# Patient Record
Sex: Male | Born: 1950 | Race: White | Hispanic: No | Marital: Married | Smoking: Former smoker
Health system: Southern US, Community
[De-identification: ages and names within clinical notes are randomized; demographics above are authoritative.]

## PROBLEM LIST (undated history)

## (undated) DIAGNOSIS — F329 Major depressive disorder, single episode, unspecified: Secondary | ICD-10-CM

## (undated) DIAGNOSIS — F32A Depression, unspecified: Secondary | ICD-10-CM

## (undated) DIAGNOSIS — E785 Hyperlipidemia, unspecified: Secondary | ICD-10-CM

## (undated) DIAGNOSIS — E119 Type 2 diabetes mellitus without complications: Secondary | ICD-10-CM

## (undated) DIAGNOSIS — G35 Multiple sclerosis: Secondary | ICD-10-CM

## (undated) DIAGNOSIS — G8929 Other chronic pain: Secondary | ICD-10-CM

## (undated) DIAGNOSIS — K219 Gastro-esophageal reflux disease without esophagitis: Secondary | ICD-10-CM

## (undated) DIAGNOSIS — I10 Essential (primary) hypertension: Secondary | ICD-10-CM

## (undated) HISTORY — DX: Hyperlipidemia, unspecified: E78.5

## (undated) HISTORY — DX: Type 2 diabetes mellitus without complications: E11.9

## (undated) HISTORY — DX: Other chronic pain: G89.29

## (undated) HISTORY — DX: Depression, unspecified: F32.A

## (undated) HISTORY — DX: Essential (primary) hypertension: I10

## (undated) HISTORY — DX: Major depressive disorder, single episode, unspecified: F32.9

## (undated) HISTORY — DX: Multiple sclerosis: G35

## (undated) HISTORY — DX: Gastro-esophageal reflux disease without esophagitis: K21.9

## (undated) MED FILL — Iron Sucrose Inj 20 MG/ML (Fe Equiv): INTRAVENOUS | Qty: 10 | Status: AC

---

## 2005-01-22 ENCOUNTER — Ambulatory Visit: Payer: Self-pay | Admitting: Family Medicine

## 2005-02-02 ENCOUNTER — Ambulatory Visit: Payer: Self-pay | Admitting: Family Medicine

## 2005-11-17 ENCOUNTER — Ambulatory Visit: Payer: Self-pay | Admitting: Psychiatry

## 2005-11-18 ENCOUNTER — Ambulatory Visit: Payer: Self-pay | Admitting: Psychiatry

## 2006-05-17 ENCOUNTER — Ambulatory Visit: Payer: Self-pay | Admitting: Psychiatry

## 2008-06-18 ENCOUNTER — Ambulatory Visit: Payer: Self-pay | Admitting: Gastroenterology

## 2008-06-25 ENCOUNTER — Other Ambulatory Visit: Payer: Self-pay

## 2008-06-25 ENCOUNTER — Inpatient Hospital Stay: Payer: Self-pay | Admitting: Internal Medicine

## 2008-06-25 ENCOUNTER — Ambulatory Visit: Payer: Self-pay | Admitting: Internal Medicine

## 2008-06-25 HISTORY — PX: COLECTOMY: SHX59

## 2008-07-25 ENCOUNTER — Ambulatory Visit: Payer: Self-pay | Admitting: Internal Medicine

## 2010-08-10 ENCOUNTER — Ambulatory Visit: Payer: Self-pay | Admitting: Nephrology

## 2010-10-14 ENCOUNTER — Inpatient Hospital Stay: Payer: Self-pay | Admitting: Internal Medicine

## 2011-11-09 DIAGNOSIS — D510 Vitamin B12 deficiency anemia due to intrinsic factor deficiency: Secondary | ICD-10-CM | POA: Diagnosis not present

## 2011-11-23 DIAGNOSIS — D509 Iron deficiency anemia, unspecified: Secondary | ICD-10-CM | POA: Diagnosis not present

## 2011-12-03 DIAGNOSIS — R269 Unspecified abnormalities of gait and mobility: Secondary | ICD-10-CM | POA: Diagnosis not present

## 2011-12-03 DIAGNOSIS — R609 Edema, unspecified: Secondary | ICD-10-CM | POA: Diagnosis not present

## 2011-12-03 DIAGNOSIS — G35 Multiple sclerosis: Secondary | ICD-10-CM | POA: Diagnosis not present

## 2011-12-07 DIAGNOSIS — D509 Iron deficiency anemia, unspecified: Secondary | ICD-10-CM | POA: Diagnosis not present

## 2011-12-17 DIAGNOSIS — D509 Iron deficiency anemia, unspecified: Secondary | ICD-10-CM | POA: Diagnosis not present

## 2011-12-17 DIAGNOSIS — R197 Diarrhea, unspecified: Secondary | ICD-10-CM | POA: Diagnosis not present

## 2011-12-17 DIAGNOSIS — E860 Dehydration: Secondary | ICD-10-CM | POA: Diagnosis not present

## 2011-12-21 DIAGNOSIS — D509 Iron deficiency anemia, unspecified: Secondary | ICD-10-CM | POA: Diagnosis not present

## 2012-01-04 DIAGNOSIS — D509 Iron deficiency anemia, unspecified: Secondary | ICD-10-CM | POA: Diagnosis not present

## 2012-01-18 DIAGNOSIS — D509 Iron deficiency anemia, unspecified: Secondary | ICD-10-CM | POA: Diagnosis not present

## 2012-02-01 DIAGNOSIS — D509 Iron deficiency anemia, unspecified: Secondary | ICD-10-CM | POA: Diagnosis not present

## 2012-02-15 DIAGNOSIS — D509 Iron deficiency anemia, unspecified: Secondary | ICD-10-CM | POA: Diagnosis not present

## 2012-02-21 DIAGNOSIS — H4010X0 Unspecified open-angle glaucoma, stage unspecified: Secondary | ICD-10-CM | POA: Diagnosis not present

## 2012-02-29 DIAGNOSIS — D509 Iron deficiency anemia, unspecified: Secondary | ICD-10-CM | POA: Diagnosis not present

## 2012-03-14 DIAGNOSIS — E119 Type 2 diabetes mellitus without complications: Secondary | ICD-10-CM | POA: Diagnosis not present

## 2012-03-14 DIAGNOSIS — E785 Hyperlipidemia, unspecified: Secondary | ICD-10-CM | POA: Diagnosis not present

## 2012-03-14 DIAGNOSIS — F329 Major depressive disorder, single episode, unspecified: Secondary | ICD-10-CM | POA: Diagnosis not present

## 2012-03-14 DIAGNOSIS — D509 Iron deficiency anemia, unspecified: Secondary | ICD-10-CM | POA: Diagnosis not present

## 2012-03-14 DIAGNOSIS — I10 Essential (primary) hypertension: Secondary | ICD-10-CM | POA: Diagnosis not present

## 2012-03-28 DIAGNOSIS — D509 Iron deficiency anemia, unspecified: Secondary | ICD-10-CM | POA: Diagnosis not present

## 2012-04-11 DIAGNOSIS — D509 Iron deficiency anemia, unspecified: Secondary | ICD-10-CM | POA: Diagnosis not present

## 2012-04-25 DIAGNOSIS — D509 Iron deficiency anemia, unspecified: Secondary | ICD-10-CM | POA: Diagnosis not present

## 2012-05-10 DIAGNOSIS — S2239XA Fracture of one rib, unspecified side, initial encounter for closed fracture: Secondary | ICD-10-CM | POA: Diagnosis not present

## 2012-05-23 DIAGNOSIS — D510 Vitamin B12 deficiency anemia due to intrinsic factor deficiency: Secondary | ICD-10-CM | POA: Diagnosis not present

## 2012-05-26 DIAGNOSIS — R269 Unspecified abnormalities of gait and mobility: Secondary | ICD-10-CM | POA: Diagnosis not present

## 2012-05-26 DIAGNOSIS — R609 Edema, unspecified: Secondary | ICD-10-CM | POA: Diagnosis not present

## 2012-05-26 DIAGNOSIS — G35 Multiple sclerosis: Secondary | ICD-10-CM | POA: Diagnosis not present

## 2012-05-29 ENCOUNTER — Other Ambulatory Visit: Payer: Self-pay | Admitting: Neurology

## 2012-05-29 DIAGNOSIS — G35 Multiple sclerosis: Secondary | ICD-10-CM

## 2012-05-29 DIAGNOSIS — R609 Edema, unspecified: Secondary | ICD-10-CM

## 2012-05-29 DIAGNOSIS — R269 Unspecified abnormalities of gait and mobility: Secondary | ICD-10-CM

## 2012-06-02 ENCOUNTER — Ambulatory Visit
Admission: RE | Admit: 2012-06-02 | Discharge: 2012-06-02 | Disposition: A | Discharge status: Home / Self Care | Payer: Medicare Other | Source: Ambulatory Visit | Attending: Neurology | Admitting: Neurology

## 2012-06-02 DIAGNOSIS — R609 Edema, unspecified: Secondary | ICD-10-CM

## 2012-06-02 DIAGNOSIS — G35 Multiple sclerosis: Secondary | ICD-10-CM

## 2012-06-02 DIAGNOSIS — R269 Unspecified abnormalities of gait and mobility: Secondary | ICD-10-CM

## 2012-06-02 DIAGNOSIS — R262 Difficulty in walking, not elsewhere classified: Secondary | ICD-10-CM | POA: Diagnosis not present

## 2012-06-02 MED ORDER — GADOBENATE DIMEGLUMINE 529 MG/ML IV SOLN
20.0000 mL | Freq: Once | INTRAVENOUS | Status: AC | PRN
  Administered 2012-06-02: 20 mL via INTRAVENOUS

## 2012-06-06 DIAGNOSIS — E538 Deficiency of other specified B group vitamins: Secondary | ICD-10-CM | POA: Diagnosis not present

## 2012-06-20 DIAGNOSIS — E538 Deficiency of other specified B group vitamins: Secondary | ICD-10-CM | POA: Diagnosis not present

## 2012-07-04 DIAGNOSIS — E538 Deficiency of other specified B group vitamins: Secondary | ICD-10-CM | POA: Diagnosis not present

## 2012-07-11 DIAGNOSIS — R609 Edema, unspecified: Secondary | ICD-10-CM | POA: Diagnosis not present

## 2012-07-11 DIAGNOSIS — D649 Anemia, unspecified: Secondary | ICD-10-CM | POA: Diagnosis not present

## 2012-07-11 DIAGNOSIS — E875 Hyperkalemia: Secondary | ICD-10-CM | POA: Diagnosis not present

## 2012-07-18 DIAGNOSIS — E538 Deficiency of other specified B group vitamins: Secondary | ICD-10-CM | POA: Diagnosis not present

## 2012-07-27 DIAGNOSIS — R269 Unspecified abnormalities of gait and mobility: Secondary | ICD-10-CM | POA: Diagnosis not present

## 2012-07-27 DIAGNOSIS — G35 Multiple sclerosis: Secondary | ICD-10-CM | POA: Diagnosis not present

## 2012-07-31 DIAGNOSIS — D509 Iron deficiency anemia, unspecified: Secondary | ICD-10-CM | POA: Diagnosis not present

## 2012-07-31 DIAGNOSIS — J209 Acute bronchitis, unspecified: Secondary | ICD-10-CM | POA: Diagnosis not present

## 2012-08-15 DIAGNOSIS — Z23 Encounter for immunization: Secondary | ICD-10-CM | POA: Diagnosis not present

## 2012-08-15 DIAGNOSIS — D510 Vitamin B12 deficiency anemia due to intrinsic factor deficiency: Secondary | ICD-10-CM | POA: Diagnosis not present

## 2012-08-29 DIAGNOSIS — D509 Iron deficiency anemia, unspecified: Secondary | ICD-10-CM | POA: Diagnosis not present

## 2012-09-12 ENCOUNTER — Ambulatory Visit: Payer: Self-pay | Admitting: Family Medicine

## 2012-09-12 DIAGNOSIS — S2249XA Multiple fractures of ribs, unspecified side, initial encounter for closed fracture: Secondary | ICD-10-CM | POA: Diagnosis not present

## 2012-09-12 DIAGNOSIS — S2239XA Fracture of one rib, unspecified side, initial encounter for closed fracture: Secondary | ICD-10-CM | POA: Diagnosis not present

## 2012-09-12 DIAGNOSIS — Z043 Encounter for examination and observation following other accident: Secondary | ICD-10-CM | POA: Diagnosis not present

## 2012-09-26 DIAGNOSIS — D509 Iron deficiency anemia, unspecified: Secondary | ICD-10-CM | POA: Diagnosis not present

## 2012-10-09 DIAGNOSIS — H40009 Preglaucoma, unspecified, unspecified eye: Secondary | ICD-10-CM | POA: Diagnosis not present

## 2012-10-10 DIAGNOSIS — D509 Iron deficiency anemia, unspecified: Secondary | ICD-10-CM | POA: Diagnosis not present

## 2012-10-23 DIAGNOSIS — J209 Acute bronchitis, unspecified: Secondary | ICD-10-CM | POA: Diagnosis not present

## 2012-10-23 DIAGNOSIS — J329 Chronic sinusitis, unspecified: Secondary | ICD-10-CM | POA: Diagnosis not present

## 2012-10-23 DIAGNOSIS — D509 Iron deficiency anemia, unspecified: Secondary | ICD-10-CM | POA: Diagnosis not present

## 2012-11-07 DIAGNOSIS — D509 Iron deficiency anemia, unspecified: Secondary | ICD-10-CM | POA: Diagnosis not present

## 2012-11-21 DIAGNOSIS — D509 Iron deficiency anemia, unspecified: Secondary | ICD-10-CM | POA: Diagnosis not present

## 2012-12-19 DIAGNOSIS — D509 Iron deficiency anemia, unspecified: Secondary | ICD-10-CM | POA: Diagnosis not present

## 2013-01-16 DIAGNOSIS — D509 Iron deficiency anemia, unspecified: Secondary | ICD-10-CM | POA: Diagnosis not present

## 2013-01-23 DIAGNOSIS — J329 Chronic sinusitis, unspecified: Secondary | ICD-10-CM | POA: Diagnosis not present

## 2013-01-30 DIAGNOSIS — E538 Deficiency of other specified B group vitamins: Secondary | ICD-10-CM | POA: Diagnosis not present

## 2013-02-13 DIAGNOSIS — E538 Deficiency of other specified B group vitamins: Secondary | ICD-10-CM | POA: Diagnosis not present

## 2013-02-27 DIAGNOSIS — E538 Deficiency of other specified B group vitamins: Secondary | ICD-10-CM | POA: Diagnosis not present

## 2013-03-13 DIAGNOSIS — E538 Deficiency of other specified B group vitamins: Secondary | ICD-10-CM | POA: Diagnosis not present

## 2013-03-22 DIAGNOSIS — L03119 Cellulitis of unspecified part of limb: Secondary | ICD-10-CM | POA: Diagnosis not present

## 2013-03-22 DIAGNOSIS — L02419 Cutaneous abscess of limb, unspecified: Secondary | ICD-10-CM | POA: Diagnosis not present

## 2013-03-22 DIAGNOSIS — L259 Unspecified contact dermatitis, unspecified cause: Secondary | ICD-10-CM | POA: Diagnosis not present

## 2013-03-26 DIAGNOSIS — L03119 Cellulitis of unspecified part of limb: Secondary | ICD-10-CM | POA: Diagnosis not present

## 2013-04-09 DIAGNOSIS — H40009 Preglaucoma, unspecified, unspecified eye: Secondary | ICD-10-CM | POA: Diagnosis not present

## 2013-04-10 DIAGNOSIS — D509 Iron deficiency anemia, unspecified: Secondary | ICD-10-CM | POA: Diagnosis not present

## 2013-04-12 DIAGNOSIS — E785 Hyperlipidemia, unspecified: Secondary | ICD-10-CM | POA: Diagnosis not present

## 2013-04-12 DIAGNOSIS — E538 Deficiency of other specified B group vitamins: Secondary | ICD-10-CM | POA: Diagnosis not present

## 2013-04-12 DIAGNOSIS — F329 Major depressive disorder, single episode, unspecified: Secondary | ICD-10-CM | POA: Diagnosis not present

## 2013-04-12 DIAGNOSIS — D509 Iron deficiency anemia, unspecified: Secondary | ICD-10-CM | POA: Diagnosis not present

## 2013-04-12 DIAGNOSIS — E119 Type 2 diabetes mellitus without complications: Secondary | ICD-10-CM | POA: Diagnosis not present

## 2013-04-12 DIAGNOSIS — I10 Essential (primary) hypertension: Secondary | ICD-10-CM | POA: Diagnosis not present

## 2013-04-17 DIAGNOSIS — D649 Anemia, unspecified: Secondary | ICD-10-CM | POA: Diagnosis not present

## 2013-04-17 DIAGNOSIS — N182 Chronic kidney disease, stage 2 (mild): Secondary | ICD-10-CM | POA: Diagnosis not present

## 2013-04-17 DIAGNOSIS — R609 Edema, unspecified: Secondary | ICD-10-CM | POA: Diagnosis not present

## 2013-04-17 DIAGNOSIS — I270 Primary pulmonary hypertension: Secondary | ICD-10-CM | POA: Diagnosis not present

## 2013-04-24 DIAGNOSIS — D509 Iron deficiency anemia, unspecified: Secondary | ICD-10-CM | POA: Diagnosis not present

## 2013-04-24 DIAGNOSIS — E538 Deficiency of other specified B group vitamins: Secondary | ICD-10-CM | POA: Diagnosis not present

## 2013-05-02 DIAGNOSIS — IMO0002 Reserved for concepts with insufficient information to code with codable children: Secondary | ICD-10-CM | POA: Diagnosis not present

## 2013-05-02 DIAGNOSIS — D509 Iron deficiency anemia, unspecified: Secondary | ICD-10-CM | POA: Diagnosis not present

## 2013-05-03 DIAGNOSIS — G479 Sleep disorder, unspecified: Secondary | ICD-10-CM | POA: Diagnosis not present

## 2013-05-08 DIAGNOSIS — E538 Deficiency of other specified B group vitamins: Secondary | ICD-10-CM | POA: Diagnosis not present

## 2013-05-10 DIAGNOSIS — D509 Iron deficiency anemia, unspecified: Secondary | ICD-10-CM | POA: Diagnosis not present

## 2013-05-14 DIAGNOSIS — E119 Type 2 diabetes mellitus without complications: Secondary | ICD-10-CM | POA: Diagnosis not present

## 2013-05-14 DIAGNOSIS — IMO0002 Reserved for concepts with insufficient information to code with codable children: Secondary | ICD-10-CM | POA: Diagnosis not present

## 2013-05-14 DIAGNOSIS — D509 Iron deficiency anemia, unspecified: Secondary | ICD-10-CM | POA: Diagnosis not present

## 2013-05-15 ENCOUNTER — Ambulatory Visit: Payer: Self-pay | Admitting: Gastroenterology

## 2013-05-15 DIAGNOSIS — F3289 Other specified depressive episodes: Secondary | ICD-10-CM | POA: Diagnosis not present

## 2013-05-15 DIAGNOSIS — Z8249 Family history of ischemic heart disease and other diseases of the circulatory system: Secondary | ICD-10-CM | POA: Diagnosis not present

## 2013-05-15 DIAGNOSIS — D649 Anemia, unspecified: Secondary | ICD-10-CM | POA: Diagnosis not present

## 2013-05-15 DIAGNOSIS — D509 Iron deficiency anemia, unspecified: Secondary | ICD-10-CM | POA: Diagnosis not present

## 2013-05-15 DIAGNOSIS — G35 Multiple sclerosis: Secondary | ICD-10-CM | POA: Diagnosis not present

## 2013-05-15 DIAGNOSIS — Z7982 Long term (current) use of aspirin: Secondary | ICD-10-CM | POA: Diagnosis not present

## 2013-05-15 DIAGNOSIS — K2991 Gastroduodenitis, unspecified, with bleeding: Secondary | ICD-10-CM | POA: Diagnosis not present

## 2013-05-15 DIAGNOSIS — K2970 Gastritis, unspecified, without bleeding: Secondary | ICD-10-CM | POA: Diagnosis not present

## 2013-05-15 DIAGNOSIS — E785 Hyperlipidemia, unspecified: Secondary | ICD-10-CM | POA: Diagnosis not present

## 2013-05-15 DIAGNOSIS — Z87891 Personal history of nicotine dependence: Secondary | ICD-10-CM | POA: Diagnosis not present

## 2013-05-15 DIAGNOSIS — E1149 Type 2 diabetes mellitus with other diabetic neurological complication: Secondary | ICD-10-CM | POA: Diagnosis not present

## 2013-05-15 DIAGNOSIS — Z79899 Other long term (current) drug therapy: Secondary | ICD-10-CM | POA: Diagnosis not present

## 2013-05-15 DIAGNOSIS — J309 Allergic rhinitis, unspecified: Secondary | ICD-10-CM | POA: Diagnosis not present

## 2013-05-15 DIAGNOSIS — Z9109 Other allergy status, other than to drugs and biological substances: Secondary | ICD-10-CM | POA: Diagnosis not present

## 2013-05-15 DIAGNOSIS — Z833 Family history of diabetes mellitus: Secondary | ICD-10-CM | POA: Diagnosis not present

## 2013-05-15 DIAGNOSIS — K648 Other hemorrhoids: Secondary | ICD-10-CM | POA: Diagnosis not present

## 2013-05-15 DIAGNOSIS — K2971 Gastritis, unspecified, with bleeding: Secondary | ICD-10-CM | POA: Diagnosis not present

## 2013-05-15 DIAGNOSIS — K449 Diaphragmatic hernia without obstruction or gangrene: Secondary | ICD-10-CM | POA: Diagnosis not present

## 2013-05-15 DIAGNOSIS — Z801 Family history of malignant neoplasm of trachea, bronchus and lung: Secondary | ICD-10-CM | POA: Diagnosis not present

## 2013-05-16 LAB — PATHOLOGY REPORT

## 2013-05-22 DIAGNOSIS — E538 Deficiency of other specified B group vitamins: Secondary | ICD-10-CM | POA: Diagnosis not present

## 2013-06-05 DIAGNOSIS — D509 Iron deficiency anemia, unspecified: Secondary | ICD-10-CM | POA: Diagnosis not present

## 2013-06-05 DIAGNOSIS — E538 Deficiency of other specified B group vitamins: Secondary | ICD-10-CM | POA: Diagnosis not present

## 2013-06-08 DIAGNOSIS — D61818 Other pancytopenia: Secondary | ICD-10-CM | POA: Diagnosis not present

## 2013-06-19 DIAGNOSIS — E538 Deficiency of other specified B group vitamins: Secondary | ICD-10-CM | POA: Diagnosis not present

## 2013-07-02 DIAGNOSIS — R609 Edema, unspecified: Secondary | ICD-10-CM | POA: Diagnosis not present

## 2013-07-02 DIAGNOSIS — E538 Deficiency of other specified B group vitamins: Secondary | ICD-10-CM | POA: Diagnosis not present

## 2013-07-02 DIAGNOSIS — J329 Chronic sinusitis, unspecified: Secondary | ICD-10-CM | POA: Diagnosis not present

## 2013-07-02 DIAGNOSIS — J45909 Unspecified asthma, uncomplicated: Secondary | ICD-10-CM | POA: Diagnosis not present

## 2013-07-17 DIAGNOSIS — E538 Deficiency of other specified B group vitamins: Secondary | ICD-10-CM | POA: Diagnosis not present

## 2013-07-17 DIAGNOSIS — D509 Iron deficiency anemia, unspecified: Secondary | ICD-10-CM | POA: Diagnosis not present

## 2013-07-21 ENCOUNTER — Encounter: Payer: Self-pay | Admitting: Nurse Practitioner

## 2013-07-23 ENCOUNTER — Other Ambulatory Visit: Payer: Self-pay | Admitting: Neurology

## 2013-07-25 NOTE — Telephone Encounter (Signed)
Rx signed and faxed.

## 2013-07-26 ENCOUNTER — Encounter: Payer: Self-pay | Admitting: Nurse Practitioner

## 2013-07-26 ENCOUNTER — Ambulatory Visit (INDEPENDENT_AMBULATORY_CARE_PROVIDER_SITE_OTHER): Payer: Medicare Other | Admitting: Nurse Practitioner

## 2013-07-26 VITALS — BP 132/63 | HR 69 | Ht 70.0 in | Wt 284.0 lb

## 2013-07-26 DIAGNOSIS — G35 Multiple sclerosis: Secondary | ICD-10-CM | POA: Insufficient documentation

## 2013-07-26 DIAGNOSIS — R269 Unspecified abnormalities of gait and mobility: Secondary | ICD-10-CM | POA: Insufficient documentation

## 2013-07-26 DIAGNOSIS — G35D Multiple sclerosis, unspecified: Secondary | ICD-10-CM | POA: Insufficient documentation

## 2013-07-26 NOTE — Progress Notes (Signed)
[facility]  PATIENT: Jeremy [name] DOB: Oct 15, 1951   REASON FOR VISIT: Followup for multiple sclerosis   HISTORY OF PRESENT ILLNESS:. Jeremy [name] is a 62 year old, right handed, Caucasian male with history of  diabetes, hypertension and multiple sclerosis.  The diagnosis of multiple sclerosis was confirmed by MRI study and a spinal fluid testing.  He was initially followed by Dr. Nash Shearer in 2008.Last seen in this office 07/27/12. He continues to have gait difficulties and relies on a cane for ambulation.  He has had no problems with his vision or bowel/bladder control.He has had occasional falls.He has ongoing pain in his legs. Last MS flare was over 2 years ago. He is currently on Betaseron every other day, treatment started in 2007. MRI last year  of cervical spine with findings consistent with MS plaques and no enhancement Mild spinal stenosis. Marland KitchenMRI of the brain with prominent white matter changes consistent with MS. No active areas of demyelination. He continues to have problems with low hemoglobin and is due to see a hematologist. Recent labs to include CBC and CMP at primary care   HISTORY: The initial symptom was left leg more than arm weakness, gait difficulty, with acute onset in 2006.  This eventually led to the diagnosis of relapsing remitting multiple sclerosis. He has been receiving Betaseron treatment since early 2007.  He has tolerated the medication well.  He has had no problems with lipoatrophy or skin necrosis. In September 2009, he developed GI bleeding.  Workup had demonstrated colon inflammation and obstruction.He underwent colectomy and recovered well from that procedure.  He continues to have gait difficulties and relies on a cane for ambulation.  He has had no problems with his vision or bowel/bladder control. He does have chronic extremity pain, which has been managed with Lyrica and Tramadol. He uses an Art gallery manager for situations in which he would need  to walk long distances. He has had occasional falls.He has ongoing pain in his legs.Last flare up was 13 months ago, He had increased gait difficulty then, he is now back to his baseline, functional level has been fairly stable since his onset, he is using Betaseron, Medicine assistant program, very happy about the current treatment, no severe skin reaction of flulike illness  UPDATE 05/26/2012:He is accompanied by his wife, reported frequent falling recently, felt twice over the past few months, one night getting up using the bathroom, he felt chilled all over, fell to the ground, no loss of consciousness, his legs just give out underneath, he overall had slight worsening gait difficulty, he denies incontinence, 07/27/12: Returns for followup. Has not fallen since last visit. Made aware of MRI results of cervical spine with findings consistent with MS plaques and no enhancement Mild spinal stenosis. Marland KitchenMRI of the brain with prominent white matter changes consistent with MS. No active areas of demyelination. Denies injection site problems. Using cane to ambulate, no new neurologic complaints.     REVIEW OF SYSTEMS: Full 14 system review of systems performed and notable only for:  Constitutional: Fatigue  Cardiovascular: Swelling in the legs Ear/Nose/Throat: N/A  Skin: N/A  Eyes: N/A  Respiratory: N/A  Gastroitestinal: N/A  Hematology/Lymphatic: Anemia  Endocrine: N/A Musculoskeletal: Joint pain Allergy/Immunology: N/A  Neurological: Numbness weakness  Psychiatric: Decreased energy   ALLERGIES: Allergies  Allergen Reactions  . Betadine [Povidone Iodine]     HOME MEDICATIONS: Outpatient Prescriptions Prior to Visit  Medication Sig Dispense Refill  . aspirin 81 MG tablet Take 81 mg by  mouth daily.      . cyanocobalamin 1000 MCG tablet Take 100 mcg by mouth daily.      . Ferrous Sulfate (IRON SUPPLEMENT PO) Take by mouth 3 (three) times daily.      . Furosemide (LASIX PO) Take by mouth.       Marland Kitchen gemfibrozil (LOPID) 600 MG tablet Take 600 mg by mouth 2 (two) times daily before a meal.      . glipiZIDE (GLUCOTROL XL) 2.5 MG 24 hr tablet Take 2.5 mg by mouth daily.      . Interferon Beta-1b (BETASERON) 0.3 MG KIT injection Inject 0.25 mg into the skin every other day.      . metFORMIN (GLUCOPHAGE) 1000 MG tablet Take 1,000 mg by mouth 2 (two) times daily with a meal.      . metoprolol tartrate (LOPRESSOR) 25 MG tablet Take 25 mg by mouth 2 (two) times daily.      . pioglitazone (ACTOS) 30 MG tablet Take 30 mg by mouth 2 (two) times daily.      . pregabalin (LYRICA) 150 MG capsule Take 150 mg by mouth 2 (two) times daily.      . sertraline (ZOLOFT) 50 MG tablet Take 50 mg by mouth daily.      . simvastatin (ZOCOR) 40 MG tablet Take 40 mg by mouth every evening.      . traMADol (ULTRAM) 50 MG tablet TAKE 1 TABLET BY MOUTH EVERY 6 HOURS AS NEEDED FOR PAIN  120 tablet  0   No facility-administered medications prior to visit.    PAST MEDICAL HISTORY: Past Medical History  Diagnosis Date  . Depression   . Diabetes   . Hyperlipemia   . Hypertension   . Chronic pain   . MS (multiple sclerosis)     PAST SURGICAL HISTORY: Past Surgical History  Procedure Laterality Date  . Colectomy  06-2008    FAMILY HISTORY: Family History  Problem Relation Age of Onset  . Cancer Mother     SOCIAL HISTORY: History   Social History  . Marital Status: Married    Spouse Name: Jeremy [name]    Number of Children: 2  . Years of Education: GED   Occupational History  . Not on file.   Social History Main Topics  . Smoking status: Former Smoker    Quit date: 01/23/2006  . Smokeless tobacco: Never Used  . Alcohol Use: Yes     Comment: rare  . Drug Use: No  . Sexual Activity: Not on file   Other Topics Concern  . Not on file   Social History Narrative   Patient is disabled.    Patient lives with his wife Jeremy [name].    Patient has 2 children.            PHYSICAL EXAM  Filed  Vitals:   07/26/13 1441  BP: 132/63  Pulse: 69  Height: 5\' 10"  (1.778 m)  Weight: 284 lb (128.822 kg)   Body mass index is 40.75 kg/(m^2).  Generalized: Well developed, obese male in no acute distress  Head: normocephalic and atraumatic,. Oropharynx benign  Neck: Supple, no carotid bruits  Cardiac: Regular rate rhythm, no murmur  Musculoskeletal: Mild left hemiparesis Neurological examination   Mentation: Alert oriented to time, place, history taking. Follows all commands speech and language fluent  Cranial nerve II-XII: .Pupils were equal round reactive to light extraocular movements were full, visual field were full on confrontational test. Facial sensation and strength were normal.  hearing was intact to finger rubbing bilaterally. Uvula tongue midline. head turning and shoulder shrug and were normal and symmetric.Tongue protrusion into cheek strength was normal. Motor: Mild left-sided weakness  Coordination: finger-nose-finger, heel-to-shin bilaterally, no dysmetria Reflexes: 1+ and symmetric upper and lower  Gait and Station: Left hemi-spastic gait, wide based, ambulates with a cane   DIAGNOSTIC DATA (LABS, IMAGING, TESTING) -None to review ASSESSMENT AND PLAN  62 y.o. year old male  has a past medical history of Depression; Diabetes; Hyperlipemia; Hypertension; Chronic pain; and MS (multiple sclerosis). here to followup patient is currently stable on Betaseron with recent liver function test primary care within normal limits. MRI last year  of cervical spine with findings consistent with MS plaques and no enhancement Mild spinal stenosis. Marland KitchenMRI of the brain with prominent white matter changes consistent with MS. No active areas of demyelination.  Pt to continue Betaseron Discussed Ampyra and given patient information to read. He will call back if interested. F/U yearly  Nilda Riggs, [facility], [facility], APRN  [facility] [address] [place],  [place] [address] [phone]

## 2013-07-26 NOTE — Patient Instructions (Addendum)
Pt to continue Betaseron Recent labs normal except low hemaglobin F/U yearly

## 2013-07-31 DIAGNOSIS — D649 Anemia, unspecified: Secondary | ICD-10-CM | POA: Diagnosis not present

## 2013-07-31 DIAGNOSIS — D509 Iron deficiency anemia, unspecified: Secondary | ICD-10-CM | POA: Diagnosis not present

## 2013-08-06 ENCOUNTER — Telehealth: Payer: Self-pay | Admitting: Nurse Practitioner

## 2013-08-06 NOTE — Telephone Encounter (Signed)
TC to DR Yetta Barre after discussing with Dr. Terrace Arabia. She suggest either GI or hematology consult. Dr. Yetta Barre has already made referral to Hematology.

## 2013-08-08 ENCOUNTER — Ambulatory Visit: Payer: Self-pay | Admitting: Internal Medicine

## 2013-08-08 DIAGNOSIS — E119 Type 2 diabetes mellitus without complications: Secondary | ICD-10-CM | POA: Diagnosis not present

## 2013-08-08 DIAGNOSIS — Z7982 Long term (current) use of aspirin: Secondary | ICD-10-CM | POA: Diagnosis not present

## 2013-08-08 DIAGNOSIS — I10 Essential (primary) hypertension: Secondary | ICD-10-CM | POA: Diagnosis not present

## 2013-08-08 DIAGNOSIS — E7800 Pure hypercholesterolemia, unspecified: Secondary | ICD-10-CM | POA: Diagnosis not present

## 2013-08-08 DIAGNOSIS — Z79899 Other long term (current) drug therapy: Secondary | ICD-10-CM | POA: Diagnosis not present

## 2013-08-08 DIAGNOSIS — R5381 Other malaise: Secondary | ICD-10-CM | POA: Diagnosis not present

## 2013-08-08 DIAGNOSIS — M7989 Other specified soft tissue disorders: Secondary | ICD-10-CM | POA: Diagnosis not present

## 2013-08-08 DIAGNOSIS — Z801 Family history of malignant neoplasm of trachea, bronchus and lung: Secondary | ICD-10-CM | POA: Diagnosis not present

## 2013-08-08 DIAGNOSIS — K219 Gastro-esophageal reflux disease without esophagitis: Secondary | ICD-10-CM | POA: Diagnosis not present

## 2013-08-08 DIAGNOSIS — D61818 Other pancytopenia: Secondary | ICD-10-CM | POA: Diagnosis not present

## 2013-08-08 DIAGNOSIS — E538 Deficiency of other specified B group vitamins: Secondary | ICD-10-CM | POA: Diagnosis not present

## 2013-08-08 DIAGNOSIS — G35 Multiple sclerosis: Secondary | ICD-10-CM | POA: Diagnosis not present

## 2013-08-08 LAB — CBC CANCER CENTER
Basophil %: 1.4 %
Eosinophil #: 0.2 x10 3/mm (ref 0.0–0.7)
Eosinophil %: 4.4 %
HGB: 8.9 g/dL — ABNORMAL LOW (ref 13.0–18.0)
Lymphocyte #: 0.7 x10 3/mm — ABNORMAL LOW (ref 1.0–3.6)
MCH: 32.6 pg (ref 26.0–34.0)
MCHC: 32.5 g/dL (ref 32.0–36.0)
Monocyte #: 0.6 x10 3/mm (ref 0.2–1.0)
Monocyte %: 15.2 %
Neutrophil #: 2.3 x10 3/mm (ref 1.4–6.5)
Neutrophil %: 60.1 %
RDW: 16.4 % — ABNORMAL HIGH (ref 11.5–14.5)

## 2013-08-08 LAB — RETICULOCYTES: Absolute Retic Count: 0.088 10*6/uL (ref 0.019–0.186)

## 2013-08-08 LAB — LACTATE DEHYDROGENASE: LDH: 189 U/L (ref 85–241)

## 2013-08-08 LAB — FOLATE: Folic Acid: 11.5 ng/mL (ref 3.1–100.0)

## 2013-08-10 LAB — PROT IMMUNOELECTROPHORES(ARMC)

## 2013-08-14 DIAGNOSIS — E538 Deficiency of other specified B group vitamins: Secondary | ICD-10-CM | POA: Diagnosis not present

## 2013-08-20 DIAGNOSIS — M7989 Other specified soft tissue disorders: Secondary | ICD-10-CM | POA: Diagnosis not present

## 2013-08-20 DIAGNOSIS — E538 Deficiency of other specified B group vitamins: Secondary | ICD-10-CM | POA: Diagnosis not present

## 2013-08-20 DIAGNOSIS — G35 Multiple sclerosis: Secondary | ICD-10-CM | POA: Diagnosis not present

## 2013-08-20 DIAGNOSIS — D61818 Other pancytopenia: Secondary | ICD-10-CM | POA: Diagnosis not present

## 2013-08-20 DIAGNOSIS — R5381 Other malaise: Secondary | ICD-10-CM | POA: Diagnosis not present

## 2013-08-20 DIAGNOSIS — E119 Type 2 diabetes mellitus without complications: Secondary | ICD-10-CM | POA: Diagnosis not present

## 2013-08-20 LAB — CBC CANCER CENTER
Basophil #: 0 x10 3/mm (ref 0.0–0.1)
Eosinophil %: 4.1 %
HCT: 27 % — ABNORMAL LOW (ref 40.0–52.0)
Lymphocyte #: 1.1 x10 3/mm (ref 1.0–3.6)
MCH: 32.9 pg (ref 26.0–34.0)
MCHC: 32.4 g/dL (ref 32.0–36.0)
MCV: 101 fL — ABNORMAL HIGH (ref 80–100)
Monocyte #: 0.8 x10 3/mm (ref 0.2–1.0)
Monocyte %: 17.8 %
Neutrophil #: 2.2 x10 3/mm (ref 1.4–6.5)
Neutrophil %: 51.4 %
RBC: 2.66 10*6/uL — ABNORMAL LOW (ref 4.40–5.90)
RDW: 15.8 % — ABNORMAL HIGH (ref 11.5–14.5)

## 2013-08-22 ENCOUNTER — Ambulatory Visit: Payer: Self-pay | Admitting: Family Medicine

## 2013-08-22 DIAGNOSIS — IMO0002 Reserved for concepts with insufficient information to code with codable children: Secondary | ICD-10-CM | POA: Diagnosis not present

## 2013-08-22 DIAGNOSIS — S298XXA Other specified injuries of thorax, initial encounter: Secondary | ICD-10-CM | POA: Diagnosis not present

## 2013-08-22 DIAGNOSIS — R079 Chest pain, unspecified: Secondary | ICD-10-CM | POA: Diagnosis not present

## 2013-08-22 DIAGNOSIS — R918 Other nonspecific abnormal finding of lung field: Secondary | ICD-10-CM | POA: Diagnosis not present

## 2013-08-23 DIAGNOSIS — D61818 Other pancytopenia: Secondary | ICD-10-CM | POA: Diagnosis not present

## 2013-08-23 DIAGNOSIS — G35 Multiple sclerosis: Secondary | ICD-10-CM | POA: Diagnosis not present

## 2013-08-23 DIAGNOSIS — E538 Deficiency of other specified B group vitamins: Secondary | ICD-10-CM | POA: Diagnosis not present

## 2013-08-23 DIAGNOSIS — M7989 Other specified soft tissue disorders: Secondary | ICD-10-CM | POA: Diagnosis not present

## 2013-08-23 DIAGNOSIS — R5381 Other malaise: Secondary | ICD-10-CM | POA: Diagnosis not present

## 2013-08-23 DIAGNOSIS — E119 Type 2 diabetes mellitus without complications: Secondary | ICD-10-CM | POA: Diagnosis not present

## 2013-08-23 LAB — CREATININE, SERUM
Creatinine: 2.13 mg/dL — ABNORMAL HIGH (ref 0.60–1.30)
EGFR (African American): 38 — ABNORMAL LOW

## 2013-08-23 LAB — FIBRINOGEN: Fibrinogen: 295 mg/dL (ref 210–470)

## 2013-08-23 LAB — PROTIME-INR
INR: 1.1
Prothrombin Time: 14.6 secs (ref 11.5–14.7)

## 2013-08-25 ENCOUNTER — Ambulatory Visit: Payer: Self-pay | Admitting: Internal Medicine

## 2013-08-27 ENCOUNTER — Inpatient Hospital Stay: Payer: Self-pay | Admitting: Internal Medicine

## 2013-08-27 DIAGNOSIS — K589 Irritable bowel syndrome without diarrhea: Secondary | ICD-10-CM | POA: Diagnosis present

## 2013-08-27 DIAGNOSIS — E162 Hypoglycemia, unspecified: Secondary | ICD-10-CM | POA: Diagnosis not present

## 2013-08-27 DIAGNOSIS — R5381 Other malaise: Secondary | ICD-10-CM | POA: Diagnosis not present

## 2013-08-27 DIAGNOSIS — I369 Nonrheumatic tricuspid valve disorder, unspecified: Secondary | ICD-10-CM | POA: Diagnosis not present

## 2013-08-27 DIAGNOSIS — N179 Acute kidney failure, unspecified: Secondary | ICD-10-CM | POA: Diagnosis not present

## 2013-08-27 DIAGNOSIS — E1169 Type 2 diabetes mellitus with other specified complication: Secondary | ICD-10-CM | POA: Diagnosis not present

## 2013-08-27 DIAGNOSIS — I509 Heart failure, unspecified: Secondary | ICD-10-CM | POA: Diagnosis present

## 2013-08-27 DIAGNOSIS — E669 Obesity, unspecified: Secondary | ICD-10-CM | POA: Diagnosis present

## 2013-08-27 DIAGNOSIS — D649 Anemia, unspecified: Secondary | ICD-10-CM | POA: Diagnosis not present

## 2013-08-27 DIAGNOSIS — I129 Hypertensive chronic kidney disease with stage 1 through stage 4 chronic kidney disease, or unspecified chronic kidney disease: Secondary | ICD-10-CM | POA: Diagnosis present

## 2013-08-27 DIAGNOSIS — E1149 Type 2 diabetes mellitus with other diabetic neurological complication: Secondary | ICD-10-CM | POA: Diagnosis not present

## 2013-08-27 DIAGNOSIS — Z7902 Long term (current) use of antithrombotics/antiplatelets: Secondary | ICD-10-CM | POA: Diagnosis not present

## 2013-08-27 DIAGNOSIS — I270 Primary pulmonary hypertension: Secondary | ICD-10-CM | POA: Diagnosis not present

## 2013-08-27 DIAGNOSIS — M129 Arthropathy, unspecified: Secondary | ICD-10-CM | POA: Diagnosis present

## 2013-08-27 DIAGNOSIS — D61818 Other pancytopenia: Secondary | ICD-10-CM | POA: Diagnosis present

## 2013-08-27 DIAGNOSIS — R609 Edema, unspecified: Secondary | ICD-10-CM | POA: Diagnosis not present

## 2013-08-27 DIAGNOSIS — Z801 Family history of malignant neoplasm of trachea, bronchus and lung: Secondary | ICD-10-CM | POA: Diagnosis not present

## 2013-08-27 DIAGNOSIS — Z6841 Body Mass Index (BMI) 40.0 and over, adult: Secondary | ICD-10-CM | POA: Diagnosis not present

## 2013-08-27 DIAGNOSIS — R7309 Other abnormal glucose: Secondary | ICD-10-CM | POA: Diagnosis not present

## 2013-08-27 DIAGNOSIS — Z800 Family history of malignant neoplasm of digestive organs: Secondary | ICD-10-CM | POA: Diagnosis not present

## 2013-08-27 DIAGNOSIS — R748 Abnormal levels of other serum enzymes: Secondary | ICD-10-CM | POA: Diagnosis not present

## 2013-08-27 DIAGNOSIS — E1142 Type 2 diabetes mellitus with diabetic polyneuropathy: Secondary | ICD-10-CM | POA: Diagnosis present

## 2013-08-27 DIAGNOSIS — E785 Hyperlipidemia, unspecified: Secondary | ICD-10-CM | POA: Diagnosis present

## 2013-08-27 DIAGNOSIS — N183 Chronic kidney disease, stage 3 unspecified: Secondary | ICD-10-CM | POA: Diagnosis not present

## 2013-08-27 DIAGNOSIS — Z9049 Acquired absence of other specified parts of digestive tract: Secondary | ICD-10-CM | POA: Diagnosis not present

## 2013-08-27 DIAGNOSIS — Z9181 History of falling: Secondary | ICD-10-CM | POA: Diagnosis not present

## 2013-08-27 DIAGNOSIS — D696 Thrombocytopenia, unspecified: Secondary | ICD-10-CM | POA: Diagnosis present

## 2013-08-27 DIAGNOSIS — I2789 Other specified pulmonary heart diseases: Secondary | ICD-10-CM | POA: Diagnosis present

## 2013-08-27 DIAGNOSIS — G35 Multiple sclerosis: Secondary | ICD-10-CM | POA: Diagnosis present

## 2013-08-27 DIAGNOSIS — E119 Type 2 diabetes mellitus without complications: Secondary | ICD-10-CM | POA: Diagnosis not present

## 2013-08-27 DIAGNOSIS — R404 Transient alteration of awareness: Secondary | ICD-10-CM | POA: Diagnosis not present

## 2013-08-27 DIAGNOSIS — Z7982 Long term (current) use of aspirin: Secondary | ICD-10-CM | POA: Diagnosis not present

## 2013-08-27 LAB — URINALYSIS, COMPLETE
Bacteria: NONE SEEN
Blood: NEGATIVE
Hyaline Cast: 9
Ketone: NEGATIVE
Leukocyte Esterase: NEGATIVE
Nitrite: NEGATIVE
Ph: 5 (ref 4.5–8.0)
Protein: 30
RBC,UR: <1 /HPF (ref 0–5)
Squamous Epithelial: <1
WBC UR: 1 /HPF (ref 0–5)

## 2013-08-27 LAB — COMPREHENSIVE METABOLIC PANEL
Alkaline Phosphatase: 84 U/L (ref 50–136)
Chloride: 95 mmol/L — ABNORMAL LOW (ref 98–107)
Co2: 25 mmol/L (ref 21–32)
Creatinine: 4.4 mg/dL — ABNORMAL HIGH (ref 0.60–1.30)
EGFR (African American): 16 — ABNORMAL LOW
EGFR (Non-African Amer.): 13 — ABNORMAL LOW
Glucose: 65 mg/dL (ref 65–99)
Osmolality: 276 (ref 275–301)
SGOT(AST): 60 U/L — ABNORMAL HIGH (ref 15–37)
Total Protein: 7.7 g/dL (ref 6.4–8.2)

## 2013-08-27 LAB — TROPONIN I
Troponin-I: 0.12 ng/mL — ABNORMAL HIGH
Troponin-I: 0.1 ng/mL — ABNORMAL HIGH

## 2013-08-27 LAB — CBC
HCT: 26.2 % — ABNORMAL LOW (ref 40.0–52.0)
HGB: 8.9 g/dL — ABNORMAL LOW (ref 13.0–18.0)
MCH: 33.5 pg (ref 26.0–34.0)
MCHC: 33.9 g/dL (ref 32.0–36.0)
MCV: 99 fL (ref 80–100)
RDW: 16 % — ABNORMAL HIGH (ref 11.5–14.5)
WBC: 5.3 10*3/uL (ref 3.8–10.6)

## 2013-08-27 LAB — PRO B NATRIURETIC PEPTIDE: B-Type Natriuretic Peptide: 2880 pg/mL — ABNORMAL HIGH (ref 0–125)

## 2013-08-28 DIAGNOSIS — I369 Nonrheumatic tricuspid valve disorder, unspecified: Secondary | ICD-10-CM

## 2013-08-28 LAB — CK: CK, Total: 816 U/L — ABNORMAL HIGH

## 2013-08-28 LAB — BASIC METABOLIC PANEL WITH GFR
Anion Gap: 6 — ABNORMAL LOW
BUN: 60 mg/dL — ABNORMAL HIGH
Calcium, Total: 9 mg/dL
Chloride: 97 mmol/L — ABNORMAL LOW
Co2: 28 mmol/L
Creatinine: 4.04 mg/dL — ABNORMAL HIGH
EGFR (African American): 17 — ABNORMAL LOW
EGFR (Non-African Amer.): 15 — ABNORMAL LOW
Glucose: 138 mg/dL — ABNORMAL HIGH
Osmolality: 282
Potassium: 3.7 mmol/L
Sodium: 131 mmol/L — ABNORMAL LOW

## 2013-08-28 LAB — CBC WITH DIFFERENTIAL/PLATELET
Basophil #: 0 10*3/uL (ref 0.0–0.1)
HCT: 20.7 % — ABNORMAL LOW (ref 40.0–52.0)
HGB: 7.2 g/dL — ABNORMAL LOW (ref 13.0–18.0)
MCH: 33.6 pg (ref 26.0–34.0)
Neutrophil #: 2.6 10*3/uL (ref 1.4–6.5)
Neutrophil %: 65.7 %
Platelet: 64 10*3/uL — ABNORMAL LOW (ref 150–440)

## 2013-08-28 LAB — PHOSPHORUS: Phosphorus: 4.9 mg/dL

## 2013-08-29 LAB — CBC WITH DIFFERENTIAL/PLATELET
Basophil %: 0.8 %
Eosinophil %: 1.3 %
HCT: 20.1 % — ABNORMAL LOW (ref 40.0–52.0)
Lymphocyte %: 14.7 %
MCHC: 34 g/dL (ref 32.0–36.0)
Monocyte #: 0.7 x10 3/mm (ref 0.2–1.0)
Monocyte %: 15.7 %
Platelet: 65 10*3/uL — ABNORMAL LOW (ref 150–440)
RBC: 2.07 10*6/uL — ABNORMAL LOW (ref 4.40–5.90)

## 2013-08-29 LAB — PROTEIN / CREATININE RATIO, URINE
Creatinine, Urine: 120.2 mg/dL (ref 30.0–125.0)
Protein, Random Urine: 21 mg/dL — ABNORMAL HIGH (ref 0–12)

## 2013-08-29 LAB — BASIC METABOLIC PANEL
Chloride: 103 mmol/L (ref 98–107)
Co2: 26 mmol/L (ref 21–32)
Creatinine: 2.88 mg/dL — ABNORMAL HIGH (ref 0.60–1.30)
Sodium: 135 mmol/L — ABNORMAL LOW (ref 136–145)

## 2013-08-29 LAB — TSH: Thyroid Stimulating Horm: 1.5 u[IU]/mL

## 2013-08-30 LAB — BASIC METABOLIC PANEL
BUN: 49 mg/dL — ABNORMAL HIGH (ref 7–18)
Calcium, Total: 9.1 mg/dL (ref 8.5–10.1)
Chloride: 106 mmol/L (ref 98–107)
Creatinine: 2.02 mg/dL — ABNORMAL HIGH (ref 0.60–1.30)
Glucose: 106 mg/dL — ABNORMAL HIGH (ref 65–99)
Potassium: 3.6 mmol/L (ref 3.5–5.1)
Sodium: 140 mmol/L (ref 136–145)

## 2013-08-30 LAB — CBC WITH DIFFERENTIAL/PLATELET
Basophil %: 1.3 %
Eosinophil %: 2.6 %
HCT: 23.1 % — ABNORMAL LOW (ref 40.0–52.0)
Lymphocyte #: 0.7 10*3/uL — ABNORMAL LOW (ref 1.0–3.6)
Lymphocyte %: 18.8 %
MCHC: 34.3 g/dL (ref 32.0–36.0)
Monocyte #: 0.6 x10 3/mm (ref 0.2–1.0)
Monocyte %: 17.6 %
Neutrophil #: 2.1 10*3/uL (ref 1.4–6.5)
Platelet: 69 10*3/uL — ABNORMAL LOW (ref 150–440)
RBC: 2.42 10*6/uL — ABNORMAL LOW (ref 4.40–5.90)

## 2013-08-30 LAB — UR PROT ELECTROPHORESIS, URINE RANDOM

## 2013-08-30 LAB — PROTEIN ELECTROPHORESIS(ARMC)

## 2013-08-31 LAB — CBC WITH DIFFERENTIAL/PLATELET
Basophil %: 1.1 %
Eosinophil #: 0.1 10*3/uL (ref 0.0–0.7)
Eosinophil %: 2.7 %
HCT: 22.3 % — ABNORMAL LOW (ref 40.0–52.0)
HGB: 7.8 g/dL — ABNORMAL LOW (ref 13.0–18.0)
Lymphocyte #: 0.8 10*3/uL — ABNORMAL LOW (ref 1.0–3.6)
Lymphocyte %: 21.2 %
Neutrophil #: 2.2 10*3/uL (ref 1.4–6.5)
Platelet: 76 10*3/uL — ABNORMAL LOW (ref 150–440)
RBC: 2.32 10*6/uL — ABNORMAL LOW (ref 4.40–5.90)
WBC: 3.7 10*3/uL — ABNORMAL LOW (ref 3.8–10.6)

## 2013-08-31 LAB — COMPREHENSIVE METABOLIC PANEL
Albumin: 2.9 g/dL — ABNORMAL LOW (ref 3.4–5.0)
BUN: 39 mg/dL — ABNORMAL HIGH (ref 7–18)
Bilirubin,Total: 0.6 mg/dL (ref 0.2–1.0)
Calcium, Total: 8.9 mg/dL (ref 8.5–10.1)
Creatinine: 1.53 mg/dL — ABNORMAL HIGH (ref 0.60–1.30)
Osmolality: 296 (ref 275–301)
Potassium: 3.5 mmol/L (ref 3.5–5.1)
SGOT(AST): 35 U/L (ref 15–37)
SGPT (ALT): 15 U/L (ref 12–78)
Sodium: 143 mmol/L (ref 136–145)

## 2013-09-01 ENCOUNTER — Ambulatory Visit: Payer: Self-pay | Admitting: Internal Medicine

## 2013-09-01 DIAGNOSIS — E538 Deficiency of other specified B group vitamins: Secondary | ICD-10-CM | POA: Diagnosis not present

## 2013-09-01 DIAGNOSIS — I10 Essential (primary) hypertension: Secondary | ICD-10-CM | POA: Diagnosis not present

## 2013-09-01 DIAGNOSIS — E119 Type 2 diabetes mellitus without complications: Secondary | ICD-10-CM | POA: Diagnosis not present

## 2013-09-01 DIAGNOSIS — D696 Thrombocytopenia, unspecified: Secondary | ICD-10-CM | POA: Diagnosis not present

## 2013-09-01 DIAGNOSIS — G35 Multiple sclerosis: Secondary | ICD-10-CM | POA: Diagnosis not present

## 2013-09-01 DIAGNOSIS — D649 Anemia, unspecified: Secondary | ICD-10-CM | POA: Diagnosis not present

## 2013-09-01 DIAGNOSIS — E7800 Pure hypercholesterolemia, unspecified: Secondary | ICD-10-CM | POA: Diagnosis not present

## 2013-09-01 DIAGNOSIS — Z79899 Other long term (current) drug therapy: Secondary | ICD-10-CM | POA: Diagnosis not present

## 2013-09-01 DIAGNOSIS — D61818 Other pancytopenia: Secondary | ICD-10-CM | POA: Diagnosis not present

## 2013-09-01 DIAGNOSIS — K219 Gastro-esophageal reflux disease without esophagitis: Secondary | ICD-10-CM | POA: Diagnosis not present

## 2013-09-04 DIAGNOSIS — E538 Deficiency of other specified B group vitamins: Secondary | ICD-10-CM | POA: Diagnosis not present

## 2013-09-04 DIAGNOSIS — D696 Thrombocytopenia, unspecified: Secondary | ICD-10-CM | POA: Diagnosis not present

## 2013-09-04 DIAGNOSIS — D61818 Other pancytopenia: Secondary | ICD-10-CM | POA: Diagnosis not present

## 2013-09-04 DIAGNOSIS — G35 Multiple sclerosis: Secondary | ICD-10-CM | POA: Diagnosis not present

## 2013-09-04 DIAGNOSIS — D649 Anemia, unspecified: Secondary | ICD-10-CM | POA: Diagnosis not present

## 2013-09-04 DIAGNOSIS — I10 Essential (primary) hypertension: Secondary | ICD-10-CM | POA: Diagnosis not present

## 2013-09-04 DIAGNOSIS — R5381 Other malaise: Secondary | ICD-10-CM | POA: Diagnosis not present

## 2013-09-04 LAB — CBC CANCER CENTER
Eosinophil: 1 %
HCT: 27.4 % — ABNORMAL LOW (ref 40.0–52.0)
HGB: 9 g/dL — ABNORMAL LOW (ref 13.0–18.0)
Lymphocytes: 11 %
MCHC: 33 g/dL (ref 32.0–36.0)
Monocytes: 10 %
Platelet: 92 x10 3/mm — ABNORMAL LOW (ref 150–440)
Segmented Neutrophils: 78 %
WBC: 7.6 x10 3/mm (ref 3.8–10.6)

## 2013-09-06 DIAGNOSIS — E538 Deficiency of other specified B group vitamins: Secondary | ICD-10-CM | POA: Diagnosis not present

## 2013-09-06 DIAGNOSIS — J019 Acute sinusitis, unspecified: Secondary | ICD-10-CM | POA: Diagnosis not present

## 2013-09-06 DIAGNOSIS — Z09 Encounter for follow-up examination after completed treatment for conditions other than malignant neoplasm: Secondary | ICD-10-CM | POA: Diagnosis not present

## 2013-09-06 DIAGNOSIS — I10 Essential (primary) hypertension: Secondary | ICD-10-CM | POA: Diagnosis not present

## 2013-09-06 DIAGNOSIS — E119 Type 2 diabetes mellitus without complications: Secondary | ICD-10-CM | POA: Diagnosis not present

## 2013-09-07 DIAGNOSIS — R609 Edema, unspecified: Secondary | ICD-10-CM | POA: Diagnosis not present

## 2013-09-07 DIAGNOSIS — D643 Other sideroblastic anemias: Secondary | ICD-10-CM | POA: Diagnosis not present

## 2013-09-07 DIAGNOSIS — N179 Acute kidney failure, unspecified: Secondary | ICD-10-CM | POA: Diagnosis not present

## 2013-09-07 DIAGNOSIS — D500 Iron deficiency anemia secondary to blood loss (chronic): Secondary | ICD-10-CM | POA: Diagnosis not present

## 2013-09-11 ENCOUNTER — Other Ambulatory Visit: Payer: Self-pay | Admitting: Neurology

## 2013-09-12 DIAGNOSIS — E538 Deficiency of other specified B group vitamins: Secondary | ICD-10-CM | POA: Diagnosis not present

## 2013-09-12 DIAGNOSIS — D61818 Other pancytopenia: Secondary | ICD-10-CM | POA: Diagnosis not present

## 2013-09-12 DIAGNOSIS — D649 Anemia, unspecified: Secondary | ICD-10-CM | POA: Diagnosis not present

## 2013-09-12 DIAGNOSIS — G35 Multiple sclerosis: Secondary | ICD-10-CM | POA: Diagnosis not present

## 2013-09-12 DIAGNOSIS — D696 Thrombocytopenia, unspecified: Secondary | ICD-10-CM | POA: Diagnosis not present

## 2013-09-12 DIAGNOSIS — I10 Essential (primary) hypertension: Secondary | ICD-10-CM | POA: Diagnosis not present

## 2013-09-12 LAB — CBC CANCER CENTER
Basophil #: 0 x10 3/mm (ref 0.0–0.1)
Basophil %: 0.4 %
Eosinophil #: 0.3 x10 3/mm (ref 0.0–0.7)
Eosinophil %: 6.1 %
HGB: 9.5 g/dL — ABNORMAL LOW (ref 13.0–18.0)
Lymphocyte #: 0.9 x10 3/mm — ABNORMAL LOW (ref 1.0–3.6)
Lymphocyte %: 16 %
MCH: 31.3 pg (ref 26.0–34.0)
MCHC: 32.3 g/dL (ref 32.0–36.0)
MCV: 97 fL (ref 80–100)
Monocyte #: 0.6 x10 3/mm (ref 0.2–1.0)
Monocyte %: 10.5 %
Neutrophil #: 3.7 x10 3/mm (ref 1.4–6.5)
Neutrophil %: 67 %
Platelet: 192 x10 3/mm (ref 150–440)
WBC: 5.5 x10 3/mm (ref 3.8–10.6)

## 2013-09-14 NOTE — Telephone Encounter (Signed)
Patient's prescription was faxed to [facility] at [phone].

## 2013-09-18 DIAGNOSIS — E538 Deficiency of other specified B group vitamins: Secondary | ICD-10-CM | POA: Diagnosis not present

## 2013-09-19 DIAGNOSIS — I10 Essential (primary) hypertension: Secondary | ICD-10-CM | POA: Diagnosis not present

## 2013-09-19 DIAGNOSIS — N179 Acute kidney failure, unspecified: Secondary | ICD-10-CM | POA: Diagnosis not present

## 2013-09-19 DIAGNOSIS — R609 Edema, unspecified: Secondary | ICD-10-CM | POA: Diagnosis not present

## 2013-09-24 ENCOUNTER — Ambulatory Visit: Payer: Self-pay | Admitting: Internal Medicine

## 2013-09-24 DIAGNOSIS — E119 Type 2 diabetes mellitus without complications: Secondary | ICD-10-CM | POA: Diagnosis not present

## 2013-09-24 DIAGNOSIS — E538 Deficiency of other specified B group vitamins: Secondary | ICD-10-CM | POA: Diagnosis not present

## 2013-09-24 DIAGNOSIS — Z7982 Long term (current) use of aspirin: Secondary | ICD-10-CM | POA: Diagnosis not present

## 2013-09-24 DIAGNOSIS — Z801 Family history of malignant neoplasm of trachea, bronchus and lung: Secondary | ICD-10-CM | POA: Diagnosis not present

## 2013-09-24 DIAGNOSIS — G35 Multiple sclerosis: Secondary | ICD-10-CM | POA: Diagnosis not present

## 2013-09-24 DIAGNOSIS — Z79899 Other long term (current) drug therapy: Secondary | ICD-10-CM | POA: Diagnosis not present

## 2013-09-24 DIAGNOSIS — K219 Gastro-esophageal reflux disease without esophagitis: Secondary | ICD-10-CM | POA: Diagnosis not present

## 2013-09-24 DIAGNOSIS — E7800 Pure hypercholesterolemia, unspecified: Secondary | ICD-10-CM | POA: Diagnosis not present

## 2013-09-24 DIAGNOSIS — I10 Essential (primary) hypertension: Secondary | ICD-10-CM | POA: Diagnosis not present

## 2013-09-24 DIAGNOSIS — D61818 Other pancytopenia: Secondary | ICD-10-CM | POA: Diagnosis not present

## 2013-10-01 ENCOUNTER — Telehealth: Payer: Self-pay | Admitting: Neurology

## 2013-10-01 NOTE — Telephone Encounter (Signed)
Cierra from [facility] calling about prior authorization for Betaseron for the patient. Please call.

## 2013-10-02 DIAGNOSIS — E538 Deficiency of other specified B group vitamins: Secondary | ICD-10-CM | POA: Diagnosis not present

## 2013-10-02 NOTE — Telephone Encounter (Signed)
I called the number back, it is not for [facility].  It is the Betaseron line.  I spoke with [place] and she said Betaseron requires [place].  They do not have ins contact info and said they already have an Rx on file.  We do not have the ins card on file for [facility] either for contact number.  I completed the online request for coverage.  Pending response.

## 2013-10-03 ENCOUNTER — Telehealth: Payer: Self-pay

## 2013-10-03 NOTE — Telephone Encounter (Signed)
[facility] sent [place] a letter saying they have approved our request for coverage on Betaseron effective until 10/02/2014 Patient ID # [id number] PA Ref # [id number]

## 2013-10-09 DIAGNOSIS — H40009 Preglaucoma, unspecified, unspecified eye: Secondary | ICD-10-CM | POA: Diagnosis not present

## 2013-10-10 DIAGNOSIS — E538 Deficiency of other specified B group vitamins: Secondary | ICD-10-CM | POA: Diagnosis not present

## 2013-10-10 DIAGNOSIS — G35 Multiple sclerosis: Secondary | ICD-10-CM | POA: Diagnosis not present

## 2013-10-10 DIAGNOSIS — D61818 Other pancytopenia: Secondary | ICD-10-CM | POA: Diagnosis not present

## 2013-10-10 LAB — IRON AND TIBC
Iron Saturation: 15 %
Iron: 63 ug/dL — ABNORMAL LOW (ref 65–175)
Unbound Iron-Bind.Cap.: 353 ug/dL

## 2013-10-10 LAB — CBC CANCER CENTER
Basophil #: 0.1 x10 3/mm (ref 0.0–0.1)
Eosinophil #: 0.1 x10 3/mm (ref 0.0–0.7)
Eosinophil %: 3.5 %
HCT: 36 % — ABNORMAL LOW (ref 40.0–52.0)
Lymphocyte #: 1 x10 3/mm (ref 1.0–3.6)
MCHC: 33.7 g/dL (ref 32.0–36.0)
MCV: 94 fL (ref 80–100)
Neutrophil %: 56.1 %
RDW: 16.4 % — ABNORMAL HIGH (ref 11.5–14.5)
WBC: 3.6 x10 3/mm — ABNORMAL LOW (ref 3.8–10.6)

## 2013-10-10 LAB — RETICULOCYTES
Absolute Retic Count: 0.066 10*6/uL (ref 0.019–0.186)
Reticulocyte: 1.74 % (ref 0.4–3.1)

## 2013-10-10 LAB — FERRITIN: Ferritin (ARMC): 90 ng/mL (ref 8–388)

## 2013-10-16 DIAGNOSIS — E538 Deficiency of other specified B group vitamins: Secondary | ICD-10-CM | POA: Diagnosis not present

## 2013-10-23 DIAGNOSIS — D509 Iron deficiency anemia, unspecified: Secondary | ICD-10-CM | POA: Diagnosis not present

## 2013-10-23 DIAGNOSIS — Z23 Encounter for immunization: Secondary | ICD-10-CM | POA: Diagnosis not present

## 2013-10-23 DIAGNOSIS — E785 Hyperlipidemia, unspecified: Secondary | ICD-10-CM | POA: Diagnosis not present

## 2013-10-23 DIAGNOSIS — E119 Type 2 diabetes mellitus without complications: Secondary | ICD-10-CM | POA: Diagnosis not present

## 2013-10-23 DIAGNOSIS — I10 Essential (primary) hypertension: Secondary | ICD-10-CM | POA: Diagnosis not present

## 2013-10-25 ENCOUNTER — Ambulatory Visit: Payer: Self-pay | Admitting: Internal Medicine

## 2013-10-25 HISTORY — PX: COLONOSCOPY: SHX174

## 2013-10-30 DIAGNOSIS — E538 Deficiency of other specified B group vitamins: Secondary | ICD-10-CM | POA: Diagnosis not present

## 2013-11-07 ENCOUNTER — Telehealth: Payer: Self-pay | Admitting: Neurology

## 2013-11-07 NOTE — Telephone Encounter (Signed)
I called [facility].  Spoke with Freda Munro.  She said the patient Is already set up.  She then transferred me to the pharmacist to see if there was anything further needed from [place].  They said they have everything they need.  They are going to reach out to the patient.  Apparently, they needed to schedule shipment.  Nothing further is needed at this time on our end.

## 2013-11-07 NOTE — Telephone Encounter (Signed)
Calling because she states [facility] is saying that Betaseron needs to be set up please call [phone] to take care of this. His acct # is [id number]- pt only has 73more left

## 2013-11-12 DIAGNOSIS — N183 Chronic kidney disease, stage 3 unspecified: Secondary | ICD-10-CM | POA: Diagnosis not present

## 2013-11-12 DIAGNOSIS — E119 Type 2 diabetes mellitus without complications: Secondary | ICD-10-CM | POA: Diagnosis not present

## 2013-11-12 DIAGNOSIS — Z0289 Encounter for other administrative examinations: Secondary | ICD-10-CM

## 2013-11-12 DIAGNOSIS — N182 Chronic kidney disease, stage 2 (mild): Secondary | ICD-10-CM | POA: Diagnosis not present

## 2013-11-13 DIAGNOSIS — E538 Deficiency of other specified B group vitamins: Secondary | ICD-10-CM | POA: Diagnosis not present

## 2013-11-27 DIAGNOSIS — E538 Deficiency of other specified B group vitamins: Secondary | ICD-10-CM | POA: Diagnosis not present

## 2013-12-18 DIAGNOSIS — E538 Deficiency of other specified B group vitamins: Secondary | ICD-10-CM | POA: Diagnosis not present

## 2014-01-01 DIAGNOSIS — E538 Deficiency of other specified B group vitamins: Secondary | ICD-10-CM | POA: Diagnosis not present

## 2014-01-15 DIAGNOSIS — E538 Deficiency of other specified B group vitamins: Secondary | ICD-10-CM | POA: Diagnosis not present

## 2014-01-29 DIAGNOSIS — E538 Deficiency of other specified B group vitamins: Secondary | ICD-10-CM | POA: Diagnosis not present

## 2014-02-12 DIAGNOSIS — E538 Deficiency of other specified B group vitamins: Secondary | ICD-10-CM | POA: Diagnosis not present

## 2014-02-25 DIAGNOSIS — J019 Acute sinusitis, unspecified: Secondary | ICD-10-CM | POA: Diagnosis not present

## 2014-03-12 DIAGNOSIS — E538 Deficiency of other specified B group vitamins: Secondary | ICD-10-CM | POA: Diagnosis not present

## 2014-03-16 ENCOUNTER — Other Ambulatory Visit: Payer: Self-pay | Admitting: Neurology

## 2014-03-21 NOTE — Telephone Encounter (Signed)
Rx has been faxed.

## 2014-03-26 DIAGNOSIS — E538 Deficiency of other specified B group vitamins: Secondary | ICD-10-CM | POA: Diagnosis not present

## 2014-04-08 DIAGNOSIS — H40009 Preglaucoma, unspecified, unspecified eye: Secondary | ICD-10-CM | POA: Diagnosis not present

## 2014-04-09 DIAGNOSIS — E538 Deficiency of other specified B group vitamins: Secondary | ICD-10-CM | POA: Diagnosis not present

## 2014-04-23 DIAGNOSIS — E538 Deficiency of other specified B group vitamins: Secondary | ICD-10-CM | POA: Diagnosis not present

## 2014-04-29 ENCOUNTER — Other Ambulatory Visit: Payer: Self-pay | Admitting: Neurology

## 2014-05-01 DIAGNOSIS — E119 Type 2 diabetes mellitus without complications: Secondary | ICD-10-CM | POA: Diagnosis not present

## 2014-05-01 DIAGNOSIS — I270 Primary pulmonary hypertension: Secondary | ICD-10-CM | POA: Diagnosis not present

## 2014-05-01 DIAGNOSIS — R609 Edema, unspecified: Secondary | ICD-10-CM | POA: Diagnosis not present

## 2014-05-01 DIAGNOSIS — N182 Chronic kidney disease, stage 2 (mild): Secondary | ICD-10-CM | POA: Diagnosis not present

## 2014-05-07 DIAGNOSIS — E538 Deficiency of other specified B group vitamins: Secondary | ICD-10-CM | POA: Diagnosis not present

## 2014-05-10 DIAGNOSIS — F329 Major depressive disorder, single episode, unspecified: Secondary | ICD-10-CM | POA: Diagnosis not present

## 2014-05-10 DIAGNOSIS — E119 Type 2 diabetes mellitus without complications: Secondary | ICD-10-CM | POA: Diagnosis not present

## 2014-05-10 DIAGNOSIS — I10 Essential (primary) hypertension: Secondary | ICD-10-CM | POA: Diagnosis not present

## 2014-05-10 DIAGNOSIS — F3289 Other specified depressive episodes: Secondary | ICD-10-CM | POA: Diagnosis not present

## 2014-05-10 DIAGNOSIS — E538 Deficiency of other specified B group vitamins: Secondary | ICD-10-CM | POA: Diagnosis not present

## 2014-05-21 DIAGNOSIS — E538 Deficiency of other specified B group vitamins: Secondary | ICD-10-CM | POA: Diagnosis not present

## 2014-06-04 DIAGNOSIS — E119 Type 2 diabetes mellitus without complications: Secondary | ICD-10-CM | POA: Diagnosis not present

## 2014-06-04 DIAGNOSIS — F329 Major depressive disorder, single episode, unspecified: Secondary | ICD-10-CM | POA: Diagnosis not present

## 2014-06-04 DIAGNOSIS — I10 Essential (primary) hypertension: Secondary | ICD-10-CM | POA: Diagnosis not present

## 2014-06-04 DIAGNOSIS — E538 Deficiency of other specified B group vitamins: Secondary | ICD-10-CM | POA: Diagnosis not present

## 2014-06-04 DIAGNOSIS — F3289 Other specified depressive episodes: Secondary | ICD-10-CM | POA: Diagnosis not present

## 2014-06-18 DIAGNOSIS — E538 Deficiency of other specified B group vitamins: Secondary | ICD-10-CM | POA: Diagnosis not present

## 2014-07-02 DIAGNOSIS — E538 Deficiency of other specified B group vitamins: Secondary | ICD-10-CM | POA: Diagnosis not present

## 2014-07-16 DIAGNOSIS — E538 Deficiency of other specified B group vitamins: Secondary | ICD-10-CM | POA: Diagnosis not present

## 2014-07-30 ENCOUNTER — Ambulatory Visit: Payer: Medicare Other | Admitting: Nurse Practitioner

## 2014-07-30 ENCOUNTER — Encounter: Payer: Self-pay | Admitting: Neurology

## 2014-07-30 ENCOUNTER — Ambulatory Visit (INDEPENDENT_AMBULATORY_CARE_PROVIDER_SITE_OTHER): Payer: Medicare Other | Admitting: Neurology

## 2014-07-30 VITALS — Ht 70.0 in | Wt 236.0 lb

## 2014-07-30 DIAGNOSIS — R269 Unspecified abnormalities of gait and mobility: Secondary | ICD-10-CM

## 2014-07-30 DIAGNOSIS — G35 Multiple sclerosis: Secondary | ICD-10-CM

## 2014-07-30 DIAGNOSIS — E538 Deficiency of other specified B group vitamins: Secondary | ICD-10-CM | POA: Diagnosis not present

## 2014-07-30 MED ORDER — BACLOFEN 10 MG PO TABS: 10.0000 mg | ORAL_TABLET | Freq: Three times a day (TID) | ORAL | Status: DC

## 2014-07-30 MED ORDER — PREGABALIN 150 MG PO CAPS: 150.0000 mg | ORAL_CAPSULE | Freq: Two times a day (BID) | ORAL | Status: DC

## 2014-07-30 NOTE — Progress Notes (Signed)
[facility]  PATIENT: Jeremy [name] DOB: [birth year]   REASON FOR VISIT: Followup for multiple sclerosis   HISTORY OF PRESENT ILLNESS:. [name] is a 63 year old, right handed, Caucasian male  He has PMHx of  diabetes, hypertension and multiple sclerosis.  The diagnosis of multiple sclerosis was confirmed by abnormal MRI brain and cervical, and a spinal fluid testing.  He was initially followed by Dr. Estella [name] in 2008. Last seen in this office Oct 2014, He continues to have gait difficulties and relies on a cane for ambulation.  He has had no problems with his vision or bowel/bladder control.  He has had occasional falls.He has ongoing pain in his legs. Last MS flare was over 2 years ago. He is currently on Betaseron every other day, treatment started in 2007.   MRI cervical in 2013 with findings consistent with MS plaques and no enhancement Mild spinal stenosis.  MRI of the brain with prominent white matter changes consistent with MS. No active areas of demyelination. He continues to have problems with low    The initial symptom was acute onset of left leg more than arm weakness, gait difficulty  in 2006.  This eventually led to the diagnosis of relapsing remitting multiple sclerosis. He has been receiving Betaseron treatment since early 2007.  He has tolerated the medication well.  He has had no problems with lipoatrophy or skin necrosis. In September 2009, he developed GI bleeding.  Workup had demonstrated colon inflammation and obstruction.He underwent colectomy and recovered well from that procedure.  He continues to have gait difficulties and relies on a cane for ambulation.  He has had no problems with his vision or bowel/bladder control. He does have chronic extremity pain, which has been managed with Lyrica and Tramadol.   He uses an Transport planner for situations in which he would need to walk long distances. He has had occasional falls.He has ongoing pain in his  legs     UPDATE 07/2014: He was admitted to hospital in Nov 2014 for anemia, low blood count, was evaluated by hematologist in [facility], now has recovered, he still has moderate gait difficulty.also complains bilateral lower extremity spasticity, is taking Lyrica, there was no clinical flareups, last MRI was in 2013, there was significant lesions in brain, and cervical spine    REVIEW OF SYSTEMS: Full 14 system review of systems performed and notable only for:  Gait difficulty    ALLERGIES: Allergies  Allergen Reactions  . Betadine [Povidone Iodine]     HOME MEDICATIONS: Outpatient Prescriptions Prior to Visit  Medication Sig Dispense Refill  . aspirin 81 MG tablet Take 81 mg by mouth daily.      Marland Kitchen BETASERON 0.3 MG KIT injection Inject subcutaneously 1  syringe every other day  45 each  1  . Cyanocobalamin (VITAMIN B-12 IJ) Inject as directed. Every other week.      . Ferrous Sulfate (IRON SUPPLEMENT PO) Take by mouth 3 (three) times daily.      . Furosemide (LASIX PO) Take by mouth.      Marland Kitchen gemfibrozil (LOPID) 600 MG tablet Take 600 mg by mouth 2 (two) times daily before a meal.      . glipiZIDE (GLUCOTROL XL) 2.5 MG 24 hr tablet Take 2.5 mg by mouth daily.      . metoprolol tartrate (LOPRESSOR) 25 MG tablet Take 25 mg by mouth 2 (two) times daily.      Marland Kitchen omeprazole (PRILOSEC) 40 MG capsule       .  pregabalin (LYRICA) 150 MG capsule Take 150 mg by mouth 2 (two) times daily.      Marland Kitchen PROAIR HFA 108 (90 BASE) MCG/ACT inhaler as needed.      . sertraline (ZOLOFT) 50 MG tablet Take 50 mg by mouth daily.      . simvastatin (ZOCOR) 40 MG tablet Take 40 mg by mouth every evening.      . traMADol (ULTRAM) 50 MG tablet TAKE 1 TABLET BY MOUTH EVERY 6 HOURS AS NEEDED FOR PAIN  120 tablet  5  . metFORMIN (GLUCOPHAGE) 1000 MG tablet Take 1,000 mg by mouth 2 (two) times daily with a meal.      . pioglitazone (ACTOS) 30 MG tablet Take 30 mg by mouth 2 (two) times daily.      . cyanocobalamin 1000  MCG tablet Take 100 mcg by mouth daily.       No facility-administered medications prior to visit.    PAST MEDICAL HISTORY: Past Medical History  Diagnosis Date  . Depression   . Diabetes   . Hyperlipemia   . Hypertension   . Chronic pain   . MS (multiple sclerosis)     PAST SURGICAL HISTORY: Past Surgical History  Procedure Laterality Date  . Colectomy  06-2008    FAMILY HISTORY: Family History  Problem Relation Age of Onset  . Cancer Mother     SOCIAL HISTORY: History   Social History  . Marital Status: Married    Spouse Name: Jeremy [name]    Number of Children: 2  . Years of Education: GED   Occupational History  . Not on file.   Social History Main Topics  . Smoking status: Former Smoker    Quit date: 01/23/2006  . Smokeless tobacco: Never Used  . Alcohol Use: Yes     Comment: rare  . Drug Use: No  . Sexual Activity: Not on file   Other Topics Concern  . Not on file   Social History Narrative   Patient is disabled.    Patient lives with his wife Jeremy [name].    Patient has 2 children.            PHYSICAL EXAM  Filed Vitals:   07/30/14 1407  Height: 5' 10"  (1.778 m)  Weight: 236 lb (107.049 kg)   Body mass index is 33.86 kg/(m^2).  Generalized: Well developed, obese male in no acute distress  Head: normocephalic and atraumatic,. Oropharynx benign  Neck: Supple, no carotid bruits  Cardiac: Regular rate rhythm, no murmur  Musculoskeletal: Mild left hemiparesis Neurological examination   Mentation: Alert oriented to time, place, history taking. Follows all commands speech and language fluent  Cranial nerve II-XII: .Pupils were equal round reactive to light extraocular movements were full, visual field were full on confrontational test. Facial sensation and strength were normal. hearing was intact to finger rubbing bilaterally. Uvula tongue midline. head turning and shoulder shrug and were normal and symmetric.Tongue protrusion into cheek strength  was normal. Motor: Mild left-sided weakness  Coordination: finger-nose-finger, heel-to-shin bilaterally, no dysmetria Reflexes: 1+ and symmetric upper and lower  Gait and Station: Left hemi-spastic gait, wide based,  stiff, ambulates with a cane   DIAGNOSTIC DATA (LABS, IMAGING, TESTING) -None to review ASSESSMENT AND PLAN  63 y.o. year old relapsing remitting multiple sclerosis, marked abnormal MRI of cervical, and brain, last MRI scan was 2013, clinically stable, significant gait difficulty,   1. Continue Betaseron, 2. Repeat MRI of the brain and cervical spine with and without  contrast 3. Add on baclofen 10 mg 3 times a day 4. Return to clinic in one month  Marcial Pacas   [facility] [address] [place], [place] [address] [phone]

## 2014-07-31 ENCOUNTER — Telehealth: Payer: Self-pay | Admitting: Neurology

## 2014-07-31 LAB — COMPREHENSIVE METABOLIC PANEL
ALBUMIN: 4.7 g/dL (ref 3.6–4.8)
ALK PHOS: 95 IU/L (ref 39–117)
ALT: 17 IU/L (ref 0–44)
AST: 26 IU/L (ref 0–40)
Albumin/Globulin Ratio: 1.6 (ref 1.1–2.5)
BUN / CREAT RATIO: 16 (ref 10–22)
BUN: 31 mg/dL — AB (ref 8–27)
CHLORIDE: 100 mmol/L (ref 97–108)
CO2: 22 mmol/L (ref 18–29)
Calcium: 9.8 mg/dL (ref 8.6–10.2)
Creatinine, Ser: 1.88 mg/dL — ABNORMAL HIGH (ref 0.76–1.27)
GFR calc Af Amer: 43 mL/min/{1.73_m2} — ABNORMAL LOW (ref >59)
GFR calc non Af Amer: 37 mL/min/{1.73_m2} — ABNORMAL LOW (ref >59)
Globulin, Total: 2.9 g/dL (ref 1.5–4.5)
Glucose: 66 mg/dL (ref 65–99)
POTASSIUM: 4.3 mmol/L (ref 3.5–5.2)
Sodium: 142 mmol/L (ref 134–144)
Total Bilirubin: 0.3 mg/dL (ref 0.0–1.2)
Total Protein: 7.6 g/dL (ref 6.0–8.5)

## 2014-07-31 LAB — CBC WITH DIFFERENTIAL
BASOS ABS: 0.1 10*3/uL (ref 0.0–0.2)
Basos: 2 %
EOS: 4 %
Eosinophils Absolute: 0.2 10*3/uL (ref 0.0–0.4)
HCT: 38.5 % (ref 37.5–51.0)
Hemoglobin: 13.3 g/dL (ref 12.6–17.7)
IMMATURE GRANS (ABS): 0 10*3/uL (ref 0.0–0.1)
IMMATURE GRANULOCYTES: 0 %
LYMPHS ABS: 1.2 10*3/uL (ref 0.7–3.1)
Lymphs: 31 %
MCH: 31.4 pg (ref 26.6–33.0)
MCHC: 34.5 g/dL (ref 31.5–35.7)
MCV: 91 fL (ref 79–97)
MONOCYTES: 17 %
Monocytes Absolute: 0.6 10*3/uL (ref 0.1–0.9)
NEUTROS PCT: 46 %
Neutrophils Absolute: 1.7 10*3/uL (ref 1.4–7.0)
PLATELETS: 140 10*3/uL — AB (ref 150–379)
RBC: 4.23 x10E6/uL (ref 4.14–5.80)
RDW: 14.9 % (ref 12.3–15.4)
WBC: 3.8 10*3/uL (ref 3.4–10.8)

## 2014-07-31 LAB — THYROID PANEL WITH TSH
Free Thyroxine Index: 1.2 (ref 1.2–4.9)
T3 UPTAKE RATIO: 23 % — AB (ref 24–39)
T4 TOTAL: 5.3 ug/dL (ref 4.5–12.0)
TSH: 1.6 u[IU]/mL (ref 0.450–4.500)

## 2014-07-31 MED ORDER — BACLOFEN 10 MG PO TABS: 10.0000 mg | ORAL_TABLET | Freq: Three times a day (TID) | ORAL | Status: DC

## 2014-07-31 NOTE — Telephone Encounter (Signed)
It appears the Rx was sent to mail order at [place].  I have resent it to local pharmacy.  Called the patient back.  They are aware.

## 2014-07-31 NOTE — Telephone Encounter (Signed)
Patient's spouse calling regarding Rx for Baclofen 10 mg tabs 3 x's daily not sent to pharmacy.  Dr. Krista Blue wanted patient to start medication today per spouse.  Please call and advise.

## 2014-08-01 ENCOUNTER — Telehealth: Payer: Self-pay | Admitting: Neurology

## 2014-08-01 NOTE — Telephone Encounter (Signed)
I have left a message for him, laboratory tests showed mild abnormality, I will fax results to his primary care, detail were explained at his followup visit in November tenth

## 2014-08-13 DIAGNOSIS — E538 Deficiency of other specified B group vitamins: Secondary | ICD-10-CM | POA: Diagnosis not present

## 2014-08-15 ENCOUNTER — Ambulatory Visit
Admission: RE | Admit: 2014-08-15 | Discharge: 2014-08-15 | Disposition: A | Discharge status: Home / Self Care | Payer: Medicare Other | Source: Ambulatory Visit | Attending: Neurology | Admitting: Neurology

## 2014-08-15 DIAGNOSIS — R269 Unspecified abnormalities of gait and mobility: Secondary | ICD-10-CM

## 2014-08-15 DIAGNOSIS — G35 Multiple sclerosis: Secondary | ICD-10-CM | POA: Diagnosis not present

## 2014-08-20 ENCOUNTER — Telehealth: Payer: Self-pay | Admitting: Neurology

## 2014-08-20 NOTE — Telephone Encounter (Signed)
Dana: Please call patient, there was no significant change at his MRI of the brain, and cervical spine, will go over details in his follow-up visit in November 2015

## 2014-08-21 NOTE — Telephone Encounter (Signed)
Called and spoke to patient relayed MRI results patient will keep his follow up with Dr.Yan 09/03/2014.

## 2014-08-27 DIAGNOSIS — E538 Deficiency of other specified B group vitamins: Secondary | ICD-10-CM | POA: Diagnosis not present

## 2014-09-03 ENCOUNTER — Ambulatory Visit (INDEPENDENT_AMBULATORY_CARE_PROVIDER_SITE_OTHER): Payer: Medicare Other | Admitting: Neurology

## 2014-09-03 ENCOUNTER — Encounter: Payer: Self-pay | Admitting: Neurology

## 2014-09-03 VITALS — BP 108/60 | HR 67 | Ht 70.0 in | Wt 238.0 lb

## 2014-09-03 DIAGNOSIS — G35 Multiple sclerosis: Secondary | ICD-10-CM | POA: Diagnosis not present

## 2014-09-03 NOTE — Progress Notes (Signed)
[facility]  PATIENT: Jeremy [name] DOB: 03-Nov-1950   REASON FOR VISIT: Followup for multiple sclerosis   HISTORY OF PRESENT ILLNESS:. Jeremy [name] is a 63 year old, right handed, Caucasian male  He has PMHx of  diabetes, hypertension and multiple sclerosis.  The diagnosis of multiple sclerosis was confirmed by abnormal MRI brain and cervical, and a spinal fluid testing.  He was initially followed by Dr. Estella Husk in 2008. Last seen in this office Oct 2014, He continues to have gait difficulties and relies on a cane for ambulation.  He has had no problems with his vision or bowel/bladder control.  He has had occasional falls.He has ongoing pain in his legs. Last MS flare was over 2 years ago. He is currently on Betaseron every other day, treatment started in 2007.   MRI cervical in 2013 with findings consistent with MS plaques and no enhancement,mild spinal stenosis.  MRI of the brain with prominent white matter changes consistent with MS. No active areas of demyelination. He continues to have problems with low    The initial symptom was acute onset of left leg more than arm weakness, gait difficulty  in 2006.  This eventually led to the diagnosis of relapsing remitting multiple sclerosis. He has been receiving Betaseron treatment since early 2007.  He has tolerated the medication well.  He has had no problems with lipoatrophy or skin necrosis. In September 2009, he developed GI bleeding.  Workup had demonstrated colon inflammation and obstruction.He underwent colectomy and recovered well from that procedure.  He continues to have gait difficulties and relies on a cane for ambulation.  He has had no problems with his vision or bowel/bladder control. He does have chronic extremity pain, which has been managed with Lyrica and Tramadol.   He uses an Transport planner for situations in which he would need to walk long distances. He has had occasional falls.He has ongoing pain in his  legs     UPDATE 07/2014: He was admitted to hospital in Nov 2014 for anemia, low blood count, was evaluated by hematologist in [facility], now has recovered, he still has moderate gait difficulty.also complains bilateral lower extremity spasticity, is taking Lyrica, there was no clinical flareups, last MRI was in 2013, there was significant lesions in brain, and cervical spine   UPDATE 09/03/2014: He has tried Baclofen 10m qhs, complains of excessive sleepiness, could not tolerate it, he still mows the yard, the most limitation is his gait difficulty,he denies bowel and bladder incontinence.  We have reviewed MRI togather, MRI scan of brain showing periventricular and subcortical white matter hyperintensities consistent with multiple sclerosis. Presence of atrophy of corpus callosum indicates chronic disease. No significant change compared with MRI 06/02/2012   MRI cervical spine showing mild spondylitic changes descibed above most prominent at C 3-4 where there is moderate left formaminal narrowing. Ill defined spinal cord hypertintensities at C 3-4 to c 6-7 likely represent chronic multiple sclerosis inactive plaques. No significant change compared with MRI C spine 06/02/2012  REVIEW OF SYSTEMS: Full 14 system review of systems performed and notable only for:  Gait difficulty    ALLERGIES: Allergies  Allergen Reactions  . Betadine [Povidone Iodine]     HOME MEDICATIONS: Outpatient Prescriptions Prior to Visit  Medication Sig Dispense Refill  . aspirin 81 MG tablet Take 81 mg by mouth daily.    . Cyanocobalamin (VITAMIN B-12 IJ) Inject as directed. Every other week.    . Ferrous Sulfate (IRON SUPPLEMENT PO) Take by  mouth 3 (three) times daily.    . Furosemide (LASIX PO) Take by mouth.    Marland Kitchen gemfibrozil (LOPID) 600 MG tablet Take 600 mg by mouth 2 (two) times daily before a meal.    . glipiZIDE (GLUCOTROL XL) 2.5 MG 24 hr tablet Take 2.5 mg by mouth daily.    . metoprolol tartrate (LOPRESSOR)  25 MG tablet Take 25 mg by mouth 2 (two) times daily.    Marland Kitchen omeprazole (PRILOSEC) 40 MG capsule     . pregabalin (LYRICA) 150 MG capsule Take 1 capsule (150 mg total) by mouth 2 (two) times daily. 60 capsule 11  . PROAIR HFA 108 (90 BASE) MCG/ACT inhaler as needed.    . sertraline (ZOLOFT) 50 MG tablet Take 50 mg by mouth daily.    . simvastatin (ZOCOR) 40 MG tablet Take 40 mg by mouth every evening.    . traMADol (ULTRAM) 50 MG tablet TAKE 1 TABLET BY MOUTH EVERY 6 HOURS AS NEEDED FOR PAIN 120 tablet 5  . baclofen (LIORESAL) 10 MG tablet Take 1 tablet (10 mg total) by mouth 3 (three) times daily. 90 each 11  . BETASERON 0.3 MG KIT injection Inject subcutaneously 1  syringe every other day 45 each 1   No facility-administered medications prior to visit.    PAST MEDICAL HISTORY: Past Medical History  Diagnosis Date  . Depression   . Diabetes   . Hyperlipemia   . Hypertension   . Chronic pain   . MS (multiple sclerosis)     PAST SURGICAL HISTORY: Past Surgical History  Procedure Laterality Date  . Colectomy  06-2008    FAMILY HISTORY: Family History  Problem Relation Age of Onset  . Cancer Mother     SOCIAL HISTORY: History   Social History  . Marital Status: Married    Spouse Name: [name]    Number of Children: 2  . Years of Education: GED   Occupational History  .      Disabled   Social History Main Topics  . Smoking status: Former Smoker    Quit date: 01/23/2006  . Smokeless tobacco: Never Used  . Alcohol Use: 0.0 oz/week    0 Not specified per week     Comment: rare  . Drug Use: No  . Sexual Activity: Not on file   Other Topics Concern  . Not on file   Social History Narrative   Patient is disabled.    Patient lives with his wife Luman Holway.    Patient has 2 children.            PHYSICAL EXAM  Filed Vitals:   09/03/14 0811  BP: 108/60  Pulse: 67  Height: _0  (1.778 m)  Weight: 238 lb (107.956 kg)   Body mass index is 34.15  kg/(m^2).  Generalized: Well developed, obese male in no acute distress  Head: normocephalic and atraumatic,. Oropharynx benign  Neck: Supple, no carotid bruits  Cardiac: Regular rate rhythm, no murmur  Musculoskeletal: Mild left hemiparesis Neurological examination   Mentation: Alert oriented to time, place, history taking. Follows all commands speech and language fluent  Cranial nerve II-XII: .Pupils were equal round reactive to light extraocular movements were full, visual field were full on confrontational test. Facial sensation and strength were normal. hearing was intact to finger rubbing bilaterally. Uvula tongue midline. head turning and shoulder shrug and were normal and symmetric.Tongue protrusion into cheek strength was normal. Motor: Mild left-sided weakness, left upper extremity fixation on  rapid rotating movement,  Sensory: skin discoloration of distal leg, length dependent sensory changes. Coordination: finger-nose-finger, heel-to-shin bilaterally, no dysmetria Reflexes:  Hyperreflexia of both lower extremity Gait and Station:dragging left leg, wide based,  stiff, unsteady  DIAGNOSTIC DATA (LABS, IMAGING, TESTING) -None to review ASSESSMENT AND PLAN  63 y.o. year old relapsing remitting multiple sclerosis, marked abnormal MRI of cervical, and brain, repeat MRI of brain and cervical spine was stable in comparison to 2013, clinically stable, significant gait difficulty,   1. Continue Betaseron, 2. Moderate exercise 3. Return to clinic in 6 months with nurse practitioner   [facility] [address] [place], [place] [address] [phone]

## 2014-09-10 DIAGNOSIS — E538 Deficiency of other specified B group vitamins: Secondary | ICD-10-CM | POA: Diagnosis not present

## 2014-09-24 DIAGNOSIS — E538 Deficiency of other specified B group vitamins: Secondary | ICD-10-CM | POA: Diagnosis not present

## 2014-10-08 DIAGNOSIS — H40003 Preglaucoma, unspecified, bilateral: Secondary | ICD-10-CM | POA: Diagnosis not present

## 2014-10-08 DIAGNOSIS — E538 Deficiency of other specified B group vitamins: Secondary | ICD-10-CM | POA: Diagnosis not present

## 2014-10-22 DIAGNOSIS — E538 Deficiency of other specified B group vitamins: Secondary | ICD-10-CM | POA: Diagnosis not present

## 2014-10-23 ENCOUNTER — Other Ambulatory Visit: Payer: Self-pay | Admitting: Neurology

## 2014-10-24 ENCOUNTER — Other Ambulatory Visit: Payer: Self-pay

## 2014-10-24 MED ORDER — TRAMADOL HCL 50 MG PO TABS: 50.0000 mg | ORAL_TABLET | Freq: Four times a day (QID) | ORAL | Status: DC | PRN

## 2014-10-24 NOTE — Telephone Encounter (Signed)
Rx signed and faxed.

## 2014-10-24 NOTE — Telephone Encounter (Signed)
Dr Krista Blue is out of the office.  Forwarding request to [facility] for approval

## 2014-10-30 ENCOUNTER — Telehealth: Payer: Self-pay | Admitting: Neurology

## 2014-10-30 ENCOUNTER — Telehealth: Payer: Self-pay

## 2014-10-30 ENCOUNTER — Other Ambulatory Visit: Payer: Self-pay | Admitting: Neurology

## 2014-10-30 NOTE — Telephone Encounter (Signed)
I called [facility].  Spoke with Lattie Haw.  Tried to give verbal Rx for expedited delivery, but she said the patient has a refill on file, and the patient must contact them to place the order.  I called the patient back, got not answer.  Left message.

## 2014-10-30 NOTE — Telephone Encounter (Signed)
Lattie Haw with [facility] called back and stated that the patient needs a prior auth. And you can call [phone] and select option #2.

## 2014-10-30 NOTE — Telephone Encounter (Signed)
I called back.  Spoke with Saralyn Pilar.  Provided all clinical information required.  Request is under review.

## 2014-10-30 NOTE — Telephone Encounter (Signed)
[facility] notified us they have approved our request for coverage on Betaseron effective until 10/31/2015 Ref # [id number]

## 2014-10-30 NOTE — Telephone Encounter (Signed)
Patient's spouse [name], requesting Rx refill for Interferon Beta-1b (BETASERON) 0.3 MG KIT injection forwarded to [facility] for expedited delivery, due to patient has been without medication for two days.  Please call and advise.

## 2014-10-31 IMAGING — CR DG RIBS 2V*L*
1 series · 4 of 4 positions shown · non-contrast
Comparison: none

REASON FOR EXAM: tenderness on  LT side s/p fall
COMMENTS:

PROCEDURE:     MDR - MDR RIBS LEFT UNILATERAL  - September 12, 2012  [DATE]
RESULT:     Comparison: None.

[Series 1: ap · 0.17mm/px · 4 of 4 slices shown]
[im 1/4]
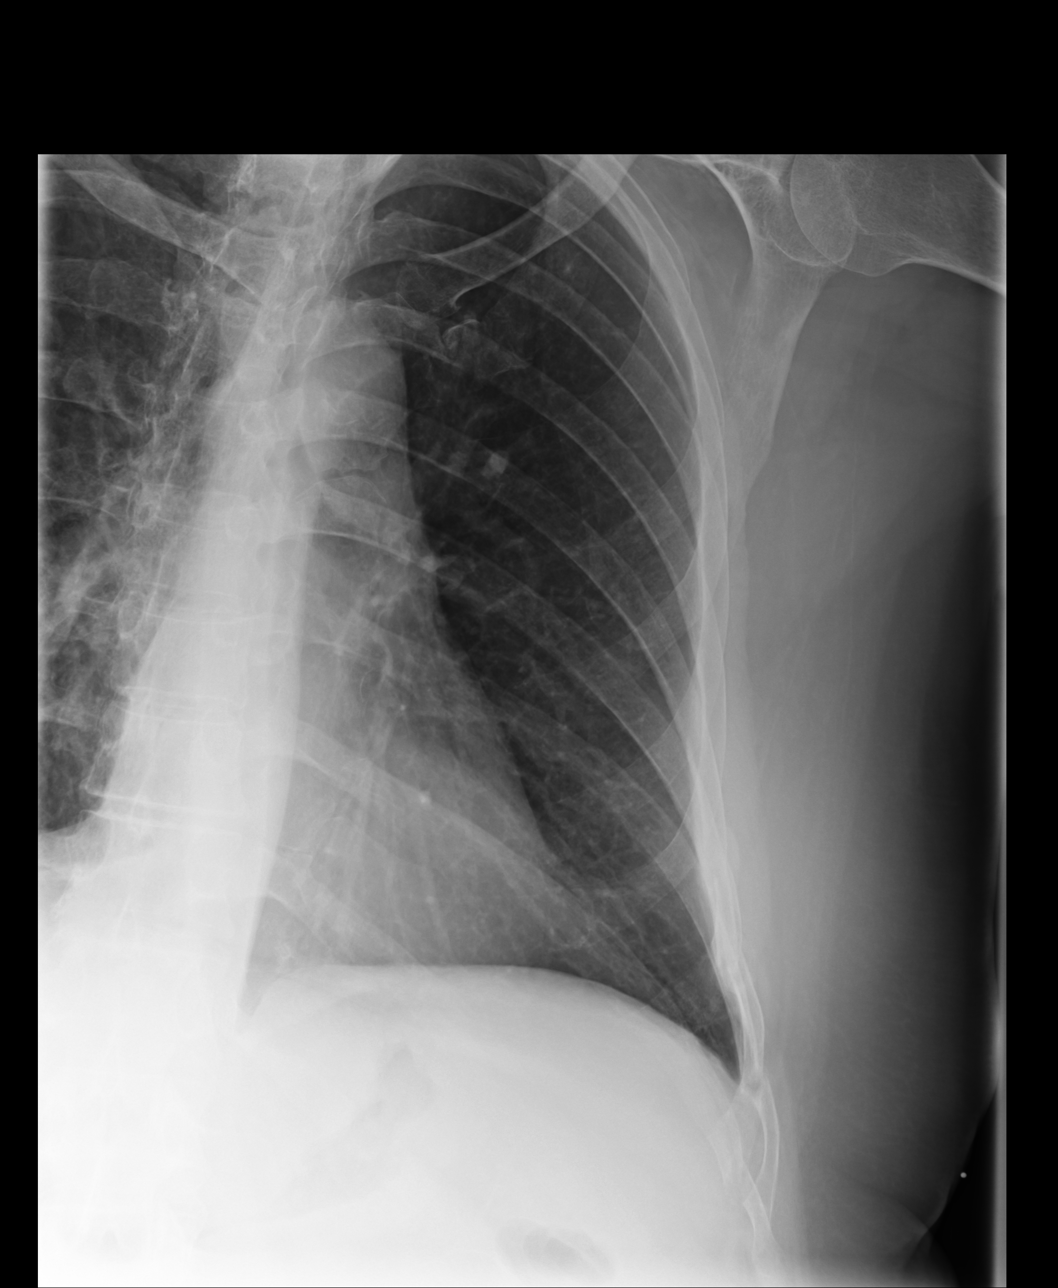
[im 2/4]
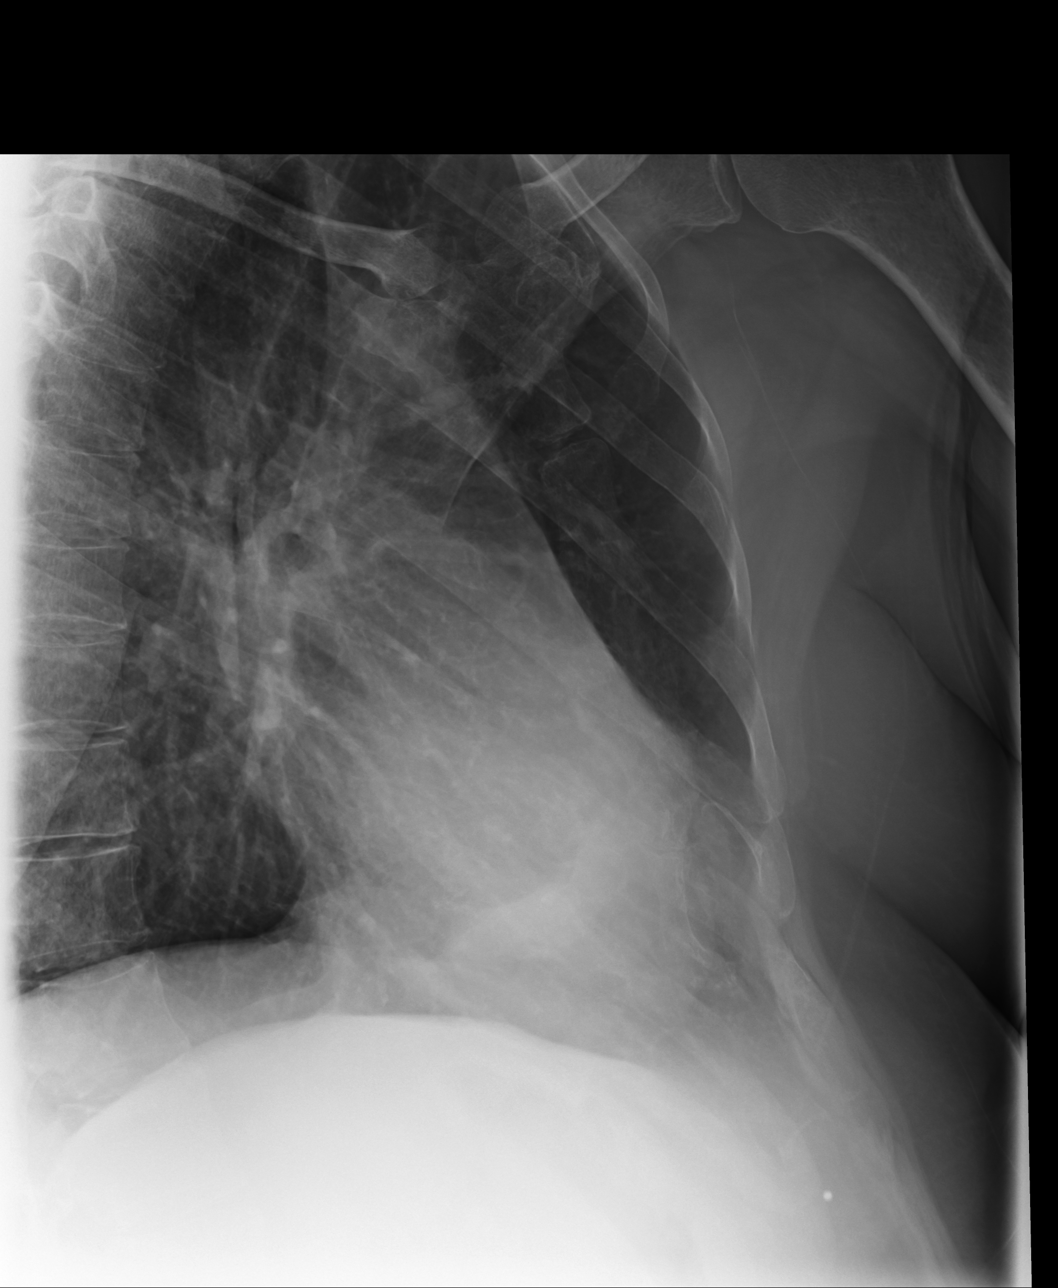
[im 3/4]
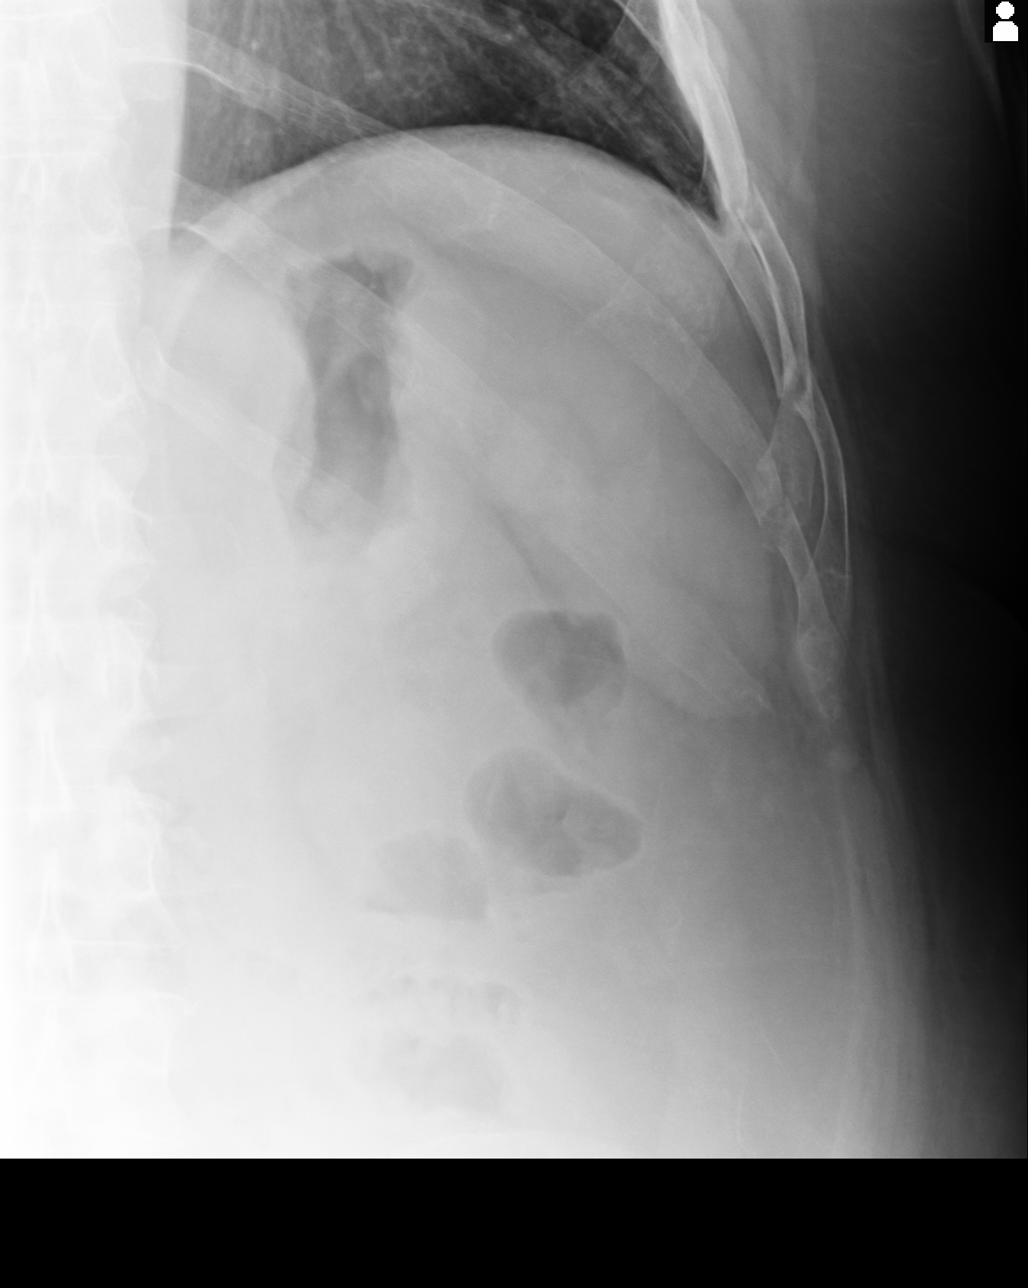
[im 4/4]
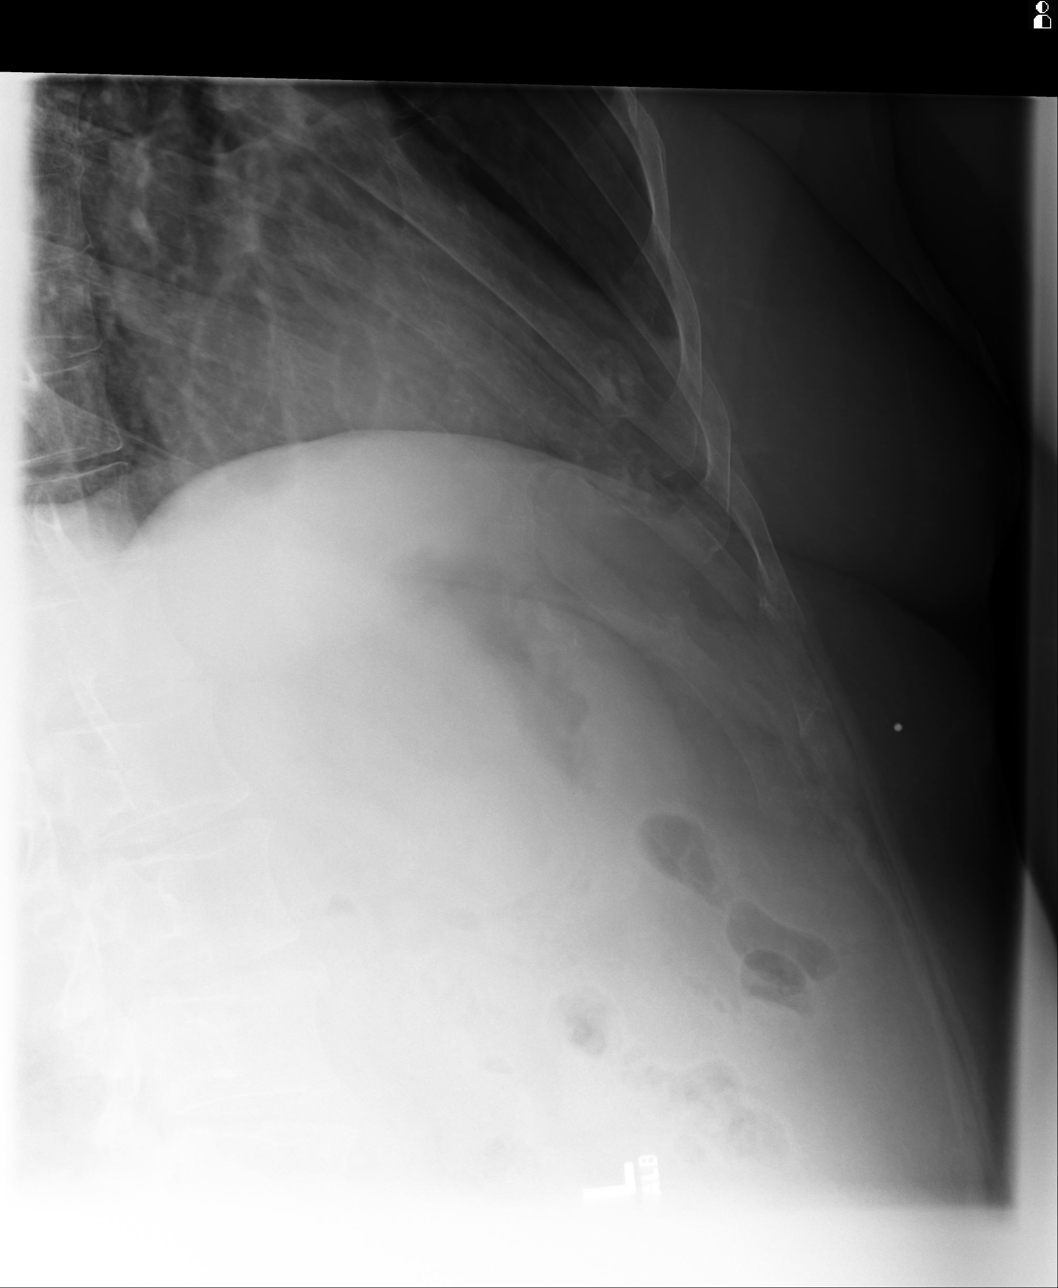

[4 of 4 positions shown; findings below may reference images not displayed]

FINDINGS: There is mild cortical regularity of the lateral left eighth rib. This is
likely related to a nondisplaced fracture. There appears to be associated
mild callus formation. There are likely nondisplaced fractures of the
lateral ninth and tenth rib fractures, as well. No pneumothorax. No pleural
effusion.
IMPRESSION: Nondisplaced healing fracture of the lateral left eighth rib. There are
likely nondisplaced fractures of the left lateral ninth and tenth ribs as
well.

[REDACTED]

## 2014-11-04 DIAGNOSIS — I129 Hypertensive chronic kidney disease with stage 1 through stage 4 chronic kidney disease, or unspecified chronic kidney disease: Secondary | ICD-10-CM | POA: Diagnosis not present

## 2014-11-04 DIAGNOSIS — N183 Chronic kidney disease, stage 3 (moderate): Secondary | ICD-10-CM | POA: Diagnosis not present

## 2014-11-04 DIAGNOSIS — R609 Edema, unspecified: Secondary | ICD-10-CM | POA: Diagnosis not present

## 2014-11-04 DIAGNOSIS — I270 Primary pulmonary hypertension: Secondary | ICD-10-CM | POA: Diagnosis not present

## 2014-11-04 DIAGNOSIS — E1129 Type 2 diabetes mellitus with other diabetic kidney complication: Secondary | ICD-10-CM | POA: Diagnosis not present

## 2014-11-05 DIAGNOSIS — E538 Deficiency of other specified B group vitamins: Secondary | ICD-10-CM | POA: Diagnosis not present

## 2014-11-19 DIAGNOSIS — E538 Deficiency of other specified B group vitamins: Secondary | ICD-10-CM | POA: Diagnosis not present

## 2014-11-19 DIAGNOSIS — Z0000 Encounter for general adult medical examination without abnormal findings: Secondary | ICD-10-CM | POA: Diagnosis not present

## 2014-11-28 DIAGNOSIS — K219 Gastro-esophageal reflux disease without esophagitis: Secondary | ICD-10-CM | POA: Diagnosis not present

## 2014-11-28 DIAGNOSIS — I10 Essential (primary) hypertension: Secondary | ICD-10-CM | POA: Diagnosis not present

## 2014-11-28 DIAGNOSIS — E1140 Type 2 diabetes mellitus with diabetic neuropathy, unspecified: Secondary | ICD-10-CM | POA: Diagnosis not present

## 2014-11-28 DIAGNOSIS — E784 Other hyperlipidemia: Secondary | ICD-10-CM | POA: Diagnosis not present

## 2014-11-28 DIAGNOSIS — F329 Major depressive disorder, single episode, unspecified: Secondary | ICD-10-CM | POA: Diagnosis not present

## 2014-11-28 DIAGNOSIS — E119 Type 2 diabetes mellitus without complications: Secondary | ICD-10-CM | POA: Diagnosis not present

## 2014-12-03 DIAGNOSIS — E538 Deficiency of other specified B group vitamins: Secondary | ICD-10-CM | POA: Diagnosis not present

## 2014-12-17 DIAGNOSIS — E538 Deficiency of other specified B group vitamins: Secondary | ICD-10-CM | POA: Diagnosis not present

## 2014-12-31 DIAGNOSIS — E538 Deficiency of other specified B group vitamins: Secondary | ICD-10-CM | POA: Diagnosis not present

## 2015-01-14 DIAGNOSIS — E538 Deficiency of other specified B group vitamins: Secondary | ICD-10-CM | POA: Diagnosis not present

## 2015-01-14 DIAGNOSIS — H40003 Preglaucoma, unspecified, bilateral: Secondary | ICD-10-CM | POA: Diagnosis not present

## 2015-01-16 DIAGNOSIS — J0100 Acute maxillary sinusitis, unspecified: Secondary | ICD-10-CM | POA: Diagnosis not present

## 2015-01-16 DIAGNOSIS — J301 Allergic rhinitis due to pollen: Secondary | ICD-10-CM | POA: Diagnosis not present

## 2015-01-28 DIAGNOSIS — E538 Deficiency of other specified B group vitamins: Secondary | ICD-10-CM | POA: Diagnosis not present

## 2015-02-14 NOTE — Consult Note (Signed)
Bone Marrow Biopsy is scheduled for Tuesday, 9:30am in the [place].  If patient is still an inpatient, biopsy can be done on the floor.  Continue to monitor daily CBC and transfuse if hbg falls below 7.0 follow.  Electronic Signatures: Delight Hoh (MD)  (Signed on 07-Nov-14 12:32)  Authored  Last Updated: 07-Nov-14 12:32 by Delight Hoh (MD)

## 2015-02-14 NOTE — Consult Note (Signed)
History of Present Illness:  Reason for Consult Pancytopenia.   HPI   Patient is a 64 year old male with multiple medical problems who was recently admitted to the hospital after a fall secondary to a hypoglycemic episode.  Patient currently feels improved, but not back to baseline.  He continues to complain of worsening weakness and fatigue over the past several months.  He denies any recent fevers.  He denies any weight loss.  He has no new neurologic complaints.  He denies any chest pain or shortness of breath.  He denies any nausea, vomiting, consultation, or diarrhea.  He has no urinary complaints.  Patient otherwise feels well and offers no further specific complaints.   PFSH:  Additional Past Medical and Surgical History Hypercholesterolemia, diabetes, hypertension, multiple sclerosis, partial colon resection, GERD, B12 deficiency.  Family history: Mother with lung cancer.  Social history: Patient denies tobacco or alcohol.   Review of Systems:  Performance Status (ECOG) 2   Review of Systems   As per HPI. Otherwise, 10 point system review was negative.   NURSING NOTES: **Vital Signs.:   04-Nov-14 07:58   Vital Signs Type: Routine   Temperature Temperature (F): 97   Celsius: 36.1   Temperature Source: oral   Pulse Pulse: 66   Respirations Respirations: 18   Systolic BP Systolic BP: 161   Diastolic BP (mmHg) Diastolic BP (mmHg): 69   Mean BP: 84   Pulse Ox % Pulse Ox %: 100   Pulse Ox Activity Level: At rest   Oxygen Delivery: 2L   Physical Exam:  Physical Exam General: Well-developed, well-nourished, no acute distress. Eyes: Pink conjunctiva, anicteric sclera.  ecchymosis near left eye secondary to fall. Lungs: Clear to auscultation bilaterally. Heart: Regular rate and rhythm. No rubs, murmurs, or gallops. Abdomen: Soft, nontender, nondistended. No organomegaly noted, normoactive bowel sounds. Musculoskeletal: No edema, cyanosis, or clubbing. Neuro:  Alert, answering all questions appropriately. Cranial nerves grossly intact. Skin: No rashes or petechiae noted. Psych: Normal affect.    Betadine: Unknown    omeprazole 40 mg oral delayed release capsule: 1 cap(s) orally once a day, Status: Active, Quantity: 30, Refills: 3   ferrous sulfate 325 mg oral enteric coated tablet: 1 tab(s) orally 3 times a day , Status: Active, Quantity: 0, Refills: None   furosemide 40 mg oral tablet: 1 tab(s) orally once a day , Status: Active, Quantity: 0, Refills: None   pioglitazone 30 mg oral tablet: 1 tab(s) orally once a day, Status: Active, Quantity: 0, Refills: None   loratadine 10 mg oral tablet: 1 tab(s) orally once a day, Status: Active, Quantity: 0, Refills: None   Metoprolol Tartrate 50 mg oral tablet: 1 tab(s) orally 2 times a day, Status: Active, Quantity: 0, Refills: None   Betaseron 0.3 mg subcutaneous injection: 1 day(s) subcutaneous every other day, Status: Active, Quantity: 0, Refills: None   gemfibrozil 600 mg oral tablet: 1 tab(s) orally 2 times a day, Status: Active, Quantity: 0, Refills: None   glipiZIDE 2.5 mg oral tablet, extended release: 1 tab(s) orally once a day, Status: Active, Quantity: 0, Refills: None   Lyrica 150 mg oral capsule: 1 cap(s) orally 2 times a day, Status: Active, Quantity: 0, Refills: None   sertraline 50 mg oral tablet: 1 tab(s) orally once a day, Status: Active, Quantity: 0, Refills: None   simvastatin 40 mg oral tablet: 1 tab(s) orally once a day, Status: Active, Quantity: 0, Refills: None   traMADol 50 mg oral tablet: 1 tab(s) orally  4 times a day, Status: Active, Quantity: 0, Refills: None   metformin 1000 mg oral tablet: 1 tab(s) orally 2 times a day, Status: Active, Quantity: 0, Refills: None   aspirin 81 mg oral tablet: 1 tab(s) orally once a day, Status: Active, Quantity: 0, Refills: None   Vitamin B12: 1 dose(s) orally every other week, Status: Active, Quantity: 0, Refills: None   Vitamin  B12: 1 dose(s) orally every other week, Status: Active, Quantity: 0, Refills: None  Laboratory Results: Routine Chem:  04-Nov-14 01:07   Phosphorus, Serum 4.9 (Result(s) reported on 28 Aug 2013 at 01:53AM.)  Result Comment TROPONIN - RESULTS VERIFIED BY REPEAT TESTING.  - PREVIOUS CALL:08/27/13 AT 0953.Marland KitchenMarland KitchenTPL  Result(s) reported on 28 Aug 2013 at 02:07AM.  Glucose, Serum  138  BUN  60  Creatinine (comp)  4.04  Sodium, Serum  131  Potassium, Serum 3.7  Chloride, Serum  97  CO2, Serum 28  Calcium (Total), Serum 9.0  Anion Gap  6  Osmolality (calc) 282  eGFR (African American)  17  eGFR (Non-African American)  15 (eGFR values <41m/min/1.73 m2 may be an indication of chronic kidney disease (CKD). Calculated eGFR is useful in patients with stable renal function. The eGFR calculation will not be reliable in acutely ill patients when serum creatinine is changing rapidly. It is not useful in  patients on dialysis. The eGFR calculation may not be applicable to patients at the low and high extremes of body sizes, pregnant women, and vegetarians.)  Cardiac:  04-Nov-14 01:07   CK, Total  816 (Result(s) reported on 28 Aug 2013 at 01:48AM.)  Troponin I  0.07 (0.00-0.05 0.05 ng/mL or less: NEGATIVE  Repeat testing in 3-6 hrs  if clinically indicated. >0.05 ng/mL: POTENTIAL  MYOCARDIAL INJURY. Repeat  testing in 3-6 hrs if  clinically indicated. NOTE: An increase or decrease  of 30% or more on serial  testing suggests a  clinically important change)  Routine Hem:  04-Nov-14 01:07   WBC (CBC) 3.9  RBC (CBC)  2.13  Hemoglobin (CBC)  7.2  Hematocrit (CBC)  20.7  Platelet Count (CBC)  64  MCV 97  MCH 33.6  MCHC 34.6  RDW  15.5  Neutrophil % 65.7  Lymphocyte % 16.7  Monocyte % 14.1  Eosinophil % 2.7  Basophil % 0.8  Neutrophil # 2.6  Lymphocyte #  0.7  Monocyte # 0.6  Eosinophil # 0.1  Basophil # 0.0 (Result(s) reported on 28 Aug 2013 at 04:02AM.)   Assessment and  Plan: Impression:   Pancytopenia. Plan:   1.  Pancytopenia:  Previously, full workup did not reveal a distinct etiology for patient's anemia and thrombocytopenia.  His white count continues to be within normal limits.  After further discussion with the patient again today, he has agreed to pursue bone marrow biopsy which will be scheduled next week as an outpatient.  [place] [facility] [place] [facility] [place] discuss the results and any treatment planning if necessary. Anemia: Transfuse if hemoglobin falls below 7.0. consult, call with questions.  Electronic Signatures: FDelight Hoh(MD)  (Signed [id number])  Authored: HISTORY OF PRESENT ILLNESS, PFSH, ROS, NURSING NOTES, PE, ALLERGIES, HOME MEDICATIONS, LABS, ASSESSMENT AND PLAN   Last Updated: 04-Nov-14 16:24 by FDelight Hoh(MD)

## 2015-02-14 NOTE — Discharge Summary (Signed)
PATIENT NAME:  Jeremy [name], Jeremy [name] MR#:  [id number] DATE OF BIRTH:  12-Feb-1951  DATE OF ADMISSION:  08/27/2013 DATE OF DISCHARGE:  08/31/2013  DISCHARGE DIAGNOSES: 1.  Syncopal episode due to hypoglycemia.  2.  Diabetes, mostly due to diet issue, now with strict diet control in hospital. Blood sugar was normal without any medication.  3.  Acute on chronic renal failure.  4.  Chronic anemia, need bone marrow biopsy by oncologist.  5.  Hypertension.  6.  Obesity.  7.  Chronic edema and fluid overload.  8.  Multiple sclerosis.   CONDITION ON DISCHARGE:  Stable.  CODE STATUS: FULL CODE.   MEDICATIONS ON DISCHARGE: 1.  Ferrous sulfate 325 mg oral enteric-coated 3 times a day.  2.  Omeprazole 40 once a day.  3.  Betaseron 0.3 mg subcutaneous every other day.  4.  Lyrica 150 mg oral capsule 2 times a day.  5.  Sertraline 50 mg oral tablet once a day.  6.  Simvastatin 40 mg once a day.  7.  Tramadol 50 mg 4 times a day.  8.  Aspirin 81 mg once a day.  9.  Vitamin B12 every other week.  10.  Furosemide 40 mg 2 times a day.   DIET ON DISCHARGE: Low fat, low cholesterol, carbohydrate-controlled diet. Regular consistency. Advised strict control of sugar and salt intake into the diet.    As tolerated activity.  Follow up after discharge within 1 to 2 weeks as per appointment with Dr. Delight Hoh and Dr. Murlean Iba.   HISTORY OF PRESENTING ILLNESS: A 64 year old male with history of hypertension, diabetes, chronic kidney disease, brought from home by EMS. Found unconscious on the floor by his wife at 7:00 in the morning, and by EMS, his sugar level was found to be 26. They gave D50 and brought him to the Emergency Room. The patient was alert in the Emergency Room, but he was unaware about the situation of what happened. He was on 3 different medications for his diabetes.   HOSPITAL COURSE AND STAY:  On arrival to ER, he was found having acute worsening of his renal function, possibly that  was the cause for his altered level of antidiabetic medication in his blood and hypoglycemia. Nephrology consult was called in and he suggested to monitor his renal function. As the patient has significant fluid overload, they suggested to give IV Lasix, and the patient he a very good response, so ultimately started on IV Lasix drip. The patient had significant improvement in  renal function and finally decided to discharge him home.   OTHER MEDICAL ISSUES: 1.  Hypoglycemia, likely it was due to glipizide. The patient was taken off all antidiabetic medications and was maintained on diabetic diet in hospital, and his blood sugars were running completely normal, so detailed counseling of the patient and his wife was done about his diabetes situation and advised him diet controlled, and he might not need any medication. He advised them to keep checking his blood sugar level at home more frequently, almost every day.  2.  Anemia and thrombocytopenia. The patient was following with Dr. Grayland Ormond, and the plan was to do bone marrow biopsy. Because of his low hemoglobin level, we called consult with Dr. Grayland Ormond, and he suggested to transfuse. One unit had been ordered as his hemoglobin was less than 7, and suggested to do bone marrow biopsy as an outpatient.  We then gave him an appointment.    3.  Hypertension.  Blood pressure was stable on metoprolol.  4.  Multiple sclerosis. He was taking Betaseron injection by neurologist.  Advised to continue the same every other day.   CONSULTS IN THE HOSPITAL:  Dr. Murlean Iba for nephrology, Dr. Delight Hoh, for hematology.  IMPORTANT LABORATORY RESULTS IN THE HOSPITAL: BNP was 2880 on admission. WBC 5.3, hemoglobin 8.9 on admission. Creatinine was 4.40 and potassium was 3.7. Troponin was 0.12. CT OF THE HEAD WITHOUT CONTRAST: Chronic small vessel ischemia. Urinalysis was negative. Troponin remained stable at 0.1. Hemoglobin was gradually dropping, 7.21 on the 4th  of November.  Echocardiogram of the heart was done which showed ejection fraction 55% to 54%, normal systolic left ventricular function, mild to moderate tricuspid regurgitation, more greatly elevated pulmonary artery systolic pressure. Creatinine came down to 2.88 on the 5th of November. Hemoglobin dropped to 6.8 on the 5th of November and transfusion was done. Creatinine improved to 2.02 on 6th of November , 7th of November it was 1.53. Hemoglobin came up to 7.8 on 7th of November.   TOTAL TIME SPENT ON THIS DISCHARGE: 40 minutes   ____________________________ Ceasar Lund. Anselm Jungling, MD vgv:dmm D: 09/03/2013 21:56:36 ET T: 09/03/2013 22:43:15 ET JOB#: [id number]  cc: Ceasar Lund. Anselm Jungling, MD, <Dictator> Kathlene November. Grayland Ormond, MD Murlean Iba, MD Rosalio Macadamia [facility] MD ELECTRONICALLY SIGNED 09/10/2013 8:18

## 2015-02-14 NOTE — H&P (Signed)
PATIENT NAME:  Jeremy [name], Jeremy [name] MR#:  [id number] DATE OF BIRTH:  October 23, 1951  DATE OF ADMISSION:  08/27/2013  PRIMARY CARE PHYSICIAN:  Dr. Otilio Miu.  EMERGENCY ROOM PHYSICIAN:  Dr. Harvest Dark.  CHIEF COMPLAINT:  Low sugars.  HISTORY OF PRESENT ILLNESS: A 64 year old male patient with history of hypertension, diabetes, chronic kidney disease, brought from the home by EMS as the wife found him unconscious on the floor.  The patient's wife works third shift. Came home around 7:00 in the morning, found the patient unresponsive on the floor. The patient unable to give like what happened last night. According to the wife, he spoke to her at around 11:30, and then he switched off the TV and went to bed, found on the floor by the wife.  The patient did not lose consciousness, was alert, but slurred speech was noted. EMS arrived, and the patient's blood sugar was found to be 26, so EMS gave D50 and brought him to the Emergency Room. The patient had a history of fall like yesterday morning and this morning; and according to the wife, he has been, recently, unstable. No change in medications.  The patient had a fall like last week, and then seen in [facility]. Had x-rays of the ribs, but the patient says that he has to use the walker because he is feeling unstable, but denies any chest pain. No headache. No other problems. The patient takes glipizide 2.5 mg daily and metformin 1 gram p.o. b.i.d. Did take metformin last night. Denies any recent history of travel or recent history of fever.    No shortness of breath. No seizure activity.   PAST MEDICAL HISTORY:  Significant for:  1.  Hypertension.  2.  Diabetes.  3.  Multiple sclerosis.  4.  History of anemia.  5.  Thrombocytopenia, recently seeing Dr. Grayland Ormond and workup is in progress for that.  6.  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   ALLERGIES: DRUG ALLERGIES TO BETADINE.   SOCIAL HISTORY:  No smoking. No drinking. Lives with the family. The patient is independent of activities of daily living. Does use a cane occasionally, but recently had to use walker due to falls and unstable gait.   PAST SURGICAL HISTORY: Significant for colon resection in 2009 for ischemic ulcer of hepatic flexure, supposed to be secondary to etodolac.   FAMILY HISTORY: Mother had lung cancer. Nephew recently diagnosed with stomach cancer.   MEDICATIONS:   1.  Aspirin 81 mg daily.  2.  Betaseron 0.3 mg subcutaneous every other day.  3.  Ferrous sulfate 325 mg p.o. t.i.d.  4.  Furosemide 40 mg p.o. daily. 5.  Gemfibrozil 600 mg p.o. b.i.d. 6.  Glipizide extended release 2.5 mg p.o. daily.  7.  Loratadine 10 mg p.o. daily.  8.  Lyrica 150 mg p.o. b.i.d.  9.  Metformin 1 gram p.o. b.i.d.  10.  Metoprolol 50 mg 1 tablet b.i.d.  11.  Omeprazole 40 mg p.o. daily.  12.  Actos 30 mg p.o. daily.  13.  Zoloft 50 mg p.o. daily.  14.  Simvastatin 40 mg p.o. daily.  15.  Tramadol 50 mg  4 times daily.  16.  Vitamin B12 every other week.   REVIEW OF SYSTEMS: CONSTITUTIONAL:  He feels fatigued and weak.  EYES: No blurred vision. The patient wears glasses.  ENT: No tinnitus. No epistaxis. No difficulty swallowing.  RESPIRATORY: No cough. No wheezing.  CARDIOVASCULAR:  No chest pain. No orthopnea. Does have pedal edema for a long time.  GASTROINTESTINAL: No nausea. No vomiting. No abdominal pain. The patient had a colon surgery. Denies any constipation. No hernias.  GENITOURINARY: No dysuria or hematuria. Noticed decreased urination recently for the last few days.   ENDOCRINE: No polyuria or nocturia.  HEMATOLOGIC:  He does have anemia and easy bruising. The patient is getting workup done with Dr. Grayland Ormond.  INTEGUMENT:  Does have a lot of bruises all over.  MUSCULOSKELETAL: Does have occasional joint pain.  NEUROLOGIC: Has MS, but denies any numbness or weakness that is more than usual.  PSYCHIATRIC: No  anxiety or insomnia.   PHYSICAL EXAMINATION: VITAL SIGNS: Temperature 97.5, heart rate 69. Blood pressure is 134/64. Sats 94% on room air. The patient's initial blood sugar reported by EMS was 26 and received D50. The patient's repeat blood sugar is 65.  GENERAL:  The patient is alert, awake, oriented, answering questions appropriately. Wife thinks he is at the baseline.  HEENT: PERRLA. EOM intact. The patient has no scleral icterus. No conjunctivitis. Hearing is intact. Tympanic membranes clear. No oropharyngeal erythema. Mucous membranes are dry. NECK: No thyroid enlargement. No masses. No lymphadenopathy. No JVD. No carotid bruit.  RESPIRATORY: Clear to auscultation. No wheeze. No rales.  CARDIOVASCULAR: S1, S2 regular. No murmurs. PMI not displaced. The patient does have pedal edema up to knees, 2+. Good femoral pulses.  ABDOMEN: Soft, nontender, nondistended. Bowel sounds present. No hepatosplenomegaly is observed. No hernias. The patient has no bruit.   MUSCULOSKELETAL: Strength 5/5 in upper and lower extremities. Sensation is intact.  SKIN:  The patient does have a lot of ecchymotic spots present all over his body.  NEUROLOGIC: Cranial nerves II through XII intact. Power 5/5 in upper and lower extremities. Sensations are intact. DTRs 2+ bilaterally.  PSYCHIATRIC: Oriented to time, place, person.   LAB DATA: WBC 5.3, hemoglobin 8.9, hematocrit 26.2, platelets 72. Electrolytes: Sodium is 130, potassium 3.7, chloride 95, bicarb 25, BUN 61, creatinine 4.40, glucose 65. LFTs within normal range. Troponin slightly up at 0.12.   BNP up at 2880.  CT head shows chronic versus ischemic changes. No evidence of acute infarct. No mass effect.   The patient's EKG shows junctional rhythm with 70 beats per minute. No ST-T changes.   ASSESSMENT AND PLAN: The patient is a 64 year old male with hypoglycemia, altered mental status secondary to:  1.  Hypoglycemia. The patient's glipizide, metformin and  Actos will be on hold. Admit to telemetry. Monitor blood sugars every 2 hours. Continue D5 normal saline at 60 mL/h. The patient will have sliding-scale coverage if blood sugar more than 100 persistently for 2 times.  2.  Acute on chronic renal failure. At baseline, creatinine is around 2.5. We are going to get Nephrology on board. Continue IV fluids and hold the Lasix at this time, and check bmp  in the morning. Check the renal ultrasound.  3.  Anemia,  thrombocytopenia. The patient is seeing Dr. Grayland Ormond. Has followed up there with them recently, on October 14. The patient is supposed to get abdominal ultrasound for hepatosplenomegaly. I ordered that. The patient has mild iron deficiency anemia, getting replacement. The patient is on aspirin. I am going to hold that at this time due to the thrombocytopenia. Follow clinically and obtain oncology followup.  4.  Hypertension. Blood pressure is stable. He is on metoprolol. Continue the metoprolol.  5.  History of falls with ambulatory dysfunction. Get physical therapy evaluation.  6.  History of multiple sclerosis.  He is on Betaseron. Follows at [facility].  The patient is on Betaseron 0.3 mg subcutaneous every other day. Continue that.  7.  History of arthritis. The patient had history of fall recently. Does take tramadol. The patient's wife says that he has taken it for a long time, so we are going to continue tramadol 50 mg 4 times daily as needed.   TIME SPENT ON HISTORY AND PHYSICAL: About 60 minutes.    ____________________________ Epifanio Lesches, MD sk:dmm D: 08/27/2013 11:23:00 ET T: 08/27/2013 11:42:07 ET JOB#: [id number]  cc: Epifanio Lesches, MD, <Dictator> Epifanio Lesches MD ELECTRONICALLY SIGNED 09/24/2013 15:12

## 2015-02-14 NOTE — Discharge Summary (Signed)
PATIENT NAME:  Jeremy [name], Jeremy [name] MR#:  [id number] DATE OF BIRTH:  [birth year]  DATE OF ADMISSION:  08/27/2013 DATE OF DISCHARGE:    PRIMARY CARE PHYSICIAN:  Dr. Otilio Miu.  EMERGENCY ROOM PHYSICIAN:  Dr. Harvest Dark.  CHIEF COMPLAINT:  Low sugars.  HISTORY OF PRESENT ILLNESS: A 64 year old male patient with history of hypertension, diabetes, chronic kidney disease, brought from the home by EMS as the wife found him unconscious on the floor.  The patient's wife works third shift. Came home around 7:00 in the morning, found the patient unresponsive on the floor. The patient unable to give like what happened last night. According to the wife, he spoke to her at around 11:30, and then he switched off the TV and went to bed, found on the floor by the wife.  The patient did not lose consciousness, was alert, but slurred speech was noted. EMS arrived, and the patient's blood sugar was found to be 26, so EMS gave D50 and brought him to the Emergency Room. The patient had a history of fall like yesterday morning and this morning; and according to the wife, he has been, recently, unstable. No change in medications.  The patient had a fall like last week, and then seen in [facility]. Had x-rays of the ribs, but the patient says that he has to use the walker because he is feeling unstable, but denies any chest pain. No headache. No other problems. The patient takes glipizide 2.5 mg daily and metformin 1 gram p.o. b.i.d. Did take metformin last night. Denies any recent history of travel or recent history of fever.    No shortness of breath. No seizure activity.   PAST MEDICAL HISTORY:  Significant for:  1.  Hypertension.  2.  Diabetes.  3.  Multiple sclerosis.  4.  History of anemia.  5.  Thrombocytopenia, recently seeing Dr. Grayland Ormond and workup is in progress for that.  6.  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   ALLERGIES: DRUG ALLERGIES TO BETADINE.    SOCIAL HISTORY: No smoking. No drinking. Lives with the family. The patient is independent of activities of daily living. Does use a cane occasionally, but recently had to use walker due to falls and unstable gait.   PAST SURGICAL HISTORY: Significant for colon resection in 2009 for ischemic ulcer of hepatic flexure, supposed to be secondary to etodolac.   FAMILY HISTORY: Mother had lung cancer. Nephew recently diagnosed with stomach cancer.   MEDICATIONS:   1.  Aspirin 81 mg daily.  2.  Betaseron 0.3 mg subcutaneous every other day.  3.  Ferrous sulfate 325 mg p.o. t.i.d.  4.  Furosemide 40 mg p.o. daily. 5.  Gemfibrozil 600 mg p.o. b.i.d. 6.  Glipizide extended release 2.5 mg p.o. daily.  7.  Loratadine 10 mg p.o. daily.  8.  Lyrica 150 mg p.o. b.i.d.  9.  Metformin 1 gram p.o. b.i.d.  10.  Metoprolol 50 mg 1 tablet b.i.d.  11.  Omeprazole 40 mg p.o. daily.  12.  Actos 30 mg p.o. daily.  13.  Zoloft 50 mg p.o. daily.  14.  Simvastatin 40 mg p.o. daily.  15.  Tramadol 50 mg  4 times daily.  16.  Vitamin B12 every other week.   REVIEW OF SYSTEMS: CONSTITUTIONAL:  He feels fatigued and weak.  EYES: No blurred vision. The patient wears glasses.  ENT: No tinnitus. No epistaxis. No difficulty swallowing.  RESPIRATORY: No  cough. No wheezing.  CARDIOVASCULAR: No chest pain. No orthopnea. Does have pedal edema for a long time.  GASTROINTESTINAL: No nausea. No vomiting. No abdominal pain. The patient had a colon surgery. Denies any constipation. No hernias.  GENITOURINARY: No dysuria or hematuria. Noticed decreased urination recently for the last few days.   ENDOCRINE: No polyuria or nocturia.  HEMATOLOGIC:  He does have anemia and easy bruising. The patient is getting workup done with Dr. Grayland Ormond.  INTEGUMENT:  Does have a lot of bruises all over.  MUSCULOSKELETAL: Does have occasional joint pain.  NEUROLOGIC: Has MS, but denies any numbness or weakness that is more than usual.   PSYCHIATRIC: No anxiety or insomnia.   PHYSICAL EXAMINATION: VITAL SIGNS: Temperature 97.5, heart rate 69. Blood pressure is 134/64. Sats 94% on room air. The patients initial blood sugar reported by EMS was 26 and received D50. The patients repeat blood sugar is 65.  GENERAL:  The patient is alert, awake, oriented, answering questions appropriately. Wife thinks he is at the baseline.  HEENT: PERRLA. EOM intact. The patient has no scleral icterus. No conjunctivitis. Hearing is intact. Tympanic membranes clear. No oropharyngeal erythema. Mucous membranes are dry. NECK: No thyroid enlargement. No masses. No lymphadenopathy. No JVD. No carotid bruit.  RESPIRATORY: Clear to auscultation. No wheeze. No rales.  CARDIOVASCULAR: S1, S2 regular. No murmurs. PMI not displaced. The patient does have pedal edema up to knees, 2+. Good femoral pulses.  ABDOMEN: Soft, nontender, nondistended. Bowel sounds present. No hepatosplenomegaly is observed. No hernias. The patient has no bruit.   MUSCULOSKELETAL: Strength 5/5 in upper and lower extremities. Sensation is intact.  SKIN:  The patient does have a lot of ecchymotic spots present all over his body.  NEUROLOGIC: Cranial nerves II through XII intact. Power 5/5 in upper and lower extremities. Sensations are intact. DTRs 2+ bilaterally.  PSYCHIATRIC: Oriented to time, place, person.   LAB DATA: WBC 5.3, hemoglobin 8.9, hematocrit 26.2, platelets 72. Electrolytes: Sodium is 130, potassium 3.7, chloride 95, bicarb 25, BUN 61, creatinine 4.40, glucose 65. LFTs within normal range. Troponin slightly up at 0.12.   BNP up at 2880.  CT head shows chronic versus ischemic changes. No evidence of acute infarct. No mass effect.   The patient's EKG shows junctional rhythm with 70 beats per minute. No ST-T changes.   ASSESSMENT AND PLAN: The patient is a 64 year old male with hypoglycemia, altered mental status secondary to:  1.  Hypoglycemia. The patients glipizide,  metformin and Actos will be on hold. Admit to telemetry. Monitor blood sugars every 2 hours. Continue D5 normal saline at 60 mL/h. The patient will have sliding-scale coverage if blood sugar more than 100 persistently for 2 times.  2.  Acute on chronic renal failure. At baseline, creatinine is around 2.5. We are going to get Nephrology on board. Continue IV fluids and hold the Lasix at this time, and (Dictation Anomaly) <<MISSING TEXT>> in the morning. Check the renal ultrasound.  3.  Anemia,  thrombocytopenia. The patient is seeing Dr. Grayland Ormond. Has followed up there with them recently, on October 14. The patient is supposed to get abdominal ultrasound for hepatosplenomegaly. I ordered that. The patient has mild iron deficiency anemia, getting replacement. The patient is on aspirin. I am going to hold that at this time due to the thrombocytopenia. Follow clinically and obtain oncology followup.  4.  Hypertension. Blood pressure is stable. He is on metoprolol. Continue the metoprolol.  5.  History of falls with  ambulatory dysfunction. Get physical therapy evaluation.  6.  History of multiple sclerosis.  He is on Betaseron. Follows at [facility].  The patient is on Betaseron 0.3 mg subcutaneous every other day. Continue that.  7.  History of arthritis. The patient had history of fall recently. Does take tramadol. The patient's wife says that he has taken it for a long time, so we are going to continue tramadol 50 mg 4 times daily as needed.   TIME SPENT ON HISTORY AND PHYSICAL: About 60 minutes.   ____________________________ Epifanio Lesches, MD sk:dmm D: 08/27/2013 11:23:00 ET T: 08/27/2013 11:42:07 ET JOB#: [id number]  cc: Epifanio Lesches, MD, <Dictator>

## 2015-02-20 ENCOUNTER — Other Ambulatory Visit: Payer: Self-pay | Admitting: Neurology

## 2015-02-20 NOTE — Telephone Encounter (Signed)
Rx has been faxed.

## 2015-02-25 DIAGNOSIS — E538 Deficiency of other specified B group vitamins: Secondary | ICD-10-CM | POA: Diagnosis not present

## 2015-03-06 ENCOUNTER — Ambulatory Visit (INDEPENDENT_AMBULATORY_CARE_PROVIDER_SITE_OTHER): Payer: Medicare Other | Admitting: Nurse Practitioner

## 2015-03-06 ENCOUNTER — Encounter: Payer: Self-pay | Admitting: Nurse Practitioner

## 2015-03-06 VITALS — BP 122/60 | HR 58 | Wt 237.8 lb

## 2015-03-06 DIAGNOSIS — G35 Multiple sclerosis: Secondary | ICD-10-CM

## 2015-03-06 DIAGNOSIS — R269 Unspecified abnormalities of gait and mobility: Secondary | ICD-10-CM | POA: Diagnosis not present

## 2015-03-06 NOTE — Progress Notes (Signed)
I have reviewed and agreed above plan. 

## 2015-03-06 NOTE — Patient Instructions (Signed)
Continue Betaseron for MS every other day Moderate exercise if possible by walking use cane at all times for safe ambulation Follow-up in 6 months

## 2015-03-06 NOTE — Progress Notes (Signed)
[facility]  PATIENT: Jeremy [name] DOB: April 14, 1951   REASON FOR VISIT: Follow-up for multiple sclerosis, gait abnormality HISTORY FROM: Patient    HISTORY OF PRESENT ILLNESS:He has PMHx of diabetes, hypertension and multiple sclerosis. The diagnosis of multiple sclerosis was confirmed by abnormal MRI brain and cervical, and a spinal fluid testing. He was initially followed by Dr. Estella [name] in 2008. Last seen in this office Oct 2014, He continues to have gait difficulties and relies on a cane for ambulation. He has had no problems with his vision or bowel/bladder control.  He has had occasional falls.He has ongoing pain in his legs. Last MS flare was over 2 years ago. He is currently on Betaseron every other day, treatment started in 2007.   MRI cervical in 2013 with findings consistent with MS plaques and no enhancement,mild spinal stenosis.  MRI of the brain with prominent white matter changes consistent with MS. No active areas of demyelination. He continues to have problems with low   The initial symptom was acute onset of left leg more than arm weakness, gait difficulty in 2006. This eventually led to the diagnosis of relapsing remitting multiple sclerosis. He has been receiving Betaseron treatment since early 2007. He has tolerated the medication well. He has had no problems with lipoatrophy or skin necrosis. In September 2009, he developed GI bleeding. Workup had demonstrated colon inflammation and obstruction.He underwent colectomy and recovered well from that procedure.  He continues to have gait difficulties and relies on a cane for ambulation. He has had no problems with his vision or bowel/bladder control. He does have chronic extremity pain, which has been managed with Lyrica and Tramadol.   He uses an Transport planner for situations in which he would need to walk long distances. He has had occasional falls.He has ongoing pain in his legs     UPDATE 07/2014: He was admitted to hospital in Nov 2014 for anemia, low blood count, was evaluated by hematologist in [facility], now has recovered, he still has moderate gait difficulty.also complains bilateral lower extremity spasticity, is taking Lyrica, there was no clinical flareups, last MRI was in 2013, there was significant lesions in brain, and cervical spine   UPDATE 09/03/2014: He has tried Baclofen 45m qhs, complains of excessive sleepiness, could not tolerate it, he still mows the yard, the most limitation is his gait difficulty,he denies bowel and bladder incontinence.  We have reviewed MRI togather, MRI scan of brain showing periventricular and subcortical white matter hyperintensities consistent with multiple sclerosis. Presence of atrophy of corpus callosum indicates chronic disease. No significant change compared with MRI 06/02/2012   MRI cervical spine showing mild spondylitic changes descibed above most prominent at C 3-4 where there is moderate left formaminal narrowing. Ill defined spinal cord hypertintensities at C 3-4 to c 6-7 likely represent chronic multiple sclerosis inactive plaques. No significant change compared with MRI C spine 06/02/2012  UPDATE 03/06/15 [name] 64year old male returns for follow-up. He was last seen in this office by Dr. YKrista Blue11/07/2014. He was diagnosed with multiple sclerosis in 2006. He is currently on Betaseron every other day without injection site issues.. He still remains fairly active mowing the yard, denies any bowel or bladder incontinence. He denies any sensory changes,  double vision speech or swallowing difficulty. He ambulates with a cane. He returns for reevaluation     REVIEW OF SYSTEMS: Full 14 system review of systems performed and notable only for those listed, all others are neg:  Constitutional: Fatigue Cardiovascular: Leg swelling Ear/Nose/Throat: neg  Skin: neg Eyes: neg Respiratory: neg Gastroitestinal: neg   Hematology/Lymphatic: Easy bruising Endocrine: neg Musculoskeletal: Joint pain, aching muscles, walking difficulty Allergy/Immunology: neg Neurological: neg Psychiatric: neg Sleep : neg   ALLERGIES: Allergies  Allergen Reactions  . Betadine [Povidone Iodine]     HOME MEDICATIONS: Outpatient Prescriptions Prior to Visit  Medication Sig Dispense Refill  . aspirin 81 MG tablet Take 81 mg by mouth daily.    Marland Kitchen BETASERON 0.3 MG KIT injection Inject subcutaneously 1  syringe every other day 42 each 4  . Cyanocobalamin (VITAMIN B-12 IJ) Inject as directed. Every other week.    . Ferrous Sulfate (IRON SUPPLEMENT PO) Take by mouth 3 (three) times daily.    . Furosemide (LASIX PO) Take by mouth.    Marland Kitchen gemfibrozil (LOPID) 600 MG tablet Take 600 mg by mouth 2 (two) times daily before a meal.    . glipiZIDE (GLUCOTROL XL) 2.5 MG 24 hr tablet Take 2.5 mg by mouth daily.    Marland Kitchen LYRICA 150 MG capsule TAKE ONE CAPSULE BY MOUTH TWICE DAILY 60 capsule 5  . metoprolol tartrate (LOPRESSOR) 25 MG tablet Take 25 mg by mouth 2 (two) times daily.    Marland Kitchen omeprazole (PRILOSEC) 40 MG capsule     . PROAIR HFA 108 (90 BASE) MCG/ACT inhaler as needed.    . sertraline (ZOLOFT) 50 MG tablet Take 50 mg by mouth daily.    . simvastatin (ZOCOR) 40 MG tablet Take 40 mg by mouth every evening.    . traMADol (ULTRAM) 50 MG tablet Take 1 tablet (50 mg total) by mouth every 6 (six) hours as needed. for pain 120 tablet 5   No facility-administered medications prior to visit.    PAST MEDICAL HISTORY: Past Medical History  Diagnosis Date  . Depression   . Diabetes   . Hyperlipemia   . Hypertension   . Chronic pain   . MS (multiple sclerosis)     PAST SURGICAL HISTORY: Past Surgical History  Procedure Laterality Date  . Colectomy  06-2008    FAMILY HISTORY: Family History  Problem Relation Age of Onset  . Cancer Mother     SOCIAL HISTORY: History   Social History  . Marital Status: Married    Spouse  Name: [name]  . Number of Children: 2  . Years of Education: GED   Occupational History  .      Disabled   Social History Main Topics  . Smoking status: Former Smoker    Quit date: 01/23/2006  . Smokeless tobacco: Never Used  . Alcohol Use: 0.0 oz/week    0 Standard drinks or equivalent per week     Comment: rare  . Drug Use: No  . Sexual Activity: Not on file   Other Topics Concern  . Not on file   Social History Narrative   Patient is disabled.    Patient lives with his wife Fernandez Kenley.    Patient has 2 children.            PHYSICAL EXAM  Filed Vitals:   03/06/15 1328  BP: 122/60  Pulse: 58  Weight: 237 lb 12.8 oz (107.865 kg)   Body mass index is 34.12 kg/(m^2). Generalized: Well developed, obese male in no acute distress  Head: normocephalic and atraumatic, Oropharynx benign  Neck: Supple, no carotid bruits  Cardiac: Regular rate rhythm, no murmur  Musculoskeletal: Mild left hemiparesis Neurological examination   Mentation: Alert oriented  to time, place, history taking. Follows all commands speech and language fluent  Cranial nerve II-XII: Visual acuity 20/50 right, 20/30 left.Pupils were equal round reactive to light extraocular movements were full, visual field were full on confrontational test. Facial sensation and strength were normal. hearing was intact to finger rubbing bilaterally. Uvula tongue midline. head turning and shoulder shrug and were normal and symmetric.Tongue protrusion into cheek strength was normal. Motor: Mild left-sided weakness, left upper extremity fixation on rapid rotating movement,  Sensory: skin discoloration of distal leg, length dependent sensory changes. Coordination: finger-nose-finger, heel-to-shin bilaterally, no dysmetria Reflexes: Hyperreflexia of both lower extremity Gait and Station: wide based, stiff, steady with cane. No difficulty with turns  DIAGNOSTIC DATA (LABS, IMAGING, TESTING) - I reviewed patient  records, labs, notes, testing and imaging myself where available.  Lab Results  Component Value Date   WBC 3.8 07/30/2014   HGB 13.3 07/30/2014   HCT 38.5 07/30/2014   MCV 91 07/30/2014   PLT 140* 07/30/2014      Component Value Date/Time   NA 142 07/30/2014 1504   NA 143 08/31/2013 0417   K 4.3 07/30/2014 1504   K 3.5 08/31/2013 0417   CL 100 07/30/2014 1504   CL 109* 08/31/2013 0417   CO2 22 07/30/2014 1504   CO2 28 08/31/2013 0417   GLUCOSE 66 07/30/2014 1504   GLUCOSE 133* 08/31/2013 0417   BUN 31* 07/30/2014 1504   BUN 39* 08/31/2013 0417   CREATININE 1.88* 07/30/2014 1504   CREATININE 1.53* 08/31/2013 0417   CALCIUM 9.8 07/30/2014 1504   CALCIUM 8.9 08/31/2013 0417   PROT 7.6 07/30/2014 1504   PROT 6.2* 08/31/2013 0417   ALBUMIN 2.9* 08/31/2013 0417   AST 26 07/30/2014 1504   AST 35 08/31/2013 0417   ALT 17 07/30/2014 1504   ALT 15 08/31/2013 0417   ALKPHOS 95 07/30/2014 1504   ALKPHOS 60 08/31/2013 0417   BILITOT 0.3 07/30/2014 1504   GFRNONAA 37* 07/30/2014 1504   GFRNONAA 48* 08/31/2013 0417   GFRAA 32* 07/30/2014 1504   GFRAA 85* 08/31/2013 0417    ASSESSMENT AND PLAN  64 y.o. year old male  has a past medical history of multiple sclerosis, abnormal MRI of the cervical spine and brain most recent in 2015 without change from 2013. He is currently on Betaseron every other day.  Continue Betaseron for MS every other day Moderate exercise if possible by walking use cane at all times for safe ambulation Follow-up in 6 months Dennie Bible, [facility], [facility], APRN  [facility] [address] [place], [place] [address] [phone]

## 2015-03-11 DIAGNOSIS — E538 Deficiency of other specified B group vitamins: Secondary | ICD-10-CM | POA: Diagnosis not present

## 2015-03-25 DIAGNOSIS — E538 Deficiency of other specified B group vitamins: Secondary | ICD-10-CM | POA: Diagnosis not present

## 2015-04-08 ENCOUNTER — Ambulatory Visit (INDEPENDENT_AMBULATORY_CARE_PROVIDER_SITE_OTHER): Payer: Medicare Other

## 2015-04-08 DIAGNOSIS — D519 Vitamin B12 deficiency anemia, unspecified: Secondary | ICD-10-CM

## 2015-04-08 MED ORDER — CYANOCOBALAMIN 1000 MCG/ML IJ SOLN: 1000.0000 ug | Freq: Once | INTRAMUSCULAR | Status: DC

## 2015-04-18 ENCOUNTER — Other Ambulatory Visit: Payer: Self-pay | Admitting: Family Medicine

## 2015-04-18 DIAGNOSIS — E785 Hyperlipidemia, unspecified: Secondary | ICD-10-CM

## 2015-04-22 ENCOUNTER — Ambulatory Visit: Payer: Medicare Other

## 2015-04-22 DIAGNOSIS — D519 Vitamin B12 deficiency anemia, unspecified: Secondary | ICD-10-CM

## 2015-04-22 MED ORDER — CYANOCOBALAMIN 1000 MCG/ML IJ SOLN: 1000.0000 ug | Freq: Once | INTRAMUSCULAR | Status: DC

## 2015-04-30 DIAGNOSIS — R609 Edema, unspecified: Secondary | ICD-10-CM | POA: Diagnosis not present

## 2015-04-30 DIAGNOSIS — N183 Chronic kidney disease, stage 3 (moderate): Secondary | ICD-10-CM | POA: Diagnosis not present

## 2015-04-30 DIAGNOSIS — I270 Primary pulmonary hypertension: Secondary | ICD-10-CM | POA: Diagnosis not present

## 2015-05-06 ENCOUNTER — Ambulatory Visit (INDEPENDENT_AMBULATORY_CARE_PROVIDER_SITE_OTHER): Payer: Medicare Other

## 2015-05-06 DIAGNOSIS — D519 Vitamin B12 deficiency anemia, unspecified: Secondary | ICD-10-CM

## 2015-05-06 MED ORDER — CYANOCOBALAMIN 1000 MCG/ML IJ SOLN: 1000.0000 ug | Freq: Once | INTRAMUSCULAR | Status: DC

## 2015-05-13 ENCOUNTER — Other Ambulatory Visit: Payer: Self-pay | Admitting: Family Medicine

## 2015-05-14 ENCOUNTER — Other Ambulatory Visit: Payer: Self-pay | Admitting: Neurology

## 2015-05-16 ENCOUNTER — Other Ambulatory Visit: Payer: Self-pay

## 2015-05-16 MED ORDER — TRAMADOL HCL 50 MG PO TABS: 50.0000 mg | ORAL_TABLET | Freq: Four times a day (QID) | ORAL | Status: DC | PRN

## 2015-05-16 NOTE — Telephone Encounter (Signed)
Dr Krista Blue and Hoyle Sauer are both out of the office.  Request forwarded to [facility] for review.

## 2015-05-20 ENCOUNTER — Ambulatory Visit (INDEPENDENT_AMBULATORY_CARE_PROVIDER_SITE_OTHER): Payer: Medicare Other

## 2015-05-20 ENCOUNTER — Telehealth: Payer: Self-pay | Admitting: *Deleted

## 2015-05-20 ENCOUNTER — Other Ambulatory Visit: Payer: Self-pay

## 2015-05-20 DIAGNOSIS — D519 Vitamin B12 deficiency anemia, unspecified: Secondary | ICD-10-CM | POA: Diagnosis not present

## 2015-05-20 MED ORDER — TRAMADOL HCL 50 MG PO TABS: 50.0000 mg | ORAL_TABLET | Freq: Four times a day (QID) | ORAL | Status: DC | PRN

## 2015-05-20 MED ORDER — CYANOCOBALAMIN 1000 MCG/ML IJ SOLN: 1000.0000 ug | Freq: Once | INTRAMUSCULAR | Status: DC

## 2015-05-20 NOTE — Telephone Encounter (Signed)
Tramadol rx faxed and confirmed to pharmacy - [phone].

## 2015-05-27 ENCOUNTER — Other Ambulatory Visit: Payer: Self-pay

## 2015-05-27 DIAGNOSIS — R600 Localized edema: Secondary | ICD-10-CM

## 2015-05-27 MED ORDER — FUROSEMIDE 40 MG PO TABS: 40.0000 mg | ORAL_TABLET | Freq: Two times a day (BID) | ORAL | Status: DC

## 2015-06-03 ENCOUNTER — Ambulatory Visit (INDEPENDENT_AMBULATORY_CARE_PROVIDER_SITE_OTHER): Payer: Medicare Other | Admitting: Family Medicine

## 2015-06-03 ENCOUNTER — Encounter: Payer: Self-pay | Admitting: Family Medicine

## 2015-06-03 VITALS — BP 104/62 | HR 80 | Ht 70.0 in | Wt 241.0 lb

## 2015-06-03 DIAGNOSIS — I10 Essential (primary) hypertension: Secondary | ICD-10-CM | POA: Diagnosis not present

## 2015-06-03 DIAGNOSIS — E785 Hyperlipidemia, unspecified: Secondary | ICD-10-CM | POA: Diagnosis not present

## 2015-06-03 DIAGNOSIS — K219 Gastro-esophageal reflux disease without esophagitis: Secondary | ICD-10-CM | POA: Diagnosis not present

## 2015-06-03 DIAGNOSIS — E119 Type 2 diabetes mellitus without complications: Secondary | ICD-10-CM

## 2015-06-03 DIAGNOSIS — F329 Major depressive disorder, single episode, unspecified: Secondary | ICD-10-CM

## 2015-06-03 DIAGNOSIS — D509 Iron deficiency anemia, unspecified: Secondary | ICD-10-CM | POA: Insufficient documentation

## 2015-06-03 DIAGNOSIS — F32A Depression, unspecified: Secondary | ICD-10-CM

## 2015-06-03 DIAGNOSIS — R600 Localized edema: Secondary | ICD-10-CM | POA: Insufficient documentation

## 2015-06-03 DIAGNOSIS — R609 Edema, unspecified: Secondary | ICD-10-CM | POA: Diagnosis not present

## 2015-06-03 DIAGNOSIS — D519 Vitamin B12 deficiency anemia, unspecified: Secondary | ICD-10-CM | POA: Diagnosis not present

## 2015-06-03 DIAGNOSIS — J0100 Acute maxillary sinusitis, unspecified: Secondary | ICD-10-CM

## 2015-06-03 MED ORDER — SERTRALINE HCL 50 MG PO TABS: 50.0000 mg | ORAL_TABLET | Freq: Every day | ORAL | Status: DC

## 2015-06-03 MED ORDER — GEMFIBROZIL 600 MG PO TABS: 600.0000 mg | ORAL_TABLET | Freq: Two times a day (BID) | ORAL | Status: DC

## 2015-06-03 MED ORDER — OMEPRAZOLE 40 MG PO CPDR: 40.0000 mg | DELAYED_RELEASE_CAPSULE | Freq: Every day | ORAL | Status: DC

## 2015-06-03 MED ORDER — METOPROLOL TARTRATE 50 MG PO TABS: 25.0000 mg | ORAL_TABLET | Freq: Every day | ORAL | Status: DC

## 2015-06-03 MED ORDER — GUAIFENESIN-CODEINE 100-10 MG/5ML PO SOLN: 5.0000 mL | Freq: Three times a day (TID) | ORAL | Status: DC | PRN

## 2015-06-03 MED ORDER — CYANOCOBALAMIN 1000 MCG/ML IJ SOLN
1000.0000 ug | Freq: Once | INTRAMUSCULAR | Status: AC
  Administered 2015-06-03: 1000 ug via INTRAMUSCULAR

## 2015-06-03 MED ORDER — FUROSEMIDE 40 MG PO TABS: 40.0000 mg | ORAL_TABLET | Freq: Two times a day (BID) | ORAL | Status: DC

## 2015-06-03 MED ORDER — AMOXICILLIN 500 MG PO CAPS: 500.0000 mg | ORAL_CAPSULE | Freq: Three times a day (TID) | ORAL | Status: DC

## 2015-06-03 MED ORDER — SIMVASTATIN 40 MG PO TABS: 40.0000 mg | ORAL_TABLET | Freq: Every day | ORAL | Status: DC

## 2015-06-03 MED ORDER — GLIPIZIDE ER 2.5 MG PO TB24: 2.5000 mg | ORAL_TABLET | Freq: Every day | ORAL | Status: DC

## 2015-06-03 NOTE — Progress Notes (Signed)
Name: Ahnaf Caponi   MRN: [id number]    DOB: [birth year]   Date:06/03/2015       Progress Note  Subjective  Chief Complaint  Chief Complaint  Patient presents with  . Hypertension  . Hyperlipidemia  . Depression  . Diabetes  . Gastrophageal Reflux  . Sinusitis    needs something for cough and cong    Hypertension This is a recurrent problem. The current episode started more than 1 year ago. The problem has been gradually improving since onset. The problem is controlled. Pertinent negatives include no anxiety, blurred vision, chest pain, headaches, malaise/fatigue, neck pain, orthopnea, palpitations, peripheral edema, PND, shortness of breath or sweats. There are no associated agents to hypertension. Risk factors for coronary artery disease include dyslipidemia and diabetes mellitus. Past treatments include diuretics and beta blockers. The current treatment provides mild improvement. There are no compliance problems.  Hypertensive end-organ damage includes renovascular disease. There is no history of angina, kidney disease, CAD/MI, CVA, heart failure, left ventricular hypertrophy, PVD or retinopathy. Identifiable causes of hypertension include chronic renal disease.  Hyperlipidemia This is a recurrent problem. The current episode started more than 1 year ago. The problem is controlled. Recent lipid tests were reviewed and are normal. Exacerbating diseases include chronic renal disease. There are no known factors aggravating his hyperlipidemia. Pertinent negatives include no chest pain, focal sensory loss, leg pain, myalgias or shortness of breath. Current antihyperlipidemic treatment includes statins and fibric acid derivatives. The current treatment provides mild improvement of lipids. There are no compliance problems.   Depression        This is a chronic problem.  The current episode started more than 1 year ago.   The onset quality is gradual.   The problem occurs intermittently.  The problem  has been gradually improving since onset.  Associated symptoms include no decreased concentration, no fatigue, no helplessness, no hopelessness, does not have insomnia, not irritable, no restlessness, no decreased interest, no appetite change, no body aches, no myalgias, no headaches, no indigestion, not sad and no suicidal ideas.     The symptoms are aggravated by nothing.  Past treatments include SSRIs - Selective serotonin reuptake inhibitors.  Compliance with treatment is good.   Pertinent negatives include no anxiety. Diabetes He presents for his follow-up diabetic visit. He has type 2 diabetes mellitus. His disease course has been improving. Pertinent negatives for hypoglycemia include no confusion, dizziness, headaches, hunger, mood changes, nervousness/anxiousness, pallor, seizures, sleepiness, speech difficulty, sweats or tremors. Pertinent negatives for diabetes include no blurred vision, no chest pain, no fatigue, no foot paresthesias, no foot ulcerations, no polydipsia, no polyphagia, no polyuria, no visual change, no weakness and no weight loss. Symptoms are stable. Pertinent negatives for diabetic complications include no CVA, PVD or retinopathy. There are no known risk factors for coronary artery disease. Current diabetic treatment includes oral agent (monotherapy). His weight is stable. He is following a generally healthy diet. He monitors blood glucose at home 1-2 x per day. His breakfast blood glucose is taken between 8-9 am. His breakfast blood glucose range is generally 110-130 mg/dl. He does not see a podiatrist.Eye exam is not current.  Gastrophageal Reflux He reports no abdominal pain, no belching, no chest pain, no choking, no coughing, no dysphagia, no early satiety, no globus sensation, no heartburn, no hoarse voice, no nausea, no sore throat, no stridor, no tooth decay, no water brash or no wheezing. Nothing aggravates the symptoms. Pertinent negatives include no anemia, fatigue,  melena, muscle weakness or weight loss. There are no known risk factors. He has tried nothing for the symptoms. Past procedures do not include an abdominal ultrasound, an EGD, esophageal manometry, esophageal pH monitoring, H. pylori antibody titer or a UGI.  Sinusitis This is a recurrent problem. The current episode started in the past 7 days. The maximum temperature recorded prior to his arrival was 100.4 - 100.9 F. The pain is mild. Associated symptoms include chills, congestion and diaphoresis. Pertinent negatives include no coughing, ear pain, headaches, hoarse voice, neck pain, shortness of breath, sinus pressure, sneezing, sore throat or swollen glands. Past treatments include acetaminophen. The treatment provided mild relief.    No problem-specific assessment & plan notes found for this encounter.   Past Medical History  Diagnosis Date  . Depression   . Diabetes   . Hyperlipemia   . Hypertension   . Chronic pain   . MS (multiple sclerosis)   . GERD (gastroesophageal reflux disease)     Past Surgical History  Procedure Laterality Date  . Colectomy  06-2008  . Colonoscopy  2015    normal    Family History  Problem Relation Age of Onset  . Cancer Mother   . Heart attack Father     History   Social History  . Marital Status: Married    Spouse Name: [name]  . Number of Children: 2  . Years of Education: GED   Occupational History  .      Disabled   Social History Main Topics  . Smoking status: Former Smoker    Quit date: 01/23/2006  . Smokeless tobacco: Never Used  . Alcohol Use: 0.0 oz/week    0 Standard drinks or equivalent per week     Comment: rare  . Drug Use: No  . Sexual Activity: Not Currently   Other Topics Concern  . Not on file   Social History Narrative   Patient is disabled.    Patient lives with his wife Hays Dunnigan.    Patient has 2 children.           Allergies  Allergen Reactions  . Betadine [Povidone Iodine]      Review of  Systems  Constitutional: Positive for chills and diaphoresis. Negative for weight loss, malaise/fatigue, appetite change and fatigue.  HENT: Positive for congestion. Negative for ear pain, hoarse voice, sinus pressure, sneezing and sore throat.   Eyes: Negative for blurred vision.  Respiratory: Negative for cough, choking, shortness of breath and wheezing.   Cardiovascular: Negative for chest pain, palpitations, orthopnea and PND.  Gastrointestinal: Negative for heartburn, dysphagia, nausea, abdominal pain and melena.  Musculoskeletal: Negative for myalgias, muscle weakness and neck pain.  Skin: Negative for pallor.  Neurological: Negative for dizziness, tremors, seizures, speech difficulty, weakness and headaches.  Endo/Heme/Allergies: Negative for polydipsia and polyphagia.  Psychiatric/Behavioral: Positive for depression. Negative for suicidal ideas, confusion and decreased concentration. The patient is not nervous/anxious and does not have insomnia.      Objective  Filed Vitals:   06/03/15 0955  BP: 104/62  Pulse: 80  Height: 5\' 10"  (1.778 m)  Weight: 241 lb (109.317 kg)    Physical Exam  Constitutional: He is oriented to person, place, and time and well-developed, well-nourished, and in no distress. He is not irritable.  HENT:  Head: Normocephalic.  Right Ear: External ear normal.  Left Ear: External ear normal.  Nose: Nose normal.  Mouth/Throat: Oropharynx is clear and moist.  Eyes: Conjunctivae and EOM are  normal. Pupils are equal, round, and reactive to light. Right eye exhibits no discharge. Left eye exhibits no discharge. No scleral icterus.  Neck: Normal range of motion. Neck supple. No JVD present. No tracheal deviation present. No thyromegaly present.  Cardiovascular: Normal rate, regular rhythm, normal heart sounds and intact distal pulses.  Exam reveals no gallop and no friction rub.   No murmur heard. Pulmonary/Chest: Breath sounds normal. No respiratory distress.  He has no wheezes. He has no rales.  Abdominal: Soft. Bowel sounds are normal. He exhibits no mass. There is no hepatosplenomegaly. There is no tenderness. There is no rebound, no guarding and no CVA tenderness.  Musculoskeletal: Normal range of motion. He exhibits no edema or tenderness.  Lymphadenopathy:    He has no cervical adenopathy.  Neurological: He is alert and oriented to person, place, and time. He has normal sensation, normal strength, normal reflexes and intact cranial nerves. No cranial nerve deficit.  Skin: Skin is warm. No rash noted.  Psychiatric: Mood and affect normal.      Assessment & Plan  Problem List Items Addressed This Visit      Cardiovascular and Mediastinum   Essential hypertension   Relevant Medications   furosemide (LASIX) 40 MG tablet   gemfibrozil (LOPID) 600 MG tablet   simvastatin (ZOCOR) 40 MG tablet   metoprolol (LOPRESSOR) 50 MG tablet   Other Relevant Orders   Renal Function Panel     Digestive   Gastroesophageal reflux disease without esophagitis   Relevant Medications   omeprazole (PRILOSEC) 40 MG capsule     Endocrine   Type 2 diabetes mellitus without complication - Primary   Relevant Medications   simvastatin (ZOCOR) 40 MG tablet   glipiZIDE (GLUCOTROL XL) 2.5 MG 24 hr tablet   Other Relevant Orders   HgB A1c     Other   B12 deficiency anemia   Relevant Medications   cyanocobalamin ((VITAMIN B-12)) injection 1,000 mcg (Completed) (Start on 06/03/2015 10:45 AM)   Hyperlipidemia   Relevant Medications   furosemide (LASIX) 40 MG tablet   gemfibrozil (LOPID) 600 MG tablet   simvastatin (ZOCOR) 40 MG tablet   metoprolol (LOPRESSOR) 50 MG tablet   Other Relevant Orders   Lipid Profile   Depression   Relevant Medications   sertraline (ZOLOFT) 50 MG tablet   Edema extremities   Relevant Medications   furosemide (LASIX) 40 MG tablet    Other Visit Diagnoses    Acute maxillary sinusitis, recurrence not specified         Relevant Medications    amoxicillin (AMOXIL) 500 MG capsule    guaiFENesin-codeine 100-10 MG/5ML syrup         Dr. Sherron Mummert [facility]  06/03/2015

## 2015-06-04 ENCOUNTER — Other Ambulatory Visit: Payer: Self-pay

## 2015-06-04 LAB — LIPID PANEL
Chol/HDL Ratio: 4.2 ratio units (ref 0.0–5.0)
Cholesterol, Total: 148 mg/dL (ref 100–199)
HDL: 35 mg/dL — AB (ref >39)
LDL CALC: 90 mg/dL (ref 0–99)
Triglycerides: 117 mg/dL (ref 0–149)
VLDL CHOLESTEROL CAL: 23 mg/dL (ref 5–40)

## 2015-06-04 LAB — RENAL FUNCTION PANEL
Albumin: 4.5 g/dL (ref 3.6–4.8)
BUN/Creatinine Ratio: 20 (ref 10–22)
BUN: 33 mg/dL — AB (ref 8–27)
CO2: 26 mmol/L (ref 18–29)
CREATININE: 1.63 mg/dL — AB (ref 0.76–1.27)
Calcium: 9.3 mg/dL (ref 8.6–10.2)
Chloride: 97 mmol/L (ref 97–108)
GFR calc Af Amer: 51 mL/min/{1.73_m2} — ABNORMAL LOW (ref >59)
GFR, EST NON AFRICAN AMERICAN: 44 mL/min/{1.73_m2} — AB (ref >59)
Glucose: 105 mg/dL — ABNORMAL HIGH (ref 65–99)
Phosphorus: 2.7 mg/dL (ref 2.5–4.5)
Potassium: 4.1 mmol/L (ref 3.5–5.2)
Sodium: 142 mmol/L (ref 134–144)

## 2015-06-04 LAB — HEMOGLOBIN A1C
Est. average glucose Bld gHb Est-mCnc: 151 mg/dL
HEMOGLOBIN A1C: 6.9 % — AB (ref 4.8–5.6)

## 2015-06-17 ENCOUNTER — Ambulatory Visit (INDEPENDENT_AMBULATORY_CARE_PROVIDER_SITE_OTHER): Payer: Medicare Other

## 2015-06-17 DIAGNOSIS — D519 Vitamin B12 deficiency anemia, unspecified: Secondary | ICD-10-CM | POA: Diagnosis not present

## 2015-06-17 MED ORDER — CYANOCOBALAMIN 1000 MCG/ML IJ SOLN
1000.0000 ug | Freq: Once | INTRAMUSCULAR | Status: AC
  Administered 2015-06-17: 1000 ug via INTRAMUSCULAR

## 2015-07-01 ENCOUNTER — Ambulatory Visit (INDEPENDENT_AMBULATORY_CARE_PROVIDER_SITE_OTHER): Payer: Medicare Other

## 2015-07-01 DIAGNOSIS — D519 Vitamin B12 deficiency anemia, unspecified: Secondary | ICD-10-CM | POA: Diagnosis not present

## 2015-07-01 MED ORDER — CYANOCOBALAMIN 1000 MCG/ML IJ SOLN
1000.0000 ug | Freq: Once | INTRAMUSCULAR | Status: AC
  Administered 2015-07-01: 1000 ug via INTRAMUSCULAR

## 2015-07-15 ENCOUNTER — Ambulatory Visit (INDEPENDENT_AMBULATORY_CARE_PROVIDER_SITE_OTHER): Payer: Medicare Other

## 2015-07-15 DIAGNOSIS — D519 Vitamin B12 deficiency anemia, unspecified: Secondary | ICD-10-CM | POA: Diagnosis not present

## 2015-07-15 MED ORDER — CYANOCOBALAMIN 1000 MCG/ML IJ SOLN
1000.0000 ug | Freq: Once | INTRAMUSCULAR | Status: AC
  Administered 2015-07-15: 1000 ug via INTRAMUSCULAR

## 2015-07-28 ENCOUNTER — Ambulatory Visit (INDEPENDENT_AMBULATORY_CARE_PROVIDER_SITE_OTHER): Payer: Medicare Other

## 2015-07-28 DIAGNOSIS — D519 Vitamin B12 deficiency anemia, unspecified: Secondary | ICD-10-CM

## 2015-07-28 DIAGNOSIS — Z23 Encounter for immunization: Secondary | ICD-10-CM

## 2015-07-28 MED ORDER — CYANOCOBALAMIN 1000 MCG/ML IJ SOLN
1000.0000 ug | Freq: Once | INTRAMUSCULAR | Status: AC
  Administered 2015-07-28: 1000 ug via INTRAMUSCULAR

## 2015-08-12 ENCOUNTER — Ambulatory Visit (INDEPENDENT_AMBULATORY_CARE_PROVIDER_SITE_OTHER): Payer: Medicare Other

## 2015-08-12 DIAGNOSIS — D519 Vitamin B12 deficiency anemia, unspecified: Secondary | ICD-10-CM

## 2015-08-12 MED ORDER — CYANOCOBALAMIN 1000 MCG/ML IJ SOLN
1000.0000 ug | Freq: Once | INTRAMUSCULAR | Status: AC
  Administered 2015-08-12: 1000 ug via INTRAMUSCULAR

## 2015-08-26 ENCOUNTER — Ambulatory Visit (INDEPENDENT_AMBULATORY_CARE_PROVIDER_SITE_OTHER): Payer: Medicare Other

## 2015-08-26 DIAGNOSIS — D519 Vitamin B12 deficiency anemia, unspecified: Secondary | ICD-10-CM | POA: Diagnosis not present

## 2015-08-26 MED ORDER — CYANOCOBALAMIN 1000 MCG/ML IJ SOLN
1000.0000 ug | Freq: Once | INTRAMUSCULAR | Status: AC
  Administered 2015-08-26: 1000 ug via INTRAMUSCULAR

## 2015-09-09 ENCOUNTER — Ambulatory Visit (INDEPENDENT_AMBULATORY_CARE_PROVIDER_SITE_OTHER): Payer: Medicare Other

## 2015-09-09 DIAGNOSIS — D519 Vitamin B12 deficiency anemia, unspecified: Secondary | ICD-10-CM

## 2015-09-09 MED ORDER — CYANOCOBALAMIN 1000 MCG/ML IJ SOLN
1000.0000 ug | Freq: Once | INTRAMUSCULAR | Status: AC
  Administered 2015-09-09: 1000 ug via INTRAMUSCULAR

## 2015-09-11 ENCOUNTER — Ambulatory Visit (INDEPENDENT_AMBULATORY_CARE_PROVIDER_SITE_OTHER): Payer: Medicare Other | Admitting: Nurse Practitioner

## 2015-09-11 ENCOUNTER — Encounter: Payer: Self-pay | Admitting: Nurse Practitioner

## 2015-09-11 ENCOUNTER — Telehealth: Payer: Self-pay | Admitting: *Deleted

## 2015-09-11 VITALS — BP 122/68 | HR 54 | Ht 70.0 in | Wt 245.0 lb

## 2015-09-11 DIAGNOSIS — G35 Multiple sclerosis: Secondary | ICD-10-CM | POA: Diagnosis not present

## 2015-09-11 DIAGNOSIS — R269 Unspecified abnormalities of gait and mobility: Secondary | ICD-10-CM

## 2015-09-11 MED ORDER — INTERFERON BETA-1B 0.3 MG ~~LOC~~ KIT: PACK | SUBCUTANEOUS | Status: DC

## 2015-09-11 NOTE — Telephone Encounter (Signed)
Patient form on Michelle desk. 

## 2015-09-11 NOTE — Progress Notes (Signed)
[facility]  PATIENT: Jeremy [name] DOB: [birth year]   REASON FOR VISIT: Follow-up for multiple sclerosis, gait abnormality HISTORY FROM: Patient    HISTORY OF PRESENT ILLNESS: HISTORY:He has PMHx of diabetes, hypertension and multiple sclerosis. The diagnosis of multiple sclerosis was confirmed by abnormal MRI brain and cervical, and a spinal fluid testing. He was initially followed by Dr. Estella [name] in 2008. Last seen in this office Oct 2014, He continues to have gait difficulties and relies on a cane for ambulation. He has had no problems with his vision or bowel/bladder control.  He has had occasional falls.He has ongoing pain in his legs. Last MS flare was over 2 years ago. He is currently on Betaseron every other day, treatment started in 2007.   MRI cervical in 2013 with findings consistent with MS plaques and no enhancement,mild spinal stenosis.  MRI of the brain with prominent white matter changes consistent with MS. No active areas of demyelination. He continues to have problems with low   The initial symptom was acute onset of left leg more than arm weakness, gait difficulty in 2006. This eventually led to the diagnosis of relapsing remitting multiple sclerosis. He has been receiving Betaseron treatment since early 2007. He has tolerated the medication well. He has had no problems with lipoatrophy or skin necrosis. In September 2009, he developed GI bleeding. Workup had demonstrated colon inflammation and obstruction.He underwent colectomy and recovered well from that procedure.  He continues to have gait difficulties and relies on a cane for ambulation. He has had no problems with his vision or bowel/bladder control. He does have chronic extremity pain, which has been managed with Lyrica and Tramadol.   He uses an Transport planner for situations in which he would need to walk long distances. He has had occasional falls.He has ongoing pain in his legs    UPDATE 07/2014: He was admitted to hospital in Nov 2014 for anemia, low blood count, was evaluated by hematologist in [facility], now has recovered, he still has moderate gait difficulty.also complains bilateral lower extremity spasticity, is taking Lyrica, there was no clinical flareups, last MRI was in 2013, there was significant lesions in brain, and cervical spine  UPDATE 09/03/2014: He has tried Baclofen 63m qhs, complains of excessive sleepiness, could not tolerate it, he still mows the yard, the most limitation is his gait difficulty,he denies bowel and bladder incontinence.  We have reviewed MRI togather, MRI scan of brain showing periventricular and subcortical white matter hyperintensities consistent with multiple sclerosis. Presence of atrophy of corpus callosum indicates chronic disease. No significant change compared with MRI 06/02/2012   MRI cervical spine showing mild spondylitic changes descibed above most prominent at C 3-4 where there is moderate left formaminal narrowing. Ill defined spinal cord hypertintensities at C 3-4 to c 6-7 likely represent chronic multiple sclerosis inactive plaques. No significant change compared with MRI C spine 06/02/2012  UPDATE 11//17/16 [name] 64year old male returns for follow-up. He was last seen in this office 03/06/15.  He was diagnosed with multiple sclerosis in 2006. He is currently on Betaseron every other day without injection site issues.. He still remains fairly active mowing the yard, denies any bowel or bladder incontinence. He denies any sensory changes, double vision speech or swallowing difficulty. He ambulates with a cane. He has had a couple of falls since last seen. He returns for reevaluation   REVIEW OF SYSTEMS: Full 14 system review of systems performed and notable only for those listed, all  others are neg:  Constitutional: neg  Cardiovascular: neg Ear/Nose/Throat: neg  Skin: neg Eyes: neg Respiratory: neg Gastroitestinal: neg   Hematology/Lymphatic: neg  Endocrine: neg Musculoskeletal:neg Allergy/Immunology: neg Neurological: neg Psychiatric: neg Sleep : neg   ALLERGIES: Allergies  Allergen Reactions  . Betadine [Povidone Iodine]     HOME MEDICATIONS: Outpatient Prescriptions Prior to Visit  Medication Sig Dispense Refill  . aspirin 81 MG tablet Take 81 mg by mouth daily.    Marland Kitchen BETASERON 0.3 MG KIT injection Inject subcutaneously 1  syringe every other day 42 each 4  . Cyanocobalamin (VITAMIN B-12 IJ) Inject as directed. Every other week.    . Ferrous Sulfate (IRON SUPPLEMENT PO) Take by mouth 3 (three) times daily.    . furosemide (LASIX) 40 MG tablet Take 1 tablet (40 mg total) by mouth 2 (two) times daily. 180 tablet 0  . gemfibrozil (LOPID) 600 MG tablet Take 1 tablet (600 mg total) by mouth 2 (two) times daily before a meal. 180 tablet 1  . glipiZIDE (GLUCOTROL XL) 2.5 MG 24 hr tablet Take 1 tablet (2.5 mg total) by mouth daily. 90 tablet 1  . guaiFENesin-codeine 100-10 MG/5ML syrup Take 5 mLs by mouth 3 (three) times daily as needed for cough. 120 mL 0  . LYRICA 150 MG capsule TAKE ONE CAPSULE BY MOUTH TWICE DAILY 60 capsule 5  . metoprolol (LOPRESSOR) 50 MG tablet Take 0.5 tablets (25 mg total) by mouth daily. 45 tablet 1  . mupirocin ointment (BACTROBAN) 2 %     . omeprazole (PRILOSEC) 40 MG capsule Take 1 capsule (40 mg total) by mouth daily. 90 capsule 1  . sertraline (ZOLOFT) 50 MG tablet Take 1 tablet (50 mg total) by mouth daily. (Patient taking differently: Take 25 mg by mouth daily. ) 90 tablet 1  . simvastatin (ZOCOR) 40 MG tablet Take 1 tablet (40 mg total) by mouth daily. 90 tablet 1  . traMADol (ULTRAM) 50 MG tablet Take 1 tablet (50 mg total) by mouth every 6 (six) hours as needed. for pain (Patient taking differently: Take 50 mg by mouth every 6 (six) hours as needed. for pain- Neurology) 120 tablet 5  . amoxicillin (AMOXIL) 500 MG capsule Take 1 capsule (500 mg total) by mouth 3  (three) times daily. (Patient not taking: Reported on 09/11/2015) 30 capsule 0   Facility-Administered Medications Prior to Visit  Medication Dose Route Frequency Provider Last Rate Last Dose  . cyanocobalamin ((VITAMIN B-12)) injection 1,000 mcg  1,000 mcg Intramuscular Once Juline Patch, MD      . cyanocobalamin ((VITAMIN B-12)) injection 1,000 mcg  1,000 mcg Intramuscular Once Juline Patch, MD      . cyanocobalamin ((VITAMIN B-12)) injection 1,000 mcg  1,000 mcg Intramuscular Once Juline Patch, MD      . cyanocobalamin ((VITAMIN B-12)) injection 1,000 mcg  1,000 mcg Intramuscular Once Juline Patch, MD        PAST MEDICAL HISTORY: Past Medical History  Diagnosis Date  . Depression   . Diabetes ([place])   . Hyperlipemia   . Hypertension   . Chronic pain   . MS (multiple sclerosis) ([place])   . GERD (gastroesophageal reflux disease)     PAST SURGICAL HISTORY: Past Surgical History  Procedure Laterality Date  . Colectomy  06-2008  . Colonoscopy  2015    normal    FAMILY HISTORY: Family History  Problem Relation Age of Onset  . Cancer Mother   . Heart attack  Father     SOCIAL HISTORY: Social History   Social History  . Marital Status: Married    Spouse Name: [name]  . Number of Children: 2  . Years of Education: GED   Occupational History  .      Disabled   Social History Main Topics  . Smoking status: Former Smoker    Quit date: 01/23/2006  . Smokeless tobacco: Never Used  . Alcohol Use: 0.0 oz/week    0 Standard drinks or equivalent per week     Comment: rare  . Drug Use: No  . Sexual Activity: Not Currently   Other Topics Concern  . Not on file   Social History Narrative   Patient is disabled.    Patient lives with his wife Knoah Nedeau.    Patient has 2 children.            PHYSICAL EXAM  Filed Vitals:   09/11/15 1358  BP: 122/68  Pulse: 54  Height: 5' 10" (1.778 m)  Weight: 245 lb (111.131 kg)   Body mass index is 35.15  kg/(m^2). Generalized: Well developed, obese male in no acute distress  Head: normocephalic and atraumatic, Oropharynx benign  Neck: Supple, no carotid bruits  Cardiac: Regular rate rhythm, no murmur  Musculoskeletal: Mild left hemiparesis Neurological examination   Mentation: Alert oriented to time, place, history taking. Follows all commands speech and language fluent  Cranial nerve II-XII: Visual acuity 20/70 right, 20/40 left.Pupils were equal round reactive to light extraocular movements were full, visual field were full on confrontational test. Facial sensation and strength were normal. hearing was intact to finger rubbing bilaterally. Uvula tongue midline. head turning and shoulder shrug and were normal and symmetric.Tongue protrusion into cheek strength was normal. Motor: Mild left-sided weakness, left upper extremity fixation on rapid rotating movement,  Sensory: skin discoloration of distal leg, length dependent sensory changes. Coordination: finger-nose-finger, heel-to-shin bilaterally, no dysmetria Reflexes: Hyperreflexia of both lower extremity Gait and Station: wide based, stiff, steady with cane. No difficulty with turns  DIAGNOSTIC DATA (LABS, IMAGING, TESTING) -    Component Value Date/Time   NA 142 06/03/2015 1043   NA 143 08/31/2013 0417   K 4.1 06/03/2015 1043   K 3.5 08/31/2013 0417   CL 97 06/03/2015 1043   CL 109* 08/31/2013 0417   CO2 26 06/03/2015 1043   CO2 28 08/31/2013 0417   GLUCOSE 105* 06/03/2015 1043   GLUCOSE 133* 08/31/2013 0417   BUN 33* 06/03/2015 1043   BUN 39* 08/31/2013 0417   CREATININE 1.63* 06/03/2015 1043   CREATININE 1.53* 08/31/2013 0417   CALCIUM 9.3 06/03/2015 1043   CALCIUM 8.9 08/31/2013 0417   PROT 7.6 07/30/2014 1504   PROT 6.2* 08/31/2013 0417   ALBUMIN 4.5 06/03/2015 1043   ALBUMIN 2.9* 08/31/2013 0417   AST 26 07/30/2014 1504   AST 35 08/31/2013 0417   ALT 17 07/30/2014 1504   ALT 15 08/31/2013 0417   ALKPHOS 95  07/30/2014 1504   ALKPHOS 60 08/31/2013 0417   BILITOT 0.3 07/30/2014 1504   BILITOT 0.6 08/31/2013 0417   GFRNONAA 44* 06/03/2015 1043   GFRNONAA 48* 08/31/2013 0417   GFRAA 51* 06/03/2015 1043   GFRAA 56* 08/31/2013 0417   Lab Results  Component Value Date   CHOL 148 06/03/2015   HDL 35* 06/03/2015   LDLCALC 90 06/03/2015   TRIG 117 06/03/2015   CHOLHDL 4.2 06/03/2015   Lab Results  Component Value Date   HGBA1C 6.9*  06/03/2015    ASSESSMENT AND PLAN  64 y.o. year old male  has a past medical history of Depression; Diabetes ([place] Hyperlipemia; Hypertension; Chronic pain; MS (multiple sclerosis) ([place]); and  here to follow-up for his multiple sclerosis.  Continue Betaseron every other day CBC and CMP today to monitor adverse side effects of Betaseron Continue to use cane at all times for gait abnormality risk for falls offered physical therapy however he refuses Follow-up in 6 months next visit with Dr. Luan Pulling, [facility], [facility], [facility] [address] [place], [place] [address] [phone]

## 2015-09-11 NOTE — Patient Instructions (Signed)
Continue Betaseron every other day CBC and CMP today to monitor adverse side effects of Betaseron Continue to use cane at all times for gait abnormality risk for falls Follow-up in 6 months next visit with Dr. Krista Blue

## 2015-09-12 DIAGNOSIS — Z0289 Encounter for other administrative examinations: Secondary | ICD-10-CM

## 2015-09-12 LAB — CBC WITH DIFFERENTIAL/PLATELET
BASOS ABS: 0 10*3/uL (ref 0.0–0.2)
BASOS: 1 %
EOS (ABSOLUTE): 0.3 10*3/uL (ref 0.0–0.4)
Eos: 6 %
HEMOGLOBIN: 12.6 g/dL (ref 12.6–17.7)
Hematocrit: 37.2 % — ABNORMAL LOW (ref 37.5–51.0)
Immature Grans (Abs): 0 10*3/uL (ref 0.0–0.1)
Immature Granulocytes: 0 %
LYMPHS ABS: 1.5 10*3/uL (ref 0.7–3.1)
Lymphs: 31 %
MCH: 30.7 pg (ref 26.6–33.0)
MCHC: 33.9 g/dL (ref 31.5–35.7)
MCV: 91 fL (ref 79–97)
MONOCYTES: 10 %
Monocytes Absolute: 0.5 10*3/uL (ref 0.1–0.9)
NEUTROS ABS: 2.4 10*3/uL (ref 1.4–7.0)
Neutrophils: 52 %
Platelets: 157 10*3/uL (ref 150–379)
RBC: 4.1 x10E6/uL — ABNORMAL LOW (ref 4.14–5.80)
RDW: 14.5 % (ref 12.3–15.4)
WBC: 4.7 10*3/uL (ref 3.4–10.8)

## 2015-09-12 LAB — COMPREHENSIVE METABOLIC PANEL
A/G RATIO: 1.3 (ref 1.1–2.5)
ALBUMIN: 4.1 g/dL (ref 3.6–4.8)
ALT: 11 IU/L (ref 0–44)
AST: 24 IU/L (ref 0–40)
Alkaline Phosphatase: 80 IU/L (ref 39–117)
BUN/Creatinine Ratio: 16 (ref 10–22)
BUN: 22 mg/dL (ref 8–27)
Bilirubin Total: 0.3 mg/dL (ref 0.0–1.2)
CALCIUM: 9.3 mg/dL (ref 8.6–10.2)
CHLORIDE: 100 mmol/L (ref 97–106)
CO2: 23 mmol/L (ref 18–29)
CREATININE: 1.4 mg/dL — AB (ref 0.76–1.27)
GFR, EST AFRICAN AMERICAN: 61 mL/min/{1.73_m2} (ref >59)
GFR, EST NON AFRICAN AMERICAN: 53 mL/min/{1.73_m2} — AB (ref >59)
GLOBULIN, TOTAL: 3.2 g/dL (ref 1.5–4.5)
Glucose: 90 mg/dL (ref 65–99)
POTASSIUM: 4.1 mmol/L (ref 3.5–5.2)
Sodium: 144 mmol/L (ref 136–144)
TOTAL PROTEIN: 7.3 g/dL (ref 6.0–8.5)

## 2015-09-15 ENCOUNTER — Telehealth: Payer: Self-pay | Admitting: *Deleted

## 2015-09-15 NOTE — Telephone Encounter (Signed)
-----   Message from Dennie Bible, NP sent at 09/12/2015 11:08 AM EST ----- Labs to monitor [place] are good. Kidney function elevated

## 2015-09-15 NOTE — Telephone Encounter (Signed)
I called and spoke to pt and let him know his lab results.   His kidney function test are elevated.   Pt viewed on mychart.  He has appt with kidney doctor in [place] soon.

## 2015-09-16 ENCOUNTER — Telehealth: Payer: Self-pay | Admitting: *Deleted

## 2015-09-16 NOTE — Telephone Encounter (Signed)
Patient form mail out on 09/16/15 to [facility].

## 2015-09-17 NOTE — Progress Notes (Signed)
I have reviewed and agreed above plan. 

## 2015-09-23 ENCOUNTER — Ambulatory Visit (INDEPENDENT_AMBULATORY_CARE_PROVIDER_SITE_OTHER): Payer: Medicare Other

## 2015-09-23 DIAGNOSIS — E538 Deficiency of other specified B group vitamins: Secondary | ICD-10-CM | POA: Diagnosis not present

## 2015-09-23 MED ORDER — CYANOCOBALAMIN 1000 MCG/ML IJ SOLN
1000.0000 ug | Freq: Once | INTRAMUSCULAR | Status: AC
  Administered 2015-09-23: 1000 ug via INTRAMUSCULAR

## 2015-10-07 ENCOUNTER — Ambulatory Visit (INDEPENDENT_AMBULATORY_CARE_PROVIDER_SITE_OTHER): Payer: Medicare Other

## 2015-10-07 DIAGNOSIS — D519 Vitamin B12 deficiency anemia, unspecified: Secondary | ICD-10-CM | POA: Diagnosis not present

## 2015-10-07 MED ORDER — CYANOCOBALAMIN 1000 MCG/ML IJ SOLN
1000.0000 ug | Freq: Once | INTRAMUSCULAR | Status: AC
  Administered 2015-10-07: 1000 ug via INTRAMUSCULAR

## 2015-10-12 ENCOUNTER — Other Ambulatory Visit: Payer: Self-pay | Admitting: Family Medicine

## 2015-10-21 ENCOUNTER — Ambulatory Visit (INDEPENDENT_AMBULATORY_CARE_PROVIDER_SITE_OTHER): Payer: Medicare Other

## 2015-10-21 DIAGNOSIS — E538 Deficiency of other specified B group vitamins: Secondary | ICD-10-CM

## 2015-10-21 MED ORDER — CYANOCOBALAMIN 1000 MCG/ML IJ SOLN
1000.0000 ug | Freq: Once | INTRAMUSCULAR | Status: AC
  Administered 2015-10-21: 1000 ug via INTRAMUSCULAR

## 2015-10-23 ENCOUNTER — Encounter: Payer: Self-pay | Admitting: Family Medicine

## 2015-10-23 ENCOUNTER — Ambulatory Visit (INDEPENDENT_AMBULATORY_CARE_PROVIDER_SITE_OTHER): Payer: Medicare Other | Admitting: Family Medicine

## 2015-10-23 VITALS — BP 120/70 | HR 78 | Ht 70.0 in | Wt 244.0 lb

## 2015-10-23 DIAGNOSIS — J0100 Acute maxillary sinusitis, unspecified: Secondary | ICD-10-CM | POA: Diagnosis not present

## 2015-10-23 DIAGNOSIS — M5137 Other intervertebral disc degeneration, lumbosacral region: Secondary | ICD-10-CM

## 2015-10-23 MED ORDER — AMOXICILLIN-POT CLAVULANATE 875-125 MG PO TABS
1.0000 | ORAL_TABLET | Freq: Two times a day (BID) | ORAL | Status: DC
Start: 2015-10-23 — End: 2015-11-19

## 2015-10-23 MED ORDER — BENZONATATE 100 MG PO CAPS: 100.0000 mg | ORAL_CAPSULE | Freq: Two times a day (BID) | ORAL | Status: DC | PRN

## 2015-10-23 NOTE — Progress Notes (Signed)
Name: Jeremy [name]   MRN: [id number]    DOB: 06-Apr-1951   Date:10/23/2015       Progress Note  Subjective  Chief Complaint  Chief Complaint  Patient presents with  . Sinusitis    sore throat, sinus pressure, yellow/ thick production    Sinusitis This is a new problem. The current episode started in the past 7 days. The problem has been gradually worsening since onset. There has been no fever. His pain is at a severity of 2/10. The pain is mild. Associated symptoms include congestion, coughing, headaches, sinus pressure and a sore throat. Pertinent negatives include no chills, diaphoresis, ear pain, hoarse voice, neck pain, shortness of breath or swollen glands. Past treatments include nothing. The treatment provided no relief.  Back Pain This is a recurrent problem. The current episode started in the past 7 days. The problem occurs 2 to 4 times per day. The problem has been gradually worsening since onset. The pain is present in the lumbar spine. The pain radiates to the right thigh. The pain is moderate. The symptoms are aggravated by coughing. Associated symptoms include headaches, paresis and weakness. Pertinent negatives include no abdominal pain, bladder incontinence, bowel incontinence, chest pain, dysuria, fever, numbness, paresthesias, tingling or weight loss. Risk factors: hx of neuromuscular disorder. He has tried analgesics for the symptoms. The treatment provided mild relief.    No problem-specific assessment & plan notes found for this encounter.   Past Medical History  Diagnosis Date  . Depression   . Diabetes ([place])   . Hyperlipemia   . Hypertension   . Chronic pain   . MS (multiple sclerosis) ([place])   . GERD (gastroesophageal reflux disease)     Past Surgical History  Procedure Laterality Date  . Colectomy  06-2008  . Colonoscopy  2015    normal    Family History  Problem Relation Age of Onset  . Cancer Mother   . Heart attack Father     Social History    Social History  . Marital Status: Married    Spouse Name: [name]  . Number of Children: 2  . Years of Education: GED   Occupational History  .      Disabled   Social History Main Topics  . Smoking status: Former Smoker    Quit date: 01/23/2006  . Smokeless tobacco: Never Used  . Alcohol Use: 0.0 oz/week    0 Standard drinks or equivalent per week     Comment: rare  . Drug Use: No  . Sexual Activity: Not Currently   Other Topics Concern  . Not on file   Social History Narrative   Patient is disabled.    Patient lives with his wife Jeremy [name].    Patient has 2 children.           Allergies  Allergen Reactions  . Betadine [Povidone Iodine]      Review of Systems  Constitutional: Negative for fever, chills, weight loss, malaise/fatigue and diaphoresis.  HENT: Positive for congestion, sinus pressure and sore throat. Negative for ear discharge, ear pain and hoarse voice.   Eyes: Negative for blurred vision.  Respiratory: Positive for cough. Negative for sputum production, shortness of breath and wheezing.   Cardiovascular: Negative for chest pain, palpitations and leg swelling.  Gastrointestinal: Negative for heartburn, nausea, abdominal pain, diarrhea, constipation, blood in stool, melena and bowel incontinence.  Genitourinary: Negative for bladder incontinence, dysuria, urgency, frequency and hematuria.  Musculoskeletal: Positive for back pain. Negative  for myalgias, joint pain and neck pain.  Skin: Negative for rash.  Neurological: Positive for weakness and headaches. Negative for dizziness, tingling, sensory change, focal weakness, numbness and paresthesias.  Endo/Heme/Allergies: Negative for environmental allergies and polydipsia. Does not bruise/bleed easily.  Psychiatric/Behavioral: Negative for depression and suicidal ideas. The patient is not nervous/anxious and does not have insomnia.      Objective  Filed Vitals:   10/23/15 0759  BP: 120/70  Pulse:  78  Height: 5\' 10"  (1.778 m)  Weight: 244 lb (110.678 kg)    Physical Exam  Constitutional: He is oriented to person, place, and time and well-developed, well-nourished, and in no distress.  HENT:  Head: Normocephalic.  Right Ear: External ear normal.  Left Ear: External ear normal.  Nose: Nose normal.  Mouth/Throat: Oropharynx is clear and moist.  Eyes: Conjunctivae and EOM are normal. Pupils are equal, round, and reactive to light. Right eye exhibits no discharge. Left eye exhibits no discharge. No scleral icterus.  Neck: Normal range of motion. Neck supple. No JVD present. No tracheal deviation present. No thyromegaly present.  Cardiovascular: Normal rate, regular rhythm, normal heart sounds and intact distal pulses.  Exam reveals no gallop and no friction rub.   No murmur heard. Pulmonary/Chest: Breath sounds normal. No respiratory distress. He has no wheezes. He has no rales.  Abdominal: Soft. Bowel sounds are normal. He exhibits no mass. There is no hepatosplenomegaly. There is no tenderness. There is no rebound, no guarding and no CVA tenderness.  Musculoskeletal: Normal range of motion. He exhibits no edema or tenderness.  Lymphadenopathy:    He has no cervical adenopathy.  Neurological: He is alert and oriented to person, place, and time. He has normal sensation, normal strength, normal reflexes and intact cranial nerves. No cranial nerve deficit.  Skin: Skin is warm. No rash noted.  Psychiatric: Mood and affect normal.      Assessment & Plan  Problem List Items Addressed This Visit    None    Visit Diagnoses    Acute maxillary sinusitis, recurrence not specified    -  Primary    Relevant Medications    amoxicillin-clavulanate (AUGMENTIN) 875-125 MG tablet    benzonatate (TESSALON) 100 MG capsule    Disc degeneration, lumbosacral        tylenol    Relevant Medications    benzonatate (TESSALON) 100 MG capsule         Dr. [facility]  10/23/2015

## 2015-10-28 DIAGNOSIS — I270 Primary pulmonary hypertension: Secondary | ICD-10-CM | POA: Diagnosis not present

## 2015-10-28 DIAGNOSIS — E1129 Type 2 diabetes mellitus with other diabetic kidney complication: Secondary | ICD-10-CM | POA: Diagnosis not present

## 2015-10-28 DIAGNOSIS — R609 Edema, unspecified: Secondary | ICD-10-CM | POA: Diagnosis not present

## 2015-10-28 DIAGNOSIS — N183 Chronic kidney disease, stage 3 (moderate): Secondary | ICD-10-CM | POA: Diagnosis not present

## 2015-10-29 DIAGNOSIS — E1129 Type 2 diabetes mellitus with other diabetic kidney complication: Secondary | ICD-10-CM | POA: Diagnosis not present

## 2015-10-29 DIAGNOSIS — N183 Chronic kidney disease, stage 3 (moderate): Secondary | ICD-10-CM | POA: Diagnosis not present

## 2015-10-29 DIAGNOSIS — I270 Primary pulmonary hypertension: Secondary | ICD-10-CM | POA: Diagnosis not present

## 2015-10-29 DIAGNOSIS — R609 Edema, unspecified: Secondary | ICD-10-CM | POA: Diagnosis not present

## 2015-10-29 NOTE — Telephone Encounter (Signed)
Pt called back and says he spoke with [employer] and they did receive paper work. He is sorry for the confusion.

## 2015-10-29 NOTE — Telephone Encounter (Addendum)
Pt called said she rec'd letter from [facility] stating forms have not been rec'd. Please call at [phone]

## 2015-11-04 ENCOUNTER — Ambulatory Visit (INDEPENDENT_AMBULATORY_CARE_PROVIDER_SITE_OTHER): Payer: Medicare Other

## 2015-11-04 DIAGNOSIS — E538 Deficiency of other specified B group vitamins: Secondary | ICD-10-CM

## 2015-11-04 MED ORDER — CYANOCOBALAMIN 1000 MCG/ML IJ SOLN
1000.0000 ug | Freq: Once | INTRAMUSCULAR | Status: AC
  Administered 2015-11-04: 1000 ug via INTRAMUSCULAR

## 2015-11-18 ENCOUNTER — Ambulatory Visit (INDEPENDENT_AMBULATORY_CARE_PROVIDER_SITE_OTHER): Payer: Medicare Other

## 2015-11-18 DIAGNOSIS — D519 Vitamin B12 deficiency anemia, unspecified: Secondary | ICD-10-CM

## 2015-11-18 MED ORDER — CYANOCOBALAMIN 1000 MCG/ML IJ SOLN
1000.0000 ug | Freq: Once | INTRAMUSCULAR | Status: AC
  Administered 2015-11-18: 1000 ug via INTRAMUSCULAR

## 2015-11-19 ENCOUNTER — Ambulatory Visit (INDEPENDENT_AMBULATORY_CARE_PROVIDER_SITE_OTHER): Payer: Medicare Other | Admitting: Family Medicine

## 2015-11-19 ENCOUNTER — Encounter: Payer: Self-pay | Admitting: Family Medicine

## 2015-11-19 VITALS — BP 140/88 | HR 80 | Ht 70.0 in | Wt 245.0 lb

## 2015-11-19 DIAGNOSIS — I10 Essential (primary) hypertension: Secondary | ICD-10-CM | POA: Diagnosis not present

## 2015-11-19 DIAGNOSIS — G63 Polyneuropathy in diseases classified elsewhere: Secondary | ICD-10-CM | POA: Diagnosis not present

## 2015-11-19 DIAGNOSIS — F329 Major depressive disorder, single episode, unspecified: Secondary | ICD-10-CM

## 2015-11-19 DIAGNOSIS — R600 Localized edema: Secondary | ICD-10-CM

## 2015-11-19 DIAGNOSIS — K219 Gastro-esophageal reflux disease without esophagitis: Secondary | ICD-10-CM

## 2015-11-19 DIAGNOSIS — R609 Edema, unspecified: Secondary | ICD-10-CM

## 2015-11-19 DIAGNOSIS — E119 Type 2 diabetes mellitus without complications: Secondary | ICD-10-CM | POA: Diagnosis not present

## 2015-11-19 DIAGNOSIS — F32A Depression, unspecified: Secondary | ICD-10-CM

## 2015-11-19 DIAGNOSIS — E785 Hyperlipidemia, unspecified: Secondary | ICD-10-CM | POA: Diagnosis not present

## 2015-11-19 DIAGNOSIS — E349 Endocrine disorder, unspecified: Secondary | ICD-10-CM

## 2015-11-19 MED ORDER — SERTRALINE HCL 50 MG PO TABS: 25.0000 mg | ORAL_TABLET | Freq: Every day | ORAL | Status: DC

## 2015-11-19 MED ORDER — FUROSEMIDE 40 MG PO TABS: 40.0000 mg | ORAL_TABLET | Freq: Every day | ORAL | Status: DC

## 2015-11-19 MED ORDER — OMEPRAZOLE 40 MG PO CPDR: 40.0000 mg | DELAYED_RELEASE_CAPSULE | Freq: Every day | ORAL | Status: DC

## 2015-11-19 MED ORDER — GLIPIZIDE 5 MG PO TABS: 5.0000 mg | ORAL_TABLET | Freq: Every day | ORAL | Status: DC

## 2015-11-19 MED ORDER — GEMFIBROZIL 600 MG PO TABS: 600.0000 mg | ORAL_TABLET | Freq: Two times a day (BID) | ORAL | Status: DC

## 2015-11-19 MED ORDER — METOPROLOL TARTRATE 50 MG PO TABS: 25.0000 mg | ORAL_TABLET | Freq: Every day | ORAL | Status: DC

## 2015-11-19 MED ORDER — PREGABALIN 150 MG PO CAPS: 150.0000 mg | ORAL_CAPSULE | Freq: Two times a day (BID) | ORAL | Status: DC

## 2015-11-19 NOTE — Progress Notes (Signed)
Name: Jeremy [name]   MRN: [id number]    DOB: 23-Jun-1951   Date:11/19/2015       Progress Note  Subjective  Chief Complaint  Chief Complaint  Patient presents with  . Diabetes  . Gastroesophageal Reflux  . Hypertension  . Hyperlipidemia  . b12 deficiency anemia    Diabetes He presents for his follow-up diabetic visit. He has type 2 diabetes mellitus. There are no hypoglycemic associated symptoms. Pertinent negatives for hypoglycemia include no dizziness, headaches, nervousness/anxiousness or sweats. Pertinent negatives for diabetes include no blurred vision, no chest pain, no fatigue, no foot paresthesias, no foot ulcerations, no polydipsia, no polyphagia, no polyuria, no visual change, no weakness and no weight loss. There are no hypoglycemic complications. There are no diabetic complications. Pertinent negatives for diabetic complications include no CVA, PVD or retinopathy. There are no known risk factors for coronary artery disease. Current diabetic treatment includes oral agent (monotherapy). He has not had a previous visit with a dietitian. He participates in exercise intermittently. His breakfast blood glucose is taken between 8-9 am. His breakfast blood glucose range is generally 140-180 mg/dl.  Gastroesophageal Reflux He reports no abdominal pain, no belching, no chest pain, no coughing, no heartburn, no nausea, no sore throat, no stridor or no wheezing. The problem occurs rarely. Nothing aggravates the symptoms. Pertinent negatives include no fatigue, melena or weight loss. He has tried a PPI for the symptoms. The treatment provided mild relief.  Hypertension This is a chronic problem. The current episode started more than 1 year ago. The problem is unchanged. The problem is controlled. Pertinent negatives include no anxiety, blurred vision, chest pain, headaches, malaise/fatigue, neck pain, orthopnea, palpitations, peripheral edema, PND, shortness of breath or sweats. Risk factors for  coronary artery disease include diabetes mellitus, dyslipidemia and obesity. Past treatments include beta blockers and diuretics. Hypertensive end-organ damage includes renovascular disease. There is no history of angina, kidney disease, CAD/MI, CVA, heart failure, left ventricular hypertrophy, PVD or retinopathy. Identifiable causes of hypertension include chronic renal disease. There is no history of a hypertension causing med.  Hyperlipidemia This is a chronic problem. The problem is controlled. Exacerbating diseases include chronic renal disease, diabetes and obesity. He has no history of hypothyroidism. Factors aggravating his hyperlipidemia include beta blockers. Pertinent negatives include no chest pain, focal sensory loss, focal weakness, myalgias or shortness of breath. The current treatment provides moderate improvement of lipids. There are no compliance problems.     No problem-specific assessment & plan notes found for this encounter.   Past Medical History  Diagnosis Date  . Depression   . Diabetes ([place])   . Hyperlipemia   . Hypertension   . Chronic pain   . MS (multiple sclerosis) ([place])   . GERD (gastroesophageal reflux disease)     Past Surgical History  Procedure Laterality Date  . Colectomy  06-2008  . Colonoscopy  2015    normal    Family History  Problem Relation Age of Onset  . Cancer Mother   . Heart attack Father     Social History   Social History  . Marital Status: Married    Spouse Name: Jeremy [name]  . Number of Children: 2  . Years of Education: GED   Occupational History  .      Disabled   Social History Main Topics  . Smoking status: Former Smoker    Quit date: 01/23/2006  . Smokeless tobacco: Never Used  . Alcohol Use: 0.0 oz/week  0 Standard drinks or equivalent per week     Comment: rare  . Drug Use: No  . Sexual Activity: Not Currently   Other Topics Concern  . Not on file   Social History Narrative   Patient is disabled.     Patient lives with his wife Jeremy [name].    Patient has 2 children.           Allergies  Allergen Reactions  . Betadine [Povidone Iodine]      Review of Systems  Constitutional: Negative for fever, chills, weight loss, malaise/fatigue and fatigue.  HENT: Negative for ear discharge, ear pain and sore throat.   Eyes: Negative for blurred vision.  Respiratory: Negative for cough, sputum production, shortness of breath and wheezing.   Cardiovascular: Negative for chest pain, palpitations, orthopnea, leg swelling and PND.  Gastrointestinal: Negative for heartburn, nausea, abdominal pain, diarrhea, constipation, blood in stool and melena.  Genitourinary: Negative for dysuria, urgency, frequency and hematuria.  Musculoskeletal: Negative for myalgias, back pain, joint pain and neck pain.  Skin: Negative for rash.  Neurological: Negative for dizziness, tingling, sensory change, focal weakness, weakness and headaches.  Endo/Heme/Allergies: Negative for environmental allergies, polydipsia and polyphagia. Does not bruise/bleed easily.  Psychiatric/Behavioral: Negative for depression and suicidal ideas. The patient is not nervous/anxious and does not have insomnia.      Objective  Filed Vitals:   11/19/15 0830  BP: 140/88  [name]: 80  Height: 5\' 10"  (1.778 m)  Weight: 245 lb (111.131 kg)    Physical Exam  Constitutional: He is oriented to person, place, and time and well-developed, well-nourished, and in no distress.  HENT:  Head: Normocephalic.  Right Ear: External ear normal.  Left Ear: External ear normal.  Nose: Nose normal.  Mouth/Throat: Oropharynx is clear and moist.  Eyes: Conjunctivae and EOM are normal. Pupils are equal, round, and reactive to light. Right eye exhibits no discharge. Left eye exhibits no discharge. No scleral icterus.  Neck: Normal range of motion. Neck supple. No JVD present. No tracheal deviation present. No thyromegaly present.  Cardiovascular: Normal  rate, regular rhythm, normal heart sounds and intact distal pulses.  Exam reveals no gallop and no friction rub.   No murmur heard. Pulmonary/Chest: Breath sounds normal. No respiratory distress. He has no wheezes. He has no rales.  Abdominal: Soft. Bowel sounds are normal. He exhibits no mass. There is no hepatosplenomegaly. There is no tenderness. There is no rebound, no guarding and no CVA tenderness.  Musculoskeletal: Normal range of motion. He exhibits no edema or tenderness.  Lymphadenopathy:    He has no cervical adenopathy.  Neurological: He is alert and oriented to person, place, and time. He has normal sensation, normal strength, normal reflexes and intact cranial nerves. No cranial nerve deficit.  Skin: Skin is warm. No rash noted.  Psychiatric: Mood and affect normal.      Assessment & Plan  Problem List Items Addressed This Visit      Cardiovascular and Mediastinum   Essential hypertension - Primary   Relevant Medications   metoprolol (LOPRESSOR) 50 MG tablet   gemfibrozil (LOPID) 600 MG tablet   furosemide (LASIX) 40 MG tablet     Digestive   Gastroesophageal reflux disease without esophagitis   Relevant Medications   docusate sodium (COLACE) 50 MG capsule   omeprazole (PRILOSEC) 40 MG capsule     Endocrine   Type 2 diabetes mellitus without complication (HCC)   Relevant Medications   glipiZIDE (GLUCOTROL) 5 MG  tablet     Nervous and Auditory   Neuropathy associated with endocrine disorder (HCC)     Other   Hyperlipidemia   Relevant Medications   metoprolol (LOPRESSOR) 50 MG tablet   gemfibrozil (LOPID) 600 MG tablet   furosemide (LASIX) 40 MG tablet   Depression   Relevant Medications   sertraline (ZOLOFT) 50 MG tablet   Edema extremities   Relevant Medications   furosemide (LASIX) 40 MG tablet        Dr. Fatumata Kashani [facility]  11/19/2015

## 2015-11-24 ENCOUNTER — Other Ambulatory Visit: Payer: Self-pay | Admitting: Family Medicine

## 2015-12-03 ENCOUNTER — Ambulatory Visit (INDEPENDENT_AMBULATORY_CARE_PROVIDER_SITE_OTHER): Payer: Medicare Other | Admitting: Family Medicine

## 2015-12-03 ENCOUNTER — Encounter: Payer: Self-pay | Admitting: Family Medicine

## 2015-12-03 VITALS — BP 114/68 | HR 80 | Ht 70.0 in | Wt 245.0 lb

## 2015-12-03 DIAGNOSIS — E1142 Type 2 diabetes mellitus with diabetic polyneuropathy: Secondary | ICD-10-CM | POA: Diagnosis not present

## 2015-12-03 MED ORDER — CYANOCOBALAMIN 1000 MCG/ML IJ SOLN
1000.0000 ug | Freq: Once | INTRAMUSCULAR | Status: AC
  Administered 2015-12-03: 1000 ug via INTRAMUSCULAR

## 2015-12-03 NOTE — Progress Notes (Signed)
Name: Jeremy [name]   MRN: [id number]    DOB: 04/17/1951   Date:12/03/2015       Progress Note  Subjective  Chief Complaint  Chief Complaint  Patient presents with  . Diabetes    increased Glipizide to 5mg      Diabetes He presents for his follow-up diabetic visit. He has type 2 diabetes mellitus. His disease course has been fluctuating. Pertinent negatives for hypoglycemia include no confusion, dizziness, headaches, hunger, mood changes, nervousness/anxiousness, pallor, seizures, sleepiness, sweats or tremors. Associated symptoms include blurred vision and foot paresthesias. Pertinent negatives for diabetes include no chest pain, no fatigue, no foot ulcerations, no polydipsia, no polyphagia, no polyuria, no visual change, no weakness and no weight loss. There are no hypoglycemic complications. Symptoms are stable. There are no diabetic complications. Current diabetic treatment includes diet and oral agent (monotherapy). He is compliant with treatment all of the time. His weight is stable. He is following a generally healthy diet. His home blood glucose trend is fluctuating minimally. His breakfast blood glucose is taken between 8-9 am. His breakfast blood glucose range is generally 140-180 mg/dl. An ACE inhibitor/angiotensin II receptor blocker is being taken.    No problem-specific assessment & plan notes found for this encounter.   Past Medical History  Diagnosis Date  . Depression   . Diabetes ([place])   . Hyperlipemia   . Hypertension   . Chronic pain   . MS (multiple sclerosis) ([place])   . GERD (gastroesophageal reflux disease)     Past Surgical History  Procedure Laterality Date  . Colectomy  06-2008  . Colonoscopy  2015    normal    Family History  Problem Relation Age of Onset  . Cancer Mother   . Heart attack Father     Social History   Social History  . Marital Status: Married    Spouse Name: [name]  . Number of Children: 2  . Years of Education: GED   Occupational  History  .      Disabled   Social History Main Topics  . Smoking status: Former Smoker    Quit date: 01/23/2006  . Smokeless tobacco: Never Used  . Alcohol Use: 0.0 oz/week    0 Standard drinks or equivalent per week     Comment: rare  . Drug Use: No  . Sexual Activity: Not Currently   Other Topics Concern  . Not on file   Social History Narrative   Patient is disabled.    Patient lives with his wife Jeremy [name].    Patient has 2 children.           Allergies  Allergen Reactions  . Betadine [Povidone Iodine]      Review of Systems  Constitutional: Negative for fever, chills, weight loss, malaise/fatigue and fatigue.  HENT: Negative for ear discharge, ear pain and sore throat.   Eyes: Positive for blurred vision.  Respiratory: Negative for cough, sputum production, shortness of breath and wheezing.   Cardiovascular: Negative for chest pain, palpitations and leg swelling.  Gastrointestinal: Negative for heartburn, nausea, abdominal pain, diarrhea, constipation, blood in stool and melena.  Genitourinary: Negative for dysuria, urgency, frequency and hematuria.  Musculoskeletal: Negative for myalgias, back pain, joint pain and neck pain.  Skin: Negative for pallor and rash.  Neurological: Negative for dizziness, tingling, tremors, sensory change, focal weakness, seizures, weakness and headaches.  Endo/Heme/Allergies: Negative for environmental allergies, polydipsia and polyphagia. Does not bruise/bleed easily.  Psychiatric/Behavioral: Negative for depression, suicidal ideas  and confusion. The patient is not nervous/anxious and does not have insomnia.      Objective  Filed Vitals:   12/03/15 0810  BP: 114/68  Pulse: 80  Height: 5\' 10"  (1.778 m)  Weight: 245 lb (111.131 kg)    Physical Exam  Constitutional: He is oriented to person, place, and time and well-developed, well-nourished, and in no distress.  HENT:  Head: Normocephalic.  Right Ear: External ear  normal.  Left Ear: External ear normal.  Nose: Nose normal.  Mouth/Throat: Oropharynx is clear and moist.  Eyes: Conjunctivae and EOM are normal. Pupils are equal, round, and reactive to light. Right eye exhibits no discharge. Left eye exhibits no discharge. No scleral icterus.  Neck: Normal range of motion. Neck supple. No JVD present. No tracheal deviation present. No thyromegaly present.  Cardiovascular: Normal rate, regular rhythm, normal heart sounds and intact distal pulses.  Exam reveals no gallop and no friction rub.   No murmur heard. Pulmonary/Chest: Breath sounds normal. No respiratory distress. He has no wheezes. He has no rales.  Abdominal: Soft. Bowel sounds are normal. He exhibits no mass. There is no hepatosplenomegaly. There is no tenderness. There is no rebound, no guarding and no CVA tenderness.  Musculoskeletal: Normal range of motion. He exhibits no edema or tenderness.  Lymphadenopathy:    He has no cervical adenopathy.  Neurological: He is alert and oriented to person, place, and time. He has normal sensation, normal strength and intact cranial nerves. No cranial nerve deficit.  Skin: Skin is warm. No rash noted.  Psychiatric: Mood and affect normal.      Assessment & Plan  Problem List Items Addressed This Visit      Endocrine   Type 2 diabetes mellitus with neurologic complication, without long-term current use of insulin ([place]) - Primary   Relevant Medications   cyanocobalamin ((VITAMIN B-12)) injection 1,000 mcg (Completed)   Other Relevant Orders   Microalbumin / creatinine urine ratio        Dr. Felise Georgia [facility]  12/03/2015

## 2015-12-04 LAB — MICROALBUMIN / CREATININE URINE RATIO
Creatinine, Urine: 114.5 mg/dL
MICROALB/CREAT RATIO: 3.2 mg/g creat (ref 0.0–30.0)
MICROALBUM., U, RANDOM: 3.7 ug/mL

## 2015-12-05 ENCOUNTER — Other Ambulatory Visit: Payer: Self-pay | Admitting: Neurology

## 2015-12-16 ENCOUNTER — Ambulatory Visit (INDEPENDENT_AMBULATORY_CARE_PROVIDER_SITE_OTHER): Payer: Medicare Other

## 2015-12-16 DIAGNOSIS — D519 Vitamin B12 deficiency anemia, unspecified: Secondary | ICD-10-CM | POA: Diagnosis not present

## 2015-12-16 MED ORDER — CYANOCOBALAMIN 1000 MCG/ML IJ SOLN
1000.0000 ug | Freq: Once | INTRAMUSCULAR | Status: AC
  Administered 2015-12-16: 1000 ug via INTRAMUSCULAR

## 2016-01-01 ENCOUNTER — Ambulatory Visit
Admission: RE | Admit: 2016-01-01 | Discharge: 2016-01-01 | Disposition: A | Discharge status: Home / Self Care | Payer: Medicare Other | Source: Ambulatory Visit | Attending: Family Medicine | Admitting: Family Medicine

## 2016-01-01 ENCOUNTER — Ambulatory Visit (INDEPENDENT_AMBULATORY_CARE_PROVIDER_SITE_OTHER): Payer: Medicare Other | Admitting: Family Medicine

## 2016-01-01 ENCOUNTER — Encounter: Payer: Self-pay | Admitting: Family Medicine

## 2016-01-01 VITALS — BP 130/80 | HR 70 | Ht 70.0 in | Wt 246.0 lb

## 2016-01-01 DIAGNOSIS — G63 Polyneuropathy in diseases classified elsewhere: Secondary | ICD-10-CM | POA: Diagnosis not present

## 2016-01-01 DIAGNOSIS — D519 Vitamin B12 deficiency anemia, unspecified: Secondary | ICD-10-CM

## 2016-01-01 DIAGNOSIS — E1142 Type 2 diabetes mellitus with diabetic polyneuropathy: Secondary | ICD-10-CM | POA: Diagnosis not present

## 2016-01-01 DIAGNOSIS — I10 Essential (primary) hypertension: Secondary | ICD-10-CM | POA: Diagnosis not present

## 2016-01-01 DIAGNOSIS — E349 Endocrine disorder, unspecified: Secondary | ICD-10-CM | POA: Diagnosis not present

## 2016-01-01 DIAGNOSIS — M5137 Other intervertebral disc degeneration, lumbosacral region: Secondary | ICD-10-CM | POA: Insufficient documentation

## 2016-01-01 DIAGNOSIS — M5136 Other intervertebral disc degeneration, lumbar region: Secondary | ICD-10-CM | POA: Diagnosis not present

## 2016-01-01 DIAGNOSIS — M519 Unspecified thoracic, thoracolumbar and lumbosacral intervertebral disc disorder: Secondary | ICD-10-CM | POA: Insufficient documentation

## 2016-01-01 DIAGNOSIS — M4647 Discitis, unspecified, lumbosacral region: Secondary | ICD-10-CM | POA: Diagnosis not present

## 2016-01-01 MED ORDER — CYANOCOBALAMIN 1000 MCG/ML IJ SOLN
1000.0000 ug | Freq: Once | INTRAMUSCULAR | Status: AC
  Administered 2016-01-01: 1000 ug via INTRAMUSCULAR

## 2016-01-01 MED ORDER — PREGABALIN 200 MG PO CAPS: 200.0000 mg | ORAL_CAPSULE | Freq: Two times a day (BID) | ORAL | Status: DC

## 2016-01-01 NOTE — Progress Notes (Signed)
Name: Jeremy [name]   MRN: [id number]    DOB: 02-26-1951   Date:01/01/2016       Progress Note  Subjective  Chief Complaint  Chief Complaint  Patient presents with  . Diabetes    sugars are up and down- range from 111- 243. Leg and feet pain have increased    Diabetes He presents for his follow-up diabetic visit. He has type 2 diabetes mellitus. His disease course has been stable. Pertinent negatives for hypoglycemia include no confusion, dizziness, headaches, hunger, mood changes, nervousness/anxiousness, pallor, seizures, sleepiness, speech difficulty, sweats or tremors. Pertinent negatives for diabetes include no blurred vision, no chest pain, no fatigue, no foot paresthesias, no foot ulcerations, no polydipsia, no polyphagia, no polyuria, no visual change, no weakness and no weight loss. There are no hypoglycemic complications. Symptoms are stable. Pertinent negatives for diabetic complications include no autonomic neuropathy, CVA, heart disease, impotence, nephropathy, peripheral neuropathy, PVD or retinopathy. Risk factors for coronary artery disease include diabetes mellitus and hypertension. Current diabetic treatment includes oral agent (monotherapy). He is compliant with treatment all of the time. His weight is stable. He is following a generally healthy diet. His breakfast blood glucose is taken between 7-8 am. His breakfast blood glucose range is generally 110-130 mg/dl.  Back Pain This is a recurrent problem. The current episode started more than 1 month ago. The problem occurs daily. The problem has been waxing and waning since onset. The pain is present in the lumbar spine. The quality of the pain is described as burning and aching. Pertinent negatives include no abdominal pain, chest pain, dysuria, fever, headaches, tingling, weakness or weight loss. The treatment provided mild relief.    No problem-specific assessment & plan notes found for this encounter.   Past Medical History   Diagnosis Date  . Depression   . Diabetes ([place])   . Hyperlipemia   . Hypertension   . Chronic pain   . MS (multiple sclerosis) ([place])   . GERD (gastroesophageal reflux disease)     Past Surgical History  Procedure Laterality Date  . Colectomy  06-2008  . Colonoscopy  2015    normal    Family History  Problem Relation Age of Onset  . Cancer Mother   . Heart attack Father     Social History   Social History  . Marital Status: Married    Spouse Name: [name]  . Number of Children: 2  . Years of Education: GED   Occupational History  .      Disabled   Social History Main Topics  . Smoking status: Former Smoker    Quit date: 01/23/2006  . Smokeless tobacco: Never Used  . Alcohol Use: 0.0 oz/week    0 Standard drinks or equivalent per week     Comment: rare  . Drug Use: No  . Sexual Activity: Not Currently   Other Topics Concern  . Not on file   Social History Narrative   Patient is disabled.    Patient lives with his wife Ames Stanfield.    Patient has 2 children.           Allergies  Allergen Reactions  . Betadine [Povidone Iodine]      Review of Systems  Constitutional: Negative for fever, chills, weight loss, malaise/fatigue and fatigue.  HENT: Negative for ear discharge, ear pain and sore throat.   Eyes: Negative for blurred vision.  Respiratory: Negative for cough, sputum production, shortness of breath and wheezing.   Cardiovascular:  Negative for chest pain, palpitations and leg swelling.  Gastrointestinal: Negative for heartburn, nausea, abdominal pain, diarrhea, constipation, blood in stool and melena.  Genitourinary: Negative for dysuria, urgency, frequency, hematuria and impotence.  Musculoskeletal: Negative for myalgias, back pain, joint pain and neck pain.  Skin: Negative for pallor and rash.  Neurological: Negative for dizziness, tingling, tremors, sensory change, focal weakness, seizures, speech difficulty, weakness and headaches.   Endo/Heme/Allergies: Negative for environmental allergies, polydipsia and polyphagia. Does not bruise/bleed easily.  Psychiatric/Behavioral: Negative for depression, suicidal ideas and confusion. The patient is not nervous/anxious and does not have insomnia.      Objective  Filed Vitals:   01/01/16 0817  BP: 130/80  Pulse: 70  Height: 5\' 10"  (1.778 m)  Weight: 246 lb (111.585 kg)    Physical Exam  Constitutional: He is oriented to person, place, and time and well-developed, well-nourished, and in no distress.  HENT:  Head: Normocephalic.  Right Ear: External ear normal.  Left Ear: External ear normal.  Nose: Nose normal.  Mouth/Throat: Oropharynx is clear and moist.  Eyes: Conjunctivae and EOM are normal. Pupils are equal, round, and reactive to light. Right eye exhibits no discharge. Left eye exhibits no discharge. No scleral icterus.  Neck: Normal range of motion. Neck supple. No JVD present. No tracheal deviation present. No thyromegaly present.  Cardiovascular: Normal rate, regular rhythm, normal heart sounds and intact distal pulses.  Exam reveals no gallop and no friction rub.   No murmur heard. Pulmonary/Chest: Breath sounds normal. No respiratory distress. He has no wheezes. He has no rales.  Abdominal: Soft. Bowel sounds are normal. He exhibits no mass. There is no hepatosplenomegaly. There is no tenderness. There is no rebound, no guarding and no CVA tenderness.  Musculoskeletal: Normal range of motion. He exhibits no edema or tenderness.  Lymphadenopathy:    He has no cervical adenopathy.  Neurological: He is alert and oriented to person, place, and time. He has normal sensation, normal strength and intact cranial nerves. No cranial nerve deficit.  Skin: Skin is warm. No rash noted.  Psychiatric: Mood and affect normal.  Nursing note and vitals reviewed.     Assessment & Plan  Problem List Items Addressed This Visit      Cardiovascular and Mediastinum    Essential hypertension - Primary     Endocrine   Type 2 diabetes mellitus with neurologic complication, without long-term current use of insulin (HCC)   Relevant Orders   Hemoglobin A1c     Nervous and Auditory   Neuropathy associated with endocrine disorder (HCC)   Relevant Medications   pregabalin (LYRICA) 200 MG capsule     Musculoskeletal and Integument   Lumbosacral disc disease   Relevant Orders   DG Lumbar Spine Complete     Other   B12 deficiency anemia   Relevant Medications   cyanocobalamin ((VITAMIN B-12)) injection 1,000 mcg (Completed)        Dr. Sanaai Doane [facility]  01/01/2016

## 2016-01-02 ENCOUNTER — Other Ambulatory Visit: Payer: Self-pay

## 2016-01-02 DIAGNOSIS — E119 Type 2 diabetes mellitus without complications: Secondary | ICD-10-CM

## 2016-01-02 LAB — HEMOGLOBIN A1C
ESTIMATED AVERAGE GLUCOSE: 180 mg/dL
HEMOGLOBIN A1C: 7.9 % — AB (ref 4.8–5.6)

## 2016-01-02 MED ORDER — GLIPIZIDE 5 MG PO TABS: 5.0000 mg | ORAL_TABLET | Freq: Two times a day (BID) | ORAL | Status: DC

## 2016-01-08 ENCOUNTER — Other Ambulatory Visit: Payer: Self-pay | Admitting: Family Medicine

## 2016-01-13 ENCOUNTER — Ambulatory Visit (INDEPENDENT_AMBULATORY_CARE_PROVIDER_SITE_OTHER): Payer: Medicare Other

## 2016-01-13 DIAGNOSIS — E538 Deficiency of other specified B group vitamins: Secondary | ICD-10-CM | POA: Diagnosis not present

## 2016-01-13 MED ORDER — CYANOCOBALAMIN 1000 MCG/ML IJ SOLN
1000.0000 ug | Freq: Once | INTRAMUSCULAR | Status: AC
  Administered 2016-01-13: 1000 ug via INTRAMUSCULAR

## 2016-01-18 ENCOUNTER — Other Ambulatory Visit: Payer: Self-pay | Admitting: Family Medicine

## 2016-01-23 ENCOUNTER — Other Ambulatory Visit: Payer: Self-pay | Admitting: Neurology

## 2016-01-27 ENCOUNTER — Ambulatory Visit (INDEPENDENT_AMBULATORY_CARE_PROVIDER_SITE_OTHER): Payer: Medicare Other

## 2016-01-27 DIAGNOSIS — E538 Deficiency of other specified B group vitamins: Secondary | ICD-10-CM

## 2016-01-27 MED ORDER — CYANOCOBALAMIN 1000 MCG/ML IJ SOLN
1000.0000 ug | Freq: Once | INTRAMUSCULAR | Status: AC
  Administered 2016-01-27: 1000 ug via INTRAMUSCULAR

## 2016-02-08 ENCOUNTER — Other Ambulatory Visit: Payer: Self-pay | Admitting: Family Medicine

## 2016-02-10 ENCOUNTER — Ambulatory Visit (INDEPENDENT_AMBULATORY_CARE_PROVIDER_SITE_OTHER): Payer: Medicare Other

## 2016-02-10 DIAGNOSIS — E119 Type 2 diabetes mellitus without complications: Secondary | ICD-10-CM | POA: Diagnosis not present

## 2016-02-10 DIAGNOSIS — M5441 Lumbago with sciatica, right side: Secondary | ICD-10-CM | POA: Diagnosis not present

## 2016-02-10 DIAGNOSIS — E538 Deficiency of other specified B group vitamins: Secondary | ICD-10-CM

## 2016-02-10 MED ORDER — CYANOCOBALAMIN 1000 MCG/ML IJ SOLN
1000.0000 ug | Freq: Once | INTRAMUSCULAR | Status: AC
  Administered 2016-02-10: 1000 ug via INTRAMUSCULAR

## 2016-02-10 MED ORDER — GLIPIZIDE 5 MG PO TABS: 5.0000 mg | ORAL_TABLET | Freq: Two times a day (BID) | ORAL | Status: DC

## 2016-02-13 DIAGNOSIS — M5441 Lumbago with sciatica, right side: Secondary | ICD-10-CM | POA: Diagnosis not present

## 2016-02-13 DIAGNOSIS — M545 Low back pain: Secondary | ICD-10-CM | POA: Diagnosis not present

## 2016-02-19 DIAGNOSIS — M5441 Lumbago with sciatica, right side: Secondary | ICD-10-CM | POA: Diagnosis not present

## 2016-02-19 DIAGNOSIS — M6281 Muscle weakness (generalized): Secondary | ICD-10-CM | POA: Diagnosis not present

## 2016-02-24 ENCOUNTER — Ambulatory Visit (INDEPENDENT_AMBULATORY_CARE_PROVIDER_SITE_OTHER): Payer: Medicare Other

## 2016-02-24 DIAGNOSIS — M6281 Muscle weakness (generalized): Secondary | ICD-10-CM | POA: Diagnosis not present

## 2016-02-24 DIAGNOSIS — E538 Deficiency of other specified B group vitamins: Secondary | ICD-10-CM

## 2016-02-24 DIAGNOSIS — M5441 Lumbago with sciatica, right side: Secondary | ICD-10-CM | POA: Diagnosis not present

## 2016-02-24 MED ORDER — CYANOCOBALAMIN 1000 MCG/ML IJ SOLN
1000.0000 ug | Freq: Once | INTRAMUSCULAR | Status: AC
Start: 2016-02-24 — End: 2016-02-24
  Administered 2016-02-24: 1000 ug via INTRAMUSCULAR

## 2016-02-25 ENCOUNTER — Telehealth: Payer: Self-pay | Admitting: *Deleted

## 2016-02-25 NOTE — Telephone Encounter (Signed)
Patient [employer] form Jeremy [name].

## 2016-02-25 NOTE — Telephone Encounter (Signed)
Patient Jeremy [name] form on [employer].

## 2016-02-26 DIAGNOSIS — M5441 Lumbago with sciatica, right side: Secondary | ICD-10-CM | POA: Diagnosis not present

## 2016-02-26 DIAGNOSIS — M6281 Muscle weakness (generalized): Secondary | ICD-10-CM | POA: Diagnosis not present

## 2016-02-26 NOTE — Telephone Encounter (Signed)
Dr. Krista Blue last saw patient on 09/03/14.  He has a pending follow up on 03/10/16 and she will address his paperwork with him at his appt.

## 2016-03-02 DIAGNOSIS — M6281 Muscle weakness (generalized): Secondary | ICD-10-CM | POA: Diagnosis not present

## 2016-03-02 DIAGNOSIS — M5441 Lumbago with sciatica, right side: Secondary | ICD-10-CM | POA: Diagnosis not present

## 2016-03-03 ENCOUNTER — Other Ambulatory Visit: Payer: Self-pay

## 2016-03-03 ENCOUNTER — Telehealth: Payer: Self-pay | Admitting: Neurology

## 2016-03-03 NOTE — Telephone Encounter (Signed)
Dr. Krista Blue will address his disability paperwork at his 03/10/16 appointment.

## 2016-03-03 NOTE — Telephone Encounter (Addendum)
[employer] is calling and needs medical records for Case [id number] and incident [id number].  Thanks!

## 2016-03-03 NOTE — Telephone Encounter (Signed)
Paperwork filed and will be addressed by Dr. Krista Blue at his appointment on 03/10/16.

## 2016-03-03 NOTE — Telephone Encounter (Signed)
[employer] disability form placed on Jeremy [name]'s desk dg

## 2016-03-04 DIAGNOSIS — M6281 Muscle weakness (generalized): Secondary | ICD-10-CM | POA: Diagnosis not present

## 2016-03-04 DIAGNOSIS — M5441 Lumbago with sciatica, right side: Secondary | ICD-10-CM | POA: Diagnosis not present

## 2016-03-05 DIAGNOSIS — M5136 Other intervertebral disc degeneration, lumbar region: Secondary | ICD-10-CM | POA: Diagnosis not present

## 2016-03-05 DIAGNOSIS — M545 Low back pain: Secondary | ICD-10-CM | POA: Diagnosis not present

## 2016-03-09 ENCOUNTER — Ambulatory Visit (INDEPENDENT_AMBULATORY_CARE_PROVIDER_SITE_OTHER): Payer: Medicare Other

## 2016-03-09 DIAGNOSIS — D519 Vitamin B12 deficiency anemia, unspecified: Secondary | ICD-10-CM | POA: Diagnosis not present

## 2016-03-09 DIAGNOSIS — H40003 Preglaucoma, unspecified, bilateral: Secondary | ICD-10-CM | POA: Diagnosis not present

## 2016-03-09 MED ORDER — CYANOCOBALAMIN 1000 MCG/ML IJ SOLN
1000.0000 ug | Freq: Once | INTRAMUSCULAR | Status: AC
  Administered 2016-03-09: 1000 ug via INTRAMUSCULAR

## 2016-03-10 ENCOUNTER — Encounter: Payer: Self-pay | Admitting: Neurology

## 2016-03-10 ENCOUNTER — Ambulatory Visit (INDEPENDENT_AMBULATORY_CARE_PROVIDER_SITE_OTHER): Payer: Medicare Other | Admitting: Neurology

## 2016-03-10 VITALS — BP 126/67 | HR 58 | Ht 70.0 in | Wt 250.0 lb

## 2016-03-10 DIAGNOSIS — M519 Unspecified thoracic, thoracolumbar and lumbosacral intervertebral disc disorder: Secondary | ICD-10-CM

## 2016-03-10 DIAGNOSIS — M5441 Lumbago with sciatica, right side: Secondary | ICD-10-CM | POA: Diagnosis not present

## 2016-03-10 DIAGNOSIS — M545 Low back pain, unspecified: Secondary | ICD-10-CM | POA: Insufficient documentation

## 2016-03-10 DIAGNOSIS — G35 Multiple sclerosis: Secondary | ICD-10-CM | POA: Diagnosis not present

## 2016-03-10 DIAGNOSIS — G35D Multiple sclerosis, unspecified: Secondary | ICD-10-CM

## 2016-03-10 DIAGNOSIS — M4647 Discitis, unspecified, lumbosacral region: Secondary | ICD-10-CM | POA: Diagnosis not present

## 2016-03-10 NOTE — Telephone Encounter (Signed)
Paperwork completed by Dr. Krista Blue during his office visit and faxed to [employer] (ph: [phone], fax: [phone]).

## 2016-03-10 NOTE — Progress Notes (Signed)
Chief Complaint  Patient presents with  . Multiple Sclerosis    He is here with his wife, [name].  No new concerns with his MS.  He is still using Betaseron for treatemtnt.  He has to use a cane to assist with ambulation.     [facility]  PATIENT: Jeremy [name] DOB: 1951/02/06   REASON FOR VISIT: Follow-up for multiple sclerosis, gait abnormality HISTORY FROM: Patient    HISTORY OF PRESENT ILLNESS: HISTORY:He has PMHx of diabetes, hypertension and multiple sclerosis. The diagnosis of multiple sclerosis was confirmed by abnormal MRI brain and cervical, and a spinal fluid testing. He was initially followed by Dr. Estella Husk in 2008. Last seen in this office Oct 2014, He continues to have gait difficulties and relies on a cane for ambulation. He has had no problems with his vision or bowel/bladder control.  He has had occasional falls.He has ongoing pain in his legs. Last MS flare was over 2 years ago. He is currently on Betaseron every other day, treatment started in 2007.   MRI cervical in 2013 with findings consistent with MS plaques and no enhancement,mild spinal stenosis.  MRI of the brain with prominent white matter changes consistent with MS. No active areas of demyelination. He continues to have problems with low   The initial symptom was acute onset of left leg more than arm weakness, gait difficulty in 2006. This eventually led to the diagnosis of relapsing remitting multiple sclerosis. He has been receiving Betaseron treatment since early 2007. He has tolerated the medication well. He has had no problems with lipoatrophy or skin necrosis. In September 2009, he developed GI bleeding. Workup had demonstrated colon inflammation and obstruction.He underwent colectomy and recovered well from that procedure.  He continues to have gait difficulties and relies on a cane for ambulation. He has had no problems with his vision or bowel/bladder control. He does have  chronic extremity pain, which has been managed with Lyrica and Tramadol.   He uses an Transport planner for situations in which he would need to walk long distances. He has had occasional falls.He has ongoing pain in his legs  UPDATE 07/2014: He was admitted to hospital in Nov 2014 for anemia, low blood count, was evaluated by hematologist in [facility], now has recovered, he still has moderate gait difficulty.also complains bilateral lower extremity spasticity, is taking Lyrica, there was no clinical flareups, last MRI was in 2013, there was significant lesions in brain, and cervical spine  UPDATE 09/03/2014: He has tried Baclofen 9m qhs, complains of excessive sleepiness, could not tolerate it, he still mows the yard, the most limitation is his gait difficulty,he denies bowel and bladder incontinence.  We have reviewed MRI togather, MRI scan of brain showing periventricular and subcortical white matter hyperintensities consistent with multiple sclerosis. Presence of atrophy of corpus callosum indicates chronic disease. No significant change compared with MRI 06/02/2012   MRI cervical spine showing mild spondylitic changes descibed above most prominent at C 3-4 where there is moderate left formaminal narrowing. Ill defined spinal cord hypertintensities at C 3-4 to c 6-7 likely represent chronic multiple sclerosis inactive plaques. No significant change compared with MRI C spine 06/02/2012  UPDATE 11//17/16 Jeremy [name] 65year old male returns for follow-up. He was last seen in this office 03/06/15.  He was diagnosed with multiple sclerosis in 2006. He is currently on Betaseron every other day without injection site issues.. He still remains fairly active mowing the yard, denies any bowel or bladder incontinence. He  denies any sensory changes, double vision speech or swallowing difficulty. He ambulates with a cane. He has had a couple of falls since last seen. He returns for reevaluation  UPDATE May 17th  2017: He is with his wife at today's clinical visit, he continued to receive Betaseron every other day without significant side effect, he has significant gait difficulty, left side is weaker, complains of chronic fatigue, chronic low back pain, bilateral lower extremity neuropathic pain, taking Lyrica, tramadol as needed,  Reviewed laboratory evaluation, A1c 7.9, CMP showed elevated creatinine 1.4, normal CBC  We again personally reviewed MRI of the scan without contrast, most recent was in 2015, MRI of the brain showed multiple supratentorium lesions, no contrast enhancement, MRI of cervical spine showed ill-defined C3-C7 cord lesion, no contrast enhancement, no significant change compared to previous scans  REVIEW OF SYSTEMS: Full 14 system review of systems performed and notable only for those listed, all others are neg:  Fatigue, heat intolerance, bruise, numbness, weakness, walking difficulty, leg swelling  ALLERGIES: Allergies  Allergen Reactions  . Betadine [Povidone Iodine]     HOME MEDICATIONS: Outpatient Prescriptions Prior to Visit  Medication Sig Dispense Refill  . aspirin 81 MG tablet Take 81 mg by mouth daily.    . Cyanocobalamin (VITAMIN B-12 IJ) Inject as directed. Every other week.    . docusate sodium (COLACE) 50 MG capsule Take 50 mg by mouth 2 (two) times daily. otc    . Ferrous Sulfate (IRON SUPPLEMENT PO) Take by mouth 3 (three) times daily.    . furosemide (LASIX) 40 MG tablet TAKE 1 TABLET(40 MG) BY MOUTH TWICE DAILY 180 tablet 0  . gemfibrozil (LOPID) 600 MG tablet Take 1 tablet (600 mg total) by mouth 2 (two) times daily before a meal. 180 tablet 1  . glipiZIDE (GLUCOTROL) 5 MG tablet Take 1 tablet (5 mg total) by mouth 2 (two) times daily before a meal. 180 tablet 0  . Interferon Beta-1b (BETASERON) 0.3 MG KIT injection Inject subcutaneously 1  syringe every other day 42 each 6  . metoprolol (LOPRESSOR) 50 MG tablet Take 0.5 tablets (25 mg total) by mouth daily.  45 tablet 1  . metoprolol (LOPRESSOR) 50 MG tablet TAKE 1/2 TABLET(25 MG) BY MOUTH DAILY 45 tablet 0  . mupirocin ointment (BACTROBAN) 2 % USE AS DIRECTED 22 g 0  . omeprazole (PRILOSEC) 40 MG capsule Take 1 capsule (40 mg total) by mouth daily. 90 capsule 1  . pregabalin (LYRICA) 200 MG capsule Take 1 capsule (200 mg total) by mouth 2 (two) times daily. 60 capsule 6  . sertraline (ZOLOFT) 50 MG tablet Take 0.5 tablets (25 mg total) by mouth daily. 45 tablet 1  . simvastatin (ZOCOR) 40 MG tablet TAKE 1 TABLET(40 MG) BY MOUTH DAILY 90 tablet 0  . traMADol (ULTRAM) 50 MG tablet TAKE 1 TABLET BY MOUTH EVERY 6 HOURS AS NEEDED FOR PAIN 120 tablet 5  . furosemide (LASIX) 40 MG tablet Take 1 tablet (40 mg total) by mouth daily. 90 tablet 0   No facility-administered medications prior to visit.    PAST MEDICAL HISTORY: Past Medical History  Diagnosis Date  . Depression   . Diabetes ([place])   . Hyperlipemia   . Hypertension   . Chronic pain   . MS (multiple sclerosis) ([place])   . GERD (gastroesophageal reflux disease)     PAST SURGICAL HISTORY: Past Surgical History  Procedure Laterality Date  . Colectomy  06-2008  . Colonoscopy  2015  normal    FAMILY HISTORY: Family History  Problem Relation Age of Onset  . Cancer Mother   . Heart attack Father     SOCIAL HISTORY: Social History   Social History  . Marital Status: Married    Spouse Name: [name]  . Number of Children: 2  . Years of Education: GED   Occupational History  .      Disabled   Social History Main Topics  . Smoking status: Former Smoker    Quit date: 01/23/2006  . Smokeless tobacco: Never Used  . Alcohol Use: 0.0 oz/week    0 Standard drinks or equivalent per week     Comment: rare  . Drug Use: No  . Sexual Activity: Not Currently   Other Topics Concern  . Not on file   Social History Narrative   Patient is disabled.    Patient lives with his wife Juan Kissoon.    Patient has 2 children.             PHYSICAL EXAM  Filed Vitals:   03/10/16 1351  BP: 126/67  Pulse: 58  Height: 5' 10"  (1.778 m)  Weight: 250 lb (113.399 kg)   Body mass index is 35.87 kg/(m^2). Generalized: Well developed, obese male in no acute distress  Head: normocephalic and atraumatic, Oropharynx benign  Neck: Supple, no carotid bruits  Cardiac: Regular rate rhythm, no murmur  Musculoskeletal: Mild left hemiparesis Neurological examination   Mentation: Alert oriented to time, place, history taking. Follows all commands speech and language fluent  Cranial nerve II-XII: Pupils were equal round reactive to light extraocular movements were full, visual field were full on confrontational test. Facial sensation and strength were normal. hearing was intact to finger rubbing bilaterally. Uvula tongue midline. head turning and shoulder shrug and were normal and symmetric.Tongue protrusion into cheek strength was normal. Motor: Mild left-sided weakness, left upper extremity fixation on rapid rotating movement,  Sensory: skin discoloration of distal leg, length dependent sensory changes. Coordination: finger-nose-finger, heel-to-shin bilaterally, no dysmetria Reflexes: Hyperreflexia of both lower extremity Gait and Station: Need to push up to get up from seated position wide based, stiff, unsteady left hemi-circumferential gait  DIAGNOSTIC DATA (LABS, IMAGING, TESTING) -    Component Value Date/Time   NA 144 09/11/2015 1440   NA 143 08/31/2013 0417   K 4.1 09/11/2015 1440   K 3.5 08/31/2013 0417   CL 100 09/11/2015 1440   CL 109* 08/31/2013 0417   CO2 23 09/11/2015 1440   CO2 28 08/31/2013 0417   GLUCOSE 90 09/11/2015 1440   GLUCOSE 133* 08/31/2013 0417   BUN 22 09/11/2015 1440   BUN 39* 08/31/2013 0417   CREATININE 1.40* 09/11/2015 1440   CREATININE 1.53* 08/31/2013 0417   CALCIUM 9.3 09/11/2015 1440   CALCIUM 8.9 08/31/2013 0417   PROT 7.3 09/11/2015 1440   PROT 6.2* 08/31/2013 0417    ALBUMIN 4.1 09/11/2015 1440   ALBUMIN 2.9* 08/31/2013 0417   AST 24 09/11/2015 1440   AST 35 08/31/2013 0417   ALT 11 09/11/2015 1440   ALT 15 08/31/2013 0417   ALKPHOS 80 09/11/2015 1440   ALKPHOS 60 08/31/2013 0417   BILITOT 0.3 09/11/2015 1440   BILITOT 0.3 07/30/2014 1504   BILITOT 0.6 08/31/2013 0417   GFRNONAA 53* 09/11/2015 1440   GFRNONAA 48* 08/31/2013 0417   GFRAA 61 09/11/2015 1440   GFRAA 56* 08/31/2013 0417   Lab Results  Component Value Date   CHOL 148 06/03/2015  HDL 35* 06/03/2015   LDLCALC 90 06/03/2015   TRIG 117 06/03/2015   CHOLHDL 4.2 06/03/2015   Lab Results  Component Value Date   HGBA1C 7.9* 01/01/2016    ASSESSMENT AND PLAN  65 y.o. year old male  Relapsing remediating multiple sclerosis Significant gait abnormality Chronic low back pain Chronic fatigue  Continue Betaseron injection every other day  Lyrica 200 mg twice a day, tramadol as needed for chronic pain,  Fill form for his long-term disability   Marcial Pacas, M.D. Ph.D.  [facility] [place], [place] [address] Phone: [phone] Fax:      [phone]

## 2016-03-11 ENCOUNTER — Other Ambulatory Visit: Payer: Self-pay

## 2016-03-22 ENCOUNTER — Other Ambulatory Visit: Payer: Self-pay | Admitting: Family Medicine

## 2016-03-23 ENCOUNTER — Ambulatory Visit (INDEPENDENT_AMBULATORY_CARE_PROVIDER_SITE_OTHER): Payer: Medicare Other

## 2016-03-23 DIAGNOSIS — E538 Deficiency of other specified B group vitamins: Secondary | ICD-10-CM | POA: Diagnosis not present

## 2016-03-23 MED ORDER — CYANOCOBALAMIN 1000 MCG/ML IJ SOLN
1000.0000 ug | Freq: Once | INTRAMUSCULAR | Status: AC
  Administered 2016-03-23: 1000 ug via INTRAMUSCULAR

## 2016-04-06 ENCOUNTER — Ambulatory Visit (INDEPENDENT_AMBULATORY_CARE_PROVIDER_SITE_OTHER): Payer: Medicare Other

## 2016-04-06 DIAGNOSIS — E538 Deficiency of other specified B group vitamins: Secondary | ICD-10-CM | POA: Diagnosis not present

## 2016-04-06 MED ORDER — CYANOCOBALAMIN 1000 MCG/ML IJ SOLN
1000.0000 ug | Freq: Once | INTRAMUSCULAR | Status: AC
  Administered 2016-04-06: 1000 ug via INTRAMUSCULAR

## 2016-04-16 ENCOUNTER — Other Ambulatory Visit: Payer: Self-pay | Admitting: Family Medicine

## 2016-04-17 ENCOUNTER — Other Ambulatory Visit: Payer: Self-pay | Admitting: Family Medicine

## 2016-04-18 ENCOUNTER — Other Ambulatory Visit: Payer: Self-pay | Admitting: Family Medicine

## 2016-04-20 ENCOUNTER — Ambulatory Visit (INDEPENDENT_AMBULATORY_CARE_PROVIDER_SITE_OTHER): Payer: Medicare Other

## 2016-04-20 DIAGNOSIS — E538 Deficiency of other specified B group vitamins: Secondary | ICD-10-CM | POA: Diagnosis not present

## 2016-04-20 MED ORDER — CYANOCOBALAMIN 1000 MCG/ML IJ SOLN
1000.0000 ug | Freq: Once | INTRAMUSCULAR | Status: AC
  Administered 2016-04-20: 1000 ug via INTRAMUSCULAR

## 2016-05-03 DIAGNOSIS — E1129 Type 2 diabetes mellitus with other diabetic kidney complication: Secondary | ICD-10-CM | POA: Diagnosis not present

## 2016-05-03 DIAGNOSIS — R609 Edema, unspecified: Secondary | ICD-10-CM | POA: Diagnosis not present

## 2016-05-03 DIAGNOSIS — N183 Chronic kidney disease, stage 3 (moderate): Secondary | ICD-10-CM | POA: Diagnosis not present

## 2016-05-04 ENCOUNTER — Ambulatory Visit (INDEPENDENT_AMBULATORY_CARE_PROVIDER_SITE_OTHER): Payer: Medicare Other

## 2016-05-04 DIAGNOSIS — D519 Vitamin B12 deficiency anemia, unspecified: Secondary | ICD-10-CM

## 2016-05-04 MED ORDER — CYANOCOBALAMIN 1000 MCG/ML IJ SOLN
1000.0000 ug | Freq: Once | INTRAMUSCULAR | Status: AC
  Administered 2016-05-04: 1000 ug via INTRAMUSCULAR

## 2016-05-13 DIAGNOSIS — I270 Primary pulmonary hypertension: Secondary | ICD-10-CM | POA: Diagnosis not present

## 2016-05-13 DIAGNOSIS — N183 Chronic kidney disease, stage 3 (moderate): Secondary | ICD-10-CM | POA: Diagnosis not present

## 2016-05-13 DIAGNOSIS — R609 Edema, unspecified: Secondary | ICD-10-CM | POA: Diagnosis not present

## 2016-05-13 DIAGNOSIS — E1129 Type 2 diabetes mellitus with other diabetic kidney complication: Secondary | ICD-10-CM | POA: Diagnosis not present

## 2016-05-18 ENCOUNTER — Ambulatory Visit (INDEPENDENT_AMBULATORY_CARE_PROVIDER_SITE_OTHER): Payer: Medicare Other

## 2016-05-18 DIAGNOSIS — D519 Vitamin B12 deficiency anemia, unspecified: Secondary | ICD-10-CM

## 2016-05-18 MED ORDER — CYANOCOBALAMIN 1000 MCG/ML IJ SOLN
1000.0000 ug | Freq: Once | INTRAMUSCULAR | Status: AC
  Administered 2016-05-18: 1000 ug via INTRAMUSCULAR

## 2016-06-01 ENCOUNTER — Ambulatory Visit (INDEPENDENT_AMBULATORY_CARE_PROVIDER_SITE_OTHER): Payer: Medicare Other

## 2016-06-01 DIAGNOSIS — D519 Vitamin B12 deficiency anemia, unspecified: Secondary | ICD-10-CM | POA: Diagnosis not present

## 2016-06-01 MED ORDER — CYANOCOBALAMIN 1000 MCG/ML IJ SOLN
1000.0000 ug | Freq: Once | INTRAMUSCULAR | Status: AC
  Administered 2016-06-01: 1000 ug via INTRAMUSCULAR

## 2016-06-01 MED ORDER — MUPIROCIN 2 % EX OINT: TOPICAL_OINTMENT | CUTANEOUS | 0 refills | Status: DC

## 2016-06-07 ENCOUNTER — Other Ambulatory Visit: Payer: Self-pay | Admitting: Family Medicine

## 2016-06-07 DIAGNOSIS — K219 Gastro-esophageal reflux disease without esophagitis: Secondary | ICD-10-CM

## 2016-06-15 ENCOUNTER — Ambulatory Visit (INDEPENDENT_AMBULATORY_CARE_PROVIDER_SITE_OTHER): Payer: Medicare Other

## 2016-06-15 DIAGNOSIS — D519 Vitamin B12 deficiency anemia, unspecified: Secondary | ICD-10-CM

## 2016-06-15 MED ORDER — CYANOCOBALAMIN 1000 MCG/ML IJ SOLN
1000.0000 ug | Freq: Once | INTRAMUSCULAR | Status: AC
  Administered 2016-06-15: 1000 ug via INTRAMUSCULAR

## 2016-06-22 ENCOUNTER — Other Ambulatory Visit: Payer: Self-pay | Admitting: Family Medicine

## 2016-06-22 ENCOUNTER — Other Ambulatory Visit: Payer: Self-pay | Admitting: Neurology

## 2016-06-22 DIAGNOSIS — G63 Polyneuropathy in diseases classified elsewhere: Principal | ICD-10-CM

## 2016-06-22 DIAGNOSIS — E349 Endocrine disorder, unspecified: Secondary | ICD-10-CM

## 2016-06-29 ENCOUNTER — Ambulatory Visit (INDEPENDENT_AMBULATORY_CARE_PROVIDER_SITE_OTHER): Payer: Medicare Other

## 2016-06-29 DIAGNOSIS — D519 Vitamin B12 deficiency anemia, unspecified: Secondary | ICD-10-CM

## 2016-06-29 MED ORDER — CYANOCOBALAMIN 1000 MCG/ML IJ SOLN
1000.0000 ug | Freq: Once | INTRAMUSCULAR | Status: AC
  Administered 2016-06-29: 1000 ug via INTRAMUSCULAR

## 2016-07-04 ENCOUNTER — Other Ambulatory Visit: Payer: Self-pay | Admitting: Family Medicine

## 2016-07-13 ENCOUNTER — Ambulatory Visit (INDEPENDENT_AMBULATORY_CARE_PROVIDER_SITE_OTHER): Payer: Medicare Other

## 2016-07-13 DIAGNOSIS — D519 Vitamin B12 deficiency anemia, unspecified: Secondary | ICD-10-CM | POA: Diagnosis not present

## 2016-07-13 MED ORDER — CYANOCOBALAMIN 1000 MCG/ML IJ SOLN
1000.0000 ug | Freq: Once | INTRAMUSCULAR | Status: AC
  Administered 2016-07-13: 1000 ug via INTRAMUSCULAR

## 2016-07-14 ENCOUNTER — Other Ambulatory Visit: Payer: Self-pay | Admitting: Family Medicine

## 2016-07-14 DIAGNOSIS — E119 Type 2 diabetes mellitus without complications: Secondary | ICD-10-CM

## 2016-07-27 ENCOUNTER — Ambulatory Visit (INDEPENDENT_AMBULATORY_CARE_PROVIDER_SITE_OTHER): Payer: Medicare Other

## 2016-07-27 DIAGNOSIS — E538 Deficiency of other specified B group vitamins: Secondary | ICD-10-CM | POA: Diagnosis not present

## 2016-07-27 MED ORDER — CYANOCOBALAMIN 1000 MCG/ML IJ SOLN
1000.0000 ug | Freq: Once | INTRAMUSCULAR | Status: AC
  Administered 2016-07-27: 1000 ug via INTRAMUSCULAR

## 2016-08-01 ENCOUNTER — Other Ambulatory Visit: Payer: Self-pay | Admitting: Family Medicine

## 2016-08-01 DIAGNOSIS — G63 Polyneuropathy in diseases classified elsewhere: Principal | ICD-10-CM

## 2016-08-01 DIAGNOSIS — E349 Endocrine disorder, unspecified: Secondary | ICD-10-CM

## 2016-08-02 ENCOUNTER — Other Ambulatory Visit: Payer: Self-pay

## 2016-08-06 ENCOUNTER — Encounter: Payer: Self-pay | Admitting: Family Medicine

## 2016-08-06 ENCOUNTER — Ambulatory Visit (INDEPENDENT_AMBULATORY_CARE_PROVIDER_SITE_OTHER): Payer: Medicare Other | Admitting: Family Medicine

## 2016-08-06 VITALS — BP 130/64 | HR 64 | Ht 70.0 in | Wt 241.0 lb

## 2016-08-06 DIAGNOSIS — K219 Gastro-esophageal reflux disease without esophagitis: Secondary | ICD-10-CM

## 2016-08-06 DIAGNOSIS — D519 Vitamin B12 deficiency anemia, unspecified: Secondary | ICD-10-CM

## 2016-08-06 DIAGNOSIS — E1142 Type 2 diabetes mellitus with diabetic polyneuropathy: Secondary | ICD-10-CM | POA: Diagnosis not present

## 2016-08-06 DIAGNOSIS — E782 Mixed hyperlipidemia: Secondary | ICD-10-CM

## 2016-08-06 DIAGNOSIS — E349 Endocrine disorder, unspecified: Secondary | ICD-10-CM

## 2016-08-06 DIAGNOSIS — G63 Polyneuropathy in diseases classified elsewhere: Secondary | ICD-10-CM | POA: Diagnosis not present

## 2016-08-06 DIAGNOSIS — I10 Essential (primary) hypertension: Secondary | ICD-10-CM | POA: Diagnosis not present

## 2016-08-06 DIAGNOSIS — F330 Major depressive disorder, recurrent, mild: Secondary | ICD-10-CM

## 2016-08-06 DIAGNOSIS — Z23 Encounter for immunization: Secondary | ICD-10-CM

## 2016-08-06 DIAGNOSIS — F331 Major depressive disorder, recurrent, moderate: Secondary | ICD-10-CM | POA: Diagnosis not present

## 2016-08-06 MED ORDER — FUROSEMIDE 40 MG PO TABS: 40.0000 mg | ORAL_TABLET | Freq: Two times a day (BID) | ORAL | 1 refills | Status: DC

## 2016-08-06 MED ORDER — METOPROLOL TARTRATE 50 MG PO TABS: 25.0000 mg | ORAL_TABLET | Freq: Every day | ORAL | 1 refills | Status: DC

## 2016-08-06 MED ORDER — SIMVASTATIN 40 MG PO TABS: ORAL_TABLET | ORAL | 1 refills | Status: DC

## 2016-08-06 MED ORDER — PREGABALIN 200 MG PO CAPS: 200.0000 mg | ORAL_CAPSULE | Freq: Two times a day (BID) | ORAL | 5 refills | Status: DC

## 2016-08-06 MED ORDER — SERTRALINE HCL 50 MG PO TABS: 25.0000 mg | ORAL_TABLET | Freq: Every day | ORAL | 1 refills | Status: DC

## 2016-08-06 MED ORDER — OMEPRAZOLE 40 MG PO CPDR: DELAYED_RELEASE_CAPSULE | ORAL | 1 refills | Status: DC

## 2016-08-06 MED ORDER — GEMFIBROZIL 600 MG PO TABS: 600.0000 mg | ORAL_TABLET | Freq: Two times a day (BID) | ORAL | 1 refills | Status: DC

## 2016-08-06 MED ORDER — GLIPIZIDE 5 MG PO TABS: ORAL_TABLET | ORAL | 0 refills | Status: DC

## 2016-08-06 NOTE — Progress Notes (Signed)
Name: Jeremy [name]   MRN: [id number]    DOB: 02/12/1951   Date:08/06/2016       Progress Note  Subjective  Chief Complaint  Chief Complaint  Patient presents with  . Gastroesophageal Reflux  . Hypertension  . Hyperlipidemia  . Diabetes  . Depression  . Anemia  . Peripheral Neuropathy    takes Lyrica    Gastroesophageal Reflux  He reports no abdominal pain, no belching, no chest pain, no choking, no coughing, no dysphagia, no heartburn, no hoarse voice, no nausea, no sore throat, no water brash or no wheezing. This is a chronic problem. The current episode started more than 1 year ago. The problem occurs occasionally. The problem has been waxing and waning. The symptoms are aggravated by certain foods. Associated symptoms include anemia and weight loss. Pertinent negatives include no fatigue, melena or muscle weakness. Risk factors include obesity. He has tried a PPI for the symptoms. The treatment provided moderate relief.  Hypertension  This is a chronic problem. The current episode started more than 1 year ago. The problem is unchanged. The problem is controlled. Associated symptoms include blurred vision. Pertinent negatives include no anxiety, chest pain, headaches, malaise/fatigue, neck pain, orthopnea, palpitations, peripheral edema, shortness of breath or sweats. There are no associated agents to hypertension. There are no known risk factors for coronary artery disease. Past treatments include beta blockers and diuretics. The current treatment provides mild improvement. There are no compliance problems.  There is no history of angina, kidney disease, CAD/MI, CVA, heart failure, left ventricular hypertrophy, PVD, renovascular disease or retinopathy. There is no history of chronic renal disease or a hypertension causing med.  Hyperlipidemia  This is a chronic problem. The current episode started more than 1 year ago. The problem is controlled. Exacerbating diseases include diabetes. He  has no history of chronic renal disease or hypothyroidism. Factors aggravating his hyperlipidemia include beta blockers. Pertinent negatives include no chest pain, focal sensory loss, focal weakness, leg pain, myalgias or shortness of breath. Current antihyperlipidemic treatment includes fibric acid derivatives and statins. The current treatment provides moderate improvement of lipids. There are no compliance problems.   Diabetes  He presents for his follow-up diabetic visit. He has type 2 diabetes mellitus. His disease course has been stable. Pertinent negatives for hypoglycemia include no confusion, dizziness, headaches, hunger, mood changes, nervousness/anxiousness, pallor, seizures, sleepiness, speech difficulty, sweats or tremors. Associated symptoms include blurred vision, foot paresthesias, polyuria and weight loss. Pertinent negatives for diabetes include no chest pain, no fatigue, no foot ulcerations, no polydipsia, no polyphagia, no visual change and no weakness. (Nocturia) There are no hypoglycemic complications. Symptoms are worsening. Pertinent negatives for diabetic complications include no CVA, PVD or retinopathy. His weight is decreasing steadily. He is following a generally healthy diet. His breakfast blood glucose is taken between 8-9 am. His breakfast blood glucose range is generally 180-200 mg/dl. He does not see a podiatrist.Eye exam is current.  Depression       The patient presents with depression.  This is a chronic problem.  The current episode started more than 1 year ago.   The problem occurs intermittently.  The problem has been gradually improving since onset.  Associated symptoms include no decreased concentration, no fatigue, no helplessness, no hopelessness, does not have insomnia, not irritable, no restlessness, no decreased interest, no appetite change, no body aches, no myalgias, no headaches, no indigestion, not sad and no suicidal ideas.  Past treatments include SSRIs -  Selective serotonin  reuptake inhibitors.  Compliance with treatment is good.  Previous treatment provided moderate relief.  Past medical history includes chronic illness, physical disability and depression.     Pertinent negatives include no chronic fatigue syndrome, no hypothyroidism and no anxiety. Anemia  Presents for follow-up visit. Symptoms include weight loss. There has been no abdominal pain, anorexia, bruising/bleeding easily, confusion, fever, leg swelling, malaise/fatigue, pallor, palpitations or paresthesias. Signs of blood loss that are not present include melena. There is no history of chronic renal disease, heart failure or hypothyroidism. There are no compliance problems.     No problem-specific Assessment & Plan notes found for this encounter.   Past Medical History:  Diagnosis Date  . Chronic pain   . Depression   . Diabetes ([place])   . GERD (gastroesophageal reflux disease)   . Hyperlipemia   . Hypertension   . MS (multiple sclerosis) ([place])     Past Surgical History:  Procedure Laterality Date  . COLECTOMY  06-2008  . COLONOSCOPY  2015   normal    Family History  Problem Relation Age of Onset  . Cancer Mother   . Heart attack Father     Social History   Social History  . Marital status: Married    Spouse name: Jeremy [name]  . Number of children: 2  . Years of education: GED   Occupational History  .      Disabled   Social History Main Topics  . Smoking status: Former Smoker    Quit date: 01/23/2006  . Smokeless tobacco: Never Used  . Alcohol use 0.0 oz/week     Comment: rare  . Drug use: No  . Sexual activity: Not Currently   Other Topics Concern  . Not on file   Social History Narrative   Patient is disabled.    Patient lives with his wife Jeremy [name].    Patient has 2 children.           Allergies  Allergen Reactions  . Betadine [Povidone Iodine]      Review of Systems  Constitutional: Positive for weight loss. Negative for appetite  change, chills, fatigue, fever and malaise/fatigue.  HENT: Negative for ear discharge, ear pain, hoarse voice and sore throat.   Eyes: Positive for blurred vision.  Respiratory: Negative for cough, sputum production, choking, shortness of breath and wheezing.   Cardiovascular: Negative for chest pain, palpitations, orthopnea and leg swelling.  Gastrointestinal: Negative for abdominal pain, anorexia, blood in stool, constipation, diarrhea, dysphagia, heartburn, melena and nausea.  Genitourinary: Negative for dysuria, frequency, hematuria and urgency.  Musculoskeletal: Negative for back pain, joint pain, myalgias, muscle weakness and neck pain.  Skin: Negative for pallor and rash.  Neurological: Negative for dizziness, tingling, tremors, sensory change, focal weakness, seizures, speech difficulty, weakness, headaches and paresthesias.  Endo/Heme/Allergies: Negative for environmental allergies, polydipsia and polyphagia. Does not bruise/bleed easily.  Psychiatric/Behavioral: Positive for depression. Negative for confusion, decreased concentration and suicidal ideas. The patient is not nervous/anxious and does not have insomnia.      Objective  Vitals:   08/06/16 0801  BP: 130/64  [name]: 65  Weight: 241 lb (109.3 kg)  Height: 5\' 10"  (1.778 m)    Physical Exam  Constitutional: He is oriented to person, place, and time and well-developed, well-nourished, and in no distress. He is not irritable.  HENT:  Head: Normocephalic.  Right Ear: External ear normal.  Left Ear: External ear normal.  Nose: Nose normal.  Mouth/Throat: Oropharynx is clear and moist.  Eyes: Conjunctivae and EOM are normal. Pupils are equal, round, and reactive to light. Right eye exhibits no discharge. Left eye exhibits no discharge. No scleral icterus.  Neck: Normal range of motion. Neck supple. No JVD present. No tracheal deviation present. No thyromegaly present.  Cardiovascular: Normal rate, regular rhythm, S1  normal, S2 normal, normal heart sounds and intact distal pulses.  PMI is not displaced.  Exam reveals no gallop, no S3, no S4 and no friction rub.   No murmur heard. Pulmonary/Chest: Breath sounds normal. No respiratory distress. He has no wheezes. He has no rales.  Abdominal: Soft. Bowel sounds are normal. He exhibits no mass. There is no hepatosplenomegaly. There is no tenderness. There is no rebound, no guarding and no CVA tenderness.  Musculoskeletal: Normal range of motion. He exhibits no edema or tenderness.  Lymphadenopathy:    He has no cervical adenopathy.  Neurological: He is alert and oriented to person, place, and time. He has normal sensation, normal strength, normal reflexes and intact cranial nerves. No cranial nerve deficit.  Skin: Skin is warm. No rash noted.  Psychiatric: Mood and affect normal.  Nursing note and vitals reviewed.     Assessment & Plan  Problem List Items Addressed This Visit      Cardiovascular and Mediastinum   Essential hypertension - Primary   Relevant Medications   gemfibrozil (LOPID) 600 MG tablet   furosemide (LASIX) 40 MG tablet   simvastatin (ZOCOR) 40 MG tablet   metoprolol (LOPRESSOR) 50 MG tablet   Other Relevant Orders   Renal Function Panel     Digestive   Gastroesophageal reflux disease without esophagitis   Relevant Medications   omeprazole (PRILOSEC) 40 MG capsule     Endocrine   Type 2 diabetes mellitus with neurologic complication, without long-term current use of insulin (HCC)   Relevant Medications   glipiZIDE (GLUCOTROL) 5 MG tablet   simvastatin (ZOCOR) 40 MG tablet   Other Relevant Orders   Hemoglobin A1c     Nervous and Auditory   Neuropathy associated with endocrine disorder (HCC)   Relevant Medications   pregabalin (LYRICA) 200 MG capsule     Other   Morbid obesity (HCC)   Relevant Medications   glipiZIDE (GLUCOTROL) 5 MG tablet   B12 deficiency anemia   Relevant Orders   CBC w/Diff/Platelet    Hyperlipidemia   Relevant Medications   gemfibrozil (LOPID) 600 MG tablet   furosemide (LASIX) 40 MG tablet   simvastatin (ZOCOR) 40 MG tablet   metoprolol (LOPRESSOR) 50 MG tablet   Other Relevant Orders   Lipid Profile   Depression   Relevant Medications   sertraline (ZOLOFT) 50 MG tablet    Other Visit Diagnoses    Flu vaccine need       Relevant Orders   Flu Vaccine QUAD 36+ mos PF IM (Fluarix & Fluzone Quad PF) (Completed)        Dr. Jettie Laz[facility]  08/06/16

## 2016-08-07 LAB — RENAL FUNCTION PANEL
ALBUMIN: 4.3 g/dL (ref 3.6–4.8)
BUN/Creatinine Ratio: 18 (ref 10–24)
BUN: 25 mg/dL (ref 8–27)
CALCIUM: 10.1 mg/dL (ref 8.6–10.2)
CO2: 23 mmol/L (ref 18–29)
Chloride: 93 mmol/L — ABNORMAL LOW (ref 96–106)
Creatinine, Ser: 1.39 mg/dL — ABNORMAL HIGH (ref 0.76–1.27)
GFR, EST AFRICAN AMERICAN: 61 mL/min/{1.73_m2} (ref >59)
GFR, EST NON AFRICAN AMERICAN: 53 mL/min/{1.73_m2} — AB (ref >59)
Glucose: 329 mg/dL — ABNORMAL HIGH (ref 65–99)
PHOSPHORUS: 3.6 mg/dL (ref 2.5–4.5)
POTASSIUM: 4.2 mmol/L (ref 3.5–5.2)
SODIUM: 138 mmol/L (ref 134–144)

## 2016-08-07 LAB — CBC WITH DIFFERENTIAL/PLATELET
BASOS: 1 %
Basophils Absolute: 0.1 10*3/uL (ref 0.0–0.2)
EOS (ABSOLUTE): 0.2 10*3/uL (ref 0.0–0.4)
EOS: 4 %
HEMOGLOBIN: 13.7 g/dL (ref 12.6–17.7)
Hematocrit: 39.9 % (ref 37.5–51.0)
IMMATURE GRANS (ABS): 0 10*3/uL (ref 0.0–0.1)
IMMATURE GRANULOCYTES: 0 %
LYMPHS: 32 %
Lymphocytes Absolute: 1.7 10*3/uL (ref 0.7–3.1)
MCH: 31.4 pg (ref 26.6–33.0)
MCHC: 34.3 g/dL (ref 31.5–35.7)
MCV: 92 fL (ref 79–97)
MONOCYTES: 10 %
Monocytes Absolute: 0.6 10*3/uL (ref 0.1–0.9)
NEUTROS ABS: 2.8 10*3/uL (ref 1.4–7.0)
NEUTROS PCT: 53 %
PLATELETS: 150 10*3/uL (ref 150–379)
RBC: 4.36 x10E6/uL (ref 4.14–5.80)
RDW: 13.6 % (ref 12.3–15.4)
WBC: 5.3 10*3/uL (ref 3.4–10.8)

## 2016-08-07 LAB — LIPID PANEL
CHOL/HDL RATIO: 6.1 ratio — AB (ref 0.0–5.0)
Cholesterol, Total: 165 mg/dL (ref 100–199)
HDL: 27 mg/dL — ABNORMAL LOW (ref >39)
LDL Calculated: 75 mg/dL (ref 0–99)
Triglycerides: 315 mg/dL — ABNORMAL HIGH (ref 0–149)
VLDL Cholesterol Cal: 63 mg/dL — ABNORMAL HIGH (ref 5–40)

## 2016-08-09 LAB — HEMOGLOBIN A1C
Est. average glucose Bld gHb Est-mCnc: 364 mg/dL
Hgb A1c MFr Bld: 14.3 % — ABNORMAL HIGH (ref 4.8–5.6)

## 2016-08-10 ENCOUNTER — Ambulatory Visit (INDEPENDENT_AMBULATORY_CARE_PROVIDER_SITE_OTHER): Payer: Medicare Other | Admitting: Family Medicine

## 2016-08-10 VITALS — BP 120/62 | HR 60

## 2016-08-10 DIAGNOSIS — E538 Deficiency of other specified B group vitamins: Secondary | ICD-10-CM

## 2016-08-10 DIAGNOSIS — E1142 Type 2 diabetes mellitus with diabetic polyneuropathy: Secondary | ICD-10-CM | POA: Diagnosis not present

## 2016-08-10 MED ORDER — CYANOCOBALAMIN 1000 MCG/ML IJ SOLN
1000.0000 ug | Freq: Once | INTRAMUSCULAR | Status: AC
  Administered 2016-08-10: 1000 ug via INTRAMUSCULAR

## 2016-08-10 NOTE — Progress Notes (Signed)
Name: Jeremy [name]   MRN: [id number]    DOB: 02/19/1951   Date:08/10/2016       Progress Note  Subjective  Chief Complaint  Chief Complaint  Patient presents with  . Diabetes    Diabetes  He presents for his follow-up diabetic visit. He has type 2 diabetes mellitus. His disease course has been worsening. There are no hypoglycemic associated symptoms. Pertinent negatives for hypoglycemia include no dizziness, headaches or nervousness/anxiousness. Associated symptoms include fatigue, weakness and weight loss. Pertinent negatives for diabetes include no blurred vision, no chest pain and no polydipsia. There are no hypoglycemic complications. There are no diabetic complications. There are no known risk factors for coronary artery disease. Current diabetic treatment includes oral agent (monotherapy). He is compliant with treatment most of the time. His weight is stable. He is following a generally healthy diet. He has not had a previous visit with a dietitian. He rarely participates in exercise. His breakfast blood glucose is taken between 8-9 am. His breakfast blood glucose range is generally >200 mg/dl. An ACE inhibitor/angiotensin II receptor blocker is not being taken.    No problem-specific Assessment & Plan notes found for this encounter.   Past Medical History:  Diagnosis Date  . Chronic pain   . Depression   . Diabetes ([place])   . GERD (gastroesophageal reflux disease)   . Hyperlipemia   . Hypertension   . MS (multiple sclerosis) ([place])     Past Surgical History:  Procedure Laterality Date  . COLECTOMY  06-2008  . COLONOSCOPY  2015   normal    Family History  Problem Relation Age of Onset  . Cancer Mother   . Heart attack Father     Social History   Social History  . Marital status: Married    Spouse name: [name]  . Number of children: 2  . Years of education: GED   Occupational History  .      Disabled   Social History Main Topics  . Smoking status: Former  Smoker    Quit date: 01/23/2006  . Smokeless tobacco: Never Used  . Alcohol use 0.0 oz/week     Comment: rare  . Drug use: No  . Sexual activity: Not Currently   Other Topics Concern  . Not on file   Social History Narrative   Patient is disabled.    Patient lives with his wife [name].    Patient has 2 children.           Allergies  Allergen Reactions  . Betadine [Povidone Iodine]      Review of Systems  Constitutional: Positive for fatigue and weight loss. Negative for chills, fever and malaise/fatigue.  HENT: Negative for ear discharge, ear pain and sore throat.   Eyes: Negative for blurred vision.  Respiratory: Negative for cough, sputum production, shortness of breath and wheezing.   Cardiovascular: Negative for chest pain, palpitations and leg swelling.  Gastrointestinal: Negative for abdominal pain, blood in stool, constipation, diarrhea, heartburn, melena and nausea.  Genitourinary: Negative for dysuria, frequency, hematuria and urgency.  Musculoskeletal: Negative for back pain, joint pain, myalgias and neck pain.  Skin: Negative for rash.  Neurological: Positive for weakness. Negative for dizziness, tingling, sensory change, focal weakness and headaches.  Endo/Heme/Allergies: Negative for environmental allergies and polydipsia. Does not bruise/bleed easily.  Psychiatric/Behavioral: Negative for depression and suicidal ideas. The patient is not nervous/anxious and does not have insomnia.      Objective  Vitals:   08/10/16  0807  BP: 120/62  Pulse: 60    Physical Exam  Constitutional: He is oriented to person, place, and time and well-developed, well-nourished, and in no distress.  HENT:  Head: Normocephalic.  Right Ear: External ear normal.  Left Ear: External ear normal.  Nose: Nose normal.  Mouth/Throat: Oropharynx is clear and moist.  Eyes: Conjunctivae and EOM are normal. Pupils are equal, round, and reactive to light. Right eye exhibits no  discharge. Left eye exhibits no discharge. No scleral icterus.  Neck: Normal range of motion. Neck supple. No JVD present.  Cardiovascular: Normal rate, regular rhythm, normal heart sounds and intact distal pulses.  Exam reveals no gallop and no friction rub.   No murmur heard. Pulmonary/Chest: Breath sounds normal. No respiratory distress. He has no wheezes. He has no rales.  Abdominal: Soft. Bowel sounds are normal. He exhibits no mass. There is no hepatosplenomegaly. There is no tenderness. There is no rebound, no guarding and no CVA tenderness.  Musculoskeletal: Normal range of motion. He exhibits no edema.  Neurological: He is alert and oriented to person, place, and time. He has normal sensation, normal strength and intact cranial nerves. No cranial nerve deficit.  Skin: Skin is warm. No rash noted.  Psychiatric: Mood and affect normal.  Nursing note and vitals reviewed.     Assessment & Plan  Problem List Items Addressed This Visit      Endocrine   Type 2 diabetes mellitus with neurologic complication, without long-term current use of insulin ([place]) - Primary   Relevant Orders   Ambulatory referral to Endocrinology    Other Visit Diagnoses    B12 deficiency       Relevant Medications   cyanocobalamin ((VITAMIN B-12)) injection 1,000 mcg (Completed)        Dr. [facility]  08/10/16

## 2016-08-17 ENCOUNTER — Encounter: Payer: Self-pay | Admitting: Family Medicine

## 2016-08-17 ENCOUNTER — Ambulatory Visit (INDEPENDENT_AMBULATORY_CARE_PROVIDER_SITE_OTHER): Payer: Medicare Other | Admitting: Family Medicine

## 2016-08-17 VITALS — BP 110/70 | HR 80 | Ht 70.0 in | Wt 243.0 lb

## 2016-08-17 DIAGNOSIS — E119 Type 2 diabetes mellitus without complications: Secondary | ICD-10-CM | POA: Diagnosis not present

## 2016-08-17 DIAGNOSIS — Z794 Long term (current) use of insulin: Secondary | ICD-10-CM

## 2016-08-17 NOTE — Progress Notes (Signed)
Name: Jeremy [name]   MRN: [id number]    DOB: 10/10/1951   Date:08/17/2016       Progress Note  Subjective  Chief Complaint  Chief Complaint  Patient presents with  . Diabetes    follow up- started on insulin- numbers have improved but still running in the 200s    Diabetes  He presents for his follow-up diabetic visit. He has type 2 diabetes mellitus. His disease course has been stable. Pertinent negatives for hypoglycemia include no confusion, dizziness, headaches, hunger, mood changes, nervousness/anxiousness, pallor, seizures, sleepiness, speech difficulty, sweats or tremors. Pertinent negatives for diabetes include no blurred vision, no chest pain, no fatigue, no foot paresthesias, no foot ulcerations, no polydipsia, no polyphagia, no polyuria, no visual change, no weakness and no weight loss. There are no hypoglycemic complications. Symptoms are stable. There are no diabetic complications. There are no known risk factors for coronary artery disease. Current diabetic treatment includes insulin injections and oral agent (monotherapy). He is following a generally healthy diet. He rarely participates in exercise. His breakfast blood glucose is taken between 8-9 am. His breakfast blood glucose range is generally 180-200 mg/dl. He does not see a podiatrist.Eye exam is not current.    No problem-specific Assessment & Plan notes found for this encounter.   Past Medical History:  Diagnosis Date  . Chronic pain   . Depression   . Diabetes ([place])   . GERD (gastroesophageal reflux disease)   . Hyperlipemia   . Hypertension   . MS (multiple sclerosis) ([place])     Past Surgical History:  Procedure Laterality Date  . COLECTOMY  06-2008  . COLONOSCOPY  2015   normal    Family History  Problem Relation Age of Onset  . Cancer Mother   . Heart attack Father     Social History   Social History  . Marital status: Married    Spouse name: [name]  . Number of children: 2  . Years of  education: GED   Occupational History  .      Disabled   Social History Main Topics  . Smoking status: Former Smoker    Quit date: 01/23/2006  . Smokeless tobacco: Never Used  . Alcohol use 0.0 oz/week     Comment: rare  . Drug use: No  . Sexual activity: Not Currently   Other Topics Concern  . Not on file   Social History Narrative   Patient is disabled.    Patient lives with his wife Gina Mcglade.    Patient has 2 children.           Allergies  Allergen Reactions  . Betadine [Povidone Iodine]      Review of Systems  Constitutional: Negative for chills, fatigue, fever, malaise/fatigue and weight loss.  HENT: Negative for ear discharge, ear pain and sore throat.   Eyes: Negative for blurred vision.  Respiratory: Negative for cough, sputum production, shortness of breath and wheezing.   Cardiovascular: Negative for chest pain, palpitations and leg swelling.  Gastrointestinal: Negative for abdominal pain, blood in stool, constipation, diarrhea, heartburn, melena and nausea.  Genitourinary: Negative for dysuria, frequency, hematuria and urgency.  Musculoskeletal: Negative for back pain, joint pain, myalgias and neck pain.  Skin: Negative for pallor and rash.  Neurological: Negative for dizziness, tingling, tremors, sensory change, focal weakness, seizures, speech difficulty, weakness and headaches.  Endo/Heme/Allergies: Negative for environmental allergies, polydipsia and polyphagia. Does not bruise/bleed easily.  Psychiatric/Behavioral: Negative for confusion, depression and suicidal ideas. The  patient is not nervous/anxious and does not have insomnia.      Objective  Vitals:   08/17/16 0758  BP: 110/70  Pulse: 80  Weight: 243 lb (110.2 kg)  Height: 5\' 10"  (1.778 m)    Physical Exam  Constitutional: He is oriented to person, place, and time and well-developed, well-nourished, and in no distress.  HENT:  Head: Normocephalic.  Right Ear: External ear normal.   Left Ear: External ear normal.  Nose: Nose normal.  Mouth/Throat: Oropharynx is clear and moist.  Eyes: Conjunctivae and EOM are normal. Pupils are equal, round, and reactive to light. Right eye exhibits no discharge. Left eye exhibits no discharge. No scleral icterus.  Neck: Normal range of motion. Neck supple. No JVD present. No tracheal deviation present. No thyromegaly present.  Cardiovascular: Normal rate, regular rhythm, normal heart sounds and intact distal pulses.  Exam reveals no gallop and no friction rub.   No murmur heard. Pulmonary/Chest: Breath sounds normal. No respiratory distress. He has no wheezes. He has no rales.  Abdominal: Soft. Bowel sounds are normal. He exhibits no mass. There is no hepatosplenomegaly. There is no tenderness. There is no rebound, no guarding and no CVA tenderness.  Musculoskeletal: Normal range of motion. He exhibits no edema or tenderness.  Lymphadenopathy:    He has no cervical adenopathy.  Neurological: He is alert and oriented to person, place, and time. He has normal sensation, normal strength and intact cranial nerves. No cranial nerve deficit.  Skin: Skin is warm. No rash noted.  Psychiatric: Mood and affect normal.  Nursing note and vitals reviewed.     Assessment & Plan  Problem List Items Addressed This Visit    None    Visit Diagnoses    Type 2 diabetes mellitus without complication, with long-term current use of insulin ([place])    -  Primary   increase 2 unots for 1 week then increase 2 units for total 14/ recheck 3 weeks        Dr. [facility]  08/17/16

## 2016-08-24 ENCOUNTER — Ambulatory Visit (INDEPENDENT_AMBULATORY_CARE_PROVIDER_SITE_OTHER): Payer: Medicare Other

## 2016-08-24 DIAGNOSIS — E538 Deficiency of other specified B group vitamins: Secondary | ICD-10-CM

## 2016-08-24 MED ORDER — CYANOCOBALAMIN 1000 MCG/ML IJ SOLN
1000.0000 ug | Freq: Once | INTRAMUSCULAR | Status: AC
  Administered 2016-08-24: 1000 ug via INTRAMUSCULAR

## 2016-08-27 ENCOUNTER — Other Ambulatory Visit: Payer: Self-pay | Admitting: Family Medicine

## 2016-08-27 DIAGNOSIS — G63 Polyneuropathy in diseases classified elsewhere: Principal | ICD-10-CM

## 2016-08-27 DIAGNOSIS — E349 Endocrine disorder, unspecified: Secondary | ICD-10-CM

## 2016-08-30 ENCOUNTER — Other Ambulatory Visit: Payer: Self-pay | Admitting: Family Medicine

## 2016-09-03 ENCOUNTER — Other Ambulatory Visit: Payer: Self-pay | Admitting: Family Medicine

## 2016-09-03 DIAGNOSIS — K219 Gastro-esophageal reflux disease without esophagitis: Secondary | ICD-10-CM

## 2016-09-06 DIAGNOSIS — Z794 Long term (current) use of insulin: Secondary | ICD-10-CM | POA: Diagnosis not present

## 2016-09-06 DIAGNOSIS — E1165 Type 2 diabetes mellitus with hyperglycemia: Secondary | ICD-10-CM | POA: Diagnosis not present

## 2016-09-06 DIAGNOSIS — Z79899 Other long term (current) drug therapy: Secondary | ICD-10-CM | POA: Diagnosis not present

## 2016-09-07 ENCOUNTER — Encounter: Payer: Self-pay | Admitting: Family Medicine

## 2016-09-07 ENCOUNTER — Ambulatory Visit (INDEPENDENT_AMBULATORY_CARE_PROVIDER_SITE_OTHER): Payer: Medicare Other | Admitting: Family Medicine

## 2016-09-07 VITALS — BP 120/68 | HR 68 | Ht 70.0 in | Wt 242.0 lb

## 2016-09-07 DIAGNOSIS — E119 Type 2 diabetes mellitus without complications: Secondary | ICD-10-CM | POA: Diagnosis not present

## 2016-09-07 DIAGNOSIS — D518 Other vitamin B12 deficiency anemias: Secondary | ICD-10-CM | POA: Diagnosis not present

## 2016-09-07 DIAGNOSIS — Z794 Long term (current) use of insulin: Secondary | ICD-10-CM

## 2016-09-07 DIAGNOSIS — H40003 Preglaucoma, unspecified, bilateral: Secondary | ICD-10-CM | POA: Diagnosis not present

## 2016-09-07 DIAGNOSIS — I10 Essential (primary) hypertension: Secondary | ICD-10-CM | POA: Diagnosis not present

## 2016-09-07 MED ORDER — CYANOCOBALAMIN 1000 MCG/ML IJ SOLN
1000.0000 ug | Freq: Once | INTRAMUSCULAR | Status: AC
  Administered 2016-09-07: 1000 ug via INTRAMUSCULAR

## 2016-09-07 NOTE — Progress Notes (Signed)
Name: Jeremy [name]   MRN: [id number]    DOB: 01/02/1951   Date:09/07/2016       Progress Note  Subjective  Chief Complaint  Chief Complaint  Patient presents with  . vitamin b 12 deficiency    Diabetes  He presents for his follow-up diabetic visit. He has type 2 diabetes mellitus. His disease course has been worsening. There are no hypoglycemic associated symptoms. Pertinent negatives for hypoglycemia include no dizziness, headaches or nervousness/anxiousness. Pertinent negatives for diabetes include no blurred vision, no chest pain, no fatigue, no foot paresthesias, no foot ulcerations, no polydipsia, no polyphagia, no polyuria, no visual change, no weakness and no weight loss. There are no hypoglycemic complications. Symptoms are stable. There are no diabetic complications. Risk factors for coronary artery disease include dyslipidemia. Current diabetic treatment includes oral agent (monotherapy). He is compliant with treatment some of the time. He is currently taking insulin at bedtime (basiglar 30 to 40). Insulin injections are given by patient. His weight is stable. He is following a generally healthy diet. His breakfast blood glucose is taken between 8-9 am. His breakfast blood glucose range is generally >200 mg/dl. An ACE inhibitor/angiotensin II receptor blocker is being taken.  Hypertension  This is a chronic problem. The current episode started more than 1 year ago. Pertinent negatives include no blurred vision, chest pain, headaches, malaise/fatigue, neck pain, palpitations or shortness of breath. There are no associated agents to hypertension. Past treatments include beta blockers. The current treatment provides moderate improvement. There are no compliance problems.     No problem-specific Assessment & Plan notes found for this encounter.   Past Medical History:  Diagnosis Date  . Chronic pain   . Depression   . Diabetes ([place])   . GERD (gastroesophageal reflux disease)   .  Hyperlipemia   . Hypertension   . MS (multiple sclerosis) ([place])     Past Surgical History:  Procedure Laterality Date  . COLECTOMY  06-2008  . COLONOSCOPY  2015   normal    Family History  Problem Relation Age of Onset  . Cancer Mother   . Heart attack Father     Social History   Social History  . Marital status: Married    Spouse name: [name]  . Number of children: 2  . Years of education: GED   Occupational History  .      Disabled   Social History Main Topics  . Smoking status: Former Smoker    Quit date: 01/23/2006  . Smokeless tobacco: Never Used  . Alcohol use 0.0 oz/week     Comment: rare  . Drug use: No  . Sexual activity: Not Currently   Other Topics Concern  . Not on file   Social History Narrative   Patient is disabled.    Patient lives with his wife Phinehas Leesman.    Patient has 2 children.           Allergies  Allergen Reactions  . Betadine [Povidone Iodine]      Review of Systems  Constitutional: Negative for chills, fatigue, fever, malaise/fatigue and weight loss.  HENT: Negative for ear discharge, ear pain and sore throat.   Eyes: Negative for blurred vision.  Respiratory: Negative for cough, sputum production, shortness of breath and wheezing.   Cardiovascular: Negative for chest pain, palpitations and leg swelling.  Gastrointestinal: Negative for abdominal pain, blood in stool, constipation, diarrhea, heartburn, melena and nausea.  Genitourinary: Negative for dysuria, frequency, hematuria and urgency.  Musculoskeletal: Negative for back pain, joint pain, myalgias and neck pain.  Skin: Negative for rash.  Neurological: Negative for dizziness, tingling, sensory change, focal weakness, weakness and headaches.  Endo/Heme/Allergies: Negative for environmental allergies, polydipsia and polyphagia. Does not bruise/bleed easily.  Psychiatric/Behavioral: Negative for depression and suicidal ideas. The patient is not nervous/anxious and does not  have insomnia.      Objective  Vitals:   09/07/16 0758  BP: 120/68  Pulse: 68  Weight: 242 lb (109.8 kg)  Height: 5\' 10"  (1.778 m)    Physical Exam  Constitutional: He is oriented to person, place, and time and well-developed, well-nourished, and in no distress.  HENT:  Head: Normocephalic.  Right Ear: Tympanic membrane, external ear and ear canal normal.  Left Ear: Tympanic membrane, external ear and ear canal normal.  Nose: Nose normal.  Mouth/Throat: Oropharynx is clear and moist.  Eyes: Conjunctivae and EOM are normal. Pupils are equal, round, and reactive to light. Right eye exhibits no discharge. Left eye exhibits no discharge. No scleral icterus.  Neck: Normal range of motion. Neck supple. No JVD present. No tracheal deviation present. No thyromegaly present.  Cardiovascular: Normal rate, regular rhythm, normal heart sounds and intact distal pulses.  Exam reveals no gallop and no friction rub.   No murmur heard. Pulmonary/Chest: Breath sounds normal. No respiratory distress. He has no wheezes. He has no rales.  Abdominal: Soft. Bowel sounds are normal. There is no hepatosplenomegaly. There is no tenderness. There is no CVA tenderness.  Musculoskeletal: Normal range of motion. He exhibits no edema or tenderness.  Lymphadenopathy:    He has no cervical adenopathy.  Neurological: He is alert and oriented to person, place, and time. He has normal sensation, normal strength and intact cranial nerves. No cranial nerve deficit.  Skin: Skin is warm. No rash noted.  Psychiatric: Mood and affect normal.  Nursing note and vitals reviewed.     Assessment & Plan  Problem List Items Addressed This Visit      Cardiovascular and Mediastinum   Essential hypertension - Primary     Other   B12 deficiency anemia   Relevant Medications   cyanocobalamin ((VITAMIN B-12)) injection 1,000 mcg (Completed)    Other Visit Diagnoses    Type 2 diabetes mellitus without complication, with  long-term current use of insulin ([facility])       Relevant Medications   metFORMIN (GLUCOPHAGE) 500 MG tablet   Insulin Glargine (LANTUS) 100 UNIT/ML Solostar Pen        Dr. [facility]  09/07/16

## 2016-09-13 ENCOUNTER — Ambulatory Visit (INDEPENDENT_AMBULATORY_CARE_PROVIDER_SITE_OTHER): Payer: Medicare Other | Admitting: Nurse Practitioner

## 2016-09-13 ENCOUNTER — Encounter: Payer: Self-pay | Admitting: Nurse Practitioner

## 2016-09-13 VITALS — BP 128/56 | HR 72 | Ht 70.0 in | Wt 242.6 lb

## 2016-09-13 DIAGNOSIS — G35 Multiple sclerosis: Secondary | ICD-10-CM

## 2016-09-13 DIAGNOSIS — E349 Endocrine disorder, unspecified: Secondary | ICD-10-CM

## 2016-09-13 DIAGNOSIS — G8929 Other chronic pain: Secondary | ICD-10-CM | POA: Diagnosis not present

## 2016-09-13 DIAGNOSIS — G63 Polyneuropathy in diseases classified elsewhere: Secondary | ICD-10-CM | POA: Diagnosis not present

## 2016-09-13 DIAGNOSIS — M5441 Lumbago with sciatica, right side: Secondary | ICD-10-CM

## 2016-09-13 DIAGNOSIS — R269 Unspecified abnormalities of gait and mobility: Secondary | ICD-10-CM | POA: Diagnosis not present

## 2016-09-13 NOTE — Progress Notes (Signed)
[facility]  PATIENT: Jeremy [name] DOB: [birth year]   REASON FOR VISIT: Follow-up for multiple sclerosis HISTORY FROM: Patient    HISTORY OF PRESENT ILLNESS:HISTORY:He has PMHx of diabetes, hypertension and multiple sclerosis. The diagnosis of multiple sclerosis was confirmed by abnormal MRI brain and cervical, and a spinal fluid testing. He was initially followed by Dr. Estella Husk in 2008. Last seen in this office Oct 2014, He continues to have gait difficulties and relies on a cane for ambulation. He has had no problems with his vision or bowel/bladder control.  He has had occasional falls.He has ongoing pain in his legs. Last MS flare was over 2 years ago. He is currently on Betaseron every other day, treatment started in 2007.   MRI cervical in 2013 with findings consistent with MS plaques and no enhancement,mild spinal stenosis.  MRI of the brain with prominent white matter changes consistent with MS. No active areas of demyelination. He continues to have problems with low   The initial symptom was acute onset of left leg more than arm weakness, gait difficulty in 2006. This eventually led to the diagnosis of relapsing remitting multiple sclerosis. He has been receiving Betaseron treatment since early 2007. He has tolerated the medication well. He has had no problems with lipoatrophy or skin necrosis. In September 2009, he developed GI bleeding. Workup had demonstrated colon inflammation and obstruction.He underwent colectomy and recovered well from that procedure.  He continues to have gait difficulties and relies on a cane for ambulation. He has had no problems with his vision or bowel/bladder control. He does have chronic extremity pain, which has been managed with Lyrica and Tramadol.   He uses an Transport planner for situations in which he would need to walk long distances. He has had occasional falls.He has ongoing pain in his legs  UPDATE  07/2014: He was admitted to hospital in Nov 2014 for anemia, low blood count, was evaluated by hematologist in [facility], now has recovered, he still has moderate gait difficulty.also complains bilateral lower extremity spasticity, is taking Lyrica, there was no clinical flareups, last MRI was in 2013, there was significant lesions in brain, and cervical spine  UPDATE 09/03/2014: He has tried Baclofen 72m qhs, complains of excessive sleepiness, could not tolerate it, he still mows the yard, the most limitation is his gait difficulty,he denies bowel and bladder incontinence.  We have reviewed MRI togather, MRI scan of brain showing periventricular and subcortical white matter hyperintensities consistent with multiple sclerosis. Presence of atrophy of corpus callosum indicates chronic disease. No significant change compared with MRI 06/02/2012   MRI cervical spine showing mild spondylitic changes descibed above most prominent at C 3-4 where there is moderate left formaminal narrowing. Ill defined spinal cord hypertintensities at C 3-4 to c 6-7 likely represent chronic multiple sclerosis inactive plaques. No significant change compared with MRI C spine 06/02/2012  UPDATE 11//17/16 Jeremy [name] 65year old male returns for follow-up. He was last seen in this office 03/06/15.  He was diagnosed with multiple sclerosis in 2006. He is currently on Betaseron every other day without injection site issues.. He still remains fairly active mowing the yard, denies any bowel or bladder incontinence. He denies any sensory changes, double vision speech or swallowing difficulty. He ambulates with a cane. He has had a couple of falls since last seen. He returns for reevaluation  UPDATE May 17th 2017:YY He is with his wife at today's clinical visit, he continued to receive Betaseron every other day without  significant side effect, he has significant gait difficulty, left side is weaker, complains of chronic fatigue, chronic low  back pain, bilateral lower extremity neuropathic pain, taking Lyrica, tramadol as needed, Reviewed laboratory evaluation, A1c 7.9, CMP showed elevated creatinine 1.4, normal CBC We again personally reviewed MRI of the scan without contrast, most recent was in 2015, MRI of the brain showed multiple supratentorium lesions, no contrast enhancement, MRI of cervical spine showed ill-defined C3-C7 cord lesion, no contrast enhancement, no significant change compared to previous scans UPDATE 11/20/2017CM Jeremy [name], 65 year old male returns for follow-up with his wife is a history of multiple sclerosis and has been off of his Betaseron for approximately a week. He is trying to get patient assistance. Most recent MRI of the brain showed multiple supratentorium lesions no contrast enhancement. MRI of cervical spine showing ill-defined C3-C7 cord lesion that contrast enhancement, no significant changes when compared to previous scans. He continues to complain with some fatigue. He ambulates with a cane he denies any recent falls. He remains on Lyrica and tramadol with benefit. He returns for reevaluation   REVIEW OF SYSTEMS: Full 14 system review of systems performed and notable only for those listed, all others are neg:  Constitutional: Fatigue  Cardiovascular: Swelling in the legs Ear/Nose/Throat: neg  Skin: neg Eyes: neg Respiratory: neg Gastroitestinal: neg  Hematology/Lymphatic: Anemia Endocrine: neg Musculoskeletal:neg Allergy/Immunology: neg Neurological: Weakness Psychiatric: Depression Sleep : neg   ALLERGIES: Allergies  Allergen Reactions  . Betadine [Povidone Iodine]     HOME MEDICATIONS: Outpatient Medications Prior to Visit  Medication Sig Dispense Refill  . aspirin 81 MG tablet Take 81 mg by mouth daily.    . Cyanocobalamin (VITAMIN B-12 IJ) Inject as directed. Every other week.    . docusate sodium (COLACE) 50 MG capsule Take 50 mg by mouth 2 (two) times daily. otc    .  ferrous sulfate 325 (65 FE) MG tablet TAKE 1 TABLET BY MOUTH THREE TIMES DAILY 270 tablet 0  . furosemide (LASIX) 40 MG tablet Take 1 tablet (40 mg total) by mouth 2 (two) times daily. 180 tablet 1  . gemfibrozil (LOPID) 600 MG tablet Take 1 tablet (600 mg total) by mouth 2 (two) times daily before a meal. 180 tablet 1  . glipiZIDE (GLUCOTROL) 5 MG tablet TAKE 1 TABLET(5 MG) BY MOUTH TWICE DAILY BEFORE A MEAL (Patient taking differently: TAKE 1 TABLET(5 MG) BY MOUTH every am- Dr Maretta Bees) 180 tablet 0  . Insulin Glargine (LANTUS) 100 UNIT/ML Solostar Pen Inject 40 Units into the skin daily. Dr Maretta Bees    . LYRICA 200 MG capsule TAKE ONE CAPSULE BY MOUTH TWICE DAILY 60 capsule 0  . metFORMIN (GLUCOPHAGE) 500 MG tablet Take 1 tablet by mouth 2 (two) times daily. Dr Maretta Bees    . mupirocin ointment (BACTROBAN) 2 % Apply 1 application topically 2 (two) times daily. PRN    . omeprazole (PRILOSEC) 40 MG capsule TAKE 1 CAPSULE(40 MG) BY MOUTH DAILY 90 capsule 0  . sertraline (ZOLOFT) 50 MG tablet Take 0.5 tablets (25 mg total) by mouth daily. 45 tablet 1  . simvastatin (ZOCOR) 40 MG tablet TAKE 1 TABLET(40 MG) BY MOUTH DAILY 90 tablet 1  . traMADol (ULTRAM) 50 MG tablet TAKE 1 TABLET BY MOUTH EVERY 6 HOURS AS NEEDED FOR PAIN 120 tablet 5  . omeprazole (PRILOSEC) 40 MG capsule TAKE 1 CAPSULE(40 MG) BY MOUTH DAILY 90 capsule 1  . Interferon Beta-1b (BETASERON) 0.3 MG KIT injection Inject subcutaneously 1  syringe every other day (Patient not taking: Reported on 09/13/2016) 42 each 6  . metoprolol (LOPRESSOR) 50 MG tablet Take 0.5 tablets (25 mg total) by mouth daily. (Patient not taking: Reported on 09/13/2016) 45 tablet 1   No facility-administered medications prior to visit.     PAST MEDICAL HISTORY: Past Medical History:  Diagnosis Date  . Chronic pain   . Depression   . Diabetes ([place])   . GERD (gastroesophageal reflux disease)   . Hyperlipemia   . Hypertension   . MS (multiple sclerosis) ([place])      PAST SURGICAL HISTORY: Past Surgical History:  Procedure Laterality Date  . COLECTOMY  06-2008  . COLONOSCOPY  2015   normal    FAMILY HISTORY: Family History  Problem Relation Age of Onset  . Cancer Mother   . Heart attack Father     SOCIAL HISTORY: Social History   Social History  . Marital status: Married    Spouse name: [name]  . Number of children: 2  . Years of education: GED   Occupational History  .      Disabled   Social History Main Topics  . Smoking status: Former Smoker    Quit date: 01/23/2006  . Smokeless tobacco: Never Used  . Alcohol use 0.0 oz/week     Comment: rare  . Drug use: No  . Sexual activity: Not Currently   Other Topics Concern  . Not on file   Social History Narrative   Patient is disabled.    Patient lives with his wife Orson Rho.    Patient has 2 children.            PHYSICAL EXAM  Vitals:   09/13/16 1324  BP: (!) 128/56  Pulse: 72  Weight: 242 lb 9.6 oz (110 kg)  Height: 5' 10"  (1.778 m)   Body mass index is 34.81 kg/m.  Generalized: Well developed, obese male in no acute distress  Head: normocephalic and atraumatic,. Oropharynx benign  Neck: Supple, no carotid bruits  Cardiac: Regular rate rhythm, no murmur  Musculoskeletal: Mild left hemiparesis  Neurological examination   Mentation: Alert oriented to time, place, history taking. Attention span and concentration appropriate. Recent and remote memory intact.  Follows all commands speech and language fluent.   Cranial nerve II-XII: Fundoscopic exam reveals sharp disc margins.Pupils were equal round reactive to light extraocular movements were full, visual field were full on confrontational test. Facial sensation and strength were normal. hearing was intact to finger rubbing bilaterally. Uvula tongue midline. head turning and shoulder shrug were normal and symmetric.Tongue protrusion into cheek strength was normal. Motor: Mild left-sided weakness Sensory:  normal and symmetric to light touch, pinprick, and  Vibration, in the upper and lower extremities Coordination: finger-nose-finger, heel-to-shin bilaterally, no dysmetria Reflexes: Hyperreflexia of both lower extremities, plantar responses were flexor bilaterally. Gait and Station:Need to push up to get up from seated position wide based, stiff, unsteady left hemi-circumferential gait . Ambulates with single-point cane DIAGNOSTIC DATA (LABS, IMAGING, TESTING) - I reviewed patient records, labs, notes, testing and imaging myself where available.  Lab Results  Component Value Date   WBC 5.3 08/06/2016   HGB 13.3 07/30/2014   HCT 39.9 08/06/2016   MCV 92 08/06/2016   PLT 150 08/06/2016      Component Value Date/Time   NA 138 08/06/2016 0847   NA 143 08/31/2013 0417   K 4.2 08/06/2016 0847   K 3.5 08/31/2013 0417   CL 93 (L) 08/06/2016  0847   CL 109 (H) 08/31/2013 0417   CO2 23 08/06/2016 0847   CO2 28 08/31/2013 0417   GLUCOSE 329 (H) 08/06/2016 0847   GLUCOSE 133 (H) 08/31/2013 0417   BUN 25 08/06/2016 0847   BUN 39 (H) 08/31/2013 0417   CREATININE 1.39 (H) 08/06/2016 0847   CREATININE 1.53 (H) 08/31/2013 0417   CALCIUM 10.1 08/06/2016 0847   CALCIUM 8.9 08/31/2013 0417   PROT 7.3 09/11/2015 1440   PROT 6.2 (L) 08/31/2013 0417   ALBUMIN 4.3 08/06/2016 0847   ALBUMIN 2.9 (L) 08/31/2013 0417   AST 24 09/11/2015 1440   AST 35 08/31/2013 0417   ALT 11 09/11/2015 1440   ALT 15 08/31/2013 0417   ALKPHOS 80 09/11/2015 1440   ALKPHOS 60 08/31/2013 0417   BILITOT 0.3 09/11/2015 1440   BILITOT 0.6 08/31/2013 0417   GFRNONAA 53 (L) 08/06/2016 0847   GFRNONAA 48 (L) 08/31/2013 0417   GFRAA 61 08/06/2016 0847   GFRAA 56 (L) 08/31/2013 0417   Lab Results  Component Value Date   CHOL 165 08/06/2016   HDL 27 (L) 08/06/2016   LDLCALC 75 08/06/2016   TRIG 315 (H) 08/06/2016   CHOLHDL 6.1 (H) 08/06/2016   Lab Results  Component Value Date   HGBA1C 14.3 (H) 08/06/2016     ASSESSMENT AND PLAN  65 y.o. year old male  has a past medical history of Chronic pain; Depression; Diabetes ([place]); GERD (gastroesophageal reflux disease); Hyperlipemia; Hypertension; and MS (multiple sclerosis) ([place]). here To follow-up for his multiple sclerosis The patient is a current patient of Dr. Krista Blue  who is out of the office today . This note is sent to the work in doctor.     Continue Betaseron injection every other day, attempting to get through patient assistance Reviewed CBC from recent labs drawn 08/06/2016 . Hemoglobin A1c 14.3  Continue Lyrica 200 mg twice a day  Tramadol as needed for chronic pain, Use cane at all times, patient has significant gait abnormality along with chronic low back pain F/U in 6 months Dennie Bible, [facility], [facility], APRN  [facility] [address] [place],  [id number] [phone]

## 2016-09-13 NOTE — Progress Notes (Signed)
I agree with the assessment and plan as directed by NP .WID   Mckinze Poirier, MD  

## 2016-09-13 NOTE — Patient Instructions (Addendum)
Continue Betaseron injection every other day Lyrica 200 mg twice a day Tramadol as needed for chronic pain, Reviewed recent labs  Use cane at all times at risk for falls  F/U in 6 months

## 2016-09-21 ENCOUNTER — Ambulatory Visit (INDEPENDENT_AMBULATORY_CARE_PROVIDER_SITE_OTHER): Payer: Medicare Other

## 2016-09-21 DIAGNOSIS — E538 Deficiency of other specified B group vitamins: Secondary | ICD-10-CM

## 2016-09-21 MED ORDER — CYANOCOBALAMIN 1000 MCG/ML IJ SOLN
1000.0000 ug | Freq: Once | INTRAMUSCULAR | Status: AC
  Administered 2016-09-21: 1000 ug via INTRAMUSCULAR

## 2016-09-27 DIAGNOSIS — B351 Tinea unguium: Secondary | ICD-10-CM | POA: Diagnosis not present

## 2016-09-27 DIAGNOSIS — M79675 Pain in left toe(s): Secondary | ICD-10-CM | POA: Diagnosis not present

## 2016-09-27 DIAGNOSIS — E1142 Type 2 diabetes mellitus with diabetic polyneuropathy: Secondary | ICD-10-CM | POA: Diagnosis not present

## 2016-09-27 DIAGNOSIS — M79674 Pain in right toe(s): Secondary | ICD-10-CM | POA: Diagnosis not present

## 2016-09-27 DIAGNOSIS — L03032 Cellulitis of left toe: Secondary | ICD-10-CM | POA: Diagnosis not present

## 2016-10-05 ENCOUNTER — Encounter: Payer: Self-pay | Admitting: Family Medicine

## 2016-10-05 ENCOUNTER — Ambulatory Visit (INDEPENDENT_AMBULATORY_CARE_PROVIDER_SITE_OTHER): Payer: Medicare Other | Admitting: Family Medicine

## 2016-10-05 VITALS — BP 120/70 | HR 78 | Ht 70.0 in | Wt 239.0 lb

## 2016-10-05 DIAGNOSIS — J40 Bronchitis, not specified as acute or chronic: Secondary | ICD-10-CM

## 2016-10-05 DIAGNOSIS — D519 Vitamin B12 deficiency anemia, unspecified: Secondary | ICD-10-CM | POA: Diagnosis not present

## 2016-10-05 DIAGNOSIS — J0100 Acute maxillary sinusitis, unspecified: Secondary | ICD-10-CM

## 2016-10-05 MED ORDER — GUAIFENESIN-CODEINE 100-10 MG/5ML PO SYRP: 5.0000 mL | ORAL_SOLUTION | Freq: Three times a day (TID) | ORAL | 0 refills | Status: DC | PRN

## 2016-10-05 MED ORDER — CYANOCOBALAMIN 1000 MCG/ML IJ SOLN
1000.0000 ug | Freq: Once | INTRAMUSCULAR | Status: AC
Start: 2016-10-05 — End: 2016-10-05
  Administered 2016-10-05: 1000 ug via INTRAMUSCULAR

## 2016-10-05 MED ORDER — AMOXICILLIN-POT CLAVULANATE 875-125 MG PO TABS: 1.0000 | ORAL_TABLET | Freq: Two times a day (BID) | ORAL | 0 refills | Status: DC

## 2016-10-05 NOTE — Progress Notes (Signed)
Name: Jeremy [name]   MRN: [id number]    DOB: [birth year]   Date:10/05/2016       Progress Note  Subjective  Chief Complaint  Chief Complaint  Patient presents with  . b12 deficiency    had injection already  . Sinusitis    cough and cong, drainage- green production    Sinusitis  This is a new problem. The current episode started in the past 7 days. The problem has been gradually worsening since onset. There has been no fever. His pain is at a severity of 3/10. The pain is mild. Associated symptoms include congestion, coughing, headaches, sinus pressure, a sore throat and swollen glands. Pertinent negatives include no chills, diaphoresis, ear pain, neck pain, shortness of breath or sneezing. Past treatments include acetaminophen. The treatment provided mild relief.  Cough  The current episode started in the past 7 days. The problem has been gradually worsening. The cough is productive of purulent sputum. Associated symptoms include headaches, myalgias, nasal congestion, postnasal drip and a sore throat. Pertinent negatives include no chest pain, chills, ear pain, fever, heartburn, rash, shortness of breath, weight loss or wheezing. The treatment provided mild relief. There is no history of environmental allergies.    No problem-specific Assessment & Plan notes found for this encounter.   Past Medical History:  Diagnosis Date  . Chronic pain   . Depression   . Diabetes ([place])   . GERD (gastroesophageal reflux disease)   . Hyperlipemia   . Hypertension   . MS (multiple sclerosis) ([place])     Past Surgical History:  Procedure Laterality Date  . COLECTOMY  06-2008  . COLONOSCOPY  2015   normal    Family History  Problem Relation Age of Onset  . Cancer Mother   . Heart attack Father     Social History   Social History  . Marital status: Married    Spouse name: [name]  . Number of children: 2  . Years of education: GED   Occupational History  .      Disabled   Social  History Main Topics  . Smoking status: Former Smoker    Quit date: 01/23/2006  . Smokeless tobacco: Never Used  . Alcohol use 0.0 oz/week     Comment: rare  . Drug use: No  . Sexual activity: Not Currently   Other Topics Concern  . Not on file   Social History Narrative   Patient is disabled.    Patient lives with his wife Jwan Millay.    Patient has 2 children.           Allergies  Allergen Reactions  . Betadine [Povidone Iodine]      Review of Systems  Constitutional: Negative for chills, diaphoresis, fever, malaise/fatigue and weight loss.  HENT: Positive for congestion, postnasal drip, sinus pressure and sore throat. Negative for ear discharge, ear pain and sneezing.   Eyes: Negative for blurred vision.  Respiratory: Positive for cough. Negative for sputum production, shortness of breath and wheezing.   Cardiovascular: Negative for chest pain, palpitations and leg swelling.  Gastrointestinal: Negative for abdominal pain, blood in stool, constipation, diarrhea, heartburn, melena and nausea.  Genitourinary: Negative for dysuria, frequency, hematuria and urgency.  Musculoskeletal: Positive for myalgias. Negative for back pain, joint pain and neck pain.  Skin: Negative for rash.  Neurological: Positive for headaches. Negative for dizziness, tingling, sensory change and focal weakness.  Endo/Heme/Allergies: Negative for environmental allergies and polydipsia. Does not bruise/bleed easily.  Psychiatric/Behavioral:  Negative for depression and suicidal ideas. The patient is not nervous/anxious and does not have insomnia.      Objective  Vitals:   10/05/16 0809  BP: 120/70  Pulse: 78  Weight: 239 lb (108.4 kg)  Height: 5\' 10"  (1.778 m)    Physical Exam  Constitutional: He is oriented to person, place, and time and well-developed, well-nourished, and in no distress.  HENT:  Head: Normocephalic.  Right Ear: External ear normal.  Left Ear: External ear normal.  Nose:  Nose normal.  Mouth/Throat: Oropharynx is clear and moist.  Eyes: Conjunctivae and EOM are normal. Pupils are equal, round, and reactive to light. Right eye exhibits no discharge. Left eye exhibits no discharge. No scleral icterus.  Neck: Normal range of motion. Neck supple. No JVD present. No tracheal deviation present. No thyromegaly present.  Cardiovascular: Normal rate, regular rhythm, normal heart sounds and intact distal pulses.  Exam reveals no gallop and no friction rub.   No murmur heard. Pulmonary/Chest: Breath sounds normal. No respiratory distress. He has no wheezes. He has no rales.  Abdominal: Soft. Bowel sounds are normal. He exhibits no mass. There is no hepatosplenomegaly. There is no tenderness. There is no rebound, no guarding and no CVA tenderness.  Musculoskeletal: Normal range of motion. He exhibits no edema or tenderness.  Lymphadenopathy:    He has no cervical adenopathy.  Neurological: He is alert and oriented to person, place, and time. He has normal sensation, normal strength and intact cranial nerves. No cranial nerve deficit.  Skin: Skin is warm. No rash noted.  Psychiatric: Mood and affect normal.  Nursing note and vitals reviewed.     Assessment & Plan  Problem List Items Addressed This Visit      Other   B12 deficiency anemia   Relevant Medications   cyanocobalamin ((VITAMIN B-12)) injection 1,000 mcg (Completed)    Other Visit Diagnoses    Acute non-recurrent maxillary sinusitis    -  Primary   Relevant Medications   amoxicillin-clavulanate (AUGMENTIN) 875-125 MG tablet   guaiFENesin-codeine (ROBITUSSIN AC) 100-10 MG/5ML syrup   Bronchitis       Relevant Medications   amoxicillin-clavulanate (AUGMENTIN) 875-125 MG tablet   guaiFENesin-codeine (ROBITUSSIN AC) 100-10 MG/5ML syrup        Dr. [facility]  10/05/16

## 2016-10-08 DIAGNOSIS — E1165 Type 2 diabetes mellitus with hyperglycemia: Secondary | ICD-10-CM | POA: Diagnosis not present

## 2016-10-08 DIAGNOSIS — Z794 Long term (current) use of insulin: Secondary | ICD-10-CM | POA: Diagnosis not present

## 2016-10-11 DIAGNOSIS — L03032 Cellulitis of left toe: Secondary | ICD-10-CM | POA: Diagnosis not present

## 2016-10-11 DIAGNOSIS — E1142 Type 2 diabetes mellitus with diabetic polyneuropathy: Secondary | ICD-10-CM | POA: Diagnosis not present

## 2016-10-20 ENCOUNTER — Ambulatory Visit (INDEPENDENT_AMBULATORY_CARE_PROVIDER_SITE_OTHER): Payer: Medicare Other

## 2016-10-20 DIAGNOSIS — E538 Deficiency of other specified B group vitamins: Secondary | ICD-10-CM | POA: Diagnosis not present

## 2016-10-20 MED ORDER — CYANOCOBALAMIN 1000 MCG/ML IJ SOLN
1000.0000 ug | Freq: Once | INTRAMUSCULAR | Status: AC
  Administered 2016-10-20: 1000 ug via INTRAMUSCULAR

## 2016-10-23 ENCOUNTER — Other Ambulatory Visit: Payer: Self-pay | Admitting: Family Medicine

## 2016-10-23 DIAGNOSIS — E785 Hyperlipidemia, unspecified: Secondary | ICD-10-CM

## 2016-10-27 DIAGNOSIS — R609 Edema, unspecified: Secondary | ICD-10-CM | POA: Diagnosis not present

## 2016-10-27 DIAGNOSIS — N183 Chronic kidney disease, stage 3 (moderate): Secondary | ICD-10-CM | POA: Diagnosis not present

## 2016-10-27 DIAGNOSIS — E1129 Type 2 diabetes mellitus with other diabetic kidney complication: Secondary | ICD-10-CM | POA: Diagnosis not present

## 2016-11-02 ENCOUNTER — Ambulatory Visit (INDEPENDENT_AMBULATORY_CARE_PROVIDER_SITE_OTHER): Payer: Medicare Other

## 2016-11-02 ENCOUNTER — Other Ambulatory Visit: Payer: Self-pay | Admitting: *Deleted

## 2016-11-02 DIAGNOSIS — E538 Deficiency of other specified B group vitamins: Secondary | ICD-10-CM

## 2016-11-02 MED ORDER — CYANOCOBALAMIN 1000 MCG/ML IJ SOLN
1000.0000 ug | Freq: Once | INTRAMUSCULAR | Status: AC
  Administered 2016-11-02: 1000 ug via INTRAMUSCULAR

## 2016-11-02 MED ORDER — INTERFERON BETA-1B 0.3 MG ~~LOC~~ KIT
PACK | SUBCUTANEOUS | 6 refills | Status: DC
Start: 2016-11-02 — End: 2017-09-20

## 2016-11-02 NOTE — Telephone Encounter (Signed)
Pt last seen 08/2016 continue on betaseron.  Prescription escribed.

## 2016-11-04 DIAGNOSIS — D631 Anemia in chronic kidney disease: Secondary | ICD-10-CM | POA: Diagnosis not present

## 2016-11-04 DIAGNOSIS — N2581 Secondary hyperparathyroidism of renal origin: Secondary | ICD-10-CM | POA: Diagnosis not present

## 2016-11-04 DIAGNOSIS — N183 Chronic kidney disease, stage 3 (moderate): Secondary | ICD-10-CM | POA: Diagnosis not present

## 2016-11-04 DIAGNOSIS — E1122 Type 2 diabetes mellitus with diabetic chronic kidney disease: Secondary | ICD-10-CM | POA: Diagnosis not present

## 2016-11-04 DIAGNOSIS — I129 Hypertensive chronic kidney disease with stage 1 through stage 4 chronic kidney disease, or unspecified chronic kidney disease: Secondary | ICD-10-CM | POA: Diagnosis not present

## 2016-11-16 ENCOUNTER — Ambulatory Visit (INDEPENDENT_AMBULATORY_CARE_PROVIDER_SITE_OTHER): Payer: Medicare Other

## 2016-11-16 DIAGNOSIS — E538 Deficiency of other specified B group vitamins: Secondary | ICD-10-CM

## 2016-11-16 MED ORDER — CYANOCOBALAMIN 1000 MCG/ML IJ SOLN
1000.0000 ug | Freq: Once | INTRAMUSCULAR | Status: AC
  Administered 2016-11-16: 1000 ug via INTRAMUSCULAR

## 2016-11-30 ENCOUNTER — Ambulatory Visit (INDEPENDENT_AMBULATORY_CARE_PROVIDER_SITE_OTHER): Payer: Medicare Other

## 2016-11-30 DIAGNOSIS — E538 Deficiency of other specified B group vitamins: Secondary | ICD-10-CM

## 2016-11-30 MED ORDER — CYANOCOBALAMIN 1000 MCG/ML IJ SOLN
1000.0000 ug | Freq: Once | INTRAMUSCULAR | Status: AC
  Administered 2016-11-30: 1000 ug via INTRAMUSCULAR

## 2016-12-14 ENCOUNTER — Encounter: Payer: Self-pay | Admitting: Family Medicine

## 2016-12-14 ENCOUNTER — Ambulatory Visit (INDEPENDENT_AMBULATORY_CARE_PROVIDER_SITE_OTHER): Payer: Medicare Other | Admitting: Family Medicine

## 2016-12-14 ENCOUNTER — Ambulatory Visit: Payer: Medicare Other

## 2016-12-14 VITALS — BP 100/62 | HR 72 | Temp 98.2°F | Ht 70.0 in | Wt 241.0 lb

## 2016-12-14 DIAGNOSIS — J029 Acute pharyngitis, unspecified: Secondary | ICD-10-CM

## 2016-12-14 DIAGNOSIS — E538 Deficiency of other specified B group vitamins: Secondary | ICD-10-CM | POA: Insufficient documentation

## 2016-12-14 DIAGNOSIS — J0140 Acute pansinusitis, unspecified: Secondary | ICD-10-CM

## 2016-12-14 DIAGNOSIS — G35 Multiple sclerosis: Secondary | ICD-10-CM | POA: Diagnosis not present

## 2016-12-14 DIAGNOSIS — J219 Acute bronchiolitis, unspecified: Secondary | ICD-10-CM | POA: Diagnosis not present

## 2016-12-14 MED ORDER — CYANOCOBALAMIN 1000 MCG/ML IJ SOLN
1000.0000 ug | Freq: Once | INTRAMUSCULAR | Status: AC
  Administered 2016-12-14: 1000 ug via INTRAMUSCULAR

## 2016-12-14 MED ORDER — GUAIFENESIN-CODEINE 100-10 MG/5ML PO SYRP: 5.0000 mL | ORAL_SOLUTION | Freq: Three times a day (TID) | ORAL | 0 refills | Status: DC | PRN

## 2016-12-14 MED ORDER — AMOXICILLIN-POT CLAVULANATE 875-125 MG PO TABS: 1.0000 | ORAL_TABLET | Freq: Two times a day (BID) | ORAL | 0 refills | Status: DC

## 2016-12-14 NOTE — Progress Notes (Signed)
Name: Jeremy [name]   MRN: [id number]    DOB: 1951-05-20   Date:12/14/2016       Progress Note  Subjective  Chief Complaint  Chief Complaint  Patient presents with  . Sinusitis    head pressure, cough and cong with green/yellow/ brown production. "Eyes are sore"- Augmentin works    Sinusitis  This is a new problem. The current episode started in the past 7 days. The problem has been gradually worsening since onset. There has been no fever. His pain is at a severity of 3/10. The pain is moderate. Associated symptoms include chills, congestion, coughing, headaches, a hoarse voice, sinus pressure, sneezing and a sore throat. Pertinent negatives include no diaphoresis, ear pain, neck pain, shortness of breath or swollen glands. Past treatments include acetaminophen. The treatment provided mild relief.  Sore Throat   This is a new problem. The current episode started in the past 7 days. The problem has been gradually worsening. The pain is mild. Associated symptoms include congestion, coughing, headaches and a hoarse voice. Pertinent negatives include no abdominal pain, diarrhea, ear discharge, ear pain, neck pain, shortness of breath, stridor or swollen glands. He has had no exposure to strep.    No problem-specific Assessment & Plan notes found for this encounter.   Past Medical History:  Diagnosis Date  . Chronic pain   . Depression   . Diabetes ([place])   . GERD (gastroesophageal reflux disease)   . Hyperlipemia   . Hypertension   . MS (multiple sclerosis) ([place])     Past Surgical History:  Procedure Laterality Date  . COLECTOMY  06-2008  . COLONOSCOPY  2015   normal    Family History  Problem Relation Age of Onset  . Cancer Mother   . Heart attack Father     Social History   Social History  . Marital status: Married    Spouse name: [name]  . Number of children: 2  . Years of education: GED   Occupational History  .      Disabled   Social History Main Topics  .  Smoking status: Former Smoker    Quit date: 01/23/2006  . Smokeless tobacco: Never Used  . Alcohol use 0.0 oz/week     Comment: rare  . Drug use: No  . Sexual activity: Not Currently   Other Topics Concern  . Not on file   Social History Narrative   Patient is disabled.    Patient lives with his wife Authur Cubit.    Patient has 2 children.           Allergies  Allergen Reactions  . Betadine [Povidone Iodine]     Outpatient Medications Prior to Visit  Medication Sig Dispense Refill  . aspirin 81 MG tablet Take 81 mg by mouth daily.    . Cyanocobalamin (VITAMIN B-12 IJ) Inject as directed. Every other week.    . docusate sodium (COLACE) 50 MG capsule Take 50 mg by mouth 2 (two) times daily. otc    . ferrous sulfate 325 (65 FE) MG tablet TAKE 1 TABLET BY MOUTH THREE TIMES DAILY 270 tablet 0  . furosemide (LASIX) 40 MG tablet Take 1 tablet (40 mg total) by mouth 2 (two) times daily. 180 tablet 1  . gemfibrozil (LOPID) 600 MG tablet Take 1 tablet (600 mg total) by mouth 2 (two) times daily before a meal. 180 tablet 1  . glipiZIDE (GLUCOTROL) 5 MG tablet TAKE 1 TABLET(5 MG) BY MOUTH TWICE DAILY  BEFORE A MEAL (Patient taking differently: TAKE 1 TABLET(5 MG) BY MOUTH every am- Dr Maretta Bees) 180 tablet 0  . Insulin Glargine (LANTUS) 100 UNIT/ML Solostar Pen Inject 40 Units into the skin daily. Dr Maretta Bees    . Interferon Beta-1b (BETASERON) 0.3 MG KIT injection Inject subcutaneously 1  syringe every other day 42 each 6  . LYRICA 200 MG capsule TAKE ONE CAPSULE BY MOUTH TWICE DAILY 60 capsule 0  . metFORMIN (GLUCOPHAGE) 500 MG tablet Take 1 tablet by mouth 2 (two) times daily. Dr Maretta Bees    . mupirocin ointment (BACTROBAN) 2 % Apply 1 application topically 2 (two) times daily. PRN    . omeprazole (PRILOSEC) 40 MG capsule TAKE 1 CAPSULE(40 MG) BY MOUTH DAILY 90 capsule 0  . sertraline (ZOLOFT) 50 MG tablet Take 0.5 tablets (25 mg total) by mouth daily. 45 tablet 1  . simvastatin (ZOCOR) 40 MG tablet  TAKE 1 TABLET(40 MG) BY MOUTH DAILY 90 tablet 1  . traMADol (ULTRAM) 50 MG tablet TAKE 1 TABLET BY MOUTH EVERY 6 HOURS AS NEEDED FOR PAIN 120 tablet 5  . gemfibrozil (LOPID) 600 MG tablet TAKE 1 TABLET(600 MG) BY MOUTH TWICE DAILY BEFORE A MEAL 180 tablet 0  . guaiFENesin-codeine (ROBITUSSIN AC) 100-10 MG/5ML syrup Take 5 mLs by mouth 3 (three) times daily as needed for cough. 150 mL 0  . amoxicillin-clavulanate (AUGMENTIN) 875-125 MG tablet Take 1 tablet by mouth 2 (two) times daily. 20 tablet 0   No facility-administered medications prior to visit.     Review of Systems  Constitutional: Positive for chills. Negative for diaphoresis, fever, malaise/fatigue and weight loss.  HENT: Positive for congestion, hoarse voice, sinus pressure, sneezing and sore throat. Negative for ear discharge and ear pain.   Eyes: Negative for blurred vision.  Respiratory: Positive for cough. Negative for sputum production, shortness of breath, wheezing and stridor.   Cardiovascular: Negative for chest pain, palpitations and leg swelling.  Gastrointestinal: Negative for abdominal pain, blood in stool, constipation, diarrhea, heartburn, melena and nausea.  Genitourinary: Negative for dysuria, frequency, hematuria and urgency.  Musculoskeletal: Negative for back pain, joint pain, myalgias and neck pain.  Skin: Negative for rash.  Neurological: Positive for headaches. Negative for dizziness, tingling, sensory change and focal weakness.  Endo/Heme/Allergies: Negative for environmental allergies and polydipsia. Does not bruise/bleed easily.  Psychiatric/Behavioral: Negative for depression and suicidal ideas. The patient is not nervous/anxious and does not have insomnia.      Objective  Vitals:   12/14/16 0909  BP: 100/62  Pulse: 72  Temp: 98.2 F (36.8 C)  TempSrc: Oral  Weight: 241 lb (109.3 kg)  Height: 5' 10"  (1.778 m)    Physical Exam  Constitutional: He is oriented to person, place, and time and  well-developed, well-nourished, and in no distress.  HENT:  Head: Normocephalic.  Right Ear: External ear normal.  Left Ear: External ear normal.  Nose: Mucosal edema present. Right sinus exhibits maxillary sinus tenderness and frontal sinus tenderness. Left sinus exhibits maxillary sinus tenderness and frontal sinus tenderness.  Mouth/Throat: Posterior oropharyngeal edema present. No posterior oropharyngeal erythema.  Eyes: Conjunctivae and EOM are normal. Pupils are equal, round, and reactive to light. Right eye exhibits no discharge. Left eye exhibits no discharge. No scleral icterus.  Neck: Normal range of motion. Neck supple. No JVD present. No tracheal deviation present. No thyromegaly present.  Cardiovascular: Normal rate, regular rhythm, normal heart sounds and intact distal pulses.  Exam reveals no gallop and no friction  rub.   No murmur heard. Pulmonary/Chest: No respiratory distress. He has wheezes. He has no rales.  Abdominal: Soft. Bowel sounds are normal. He exhibits no mass. There is no hepatosplenomegaly. There is no tenderness. There is no rebound, no guarding and no CVA tenderness.  Musculoskeletal: Normal range of motion. He exhibits no edema or tenderness.  Lymphadenopathy:    He has no cervical adenopathy.  Neurological: He is alert and oriented to person, place, and time. He has normal sensation, normal strength, normal reflexes and intact cranial nerves. No cranial nerve deficit.  Skin: Skin is warm. No rash noted.  Psychiatric: Mood and affect normal.  Nursing note and vitals reviewed.     Assessment & Plan  Problem List Items Addressed This Visit      Nervous and Auditory   Multiple sclerosis ([place])     Other   B12 deficiency   Relevant Medications   cyanocobalamin ((VITAMIN B-12)) injection 1,000 mcg (Completed)    Other Visit Diagnoses    Acute non-recurrent pansinusitis    -  Primary   Relevant Medications   amoxicillin-clavulanate (AUGMENTIN) 875-125  MG tablet   guaiFENesin-codeine (ROBITUSSIN AC) 100-10 MG/5ML syrup   Pharyngitis, unspecified etiology       Relevant Medications   amoxicillin-clavulanate (AUGMENTIN) 875-125 MG tablet   Bronchiolitis       Relevant Medications   amoxicillin-clavulanate (AUGMENTIN) 875-125 MG tablet   guaiFENesin-codeine (ROBITUSSIN AC) 100-10 MG/5ML syrup      Meds ordered this encounter  Medications  . amoxicillin-clavulanate (AUGMENTIN) 875-125 MG tablet    Sig: Take 1 tablet by mouth 2 (two) times daily.    Dispense:  20 tablet    Refill:  0  . guaiFENesin-codeine (ROBITUSSIN AC) 100-10 MG/5ML syrup    Sig: Take 5 mLs by mouth 3 (three) times daily as needed for cough.    Dispense:  150 mL    Refill:  0  . cyanocobalamin ((VITAMIN B-12)) injection 1,000 mcg      Dr. Otilio Miu [facility]  12/14/16

## 2016-12-28 ENCOUNTER — Ambulatory Visit (INDEPENDENT_AMBULATORY_CARE_PROVIDER_SITE_OTHER): Payer: Medicare Other

## 2016-12-28 DIAGNOSIS — E538 Deficiency of other specified B group vitamins: Secondary | ICD-10-CM

## 2016-12-28 MED ORDER — CYANOCOBALAMIN 1000 MCG/ML IJ SOLN
1000.0000 ug | Freq: Once | INTRAMUSCULAR | Status: AC
  Administered 2016-12-28: 1000 ug via INTRAMUSCULAR

## 2016-12-30 ENCOUNTER — Other Ambulatory Visit: Payer: Self-pay | Admitting: Family Medicine

## 2016-12-30 DIAGNOSIS — E349 Endocrine disorder, unspecified: Secondary | ICD-10-CM

## 2016-12-30 DIAGNOSIS — G63 Polyneuropathy in diseases classified elsewhere: Principal | ICD-10-CM

## 2016-12-31 DIAGNOSIS — Z794 Long term (current) use of insulin: Secondary | ICD-10-CM | POA: Diagnosis not present

## 2016-12-31 DIAGNOSIS — E1165 Type 2 diabetes mellitus with hyperglycemia: Secondary | ICD-10-CM | POA: Diagnosis not present

## 2017-01-03 ENCOUNTER — Other Ambulatory Visit: Payer: Self-pay

## 2017-01-07 DIAGNOSIS — Z794 Long term (current) use of insulin: Secondary | ICD-10-CM | POA: Diagnosis not present

## 2017-01-07 DIAGNOSIS — E11649 Type 2 diabetes mellitus with hypoglycemia without coma: Secondary | ICD-10-CM | POA: Diagnosis not present

## 2017-01-07 DIAGNOSIS — Z79899 Other long term (current) drug therapy: Secondary | ICD-10-CM | POA: Diagnosis not present

## 2017-01-11 ENCOUNTER — Ambulatory Visit (INDEPENDENT_AMBULATORY_CARE_PROVIDER_SITE_OTHER): Payer: Medicare Other

## 2017-01-11 DIAGNOSIS — E538 Deficiency of other specified B group vitamins: Secondary | ICD-10-CM | POA: Diagnosis not present

## 2017-01-11 MED ORDER — CYANOCOBALAMIN 1000 MCG/ML IJ SOLN
1000.0000 ug | Freq: Once | INTRAMUSCULAR | Status: AC
  Administered 2017-01-11: 1000 ug via INTRAMUSCULAR

## 2017-01-13 ENCOUNTER — Other Ambulatory Visit: Payer: Self-pay | Admitting: Family Medicine

## 2017-01-13 DIAGNOSIS — F329 Major depressive disorder, single episode, unspecified: Secondary | ICD-10-CM

## 2017-01-13 DIAGNOSIS — F32A Depression, unspecified: Secondary | ICD-10-CM

## 2017-01-25 ENCOUNTER — Ambulatory Visit (INDEPENDENT_AMBULATORY_CARE_PROVIDER_SITE_OTHER): Payer: Medicare Other

## 2017-01-25 DIAGNOSIS — D518 Other vitamin B12 deficiency anemias: Secondary | ICD-10-CM

## 2017-01-25 MED ORDER — CYANOCOBALAMIN 1000 MCG/ML IJ SOLN
1000.0000 ug | Freq: Once | INTRAMUSCULAR | Status: AC
  Administered 2017-01-25: 1000 ug via INTRAMUSCULAR

## 2017-01-31 ENCOUNTER — Other Ambulatory Visit: Payer: Self-pay | Admitting: Neurology

## 2017-02-08 ENCOUNTER — Ambulatory Visit (INDEPENDENT_AMBULATORY_CARE_PROVIDER_SITE_OTHER): Payer: Medicare Other

## 2017-02-08 DIAGNOSIS — E538 Deficiency of other specified B group vitamins: Secondary | ICD-10-CM | POA: Diagnosis not present

## 2017-02-08 MED ORDER — CYANOCOBALAMIN 1000 MCG/ML IJ SOLN
1000.0000 ug | Freq: Once | INTRAMUSCULAR | Status: AC
  Administered 2017-02-08: 1000 ug via INTRAMUSCULAR

## 2017-02-22 ENCOUNTER — Ambulatory Visit (INDEPENDENT_AMBULATORY_CARE_PROVIDER_SITE_OTHER): Payer: Medicare Other

## 2017-02-22 DIAGNOSIS — D519 Vitamin B12 deficiency anemia, unspecified: Secondary | ICD-10-CM

## 2017-02-22 MED ORDER — CYANOCOBALAMIN 1000 MCG/ML IJ SOLN
1000.0000 ug | Freq: Once | INTRAMUSCULAR | Status: AC
  Administered 2017-02-22: 1000 ug via INTRAMUSCULAR

## 2017-03-01 ENCOUNTER — Other Ambulatory Visit: Payer: Self-pay | Admitting: Family Medicine

## 2017-03-01 DIAGNOSIS — E349 Endocrine disorder, unspecified: Secondary | ICD-10-CM

## 2017-03-01 DIAGNOSIS — G63 Polyneuropathy in diseases classified elsewhere: Principal | ICD-10-CM

## 2017-03-04 ENCOUNTER — Other Ambulatory Visit: Payer: Self-pay | Admitting: Family Medicine

## 2017-03-04 DIAGNOSIS — E349 Endocrine disorder, unspecified: Secondary | ICD-10-CM

## 2017-03-04 DIAGNOSIS — G63 Polyneuropathy in diseases classified elsewhere: Principal | ICD-10-CM

## 2017-03-08 ENCOUNTER — Other Ambulatory Visit (INDEPENDENT_AMBULATORY_CARE_PROVIDER_SITE_OTHER): Payer: Medicare Other

## 2017-03-08 DIAGNOSIS — D519 Vitamin B12 deficiency anemia, unspecified: Secondary | ICD-10-CM | POA: Diagnosis not present

## 2017-03-08 MED ORDER — CYANOCOBALAMIN 1000 MCG/ML IJ SOLN
1000.0000 ug | Freq: Once | INTRAMUSCULAR | Status: AC
  Administered 2017-03-08: 1000 ug via INTRAMUSCULAR

## 2017-03-11 DIAGNOSIS — H40003 Preglaucoma, unspecified, bilateral: Secondary | ICD-10-CM | POA: Diagnosis not present

## 2017-03-15 ENCOUNTER — Ambulatory Visit (INDEPENDENT_AMBULATORY_CARE_PROVIDER_SITE_OTHER): Payer: Medicare Other | Admitting: Nurse Practitioner

## 2017-03-15 ENCOUNTER — Encounter: Payer: Self-pay | Admitting: Nurse Practitioner

## 2017-03-15 VITALS — BP 132/70 | HR 81 | Ht 70.0 in | Wt 241.2 lb

## 2017-03-15 DIAGNOSIS — G35 Multiple sclerosis: Secondary | ICD-10-CM | POA: Diagnosis not present

## 2017-03-15 DIAGNOSIS — M519 Unspecified thoracic, thoracolumbar and lumbosacral intervertebral disc disorder: Secondary | ICD-10-CM

## 2017-03-15 DIAGNOSIS — E782 Mixed hyperlipidemia: Secondary | ICD-10-CM

## 2017-03-15 DIAGNOSIS — Z5181 Encounter for therapeutic drug level monitoring: Secondary | ICD-10-CM

## 2017-03-15 DIAGNOSIS — R269 Unspecified abnormalities of gait and mobility: Secondary | ICD-10-CM | POA: Diagnosis not present

## 2017-03-15 DIAGNOSIS — G35D Multiple sclerosis, unspecified: Secondary | ICD-10-CM

## 2017-03-15 MED ORDER — TRAMADOL HCL 50 MG PO TABS: 50.0000 mg | ORAL_TABLET | Freq: Four times a day (QID) | ORAL | 3 refills | Status: DC | PRN

## 2017-03-15 NOTE — Patient Instructions (Signed)
Continue Betaseron injection every other day,  Continue Lyrica 200 mg twice a day refilled by Dr. Ronnald Ramp Tramadol as needed for chronic pain,RX to patietn Use cane at all times, patient has significant gait abnormality along with chronic low back pain F/U in 6 months next with Dr. Krista Blue

## 2017-03-15 NOTE — Progress Notes (Signed)
[facility]  PATIENT: Jeremy [name] DOB: 1951-03-04   REASON FOR VISIT: Follow-up for multiple sclerosis HISTORY FROM: Patient    HISTORY OF PRESENT ILLNESS:HISTORY:He has PMHx of diabetes, hypertension and multiple sclerosis. The diagnosis of multiple sclerosis was confirmed by abnormal MRI brain and cervical, and a spinal fluid testing. He was initially followed by Dr. Estella Husk in 2008. Last seen in this office Oct 2014, He continues to have gait difficulties and relies on a cane for ambulation. He has had no problems with his vision or bowel/bladder control.  He has had occasional falls.He has ongoing pain in his legs. Last MS flare was over 2 years ago. He is currently on Betaseron every other day, treatment started in 2007.   MRI cervical in 2013 with findings consistent with MS plaques and no enhancement,mild spinal stenosis.  MRI of the brain with prominent white matter changes consistent with MS. No active areas of demyelination. He continues to have problems with low   The initial symptom was acute onset of left leg more than arm weakness, gait difficulty in 2006. This eventually led to the diagnosis of relapsing remitting multiple sclerosis. He has been receiving Betaseron treatment since early 2007. He has tolerated the medication well. He has had no problems with lipoatrophy or skin necrosis. In September 2009, he developed GI bleeding. Workup had demonstrated colon inflammation and obstruction.He underwent colectomy and recovered well from that procedure.  He continues to have gait difficulties and relies on a cane for ambulation. He has had no problems with his vision or bowel/bladder control. He does have chronic extremity pain, which has been managed with Lyrica and Tramadol.   He uses an Transport planner for situations in which he would need to walk long distances. He has had occasional falls.He has ongoing pain in his legs  UPDATE  07/2014: He was admitted to hospital in Nov 2014 for anemia, low blood count, was evaluated by hematologist in [facility], now has recovered, he still has moderate gait difficulty.also complains bilateral lower extremity spasticity, is taking Lyrica, there was no clinical flareups, last MRI was in 2013, there was significant lesions in brain, and cervical spine  UPDATE 09/03/2014: He has tried Baclofen 54m qhs, complains of excessive sleepiness, could not tolerate it, he still mows the yard, the most limitation is his gait difficulty,he denies bowel and bladder incontinence.  We have reviewed MRI togather, MRI scan of brain showing periventricular and subcortical white matter hyperintensities consistent with multiple sclerosis. Presence of atrophy of corpus callosum indicates chronic disease. No significant change compared with MRI 06/02/2012   MRI cervical spine showing mild spondylitic changes descibed above most prominent at C 3-4 where there is moderate left formaminal narrowing. Ill defined spinal cord hypertintensities at C 3-4 to c 6-7 likely represent chronic multiple sclerosis inactive plaques. No significant change compared with MRI C spine 06/02/2012  UPDATE 11//17/16 [name] 66year old male returns for follow-up. He was last seen in this office 03/06/15.  He was diagnosed with multiple sclerosis in 2006. He is currently on Betaseron every other day without injection site issues.. He still remains fairly active mowing the yard, denies any bowel or bladder incontinence. He denies any sensory changes, double vision speech or swallowing difficulty. He ambulates with a cane. He has had a couple of falls since last seen. He returns for reevaluation  UPDATE May 17th 2017:YY He is with his wife at today's clinical visit, he continued to receive Betaseron every other day without  significant side effect, he has significant gait difficulty, left side is weaker, complains of chronic fatigue, chronic low  back pain, bilateral lower extremity neuropathic pain, taking Lyrica, tramadol as needed, Reviewed laboratory evaluation, A1c 7.9, CMP showed elevated creatinine 1.4, normal CBC We again personally reviewed MRI of the scan without contrast, most recent was in 2015, MRI of the brain showed multiple supratentorium lesions, no contrast enhancement, MRI of cervical spine showed ill-defined C3-C7 cord lesion, no contrast enhancement, no significant change compared to previous scans UPDATE 11/20/2017CM Mr. Ransome, 66 year old male returns for follow-up with his wife is a history of multiple sclerosis and has been off of his Betaseron for approximately a week. He is trying to get patient assistance. Most recent MRI of the brain showed multiple supratentorium lesions no contrast enhancement. MRI of cervical spine showing ill-defined C3-C7 cord lesion that contrast enhancement, no significant changes when compared to previous scans. He continues to complain with some fatigue. He ambulates with a cane he denies any recent falls. He remains on Lyrica and tramadol with benefit. He returns for reevaluation UPDATE 03/15/17 CM Mr. Deery, 66 year old male returns for follow-up with diagnosis of multiple sclerosis gait abnormality. He continues to fall mostly in his backyard. He ambulates with single-point cane. He has not had any injury He is on Betaseron every other day and denies injection site problems. His wife gives him his injection. He continues to complain with fatigue. He has vitamin B12 deficiency shots every 2 weeks he remains on Lyrica and tramadol for his pain. Most recent hemoglobin A1c 5.5 MRI of the brain and cervical spine in 2015 without significant change when compared to previous lumbar spine MRI in March 2017 with degenerative disc disease at L5-S1 and to a lesser degree at L4-L5.  REVIEW OF SYSTEMS: Full 14 system review of systems performed and notable only for those listed, all others are neg:    Constitutional: Fatigue  Cardiovascular: Swelling in the legs Ear/Nose/Throat: neg  Skin: neg Eyes: neg Respiratory: neg Gastroitestinal: neg  Hematology/Lymphatic: Anemia Endocrine: neg Musculoskeletal: Joint pain back pain walking difficulty Allergy/Immunology: neg Neurological: Weakness Psychiatric: neg Sleep : neg   ALLERGIES: Allergies  Allergen Reactions  . Betadine [Povidone Iodine]     HOME MEDICATIONS: Outpatient Medications Prior to Visit  Medication Sig Dispense Refill  . aspirin 81 MG tablet Take 81 mg by mouth daily.    Marland Kitchen docusate sodium (COLACE) 50 MG capsule Take 50 mg by mouth 2 (two) times daily. otc    . ferrous sulfate 325 (65 FE) MG tablet TAKE 1 TABLET BY MOUTH THREE TIMES DAILY 270 tablet 0  . furosemide (LASIX) 40 MG tablet Take 1 tablet (40 mg total) by mouth 2 (two) times daily. 180 tablet 1  . gemfibrozil (LOPID) 600 MG tablet Take 1 tablet (600 mg total) by mouth 2 (two) times daily before a meal. 180 tablet 1  . Insulin Glargine (LANTUS) 100 UNIT/ML Solostar Pen Inject 40 Units into the skin daily. Dr Maretta Bees    . Interferon Beta-1b (BETASERON) 0.3 MG KIT injection Inject subcutaneously 1  syringe every other day 42 each 6  . LYRICA 200 MG capsule TAKE 1 CAPSULE BY MOUTH TWICE DAILY 60 capsule 0  . metFORMIN (GLUCOPHAGE) 500 MG tablet Take 1 tablet by mouth 2 (two) times daily. Dr Maretta Bees    . mupirocin ointment (BACTROBAN) 2 % Apply 1 application topically 2 (two) times daily. PRN    . omeprazole (PRILOSEC) 40 MG capsule TAKE 1  CAPSULE(40 MG) BY MOUTH DAILY 90 capsule 0  . sertraline (ZOLOFT) 50 MG tablet Take 0.5 tablets (25 mg total) by mouth daily. 45 tablet 1  . simvastatin (ZOCOR) 40 MG tablet TAKE 1 TABLET(40 MG) BY MOUTH DAILY 90 tablet 1  . traMADol (ULTRAM) 50 MG tablet TAKE 1 TABLET BY MOUTH EVERY 6 HOURS AS NEEDED FOR PAIN 120 tablet 0  . amoxicillin-clavulanate (AUGMENTIN) 875-125 MG tablet Take 1 tablet by mouth 2 (two) times daily.  (Patient not taking: Reported on 03/15/2017) 20 tablet 0  . Cyanocobalamin (VITAMIN B-12 IJ) Inject as directed. Every other week.    Marland Kitchen glipiZIDE (GLUCOTROL) 5 MG tablet TAKE 1 TABLET(5 MG) BY MOUTH TWICE DAILY BEFORE A MEAL (Patient not taking: Reported on 03/15/2017) 180 tablet 0  . guaiFENesin-codeine (ROBITUSSIN AC) 100-10 MG/5ML syrup Take 5 mLs by mouth 3 (three) times daily as needed for cough. (Patient not taking: Reported on 03/15/2017) 150 mL 0  . sertraline (ZOLOFT) 50 MG tablet TAKE 1/2 TABLET(25 MG) BY MOUTH DAILY (Patient not taking: Reported on 03/15/2017) 45 tablet 0   No facility-administered medications prior to visit.     PAST MEDICAL HISTORY: Past Medical History:  Diagnosis Date  . Chronic pain   . Depression   . Diabetes ([place])   . GERD (gastroesophageal reflux disease)   . Hyperlipemia   . Hypertension   . MS (multiple sclerosis) ([place])     PAST SURGICAL HISTORY: Past Surgical History:  Procedure Laterality Date  . COLECTOMY  06-2008  . COLONOSCOPY  2015   normal    FAMILY HISTORY: Family History  Problem Relation Age of Onset  . Cancer Mother   . Heart attack Father     SOCIAL HISTORY: Social History   Social History  . Marital status: Married    Spouse name: [name]  . Number of children: 2  . Years of education: GED   Occupational History  .      Disabled   Social History Main Topics  . Smoking status: Former Smoker    Quit date: 01/23/2006  . Smokeless tobacco: Never Used  . Alcohol use 0.0 oz/week     Comment: rare  . Drug use: No  . Sexual activity: Not Currently   Other Topics Concern  . Not on file   Social History Narrative   Patient is disabled.    Patient lives with his wife Alvah Lagrow.    Patient has 2 children.            PHYSICAL EXAM  Vitals:   03/15/17 1308  BP: 132/70  Pulse: 81  Weight: 241 lb 3.2 oz (109.4 kg)  Height: 5' 10"  (1.778 m)   Body mass index is 34.61 kg/m.  Generalized: Well developed, obese  male in no acute distress  Head: normocephalic and atraumatic,. Oropharynx benign  Neck: Supple, no carotid bruits  Cardiac: Regular rate rhythm, no murmur  Musculoskeletal: Mild left hemiparesis  Neurological examination   Mentation: Alert oriented to time, place, history taking. Attention span and concentration appropriate. Recent and remote memory intact.  Follows all commands speech and language fluent.   Cranial nerve II-XII: Fundoscopic exam reveals sharp disc margins.Pupils were equal round reactive to light extraocular movements were full, visual field were full on confrontational test. Facial sensation and strength were normal. hearing was intact to finger rubbing bilaterally. Uvula tongue midline. head turning and shoulder shrug were normal and symmetric.Tongue protrusion into cheek strength was normal. Motor: Mild left-sided weakness  Sensory: normal and symmetric to light touch, pinprick, and  Vibration, in the upper and lower extremities Coordination: finger-nose-finger, heel-to-shin bilaterally, no dysmetria Reflexes: Hyperreflexia of both lower extremities, plantar responses were flexor bilaterally. Gait and Station:Need to push up to get up from seated position wide based, stiff, unsteady left hemi-circumferential gait . Ambulates with single-point cane DIAGNOSTIC DATA (LABS, IMAGING, TESTING) - I reviewed patient records, labs, notes, testing and imaging myself where available.  Lab Results  Component Value Date   WBC 5.3 08/06/2016   HGB 13.3 07/30/2014   HCT 39.9 08/06/2016   MCV 92 08/06/2016   PLT 150 08/06/2016      Component Value Date/Time   NA 138 08/06/2016 0847   NA 143 08/31/2013 0417   K 4.2 08/06/2016 0847   K 3.5 08/31/2013 0417   CL 93 (L) 08/06/2016 0847   CL 109 (H) 08/31/2013 0417   CO2 23 08/06/2016 0847   CO2 28 08/31/2013 0417   GLUCOSE 329 (H) 08/06/2016 0847   GLUCOSE 133 (H) 08/31/2013 0417   BUN 25 08/06/2016 0847   BUN 39 (H)  08/31/2013 0417   CREATININE 1.39 (H) 08/06/2016 0847   CREATININE 1.53 (H) 08/31/2013 0417   CALCIUM 10.1 08/06/2016 0847   CALCIUM 8.9 08/31/2013 0417   PROT 7.3 09/11/2015 1440   PROT 6.2 (L) 08/31/2013 0417   ALBUMIN 4.3 08/06/2016 0847   ALBUMIN 2.9 (L) 08/31/2013 0417   AST 24 09/11/2015 1440   AST 35 08/31/2013 0417   ALT 11 09/11/2015 1440   ALT 15 08/31/2013 0417   ALKPHOS 80 09/11/2015 1440   ALKPHOS 60 08/31/2013 0417   BILITOT 0.3 09/11/2015 1440   BILITOT 0.6 08/31/2013 0417   GFRNONAA 53 (L) 08/06/2016 0847   GFRNONAA 48 (L) 08/31/2013 0417   GFRAA 61 08/06/2016 0847   GFRAA 56 (L) 08/31/2013 0417   Lab Results  Component Value Date   CHOL 165 08/06/2016   HDL 27 (L) 08/06/2016   LDLCALC 75 08/06/2016   TRIG 315 (H) 08/06/2016   CHOLHDL 6.1 (H) 08/06/2016   Lab Results  Component Value Date   HGBA1C 14.3 (H) 08/06/2016    ASSESSMENT AND PLAN  65 y.o. year old male  has a past medical history of Chronic pain; Depression; Diabetes ([place]); GERD (gastroesophageal reflux disease); Hyperlipemia; Hypertension; and MS (multiple sclerosis) ([place]). here To follow-up for his multiple sclerosis    Continue Betaseron injection every other day,  Continue Lyrica 200 mg twice a day refilled by Dr. Ronnald Ramp Tramadol as needed for chronic pain,RX to patient Use cane at all times, patient has significant gait abnormality along with chronic low back pain F/U in 6 months next with Dr. Luan Pulling, [facility], [facility], [facility] [address] [place], [place] [address] [phone]

## 2017-03-16 ENCOUNTER — Telehealth: Payer: Self-pay

## 2017-03-16 LAB — CBC WITH DIFFERENTIAL/PLATELET
BASOS: 2 %
Basophils Absolute: 0.1 10*3/uL (ref 0.0–0.2)
EOS (ABSOLUTE): 0.2 10*3/uL (ref 0.0–0.4)
Eos: 4 %
HEMOGLOBIN: 12.4 g/dL — AB (ref 13.0–17.7)
Hematocrit: 37.1 % — ABNORMAL LOW (ref 37.5–51.0)
IMMATURE GRANS (ABS): 0 10*3/uL (ref 0.0–0.1)
Immature Granulocytes: 0 %
LYMPHS: 26 %
Lymphocytes Absolute: 1.2 10*3/uL (ref 0.7–3.1)
MCH: 31.2 pg (ref 26.6–33.0)
MCHC: 33.4 g/dL (ref 31.5–35.7)
MCV: 94 fL (ref 79–97)
MONOCYTES: 10 %
Monocytes Absolute: 0.5 10*3/uL (ref 0.1–0.9)
Neutrophils Absolute: 2.8 10*3/uL (ref 1.4–7.0)
Neutrophils: 58 %
PLATELETS: 177 10*3/uL (ref 150–379)
RBC: 3.97 x10E6/uL — ABNORMAL LOW (ref 4.14–5.80)
RDW: 14.7 % (ref 12.3–15.4)
WBC: 4.8 10*3/uL (ref 3.4–10.8)

## 2017-03-16 LAB — COMPREHENSIVE METABOLIC PANEL
A/G RATIO: 1.4 (ref 1.2–2.2)
ALT: 13 IU/L (ref 0–44)
AST: 19 IU/L (ref 0–40)
Albumin: 4.2 g/dL (ref 3.6–4.8)
Alkaline Phosphatase: 73 IU/L (ref 39–117)
BUN/Creatinine Ratio: 17 (ref 10–24)
BUN: 26 mg/dL (ref 8–27)
Bilirubin Total: 0.3 mg/dL (ref 0.0–1.2)
CALCIUM: 9.9 mg/dL (ref 8.6–10.2)
CO2: 25 mmol/L (ref 18–29)
Chloride: 99 mmol/L (ref 96–106)
Creatinine, Ser: 1.5 mg/dL — ABNORMAL HIGH (ref 0.76–1.27)
GFR calc Af Amer: 56 mL/min/{1.73_m2} — ABNORMAL LOW (ref >59)
GFR calc non Af Amer: 49 mL/min/{1.73_m2} — ABNORMAL LOW (ref >59)
GLUCOSE: 116 mg/dL — AB (ref 65–99)
Globulin, Total: 3 g/dL (ref 1.5–4.5)
POTASSIUM: 4.2 mmol/L (ref 3.5–5.2)
Sodium: 140 mmol/L (ref 134–144)
TOTAL PROTEIN: 7.2 g/dL (ref 6.0–8.5)

## 2017-03-16 NOTE — Telephone Encounter (Signed)
-----   Message from Dennie Bible, NP sent at 03/16/2017  1:03 PM EDT ----- Labs stable please call patient

## 2017-03-16 NOTE — Telephone Encounter (Signed)
Rn call patient to let him know labs are stable. PT stated he saw them on mychart. Pt verbalized understanding.

## 2017-03-17 ENCOUNTER — Other Ambulatory Visit: Payer: Self-pay | Admitting: Family Medicine

## 2017-03-17 NOTE — Progress Notes (Signed)
I have reviewed and agreed above plan. 

## 2017-03-22 ENCOUNTER — Other Ambulatory Visit (INDEPENDENT_AMBULATORY_CARE_PROVIDER_SITE_OTHER): Payer: Medicare Other

## 2017-03-22 DIAGNOSIS — E538 Deficiency of other specified B group vitamins: Secondary | ICD-10-CM

## 2017-03-22 MED ORDER — CYANOCOBALAMIN 1000 MCG/ML IJ SOLN
1000.0000 ug | Freq: Once | INTRAMUSCULAR | Status: AC
  Administered 2017-03-22: 1000 ug via INTRAMUSCULAR

## 2017-04-04 ENCOUNTER — Other Ambulatory Visit: Payer: Self-pay | Admitting: Family Medicine

## 2017-04-05 ENCOUNTER — Other Ambulatory Visit (INDEPENDENT_AMBULATORY_CARE_PROVIDER_SITE_OTHER): Payer: Medicare Other

## 2017-04-05 DIAGNOSIS — D518 Other vitamin B12 deficiency anemias: Secondary | ICD-10-CM | POA: Diagnosis not present

## 2017-04-05 MED ORDER — CYANOCOBALAMIN 1000 MCG/ML IJ SOLN
1000.0000 ug | Freq: Once | INTRAMUSCULAR | Status: AC
  Administered 2017-04-05: 1000 ug via INTRAMUSCULAR

## 2017-04-15 DIAGNOSIS — E11649 Type 2 diabetes mellitus with hypoglycemia without coma: Secondary | ICD-10-CM | POA: Diagnosis not present

## 2017-04-15 DIAGNOSIS — Z794 Long term (current) use of insulin: Secondary | ICD-10-CM | POA: Diagnosis not present

## 2017-04-19 ENCOUNTER — Ambulatory Visit (INDEPENDENT_AMBULATORY_CARE_PROVIDER_SITE_OTHER): Payer: Medicare Other | Admitting: Family Medicine

## 2017-04-19 DIAGNOSIS — N183 Chronic kidney disease, stage 3 (moderate): Secondary | ICD-10-CM | POA: Diagnosis not present

## 2017-04-19 DIAGNOSIS — E538 Deficiency of other specified B group vitamins: Secondary | ICD-10-CM | POA: Diagnosis not present

## 2017-04-19 DIAGNOSIS — I270 Primary pulmonary hypertension: Secondary | ICD-10-CM | POA: Diagnosis not present

## 2017-04-19 DIAGNOSIS — R609 Edema, unspecified: Secondary | ICD-10-CM | POA: Diagnosis not present

## 2017-04-19 DIAGNOSIS — E1129 Type 2 diabetes mellitus with other diabetic kidney complication: Secondary | ICD-10-CM | POA: Diagnosis not present

## 2017-04-19 MED ORDER — CYANOCOBALAMIN 1000 MCG/ML IJ SOLN
1000.0000 ug | Freq: Once | INTRAMUSCULAR | Status: AC
  Administered 2017-04-19: 1000 ug via INTRAMUSCULAR

## 2017-04-20 DIAGNOSIS — B351 Tinea unguium: Secondary | ICD-10-CM | POA: Diagnosis not present

## 2017-04-20 DIAGNOSIS — E1142 Type 2 diabetes mellitus with diabetic polyneuropathy: Secondary | ICD-10-CM | POA: Diagnosis not present

## 2017-04-20 NOTE — Progress Notes (Signed)
B-12 shot

## 2017-04-26 DIAGNOSIS — R609 Edema, unspecified: Secondary | ICD-10-CM | POA: Diagnosis not present

## 2017-04-26 DIAGNOSIS — I270 Primary pulmonary hypertension: Secondary | ICD-10-CM | POA: Diagnosis not present

## 2017-04-26 DIAGNOSIS — E1129 Type 2 diabetes mellitus with other diabetic kidney complication: Secondary | ICD-10-CM | POA: Diagnosis not present

## 2017-04-26 DIAGNOSIS — N183 Chronic kidney disease, stage 3 (moderate): Secondary | ICD-10-CM | POA: Diagnosis not present

## 2017-05-01 ENCOUNTER — Other Ambulatory Visit: Payer: Self-pay | Admitting: Family Medicine

## 2017-05-03 ENCOUNTER — Ambulatory Visit (INDEPENDENT_AMBULATORY_CARE_PROVIDER_SITE_OTHER): Payer: Medicare Other

## 2017-05-03 DIAGNOSIS — E538 Deficiency of other specified B group vitamins: Secondary | ICD-10-CM | POA: Diagnosis not present

## 2017-05-03 MED ORDER — CYANOCOBALAMIN 1000 MCG/ML IJ SOLN
1000.0000 ug | Freq: Once | INTRAMUSCULAR | Status: AC
  Administered 2017-05-03: 1000 ug via INTRAMUSCULAR

## 2017-05-12 DIAGNOSIS — I270 Primary pulmonary hypertension: Secondary | ICD-10-CM | POA: Diagnosis not present

## 2017-05-12 DIAGNOSIS — N183 Chronic kidney disease, stage 3 (moderate): Secondary | ICD-10-CM | POA: Diagnosis not present

## 2017-05-17 ENCOUNTER — Ambulatory Visit (INDEPENDENT_AMBULATORY_CARE_PROVIDER_SITE_OTHER): Payer: Medicare Other

## 2017-05-17 DIAGNOSIS — E538 Deficiency of other specified B group vitamins: Secondary | ICD-10-CM

## 2017-05-17 MED ORDER — CYANOCOBALAMIN 1000 MCG/ML IJ SOLN
1000.0000 ug | Freq: Once | INTRAMUSCULAR | Status: AC
  Administered 2017-05-17: 1000 ug via INTRAMUSCULAR

## 2017-05-30 ENCOUNTER — Other Ambulatory Visit: Payer: Self-pay | Admitting: Family Medicine

## 2017-05-30 DIAGNOSIS — K219 Gastro-esophageal reflux disease without esophagitis: Secondary | ICD-10-CM

## 2017-05-31 ENCOUNTER — Ambulatory Visit (INDEPENDENT_AMBULATORY_CARE_PROVIDER_SITE_OTHER): Payer: Medicare Other

## 2017-05-31 DIAGNOSIS — D519 Vitamin B12 deficiency anemia, unspecified: Secondary | ICD-10-CM

## 2017-05-31 MED ORDER — CYANOCOBALAMIN 1000 MCG/ML IJ SOLN
1000.0000 ug | Freq: Once | INTRAMUSCULAR | Status: AC
  Administered 2017-05-31: 1000 ug via INTRAMUSCULAR

## 2017-06-14 ENCOUNTER — Ambulatory Visit (INDEPENDENT_AMBULATORY_CARE_PROVIDER_SITE_OTHER): Payer: Medicare Other

## 2017-06-14 DIAGNOSIS — D518 Other vitamin B12 deficiency anemias: Secondary | ICD-10-CM | POA: Diagnosis not present

## 2017-06-14 MED ORDER — CYANOCOBALAMIN 1000 MCG/ML IJ SOLN
1000.0000 ug | Freq: Once | INTRAMUSCULAR | Status: AC
Start: 2017-06-14 — End: 2017-06-14
  Administered 2017-06-14: 1000 ug via INTRAMUSCULAR

## 2017-06-16 ENCOUNTER — Other Ambulatory Visit: Payer: Self-pay | Admitting: Family Medicine

## 2017-06-28 ENCOUNTER — Ambulatory Visit (INDEPENDENT_AMBULATORY_CARE_PROVIDER_SITE_OTHER): Payer: Medicare Other

## 2017-06-28 DIAGNOSIS — E538 Deficiency of other specified B group vitamins: Secondary | ICD-10-CM

## 2017-06-28 MED ORDER — CYANOCOBALAMIN 1000 MCG/ML IJ SOLN
1000.0000 ug | Freq: Once | INTRAMUSCULAR | Status: AC
  Administered 2017-06-28: 1000 ug via INTRAMUSCULAR

## 2017-07-02 ENCOUNTER — Other Ambulatory Visit: Payer: Self-pay | Admitting: Family Medicine

## 2017-07-04 ENCOUNTER — Other Ambulatory Visit: Payer: Self-pay | Admitting: Family Medicine

## 2017-07-04 DIAGNOSIS — E349 Endocrine disorder, unspecified: Secondary | ICD-10-CM

## 2017-07-04 DIAGNOSIS — G63 Polyneuropathy in diseases classified elsewhere: Principal | ICD-10-CM

## 2017-07-12 ENCOUNTER — Ambulatory Visit (INDEPENDENT_AMBULATORY_CARE_PROVIDER_SITE_OTHER): Payer: Medicare Other

## 2017-07-12 DIAGNOSIS — E538 Deficiency of other specified B group vitamins: Secondary | ICD-10-CM | POA: Diagnosis not present

## 2017-07-12 MED ORDER — CYANOCOBALAMIN 1000 MCG/ML IJ SOLN
1000.0000 ug | Freq: Once | INTRAMUSCULAR | Status: AC
  Administered 2017-07-12: 1000 ug via INTRAMUSCULAR

## 2017-07-14 ENCOUNTER — Other Ambulatory Visit: Payer: Self-pay | Admitting: Family Medicine

## 2017-07-14 DIAGNOSIS — E785 Hyperlipidemia, unspecified: Secondary | ICD-10-CM

## 2017-07-26 ENCOUNTER — Ambulatory Visit (INDEPENDENT_AMBULATORY_CARE_PROVIDER_SITE_OTHER): Payer: Medicare Other

## 2017-07-26 DIAGNOSIS — E538 Deficiency of other specified B group vitamins: Secondary | ICD-10-CM | POA: Diagnosis not present

## 2017-07-26 MED ORDER — CYANOCOBALAMIN 1000 MCG/ML IJ SOLN
1000.0000 ug | Freq: Once | INTRAMUSCULAR | Status: AC
  Administered 2017-07-26: 1000 ug via INTRAMUSCULAR

## 2017-07-29 ENCOUNTER — Other Ambulatory Visit: Payer: Self-pay | Admitting: Family Medicine

## 2017-08-02 ENCOUNTER — Other Ambulatory Visit: Payer: Self-pay | Admitting: Family Medicine

## 2017-08-07 ENCOUNTER — Other Ambulatory Visit: Payer: Self-pay | Admitting: Family Medicine

## 2017-08-07 DIAGNOSIS — E349 Endocrine disorder, unspecified: Secondary | ICD-10-CM

## 2017-08-07 DIAGNOSIS — G63 Polyneuropathy in diseases classified elsewhere: Principal | ICD-10-CM

## 2017-08-09 ENCOUNTER — Ambulatory Visit (INDEPENDENT_AMBULATORY_CARE_PROVIDER_SITE_OTHER): Payer: Medicare Other

## 2017-08-09 DIAGNOSIS — D519 Vitamin B12 deficiency anemia, unspecified: Secondary | ICD-10-CM | POA: Diagnosis not present

## 2017-08-09 DIAGNOSIS — Z23 Encounter for immunization: Secondary | ICD-10-CM

## 2017-08-09 MED ORDER — CYANOCOBALAMIN 1000 MCG/ML IJ SOLN
1000.0000 ug | Freq: Once | INTRAMUSCULAR | Status: AC
  Administered 2017-08-09: 1000 ug via INTRAMUSCULAR

## 2017-08-12 DIAGNOSIS — Z794 Long term (current) use of insulin: Secondary | ICD-10-CM | POA: Insufficient documentation

## 2017-08-12 DIAGNOSIS — E785 Hyperlipidemia, unspecified: Secondary | ICD-10-CM

## 2017-08-12 DIAGNOSIS — E1142 Type 2 diabetes mellitus with diabetic polyneuropathy: Secondary | ICD-10-CM | POA: Insufficient documentation

## 2017-08-12 DIAGNOSIS — E1169 Type 2 diabetes mellitus with other specified complication: Secondary | ICD-10-CM | POA: Insufficient documentation

## 2017-08-12 DIAGNOSIS — E11649 Type 2 diabetes mellitus with hypoglycemia without coma: Secondary | ICD-10-CM | POA: Diagnosis not present

## 2017-08-12 LAB — LIPID PANEL
CHOLESTEROL: 154 (ref 0–200)
HDL: 28 — AB (ref 35–70)
LDL CALC: 93
TRIGLYCERIDES: 168 — AB (ref 40–160)

## 2017-08-12 LAB — MICROALBUMIN, URINE: Microalb, Ur: 11.8

## 2017-08-12 LAB — HEMOGLOBIN A1C: HEMOGLOBIN A1C: 5.9

## 2017-08-23 ENCOUNTER — Ambulatory Visit (INDEPENDENT_AMBULATORY_CARE_PROVIDER_SITE_OTHER): Payer: Medicare Other

## 2017-08-23 ENCOUNTER — Other Ambulatory Visit: Payer: Self-pay | Admitting: Nurse Practitioner

## 2017-08-23 DIAGNOSIS — D519 Vitamin B12 deficiency anemia, unspecified: Secondary | ICD-10-CM | POA: Diagnosis not present

## 2017-08-23 MED ORDER — CYANOCOBALAMIN 1000 MCG/ML IJ SOLN
1000.0000 ug | Freq: Once | INTRAMUSCULAR | Status: AC
  Administered 2017-08-23: 1000 ug via INTRAMUSCULAR

## 2017-08-23 NOTE — Telephone Encounter (Signed)
Last appt with Dr Krista Blue 09-19-17.

## 2017-08-24 NOTE — Telephone Encounter (Signed)
Fax confirmation received [facility] [phone] [email or web address] sy

## 2017-08-27 ENCOUNTER — Other Ambulatory Visit: Payer: Self-pay | Admitting: Family Medicine

## 2017-08-27 DIAGNOSIS — K219 Gastro-esophageal reflux disease without esophagitis: Secondary | ICD-10-CM

## 2017-08-29 ENCOUNTER — Other Ambulatory Visit: Payer: Self-pay | Admitting: Family Medicine

## 2017-09-05 ENCOUNTER — Telehealth: Payer: Self-pay | Admitting: Neurology

## 2017-09-05 NOTE — Telephone Encounter (Signed)
Received fax from [employer] requesting papers be completed to allow them to get financial assistance for Betaseron for patient. Also requested other information. Papers completed, signed by Dr Krista Blue Daun Peacock, NP out of office). All requested papers successfully faxed to [employer].

## 2017-09-05 NOTE — Telephone Encounter (Signed)
Her phone number is [phone].

## 2017-09-05 NOTE — Telephone Encounter (Signed)
Attempted to reach [facility] on number listed; attempted twice. Received recording stating I had reached a number that does not take call backs.

## 2017-09-05 NOTE — Telephone Encounter (Signed)
[place] with Beta plus she is waiting on the enrollment form she has not receive that yet.

## 2017-09-06 ENCOUNTER — Ambulatory Visit (INDEPENDENT_AMBULATORY_CARE_PROVIDER_SITE_OTHER): Payer: Medicare Other

## 2017-09-06 DIAGNOSIS — D519 Vitamin B12 deficiency anemia, unspecified: Secondary | ICD-10-CM | POA: Diagnosis not present

## 2017-09-06 MED ORDER — CYANOCOBALAMIN 1000 MCG/ML IJ SOLN
1000.0000 ug | Freq: Once | INTRAMUSCULAR | Status: AC
  Administered 2018-11-14: 1000 ug via INTRAMUSCULAR

## 2017-09-07 ENCOUNTER — Other Ambulatory Visit: Payer: Self-pay | Admitting: Family Medicine

## 2017-09-07 DIAGNOSIS — E349 Endocrine disorder, unspecified: Secondary | ICD-10-CM

## 2017-09-07 DIAGNOSIS — G63 Polyneuropathy in diseases classified elsewhere: Principal | ICD-10-CM

## 2017-09-09 DIAGNOSIS — H40003 Preglaucoma, unspecified, bilateral: Secondary | ICD-10-CM | POA: Diagnosis not present

## 2017-09-13 ENCOUNTER — Telehealth: Payer: Self-pay

## 2017-09-13 NOTE — Telephone Encounter (Signed)
Sent possessing

## 2017-09-13 NOTE — Telephone Encounter (Signed)
Pt's wife calling to speak with [place] reg PAP for Interferon Beta-1b (BETASERON) 0.3 MG KIT injection

## 2017-09-19 ENCOUNTER — Ambulatory Visit: Payer: Medicare Other | Admitting: Neurology

## 2017-09-20 ENCOUNTER — Ambulatory Visit (INDEPENDENT_AMBULATORY_CARE_PROVIDER_SITE_OTHER): Payer: Medicare Other

## 2017-09-20 ENCOUNTER — Telehealth: Payer: Self-pay | Admitting: Nurse Practitioner

## 2017-09-20 ENCOUNTER — Telehealth: Payer: Self-pay | Admitting: *Deleted

## 2017-09-20 DIAGNOSIS — D519 Vitamin B12 deficiency anemia, unspecified: Secondary | ICD-10-CM

## 2017-09-20 MED ORDER — CYANOCOBALAMIN 1000 MCG/ML IJ SOLN
1000.0000 ug | Freq: Once | INTRAMUSCULAR | Status: AC
  Administered 2017-09-20: 1000 ug via INTRAMUSCULAR

## 2017-09-20 MED ORDER — INTERFERON BETA-1B 0.3 MG ~~LOC~~ KIT: PACK | SUBCUTANEOUS | 6 refills | Status: DC

## 2017-09-20 NOTE — Telephone Encounter (Signed)
Prescription given to Dr. Krista Blue, and Rance Muir in pt assistance to fax.

## 2017-09-20 NOTE — Telephone Encounter (Signed)
See other note

## 2017-09-20 NOTE — Telephone Encounter (Signed)
Need hard copy prescription for pt assistance Betaseron (BAYER).  Prescription to Dr. Krista Blue for signature.

## 2017-09-20 NOTE — Telephone Encounter (Signed)
RX has been faxed and application.

## 2017-09-20 NOTE — Telephone Encounter (Signed)
Marcello Moores with [place] [employer] is calling to get a new Rx for Interferon Beta-1b (BETASERON) 0.3 MG KIT injection. Please call to [phone].

## 2017-09-22 ENCOUNTER — Encounter: Payer: Self-pay | Admitting: Neurology

## 2017-09-22 ENCOUNTER — Ambulatory Visit (INDEPENDENT_AMBULATORY_CARE_PROVIDER_SITE_OTHER): Payer: Medicare Other | Admitting: Neurology

## 2017-09-22 VITALS — BP 143/76 | HR 76 | Ht 70.0 in | Wt 236.5 lb

## 2017-09-22 DIAGNOSIS — R269 Unspecified abnormalities of gait and mobility: Secondary | ICD-10-CM | POA: Diagnosis not present

## 2017-09-22 NOTE — Progress Notes (Signed)
[facility]  PATIENT: Jeremy [name] DOB: 01/22/1951   REASON FOR VISIT: Follow-up for multiple sclerosis HISTORY FROM: Patient    HISTORY OF PRESENT ILLNESS:HISTORY:He has PMHx of diabetes, hypertension and multiple sclerosis. The diagnosis of multiple sclerosis was confirmed by abnormal MRI brain and cervical, and a spinal fluid testing. He was initially followed by Dr. Estella Husk in 2008. Last seen in this office Oct 2014, He continues to have gait difficulties and relies on a cane for ambulation. He has had no problems with his vision or bowel/bladder control.  He has had occasional falls.He has ongoing pain in his legs. Last MS flare was over 2 years ago. He is currently on Betaseron every other day, treatment started in 2007.   MRI cervical in 2013 with findings consistent with MS plaques and no enhancement,mild spinal stenosis.  MRI of the brain with prominent white matter changes consistent with MS. No active areas of demyelination. He continues to have problems with low   The initial symptom was acute onset of left leg more than arm weakness, gait difficulty in 2006. This eventually led to the diagnosis of relapsing remitting multiple sclerosis. He has been receiving Betaseron treatment since early 2007. He has tolerated the medication well. He has had no problems with lipoatrophy or skin necrosis. In September 2009, he developed GI bleeding. Workup had demonstrated colon inflammation and obstruction.He underwent colectomy and recovered well from that procedure.  He continues to have gait difficulties and relies on a cane for ambulation. He has had no problems with his vision or bowel/bladder control. He does have chronic extremity pain, which has been managed with Lyrica and Tramadol.   He uses an Transport planner for situations in which he would need to walk long distances. He has had occasional falls.He has ongoing pain in his legs  UPDATE  07/2014: He was admitted to hospital in Nov 2014 for anemia, low blood count, was evaluated by hematologist in [facility], now has recovered, he still has moderate gait difficulty.also complains bilateral lower extremity spasticity, is taking Lyrica, there was no clinical flareups, last MRI was in 2013, there was significant lesions in brain, and cervical spine  UPDATE 09/03/2014: He has tried Baclofen 9m qhs, complains of excessive sleepiness, could not tolerate it, he still mows the yard, the most limitation is his gait difficulty,he denies bowel and bladder incontinence.  We have reviewed MRI togather, MRI scan of brain showing periventricular and subcortical white matter hyperintensities consistent with multiple sclerosis. Presence of atrophy of corpus callosum indicates chronic disease. No significant change compared with MRI 06/02/2012   MRI cervical spine showing mild spondylitic changes descibed above most prominent at C 3-4 where there is moderate left formaminal narrowing. Ill defined spinal cord hypertintensities at C 3-4 to c 6-7 likely represent chronic multiple sclerosis inactive plaques. No significant change compared with MRI C spine 8/9/201  UPDATE May 17th 2017 He is with his wife at today's clinical visit, he continued to receive Betaseron every other day without significant side effect, he has significant gait difficulty, left side is weaker, complains of chronic fatigue, chronic low back pain, bilateral lower extremity neuropathic pain, taking Lyrica, tramadol as needed, Reviewed laboratory evaluation, A1c 7.9, CMP showed elevated creatinine 1.4, normal CBC We again personally reviewed MRI of the scan without contrast, most recent was in 2015, MRI of the brain showed multiple supratentorium lesions, no contrast enhancement, MRI of cervical spine showed ill-defined C3-C7 cord lesion, no contrast enhancement, no significant change  compared to previous scans  Update September 22, 2017: Creatinine 1.27 in July 2018, he is overall doing very well, mild gait abnormality, use cane, still active at home, providing normal, no bowel bladder incontinence, diabetes, diabetic peripheral neuropathy, using Betaseron, last MRI was in 2015,   REVIEW OF SYSTEMS: Full 14 system review of systems performed and notable only for those listed, all others are neg:     ALLERGIES: Allergies  Allergen Reactions  . Betadine [Povidone Iodine]     HOME MEDICATIONS: Outpatient Medications Prior to Visit  Medication Sig Dispense Refill  . aspirin 81 MG tablet Take 81 mg by mouth daily.    . cyanocobalamin (,VITAMIN B-12,) 1000 MCG/ML injection Inject into the muscle.    . docusate sodium (COLACE) 50 MG capsule Take 50 mg by mouth daily. otc     . ferrous sulfate 325 (65 FE) MG tablet TAKE 1 TABLET BY MOUTH THREE TIMES DAILY 270 tablet 0  . furosemide (LASIX) 40 MG tablet TAKE 1 TABLET BY MOUTH TWICE DAILY 180 tablet 0  . gemfibrozil (LOPID) 600 MG tablet Take 1 tablet (600 mg total) by mouth 2 (two) times daily before a meal. 180 tablet 1  . Insulin Glargine (LANTUS) 100 UNIT/ML Solostar Pen Inject 40 Units into the skin daily. Dr Maretta Bees    . Interferon Beta-1b (BETASERON) 0.3 MG KIT injection Inject subcutaneously 1  syringe every other day 42 each 6  . loratadine (CLARITIN) 10 MG tablet Take 10 mg by mouth daily.    Marland Kitchen LYRICA 200 MG capsule TAKE 1 CAPSULE BY MOUTH TWICE DAILY 60 capsule 0  . metFORMIN (GLUCOPHAGE) 500 MG tablet Take by mouth.    . mupirocin ointment (BACTROBAN) 2 % Apply 1 application topically 2 (two) times daily. PRN    . omeprazole (PRILOSEC) 40 MG capsule TAKE 1 CAPSULE(40 MG) BY MOUTH DAILY 90 capsule 0  . sertraline (ZOLOFT) 50 MG tablet Take 0.5 tablets (25 mg total) by mouth daily. 45 tablet 1  . simvastatin (ZOCOR) 40 MG tablet TAKE 1 TABLET BY MOUTH EVERY DAY 90 tablet 0  . traMADol (ULTRAM) 50 MG tablet TAKE 1 TABLET BY MOUTH EVERY 6 HOURS AS NEEDED FOR PAIN 120  tablet 0  . Loratadine (CLARITIN) 10 MG CAPS Take by mouth.    Marland Kitchen gemfibrozil (LOPID) 600 MG tablet TAKE 1 TABLET(600 MG) BY MOUTH TWICE DAILY BEFORE A MEAL 180 tablet 0  . omeprazole (PRILOSEC) 40 MG capsule TAKE 1 CAPSULE(40 MG) BY MOUTH DAILY 90 capsule 0  . omeprazole (PRILOSEC) 40 MG capsule Take by mouth.    Marland Kitchen omeprazole (PRILOSEC) 40 MG capsule TAKE 1 CAPSULE(40 MG) BY MOUTH DAILY 90 capsule 0  . pregabalin (LYRICA) 200 MG capsule Take by mouth.    . simvastatin (ZOCOR) 40 MG tablet TAKE 1 TABLET BY MOUTH EVERY DAY 30 tablet 1   Facility-Administered Medications Prior to Visit  Medication Dose Route Frequency Provider Last Rate Last Dose  . cyanocobalamin ((VITAMIN B-12)) injection 1,000 mcg  1,000 mcg Intramuscular Once Juline Patch, MD        PAST MEDICAL HISTORY: Past Medical History:  Diagnosis Date  . Chronic pain   . Depression   . Diabetes ([place])   . GERD (gastroesophageal reflux disease)   . Hyperlipemia   . Hypertension   . MS (multiple sclerosis) ([place])     PAST SURGICAL HISTORY: Past Surgical History:  Procedure Laterality Date  . COLECTOMY  06-2008  . COLONOSCOPY  2015   normal    FAMILY HISTORY: Family History  Problem Relation Age of Onset  . Cancer Mother   . Heart attack Father     SOCIAL HISTORY: Social History   Socioeconomic History  . Marital status: Married    Spouse name: [name]  . Number of children: 2  . Years of education: GED  . Highest education level: Not on file  Social Needs  . Financial resource strain: Not on file  . Food insecurity - worry: Not on file  . Food insecurity - inability: Not on file  . Transportation needs - medical: Not on file  . Transportation needs - non-medical: Not on file  Occupational History    Comment: Disabled  Tobacco Use  . Smoking status: Former Smoker    Last attempt to quit: 01/23/2006    Years since quitting: 11.6  . Smokeless tobacco: Never Used  Substance and Sexual Activity  . Alcohol  use: Yes    Alcohol/week: 0.0 oz    Comment: rare  . Drug use: No  . Sexual activity: Not Currently  Other Topics Concern  . Not on file  Social History Narrative   Patient is disabled.    Patient lives with his wife Hall Birchard.    Patient has 2 children.         PHYSICAL EXAM  Vitals:   09/22/17 1258  BP: (!) 143/76  Pulse: 76  Weight: 236 lb 8 oz (107.3 kg)  Height: _0  (1.778 m)   Body mass index is 33.93 kg/m.  Generalized: Well developed, obese male in no acute distress  Head: normocephalic and atraumatic,. Oropharynx benign  Neck: Supple, no carotid bruits  Cardiac: Regular rate rhythm, no murmur  Musculoskeletal: Mild left hemiparesis  Neurological examination   Mentation: Alert oriented to time, place, history taking. Attention span and concentration appropriate. Recent and remote memory intact.  Follows all commands speech and language fluent.   Cranial nerve II-XII: Fundoscopic exam reveals sharp disc margins.Pupils were equal round reactive to light extraocular movements were full, visual field were full on confrontational test. Facial sensation and strength were normal. hearing was intact to finger rubbing bilaterally. Uvula tongue midline. head turning and shoulder shrug were normal and symmetric.Tongue protrusion into cheek strength was normal. Motor: Mild left-sided weakness Sensory: Less dependent decreased light touch pinprick vibratory sensation to mid shin Coordination: finger-nose-finger, heel-to-shin bilaterally, no dysmetria Reflexes: Hyperreflexia of both lower extremities, plantar responses were flexor bilaterally. Gait and Station:Need to push up to get up from seated position wide based, stiff, unsteady left hemi-circumferential gait . Ambulates with single-point cane DIAGNOSTIC DATA (LABS, IMAGING, TESTING) - I reviewed patient records, labs, notes, testing and imaging myself where available.  Lab Results  Component Value Date   WBC 4.8  03/15/2017   HGB 12.4 (L) 03/15/2017   HCT 37.1 (L) 03/15/2017   MCV 94 03/15/2017   PLT 177 03/15/2017      Component Value Date/Time   NA 140 03/15/2017 1349   NA 143 08/31/2013 0417   K 4.2 03/15/2017 1349   K 3.5 08/31/2013 0417   CL 99 03/15/2017 1349   CL 109 (H) 08/31/2013 0417   CO2 25 03/15/2017 1349   CO2 28 08/31/2013 0417   GLUCOSE 116 (H) 03/15/2017 1349   GLUCOSE 133 (H) 08/31/2013 0417   BUN 26 03/15/2017 1349   BUN 39 (H) 08/31/2013 0417   CREATININE 1.50 (H) 03/15/2017 1349   CREATININE 1.53 (H) 08/31/2013  0417   CALCIUM 9.9 03/15/2017 1349   CALCIUM 8.9 08/31/2013 0417   PROT 7.2 03/15/2017 1349   PROT 6.2 (L) 08/31/2013 0417   ALBUMIN 4.2 03/15/2017 1349   ALBUMIN 2.9 (L) 08/31/2013 0417   AST 19 03/15/2017 1349   AST 35 08/31/2013 0417   ALT 13 03/15/2017 1349   ALT 15 08/31/2013 0417   ALKPHOS 73 03/15/2017 1349   ALKPHOS 60 08/31/2013 0417   BILITOT 0.3 03/15/2017 1349   BILITOT 0.6 08/31/2013 0417   GFRNONAA 49 (L) 03/15/2017 1349   GFRNONAA 48 (L) 08/31/2013 0417   GFRAA 56 (L) 03/15/2017 1349   GFRAA 56 (L) 08/31/2013 0417   Lab Results  Component Value Date   CHOL 165 08/06/2016   HDL 27 (L) 08/06/2016   LDLCALC 75 08/06/2016   TRIG 315 (H) 08/06/2016   CHOLHDL 6.1 (H) 08/06/2016   Lab Results  Component Value Date   HGBA1C 14.3 (H) 08/06/2016    ASSESSMENT AND PLAN  66 y.o. year old male  Relapsing remitting multiple sclerosis Progressive worsening gait abnormality  Multifactorial, multiple sclerosis, diabetic peripheral neuropathy, deconditioning,,  Repeat MRI of the brain without contrast, normal creatinine 1.27, not give contrast this time  Return to clinic in 6 months with Rhae Hammock, M.D. Ph.D.  [facility] [place],  [address] Phone: [phone] Fax:      [phone]

## 2017-09-22 NOTE — Telephone Encounter (Signed)
Spoke to patient and his wife when they came in today to see Dr. Krista Blue and Patient will get Betaseron On Friday 09/23/2017.

## 2017-09-23 DIAGNOSIS — E119 Type 2 diabetes mellitus without complications: Secondary | ICD-10-CM | POA: Diagnosis not present

## 2017-09-26 ENCOUNTER — Encounter: Payer: Self-pay | Admitting: Internal Medicine

## 2017-09-26 ENCOUNTER — Ambulatory Visit (INDEPENDENT_AMBULATORY_CARE_PROVIDER_SITE_OTHER): Payer: Medicare Other | Admitting: Internal Medicine

## 2017-09-26 VITALS — BP 138/74 | HR 69 | Temp 98.5°F | Ht 70.0 in | Wt 240.0 lb

## 2017-09-26 DIAGNOSIS — J0100 Acute maxillary sinusitis, unspecified: Secondary | ICD-10-CM

## 2017-09-26 DIAGNOSIS — I493 Ventricular premature depolarization: Secondary | ICD-10-CM

## 2017-09-26 DIAGNOSIS — I499 Cardiac arrhythmia, unspecified: Secondary | ICD-10-CM

## 2017-09-26 DIAGNOSIS — F330 Major depressive disorder, recurrent, mild: Secondary | ICD-10-CM | POA: Insufficient documentation

## 2017-09-26 MED ORDER — AMOXICILLIN-POT CLAVULANATE 875-125 MG PO TABS: 1.0000 | ORAL_TABLET | Freq: Two times a day (BID) | ORAL | 0 refills | Status: DC

## 2017-09-26 MED ORDER — GUAIFENESIN-CODEINE 100-10 MG/5ML PO SYRP: 5.0000 mL | ORAL_SOLUTION | Freq: Three times a day (TID) | ORAL | 0 refills | Status: DC | PRN

## 2017-09-26 NOTE — Progress Notes (Signed)
Date:  09/26/2017   Name:  Jeremy [name]   DOB:  [birth year]   MRN:  [id number]   Chief Complaint: Cough (Started on Friday - bad cough- yellow/green production. Laying down at night cases him to choke on mucous. )  Sinusitis  This is a new problem. The current episode started in the past 7 days. The problem has been gradually worsening since onset. There has been no fever. The pain is mild. Associated symptoms include congestion, coughing, headaches and sinus pressure. Pertinent negatives include no chills, diaphoresis, shortness of breath or sore throat. Past treatments include nothing.     Review of Systems  Constitutional: Negative for chills, diaphoresis and fever.  HENT: Positive for congestion, sinus pressure and sinus pain. Negative for sore throat and trouble swallowing.   Eyes: Negative for visual disturbance.  Respiratory: Positive for cough. Negative for chest tightness and shortness of breath.   Cardiovascular: Negative for chest pain and palpitations.  Gastrointestinal: Negative for abdominal pain.  Neurological: Positive for headaches.    Patient Active Problem List   Diagnosis Date Noted  . B12 deficiency 12/14/2016  . Low back pain 03/10/2016  . Lumbosacral disc disease 01/01/2016  . 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 B12 deficiency anemia 06/03/2015  . Essential hypertension 06/03/2015  . Hyperlipidemia 06/03/2015  . Depression 06/03/2015  . Gastroesophageal reflux disease without esophagitis 06/03/2015  . Edema extremities 06/03/2015  . Multiple sclerosis ([place]) 07/26/2013  . Abnormality of gait 07/26/2013  . Morbid obesity ([place]) 07/26/2013    Prior to Admission medications   Medication Sig Start Date End Date Taking? Authorizing Provider  aspirin 81 MG tablet Take 81 mg by mouth daily.   Yes [provider]  cyanocobalamin (,VITAMIN B-12,) 1000 MCG/ML injection Inject into the muscle.   Yes [provider]   docusate sodium (COLACE) 50 MG capsule Take 50 mg by mouth daily. otc    Yes [provider]  ferrous sulfate 325 (65 FE) MG tablet TAKE 1 TABLET BY MOUTH THREE TIMES DAILY 06/17/17  Yes Juline Patch, MD  furosemide (LASIX) 40 MG tablet TAKE 1 TABLET BY MOUTH TWICE DAILY 07/29/17  Yes Juline Patch, MD  gemfibrozil (LOPID) 600 MG tablet Take 1 tablet (600 mg total) by mouth 2 (two) times daily before a meal. 08/06/16  Yes Juline Patch, MD  Insulin Glargine (LANTUS) 100 UNIT/ML Solostar Pen Inject 36 Units into the skin daily. Dr Maretta Bees 09/06/16  Yes [provider]  Interferon Beta-1b (BETASERON) 0.3 MG KIT injection Inject subcutaneously 1  syringe every other day 09/20/17  Yes Marcial Pacas, MD  loratadine (CLARITIN) 10 MG tablet Take 10 mg by mouth daily.   Yes [provider]  LYRICA 200 MG capsule TAKE 1 CAPSULE BY MOUTH TWICE DAILY 03/03/17  Yes Juline Patch, MD  metFORMIN (GLUCOPHAGE) 500 MG tablet Take by mouth. 09/08/17  Yes [provider]  mupirocin ointment (BACTROBAN) 2 % Apply 1 application topically 2 (two) times daily. PRN   Yes [provider]  omeprazole (PRILOSEC) 40 MG capsule TAKE 1 CAPSULE(40 MG) BY MOUTH DAILY 08/29/17  Yes Juline Patch, MD  sertraline (ZOLOFT) 50 MG tablet Take 0.5 tablets (25 mg total) by mouth daily. 08/06/16  Yes Juline Patch, MD  simvastatin (ZOCOR) 40 MG tablet TAKE 1 TABLET BY MOUTH EVERY DAY 07/04/17  Yes Juline Patch, MD  traMADol (ULTRAM) 50 MG tablet TAKE 1 TABLET BY MOUTH  EVERY 6 HOURS AS NEEDED FOR PAIN 08/23/17  Yes Dennie Bible, NP    Allergies  Allergen Reactions  . Betadine [Povidone Iodine]     Past Surgical History:  Procedure Laterality Date  . COLECTOMY  06-2008  . COLONOSCOPY  2015   normal    Social History   Tobacco Use  . Smoking status: Former Smoker    Last attempt to quit: 01/23/2006    Years since quitting: 11.6  . Smokeless tobacco: Never Used    Substance Use Topics  . Alcohol use: Yes    Alcohol/week: 0.0 oz    Comment: rare  . Drug use: No     Medication list has been reviewed and updated.  PHQ 2/9 Scores 01/01/2016 12/03/2015 11/19/2015 10/23/2015  PHQ - 2 Score 0 0 0 0    Physical Exam  Constitutional: He is oriented to person, place, and time. He appears well-developed and well-nourished.  HENT:  Right Ear: External ear and ear canal normal. Tympanic membrane is not erythematous and not retracted.  Left Ear: External ear and ear canal normal. Tympanic membrane is not erythematous and not retracted.  Nose: Right sinus exhibits maxillary sinus tenderness and frontal sinus tenderness. Left sinus exhibits maxillary sinus tenderness and frontal sinus tenderness.  Mouth/Throat: Uvula is midline and mucous membranes are normal. No oral lesions. No oropharyngeal exudate or posterior oropharyngeal erythema.  Cardiovascular: Normal rate and normal heart sounds. An irregularly irregular rhythm present. Exam reveals no gallop.  No murmur heard. Pulmonary/Chest: Breath sounds normal. He has no wheezes. He has no rales.  Lymphadenopathy:    He has no cervical adenopathy.  Neurological: He is alert and oriented to person, place, and time.    BP 138/74   Pulse 69   Temp 98.5 F (36.9 C) (Oral)   Ht 5' 10" (1.778 m)   Wt 240 lb (108.9 kg)   SpO2 93%   BMI 34.44 kg/m   Assessment and Plan: 1. Acute non-recurrent maxillary sinusitis Continue fluids - amoxicillin-clavulanate (AUGMENTIN) 875-125 MG tablet; Take 1 tablet by mouth 2 (two) times daily.  Dispense: 20 tablet; Refill: 0 - guaiFENesin-codeine (ROBITUSSIN AC) 100-10 MG/5ML syrup; Take 5 mLs by mouth 3 (three) times daily as needed for cough.  Dispense: 150 mL; Refill: 0  2. Cardiac arrhythmia, unspecified cardiac arrhythmia type Frequent PVC on EKG - pt reassured - EKG 12-Lead   Meds ordered this encounter  Medications  . amoxicillin-clavulanate (AUGMENTIN) 875-125  MG tablet    Sig: Take 1 tablet by mouth 2 (two) times daily.    Dispense:  20 tablet    Refill:  0  . guaiFENesin-codeine (ROBITUSSIN AC) 100-10 MG/5ML syrup    Sig: Take 5 mLs by mouth 3 (three) times daily as needed for cough.    Dispense:  150 mL    Refill:  0    Partially dictated using Editor, commissioning. Any errors are unintentional.  Halina Maidens, MD [facility]  09/26/2017

## 2017-09-29 ENCOUNTER — Ambulatory Visit (INDEPENDENT_AMBULATORY_CARE_PROVIDER_SITE_OTHER): Payer: Medicare Other

## 2017-09-29 ENCOUNTER — Encounter: Payer: Self-pay | Admitting: Family Medicine

## 2017-09-29 ENCOUNTER — Ambulatory Visit (INDEPENDENT_AMBULATORY_CARE_PROVIDER_SITE_OTHER): Payer: Medicare Other | Admitting: Family Medicine

## 2017-09-29 ENCOUNTER — Other Ambulatory Visit: Payer: Self-pay

## 2017-09-29 VITALS — BP 130/70 | HR 64 | Ht 70.0 in | Wt 239.0 lb

## 2017-09-29 VITALS — BP 126/78 | HR 74 | Temp 97.8°F | Resp 20 | Ht 70.0 in | Wt 239.2 lb

## 2017-09-29 DIAGNOSIS — G63 Polyneuropathy in diseases classified elsewhere: Secondary | ICD-10-CM

## 2017-09-29 DIAGNOSIS — I10 Essential (primary) hypertension: Secondary | ICD-10-CM | POA: Diagnosis not present

## 2017-09-29 DIAGNOSIS — Z114 Encounter for screening for human immunodeficiency virus [HIV]: Secondary | ICD-10-CM

## 2017-09-29 DIAGNOSIS — R69 Illness, unspecified: Secondary | ICD-10-CM | POA: Diagnosis not present

## 2017-09-29 DIAGNOSIS — Z1159 Encounter for screening for other viral diseases: Secondary | ICD-10-CM

## 2017-09-29 DIAGNOSIS — K219 Gastro-esophageal reflux disease without esophagitis: Secondary | ICD-10-CM

## 2017-09-29 DIAGNOSIS — F331 Major depressive disorder, recurrent, moderate: Secondary | ICD-10-CM

## 2017-09-29 DIAGNOSIS — E782 Mixed hyperlipidemia: Secondary | ICD-10-CM | POA: Diagnosis not present

## 2017-09-29 DIAGNOSIS — G609 Hereditary and idiopathic neuropathy, unspecified: Secondary | ICD-10-CM | POA: Diagnosis not present

## 2017-09-29 DIAGNOSIS — Z23 Encounter for immunization: Secondary | ICD-10-CM

## 2017-09-29 DIAGNOSIS — Z0000 Encounter for general adult medical examination without abnormal findings: Secondary | ICD-10-CM | POA: Diagnosis not present

## 2017-09-29 DIAGNOSIS — R601 Generalized edema: Secondary | ICD-10-CM | POA: Diagnosis not present

## 2017-09-29 DIAGNOSIS — E349 Endocrine disorder, unspecified: Secondary | ICD-10-CM | POA: Diagnosis not present

## 2017-09-29 MED ORDER — PREGABALIN 200 MG PO CAPS: 200.0000 mg | ORAL_CAPSULE | Freq: Two times a day (BID) | ORAL | 6 refills | Status: DC

## 2017-09-29 MED ORDER — FUROSEMIDE 40 MG PO TABS: 40.0000 mg | ORAL_TABLET | Freq: Two times a day (BID) | ORAL | 1 refills | Status: DC

## 2017-09-29 MED ORDER — GEMFIBROZIL 600 MG PO TABS: 600.0000 mg | ORAL_TABLET | Freq: Two times a day (BID) | ORAL | 1 refills | Status: DC

## 2017-09-29 MED ORDER — OMEPRAZOLE 40 MG PO CPDR: DELAYED_RELEASE_CAPSULE | ORAL | 1 refills | Status: DC

## 2017-09-29 MED ORDER — SIMVASTATIN 40 MG PO TABS: 40.0000 mg | ORAL_TABLET | Freq: Every day | ORAL | 1 refills | Status: DC

## 2017-09-29 MED ORDER — SERTRALINE HCL 50 MG PO TABS: 25.0000 mg | ORAL_TABLET | Freq: Every day | ORAL | 1 refills | Status: DC

## 2017-09-29 NOTE — Progress Notes (Signed)
Name: Jeremy [name]   MRN: [id number]    DOB: 26-Jan-1951   Date:09/29/2017       Progress Note  Subjective  Chief Complaint  Chief Complaint  Patient presents with  . Hypertension  . Depression  . Peripheral Neuropathy  . Gastroesophageal Reflux  . Hyperlipidemia  . Leg Swelling    takes furosemide    Hypertension  This is a chronic problem. The current episode started more than 1 month ago. The problem is unchanged. The problem is controlled. Pertinent negatives include no anxiety, blurred vision, chest pain, headaches, malaise/fatigue, neck pain, orthopnea, palpitations, peripheral edema, PND, shortness of breath or sweats. There are no associated agents to hypertension. Risk factors for coronary artery disease include dyslipidemia and obesity. Past treatments include diuretics. The current treatment provides moderate improvement. There are no compliance problems.  There is no history of angina, kidney disease, CAD/MI, CVA, heart failure, left ventricular hypertrophy, PVD or retinopathy. There is no history of chronic renal disease, a hypertension causing med or renovascular disease.  Depression       The patient presents with depression.(Neuropathy)  This is a chronic problem.  The current episode started more than 1 year ago.   The problem occurs intermittently.  The problem has been waxing and waning since onset.  Associated symptoms include no decreased concentration, no fatigue, no helplessness, no hopelessness, does not have insomnia, not irritable, no restlessness, no decreased interest, no appetite change, no body aches, no myalgias, no headaches, no indigestion, not sad and no suicidal ideas.     The symptoms are aggravated by nothing.  Past treatments include SSRIs - Selective serotonin reuptake inhibitors.  Compliance with treatment is good.  Past medical history includes depression.     Pertinent negatives include no hypothyroidism and no anxiety. Gastroesophageal Reflux  He  complains of heartburn. He reports no abdominal pain, no chest pain, no coughing, no nausea, no sore throat or no wheezing. neuropathy. This is a chronic problem. The current episode started more than 1 year ago. The problem occurs frequently. The problem has been waxing and waning. The symptoms are aggravated by certain foods. Pertinent negatives include no anemia, fatigue, melena, muscle weakness, orthopnea or weight loss. He has tried a PPI for the symptoms. The treatment provided moderate relief.  Hyperlipidemia  This is a chronic problem. The problem is controlled. Exacerbating diseases include diabetes and obesity. He has no history of chronic renal disease, hypothyroidism, liver disease or nephrotic syndrome. Exacerbated by: furosemode. Associated symptoms include a focal sensory loss. Pertinent negatives include no chest pain, focal weakness, myalgias or shortness of breath. Current antihyperlipidemic treatment includes diet change and statins. The current treatment provides moderate improvement of lipids.  Neurologic Problem  The patient's primary symptoms include clumsiness, focal sensory loss and a loss of balance. The patient's pertinent negatives include no altered mental status, focal weakness or slurred speech. Primary symptoms comment: neuropathy. This is a chronic problem. The current episode started more than 1 month ago. The problem has been waxing and waning since onset. There was no focality noted. Pertinent negatives include no abdominal pain, back pain, chest pain, dizziness, fatigue, fever, headaches, nausea, neck pain, palpitations or shortness of breath. Treatments tried: lyrica. The treatment provided moderate relief. There is no history of liver disease.    No problem-specific Assessment & Plan notes found for this encounter.   Past Medical History:  Diagnosis Date  . Chronic pain   . Depression   . Diabetes ([place])   .  GERD (gastroesophageal reflux disease)   . Hyperlipemia    . Hypertension   . MS (multiple sclerosis) ([place])     Past Surgical History:  Procedure Laterality Date  . COLECTOMY  06-2008  . COLONOSCOPY  2015   normal    Family History  Problem Relation Age of Onset  . Cancer Mother   . Heart attack Father   . Heart attack Brother   . COPD Brother     Social History   Socioeconomic History  . Marital status: Married    Spouse name: [name]  . Number of children: 2  . Years of education: GED  . Highest education level: Not on file  Social Needs  . Financial resource strain: Not hard at all  . Food insecurity - worry: Never true  . Food insecurity - inability: Never true  . Transportation needs - medical: No  . Transportation needs - non-medical: No  Occupational History    Comment: Disabled  Tobacco Use  . Smoking status: Former Smoker    Packs/day: 1.50    Years: 30.00    Pack years: 45.00    Types: Cigarettes    Last attempt to quit: 01/23/2006    Years since quitting: 11.6  . Smokeless tobacco: Never Used  . Tobacco comment: N/A  Substance and Sexual Activity  . Alcohol use: Yes    Alcohol/week: 0.0 oz    Comment: rare; maybe 2 beers a year  . Drug use: No  . Sexual activity: Not Currently  Other Topics Concern  . Not on file  Social History Narrative   Patient is disabled.    Patient lives with his wife Ezekiel Menzer.    Patient has 2 children.        Allergies  Allergen Reactions  . Betadine [Povidone Iodine]     Outpatient Medications Prior to Visit  Medication Sig Dispense Refill  . amoxicillin-clavulanate (AUGMENTIN) 875-125 MG tablet Take 1 tablet by mouth 2 (two) times daily. 20 tablet 0  . aspirin 81 MG tablet Take 81 mg by mouth daily.    . cyanocobalamin (,VITAMIN B-12,) 1000 MCG/ML injection Inject into the muscle.    . docusate sodium (COLACE) 50 MG capsule Take 50 mg by mouth daily. otc     . ferrous sulfate 325 (65 FE) MG tablet TAKE 1 TABLET BY MOUTH THREE TIMES DAILY 270 tablet 0  .  guaiFENesin-codeine (ROBITUSSIN AC) 100-10 MG/5ML syrup Take 5 mLs by mouth 3 (three) times daily as needed for cough. 150 mL 0  . Insulin Glargine (LANTUS) 100 UNIT/ML Solostar Pen Inject 36 Units into the skin daily. Dr Honor Junes    . Interferon Beta-1b (BETASERON) 0.3 MG KIT injection Inject subcutaneously 1  syringe every other day 42 each 6  . loratadine (CLARITIN) 10 MG tablet Take 10 mg by mouth daily.    . metFORMIN (GLUCOPHAGE) 500 MG tablet Take 500 mg by mouth daily with breakfast. Dr Honor Junes    . mupirocin ointment (BACTROBAN) 2 % Apply 1 application topically 2 (two) times daily. PRN    . traMADol (ULTRAM) 50 MG tablet TAKE 1 TABLET BY MOUTH EVERY 6 HOURS AS NEEDED FOR PAIN 120 tablet 0  . furosemide (LASIX) 40 MG tablet TAKE 1 TABLET BY MOUTH TWICE DAILY 180 tablet 0  . gemfibrozil (LOPID) 600 MG tablet Take 1 tablet (600 mg total) by mouth 2 (two) times daily before a meal. 180 tablet 1  . LYRICA 200 MG capsule TAKE 1 CAPSULE  BY MOUTH TWICE DAILY 60 capsule 0  . omeprazole (PRILOSEC) 40 MG capsule TAKE 1 CAPSULE(40 MG) BY MOUTH DAILY 90 capsule 0  . sertraline (ZOLOFT) 50 MG tablet Take 0.5 tablets (25 mg total) by mouth daily. 45 tablet 1  . simvastatin (ZOCOR) 40 MG tablet TAKE 1 TABLET BY MOUTH EVERY DAY 90 tablet 0   Facility-Administered Medications Prior to Visit  Medication Dose Route Frequency Provider Last Rate Last Dose  . cyanocobalamin ((VITAMIN B-12)) injection 1,000 mcg  1,000 mcg Intramuscular Once Juline Patch, MD        Review of Systems  Constitutional: Negative for appetite change, chills, fatigue, fever, malaise/fatigue and weight loss.  HENT: Negative for ear discharge, ear pain and sore throat.   Eyes: Negative for blurred vision.  Respiratory: Negative for cough, sputum production, shortness of breath and wheezing.   Cardiovascular: Negative for chest pain, palpitations, orthopnea, leg swelling and PND.  Gastrointestinal: Positive for heartburn.  Negative for abdominal pain, blood in stool, constipation, diarrhea, melena and nausea.  Genitourinary: Negative for dysuria, frequency, hematuria and urgency.  Musculoskeletal: Negative for back pain, joint pain, myalgias, muscle weakness and neck pain.  Skin: Negative for rash.  Neurological: Positive for loss of balance. Negative for dizziness, tingling, sensory change, focal weakness and headaches.  Endo/Heme/Allergies: Negative for environmental allergies and polydipsia. Does not bruise/bleed easily.  Psychiatric/Behavioral: Positive for depression. Negative for decreased concentration and suicidal ideas. The patient is not nervous/anxious and does not have insomnia.      Objective  Vitals:   09/29/17 0921  BP: 130/70  Pulse: 64  Weight: 239 lb (108.4 kg)  Height: 5' 10"  (1.778 m)    Physical Exam  Constitutional: He is oriented to person, place, and time and well-developed, well-nourished, and in no distress. He is not irritable.  HENT:  Head: Normocephalic.  Right Ear: External ear normal.  Left Ear: External ear normal.  Nose: Nose normal.  Mouth/Throat: Oropharynx is clear and moist.  Eyes: Conjunctivae and EOM are normal. Pupils are equal, round, and reactive to light. Right eye exhibits no discharge. Left eye exhibits no discharge. No scleral icterus.  Neck: Normal range of motion. Neck supple. No JVD present. No tracheal deviation present. No thyromegaly present.  Cardiovascular: Normal rate, regular rhythm, normal heart sounds and intact distal pulses. Exam reveals no gallop and no friction rub.  No murmur heard. Pulmonary/Chest: Breath sounds normal. No respiratory distress. He has no wheezes. He has no rales.  Abdominal: Soft. Bowel sounds are normal. He exhibits no mass. There is no hepatosplenomegaly. There is no tenderness. There is no rebound, no guarding and no CVA tenderness.  Musculoskeletal: Normal range of motion. He exhibits no edema or tenderness.   Lymphadenopathy:    He has no cervical adenopathy.  Neurological: He is alert and oriented to person, place, and time. He has normal sensation, normal strength, normal reflexes and intact cranial nerves. No cranial nerve deficit.  Skin: Skin is warm. No rash noted.  Psychiatric: Mood and affect normal.  Nursing note and vitals reviewed.     Assessment & Plan  Problem List Items Addressed This Visit      Cardiovascular and Mediastinum   Essential hypertension   Relevant Medications   gemfibrozil (LOPID) 600 MG tablet   simvastatin (ZOCOR) 40 MG tablet   furosemide (LASIX) 40 MG tablet   Other Relevant Orders   Renal function panel     Digestive   Gastroesophageal reflux disease without esophagitis  Relevant Medications   omeprazole (PRILOSEC) 40 MG capsule     Nervous and Auditory   Neuropathy associated with endocrine disorder (HCC)   Relevant Medications   pregabalin (LYRICA) 200 MG capsule     Other   Hyperlipidemia   Relevant Medications   gemfibrozil (LOPID) 600 MG tablet   simvastatin (ZOCOR) 40 MG tablet   furosemide (LASIX) 40 MG tablet   Depression   Relevant Medications   sertraline (ZOLOFT) 50 MG tablet    Other Visit Diagnoses    Generalized edema    -  Primary   Relevant Medications   furosemide (LASIX) 40 MG tablet   Idiopathic peripheral neuropathy       Relevant Medications   sertraline (ZOLOFT) 50 MG tablet   pregabalin (LYRICA) 200 MG capsule   Taking medication for chronic disease       Relevant Orders   Renal function panel      Meds ordered this encounter  Medications  . omeprazole (PRILOSEC) 40 MG capsule    Sig: TAKE 1 CAPSULE(40 MG) BY MOUTH DAILY    Dispense:  90 capsule    Refill:  1  . gemfibrozil (LOPID) 600 MG tablet    Sig: Take 1 tablet (600 mg total) by mouth 2 (two) times daily before a meal.    Dispense:  180 tablet    Refill:  1  . sertraline (ZOLOFT) 50 MG tablet    Sig: Take 0.5 tablets (25 mg total) by mouth  daily.    Dispense:  45 tablet    Refill:  1  . pregabalin (LYRICA) 200 MG capsule    Sig: Take 1 capsule (200 mg total) by mouth 2 (two) times daily.    Dispense:  60 capsule    Refill:  6  . simvastatin (ZOCOR) 40 MG tablet    Sig: Take 1 tablet (40 mg total) by mouth daily.    Dispense:  90 tablet    Refill:  1    May have 90 days but needs to sched appt for this month for med refills  . furosemide (LASIX) 40 MG tablet    Sig: Take 1 tablet (40 mg total) by mouth 2 (two) times daily.    Dispense:  180 tablet    Refill:  1      Dr. Otilio Miu [facility]  09/29/17

## 2017-09-29 NOTE — Patient Instructions (Signed)
Jeremy [name] , Thank you for taking time to come for your Medicare Wellness Visit. I appreciate your ongoing commitment to your health goals. Please review the following plan we discussed and let me know if I can assist you in the future.   Screening recommendations/referrals: Colonoscopy: You mentioned you recently had a colonoscopy. We are unable to determine the frequency of your colon cancer screenings. Please provide a copy of your most recent colonoscopy report. Recommended yearly ophthalmology/optometry visit for glaucoma screening and checkup Recommended yearly dental visit for hygiene and checkup  Vaccinations: Influenza vaccine: Completed 08/09/17 Pneumococcal vaccine: Completed [id number] 10/23/13. PCV13 given today Tdap vaccine: Declined. Please call your insurance company to determine your out of pocket expense. Shingles vaccine: Declined. Please call your insurance company to determine your out of pocket expense.  Advanced directives: Advance directive discussed with you today. I have provided a copy for you to complete at home and have notarized. Once this is complete please bring a copy in to our office so we can scan it into your chart.  Conditions/risks identified: Fall risk prevention discussed  Next appointment: You are scheduled to see Dr. Ronnald Ramp on 09/29/17 @ 9:15am.   Please schedule your annual wellness exam with your Nurse Health Advisor in one year.   Preventive Care 95 Years and Older, Male Preventive care refers to lifestyle choices and visits with your health care provider that can promote health and wellness. What does preventive care include?  A yearly physical exam. This is also called an annual well check.  Dental exams once or twice a year.  Routine eye exams. Ask your health care provider how often you should have your eyes checked.  Personal lifestyle choices, including:  Daily care of your teeth and gums.  Regular physical activity.  Eating a  healthy diet.  Avoiding tobacco and drug use.  Limiting alcohol use.  Practicing safe sex.  Taking low doses of aspirin every day.  Taking vitamin and mineral supplements as recommended by your health care provider. What happens during an annual well check? The services and screenings done by your health care provider during your annual well check will depend on your age, overall health, lifestyle risk factors, and family history of disease. Counseling  Your health care provider may ask you questions about your:  Alcohol use.  Tobacco use.  Drug use.  Emotional well-being.  Home and relationship well-being.  Sexual activity.  Eating habits.  History of falls.  Memory and ability to understand (cognition).  Work and work Statistician. Screening  You may have the following tests or measurements:  Height, weight, and BMI.  Blood pressure.  Lipid and cholesterol levels. These may be checked every 5 years, or more frequently if you are over 47 years old.  Skin check.  Lung cancer screening. You may have this screening every year starting at age 94 if you have a 30-pack-year history of smoking and currently smoke or have quit within the past 15 years.  Fecal occult blood test (FOBT) of the stool. You may have this test every year starting at age 38.  Flexible sigmoidoscopy or colonoscopy. You may have a sigmoidoscopy every 5 years or a colonoscopy every 10 years starting at age 69.  Prostate cancer screening. Recommendations will vary depending on your family history and other risks.  Hepatitis C blood test.  Hepatitis B blood test.  Sexually transmitted disease (STD) testing.  Diabetes screening. This is done by checking your blood sugar (glucose) after you  have not eaten for a while (fasting). You may have this done every 1-3 years.  Abdominal aortic aneurysm (AAA) screening. You may need this if you are a current or former smoker.  Osteoporosis. You may be  screened starting at age 32 if you are at high risk. Talk with your health care provider about your test results, treatment options, and if necessary, the need for more tests. Vaccines  Your health care provider may recommend certain vaccines, such as:  Influenza vaccine. This is recommended every year.  Tetanus, diphtheria, and acellular pertussis (Tdap, Td) vaccine. You may need a Td booster every 10 years.  Zoster vaccine. You may need this after age 56.  Pneumococcal 13-valent conjugate (PCV13) vaccine. One dose is recommended after age 10.  Pneumococcal polysaccharide (PPSV23) vaccine. One dose is recommended after age 10. Talk to your health care provider about which screenings and vaccines you need and how often you need them. This information is not intended to replace advice given to you by your health care provider. Make sure you discuss any questions you have with your health care provider. Document Released: 11/07/2015 Document Revised: 06/30/2016 Document Reviewed: 08/12/2015 Elsevier Interactive Patient Education  2017 [employer] Prevention in the Home Falls can cause injuries. They can happen to people of all ages. There are many things you can do to make your home safe and to help prevent falls. What can I do on the outside of my home?  Regularly fix the edges of walkways and driveways and fix any cracks.  Remove anything that might make you trip as you walk through a door, such as a raised step or threshold.  Trim any bushes or trees on the path to your home.  Use bright outdoor lighting.  Clear any walking paths of anything that might make someone trip, such as rocks or tools.  Regularly check to see if handrails are loose or broken. Make sure that both sides of any steps have handrails.  Any raised decks and porches should have guardrails on the edges.  Have any leaves, snow, or ice cleared regularly.  Use sand or salt on walking paths during  winter.  Clean up any spills in your garage right away. This includes oil or grease spills. What can I do in the bathroom?  Use night lights.  Install grab bars by the toilet and in the tub and shower. Do not use towel bars as grab bars.  Use non-skid mats or decals in the tub or shower.  If you need to sit down in the shower, use a plastic, non-slip stool.  Keep the floor dry. Clean up any water that spills on the floor as soon as it happens.  Remove soap buildup in the tub or shower regularly.  Attach bath mats securely with double-sided non-slip rug tape.  Do not have throw rugs and other things on the floor that can make you trip. What can I do in the bedroom?  Use night lights.  Make sure that you have a light by your bed that is easy to reach.  Do not use any sheets or blankets that are too big for your bed. They should not hang down onto the floor.  Have a firm chair that has side arms. You can use this for support while you get dressed.  Do not have throw rugs and other things on the floor that can make you trip. What can I do in the kitchen?  Clean up  any spills right away.  Avoid walking on wet floors.  Keep items that you use a lot in easy-to-reach places.  If you need to reach something above you, use a strong step stool that has a grab bar.  Keep electrical cords out of the way.  Do not use floor polish or wax that makes floors slippery. If you must use wax, use non-skid floor wax.  Do not have throw rugs and other things on the floor that can make you trip. What can I do with my stairs?  Do not leave any items on the stairs.  Make sure that there are handrails on both sides of the stairs and use them. Fix handrails that are broken or loose. Make sure that handrails are as long as the stairways.  Check any carpeting to make sure that it is firmly attached to the stairs. Fix any carpet that is loose or worn.  Avoid having throw rugs at the top or  bottom of the stairs. If you do have throw rugs, attach them to the floor with carpet tape.  Make sure that you have a light switch at the top of the stairs and the bottom of the stairs. If you do not have them, ask someone to add them for you. What else can I do to help prevent falls?  Wear shoes that:  Do not have high heels.  Have rubber bottoms.  Are comfortable and fit you well.  Are closed at the toe. Do not wear sandals.  If you use a stepladder:  Make sure that it is fully opened. Do not climb a closed stepladder.  Make sure that both sides of the stepladder are locked into place.  Ask someone to hold it for you, if possible.  Clearly mark and make sure that you can see:  Any grab bars or handrails.  First and last steps.  Where the edge of each step is.  Use tools that help you move around (mobility aids) if they are needed. These include:  Canes.  Walkers.  Scooters.  Crutches.  Turn on the lights when you go into a dark area. Replace any light bulbs as soon as they burn out.  Set up your furniture so you have a clear path. Avoid moving your furniture around.  If any of your floors are uneven, fix them.  If there are any pets around you, be aware of where they are.  Review your medicines with your doctor. Some medicines can make you feel dizzy. This can increase your chance of falling. Ask your doctor what other things that you can do to help prevent falls. This information is not intended to replace advice given to you by your health care provider. Make sure you discuss any questions you have with your health care provider. Document Released: 08/07/2009 Document Revised: 03/18/2016 Document Reviewed: 11/15/2014 Elsevier Interactive Patient Education  2017 [employer].

## 2017-09-29 NOTE — Progress Notes (Signed)
Subjective:   Jeremy [name] is a 66 y.o. male who presents for an Initial Medicare Annual Wellness Visit.  Review of Systems  N/A Cardiac Risk Factors include: advanced age (>28mn, >>35women);dyslipidemia;hypertension;male gender;obesity (BMI >30kg/m2);sedentary lifestyle    Objective:    Today's Vitals   09/29/17 0800 09/29/17 0802  BP: 126/78   Pulse: 74   Resp: 20   Temp: 97.8 F (36.6 C)   TempSrc: Oral   Weight: 239 lb 3.2 oz (108.5 kg)   Height: _0  (1.778 m)   PainSc:  0-No pain   Body mass index is 34.32 kg/m.  Advanced Directives 09/29/2017 09/11/2015  Does Patient Have a Medical Advance Directive? No Yes  Type of Advance Directive - Living will  Copy of [place] Chart? - No - copy requested  Would patient like information on creating a medical advance directive? Yes (MAU/Ambulatory/Procedural Areas - Information given) -    Current Medications (verified) Outpatient Encounter Medications as of 09/29/2017  Medication Sig  . amoxicillin-clavulanate (AUGMENTIN) 875-125 MG tablet Take 1 tablet by mouth 2 (two) times daily.  .Marland Kitchenaspirin 81 MG tablet Take 81 mg by mouth daily.  . cyanocobalamin (,VITAMIN B-12,) 1000 MCG/ML injection Inject into the muscle.  . docusate sodium (COLACE) 50 MG capsule Take 50 mg by mouth daily. otc   . ferrous sulfate 325 (65 FE) MG tablet TAKE 1 TABLET BY MOUTH THREE TIMES DAILY  . furosemide (LASIX) 40 MG tablet TAKE 1 TABLET BY MOUTH TWICE DAILY  . gemfibrozil (LOPID) 600 MG tablet Take 1 tablet (600 mg total) by mouth 2 (two) times daily before a meal.  . guaiFENesin-codeine (ROBITUSSIN AC) 100-10 MG/5ML syrup Take 5 mLs by mouth 3 (three) times daily as needed for cough.  . Insulin Glargine (LANTUS) 100 UNIT/ML Solostar Pen Inject 36 Units into the skin daily. Dr AMaretta Bees . Interferon Beta-1b (BETASERON) 0.3 MG KIT injection Inject subcutaneously 1  syringe every other day  . loratadine (CLARITIN) 10 MG tablet  Take 10 mg by mouth daily.  .Marland KitchenLYRICA 200 MG capsule TAKE 1 CAPSULE BY MOUTH TWICE DAILY  . metFORMIN (GLUCOPHAGE) 500 MG tablet Take by mouth.  . mupirocin ointment (BACTROBAN) 2 % Apply 1 application topically 2 (two) times daily. PRN  . omeprazole (PRILOSEC) 40 MG capsule TAKE 1 CAPSULE(40 MG) BY MOUTH DAILY  . sertraline (ZOLOFT) 50 MG tablet Take 0.5 tablets (25 mg total) by mouth daily.  . simvastatin (ZOCOR) 40 MG tablet TAKE 1 TABLET BY MOUTH EVERY DAY  . traMADol (ULTRAM) 50 MG tablet TAKE 1 TABLET BY MOUTH EVERY 6 HOURS AS NEEDED FOR PAIN   Facility-Administered Encounter Medications as of 09/29/2017  Medication  . cyanocobalamin ((VITAMIN B-12)) injection 1,000 mcg    Allergies (verified) Betadine [povidone iodine]   History: Past Medical History:  Diagnosis Date  . Chronic pain   . Depression   . Diabetes ([place]   . GERD (gastroesophageal reflux disease)   . Hyperlipemia   . Hypertension   . MS (multiple sclerosis) ([place]    Past Surgical History:  Procedure Laterality Date  . COLECTOMY  06-2008  . COLONOSCOPY  2015   normal   Family History  Problem Relation Age of Onset  . Cancer Mother   . Heart attack Father   . Heart attack Brother   . COPD Brother    Social History   Socioeconomic History  . Marital status: Married    Spouse name:  [name]  . Number of children: 2  . Years of education: GED  . Highest education level: None  Social Needs  . Financial resource strain: Not hard at all  . Food insecurity - worry: Never true  . Food insecurity - inability: Never true  . Transportation needs - medical: No  . Transportation needs - non-medical: No  Occupational History    Comment: Disabled  Tobacco Use  . Smoking status: Former Smoker    Packs/day: 1.50    Years: 30.00    Pack years: 45.00    Types: Cigarettes    Last attempt to quit: 01/23/2006    Years since quitting: 11.6  . Smokeless tobacco: Never Used  . Tobacco comment: N/A  Substance and  Sexual Activity  . Alcohol use: Yes    Alcohol/week: 0.0 oz    Comment: rare; maybe 2 beers a year  . Drug use: No  . Sexual activity: Not Currently  Other Topics Concern  . None  Social History Narrative   Patient is disabled.    Patient lives with his wife Jeremy [name].    Patient has 2 children.       Tobacco Counseling Counseling given: No Comment: N/A   Clinical Intake:  Pre-visit preparation completed: Yes  Pain : No/denies pain Pain Score: 0-No pain     BMI - recorded: 34.32 Nutritional Status: BMI > 30  Obese Nutritional Risks: None, Nausea/ vomitting/ diarrhea(nausea with Augmentin. Education provided) Diabetes: Yes CBG done?: No Did pt. bring in CBG monitor from home?: No  Activities of Daily Living: Independent Ambulation: Independent with device- listed below Home Assistive Devices/Equipment: Eyeglasses, Cane, Shower/tub chair Medication Administration: Independent Home Management: Independent  Barriers to Care Management & Learning: None  Do you feel unsafe in your current relationship?: No(married) Do you feel physically threatened by others?: No Anyone hurting you at home, work, or school?: No  How often do you need to have someone help you when you read instructions, pamphlets, or other written materials from your doctor or pharmacy?: 1 - Never What is the last grade level you completed in school?: 12th grade, GED  Interpreter Needed?: No  Information entered by :: AEversole, LPN  Activities of Daily Living In your present state of health, do you have any difficulty performing the following activities: 09/29/2017  Hearing? N  Vision? N  Difficulty concentrating or making decisions? Y  Comment short term memory loss  Walking or climbing stairs? Y  Comment back and joint pain.  Dressing or bathing? N  Doing errands, shopping? N  Preparing Food and eating ? N  Using the Toilet? N  In the past six months, have you accidently leaked urine? N    Do you have problems with loss of bowel control? N  Managing your Medications? N  Managing your Finances? N  Housekeeping or managing your Housekeeping? N  Some recent data might be hidden     Timed Get Up and Go performed: Yes. 13 sec with use of cane. Gait steady. No intervention required at this time.  Immunizations and Health Maintenance Immunization History  Administered Date(s) Administered  . Influenza, High Dose Seasonal PF 08/09/2017  . Influenza,inj,Quad PF,6+ Mos 07/28/2015, 08/06/2016  . Influenza-Unspecified 07/25/2014   Health Maintenance Due  Topic Date Due  . Hepatitis C Screening  1951/01/29  . HIV Screening  05/05/1967  . TETANUS/TDAP  05/05/1971  . COLONOSCOPY  05/04/2002  . FOOT EXAM  11/18/2016  . URINE MICROALBUMIN  12/02/2016  .  HEMOGLOBIN A1C  02/04/2017    Patient Care Team: Juline Patch, MD as PCP - General (Family Medicine) Marcial Pacas, MD as Consulting Physician (Neurology) Samara Deist, DPM as Consulting Physician (Podiatry) Lonia Farber, MD as Consulting Physician (Internal Medicine) Magnus Sinning, MD as Consulting Physician (Nephrology)  Indicate any recent Medical Services you may have received from other than [facility] providers in the past year (date may be approximate).    Assessment:   This is a routine wellness examination for Jeremy [name].   Hearing/Vision screen Vision Screening Comments: [place] for annual eye exams  Dietary issues and exercise activities discussed: Current Exercise Habits: Structured exercise class, Type of exercise: Other - see comments(recumbant bike), Time (Minutes): 30, Frequency (Times/Week): 3, Weekly Exercise (Minutes/Week): 90, Intensity: Mild, Exercise limited by: Other - see comments(h/o MS)  Goals    . Prevent falls     Recommend to maintain adequate lighting in walkways, remove rugs that may cause slips or trips and continue to use cane for assistance with ambulation.       Depression Screen PHQ 2/9 Scores 09/29/2017 01/01/2016 12/03/2015 11/19/2015  PHQ - 2 Score 0 0 0 0    Fall Risk Fall Risk  09/29/2017 03/15/2017 01/01/2016 12/03/2015 11/19/2015  Falls in the past year? _0   Number falls in past yr: 2 or more 2 or more 2 or more 2 or more 2 or more  Injury with Fall? Yes No No No No  Comment rib fracture - - - -  Risk Factor Category  High Fall Risk - High Fall Risk - High Fall Risk  Comment h/o MS - - - -  Risk for fall due to : History of fall(s);Impaired balance/gait;Impaired mobility;Impaired vision - - - Impaired balance/gait  Risk for fall due to: Comment h/o MS. Walks with cane. Wears eyeglasses - - - -  Follow up Falls evaluation completed;Falls prevention discussed;Education provided - Education provided - Falls evaluation completed  Comment TUG test performed. Walks with cane. H/o MS - - - -    Is the patient's home free of loose throw rugs in walkways, pet beds, electrical cords, etc?   yes      Grab bars in the bathroom? No      Handrails on the stairs?   N/A. Does not have any stairs in or around his home      Adequate lighting?   yes  Cognitive Function:     6CIT Screen 09/29/2017  What Year? 0 points  What month? 0 points  What time? 0 points  Count back from 20 0 points  Months in reverse 0 points  Repeat phrase 2 points  Total Score 2    Screening Tests Health Maintenance  Topic Date Due  . Hepatitis C Screening  1951-08-28  . HIV Screening  05/05/1967  . TETANUS/TDAP  05/05/1971  . COLONOSCOPY  05/04/2002  . FOOT EXAM  11/18/2016  . URINE MICROALBUMIN  12/02/2016  . HEMOGLOBIN A1C  02/04/2017  . OPHTHALMOLOGY EXAM  03/16/2018  . PNA vac Low Risk Adult (2 of 2 - PPSV23) 09/29/2018  . INFLUENZA VACCINE  Completed   Cancer Screenings: Colorectal: Pt mentioned he recently had a colonoscopy but was unable to determine date. I am unable to locate a report in Epic or Care Everywhere to determine the frequency of  his colon cancer screenings. Pt has been advised to provide a copy of his most recent colonoscopy report for  our review.  Additional Screenings: HIV Screening: Ordered today. Hepatitis C Screening: Ordered today      Plan:   I have personally reviewed and addressed the Medicare Annual Wellness questionnaire and have noted the following in the patient's chart:  A. Medical and social history B. Use of alcohol, tobacco or illicit drugs  C. Current medications and supplements D. Functional ability and status E.  Nutritional status F.  Physical activity G. Advance directives H. List of other physicians I.  Hospitalizations, surgeries, and ER visits in previous 12 months J.  [place] such as hearing and vision if needed, cognitive and depression L. Referrals and appointments - none  In addition, I have reviewed and discussed with patient certain preventive protocols, quality metrics, and best practice recommendations. A written personalized care plan for preventive services as well as general preventive health recommendations were provided to patient.  See attached scanned questionnaire for additional information.   Signed,  Aleatha Borer, LPN Nurse Health Advisor  MD Recommendations:

## 2017-09-30 LAB — RENAL FUNCTION PANEL
ALBUMIN: 4.4 g/dL (ref 3.6–4.8)
BUN/Creatinine Ratio: 17 (ref 10–24)
BUN: 22 mg/dL (ref 8–27)
CO2: 23 mmol/L (ref 20–29)
CREATININE: 1.26 mg/dL (ref 0.76–1.27)
Calcium: 10.4 mg/dL — ABNORMAL HIGH (ref 8.6–10.2)
Chloride: 99 mmol/L (ref 96–106)
GFR calc Af Amer: 69 mL/min/{1.73_m2} (ref >59)
GFR, EST NON AFRICAN AMERICAN: 59 mL/min/{1.73_m2} — AB (ref >59)
Glucose: 127 mg/dL — ABNORMAL HIGH (ref 65–99)
PHOSPHORUS: 3.8 mg/dL (ref 2.5–4.5)
Potassium: 4.8 mmol/L (ref 3.5–5.2)
Sodium: 138 mmol/L (ref 134–144)

## 2017-09-30 LAB — HEPATITIS C ANTIBODY: Hep C Virus Ab: <0.1 s/co ratio (ref 0.0–0.9)

## 2017-09-30 LAB — HIV ANTIBODY (ROUTINE TESTING W REFLEX): HIV Screen 4th Generation wRfx: NONREACTIVE

## 2017-10-04 ENCOUNTER — Ambulatory Visit (INDEPENDENT_AMBULATORY_CARE_PROVIDER_SITE_OTHER): Payer: Medicare Other

## 2017-10-04 DIAGNOSIS — D519 Vitamin B12 deficiency anemia, unspecified: Secondary | ICD-10-CM

## 2017-10-04 MED ORDER — CYANOCOBALAMIN 1000 MCG/ML IJ SOLN
1000.0000 ug | Freq: Once | INTRAMUSCULAR | Status: AC
  Administered 2017-10-04: 1000 ug via INTRAMUSCULAR

## 2017-10-06 ENCOUNTER — Ambulatory Visit
Admission: RE | Admit: 2017-10-06 | Discharge: 2017-10-06 | Disposition: A | Discharge status: Home / Self Care | Payer: Medicare Other | Source: Ambulatory Visit | Attending: Neurology | Admitting: Neurology

## 2017-10-06 DIAGNOSIS — R269 Unspecified abnormalities of gait and mobility: Secondary | ICD-10-CM | POA: Diagnosis not present

## 2017-10-08 ENCOUNTER — Other Ambulatory Visit: Payer: Self-pay | Admitting: Nurse Practitioner

## 2017-10-10 NOTE — Telephone Encounter (Signed)
Are you continuing to refill this for patient?

## 2017-10-10 NOTE — Telephone Encounter (Signed)
Faxed to [facility] in [place]

## 2017-10-13 ENCOUNTER — Other Ambulatory Visit: Payer: Self-pay | Admitting: Family Medicine

## 2017-10-13 DIAGNOSIS — E785 Hyperlipidemia, unspecified: Secondary | ICD-10-CM

## 2017-10-19 ENCOUNTER — Ambulatory Visit (INDEPENDENT_AMBULATORY_CARE_PROVIDER_SITE_OTHER): Payer: Medicare Other

## 2017-10-19 DIAGNOSIS — E538 Deficiency of other specified B group vitamins: Secondary | ICD-10-CM | POA: Diagnosis not present

## 2017-10-19 MED ORDER — CYANOCOBALAMIN 1000 MCG/ML IJ SOLN
1000.0000 ug | Freq: Once | INTRAMUSCULAR | Status: AC
  Administered 2017-10-19: 1000 ug via INTRAMUSCULAR

## 2017-10-21 DIAGNOSIS — N183 Chronic kidney disease, stage 3 (moderate): Secondary | ICD-10-CM | POA: Diagnosis not present

## 2017-10-21 DIAGNOSIS — R609 Edema, unspecified: Secondary | ICD-10-CM | POA: Diagnosis not present

## 2017-10-21 DIAGNOSIS — E1129 Type 2 diabetes mellitus with other diabetic kidney complication: Secondary | ICD-10-CM | POA: Diagnosis not present

## 2017-10-21 DIAGNOSIS — I270 Primary pulmonary hypertension: Secondary | ICD-10-CM | POA: Diagnosis not present

## 2017-10-26 DIAGNOSIS — E1142 Type 2 diabetes mellitus with diabetic polyneuropathy: Secondary | ICD-10-CM | POA: Diagnosis not present

## 2017-10-26 DIAGNOSIS — B351 Tinea unguium: Secondary | ICD-10-CM | POA: Diagnosis not present

## 2017-10-27 DIAGNOSIS — E1129 Type 2 diabetes mellitus with other diabetic kidney complication: Secondary | ICD-10-CM | POA: Diagnosis not present

## 2017-10-27 DIAGNOSIS — N183 Chronic kidney disease, stage 3 (moderate): Secondary | ICD-10-CM | POA: Diagnosis not present

## 2017-10-27 DIAGNOSIS — R609 Edema, unspecified: Secondary | ICD-10-CM | POA: Diagnosis not present

## 2017-11-01 ENCOUNTER — Ambulatory Visit (INDEPENDENT_AMBULATORY_CARE_PROVIDER_SITE_OTHER): Payer: Medicare Other

## 2017-11-01 DIAGNOSIS — D519 Vitamin B12 deficiency anemia, unspecified: Secondary | ICD-10-CM | POA: Diagnosis not present

## 2017-11-01 MED ORDER — CYANOCOBALAMIN 1000 MCG/ML IJ SOLN
1000.0000 ug | Freq: Once | INTRAMUSCULAR | Status: AC
  Administered 2017-11-01: 1000 ug via INTRAMUSCULAR

## 2017-11-02 ENCOUNTER — Other Ambulatory Visit: Payer: Self-pay | Admitting: Family Medicine

## 2017-11-15 ENCOUNTER — Ambulatory Visit (INDEPENDENT_AMBULATORY_CARE_PROVIDER_SITE_OTHER): Payer: Medicare Other

## 2017-11-15 DIAGNOSIS — D518 Other vitamin B12 deficiency anemias: Secondary | ICD-10-CM | POA: Diagnosis not present

## 2017-11-15 MED ORDER — CYANOCOBALAMIN 1000 MCG/ML IJ SOLN
1000.0000 ug | Freq: Once | INTRAMUSCULAR | Status: AC
  Administered 2017-11-15: 1000 ug via INTRAMUSCULAR

## 2017-11-23 ENCOUNTER — Other Ambulatory Visit: Payer: Self-pay | Admitting: Neurology

## 2017-11-23 NOTE — Telephone Encounter (Signed)
[place] narcotic registry checked without any issues identified.

## 2017-11-29 ENCOUNTER — Ambulatory Visit (INDEPENDENT_AMBULATORY_CARE_PROVIDER_SITE_OTHER): Payer: Medicare Other

## 2017-11-29 DIAGNOSIS — D519 Vitamin B12 deficiency anemia, unspecified: Secondary | ICD-10-CM | POA: Diagnosis not present

## 2017-11-29 MED ORDER — CYANOCOBALAMIN 1000 MCG/ML IJ SOLN
1000.0000 ug | Freq: Once | INTRAMUSCULAR | Status: AC
  Administered 2017-11-29: 1000 ug via INTRAMUSCULAR

## 2017-12-13 ENCOUNTER — Ambulatory Visit (INDEPENDENT_AMBULATORY_CARE_PROVIDER_SITE_OTHER): Payer: Medicare Other

## 2017-12-13 DIAGNOSIS — D519 Vitamin B12 deficiency anemia, unspecified: Secondary | ICD-10-CM

## 2017-12-13 MED ORDER — CYANOCOBALAMIN 1000 MCG/ML IJ SOLN
1000.0000 ug | Freq: Once | INTRAMUSCULAR | Status: AC
  Administered 2017-12-13: 1000 ug via INTRAMUSCULAR

## 2017-12-21 ENCOUNTER — Encounter: Payer: Self-pay | Admitting: Family Medicine

## 2017-12-21 ENCOUNTER — Ambulatory Visit (INDEPENDENT_AMBULATORY_CARE_PROVIDER_SITE_OTHER): Payer: Medicare Other | Admitting: Family Medicine

## 2017-12-21 VITALS — BP 130/76 | HR 80 | Temp 98.2°F | Ht 70.0 in | Wt 239.0 lb

## 2017-12-21 DIAGNOSIS — J0100 Acute maxillary sinusitis, unspecified: Secondary | ICD-10-CM

## 2017-12-21 LAB — POCT INFLUENZA A/B
Influenza A, POC: NEGATIVE
Influenza B, POC: NEGATIVE

## 2017-12-21 MED ORDER — AMOXICILLIN-POT CLAVULANATE 875-125 MG PO TABS: 1.0000 | ORAL_TABLET | Freq: Two times a day (BID) | ORAL | 0 refills | Status: DC

## 2017-12-21 NOTE — Progress Notes (Signed)
Name: Jeremy [name]   MRN: [id number]    DOB: [birth year]   Date:12/21/2017       Progress Note  Subjective  Chief Complaint  Chief Complaint  Patient presents with  . Chills    cong, wheezing, SOB, sister was Dx with flu    Fever   This is a new problem. The current episode started yesterday. The problem occurs constantly. The problem has been gradually worsening. The maximum temperature noted was 100 to 100.9 F. The temperature was taken using an oral thermometer. Associated symptoms include congestion, coughing, headaches, muscle aches and wheezing. Pertinent negatives include no abdominal pain, chest pain, diarrhea, ear pain, nausea, rash, sore throat, urinary pain or vomiting. Treatments tried: tylenol. The treatment provided mild relief.    No problem-specific Assessment & Plan notes found for this encounter.   Past Medical History:  Diagnosis Date  . Chronic pain   . Depression   . Diabetes ([place])   . GERD (gastroesophageal reflux disease)   . Hyperlipemia   . Hypertension   . MS (multiple sclerosis) ([place])     Past Surgical History:  Procedure Laterality Date  . COLECTOMY  06-2008  . COLONOSCOPY  2015   normal    Family History  Problem Relation Age of Onset  . Cancer Mother   . Heart attack Father   . Heart attack Brother   . COPD Brother     Social History   Socioeconomic History  . Marital status: Married    Spouse name: Jeremy [name]  . Number of children: 2  . Years of education: GED  . Highest education level: Not on file  Social Needs  . Financial resource strain: Not hard at all  . Food insecurity - worry: Never true  . Food insecurity - inability: Never true  . Transportation needs - medical: No  . Transportation needs - non-medical: No  Occupational History    Comment: Disabled  Tobacco Use  . Smoking status: Former Smoker    Packs/day: 1.50    Years: 30.00    Pack years: 45.00    Types: Cigarettes    Last attempt to quit: 01/23/2006    Years  since quitting: 11.9  . Smokeless tobacco: Never Used  . Tobacco comment: N/A  Substance and Sexual Activity  . Alcohol use: Yes    Alcohol/week: 0.0 oz    Comment: rare; maybe 2 beers a year  . Drug use: No  . Sexual activity: Not Currently  Other Topics Concern  . Not on file  Social History Narrative   Patient is disabled.    Patient lives with his wife Jeremy [name].    Patient has 2 children.        Allergies  Allergen Reactions  . Betadine [Povidone Iodine]     Outpatient Medications Prior to Visit  Medication Sig Dispense Refill  . aspirin 81 MG tablet Take 81 mg by mouth daily.    . cyanocobalamin (,VITAMIN B-12,) 1000 MCG/ML injection Inject into the muscle.    . docusate sodium (COLACE) 50 MG capsule Take 50 mg by mouth daily. otc     . FEROSUL 325 (65 Fe) MG tablet TAKE 1 TABLET BY MOUTH THREE TIMES DAILY 270 tablet 0  . furosemide (LASIX) 40 MG tablet Take 1 tablet (40 mg total) by mouth 2 (two) times daily. 180 tablet 1  . gemfibrozil (LOPID) 600 MG tablet Take 1 tablet (600 mg total) by mouth 2 (two) times daily before a meal.  180 tablet 1  . Insulin Glargine (LANTUS) 100 UNIT/ML Solostar Pen Inject 36 Units into the skin daily. Dr Honor Junes    . Interferon Beta-1b (BETASERON) 0.3 MG KIT injection Inject subcutaneously 1  syringe every other day 42 each 6  . loratadine (CLARITIN) 10 MG tablet Take 10 mg by mouth daily.    . metFORMIN (GLUCOPHAGE) 500 MG tablet Take 500 mg by mouth daily with breakfast. Dr Honor Junes    . mupirocin ointment (BACTROBAN) 2 % Apply 1 application topically 2 (two) times daily. PRN    . omeprazole (PRILOSEC) 40 MG capsule TAKE 1 CAPSULE(40 MG) BY MOUTH DAILY 90 capsule 1  . pregabalin (LYRICA) 200 MG capsule Take 1 capsule (200 mg total) by mouth 2 (two) times daily. 60 capsule 6  . sertraline (ZOLOFT) 50 MG tablet Take 0.5 tablets (25 mg total) by mouth daily. 45 tablet 1  . simvastatin (ZOCOR) 40 MG tablet Take 1 tablet (40 mg total) by  mouth daily. 90 tablet 1  . traMADol (ULTRAM) 50 MG tablet TAKE 1 TABLET BY MOUTH EVERY 6 HOURS AS NEEDED FOR PAIN 120 tablet 0  . amoxicillin-clavulanate (AUGMENTIN) 875-125 MG tablet Take 1 tablet by mouth 2 (two) times daily. 20 tablet 0  . gemfibrozil (LOPID) 600 MG tablet TAKE 1 TABLET(600 MG) BY MOUTH TWICE DAILY BEFORE A MEAL 180 tablet 0  . guaiFENesin-codeine (ROBITUSSIN AC) 100-10 MG/5ML syrup Take 5 mLs by mouth 3 (three) times daily as needed for cough. 150 mL 0   Facility-Administered Medications Prior to Visit  Medication Dose Route Frequency Provider Last Rate Last Dose  . cyanocobalamin ((VITAMIN B-12)) injection 1,000 mcg  1,000 mcg Intramuscular Once Juline Patch, MD        Review of Systems  Constitutional: Positive for fever. Negative for chills, malaise/fatigue and weight loss.  HENT: Positive for congestion. Negative for ear discharge, ear pain and sore throat.   Eyes: Negative for blurred vision.  Respiratory: Positive for cough and wheezing. Negative for sputum production and shortness of breath.   Cardiovascular: Negative for chest pain, palpitations and leg swelling.  Gastrointestinal: Negative for abdominal pain, blood in stool, constipation, diarrhea, heartburn, melena, nausea and vomiting.  Genitourinary: Negative for dysuria, frequency, hematuria and urgency.  Musculoskeletal: Negative for back pain, joint pain, myalgias and neck pain.  Skin: Negative for rash.  Neurological: Positive for headaches. Negative for dizziness, tingling, sensory change and focal weakness.  Endo/Heme/Allergies: Negative for environmental allergies and polydipsia. Does not bruise/bleed easily.  Psychiatric/Behavioral: Negative for depression and suicidal ideas. The patient is not nervous/anxious and does not have insomnia.      Objective  Vitals:   12/21/17 0945  BP: 130/76  [name]: 80  Temp: 98.2 F (36.8 C)  TempSrc: Oral  Weight: 239 lb (108.4 kg)  Height: 5' 10"   (1.778 m)    Physical Exam  Constitutional: He is oriented to person, place, and time and well-developed, well-nourished, and in no distress.  HENT:  Head: Normocephalic.  Right Ear: Tympanic membrane, external ear and ear canal normal.  Left Ear: Tympanic membrane, external ear and ear canal normal.  Nose: Right sinus exhibits maxillary sinus tenderness. Right sinus exhibits no frontal sinus tenderness. Left sinus exhibits no maxillary sinus tenderness and no frontal sinus tenderness.  Mouth/Throat: Oropharynx is clear and moist.  Eyes: Conjunctivae and EOM are normal. Pupils are equal, round, and reactive to light. Right eye exhibits no discharge. Left eye exhibits no discharge. No scleral icterus.  Neck: Normal range of motion. Neck supple. No JVD present. No tracheal deviation present. No thyromegaly present.  Cardiovascular: Normal rate, regular rhythm, normal heart sounds and intact distal pulses. Exam reveals no gallop and no friction rub.  No murmur heard. Pulmonary/Chest: Breath sounds normal. No respiratory distress. He has no wheezes. He has no rales.  Abdominal: Soft. Bowel sounds are normal. He exhibits no mass. There is no hepatosplenomegaly. There is no tenderness. There is no rebound, no guarding and no CVA tenderness.  Musculoskeletal: Normal range of motion. He exhibits no edema or tenderness.  Lymphadenopathy:       Head (right side): Submandibular adenopathy present.       Head (left side): Submandibular adenopathy present.    He has no cervical adenopathy.  Neurological: He is alert and oriented to person, place, and time. He has normal sensation, normal strength, normal reflexes and intact cranial nerves. No cranial nerve deficit.  Skin: Skin is warm. No rash noted.  Psychiatric: Mood and affect normal.  Nursing note and vitals reviewed.     Assessment & Plan  Problem List Items Addressed This Visit    None    Visit Diagnoses    Acute maxillary sinusitis,  recurrence not specified    -  Primary   Relevant Medications   amoxicillin-clavulanate (AUGMENTIN) 875-125 MG tablet   Other Relevant Orders   POCT Influenza A/B (Completed)      Meds ordered this encounter  Medications  . amoxicillin-clavulanate (AUGMENTIN) 875-125 MG tablet    Sig: Take 1 tablet by mouth 2 (two) times daily.    Dispense:  20 tablet    Refill:  0      Dr. Otilio Miu [facility]  12/21/17

## 2017-12-27 ENCOUNTER — Ambulatory Visit (INDEPENDENT_AMBULATORY_CARE_PROVIDER_SITE_OTHER): Payer: Medicare Other

## 2017-12-27 DIAGNOSIS — D519 Vitamin B12 deficiency anemia, unspecified: Secondary | ICD-10-CM | POA: Diagnosis not present

## 2017-12-27 MED ORDER — CYANOCOBALAMIN 1000 MCG/ML IJ SOLN
1000.0000 ug | Freq: Once | INTRAMUSCULAR | Status: AC
  Administered 2017-12-27: 1000 ug via INTRAMUSCULAR

## 2017-12-30 DIAGNOSIS — E785 Hyperlipidemia, unspecified: Secondary | ICD-10-CM | POA: Diagnosis not present

## 2017-12-30 DIAGNOSIS — Z794 Long term (current) use of insulin: Secondary | ICD-10-CM | POA: Diagnosis not present

## 2017-12-30 DIAGNOSIS — E1169 Type 2 diabetes mellitus with other specified complication: Secondary | ICD-10-CM | POA: Diagnosis not present

## 2017-12-30 DIAGNOSIS — E1142 Type 2 diabetes mellitus with diabetic polyneuropathy: Secondary | ICD-10-CM | POA: Diagnosis not present

## 2018-01-02 ENCOUNTER — Other Ambulatory Visit: Payer: Self-pay | Admitting: Neurology

## 2018-01-03 NOTE — Telephone Encounter (Signed)
Ok per vo by Dr. Krista Blue to provide refill.  [place] narcotic registry was checked without any concerns noted.

## 2018-01-10 ENCOUNTER — Ambulatory Visit (INDEPENDENT_AMBULATORY_CARE_PROVIDER_SITE_OTHER): Payer: Medicare Other

## 2018-01-10 DIAGNOSIS — D518 Other vitamin B12 deficiency anemias: Secondary | ICD-10-CM

## 2018-01-10 MED ORDER — CYANOCOBALAMIN 1000 MCG/ML IJ SOLN
1000.0000 ug | Freq: Once | INTRAMUSCULAR | Status: AC
  Administered 2018-01-10: 1000 ug via INTRAMUSCULAR

## 2018-01-24 ENCOUNTER — Ambulatory Visit (INDEPENDENT_AMBULATORY_CARE_PROVIDER_SITE_OTHER): Payer: Medicare Other

## 2018-01-24 DIAGNOSIS — D518 Other vitamin B12 deficiency anemias: Secondary | ICD-10-CM

## 2018-01-24 MED ORDER — CYANOCOBALAMIN 1000 MCG/ML IJ SOLN
1000.0000 ug | Freq: Once | INTRAMUSCULAR | Status: AC
  Administered 2018-01-24: 1000 ug via INTRAMUSCULAR

## 2018-02-07 ENCOUNTER — Ambulatory Visit (INDEPENDENT_AMBULATORY_CARE_PROVIDER_SITE_OTHER): Payer: Medicare Other

## 2018-02-07 ENCOUNTER — Other Ambulatory Visit: Payer: Self-pay | Admitting: Neurology

## 2018-02-07 DIAGNOSIS — D519 Vitamin B12 deficiency anemia, unspecified: Secondary | ICD-10-CM | POA: Diagnosis not present

## 2018-02-07 MED ORDER — CYANOCOBALAMIN 1000 MCG/ML IJ SOLN
1000.0000 ug | Freq: Once | INTRAMUSCULAR | Status: AC
  Administered 2018-02-07: 1000 ug via INTRAMUSCULAR

## 2018-02-21 ENCOUNTER — Ambulatory Visit (INDEPENDENT_AMBULATORY_CARE_PROVIDER_SITE_OTHER): Payer: Medicare Other

## 2018-02-21 DIAGNOSIS — D519 Vitamin B12 deficiency anemia, unspecified: Secondary | ICD-10-CM | POA: Diagnosis not present

## 2018-02-21 MED ORDER — CYANOCOBALAMIN 1000 MCG/ML IJ SOLN
1000.0000 ug | Freq: Once | INTRAMUSCULAR | Status: AC
  Administered 2018-02-21: 1000 ug via INTRAMUSCULAR

## 2018-03-07 ENCOUNTER — Ambulatory Visit (INDEPENDENT_AMBULATORY_CARE_PROVIDER_SITE_OTHER): Payer: Medicare Other

## 2018-03-07 DIAGNOSIS — D519 Vitamin B12 deficiency anemia, unspecified: Secondary | ICD-10-CM

## 2018-03-07 MED ORDER — CYANOCOBALAMIN 1000 MCG/ML IJ SOLN
1000.0000 ug | Freq: Once | INTRAMUSCULAR | Status: AC
  Administered 2018-03-07: 1000 ug via INTRAMUSCULAR

## 2018-03-22 ENCOUNTER — Ambulatory Visit (INDEPENDENT_AMBULATORY_CARE_PROVIDER_SITE_OTHER): Payer: Medicare Other

## 2018-03-22 DIAGNOSIS — D518 Other vitamin B12 deficiency anemias: Secondary | ICD-10-CM

## 2018-03-22 MED ORDER — CYANOCOBALAMIN 1000 MCG/ML IJ SOLN
1000.0000 ug | Freq: Once | INTRAMUSCULAR | Status: AC
  Administered 2018-03-22: 1000 ug via INTRAMUSCULAR

## 2018-03-22 NOTE — Progress Notes (Signed)
 [facility]  PATIENT: Jeremy [name] DOB: 12/19/1950   REASON FOR VISIT: Follow-up for multiple sclerosis gait abnormality, HISTORY FROM: Patient and wife Jeremy [name]    HISTORY OF PRESENT ILLNESS:HISTORY:He has PMHx of diabetes, hypertension and multiple sclerosis. The diagnosis of multiple sclerosis was confirmed by abnormal MRI brain and cervical, and a spinal fluid testing. He was initially followed by Dr. Champey in 2008. Last seen in this office Oct 2014, He continues to have gait difficulties and relies on a cane for ambulation. He has had no problems with his vision or bowel/bladder control.  He has had occasional falls.He has ongoing pain in his legs. Last MS flare was over 2 years ago. He is currently on Betaseron every other day, treatment started in 2007.   MRI cervical in 2013 with findings consistent with MS plaques and no enhancement,mild spinal stenosis.  MRI of the brain with prominent white matter changes consistent with MS. No active areas of demyelination. He continues to have problems with low   The initial symptom was acute onset of left leg more than arm weakness, gait difficulty in 2006. This eventually led to the diagnosis of relapsing remitting multiple sclerosis. He has been receiving Betaseron treatment since early 2007. He has tolerated the medication well. He has had no problems with lipoatrophy or skin necrosis. In September 2009, he developed GI bleeding. Workup had demonstrated colon inflammation and obstruction.He underwent colectomy and recovered well from that procedure.  He continues to have gait difficulties and relies on a cane for ambulation. He has had no problems with his vision or bowel/bladder control. He does have chronic extremity pain, which has been managed with Lyrica and Tramadol.   He uses an electric scooter for situations in which he would need to walk long distances. He has had occasional falls.He has ongoing  pain in his legs  UPDATE 07/2014: He was admitted to hospital in Nov 2014 for anemia, low blood count, was evaluated by hematologist in [place], now has recovered, he still has moderate gait difficulty.also complains bilateral lower extremity spasticity, is taking Lyrica, there was no clinical flareups, last MRI was in 2013, there was significant lesions in brain, and cervical spine  UPDATE 09/03/2014: He has tried Baclofen 10mg qhs, complains of excessive sleepiness, could not tolerate it, he still mows the yard, the most limitation is his gait difficulty,he denies bowel and bladder incontinence.  We have reviewed MRI togather, MRI scan of brain showing periventricular and subcortical white matter hyperintensities consistent with multiple sclerosis. Presence of atrophy of corpus callosum indicates chronic disease. No significant change compared with MRI 06/02/2012   MRI cervical spine showing mild spondylitic changes descibed above most prominent at C 3-4 where there is moderate left formaminal narrowing. Ill defined spinal cord hypertintensities at C 3-4 to c 6-7 likely represent chronic multiple sclerosis inactive plaques. No significant change compared with MRI C spine 06/02/2012  UPDATE 11//17/16 [name], 64-year-old male returns for follow-up. He was last seen in this office 03/06/15.  He was diagnosed with multiple sclerosis in 2006. He is currently on Betaseron every other day without injection site issues.. He still remains fairly active mowing the yard, denies any bowel or bladder incontinence. He denies any sensory changes, double vision speech or swallowing difficulty. He ambulates with a cane. He has had a couple of falls since last seen. He returns for reevaluation  UPDATE May 17th 2017:YY He is with his wife at today's clinical visit, he continued to receive   Betaseron every other day without significant side effect, he has significant gait difficulty, left side is weaker, complains of  chronic fatigue, chronic low back pain, bilateral lower extremity neuropathic pain, taking Lyrica, tramadol as needed, Reviewed laboratory evaluation, A1c 7.9, CMP showed elevated creatinine 1.4, normal CBC We again personally reviewed MRI of the scan without contrast, most recent was in 2015, MRI of the brain showed multiple supratentorium lesions, no contrast enhancement, MRI of cervical spine showed ill-defined C3-C7 cord lesion, no contrast enhancement, no significant change compared to previous scans UPDATE 11/20/2017CM Jeremy [name], 67 year old male returns for follow-up with his wife is a history of multiple sclerosis and has been off of his Betaseron for approximately a week. He is trying to get patient assistance. Most recent MRI of the brain showed multiple supratentorium lesions no contrast enhancement. MRI of cervical spine showing ill-defined C3-C7 cord lesion that contrast enhancement, no significant changes when compared to previous scans. He continues to complain with some fatigue. He ambulates with a cane he denies any recent falls. He remains on Lyrica and tramadol with benefit. He returns for reevaluation UPDATE 03/15/17 CM Jeremy [name], 67 year old male returns for follow-up with diagnosis of multiple sclerosis gait abnormality. He continues to fall mostly in his backyard. He ambulates with single-point cane. He has not had any injury He is on Betaseron every other day and denies injection site problems. His wife gives him his injection. He continues to complain with fatigue. He has vitamin B12 deficiency shots every 2 weeks he remains on Lyrica and tramadol for his pain. Most recent hemoglobin A1c 5.5 MRI of the brain and cervical spine in 2015 without significant change when compared to previous lumbar spine MRI in March 2017 with degenerative disc disease at L5-S1 and to a lesser degree at L4-L5. Update September 22, 2017:YY Creatinine 1.27 in July 2018, he is overall doing very well, mild  gait abnormality, use cane, still active at home, providing normal, no bowel bladder incontinence, diabetes, diabetic peripheral neuropathy, using Betaseron, last MRI was in 2015, UPDATE 5/30/2019CM Mr. Hippert, 67 year old male returns for follow-up with history of multiple sclerosis, diabetic peripheral neuropathy and gait disorder.  He also has a history of degenerative disc disease L5-S1.  He is currently on Betaseron tolerating it without side effects.  Last MRI of the brain 10/06/2017 results Abnormal MRI scan of brain without contrast showing multiple subcortical and periventricular nonenhancing lesions consistent with chronic demyelinating disease. No enhancing lesions are noted. Overall no significant change c/w MRI 08/15/2014.  He had 1 or 2 falls a month no significant injury.  He uses a cane at all times.  He still remains active at home.  He returns for reevaluation REVIEW OF SYSTEMS: Full 14 system review of systems performed and notable only for those listed, all others are neg:  Constitutional: Fatigue  Cardiovascular: Swelling in the legs Ear/Nose/Throat: neg  Skin: neg Eyes: neg Respiratory: neg Gastroitestinal: neg  Hematology/Lymphatic: Anemia Endocrine: Intolerance to heat Musculoskeletal: Joint pain back pain walking difficulty Allergy/Immunology: neg Neurological: Weakness Psychiatric: neg Sleep : neg   ALLERGIES: Allergies  Allergen Reactions  . Betadine [Povidone Iodine]     HOME MEDICATIONS: Outpatient Medications Prior to Visit  Medication Sig Dispense Refill  . aspirin 81 MG tablet Take 81 mg by mouth daily.    . cyanocobalamin (,VITAMIN B-12,) 1000 MCG/ML injection Inject into the muscle.    . docusate sodium (COLACE) 50 MG capsule Take 50 mg by mouth daily. otc     .  FEROSUL 325 (65 Fe) MG tablet TAKE 1 TABLET BY MOUTH THREE TIMES DAILY 270 tablet 0  . furosemide (LASIX) 40 MG tablet Take 1 tablet (40 mg total) by mouth 2 (two) times daily. 180 tablet 1  .  gemfibrozil (LOPID) 600 MG tablet Take 1 tablet (600 mg total) by mouth 2 (two) times daily before a meal. 180 tablet 1  . Insulin Glargine (LANTUS) 100 UNIT/ML Solostar Pen Inject 36 Units into the skin daily. Dr Honor Junes    . Interferon Beta-1b (BETASERON) 0.3 MG KIT injection Inject subcutaneously 1  syringe every other day 42 each 6  . loratadine (CLARITIN) 10 MG tablet Take 10 mg by mouth daily.    . metFORMIN (GLUCOPHAGE) 500 MG tablet Take 500 mg by mouth daily with breakfast. Dr Honor Junes    . mupirocin ointment (BACTROBAN) 2 % Apply 1 application topically 2 (two) times daily. PRN    . omeprazole (PRILOSEC) 40 MG capsule TAKE 1 CAPSULE(40 MG) BY MOUTH DAILY 90 capsule 1  . pregabalin (LYRICA) 200 MG capsule Take 1 capsule (200 mg total) by mouth 2 (two) times daily. 60 capsule 6  . sertraline (ZOLOFT) 50 MG tablet Take 0.5 tablets (25 mg total) by mouth daily. 45 tablet 1  . simvastatin (ZOCOR) 40 MG tablet Take 1 tablet (40 mg total) by mouth daily. 90 tablet 1  . traMADol (ULTRAM) 50 MG tablet TAKE 1 TABLET BY MOUTH EVERY 6 HOURS AS NEEDED FOR PAIN 120 tablet 0  . amoxicillin-clavulanate (AUGMENTIN) 875-125 MG tablet Take 1 tablet by mouth 2 (two) times daily. 20 tablet 0   Facility-Administered Medications Prior to Visit  Medication Dose Route Frequency Provider Last Rate Last Dose  . cyanocobalamin ((VITAMIN B-12)) injection 1,000 mcg  1,000 mcg Intramuscular Once Juline Patch, MD        PAST MEDICAL HISTORY: Past Medical History:  Diagnosis Date  . Chronic pain   . Depression   . Diabetes ([place])   . GERD (gastroesophageal reflux disease)   . Hyperlipemia   . Hypertension   . MS (multiple sclerosis) ([place])     PAST SURGICAL HISTORY: Past Surgical History:  Procedure Laterality Date  . COLECTOMY  06-2008  . COLONOSCOPY  2015   normal    FAMILY HISTORY: Family History  Problem Relation Age of Onset  . Cancer Mother   . Heart attack Father   . Heart attack  Brother   . COPD Brother     SOCIAL HISTORY: Social History   Socioeconomic History  . Marital status: Married    Spouse name: [name]  . Number of children: 2  . Years of education: GED  . Highest education level: Not on file  Occupational History    Comment: Disabled  Social Needs  . Financial resource strain: Not hard at all  . Food insecurity:    Worry: Never true    Inability: Never true  . Transportation needs:    Medical: No    Non-medical: No  Tobacco Use  . Smoking status: Former Smoker    Packs/day: 1.50    Years: 30.00    Pack years: 45.00    Types: Cigarettes    Last attempt to quit: 01/23/2006    Years since quitting: 12.1  . Smokeless tobacco: Never Used  . Tobacco comment: N/A  Substance and Sexual Activity  . Alcohol use: Yes    Alcohol/week: 0.0 oz    Comment: rare; maybe 2 beers a year  . Drug  use: No  . Sexual activity: Not Currently  Lifestyle  . Physical activity:    Days per week: 0 days    Minutes per session: 0 min  . Stress: Not at all  Relationships  . Social connections:    Talks on phone: More than three times a week    Gets together: Once a week    Attends religious service: More than 4 times per year    Active member of club or organization: No    Attends meetings of clubs or organizations: Never    Relationship status: Married  . Intimate partner violence:    Fear of current or ex partner: No    Emotionally abused: No    Physically abused: No    Forced sexual activity: No  Other Topics Concern  . Not on file  Social History Narrative   Patient is disabled.    Patient lives with his wife Taras Rask.    Patient has 2 children.         PHYSICAL EXAM  Vitals:   03/23/18 1254  BP: (!) 153/76  Pulse: 72  Weight: 238 lb 9.6 oz (108.2 kg)  Height: '5\' 10"'$  (1.778 m)   Body mass index is 34.24 kg/m.  Generalized: Well developed, obese male in no acute distress  Head: normocephalic and atraumatic,. Oropharynx benign    Neck: Supple,  Musculoskeletal: Mild left hemiparesis  Neurological examination   Mentation: Alert oriented to time, place, history taking. Attention span and concentration appropriate. Recent and remote memory intact.  Follows all commands speech and language fluent.   Cranial nerve II-XII: Fundoscopic exam reveals sharp disc margins.Pupils were equal round reactive to light extraocular movements were full, visual field were full on confrontational test. Facial sensation and strength were normal. hearing was intact to finger rubbing bilaterally. Uvula tongue midline. head turning and shoulder shrug were normal and symmetric.Tongue protrusion into cheek strength was normal. Motor: Mild left-sided weakness Sensory: Length dependent decreased  light touch, pinprick, and  Vibration,  to mid shin  Coordination: finger-nose-finger, heel-to-shin bilaterally, no dysmetria Reflexes: Hyperreflexia of both lower extremities, plantar responses were flexor bilaterally. Gait and Station:Need to push up to get up from seated position wide based, stiff, unsteady left hemi-circumferential gait . Ambulates with single-point cane DIAGNOSTIC DATA (LABS, IMAGING, TESTING) - I reviewed patient records, labs, notes, testing and imaging myself where available.  Lab Results  Component Value Date   WBC 4.8 03/15/2017   HGB 12.4 (L) 03/15/2017   HCT 37.1 (L) 03/15/2017   MCV 94 03/15/2017   PLT 177 03/15/2017      Component Value Date/Time   NA 138 09/29/2017 1017   NA 143 08/31/2013 0417   K 4.8 09/29/2017 1017   K 3.5 08/31/2013 0417   CL 99 09/29/2017 1017   CL 109 (H) 08/31/2013 0417   CO2 23 09/29/2017 1017   CO2 28 08/31/2013 0417   GLUCOSE 127 (H) 09/29/2017 1017   GLUCOSE 133 (H) 08/31/2013 0417   BUN 22 09/29/2017 1017   BUN 39 (H) 08/31/2013 0417   CREATININE 1.26 09/29/2017 1017   CREATININE 1.53 (H) 08/31/2013 0417   CALCIUM 10.4 (H) 09/29/2017 1017   CALCIUM 8.9 08/31/2013 0417   PROT  7.2 03/15/2017 1349   PROT 6.2 (L) 08/31/2013 0417   ALBUMIN 4.4 09/29/2017 1017   ALBUMIN 2.9 (L) 08/31/2013 0417   AST 19 03/15/2017 1349   AST 35 08/31/2013 0417   ALT 13 03/15/2017 1349  ALT 15 08/31/2013 0417   ALKPHOS 73 03/15/2017 1349   ALKPHOS 60 08/31/2013 0417   BILITOT 0.3 03/15/2017 1349   BILITOT 0.6 08/31/2013 0417   GFRNONAA 59 (L) 09/29/2017 1017   GFRNONAA 48 (L) 08/31/2013 0417   GFRAA 69 09/29/2017 1017   GFRAA 56 (L) 08/31/2013 0417   Lab Results  Component Value Date   CHOL 154 08/12/2017   HDL 28 (A) 08/12/2017   LDLCALC 93 08/12/2017   TRIG 168 (A) 08/12/2017   CHOLHDL 6.1 (H) 08/06/2016   Lab Results  Component Value Date   HGBA1C 5.9 08/12/2017    ASSESSMENT AND PLAN  67 y.o. year old male  with relapsing remitting multiple sclerosis, progressive worsening gait abnormality multifactorial due to multiple sclerosis diabetic peripheral neuropathy deconditioning.Last MRI of the brain 10/06/2017 results Abnormal MRI scan of brain without contrast showing multiple subcortical and periventricular nonenhancing lesions consistent with chronic demyelinating disease. No enhancing lesions are noted. Overall no significant change c/w MRI 08/15/2014.    Continue Betaseron injection every other day,  Will check labs today , CBC and CMP Use cane at all times, patient has significant gait abnormality along with chronic low back pain F/U in 6 months  Dennie Bible, [facility], [facility], APRN  [facility] [address] [place], [place] [address] [phone]

## 2018-03-22 NOTE — Progress Notes (Signed)
B 12

## 2018-03-23 ENCOUNTER — Encounter: Payer: Self-pay | Admitting: Nurse Practitioner

## 2018-03-23 ENCOUNTER — Ambulatory Visit (INDEPENDENT_AMBULATORY_CARE_PROVIDER_SITE_OTHER): Payer: Medicare Other | Admitting: Nurse Practitioner

## 2018-03-23 VITALS — BP 153/76 | HR 72 | Ht 70.0 in | Wt 238.6 lb

## 2018-03-23 DIAGNOSIS — E349 Endocrine disorder, unspecified: Secondary | ICD-10-CM | POA: Diagnosis not present

## 2018-03-23 DIAGNOSIS — G35 Multiple sclerosis: Secondary | ICD-10-CM | POA: Diagnosis not present

## 2018-03-23 DIAGNOSIS — G63 Polyneuropathy in diseases classified elsewhere: Secondary | ICD-10-CM

## 2018-03-23 DIAGNOSIS — R269 Unspecified abnormalities of gait and mobility: Secondary | ICD-10-CM | POA: Diagnosis not present

## 2018-03-23 DIAGNOSIS — Z5181 Encounter for therapeutic drug level monitoring: Secondary | ICD-10-CM | POA: Diagnosis not present

## 2018-03-23 NOTE — Patient Instructions (Signed)
Continue Betaseron injection every other day,  Will check labs today , CBC and CMP Use cane at all times, patient has significant gait abnormality along with chronic low back pain F/U in 6 months

## 2018-03-24 DIAGNOSIS — E119 Type 2 diabetes mellitus without complications: Secondary | ICD-10-CM | POA: Diagnosis not present

## 2018-03-24 LAB — CBC WITH DIFFERENTIAL/PLATELET
BASOS ABS: 0.1 10*3/uL (ref 0.0–0.2)
Basos: 1 %
EOS (ABSOLUTE): 0.2 10*3/uL (ref 0.0–0.4)
Eos: 4 %
HEMATOCRIT: 36 % — AB (ref 37.5–51.0)
HEMOGLOBIN: 11.8 g/dL — AB (ref 13.0–17.7)
IMMATURE GRANULOCYTES: 0 %
Immature Grans (Abs): 0 10*3/uL (ref 0.0–0.1)
LYMPHS: 29 %
Lymphocytes Absolute: 1.1 10*3/uL (ref 0.7–3.1)
MCH: 31 pg (ref 26.6–33.0)
MCHC: 32.8 g/dL (ref 31.5–35.7)
MCV: 95 fL (ref 79–97)
MONOCYTES: 12 %
Monocytes Absolute: 0.5 10*3/uL (ref 0.1–0.9)
NEUTROS PCT: 54 %
Neutrophils Absolute: 2.1 10*3/uL (ref 1.4–7.0)
Platelets: 175 10*3/uL (ref 150–450)
RBC: 3.81 x10E6/uL — AB (ref 4.14–5.80)
RDW: 14.9 % (ref 12.3–15.4)
WBC: 3.9 10*3/uL (ref 3.4–10.8)

## 2018-03-24 LAB — COMPREHENSIVE METABOLIC PANEL
ALK PHOS: 64 IU/L (ref 39–117)
ALT: 11 IU/L (ref 0–44)
AST: 16 IU/L (ref 0–40)
Albumin/Globulin Ratio: 1.4 (ref 1.2–2.2)
Albumin: 4.4 g/dL (ref 3.6–4.8)
BUN/Creatinine Ratio: 17 (ref 10–24)
BUN: 22 mg/dL (ref 8–27)
Bilirubin Total: 0.3 mg/dL (ref 0.0–1.2)
CO2: 23 mmol/L (ref 20–29)
CREATININE: 1.29 mg/dL — AB (ref 0.76–1.27)
Calcium: 9.9 mg/dL (ref 8.6–10.2)
Chloride: 102 mmol/L (ref 96–106)
GFR calc Af Amer: 67 mL/min/{1.73_m2} (ref >59)
GFR calc non Af Amer: 58 mL/min/{1.73_m2} — ABNORMAL LOW (ref >59)
GLUCOSE: 146 mg/dL — AB (ref 65–99)
Globulin, Total: 3.1 g/dL (ref 1.5–4.5)
Potassium: 4.4 mmol/L (ref 3.5–5.2)
Sodium: 142 mmol/L (ref 134–144)
Total Protein: 7.5 g/dL (ref 6.0–8.5)

## 2018-03-25 ENCOUNTER — Other Ambulatory Visit: Payer: Self-pay | Admitting: Family Medicine

## 2018-03-25 ENCOUNTER — Other Ambulatory Visit: Payer: Self-pay | Admitting: Neurology

## 2018-03-27 NOTE — Telephone Encounter (Signed)
Rx sent electronically by Dr. Krista Blue

## 2018-03-27 NOTE — Telephone Encounter (Signed)
Rx registry checked. Last fill date is 02-08-18 for #120. Last OV 03-23-18 with Cecille Rubin, NP.

## 2018-03-28 ENCOUNTER — Telehealth: Payer: Self-pay | Admitting: *Deleted

## 2018-03-28 NOTE — Telephone Encounter (Signed)
Spoke with patient and informed him that his labs show a mildly elevated creatinine and low hemoglobin of 11.8. He stated his Hgb always runs low. This RN advised she will fax a copy to his PCP for review. He verbalized understanding, appreciation. Labs successfully faxed to PCP.

## 2018-04-01 ENCOUNTER — Other Ambulatory Visit: Payer: Self-pay | Admitting: Family Medicine

## 2018-04-01 DIAGNOSIS — F331 Major depressive disorder, recurrent, moderate: Secondary | ICD-10-CM

## 2018-04-04 ENCOUNTER — Ambulatory Visit (INDEPENDENT_AMBULATORY_CARE_PROVIDER_SITE_OTHER): Payer: Medicare Other

## 2018-04-04 DIAGNOSIS — D519 Vitamin B12 deficiency anemia, unspecified: Secondary | ICD-10-CM | POA: Diagnosis not present

## 2018-04-04 MED ORDER — CYANOCOBALAMIN 1000 MCG/ML IJ SOLN
1000.0000 ug | Freq: Once | INTRAMUSCULAR | Status: AC
  Administered 2018-04-04: 1000 ug via INTRAMUSCULAR

## 2018-04-04 NOTE — Progress Notes (Signed)
Received B12

## 2018-04-06 ENCOUNTER — Other Ambulatory Visit: Payer: Self-pay | Admitting: Family Medicine

## 2018-04-06 DIAGNOSIS — G63 Polyneuropathy in diseases classified elsewhere: Principal | ICD-10-CM

## 2018-04-06 DIAGNOSIS — G609 Hereditary and idiopathic neuropathy, unspecified: Secondary | ICD-10-CM

## 2018-04-06 DIAGNOSIS — E349 Endocrine disorder, unspecified: Secondary | ICD-10-CM

## 2018-04-18 ENCOUNTER — Ambulatory Visit (INDEPENDENT_AMBULATORY_CARE_PROVIDER_SITE_OTHER): Payer: Medicare Other

## 2018-04-18 DIAGNOSIS — D519 Vitamin B12 deficiency anemia, unspecified: Secondary | ICD-10-CM

## 2018-04-18 MED ORDER — CYANOCOBALAMIN 1000 MCG/ML IJ SOLN
1000.0000 ug | Freq: Once | INTRAMUSCULAR | Status: AC
Start: 2018-04-18 — End: 2018-04-18
  Administered 2018-04-18: 1000 ug via INTRAMUSCULAR

## 2018-04-24 DIAGNOSIS — I270 Primary pulmonary hypertension: Secondary | ICD-10-CM | POA: Diagnosis not present

## 2018-04-24 DIAGNOSIS — E1129 Type 2 diabetes mellitus with other diabetic kidney complication: Secondary | ICD-10-CM | POA: Diagnosis not present

## 2018-04-24 DIAGNOSIS — R609 Edema, unspecified: Secondary | ICD-10-CM | POA: Diagnosis not present

## 2018-04-24 DIAGNOSIS — N183 Chronic kidney disease, stage 3 (moderate): Secondary | ICD-10-CM | POA: Diagnosis not present

## 2018-04-25 ENCOUNTER — Ambulatory Visit (INDEPENDENT_AMBULATORY_CARE_PROVIDER_SITE_OTHER): Payer: Medicare Other | Admitting: Family Medicine

## 2018-04-25 ENCOUNTER — Encounter: Payer: Self-pay | Admitting: Family Medicine

## 2018-04-25 VITALS — BP 130/80 | HR 64 | Ht 70.0 in | Wt 236.0 lb

## 2018-04-25 DIAGNOSIS — J0100 Acute maxillary sinusitis, unspecified: Secondary | ICD-10-CM | POA: Diagnosis not present

## 2018-04-25 MED ORDER — AMOXICILLIN-POT CLAVULANATE 875-125 MG PO TABS: 1.0000 | ORAL_TABLET | Freq: Two times a day (BID) | ORAL | 0 refills | Status: DC

## 2018-04-25 NOTE — Progress Notes (Signed)
Name: Jeremy [name]   MRN: [id number]    DOB: 07/16/1951   Date:04/25/2018       Progress Note  Subjective  Chief Complaint  Chief Complaint  Patient presents with  . Sinusitis    sore throat, cong, cough- yellow production    Sinusitis  This is a new problem. The current episode started yesterday. The problem has been gradually worsening since onset. There has been no fever. The pain is mild. Associated symptoms include congestion, coughing, headaches, sinus pressure and a sore throat. Pertinent negatives include no chills, diaphoresis, ear pain, hoarse voice, neck pain, shortness of breath, sneezing or swollen glands. Past treatments include nothing.    No problem-specific Assessment & Plan notes found for this encounter.   Past Medical History:  Diagnosis Date  . Chronic pain   . Depression   . Diabetes ([place])   . GERD (gastroesophageal reflux disease)   . Hyperlipemia   . Hypertension   . MS (multiple sclerosis) ([place])     Past Surgical History:  Procedure Laterality Date  . COLECTOMY  06-2008  . COLONOSCOPY  2015   normal    Family History  Problem Relation Age of Onset  . Cancer Mother   . Heart attack Father   . Heart attack Brother   . COPD Brother     Social History   Socioeconomic History  . Marital status: Married    Spouse name: [name]  . Number of children: 2  . Years of education: GED  . Highest education level: Not on file  Occupational History    Comment: Disabled  Social Needs  . Financial resource strain: Not hard at all  . Food insecurity:    Worry: Never true    Inability: Never true  . Transportation needs:    Medical: No    Non-medical: No  Tobacco Use  . Smoking status: Former Smoker    Packs/day: 1.50    Years: 30.00    Pack years: 45.00    Types: Cigarettes    Last attempt to quit: 01/23/2006    Years since quitting: 12.2  . Smokeless tobacco: Never Used  . Tobacco comment: N/A  Substance and Sexual Activity  . Alcohol use:  Yes    Alcohol/week: 0.0 oz    Comment: rare; maybe 2 beers a year  . Drug use: No  . Sexual activity: Not Currently  Lifestyle  . Physical activity:    Days per week: 0 days    Minutes per session: 0 min  . Stress: Not at all  Relationships  . Social connections:    Talks on phone: More than three times a week    Gets together: Once a week    Attends religious service: More than 4 times per year    Active member of club or organization: No    Attends meetings of clubs or organizations: Never    Relationship status: Married  . Intimate partner violence:    Fear of current or ex partner: No    Emotionally abused: No    Physically abused: No    Forced sexual activity: No  Other Topics Concern  . Not on file  Social History Narrative   Patient is disabled.    Patient lives with his wife Dawayne Ohair.    Patient has 2 children.        Allergies  Allergen Reactions  . Betadine [Povidone Iodine]     Outpatient Medications Prior to Visit  Medication Sig  Dispense Refill  . aspirin 81 MG tablet Take 81 mg by mouth daily.    . cyanocobalamin (,VITAMIN B-12,) 1000 MCG/ML injection Inject into the muscle.    . docusate sodium (COLACE) 50 MG capsule Take 50 mg by mouth daily. otc     . FEROSUL 325 (65 Fe) MG tablet TAKE 1 TABLET BY MOUTH THREE TIMES DAILY 270 tablet 0  . furosemide (LASIX) 40 MG tablet Take 1 tablet (40 mg total) by mouth 2 (two) times daily. 180 tablet 1  . gemfibrozil (LOPID) 600 MG tablet Take 1 tablet (600 mg total) by mouth 2 (two) times daily before a meal. 180 tablet 1  . Insulin Glargine (LANTUS) 100 UNIT/ML Solostar Pen Inject 36 Units into the skin daily. Dr Honor Junes    . Interferon Beta-1b (BETASERON) 0.3 MG KIT injection Inject subcutaneously 1  syringe every other day 42 each 6  . loratadine (CLARITIN) 10 MG tablet Take 10 mg by mouth daily.    . metFORMIN (GLUCOPHAGE) 500 MG tablet Take 500 mg by mouth daily with breakfast. Dr Honor Junes    .  mupirocin ointment (BACTROBAN) 2 % Apply 1 application topically 2 (two) times daily. PRN    . omeprazole (PRILOSEC) 40 MG capsule TAKE 1 CAPSULE(40 MG) BY MOUTH DAILY 90 capsule 1  . pregabalin (LYRICA) 200 MG capsule Take 1 capsule (200 mg total) by mouth 2 (two) times daily. 60 capsule 6  . sertraline (ZOLOFT) 50 MG tablet TAKE 1/2 TABLET(25 MG) BY MOUTH DAILY 45 tablet 0  . simvastatin (ZOCOR) 40 MG tablet Take 1 tablet (40 mg total) by mouth daily. 90 tablet 1  . traMADol (ULTRAM) 50 MG tablet TAKE 1 TABLET BY MOUTH EVERY 6 HOURS AS NEEDED FOR PAIN 120 tablet 0   Facility-Administered Medications Prior to Visit  Medication Dose Route Frequency Provider Last Rate Last Dose  . cyanocobalamin ((VITAMIN B-12)) injection 1,000 mcg  1,000 mcg Intramuscular Once Juline Patch, MD        Review of Systems  Constitutional: Negative for chills, diaphoresis, fever, malaise/fatigue and weight loss.  HENT: Positive for congestion, sinus pressure and sore throat. Negative for ear discharge, ear pain, hoarse voice and sneezing.   Eyes: Negative for blurred vision.  Respiratory: Positive for cough. Negative for sputum production, shortness of breath and wheezing.   Cardiovascular: Negative for chest pain, palpitations and leg swelling.  Gastrointestinal: Negative for abdominal pain, blood in stool, constipation, diarrhea, heartburn, melena and nausea.  Genitourinary: Negative for dysuria, frequency, hematuria and urgency.  Musculoskeletal: Negative for back pain, joint pain, myalgias and neck pain.  Skin: Negative for rash.  Neurological: Positive for headaches. Negative for dizziness, tingling, sensory change and focal weakness.  Endo/Heme/Allergies: Negative for environmental allergies and polydipsia. Does not bruise/bleed easily.  Psychiatric/Behavioral: Negative for depression and suicidal ideas. The patient is not nervous/anxious and does not have insomnia.      Objective  Vitals:    04/25/18 1449  BP: 130/80  Pulse: 64  Weight: 236 lb (107 kg)  Height: 5' 10"  (1.778 m)    Physical Exam  Constitutional: He is oriented to person, place, and time.  HENT:  Head: Normocephalic.  Right Ear: Tympanic membrane, external ear and ear canal normal.  Left Ear: Tympanic membrane, external ear and ear canal normal.  Nose: Mucosal edema present. Right sinus exhibits maxillary sinus tenderness. Right sinus exhibits no frontal sinus tenderness. Left sinus exhibits maxillary sinus tenderness. Left sinus exhibits no frontal sinus tenderness.  Mouth/Throat: Oropharynx is clear and moist.  Eyes: Pupils are equal, round, and reactive to light. Conjunctivae and EOM are normal. Right eye exhibits no discharge. Left eye exhibits no discharge. No scleral icterus.  Neck: Normal range of motion. Neck supple. No JVD present. No tracheal deviation present. No thyromegaly present.  Cardiovascular: Normal rate, regular rhythm, normal heart sounds and intact distal pulses. Exam reveals no gallop and no friction rub.  No murmur heard. Pulmonary/Chest: Breath sounds normal. No respiratory distress. He has no wheezes. He has no rales.  Abdominal: Soft. Bowel sounds are normal. He exhibits no mass. There is no hepatosplenomegaly. There is no tenderness. There is no rebound, no guarding and no CVA tenderness.  Musculoskeletal: Normal range of motion. He exhibits no edema or tenderness.  Lymphadenopathy:    He has no cervical adenopathy.  Neurological: He is alert and oriented to person, place, and time. He has normal strength and normal reflexes. No cranial nerve deficit.  Skin: Skin is warm. No rash noted.  Nursing note and vitals reviewed.     Assessment & Plan  Problem List Items Addressed This Visit    None    Visit Diagnoses    Acute maxillary sinusitis, recurrence not specified    -  Primary   prescribed Augmentin   Relevant Medications   amoxicillin-clavulanate (AUGMENTIN) 875-125 MG  tablet      Meds ordered this encounter  Medications  . amoxicillin-clavulanate (AUGMENTIN) 875-125 MG tablet    Sig: Take 1 tablet by mouth 2 (two) times daily.    Dispense:  20 tablet    Refill:  0      Dr. Adriene Knipfer [facility]  04/25/18

## 2018-05-02 ENCOUNTER — Ambulatory Visit (INDEPENDENT_AMBULATORY_CARE_PROVIDER_SITE_OTHER): Payer: Medicare Other

## 2018-05-02 DIAGNOSIS — D518 Other vitamin B12 deficiency anemias: Secondary | ICD-10-CM | POA: Diagnosis not present

## 2018-05-02 MED ORDER — CYANOCOBALAMIN 1000 MCG/ML IJ SOLN
1000.0000 ug | Freq: Once | INTRAMUSCULAR | Status: AC
  Administered 2018-05-02: 1000 ug via INTRAMUSCULAR

## 2018-05-03 DIAGNOSIS — B351 Tinea unguium: Secondary | ICD-10-CM | POA: Diagnosis not present

## 2018-05-03 DIAGNOSIS — E1142 Type 2 diabetes mellitus with diabetic polyneuropathy: Secondary | ICD-10-CM | POA: Diagnosis not present

## 2018-05-04 DIAGNOSIS — E1129 Type 2 diabetes mellitus with other diabetic kidney complication: Secondary | ICD-10-CM | POA: Diagnosis not present

## 2018-05-04 DIAGNOSIS — R600 Localized edema: Secondary | ICD-10-CM | POA: Diagnosis not present

## 2018-05-04 DIAGNOSIS — N183 Chronic kidney disease, stage 3 (moderate): Secondary | ICD-10-CM | POA: Diagnosis not present

## 2018-05-08 ENCOUNTER — Other Ambulatory Visit: Payer: Self-pay | Admitting: Neurology

## 2018-05-08 ENCOUNTER — Other Ambulatory Visit: Payer: Self-pay | Admitting: Family Medicine

## 2018-05-16 ENCOUNTER — Ambulatory Visit (INDEPENDENT_AMBULATORY_CARE_PROVIDER_SITE_OTHER): Payer: Medicare Other

## 2018-05-16 DIAGNOSIS — E538 Deficiency of other specified B group vitamins: Secondary | ICD-10-CM | POA: Diagnosis not present

## 2018-05-16 MED ORDER — CYANOCOBALAMIN 1000 MCG/ML IJ SOLN
1000.0000 ug | Freq: Once | INTRAMUSCULAR | Status: AC
  Administered 2018-05-16: 1000 ug via INTRAMUSCULAR

## 2018-05-18 ENCOUNTER — Other Ambulatory Visit: Payer: Self-pay | Admitting: Family Medicine

## 2018-05-18 DIAGNOSIS — K219 Gastro-esophageal reflux disease without esophagitis: Secondary | ICD-10-CM

## 2018-05-30 ENCOUNTER — Ambulatory Visit (INDEPENDENT_AMBULATORY_CARE_PROVIDER_SITE_OTHER): Payer: Medicare Other

## 2018-05-30 DIAGNOSIS — D519 Vitamin B12 deficiency anemia, unspecified: Secondary | ICD-10-CM | POA: Diagnosis not present

## 2018-05-30 MED ORDER — CYANOCOBALAMIN 1000 MCG/ML IJ SOLN
1000.0000 ug | Freq: Once | INTRAMUSCULAR | Status: AC
  Administered 2018-05-30: 1000 ug via INTRAMUSCULAR

## 2018-06-13 ENCOUNTER — Ambulatory Visit (INDEPENDENT_AMBULATORY_CARE_PROVIDER_SITE_OTHER): Payer: Medicare Other

## 2018-06-13 DIAGNOSIS — E538 Deficiency of other specified B group vitamins: Secondary | ICD-10-CM | POA: Diagnosis not present

## 2018-06-13 MED ORDER — CYANOCOBALAMIN 1000 MCG/ML IJ SOLN
1000.0000 ug | Freq: Once | INTRAMUSCULAR | Status: AC
Start: 2018-06-13 — End: 2018-06-13
  Administered 2018-06-13: 1000 ug via INTRAMUSCULAR

## 2018-06-14 ENCOUNTER — Other Ambulatory Visit: Payer: Self-pay | Admitting: Diagnostic Neuroimaging

## 2018-06-27 ENCOUNTER — Ambulatory Visit (INDEPENDENT_AMBULATORY_CARE_PROVIDER_SITE_OTHER): Payer: Medicare Other

## 2018-06-27 DIAGNOSIS — E538 Deficiency of other specified B group vitamins: Secondary | ICD-10-CM | POA: Diagnosis not present

## 2018-06-27 MED ORDER — CYANOCOBALAMIN 1000 MCG/ML IJ SOLN
1000.0000 ug | Freq: Once | INTRAMUSCULAR | Status: AC
  Administered 2018-06-27: 1000 ug via INTRAMUSCULAR

## 2018-06-30 ENCOUNTER — Other Ambulatory Visit: Payer: Self-pay | Admitting: Family Medicine

## 2018-06-30 DIAGNOSIS — Z794 Long term (current) use of insulin: Secondary | ICD-10-CM | POA: Diagnosis not present

## 2018-06-30 DIAGNOSIS — E785 Hyperlipidemia, unspecified: Secondary | ICD-10-CM

## 2018-06-30 DIAGNOSIS — E1142 Type 2 diabetes mellitus with diabetic polyneuropathy: Secondary | ICD-10-CM | POA: Diagnosis not present

## 2018-06-30 DIAGNOSIS — F331 Major depressive disorder, recurrent, moderate: Secondary | ICD-10-CM

## 2018-06-30 LAB — HEMOGLOBIN A1C: Hemoglobin A1C: 5.7

## 2018-07-06 ENCOUNTER — Encounter: Payer: Self-pay | Admitting: Family Medicine

## 2018-07-06 ENCOUNTER — Other Ambulatory Visit: Payer: Self-pay

## 2018-07-06 ENCOUNTER — Ambulatory Visit (INDEPENDENT_AMBULATORY_CARE_PROVIDER_SITE_OTHER): Payer: Medicare Other | Admitting: Family Medicine

## 2018-07-06 VITALS — BP 130/70 | HR 76 | Ht 70.0 in | Wt 237.0 lb

## 2018-07-06 DIAGNOSIS — Z23 Encounter for immunization: Secondary | ICD-10-CM | POA: Diagnosis not present

## 2018-07-06 DIAGNOSIS — E782 Mixed hyperlipidemia: Secondary | ICD-10-CM | POA: Diagnosis not present

## 2018-07-06 DIAGNOSIS — R69 Illness, unspecified: Secondary | ICD-10-CM | POA: Diagnosis not present

## 2018-07-06 DIAGNOSIS — D519 Vitamin B12 deficiency anemia, unspecified: Secondary | ICD-10-CM

## 2018-07-06 DIAGNOSIS — D649 Anemia, unspecified: Secondary | ICD-10-CM | POA: Diagnosis not present

## 2018-07-06 DIAGNOSIS — E538 Deficiency of other specified B group vitamins: Secondary | ICD-10-CM

## 2018-07-06 DIAGNOSIS — L03115 Cellulitis of right lower limb: Secondary | ICD-10-CM | POA: Diagnosis not present

## 2018-07-06 DIAGNOSIS — R601 Generalized edema: Secondary | ICD-10-CM | POA: Diagnosis not present

## 2018-07-06 DIAGNOSIS — F331 Major depressive disorder, recurrent, moderate: Secondary | ICD-10-CM

## 2018-07-06 DIAGNOSIS — G63 Polyneuropathy in diseases classified elsewhere: Principal | ICD-10-CM

## 2018-07-06 DIAGNOSIS — G609 Hereditary and idiopathic neuropathy, unspecified: Secondary | ICD-10-CM

## 2018-07-06 DIAGNOSIS — E349 Endocrine disorder, unspecified: Secondary | ICD-10-CM

## 2018-07-06 DIAGNOSIS — S90511A Abrasion, right ankle, initial encounter: Secondary | ICD-10-CM

## 2018-07-06 MED ORDER — GEMFIBROZIL 600 MG PO TABS: 600.0000 mg | ORAL_TABLET | Freq: Two times a day (BID) | ORAL | 1 refills | Status: DC

## 2018-07-06 MED ORDER — SERTRALINE HCL 50 MG PO TABS: ORAL_TABLET | ORAL | 1 refills | Status: DC

## 2018-07-06 MED ORDER — MUPIROCIN 2 % EX OINT: 1.0000 "application " | TOPICAL_OINTMENT | Freq: Two times a day (BID) | CUTANEOUS | 0 refills | Status: DC

## 2018-07-06 MED ORDER — FERROUS SULFATE 325 (65 FE) MG PO TABS: 325.0000 mg | ORAL_TABLET | Freq: Three times a day (TID) | ORAL | 1 refills | Status: DC

## 2018-07-06 MED ORDER — SIMVASTATIN 40 MG PO TABS: 40.0000 mg | ORAL_TABLET | Freq: Every day | ORAL | 1 refills | Status: DC

## 2018-07-06 MED ORDER — AMOXICILLIN-POT CLAVULANATE 875-125 MG PO TABS: 1.0000 | ORAL_TABLET | Freq: Two times a day (BID) | ORAL | 0 refills | Status: DC

## 2018-07-06 MED ORDER — FUROSEMIDE 40 MG PO TABS: 40.0000 mg | ORAL_TABLET | Freq: Two times a day (BID) | ORAL | 1 refills | Status: DC

## 2018-07-06 MED ORDER — PREGABALIN 200 MG PO CAPS: 200.0000 mg | ORAL_CAPSULE | Freq: Two times a day (BID) | ORAL | 6 refills | Status: DC

## 2018-07-06 NOTE — Assessment & Plan Note (Signed)
Stable on meds- refilled simvastatin and Gemfibrozil/ draw lipid and hepatic

## 2018-07-06 NOTE — Assessment & Plan Note (Signed)
Stable on meds- continue sertraline

## 2018-07-06 NOTE — Assessment & Plan Note (Signed)
Continue B12 injections.   

## 2018-07-06 NOTE — Progress Notes (Signed)
Name: Jeremy [name]   MRN: [id number]    DOB: [birth year]   Date:07/06/2018       Progress Note  Subjective  Chief Complaint  Chief Complaint  Patient presents with  . Depression  . Peripheral Neuropathy  . Leg Swelling    takes furosemide  . Hyperlipidemia  . Anemia    Patient "run up under a fence" sustained abrasion of   Depression       The patient presents with depression.  This is a chronic problem.  The current episode started more than 1 year ago.   The onset quality is gradual.   The problem occurs intermittently.  The problem has been gradually improving (stable) since onset.  Associated symptoms include no decreased concentration, no fatigue, no helplessness, no hopelessness, does not have insomnia, not irritable, no restlessness, no decreased interest, no appetite change, no body aches, no myalgias, no headaches, no indigestion, not sad and no suicidal ideas.     Exacerbated by: medical issues.  Past treatments include SSRIs - Selective serotonin reuptake inhibitors.  Compliance with treatment is good.  Previous treatment provided mild relief.  Past medical history includes depression.     Pertinent negatives include no hypothyroidism. Hyperlipidemia  This is a chronic problem. The problem is controlled. He has no history of chronic renal disease, diabetes, hypothyroidism, liver disease, obesity or nephrotic syndrome. Pertinent negatives include no chest pain, focal sensory loss, focal weakness, leg pain, myalgias or shortness of breath. Current antihyperlipidemic treatment includes fibric acid derivatives. The current treatment provides mild improvement of lipids. There are no compliance problems.   Anemia  Presents for follow-up visit. There has been no abdominal pain, anorexia, bruising/bleeding easily, confusion, fever, leg swelling, light-headedness, malaise/fatigue, pallor, palpitations, paresthesias or pica. There is no history of chronic renal disease or hypothyroidism. There  are no compliance problems.   Neurologic Problem  The patient's pertinent negatives include no altered mental status, clumsiness, focal sensory loss, focal weakness, loss of balance, memory loss, near-syncope, slurred speech, syncope, visual change or weakness. This is a chronic problem. The problem has been waxing and waning since onset. There was no focality noted. Pertinent negatives include no abdominal pain, auditory change, aura, back pain, chest pain, confusion, fatigue, fever, headaches, light-headedness, palpitations or shortness of breath. The treatment provided mild relief. There is no history of liver disease.    Hyperlipidemia Stable on meds- refilled simvastatin and Gemfibrozil/ draw lipid and hepatic  Depression Stable on meds- continue sertraline  B12 deficiency anemia Continue B12 injections   Past Medical History:  Diagnosis Date  . Chronic pain   . Depression   . Diabetes ([place])   . GERD (gastroesophageal reflux disease)   . Hyperlipemia   . Hypertension   . MS (multiple sclerosis) ([place])     Past Surgical History:  Procedure Laterality Date  . COLECTOMY  06-2008  . COLONOSCOPY  2015   normal    Family History  Problem Relation Age of Onset  . Cancer Mother   . Heart attack Father   . Heart attack Brother   . COPD Brother     Social History   Socioeconomic History  . Marital status: Married    Spouse name: [name]  . Number of children: 2  . Years of education: GED  . Highest education level: Not on file  Occupational History    Comment: Disabled  Social Needs  . Financial resource strain: Not hard at all  . Food insecurity:  Worry: Never true    Inability: Never true  . Transportation needs:    Medical: No    Non-medical: No  Tobacco Use  . Smoking status: Former Smoker    Packs/day: 1.50    Years: 30.00    Pack years: 45.00    Types: Cigarettes    Last attempt to quit: 01/23/2006    Years since quitting: 12.4  . Smokeless tobacco:  Never Used  . Tobacco comment: N/A  Substance and Sexual Activity  . Alcohol use: Yes    Alcohol/week: 0.0 standard drinks    Comment: rare; maybe 2 beers a year  . Drug use: No  . Sexual activity: Not Currently  Lifestyle  . Physical activity:    Days per week: 0 days    Minutes per session: 0 min  . Stress: Not at all  Relationships  . Social connections:    Talks on phone: More than three times a week    Gets together: Once a week    Attends religious service: More than 4 times per year    Active member of club or organization: No    Attends meetings of clubs or organizations: Never    Relationship status: Married  . Intimate partner violence:    Fear of current or ex partner: No    Emotionally abused: No    Physically abused: No    Forced sexual activity: No  Other Topics Concern  . Not on file  Social History Narrative   Patient is disabled.    Patient lives with his wife Gibril Mastro.    Patient has 2 children.        Allergies  Allergen Reactions  . Betadine [Povidone Iodine]     Outpatient Medications Prior to Visit  Medication Sig Dispense Refill  . aspirin 81 MG tablet Take 81 mg by mouth daily.    . cyanocobalamin (,VITAMIN B-12,) 1000 MCG/ML injection Inject into the muscle.    . docusate sodium (COLACE) 50 MG capsule Take 50 mg by mouth daily. otc     . Insulin Glargine (LANTUS) 100 UNIT/ML Solostar Pen Inject 36 Units into the skin daily. Dr Honor Junes    . Interferon Beta-1b (BETASERON) 0.3 MG KIT injection Inject subcutaneously 1  syringe every other day 42 each 6  . loratadine (CLARITIN) 10 MG tablet Take 10 mg by mouth daily.    . metFORMIN (GLUCOPHAGE) 500 MG tablet Take 500 mg by mouth daily with breakfast. Dr Honor Junes    . mupirocin ointment (BACTROBAN) 2 % Apply 1 application topically 2 (two) times daily. PRN    . omeprazole (PRILOSEC) 40 MG capsule TAKE 1 CAPSULE(40 MG) BY MOUTH DAILY 90 capsule 0  . pregabalin (LYRICA) 200 MG capsule Take 1  capsule (200 mg total) by mouth 2 (two) times daily. 60 capsule 6  . traMADol (ULTRAM) 50 MG tablet TAKE 1 TABLET BY MOUTH EVERY 6 HOURS AS NEEDED FOR PAIN 120 tablet 0  . FEROSUL 325 (65 Fe) MG tablet TAKE 1 TABLET BY MOUTH THREE TIMES DAILY 270 tablet 0  . furosemide (LASIX) 40 MG tablet Take 1 tablet (40 mg total) by mouth 2 (two) times daily. 180 tablet 1  . gemfibrozil (LOPID) 600 MG tablet Take 1 tablet (600 mg total) by mouth 2 (two) times daily before a meal. 180 tablet 1  . sertraline (ZOLOFT) 50 MG tablet TAKE 1/2 TABLET(25 MG) BY MOUTH DAILY 45 tablet 0  . simvastatin (ZOCOR) 40 MG tablet TAKE  1 TABLET BY MOUTH DAILY 90 tablet 0  . amoxicillin-clavulanate (AUGMENTIN) 875-125 MG tablet Take 1 tablet by mouth 2 (two) times daily. 20 tablet 0  . gemfibrozil (LOPID) 600 MG tablet TAKE 1 TABLET(600 MG) BY MOUTH TWICE DAILY BEFORE A MEAL 180 tablet 0   Facility-Administered Medications Prior to Visit  Medication Dose Route Frequency Provider Last Rate Last Dose  . cyanocobalamin ((VITAMIN B-12)) injection 1,000 mcg  1,000 mcg Intramuscular Once Juline Patch, MD        Review of Systems  Constitutional: Negative for appetite change, fatigue, fever and malaise/fatigue.  Respiratory: Negative for shortness of breath.   Cardiovascular: Negative for chest pain, palpitations and near-syncope.  Gastrointestinal: Negative for abdominal pain and anorexia.  Musculoskeletal: Negative for back pain and myalgias.  Skin: Negative for pallor.  Neurological: Negative for focal weakness, syncope, weakness, light-headedness, headaches, paresthesias and loss of balance.  Endo/Heme/Allergies: Does not bruise/bleed easily.  Psychiatric/Behavioral: Positive for depression. Negative for confusion, decreased concentration, memory loss and suicidal ideas. The patient does not have insomnia.      Objective  Vitals:   07/06/18 1009  BP: 130/70  Pulse: 76  Weight: 237 lb (107.5 kg)  Height: _0   (1.778 m)    Physical Exam  Constitutional: He is oriented to person, place, and time. He appears well-developed and well-nourished. He is not irritable.  HENT:  Head: Normocephalic.  Right Ear: External ear normal.  Left Ear: External ear normal.  Nose: Nose normal.  Mouth/Throat: Oropharynx is clear and moist.  Eyes: Pupils are equal, round, and reactive to light. Conjunctivae and EOM are normal. Right eye exhibits no discharge. Left eye exhibits no discharge. No scleral icterus.  Neck: Normal range of motion. Neck supple. No JVD present. No tracheal deviation present. No thyromegaly present.  Cardiovascular: Normal rate, regular rhythm, normal heart sounds and intact distal pulses. Exam reveals no gallop and no friction rub.  No murmur heard. Pulmonary/Chest: Breath sounds normal. No respiratory distress. He has no wheezes. He has no rales.  Abdominal: Soft. Bowel sounds are normal. He exhibits no mass. There is no hepatosplenomegaly. There is no tenderness. There is no rebound, no guarding and no CVA tenderness.  Musculoskeletal: Normal range of motion. He exhibits no edema or tenderness.  Lymphadenopathy:    He has no cervical adenopathy.  Neurological: He is alert and oriented to person, place, and time. He has normal strength and normal reflexes. No cranial nerve deficit.  Skin: Skin is warm. Abrasion noted. No rash noted. There is erythema.     Right lower leg 2 abrasions with avulsed areas/ 0.5 diameters each/ With surrounding area of erythema 3x4 cm. anterior aspect of right lower leg.  Nursing note and vitals reviewed.     Assessment & Plan  Problem List Items Addressed This Visit      Other   B12 deficiency   B12 deficiency anemia    Continue B12 injections      Relevant Medications   ferrous sulfate (FEROSUL) 325 (65 FE) MG tablet   Hyperlipidemia    Stable on meds- refilled simvastatin and Gemfibrozil/ draw lipid and hepatic      Relevant Medications    furosemide (LASIX) 40 MG tablet   gemfibrozil (LOPID) 600 MG tablet   simvastatin (ZOCOR) 40 MG tablet   Other Relevant Orders   Lipid Panel With LDL/HDL Ratio   Depression - Primary    Stable on meds- continue sertraline  Relevant Medications   sertraline (ZOLOFT) 50 MG tablet    Other Visit Diagnoses    Generalized edema       refilled lasix- stable on med   Relevant Medications   furosemide (LASIX) 40 MG tablet   Abrasion of ankle without infection, right, initial encounter       Gave TDAP and Bactroban ointment   Relevant Orders   Tdap vaccine greater than or equal to 7yo IM (Completed)   Cellulitis of right lower extremity       sent in Augmentin   Relevant Medications   amoxicillin-clavulanate (AUGMENTIN) 875-125 MG tablet   Other Relevant Orders   CBC w/Diff/Platelet   Tdap vaccine greater than or equal to 7yo IM (Completed)   Anemia, unspecified type       stable on ferrous sulfate- refill med and draw cbc   Relevant Medications   ferrous sulfate (FEROSUL) 325 (65 FE) MG tablet   Taking medication for chronic disease       draw a hepatic function panel   Relevant Medications   mupirocin ointment (BACTROBAN) 2 %   Other Relevant Orders   Hepatic function panel   Need for diphtheria-tetanus-pertussis (Tdap) vaccine       TDAP administered   Relevant Orders   Tdap vaccine greater than or equal to 7yo IM (Completed)   Flu vaccine need       flu vax administered   Relevant Orders   Flu vaccine HIGH DOSE PF (Fluzone High dose) (Completed)      Meds ordered this encounter  Medications  . ferrous sulfate (FEROSUL) 325 (65 FE) MG tablet    Sig: Take 1 tablet (325 mg total) by mouth 3 (three) times daily.    Dispense:  270 tablet    Refill:  1  . furosemide (LASIX) 40 MG tablet    Sig: Take 1 tablet (40 mg total) by mouth 2 (two) times daily.    Dispense:  180 tablet    Refill:  1  . gemfibrozil (LOPID) 600 MG tablet    Sig: Take 1 tablet (600 mg total)  by mouth 2 (two) times daily before a meal.    Dispense:  180 tablet    Refill:  1  . sertraline (ZOLOFT) 50 MG tablet    Sig: TAKE 1/2 TABLET(25 MG) BY MOUTH DAILY    Dispense:  45 tablet    Refill:  1  . simvastatin (ZOCOR) 40 MG tablet    Sig: Take 1 tablet (40 mg total) by mouth daily.    Dispense:  90 tablet    Refill:  1    Needs med refill appt  . amoxicillin-clavulanate (AUGMENTIN) 875-125 MG tablet    Sig: Take 1 tablet by mouth 2 (two) times daily.    Dispense:  20 tablet    Refill:  0  . mupirocin ointment (BACTROBAN) 2 %    Sig: Apply 1 application topically 2 (two) times daily.    Dispense:  22 g    Refill:  0      Dr. Mac[facility]  07/06/18

## 2018-07-07 LAB — HEPATIC FUNCTION PANEL
ALBUMIN: 4.5 g/dL (ref 3.6–4.8)
ALK PHOS: 68 IU/L (ref 39–117)
ALT: 13 IU/L (ref 0–44)
AST: 19 IU/L (ref 0–40)
BILIRUBIN, DIRECT: 0.13 mg/dL (ref 0.00–0.40)
Bilirubin Total: 0.4 mg/dL (ref 0.0–1.2)
Total Protein: 7.4 g/dL (ref 6.0–8.5)

## 2018-07-07 LAB — CBC WITH DIFFERENTIAL/PLATELET
BASOS: 1 %
Basophils Absolute: 0 10*3/uL (ref 0.0–0.2)
EOS (ABSOLUTE): 0.2 10*3/uL (ref 0.0–0.4)
EOS: 6 %
HEMATOCRIT: 36.6 % — AB (ref 37.5–51.0)
Hemoglobin: 12.4 g/dL — ABNORMAL LOW (ref 13.0–17.7)
Immature Grans (Abs): 0 10*3/uL (ref 0.0–0.1)
Immature Granulocytes: 0 %
LYMPHS ABS: 1.3 10*3/uL (ref 0.7–3.1)
Lymphs: 30 %
MCH: 31.7 pg (ref 26.6–33.0)
MCHC: 33.9 g/dL (ref 31.5–35.7)
MCV: 94 fL (ref 79–97)
MONOS ABS: 0.5 10*3/uL (ref 0.1–0.9)
Monocytes: 12 %
NEUTROS ABS: 2.3 10*3/uL (ref 1.4–7.0)
Neutrophils: 51 %
Platelets: 188 10*3/uL (ref 150–450)
RBC: 3.91 x10E6/uL — ABNORMAL LOW (ref 4.14–5.80)
RDW: 14.4 % (ref 12.3–15.4)
WBC: 4.3 10*3/uL (ref 3.4–10.8)

## 2018-07-07 LAB — LIPID PANEL WITH LDL/HDL RATIO
Cholesterol, Total: 155 mg/dL (ref 100–199)
HDL: 32 mg/dL — AB (ref >39)
LDL Calculated: 84 mg/dL (ref 0–99)
LDl/HDL Ratio: 2.6 ratio (ref 0.0–3.6)
Triglycerides: 194 mg/dL — ABNORMAL HIGH (ref 0–149)
VLDL Cholesterol Cal: 39 mg/dL (ref 5–40)

## 2018-07-11 ENCOUNTER — Ambulatory Visit (INDEPENDENT_AMBULATORY_CARE_PROVIDER_SITE_OTHER): Payer: Medicare Other

## 2018-07-11 DIAGNOSIS — E538 Deficiency of other specified B group vitamins: Secondary | ICD-10-CM | POA: Diagnosis not present

## 2018-07-11 MED ORDER — CYANOCOBALAMIN 1000 MCG/ML IJ SOLN
1000.0000 ug | Freq: Once | INTRAMUSCULAR | Status: AC
  Administered 2018-07-11: 1000 ug via INTRAMUSCULAR

## 2018-07-25 ENCOUNTER — Ambulatory Visit (INDEPENDENT_AMBULATORY_CARE_PROVIDER_SITE_OTHER): Payer: Medicare Other

## 2018-07-25 DIAGNOSIS — E538 Deficiency of other specified B group vitamins: Secondary | ICD-10-CM | POA: Diagnosis not present

## 2018-07-25 MED ORDER — CYANOCOBALAMIN 1000 MCG/ML IJ SOLN
1000.0000 ug | Freq: Once | INTRAMUSCULAR | Status: AC
  Administered 2018-07-25: 1000 ug via INTRAMUSCULAR

## 2018-08-05 ENCOUNTER — Other Ambulatory Visit: Payer: Self-pay | Admitting: Family Medicine

## 2018-08-06 ENCOUNTER — Other Ambulatory Visit: Payer: Self-pay | Admitting: Neurology

## 2018-08-08 ENCOUNTER — Ambulatory Visit (INDEPENDENT_AMBULATORY_CARE_PROVIDER_SITE_OTHER): Payer: Medicare Other

## 2018-08-08 DIAGNOSIS — E538 Deficiency of other specified B group vitamins: Secondary | ICD-10-CM | POA: Diagnosis not present

## 2018-08-08 MED ORDER — CYANOCOBALAMIN 1000 MCG/ML IJ SOLN
1000.0000 ug | Freq: Once | INTRAMUSCULAR | Status: AC
  Administered 2018-08-08: 1000 ug via INTRAMUSCULAR

## 2018-08-17 ENCOUNTER — Other Ambulatory Visit: Payer: Self-pay | Admitting: Family Medicine

## 2018-08-17 DIAGNOSIS — K219 Gastro-esophageal reflux disease without esophagitis: Secondary | ICD-10-CM

## 2018-08-21 ENCOUNTER — Ambulatory Visit (INDEPENDENT_AMBULATORY_CARE_PROVIDER_SITE_OTHER): Payer: Medicare Other

## 2018-08-21 DIAGNOSIS — E538 Deficiency of other specified B group vitamins: Secondary | ICD-10-CM | POA: Diagnosis not present

## 2018-08-21 MED ORDER — CYANOCOBALAMIN 1000 MCG/ML IJ SOLN
1000.0000 ug | Freq: Once | INTRAMUSCULAR | Status: AC
  Administered 2018-08-21: 1000 ug via INTRAMUSCULAR

## 2018-09-05 ENCOUNTER — Ambulatory Visit (INDEPENDENT_AMBULATORY_CARE_PROVIDER_SITE_OTHER): Payer: Medicare Other

## 2018-09-05 DIAGNOSIS — E538 Deficiency of other specified B group vitamins: Secondary | ICD-10-CM | POA: Diagnosis not present

## 2018-09-05 MED ORDER — CYANOCOBALAMIN 1000 MCG/ML IJ SOLN
1000.0000 ug | Freq: Once | INTRAMUSCULAR | Status: AC
  Administered 2018-09-05: 1000 ug via INTRAMUSCULAR

## 2018-09-13 ENCOUNTER — Other Ambulatory Visit: Payer: Self-pay | Admitting: *Deleted

## 2018-09-13 MED ORDER — INTERFERON BETA-1B 0.3 MG ~~LOC~~ KIT: PACK | SUBCUTANEOUS | 11 refills | Status: DC

## 2018-09-16 ENCOUNTER — Other Ambulatory Visit: Payer: Self-pay | Admitting: Family Medicine

## 2018-09-16 DIAGNOSIS — K219 Gastro-esophageal reflux disease without esophagitis: Secondary | ICD-10-CM

## 2018-09-19 ENCOUNTER — Ambulatory Visit (INDEPENDENT_AMBULATORY_CARE_PROVIDER_SITE_OTHER): Payer: Medicare Other

## 2018-09-19 DIAGNOSIS — E538 Deficiency of other specified B group vitamins: Secondary | ICD-10-CM

## 2018-09-19 MED ORDER — CYANOCOBALAMIN 1000 MCG/ML IJ SOLN
1000.0000 ug | Freq: Once | INTRAMUSCULAR | Status: AC
  Administered 2018-09-19: 1000 ug via INTRAMUSCULAR

## 2018-09-19 NOTE — Progress Notes (Signed)
Gave

## 2018-09-29 DIAGNOSIS — E119 Type 2 diabetes mellitus without complications: Secondary | ICD-10-CM | POA: Diagnosis not present

## 2018-09-29 DIAGNOSIS — H40003 Preglaucoma, unspecified, bilateral: Secondary | ICD-10-CM | POA: Diagnosis not present

## 2018-10-02 DIAGNOSIS — E1142 Type 2 diabetes mellitus with diabetic polyneuropathy: Secondary | ICD-10-CM | POA: Diagnosis not present

## 2018-10-02 DIAGNOSIS — Z794 Long term (current) use of insulin: Secondary | ICD-10-CM | POA: Diagnosis not present

## 2018-10-02 DIAGNOSIS — E785 Hyperlipidemia, unspecified: Secondary | ICD-10-CM | POA: Diagnosis not present

## 2018-10-02 DIAGNOSIS — E1169 Type 2 diabetes mellitus with other specified complication: Secondary | ICD-10-CM | POA: Diagnosis not present

## 2018-10-02 NOTE — Progress Notes (Signed)
[facility]  PATIENT: Jeremy [name] DOB: 1951/03/05   REASON FOR VISIT: Follow-up for multiple sclerosis gait abnormality, HISTORY FROM: Patient and wife Jeremy [name]    HISTORY OF PRESENT ILLNESS:HISTORY:He has PMHx of diabetes, hypertension and multiple sclerosis. The diagnosis of multiple sclerosis was confirmed by abnormal MRI brain and cervical, and a spinal fluid testing. He was initially followed by Dr. Estella Husk in 2008. Last seen in this office Oct 2014, He continues to have gait difficulties and relies on a cane for ambulation. He has had no problems with his vision or bowel/bladder control.  He has had occasional falls.He has ongoing pain in his legs. Last MS flare was over 2 years ago. He is currently on Betaseron every other day, treatment started in 2007.   MRI cervical in 2013 with findings consistent with MS plaques and no enhancement,mild spinal stenosis.  MRI of the brain with prominent white matter changes consistent with MS. No active areas of demyelination. He continues to have problems with low   The initial symptom was acute onset of left leg more than arm weakness, gait difficulty in 2006. This eventually led to the diagnosis of relapsing remitting multiple sclerosis. He has been receiving Betaseron treatment since early 2007. He has tolerated the medication well. He has had no problems with lipoatrophy or skin necrosis. In September 2009, he developed GI bleeding. Workup had demonstrated colon inflammation and obstruction.He underwent colectomy and recovered well from that procedure.  He continues to have gait difficulties and relies on a cane for ambulation. He has had no problems with his vision or bowel/bladder control. He does have chronic extremity pain, which has been managed with Lyrica and Tramadol.   He uses an Transport planner for situations in which he would need to walk long distances. He has had occasional falls.He has ongoing  pain in his legs  UPDATE 07/2014: He was admitted to hospital in Nov 2014 for anemia, low blood count, was evaluated by hematologist in [facility], now has recovered, he still has moderate gait difficulty.also complains bilateral lower extremity spasticity, is taking Lyrica, there was no clinical flareups, last MRI was in 2013, there was significant lesions in brain, and cervical spine  UPDATE 09/03/2014: He has tried Baclofen 17m qhs, complains of excessive sleepiness, could not tolerate it, he still mows the yard, the most limitation is his gait difficulty,he denies bowel and bladder incontinence.  We have reviewed MRI togather, MRI scan of brain showing periventricular and subcortical white matter hyperintensities consistent with multiple sclerosis. Presence of atrophy of corpus callosum indicates chronic disease. No significant change compared with MRI 06/02/2012   MRI cervical spine showing mild spondylitic changes descibed above most prominent at C 3-4 where there is moderate left formaminal narrowing. Ill defined spinal cord hypertintensities at C 3-4 to c 6-7 likely represent chronic multiple sclerosis inactive plaques. No significant change compared with MRI C spine 06/02/2012  UPDATE 11//17/16 Jeremy [name] 67year old male returns for follow-up. He was last seen in this office 03/06/15.  He was diagnosed with multiple sclerosis in 2006. He is currently on Betaseron every other day without injection site issues.. He still remains fairly active mowing the yard, denies any bowel or bladder incontinence. He denies any sensory changes, double vision speech or swallowing difficulty. He ambulates with a cane. He has had a couple of falls since last seen. He returns for reevaluation  UPDATE May 17th 2017:YY He is with his wife at today's clinical visit, he continued to receive  Betaseron every other day without significant side effect, he has significant gait difficulty, left side is weaker, complains of  chronic fatigue, chronic low back pain, bilateral lower extremity neuropathic pain, taking Lyrica, tramadol as needed, Reviewed laboratory evaluation, A1c 7.9, CMP showed elevated creatinine 1.4, normal CBC We again personally reviewed MRI of the scan without contrast, most recent was in 2015, MRI of the brain showed multiple supratentorium lesions, no contrast enhancement, MRI of cervical spine showed ill-defined C3-C7 cord lesion, no contrast enhancement, no significant change compared to previous scans UPDATE 11/20/2017CM Jeremy [name], 65-year-old male returns for follow-up with his wife is a history of multiple sclerosis and has been off of his Betaseron for approximately a week. He is trying to get patient assistance. Most recent MRI of the brain showed multiple supratentorium lesions no contrast enhancement. MRI of cervical spine showing ill-defined C3-C7 cord lesion that contrast enhancement, no significant changes when compared to previous scans. He continues to complain with some fatigue. He ambulates with a cane he denies any recent falls. He remains on Lyrica and tramadol with benefit. He returns for reevaluation UPDATE 03/15/17 CM Jeremy [name], 65-year-old male returns for follow-up with diagnosis of multiple sclerosis gait abnormality. He continues to fall mostly in his backyard. He ambulates with single-point cane. He has not had any injury He is on Betaseron every other day and denies injection site problems. His wife gives him his injection. He continues to complain with fatigue. He has vitamin B12 deficiency shots every 2 weeks he remains on Lyrica and tramadol for his pain. Most recent hemoglobin A1c 5.5 MRI of the brain and cervical spine in 2015 without significant change when compared to previous lumbar spine MRI in March 2017 with degenerative disc disease at L5-S1 and to a lesser degree at L4-L5. Update September 22, 2017:YY Creatinine 1.27 in July 2018, he is overall doing very well, mild  gait abnormality, use cane, still active at home, providing normal, no bowel bladder incontinence, diabetes, diabetic peripheral neuropathy, using Betaseron, last MRI was in 2015, UPDATE 5/30/2019CM Jeremy [name], 66-year-old male returns for follow-up with history of multiple sclerosis, diabetic peripheral neuropathy and gait disorder.  He also has a history of degenerative disc disease L5-S1.  He is currently on Betaseron tolerating it without side effects.  Last MRI of the brain 10/06/2017 results Abnormal MRI scan of brain without contrast showing multiple subcortical and periventricular nonenhancing lesions consistent with chronic demyelinating disease. No enhancing lesions are noted. Overall no significant change c/w MRI 08/15/2014.  He had 1 or 2 falls a month no significant injury.  He uses a cane at all times.  He still remains active at home.  He returns for reevaluation UPDATE 12/10/2019CM Jeremy [name], 67-year-old male returns for follow-up with history of diabetic peripheral neuropathy gait disorder and multiple sclerosis.  He also has a history of degenerative disc disease L5-S1.  He is currently on Betaseron tolerating without side effects for his multiple sclerosis.  He gets medication through patient assistance.Last MRI of the brain 10/06/2017 results Abnormal MRI scan of brain without contrast showing multiple subcortical and periventricular nonenhancing lesions consistent with chronic demyelinating disease. No enhancing lesions are noted. Overall no significant change c/w MRI 08/15/2014. He also has a B12 deficiency and gets injections.  He has 1-2 falls a month without injury.  He continues to use a single-point cane he tries to remain active at home.  No new interval medical issues.  He returns for reevaluation REVIEW OF SYSTEMS:   Full 14 system review of systems performed and notable only for those listed, all others are neg:  Constitutional: Fatigue  Cardiovascular: Swelling in the  legs Ear/Nose/Throat: neg  Skin: neg Eyes: neg Respiratory: neg Gastroitestinal: neg  Hematology/Lymphatic: Anemia, easy bruising Endocrine: Intolerance to heat Musculoskeletal: Joint pain back pain walking difficulty Allergy/Immunology: neg Neurological: Weakness Psychiatric: neg Sleep : neg   ALLERGIES: Allergies  Allergen Reactions  . Betadine [Povidone Iodine]     HOME MEDICATIONS: Outpatient Medications Prior to Visit  Medication Sig Dispense Refill  . aspirin 81 MG tablet Take 81 mg by mouth daily.    . cyanocobalamin (,VITAMIN B-12,) 1000 MCG/ML injection Inject into the muscle.    . docusate sodium (COLACE) 50 MG capsule Take 50 mg by mouth daily. otc     . ferrous sulfate (FEROSUL) 325 (65 FE) MG tablet Take 1 tablet (325 mg total) by mouth 3 (three) times daily. 270 tablet 1  . furosemide (LASIX) 40 MG tablet Take 1 tablet (40 mg total) by mouth 2 (two) times daily. 180 tablet 1  . gemfibrozil (LOPID) 600 MG tablet Take 1 tablet (600 mg total) by mouth 2 (two) times daily before a meal. 180 tablet 1  . Insulin Glargine (LANTUS) 100 UNIT/ML Solostar Pen Inject 36 Units into the skin daily. Dr Honor Junes    . Interferon Beta-1b (BETASERON) 0.3 MG KIT injection Inject subcutaneously 1 syringe every other day. 15 vial 11  . loratadine (CLARITIN) 10 MG tablet Take 10 mg by mouth daily.    . metFORMIN (GLUCOPHAGE) 500 MG tablet Take 500 mg by mouth daily with breakfast. Dr Honor Junes    . mupirocin ointment (BACTROBAN) 2 % Apply 1 application topically 2 (two) times daily. PRN    . mupirocin ointment (BACTROBAN) 2 % Apply 1 application topically 2 (two) times daily. 22 g 0  . omeprazole (PRILOSEC) 40 MG capsule TAKE 1 CAPSULE(40 MG) BY MOUTH DAILY 90 capsule 0  . pregabalin (LYRICA) 200 MG capsule Take 1 capsule (200 mg total) by mouth 2 (two) times daily. 60 capsule 6  . sertraline (ZOLOFT) 50 MG tablet TAKE 1/2 TABLET(25 MG) BY MOUTH DAILY 45 tablet 1  . simvastatin (ZOCOR)  40 MG tablet Take 1 tablet (40 mg total) by mouth daily. 90 tablet 1  . simvastatin (ZOCOR) 40 MG tablet TAKE 1 TABLET BY MOUTH DAILY 90 tablet 0  . traMADol (ULTRAM) 50 MG tablet TAKE 1 TABLET BY MOUTH EVERY 6 HOURS AS NEEDED FOR PAIN 120 tablet 0  . amoxicillin-clavulanate (AUGMENTIN) 875-125 MG tablet Take 1 tablet by mouth 2 (two) times daily. 20 tablet 0   Facility-Administered Medications Prior to Visit  Medication Dose Route Frequency Provider Last Rate Last Dose  . cyanocobalamin ((VITAMIN B-12)) injection 1,000 mcg  1,000 mcg Intramuscular Once Juline Patch, MD        PAST MEDICAL HISTORY: Past Medical History:  Diagnosis Date  . Chronic pain   . Depression   . Diabetes ([place])   . GERD (gastroesophageal reflux disease)   . Hyperlipemia   . Hypertension   . MS (multiple sclerosis) ([place])     PAST SURGICAL HISTORY: Past Surgical History:  Procedure Laterality Date  . COLECTOMY  06-2008  . COLONOSCOPY  2015   normal    FAMILY HISTORY: Family History  Problem Relation Age of Onset  . Cancer Mother   . Heart attack Father   . Heart attack Brother   . COPD Brother  SOCIAL HISTORY: Social History   Socioeconomic History  . Marital status: Married    Spouse name: [name]  . Number of children: 2  . Years of education: GED  . Highest education level: Not on file  Occupational History    Comment: Disabled  Social Needs  . Financial resource strain: Not hard at all  . Food insecurity:    Worry: Never true    Inability: Never true  . Transportation needs:    Medical: No    Non-medical: No  Tobacco Use  . Smoking status: Former Smoker    Packs/day: 1.50    Years: 30.00    Pack years: 45.00    Types: Cigarettes    Last attempt to quit: 01/23/2006    Years since quitting: 12.7  . Smokeless tobacco: Never Used  . Tobacco comment: N/A  Substance and Sexual Activity  . Alcohol use: Yes    Alcohol/week: 0.0 standard drinks    Comment: rare; maybe 2 beers a  year  . Drug use: No  . Sexual activity: Not Currently  Lifestyle  . Physical activity:    Days per week: 0 days    Minutes per session: 0 min  . Stress: Not at all  Relationships  . Social connections:    Talks on phone: More than three times a week    Gets together: Once a week    Attends religious service: More than 4 times per year    Active member of club or organization: No    Attends meetings of clubs or organizations: Never    Relationship status: Married  . Intimate partner violence:    Fear of current or ex partner: No    Emotionally abused: No    Physically abused: No    Forced sexual activity: No  Other Topics Concern  . Not on file  Social History Narrative   Patient is disabled.    Patient lives with his wife [name] Ciesla.    Patient has 2 children.         PHYSICAL EXAM  Vitals:   10/03/18 1344  BP: (!) 156/71  [name]: 87  Weight: 235 lb (106.6 kg)  Height: 5' 10" (1.778 m)   Body mass index is 33.72 kg/m.  Generalized: Well developed, obese male in no acute distress  Head: normocephalic and atraumatic,. Oropharynx benign  Neck: Supple,  Musculoskeletal: Mild left hemiparesis  Neurological examination   Mentation: Alert oriented to time, place, history taking. Attention span and concentration appropriate. Recent and remote memory intact.  Follows all commands speech and language fluent.   Cranial nerve II-XII: Fundoscopic exam reveals sharp disc margins.Pupils were equal round reactive to light extraocular movements were full, visual field were full on confrontational test. Facial sensation and strength were normal. hearing was intact to finger rubbing bilaterally. Uvula tongue midline. head turning and shoulder shrug were normal and symmetric.Tongue protrusion into cheek strength was normal. Motor: Mild left-sided weakness Sensory: Length dependent decreased  light touch, pinprick, and  Vibration,  to mid shin  Coordination: finger-nose-finger,  heel-to-shin bilaterally, no dysmetria Reflexes: Hyperreflexia of both lower extremities, plantar responses were flexor bilaterally. Gait and Station:Need to push up to get up from seated position wide based, stiff, unsteady left hemi-circumferential gait . Ambulates with single-point cane DIAGNOSTIC DATA (LABS, IMAGING, TESTING) - I reviewed patient records, labs, notes, testing and imaging myself where available.  Lab Results  Component Value Date   WBC 4.3 07/06/2018   HGB 12.4 (L)   07/06/2018   HCT 36.6 (L) 07/06/2018   MCV 94 07/06/2018   PLT 188 07/06/2018      Component Value Date/Time   NA 142 03/23/2018 1353   NA 143 08/31/2013 0417   K 4.4 03/23/2018 1353   K 3.5 08/31/2013 0417   CL 102 03/23/2018 1353   CL 109 (H) 08/31/2013 0417   CO2 23 03/23/2018 1353   CO2 28 08/31/2013 0417   GLUCOSE 146 (H) 03/23/2018 1353   GLUCOSE 133 (H) 08/31/2013 0417   BUN 22 03/23/2018 1353   BUN 39 (H) 08/31/2013 0417   CREATININE 1.29 (H) 03/23/2018 1353   CREATININE 1.53 (H) 08/31/2013 0417   CALCIUM 9.9 03/23/2018 1353   CALCIUM 8.9 08/31/2013 0417   PROT 7.4 07/06/2018 1102   PROT 6.2 (L) 08/31/2013 0417   ALBUMIN 4.5 07/06/2018 1102   ALBUMIN 2.9 (L) 08/31/2013 0417   AST 19 07/06/2018 1102   AST 35 08/31/2013 0417   ALT 13 07/06/2018 1102   ALT 15 08/31/2013 0417   ALKPHOS 68 07/06/2018 1102   ALKPHOS 60 08/31/2013 0417   BILITOT 0.4 07/06/2018 1102   BILITOT 0.6 08/31/2013 0417   GFRNONAA 58 (L) 03/23/2018 1353   GFRNONAA 48 (L) 08/31/2013 0417   GFRAA 67 03/23/2018 1353   GFRAA 56 (L) 08/31/2013 0417   Lab Results  Component Value Date   CHOL 155 07/06/2018   HDL 32 (L) 07/06/2018   LDLCALC 84 07/06/2018   TRIG 194 (H) 07/06/2018   CHOLHDL 6.1 (H) 08/06/2016   Lab Results  Component Value Date   HGBA1C 5.9 08/12/2017    ASSESSMENT AND PLAN  67 y.o. year old male  with relapsing remitting multiple sclerosis, progressive worsening gait abnormality  multifactorial due to multiple sclerosis diabetic peripheral neuropathy deconditioning.Last MRI of the brain 10/06/2017 results Abnormal MRI scan of brain without contrast showing multiple subcortical and periventricular nonenhancing lesions consistent with chronic demyelinating disease. No enhancing lesions are noted. Overall no significant change c/w MRI 08/15/2014.    PLAN: Continue Betaseron injection every other day, through pt assistance Reviewed recent  labs from 07/06/18, CBC and hepatic panel Use cane at all times, patient has significant gait abnormality along with chronic low back pain and neuropathy Refill Ultram for pain prn F/U in 6 months  Nancy Carolyn Martin, GNP, BC, APRN  [facility] [address], [address] [place],  [address] [phone] 

## 2018-10-03 ENCOUNTER — Encounter: Payer: Self-pay | Admitting: Nurse Practitioner

## 2018-10-03 ENCOUNTER — Ambulatory Visit (INDEPENDENT_AMBULATORY_CARE_PROVIDER_SITE_OTHER): Payer: Medicare Other

## 2018-10-03 ENCOUNTER — Ambulatory Visit (INDEPENDENT_AMBULATORY_CARE_PROVIDER_SITE_OTHER): Payer: Medicare Other | Admitting: Nurse Practitioner

## 2018-10-03 VITALS — BP 156/71 | HR 87 | Ht 70.0 in | Wt 235.0 lb

## 2018-10-03 DIAGNOSIS — G35 Multiple sclerosis: Secondary | ICD-10-CM

## 2018-10-03 DIAGNOSIS — G63 Polyneuropathy in diseases classified elsewhere: Secondary | ICD-10-CM | POA: Diagnosis not present

## 2018-10-03 DIAGNOSIS — E538 Deficiency of other specified B group vitamins: Secondary | ICD-10-CM | POA: Diagnosis not present

## 2018-10-03 DIAGNOSIS — R269 Unspecified abnormalities of gait and mobility: Secondary | ICD-10-CM

## 2018-10-03 DIAGNOSIS — E349 Endocrine disorder, unspecified: Secondary | ICD-10-CM

## 2018-10-03 MED ORDER — CYANOCOBALAMIN 1000 MCG/ML IJ SOLN
1000.0000 ug | Freq: Once | INTRAMUSCULAR | Status: AC
  Administered 2018-10-03: 1000 ug via INTRAMUSCULAR

## 2018-10-03 MED ORDER — TRAMADOL HCL 50 MG PO TABS: 50.0000 mg | ORAL_TABLET | Freq: Four times a day (QID) | ORAL | 0 refills | Status: DC | PRN

## 2018-10-03 NOTE — Patient Instructions (Signed)
Continue Betaseron injection every other day, through pt assistance Reviewed recent  labs from 07/06/18 Use cane at all times, patient has significant gait abnormality along with chronic low back pain and neuropathy Refill Ultram for pain prn F/U in 6 months

## 2018-10-03 NOTE — Progress Notes (Signed)
Fax confirmaiton received for tramadol [facility] in [place].  [phone]. sy

## 2018-10-04 ENCOUNTER — Ambulatory Visit (INDEPENDENT_AMBULATORY_CARE_PROVIDER_SITE_OTHER): Payer: Medicare Other

## 2018-10-04 VITALS — BP 124/78 | HR 68 | Temp 97.6°F | Resp 16 | Ht 70.0 in | Wt 236.6 lb

## 2018-10-04 DIAGNOSIS — Z0000 Encounter for general adult medical examination without abnormal findings: Secondary | ICD-10-CM | POA: Diagnosis not present

## 2018-10-04 NOTE — Progress Notes (Signed)
Subjective:   Jeremy [name] is a 67 y.o. male who presents for Medicare Annual/Subsequent preventive examination.  Review of Systems:   Cardiac Risk Factors include: advanced age (>41mn, >>18women);diabetes mellitus;dyslipidemia;hypertension;male gender;obesity (BMI >30kg/m2)     Objective:    Vitals: BP 124/78 (BP Location: Left Arm, Patient Position: Sitting, Cuff Size: Large)   Pulse 68   Temp 97.6 F (36.4 C) (Oral)   Resp 16   Ht 5' 10"  (1.778 m)   Wt 236 lb 9.6 oz (107.3 kg)   BMI 33.95 kg/m   Body mass index is 33.95 kg/m.  Advanced Directives 10/04/2018 09/29/2017 09/11/2015  Does Patient Have a Medical Advance Directive? No No Yes  Type of Advance Directive - - Living will  Copy of [place] Chart? - - No - copy requested  Would patient like information on creating a medical advance directive? Yes (MAU/Ambulatory/Procedural Areas - Information given) Yes (MAU/Ambulatory/Procedural Areas - Information given) -    Tobacco Social History   Tobacco Use  Smoking Status Former Smoker  . Packs/day: 1.50  . Years: 30.00  . Pack years: 45.00  . Types: Cigarettes  . Last attempt to quit: 01/23/2006  . Years since quitting: 12.7  Smokeless Tobacco Never Used  Tobacco Comment   N/A     Counseling given: Not Answered Comment: N/A   Clinical Intake:  Pre-visit preparation completed: Yes  Pain : No/denies pain     BMI - recorded: 34.01 Nutritional Status: BMI > 30  Obese Diabetes: Yes CBG done?: No Did pt. bring in CBG monitor from home?: No   Nutrition Risk Assessment:  Has the patient had any N/V/D within the last 2 months?  No  Does the patient have any non-healing wounds?  No  Has the patient had any unintentional weight loss or weight gain?  No   Diabetes:  Is the patient diabetic?  Yes  If diabetic, was a CBG obtained today?  No  Did the patient bring in their glucometer from home?  No  How often do you monitor your  CBG's? Twice daily once in am and once in pm.   Financial Strains and Diabetes Management:  Are you having any financial strains with the device, your supplies or your medication? No .  Does the patient want to be seen by Chronic Care Management for management of their diabetes?  No  Would the patient like to be referred to a Nutritionist or for Diabetic Management?  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 [place].   Diabetic Foot Exam: Completed 10/02/18. Pt does wear diabetic shoes to help with neuropathy.  How often do you need to have someone help you when you read instructions, pamphlets, or other written materials from your doctor or pharmacy?: 1 - Never What is the last grade level you completed in school?: 12th grade  Interpreter Needed?: No  Information entered by :: [name]  Past Medical History:  Diagnosis Date  . Chronic pain   . Depression   . Diabetes ([place]   . GERD (gastroesophageal reflux disease)   . Hyperlipemia   . Hypertension   . MS (multiple sclerosis) ([place]    Past Surgical History:  Procedure Laterality Date  . COLECTOMY  06-2008  . COLONOSCOPY  2015   normal   Family History  Problem Relation Age of Onset  .  Lung cancer Mother   . Heart attack Father   . Heart attack Brother   . COPD Brother    Social History   Socioeconomic History  . Marital status: Married    Spouse name: [name]  . Number of children: 2  . Years of education: GED  . Highest education level: Not on file  Occupational History    Comment: Disabled  Social Needs  . Financial resource strain: Not hard at all  . Food insecurity:    Worry: Never true    Inability: Never true  . Transportation needs:    Medical: No    Non-medical: No  Tobacco Use  . Smoking status: Former Smoker    Packs/day: 1.50    Years: 30.00    Pack years: 45.00    Types: Cigarettes     Last attempt to quit: 01/23/2006    Years since quitting: 12.7  . Smokeless tobacco: Never Used  . Tobacco comment: N/A  Substance and Sexual Activity  . Alcohol use: Yes    Alcohol/week: 0.0 standard drinks    Comment: rare; maybe 2 beers a year  . Drug use: No  . Sexual activity: Not Currently  Lifestyle  . Physical activity:    Days per week: 0 days    Minutes per session: 0 min  . Stress: Not at all  Relationships  . Social connections:    Talks on phone: More than three times a week    Gets together: Once a week    Attends religious service: More than 4 times per year    Active member of club or organization: No    Attends meetings of clubs or organizations: Never    Relationship status: Married  Other Topics Concern  . Not on file  Social History Narrative   Patient is disabled.    Patient lives with his wife Brycin Kille.    Patient has 2 children.        Outpatient Encounter Medications as of 10/04/2018  Medication Sig  . aspirin 81 MG tablet Take 81 mg by mouth daily.  . cyanocobalamin (,VITAMIN B-12,) 1000 MCG/ML injection Inject into the muscle.  . docusate sodium (COLACE) 50 MG capsule Take 50 mg by mouth daily. otc   . ferrous sulfate (FEROSUL) 325 (65 FE) MG tablet Take 1 tablet (325 mg total) by mouth 3 (three) times daily.  . furosemide (LASIX) 40 MG tablet Take 1 tablet (40 mg total) by mouth 2 (two) times daily.  Marland Kitchen gemfibrozil (LOPID) 600 MG tablet Take 1 tablet (600 mg total) by mouth 2 (two) times daily before a meal.  . Insulin Glargine (LANTUS) 100 UNIT/ML Solostar Pen Inject 36 Units into the skin daily. Dr Honor Junes  . Interferon Beta-1b (BETASERON) 0.3 MG KIT injection Inject subcutaneously 1 syringe every other day.  . loratadine (CLARITIN) 10 MG tablet Take 10 mg by mouth daily.  . metFORMIN (GLUCOPHAGE) 500 MG tablet Take 500 mg by mouth daily with breakfast. Dr Honor Junes  . mupirocin ointment (BACTROBAN) 2 % Apply 1 application topically 2 (two)  times daily. PRN  . mupirocin ointment (BACTROBAN) 2 % Apply 1 application topically 2 (two) times daily.  Marland Kitchen omeprazole (PRILOSEC) 40 MG capsule TAKE 1 CAPSULE(40 MG) BY MOUTH DAILY  . pregabalin (LYRICA) 200 MG capsule Take 1 capsule (200 mg total) by mouth 2 (two) times daily.  . sertraline (ZOLOFT) 50 MG tablet TAKE 1/2 TABLET(25 MG) BY MOUTH DAILY  . simvastatin (ZOCOR) 40 MG  tablet Take 1 tablet (40 mg total) by mouth daily.  . simvastatin (ZOCOR) 40 MG tablet TAKE 1 TABLET BY MOUTH DAILY  . traMADol (ULTRAM) 50 MG tablet Take 1 tablet (50 mg total) by mouth every 6 (six) hours as needed. for pain   Facility-Administered Encounter Medications as of 10/04/2018  Medication  . cyanocobalamin ((VITAMIN B-12)) injection 1,000 mcg    Activities of Daily Living In your present state of health, do you have any difficulty performing the following activities: 10/04/2018  Hearing? N  Comment pt declines hearing aids  Vision? N  Comment wears eyeglasses  Difficulty concentrating or making decisions? N  Walking or climbing stairs? N  Dressing or bathing? N  Doing errands, shopping? N  Preparing Food and eating ? N  Using the Toilet? N  In the past six months, have you accidently leaked urine? N  Do you have problems with loss of bowel control? N  Managing your Medications? N  Managing your Finances? N  Housekeeping or managing your Housekeeping? N  Some recent data might be hidden    Patient Care Team: Juline Patch, MD as PCP - General (Family Medicine) Marcial Pacas, MD as Consulting Physician (Neurology) Samara Deist, DPM as Consulting Physician (Podiatry) Lonia Farber, MD as Consulting Physician (Internal Medicine) Magnus Sinning, MD as Consulting Physician (Nephrology)   Assessment:   This is a routine wellness examination for Kiyan.  Exercise Activities and Dietary recommendations Current Exercise Habits: Home exercise routine, Type of exercise: Other - see  comments(recumbent bike), Time (Minutes): 30, Frequency (Times/Week): 3, Weekly Exercise (Minutes/Week): 90, Intensity: Mild, Exercise limited by: neurologic condition(s);orthopedic condition(s)  Goals    . Prevent falls     Recommend to maintain adequate lighting in walkways, remove rugs that may cause slips or trips and continue to use cane for assistance with ambulation.       Fall Risk Fall Risk  10/04/2018 07/06/2018 03/23/2018 09/29/2017 03/15/2017  Falls in the past year? 1 Yes Yes Yes Yes  Number falls in past yr: 1 2 or more 2 or more 2 or more 2 or more  Injury with Fall? 0 No Yes Yes No  Comment mild skin abrasions - bruises, skin tears rib fracture -  Risk Factor Category  - High Fall Risk - High Fall Risk -  Comment - - - h/o MS -  Risk for fall due to : History of fall(s);Impaired balance/gait Impaired balance/gait;Impaired mobility Impaired balance/gait History of fall(s);Impaired balance/gait;Impaired mobility;Impaired vision -  Risk for fall due to: Comment - - - h/o MS. Walks with cane. Wears eyeglasses -  Follow up Falls prevention discussed Falls evaluation completed - Falls evaluation completed;Falls prevention discussed;Education provided -  Comment - - - TUG test performed. Walks with cane. H/o MS -   FALL RISK PREVENTION PERTAINING TO THE HOME:  Any stairs in or around the home WITH handrails? No  Home free of loose throw rugs in walkways, pet beds, electrical cords, etc? Yes  Adequate lighting in your home to reduce risk of falls? Yes   ASSISTIVE DEVICES UTILIZED TO PREVENT FALLS:  Life alert? No  Use of a cane, walker or w/c? Yes  Grab bars in the bathroom? Yes  Shower chair or bench in shower? Yes  Elevated toilet seat or a handicapped toilet? Yes   DME ORDERS:  DME order needed?  No   TIMED UP AND GO:  Was the test performed? Yes .  Length of time  to ambulate 10 feet: 7 sec.   GAIT:  Appearance of gait: Gait slow, steady and with the use of an  assistive device.  Education: Fall risk prevention has been discussed.  Intervention(s) required? No    Depression Screen PHQ 2/9 Scores 10/04/2018 07/06/2018 07/06/2018 09/29/2017  PHQ - 2 Score 0 0 0 0  PHQ- 9 Score 6 4 - -    Cognitive Function     6CIT Screen 10/04/2018 09/29/2017  What Year? 0 points 0 points  What month? 0 points 0 points  What time? 0 points 0 points  Count back from 20 0 points 0 points  Months in reverse 0 points 0 points  Repeat phrase 2 points 2 points  Total Score 2 2    Immunization History  Administered Date(s) Administered  . Influenza, High Dose Seasonal PF 08/09/2017, 07/06/2018  . Influenza,inj,Quad PF,6+ Mos 07/28/2015, 08/06/2016  . Influenza-Unspecified 07/25/2014  . Pneumococcal Conjugate-13 09/29/2017  . Tdap 07/06/2018    Qualifies for Shingles Vaccine? Yes  . Due for Shingrix. Education has been provided regarding the importance of this vaccine. Pt has been advised to call insurance company to determine out of pocket expense. Advised may also receive vaccine at local pharmacy or Health Dept. Verbalized acceptance and understanding. Pt declines.  Tdap: Up to date   Flu Vaccine: Up to date   Pneumococcal Vaccine: Up to date   Screening Tests Health Maintenance  Topic Date Due  . URINE MICROALBUMIN  08/12/2018  . PNA vac Low Risk Adult (2 of 2 - PPSV23) 09/29/2018  . COLONOSCOPY  07/07/2019 (Originally 05/04/2002)  . HEMOGLOBIN A1C  12/29/2018  . OPHTHALMOLOGY EXAM  03/26/2019  . FOOT EXAM  05/04/2019  . TETANUS/TDAP  07/06/2028  . INFLUENZA VACCINE  Completed  . Hepatitis C Screening  Completed   Cancer Screenings:  Colorectal Screening: Completed 2011. Repeat every 10 years;   Lung Cancer Screening: (Low Dose CT Chest recommended if Age 81-80 years, 30 pack-year currently smoking OR have quit w/in 15years.) does not qualify.   Additional Screening:  Hepatitis C Screening: does qualify; Completed 09/29/17  Vision  Screening: Recommended annual ophthalmology exams for early detection of glaucoma and other disorders of the eye. Is the patient up to date with their annual eye exam?  Yes  Who is the provider or what is the name of the office in which the pt attends annual eye exams? [facility] Screening: Recommended annual dental exams for proper oral hygiene  Community Resource Referral:  CRR required this visit?  No   Plan:    I have personally reviewed and addressed the Medicare Annual Wellness questionnaire and have noted the following in the patient's chart:  A. Medical and social history B. Use of alcohol, tobacco or illicit drugs  C. Current medications and supplements D. Functional ability and status E.  Nutritional status F.  Physical activity G. Advance directives H. List of other physicians I.  Hospitalizations, surgeries, and ER visits in previous 12 months J.  [place] such as hearing and vision if needed, cognitive and depression L. Referrals and appointments   In addition, I have reviewed and discussed with patient certain preventive protocols, quality metrics, and best practice recommendations. A written personalized care plan for preventive services as well as general preventive health recommendations were provided to patient.   Signed,  [name], LPN Nurse Health Advisor   Nurse Notes:Pt doing well and appreciative of visit today.

## 2018-10-04 NOTE — Progress Notes (Signed)
I have reviewed and agreed above plan. 

## 2018-10-04 NOTE — Patient Instructions (Signed)
Jeremy [name] , Thank you for taking time to come for your Medicare Wellness Visit. I appreciate your ongoing commitment to your health goals. Please review the following plan we discussed and let me know if I can assist you in the future.   Screening recommendations/referrals: Colonoscopy: due 2021 Recommended yearly ophthalmology/optometry visit for glaucoma screening and checkup Recommended yearly dental visit for hygiene and checkup  Vaccinations: Influenza vaccine: done 07/06/18 Pneumococcal vaccine: done 09/29/17 Tdap vaccine: done 07/06/18 Shingles vaccine: Shingrix discussed. Please contact your pharmacy for coverage information.     Advanced directives: Advance directive discussed with you today. I have provided a copy for you to complete at home and have notarized. Once this is complete please bring a copy in to our office so we can scan it into your chart.  Conditions/risks identified: Reduce fall risk with exercise and gait training for stability.   Next appointment: Please follow up in one year for your Medicare Annual Wellness visit.    Preventive Care 7 Years and Older, Male Preventive care refers to lifestyle choices and visits with your health care provider that can promote health and wellness. What does preventive care include?  A yearly physical exam. This is also called an annual well check.  Dental exams once or twice a year.  Routine eye exams. Ask your health care provider how often you should have your eyes checked.  Personal lifestyle choices, including:  Daily care of your teeth and gums.  Regular physical activity.  Eating a healthy diet.  Avoiding tobacco and drug use.  Limiting alcohol use.  Practicing safe sex.  Taking low doses of aspirin every day.  Taking vitamin and mineral supplements as recommended by your health care provider. What happens during an annual well check? The services and screenings done by your health care provider during  your annual well check will depend on your age, overall health, lifestyle risk factors, and family history of disease. Counseling  Your health care provider may ask you questions about your:  Alcohol use.  Tobacco use.  Drug use.  Emotional well-being.  Home and relationship well-being.  Sexual activity.  Eating habits.  History of falls.  Memory and ability to understand (cognition).  Work and work Statistician. Screening  You may have the following tests or measurements:  Height, weight, and BMI.  Blood pressure.  Lipid and cholesterol levels. These may be checked every 5 years, or more frequently if you are over 39 years old.  Skin check.  Lung cancer screening. You may have this screening every year starting at age 42 if you have a 30-pack-year history of smoking and currently smoke or have quit within the past 15 years.  Fecal occult blood test (FOBT) of the stool. You may have this test every year starting at age 45.  Flexible sigmoidoscopy or colonoscopy. You may have a sigmoidoscopy every 5 years or a colonoscopy every 10 years starting at age 29.  Prostate cancer screening. Recommendations will vary depending on your family history and other risks.  Hepatitis C blood test.  Hepatitis B blood test.  Sexually transmitted disease (STD) testing.  Diabetes screening. This is done by checking your blood sugar (glucose) after you have not eaten for a while (fasting). You may have this done every 1-3 years.  Abdominal aortic aneurysm (AAA) screening. You may need this if you are a current or former smoker.  Osteoporosis. You may be screened starting at age 55 if you are at high risk. Talk  with your health care provider about your test results, treatment options, and if necessary, the need for more tests. Vaccines  Your health care provider may recommend certain vaccines, such as:  Influenza vaccine. This is recommended every year.  Tetanus, diphtheria, and  acellular pertussis (Tdap, Td) vaccine. You may need a Td booster every 10 years.  Zoster vaccine. You may need this after age 32.  Pneumococcal 13-valent conjugate (PCV13) vaccine. One dose is recommended after age 14.  Pneumococcal polysaccharide (PPSV23) vaccine. One dose is recommended after age 43. Talk to your health care provider about which screenings and vaccines you need and how often you need them. This information is not intended to replace advice given to you by your health care provider. Make sure you discuss any questions you have with your health care provider. Document Released: 11/07/2015 Document Revised: 06/30/2016 Document Reviewed: 08/12/2015 Elsevier Interactive Patient Education  2017 [place] Prevention in the Home Falls can cause injuries. They can happen to people of all ages. There are many things you can do to make your home safe and to help prevent falls. What can I do on the outside of my home?  Regularly fix the edges of walkways and driveways and fix any cracks.  Remove anything that might make you trip as you walk through a door, such as a raised step or threshold.  Trim any bushes or trees on the path to your home.  Use bright outdoor lighting.  Clear any walking paths of anything that might make someone trip, such as rocks or tools.  Regularly check to see if handrails are loose or broken. Make sure that both sides of any steps have handrails.  Any raised decks and porches should have guardrails on the edges.  Have any leaves, snow, or ice cleared regularly.  Use sand or salt on walking paths during winter.  Clean up any spills in your garage right away. This includes oil or grease spills. What can I do in the bathroom?  Use night lights.  Install grab bars by the toilet and in the tub and shower. Do not use towel bars as grab bars.  Use non-skid mats or decals in the tub or shower.  If you need to sit down in the shower, use  a plastic, non-slip stool.  Keep the floor dry. Clean up any water that spills on the floor as soon as it happens.  Remove soap buildup in the tub or shower regularly.  Attach bath mats securely with double-sided non-slip rug tape.  Do not have throw rugs and other things on the floor that can make you trip. What can I do in the bedroom?  Use night lights.  Make sure that you have a light by your bed that is easy to reach.  Do not use any sheets or blankets that are too big for your bed. They should not hang down onto the floor.  Have a firm chair that has side arms. You can use this for support while you get dressed.  Do not have throw rugs and other things on the floor that can make you trip. What can I do in the kitchen?  Clean up any spills right away.  Avoid walking on wet floors.  Keep items that you use a lot in easy-to-reach places.  If you need to reach something above you, use a strong step stool that has a grab bar.  Keep electrical cords out of the way.  Do  not use floor polish or wax that makes floors slippery. If you must use wax, use non-skid floor wax.  Do not have throw rugs and other things on the floor that can make you trip. What can I do with my stairs?  Do not leave any items on the stairs.  Make sure that there are handrails on both sides of the stairs and use them. Fix handrails that are broken or loose. Make sure that handrails are as long as the stairways.  Check any carpeting to make sure that it is firmly attached to the stairs. Fix any carpet that is loose or worn.  Avoid having throw rugs at the top or bottom of the stairs. If you do have throw rugs, attach them to the floor with carpet tape.  Make sure that you have a light switch at the top of the stairs and the bottom of the stairs. If you do not have them, ask someone to add them for you. What else can I do to help prevent falls?  Wear shoes that:  Do not have high heels.  Have  rubber bottoms.  Are comfortable and fit you well.  Are closed at the toe. Do not wear sandals.  If you use a stepladder:  Make sure that it is fully opened. Do not climb a closed stepladder.  Make sure that both sides of the stepladder are locked into place.  Ask someone to hold it for you, if possible.  Clearly mark and make sure that you can see:  Any grab bars or handrails.  First and last steps.  Where the edge of each step is.  Use tools that help you move around (mobility aids) if they are needed. These include:  Canes.  Walkers.  Scooters.  Crutches.  Turn on the lights when you go into a dark area. Replace any light bulbs as soon as they burn out.  Set up your furniture so you have a clear path. Avoid moving your furniture around.  If any of your floors are uneven, fix them.  If there are any pets around you, be aware of where they are.  Review your medicines with your doctor. Some medicines can make you feel dizzy. This can increase your chance of falling. Ask your doctor what other things that you can do to help prevent falls. This information is not intended to replace advice given to you by your health care provider. Make sure you discuss any questions you have with your health care provider. Document Released: 08/07/2009 Document Revised: 03/18/2016 Document Reviewed: 11/15/2014 Elsevier Interactive Patient Education  2017 [employer].

## 2018-10-16 ENCOUNTER — Ambulatory Visit (INDEPENDENT_AMBULATORY_CARE_PROVIDER_SITE_OTHER): Payer: Medicare Other

## 2018-10-16 DIAGNOSIS — E538 Deficiency of other specified B group vitamins: Secondary | ICD-10-CM | POA: Diagnosis not present

## 2018-10-16 MED ORDER — CYANOCOBALAMIN 1000 MCG/ML IJ SOLN
1000.0000 ug | Freq: Once | INTRAMUSCULAR | Status: AC
  Administered 2018-10-16: 1000 ug via INTRAMUSCULAR

## 2018-10-31 ENCOUNTER — Ambulatory Visit (INDEPENDENT_AMBULATORY_CARE_PROVIDER_SITE_OTHER): Payer: Medicare Other

## 2018-10-31 DIAGNOSIS — E538 Deficiency of other specified B group vitamins: Secondary | ICD-10-CM

## 2018-10-31 MED ORDER — CYANOCOBALAMIN 1000 MCG/ML IJ SOLN
1000.0000 ug | Freq: Once | INTRAMUSCULAR | Status: AC
  Administered 2018-10-31: 1000 ug via INTRAMUSCULAR

## 2018-11-03 ENCOUNTER — Telehealth: Payer: Self-pay | Admitting: Nurse Practitioner

## 2018-11-03 DIAGNOSIS — N183 Chronic kidney disease, stage 3 (moderate): Secondary | ICD-10-CM | POA: Diagnosis not present

## 2018-11-03 DIAGNOSIS — I10 Essential (primary) hypertension: Secondary | ICD-10-CM | POA: Diagnosis not present

## 2018-11-03 DIAGNOSIS — E1129 Type 2 diabetes mellitus with other diabetic kidney complication: Secondary | ICD-10-CM | POA: Diagnosis not present

## 2018-11-03 DIAGNOSIS — R609 Edema, unspecified: Secondary | ICD-10-CM | POA: Diagnosis not present

## 2018-11-03 DIAGNOSIS — I270 Primary pulmonary hypertension: Secondary | ICD-10-CM | POA: Diagnosis not present

## 2018-11-03 MED ORDER — INTERFERON BETA-1B 0.3 MG ~~LOC~~ KIT: PACK | SUBCUTANEOUS | 11 refills | Status: DC

## 2018-11-03 NOTE — Telephone Encounter (Signed)
Spoke to wife.  She stated that husband was getting betaseron [employer] / Pt assistance and with speaking with [facility], then the other foundation they used last year, they were told to get from regular pharmacy.  I called and spoke to pharmacist and she said she would not know until med order placed.  I will send to them.

## 2018-11-03 NOTE — Telephone Encounter (Addendum)
Pt wife(on [facility]athey) is asking for a refill on Interferon Beta-1b (BETASERON) 0.3 MG KIT injection [place] ID # [id number] [phone] WHQ#[id number] PCN#[id number]

## 2018-11-03 NOTE — Telephone Encounter (Signed)
Escribed order to local pharmacy. Spoke to wife and let her know about this.  She will call later to day and see what they need to do.  If an issue she will call us Monday as close today at 1200.  She verbalized understanding.

## 2018-11-03 NOTE — Addendum Note (Signed)
Addended by: Brandon Melnick on: 11/03/2018 11:28 AM   Modules accepted: Orders

## 2018-11-06 ENCOUNTER — Encounter: Payer: Self-pay | Admitting: Family Medicine

## 2018-11-06 ENCOUNTER — Ambulatory Visit (INDEPENDENT_AMBULATORY_CARE_PROVIDER_SITE_OTHER): Payer: Medicare Other | Admitting: Family Medicine

## 2018-11-06 VITALS — BP 130/62 | HR 80 | Ht 70.0 in | Wt 236.0 lb

## 2018-11-06 DIAGNOSIS — J0140 Acute pansinusitis, unspecified: Secondary | ICD-10-CM

## 2018-11-06 MED ORDER — AMOXICILLIN-POT CLAVULANATE 875-125 MG PO TABS: 1.0000 | ORAL_TABLET | Freq: Two times a day (BID) | ORAL | 0 refills | Status: DC

## 2018-11-06 NOTE — Progress Notes (Signed)
Date:  11/06/2018   Name:  Jeremy [name]   DOB:  08/08/1951   MRN:  [id number]   Chief Complaint: Sinusitis (green production/ headache)  Sinusitis  This is a new problem. The current episode started yesterday. The problem has been gradually worsening since onset. There has been no fever. His pain is at a severity of 4/10 (frontal sinus). The pain is moderate. Associated symptoms include congestion, coughing, headaches, a hoarse voice, shortness of breath, sinus pressure, sneezing and a sore throat. Pertinent negatives include no chills, diaphoresis, ear pain, neck pain or swollen glands. Past treatments include acetaminophen. The treatment provided mild relief.    Review of Systems  Constitutional: Negative for chills, diaphoresis and fever.  HENT: Positive for congestion, hoarse voice, sinus pressure, sneezing and sore throat. Negative for drooling, ear discharge and ear pain.   Respiratory: Positive for cough and shortness of breath. Negative for wheezing.   Cardiovascular: Negative for chest pain, palpitations and leg swelling.  Gastrointestinal: Negative for abdominal pain, blood in stool, constipation, diarrhea and nausea.  Endocrine: Negative for polydipsia.  Genitourinary: Negative for dysuria, frequency, hematuria and urgency.  Musculoskeletal: Negative for back pain, myalgias and neck pain.  Skin: Negative for rash.  Allergic/Immunologic: Negative for environmental allergies.  Neurological: Positive for headaches. Negative for dizziness.  Hematological: Does not bruise/bleed easily.  Psychiatric/Behavioral: Negative for suicidal ideas. The patient is not nervous/anxious.     Patient Active Problem List   Diagnosis Date Noted  . Therapeutic drug monitoring 03/23/2018  . Mild episode of recurrent major depressive disorder ([place]) 09/26/2017  . Ventricular ectopic beats 09/26/2017  . Type 2 diabetes mellitus with diabetic polyneuropathy, with long-term current use of insulin  ([place]) 08/12/2017  . Hyperlipidemia due to type 2 diabetes mellitus ([place]) 08/12/2017  . B12 deficiency 12/14/2016  . Low back pain 03/10/2016  . Lumbosacral disc disease 01/01/2016  . Neuropathy associated with endocrine disorder ([place]) 11/19/2015  . B12 deficiency anemia 06/03/2015  . Essential hypertension 06/03/2015  . Hyperlipidemia 06/03/2015  . Depression 06/03/2015  . Gastroesophageal reflux disease without esophagitis 06/03/2015  . Edema extremities 06/03/2015  . Multiple sclerosis ([place]) 07/26/2013  . Abnormality of gait 07/26/2013  . Morbid obesity ([place]) 07/26/2013    Allergies  Allergen Reactions  . Betadine [Povidone Iodine]     Past Surgical History:  Procedure Laterality Date  . COLECTOMY  06-2008  . COLONOSCOPY  2015   normal    Social History   Tobacco Use  . Smoking status: Former Smoker    Packs/day: 1.50    Years: 30.00    Pack years: 45.00    Types: Cigarettes    Last attempt to quit: 01/23/2006    Years since quitting: 12.7  . Smokeless tobacco: Never Used  . Tobacco comment: N/A  Substance Use Topics  . Alcohol use: Yes    Alcohol/week: 0.0 standard drinks    Comment: rare; maybe 2 beers a year  . Drug use: No     Medication list has been reviewed and updated.  Current Meds  Medication Sig  . aspirin 81 MG tablet Take 81 mg by mouth daily.  . cyanocobalamin (,VITAMIN B-12,) 1000 MCG/ML injection Inject into the muscle.  . docusate sodium (COLACE) 50 MG capsule Take 50 mg by mouth daily. otc   . ferrous sulfate (FEROSUL) 325 (65 FE) MG tablet Take 1 tablet (325 mg total) by mouth 3 (three) times daily.  . furosemide (LASIX) 40 MG tablet Take 1  tablet (40 mg total) by mouth 2 (two) times daily.  Marland Kitchen gemfibrozil (LOPID) 600 MG tablet Take 1 tablet (600 mg total) by mouth 2 (two) times daily before a meal.  . Insulin Glargine (LANTUS) 100 UNIT/ML Solostar Pen Inject 36 Units into the skin daily. Dr Honor Junes  . Interferon Beta-1b (BETASERON) 0.3  MG KIT injection Inject subcutaneously 1 syringe every other day.  . loratadine (CLARITIN) 10 MG tablet Take 10 mg by mouth daily.  . metFORMIN (GLUCOPHAGE) 500 MG tablet Take 500 mg by mouth daily with breakfast. Dr Honor Junes  . mupirocin ointment (BACTROBAN) 2 % Apply 1 application topically 2 (two) times daily. PRN  . mupirocin ointment (BACTROBAN) 2 % Apply 1 application topically 2 (two) times daily.  Marland Kitchen omeprazole (PRILOSEC) 40 MG capsule TAKE 1 CAPSULE(40 MG) BY MOUTH DAILY  . pregabalin (LYRICA) 200 MG capsule Take 1 capsule (200 mg total) by mouth 2 (two) times daily.  . sertraline (ZOLOFT) 50 MG tablet TAKE 1/2 TABLET(25 MG) BY MOUTH DAILY  . simvastatin (ZOCOR) 40 MG tablet Take 1 tablet (40 mg total) by mouth daily.  . traMADol (ULTRAM) 50 MG tablet Take 1 tablet (50 mg total) by mouth every 6 (six) hours as needed. for pain  . [DISCONTINUED] simvastatin (ZOCOR) 40 MG tablet TAKE 1 TABLET BY MOUTH DAILY   Current Facility-Administered Medications for the 11/06/18 encounter (Office Visit) with Juline Patch, MD  Medication  . cyanocobalamin ((VITAMIN B-12)) injection 1,000 mcg    PHQ 2/9 Scores 10/04/2018 07/06/2018 07/06/2018 09/29/2017  PHQ - 2 Score 0 0 0 0  PHQ- 9 Score 6 4 - -    Physical Exam Vitals signs and nursing note reviewed.  HENT:     Head: Normocephalic.     Right Ear: Hearing, tympanic membrane, ear canal and external ear normal.     Left Ear: Hearing, tympanic membrane, ear canal and external ear normal.     Nose:     Right Turbinates: Swollen. Not enlarged.     Left Turbinates: Swollen. Not enlarged.     Right Sinus: Maxillary sinus tenderness and frontal sinus tenderness present.     Left Sinus: Maxillary sinus tenderness and frontal sinus tenderness present.     Mouth/Throat:     Dentition: Normal dentition.     Pharynx: Uvula midline. Pharyngeal swelling present.  Eyes:     General: No scleral icterus.       Right eye: No discharge.        Left eye:  No discharge.     Conjunctiva/sclera: Conjunctivae normal.     Pupils: Pupils are equal, round, and reactive to light.  Neck:     Musculoskeletal: Normal range of motion and neck supple.     Thyroid: No thyromegaly.     Vascular: No JVD.     Trachea: No tracheal deviation.  Cardiovascular:     Rate and Rhythm: Normal rate and regular rhythm.     Heart sounds: Normal heart sounds. No murmur. No friction rub. No gallop.   Pulmonary:     Effort: No respiratory distress.     Breath sounds: Normal breath sounds. No wheezing or rales.  Abdominal:     General: Bowel sounds are normal.     Palpations: Abdomen is soft. There is no mass.     Tenderness: There is no abdominal tenderness. There is no guarding or rebound.  Musculoskeletal: Normal range of motion.        General: No tenderness.  Lymphadenopathy:     Cervical: No cervical adenopathy.  Skin:    General: Skin is warm.     Findings: No rash.  Neurological:     Mental Status: He is alert and oriented to person, place, and time.     Cranial Nerves: No cranial nerve deficit.     Deep Tendon Reflexes: Reflexes are normal and symmetric.     BP 130/62   Pulse 80   Ht 5' 10"  (1.778 m)   Wt 236 lb (107 kg)   BMI 33.86 kg/m   Assessment and Plan: 1. Acute pansinusitis, recurrence not specified Acute. Green production for 5 days. No fever. Start on Augmentin and return to clinic if not better. - amoxicillin-clavulanate (AUGMENTIN) 875-125 MG tablet; Take 1 tablet by mouth 2 (two) times daily.  Dispense: 20 tablet; Refill: 0

## 2018-11-08 NOTE — Telephone Encounter (Signed)
LVM for Jeremy [name] with [employer] requesting she call back to answer questions.

## 2018-11-08 NOTE — Telephone Encounter (Addendum)
Anderson Malta returned call. She stated the charity foundation is called [employer]. They provide co pay assistance after the regular insurance has paid. Anderson Malta advised I call wife and have her contact [employer] to begin process. She will need to provide all his insurance info. [facility] can then bill [employer] to regular insurance and bill remainder of co pay to [employer] as secondary payer to help with co pay. Thanked her for assistance. Called wife, [name] to advise. She stated that she has talked to [employer] and insurance. The medication costs  $16,000/ month.  His insurance will pay pay 67% of it, the patient's part is > $5000/month. His [place] for this year is $6000. She stated the funds will not be enough for the year. I advised will discuss with other nurses in office to get more information and call her back.  She verbalized understanding, appreciation. Discussed with Faith, RN and received multiple [place] assistance resources. Called [name] and advised her of new resources. She stated that she was on the phone 2 hours last week . She stated [employer] has granted him $6000 for this year, so she will call [facility] to process Rx. I advised her to have [facility] process through Medicare first then send remainder to [facility]. Advised her after those funds are depleted she can reach out to other resources. She understood she must do that, stated she will before his medication runs out.  I advised her a copy of resources will be put in the mail to her today. She verbalized understanding, appreciation. Document of resources placed in mail today.

## 2018-11-09 DIAGNOSIS — E1129 Type 2 diabetes mellitus with other diabetic kidney complication: Secondary | ICD-10-CM | POA: Diagnosis not present

## 2018-11-09 DIAGNOSIS — N183 Chronic kidney disease, stage 3 (moderate): Secondary | ICD-10-CM | POA: Diagnosis not present

## 2018-11-09 DIAGNOSIS — R600 Localized edema: Secondary | ICD-10-CM | POA: Diagnosis not present

## 2018-11-14 ENCOUNTER — Ambulatory Visit (INDEPENDENT_AMBULATORY_CARE_PROVIDER_SITE_OTHER): Payer: Medicare Other

## 2018-11-14 DIAGNOSIS — D519 Vitamin B12 deficiency anemia, unspecified: Secondary | ICD-10-CM | POA: Diagnosis not present

## 2018-11-14 DIAGNOSIS — E538 Deficiency of other specified B group vitamins: Secondary | ICD-10-CM

## 2018-11-14 NOTE — Progress Notes (Signed)
Patient came in today to received his monthly B12 injection. Took patient into ROOM1. Gave the injection at the L) Ventrogluteal site. Patient tolerated injection well. Patient went to front desk to schedule his next injection.

## 2018-11-15 DIAGNOSIS — E1142 Type 2 diabetes mellitus with diabetic polyneuropathy: Secondary | ICD-10-CM | POA: Diagnosis not present

## 2018-11-15 DIAGNOSIS — B351 Tinea unguium: Secondary | ICD-10-CM | POA: Diagnosis not present

## 2018-11-15 DIAGNOSIS — L600 Ingrowing nail: Secondary | ICD-10-CM | POA: Diagnosis not present

## 2018-11-19 ENCOUNTER — Other Ambulatory Visit: Payer: Self-pay | Admitting: Family Medicine

## 2018-11-19 DIAGNOSIS — K219 Gastro-esophageal reflux disease without esophagitis: Secondary | ICD-10-CM

## 2018-11-28 ENCOUNTER — Ambulatory Visit (INDEPENDENT_AMBULATORY_CARE_PROVIDER_SITE_OTHER): Payer: Medicare Other

## 2018-11-28 DIAGNOSIS — E538 Deficiency of other specified B group vitamins: Secondary | ICD-10-CM | POA: Diagnosis not present

## 2018-11-28 MED ORDER — CYANOCOBALAMIN 1000 MCG/ML IJ SOLN
1000.0000 ug | Freq: Once | INTRAMUSCULAR | Status: AC
  Administered 2018-11-28: 1000 ug via INTRAMUSCULAR

## 2018-12-04 ENCOUNTER — Other Ambulatory Visit: Payer: Self-pay | Admitting: Nurse Practitioner

## 2018-12-05 NOTE — Telephone Encounter (Signed)
Drug Registry checked.  Last fill 10-03-18 #120.

## 2018-12-12 ENCOUNTER — Ambulatory Visit (INDEPENDENT_AMBULATORY_CARE_PROVIDER_SITE_OTHER): Payer: Medicare Other

## 2018-12-12 DIAGNOSIS — E538 Deficiency of other specified B group vitamins: Secondary | ICD-10-CM | POA: Diagnosis not present

## 2018-12-12 MED ORDER — CYANOCOBALAMIN 1000 MCG/ML IJ SOLN
1000.0000 ug | Freq: Once | INTRAMUSCULAR | Status: AC
  Administered 2018-12-12: 1000 ug via INTRAMUSCULAR

## 2018-12-15 ENCOUNTER — Other Ambulatory Visit: Payer: Self-pay | Admitting: Family Medicine

## 2018-12-15 DIAGNOSIS — D649 Anemia, unspecified: Secondary | ICD-10-CM

## 2018-12-15 DIAGNOSIS — R601 Generalized edema: Secondary | ICD-10-CM

## 2018-12-26 ENCOUNTER — Ambulatory Visit (INDEPENDENT_AMBULATORY_CARE_PROVIDER_SITE_OTHER): Payer: Medicare Other

## 2018-12-26 DIAGNOSIS — E538 Deficiency of other specified B group vitamins: Secondary | ICD-10-CM

## 2018-12-26 MED ORDER — CYANOCOBALAMIN 1000 MCG/ML IJ SOLN
1000.0000 ug | Freq: Once | INTRAMUSCULAR | Status: AC
  Administered 2018-12-26: 1000 ug via INTRAMUSCULAR

## 2019-01-03 ENCOUNTER — Other Ambulatory Visit: Payer: Self-pay | Admitting: Family Medicine

## 2019-01-03 ENCOUNTER — Other Ambulatory Visit: Payer: Self-pay

## 2019-01-03 DIAGNOSIS — G63 Polyneuropathy in diseases classified elsewhere: Principal | ICD-10-CM

## 2019-01-03 DIAGNOSIS — G609 Hereditary and idiopathic neuropathy, unspecified: Secondary | ICD-10-CM

## 2019-01-03 DIAGNOSIS — E349 Endocrine disorder, unspecified: Secondary | ICD-10-CM

## 2019-01-03 MED ORDER — PREGABALIN 200 MG PO CAPS: 200.0000 mg | ORAL_CAPSULE | Freq: Two times a day (BID) | ORAL | 0 refills | Status: DC

## 2019-01-09 ENCOUNTER — Ambulatory Visit (INDEPENDENT_AMBULATORY_CARE_PROVIDER_SITE_OTHER): Payer: Medicare Other | Admitting: Family Medicine

## 2019-01-09 ENCOUNTER — Other Ambulatory Visit: Payer: Self-pay

## 2019-01-09 ENCOUNTER — Encounter: Payer: Self-pay | Admitting: Family Medicine

## 2019-01-09 VITALS — BP 130/64 | HR 64 | Ht 70.0 in | Wt 234.0 lb

## 2019-01-09 DIAGNOSIS — G609 Hereditary and idiopathic neuropathy, unspecified: Secondary | ICD-10-CM | POA: Diagnosis not present

## 2019-01-09 DIAGNOSIS — D649 Anemia, unspecified: Secondary | ICD-10-CM | POA: Diagnosis not present

## 2019-01-09 DIAGNOSIS — F331 Major depressive disorder, recurrent, moderate: Secondary | ICD-10-CM | POA: Diagnosis not present

## 2019-01-09 DIAGNOSIS — G63 Polyneuropathy in diseases classified elsewhere: Secondary | ICD-10-CM | POA: Diagnosis not present

## 2019-01-09 DIAGNOSIS — E782 Mixed hyperlipidemia: Secondary | ICD-10-CM

## 2019-01-09 DIAGNOSIS — Z23 Encounter for immunization: Secondary | ICD-10-CM

## 2019-01-09 DIAGNOSIS — D519 Vitamin B12 deficiency anemia, unspecified: Secondary | ICD-10-CM | POA: Diagnosis not present

## 2019-01-09 DIAGNOSIS — E349 Endocrine disorder, unspecified: Secondary | ICD-10-CM

## 2019-01-09 DIAGNOSIS — K219 Gastro-esophageal reflux disease without esophagitis: Secondary | ICD-10-CM | POA: Diagnosis not present

## 2019-01-09 DIAGNOSIS — R601 Generalized edema: Secondary | ICD-10-CM

## 2019-01-09 MED ORDER — CYANOCOBALAMIN 1000 MCG/ML IJ SOLN: 1000.0000 ug | INTRAMUSCULAR | 3 refills | Status: DC

## 2019-01-09 MED ORDER — LORATADINE 10 MG PO TABS: 10.0000 mg | ORAL_TABLET | Freq: Every day | ORAL | 1 refills | Status: DC

## 2019-01-09 MED ORDER — OMEPRAZOLE 40 MG PO CPDR: DELAYED_RELEASE_CAPSULE | ORAL | 1 refills | Status: DC

## 2019-01-09 MED ORDER — SERTRALINE HCL 50 MG PO TABS: ORAL_TABLET | ORAL | 1 refills | Status: DC

## 2019-01-09 MED ORDER — GEMFIBROZIL 600 MG PO TABS: 600.0000 mg | ORAL_TABLET | Freq: Two times a day (BID) | ORAL | 1 refills | Status: DC

## 2019-01-09 MED ORDER — SIMVASTATIN 40 MG PO TABS: 40.0000 mg | ORAL_TABLET | Freq: Every day | ORAL | 1 refills | Status: DC

## 2019-01-09 MED ORDER — PREGABALIN 200 MG PO CAPS: 200.0000 mg | ORAL_CAPSULE | Freq: Two times a day (BID) | ORAL | 0 refills | Status: DC

## 2019-01-09 MED ORDER — FUROSEMIDE 40 MG PO TABS: ORAL_TABLET | ORAL | 0 refills | Status: DC

## 2019-01-09 MED ORDER — CYANOCOBALAMIN 1000 MCG/ML IJ SOLN
1000.0000 ug | Freq: Once | INTRAMUSCULAR | Status: AC
  Administered 2019-01-09: 1000 ug via INTRAMUSCULAR

## 2019-01-09 MED ORDER — FERROUS SULFATE 325 (65 FE) MG PO TABS: ORAL_TABLET | ORAL | 1 refills | Status: DC

## 2019-01-09 NOTE — Progress Notes (Signed)
Date:  01/09/2019   Name:  Jeremy [name]   DOB:  Oct 09, 1951   MRN:  [id number]   Chief Complaint: Anemia; Leg Swelling; Depression; Allergic Rhinitis ; Hyperlipidemia; and Peripheral Neuropathy  Anemia  Presents for follow-up visit. There has been no abdominal pain, anorexia, bruising/bleeding easily, confusion, fever, leg swelling, light-headedness, malaise/fatigue, pallor, palpitations, paresthesias, pica or weight loss. Signs of blood loss that are not present include hematemesis, hematochezia, melena, menorrhagia and vaginal bleeding. There is no history of chronic renal disease or hypothyroidism. There are no compliance problems.   Depression         The patient presents with no depression.  This is a chronic problem.  The current episode started more than 1 year ago.   The onset quality is gradual.   The problem has been gradually improving since onset.  Associated symptoms include no decreased concentration, no fatigue, no helplessness, no hopelessness, does not have insomnia, not irritable, no restlessness, no decreased interest, no appetite change, no body aches, no myalgias, no headaches, no indigestion, not sad and no suicidal ideas.  Past treatments include SSRIs - Selective serotonin reuptake inhibitors.  Compliance with treatment is good.  Previous treatment provided mild relief.  Past medical history includes thyroid problem.     Pertinent negatives include no chronic fatigue syndrome, no hypothyroidism and no depression. Hyperlipidemia  This is a chronic problem. The current episode started more than 1 year ago. The problem is controlled. Recent lipid tests were reviewed and are normal. He has no history of chronic renal disease, diabetes, hypothyroidism, liver disease, obesity or nephrotic syndrome. There are no known factors aggravating his hyperlipidemia. Pertinent negatives include no chest pain, focal sensory loss, focal weakness, leg pain, myalgias or shortness of breath. He is  currently on no antihyperlipidemic treatment. The current treatment provides moderate improvement of lipids. There are no compliance problems.  Risk factors for coronary artery disease include dyslipidemia.  Thyroid Problem  Presents for follow-up visit. Patient reports no anxiety, cold intolerance, constipation, depressed mood, diaphoresis, diarrhea, dry skin, fatigue, hair loss, heat intolerance, hoarse voice, leg swelling, nail problem, palpitations, tremors, visual change, weight gain or weight loss. The symptoms have been improving. His past medical history is significant for hyperlipidemia. There is no history of diabetes.    Review of Systems  Constitutional: Negative for appetite change, chills, diaphoresis, fatigue, fever, malaise/fatigue, weight gain and weight loss.  HENT: Negative for drooling, ear discharge, ear pain, hoarse voice, postnasal drip, rhinorrhea and sore throat.   Eyes: Negative for visual disturbance.  Respiratory: Negative for cough, shortness of breath and wheezing.   Cardiovascular: Negative for chest pain, palpitations and leg swelling.  Gastrointestinal: Negative for abdominal pain, anorexia, blood in stool, constipation, diarrhea, hematemesis, hematochezia, melena and nausea.  Endocrine: Negative for cold intolerance, heat intolerance and polydipsia.  Genitourinary: Negative for dysuria, frequency, hematuria, menorrhagia, urgency and vaginal bleeding.  Musculoskeletal: Negative for back pain, myalgias and neck pain.  Skin: Negative for pallor and rash.  Allergic/Immunologic: Negative for environmental allergies.  Neurological: Negative for dizziness, tremors, focal weakness, light-headedness, headaches and paresthesias.  Hematological: Does not bruise/bleed easily.  Psychiatric/Behavioral: Positive for depression. Negative for confusion, decreased concentration and suicidal ideas. The patient is not nervous/anxious and does not have insomnia.     Patient Active  Problem List   Diagnosis Date Noted  . Therapeutic drug monitoring 03/23/2018  . Mild episode of recurrent major depressive disorder ([place]) 09/26/2017  . Ventricular ectopic beats 09/26/2017  .  Type 2 diabetes mellitus with diabetic polyneuropathy, with long-term current use of insulin ([place]) 08/12/2017  . Hyperlipidemia due to type 2 diabetes mellitus ([place]) 08/12/2017  . B12 deficiency 12/14/2016  . Low back pain 03/10/2016  . Lumbosacral disc disease 01/01/2016  . Neuropathy associated with endocrine disorder ([place]) 11/19/2015  . B12 deficiency anemia 06/03/2015  . Essential hypertension 06/03/2015  . Hyperlipidemia 06/03/2015  . Depression 06/03/2015  . Gastroesophageal reflux disease without esophagitis 06/03/2015  . Edema extremities 06/03/2015  . Multiple sclerosis ([place]) 07/26/2013  . Abnormality of gait 07/26/2013  . Morbid obesity ([place]) 07/26/2013    Allergies  Allergen Reactions  . Betadine [Povidone Iodine]     Past Surgical History:  Procedure Laterality Date  . COLECTOMY  06-2008  . COLONOSCOPY  2015   normal    Social History   Tobacco Use  . Smoking status: Former Smoker    Packs/day: 1.50    Years: 30.00    Pack years: 45.00    Types: Cigarettes    Last attempt to quit: 01/23/2006    Years since quitting: 12.9  . Smokeless tobacco: Never Used  . Tobacco comment: N/A  Substance Use Topics  . Alcohol use: Yes    Alcohol/week: 0.0 standard drinks    Comment: rare; maybe 2 beers a year  . Drug use: No     Medication list has been reviewed and updated.  Current Meds  Medication Sig  . aspirin 81 MG tablet Take 81 mg by mouth daily.  . cyanocobalamin (,VITAMIN B-12,) 1000 MCG/ML injection Inject into the muscle.  . docusate sodium (COLACE) 50 MG capsule Take 50 mg by mouth daily. otc   . FEROSUL 325 (65 Fe) MG tablet TAKE 1 TABLET(325 MG) BY MOUTH THREE TIMES DAILY  . furosemide (LASIX) 40 MG tablet TAKE 1 TABLET(40 MG) BY MOUTH TWICE DAILY  .  gemfibrozil (LOPID) 600 MG tablet Take 1 tablet (600 mg total) by mouth 2 (two) times daily before a meal.  . Insulin Glargine (LANTUS) 100 UNIT/ML Solostar Pen Inject 36 Units into the skin daily. Dr Honor Junes  . Interferon Beta-1b (BETASERON) 0.3 MG KIT injection Inject subcutaneously 1 syringe every other day.  . loratadine (CLARITIN) 10 MG tablet Take 10 mg by mouth daily.  . metFORMIN (GLUCOPHAGE) 500 MG tablet Take 500 mg by mouth daily with breakfast. Dr Honor Junes  . mupirocin ointment (BACTROBAN) 2 % Apply 1 application topically 2 (two) times daily. PRN  . omeprazole (PRILOSEC) 40 MG capsule TAKE 1 CAPSULE(40 MG) BY MOUTH DAILY  . pregabalin (LYRICA) 200 MG capsule Take 1 capsule (200 mg total) by mouth 2 (two) times daily.  . sertraline (ZOLOFT) 50 MG tablet TAKE 1/2 TABLET(25 MG) BY MOUTH DAILY  . simvastatin (ZOCOR) 40 MG tablet Take 1 tablet (40 mg total) by mouth daily.  . traMADol (ULTRAM) 50 MG tablet Take 1 tablet (50 mg total) by mouth every 6 (six) hours as needed. for pain    PHQ 2/9 Scores 10/04/2018 07/06/2018 07/06/2018 09/29/2017  PHQ - 2 Score 0 0 0 0  PHQ- 9 Score 6 4 - -    Physical Exam Vitals signs and nursing note reviewed.  Constitutional:      General: He is not irritable. HENT:     Head: Normocephalic.     Right Ear: Tympanic membrane, ear canal and external ear normal.     Left Ear: Tympanic membrane, ear canal and external ear normal.     Nose: Nose  normal. No congestion or rhinorrhea.  Eyes:     General: No scleral icterus.       Right eye: No discharge.        Left eye: No discharge.     Conjunctiva/sclera: Conjunctivae normal.     Pupils: Pupils are equal, round, and reactive to light.  Neck:     Musculoskeletal: Normal range of motion and neck supple.     Thyroid: No thyromegaly.     Vascular: No JVD.     Trachea: No tracheal deviation.  Cardiovascular:     Rate and Rhythm: Normal rate and regular rhythm.     Heart sounds: Normal heart  sounds. No murmur. No friction rub. No gallop.   Pulmonary:     Effort: No respiratory distress.     Breath sounds: Normal breath sounds. No wheezing or rales.  Abdominal:     General: Bowel sounds are normal.     Palpations: Abdomen is soft. There is no mass.     Tenderness: There is no abdominal tenderness. There is no guarding or rebound.  Musculoskeletal: Normal range of motion.        General: No tenderness.  Lymphadenopathy:     Cervical: No cervical adenopathy.  Skin:    General: Skin is warm.     Findings: No rash.  Neurological:     Mental Status: He is alert and oriented to person, place, and time.     Cranial Nerves: No cranial nerve deficit.     Deep Tendon Reflexes: Reflexes are normal and symmetric.     Wt Readings from Last 3 Encounters:  01/09/19 234 lb (106.1 kg)  11/06/18 236 lb (107 kg)  10/04/18 236 lb 9.6 oz (107.3 kg)    BP 130/64   Pulse 64   Ht 5' 10"  (1.778 m)   Wt 234 lb (106.1 kg)   BMI 33.58 kg/m   Assessment and Plan: 1. Anemia, unspecified type Chronic.  Controlled.  Continue ferrous sulfate 325 3 times a day. - ferrous sulfate (FEROSUL) 325 (65 FE) MG tablet; TAKE 1 TABLET(325 MG) BY MOUTH THREE TIMES DAILY  Dispense: 270 tablet; Refill: 1  2. Gastroesophageal reflux disease without esophagitis .  Controlled.  Continue Prilosec once a day. - omeprazole (PRILOSEC) 40 MG capsule; One a day  Dispense: 90 capsule; Refill: 1  3. Generalized edema Chronic.  Controlled.  Continue Lasix 40 mg once a day. - furosemide (LASIX) 40 MG tablet; TAKE 1 TABLET(40 MG) by mouth once a day  Dispense: 180 tablet; Refill: 0  4. 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  Controlled.  Will continue Lyrica 200 mg twice a day. - pregabalin (LYRICA) 200 MG capsule; Take 1 capsule (200 mg total) by mouth 2 (two) times daily.  Dispense: 60 capsule; Refill: 0  5. Idiopathic peripheral neuropathy See above. - pregabalin (LYRICA) 200 MG  capsule; Take 1 capsule (200 mg total) by mouth 2 (two) times daily.  Dispense: 60 capsule; Refill: 0  6. Mixed hyperlipidemia .  Controlled.  Continue gemfibrozil 600 mg twice a day and simvastatin 40 mg once a day - gemfibrozil (LOPID) 600 MG tablet; Take 1 tablet (600 mg total) by mouth 2 (two) times daily before a meal.  Dispense: 180 tablet; Refill: 1 - simvastatin (ZOCOR) 40 MG tablet; Take 1 tablet (40 mg total) by mouth daily.  Dispense: 90 tablet; Refill: 1  7. 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  Controlled.  Continue sertraline  one half of a 50 mg tablet for total 20 mg once a day. - sertraline (ZOLOFT) 50 MG tablet; TAKE 1/2 TABLET(25 MG) BY MOUTH DAILY  Dispense: 45 tablet; Refill: 1  8. Morbid obesity ([place]) Controlled.  Patient has been encouraged to lose weight  9. Need for pneumococcal vaccination Gust and administered. - Pneumococcal polysaccharide vaccine 23-valent greater than or equal to 2yo subcutaneous/IM  10. Anemia due to vitamin B12 deficiency, unspecified B12 deficiency type Has a history of B12 deficiency for which we will his wife administer his B12 injections on an 3 other week basis. - cyanocobalamin ((VITAMIN B-12)) injection 1,000 mcg

## 2019-02-14 ENCOUNTER — Other Ambulatory Visit: Payer: Self-pay

## 2019-02-14 DIAGNOSIS — D518 Other vitamin B12 deficiency anemias: Secondary | ICD-10-CM

## 2019-02-14 MED ORDER — CYANOCOBALAMIN 1000 MCG/ML IJ SOLN: 1000.0000 ug | INTRAMUSCULAR | 3 refills | Status: DC

## 2019-03-09 DIAGNOSIS — E1169 Type 2 diabetes mellitus with other specified complication: Secondary | ICD-10-CM | POA: Diagnosis not present

## 2019-03-09 DIAGNOSIS — E785 Hyperlipidemia, unspecified: Secondary | ICD-10-CM | POA: Diagnosis not present

## 2019-03-09 DIAGNOSIS — D649 Anemia, unspecified: Secondary | ICD-10-CM | POA: Diagnosis not present

## 2019-03-09 DIAGNOSIS — E1142 Type 2 diabetes mellitus with diabetic polyneuropathy: Secondary | ICD-10-CM | POA: Diagnosis not present

## 2019-03-09 DIAGNOSIS — Z794 Long term (current) use of insulin: Secondary | ICD-10-CM | POA: Diagnosis not present

## 2019-04-05 ENCOUNTER — Ambulatory Visit: Payer: Medicare Other | Admitting: Neurology

## 2019-04-23 DIAGNOSIS — E1129 Type 2 diabetes mellitus with other diabetic kidney complication: Secondary | ICD-10-CM | POA: Diagnosis not present

## 2019-04-23 DIAGNOSIS — R609 Edema, unspecified: Secondary | ICD-10-CM | POA: Diagnosis not present

## 2019-04-23 DIAGNOSIS — I270 Primary pulmonary hypertension: Secondary | ICD-10-CM | POA: Diagnosis not present

## 2019-04-23 DIAGNOSIS — N183 Chronic kidney disease, stage 3 (moderate): Secondary | ICD-10-CM | POA: Diagnosis not present

## 2019-05-01 ENCOUNTER — Encounter: Payer: Self-pay | Admitting: Neurology

## 2019-05-01 ENCOUNTER — Ambulatory Visit (INDEPENDENT_AMBULATORY_CARE_PROVIDER_SITE_OTHER): Payer: Medicare Other | Admitting: Neurology

## 2019-05-01 ENCOUNTER — Other Ambulatory Visit: Payer: Self-pay

## 2019-05-01 VITALS — BP 128/70 | HR 78 | Temp 98.2°F | Ht 70.0 in | Wt 233.0 lb

## 2019-05-01 DIAGNOSIS — G35 Multiple sclerosis: Secondary | ICD-10-CM | POA: Diagnosis not present

## 2019-05-01 DIAGNOSIS — R269 Unspecified abnormalities of gait and mobility: Secondary | ICD-10-CM

## 2019-05-01 MED ORDER — BETASERON 0.3 MG ~~LOC~~ KIT: PACK | SUBCUTANEOUS | 4 refills | Status: DC

## 2019-05-01 MED ORDER — TRAMADOL HCL 50 MG PO TABS: 50.0000 mg | ORAL_TABLET | Freq: Four times a day (QID) | ORAL | 5 refills | Status: DC | PRN

## 2019-05-01 NOTE — Progress Notes (Signed)
 [facility]  PATIENT: Jeremy [name] DOB: 05/22/1951   REASON FOR VISIT: Follow-up for multiple sclerosis gait abnormality, HISTORY FROM: Patient and wife Jeremy [name]    HISTORY OF PRESENT ILLNESS:HISTORY:He has PMHx of diabetes, hypertension and multiple sclerosis. The diagnosis of multiple sclerosis was confirmed by abnormal MRI brain and cervical, and a spinal fluid testing. He was initially followed by Dr. Champey in 2008. Last seen in this office Oct 2014, He continues to have gait difficulties and relies on a cane for ambulation. He has had no problems with his vision or bowel/bladder control.  He has had occasional falls.He has ongoing pain in his legs. Last MS flare was over 2 years ago. He is currently on Betaseron every other day, treatment started in 2007.   MRI cervical in 2013 with findings consistent with MS plaques and no enhancement,mild spinal stenosis.  MRI of the brain with prominent white matter changes consistent with MS. No active areas of demyelination. He continues to have problems with low   The initial symptom was acute onset of left leg more than arm weakness, gait difficulty in 2006. This eventually led to the diagnosis of relapsing remitting multiple sclerosis. He has been receiving Betaseron treatment since early 2007. He has tolerated the medication well. He has had no problems with lipoatrophy or skin necrosis. In September 2009, he developed GI bleeding. Workup had demonstrated colon inflammation and obstruction.He underwent colectomy and recovered well from that procedure.  He continues to have gait difficulties and relies on a cane for ambulation. He has had no problems with his vision or bowel/bladder control. He does have chronic extremity pain, which has been managed with Lyrica and Tramadol.   He uses an electric scooter for situations in which he would need to walk long distances. He has had occasional falls.He has ongoing  pain in his legs  UPDATE 07/2014: He was admitted to hospital in Nov 2014 for anemia, low blood count, was evaluated by hematologist in [place], now has recovered, he still has moderate gait difficulty.also complains bilateral lower extremity spasticity, is taking Lyrica, there was no clinical flareups, last MRI was in 2013, there was significant lesions in brain, and cervical spine  UPDATE 09/03/2014: He has tried Baclofen 10mg qhs, complains of excessive sleepiness, could not tolerate it, he still mows the yard, the most limitation is his gait difficulty,he denies bowel and bladder incontinence.  We have reviewed MRI togather, MRI scan of brain showing periventricular and subcortical white matter hyperintensities consistent with multiple sclerosis. Presence of atrophy of corpus callosum indicates chronic disease. No significant change compared with MRI 06/02/2012   MRI cervical spine showing mild spondylitic changes descibed above most prominent at C 3-4 where there is moderate left formaminal narrowing. Ill defined spinal cord hypertintensities at C 3-4 to c 6-7 likely represent chronic multiple sclerosis inactive plaques. No significant change compared with MRI C spine 06/02/2012  UPDATE 11//17/16 [name], 68-year-old male returns for follow-up. He was last seen in this office 03/06/15.  He was diagnosed with multiple sclerosis in 2006. He is currently on Betaseron every other day without injection site issues.. He still remains fairly active mowing the yard, denies any bowel or bladder incontinence. He denies any sensory changes, double vision speech or swallowing difficulty. He ambulates with a cane. He has had a couple of falls since last seen. He returns for reevaluation  UPDATE May 17th 2017:YY He is with his wife at today's clinical visit, he continued to receive   Betaseron every other day without significant side effect, he has significant gait difficulty, left side is weaker, complains of  chronic fatigue, chronic low back pain, bilateral lower extremity neuropathic pain, taking Lyrica, tramadol as needed, Reviewed laboratory evaluation, A1c 7.9, CMP showed elevated creatinine 1.4, normal CBC We again personally reviewed MRI of the scan without contrast, most recent was in 2015, MRI of the brain showed multiple supratentorium lesions, no contrast enhancement, MRI of cervical spine showed ill-defined C3-C7 cord lesion, no contrast enhancement, no significant change compared to previous scans   Update September 22, 2017:YY Creatinine 1.27 in July 2018, he is overall doing very well, mild gait abnormality, use cane, still active at home, providing normal, no bowel bladder incontinence, diabetes, diabetic peripheral neuropathy, using Betaseron, last MRI was in 2015,  UPDATE May 01 2019: He is overall stable, continue ambulate with a cane, slow worsening gait abnormality, heat intolerance, I personally reviewed MRI of the brain with without contrast in December 2018: Multiple subcortical, periventricular nonenhancing lesion, no significant change compared to previous scan in October 2015  Laboratory evaluations in September 2019: Hg 12.4,  LDL 84, A1C 5.7  REVIEW OF SYSTEMS: Full 14 system review of systems performed and notable only for those listed, all others are neg:  As above  ALLERGIES: Allergies  Allergen Reactions  . Betadine [Povidone Iodine]     HOME MEDICATIONS: Outpatient Medications Prior to Visit  Medication Sig Dispense Refill  . aspirin 81 MG tablet Take 81 mg by mouth daily.    . cyanocobalamin (,VITAMIN B-12,) 1000 MCG/ML injection Inject 1 mL (1,000 mcg total) into the muscle every 14 (fourteen) days. 2 mL 3  . docusate sodium (COLACE) 50 MG capsule Take 50 mg by mouth daily. otc     . ferrous sulfate (FEROSUL) 325 (65 FE) MG tablet TAKE 1 TABLET(325 MG) BY MOUTH THREE TIMES DAILY 270 tablet 1  . furosemide (LASIX) 40 MG tablet TAKE 1 TABLET(40 MG) by mouth  once a day 180 tablet 0  . gemfibrozil (LOPID) 600 MG tablet Take 1 tablet (600 mg total) by mouth 2 (two) times daily before a meal. 180 tablet 1  . Insulin Glargine (LANTUS) 100 UNIT/ML Solostar Pen Inject 36 Units into the skin daily. Dr Honor Junes    . Interferon Beta-1b (BETASERON) 0.3 MG KIT injection Inject subcutaneously 1 syringe every other day. 15 vial 11  . loratadine (CLARITIN) 10 MG tablet Take 1 tablet (10 mg total) by mouth daily. 90 tablet 1  . metFORMIN (GLUCOPHAGE) 500 MG tablet Take 500 mg by mouth daily with breakfast. Dr Honor Junes    . mupirocin ointment (BACTROBAN) 2 % Apply 1 application topically 2 (two) times daily. PRN    . omeprazole (PRILOSEC) 40 MG capsule One a day 90 capsule 1  . pregabalin (LYRICA) 200 MG capsule Take 1 capsule (200 mg total) by mouth 2 (two) times daily. 60 capsule 0  . sertraline (ZOLOFT) 50 MG tablet TAKE 1/2 TABLET(25 MG) BY MOUTH DAILY 45 tablet 1  . simvastatin (ZOCOR) 40 MG tablet Take 1 tablet (40 mg total) by mouth daily. 90 tablet 1  . traMADol (ULTRAM) 50 MG tablet Take 1 tablet (50 mg total) by mouth every 6 (six) hours as needed. for pain 120 tablet 3   No facility-administered medications prior to visit.     PAST MEDICAL HISTORY: Past Medical History:  Diagnosis Date  . Chronic pain   . Depression   . Diabetes ([place])   .  GERD (gastroesophageal reflux disease)   . Hyperlipemia   . Hypertension   . MS (multiple sclerosis) ([place])     PAST SURGICAL HISTORY: Past Surgical History:  Procedure Laterality Date  . COLECTOMY  06-2008  . COLONOSCOPY  2015   normal    FAMILY HISTORY: Family History  Problem Relation Age of Onset  . Lung cancer Mother   . Heart attack Father   . Heart attack Brother   . COPD Brother     SOCIAL HISTORY: Social History   Socioeconomic History  . Marital status: Married    Spouse name: [name]  . Number of children: 2  . Years of education: GED  . Highest education level: Not on file   Occupational History    Comment: Disabled  Social Needs  . Financial resource strain: Not hard at all  . Food insecurity    Worry: Never true    Inability: Never true  . Transportation needs    Medical: No    Non-medical: No  Tobacco Use  . Smoking status: Former Smoker    Packs/day: 1.50    Years: 30.00    Pack years: 45.00    Types: Cigarettes    Quit date: 01/23/2006    Years since quitting: 13.2  . Smokeless tobacco: Never Used  . Tobacco comment: N/A  Substance and Sexual Activity  . Alcohol use: Yes    Alcohol/week: 0.0 standard drinks    Comment: rare; maybe 2 beers a year  . Drug use: No  . Sexual activity: Not Currently  Lifestyle  . Physical activity    Days per week: 0 days    Minutes per session: 0 min  . Stress: Not at all  Relationships  . Social connections    Talks on phone: More than three times a week    Gets together: Once a week    Attends religious service: More than 4 times per year    Active member of club or organization: No    Attends meetings of clubs or organizations: Never    Relationship status: Married  . Intimate partner violence    Fear of current or ex partner: No    Emotionally abused: No    Physically abused: No    Forced sexual activity: No  Other Topics Concern  . Not on file  Social History Narrative   Patient is disabled.    Patient lives with his wife Olis Viverette.    Patient has 2 children.         PHYSICAL EXAM  Vitals:   05/01/19 1430  BP: 128/70  Pulse: 78  Temp: 98.2 F (36.8 C)  Weight: 233 lb (105.7 kg)  Height: '5\' 10"'$  (1.778 m)   Body mass index is 33.43 kg/m.  Generalized: Well developed, obese male in no acute distress  Head: normocephalic and atraumatic,. Oropharynx benign  Neck: Supple,  Musculoskeletal: Mild left hemiparesis  Neurological examination   Mentation: Alert oriented to time, place, history taking. Attention span and concentration appropriate. Recent and remote memory intact.   Follows all commands speech and language fluent.   Cranial nerve II-XII:  Pupils were equal round reactive to light extraocular movements were full, visual field were full on confrontational test. Facial sensation and strength were normal. hearing was intact to finger rubbing bilaterally. Uvula tongue midline. head turning and shoulder shrug were normal and symmetric.Tongue protrusion into cheek strength was normal. Motor: Mild left-sided weakness Sensory: Length dependent decreased  light touch, pinprick,  and  Vibration,  to mid shin  Coordination: finger-nose-finger, heel-to-shin bilaterally, no dysmetria Reflexes: Hyperreflexia of both lower extremities, plantar responses were flexor bilaterally. Gait and Station:Need to push up to get up from seated position wide based, stiff, unsteady gait ambulates with single-point cane DIAGNOSTIC DATA (LABS, IMAGING, TESTING) - I reviewed patient records, labs, notes, testing and imaging myself where available.  Lab Results  Component Value Date   WBC 4.3 07/06/2018   HGB 12.4 (L) 07/06/2018   HCT 36.6 (L) 07/06/2018   MCV 94 07/06/2018   PLT 188 07/06/2018      Component Value Date/Time   NA 142 03/23/2018 1353   NA 143 08/31/2013 0417   K 4.4 03/23/2018 1353   K 3.5 08/31/2013 0417   CL 102 03/23/2018 1353   CL 109 (H) 08/31/2013 0417   CO2 23 03/23/2018 1353   CO2 28 08/31/2013 0417   GLUCOSE 146 (H) 03/23/2018 1353   GLUCOSE 133 (H) 08/31/2013 0417   BUN 22 03/23/2018 1353   BUN 39 (H) 08/31/2013 0417   CREATININE 1.29 (H) 03/23/2018 1353   CREATININE 1.53 (H) 08/31/2013 0417   CALCIUM 9.9 03/23/2018 1353   CALCIUM 8.9 08/31/2013 0417   PROT 7.4 07/06/2018 1102   PROT 6.2 (L) 08/31/2013 0417   ALBUMIN 4.5 07/06/2018 1102   ALBUMIN 2.9 (L) 08/31/2013 0417   AST 19 07/06/2018 1102   AST 35 08/31/2013 0417   ALT 13 07/06/2018 1102   ALT 15 08/31/2013 0417   ALKPHOS 68 07/06/2018 1102   ALKPHOS 60 08/31/2013 0417   BILITOT 0.4  07/06/2018 1102   BILITOT 0.6 08/31/2013 0417   GFRNONAA 58 (L) 03/23/2018 1353   GFRNONAA 48 (L) 08/31/2013 0417   GFRAA 67 03/23/2018 1353   GFRAA 56 (L) 08/31/2013 0417   Lab Results  Component Value Date   CHOL 155 07/06/2018   HDL 32 (L) 07/06/2018   LDLCALC 84 07/06/2018   TRIG 194 (H) 07/06/2018   CHOLHDL 6.1 (H) 08/06/2016   Lab Results  Component Value Date   HGBA1C 5.7 06/30/2018    ASSESSMENT AND PLAN Relapsing Remitting Multiple sclerosis Gait abnormality  Most recent MRI was in 2018, showed multiple subcortical, periventricular nonenhancing lesion, no change compared to previous scan in 2015  Continue Betaseron  Tramadol as needed for pain  Continue moderate exercise  Marcial Pacas, M.D. Ph.D.  [facility] [place], [place] [address] Phone: [phone] Fax:      [phone]

## 2019-05-02 ENCOUNTER — Telehealth: Payer: Self-pay | Admitting: Neurology

## 2019-05-02 NOTE — Telephone Encounter (Signed)
Pt's wife called stating that the pt's Interferon Beta-1b (BETASERON) 0.3 MG KIT injection was to be sent to [facility] [phone] not to [facility]. Please advise.

## 2019-05-02 NOTE — Telephone Encounter (Signed)
Pt's wife returned call. Please call back when available.  °

## 2019-05-02 NOTE — Telephone Encounter (Signed)
The prescription was transmitted correctly to [facility]) on 05/01/2019.  I also called the pharmacy at [phone] and they confirmed receipt of the rx.  They are processing it through the patient's insurance company and will be calling them to schedule delivery.  I returned the call to the patient's wife.  I left a message updating her with this information.  I provided [facility] number and our office number.

## 2019-05-02 NOTE — Telephone Encounter (Signed)
I called the patient again and had to leave another message. I provided the same details below.  If she calls back again, please let her know that [facility] pharmacy has his Betaseron prescription.  They are getting it authorized with his insurance then will call them to set up delivery.  If she would like to call the pharmacy to check on the delivery process, she can reach them at [phone].

## 2019-05-03 DIAGNOSIS — R600 Localized edema: Secondary | ICD-10-CM | POA: Diagnosis not present

## 2019-05-03 DIAGNOSIS — I129 Hypertensive chronic kidney disease with stage 1 through stage 4 chronic kidney disease, or unspecified chronic kidney disease: Secondary | ICD-10-CM | POA: Diagnosis not present

## 2019-05-03 DIAGNOSIS — E1129 Type 2 diabetes mellitus with other diabetic kidney complication: Secondary | ICD-10-CM | POA: Diagnosis not present

## 2019-05-03 DIAGNOSIS — N182 Chronic kidney disease, stage 2 (mild): Secondary | ICD-10-CM | POA: Diagnosis not present

## 2019-05-07 MED ORDER — BETASERON 0.3 MG ~~LOC~~ KIT: PACK | SUBCUTANEOUS | 4 refills | Status: DC

## 2019-05-07 NOTE — Telephone Encounter (Signed)
His Betaseron has been sent to the requested pharmacy below.

## 2019-05-07 NOTE — Telephone Encounter (Signed)
Pt called in and stated meds were sent to wrong pharmacy , they need to be sent to   [facility] [place], [place] - [address] [place], [place] - [address]

## 2019-05-14 ENCOUNTER — Other Ambulatory Visit: Payer: Self-pay

## 2019-05-14 DIAGNOSIS — D518 Other vitamin B12 deficiency anemias: Secondary | ICD-10-CM

## 2019-05-14 MED ORDER — CYANOCOBALAMIN 1000 MCG/ML IJ SOLN: 1000.0000 ug | INTRAMUSCULAR | 3 refills | Status: DC

## 2019-05-23 DIAGNOSIS — E1142 Type 2 diabetes mellitus with diabetic polyneuropathy: Secondary | ICD-10-CM | POA: Diagnosis not present

## 2019-05-23 DIAGNOSIS — B351 Tinea unguium: Secondary | ICD-10-CM | POA: Diagnosis not present

## 2019-06-05 ENCOUNTER — Other Ambulatory Visit: Payer: Self-pay

## 2019-06-05 DIAGNOSIS — D649 Anemia, unspecified: Secondary | ICD-10-CM

## 2019-06-05 DIAGNOSIS — E782 Mixed hyperlipidemia: Secondary | ICD-10-CM

## 2019-06-05 MED ORDER — SIMVASTATIN 40 MG PO TABS: 40.0000 mg | ORAL_TABLET | Freq: Every day | ORAL | 0 refills | Status: DC

## 2019-06-05 MED ORDER — FERROUS SULFATE 325 (65 FE) MG PO TABS: ORAL_TABLET | ORAL | 0 refills | Status: DC

## 2019-07-11 ENCOUNTER — Other Ambulatory Visit: Payer: Self-pay | Admitting: Family Medicine

## 2019-07-16 ENCOUNTER — Ambulatory Visit (INDEPENDENT_AMBULATORY_CARE_PROVIDER_SITE_OTHER): Payer: Medicare Other | Admitting: Family Medicine

## 2019-07-16 ENCOUNTER — Other Ambulatory Visit: Payer: Self-pay

## 2019-07-16 ENCOUNTER — Encounter: Payer: Self-pay | Admitting: Family Medicine

## 2019-07-16 VITALS — BP 140/62 | HR 80 | Resp 16 | Ht 70.0 in | Wt 232.0 lb

## 2019-07-16 DIAGNOSIS — Z23 Encounter for immunization: Secondary | ICD-10-CM | POA: Diagnosis not present

## 2019-07-16 DIAGNOSIS — E349 Endocrine disorder, unspecified: Secondary | ICD-10-CM | POA: Diagnosis not present

## 2019-07-16 DIAGNOSIS — E782 Mixed hyperlipidemia: Secondary | ICD-10-CM

## 2019-07-16 DIAGNOSIS — D649 Anemia, unspecified: Secondary | ICD-10-CM

## 2019-07-16 DIAGNOSIS — R601 Generalized edema: Secondary | ICD-10-CM | POA: Diagnosis not present

## 2019-07-16 DIAGNOSIS — R69 Illness, unspecified: Secondary | ICD-10-CM | POA: Diagnosis not present

## 2019-07-16 DIAGNOSIS — G609 Hereditary and idiopathic neuropathy, unspecified: Secondary | ICD-10-CM

## 2019-07-16 DIAGNOSIS — K219 Gastro-esophageal reflux disease without esophagitis: Secondary | ICD-10-CM

## 2019-07-16 DIAGNOSIS — F331 Major depressive disorder, recurrent, moderate: Secondary | ICD-10-CM | POA: Diagnosis not present

## 2019-07-16 DIAGNOSIS — G63 Polyneuropathy in diseases classified elsewhere: Secondary | ICD-10-CM | POA: Diagnosis not present

## 2019-07-16 MED ORDER — SIMVASTATIN 40 MG PO TABS: 40.0000 mg | ORAL_TABLET | Freq: Every day | ORAL | 1 refills | Status: DC

## 2019-07-16 MED ORDER — GEMFIBROZIL 600 MG PO TABS: 600.0000 mg | ORAL_TABLET | Freq: Two times a day (BID) | ORAL | 1 refills | Status: DC

## 2019-07-16 MED ORDER — FERROUS SULFATE 325 (65 FE) MG PO TABS: ORAL_TABLET | ORAL | 1 refills | Status: DC

## 2019-07-16 MED ORDER — SERTRALINE HCL 50 MG PO TABS: ORAL_TABLET | ORAL | 1 refills | Status: DC

## 2019-07-16 MED ORDER — OMEPRAZOLE 40 MG PO CPDR: DELAYED_RELEASE_CAPSULE | ORAL | 1 refills | Status: DC

## 2019-07-16 MED ORDER — PREGABALIN 200 MG PO CAPS: 200.0000 mg | ORAL_CAPSULE | Freq: Two times a day (BID) | ORAL | 5 refills | Status: DC

## 2019-07-16 MED ORDER — FUROSEMIDE 40 MG PO TABS: ORAL_TABLET | ORAL | 1 refills | Status: DC

## 2019-07-16 NOTE — Progress Notes (Signed)
Date:  07/16/2019   Name:  Jeremy [name]   DOB:  1951-10-08   MRN:  [id number]   Chief Complaint: Anemia, Edema (lower extremities), Hyperlipidemia, Depression, Gastroesophageal Reflux, Peripheral Neuropathy, and influ vacc need  Anemia Presents for follow-up visit. Symptoms include leg swelling. There has been no abdominal pain, anorexia, bruising/bleeding easily, confusion, fever, light-headedness, malaise/fatigue, pallor, palpitations, paresthesias, pica or weight loss. Signs of blood loss that are not present include hematemesis, hematochezia, melena, menorrhagia and vaginal bleeding. Past medical history includes chronic renal disease. There is no history of hypothyroidism. There are no compliance problems.   Hyperlipidemia This is a chronic problem. The current episode started more than 1 year ago. The problem is controlled. Recent lipid tests were reviewed and are normal. Exacerbating diseases include chronic renal disease, diabetes and obesity. He has no history of hypothyroidism, liver disease or nephrotic syndrome. Factors aggravating his hyperlipidemia include thiazides. Pertinent negatives include no chest pain, focal sensory loss, focal weakness, leg pain, myalgias or shortness of breath. Current antihyperlipidemic treatment includes statins and fibric acid derivatives. The current treatment provides moderate improvement of lipids. There are no compliance problems.  Risk factors for coronary artery disease include dyslipidemia, hypertension, male sex, diabetes mellitus and obesity.  Depression      The patient presents with depression.(Neuropathy)  This is a recurrent problem.  The onset quality is gradual.   The problem has been gradually improving since onset.  Associated symptoms include fatigue.  Associated symptoms include no decreased concentration, no helplessness, no hopelessness, does not have insomnia, not irritable, no restlessness, no decreased interest, no appetite change, no  body aches, no myalgias, no headaches, no indigestion, not sad and no suicidal ideas.     Exacerbated by: medical concerns.  Past treatments include SSRIs - Selective serotonin reuptake inhibitors.  Compliance with treatment is good.  Previous treatment provided mild relief.  Past medical history includes depression.     Pertinent negatives include no hypothyroidism. Gastroesophageal Reflux He reports no abdominal pain, no belching, no chest pain, no choking, no coughing, no dysphagia, no early satiety, no globus sensation, no heartburn, no hoarse voice, no nausea, no sore throat, no stridor, no tooth decay, no water brash or no wheezing. neuropathy. This is a chronic problem. The current episode started 1 to 4 weeks ago. The problem occurs occasionally. The problem has been gradually improving. Associated symptoms include fatigue. Pertinent negatives include no melena or weight loss. He has tried a PPI for the symptoms. The treatment provided moderate relief.  Neurologic Problem The patient's pertinent negatives include no focal sensory loss or focal weakness. Primary symptoms comment: neuropathy. This is a recurrent problem. The current episode started in the past 7 days. The neurological problem developed gradually. The problem has been gradually improving since onset. Associated symptoms include fatigue. Pertinent negatives include no abdominal pain, back pain, chest pain, confusion, dizziness, fever, headaches, light-headedness, nausea, neck pain, palpitations or shortness of breath. There is no history of liver disease.    Review of Systems  Constitutional: Positive for fatigue. Negative for appetite change, chills, fever, malaise/fatigue and weight loss.  HENT: Negative for drooling, ear discharge, ear pain, hoarse voice and sore throat.   Respiratory: Negative for cough, choking, shortness of breath and wheezing.   Cardiovascular: Negative for chest pain, palpitations and leg swelling.    Gastrointestinal: Negative for abdominal pain, anorexia, blood in stool, constipation, diarrhea, dysphagia, heartburn, hematemesis, hematochezia, melena and nausea.  Endocrine: Negative for polydipsia.  Genitourinary:  Negative for dysuria, frequency, hematuria, menorrhagia, urgency and vaginal bleeding.  Musculoskeletal: Negative for back pain, myalgias and neck pain.  Skin: Negative for pallor and rash.  Allergic/Immunologic: Negative for environmental allergies.  Neurological: Negative for dizziness, focal weakness, light-headedness, headaches and paresthesias.  Hematological: Does not bruise/bleed easily.  Psychiatric/Behavioral: Positive for depression. Negative for confusion, decreased concentration and suicidal ideas. The patient is not nervous/anxious and does not have insomnia.     Patient Active Problem List   Diagnosis Date Noted   Therapeutic drug monitoring 03/23/2018   Mild episode of recurrent major depressive disorder ([place]) 09/26/2017   Ventricular ectopic beats 09/26/2017   Type 2 diabetes mellitus with diabetic polyneuropathy, with long-term current use of insulin ([place]) 08/12/2017   Hyperlipidemia due to type 2 diabetes mellitus ([place]) 08/12/2017   B12 deficiency 12/14/2016   Low back pain 03/10/2016   Lumbosacral disc disease 01/01/2016   Neuropathy associated with endocrine disorder ([place]) 11/19/2015   B12 deficiency anemia 06/03/2015   Essential hypertension 06/03/2015   Hyperlipidemia 06/03/2015   Depression 06/03/2015   Gastroesophageal reflux disease without esophagitis 06/03/2015   Edema extremities 06/03/2015   Multiple sclerosis ([place]) 07/26/2013   Abnormality of gait 07/26/2013   Morbid obesity ([place]) 07/26/2013    Allergies  Allergen Reactions   Betadine [Povidone Iodine]     Past Surgical History:  Procedure Laterality Date   COLECTOMY  06-2008   COLONOSCOPY  2015   normal    Social History   Tobacco Use   Smoking status:  Former Smoker    Packs/day: 1.50    Years: 30.00    Pack years: 45.00    Types: Cigarettes    Quit date: 01/23/2006    Years since quitting: 13.4   Smokeless tobacco: Never Used   Tobacco comment: N/A  Substance Use Topics   Alcohol use: Yes    Alcohol/week: 0.0 standard drinks    Comment: rare; maybe 2 beers a year   Drug use: No     Medication list has been reviewed and updated.  Current Meds  Medication Sig   aspirin 81 MG tablet Take 81 mg by mouth daily.   cyanocobalamin (,VITAMIN B-12,) 1000 MCG/ML injection Inject 1 mL (1,000 mcg total) into the muscle every 14 (fourteen) days.   docusate sodium (COLACE) 50 MG capsule Take 50 mg by mouth daily. otc    ferrous sulfate (FEROSUL) 325 (65 FE) MG tablet TAKE 1 TABLET(325 MG) BY MOUTH THREE TIMES DAILY   furosemide (LASIX) 40 MG tablet TAKE 1 TABLET(40 MG) by mouth once a day   gemfibrozil (LOPID) 600 MG tablet Take 1 tablet (600 mg total) by mouth 2 (two) times daily before a meal.   Insulin Glargine (LANTUS) 100 UNIT/ML Solostar Pen Inject 36 Units into the skin daily. Dr Honor Junes   Interferon Beta-1b (BETASERON) 0.3 MG KIT injection Inject subcutaneously 1 syringe every other day.   loratadine (CLARITIN) 10 MG tablet Take 1 tablet (10 mg total) by mouth daily.   metFORMIN (GLUCOPHAGE) 500 MG tablet Take 500 mg by mouth daily with breakfast. Dr Honor Junes   mupirocin ointment (BACTROBAN) 2 % APPLY EXTERNALLY TO THE AFFECTED AREA TWICE DAILY   omeprazole (PRILOSEC) 40 MG capsule One a day   pregabalin (LYRICA) 200 MG capsule Take 1 capsule (200 mg total) by mouth 2 (two) times daily.   sertraline (ZOLOFT) 50 MG tablet TAKE 1/2 TABLET(25 MG) BY MOUTH DAILY   simvastatin (ZOCOR) 40 MG tablet Take 1 tablet (40  mg total) by mouth daily.   traMADol (ULTRAM) 50 MG tablet Take 1 tablet (50 mg total) by mouth every 6 (six) hours as needed. for pain    PHQ 2/9 Scores 07/16/2019 10/04/2018 07/06/2018 07/06/2018  PHQ - 2  Score 0 0 0 0  PHQ- 9 Score 0 6 4 -    BP Readings from Last 3 Encounters:  07/16/19 140/62  05/01/19 128/70  01/09/19 130/64    Physical Exam Vitals signs and nursing note reviewed.  Constitutional:      General: He is not irritable. HENT:     Head: Normocephalic.     Right Ear: Tympanic membrane, ear canal and external ear normal.     Left Ear: Tympanic membrane, ear canal and external ear normal.     Nose: Nose normal.  Eyes:     General: No scleral icterus.       Right eye: No discharge.        Left eye: No discharge.     Conjunctiva/sclera: Conjunctivae normal.     Pupils: Pupils are equal, round, and reactive to light.  Neck:     Musculoskeletal: Normal range of motion and neck supple.     Thyroid: No thyromegaly.     Vascular: No JVD.     Trachea: No tracheal deviation.  Cardiovascular:     Rate and Rhythm: Normal rate and regular rhythm.     Chest Wall: PMI is not displaced.     Pulses: Normal pulses.          Carotid pulses are 2+ on the right side and 2+ on the left side.      Radial pulses are 2+ on the right side and 2+ on the left side.       Femoral pulses are 2+ on the right side and 2+ on the left side.      Popliteal pulses are 2+ on the right side and 2+ on the left side.       Dorsalis pedis pulses are 2+ on the right side and 2+ on the left side.       Posterior tibial pulses are 2+ on the right side and 2+ on the left side.     Heart sounds: Normal heart sounds, S1 normal and S2 normal. Heart sounds not distant. No murmur. No systolic murmur. No diastolic murmur. No friction rub. No gallop. No S3 or S4 sounds.   Pulmonary:     Effort: No respiratory distress.     Breath sounds: Normal breath sounds. No wheezing or rales.  Abdominal:     General: Bowel sounds are normal.     Palpations: Abdomen is soft. There is no mass.     Tenderness: There is no abdominal tenderness. There is no guarding or rebound.  Musculoskeletal: Normal range of motion.         General: No tenderness.     Right lower leg: 1+ Pitting Edema present.     Left lower leg: 1+ Pitting Edema present.  Lymphadenopathy:     Cervical: No cervical adenopathy.  Skin:    General: Skin is warm.     Findings: No rash.  Neurological:     Mental Status: He is alert and oriented to person, place, and time.     Cranial Nerves: No cranial nerve deficit.     Deep Tendon Reflexes: Reflexes are normal and symmetric.     Wt Readings from Last 3 Encounters:  07/16/19 232 lb (105.2 kg)  05/01/19 233 lb (105.7 kg)  01/09/19 234 lb (106.1 kg)    BP 140/62    Pulse 80    Resp 16    Ht 5' 10"  (1.778 m)    Wt 232 lb (105.2 kg)    BMI 33.29 kg/m   Assessment and Plan:  1. Anemia, unspecified type Patient with history of anemia.  We will continue ferrous sulfate 325 3 times a day. - ferrous sulfate (FEROSUL) 325 (65 FE) MG tablet; TAKE 1 TABLET(325 MG) BY MOUTH THREE TIMES DAILY  Dispense: 270 tablet; Refill: 1  2. Gastroesophageal reflux disease without esophagitis Chronic.  Controlled.  Continue Prilosec 40 mg - omeprazole (PRILOSEC) 40 MG capsule; One a day  Dispense: 90 capsule; Refill: 1  3. Generalized edema Patient has generalized edema which is relatively controlled with Lasix 40 mg once a day. - furosemide (LASIX) 40 MG tablet; TAKE 1 TABLET(40 MG) by mouth once a day  Dispense: 180 tablet; Refill: 1  4. Neuropathy associated with endocrine disorder [facility]) Patient is on pre-gabapentin 200 mg 1 capsule twice a day for neuropathy due to MS disease. - pregabalin (LYRICA) 200 MG capsule; Take 1 capsule (200 mg total) by mouth 2 (two) times daily.  Dispense: 60 capsule; Refill: 5  5. Idiopathic peripheral neuropathy As noted idiopathic peripheral neuritis may be secondary to diabetic will continue Lyrica 200 mg twice a day. - pregabalin (LYRICA) 200 MG capsule; Take 1 capsule (200 mg total) by mouth 2 (two) times daily.  Dispense: 60 capsule; Refill: 5  6. Mixed  hyperlipidemia Patient has a history of mixed hyperlipidemia we will continue gemfibrozil 600 mg twice a day and simvastatin 40 mg once a day we will check a lipid panel. - simvastatin (ZOCOR) 40 MG tablet; Take 1 tablet (40 mg total) by mouth daily.  Dispense: 90 tablet; Refill: 1 - gemfibrozil (LOPID) 600 MG tablet; Take 1 tablet (600 mg total) by mouth 2 (two) times daily before a meal.  Dispense: 180 tablet; Refill: 1 - Lipid Panel With LDL/HDL Ratio  7. Moderate episode of recurrent major depressive disorder (HCC) Patient's PHQ score and gad score were both 0.  Continue sertraline 25 mg which is a half of a 50 mg tablet. - sertraline (ZOLOFT) 50 MG tablet; TAKE 1/2 TABLET(25 MG) BY MOUTH DAILY  Dispense: 45 tablet; Refill: 1  8. Taking medication for chronic disease Patient taking medication which may affect liver function in particular the statin agent and we will check a hepatic function panel. - Hepatic Function Panel (6)  9. Influenza vaccine needed Controlled and administered. - Flu Vaccine QUAD High Dose(Fluad)

## 2019-07-17 LAB — HEPATIC FUNCTION PANEL (6)
ALT: 15 IU/L (ref 0–44)
AST: 20 IU/L (ref 0–40)
Albumin: 4.4 g/dL (ref 3.8–4.8)
Alkaline Phosphatase: 77 IU/L (ref 39–117)
Bilirubin Total: 0.3 mg/dL (ref 0.0–1.2)
Bilirubin, Direct: 0.09 mg/dL (ref 0.00–0.40)

## 2019-07-17 LAB — LIPID PANEL WITH LDL/HDL RATIO
Cholesterol, Total: 129 mg/dL (ref 100–199)
HDL: 31 mg/dL — ABNORMAL LOW (ref >39)
LDL Chol Calc (NIH): 73 mg/dL (ref 0–99)
LDL/HDL Ratio: 2.4 ratio (ref 0.0–3.6)
Triglycerides: 139 mg/dL (ref 0–149)
VLDL Cholesterol Cal: 25 mg/dL (ref 5–40)

## 2019-08-29 ENCOUNTER — Other Ambulatory Visit: Payer: Self-pay

## 2019-08-29 DIAGNOSIS — D518 Other vitamin B12 deficiency anemias: Secondary | ICD-10-CM

## 2019-08-29 MED ORDER — CYANOCOBALAMIN 1000 MCG/ML IJ SOLN: 1000.0000 ug | INTRAMUSCULAR | 2 refills | Status: DC

## 2019-08-29 NOTE — Progress Notes (Unsigned)
Sent in B12

## 2019-09-14 DIAGNOSIS — E1142 Type 2 diabetes mellitus with diabetic polyneuropathy: Secondary | ICD-10-CM | POA: Diagnosis not present

## 2019-09-14 DIAGNOSIS — Z794 Long term (current) use of insulin: Secondary | ICD-10-CM | POA: Diagnosis not present

## 2019-09-14 DIAGNOSIS — E1169 Type 2 diabetes mellitus with other specified complication: Secondary | ICD-10-CM | POA: Diagnosis not present

## 2019-09-14 DIAGNOSIS — E785 Hyperlipidemia, unspecified: Secondary | ICD-10-CM | POA: Diagnosis not present

## 2019-09-14 LAB — HEMOGLOBIN A1C: Hemoglobin A1C: 5.5

## 2019-10-05 DIAGNOSIS — H40003 Preglaucoma, unspecified, bilateral: Secondary | ICD-10-CM | POA: Diagnosis not present

## 2019-10-05 DIAGNOSIS — E119 Type 2 diabetes mellitus without complications: Secondary | ICD-10-CM | POA: Diagnosis not present

## 2019-10-08 ENCOUNTER — Ambulatory Visit: Payer: Medicare Other

## 2019-10-10 ENCOUNTER — Telehealth: Payer: Self-pay | Admitting: Neurology

## 2019-10-10 NOTE — Telephone Encounter (Signed)
Patient called with wife to advise that they dropped off paperwork for the patients Interferon Beta-1b (BETASERON) 0.3 MG KIT injection in order to get medication assistance and states paperwork needs to be received before the end of the year in order for them to get the assistance.  They would like to ensure it has been received and processed. Please follow up.

## 2019-10-11 NOTE — Telephone Encounter (Signed)
I was able to speak to the patient.  I let him know his Betaseron patient assistance form has been completed, signed by Dr. Krista Blue and faxed with confirmation of receipt.

## 2019-10-20 ENCOUNTER — Other Ambulatory Visit: Payer: Self-pay | Admitting: Family Medicine

## 2019-10-20 DIAGNOSIS — G609 Hereditary and idiopathic neuropathy, unspecified: Secondary | ICD-10-CM

## 2019-10-20 DIAGNOSIS — E349 Endocrine disorder, unspecified: Secondary | ICD-10-CM

## 2019-10-22 ENCOUNTER — Ambulatory Visit: Payer: Medicare Other

## 2019-10-28 DIAGNOSIS — I270 Primary pulmonary hypertension: Secondary | ICD-10-CM | POA: Insufficient documentation

## 2019-11-05 ENCOUNTER — Ambulatory Visit (INDEPENDENT_AMBULATORY_CARE_PROVIDER_SITE_OTHER): Payer: Medicare Other

## 2019-11-05 VITALS — Ht 70.0 in | Wt 227.0 lb

## 2019-11-05 DIAGNOSIS — Z0000 Encounter for general adult medical examination without abnormal findings: Secondary | ICD-10-CM

## 2019-11-05 NOTE — Patient Instructions (Signed)
Jeremy [name] , Thank you for taking time to come for your Medicare Wellness Visit. I appreciate your ongoing commitment to your health goals. Please review the following plan we discussed and let me know if I can assist you in the future.   Screening recommendations/referrals: Colonoscopy: done 2015 Recommended yearly ophthalmology/optometry visit for glaucoma screening and checkup Recommended yearly dental visit for hygiene and checkup  Vaccinations: Influenza vaccine: done 07/16/19 Pneumococcal vaccine: done 01/09/19 Tdap vaccine: done 07/06/18 Shingles vaccine: Shingrix discussed. Please contact your pharmacy for coverage information.   Advanced directives: Advance directive discussed with you today. I have provided a copy for you to complete at home and have notarized. Once this is complete please bring a copy in to our office so we can scan it into your chart.  Conditions/risks identified: Recommend preventing falls  Next appointment: Please follow up in one year for your Medicare Annual Wellness visit.    Preventive Care 4 Years and Older, Male Preventive care refers to lifestyle choices and visits with your health care provider that can promote health and wellness. What does preventive care include?  A yearly physical exam. This is also called an annual well check.  Dental exams once or twice a year.  Routine eye exams. Ask your health care provider how often you should have your eyes checked.  Personal lifestyle choices, including:  Daily care of your teeth and gums.  Regular physical activity.  Eating a healthy diet.  Avoiding tobacco and drug use.  Limiting alcohol use.  Practicing safe sex.  Taking low doses of aspirin every day.  Taking vitamin and mineral supplements as recommended by your health care provider. What happens during an annual well check? The services and screenings done by your health care provider during your annual well check will depend on  your age, overall health, lifestyle risk factors, and family history of disease. Counseling  Your health care provider may ask you questions about your:  Alcohol use.  Tobacco use.  Drug use.  Emotional well-being.  Home and relationship well-being.  Sexual activity.  Eating habits.  History of falls.  Memory and ability to understand (cognition).  Work and work Statistician. Screening  You may have the following tests or measurements:  Height, weight, and BMI.  Blood pressure.  Lipid and cholesterol levels. These may be checked every 5 years, or more frequently if you are over 57 years old.  Skin check.  Lung cancer screening. You may have this screening every year starting at age 81 if you have a 30-pack-year history of smoking and currently smoke or have quit within the past 15 years.  Fecal occult blood test (FOBT) of the stool. You may have this test every year starting at age 51.  Flexible sigmoidoscopy or colonoscopy. You may have a sigmoidoscopy every 5 years or a colonoscopy every 10 years starting at age 72.  Prostate cancer screening. Recommendations will vary depending on your family history and other risks.  Hepatitis C blood test.  Hepatitis B blood test.  Sexually transmitted disease (STD) testing.  Diabetes screening. This is done by checking your blood sugar (glucose) after you have not eaten for a while (fasting). You may have this done every 1-3 years.  Abdominal aortic aneurysm (AAA) screening. You may need this if you are a current or former smoker.  Osteoporosis. You may be screened starting at age 35 if you are at high risk. Talk with your health care provider about your test results, treatment  options, and if necessary, the need for more tests. Vaccines  Your health care provider may recommend certain vaccines, such as:  Influenza vaccine. This is recommended every year.  Tetanus, diphtheria, and acellular pertussis (Tdap, Td) vaccine.  You may need a Td booster every 10 years.  Zoster vaccine. You may need this after age 57.  Pneumococcal 13-valent conjugate (PCV13) vaccine. One dose is recommended after age 17.  Pneumococcal polysaccharide (PPSV23) vaccine. One dose is recommended after age 47. Talk to your health care provider about which screenings and vaccines you need and how often you need them. This information is not intended to replace advice given to you by your health care provider. Make sure you discuss any questions you have with your health care provider. Document Released: 11/07/2015 Document Revised: 06/30/2016 Document Reviewed: 08/12/2015 Elsevier Interactive Patient Education  2017 [place] Prevention in the Home Falls can cause injuries. They can happen to people of all ages. There are many things you can do to make your home safe and to help prevent falls. What can I do on the outside of my home?  Regularly fix the edges of walkways and driveways and fix any cracks.  Remove anything that might make you trip as you walk through a door, such as a raised step or threshold.  Trim any bushes or trees on the path to your home.  Use bright outdoor lighting.  Clear any walking paths of anything that might make someone trip, such as rocks or tools.  Regularly check to see if handrails are loose or broken. Make sure that both sides of any steps have handrails.  Any raised decks and porches should have guardrails on the edges.  Have any leaves, snow, or ice cleared regularly.  Use sand or salt on walking paths during winter.  Clean up any spills in your garage right away. This includes oil or grease spills. What can I do in the bathroom?  Use night lights.  Install grab bars by the toilet and in the tub and shower. Do not use towel bars as grab bars.  Use non-skid mats or decals in the tub or shower.  If you need to sit down in the shower, use a plastic, non-slip stool.  Keep the  floor dry. Clean up any water that spills on the floor as soon as it happens.  Remove soap buildup in the tub or shower regularly.  Attach bath mats securely with double-sided non-slip rug tape.  Do not have throw rugs and other things on the floor that can make you trip. What can I do in the bedroom?  Use night lights.  Make sure that you have a light by your bed that is easy to reach.  Do not use any sheets or blankets that are too big for your bed. They should not hang down onto the floor.  Have a firm chair that has side arms. You can use this for support while you get dressed.  Do not have throw rugs and other things on the floor that can make you trip. What can I do in the kitchen?  Clean up any spills right away.  Avoid walking on wet floors.  Keep items that you use a lot in easy-to-reach places.  If you need to reach something above you, use a strong step stool that has a grab bar.  Keep electrical cords out of the way.  Do not use floor polish or wax that makes floors slippery.  If you must use wax, use non-skid floor wax.  Do not have throw rugs and other things on the floor that can make you trip. What can I do with my stairs?  Do not leave any items on the stairs.  Make sure that there are handrails on both sides of the stairs and use them. Fix handrails that are broken or loose. Make sure that handrails are as long as the stairways.  Check any carpeting to make sure that it is firmly attached to the stairs. Fix any carpet that is loose or worn.  Avoid having throw rugs at the top or bottom of the stairs. If you do have throw rugs, attach them to the floor with carpet tape.  Make sure that you have a light switch at the top of the stairs and the bottom of the stairs. If you do not have them, ask someone to add them for you. What else can I do to help prevent falls?  Wear shoes that:  Do not have high heels.  Have rubber bottoms.  Are comfortable and fit  you well.  Are closed at the toe. Do not wear sandals.  If you use a stepladder:  Make sure that it is fully opened. Do not climb a closed stepladder.  Make sure that both sides of the stepladder are locked into place.  Ask someone to hold it for you, if possible.  Clearly mark and make sure that you can see:  Any grab bars or handrails.  First and last steps.  Where the edge of each step is.  Use tools that help you move around (mobility aids) if they are needed. These include:  Canes.  Walkers.  Scooters.  Crutches.  Turn on the lights when you go into a dark area. Replace any light bulbs as soon as they burn out.  Set up your furniture so you have a clear path. Avoid moving your furniture around.  If any of your floors are uneven, fix them.  If there are any pets around you, be aware of where they are.  Review your medicines with your doctor. Some medicines can make you feel dizzy. This can increase your chance of falling. Ask your doctor what other things that you can do to help prevent falls. This information is not intended to replace advice given to you by your health care provider. Make sure you discuss any questions you have with your health care provider. Document Released: 08/07/2009 Document Revised: 03/18/2016 Document Reviewed: 11/15/2014 Elsevier Interactive Patient Education  2017 [employer].

## 2019-11-05 NOTE — Progress Notes (Signed)
Subjective:   Jeremy [name] is a 69 y.o. male who presents for Medicare Annual/Subsequent preventive examination.  Virtual Visit via Telephone Note  I connected with Jeremy [name] on 11/05/19 at  8:40 AM EST by telephone and verified that I am speaking with the correct person using two identifiers.  Medicare Annual Wellness visit completed telephonically due to Covid-19 pandemic.   Location: Patient: home Provider: office   I discussed the limitations, risks, security and privacy concerns of performing an evaluation and management service by telephone and the availability of in person appointments. The patient expressed understanding and agreed to proceed.  Some vital signs may be absent or patient reported.   Jeremy Marker, LPN    Review of Systems:   Cardiac Risk Factors include: advanced age (>34mn, >>64women);diabetes mellitus;dyslipidemia;male gender;hypertension;obesity (BMI >30kg/m2)     Objective:    Vitals: Ht 5' 10"  (1.778 m)   Wt 227 lb (103 kg)   BMI 32.57 kg/m   Body mass index is 32.57 kg/m.  Advanced Directives 11/05/2019 10/04/2018 09/29/2017 09/11/2015  Does Patient Have a Medical Advance Directive? No No No Yes  Type of Advance Directive - - - Living will  Copy of [place]? - - - No - copy requested  Would patient like information on creating a medical advance directive? Yes (MAU/Ambulatory/Procedural Areas - Information given) Yes (MAU/Ambulatory/Procedural Areas - Information given) Yes (MAU/Ambulatory/Procedural Areas - Information given) -    Tobacco Social History   Tobacco Use  Smoking Status Former Smoker  . Packs/day: 1.50  . Years: 30.00  . Pack years: 45.00  . Types: Cigarettes  . Quit date: 01/23/2006  . Years since quitting: 13.7  Smokeless Tobacco Never Used  Tobacco Comment   N/A     Counseling given: Not Answered Comment: N/A   Clinical Intake:  Pre-visit preparation completed: Yes  Pain :  No/denies pain     BMI - recorded: 32.57 Nutritional Status: BMI > 30  Obese Nutritional Risks: None Diabetes: Yes CBG done?: No Did pt. bring in CBG monitor from home?: No   Nutrition Risk Assessment:  Has the patient had any N/V/D within the last 2 months?  No  Does the patient have any non-healing wounds?  No  Has the patient had any unintentional weight loss or weight gain?  No   Diabetes:  Is the patient diabetic?  Yes  If diabetic, was a CBG obtained today?  No  Did the patient bring in their glucometer from home?  No  How often do you monitor your CBG's? Twice daily.   Financial Strains and Diabetes Management:  Are you having any financial strains with the device, your supplies or your medication? No .  Does the patient want to be seen by Chronic Care Management for management of their diabetes?  No  Would the patient like to be referred to a Nutritionist or for Diabetic Management?  No   Diabetic Exams:  Diabetic Eye Exam: Completed per patient 09/2019, [name]   Diabetic Foot Exam: Completed 09/14/19 Dr. OHonor Junes    How often do you need to have someone help you when you read instructions, pamphlets, or other written materials from your doctor or pharmacy?: 1 - Never  Interpreter Needed?: No  Information entered by :: [name]  Past Medical History:  Diagnosis Date  . Chronic pain   . Depression   . [place]   .  GERD (gastroesophageal reflux disease)   . Hyperlipemia   . Hypertension   . MS (multiple sclerosis) ([place])    Past Surgical History:  Procedure Laterality Date  . COLECTOMY  06-2008  . COLONOSCOPY  2015   normal   Family History  Problem Relation Age of Onset  . Lung cancer Mother   . Heart attack Father   . Heart attack Brother   . COPD Brother    Social History   Socioeconomic History  . Marital status: Married    Spouse name: Jeremy [name]  . Number of children: 2  . Years of  education: GED  . Highest education level: Not on file  Occupational History    Comment: Disabled  Tobacco Use  . Smoking status: Former Smoker    Packs/day: 1.50    Years: 30.00    Pack years: 45.00    Types: Cigarettes    Quit date: 01/23/2006    Years since quitting: 13.7  . Smokeless tobacco: Never Used  . Tobacco comment: N/A  Substance and Sexual Activity  . Alcohol use: Yes    Alcohol/week: 0.0 standard drinks    Comment: rare; maybe 2 beers a year  . Drug use: No  . Sexual activity: Not Currently  Other Topics Concern  . Not on file  Social History Narrative   Patient is disabled.    Patient lives with his wife Jeremy [name].    Patient has 2 children.       Social Determinants of Health   Financial Resource Strain: Low Risk   . Difficulty of Paying Living Expenses: Not hard at all  Food Insecurity: No Food Insecurity  . Worried About Charity fundraiser in the Last Year: Never true  . Ran Out of Food in the Last Year: Never true  Transportation Needs: No Transportation Needs  . Lack of Transportation (Medical): No  . Lack of Transportation (Non-Medical): No  Physical Activity: Inactive  . Days of Exercise per Week: 0 days  . Minutes of Exercise per Session: 0 min  Stress: No Stress Concern Present  . Feeling of Stress : Not at all  Social Connections: Slightly Isolated  . Frequency of Communication with Friends and Family: More than three times a week  . Frequency of Social Gatherings with Friends and Family: Once a week  . Attends Religious Services: More than 4 times per year  . Active Member of Clubs or Organizations: No  . Attends Archivist Meetings: Never  . Marital Status: Married    Outpatient Encounter Medications as of 11/05/2019  Medication Sig  . aspirin 81 MG tablet Take 81 mg by mouth daily.  . B-D INSULIN SYRINGE 1CC/25GX1" 25G X 1" 1 ML MISC U UTD  . BD PEN NEEDLE NANO U/F 32G X 4 MM MISC   . cyanocobalamin (,VITAMIN B-12,) 1000  MCG/ML injection Inject 1 mL (1,000 mcg total) into the muscle every 14 (fourteen) days.  Marland Kitchen docusate sodium (COLACE) 50 MG capsule Take 50 mg by mouth daily. otc   . ferrous sulfate (FEROSUL) 325 (65 FE) MG tablet TAKE 1 TABLET(325 MG) BY MOUTH THREE TIMES DAILY  . furosemide (LASIX) 40 MG tablet TAKE 1 TABLET(40 MG) by mouth once a day  . gemfibrozil (LOPID) 600 MG tablet Take 1 tablet (600 mg total) by mouth 2 (two) times daily before a meal.  . Insulin Glargine (LANTUS) 100 UNIT/ML Solostar Pen Inject 36 Units into the skin daily. Dr Honor Junes  .  Interferon Beta-1b (BETASERON) 0.3 MG KIT injection Inject subcutaneously 1 syringe every other day.  . loratadine (CLARITIN) 10 MG tablet Take 1 tablet (10 mg total) by mouth daily.  . metFORMIN (GLUCOPHAGE) 500 MG tablet Take 1 tablet by mouth 2 (two) times daily.  . mupirocin ointment (BACTROBAN) 2 % APPLY EXTERNALLY TO THE AFFECTED AREA TWICE DAILY  . omeprazole (PRILOSEC) 40 MG capsule One a day  . ONETOUCH ULTRA test strip TEST TID  . pregabalin (LYRICA) 200 MG capsule TAKE ONE CAPSULE BY MOUTH TWICE DAILY  . sertraline (ZOLOFT) 50 MG tablet TAKE 1/2 TABLET(25 MG) BY MOUTH DAILY  . simvastatin (ZOCOR) 40 MG tablet Take 1 tablet (40 mg total) by mouth daily.  . traMADol (ULTRAM) 50 MG tablet Take 1 tablet (50 mg total) by mouth every 6 (six) hours as needed. for pain  . [DISCONTINUED] metFORMIN (GLUCOPHAGE) 500 MG tablet Take 500 mg by mouth daily with breakfast. Dr Honor Junes   No facility-administered encounter medications on file as of 11/05/2019.    Activities of Daily Living In your present state of health, do you have any difficulty performing the following activities: 11/05/2019  Hearing? N  Comment declines hearing aids  Vision? N  Difficulty concentrating or making decisions? N  Walking or climbing stairs? N  Dressing or bathing? N  Doing errands, shopping? N  Preparing Food and eating ? N  Using the Toilet? N  In the past six  months, have you accidently leaked urine? N  Do you have problems with loss of bowel control? N  Managing your Medications? N  Managing your Finances? N  Housekeeping or managing your Housekeeping? N  Some recent data might be hidden    Patient Care Team: Juline Patch, MD as PCP - General (Family Medicine) Marcial Pacas, MD as Consulting Physician (Neurology) Samara Deist, DPM as Consulting Physician (Podiatry) Lonia Farber, MD as Consulting Physician (Internal Medicine) Magnus Sinning, MD as Consulting Physician (Nephrology)   Assessment:   This is a routine wellness examination for Japheth.  Exercise Activities and Dietary recommendations Current Exercise Habits: The patient does not participate in regular exercise at present, Exercise limited by: orthopedic condition(s);neurologic condition(s)  Goals    . Prevent falls     Recommend to maintain adequate lighting in walkways, remove rugs that may cause slips or trips and continue to use cane for assistance with ambulation.       Fall Risk Fall Risk  11/05/2019 07/16/2019 10/04/2018 07/06/2018 03/23/2018  Falls in the past year? 1 1 1  Yes Yes  Number falls in past yr: 1 0 1 2 or more 2 or more  Injury with Fall? 0 0 0 No Yes  Comment - - mild skin abrasions - bruises, skin tears  Risk Factor Category  - - - High Fall Risk -  Comment - - - - -  Risk for fall due to : Impaired balance/gait;Impaired mobility;History of fall(s) - History of fall(s);Impaired balance/gait Impaired balance/gait;Impaired mobility Impaired balance/gait  Risk for fall due to: Comment - - - - -  Follow up Falls prevention discussed - Falls prevention discussed Falls evaluation completed -  Comment - - - - -   FALL RISK PREVENTION PERTAINING TO THE HOME:  Any stairs in or around the home? Yes  If so, do they handrails? Yes    Home free of loose throw rugs in walkways, pet beds, electrical cords, etc? Yes  Adequate lighting in your home  to reduce  risk of falls? Yes   ASSISTIVE DEVICES UTILIZED TO PREVENT FALLS:  Life alert? No  Use of a cane, walker or w/c? Yes  Grab bars in the bathroom? No  Shower chair or bench in shower? Yes  Elevated toilet seat or a handicapped toilet? Yes   DME ORDERS:  DME order needed?  No   TIMED UP AND GO:  Was the test performed? No . Telephonic visit.   Education: Fall risk prevention has been discussed.  Intervention(s) required? No   Depression Screen PHQ 2/9 Scores 11/05/2019 07/16/2019 10/04/2018 07/06/2018  PHQ - 2 Score 0 0 0 0  PHQ- 9 Score - 0 6 4    Cognitive Function     6CIT Screen 11/05/2019 10/04/2018 09/29/2017  What Year? 0 points 0 points 0 points  What month? 0 points 0 points 0 points  What time? 0 points 0 points 0 points  Count back from 20 0 points 0 points 0 points  Months in reverse 0 points 0 points 0 points  Repeat phrase 2 points 2 points 2 points  Total Score 2 2 2     Immunization History  Administered Date(s) Administered  . Fluad Quad(high Dose 65+) 07/16/2019  . Influenza, High Dose Seasonal PF 08/09/2017, 07/06/2018  . Influenza,inj,Quad PF,6+ Mos 07/28/2015, 08/06/2016  . Influenza-Unspecified 07/25/2014  . Pneumococcal Conjugate-13 09/29/2017  . Pneumococcal Polysaccharide-23 01/09/2019  . Tdap 07/06/2018    Qualifies for Shingles Vaccine? Yes . Due for Shingrix. Education has been provided regarding the importance of this vaccine. Pt has been advised to call insurance company to determine out of pocket expense. Advised may also receive vaccine at local pharmacy or Health Dept. Verbalized acceptance and understanding.  Tdap: Up to date  Flu Vaccine: Up to date  Pneumococcal Vaccine: Up to date    Screening Tests Health Maintenance  Topic Date Due  . COLONOSCOPY  05/04/2002  . OPHTHALMOLOGY EXAM  03/26/2019  . HEMOGLOBIN A1C  03/13/2020  . URINE MICROALBUMIN  09/04/2020  . FOOT EXAM  09/13/2020  . TETANUS/TDAP  07/06/2028    . INFLUENZA VACCINE  Completed  . Hepatitis C Screening  Completed  . PNA vac Low Risk Adult  Completed   Cancer Screenings:  Colorectal Screening: Completed 2015, previous documentation reported normal but report unavailable for recommendations.   Lung Cancer Screening: (Low Dose CT Chest recommended if Age 13-80 years, 30 pack-year currently smoking OR have quit w/in 15years.) does not qualify.   Additional Screening:  Hepatitis C Screening: does qualify; Completed 09/29/17  Vision Screening: Recommended annual ophthalmology exams for early detection of glaucoma and other disorders of the eye. Is the patient up to date with their annual eye exam?  Yes  Who is the provider or what is the name of the office in which the pt attends annual eye exams? [place] Screening: Recommended annual dental exams for proper oral hygiene  Community Resource Referral:  CRR required this visit?  No       Plan:    I have personally reviewed and addressed the Medicare Annual Wellness questionnaire and have noted the following in the patient's chart:  A. Medical and social history B. Use of alcohol, tobacco or illicit drugs  C. Current medications and supplements D. Functional ability and status E.  Nutritional status F.  Physical activity G. Advance directives H. List of other physicians I.  Hospitalizations, surgeries, and ER visits in previous 12 months J.  Vitals K. Screenings such  as hearing and vision if needed, cognitive and depression L. Referrals and appointments   In addition, I have reviewed and discussed with patient certain preventive protocols, quality metrics, and best practice recommendations. A written personalized care plan for preventive services as well as general preventive health recommendations were provided to patient.   Signed,  Jeremy Marker, LPN Nurse Health Advisor   Nurse Notes: Pt states BP has been elevated recently, he was seen by Dr.  Candiss Norse last week and states he has another upcoming appt regarding kidney function.

## 2019-11-06 ENCOUNTER — Other Ambulatory Visit: Payer: Self-pay

## 2019-11-07 ENCOUNTER — Other Ambulatory Visit: Payer: Self-pay | Admitting: *Deleted

## 2019-11-07 MED ORDER — TRAMADOL HCL 50 MG PO TABS: 50.0000 mg | ORAL_TABLET | Freq: Four times a day (QID) | ORAL | 5 refills | Status: DC | PRN

## 2019-11-19 DIAGNOSIS — N179 Acute kidney failure, unspecified: Secondary | ICD-10-CM | POA: Diagnosis not present

## 2019-11-19 DIAGNOSIS — D649 Anemia, unspecified: Secondary | ICD-10-CM | POA: Diagnosis not present

## 2019-11-26 ENCOUNTER — Other Ambulatory Visit: Payer: Self-pay

## 2019-11-26 DIAGNOSIS — I10 Essential (primary) hypertension: Secondary | ICD-10-CM | POA: Diagnosis not present

## 2019-11-26 DIAGNOSIS — D631 Anemia in chronic kidney disease: Secondary | ICD-10-CM | POA: Diagnosis not present

## 2019-11-26 DIAGNOSIS — D518 Other vitamin B12 deficiency anemias: Secondary | ICD-10-CM

## 2019-11-26 DIAGNOSIS — E1129 Type 2 diabetes mellitus with other diabetic kidney complication: Secondary | ICD-10-CM | POA: Diagnosis not present

## 2019-11-26 DIAGNOSIS — N182 Chronic kidney disease, stage 2 (mild): Secondary | ICD-10-CM | POA: Diagnosis not present

## 2019-11-26 DIAGNOSIS — N179 Acute kidney failure, unspecified: Secondary | ICD-10-CM | POA: Diagnosis not present

## 2019-11-26 MED ORDER — CYANOCOBALAMIN 1000 MCG/ML IJ SOLN: 1000.0000 ug | INTRAMUSCULAR | 2 refills | Status: DC

## 2019-12-02 DIAGNOSIS — Z23 Encounter for immunization: Secondary | ICD-10-CM | POA: Diagnosis not present

## 2019-12-05 DIAGNOSIS — B351 Tinea unguium: Secondary | ICD-10-CM | POA: Diagnosis not present

## 2019-12-05 DIAGNOSIS — E1142 Type 2 diabetes mellitus with diabetic polyneuropathy: Secondary | ICD-10-CM | POA: Diagnosis not present

## 2019-12-06 ENCOUNTER — Other Ambulatory Visit: Payer: Self-pay

## 2019-12-06 DIAGNOSIS — K219 Gastro-esophageal reflux disease without esophagitis: Secondary | ICD-10-CM

## 2019-12-06 MED ORDER — OMEPRAZOLE 40 MG PO CPDR: DELAYED_RELEASE_CAPSULE | ORAL | 0 refills | Status: DC

## 2019-12-17 DIAGNOSIS — N182 Chronic kidney disease, stage 2 (mild): Secondary | ICD-10-CM | POA: Diagnosis not present

## 2020-01-02 DIAGNOSIS — Z23 Encounter for immunization: Secondary | ICD-10-CM | POA: Diagnosis not present

## 2020-01-14 ENCOUNTER — Other Ambulatory Visit: Payer: Self-pay

## 2020-01-14 NOTE — Progress Notes (Unsigned)
Put in A1C

## 2020-01-15 ENCOUNTER — Other Ambulatory Visit: Payer: Self-pay

## 2020-01-15 ENCOUNTER — Encounter: Payer: Self-pay | Admitting: Family Medicine

## 2020-01-15 ENCOUNTER — Ambulatory Visit (INDEPENDENT_AMBULATORY_CARE_PROVIDER_SITE_OTHER): Payer: Medicare Other | Admitting: Family Medicine

## 2020-01-15 VITALS — BP 120/60 | HR 80 | Ht 70.0 in | Wt 225.0 lb

## 2020-01-15 DIAGNOSIS — J0100 Acute maxillary sinusitis, unspecified: Secondary | ICD-10-CM | POA: Diagnosis not present

## 2020-01-15 DIAGNOSIS — I10 Essential (primary) hypertension: Secondary | ICD-10-CM

## 2020-01-15 DIAGNOSIS — E349 Endocrine disorder, unspecified: Secondary | ICD-10-CM | POA: Diagnosis not present

## 2020-01-15 DIAGNOSIS — F331 Major depressive disorder, recurrent, moderate: Secondary | ICD-10-CM

## 2020-01-15 DIAGNOSIS — D649 Anemia, unspecified: Secondary | ICD-10-CM | POA: Diagnosis not present

## 2020-01-15 DIAGNOSIS — G63 Polyneuropathy in diseases classified elsewhere: Secondary | ICD-10-CM | POA: Diagnosis not present

## 2020-01-15 DIAGNOSIS — K219 Gastro-esophageal reflux disease without esophagitis: Secondary | ICD-10-CM

## 2020-01-15 DIAGNOSIS — R601 Generalized edema: Secondary | ICD-10-CM | POA: Insufficient documentation

## 2020-01-15 DIAGNOSIS — E782 Mixed hyperlipidemia: Secondary | ICD-10-CM | POA: Diagnosis not present

## 2020-01-15 DIAGNOSIS — G609 Hereditary and idiopathic neuropathy, unspecified: Secondary | ICD-10-CM | POA: Diagnosis not present

## 2020-01-15 DIAGNOSIS — D518 Other vitamin B12 deficiency anemias: Secondary | ICD-10-CM | POA: Diagnosis not present

## 2020-01-15 MED ORDER — CYANOCOBALAMIN 1000 MCG/ML IJ SOLN: 1000.0000 ug | INTRAMUSCULAR | 2 refills | Status: DC

## 2020-01-15 MED ORDER — LORATADINE 10 MG PO TABS: 10.0000 mg | ORAL_TABLET | Freq: Every day | ORAL | 1 refills | Status: DC

## 2020-01-15 MED ORDER — GEMFIBROZIL 600 MG PO TABS: 600.0000 mg | ORAL_TABLET | Freq: Two times a day (BID) | ORAL | 1 refills | Status: DC

## 2020-01-15 MED ORDER — SIMVASTATIN 40 MG PO TABS: 40.0000 mg | ORAL_TABLET | Freq: Every day | ORAL | 1 refills | Status: DC

## 2020-01-15 MED ORDER — OMEPRAZOLE 40 MG PO CPDR: DELAYED_RELEASE_CAPSULE | ORAL | 1 refills | Status: DC

## 2020-01-15 MED ORDER — AMOXICILLIN 500 MG PO CAPS: 500.0000 mg | ORAL_CAPSULE | Freq: Three times a day (TID) | ORAL | 0 refills | Status: DC

## 2020-01-15 MED ORDER — SERTRALINE HCL 50 MG PO TABS: ORAL_TABLET | ORAL | 1 refills | Status: DC

## 2020-01-15 MED ORDER — FUROSEMIDE 40 MG PO TABS: ORAL_TABLET | ORAL | 1 refills | Status: DC

## 2020-01-15 MED ORDER — PREGABALIN 200 MG PO CAPS: 200.0000 mg | ORAL_CAPSULE | Freq: Two times a day (BID) | ORAL | 5 refills | Status: DC

## 2020-01-15 MED ORDER — FERROUS SULFATE 325 (65 FE) MG PO TABS: ORAL_TABLET | ORAL | 1 refills | Status: DC

## 2020-01-15 NOTE — Progress Notes (Signed)
Date:  01/15/2020   Name:  Jeremy [name]   DOB:  January 29, 1951   MRN:  [id number]   Chief Complaint: Hyperlipidemia, Anemia, Edema, b12 deficiency, Depression, Gastroesophageal Reflux, Allergic Rhinitis , Peripheral Neuropathy, and Sinusitis (sinus drainage)  Hyperlipidemia This is a chronic problem. The current episode started more than 1 year ago. The problem is controlled. Recent lipid tests were reviewed and are normal. Exacerbating diseases include obesity. He has no history of chronic renal disease, diabetes, hypothyroidism, liver disease or nephrotic syndrome. There are no known factors aggravating his hyperlipidemia. Pertinent negatives include no chest pain, focal sensory loss, focal weakness, leg pain, myalgias or shortness of breath. Current antihyperlipidemic treatment includes statins, fibric acid derivatives and diet change. The current treatment provides moderate improvement of lipids. Risk factors for coronary artery disease include hypertension, dyslipidemia and male sex.  Anemia Presents for follow-up visit. There has been no abdominal pain, anorexia, bruising/bleeding easily, confusion, fever, leg swelling, light-headedness, malaise/fatigue, pallor, palpitations, paresthesias, pica or weight loss. Signs of blood loss that are not present include hematochezia and melena. There is no history of chronic renal disease or hypothyroidism. There are no compliance problems.   Depression        This is a chronic problem.  The current episode started more than 1 year ago.   The problem occurs intermittently.  The problem has been gradually improving since onset.  Associated symptoms include no decreased concentration, no fatigue, no helplessness, no hopelessness, does not have insomnia, not irritable, no restlessness, no decreased interest, no appetite change, no body aches, no myalgias, no headaches, no indigestion, not sad and no suicidal ideas.     The symptoms are aggravated by medication.   Past treatments include SSRIs - Selective serotonin reuptake inhibitors.  Compliance with treatment is good.  Previous treatment provided moderate relief.   Pertinent negatives include no hypothyroidism. Gastroesophageal Reflux He reports no abdominal pain, no belching, no chest pain, no choking, no coughing, no dysphagia, no early satiety, no globus sensation, no heartburn, no hoarse voice, no nausea, no sore throat, no stridor, no tooth decay, no water brash or no wheezing. This is a chronic problem. The current episode started more than 1 year ago. The problem has been gradually improving. The symptoms are aggravated by certain foods. Pertinent negatives include no anemia, fatigue, melena, muscle weakness, orthopnea or weight loss. There are no known risk factors. He has tried a PPI for the symptoms. The treatment provided moderate relief.  Sinusitis This is a new problem. The current episode started in the past 7 days. There has been no fever. The pain is mild. Pertinent negatives include no chills, congestion, coughing, diaphoresis, ear pain, headaches, hoarse voice, neck pain, shortness of breath, sinus pressure, sneezing, sore throat or swollen glands. The treatment provided moderate relief.  Neurologic Problem The patient's pertinent negatives include no altered mental status, clumsiness, focal sensory loss, focal weakness, loss of balance, memory loss, near-syncope, syncope, visual change or weakness. This is a chronic problem. The current episode started more than 1 year ago. The problem has been gradually worsening since onset. There was no focality noted. Pertinent negatives include no abdominal pain, auditory change, aura, back pain, bladder incontinence, bowel incontinence, chest pain, confusion, diaphoresis, dizziness, fatigue, fever, headaches, light-headedness, nausea, neck pain, palpitations, shortness of breath, vertigo or vomiting. The treatment provided moderate relief. There is no history  of liver disease.    Lab Results  Component Value Date   CREATININE 1.29 (H)  03/23/2018   BUN 22 03/23/2018   NA 142 03/23/2018   K 4.4 03/23/2018   CL 102 03/23/2018   CO2 23 03/23/2018   Lab Results  Component Value Date   CHOL 129 07/16/2019   HDL 31 (L) 07/16/2019   LDLCALC 73 07/16/2019   TRIG 139 07/16/2019   CHOLHDL 6.1 (H) 08/06/2016   Lab Results  Component Value Date   TSH 1.600 07/30/2014   Lab Results  Component Value Date   HGBA1C 5.5 09/14/2019   Lab Results  Component Value Date   WBC 4.3 07/06/2018   HGB 12.4 (L) 07/06/2018   HCT 36.6 (L) 07/06/2018   MCV 94 07/06/2018   PLT 188 07/06/2018   Lab Results  Component Value Date   ALT 15 07/16/2019   AST 20 07/16/2019   ALKPHOS 77 07/16/2019   BILITOT 0.3 07/16/2019     Review of Systems  Constitutional: Negative for appetite change, chills, diaphoresis, fatigue, fever, malaise/fatigue and weight loss.  HENT: Negative for congestion, drooling, ear discharge, ear pain, hoarse voice, postnasal drip, rhinorrhea, sinus pressure, sneezing and sore throat.   Respiratory: Negative for cough, choking, shortness of breath and wheezing.   Cardiovascular: Negative for chest pain, palpitations, leg swelling and near-syncope.  Gastrointestinal: Negative for abdominal pain, anorexia, blood in stool, bowel incontinence, constipation, diarrhea, dysphagia, heartburn, hematochezia, melena, nausea and vomiting.  Endocrine: Negative for polydipsia.  Genitourinary: Negative for bladder incontinence, dysuria, frequency, hematuria and urgency.  Musculoskeletal: Negative for back pain, myalgias, muscle weakness and neck pain.  Skin: Negative for pallor and rash.  Allergic/Immunologic: Negative for environmental allergies.  Neurological: Negative for dizziness, vertigo, focal weakness, syncope, weakness, light-headedness, headaches, paresthesias and loss of balance.  Hematological: Does not bruise/bleed easily.    Psychiatric/Behavioral: Positive for depression. Negative for confusion, decreased concentration, memory loss and suicidal ideas. The patient is not nervous/anxious and does not have insomnia.     Patient Active Problem List   Diagnosis Date Noted  . Primary pulmonary hypertension ([place]) 10/28/2019  . Therapeutic drug monitoring 03/23/2018  . Mild episode of recurrent major depressive disorder ([place]) 09/26/2017  . Ventricular ectopic beats 09/26/2017  . Type 2 diabetes mellitus with diabetic polyneuropathy, with long-term current use of insulin ([place]) 08/12/2017  . Hyperlipidemia due to type 2 diabetes mellitus ([place]) 08/12/2017  . B12 deficiency 12/14/2016  . Low back pain 03/10/2016  . Lumbosacral disc disease 01/01/2016  . Neuropathy associated with endocrine disorder ([place]) 11/19/2015  . B12 deficiency anemia 06/03/2015  . Essential hypertension 06/03/2015  . Hyperlipidemia 06/03/2015  . Depression 06/03/2015  . Gastroesophageal reflux disease without esophagitis 06/03/2015  . Edema extremities 06/03/2015  . Multiple sclerosis ([place]) 07/26/2013  . Abnormality of gait 07/26/2013  . Morbid obesity ([place]) 07/26/2013    Allergies  Allergen Reactions  . Betadine [Povidone Iodine]     Past Surgical History:  Procedure Laterality Date  . COLECTOMY  06-2008  . COLONOSCOPY  2015   normal    Social History   Tobacco Use  . Smoking status: Former Smoker    Packs/day: 1.50    Years: 30.00    Pack years: 45.00    Types: Cigarettes    Quit date: 01/23/2006    Years since quitting: 13.9  . Smokeless tobacco: Never Used  . Tobacco comment: N/A  Substance Use Topics  . Alcohol use: Yes    Alcohol/week: 0.0 standard drinks    Comment: rare; maybe 2 beers a year  . Drug  use: No     Medication list has been reviewed and updated.  Current Meds  Medication Sig  . aspirin 81 MG tablet Take 81 mg by mouth daily.  . B-D INSULIN SYRINGE 1CC/25GX1" 25G X 1" 1 ML MISC U UTD  . BD PEN  NEEDLE NANO U/F 32G X 4 MM MISC   . cyanocobalamin (,VITAMIN B-12,) 1000 MCG/ML injection Inject 1 mL (1,000 mcg total) into the muscle every 14 (fourteen) days.  Marland Kitchen docusate sodium (COLACE) 50 MG capsule Take 50 mg by mouth daily. otc   . ferrous sulfate (FEROSUL) 325 (65 FE) MG tablet TAKE 1 TABLET(325 MG) BY MOUTH THREE TIMES DAILY  . furosemide (LASIX) 40 MG tablet TAKE 1 TABLET(40 MG) by mouth once a day  . gemfibrozil (LOPID) 600 MG tablet Take 1 tablet (600 mg total) by mouth 2 (two) times daily before a meal.  . Insulin Glargine (LANTUS) 100 UNIT/ML Solostar Pen Inject 36 Units into the skin daily. Dr Honor Junes  . Interferon Beta-1b (BETASERON) 0.3 MG KIT injection Inject subcutaneously 1 syringe every other day.  . loratadine (CLARITIN) 10 MG tablet Take 1 tablet (10 mg total) by mouth daily.  Marland Kitchen losartan (COZAAR) 25 MG tablet Take 1 tablet by mouth daily. singh  . metFORMIN (GLUCOPHAGE) 500 MG tablet Take 1 tablet by mouth 2 (two) times daily.  . mupirocin ointment (BACTROBAN) 2 % APPLY EXTERNALLY TO THE AFFECTED AREA TWICE DAILY  . omeprazole (PRILOSEC) 40 MG capsule One a day  . ONETOUCH ULTRA test strip TEST TID  . pregabalin (LYRICA) 200 MG capsule TAKE ONE CAPSULE BY MOUTH TWICE DAILY  . sertraline (ZOLOFT) 50 MG tablet TAKE 1/2 TABLET(25 MG) BY MOUTH DAILY  . simvastatin (ZOCOR) 40 MG tablet Take 1 tablet (40 mg total) by mouth daily.  . traMADol (ULTRAM) 50 MG tablet Take 1 tablet (50 mg total) by mouth every 6 (six) hours as needed. for pain    PHQ 2/9 Scores 01/15/2020 11/05/2019 07/16/2019 10/04/2018  PHQ - 2 Score 0 0 0 0  PHQ- 9 Score 0 - 0 6    BP Readings from Last 3 Encounters:  01/15/20 120/60  07/16/19 140/62  05/01/19 128/70    Physical Exam Vitals and nursing note reviewed.  Constitutional:      General: He is not irritable. HENT:     Head: Normocephalic.     Right Ear: Tympanic membrane, ear canal and external ear normal.     Left Ear: Tympanic membrane,  ear canal and external ear normal.     Nose: Nose normal. No congestion or rhinorrhea.     Mouth/Throat:     Mouth: Mucous membranes are moist.     Pharynx: Oropharynx is clear.  Eyes:     General: No scleral icterus.       Right eye: No discharge.        Left eye: No discharge.     Conjunctiva/sclera: Conjunctivae normal.     Pupils: Pupils are equal, round, and reactive to light.  Neck:     Thyroid: No thyromegaly.     Vascular: No JVD.     Trachea: No tracheal deviation.  Cardiovascular:     Rate and Rhythm: Normal rate and regular rhythm.     Heart sounds: Normal heart sounds. No murmur. No friction rub. No gallop.   Pulmonary:     Effort: No respiratory distress.     Breath sounds: Normal breath sounds. No wheezing, rhonchi or rales.  Chest:     Chest wall: No tenderness.  Abdominal:     General: Bowel sounds are normal.     Palpations: Abdomen is soft. There is no mass.     Tenderness: There is no abdominal tenderness. There is no guarding or rebound.  Musculoskeletal:        General: No tenderness. Normal range of motion.     Cervical back: Normal range of motion and neck supple.  Lymphadenopathy:     Cervical: No cervical adenopathy.  Skin:    General: Skin is warm.     Findings: No erythema or rash.  Neurological:     Mental Status: He is alert and oriented to person, place, and time.     Cranial Nerves: No cranial nerve deficit.     Deep Tendon Reflexes: Reflexes are normal and symmetric.     Wt Readings from Last 3 Encounters:  01/15/20 225 lb (102.1 kg)  11/05/19 227 lb (103 kg)  07/16/19 232 lb (105.2 kg)    BP 120/60   Pulse 80   Ht 5' 10"  (1.778 m)   Wt 225 lb (102.1 kg)   BMI 32.28 kg/m   Assessment and Plan: 1. Other vitamin B12 deficiency anemia Chronic.  Controlled.  Stable.  Continue B12 injections every 2 weeks. - cyanocobalamin (,VITAMIN B-12,) 1000 MCG/ML injection; Inject 1 mL (1,000 mcg total) into the muscle every 14 (fourteen) days.   Dispense: 2 mL; Refill: 2  2. Anemia, unspecified type Chronic.  Controlled.  Stable.  Continue ferrous sulfate 325 mg 3 times a day.  Will check CBC with differential for evaluation - ferrous sulfate (FEROSUL) 325 (65 FE) MG tablet; TAKE 1 TABLET(325 MG) BY MOUTH THREE TIMES DAILY  Dispense: 270 tablet; Refill: 1 - CBC w/Diff/Platelet  3. Generalized edema Chronic.  Controlled.  Stable.  Continue furosemide 40 mg once a day. - furosemide (LASIX) 40 MG tablet; TAKE 1 TABLET(40 MG) by mouth once a day  Dispense: 180 tablet; Refill: 1  4. Mixed hyperlipidemia Chronic.  Controlled.  Stable.  Continue gemfibrozil 600 mg twice a day and simvastatin 40 mg once a day will check lipid panel. - gemfibrozil (LOPID) 600 MG tablet; Take 1 tablet (600 mg total) by mouth 2 (two) times daily before a meal.  Dispense: 180 tablet; Refill: 1 - simvastatin (ZOCOR) 40 MG tablet; Take 1 tablet (40 mg total) by mouth daily.  Dispense: 90 tablet; Refill: 1 - Lipid Panel With LDL/HDL Ratio  5. Gastroesophageal reflux disease without esophagitis Chronic.  Controlled.  Stable.  Continue omeprazole 40 mg once a day. - omeprazole (PRILOSEC) 40 MG capsule; One a day  Dispense: 90 capsule; Refill: 1  6. 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  Controlled.  Stable.  Continue Lyrica 200 mg twice a day. - pregabalin (LYRICA) 200 MG capsule; Take 1 capsule (200 mg total) by mouth 2 (two) times daily.  Dispense: 60 capsule; Refill: 5  7. Idiopathic peripheral neuropathy Chronic.  Controlled.  Stable.  Continue Lyrica 200 mg twice a day. - pregabalin (LYRICA) 200 MG capsule; Take 1 capsule (200 mg total) by mouth 2 (two) times daily.  Dispense: 60 capsule; Refill: 5  8. Moderate episode of recurrent major depressive disorder ([place]) .  Controlled.  Stable.  PHQ is 0.  Continue sertraline 25 mg once a day. - sertraline (ZOLOFT) 50 MG tablet; TAKE 1/2 TABLET(25 MG) BY MOUTH DAILY  Dispense: 45 tablet;  Refill: 1  9. Essential hypertension Chronic.  Controlled.  Stable.  Continue current medications.  Will check CMP. - Comprehensive Metabolic Panel (CMET)  10. Acute maxillary sinusitis, recurrence not specified Acute.  Persistent.  Uncomplicated.  Patient has exam and history consistent with an acute sinusitis.  Will initiate amoxicillin 500 mg 3 times a day. - amoxicillin (AMOXIL) 500 MG capsule; Take 1 capsule (500 mg total) by mouth 3 (three) times daily.  Dispense: 30 capsule; Refill: 0

## 2020-01-16 LAB — CBC WITH DIFFERENTIAL/PLATELET
Basophils Absolute: 0.1 10*3/uL (ref 0.0–0.2)
Basos: 1 %
EOS (ABSOLUTE): 0.2 10*3/uL (ref 0.0–0.4)
Eos: 3 %
Hematocrit: 31.5 % — ABNORMAL LOW (ref 37.5–51.0)
Hemoglobin: 10.9 g/dL — ABNORMAL LOW (ref 13.0–17.7)
Immature Grans (Abs): 0 10*3/uL (ref 0.0–0.1)
Immature Granulocytes: 0 %
Lymphocytes Absolute: 0.9 10*3/uL (ref 0.7–3.1)
Lymphs: 20 %
MCH: 31.7 pg (ref 26.6–33.0)
MCHC: 34.6 g/dL (ref 31.5–35.7)
MCV: 92 fL (ref 79–97)
Monocytes Absolute: 0.6 10*3/uL (ref 0.1–0.9)
Monocytes: 12 %
Neutrophils Absolute: 3 10*3/uL (ref 1.4–7.0)
Neutrophils: 64 %
Platelets: 195 10*3/uL (ref 150–450)
RBC: 3.44 x10E6/uL — ABNORMAL LOW (ref 4.14–5.80)
RDW: 13.7 % (ref 11.6–15.4)
WBC: 4.7 10*3/uL (ref 3.4–10.8)

## 2020-01-16 LAB — COMPREHENSIVE METABOLIC PANEL
ALT: 10 IU/L (ref 0–44)
AST: 17 IU/L (ref 0–40)
Albumin/Globulin Ratio: 1.2 (ref 1.2–2.2)
Albumin: 4.1 g/dL (ref 3.8–4.8)
Alkaline Phosphatase: 75 IU/L (ref 39–117)
BUN/Creatinine Ratio: 14 (ref 10–24)
BUN: 19 mg/dL (ref 8–27)
Bilirubin Total: 0.3 mg/dL (ref 0.0–1.2)
CO2: 21 mmol/L (ref 20–29)
Calcium: 9.7 mg/dL (ref 8.6–10.2)
Chloride: 105 mmol/L (ref 96–106)
Creatinine, Ser: 1.37 mg/dL — ABNORMAL HIGH (ref 0.76–1.27)
GFR calc Af Amer: 61 mL/min/{1.73_m2} (ref >59)
GFR calc non Af Amer: 53 mL/min/{1.73_m2} — ABNORMAL LOW (ref >59)
Globulin, Total: 3.3 g/dL (ref 1.5–4.5)
Glucose: 92 mg/dL (ref 65–99)
Potassium: 4.7 mmol/L (ref 3.5–5.2)
Sodium: 141 mmol/L (ref 134–144)
Total Protein: 7.4 g/dL (ref 6.0–8.5)

## 2020-01-16 LAB — LIPID PANEL WITH LDL/HDL RATIO
Cholesterol, Total: 122 mg/dL (ref 100–199)
HDL: 28 mg/dL — ABNORMAL LOW (ref >39)
LDL Chol Calc (NIH): 68 mg/dL (ref 0–99)
LDL/HDL Ratio: 2.4 ratio (ref 0.0–3.6)
Triglycerides: 147 mg/dL (ref 0–149)
VLDL Cholesterol Cal: 26 mg/dL (ref 5–40)

## 2020-02-11 ENCOUNTER — Other Ambulatory Visit: Payer: Self-pay

## 2020-02-11 DIAGNOSIS — D518 Other vitamin B12 deficiency anemias: Secondary | ICD-10-CM

## 2020-02-11 MED ORDER — CYANOCOBALAMIN 1000 MCG/ML IJ SOLN: 1000.0000 ug | INTRAMUSCULAR | 2 refills | Status: DC

## 2020-03-17 DIAGNOSIS — E1169 Type 2 diabetes mellitus with other specified complication: Secondary | ICD-10-CM | POA: Diagnosis not present

## 2020-03-17 DIAGNOSIS — E785 Hyperlipidemia, unspecified: Secondary | ICD-10-CM | POA: Diagnosis not present

## 2020-03-17 DIAGNOSIS — D649 Anemia, unspecified: Secondary | ICD-10-CM | POA: Diagnosis not present

## 2020-03-17 DIAGNOSIS — Z794 Long term (current) use of insulin: Secondary | ICD-10-CM | POA: Diagnosis not present

## 2020-03-17 DIAGNOSIS — E1142 Type 2 diabetes mellitus with diabetic polyneuropathy: Secondary | ICD-10-CM | POA: Diagnosis not present

## 2020-03-17 LAB — HEMOGLOBIN A1C: Hemoglobin A1C: 5.4

## 2020-04-16 ENCOUNTER — Other Ambulatory Visit: Payer: Self-pay | Admitting: Family Medicine

## 2020-04-16 NOTE — Telephone Encounter (Signed)
Requested medication (s) are due for refill today -yes  Requested medication (s) are on the active medication list -yes  Future visit scheduled -yes  Last refill: 08/22/19  Notes to clinic: Request for medication listed from historical provider  Requested Prescriptions  Pending Prescriptions Disp Refills   B-D INSULIN SYRINGE 1CC/25GX1" 25G X 1" 1 ML MISC [Pharmacy Med Name: B-D #[id number] SYR/NDL 1ML 25GX1] 10 each     Sig: USE AS DIRECTED      There is no refill protocol information for this order        Requested Prescriptions  Pending Prescriptions Disp Refills   B-D INSULIN SYRINGE 1CC/25GX1" 25G X 1" 1 ML MISC [Pharmacy Med Name: B-D #[id number] SYR/NDL 1ML 25GX1] 10 each     Sig: USE AS DIRECTED      There is no refill protocol information for this order

## 2020-04-17 ENCOUNTER — Other Ambulatory Visit: Payer: Self-pay

## 2020-04-29 ENCOUNTER — Other Ambulatory Visit: Payer: Self-pay

## 2020-04-29 ENCOUNTER — Telehealth: Payer: Self-pay | Admitting: Family Medicine

## 2020-04-29 MED ORDER — TRAMADOL HCL 50 MG PO TABS: 50.0000 mg | ORAL_TABLET | Freq: Four times a day (QID) | ORAL | 5 refills | Status: DC | PRN

## 2020-04-29 NOTE — Telephone Encounter (Signed)
The last rx for Tramadol was sent on 11/07/19. #120 x 5 (filled by Dr. Jaynee Eagles while Dr. Krista Blue was out of the office). Per the [place] narcotic registry, the patient has only used three of the available refills.  I called [facility] and spoke to [place]. The refills have been voided due to Dr. Cathren Laine DEA authorization being expired. Dr. Jaynee Eagles is aware of this issue and is working on the problem.   In the meantime, the pharmacy needs a new prescription sent by Dr. Krista Blue.  No issues noted on [place] narcotic registry. Refill sent to Dr. Krista Blue for approval.

## 2020-04-29 NOTE — Addendum Note (Signed)
Addended by: Noberto Retort C on: 04/29/2020 10:51 AM   Modules accepted: Orders

## 2020-04-29 NOTE — Telephone Encounter (Signed)
Pt called for refill   traMADol (ULTRAM) 50 MG tablet    [id number] - [address], [address] AT [address]  [place], [place] [address]

## 2020-04-29 NOTE — Telephone Encounter (Signed)
Spoke to wife- it was about Tramadol. Dr Ronnald Ramp does not fill, this had to come from neuro or someone else. Wife understands to call them

## 2020-04-29 NOTE — Telephone Encounter (Unsigned)
Copied from [place] [phone]. Topic: General - Inquiry >> Apr 29, 2020  9:05 AM Mathis Bud wrote: Reason for CRM: Patient wife [name] called requesting PCP nurse to call back due to patients medication.  Call back [phone]

## 2020-05-06 ENCOUNTER — Encounter: Payer: Self-pay | Admitting: Neurology

## 2020-05-06 ENCOUNTER — Other Ambulatory Visit: Payer: Self-pay

## 2020-05-06 ENCOUNTER — Ambulatory Visit (INDEPENDENT_AMBULATORY_CARE_PROVIDER_SITE_OTHER): Payer: Medicare Other | Admitting: Neurology

## 2020-05-06 VITALS — BP 147/72 | HR 83 | Ht 70.0 in | Wt 224.0 lb

## 2020-05-06 DIAGNOSIS — E1142 Type 2 diabetes mellitus with diabetic polyneuropathy: Secondary | ICD-10-CM | POA: Diagnosis not present

## 2020-05-06 DIAGNOSIS — G35 Multiple sclerosis: Secondary | ICD-10-CM

## 2020-05-06 DIAGNOSIS — R269 Unspecified abnormalities of gait and mobility: Secondary | ICD-10-CM

## 2020-05-06 MED ORDER — BETASERON 0.3 MG ~~LOC~~ KIT: PACK | SUBCUTANEOUS | 4 refills | Status: DC

## 2020-05-06 NOTE — Progress Notes (Signed)
[facility]  HISTORY OF PRESENT ILLNESS:HISTORY:He has PMHx of diabetes, hypertension and multiple sclerosis. The diagnosis of multiple sclerosis was confirmed by abnormal MRI brain and cervical, and a spinal fluid testing. He was initially followed by Dr. Estella Husk in 2008. Last seen in this office Oct 2014, He continues to have gait difficulties and relies on a cane for ambulation. He has had no problems with his vision or bowel/bladder control.  He has had occasional falls.He has ongoing pain in his legs. Last MS flare was over 2 years ago. He is currently on Betaseron every other day, treatment started in 2007.   MRI cervical in 2013 with findings consistent with MS plaques and no enhancement,mild spinal stenosis.  MRI of the brain with prominent white matter changes consistent with MS. No active areas of demyelination. He continues to have problems with low   The initial symptom was acute onset of left leg more than arm weakness, gait difficulty in 2006. This eventually led to the diagnosis of relapsing remitting multiple sclerosis. He has been receiving Betaseron treatment since early 2007. He has tolerated the medication well. He has had no problems with lipoatrophy or skin necrosis. In September 2009, he developed GI bleeding. Workup had demonstrated colon inflammation and obstruction.He underwent colectomy and recovered well from that procedure.  He continues to have gait difficulties and relies on a cane for ambulation. He has had no problems with his vision or bowel/bladder control. He does have chronic extremity pain, which has been managed with Lyrica and Tramadol.   He uses an Transport planner for situations in which he would need to walk long distances. He has had occasional falls.He has ongoing pain in his legs   UPDATE 07/2014: He was admitted to hospital in Nov 2014 for anemia, low blood count, was evaluated by hematologist in [facility], now has  recovered, he still has moderate gait difficulty.also complains bilateral lower extremity spasticity, is taking Lyrica, there was no clinical flareups, last MRI was in 2013, there was significant lesions in brain, and cervical spine   UPDATE 09/03/2014: He has tried Baclofen 39m qhs, complains of excessive sleepiness, could not tolerate it, he still mows the yard, the most limitation is his gait difficulty,he denies bowel and bladder incontinence.  We have reviewed MRI togather, MRI scan of brain showing periventricular and subcortical white matter hyperintensities consistent with multiple sclerosis. Presence of atrophy of corpus callosum indicates chronic disease. No significant change compared with MRI 06/02/2012   MRI cervical spine showing mild spondylitic changes descibed above most prominent at C 3-4 where there is moderate left formaminal narrowing. Ill defined spinal cord hypertintensities at C 3-4 to c 6-7 likely represent chronic multiple sclerosis inactive plaques. No significant change compared with MRI C spine 06/02/2012  UPDATE 11//17/16 Jeremy [name] 69year old male returns for follow-up. He was last seen in this office 03/06/15.  He was diagnosed with multiple sclerosis in 2006. He is currently on Betaseron every other day without injection site issues.. He still remains fairly active mowing the yard, denies any bowel or bladder incontinence. He denies any sensory changes, double vision speech or swallowing difficulty. He ambulates with a cane. He has had a couple of falls since last seen. He returns for reevaluation  UPDATE May 17th 2017:YY He is with his wife at today's clinical visit, he continued to receive Betaseron every other day without significant side effect, he has significant gait difficulty, left side is weaker, complains of chronic fatigue, chronic low  back pain, bilateral lower extremity neuropathic pain, taking Lyrica, tramadol as needed, Reviewed laboratory evaluation,  A1c 7.9, CMP showed elevated creatinine 1.4, normal CBC We again personally reviewed MRI of the scan without contrast, most recent was in 2015, MRI of the brain showed multiple supratentorium lesions, no contrast enhancement, MRI of cervical spine showed ill-defined C3-C7 cord lesion, no contrast enhancement, no significant change compared to previous scans   Update September 22, 2017:YY Creatinine 1.27 in July 2018, he is overall doing very well, mild gait abnormality, use cane, still active at home, providing normal, no bowel bladder incontinence, diabetes, diabetic peripheral neuropathy, using Betaseron, last MRI was in 2015,  UPDATE May 01 2019: He is overall stable, continue ambulate with a cane, slow worsening gait abnormality, heat intolerance, I personally reviewed MRI of the brain with without contrast in December 2018: Multiple subcortical, periventricular nonenhancing lesion, no significant change compared to previous scan in October 2015  Laboratory evaluations in September 2019: Hg 12.4,  LDL 84, A1C 5.7  UPDATE May 06 2020: He is accompanied by his wife at today's clinic visit, he is overall stable, only slow as expected decline with aging, rely on his cane more, denies bowel and bladder incontinence, he tolerated Betaseron well, does not want to change  We reviewed MRI of the brain with without contrast together, most recently in December 2018, multiple supratentorial MS lesions, no change compared to previous scan in October 2015, T1 black holes, no contrast-enhancement  Laboratory evaluation in March 2021: CBC showed anemia hemoglobin of 10.9, CMP abnormal creatinine 1.37, lipid panel, LDL 68  REVIEW OF SYSTEMS: Full 14 system review of systems performed and notable only for those listed, all others are neg:  As above  ALLERGIES: Allergies  Allergen Reactions  . Betadine [Povidone Iodine]     HOME MEDICATIONS: Outpatient Medications Prior to Visit  Medication Sig  Dispense Refill  . aspirin 81 MG tablet Take 81 mg by mouth daily.    . B-D INSULIN SYRINGE 1CC/25GX1" 25G X 1" 1 ML MISC USE AS DIRECTED 10 each 11  . BD PEN NEEDLE NANO U/F 32G X 4 MM MISC     . cyanocobalamin (,VITAMIN B-12,) 1000 MCG/ML injection Inject 1 mL (1,000 mcg total) into the muscle every 14 (fourteen) days. 2 mL 2  . docusate sodium (COLACE) 50 MG capsule Take 50 mg by mouth daily. otc     . ferrous sulfate (FEROSUL) 325 (65 FE) MG tablet TAKE 1 TABLET(325 MG) BY MOUTH THREE TIMES DAILY 270 tablet 1  . furosemide (LASIX) 40 MG tablet TAKE 1 TABLET(40 MG) by mouth once a day (Patient taking differently: Take 40 mg by mouth every other day. ) 180 tablet 1  . gemfibrozil (LOPID) 600 MG tablet Take 1 tablet (600 mg total) by mouth 2 (two) times daily before a meal. 180 tablet 1  . Insulin Glargine (LANTUS) 100 UNIT/ML Solostar Pen Inject 36 Units into the skin daily. Dr Honor Junes    . Interferon Beta-1b (BETASERON) 0.3 MG KIT injection Inject subcutaneously 1 syringe every other day. 45 kit 4  . loratadine (CLARITIN) 10 MG tablet Take 1 tablet (10 mg total) by mouth daily. 90 tablet 1  . losartan (COZAAR) 25 MG tablet Take 1 tablet by mouth daily. singh    . metFORMIN (GLUCOPHAGE) 500 MG tablet Take 1 tablet by mouth 2 (two) times daily.    . mupirocin ointment (BACTROBAN) 2 % APPLY EXTERNALLY TO THE AFFECTED AREA TWICE DAILY 22  g 0  . omeprazole (PRILOSEC) 40 MG capsule One a day 90 capsule 1  . ONETOUCH ULTRA test strip TEST TID    . pregabalin (LYRICA) 200 MG capsule Take 1 capsule (200 mg total) by mouth 2 (two) times daily. 60 capsule 5  . sertraline (ZOLOFT) 50 MG tablet TAKE 1/2 TABLET(25 MG) BY MOUTH DAILY 45 tablet 1  . simvastatin (ZOCOR) 40 MG tablet Take 1 tablet (40 mg total) by mouth daily. 90 tablet 1  . traMADol (ULTRAM) 50 MG tablet Take 1 tablet (50 mg total) by mouth every 6 (six) hours as needed. for pain 120 tablet 5  . amoxicillin (AMOXIL) 500 MG capsule Take 1  capsule (500 mg total) by mouth 3 (three) times daily. 30 capsule 0   No facility-administered medications prior to visit.    PAST MEDICAL HISTORY: Past Medical History:  Diagnosis Date  . Chronic pain   . Depression   . Diabetes ([place])   . GERD (gastroesophageal reflux disease)   . Hyperlipemia   . Hypertension   . MS (multiple sclerosis) ([place])     PAST SURGICAL HISTORY: Past Surgical History:  Procedure Laterality Date  . COLECTOMY  06-2008  . COLONOSCOPY  2015   normal    FAMILY HISTORY: Family History  Problem Relation Age of Onset  . Lung cancer Mother   . Heart attack Father   . Heart attack Brother   . COPD Brother     SOCIAL HISTORY: Social History   Socioeconomic History  . Marital status: Married    Spouse name: [name]  . Number of children: 2  . Years of education: GED  . Highest education level: Not on file  Occupational History    Comment: Disabled  Tobacco Use  . Smoking status: Former Smoker    Packs/day: 1.50    Years: 30.00    Pack years: 45.00    Types: Cigarettes    Quit date: 01/23/2006    Years since quitting: 14.2  . Smokeless tobacco: Never Used  . Tobacco comment: N/A  Vaping Use  . Vaping Use: Never used  Substance and Sexual Activity  . Alcohol use: Yes    Alcohol/week: 0.0 standard drinks    Comment: rare; maybe 2 beers a year  . Drug use: No  . Sexual activity: Not Currently  Other Topics Concern  . Not on file  Social History Narrative   Patient is disabled.    Patient lives with his wife Duvid Smalls.    Patient has 2 children.       Social Determinants of Health   Financial Resource Strain: Low Risk   . Difficulty of Paying Living Expenses: Not hard at all  Food Insecurity: No Food Insecurity  . Worried About Charity fundraiser in the Last Year: Never true  . Ran Out of Food in the Last Year: Never true  Transportation Needs: No Transportation Needs  . Lack of Transportation (Medical): No  . Lack of  Transportation (Non-Medical): No  Physical Activity: Inactive  . Days of Exercise per Week: 0 days  . Minutes of Exercise per Session: 0 min  Stress: No Stress Concern Present  . Feeling of Stress : Not at all  Social Connections: Moderately Integrated  . Frequency of Communication with Friends and Family: More than three times a week  . Frequency of Social Gatherings with Friends and Family: Once a week  . Attends Religious Services: More than 4 times per year  .  Active Member of Clubs or Organizations: No  . Attends Archivist Meetings: Never  . Marital Status: Married  Human resources officer Violence: Not At Risk  . Fear of Current or Ex-Partner: No  . Emotionally Abused: No  . Physically Abused: No  . Sexually Abused: No     PHYSICAL EXAM  Vitals:   05/06/20 1428  BP: (!) 147/72  Pulse: 83  Weight: 224 lb (101.6 kg)  Height: _0  (1.778 m)   Body mass index is 32.14 kg/m.  Generalized: Well developed, obese male in no acute distress  Head: normocephalic and atraumatic,. Oropharynx benign  Neck: Supple,  Musculoskeletal: Mild left hemiparesis  Neurological examination   Mentation: Alert oriented to time, place, history taking and casual conversation Cranial nerve II-XII:  Pupils were equal round reactive to light extraocular movements were full, visual field were full on confrontational test. Facial sensation and strength were normal. hearing was intact to finger rubbing bilaterally. Uvula tongue midline. head turning and shoulder shrug were normal and symmetric. Motor: No significant upper or lower extremity proximal and distal muscle weakness, mild to moderate bilateral lower extremity spasticity Sensory: Length dependent decreased  light touch, pinprick, and  Vibration,  to mid shin  Coordination: finger-nose-finger, heel-to-shin bilaterally, no dysmetria Reflexes: Hyperreflexia of both lower extremities, plantar responses were flexor bilaterally. Gait and  Station:Need to push up to get up from seated position wide based, stiff, unsteady gait ambulates with single-point cane, with bilateral knee flexion DIAGNOSTIC DATA (LABS, IMAGING, TESTING) - I reviewed patient records, labs, notes, testing and imaging myself where available.  Lab Results  Component Value Date   WBC 4.7 01/15/2020   HGB 10.9 (L) 01/15/2020   HCT 31.5 (L) 01/15/2020   MCV 92 01/15/2020   PLT 195 01/15/2020      Component Value Date/Time   NA 141 01/15/2020 0954   NA 143 08/31/2013 0417   K 4.7 01/15/2020 0954   K 3.5 08/31/2013 0417   CL 105 01/15/2020 0954   CL 109 (H) 08/31/2013 0417   CO2 21 01/15/2020 0954   CO2 28 08/31/2013 0417   GLUCOSE 92 01/15/2020 0954   GLUCOSE 133 (H) 08/31/2013 0417   BUN 19 01/15/2020 0954   BUN 39 (H) 08/31/2013 0417   CREATININE 1.37 (H) 01/15/2020 0954   CREATININE 1.53 (H) 08/31/2013 0417   CALCIUM 9.7 01/15/2020 0954   CALCIUM 8.9 08/31/2013 0417   PROT 7.4 01/15/2020 0954   PROT 6.2 (L) 08/31/2013 0417   ALBUMIN 4.1 01/15/2020 0954   ALBUMIN 2.9 (L) 08/31/2013 0417   AST 17 01/15/2020 0954   AST 35 08/31/2013 0417   ALT 10 01/15/2020 0954   ALT 15 08/31/2013 0417   ALKPHOS 75 01/15/2020 0954   ALKPHOS 60 08/31/2013 0417   BILITOT 0.3 01/15/2020 0954   BILITOT 0.6 08/31/2013 0417   GFRNONAA 53 (L) 01/15/2020 0954   GFRNONAA 48 (L) 08/31/2013 0417   GFRAA 61 01/15/2020 0954   GFRAA 56 (L) 08/31/2013 0417   Lab Results  Component Value Date   CHOL 122 01/15/2020   HDL 28 (L) 01/15/2020   LDLCALC 68 01/15/2020   TRIG 147 01/15/2020   CHOLHDL 6.1 (H) 08/06/2016   Lab Results  Component Value Date   HGBA1C 5.5 09/14/2019    ASSESSMENT AND PLAN Relapsing Remitting Multiple sclerosis Gait abnormality Diabetic peripheral neuropathy  Most recent MRI was in 2018, showed multiple subcortical, periventricular nonenhancing lesion, no change compared to previous scan in  2015, no contrast-enhancement  To have  several discussion over the past few years, he desires to stay on Betaseron, no significant side effect, he reported worsening gait abnormality after he stopping Betaseron for 3 weeks in summer 2020, he does not want to consider switch to a different agent  Tramadol as needed for pain  Continue moderate exercise  Marcial Pacas, M.D. Ph.D.  [facility] [place], [place] [address] Phone: [phone] Fax:      [phone]

## 2020-05-19 DIAGNOSIS — R821 Myoglobinuria: Secondary | ICD-10-CM | POA: Diagnosis not present

## 2020-05-19 DIAGNOSIS — N182 Chronic kidney disease, stage 2 (mild): Secondary | ICD-10-CM | POA: Diagnosis not present

## 2020-05-19 LAB — MICROALBUMIN, URINE: Microalb, Ur: 16

## 2020-05-20 DIAGNOSIS — N183 Chronic kidney disease, stage 3 unspecified: Secondary | ICD-10-CM | POA: Insufficient documentation

## 2020-05-20 DIAGNOSIS — N1831 Chronic kidney disease, stage 3a: Secondary | ICD-10-CM | POA: Insufficient documentation

## 2020-05-26 DIAGNOSIS — I10 Essential (primary) hypertension: Secondary | ICD-10-CM | POA: Diagnosis not present

## 2020-05-26 DIAGNOSIS — N182 Chronic kidney disease, stage 2 (mild): Secondary | ICD-10-CM | POA: Diagnosis not present

## 2020-05-26 DIAGNOSIS — D631 Anemia in chronic kidney disease: Secondary | ICD-10-CM | POA: Diagnosis not present

## 2020-05-26 DIAGNOSIS — E1129 Type 2 diabetes mellitus with other diabetic kidney complication: Secondary | ICD-10-CM | POA: Diagnosis not present

## 2020-06-05 ENCOUNTER — Other Ambulatory Visit: Payer: Self-pay | Admitting: Family Medicine

## 2020-06-05 ENCOUNTER — Other Ambulatory Visit: Payer: Self-pay | Admitting: Neurology

## 2020-06-05 DIAGNOSIS — D518 Other vitamin B12 deficiency anemias: Secondary | ICD-10-CM

## 2020-06-05 NOTE — Telephone Encounter (Signed)
Requested medication (s) are due for refill today -yes  Requested medication (s) are on the active medication list -yes  Future visit scheduled -no  Last refill: 04/02/20  Notes to clinic: Request for medication not assigned to protocol.  Requested Prescriptions  Pending Prescriptions Disp Refills   cyanocobalamin (,VITAMIN B-12,) 1000 MCG/ML injection [Pharmacy Med Name: CYANOCOBALAMIN 1000MCG/ML INJ, 1ML] 2 mL 2    Sig: ADMINISTER 1 ML(1000 MCG) IN THE MUSCLE EVERY 14 DAYS      Off-Protocol Failed - 06/05/2020  9:59 AM      Failed - Medication not assigned to a protocol, review manually.      Passed - Valid encounter within last 12 months    Recent Outpatient Visits           4 months ago Mixed hyperlipidemia   [facility] Juline Patch, MD   10 months ago Taking medication for chronic disease   [facility] Juline Patch, MD   1 year ago Moderate episode of recurrent major depressive disorder [facility])   [facility] Juline Patch, MD   1 year ago Acute pansinusitis, recurrence not specified   [facility] Juline Patch, MD   1 year ago Moderate episode of recurrent major depressive disorder [facility])   [facility] Juline Patch, MD             Off-Protocol Failed - 06/05/2020  9:59 AM      Failed - Medication not assigned to a protocol, review manually.      Passed - Valid encounter within last 12 months    Recent Outpatient Visits           4 months ago Mixed hyperlipidemia   [facility] Juline Patch, MD   10 months ago Taking medication for chronic disease   [facility] Juline Patch, MD   1 year ago Moderate episode of recurrent major depressive disorder [facility])   [facility] Juline Patch, MD   1 year ago Acute pansinusitis, recurrence not specified   [facility] Juline Patch, MD   1 year ago Moderate episode of recurrent major depressive disorder [facility])   [facility] Juline Patch, MD                  Requested Prescriptions  Pending Prescriptions Disp Refills   cyanocobalamin (,VITAMIN B-12,) 1000 MCG/ML injection [Pharmacy Med Name: CYANOCOBALAMIN 1000MCG/ML INJ, 1ML] 2 mL 2    Sig: ADMINISTER 1 ML(1000 MCG) IN THE MUSCLE EVERY 14 DAYS      Off-Protocol Failed - 06/05/2020  9:59 AM      Failed - Medication not assigned to a protocol, review manually.      Passed - Valid encounter within last 12 months    Recent Outpatient Visits           4 months ago Mixed hyperlipidemia   [facility] Jul[facility] Patch, MD   10 months ago Taking medication for chronic disease   [facility] Juline Patch, MD   1 year ago Moderate episode of recurrent major depressive disorder [facility])   [facility] Juline Patch, MD   1 year ago Acute pansinusitis, recurrence not specified   [facility] Juline Patch, MD   1 year ago Moderate episode of recurrent major depressive disorder [facility])   [facility] Juline Patch, MD  Off-Protocol Failed - 06/05/2020  9:59 AM      Failed - Medication not assigned to a protocol, review manually.      Passed - Valid encounter within last 12 months    Recent Outpatient Visits           4 months ago Mixed hyperlipidemia   [facility] Juline Patch, MD   10 months ago Taking medication for chronic disease   [facility] Juline Patch, MD   1 year ago Moderate episode of recurrent major depressive disorder [facility])   [facility] Juline Patch, MD   1 year ago Acute pansinusitis, recurrence not specified   [facility] Juline Patch, MD   1 year ago Moderate episode of recurrent major depressive disorder [facility])   [facility] Juline Patch, MD

## 2020-06-11 DIAGNOSIS — B351 Tinea unguium: Secondary | ICD-10-CM | POA: Diagnosis not present

## 2020-06-11 DIAGNOSIS — E1142 Type 2 diabetes mellitus with diabetic polyneuropathy: Secondary | ICD-10-CM | POA: Diagnosis not present

## 2020-06-26 ENCOUNTER — Telehealth: Payer: Self-pay

## 2020-06-26 NOTE — Chronic Care Management (AMB) (Signed)
  Chronic Care Management   Note  06/26/2020 Name: Jeremy [name] MRN: [id number] DOB: 09/05/1951  Jeremy [name] is a 69 y.o. year old male who is a primary care patient of Juline Patch, MD. I reached out to [name] by phone today in response to a referral sent by Jeremy [name] health plan.     Mr. Reth was given information about Chronic Care Management services today including:  1. CCM service includes personalized support from designated clinical staff supervised by his physician, including individualized plan of care and coordination with other care providers 2. 24/7 contact phone numbers for assistance for urgent and routine care needs. 3. Service will only be billed when office clinical staff spend 20 minutes or more in a month to coordinate care. 4. Only one practitioner may furnish and bill the service in a calendar month. 5. The patient may stop CCM services at any time (effective at the end of the month) by phone call to the office staff. 6. The patient will be responsible for cost sharing (co-pay) of up to 20% of the service fee (after annual deductible is met).  Patient did not agree to enrollment in care management services and does not wish to consider at this time.  Follow up plan: The patient has been provided with contact information for the care management team and has been advised to call with any health related questions or concerns.   Noreene Larsson, [place], [place], [place] [address] Direct Dial: [phone] [email or web address]: [email or web address]

## 2020-07-07 ENCOUNTER — Encounter: Payer: Self-pay | Admitting: Family Medicine

## 2020-07-07 ENCOUNTER — Other Ambulatory Visit: Payer: Self-pay

## 2020-07-07 ENCOUNTER — Ambulatory Visit (INDEPENDENT_AMBULATORY_CARE_PROVIDER_SITE_OTHER): Payer: Medicare Other | Admitting: Family Medicine

## 2020-07-07 VITALS — BP 130/76 | HR 72 | Ht 70.0 in | Wt 223.0 lb

## 2020-07-07 DIAGNOSIS — F331 Major depressive disorder, recurrent, moderate: Secondary | ICD-10-CM | POA: Diagnosis not present

## 2020-07-07 DIAGNOSIS — R69 Illness, unspecified: Secondary | ICD-10-CM

## 2020-07-07 DIAGNOSIS — K219 Gastro-esophageal reflux disease without esophagitis: Secondary | ICD-10-CM

## 2020-07-07 DIAGNOSIS — G63 Polyneuropathy in diseases classified elsewhere: Secondary | ICD-10-CM | POA: Diagnosis not present

## 2020-07-07 DIAGNOSIS — G609 Hereditary and idiopathic neuropathy, unspecified: Secondary | ICD-10-CM | POA: Diagnosis not present

## 2020-07-07 DIAGNOSIS — E349 Endocrine disorder, unspecified: Secondary | ICD-10-CM | POA: Diagnosis not present

## 2020-07-07 DIAGNOSIS — Z23 Encounter for immunization: Secondary | ICD-10-CM

## 2020-07-07 DIAGNOSIS — E782 Mixed hyperlipidemia: Secondary | ICD-10-CM | POA: Diagnosis not present

## 2020-07-07 DIAGNOSIS — R601 Generalized edema: Secondary | ICD-10-CM

## 2020-07-07 DIAGNOSIS — T148XXA Other injury of unspecified body region, initial encounter: Secondary | ICD-10-CM

## 2020-07-07 DIAGNOSIS — D518 Other vitamin B12 deficiency anemias: Secondary | ICD-10-CM | POA: Diagnosis not present

## 2020-07-07 DIAGNOSIS — D649 Anemia, unspecified: Secondary | ICD-10-CM | POA: Diagnosis not present

## 2020-07-07 MED ORDER — FUROSEMIDE 40 MG PO TABS: 40.0000 mg | ORAL_TABLET | ORAL | 1 refills | Status: DC

## 2020-07-07 MED ORDER — SIMVASTATIN 40 MG PO TABS: 40.0000 mg | ORAL_TABLET | Freq: Every day | ORAL | 1 refills | Status: DC

## 2020-07-07 MED ORDER — GEMFIBROZIL 600 MG PO TABS: 600.0000 mg | ORAL_TABLET | Freq: Two times a day (BID) | ORAL | 1 refills | Status: DC

## 2020-07-07 MED ORDER — CYANOCOBALAMIN 1000 MCG/ML IJ SOLN: INTRAMUSCULAR | 1 refills | Status: DC

## 2020-07-07 MED ORDER — FERROUS SULFATE 325 (65 FE) MG PO TABS: ORAL_TABLET | ORAL | 1 refills | Status: DC

## 2020-07-07 MED ORDER — PREGABALIN 200 MG PO CAPS: 200.0000 mg | ORAL_CAPSULE | Freq: Two times a day (BID) | ORAL | 5 refills | Status: DC

## 2020-07-07 MED ORDER — SERTRALINE HCL 50 MG PO TABS: ORAL_TABLET | ORAL | 1 refills | Status: DC

## 2020-07-07 MED ORDER — OMEPRAZOLE 40 MG PO CPDR: DELAYED_RELEASE_CAPSULE | ORAL | 1 refills | Status: DC

## 2020-07-07 MED ORDER — MUPIROCIN 2 % EX OINT: TOPICAL_OINTMENT | CUTANEOUS | 0 refills | Status: DC

## 2020-07-07 NOTE — Progress Notes (Signed)
Date:  07/07/2020   Name:  Jeremy [name]   DOB:  1951-09-24   MRN:  [id number]   Chief Complaint: b12 def, Hyperlipidemia, Gastroesophageal Reflux, Abrasion (refill bactroban), Edema, Peripheral Neuropathy, Depression, and Flu Vaccine  Hyperlipidemia This is a chronic problem. The current episode started more than 1 year ago. The problem is controlled. Recent lipid tests were reviewed and are normal. Exacerbating diseases include diabetes. Pertinent negatives include no chest pain, focal sensory loss, focal weakness, leg pain, myalgias or shortness of breath. Current antihyperlipidemic treatment includes statins. The current treatment provides mild improvement of lipids.  Gastroesophageal Reflux He reports no abdominal pain, no belching, no chest pain, no choking, no coughing, no dysphagia, no early satiety, no hoarse voice, no nausea, no sore throat or no wheezing. neuropathy. This is a chronic problem. The problem has been gradually improving. Pertinent negatives include no fatigue. He has tried a PPI for the symptoms. The treatment provided moderate relief. Past procedures do not include an abdominal ultrasound.  Depression      (Neuropathy)  This is a chronic problem.  The current episode started more than 1 year ago.   The onset quality is gradual.   The problem has been gradually improving since onset.  Associated symptoms include insomnia.  Associated symptoms include no decreased concentration, no fatigue, no helplessness, no hopelessness, not irritable, no restlessness, no decreased interest, no appetite change, no body aches, no myalgias, no headaches, no indigestion, not sad and no suicidal ideas.  Past treatments include SSRIs - Selective serotonin reuptake inhibitors. Anemia Presents for follow-up visit. There has been no abdominal pain, anorexia, bruising/bleeding easily, confusion, fever, light-headedness, malaise/fatigue, pallor, palpitations or paresthesias. Signs of blood loss that  are not present include hematemesis and hematochezia.  Neurologic Problem The patient's pertinent negatives include no focal sensory loss or focal weakness. Primary symptoms comment: neuropathy. This is a chronic problem. The current episode started more than 1 year ago. The problem has been waxing and waning since onset. Pertinent negatives include no abdominal pain, back pain, chest pain, confusion, dizziness, fatigue, fever, headaches, light-headedness, nausea, neck pain, palpitations or shortness of breath.    Lab Results  Component Value Date   CREATININE 1.37 (H) 01/15/2020   BUN 19 01/15/2020   NA 141 01/15/2020   K 4.7 01/15/2020   CL 105 01/15/2020   CO2 21 01/15/2020   Lab Results  Component Value Date   CHOL 122 01/15/2020   HDL 28 (L) 01/15/2020   LDLCALC 68 01/15/2020   TRIG 147 01/15/2020   CHOLHDL 6.1 (H) 08/06/2016   Lab Results  Component Value Date   TSH 1.600 07/30/2014   Lab Results  Component Value Date   HGBA1C 5.4 03/17/2020   Lab Results  Component Value Date   WBC 4.7 01/15/2020   HGB 10.9 (L) 01/15/2020   HCT 31.5 (L) 01/15/2020   MCV 92 01/15/2020   PLT 195 01/15/2020   Lab Results  Component Value Date   ALT 10 01/15/2020   AST 17 01/15/2020   ALKPHOS 75 01/15/2020   BILITOT 0.3 01/15/2020     Review of Systems  Constitutional: Negative for appetite change, chills, fatigue, fever and malaise/fatigue.  HENT: Negative for drooling, ear discharge, ear pain, hoarse voice and sore throat.   Respiratory: Negative for cough, choking, shortness of breath and wheezing.   Cardiovascular: Negative for chest pain, palpitations and leg swelling.  Gastrointestinal: Negative for abdominal pain, anorexia, blood in stool, constipation, diarrhea,  dysphagia, hematemesis, hematochezia and nausea.  Endocrine: Negative for polydipsia.  Genitourinary: Negative for dysuria, frequency, hematuria and urgency.  Musculoskeletal: Negative for back pain, myalgias  and neck pain.  Skin: Negative for pallor and rash.  Allergic/Immunologic: Negative for environmental allergies.  Neurological: Negative for dizziness, focal weakness, light-headedness, headaches and paresthesias.  Hematological: Does not bruise/bleed easily.  Psychiatric/Behavioral: Positive for depression. Negative for confusion, decreased concentration and suicidal ideas. The patient has insomnia. The patient is not nervous/anxious.     Patient Active Problem List   Diagnosis Date Noted  . DM type 2 with diabetic peripheral neuropathy ([place]) 05/06/2020  . Idiopathic peripheral neuropathy 01/15/2020  . Generalized edema 01/15/2020  . Primary pulmonary hypertension ([place]) 10/28/2019  . Therapeutic drug monitoring 03/23/2018  . Mild episode of recurrent major depressive disorder ([place]) 09/26/2017  . Ventricular ectopic beats 09/26/2017  . Type 2 diabetes mellitus with diabetic polyneuropathy, with long-term current use of insulin ([place]) 08/12/2017  . Hyperlipidemia due to type 2 diabetes mellitus ([place]) 08/12/2017  . B12 deficiency 12/14/2016  . Low back pain 03/10/2016  . Lumbosacral disc disease 01/01/2016  . Neuropathy associated with endocrine disorder ([place]) 11/19/2015  . B12 deficiency anemia 06/03/2015  . Essential hypertension 06/03/2015  . Hyperlipidemia 06/03/2015  . Depression 06/03/2015  . Gastroesophageal reflux disease without esophagitis 06/03/2015  . Edema extremities 06/03/2015  . Multiple sclerosis ([place]) 07/26/2013  . Abnormality of gait 07/26/2013  . Morbid obesity ([place]) 07/26/2013    Allergies  Allergen Reactions  . Betadine [Povidone Iodine]     Past Surgical History:  Procedure Laterality Date  . COLECTOMY  06-2008  . COLONOSCOPY  2015   normal    Social History   Tobacco Use  . Smoking status: Former Smoker    Packs/day: 1.50    Years: 30.00    Pack years: 45.00    Types: Cigarettes    Quit date: 01/23/2006    Years since quitting: 14.4  .  Smokeless tobacco: Never Used  . Tobacco comment: N/A  Vaping Use  . Vaping Use: Never used  Substance Use Topics  . Alcohol use: Yes    Alcohol/week: 0.0 standard drinks    Comment: rare; maybe 2 beers a year  . Drug use: No     Medication list has been reviewed and updated.  Current Meds  Medication Sig  . aspirin 81 MG tablet Take 81 mg by mouth daily.  . B-D INSULIN SYRINGE 1CC/25GX1" 25G X 1" 1 ML MISC USE AS DIRECTED  . BD PEN NEEDLE NANO U/F 32G X 4 MM MISC   . cyanocobalamin (,VITAMIN B-12,) 1000 MCG/ML injection ADMINISTER 1 ML(1000 MCG) IN THE MUSCLE EVERY 14 DAYS  . ferrous sulfate (FEROSUL) 325 (65 FE) MG tablet TAKE 1 TABLET(325 MG) BY MOUTH THREE TIMES DAILY  . furosemide (LASIX) 40 MG tablet TAKE 1 TABLET(40 MG) by mouth once a day (Patient taking differently: Take 40 mg by mouth every other day. )  . gemfibrozil (LOPID) 600 MG tablet Take 1 tablet (600 mg total) by mouth 2 (two) times daily before a meal.  . Insulin Glargine (LANTUS) 100 UNIT/ML Solostar Pen Inject 36 Units into the skin daily. Dr Honor Junes  . Interferon Beta-1b (BETASERON) 0.3 MG KIT injection Inject subcutaneously 1 syringe every other day.  . loratadine (CLARITIN) 10 MG tablet Take 1 tablet (10 mg total) by mouth daily.  Marland Kitchen losartan (COZAAR) 25 MG tablet Take 1 tablet by mouth daily. singh  . metFORMIN (GLUCOPHAGE)  500 MG tablet Take 1 tablet by mouth 2 (two) times daily.  . mupirocin ointment (BACTROBAN) 2 % APPLY EXTERNALLY TO THE AFFECTED AREA TWICE DAILY  . omeprazole (PRILOSEC) 40 MG capsule One a day  . ONETOUCH ULTRA test strip TEST TID  . pregabalin (LYRICA) 200 MG capsule Take 1 capsule (200 mg total) by mouth 2 (two) times daily.  . sertraline (ZOLOFT) 50 MG tablet TAKE 1/2 TABLET(25 MG) BY MOUTH DAILY  . simvastatin (ZOCOR) 40 MG tablet Take 1 tablet (40 mg total) by mouth daily.  . traMADol (ULTRAM) 50 MG tablet Take 1 tablet (50 mg total) by mouth every 6 (six) hours as needed. for  pain    PHQ 2/9 Scores 07/07/2020 01/15/2020 11/05/2019 07/16/2019  PHQ - 2 Score 0 0 0 0  PHQ- 9 Score 2 0 - 0    GAD 7 : Generalized Anxiety Score 07/07/2020 01/15/2020  Nervous, Anxious, on Edge 0 0  Control/stop worrying 0 0  Worry too much - different things 0 0  Trouble relaxing 0 0  Restless 0 0  Easily annoyed or irritable 0 0  Afraid - awful might happen 0 0  Total GAD 7 Score 0 0    BP Readings from Last 3 Encounters:  07/07/20 130/76  05/06/20 (!) 147/72  01/15/20 120/60    Physical Exam Vitals and nursing note reviewed.  Constitutional:      General: He is not irritable. HENT:     Head: Normocephalic.     Right Ear: External ear normal.     Left Ear: External ear normal.     Nose: Nose normal.  Eyes:     General: No scleral icterus.       Right eye: No discharge.        Left eye: No discharge.     Conjunctiva/sclera: Conjunctivae normal.     Pupils: Pupils are equal, round, and reactive to light.  Neck:     Thyroid: No thyromegaly.     Vascular: No JVD.     Trachea: No tracheal deviation.  Cardiovascular:     Rate and Rhythm: Normal rate and regular rhythm.     Heart sounds: Normal heart sounds, S1 normal and S2 normal. No murmur heard.  No systolic murmur is present.  No diastolic murmur is present.  No friction rub. No gallop. No S3 or S4 sounds.   Pulmonary:     Effort: No respiratory distress.     Breath sounds: Normal breath sounds. No wheezing or rales.  Abdominal:     General: Bowel sounds are normal.     Palpations: Abdomen is soft. There is no mass.     Tenderness: There is no abdominal tenderness. There is no guarding or rebound.  Musculoskeletal:        General: No tenderness. Normal range of motion.     Cervical back: Normal range of motion and neck supple.     Right lower leg: No edema.     Left lower leg: No edema.  Lymphadenopathy:     Cervical: No cervical adenopathy.  Skin:    General: Skin is warm.     Findings: Abrasion and  bruising present. No rash.  Neurological:     Mental Status: He is alert and oriented to person, place, and time.     Cranial Nerves: No cranial nerve deficit.     Deep Tendon Reflexes: Reflexes are normal and symmetric.     Wt Readings from Last 3  Encounters:  07/07/20 223 lb (101.2 kg)  05/06/20 224 lb (101.6 kg)  01/15/20 225 lb (102.1 kg)    BP 130/76   Pulse 72   Ht 5' 10" (1.778 m)   Wt 223 lb (101.2 kg)   BMI 32.00 kg/m   Assessment and Plan: 1. 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  Controlled.  Stable.  PHQ 2.  Gad score 0.  Continue Zoloft one half of a 50 mg for 25 mg dosage daily. - sertraline (ZOLOFT) 50 MG tablet; TAKE 1/2 TABLET(25 MG) BY MOUTH DAILY  Dispense: 45 tablet; Refill: 1  2. Anemia, unspecified type .  Controlled.  Stable.  Continue ferrous sulfate 325 mg once a day. - ferrous sulfate (FEROSUL) 325 (65 FE) MG tablet; TAKE 1 TABLET(325 MG) BY MOUTH THREE TIMES DAILY  Dispense: 270 tablet; Refill: 1 - CBC w/Diff/Platelet  3. Mixed hyperlipidemia Chronic.  Controlled.  Stable.  Continue gemfibrozil 600 mg twice a day and simvastatin 40 mg once a day.  Will check lipid panel. - gemfibrozil (LOPID) 600 MG tablet; Take 1 tablet (600 mg total) by mouth 2 (two) times daily before a meal.  Dispense: 180 tablet; Refill: 1 - simvastatin (ZOCOR) 40 MG tablet; Take 1 tablet (40 mg total) by mouth daily.  Dispense: 90 tablet; Refill: 1 - Lipid Panel With LDL/HDL Ratio  4. Generalized edema Chronic.  Controlled.  Stable. His edema doing well.  We will continue furosemide 40 mg 1 tablet daily. - furosemide (LASIX) 40 MG tablet; Take 1 tablet (40 mg total) by mouth every other day.  Dispense: 45 tablet; Refill: 1  5. Gastroesophageal reflux disease without esophagitis                                                              chronic.  Controlled.  Stable.  Continue Prilosec 40 mg once a day. - omeprazole (PRILOSEC) 40 MG capsule;  One a day  Dispense: 90 capsule; Refill: 1  6. 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  Controlled.  Stable.  Continue 200 mg Lyrica twice a day. - pregabalin (LYRICA) 200 MG capsule; Take 1 capsule (200 mg total) by mouth 2 (two) times daily.  Dispense: 60 capsule; Refill: 5  7. Other vitamin B12 deficiency anemia Chronic.  Controlled.  Stable.  Will check CBC for evaluation in the meantime we will continue B12 injections 1000 mg/mL 1 mL q. 14 days. - cyanocobalamin (,VITAMIN B-12,) 1000 MCG/ML injection; ADMINISTER 1 ML(1000 MCG) IN THE MUSCLE EVERY 14 DAYS  Dispense: 2 mL; Refill: 1 - CBC w/Diff/Platelet  8. Idiopathic peripheral neuropathy Chronic.  Controlled.  Stable.  Continue Lyrica 200 mg twice a day. - pregabalin (LYRICA) 200 MG capsule; Take 1 capsule (200 mg total) by mouth 2 (two) times daily.  Dispense: 60 capsule; Refill: 5  9. Abrasion New onset.  Stable.  Patient has ecchymoses and abrasions on the right upper extremity due to arise wheelchair mishap.  Patient was given Bactroban to use on areas of preparation.  10. Taking medication for chronic disease Patient currently taking statin and we will check hepatic panel for evaluation of hepatic function. - Hepatic function panel  11. Need for immunization against influenza Discussed and administered. - Flu Vaccine QUAD High Dose(Fluad)

## 2020-07-08 LAB — HEPATIC FUNCTION PANEL
ALT: 10 IU/L (ref 0–44)
AST: 17 IU/L (ref 0–40)
Albumin: 4.1 g/dL (ref 3.8–4.8)
Alkaline Phosphatase: 66 IU/L (ref 44–121)
Bilirubin Total: <0.2 mg/dL (ref 0.0–1.2)
Bilirubin, Direct: <0.1 mg/dL (ref 0.00–0.40)
Total Protein: 7.2 g/dL (ref 6.0–8.5)

## 2020-07-08 LAB — CBC WITH DIFFERENTIAL/PLATELET
Basophils Absolute: 0 10*3/uL (ref 0.0–0.2)
Basos: 1 %
EOS (ABSOLUTE): 0.2 10*3/uL (ref 0.0–0.4)
Eos: 5 %
Hematocrit: 30.6 % — ABNORMAL LOW (ref 37.5–51.0)
Hemoglobin: 10.3 g/dL — ABNORMAL LOW (ref 13.0–17.7)
Immature Grans (Abs): 0 10*3/uL (ref 0.0–0.1)
Immature Granulocytes: 0 %
Lymphocytes Absolute: 0.8 10*3/uL (ref 0.7–3.1)
Lymphs: 22 %
MCH: 33.3 pg — ABNORMAL HIGH (ref 26.6–33.0)
MCHC: 33.7 g/dL (ref 31.5–35.7)
MCV: 99 fL — ABNORMAL HIGH (ref 79–97)
Monocytes Absolute: 0.4 10*3/uL (ref 0.1–0.9)
Monocytes: 11 %
Neutrophils Absolute: 2.3 10*3/uL (ref 1.4–7.0)
Neutrophils: 61 %
Platelets: 176 10*3/uL (ref 150–450)
RBC: 3.09 x10E6/uL — ABNORMAL LOW (ref 4.14–5.80)
RDW: 13.5 % (ref 11.6–15.4)
WBC: 3.8 10*3/uL (ref 3.4–10.8)

## 2020-07-08 LAB — LIPID PANEL WITH LDL/HDL RATIO
Cholesterol, Total: 124 mg/dL (ref 100–199)
HDL: 28 mg/dL — ABNORMAL LOW (ref >39)
LDL Chol Calc (NIH): 69 mg/dL (ref 0–99)
LDL/HDL Ratio: 2.5 ratio (ref 0.0–3.6)
Triglycerides: 158 mg/dL — ABNORMAL HIGH (ref 0–149)
VLDL Cholesterol Cal: 27 mg/dL (ref 5–40)

## 2020-07-10 ENCOUNTER — Telehealth: Payer: Self-pay | Admitting: Family Medicine

## 2020-07-10 ENCOUNTER — Other Ambulatory Visit: Payer: Self-pay

## 2020-07-10 DIAGNOSIS — J0110 Acute frontal sinusitis, unspecified: Secondary | ICD-10-CM

## 2020-07-10 MED ORDER — AMOXICILLIN-POT CLAVULANATE 875-125 MG PO TABS: 1.0000 | ORAL_TABLET | Freq: Two times a day (BID) | ORAL | 0 refills | Status: DC

## 2020-07-10 NOTE — Progress Notes (Signed)
Sent in Augmentin.

## 2020-07-10 NOTE — Telephone Encounter (Signed)
Copied from [place] [id number]. Topic: Appointment Scheduling - Scheduling Inquiry for Clinic >> Jul 10, 2020  8:05 AM Oneta Rack wrote: Reason for CRM: patient experiencing sore throat, cough, no fever and sinus pressure seeking antibiotics and clincal advice  Advised patient nurse will follow up as per office

## 2020-08-28 ENCOUNTER — Telehealth: Payer: Self-pay | Admitting: *Deleted

## 2020-08-28 NOTE — Telephone Encounter (Addendum)
Mr. Monrreal dropped off his Betaseron patient assistance renewal paperwork today. It has been completed and signed by Dr. Krista Blue. It has been faxed and confirmed back to [employer] [place] [employer] (ph: [phone], Fax: [phone]). A copy has been mailed to the patient's home address for his records. A copy has also been sent for scanning.

## 2020-09-11 ENCOUNTER — Ambulatory Visit (INDEPENDENT_AMBULATORY_CARE_PROVIDER_SITE_OTHER): Payer: Medicare Other | Admitting: Family Medicine

## 2020-09-11 ENCOUNTER — Other Ambulatory Visit: Payer: Self-pay

## 2020-09-11 ENCOUNTER — Encounter: Payer: Self-pay | Admitting: Family Medicine

## 2020-09-11 VITALS — BP 150/76 | HR 88 | Temp 98.0°F | Ht 70.0 in | Wt 221.0 lb

## 2020-09-11 DIAGNOSIS — R059 Cough, unspecified: Secondary | ICD-10-CM

## 2020-09-11 DIAGNOSIS — J0100 Acute maxillary sinusitis, unspecified: Secondary | ICD-10-CM

## 2020-09-11 MED ORDER — AMOXICILLIN-POT CLAVULANATE 875-125 MG PO TABS: 1.0000 | ORAL_TABLET | Freq: Two times a day (BID) | ORAL | 0 refills | Status: DC

## 2020-09-11 MED ORDER — GUAIFENESIN-CODEINE 100-10 MG/5ML PO SYRP: 5.0000 mL | ORAL_SOLUTION | Freq: Four times a day (QID) | ORAL | 0 refills | Status: DC | PRN

## 2020-09-11 NOTE — Progress Notes (Signed)
Date:  09/11/2020   Name:  Jeremy [name]   DOB:  [birth year]   MRN:  [id number]   Chief Complaint: Sinusitis (cong, runny nose and headache)  Sinusitis This is a new problem. The current episode started yesterday. The problem has been gradually worsening since onset. There has been no fever. His pain is at a severity of 2/10. The pain is mild. Associated symptoms include congestion, coughing, headaches, sinus pressure and a sore throat. Pertinent negatives include no chills, diaphoresis, ear pain, hoarse voice, neck pain, shortness of breath, sneezing or swollen glands. Past treatments include acetaminophen. The treatment provided no relief.    Lab Results  Component Value Date   CREATININE 1.37 (H) 01/15/2020   BUN 19 01/15/2020   NA 141 01/15/2020   K 4.7 01/15/2020   CL 105 01/15/2020   CO2 21 01/15/2020   Lab Results  Component Value Date   CHOL 124 07/07/2020   HDL 28 (L) 07/07/2020   LDLCALC 69 07/07/2020   TRIG 158 (H) 07/07/2020   CHOLHDL 6.1 (H) 08/06/2016   Lab Results  Component Value Date   TSH 1.600 07/30/2014   Lab Results  Component Value Date   HGBA1C 5.4 03/17/2020   Lab Results  Component Value Date   WBC 3.8 07/07/2020   HGB 10.3 (L) 07/07/2020   HCT 30.6 (L) 07/07/2020   MCV 99 (H) 07/07/2020   PLT 176 07/07/2020   Lab Results  Component Value Date   ALT 10 07/07/2020   AST 17 07/07/2020   ALKPHOS 66 07/07/2020   BILITOT <0.2 07/07/2020     Review of Systems  Constitutional: Negative for chills, diaphoresis and fever.  HENT: Positive for congestion, sinus pressure and sore throat. Negative for drooling, ear discharge, ear pain, hoarse voice and sneezing.   Respiratory: Positive for cough. Negative for shortness of breath and wheezing.   Cardiovascular: Negative for chest pain, palpitations and leg swelling.  Gastrointestinal: Negative for abdominal pain, blood in stool, constipation, diarrhea and nausea.  Endocrine: Negative for  polydipsia.  Genitourinary: Negative for dysuria, frequency, hematuria and urgency.  Musculoskeletal: Negative for back pain, myalgias and neck pain.  Skin: Negative for rash.  Allergic/Immunologic: Negative for environmental allergies.  Neurological: Positive for headaches. Negative for dizziness.  Hematological: Does not bruise/bleed easily.  Psychiatric/Behavioral: Negative for suicidal ideas. The patient is not nervous/anxious.     Patient Active Problem List   Diagnosis Date Noted  . DM type 2 with diabetic peripheral neuropathy ([place]) 05/06/2020  . Idiopathic peripheral neuropathy 01/15/2020  . Generalized edema 01/15/2020  . Primary pulmonary hypertension ([place]) 10/28/2019  . Therapeutic drug monitoring 03/23/2018  . Mild episode of recurrent major depressive disorder ([place]) 09/26/2017  . Ventricular ectopic beats 09/26/2017  . Type 2 diabetes mellitus with diabetic polyneuropathy, with long-term current use of insulin ([place]) 08/12/2017  . Hyperlipidemia due to type 2 diabetes mellitus ([place]) 08/12/2017  . B12 deficiency 12/14/2016  . Low back pain 03/10/2016  . Lumbosacral disc disease 01/01/2016  . Neuropathy associated with endocrine disorder ([place]) 11/19/2015  . B12 deficiency anemia 06/03/2015  . Essential hypertension 06/03/2015  . Hyperlipidemia 06/03/2015  . Depression 06/03/2015  . Gastroesophageal reflux disease without esophagitis 06/03/2015  . Edema extremities 06/03/2015  . Multiple sclerosis ([place]) 07/26/2013  . Abnormality of gait 07/26/2013  . Morbid obesity ([place]) 07/26/2013    Allergies  Allergen Reactions  . Betadine [Povidone Iodine]     Past Surgical History:  Procedure Laterality Date  .  COLECTOMY  06-2008  . COLONOSCOPY  2015   normal    Social History   Tobacco Use  . Smoking status: Former Smoker    Packs/day: 1.50    Years: 30.00    Pack years: 45.00    Types: Cigarettes    Quit date: 01/23/2006    Years since quitting: 14.6  . Smokeless  tobacco: Never Used  . Tobacco comment: N/A  Vaping Use  . Vaping Use: Never used  Substance Use Topics  . Alcohol use: Yes    Alcohol/week: 0.0 standard drinks    Comment: rare; maybe 2 beers a year  . Drug use: No     Medication list has been reviewed and updated.  Current Meds  Medication Sig  . aspirin 81 MG tablet Take 81 mg by mouth daily.  . B-D INSULIN SYRINGE 1CC/25GX1" 25G X 1" 1 ML MISC USE AS DIRECTED  . BD PEN NEEDLE NANO U/F 32G X 4 MM MISC   . cyanocobalamin (,VITAMIN B-12,) 1000 MCG/ML injection ADMINISTER 1 ML(1000 MCG) IN THE MUSCLE EVERY 14 DAYS  . ferrous sulfate (FEROSUL) 325 (65 FE) MG tablet TAKE 1 TABLET(325 MG) BY MOUTH THREE TIMES DAILY  . furosemide (LASIX) 40 MG tablet Take 1 tablet (40 mg total) by mouth every other day.  Marland Kitchen gemfibrozil (LOPID) 600 MG tablet Take 1 tablet (600 mg total) by mouth 2 (two) times daily before a meal.  . Insulin Glargine (LANTUS) 100 UNIT/ML Solostar Pen Inject 36 Units into the skin daily. Dr Honor Junes  . Interferon Beta-1b (BETASERON) 0.3 MG KIT injection Inject subcutaneously 1 syringe every other day.  . loratadine (CLARITIN) 10 MG tablet Take 1 tablet (10 mg total) by mouth daily.  Marland Kitchen losartan (COZAAR) 25 MG tablet Take 1 tablet by mouth daily. singh  . metFORMIN (GLUCOPHAGE) 500 MG tablet Take 1 tablet by mouth 2 (two) times daily.  . mupirocin ointment (BACTROBAN) 2 % APPLY EXTERNALLY TO THE AFFECTED AREA TWICE DAILY  . omeprazole (PRILOSEC) 40 MG capsule One a day  . ONETOUCH ULTRA test strip TEST TID  . pregabalin (LYRICA) 200 MG capsule Take 1 capsule (200 mg total) by mouth 2 (two) times daily.  . sertraline (ZOLOFT) 50 MG tablet TAKE 1/2 TABLET(25 MG) BY MOUTH DAILY  . simvastatin (ZOCOR) 40 MG tablet Take 1 tablet (40 mg total) by mouth daily.  . traMADol (ULTRAM) 50 MG tablet Take 1 tablet (50 mg total) by mouth every 6 (six) hours as needed. for pain    PHQ 2/9 Scores 09/11/2020 07/07/2020 01/15/2020 11/05/2019   PHQ - 2 Score 0 0 0 0  PHQ- 9 Score 0 2 0 -    GAD 7 : Generalized Anxiety Score 09/11/2020 07/07/2020 01/15/2020  Nervous, Anxious, on Edge 0 0 0  Control/stop worrying 0 0 0  Worry too much - different things 0 0 0  Trouble relaxing 0 0 0  Restless 0 0 0  Easily annoyed or irritable 0 0 0  Afraid - awful might happen 0 0 0  Total GAD 7 Score 0 0 0    BP Readings from Last 3 Encounters:  09/11/20 (!) 150/76  07/07/20 130/76  05/06/20 (!) 147/72    Physical Exam Vitals and nursing note reviewed.  HENT:     Head: Normocephalic.     Jaw: There is normal jaw occlusion.     Right Ear: Tympanic membrane, ear canal and external ear normal.     Left Ear: Tympanic  membrane, ear canal and external ear normal.     Nose: No congestion or rhinorrhea.     Right Sinus: Maxillary sinus tenderness and frontal sinus tenderness present.     Left Sinus: Maxillary sinus tenderness and frontal sinus tenderness present.     Mouth/Throat:     Mouth: Mucous membranes are moist.  Eyes:     General: No scleral icterus.       Right eye: No discharge.        Left eye: No discharge.     Conjunctiva/sclera: Conjunctivae normal.     Pupils: Pupils are equal, round, and reactive to light.  Neck:     Thyroid: No thyromegaly.     Vascular: No JVD.     Trachea: No tracheal deviation.  Cardiovascular:     Rate and Rhythm: Normal rate and regular rhythm.     Heart sounds: Normal heart sounds. No murmur heard.  No friction rub. No gallop.   Pulmonary:     Effort: No respiratory distress.     Breath sounds: Normal breath sounds. No wheezing, rhonchi or rales.  Abdominal:     General: Bowel sounds are normal. There is no distension.     Palpations: Abdomen is soft. There is no mass.     Tenderness: There is no abdominal tenderness. There is no guarding or rebound.     Hernia: No hernia is present.  Musculoskeletal:        General: No tenderness. Normal range of motion.     Cervical back: Normal  range of motion and neck supple.  Lymphadenopathy:     Cervical: No cervical adenopathy.  Skin:    General: Skin is warm.     Capillary Refill: Capillary refill takes less than 2 seconds.     Findings: No rash.  Neurological:     General: No focal deficit present.     Mental Status: He is alert and oriented to person, place, and time.     Cranial Nerves: No cranial nerve deficit.     Motor: No weakness.     Deep Tendon Reflexes: Reflexes are normal and symmetric.     Wt Readings from Last 3 Encounters:  09/11/20 221 lb (100.2 kg)  07/07/20 223 lb (101.2 kg)  05/06/20 224 lb (101.6 kg)    BP (!) 150/76   Pulse 88   Temp 98 F (36.7 C) (Oral)   Ht _0  (1.778 m)   Wt 221 lb (100.2 kg)   SpO2 97%   BMI 31.71 kg/m   Assessment and Plan: 1. Acute maxillary sinusitis, recurrence not specified Acute.  Persistent.  Stable.  Early in the process but patient has MS and likes to be started early on medication.  Exam and history is consistent with an acute maxillary sinusitis.  Will initiate Augmentin 875 mg 1 twice a day for 10 days. - amoxicillin-clavulanate (AUGMENTIN) 875-125 MG tablet; Take 1 tablet by mouth 2 (two) times daily.  Dispense: 20 tablet; Refill: 0  2. Cough New onset.  Persistent.  Relatively stable.  With no dyspnea.  Patient has cough associated most likely with postnasal drainage.  Will initiate Robitussin-AC 1 teaspoon every 6 hours as needed cough. - guaiFENesin-codeine (ROBITUSSIN AC) 100-10 MG/5ML syrup; Take 5 mLs by mouth 4 (four) times daily as needed for cough.  Dispense: 118 mL; Refill: 0

## 2020-09-22 DIAGNOSIS — E785 Hyperlipidemia, unspecified: Secondary | ICD-10-CM | POA: Diagnosis not present

## 2020-09-22 DIAGNOSIS — E1142 Type 2 diabetes mellitus with diabetic polyneuropathy: Secondary | ICD-10-CM | POA: Diagnosis not present

## 2020-09-22 DIAGNOSIS — Z794 Long term (current) use of insulin: Secondary | ICD-10-CM | POA: Diagnosis not present

## 2020-09-22 DIAGNOSIS — E1169 Type 2 diabetes mellitus with other specified complication: Secondary | ICD-10-CM | POA: Diagnosis not present

## 2020-09-22 LAB — HM DIABETES FOOT EXAM: HM Diabetic Foot Exam: NORMAL

## 2020-09-22 LAB — HEMOGLOBIN A1C: Hemoglobin A1C: 5.3

## 2020-10-09 ENCOUNTER — Telehealth: Payer: Self-pay | Admitting: *Deleted

## 2020-10-09 NOTE — Telephone Encounter (Signed)
Received 2022 renewal for patient assistance program for Betaseron.  [employer] [place] [employer] Ph: [phone] Fax: [phone]  Healthcare section/prescription completed, signed by Dr. Krista Blue and faxed back. His medication list and insurance cared were attached.  Added note that [employer] will need to reach out to patient for completion of his part.

## 2020-10-27 DIAGNOSIS — Z23 Encounter for immunization: Secondary | ICD-10-CM | POA: Diagnosis not present

## 2020-10-29 ENCOUNTER — Other Ambulatory Visit: Payer: Self-pay

## 2020-10-29 ENCOUNTER — Telehealth: Payer: Medicare Other | Admitting: Family Medicine

## 2020-10-29 DIAGNOSIS — R059 Cough, unspecified: Secondary | ICD-10-CM

## 2020-10-29 MED ORDER — BENZONATATE 200 MG PO CAPS: 200.0000 mg | ORAL_CAPSULE | Freq: Two times a day (BID) | ORAL | 0 refills | Status: DC | PRN

## 2020-10-31 ENCOUNTER — Telehealth: Payer: Self-pay | Admitting: Family Medicine

## 2020-10-31 NOTE — Telephone Encounter (Signed)
Patient called to request that his AWV on 01/12 be a phone visit because he is sick with a cold and cannot come into the office.  Please call patient to confirm at [phone]

## 2020-10-31 NOTE — Telephone Encounter (Signed)
Received notification that patient has been approved for patient assistance from 10/30/2020 through 10/24/2021.

## 2020-10-31 NOTE — Telephone Encounter (Signed)
Made note for a VV instead of in office

## 2020-11-03 ENCOUNTER — Other Ambulatory Visit: Payer: Self-pay | Admitting: Family Medicine

## 2020-11-03 DIAGNOSIS — E782 Mixed hyperlipidemia: Secondary | ICD-10-CM

## 2020-11-05 ENCOUNTER — Ambulatory Visit (INDEPENDENT_AMBULATORY_CARE_PROVIDER_SITE_OTHER): Payer: Medicare Other

## 2020-11-05 ENCOUNTER — Other Ambulatory Visit: Payer: Self-pay

## 2020-11-05 DIAGNOSIS — R059 Cough, unspecified: Secondary | ICD-10-CM

## 2020-11-05 DIAGNOSIS — Z0000 Encounter for general adult medical examination without abnormal findings: Secondary | ICD-10-CM | POA: Diagnosis not present

## 2020-11-05 MED ORDER — BENZONATATE 200 MG PO CAPS: 200.0000 mg | ORAL_CAPSULE | Freq: Two times a day (BID) | ORAL | 0 refills | Status: DC | PRN

## 2020-11-05 NOTE — Progress Notes (Unsigned)
Sent in Provencal to [facility] [employer]

## 2020-11-05 NOTE — Patient Instructions (Signed)
Jeremy [name] , Thank you for taking time to come for your Medicare Wellness Visit. I appreciate your ongoing commitment to your health goals. Please review the following plan we discussed and let me know if I can assist you in the future.   Screening recommendations/referrals: Colonoscopy: done 2015. Repeat in 2025 Recommended yearly ophthalmology/optometry visit for glaucoma screening and checkup Recommended yearly dental visit for hygiene and checkup  Vaccinations: Influenza vaccine: done 07/07/20 Pneumococcal vaccine: done 01/09/19 Tdap vaccine: done 07/06/18 Shingles vaccine: Shingrix discussed. Please contact your pharmacy for coverage information.  Covid-19:  Done 12/02/19, 01/02/20 & 10/27/20  Advanced directives: Advance directive discussed with you today. Even though you declined this today please call our office should you change your mind and we can give you the proper paperwork for you to fill out.  Conditions/risks identified: Recommend continuing fall prevention in the home.   Next appointment: Follow up in one year for your annual wellness visit.   Preventive Care 40 Years and Older, Male Preventive care refers to lifestyle choices and visits with your health care provider that can promote health and wellness. What does preventive care include?  A yearly physical exam. This is also called an annual well check.  Dental exams once or twice a year.  Routine eye exams. Ask your health care provider how often you should have your eyes checked.  Personal lifestyle choices, including:  Daily care of your teeth and gums.  Regular physical activity.  Eating a healthy diet.  Avoiding tobacco and drug use.  Limiting alcohol use.  Practicing safe sex.  Taking low doses of aspirin every day.  Taking vitamin and mineral supplements as recommended by your health care provider. What happens during an annual well check? The services and screenings done by your health care provider  during your annual well check will depend on your age, overall health, lifestyle risk factors, and family history of disease. Counseling  Your health care provider may ask you questions about your:  Alcohol use.  Tobacco use.  Drug use.  Emotional well-being.  Home and relationship well-being.  Sexual activity.  Eating habits.  History of falls.  Memory and ability to understand (cognition).  Work and work Statistician. Screening  You may have the following tests or measurements:  Height, weight, and BMI.  Blood pressure.  Lipid and cholesterol levels. These may be checked every 5 years, or more frequently if you are over 72 years old.  Skin check.  Lung cancer screening. You may have this screening every year starting at age 56 if you have a 30-pack-year history of smoking and currently smoke or have quit within the past 15 years.  Fecal occult blood test (FOBT) of the stool. You may have this test every year starting at age 19.  Flexible sigmoidoscopy or colonoscopy. You may have a sigmoidoscopy every 5 years or a colonoscopy every 10 years starting at age 88.  Prostate cancer screening. Recommendations will vary depending on your family history and other risks.  Hepatitis C blood test.  Hepatitis B blood test.  Sexually transmitted disease (STD) testing.  Diabetes screening. This is done by checking your blood sugar (glucose) after you have not eaten for a while (fasting). You may have this done every 1-3 years.  Abdominal aortic aneurysm (AAA) screening. You may need this if you are a current or former smoker.  Osteoporosis. You may be screened starting at age 52 if you are at high risk. Talk with your health care  provider about your test results, treatment options, and if necessary, the need for more tests. Vaccines  Your health care provider may recommend certain vaccines, such as:  Influenza vaccine. This is recommended every year.  Tetanus,  diphtheria, and acellular pertussis (Tdap, Td) vaccine. You may need a Td booster every 10 years.  Zoster vaccine. You may need this after age 22.  Pneumococcal 13-valent conjugate (PCV13) vaccine. One dose is recommended after age 18.  Pneumococcal polysaccharide (PPSV23) vaccine. One dose is recommended after age 72. Talk to your health care provider about which screenings and vaccines you need and how often you need them. This information is not intended to replace advice given to you by your health care provider. Make sure you discuss any questions you have with your health care provider. Document Released: 11/07/2015 Document Revised: 06/30/2016 Document Reviewed: 08/12/2015 Elsevier Interactive Patient Education  2017 [place] Prevention in the Home Falls can cause injuries. They can happen to people of all ages. There are many things you can do to make your home safe and to help prevent falls. What can I do on the outside of my home?  Regularly fix the edges of walkways and driveways and fix any cracks.  Remove anything that might make you trip as you walk through a door, such as a raised step or threshold.  Trim any bushes or trees on the path to your home.  Use bright outdoor lighting.  Clear any walking paths of anything that might make someone trip, such as rocks or tools.  Regularly check to see if handrails are loose or broken. Make sure that both sides of any steps have handrails.  Any raised decks and porches should have guardrails on the edges.  Have any leaves, snow, or ice cleared regularly.  Use sand or salt on walking paths during winter.  Clean up any spills in your garage right away. This includes oil or grease spills. What can I do in the bathroom?  Use night lights.  Install grab bars by the toilet and in the tub and shower. Do not use towel bars as grab bars.  Use non-skid mats or decals in the tub or shower.  If you need to sit down in  the shower, use a plastic, non-slip stool.  Keep the floor dry. Clean up any water that spills on the floor as soon as it happens.  Remove soap buildup in the tub or shower regularly.  Attach bath mats securely with double-sided non-slip rug tape.  Do not have throw rugs and other things on the floor that can make you trip. What can I do in the bedroom?  Use night lights.  Make sure that you have a light by your bed that is easy to reach.  Do not use any sheets or blankets that are too big for your bed. They should not hang down onto the floor.  Have a firm chair that has side arms. You can use this for support while you get dressed.  Do not have throw rugs and other things on the floor that can make you trip. What can I do in the kitchen?  Clean up any spills right away.  Avoid walking on wet floors.  Keep items that you use a lot in easy-to-reach places.  If you need to reach something above you, use a strong step stool that has a grab bar.  Keep electrical cords out of the way.  Do not use floor polish  or wax that makes floors slippery. If you must use wax, use non-skid floor wax.  Do not have throw rugs and other things on the floor that can make you trip. What can I do with my stairs?  Do not leave any items on the stairs.  Make sure that there are handrails on both sides of the stairs and use them. Fix handrails that are broken or loose. Make sure that handrails are as long as the stairways.  Check any carpeting to make sure that it is firmly attached to the stairs. Fix any carpet that is loose or worn.  Avoid having throw rugs at the top or bottom of the stairs. If you do have throw rugs, attach them to the floor with carpet tape.  Make sure that you have a light switch at the top of the stairs and the bottom of the stairs. If you do not have them, ask someone to add them for you. What else can I do to help prevent falls?  Wear shoes that:  Do not have high  heels.  Have rubber bottoms.  Are comfortable and fit you well.  Are closed at the toe. Do not wear sandals.  If you use a stepladder:  Make sure that it is fully opened. Do not climb a closed stepladder.  Make sure that both sides of the stepladder are locked into place.  Ask someone to hold it for you, if possible.  Clearly mark and make sure that you can see:  Any grab bars or handrails.  First and last steps.  Where the edge of each step is.  Use tools that help you move around (mobility aids) if they are needed. These include:  Canes.  Walkers.  Scooters.  Crutches.  Turn on the lights when you go into a dark area. Replace any light bulbs as soon as they burn out.  Set up your furniture so you have a clear path. Avoid moving your furniture around.  If any of your floors are uneven, fix them.  If there are any pets around you, be aware of where they are.  Review your medicines with your doctor. Some medicines can make you feel dizzy. This can increase your chance of falling. Ask your doctor what other things that you can do to help prevent falls. This information is not intended to replace advice given to you by your health care provider. Make sure you discuss any questions you have with your health care provider. Document Released: 08/07/2009 Document Revised: 03/18/2016 Document Reviewed: 11/15/2014 Elsevier Interactive Patient Education  2017 [employer].

## 2020-11-05 NOTE — Progress Notes (Signed)
Subjective:   Jeremy [name] is a 70 y.o. male who presents for Medicare Annual/Subsequent preventive examination.  Virtual Visit via Telephone Note  I connected with  Jeremy [name] on 11/05/20 at  8:40 AM EST by telephone and verified that I am speaking with the correct person using two identifiers.  Location: Patient: home Provider: [facility] Persons participating in the virtual visit: [place]   I discussed the limitations, risks, security and privacy concerns of performing an evaluation and management service by telephone and the availability of in person appointments. The patient expressed understanding and agreed to proceed.  Interactive audio and video telecommunications were attempted between this nurse and patient, however failed, due to patient having technical difficulties OR patient did not have access to video capability.  We continued and completed visit with audio only.  Some vital signs may be absent or patient reported.   Jeremy Marker, LPN    Review of Systems     Cardiac Risk Factors include: advanced age (>82mn, >>74women);diabetes mellitus;dyslipidemia;male gender;hypertension;sedentary lifestyle;obesity (BMI >30kg/m2)     Objective:    There were no vitals filed for this visit. There is no height or weight on file to calculate BMI.  Advanced Directives 11/05/2020 11/05/2019 10/04/2018 09/29/2017 09/11/2015  Does Patient Have a Medical Advance Directive? No No No No Yes  Type of Advance Directive - - - - Living will  Copy of [place] Chart? - - - - No - copy requested  Would patient like information on creating a medical advance directive? No - Patient declined Yes (MAU/Ambulatory/Procedural Areas - Information given) Yes (MAU/Ambulatory/Procedural Areas - Information given) Yes (MAU/Ambulatory/Procedural Areas - Information given) -    Current Medications (verified) Outpatient Encounter Medications as of 11/05/2020   Medication Sig  . aspirin 81 MG tablet Take 81 mg by mouth daily.  . B-D INSULIN SYRINGE 1CC/25GX1" 25G X 1" 1 ML MISC USE AS DIRECTED  . BD PEN NEEDLE NANO U/F 32G X 4 MM MISC   . benzonatate (TESSALON) 200 MG capsule Take 1 capsule (200 mg total) by mouth 2 (two) times daily as needed for cough.  . cyanocobalamin (,VITAMIN B-12,) 1000 MCG/ML injection ADMINISTER 1 ML(1000 MCG) IN THE MUSCLE EVERY 14 DAYS  . ferrous sulfate (FEROSUL) 325 (65 FE) MG tablet TAKE 1 TABLET(325 MG) BY MOUTH THREE TIMES DAILY  . furosemide (LASIX) 40 MG tablet Take 1 tablet (40 mg total) by mouth every other day.  .Marland Kitchengemfibrozil (LOPID) 600 MG tablet TAKE 1 TABLET(600 MG) BY MOUTH TWICE DAILY BEFORE A MEAL  . Insulin Glargine (LANTUS) 100 UNIT/ML Solostar Pen Inject 36 Units into the skin daily. Dr OHonor Junes . Interferon Beta-1b (BETASERON) 0.3 MG KIT injection Inject subcutaneously 1 syringe every other day.  . loratadine (CLARITIN) 10 MG tablet Take 1 tablet (10 mg total) by mouth daily.  .Marland Kitchenlosartan (COZAAR) 25 MG tablet Take 1 tablet by mouth daily. singh  . metFORMIN (GLUCOPHAGE) 500 MG tablet Take 1 tablet by mouth 2 (two) times daily.  .Marland Kitchenomeprazole (PRILOSEC) 40 MG capsule One a day  . ONETOUCH ULTRA test strip TEST TID  . pregabalin (LYRICA) 200 MG capsule Take 1 capsule (200 mg total) by mouth 2 (two) times daily.  . sertraline (ZOLOFT) 50 MG tablet TAKE 1/2 TABLET(25 MG) BY MOUTH DAILY  . simvastatin (ZOCOR) 40 MG tablet Take 1 tablet (40 mg total) by mouth daily.  . traMADol (ULTRAM) 50 MG tablet Take 1 tablet (  50 mg total) by mouth every 6 (six) hours as needed. for pain  . [DISCONTINUED] amoxicillin-clavulanate (AUGMENTIN) 875-125 MG tablet Take 1 tablet by mouth 2 (two) times daily.  . [DISCONTINUED] docusate sodium (COLACE) 50 MG capsule Take 50 mg by mouth daily. otc  (Patient not taking: Reported on 09/11/2020)  . [DISCONTINUED] guaiFENesin-codeine (ROBITUSSIN AC) 100-10 MG/5ML syrup Take 5 mLs by  mouth 4 (four) times daily as needed for cough.  . [DISCONTINUED] mupirocin ointment (BACTROBAN) 2 % APPLY EXTERNALLY TO THE AFFECTED AREA TWICE DAILY   No facility-administered encounter medications on file as of 11/05/2020.    Allergies (verified) Betadine [povidone iodine]   History: Past Medical History:  Diagnosis Date  . Chronic pain   . Depression   . Diabetes ([place])   . GERD (gastroesophageal reflux disease)   . Hyperlipemia   . Hypertension   . MS (multiple sclerosis) ([place])    Past Surgical History:  Procedure Laterality Date  . COLECTOMY  06-2008  . COLONOSCOPY  2015   normal   Family History  Problem Relation Age of Onset  . Lung cancer Mother   . Heart attack Father   . Heart attack Brother   . COPD Brother    Social History   Socioeconomic History  . Marital status: Married    Spouse name: [name]  . Number of children: 2  . Years of education: GED  . Highest education level: Not on file  Occupational History    Comment: Disabled  Tobacco Use  . Smoking status: Former Smoker    Packs/day: 1.50    Years: 30.00    Pack years: 45.00    Types: Cigarettes    Quit date: 01/23/2006    Years since quitting: 14.7  . Smokeless tobacco: Never Used  . Tobacco comment: N/A  Vaping Use  . Vaping Use: Never used  Substance and Sexual Activity  . Alcohol use: Yes    Alcohol/week: 0.0 standard drinks    Comment: rare; maybe 2 beers a year  . Drug use: No  . Sexual activity: Not Currently  Other Topics Concern  . Not on file  Social History Narrative   Patient is disabled.    Patient lives with his wife Adrean Heitz.    Patient has 2 children.       Social Determinants of Health   Financial Resource Strain: Low Risk   . Difficulty of Paying Living Expenses: Not hard at all  Food Insecurity: No Food Insecurity  . Worried About Charity fundraiser in the Last Year: Never true  . Ran Out of Food in the Last Year: Never true  Transportation Needs: No  Transportation Needs  . Lack of Transportation (Medical): No  . Lack of Transportation (Non-Medical): No  Physical Activity: Inactive  . Days of Exercise per Week: 0 days  . Minutes of Exercise per Session: 0 min  Stress: No Stress Concern Present  . Feeling of Stress : Not at all  Social Connections: Moderately Isolated  . Frequency of Communication with Friends and Family: More than three times a week  . Frequency of Social Gatherings with Friends and Family: Once a week  . Attends Religious Services: Never  . Active Member of Clubs or Organizations: No  . Attends Archivist Meetings: Never  . Marital Status: Married    Tobacco Counseling Counseling given: Not Answered Comment: N/A   Clinical Intake:  Pre-visit preparation completed: Yes  Pain : No/denies pain  Nutritional Risks: None Diabetes: Yes CBG done?: No Did pt. bring in CBG monitor from home?: No  How often do you need to have someone help you when you read instructions, pamphlets, or other written materials from your doctor or pharmacy?: 1 - Never  Nutrition Risk Assessment:  Has the patient had any N/V/D within the last 2 months?  No  Does the patient have any non-healing wounds?  No  Has the patient had any unintentional weight loss or weight gain?  No   Diabetes:  Is the patient diabetic?  Yes  If diabetic, was a CBG obtained today?  No  Did the patient bring in their glucometer from home?  No  How often do you monitor your CBG's? Twice daily per patient.   Financial Strains and Diabetes Management:  Are you having any financial strains with the device, your supplies or your medication? No .  Does the patient want to be seen by Chronic Care Management for management of their diabetes?  No  Would the patient like to be referred to a Nutritionist or for Diabetic Management?  No   Diabetic Exams:  Diabetic Eye Exam: Completed per patient at [facility]. Will request records. .    Diabetic Foot Exam: Completed 09/22/20 Dr. Honor Junes.   Interpreter Needed?: No  Information entered by :: Jeremy Marker LPN   Activities of Daily Living In your present state of health, do you have any difficulty performing the following activities: 11/05/2020  Hearing? N  Comment declines hearing aids  Vision? N  Difficulty concentrating or making decisions? N  Walking or climbing stairs? Y  Dressing or bathing? N  Doing errands, shopping? N  Preparing Food and eating ? N  Using the Toilet? N  In the past six months, have you accidently leaked urine? N  Do you have problems with loss of bowel control? N  Managing your Medications? N  Managing your Finances? N  Housekeeping or managing your Housekeeping? N  Some recent data might be hidden    Patient Care Team: Juline Patch, MD as PCP - General (Family Medicine) Marcial Pacas, MD as Consulting Physician (Neurology) Samara Deist, DPM as Consulting Physician (Podiatry) Lonia Farber, MD as Consulting Physician (Internal Medicine) Magnus Sinning, MD as Consulting Physician (Nephrology)  Indicate any recent Medical Services you may have received from other than [facility] providers in the past year (date may be approximate).     Assessment:   This is a routine wellness examination for [place].  Hearing/Vision screen  Hearing Screening   125Hz  250Hz  500Hz  1000Hz  2000Hz  3000Hz  4000Hz  6000Hz  8000Hz   Right ear:           Left ear:           Comments: Pt denies hearing difficulty  Vision Screening Comments: Annual eye exams done at [facility]  Dietary issues and exercise activities discussed: Current Exercise Habits: The patient does not participate in regular exercise at present, Exercise limited by: orthopedic condition(s);neurologic condition(s)  Goals    . Prevent falls     Recommend to maintain adequate lighting in walkways, remove rugs that may cause slips or trips and continue to use cane for  assistance with ambulation.      Depression Screen PHQ 2/9 Scores 11/05/2020 09/11/2020 07/07/2020 01/15/2020 11/05/2019 07/16/2019 10/04/2018  PHQ - 2 Score 0 0 0 0 0 0 0  PHQ- 9 Score - 0 2 0 - 0 6    Fall Risk  Fall Risk  11/05/2020 09/11/2020 07/07/2020 01/15/2020 11/05/2019  Falls in the past year? 1 1 1 1 1   Number falls in past yr: 1 1 1 1 1   Injury with Fall? 0 0 0 0 0  Comment - - - - -  Risk Factor Category  - - - - -  Comment - - - - -  Risk for fall due to : History of fall(s);Impaired balance/gait;Impaired mobility - - History of fall(s);Impaired balance/gait Impaired balance/gait;Impaired mobility;History of fall(s)  Risk for fall due to: Comment - - - - -  Follow up Falls prevention discussed Falls evaluation completed Falls evaluation completed Falls evaluation completed Falls prevention discussed  Comment - - - - -    FALL RISK PREVENTION PERTAINING TO THE HOME:  Any stairs in or around the home? No  If so, are there any without handrails? No  Home free of loose throw rugs in walkways, pet beds, electrical cords, etc? Yes  Adequate lighting in your home to reduce risk of falls? Yes   ASSISTIVE DEVICES UTILIZED TO PREVENT FALLS:  Life alert? No  Use of a cane, walker or w/c? Yes  Grab bars in the bathroom? Yes  Shower chair or bench in shower? Yes  Elevated toilet seat or a handicapped toilet? Yes   TIMED UP AND GO:  Was the test performed? No . Telephonic visit.   Cognitive Function:     6CIT Screen 11/05/2020 11/05/2019 10/04/2018 09/29/2017  What Year? 0 points 0 points 0 points 0 points  What month? 0 points 0 points 0 points 0 points  What time? 0 points 0 points 0 points 0 points  Count back from 20 0 points 0 points 0 points 0 points  Months in reverse 0 points 0 points 0 points 0 points  Repeat phrase 0 points 2 points 2 points 2 points  Total Score 0 2 2 2     Immunizations Immunization History  Administered Date(s) Administered  . Fluad Quad(high  Dose 65+) 07/16/2019, 07/07/2020  . Influenza, High Dose Seasonal PF 08/09/2017, 07/06/2018  . Influenza,inj,Quad PF,6+ Mos 07/28/2015, 08/06/2016  . Influenza-Unspecified 07/25/2014  . Moderna Sars-Covid-2 Vaccination 12/02/2019, 01/02/2020, 10/27/2020  . Pneumococcal Conjugate-13 09/29/2017  . Pneumococcal Polysaccharide-23 01/09/2019  . Tdap 07/06/2018    TDAP status: Up to date  Flu Vaccine status: Up to date  Pneumococcal vaccine status: Up to date  Covid-19 vaccine status: Completed vaccines  Qualifies for Shingles Vaccine? Yes   Zostavax completed No   Shingrix Completed?: No.    Education has been provided regarding the importance of this vaccine. Patient has been advised to call insurance company to determine out of pocket expense if they have not yet received this vaccine. Advised may also receive vaccine at local pharmacy or Health Dept. Verbalized acceptance and understanding.  Screening Tests Health Maintenance  Topic Date Due  . OPHTHALMOLOGY EXAM  10/25/2020  . HEMOGLOBIN A1C  03/22/2021  . FOOT EXAM  09/22/2021  . COLONOSCOPY (Pts 45-72yr Insurance coverage will need to be confirmed)  10/25/2024  . TETANUS/TDAP  07/06/2028  . INFLUENZA VACCINE  Completed  . COVID-19 Vaccine  Completed  . Hepatitis C Screening  Completed  . PNA vac Low Risk Adult  Completed    Health Maintenance  Health Maintenance Due  Topic Date Due  . OPHTHALMOLOGY EXAM  10/25/2020    Colorectal cancer screening: Type of screening: Colonoscopy. Completed 2015. Repeat every 10 years  Lung Cancer Screening: (Low Dose CT Chest  recommended if Age 31-80 years, 48 pack-year currently smoking OR have quit w/in 15years.) does not qualify.   Additional Screening:  Hepatitis C Screening: does qualify; Completed 09/29/17  Vision Screening: Recommended annual ophthalmology exams for early detection of glaucoma and other disorders of the eye. Is the patient up to date with their annual eye  exam?  Yes  Who is the provider or what is the name of the office in which the patient attends annual eye exams? [facility] Screening: Recommended annual dental exams for proper oral hygiene  Community Resource Referral / Chronic Care Management: CRR required this visit?  No   CCM required this visit?  No      Plan:     I have personally reviewed and noted the following in the patient's chart:   . Medical and social history . Use of alcohol, tobacco or illicit drugs  . Current medications and supplements . Functional ability and status . Nutritional status . Physical activity . Advanced directives . List of other physicians . Hospitalizations, surgeries, and ER visits in previous 12 months . Vitals . Screenings to include cognitive, depression, and falls . Referrals and appointments  In addition, I have reviewed and discussed with patient certain preventive protocols, quality metrics, and best practice recommendations. A written personalized care plan for preventive services as well as general preventive health recommendations were provided to patient.     Jeremy Marker, LPN   [id number]   Nurse Notes: pt states he tested positive for Covid on 11/03/20 with sxs starting on 10/27/20 after receiving booster vaccine. Pt c/o cough, SOB and loss of taste and smell. Pt taking tessalon pearles without relief. Pt requests refill of robitussin AC sent to [facility] [place]. Pt advised to contact office if sxs worsen or persist.

## 2020-11-22 ENCOUNTER — Other Ambulatory Visit: Payer: Self-pay | Admitting: Neurology

## 2020-12-22 ENCOUNTER — Other Ambulatory Visit: Payer: Self-pay | Admitting: Family Medicine

## 2020-12-22 DIAGNOSIS — D518 Other vitamin B12 deficiency anemias: Secondary | ICD-10-CM

## 2020-12-22 MED ORDER — CYANOCOBALAMIN 1000 MCG/ML IJ SOLN: INTRAMUSCULAR | 0 refills | Status: DC

## 2020-12-22 NOTE — Telephone Encounter (Signed)
Medication: cyanocobalamin (,VITAMIN B-12,) 1000 MCG/ML injection [[id number]]   Has the patient contacted their pharmacy? YES (Agent: If no, request that the patient contact the pharmacy for the refill.) (Agent: If yes, when and what did the pharmacy advise?)  Preferred Pharmacy (with phone number or street name): [facility] [place], [place] [address] [place] [address] Phone: [phone] Fax: [phone] Hours: Not open 24 hours    Agent: Please be advised that RX refills may take up to 3 business days. We ask that you follow-up with your pharmacy.

## 2020-12-22 NOTE — Telephone Encounter (Signed)
Requested medication (s) are due for refill today: yes  Requested medication (s) are on the active medication list: yes  Last refill:  07/07/20 #81ml 1 refill  Future visit scheduled: no  Notes to clinic:  medication not assigned a protocol     Requested Prescriptions  Pending Prescriptions Disp Refills   cyanocobalamin (,VITAMIN B-12,) 1000 MCG/ML injection 2 mL 1    Sig: ADMINISTER 1 ML(1000 MCG) IN THE MUSCLE EVERY 14 DAYS      Off-Protocol Failed - 12/22/2020  9:41 AM      Failed - Medication not assigned to a protocol, review manually.      Passed - Valid encounter within last 12 months    Recent Outpatient Visits           3 months ago Acute maxillary sinusitis, recurrence not specified   [facility] Juline [facility], [facility]   5 months ago Moderate episode of recurrent major depressive disorder [facility])   [facility] Juline Patch, MD   11 months ago Mixed hyperlipidemia   [facility], [facility]   1 year ago Taking medication for chronic disease   [facility] Jul[facility], MD   1 year ago Moderate episode of recurrent major depressive disorder [facility])   [facility] Juline Patch, MD               Off-Protocol Failed - 12/22/2020  9:41 AM      Failed - Medication not assigned to a protocol, review manually.      Passed - Valid encounter within last 12 months    Recent Outpatient Visits           3 months ago Acute maxillary sinusitis, recurrence not specified   [facility], [facility]   5 months ago Moderate episode of recurrent major depressive disorder [facility])   [facility] Jul[facility], MD   11 months ago Mixed hyperlipidemia   [facility] Jul[facility], [facility]   1 year ago Taking medication for chronic disease   [facility], [facility]   1 year ago Moderate episode of recurrent major depressive disorder [facility])   [facility] Juline Patch, MD

## 2020-12-24 DIAGNOSIS — B351 Tinea unguium: Secondary | ICD-10-CM | POA: Diagnosis not present

## 2020-12-24 DIAGNOSIS — E1142 Type 2 diabetes mellitus with diabetic polyneuropathy: Secondary | ICD-10-CM | POA: Diagnosis not present

## 2020-12-25 ENCOUNTER — Ambulatory Visit
Admission: RE | Admit: 2020-12-25 | Discharge: 2020-12-25 | Disposition: A | Discharge status: Home / Self Care | Payer: Medicare Other | Source: Ambulatory Visit | Attending: Family Medicine | Admitting: Family Medicine

## 2020-12-25 ENCOUNTER — Encounter: Payer: Self-pay | Admitting: Family Medicine

## 2020-12-25 ENCOUNTER — Other Ambulatory Visit: Payer: Self-pay

## 2020-12-25 ENCOUNTER — Ambulatory Visit
Admission: RE | Admit: 2020-12-25 | Discharge: 2020-12-25 | Disposition: A | Discharge status: Home / Self Care | Payer: Medicare Other | Attending: Family Medicine | Admitting: Family Medicine

## 2020-12-25 ENCOUNTER — Ambulatory Visit (INDEPENDENT_AMBULATORY_CARE_PROVIDER_SITE_OTHER): Payer: Medicare Other | Admitting: Family Medicine

## 2020-12-25 VITALS — BP 120/60 | HR 80 | Ht 70.0 in | Wt 216.0 lb

## 2020-12-25 DIAGNOSIS — D649 Anemia, unspecified: Secondary | ICD-10-CM

## 2020-12-25 DIAGNOSIS — R0609 Other forms of dyspnea: Secondary | ICD-10-CM

## 2020-12-25 DIAGNOSIS — E782 Mixed hyperlipidemia: Secondary | ICD-10-CM

## 2020-12-25 DIAGNOSIS — R0600 Dyspnea, unspecified: Secondary | ICD-10-CM | POA: Diagnosis not present

## 2020-12-25 DIAGNOSIS — G609 Hereditary and idiopathic neuropathy, unspecified: Secondary | ICD-10-CM | POA: Diagnosis not present

## 2020-12-25 DIAGNOSIS — G63 Polyneuropathy in diseases classified elsewhere: Secondary | ICD-10-CM

## 2020-12-25 DIAGNOSIS — D518 Other vitamin B12 deficiency anemias: Secondary | ICD-10-CM

## 2020-12-25 DIAGNOSIS — U099 Post covid-19 condition, unspecified: Secondary | ICD-10-CM | POA: Insufficient documentation

## 2020-12-25 DIAGNOSIS — K219 Gastro-esophageal reflux disease without esophagitis: Secondary | ICD-10-CM | POA: Diagnosis not present

## 2020-12-25 DIAGNOSIS — F331 Major depressive disorder, recurrent, moderate: Secondary | ICD-10-CM | POA: Diagnosis not present

## 2020-12-25 DIAGNOSIS — E349 Endocrine disorder, unspecified: Secondary | ICD-10-CM | POA: Diagnosis not present

## 2020-12-25 MED ORDER — PREGABALIN 200 MG PO CAPS: 200.0000 mg | ORAL_CAPSULE | Freq: Two times a day (BID) | ORAL | 5 refills | Status: DC

## 2020-12-25 MED ORDER — SERTRALINE HCL 50 MG PO TABS: ORAL_TABLET | ORAL | 1 refills | Status: DC

## 2020-12-25 MED ORDER — LORATADINE 10 MG PO TABS: 10.0000 mg | ORAL_TABLET | Freq: Every day | ORAL | 1 refills | Status: DC

## 2020-12-25 MED ORDER — OMEPRAZOLE 40 MG PO CPDR: DELAYED_RELEASE_CAPSULE | ORAL | 1 refills | Status: DC

## 2020-12-25 MED ORDER — FERROUS SULFATE 325 (65 FE) MG PO TABS: ORAL_TABLET | ORAL | 1 refills | Status: DC

## 2020-12-25 MED ORDER — CYANOCOBALAMIN 1000 MCG/ML IJ SOLN
INTRAMUSCULAR | 0 refills | Status: DC
Start: 2020-12-25 — End: 2021-02-16

## 2020-12-25 MED ORDER — SIMVASTATIN 40 MG PO TABS: 40.0000 mg | ORAL_TABLET | Freq: Every day | ORAL | 1 refills | Status: DC

## 2020-12-25 MED ORDER — GEMFIBROZIL 600 MG PO TABS
ORAL_TABLET | ORAL | 1 refills | Status: DC
Start: 2020-12-25 — End: 2021-06-30

## 2020-12-25 NOTE — Progress Notes (Signed)
Date:  12/25/2020   Name:  Jeremy [name]   DOB:  1951-01-02   MRN:  [id number]   Chief Complaint: Depression, Hyperlipidemia, Peripheral Neuropathy, Gastroesophageal Reflux, Anemia, vitamin b12 deficiency, and Allergic Rhinitis   Depression        This is a chronic problem.  The problem has been gradually improving since onset.  Associated symptoms include no decreased concentration, no fatigue, no helplessness, no hopelessness, does not have insomnia, not irritable, no restlessness, no decreased interest, no appetite change, no body aches, no myalgias, no headaches, no indigestion, not sad and no suicidal ideas.  Past treatments include SSRIs - Selective serotonin reuptake inhibitors.  Compliance with treatment is good.  Previous treatment provided moderate relief.   Pertinent negatives include no hypothyroidism. Hyperlipidemia This is a chronic problem. The current episode started more than 1 year ago. The problem is controlled. Recent lipid tests were reviewed and are variable. He has no history of chronic renal disease, diabetes, hypothyroidism, liver disease, obesity or nephrotic syndrome. Associated symptoms include shortness of breath. Pertinent negatives include no chest pain, focal sensory loss, focal weakness, leg pain or myalgias. The current treatment provides moderate improvement of lipids. There are no compliance problems.  Risk factors for coronary artery disease include diabetes mellitus and hypertension.  Gastroesophageal Reflux He reports no abdominal pain, no belching, no chest pain, no choking, no coughing, no dysphagia, no early satiety, no globus sensation, no heartburn, no hoarse voice, no nausea, no sore throat, no stridor, no tooth decay or no wheezing. This is a chronic problem. The current episode started more than 1 year ago. The problem has been gradually improving. Pertinent negatives include no fatigue, melena or weight loss. He has tried a PPI for the symptoms. The  treatment provided moderate relief.  Anemia Presents for follow-up visit. There has been no abdominal pain, anorexia, bruising/bleeding easily, confusion, fever, leg swelling, light-headedness, malaise/fatigue, pallor, palpitations, paresthesias, pica or weight loss. Signs of blood loss that are not present include hematemesis, hematochezia and melena. There is no history of chronic renal disease or hypothyroidism. There are no compliance problems.   Shortness of Breath This is a new problem. The current episode started more than 1 month ago. The problem occurs daily. The problem has been waxing and waning. Pertinent negatives include no abdominal pain, chest pain, claudication, coryza, ear pain, fever, headaches, hemoptysis, leg pain, leg swelling, neck pain, orthopnea, PND, rash, rhinorrhea, sore throat, sputum production, swollen glands, syncope, vomiting or wheezing. Associated symptoms comments: No orthopnea/PND. The treatment provided moderate relief.    Lab Results  Component Value Date   CREATININE 1.37 (H) 01/15/2020   BUN 19 01/15/2020   NA 141 01/15/2020   K 4.7 01/15/2020   CL 105 01/15/2020   CO2 21 01/15/2020   Lab Results  Component Value Date   CHOL 124 07/07/2020   HDL 28 (L) 07/07/2020   LDLCALC 69 07/07/2020   TRIG 158 (H) 07/07/2020   CHOLHDL 6.1 (H) 08/06/2016   Lab Results  Component Value Date   TSH 1.600 07/30/2014   Lab Results  Component Value Date   HGBA1C 5.3 09/22/2020   Lab Results  Component Value Date   WBC 3.8 07/07/2020   HGB 10.3 (L) 07/07/2020   HCT 30.6 (L) 07/07/2020   MCV 99 (H) 07/07/2020   PLT 176 07/07/2020   Lab Results  Component Value Date   ALT 10 07/07/2020   AST 17 07/07/2020   ALKPHOS 66 07/07/2020  BILITOT <0.2 07/07/2020     Review of Systems  Constitutional: Negative for appetite change, chills, fatigue, fever, malaise/fatigue and weight loss.  HENT: Negative for drooling, ear discharge, ear pain, hoarse voice,  rhinorrhea and sore throat.   Respiratory: Positive for shortness of breath. Negative for cough, hemoptysis, sputum production, choking and wheezing.   Cardiovascular: Negative for chest pain, palpitations, orthopnea, claudication, leg swelling, syncope and PND.  Gastrointestinal: Negative for abdominal pain, anorexia, blood in stool, constipation, diarrhea, dysphagia, heartburn, hematemesis, hematochezia, melena, nausea and vomiting.  Endocrine: Negative for polydipsia.  Genitourinary: Negative for dysuria, frequency, hematuria and urgency.  Musculoskeletal: Negative for back pain, myalgias and neck pain.  Skin: Negative for color change, pallor and rash.  Allergic/Immunologic: Negative for environmental allergies.  Neurological: Negative for dizziness, focal weakness, light-headedness, headaches and paresthesias.  Hematological: Does not bruise/bleed easily.  Psychiatric/Behavioral: Positive for depression. Negative for confusion, decreased concentration and suicidal ideas. The patient is not nervous/anxious and does not have insomnia.     Patient Active Problem List   Diagnosis Date Noted  . Chronic kidney disease, stage 3 unspecified ([place]) 05/20/2020  . DM type 2 with diabetic peripheral neuropathy ([place]) 05/06/2020  . Idiopathic peripheral neuropathy 01/15/2020  . Generalized edema 01/15/2020  . Primary pulmonary hypertension ([place]) 10/28/2019  . Therapeutic drug monitoring 03/23/2018  . Mild episode of recurrent major depressive disorder ([place]) 09/26/2017  . Ventricular ectopic beats 09/26/2017  . Type 2 diabetes mellitus with diabetic polyneuropathy, with long-term current use of insulin ([place]) 08/12/2017  . Hyperlipidemia due to type 2 diabetes mellitus ([place]) 08/12/2017  . B12 deficiency 12/14/2016  . Low back pain 03/10/2016  . Lumbosacral disc disease 01/01/2016  . Neuropathy associated with endocrine disorder ([place]) 11/19/2015  . B12 deficiency anemia 06/03/2015  . Essential  hypertension 06/03/2015  . Hyperlipidemia 06/03/2015  . Depression 06/03/2015  . Gastroesophageal reflux disease without esophagitis 06/03/2015  . Edema extremities 06/03/2015  . Multiple sclerosis ([place]) 07/26/2013  . Abnormality of gait 07/26/2013  . Morbid obesity ([place]) 07/26/2013    Allergies  Allergen Reactions  . Betadine [Povidone Iodine]     Past Surgical History:  Procedure Laterality Date  . COLECTOMY  06-2008  . COLONOSCOPY  2015   normal    Social History   Tobacco Use  . Smoking status: Former Smoker    Packs/day: 1.50    Years: 30.00    Pack years: 45.00    Types: Cigarettes    Quit date: 01/23/2006    Years since quitting: 14.9  . Smokeless tobacco: Never Used  . Tobacco comment: N/A  Vaping Use  . Vaping Use: Never used  Substance Use Topics  . Alcohol use: Yes    Alcohol/week: 0.0 standard drinks    Comment: rare; maybe 2 beers a year  . Drug use: No     Medication list has been reviewed and updated.  Current Meds  Medication Sig  . aspirin 81 MG tablet Take 81 mg by mouth daily.  . B-D INSULIN SYRINGE 1CC/25GX1" 25G X 1" 1 ML MISC USE AS DIRECTED  . BD PEN NEEDLE NANO U/F 32G X 4 MM MISC   . furosemide (LASIX) 40 MG tablet Take 1 tablet (40 mg total) by mouth every other day. (Patient taking differently: Take 20 mg by mouth every other day.)  . Insulin Glargine (LANTUS) 100 UNIT/ML Solostar Pen Inject 32 Units into the skin daily. Dr Honor Junes  . Interferon Beta-1b (BETASERON) 0.3 MG KIT injection Inject  subcutaneously 1 syringe every other day.  . losartan (COZAAR) 25 MG tablet Take 1 tablet by mouth daily. singh  . metFORMIN (GLUCOPHAGE) 500 MG tablet Take 1 tablet by mouth 2 (two) times daily.  Glory Rosebush ULTRA test strip TEST TID  . traMADol (ULTRAM) 50 MG tablet TAKE 1 TABLET(50 MG) BY MOUTH EVERY 6 HOURS AS NEEDED FOR PAIN  . [DISCONTINUED] cyanocobalamin (,VITAMIN B-12,) 1000 MCG/ML injection ADMINISTER 1 ML(1000 MCG) IN THE MUSCLE EVERY  14 DAYS  . [DISCONTINUED] ferrous sulfate (FEROSUL) 325 (65 FE) MG tablet TAKE 1 TABLET(325 MG) BY MOUTH THREE TIMES DAILY  . [DISCONTINUED] gemfibrozil (LOPID) 600 MG tablet TAKE 1 TABLET(600 MG) BY MOUTH TWICE DAILY BEFORE A MEAL  . [DISCONTINUED] loratadine (CLARITIN) 10 MG tablet Take 1 tablet (10 mg total) by mouth daily.  . [DISCONTINUED] omeprazole (PRILOSEC) 40 MG capsule One a day  . [DISCONTINUED] pregabalin (LYRICA) 200 MG capsule Take 1 capsule (200 mg total) by mouth 2 (two) times daily.  . [DISCONTINUED] sertraline (ZOLOFT) 50 MG tablet TAKE 1/2 TABLET(25 MG) BY MOUTH DAILY  . [DISCONTINUED] simvastatin (ZOCOR) 40 MG tablet Take 1 tablet (40 mg total) by mouth daily.    PHQ 2/9 Scores 12/25/2020 11/05/2020 09/11/2020 07/07/2020  PHQ - 2 Score 0 0 0 0  PHQ- 9 Score 0 - 0 2    GAD 7 : Generalized Anxiety Score 09/11/2020 07/07/2020 01/15/2020  Nervous, Anxious, on Edge 0 0 0  Control/stop worrying 0 0 0  Worry too much - different things 0 0 0  Trouble relaxing 0 0 0  Restless 0 0 0  Easily annoyed or irritable 0 0 0  Afraid - awful might happen 0 0 0  Total GAD 7 Score 0 0 0    BP Readings from Last 3 Encounters:  12/25/20 120/60  09/11/20 (!) 150/76  07/07/20 130/76    Physical Exam Vitals and nursing note reviewed.  Constitutional:      General: He is not irritable. HENT:     Head: Normocephalic.     Right Ear: Tympanic membrane and external ear normal.     Left Ear: Tympanic membrane and external ear normal.     Nose: Nose normal.     Mouth/Throat:     Mouth: Oropharynx is clear and moist. Mucous membranes are moist.  Eyes:     General: No scleral icterus.       Right eye: No discharge.        Left eye: No discharge.     Extraocular Movements: EOM normal.     Conjunctiva/sclera: Conjunctivae normal.     Pupils: Pupils are equal, round, and reactive to light.  Neck:     Thyroid: No thyromegaly.     Vascular: No JVD.     Trachea: No tracheal deviation.   Cardiovascular:     Rate and Rhythm: Normal rate and regular rhythm.     Pulses: Intact distal pulses.     Heart sounds: S1 normal and S2 normal. Heart sounds are distant. No murmur heard.  No systolic murmur is present.  No diastolic murmur is present. No friction rub. No gallop. No S3 or S4 sounds.   Pulmonary:     Effort: No respiratory distress.     Breath sounds: Normal breath sounds. No decreased breath sounds, wheezing, rhonchi or rales.  Abdominal:     General: Bowel sounds are normal.     Palpations: Abdomen is soft. There is no hepatosplenomegaly or mass.  Tenderness: There is no abdominal tenderness. There is no CVA tenderness, guarding or rebound.  Musculoskeletal:        General: No tenderness or edema. Normal range of motion.     Cervical back: Normal range of motion and neck supple.  Lymphadenopathy:     Cervical: No cervical adenopathy.  Skin:    General: Skin is warm.     Findings: No rash.  Neurological:     Mental Status: He is alert and oriented to person, place, and time.     Cranial Nerves: No cranial nerve deficit.     Deep Tendon Reflexes: Strength normal and reflexes are normal and symmetric.     Wt Readings from Last 3 Encounters:  12/25/20 216 lb (98 kg)  09/11/20 221 lb (100.2 kg)  07/07/20 223 lb (101.2 kg)    BP 120/60   Pulse 80   Ht 5' 10"  (1.778 m)   Wt 216 lb (98 kg)   SpO2 95%   BMI 30.99 kg/m   Assessment and Plan:  1. Other vitamin B12 deficiency anemia Chronic.  Controlled.  Stable.  Patient will continue on B12 injections 1000 mcg every 14 days. - cyanocobalamin (,VITAMIN B-12,) 1000 MCG/ML injection; ADMINISTER 1 ML(1000 MCG) IN THE MUSCLE EVERY 14 DAYS  Dispense: 2 mL; Refill: 0  2. Anemia, unspecified type Chronic.  Controlled.  Stable.  Continue ferrous sulfate 325 check CBC for evaluation of current hemoglobin level. - CBC w/Diff/Platelet - ferrous sulfate (FEROSUL) 325 (65 FE) MG tablet; TAKE 1 TABLET(325 MG) BY  MOUTH THREE TIMES DAILY  Dispense: 270 tablet; Refill: 1  3. Mixed hyperlipidemia .  Controlled.  Stable.  Continue lipid control gemfibrozil 600 mg twice a day and simvastatin 40 mg once a day.  Check lipid panel for evaluation. - Lipid Panel With LDL/HDL Ratio - gemfibrozil (LOPID) 600 MG tablet; TAKE 1 TABLET(600 MG) BY MOUTH TWICE DAILY BEFORE A MEAL  Dispense: 180 tablet; Refill: 1 - simvastatin (ZOCOR) 40 MG tablet; Take 1 tablet (40 mg total) by mouth daily.  Dispense: 90 tablet; Refill: 1  4. Gastroesophageal reflux disease without esophagitis .  Controlled.  Stable.  Continue omeprazole 40 mg once a day. - omeprazole (PRILOSEC) 40 MG capsule; One a day  Dispense: 90 capsule; Refill: 1  5. 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  Controlled.  Stable.  Continue Lyrica 200 mg 1 capsule twice a day.  Will check CMP. - Comprehensive Metabolic Panel (CMET) - pregabalin (LYRICA) 200 MG capsule; Take 1 capsule (200 mg total) by mouth 2 (two) times daily.  Dispense: 60 capsule; Refill: 5  6. Idiopathic peripheral neuropathy As noted above we will continue Lyrica 200 mg 1 capsule twice a day. - Comprehensive Metabolic Panel (CMET) - pregabalin (LYRICA) 200 MG capsule; Take 1 capsule (200 mg total) by mouth 2 (two) times daily.  Dispense: 60 capsule; Refill: 5  7. 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  Controlled.  Stable.  PHQ is 0.  Continue sertraline 50 mg 1/2 tablet daily. - sertraline (ZOLOFT) 50 MG tablet; TAKE 1/2 TABLET(25 MG) BY MOUTH DAILY  Dispense: 45 tablet; Refill: 1  8. Post-COVID chronic dyspnea New onset patient had COVID earlier in the year in January and thereafter has had persistent shortness of breath with activity.  There is no chest pain and no orthopnea/PND.  We will obtain a chest x-ray to see if there is any residual pulmonary concern and will refer to pulmonary  because of the likelihood of post Covid  respiratory repercussions. - DG Chest 2 View; Future - Ambulatory referral to Pulmonology

## 2020-12-26 LAB — COMPREHENSIVE METABOLIC PANEL
ALT: 10 IU/L (ref 0–44)
AST: 18 IU/L (ref 0–40)
Albumin/Globulin Ratio: 1.5 (ref 1.2–2.2)
Albumin: 4.1 g/dL (ref 3.8–4.8)
Alkaline Phosphatase: 68 IU/L (ref 44–121)
BUN/Creatinine Ratio: 24 (ref 10–24)
BUN: 32 mg/dL — ABNORMAL HIGH (ref 8–27)
Bilirubin Total: <0.2 mg/dL (ref 0.0–1.2)
CO2: 17 mmol/L — ABNORMAL LOW (ref 20–29)
Calcium: 9.1 mg/dL (ref 8.6–10.2)
Chloride: 106 mmol/L (ref 96–106)
Creatinine, Ser: 1.31 mg/dL — ABNORMAL HIGH (ref 0.76–1.27)
Globulin, Total: 2.8 g/dL (ref 1.5–4.5)
Glucose: 108 mg/dL — ABNORMAL HIGH (ref 65–99)
Potassium: 4.9 mmol/L (ref 3.5–5.2)
Sodium: 141 mmol/L (ref 134–144)
Total Protein: 6.9 g/dL (ref 6.0–8.5)
eGFR: 59 mL/min/{1.73_m2} — ABNORMAL LOW (ref >59)

## 2020-12-26 LAB — LIPID PANEL WITH LDL/HDL RATIO
Cholesterol, Total: 109 mg/dL (ref 100–199)
HDL: 26 mg/dL — ABNORMAL LOW (ref >39)
LDL Chol Calc (NIH): 50 mg/dL (ref 0–99)
LDL/HDL Ratio: 1.9 ratio (ref 0.0–3.6)
Triglycerides: 204 mg/dL — ABNORMAL HIGH (ref 0–149)
VLDL Cholesterol Cal: 33 mg/dL (ref 5–40)

## 2020-12-26 LAB — CBC WITH DIFFERENTIAL/PLATELET
Basophils Absolute: 0.1 10*3/uL (ref 0.0–0.2)
Basos: 1 %
EOS (ABSOLUTE): 0.2 10*3/uL (ref 0.0–0.4)
Eos: 5 %
Hematocrit: 29.5 % — ABNORMAL LOW (ref 37.5–51.0)
Hemoglobin: 9.8 g/dL — ABNORMAL LOW (ref 13.0–17.7)
Immature Grans (Abs): 0 10*3/uL (ref 0.0–0.1)
Immature Granulocytes: 0 %
Lymphocytes Absolute: 0.8 10*3/uL (ref 0.7–3.1)
Lymphs: 15 %
MCH: 32.3 pg (ref 26.6–33.0)
MCHC: 33.2 g/dL (ref 31.5–35.7)
MCV: 97 fL (ref 79–97)
Monocytes Absolute: 0.6 10*3/uL (ref 0.1–0.9)
Monocytes: 12 %
Neutrophils Absolute: 3.4 10*3/uL (ref 1.4–7.0)
Neutrophils: 67 %
Platelets: 173 10*3/uL (ref 150–450)
RBC: 3.03 x10E6/uL — ABNORMAL LOW (ref 4.14–5.80)
RDW: 13.5 % (ref 11.6–15.4)
WBC: 5 10*3/uL (ref 3.4–10.8)

## 2020-12-30 ENCOUNTER — Telehealth: Payer: Self-pay

## 2020-12-30 NOTE — Telephone Encounter (Signed)
Copied from [place] [phone]. Topic: General - Other >> Dec 30, 2020  3:46 PM Alanda Slim E wrote: Reason for CRM: Pt called to speak with Baxter Flattery about being scheduled with the pulmonologist/ Pt states he was advised to call if he hasnt heard anything by today/ please advise

## 2020-12-30 NOTE — Telephone Encounter (Signed)
Spoke to pt and called pulmonology- faxed notes atten Jeremy [name] and [facility] refaxed in proficient

## 2021-01-12 ENCOUNTER — Telehealth: Payer: Self-pay

## 2021-01-12 NOTE — Telephone Encounter (Unsigned)
Copied from [place] [id number]. Topic: General - Call Back - No Documentation >> Jan 12, 2021 11:52 AM Erick Blinks wrote: Reason for CRM: [phone]  Pt's wife called and is requesting a call back from Baxter Flattery, she says that pulmonology is missing a referral from the office

## 2021-01-12 NOTE — Telephone Encounter (Signed)
Pt going to pulmonology Wednesday March 23 @ 1:00

## 2021-01-14 DIAGNOSIS — U071 COVID-19: Secondary | ICD-10-CM | POA: Diagnosis not present

## 2021-01-14 DIAGNOSIS — R0600 Dyspnea, unspecified: Secondary | ICD-10-CM | POA: Diagnosis not present

## 2021-01-14 DIAGNOSIS — I272 Pulmonary hypertension, unspecified: Secondary | ICD-10-CM | POA: Diagnosis not present

## 2021-01-14 DIAGNOSIS — Z8616 Personal history of COVID-19: Secondary | ICD-10-CM | POA: Diagnosis not present

## 2021-01-28 DIAGNOSIS — R0600 Dyspnea, unspecified: Secondary | ICD-10-CM | POA: Diagnosis not present

## 2021-01-28 DIAGNOSIS — R0609 Other forms of dyspnea: Secondary | ICD-10-CM | POA: Diagnosis not present

## 2021-01-28 DIAGNOSIS — I272 Pulmonary hypertension, unspecified: Secondary | ICD-10-CM | POA: Diagnosis not present

## 2021-01-28 DIAGNOSIS — Z8616 Personal history of COVID-19: Secondary | ICD-10-CM | POA: Diagnosis not present

## 2021-02-02 DIAGNOSIS — R0600 Dyspnea, unspecified: Secondary | ICD-10-CM | POA: Diagnosis not present

## 2021-02-16 ENCOUNTER — Other Ambulatory Visit: Payer: Self-pay | Admitting: Family Medicine

## 2021-02-16 DIAGNOSIS — D518 Other vitamin B12 deficiency anemias: Secondary | ICD-10-CM

## 2021-02-16 MED ORDER — CYANOCOBALAMIN 1000 MCG/ML IJ SOLN: INTRAMUSCULAR | 1 refills | Status: DC

## 2021-02-16 NOTE — Telephone Encounter (Signed)
Requested medication (s) are due for refill today:   Provider to review  Requested medication (s) are on the active medication list:   Yes  Future visit scheduled:   Yes   Last ordered: 12/25/2020 2 ml, 0 refills  No protocol assigned to this medication    Requested Prescriptions  Pending Prescriptions Disp Refills   cyanocobalamin (,VITAMIN B-12,) 1000 MCG/ML injection 2 mL 0    Sig: ADMINISTER 1 ML(1000 MCG) IN THE MUSCLE EVERY 14 DAYS      Off-Protocol Failed - 02/16/2021 10:42 AM      Failed - Medication not assigned to a protocol, review manually.      Passed - Valid encounter within last 12 months    Recent Outpatient Visits           1 month ago Post-COVID chronic dyspnea   [facility], [facility]   5 months ago Acute maxillary sinusitis, recurrence not specified   [facility] Juline Patch, MD   7 months ago Moderate episode of recurrent major depressive disorder [facility])   [facility] Jul[facility], [facility]   1 year ago Mixed hyperlipidemia   [facility] Jul[facility], [facility]   1 year ago Taking medication for chronic disease   [facility] Juline [facility], MD       Future Appointments             In 4 months Juline Patch, MD [facility], [facility]            Off-Protocol Failed - 02/16/2021 10:42 AM      Failed - Medication not assigned to a protocol, review manually.      Passed - Valid encounter within last 12 months    Recent Outpatient Visits           1 month ago Post-COVID chronic dyspnea   [facility], [facility]   5 months ago Acute maxillary sinusitis, recurrence not specified   [facility], [facility]   7 months ago Moderate episode of recurrent major depressive disorder [facility])   [facility] Jul[facility], [facility]   1 year ago Mixed hyperlipidemia   [facility] Juline [facility], MD   1 year ago Taking medication for chronic  disease   [facility] Juline Patch, MD       Future Appointments             In 4 months Juline Patch, MD [facility], [facility]

## 2021-02-16 NOTE — Telephone Encounter (Signed)
Copied from [place] [id number]. Topic: Quick Communication - Rx Refill/Question >> Feb 16, 2021 10:35 AM Yvette Rack wrote: Medication: cyanocobalamin (,VITAMIN B-12,) 1000 MCG/ML injection  Has the patient contacted their pharmacy? yes - Pt advised that request was sent but no response  Preferred Pharmacy (with phone number or street name): [facility] #[id number], [place] [address] AT [address] Phone: [phone]   Fax: [phone]  Agent: Please be advised that RX refills may take up to 3 business days. We ask that you follow-up with your pharmacy.

## 2021-03-10 DIAGNOSIS — E119 Type 2 diabetes mellitus without complications: Secondary | ICD-10-CM | POA: Diagnosis not present

## 2021-03-10 LAB — HM DIABETES EYE EXAM

## 2021-03-30 DIAGNOSIS — Z794 Long term (current) use of insulin: Secondary | ICD-10-CM | POA: Diagnosis not present

## 2021-03-30 DIAGNOSIS — E785 Hyperlipidemia, unspecified: Secondary | ICD-10-CM | POA: Diagnosis not present

## 2021-03-30 DIAGNOSIS — E1142 Type 2 diabetes mellitus with diabetic polyneuropathy: Secondary | ICD-10-CM | POA: Diagnosis not present

## 2021-03-30 DIAGNOSIS — E1169 Type 2 diabetes mellitus with other specified complication: Secondary | ICD-10-CM | POA: Diagnosis not present

## 2021-03-30 LAB — BASIC METABOLIC PANEL: Glucose: 94

## 2021-03-30 LAB — HEMOGLOBIN A1C: Hemoglobin A1C: 4.9

## 2021-03-30 LAB — TSH: TSH: 1.9 (ref 0.41–5.90)

## 2021-04-13 ENCOUNTER — Other Ambulatory Visit: Payer: Self-pay | Admitting: Family Medicine

## 2021-04-13 DIAGNOSIS — D518 Other vitamin B12 deficiency anemias: Secondary | ICD-10-CM

## 2021-04-13 NOTE — Telephone Encounter (Signed)
   Notes to clinic: Patient requesting a 90 day supply    Requested Prescriptions  Pending Prescriptions Disp Refills   cyanocobalamin (,VITAMIN B-12,) 1000 MCG/ML injection [Pharmacy Med Name: CYANOCOBALAMIN 1000MCG/ML INJ, 1ML] 6 mL     Sig: ADMINISTER 1 ML(1000 MCG) IN THE MUSCLE EVERY 14 DAYS      Off-Protocol Failed - 04/13/2021 10:48 AM      Failed - Medication not assigned to a protocol, review manually.      Passed - Valid encounter within last 12 months    Recent Outpatient Visits           3 months ago Post-COVID chronic dyspnea   [facility], [facility]   7 months ago Acute maxillary sinusitis, recurrence not specified   [facility], [facility]   9 months ago Moderate episode of recurrent major depressive disorder [facility])   [facility], [facility]   1 year ago Mixed hyperlipidemia   [facility] Jul[facility], [facility]   1 year ago Taking medication for chronic disease   [facility] Juline Patch, MD       Future Appointments             In 2 months Juline Patch, MD [facility], [facility]            Off-Protocol Failed - 04/13/2021 10:48 AM      Failed - Medication not assigned to a protocol, review manually.      Passed - Valid encounter within last 12 months    Recent Outpatient Visits           3 months ago Post-COVID chronic dyspnea   [facility], [facility]   7 months ago Acute maxillary sinusitis, recurrence not specified   [facility], [facility]   9 months ago Moderate episode of recurrent major depressive disorder [facility])   [facility], MD   1 year ago Mixed hyperlipidemia   [facility] Juline Patch, MD   1 year ago Taking medication for chronic disease   [facility] Juline Patch, MD       Future Appointments             In 2 months Juline Patch, MD [facility],  [facility]

## 2021-05-07 ENCOUNTER — Encounter: Payer: Self-pay | Admitting: Neurology

## 2021-05-07 ENCOUNTER — Ambulatory Visit (INDEPENDENT_AMBULATORY_CARE_PROVIDER_SITE_OTHER): Payer: Medicare Other | Admitting: Neurology

## 2021-05-07 VITALS — BP 135/60 | HR 72 | Ht 70.0 in | Wt 217.0 lb

## 2021-05-07 DIAGNOSIS — R269 Unspecified abnormalities of gait and mobility: Secondary | ICD-10-CM

## 2021-05-07 DIAGNOSIS — G35 Multiple sclerosis: Secondary | ICD-10-CM

## 2021-05-07 NOTE — Progress Notes (Signed)
ASSESSMENT AND PLAN Relapsing Remitting Multiple sclerosis Gait abnormality Diabetic peripheral neuropathy  Most recent MRI was in 2018, showed multiple subcortical, periventricular nonenhancing lesion, no change compared to previous scan in 2015, no contrast-enhancement  To have several discussion over the past few years, he desires to stay on Betaseron, no significant side effect, he reported worsening gait abnormality after he stopping Betaseron for 3 weeks in summer 2020, he does not want to consider switch to a different agent  Tramadol as needed for pain  Encourage him moderate exercise  DIAGNOSTIC DATA (LABS, IMAGING, TESTING) - I reviewed patient records, labs, notes, testing and imaging myself where available.  Dec 2018:  MRI scan of brain without contrast showing multiple subcortical and periventricular nonenhancing lesions consistent with chronic demyelinating disease. No enhancing lesions are noted. Overall no significant change c/w MRI 08/15/2014  MRI cervical spine in Oct 2015 showing mild spondylitic changes descibed above most prominent at C 3-4 where there is moderate left formaminal narrowing. Ill defined spinal cord hypertintensities at C 3-4 to c 6-7 likely represent chronic multiple sclerosis inactive plaques. No  significant change compared with MRI C spine 06/02/2012  Laboratory evaluation in 2022: A1c 4.9, normal TSH 1.95, CMP showed elevated creatinine 1.3, lipid panel, triglyceride 204, LDL 50, CBC showed hemoglobin of 9.8  HISTORY OF PRESENT ILLNESS:HISTORY: He has PMHx of  diabetes, hypertension and multiple sclerosis.  The diagnosis of multiple sclerosis was confirmed by abnormal MRI brain and cervical, and a spinal fluid testing.  He was initially followed by Dr. Estella Husk in 2008. Last seen in this office Oct 2014, He continues to have gait difficulties and relies on a cane for ambulation.  He has had no problems with his vision or bowel/bladder control.   He  has had occasional falls.He has ongoing pain in his legs. Last MS flare was over 2 years ago. He is currently on Betaseron every other day, treatment started in 2007.    MRI cervical in 2013 with findings consistent with MS plaques and no enhancement,mild spinal stenosis.   MRI of the brain with prominent white matter changes consistent with MS. No active areas of demyelination. He continues to have problems with low     The initial symptom was acute onset of left leg more than arm weakness, gait difficulty  in 2006.  This eventually led to the diagnosis of relapsing remitting multiple sclerosis. He has been receiving Betaseron treatment since early 2007.  He has tolerated the medication well.  He has had no problems with lipoatrophy or skin necrosis. In September 2009, he developed GI bleeding.  Workup had demonstrated colon inflammation and obstruction.He underwent colectomy and recovered well from that procedure.   He continues to have gait difficulties and relies on a cane for ambulation.  He has had no problems with his vision or bowel/bladder control. He does have chronic extremity pain, which has been managed with Lyrica and Tramadol.    He uses an Transport planner for situations in which he would need to walk long distances. He has had occasional falls.He has ongoing pain in his legs   UPDATE 07/2014: He was admitted to hospital in Nov 2014 for anemia, low blood count, was evaluated by hematologist in [facility], now has recovered, he still has moderate gait difficulty.also complains bilateral lower extremity spasticity, is taking Lyrica, there was no clinical flareups, last MRI was in 2013, there was significant lesions in brain, and cervical spine   UPDATE 09/03/2014: He  has tried Baclofen 55m qhs, complains of excessive sleepiness, could not tolerate it, he still mows the yard, the most limitation is his gait difficulty,he denies bowel and bladder incontinence.   We have reviewed MRI  togather, MRI scan of brain showing periventricular and subcortical white matter hyperintensities consistent with multiple sclerosis. Presence of atrophy of corpus callosum indicates chronic disease. No significant change compared with MRI 06/02/2012    MRI cervical spine showing mild spondylitic changes descibed above most prominent at C 3-4 where there is moderate left formaminal narrowing. Ill defined spinal cord hypertintensities at C 3-4 to c 6-7 likely represent chronic multiple sclerosis inactive plaques. No significant change compared with MRI C spine 06/02/2012   UPDATE 11//17/16 [name] 70year old male returns for follow-up. He was last seen in this office 03/06/15.  He was diagnosed with multiple sclerosis in 2006. He is currently on Betaseron every other day without injection site issues.. He still remains fairly active mowing the yard, denies any bowel or bladder incontinence. He denies any sensory changes,  double vision speech or swallowing difficulty. He ambulates with a cane. He has had a couple of falls since last seen. He returns for reevaluation   UPDATE May 17th 2017:YY He is with his wife at today's clinical visit, he continued to receive Betaseron every other day without significant side effect, he has significant gait difficulty, left side is weaker, complains of chronic fatigue, chronic low back pain, bilateral lower extremity neuropathic pain, taking Lyrica, tramadol as needed,  Reviewed laboratory evaluation, A1c 7.9, CMP showed elevated creatinine 1.4, normal CBC  We again personally reviewed MRI of the scan without contrast, most recent was in 2015, MRI of the brain showed multiple supratentorium lesions, no contrast enhancement, MRI of cervical spine showed ill-defined C3-C7 cord lesion, no contrast enhancement, no significant change compared to previous scans   Update September 22, 2017:YY Creatinine 1.27 in July 2018, he is overall doing very well, mild gait abnormality, use  cane, still active at home, providing normal, no bowel bladder incontinence, diabetes, diabetic peripheral neuropathy, using Betaseron, last MRI was in 2015,  UPDATE May 01 2019: He is overall stable, continue ambulate with a cane, slow worsening gait abnormality, heat intolerance, I personally reviewed MRI of the brain with without contrast in December 2018: Multiple subcortical, periventricular nonenhancing lesion, no significant change compared to previous scan in October 2015  Laboratory evaluations in September 2019: Hg 12.4,  LDL 84, A1C 5.7  UPDATE May 06 2020: He is accompanied by his wife at today's clinic visit, he is overall stable, only slow as expected decline with aging, rely on his cane more, denies bowel and bladder incontinence, he tolerated Betaseron well, does not want to change  We reviewed MRI of the brain with without contrast together, most recently in December 2018, multiple supratentorial MS lesions, no change compared to previous scan in October 2015, T1 black holes, no contrast-enhancement  Laboratory evaluation in March 2021: CBC showed anemia hemoglobin of 10.9, CMP abnormal creatinine 1.37, lipid panel, LDL 68  UPDATE May 07 2021: He is accompanied by his wife at today's visit, suffered COVID in January 2022, severely symptomatic, following that, he has worsening shortness of breath, previous smoker, was seen by pulmonologist, suspected pulmonary hypertension, oxygen desaturation, questionable residual effects from COVID, he was started on portable oxygen at 2 L, gave inhaler  Patient complains of worsening functional status since then, generalized fatigue, lack of stamina, still to ambulate with walker   Laboratory  evaluation in June 2022, A1c 4.9, TSH 1.95, CMP creat 1.31, LDL 69, Hg. 9.8   PHYSICAL EXAM  Vitals:   05/07/21 1316  BP: 135/60  Pulse: 72  Weight: 217 lb (98.4 kg)  Height: 5' 10"  (1.778 m)   Body mass index is 31.14 kg/m.  Generalized:  Well developed, obese male in no acute distress  Head: normocephalic and atraumatic,. Oropharynx benign  Neck: Supple,  Musculoskeletal: Mild left hemiparesis  Neurological examination   Mentation: Alert oriented to time, place, history taking and casual conversation Cranial nerve II-XII:  Pupils were equal round reactive to light extraocular movements were full, visual field were full on confrontational test. Facial sensation and strength were normal. hearing was intact to finger rubbing bilaterally. Uvula tongue midline. head turning and shoulder shrug were normal and symmetric. Motor: No significant upper or lower extremity proximal and distal muscle weakness, mild to moderate bilateral lower extremity spasticity Sensory: Length dependent decreased  light touch, pinprick, and  Vibration,  to mid shin  Coordination: finger-nose-finger, heel-to-shin bilaterally, no dysmetria Reflexes: Hyperreflexia of both lower extremities, plantar responses were flexor bilaterally. Gait and Station:Need to push up to get up from seated position wide based,  stiff, unsteady gait ambulates with single-point cane, with bilateral knee flexion    REVIEW OF SYSTEMS: Full 14 system review of systems performed and notable only for those listed, all others are neg:  As above  ALLERGIES: Allergies  Allergen Reactions   Betadine [Povidone Iodine]     HOME MEDICATIONS: Outpatient Medications Prior to Visit  Medication Sig Dispense Refill   albuterol (VENTOLIN) (5 MG/ML) 0.5% NEBU Take by nebulization continuous.     aspirin 81 MG tablet Take 81 mg by mouth daily.     B-D INSULIN SYRINGE 1CC/25GX1" 25G X 1" 1 ML MISC USE AS DIRECTED 10 each 11   BD PEN NEEDLE NANO U/F 32G X 4 MM MISC      cyanocobalamin (,VITAMIN B-12,) 1000 MCG/ML injection ADMINISTER 1 ML(1000 MCG) IN THE MUSCLE EVERY 14 DAYS 6 mL 0   ferrous sulfate (FEROSUL) 325 (65 FE) MG tablet TAKE 1 TABLET(325 MG) BY MOUTH THREE TIMES DAILY 270 tablet  1   furosemide (LASIX) 40 MG tablet Take 1 tablet (40 mg total) by mouth every other day. (Patient taking differently: Take 20 mg by mouth every other day.) 45 tablet 1   gemfibrozil (LOPID) 600 MG tablet TAKE 1 TABLET(600 MG) BY MOUTH TWICE DAILY BEFORE A MEAL 180 tablet 1   Insulin Glargine (LANTUS) 100 UNIT/ML Solostar Pen Inject 32 Units into the skin daily. Dr Honor Junes     Interferon Beta-1b (BETASERON) 0.3 MG KIT injection Inject subcutaneously 1 syringe every other day. 45 kit 4   loratadine (CLARITIN) 10 MG tablet Take 1 tablet (10 mg total) by mouth daily. 90 tablet 1   metFORMIN (GLUCOPHAGE) 500 MG tablet Take 1 tablet by mouth 2 (two) times daily.     omeprazole (PRILOSEC) 40 MG capsule One a day 90 capsule 1   ONETOUCH ULTRA test strip TEST TID     pregabalin (LYRICA) 200 MG capsule Take 1 capsule (200 mg total) by mouth 2 (two) times daily. 60 capsule 5   sertraline (ZOLOFT) 50 MG tablet TAKE 1/2 TABLET(25 MG) BY MOUTH DAILY 45 tablet 1   simvastatin (ZOCOR) 40 MG tablet Take 1 tablet (40 mg total) by mouth daily. 90 tablet 1   traMADol (ULTRAM) 50 MG tablet TAKE 1 TABLET(50 MG) BY MOUTH EVERY  6 HOURS AS NEEDED FOR PAIN 120 tablet 5   losartan (COZAAR) 25 MG tablet Take 1 tablet by mouth daily. singh     No facility-administered medications prior to visit.    PAST MEDICAL HISTORY: Past Medical History:  Diagnosis Date   Chronic pain    Depression    Diabetes (HCC)    GERD (gastroesophageal reflux disease)    Hyperlipemia    Hypertension    MS (multiple sclerosis) ([place])     PAST SURGICAL HISTORY: Past Surgical History:  Procedure Laterality Date   COLECTOMY  06-2008   COLONOSCOPY  2015   normal    FAMILY HISTORY: Family History  Problem Relation Age of Onset   Lung cancer Mother    Heart attack Father    Heart attack Brother    COPD Brother     SOCIAL HISTORY: Social History   Socioeconomic History   Marital status: Married    Spouse name: [name]    Number of children: 2   Years of education: GED   Highest education level: Not on file  Occupational History    Comment: Disabled  Tobacco Use   Smoking status: Former    Packs/day: 1.50    Years: 30.00    Pack years: 45.00    Types: Cigarettes    Quit date: 01/23/2006    Years since quitting: 15.2   Smokeless tobacco: Never   Tobacco comments:    N/A  Vaping Use   Vaping Use: Never used  Substance and Sexual Activity   Alcohol use: Yes    Alcohol/week: 0.0 standard drinks    Comment: rare; maybe 2 beers a year   Drug use: No   Sexual activity: Not Currently  Other Topics Concern   Not on file  Social History Narrative   Patient is disabled.    Patient lives with his wife Ami Thornsberry.    Patient has 2 children.       Social Determinants of Health   Financial Resource Strain: Low Risk    Difficulty of Paying Living Expenses: Not hard at all  Food Insecurity: No Food Insecurity   Worried About Charity fundraiser in the Last Year: Never true   [place] in the Last Year: Never true  Transportation Needs: No Transportation Needs   Lack of Transportation (Medical): No   Lack of Transportation (Non-Medical): No  Physical Activity: Inactive   Days of Exercise per Week: 0 days   Minutes of Exercise per Session: 0 min  Stress: No Stress Concern Present   Feeling of Stress : Not at all  Social Connections: Moderately Isolated   Frequency of Communication with Friends and Family: More than three times a week   Frequency of Social Gatherings with Friends and Family: Once a week   Attends Religious Services: Never   Marine scientist or Organizations: No   Attends Music therapist: Never   Marital Status: Married  Human resources officer Violence: Not At Risk   Fear of Current or Ex-Partner: No   Emotionally Abused: No   Physically Abused: No   Sexually Abused: No      Marcial Pacas, M.D. Ph.D.  [facility] [place], [place] [address] Phone: [phone] Fax:      [phone]

## 2021-05-22 ENCOUNTER — Other Ambulatory Visit: Payer: Self-pay | Admitting: Family Medicine

## 2021-05-22 DIAGNOSIS — F331 Major depressive disorder, recurrent, moderate: Secondary | ICD-10-CM

## 2021-05-25 DIAGNOSIS — Z9981 Dependence on supplemental oxygen: Secondary | ICD-10-CM | POA: Diagnosis not present

## 2021-05-25 DIAGNOSIS — R0600 Dyspnea, unspecified: Secondary | ICD-10-CM | POA: Diagnosis not present

## 2021-05-25 DIAGNOSIS — R0609 Other forms of dyspnea: Secondary | ICD-10-CM | POA: Diagnosis not present

## 2021-05-25 DIAGNOSIS — J439 Emphysema, unspecified: Secondary | ICD-10-CM | POA: Diagnosis not present

## 2021-05-25 DIAGNOSIS — G35 Multiple sclerosis: Secondary | ICD-10-CM | POA: Diagnosis not present

## 2021-05-25 DIAGNOSIS — R911 Solitary pulmonary nodule: Secondary | ICD-10-CM | POA: Diagnosis not present

## 2021-06-01 DIAGNOSIS — N182 Chronic kidney disease, stage 2 (mild): Secondary | ICD-10-CM | POA: Diagnosis not present

## 2021-06-01 LAB — COMPREHENSIVE METABOLIC PANEL
Albumin: 3.9 (ref 3.5–5.0)
Calcium: 9.3 (ref 8.7–10.7)

## 2021-06-01 LAB — BASIC METABOLIC PANEL
BUN: 39 — AB (ref 4–21)
CO2: 17 (ref 13–22)
Chloride: 106 (ref 99–108)
Creatinine: 1.3 (ref 0.6–1.3)
Potassium: 5 (ref 3.4–5.3)
Sodium: 136 — AB (ref 137–147)

## 2021-06-10 DIAGNOSIS — N1831 Chronic kidney disease, stage 3a: Secondary | ICD-10-CM | POA: Diagnosis not present

## 2021-06-10 DIAGNOSIS — I10 Essential (primary) hypertension: Secondary | ICD-10-CM | POA: Diagnosis not present

## 2021-06-10 DIAGNOSIS — E1129 Type 2 diabetes mellitus with other diabetic kidney complication: Secondary | ICD-10-CM | POA: Diagnosis not present

## 2021-06-10 DIAGNOSIS — D649 Anemia, unspecified: Secondary | ICD-10-CM | POA: Diagnosis not present

## 2021-06-18 ENCOUNTER — Other Ambulatory Visit: Payer: Self-pay

## 2021-06-18 ENCOUNTER — Inpatient Hospital Stay: Payer: Medicare Other | Attending: Oncology | Admitting: Oncology

## 2021-06-18 ENCOUNTER — Other Ambulatory Visit: Payer: Medicare Other

## 2021-06-18 ENCOUNTER — Encounter: Payer: Self-pay | Admitting: Oncology

## 2021-06-18 VITALS — BP 157/66 | HR 83 | Temp 97.6°F | Resp 20 | Wt 214.1 lb

## 2021-06-18 DIAGNOSIS — Z79899 Other long term (current) drug therapy: Secondary | ICD-10-CM | POA: Diagnosis not present

## 2021-06-18 DIAGNOSIS — Z801 Family history of malignant neoplasm of trachea, bronchus and lung: Secondary | ICD-10-CM | POA: Insufficient documentation

## 2021-06-18 DIAGNOSIS — Z87891 Personal history of nicotine dependence: Secondary | ICD-10-CM | POA: Diagnosis not present

## 2021-06-18 DIAGNOSIS — I129 Hypertensive chronic kidney disease with stage 1 through stage 4 chronic kidney disease, or unspecified chronic kidney disease: Secondary | ICD-10-CM | POA: Diagnosis not present

## 2021-06-18 DIAGNOSIS — Z833 Family history of diabetes mellitus: Secondary | ICD-10-CM | POA: Diagnosis not present

## 2021-06-18 DIAGNOSIS — N1831 Chronic kidney disease, stage 3a: Secondary | ICD-10-CM | POA: Diagnosis not present

## 2021-06-18 DIAGNOSIS — D509 Iron deficiency anemia, unspecified: Secondary | ICD-10-CM | POA: Diagnosis not present

## 2021-06-18 DIAGNOSIS — D631 Anemia in chronic kidney disease: Secondary | ICD-10-CM | POA: Diagnosis not present

## 2021-06-18 DIAGNOSIS — D649 Anemia, unspecified: Secondary | ICD-10-CM

## 2021-06-18 DIAGNOSIS — D500 Iron deficiency anemia secondary to blood loss (chronic): Secondary | ICD-10-CM

## 2021-06-18 DIAGNOSIS — E1122 Type 2 diabetes mellitus with diabetic chronic kidney disease: Secondary | ICD-10-CM | POA: Diagnosis not present

## 2021-06-18 DIAGNOSIS — Z8249 Family history of ischemic heart disease and other diseases of the circulatory system: Secondary | ICD-10-CM | POA: Insufficient documentation

## 2021-06-18 DIAGNOSIS — Z7982 Long term (current) use of aspirin: Secondary | ICD-10-CM | POA: Insufficient documentation

## 2021-06-18 DIAGNOSIS — Z794 Long term (current) use of insulin: Secondary | ICD-10-CM | POA: Insufficient documentation

## 2021-06-18 LAB — CBC WITH DIFFERENTIAL/PLATELET
Abs Immature Granulocytes: 0.01 10*3/uL (ref 0.00–0.07)
Basophils Absolute: 0 10*3/uL (ref 0.0–0.1)
Basophils Relative: 1 %
Eosinophils Absolute: 0.1 10*3/uL (ref 0.0–0.5)
Eosinophils Relative: 3 %
HCT: 25.7 % — ABNORMAL LOW (ref 39.0–52.0)
Hemoglobin: 8.2 g/dL — ABNORMAL LOW (ref 13.0–17.0)
Immature Granulocytes: 0 %
Lymphocytes Relative: 23 %
Lymphs Abs: 0.7 10*3/uL (ref 0.7–4.0)
MCH: 33.1 pg (ref 26.0–34.0)
MCHC: 31.9 g/dL (ref 30.0–36.0)
MCV: 103.6 fL — ABNORMAL HIGH (ref 80.0–100.0)
Monocytes Absolute: 0.4 10*3/uL (ref 0.1–1.0)
Monocytes Relative: 14 %
Neutro Abs: 1.8 10*3/uL (ref 1.7–7.7)
Neutrophils Relative %: 59 %
Platelets: 184 10*3/uL (ref 150–400)
RBC: 2.48 MIL/uL — ABNORMAL LOW (ref 4.22–5.81)
RDW: 15.9 % — ABNORMAL HIGH (ref 11.5–15.5)
WBC: 3.1 10*3/uL — ABNORMAL LOW (ref 4.0–10.5)
nRBC: 0 % (ref 0.0–0.2)

## 2021-06-18 LAB — IRON AND TIBC
Iron: 36 ug/dL — ABNORMAL LOW (ref 45–182)
Saturation Ratios: 7 % — ABNORMAL LOW (ref 17.9–39.5)
TIBC: 491 ug/dL — ABNORMAL HIGH (ref 250–450)
UIBC: 455 ug/dL

## 2021-06-18 LAB — TECHNOLOGIST SMEAR REVIEW

## 2021-06-18 LAB — FERRITIN: Ferritin: 32 ng/mL (ref 24–336)

## 2021-06-18 NOTE — Progress Notes (Signed)
Hematology/Oncology Consult note [facility] Telephone:([phone] [phone]   Patient Care Team: Juline Patch, MD as PCP - General (Family Medicine) Marcial Pacas, MD as Consulting Physician (Neurology) Samara Deist, DPM as Consulting Physician (Podiatry) Lonia Farber, MD as Consulting Physician (Internal Medicine) Magnus Sinning, MD as Consulting Physician (Nephrology)  REFERRING PROVIDER: Magnus Sinning, MD  CHIEF COMPLAINTS/REASON FOR VISIT:  Evaluation of anemia  HISTORY OF PRESENTING ILLNESS:   Jeremy [name] is a  70 y.o.  male with PMH listed below was seen in consultation at the request of  Magnus Sinning, MD  for evaluation of anemia.  Patient was accompanied by wife. He reports feeling weak and fatigued. Patient has chronic anemia, baseline was 10-11. 06/01/2021, he had a blood work done at nephrologist office.  Hemoglobin 9.2, Patient has chronic kidney disease.  History of GI bleeding after colonoscopy Chronic respiratory failure on nasal cannula oxygen due to COVID 19 infection.   Review of Systems  Constitutional:  Positive for fatigue. Negative for appetite change, chills and fever.  HENT:   Negative for hearing loss and voice change.   Eyes:  Negative for eye problems and icterus.  Respiratory:  Positive for shortness of breath. Negative for chest tightness and cough.   Cardiovascular:  Negative for chest pain and leg swelling.  Gastrointestinal:  Negative for abdominal distention and abdominal pain.  Endocrine: Negative for hot flashes.  Genitourinary:  Negative for difficulty urinating, dysuria and frequency.   Musculoskeletal:  Negative for arthralgias.  Skin:  Negative for itching and rash.  Neurological:  Negative for light-headedness and numbness.  Hematological:  Negative for adenopathy. Does not bruise/bleed easily.  Psychiatric/Behavioral:  Negative for confusion.    MEDICAL HISTORY:  Past  Medical History:  Diagnosis Date   Chronic pain    Depression    Diabetes (HCC)    GERD (gastroesophageal reflux disease)    Hyperlipemia    Hypertension    MS (multiple sclerosis) ([place])     SURGICAL HISTORY: Past Surgical History:  Procedure Laterality Date   COLECTOMY  06-2008   COLONOSCOPY  2015   normal    SOCIAL HISTORY: Social History   Socioeconomic History   Marital status: Married    Spouse name: [name]   Number of children: 2   Years of education: GED   Highest education level: Not on file  Occupational History    Comment: Disabled  Tobacco Use   Smoking status: Former    Packs/day: 1.50    Years: 30.00    Pack years: 45.00    Types: Cigarettes    Quit date: 01/23/2006    Years since quitting: 15.4   Smokeless tobacco: Never   Tobacco comments:    N/A  Vaping Use   Vaping Use: Never used  Substance and Sexual Activity   Alcohol use: Yes    Alcohol/week: 0.0 standard drinks    Comment: rare; maybe 2 beers a year   Drug use: No   Sexual activity: Not Currently  Other Topics Concern   Not on file  Social History Narrative   Patient is disabled.    Patient lives with his wife Rubens Cranston.    Patient has 2 children.       Social Determinants of Health   Financial Resource Strain: Low Risk    Difficulty of Paying Living Expenses: Not hard at all  Food Insecurity: No Food Insecurity   Worried About Charity fundraiser  in the Last Year: Never true   [place] in the Last Year: Never true  Transportation Needs: No Transportation Needs   Lack of Transportation (Medical): No   Lack of Transportation (Non-Medical): No  Physical Activity: Inactive   Days of Exercise per Week: 0 days   Minutes of Exercise per Session: 0 min  Stress: No Stress Concern Present   Feeling of Stress : Not at all  Social Connections: Moderately Isolated   Frequency of Communication with Friends and Family: More than three times a week   Frequency of Social Gatherings  with Friends and Family: Once a week   Attends Religious Services: Never   Marine scientist or Organizations: No   Attends Music therapist: Never   Marital Status: Married  Human resources officer Violence: Not At Risk   Fear of Current or Ex-Partner: No   Emotionally Abused: No   Physically Abused: No   Sexually Abused: No    FAMILY HISTORY: Family History  Problem Relation Age of Onset   Lung cancer Mother    Heart attack Father    Heart attack Brother    COPD Brother    Diabetes Brother    Esophageal cancer Nephew     ALLERGIES:  is allergic to betadine [povidone iodine].  MEDICATIONS:  Current Outpatient Medications  Medication Sig Dispense Refill   albuterol (VENTOLIN) (5 MG/ML) 0.5% NEBU Take by nebulization continuous.     aspirin 81 MG tablet Take 81 mg by mouth daily.     B-D INSULIN SYRINGE 1CC/25GX1" 25G X 1" 1 ML MISC USE AS DIRECTED 10 each 11   BD PEN NEEDLE NANO U/F 32G X 4 MM MISC      COMBIVENT RESPIMAT 20-100 MCG/ACT AERS respimat Inhale 1 puff into the lungs 4 (four) times daily.     cyanocobalamin (,VITAMIN B-12,) 1000 MCG/ML injection ADMINISTER 1 ML(1000 MCG) IN THE MUSCLE EVERY 14 DAYS 6 mL 0   ferrous sulfate (FEROSUL) 325 (65 FE) MG tablet TAKE 1 TABLET(325 MG) BY MOUTH THREE TIMES DAILY 270 tablet 1   furosemide (LASIX) 40 MG tablet Take 1 tablet (40 mg total) by mouth every other day. (Patient taking differently: Take 20 mg by mouth every other day.) 45 tablet 1   gemfibrozil (LOPID) 600 MG tablet TAKE 1 TABLET(600 MG) BY MOUTH TWICE DAILY BEFORE A MEAL 180 tablet 1   Insulin Glargine (LANTUS) 100 UNIT/ML Solostar Pen Inject 32 Units into the skin daily. Dr Honor Junes     Interferon Beta-1b (BETASERON) 0.3 MG KIT injection Inject subcutaneously 1 syringe every other day. 45 kit 4   loratadine (CLARITIN) 10 MG tablet Take 1 tablet (10 mg total) by mouth daily. 90 tablet 1   losartan (COZAAR) 25 MG tablet Take 1 tablet by mouth daily. singh      metFORMIN (GLUCOPHAGE) 500 MG tablet Take 1 tablet by mouth 2 (two) times daily.     omeprazole (PRILOSEC) 40 MG capsule One a day 90 capsule 1   ONETOUCH ULTRA test strip TEST TID     pregabalin (LYRICA) 200 MG capsule Take 1 capsule (200 mg total) by mouth 2 (two) times daily. 60 capsule 5   sertraline (ZOLOFT) 50 MG tablet TAKE 1/2 TABLET(25 MG) BY MOUTH DAILY 45 tablet 0   simvastatin (ZOCOR) 40 MG tablet Take 1 tablet (40 mg total) by mouth daily. 90 tablet 1   sodium bicarbonate 650 MG tablet Take 650 mg by mouth 2 (  two) times daily.     traMADol (ULTRAM) 50 MG tablet TAKE 1 TABLET(50 MG) BY MOUTH EVERY 6 HOURS AS NEEDED FOR PAIN 120 tablet 5   No current facility-administered medications for this visit.     PHYSICAL EXAMINATION: ECOG PERFORMANCE STATUS: 2 - Symptomatic, <50% confined to bed Vitals:   06/18/21 1505  BP: (!) 157/66  Pulse: 83  Resp: 20  Temp: 97.6 F (36.4 C)  SpO2: 97%   Filed Weights   06/18/21 1505  Weight: 214 lb 1.1 oz (97.1 kg)    Physical Exam Constitutional:      General: He is not in acute distress. HENT:     Head: Normocephalic and atraumatic.  Eyes:     General: No scleral icterus. Cardiovascular:     Rate and Rhythm: Normal rate and regular rhythm.     Heart sounds: Normal heart sounds.  Pulmonary:     Effort: Pulmonary effort is normal. No respiratory distress.     Breath sounds: No wheezing.     Comments: Decreased breath sound bilaterally.  He presents comfortably on nasal cannula oxygen. Abdominal:     General: Bowel sounds are normal. There is no distension.     Palpations: Abdomen is soft.  Musculoskeletal:        General: No deformity. Normal range of motion.     Cervical back: Normal range of motion and neck supple.  Skin:    General: Skin is warm and dry.     Findings: No erythema or rash.  Neurological:     Mental Status: He is alert and oriented to person, place, and time. Mental status is at baseline.     Cranial  Nerves: No cranial nerve deficit.     Coordination: Coordination normal.  Psychiatric:        Mood and Affect: Mood normal.    LABORATORY DATA:  I have reviewed the data as listed Lab Results  Component Value Date   WBC 3.1 (L) 06/18/2021   HGB 8.2 (L) 06/18/2021   HCT 25.7 (L) 06/18/2021   MCV 103.6 (H) 06/18/2021   PLT 184 06/18/2021   Recent Labs    07/07/20 0952 12/25/20 1058  NA  --  141  K  --  4.9  CL  --  106  CO2  --  17*  GLUCOSE  --  108*  BUN  --  32*  CREATININE  --  1.31*  CALCIUM  --  9.1  PROT 7.2 6.9  ALBUMIN 4.1 4.1  AST 17 18  ALT 10 10  ALKPHOS 66 68  BILITOT <0.2 <0.2  BILIDIR <0.10  --    Iron/TIBC/Ferritin/ %Sat    Component Value Date/Time   IRON 36 (L) 06/18/2021 1548   IRON 63 (L) 10/10/2013 1130   TIBC 491 (H) 06/18/2021 1548   TIBC 416 10/10/2013 1130   FERRITIN 32 06/18/2021 1548   FERRITIN 90 10/10/2013 1130   IRONPCTSAT 7 (L) 06/18/2021 1548   IRONPCTSAT 15 10/10/2013 1130      RADIOGRAPHIC STUDIES: I have personally reviewed the radiological images as listed and agreed with the findings in the report. No results found.    ASSESSMENT & PLAN:  1. Iron deficiency anemia due to chronic blood loss   2. Anemia in stage 3a chronic kidney disease ([place])    #Normocytic anemia, Discussed with the patient that anemia can be multifactorial, from iron deficiency anemia as well as anemia secondary to chronic kidney disease.  Recommend to  rule out myeloma and other vitamin deficiencies. Wife is concerned about patient gets too much blood draw at once.  Therefore we will do a limited panel for work-up of his anemia Check CBC, reticulocyte panel, iron, TIBC ferritin, SPEP.  We will finish the rest of the work-up in the future during follow-up appointments. We discussed about possibilities of IV Venofer treatments. Allergy reactions/infusion reaction including anaphylactic reaction discussed with patient. Other side effects include but  not limited to high blood pressure, skin rash, weight gain, leg swelling, etc. Patient voices understanding and willing to proceed if needed  #Labs reviewed and discussed with patient over the phone. Patient has iron deficiency.  In the context of chronic kidney disease induced anemia, I recommend to aggressively treat his underlying iron deficiency.  We will schedule patient to get IV Venofer 200 mg x 1 next week.  He will also do another dose when he comes back in 2 weeks for discussion of results.  Patient agrees with the plan Will arrange additional IV Venofer treatment appointments at the next visit.  Follow-up 2 weeks to go over lab results. Orders Placed This Encounter  Procedures   Technologist smear review    Standing Status:   Future    Number of Occurrences:   1    Standing Expiration Date:   06/18/2022   CBC with Differential/Platelet    Standing Status:   Future    Number of Occurrences:   1    Standing Expiration Date:   06/18/2022   Ferritin    Standing Status:   Future    Number of Occurrences:   1    Standing Expiration Date:   12/19/2021   Iron and TIBC    Standing Status:   Future    Number of Occurrences:   1    Standing Expiration Date:   06/18/2022   Protein electrophoresis, serum    Standing Status:   Future    Number of Occurrences:   1    Standing Expiration Date:   06/18/2022    All questions were answered. The patient knows to call the clinic with any problems questions or concerns.  cc Magnus Sinning, MD    Return of visit: 2 weeks Thank you for this kind referral and the opportunity to participate in the care of this patient. A copy of today's note is routed to referring provider    Earlie Server, MD, PhD Hematology Oncology [facility] at [facility] Pager- [phone] 06/18/2021

## 2021-06-18 NOTE — Progress Notes (Signed)
Pt here to establish care for anemia.

## 2021-06-19 ENCOUNTER — Encounter: Payer: Self-pay | Admitting: Oncology

## 2021-06-19 DIAGNOSIS — D631 Anemia in chronic kidney disease: Secondary | ICD-10-CM | POA: Insufficient documentation

## 2021-06-19 DIAGNOSIS — D649 Anemia, unspecified: Secondary | ICD-10-CM | POA: Insufficient documentation

## 2021-06-19 DIAGNOSIS — N1831 Chronic kidney disease, stage 3a: Secondary | ICD-10-CM | POA: Insufficient documentation

## 2021-06-19 DIAGNOSIS — D500 Iron deficiency anemia secondary to blood loss (chronic): Secondary | ICD-10-CM | POA: Insufficient documentation

## 2021-06-22 ENCOUNTER — Other Ambulatory Visit: Payer: Self-pay | Admitting: Family Medicine

## 2021-06-22 LAB — PROTEIN ELECTROPHORESIS, SERUM
A/G Ratio: 1.2 (ref 0.7–1.7)
Albumin ELP: 3.5 g/dL (ref 2.9–4.4)
Alpha-1-Globulin: 0.3 g/dL (ref 0.0–0.4)
Alpha-2-Globulin: 0.6 g/dL (ref 0.4–1.0)
Beta Globulin: 1.2 g/dL (ref 0.7–1.3)
Gamma Globulin: 0.8 g/dL (ref 0.4–1.8)
Globulin, Total: 2.9 g/dL (ref 2.2–3.9)
Total Protein ELP: 6.4 g/dL (ref 6.0–8.5)

## 2021-06-24 ENCOUNTER — Inpatient Hospital Stay: Payer: Medicare Other

## 2021-06-24 ENCOUNTER — Other Ambulatory Visit: Payer: Self-pay

## 2021-06-24 VITALS — BP 103/61 | HR 74 | Temp 98.1°F | Resp 20

## 2021-06-24 DIAGNOSIS — I129 Hypertensive chronic kidney disease with stage 1 through stage 4 chronic kidney disease, or unspecified chronic kidney disease: Secondary | ICD-10-CM | POA: Diagnosis not present

## 2021-06-24 DIAGNOSIS — D631 Anemia in chronic kidney disease: Secondary | ICD-10-CM

## 2021-06-24 DIAGNOSIS — N1831 Chronic kidney disease, stage 3a: Secondary | ICD-10-CM | POA: Diagnosis not present

## 2021-06-24 DIAGNOSIS — D500 Iron deficiency anemia secondary to blood loss (chronic): Secondary | ICD-10-CM

## 2021-06-24 DIAGNOSIS — Z87891 Personal history of nicotine dependence: Secondary | ICD-10-CM | POA: Diagnosis not present

## 2021-06-24 DIAGNOSIS — E1122 Type 2 diabetes mellitus with diabetic chronic kidney disease: Secondary | ICD-10-CM | POA: Diagnosis not present

## 2021-06-24 DIAGNOSIS — D509 Iron deficiency anemia, unspecified: Secondary | ICD-10-CM | POA: Diagnosis not present

## 2021-06-24 MED ORDER — SODIUM CHLORIDE 0.9 % IV SOLN
Freq: Once | INTRAVENOUS | Status: AC
  Filled 2021-06-24: qty 250

## 2021-06-24 MED ORDER — SODIUM CHLORIDE 0.9 % IV SOLN: 200.0000 mg | Freq: Once | INTRAVENOUS | Status: DC

## 2021-06-24 MED ORDER — IRON SUCROSE 20 MG/ML IV SOLN
200.0000 mg | Freq: Once | INTRAVENOUS | Status: AC
  Administered 2021-06-24: 200 mg via INTRAVENOUS
  Filled 2021-06-24: qty 10

## 2021-06-27 ENCOUNTER — Other Ambulatory Visit: Payer: Self-pay | Admitting: Family Medicine

## 2021-06-27 DIAGNOSIS — G609 Hereditary and idiopathic neuropathy, unspecified: Secondary | ICD-10-CM

## 2021-06-27 DIAGNOSIS — E349 Endocrine disorder, unspecified: Secondary | ICD-10-CM

## 2021-06-27 NOTE — Telephone Encounter (Signed)
Requested medication (s) are due for refill today: yes  Requested medication (s) are on the active medication list: yes  Last refill:  12/25/20  Future visit scheduled: yes  Notes to clinic:  med not delegated to NT to RF   Requested Prescriptions  Pending Prescriptions Disp Refills   pregabalin (LYRICA) 200 MG capsule [Pharmacy Med Name: PREGABALIN '200MG'$  CAPSULES] 60 capsule     Sig: TAKE ONE CAPSULE BY MOUTH TWICE DAILY     Not Delegated - Neurology:  Anticonvulsants - Controlled Failed - 06/27/2021 10:04 AM      Failed - This refill cannot be delegated      Passed - Valid encounter within last 12 months    Recent Outpatient Visits           6 months ago Post-COVID chronic dyspnea   [facility] Juline Patch, MD   9 months ago Acute maxillary sinusitis, recurrence not specified   [facility] Juline Patch, MD   11 months ago Moderate episode of recurrent major depressive disorder [facility])   [facility] Juline Patch, MD   1 year ago Mixed hyperlipidemia   [facility] Juline Patch, MD   1 year ago Taking medication for chronic disease   [facility] Juline Patch, MD       Future Appointments             In 3 days Juline Patch, MD [facility], [facility]

## 2021-06-30 ENCOUNTER — Encounter: Payer: Self-pay | Admitting: Family Medicine

## 2021-06-30 ENCOUNTER — Ambulatory Visit (INDEPENDENT_AMBULATORY_CARE_PROVIDER_SITE_OTHER): Payer: Medicare Other | Admitting: Family Medicine

## 2021-06-30 ENCOUNTER — Other Ambulatory Visit: Payer: Self-pay

## 2021-06-30 VITALS — BP 110/60 | HR 60 | Ht 70.0 in | Wt 213.0 lb

## 2021-06-30 DIAGNOSIS — K219 Gastro-esophageal reflux disease without esophagitis: Secondary | ICD-10-CM

## 2021-06-30 DIAGNOSIS — D518 Other vitamin B12 deficiency anemias: Secondary | ICD-10-CM

## 2021-06-30 DIAGNOSIS — G63 Polyneuropathy in diseases classified elsewhere: Secondary | ICD-10-CM

## 2021-06-30 DIAGNOSIS — E782 Mixed hyperlipidemia: Secondary | ICD-10-CM

## 2021-06-30 DIAGNOSIS — G609 Hereditary and idiopathic neuropathy, unspecified: Secondary | ICD-10-CM | POA: Diagnosis not present

## 2021-06-30 DIAGNOSIS — Z23 Encounter for immunization: Secondary | ICD-10-CM | POA: Diagnosis not present

## 2021-06-30 DIAGNOSIS — F331 Major depressive disorder, recurrent, moderate: Secondary | ICD-10-CM

## 2021-06-30 DIAGNOSIS — E349 Endocrine disorder, unspecified: Secondary | ICD-10-CM

## 2021-06-30 DIAGNOSIS — D649 Anemia, unspecified: Secondary | ICD-10-CM

## 2021-06-30 MED ORDER — OMEPRAZOLE 40 MG PO CPDR: DELAYED_RELEASE_CAPSULE | ORAL | 1 refills | Status: DC

## 2021-06-30 MED ORDER — LORATADINE 10 MG PO TABS: 10.0000 mg | ORAL_TABLET | Freq: Every day | ORAL | 1 refills | Status: DC

## 2021-06-30 MED ORDER — PREGABALIN 200 MG PO CAPS: 200.0000 mg | ORAL_CAPSULE | Freq: Two times a day (BID) | ORAL | 5 refills | Status: DC

## 2021-06-30 MED ORDER — SIMVASTATIN 40 MG PO TABS: 40.0000 mg | ORAL_TABLET | Freq: Every day | ORAL | 1 refills | Status: DC

## 2021-06-30 MED ORDER — SERTRALINE HCL 50 MG PO TABS: ORAL_TABLET | ORAL | 1 refills | Status: DC

## 2021-06-30 MED ORDER — GEMFIBROZIL 600 MG PO TABS: ORAL_TABLET | ORAL | 1 refills | Status: DC

## 2021-06-30 MED ORDER — CYANOCOBALAMIN 1000 MCG/ML IJ SOLN: INTRAMUSCULAR | 0 refills | Status: DC

## 2021-06-30 MED ORDER — FERROUS SULFATE 325 (65 FE) MG PO TABS: ORAL_TABLET | ORAL | 1 refills | Status: DC

## 2021-06-30 NOTE — Patient Instructions (Signed)
Mediterranean Diet A Mediterranean diet refers to food and lifestyle choices that are based on the traditions of countries located on the [place]. This way of eating has been shown to help prevent certain conditions and improve outcomes for people who have chronic diseases, like kidney disease and heart disease. What are tips for following this plan? Lifestyle  Cook and eat meals together with your family, when possible.  Drink enough fluid to keep your urine clear or pale yellow.  Be physically active every day. This includes: ? Aerobic exercise like running or swimming. ? Leisure activities like gardening, walking, or housework.  Get 7-8 hours of sleep each night.  If recommended by your health care provider, drink red wine in moderation. This means 1 glass a day for nonpregnant women and 2 glasses a day for men. A glass of wine equals 5 oz (150 mL). Reading food labels  Check the serving size of packaged foods. For foods such as rice and pasta, the serving size refers to the amount of cooked product, not dry.  Check the total fat in packaged foods. Avoid foods that have saturated fat or trans fats.  Check the ingredients list for added sugars, such as corn syrup.   Shopping  At the grocery store, buy most of your food from the areas near the walls of the store. This includes: ? Fresh fruits and vegetables (produce). ? Grains, beans, nuts, and seeds. Some of these may be available in unpackaged forms or large amounts (in bulk). ? Fresh seafood. ? Poultry and eggs. ? Low-fat dairy products.  Buy whole ingredients instead of prepackaged foods.  Buy fresh fruits and vegetables in-season from local farmers markets.  Buy frozen fruits and vegetables in resealable bags.  If you do not have access to quality fresh seafood, buy precooked frozen shrimp or canned fish, such as tuna, salmon, or sardines.  Buy small amounts of raw or cooked vegetables, salads, or olives from  the deli or salad bar at your store.  Stock your pantry so you always have certain foods on hand, such as olive oil, canned tuna, canned tomatoes, rice, pasta, and beans. Cooking  Cook foods with extra-virgin olive oil instead of using butter or other vegetable oils.  Have meat as a side dish, and have vegetables or grains as your main dish. This means having meat in small portions or adding small amounts of meat to foods like pasta or stew.  Use beans or vegetables instead of meat in common dishes like chili or lasagna.  Experiment with different cooking methods. Try roasting or broiling vegetables instead of steaming or sauteing them.  Add frozen vegetables to soups, stews, pasta, or rice.  Add nuts or seeds for added healthy fat at each meal. You can add these to yogurt, salads, or vegetable dishes.  Marinate fish or vegetables using olive oil, lemon juice, garlic, and fresh herbs. Meal planning  Plan to eat 1 vegetarian meal one day each week. Try to work up to 2 vegetarian meals, if possible.  Eat seafood 2 or more times a week.  Have healthy snacks readily available, such as: ? Vegetable sticks with hummus. ? Greek yogurt. ? Fruit and nut trail mix.  Eat balanced meals throughout the week. This includes: ? Fruit: 2-3 servings a day ? Vegetables: 4-5 servings a day ? Low-fat dairy: 2 servings a day ? Fish, poultry, or lean meat: 1 serving a day ? Beans and legumes: 2 or more servings a week ?   Nuts and seeds: 1-2 servings a day ? Whole grains: 6-8 servings a day ? Extra-virgin olive oil: 3-4 servings a day  Limit red meat and sweets to only a few servings a month   What are my food choices?  Mediterranean diet ? Recommended  Grains: Whole-grain pasta. Brown rice. Bulgar wheat. Polenta. Couscous. Whole-wheat bread. Oatmeal. Quinoa.  Vegetables: Artichokes. Beets. Broccoli. Cabbage. Carrots. Eggplant. Green beans. Chard. Kale. Spinach. Onions. Leeks. Peas. Squash.  Tomatoes. Peppers. Radishes.  Fruits: Apples. Apricots. Avocado. Berries. Bananas. Cherries. Dates. Figs. Grapes. Lemons. Melon. Oranges. Peaches. Plums. Pomegranate.  Meats and other protein foods: Beans. Almonds. Sunflower seeds. Pine nuts. Peanuts. Cod. Salmon. Scallops. Shrimp. Tuna. Tilapia. Clams. Oysters. Eggs.  Dairy: Low-fat milk. Cheese. Greek yogurt.  Beverages: Water. Red wine. Herbal tea.  Fats and oils: Extra virgin olive oil. Avocado oil. Grape seed oil.  Sweets and desserts: Greek yogurt with honey. Baked apples. Poached pears. Trail mix.  Seasoning and other foods: Basil. Cilantro. Coriander. Cumin. Mint. Parsley. Sage. Rosemary. Tarragon. Garlic. Oregano. Thyme. Pepper. Balsalmic vinegar. Tahini. Hummus. Tomato sauce. Olives. Mushrooms. ? Limit these  Grains: Prepackaged pasta or rice dishes. Prepackaged cereal with added sugar.  Vegetables: Deep fried potatoes (french fries).  Fruits: Fruit canned in syrup.  Meats and other protein foods: Beef. Pork. Lamb. Poultry with skin. Hot dogs. Bacon.  Dairy: Ice cream. Sour cream. Whole milk.  Beverages: Juice. Sugar-sweetened soft drinks. Beer. Liquor and spirits.  Fats and oils: Butter. Canola oil. Vegetable oil. Beef fat (tallow). Lard.  Sweets and desserts: Cookies. Cakes. Pies. Candy.  Seasoning and other foods: Mayonnaise. Premade sauces and marinades. The items listed may not be a complete list. Talk with your dietitian about what dietary choices are right for you. Summary  The Mediterranean diet includes both food and lifestyle choices.  Eat a variety of fresh fruits and vegetables, beans, nuts, seeds, and whole grains.  Limit the amount of red meat and sweets that you eat.  Talk with your health care provider about whether it is safe for you to drink red wine in moderation. This means 1 glass a day for nonpregnant women and 2 glasses a day for men. A glass of wine equals 5 oz (150 mL). This information  is not intended to replace advice given to you by your health care provider. Make sure you discuss any questions you have with your health care provider. Document Revised: 06/10/2016 Document Reviewed: 06/03/2016 Elsevier Patient Education  2020 [employer]  

## 2021-06-30 NOTE — Progress Notes (Signed)
Date:  06/30/2021   Name:  Jeremy [name]   DOB:  1951-10-12   MRN:  [id number]   Chief Complaint: Hyperlipidemia, Depression, Peripheral Neuropathy, Gastroesophageal Reflux, Allergic Rhinitis , Anemia, b12 deficiency, and Flu Vaccine  Hyperlipidemia This is a chronic problem. The current episode started more than 1 year ago. The problem is controlled. Recent lipid tests were reviewed and are normal. Exacerbating diseases include chronic renal disease and diabetes. He has no history of hypothyroidism. Pertinent negatives include no chest pain, focal weakness, leg pain, myalgias or shortness of breath. Current antihyperlipidemic treatment includes fibric acid derivatives and statins. Risk factors for coronary artery disease include diabetes mellitus, hypertension, male sex, dyslipidemia and a sedentary lifestyle.  Depression        This is a chronic problem.  The current episode started more than 1 year ago.   The problem occurs rarely.  Associated symptoms include no decreased concentration, no fatigue, no helplessness, no hopelessness, does not have insomnia, not irritable, no restlessness, no decreased interest, no appetite change, no body aches, no myalgias, no headaches, no indigestion, not sad and no suicidal ideas.  Past treatments include SSRIs - Selective serotonin reuptake inhibitors.  Compliance with treatment is good.  Previous treatment provided moderate relief.   Pertinent negatives include no hypothyroidism. Gastroesophageal Reflux He reports no abdominal pain, no belching, no chest pain, no choking, no coughing, no dysphagia, no heartburn, no hoarse voice, no nausea, no sore throat or no wheezing. The problem has been gradually improving. Nothing aggravates the symptoms. Pertinent negatives include no anemia, fatigue, melena, muscle weakness, orthopnea or weight loss. He has tried a PPI for the symptoms. The treatment provided moderate relief.  Anemia Presents for follow-up visit. There  has been no abdominal pain, bruising/bleeding easily, fever, light-headedness, malaise/fatigue, palpitations or weight loss. Signs of blood loss that are not present include melena. Past medical history includes chronic renal disease. There is no history of hypothyroidism.   Lab Results  Component Value Date   CREATININE 1.3 06/01/2021   BUN 39 (A) 06/01/2021   NA 136 (A) 06/01/2021   K 5.0 06/01/2021   CL 106 06/01/2021   CO2 17 06/01/2021   Lab Results  Component Value Date   CHOL 109 12/25/2020   HDL 26 (L) 12/25/2020   LDLCALC 50 12/25/2020   TRIG 204 (H) 12/25/2020   CHOLHDL 6.1 (H) 08/06/2016   Lab Results  Component Value Date   TSH 1.90 03/30/2021   Lab Results  Component Value Date   HGBA1C 4.9 03/30/2021   Lab Results  Component Value Date   WBC 3.1 (L) 06/18/2021   HGB 8.2 (L) 06/18/2021   HCT 25.7 (L) 06/18/2021   MCV 103.6 (H) 06/18/2021   PLT 184 06/18/2021   Lab Results  Component Value Date   ALT 10 12/25/2020   AST 18 12/25/2020   ALKPHOS 68 12/25/2020   BILITOT <0.2 12/25/2020     Review of Systems  Constitutional:  Negative for appetite change, chills, fatigue, fever, malaise/fatigue and weight loss.  HENT:  Negative for drooling, ear discharge, ear pain, hoarse voice and sore throat.   Respiratory:  Negative for cough, choking, shortness of breath and wheezing.   Cardiovascular:  Negative for chest pain, palpitations and leg swelling.  Gastrointestinal:  Negative for abdominal pain, blood in stool, constipation, diarrhea, dysphagia, heartburn, melena and nausea.  Endocrine: Negative for polydipsia.  Genitourinary:  Negative for dysuria, frequency, hematuria and urgency.  Musculoskeletal:  Negative  for back pain, myalgias, muscle weakness and neck pain.  Skin:  Negative for rash.  Allergic/Immunologic: Negative for environmental allergies.  Neurological:  Negative for dizziness, focal weakness, light-headedness and headaches.  Hematological:   Does not bruise/bleed easily.  Psychiatric/Behavioral:  Positive for depression. Negative for decreased concentration and suicidal ideas. The patient is not nervous/anxious and does not have insomnia.    Patient Active Problem List   Diagnosis Date Noted   Anemia in stage 3a chronic kidney disease ([place]) 06/19/2021   Iron deficiency anemia due to chronic blood loss 06/19/2021   Gait abnormality 05/07/2021   Chronic kidney disease, stage 3 unspecified ([employer]) 05/20/2020   DM type 2 with diabetic peripheral neuropathy ([employer]) 05/06/2020   Idiopathic peripheral neuropathy 01/15/2020   Generalized edema 01/15/2020   Primary pulmonary hypertension ([place]) 10/28/2019   Therapeutic drug monitoring 03/23/2018   Mild episode of recurrent major depressive disorder ([place]) 09/26/2017   Ventricular ectopic beats 09/26/2017   Type 2 diabetes mellitus with diabetic polyneuropathy, with long-term current use of insulin ([place]) 08/12/2017   Hyperlipidemia due to type 2 diabetes mellitus ([place]) 08/12/2017   B12 deficiency 12/14/2016   Low back pain 03/10/2016   Lumbosacral disc disease 01/01/2016   Neuropathy associated with endocrine disorder ([place]) 11/19/2015   B12 deficiency anemia 06/03/2015   Essential hypertension 06/03/2015   Hyperlipidemia 06/03/2015   Depression 06/03/2015   Gastroesophageal reflux disease without esophagitis 06/03/2015   Edema extremities 06/03/2015   Multiple sclerosis ([place]) 07/26/2013   Abnormality of gait 07/26/2013   Morbid obesity ([place]) 07/26/2013    Allergies  Allergen Reactions   Betadine [Povidone Iodine]     Past Surgical History:  Procedure Laterality Date   COLECTOMY  06-2008   COLONOSCOPY  2015   normal    Social History   Tobacco Use   Smoking status: Former    Packs/day: 1.50    Years: 30.00    Pack years: 45.00    Types: Cigarettes    Quit date: 01/23/2006    Years since quitting: 15.4   Smokeless tobacco: Never   Tobacco comments:    N/A  Vaping Use    Vaping Use: Never used  Substance Use Topics   Alcohol use: Yes    Alcohol/week: 0.0 standard drinks    Comment: rare; maybe 2 beers a year   Drug use: No     Medication list has been reviewed and updated.  Current Meds  Medication Sig   albuterol (VENTOLIN) (5 MG/ML) 0.5% NEBU Take by nebulization continuous.   aspirin 81 MG tablet Take 81 mg by mouth daily.   B-D INSULIN SYRINGE 1CC/25GX1" 25G X 1" 1 ML MISC USE AS DIRECTED   BD PEN NEEDLE NANO U/F 32G X 4 MM MISC    COMBIVENT RESPIMAT 20-100 MCG/ACT AERS respimat Inhale 1 puff into the lungs 4 (four) times daily.   cyanocobalamin (,VITAMIN B-12,) 1000 MCG/ML injection ADMINISTER 1 ML(1000 MCG) IN THE MUSCLE EVERY 14 DAYS   ferrous sulfate (FEROSUL) 325 (65 FE) MG tablet TAKE 1 TABLET(325 MG) BY MOUTH THREE TIMES DAILY   furosemide (LASIX) 40 MG tablet Take 1 tablet (40 mg total) by mouth every other day. (Patient taking differently: Take 20 mg by mouth every other day.)   gemfibrozil (LOPID) 600 MG tablet TAKE 1 TABLET(600 MG) BY MOUTH TWICE DAILY BEFORE A MEAL   Insulin Glargine (LANTUS) 100 UNIT/ML Solostar Pen Inject 32 Units into the skin daily. Dr Honor Junes   Interferon Beta-1b Camille Bal)  0.3 MG KIT injection Inject subcutaneously 1 syringe every other day.   loratadine (CLARITIN) 10 MG tablet Take 1 tablet (10 mg total) by mouth daily.   losartan (COZAAR) 25 MG tablet Take 1 tablet by mouth daily. singh   metFORMIN (GLUCOPHAGE) 500 MG tablet Take 1 tablet by mouth 2 (two) times daily.   omeprazole (PRILOSEC) 40 MG capsule One a day   ONETOUCH ULTRA test strip TEST TID   pregabalin (LYRICA) 200 MG capsule Take 1 capsule (200 mg total) by mouth 2 (two) times daily.   sertraline (ZOLOFT) 50 MG tablet TAKE 1/2 TABLET(25 MG) BY MOUTH DAILY   simvastatin (ZOCOR) 40 MG tablet Take 1 tablet (40 mg total) by mouth daily.   sodium bicarbonate 650 MG tablet Take 650 mg by mouth 2 (two) times daily.   traMADol (ULTRAM) 50 MG tablet  TAKE 1 TABLET(50 MG) BY MOUTH EVERY 6 HOURS AS NEEDED FOR PAIN    PHQ 2/9 Scores 06/30/2021 12/25/2020 11/05/2020 09/11/2020  PHQ - 2 Score 0 0 0 0  PHQ- 9 Score 0 0 - 0    GAD 7 : Generalized Anxiety Score 06/30/2021 09/11/2020 07/07/2020 01/15/2020  Nervous, Anxious, on Edge 0 0 0 0  Control/stop worrying 0 0 0 0  Worry too much - different things 0 0 0 0  Trouble relaxing 0 0 0 0  Restless 0 0 0 0  Easily annoyed or irritable 0 0 0 0  Afraid - awful might happen 0 0 0 0  Total GAD 7 Score 0 0 0 0    BP Readings from Last 3 Encounters:  06/30/21 110/60  06/24/21 103/61  06/18/21 (!) 157/66    Physical Exam Vitals and nursing note reviewed.  Constitutional:      General: He is not irritable. HENT:     Head: Normocephalic.     Right Ear: Tympanic membrane and external ear normal.     Left Ear: Tympanic membrane and external ear normal.     Nose: Nose normal.  Eyes:     General: No scleral icterus.       Right eye: No discharge.        Left eye: No discharge.     Conjunctiva/sclera: Conjunctivae normal.     Pupils: Pupils are equal, round, and reactive to light.  Neck:     Thyroid: No thyromegaly.     Vascular: No JVD.     Trachea: No tracheal deviation.  Cardiovascular:     Rate and Rhythm: Normal rate and regular rhythm.     Heart sounds: Normal heart sounds. No murmur heard.   No friction rub. No gallop.  Pulmonary:     Effort: No respiratory distress.     Breath sounds: Normal breath sounds. No wheezing or rales.  Abdominal:     General: Bowel sounds are normal.     Palpations: Abdomen is soft. There is no mass.     Tenderness: There is no abdominal tenderness. There is no guarding or rebound.  Musculoskeletal:        General: No tenderness. Normal range of motion.     Cervical back: Normal range of motion and neck supple.  Lymphadenopathy:     Cervical: No cervical adenopathy.  Skin:    General: Skin is warm.     Findings: No rash.  Neurological:     Mental  Status: He is alert and oriented to person, place, and time.     Cranial Nerves: No cranial  nerve deficit.     Deep Tendon Reflexes: Reflexes are normal and symmetric.    Wt Readings from Last 3 Encounters:  06/30/21 213 lb (96.6 kg)  06/18/21 214 lb 1.1 oz (97.1 kg)  05/07/21 217 lb (98.4 kg)    BP 110/60   Pulse 60   Ht 5' 10"  (1.778 m)   Wt 213 lb (96.6 kg)   BMI 30.56 kg/m   Assessment and Plan:  1. Mixed hyperlipidemia Chronic.  Controlled.  Stable.   Continue gemfibrozil 600 mg 1 twice a day and simvastatin 40 mg once a day.  Patient will have lipid panel done with specialty lab draw. - gemfibrozil (LOPID) 600 MG tablet; TAKE 1 TABLET(600 MG) BY MOUTH TWICE DAILY BEFORE A MEAL  Dispense: 180 tablet; Refill: 1 - simvastatin (ZOCOR) 40 MG tablet; Take 1 tablet (40 mg total) by mouth daily.  Dispense: 90 tablet; Refill: 1  2. Anemia, unspecified type Chronic.  Controlled.  Stable.  Followed by hematology.  Continue ferrous sulfate 325 mg 3 times a day. - ferrous sulfate (FEROSUL) 325 (65 FE) MG tablet; TAKE 1 TABLET(325 MG) BY MOUTH THREE TIMES DAILY  Dispense: 270 tablet; Refill: 1  3. 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  Controlled.  Stable.  PHQ is 0 Gad score is 0 continue sertraline 50 mg 1/2 tablets daily. - sertraline (ZOLOFT) 50 MG tablet; Take 1/2 tablet daily  Dispense: 45 tablet; Refill: 1  4. Gastroesophageal reflux disease without esophagitis Chronic.  Controlled.  Stable.  Continue omeprazole 40 mg once a day. - omeprazole (PRILOSEC) 40 MG capsule; One a day  Dispense: 90 capsule; Refill: 1  5. Neuropathy associated with endocrine disorder ([place]) .  Controlled.  Stable.  Continue pregabapentin 200 mg 1 capsule twice a day. - pregabalin (LYRICA) 200 MG capsule; Take 1 capsule (200 mg total) by mouth 2 (two) times daily.  Dispense: 60 capsule; Refill: 5  6. Idiopathic peripheral neuropathy Continue vitamin B12 supplementation  by injection every 14 days. - pregabalin (LYRICA) 200 MG capsule; Take 1 capsule (200 mg total) by mouth 2 (two) times daily.  Dispense: 60 capsule; Refill: 5  7. Other vitamin B12 deficiency anemia As noted above we will continue B12. - cyanocobalamin (,VITAMIN B-12,) 1000 MCG/ML injection; Administer every 14 days  Dispense: 6 mL; Refill: 0  8. Need for immunization against influenza Discussed and administered - Flu Vaccine QUAD High Dose(Fluad)

## 2021-07-02 ENCOUNTER — Encounter: Payer: Self-pay | Admitting: Oncology

## 2021-07-02 ENCOUNTER — Inpatient Hospital Stay: Payer: Medicare Other

## 2021-07-02 ENCOUNTER — Inpatient Hospital Stay: Payer: Medicare Other | Attending: Oncology | Admitting: Oncology

## 2021-07-02 ENCOUNTER — Other Ambulatory Visit: Payer: Self-pay

## 2021-07-02 VITALS — BP 119/65 | HR 68 | Resp 18

## 2021-07-02 VITALS — BP 162/59 | HR 78 | Temp 98.3°F | Resp 18 | Wt 213.7 lb

## 2021-07-02 DIAGNOSIS — D631 Anemia in chronic kidney disease: Secondary | ICD-10-CM

## 2021-07-02 DIAGNOSIS — Z79899 Other long term (current) drug therapy: Secondary | ICD-10-CM | POA: Insufficient documentation

## 2021-07-02 DIAGNOSIS — D500 Iron deficiency anemia secondary to blood loss (chronic): Secondary | ICD-10-CM

## 2021-07-02 DIAGNOSIS — Z794 Long term (current) use of insulin: Secondary | ICD-10-CM | POA: Insufficient documentation

## 2021-07-02 DIAGNOSIS — Z8639 Personal history of other endocrine, nutritional and metabolic disease: Secondary | ICD-10-CM

## 2021-07-02 DIAGNOSIS — Z7982 Long term (current) use of aspirin: Secondary | ICD-10-CM | POA: Diagnosis not present

## 2021-07-02 DIAGNOSIS — N1831 Chronic kidney disease, stage 3a: Secondary | ICD-10-CM | POA: Diagnosis not present

## 2021-07-02 DIAGNOSIS — D508 Other iron deficiency anemias: Secondary | ICD-10-CM | POA: Insufficient documentation

## 2021-07-02 MED ORDER — SODIUM CHLORIDE 0.9 % IV SOLN
Freq: Once | INTRAVENOUS | Status: AC
  Filled 2021-07-02: qty 250

## 2021-07-02 MED ORDER — SODIUM CHLORIDE 0.9 % IV SOLN: 200.0000 mg | Freq: Once | INTRAVENOUS | Status: DC

## 2021-07-02 MED ORDER — IRON SUCROSE 20 MG/ML IV SOLN
200.0000 mg | Freq: Once | INTRAVENOUS | Status: AC
  Administered 2021-07-02: 200 mg via INTRAVENOUS
  Filled 2021-07-02: qty 10

## 2021-07-02 NOTE — Progress Notes (Signed)
Hematology/Oncology Progress Note    Patient Care Team: Earlie Server, MD as PCP - General (Oncology) Marcial Pacas, MD as Consulting Physician (Neurology) Samara Deist, DPM as Consulting Physician (Podiatry) Lonia Farber, MD as Consulting Physician (Internal Medicine) Magnus Sinning, MD as Consulting Physician (Nephrology)  REFERRING PROVIDER: Juline Patch, MD  CHIEF COMPLAINTS/REASON FOR VISIT:  Follow  up for anemia  HISTORY OF PRESENTING ILLNESS:   Jeremy [name] is a  70 y.o.  male with PMH listed below was seen in consultation at the request of  Juline Patch, MD  for evaluation of anemia.  Patient was accompanied by wife. He reports feeling weak and fatigued. Patient has chronic anemia, baseline was 10-11. 06/01/2021, he had a blood work done at nephrologist office.  Hemoglobin 9.2, Patient has chronic kidney disease.  History of GI bleeding after colonoscopy Chronic respiratory failure on nasal cannula oxygen 2L due to COVID 19 infection.  INTERVAL HISTORY Jeremy [name] is a 70 y.o. male who has above history reviewed by me today presents for follow up visit for anemia Problems and complaints are listed below: Patient was found to have iron deficiency anemia .  He has received 1 dose of IV Venofer last week.  He reports tolerating well with no side effects.  Fatigue level has not significant improved yet.  Denies any bloody stool.  He has black stool and he attributes to being on iron supplementation orally.   Review of Systems  Constitutional:  Positive for fatigue. Negative for appetite change, chills and fever.  HENT:   Negative for hearing loss and voice change.   Eyes:  Negative for eye problems and icterus.  Respiratory:  Positive for shortness of breath. Negative for chest tightness and cough.   Cardiovascular:  Negative for chest pain and leg swelling.  Gastrointestinal:  Negative for abdominal distention and abdominal pain.  Endocrine: Negative for  hot flashes.  Genitourinary:  Negative for difficulty urinating, dysuria and frequency.   Musculoskeletal:  Negative for arthralgias.  Skin:  Negative for itching and rash.  Neurological:  Negative for light-headedness and numbness.  Hematological:  Negative for adenopathy. Does not bruise/bleed easily.  Psychiatric/Behavioral:  Negative for confusion.    MEDICAL HISTORY:  Past Medical History:  Diagnosis Date   Chronic pain    Depression    Diabetes (HCC)    GERD (gastroesophageal reflux disease)    Hyperlipemia    Hypertension    MS (multiple sclerosis) ([place])     SURGICAL HISTORY: Past Surgical History:  Procedure Laterality Date   COLECTOMY  06-2008   COLONOSCOPY  2015   normal    SOCIAL HISTORY: Social History   Socioeconomic History   Marital status: Married    Spouse name: [name]   Number of children: 2   Years of education: GED   Highest education level: Not on file  Occupational History    Comment: Disabled  Tobacco Use   Smoking status: Former    Packs/day: 1.50    Years: 30.00    Pack years: 45.00    Types: Cigarettes    Quit date: 01/23/2006    Years since quitting: 15.4   Smokeless tobacco: Never   Tobacco comments:    N/A  Vaping Use   Vaping Use: Never used  Substance and Sexual Activity   Alcohol use: Yes    Alcohol/week: 0.0 standard drinks    Comment: rare; maybe 2 beers a year   Drug use: No   Sexual  activity: Not Currently  Other Topics Concern   Not on file  Social History Narrative   Patient is disabled.    Patient lives with his wife Derelle Cockrell.    Patient has 2 children.       Social Determinants of Health   Financial Resource Strain: Low Risk    Difficulty of Paying Living Expenses: Not hard at all  Food Insecurity: No Food Insecurity   Worried About Charity fundraiser in the Last Year: Never true   [place] in the Last Year: Never true  Transportation Needs: No Transportation Needs   Lack of Transportation  (Medical): No   Lack of Transportation (Non-Medical): No  Physical Activity: Inactive   Days of Exercise per Week: 0 days   Minutes of Exercise per Session: 0 min  Stress: No Stress Concern Present   Feeling of Stress : Not at all  Social Connections: Moderately Isolated   Frequency of Communication with Friends and Family: More than three times a week   Frequency of Social Gatherings with Friends and Family: Once a week   Attends Religious Services: Never   Marine scientist or Organizations: No   Attends Music therapist: Never   Marital Status: Married  Human resources officer Violence: Not At Risk   Fear of Current or Ex-Partner: No   Emotionally Abused: No   Physically Abused: No   Sexually Abused: No    FAMILY HISTORY: Family History  Problem Relation Age of Onset   Lung cancer Mother    Heart attack Father    Heart attack Brother    COPD Brother    Diabetes Brother    Esophageal cancer Nephew     ALLERGIES:  is allergic to betadine [povidone iodine].  MEDICATIONS:  Current Outpatient Medications  Medication Sig Dispense Refill   albuterol (VENTOLIN) (5 MG/ML) 0.5% NEBU Take by nebulization continuous.     aspirin 81 MG tablet Take 81 mg by mouth daily.     B-D INSULIN SYRINGE 1CC/25GX1" 25G X 1" 1 ML MISC USE AS DIRECTED 10 each 2   BD PEN NEEDLE NANO U/F 32G X 4 MM MISC      COMBIVENT RESPIMAT 20-100 MCG/ACT AERS respimat Inhale 1 puff into the lungs 4 (four) times daily.     cyanocobalamin (,VITAMIN B-12,) 1000 MCG/ML injection Administer every 14 days 6 mL 0   ferrous sulfate (FEROSUL) 325 (65 FE) MG tablet TAKE 1 TABLET(325 MG) BY MOUTH THREE TIMES DAILY 270 tablet 1   furosemide (LASIX) 40 MG tablet Take 1 tablet (40 mg total) by mouth every other day. (Patient taking differently: Take 20 mg by mouth every other day.) 45 tablet 1   gemfibrozil (LOPID) 600 MG tablet TAKE 1 TABLET(600 MG) BY MOUTH TWICE DAILY BEFORE A MEAL 180 tablet 1   Insulin  Glargine (LANTUS) 100 UNIT/ML Solostar Pen Inject 32 Units into the skin daily. Dr Honor Junes     Interferon Beta-1b (BETASERON) 0.3 MG KIT injection Inject subcutaneously 1 syringe every other day. 45 kit 4   loratadine (CLARITIN) 10 MG tablet Take 1 tablet (10 mg total) by mouth daily. 90 tablet 1   losartan (COZAAR) 25 MG tablet Take 1 tablet by mouth daily. singh     metFORMIN (GLUCOPHAGE) 500 MG tablet Take 1 tablet by mouth 2 (two) times daily.     omeprazole (PRILOSEC) 40 MG capsule One a day 90 capsule 1   ONETOUCH ULTRA test strip  TEST TID     pregabalin (LYRICA) 200 MG capsule Take 1 capsule (200 mg total) by mouth 2 (two) times daily. 60 capsule 5   sertraline (ZOLOFT) 50 MG tablet Take 1/2 tablet daily 45 tablet 1   simvastatin (ZOCOR) 40 MG tablet Take 1 tablet (40 mg total) by mouth daily. 90 tablet 1   sodium bicarbonate 650 MG tablet Take 650 mg by mouth 2 (two) times daily.     traMADol (ULTRAM) 50 MG tablet TAKE 1 TABLET(50 MG) BY MOUTH EVERY 6 HOURS AS NEEDED FOR PAIN 120 tablet 5   No current facility-administered medications for this visit.     PHYSICAL EXAMINATION: ECOG PERFORMANCE STATUS: 2 - Symptomatic, <50% confined to bed Vitals:   07/02/21 1009  BP: (!) 162/59  Pulse: 78  Resp: 18  Temp: 98.3 F (36.8 C)  SpO2: 99%   Filed Weights   07/02/21 1009  Weight: 213 lb 11.8 oz (97 kg)    Physical Exam Constitutional:      General: He is not in acute distress. HENT:     Head: Normocephalic and atraumatic.  Eyes:     General: No scleral icterus. Cardiovascular:     Rate and Rhythm: Normal rate and regular rhythm.     Heart sounds: Normal heart sounds.  Pulmonary:     Effort: Pulmonary effort is normal. No respiratory distress.     Breath sounds: No wheezing.     Comments: Decreased breath sound bilaterally.  He presents comfortably on nasal cannula oxygen. Abdominal:     General: Bowel sounds are normal. There is no distension.     Palpations:  Abdomen is soft.  Musculoskeletal:        General: No deformity. Normal range of motion.     Cervical back: Normal range of motion and neck supple.  Skin:    General: Skin is warm and dry.     Findings: No erythema or rash.  Neurological:     Mental Status: He is alert and oriented to person, place, and time. Mental status is at baseline.     Cranial Nerves: No cranial nerve deficit.     Coordination: Coordination normal.  Psychiatric:        Mood and Affect: Mood normal.    LABORATORY DATA:  I have reviewed the data as listed Lab Results  Component Value Date   WBC 3.1 (L) 06/18/2021   HGB 8.2 (L) 06/18/2021   HCT 25.7 (L) 06/18/2021   MCV 103.6 (H) 06/18/2021   PLT 184 06/18/2021   Recent Labs    07/07/20 0952 12/25/20 1058 06/01/21 0000  NA  --  141 136*  K  --  4.9 5.0  CL  --  106 106  CO2  --  17* 17  GLUCOSE  --  108*  --   BUN  --  32* 39*  CREATININE  --  1.31* 1.3  CALCIUM  --  9.1 9.3  PROT 7.2 6.9  --   ALBUMIN 4.1 4.1 3.9  AST 17 18  --   ALT 10 10  --   ALKPHOS 66 68  --   BILITOT <0.2 <0.2  --   BILIDIR <0.10  --   --     Iron/TIBC/Ferritin/ %Sat    Component Value Date/Time   IRON 36 (L) 06/18/2021 1548   IRON 63 (L) 10/10/2013 1130   TIBC 491 (H) 06/18/2021 1548   TIBC 416 10/10/2013 1130   FERRITIN 32 06/18/2021  [place] 10/10/2013 1130   IRONPCTSAT 7 (L) 06/18/2021 1548   IRONPCTSAT 15 10/10/2013 1130       RADIOGRAPHIC STUDIES: I have personally reviewed the radiological images as listed and agreed with the findings in the report. No results found.    ASSESSMENT & PLAN:  1. Anemia in stage 3a chronic kidney disease ([place])   2. Iron deficiency anemia due to chronic blood loss   3. History of non anemic vitamin B12 deficiency    #Iron deficiency anemia, in the context of chronic kidney disease. Reviewed blood work-up.  Diagnosis of iron deficiency was discussed with him.  He may also has a component of anemia  secondary chronic kidney disease. I recommend additional IV Venofer treatment 200 mg weekly x4. I will see him in mid October with repeat blood work and evaluation of need of additional IV iron. He may not need to continue oral iron supplementation at this point. SPEP was negative. I referred patient to establish care with gastroenterology for further evaluation of IDA.  Patient is on vitamin B12 monthly injections.  Wife asked if patient needs to continue.  We will check B12 level at the next visit.  Follow-up mid October lab virtual MD +/- Venofer. Orders Placed This Encounter  Procedures   CBC with Differential/Platelet    Standing Status:   Future    Standing Expiration Date:   07/02/2022   Iron and TIBC    Standing Status:   Future    Standing Expiration Date:   07/02/2022   Ferritin    Standing Status:   Future    Standing Expiration Date:   07/02/2022   Vitamin B12    Standing Status:   Future    Standing Expiration Date:   07/02/2022    All questions were answered. The patient knows to call the clinic with any problems questions or concerns.  cc Juline Patch, MD     Earlie Server, MD, PhD Hematology Oncology  07/02/2021

## 2021-07-02 NOTE — Addendum Note (Signed)
Addended by: Evelina Dun on: 07/02/2021 03:24 PM   Modules accepted: Orders

## 2021-07-02 NOTE — Patient Instructions (Signed)

## 2021-07-02 NOTE — Progress Notes (Signed)
Pt here for follow up. No new concerns voiced.   

## 2021-07-06 DIAGNOSIS — E1142 Type 2 diabetes mellitus with diabetic polyneuropathy: Secondary | ICD-10-CM | POA: Diagnosis not present

## 2021-07-06 DIAGNOSIS — B351 Tinea unguium: Secondary | ICD-10-CM | POA: Diagnosis not present

## 2021-07-07 ENCOUNTER — Telehealth: Payer: Self-pay

## 2021-07-07 NOTE — Telephone Encounter (Signed)
Copied from [place] [phone]. Topic: General - Other >> Jul 07, 2021 10:43 AM Valere Dross wrote: Reason for CRM: Pts wife called in stating pt needs his labs orders sent to the Hematology office down stairs, please advise

## 2021-07-09 ENCOUNTER — Other Ambulatory Visit: Payer: Self-pay

## 2021-07-09 DIAGNOSIS — E785 Hyperlipidemia, unspecified: Secondary | ICD-10-CM

## 2021-07-09 DIAGNOSIS — E119 Type 2 diabetes mellitus without complications: Secondary | ICD-10-CM

## 2021-07-09 NOTE — Progress Notes (Signed)
Lipid panel and Microalbumin, urine added to labwork on Friday 9/16, per request from Dr. Ronnald Ramp office. Per Berton Mount, this was a patient request as he did not want to get stuck twice.

## 2021-07-10 ENCOUNTER — Other Ambulatory Visit: Payer: Medicare Other

## 2021-07-10 ENCOUNTER — Other Ambulatory Visit: Payer: Self-pay

## 2021-07-10 ENCOUNTER — Inpatient Hospital Stay: Payer: Medicare Other

## 2021-07-10 VITALS — BP 138/72 | HR 69 | Temp 97.7°F | Resp 18

## 2021-07-10 DIAGNOSIS — N1831 Chronic kidney disease, stage 3a: Secondary | ICD-10-CM | POA: Diagnosis not present

## 2021-07-10 DIAGNOSIS — D631 Anemia in chronic kidney disease: Secondary | ICD-10-CM | POA: Diagnosis not present

## 2021-07-10 DIAGNOSIS — Z7982 Long term (current) use of aspirin: Secondary | ICD-10-CM | POA: Diagnosis not present

## 2021-07-10 DIAGNOSIS — Z8639 Personal history of other endocrine, nutritional and metabolic disease: Secondary | ICD-10-CM

## 2021-07-10 DIAGNOSIS — Z79899 Other long term (current) drug therapy: Secondary | ICD-10-CM | POA: Diagnosis not present

## 2021-07-10 DIAGNOSIS — E119 Type 2 diabetes mellitus without complications: Secondary | ICD-10-CM

## 2021-07-10 DIAGNOSIS — E785 Hyperlipidemia, unspecified: Secondary | ICD-10-CM

## 2021-07-10 DIAGNOSIS — D508 Other iron deficiency anemias: Secondary | ICD-10-CM | POA: Diagnosis not present

## 2021-07-10 DIAGNOSIS — Z794 Long term (current) use of insulin: Secondary | ICD-10-CM | POA: Diagnosis not present

## 2021-07-10 DIAGNOSIS — D500 Iron deficiency anemia secondary to blood loss (chronic): Secondary | ICD-10-CM

## 2021-07-10 LAB — IRON AND TIBC
Iron: 24 ug/dL — ABNORMAL LOW (ref 45–182)
Saturation Ratios: 5 % — ABNORMAL LOW (ref 17.9–39.5)
TIBC: 487 ug/dL — ABNORMAL HIGH (ref 250–450)
UIBC: 463 ug/dL

## 2021-07-10 LAB — CBC WITH DIFFERENTIAL/PLATELET
Abs Immature Granulocytes: 0 10*3/uL (ref 0.00–0.07)
Basophils Absolute: 0 10*3/uL (ref 0.0–0.1)
Basophils Relative: 1 %
Eosinophils Absolute: 0.2 10*3/uL (ref 0.0–0.5)
Eosinophils Relative: 5 %
HCT: 26.2 % — ABNORMAL LOW (ref 39.0–52.0)
Hemoglobin: 8.2 g/dL — ABNORMAL LOW (ref 13.0–17.0)
Immature Granulocytes: 0 %
Lymphocytes Relative: 17 %
Lymphs Abs: 0.6 10*3/uL — ABNORMAL LOW (ref 0.7–4.0)
MCH: 32.2 pg (ref 26.0–34.0)
MCHC: 31.3 g/dL (ref 30.0–36.0)
MCV: 102.7 fL — ABNORMAL HIGH (ref 80.0–100.0)
Monocytes Absolute: 0.4 10*3/uL (ref 0.1–1.0)
Monocytes Relative: 12 %
Neutro Abs: 2.3 10*3/uL (ref 1.7–7.7)
Neutrophils Relative %: 65 %
Platelets: 186 10*3/uL (ref 150–400)
RBC: 2.55 MIL/uL — ABNORMAL LOW (ref 4.22–5.81)
RDW: 15.1 % (ref 11.5–15.5)
WBC: 3.5 10*3/uL — ABNORMAL LOW (ref 4.0–10.5)
nRBC: 0 % (ref 0.0–0.2)

## 2021-07-10 LAB — FERRITIN: Ferritin: 115 ng/mL (ref 24–336)

## 2021-07-10 LAB — LIPID PANEL
Cholesterol: 103 mg/dL (ref 0–200)
HDL: 22 mg/dL — ABNORMAL LOW (ref >40)
LDL Cholesterol: 35 mg/dL (ref 0–99)
Total CHOL/HDL Ratio: 4.7 RATIO
Triglycerides: 228 mg/dL — ABNORMAL HIGH (ref <150)
VLDL: 46 mg/dL — ABNORMAL HIGH (ref 0–40)

## 2021-07-10 LAB — VITAMIN B12: Vitamin B-12: 1809 pg/mL — ABNORMAL HIGH (ref 180–914)

## 2021-07-10 MED ORDER — IRON SUCROSE 20 MG/ML IV SOLN
200.0000 mg | Freq: Once | INTRAVENOUS | Status: AC
  Administered 2021-07-10: 200 mg via INTRAVENOUS

## 2021-07-10 MED ORDER — SODIUM CHLORIDE 0.9 % IV SOLN: 200.0000 mg | Freq: Once | INTRAVENOUS | Status: DC

## 2021-07-10 MED ORDER — SODIUM CHLORIDE 0.9 % IV SOLN
Freq: Once | INTRAVENOUS | Status: AC
  Filled 2021-07-10: qty 250

## 2021-07-11 LAB — MICROALBUMIN, URINE: Microalb, Ur: 34.6 ug/mL — ABNORMAL HIGH

## 2021-07-14 ENCOUNTER — Inpatient Hospital Stay (HOSPITAL_BASED_OUTPATIENT_CLINIC_OR_DEPARTMENT_OTHER): Payer: Medicare Other | Admitting: Oncology

## 2021-07-14 ENCOUNTER — Encounter: Payer: Self-pay | Admitting: Oncology

## 2021-07-14 DIAGNOSIS — D7589 Other specified diseases of blood and blood-forming organs: Secondary | ICD-10-CM | POA: Diagnosis not present

## 2021-07-14 DIAGNOSIS — D508 Other iron deficiency anemias: Secondary | ICD-10-CM

## 2021-07-14 NOTE — Progress Notes (Signed)
Patient contacted for Mychart visit. He reports concerns due to low iron levels.

## 2021-07-14 NOTE — Progress Notes (Signed)
HEMATOLOGY-ONCOLOGY TeleHEALTH VISIT PROGRESS NOTE  I connected with Jeremy [name] on 07/14/21  at  2:45 PM EDT by video enabled telemedicine visit and verified that I am speaking with the correct person using two identifiers. I discussed the limitations, risks, security and privacy concerns of performing an evaluation and management service by telemedicine and the availability of in-person appointments. The patient expressed understanding and agreed to proceed.   Other persons participating in the visit and their role in the encounter:  None  Patient's location: Home  Provider's location: office Chief Complaint: anemia   INTERVAL HISTORY Jeremy [name] is a 70 y.o. male who has above history reviewed by me today presents for follow up visit for anemia Patient has had IV venofer treatments. Continues to have fatigue.  Review of Systems  Constitutional:  Positive for fatigue. Negative for appetite change, chills, fever and unexpected weight change.  HENT:   Negative for hearing loss and voice change.   Eyes:  Negative for eye problems and icterus.  Respiratory:  Negative for chest tightness, cough and shortness of breath.   Cardiovascular:  Negative for chest pain and leg swelling.  Gastrointestinal:  Negative for abdominal distention and abdominal pain.  Endocrine: Negative for hot flashes.  Genitourinary:  Negative for difficulty urinating, dysuria and frequency.   Musculoskeletal:  Negative for arthralgias.  Skin:  Negative for itching and rash.  Neurological:  Negative for light-headedness and numbness.  Hematological:  Negative for adenopathy. Does not bruise/bleed easily.  Psychiatric/Behavioral:  Negative for confusion.    Past Medical History:  Diagnosis Date   Chronic pain    Depression    Diabetes (HCC)    GERD (gastroesophageal reflux disease)    Hyperlipemia    Hypertension    MS (multiple sclerosis) (HCC)    Past Surgical History:  Procedure Laterality Date    COLECTOMY  06-2008   COLONOSCOPY  2015   normal    Family History  Problem Relation Age of Onset   Lung cancer Mother    Heart attack Father    Heart attack Brother    COPD Brother    Diabetes Brother    Esophageal cancer Nephew     Social History   Socioeconomic History   Marital status: Married    Spouse name: [name]   Number of children: 2   Years of education: GED   Highest education level: Not on file  Occupational History    Comment: Disabled  Tobacco Use   Smoking status: Former    Packs/day: 1.50    Years: 30.00    Pack years: 45.00    Types: Cigarettes    Quit date: 01/23/2006    Years since quitting: 15.4   Smokeless tobacco: Never   Tobacco comments:    N/A  Vaping Use   Vaping Use: Never used  Substance and Sexual Activity   Alcohol use: Yes    Alcohol/week: 0.0 standard drinks    Comment: rare; maybe 2 beers a year   Drug use: No   Sexual activity: Not Currently  Other Topics Concern   Not on file  Social History Narrative   Patient is disabled.    Patient lives with his wife Layken Doenges.    Patient has 2 children.       Social Determinants of Health   Financial Resource Strain: Low Risk    Difficulty of Paying Living Expenses: Not hard at all  Food Insecurity: No Food Insecurity   Worried About Running Out  of Food in the Last Year: Never true   [place] in the Last Year: Never true  Transportation Needs: No Transportation Needs   Lack of Transportation (Medical): No   Lack of Transportation (Non-Medical): No  Physical Activity: Inactive   Days of Exercise per Week: 0 days   Minutes of Exercise per Session: 0 min  Stress: No Stress Concern Present   Feeling of Stress : Not at all  Social Connections: Moderately Isolated   Frequency of Communication with Friends and Family: More than three times a week   Frequency of Social Gatherings with Friends and Family: Once a week   Attends Religious Services: Never   Marine scientist or  Organizations: No   Attends Music therapist: Never   Marital Status: Married  Human resources officer Violence: Not At Risk   Fear of Current or Ex-Partner: No   Emotionally Abused: No   Physically Abused: No   Sexually Abused: No    Current Outpatient Medications on File Prior to Visit  Medication Sig Dispense Refill   albuterol (VENTOLIN) (5 MG/ML) 0.5% NEBU Take by nebulization continuous.     aspirin 81 MG tablet Take 81 mg by mouth daily.     B-D INSULIN SYRINGE 1CC/25GX1" 25G X 1" 1 ML MISC USE AS DIRECTED 10 each 2   BD PEN NEEDLE NANO U/F 32G X 4 MM MISC      COMBIVENT RESPIMAT 20-100 MCG/ACT AERS respimat Inhale 1 puff into the lungs 4 (four) times daily.     cyanocobalamin (,VITAMIN B-12,) 1000 MCG/ML injection Administer every 14 days 6 mL 0   furosemide (LASIX) 40 MG tablet Take 1 tablet (40 mg total) by mouth every other day. (Patient taking differently: Take 20 mg by mouth every other day.) 45 tablet 1   gemfibrozil (LOPID) 600 MG tablet TAKE 1 TABLET(600 MG) BY MOUTH TWICE DAILY BEFORE A MEAL 180 tablet 1   Insulin Glargine (LANTUS) 100 UNIT/ML Solostar Pen Inject 32 Units into the skin daily. Dr Honor Junes     Interferon Beta-1b (BETASERON) 0.3 MG KIT injection Inject subcutaneously 1 syringe every other day. 45 kit 4   loratadine (CLARITIN) 10 MG tablet Take 1 tablet (10 mg total) by mouth daily. 90 tablet 1   losartan (COZAAR) 25 MG tablet Take 1 tablet by mouth daily. singh     metFORMIN (GLUCOPHAGE) 500 MG tablet Take 1 tablet by mouth 2 (two) times daily.     omeprazole (PRILOSEC) 40 MG capsule One a day 90 capsule 1   ONETOUCH ULTRA test strip TEST TID     pregabalin (LYRICA) 200 MG capsule Take 1 capsule (200 mg total) by mouth 2 (two) times daily. 60 capsule 5   sertraline (ZOLOFT) 50 MG tablet Take 1/2 tablet daily 45 tablet 1   simvastatin (ZOCOR) 40 MG tablet Take 1 tablet (40 mg total) by mouth daily. 90 tablet 1   sodium bicarbonate 650 MG tablet Take  650 mg by mouth 2 (two) times daily.     traMADol (ULTRAM) 50 MG tablet TAKE 1 TABLET(50 MG) BY MOUTH EVERY 6 HOURS AS NEEDED FOR PAIN 120 tablet 5   ferrous sulfate (FEROSUL) 325 (65 FE) MG tablet TAKE 1 TABLET(325 MG) BY MOUTH THREE TIMES DAILY (Patient not taking: Reported on 07/14/2021) 270 tablet 1   No current facility-administered medications on file prior to visit.    Allergies  Allergen Reactions   Betadine [Povidone Iodine]  Observations/Objective: There were no vitals filed for this visit. There is no height or weight on file to calculate BMI.  Physical Exam Neurological:     Mental Status: He is alert.    CBC    Component Value Date/Time   WBC 3.5 (L) 07/10/2021 1314   RBC 2.55 (L) 07/10/2021 1314   HGB 8.2 (L) 07/10/2021 1314   HGB 9.8 (L) 12/25/2020 1058   HCT 26.2 (L) 07/10/2021 1314   HCT 29.5 (L) 12/25/2020 1058   PLT 186 07/10/2021 1314   PLT 173 12/25/2020 1058   MCV 102.7 (H) 07/10/2021 1314   MCV 97 12/25/2020 1058   MCV 94 10/10/2013 1130   MCH 32.2 07/10/2021 1314   MCHC 31.3 07/10/2021 1314   RDW 15.1 07/10/2021 1314   RDW 13.5 12/25/2020 1058   RDW 16.4 (H) 10/10/2013 1130   LYMPHSABS 0.6 (L) 07/10/2021 1314   LYMPHSABS 0.8 12/25/2020 1058   LYMPHSABS 1.0 10/10/2013 1130   MONOABS 0.4 07/10/2021 1314   MONOABS 0.4 10/10/2013 1130   EOSABS 0.2 07/10/2021 1314   EOSABS 0.2 12/25/2020 1058   EOSABS 0.1 10/10/2013 1130   BASOSABS 0.0 07/10/2021 1314   BASOSABS 0.1 12/25/2020 1058   BASOSABS 0.1 10/10/2013 1130    CMP     Component Value Date/Time   NA 136 (A) 06/01/2021 0000   NA 143 08/31/2013 0417   K 5.0 06/01/2021 0000   K 3.5 08/31/2013 0417   CL 106 06/01/2021 0000   CL 109 (H) 08/31/2013 0417   CO2 17 06/01/2021 0000   CO2 28 08/31/2013 0417   GLUCOSE 108 (H) 12/25/2020 1058   GLUCOSE 133 (H) 08/31/2013 0417   BUN 39 (A) 06/01/2021 0000   BUN 39 (H) 08/31/2013 0417   CREATININE 1.3 06/01/2021 0000   CREATININE 1.31  (H) 12/25/2020 1058   CREATININE 1.53 (H) 08/31/2013 0417   CALCIUM 9.3 06/01/2021 0000   CALCIUM 8.9 08/31/2013 0417   PROT 6.9 12/25/2020 1058   PROT 6.2 (L) 08/31/2013 0417   ALBUMIN 3.9 06/01/2021 0000   ALBUMIN 4.1 12/25/2020 1058   ALBUMIN 2.9 (L) 08/31/2013 0417   AST 18 12/25/2020 1058   AST 35 08/31/2013 0417   ALT 10 12/25/2020 1058   ALT 15 08/31/2013 0417   ALKPHOS 68 12/25/2020 1058   ALKPHOS 60 08/31/2013 0417   BILITOT <0.2 12/25/2020 1058   BILITOT 0.6 08/31/2013 0417   GFRNONAA 53 (L) 01/15/2020 0954   GFRNONAA 48 (L) 08/31/2013 0417   GFRAA 61 01/15/2020 0954   GFRAA 56 (L) 08/31/2013 0417     Assessment and Plan: 1. Other iron deficiency anemia   2. Macrocytosis     Labs are reviewed and discussed with patient. No significant improvement of hemoglobin.  Iron panel shows persistent decreased iron saturation. Ferritin has improved.  Recommend IV venofer weekly x 4 Suspect GI blood loss.  He has appt with Dr.Wohl in Nov 2022.   Macrocytosis, check LDH, haptoglobin, retic panel, UA, LFT.  Follow Up Instructions: 3 months   I discussed the assessment and treatment plan with the patient. The patient was provided an opportunity to ask questions and all were answered. The patient agreed with the plan and demonstrated an understanding of the instructions.  The patient was advised to call back or seek an in-person evaluation if the symptoms worsen or if the condition fails to improve as anticipated.   Earlie Server, MD 07/14/2021 9:42 PM

## 2021-07-15 ENCOUNTER — Other Ambulatory Visit: Payer: Self-pay | Admitting: Neurology

## 2021-07-15 ENCOUNTER — Other Ambulatory Visit: Payer: Self-pay

## 2021-07-15 ENCOUNTER — Telehealth: Payer: Self-pay | Admitting: Gastroenterology

## 2021-07-15 ENCOUNTER — Telehealth: Payer: Self-pay | Admitting: Neurology

## 2021-07-15 ENCOUNTER — Inpatient Hospital Stay: Payer: Medicare Other

## 2021-07-15 VITALS — BP 123/71 | HR 70 | Temp 98.0°F | Resp 18

## 2021-07-15 DIAGNOSIS — Z7982 Long term (current) use of aspirin: Secondary | ICD-10-CM | POA: Diagnosis not present

## 2021-07-15 DIAGNOSIS — Z794 Long term (current) use of insulin: Secondary | ICD-10-CM | POA: Diagnosis not present

## 2021-07-15 DIAGNOSIS — N1831 Chronic kidney disease, stage 3a: Secondary | ICD-10-CM

## 2021-07-15 DIAGNOSIS — D631 Anemia in chronic kidney disease: Secondary | ICD-10-CM | POA: Diagnosis not present

## 2021-07-15 DIAGNOSIS — Z79899 Other long term (current) drug therapy: Secondary | ICD-10-CM | POA: Diagnosis not present

## 2021-07-15 DIAGNOSIS — D508 Other iron deficiency anemias: Secondary | ICD-10-CM | POA: Diagnosis not present

## 2021-07-15 DIAGNOSIS — D500 Iron deficiency anemia secondary to blood loss (chronic): Secondary | ICD-10-CM

## 2021-07-15 MED ORDER — SODIUM CHLORIDE 0.9 % IV SOLN: 200.0000 mg | Freq: Once | INTRAVENOUS | Status: DC

## 2021-07-15 MED ORDER — IRON SUCROSE 20 MG/ML IV SOLN
200.0000 mg | Freq: Once | INTRAVENOUS | Status: AC
  Administered 2021-07-15: 200 mg via INTRAVENOUS
  Filled 2021-07-15: qty 10

## 2021-07-15 MED ORDER — SODIUM CHLORIDE 0.9 % IV SOLN
Freq: Once | INTRAVENOUS | Status: AC
  Filled 2021-07-15: qty 250

## 2021-07-15 NOTE — Telephone Encounter (Signed)
Pt. Wife says husband is on infusions and his hematologist says he is bleeding internally. She is requesting a call back to discuss

## 2021-07-15 NOTE — Telephone Encounter (Signed)
Pt's wife [name] called requesting refill for traMADol (ULTRAM) 50 MG tablet. [place] [phone].

## 2021-07-16 ENCOUNTER — Telehealth: Payer: Medicare Other | Admitting: Oncology

## 2021-07-23 ENCOUNTER — Inpatient Hospital Stay: Payer: Medicare Other

## 2021-07-23 ENCOUNTER — Other Ambulatory Visit: Payer: Self-pay

## 2021-07-23 VITALS — BP 145/65 | HR 70 | Temp 97.8°F | Resp 18

## 2021-07-23 DIAGNOSIS — D631 Anemia in chronic kidney disease: Secondary | ICD-10-CM

## 2021-07-23 DIAGNOSIS — D508 Other iron deficiency anemias: Secondary | ICD-10-CM

## 2021-07-23 DIAGNOSIS — N1831 Chronic kidney disease, stage 3a: Secondary | ICD-10-CM

## 2021-07-23 DIAGNOSIS — D500 Iron deficiency anemia secondary to blood loss (chronic): Secondary | ICD-10-CM

## 2021-07-23 DIAGNOSIS — Z7982 Long term (current) use of aspirin: Secondary | ICD-10-CM | POA: Diagnosis not present

## 2021-07-23 DIAGNOSIS — Z79899 Other long term (current) drug therapy: Secondary | ICD-10-CM | POA: Diagnosis not present

## 2021-07-23 DIAGNOSIS — Z794 Long term (current) use of insulin: Secondary | ICD-10-CM | POA: Diagnosis not present

## 2021-07-23 LAB — URINALYSIS, COMPLETE (UACMP) WITH MICROSCOPIC
Bilirubin Urine: NEGATIVE
Glucose, UA: NEGATIVE mg/dL
Hgb urine dipstick: NEGATIVE
Ketones, ur: NEGATIVE mg/dL
Leukocytes,Ua: NEGATIVE
Nitrite: NEGATIVE
Protein, ur: 30 mg/dL — AB
Specific Gravity, Urine: 1.015 (ref 1.005–1.030)
pH: 5.5 (ref 5.0–8.0)

## 2021-07-23 LAB — RETIC PANEL
Immature Retic Fract: 33.5 % — ABNORMAL HIGH (ref 2.3–15.9)
RBC.: 2.55 MIL/uL — ABNORMAL LOW (ref 4.22–5.81)
Retic Count, Absolute: 113 10*3/uL (ref 19.0–186.0)
Retic Ct Pct: 4.4 % — ABNORMAL HIGH (ref 0.4–3.1)
Reticulocyte Hemoglobin: 29.4 pg (ref >27.9)

## 2021-07-23 LAB — HEPATIC FUNCTION PANEL
ALT: 14 U/L (ref 0–44)
AST: 22 U/L (ref 15–41)
Albumin: 3.4 g/dL — ABNORMAL LOW (ref 3.5–5.0)
Alkaline Phosphatase: 58 U/L (ref 38–126)
Bilirubin, Direct: <0.1 mg/dL (ref 0.0–0.2)
Total Bilirubin: 0.7 mg/dL (ref 0.3–1.2)
Total Protein: 7.3 g/dL (ref 6.5–8.1)

## 2021-07-23 LAB — LACTATE DEHYDROGENASE: LDH: 132 U/L (ref 98–192)

## 2021-07-23 LAB — DAT, POLYSPECIFIC AHG (ARMC ONLY): Polyspecific AHG test: NEGATIVE

## 2021-07-23 MED ORDER — SODIUM CHLORIDE 0.9 % IV SOLN: 200.0000 mg | Freq: Once | INTRAVENOUS | Status: DC

## 2021-07-23 MED ORDER — IRON SUCROSE 20 MG/ML IV SOLN
200.0000 mg | Freq: Once | INTRAVENOUS | Status: AC
  Administered 2021-07-23: 200 mg via INTRAVENOUS

## 2021-07-23 MED ORDER — IRON SUCROSE 20 MG/ML IV SOLN: 200.0000 mg | Freq: Once | INTRAVENOUS | Status: DC

## 2021-07-23 MED ORDER — SODIUM CHLORIDE 0.9 % IV SOLN
INTRAVENOUS | Status: DC | PRN
  Filled 2021-07-23: qty 250

## 2021-07-23 MED ORDER — SODIUM CHLORIDE 0.9 % IV SOLN
Freq: Once | INTRAVENOUS | Status: AC
  Filled 2021-07-23: qty 250

## 2021-07-24 LAB — HAPTOGLOBIN: Haptoglobin: 22 mg/dL — ABNORMAL LOW (ref 32–363)

## 2021-07-30 ENCOUNTER — Inpatient Hospital Stay: Payer: Medicare Other | Attending: Oncology

## 2021-07-30 ENCOUNTER — Other Ambulatory Visit: Payer: Self-pay

## 2021-07-30 VITALS — BP 144/71 | HR 71 | Temp 97.0°F | Resp 18

## 2021-07-30 DIAGNOSIS — D508 Other iron deficiency anemias: Secondary | ICD-10-CM | POA: Insufficient documentation

## 2021-07-30 DIAGNOSIS — N1831 Chronic kidney disease, stage 3a: Secondary | ICD-10-CM

## 2021-07-30 DIAGNOSIS — D500 Iron deficiency anemia secondary to blood loss (chronic): Secondary | ICD-10-CM

## 2021-07-30 MED ORDER — IRON SUCROSE 20 MG/ML IV SOLN
200.0000 mg | Freq: Once | INTRAVENOUS | Status: AC
  Administered 2021-07-30: 200 mg via INTRAVENOUS
  Filled 2021-07-30: qty 10

## 2021-07-30 MED ORDER — SODIUM CHLORIDE 0.9 % IV SOLN
Freq: Once | INTRAVENOUS | Status: AC
  Filled 2021-07-30: qty 250

## 2021-07-30 MED ORDER — SODIUM CHLORIDE 0.9 % IV SOLN: 200.0000 mg | Freq: Once | INTRAVENOUS | Status: DC

## 2021-07-31 ENCOUNTER — Other Ambulatory Visit: Payer: Self-pay | Admitting: Family Medicine

## 2021-07-31 DIAGNOSIS — E782 Mixed hyperlipidemia: Secondary | ICD-10-CM

## 2021-07-31 NOTE — Telephone Encounter (Signed)
Requested medication (s) are due for refill today: No  Requested medication (s) are on the active medication list: Yes  Last refill:  06/30/21  Future visit scheduled: No  Notes to clinic:  Protocol indicates pt. Needs lab work.    Requested Prescriptions  Pending Prescriptions Disp Refills   simvastatin (ZOCOR) 40 MG tablet [Pharmacy Med Name: SIMVASTATIN 40MG TABLETS] 90 tablet 1    Sig: TAKE 1 TABLET(40 MG) BY MOUTH DAILY     Cardiovascular:  Antilipid - Statins Failed - 07/31/2021  3:36 AM      Failed - HDL in normal range and within 360 days    HDL  Date Value Ref Range Status  07/10/2021 22 (L) >40 mg/dL Final  12/25/2020 26 (L) >39 mg/dL Final          Failed - Triglycerides in normal range and within 360 days    Triglycerides  Date Value Ref Range Status  07/10/2021 228 (H) <150 mg/dL Final          Passed - Total Cholesterol in normal range and within 360 days    Cholesterol, Total  Date Value Ref Range Status  12/25/2020 109 100 - 199 mg/dL Final   Cholesterol  Date Value Ref Range Status  07/10/2021 103 0 - 200 mg/dL Final          Passed - LDL in normal range and within 360 days    LDL Chol Calc (NIH)  Date Value Ref Range Status  12/25/2020 50 0 - 99 mg/dL Final   LDL Cholesterol  Date Value Ref Range Status  07/10/2021 35 0 - 99 mg/dL Final    Comment:           Total Cholesterol/HDL:CHD Risk Coronary Heart Disease Risk Table                     Men   Women  1/2 Average Risk   3.4   3.3  Average Risk       5.0   4.4  2 X Average Risk   9.6   7.1  3 X Average Risk  23.4   11.0        Use the calculated Patient Ratio above and the CHD Risk Table to determine the patient's CHD Risk.        ATP III CLASSIFICATION (LDL):  <100     mg/dL   Optimal  100-129  mg/dL   Near or Above                    Optimal  130-159  mg/dL   Borderline  160-189  mg/dL   High  >190     mg/dL   Very High Performed at [facility] [address],  [place], [place] [address]           Passed - Patient is not pregnant      Passed - Valid encounter within last 12 months    Recent Outpatient Visits           1 month ago Mixed hyperlipidemia   [facility] Juline Patch, MD   7 months ago Post-COVID chronic dyspnea   [facility] Juline Patch, MD   10 months ago Acute maxillary sinusitis, recurrence not specified   [facility] Juline Patch, MD   1 year ago Moderate episode of recurrent major depressive disorder [facility])   [place],  Iven Finn, MD   1 year ago Mixed hyperlipidemia   [facility] Juline Patch, MD               gemfibrozil (LOPID) 600 MG tablet [Pharmacy Med Name: GEMFIBROZIL 600MG TABLETS] 180 tablet 1    Sig: TAKE 1 TABLET(600 MG) BY MOUTH TWICE DAILY BEFORE A MEAL     Cardiovascular:  Antilipid - Fibric Acid Derivatives Failed - 07/31/2021  3:36 AM      Failed - HDL in normal range and within 360 days    HDL  Date Value Ref Range Status  07/10/2021 22 (L) >40 mg/dL Final  12/25/2020 26 (L) >39 mg/dL Final          Failed - Triglycerides in normal range and within 360 days    Triglycerides  Date Value Ref Range Status  07/10/2021 228 (H) <150 mg/dL Final          Failed - eGFR in normal range and within 180 days    EGFR (African American)  Date Value Ref Range Status  08/31/2013 56 (L)  Final   GFR calc Af Amer  Date Value Ref Range Status  01/15/2020 61 >59 mL/min/1.73 Final   EGFR (Non-African Amer.)  Date Value Ref Range Status  08/31/2013 48 (L)  Final    Comment:    eGFR values <63m/min/1.73 m2 may be an indication of chronic kidney disease (CKD). Calculated eGFR is useful in patients with stable renal function. The eGFR calculation will not be reliable in acutely ill patients when serum creatinine is changing rapidly. It is not useful in  patients on dialysis. The eGFR calculation may not be applicable to patients at the  low and high extremes of body sizes, pregnant women, and vegetarians.    GFR calc non Af Amer  Date Value Ref Range Status  01/15/2020 53 (L) >59 mL/min/1.73 Final   eGFR  Date Value Ref Range Status  12/25/2020 59 (L) >59 mL/min/1.73 Final    Comment:    **In accordance with recommendations from the NKF-ASN Task force,**   [facility] has updated its eGFR calculation to the 2021 CKD-EPI   creatinine equation that estimates kidney function without a race   variable.           Passed - Total Cholesterol in normal range and within 360 days    Cholesterol, Total  Date Value Ref Range Status  12/25/2020 109 100 - 199 mg/dL Final   Cholesterol  Date Value Ref Range Status  07/10/2021 103 0 - 200 mg/dL Final          Passed - LDL in normal range and within 360 days    LDL Chol Calc (NIH)  Date Value Ref Range Status  12/25/2020 50 0 - 99 mg/dL Final   LDL Cholesterol  Date Value Ref Range Status  07/10/2021 35 0 - 99 mg/dL Final    Comment:           Total Cholesterol/HDL:CHD Risk Coronary Heart Disease Risk Table                     Men   Women  1/2 Average Risk   3.4   3.3  Average Risk       5.0   4.4  2 X Average Risk   9.6   7.1  3 X Average Risk  23.4   11.0        Use the calculated Patient  Ratio above and the CHD Risk Table to determine the patient's CHD Risk.        ATP III CLASSIFICATION (LDL):  <100     mg/dL   Optimal  100-129  mg/dL   Near or Above                    Optimal  130-159  mg/dL   Borderline  160-189  mg/dL   High  >190     mg/dL   Very High Performed at [facility] [address], [place], [place] [address]           Passed - ALT in normal range and within 180 days    ALT  Date Value Ref Range Status  07/23/2021 14 0 - 44 U/L Final   SGPT (ALT)  Date Value Ref Range Status  08/31/2013 15 12 - 78 U/L Final          Passed - AST in normal range and within 180 days    AST  Date Value Ref Range Status  07/23/2021 22 15  - 41 U/L Final   SGOT(AST)  Date Value Ref Range Status  08/31/2013 35 15 - 37 Unit/L Final          Passed - Cr in normal range and within 180 days    Creatinine  Date Value Ref Range Status  06/01/2021 1.3 0.6 - 1.3 Final  08/31/2013 1.53 (H) 0.60 - 1.30 mg/dL Final   Creatinine, Ser  Date Value Ref Range Status  12/25/2020 1.31 (H) 0.76 - 1.27 mg/dL Final          Passed - Valid encounter within last 12 months    Recent Outpatient Visits           1 month ago Mixed hyperlipidemia   [facility] Juline Patch, MD   7 months ago Post-COVID chronic dyspnea   [facility] Juline Patch, MD   10 months ago Acute maxillary sinusitis, recurrence not specified   [facility] Juline Patch, MD   1 year ago Moderate episode of recurrent major depressive disorder [facility])   [facility] Juline Patch, MD   1 year ago Mixed hyperlipidemia   [facility] Juline Patch, MD

## 2021-08-06 ENCOUNTER — Telehealth: Payer: Self-pay

## 2021-08-06 ENCOUNTER — Other Ambulatory Visit: Payer: Self-pay | Admitting: Family Medicine

## 2021-08-06 ENCOUNTER — Inpatient Hospital Stay: Payer: Medicare Other

## 2021-08-06 ENCOUNTER — Other Ambulatory Visit: Payer: Self-pay

## 2021-08-06 VITALS — BP 134/65 | HR 69 | Temp 98.0°F | Resp 18

## 2021-08-06 DIAGNOSIS — E782 Mixed hyperlipidemia: Secondary | ICD-10-CM

## 2021-08-06 DIAGNOSIS — D631 Anemia in chronic kidney disease: Secondary | ICD-10-CM

## 2021-08-06 DIAGNOSIS — D500 Iron deficiency anemia secondary to blood loss (chronic): Secondary | ICD-10-CM

## 2021-08-06 DIAGNOSIS — N1831 Chronic kidney disease, stage 3a: Secondary | ICD-10-CM

## 2021-08-06 DIAGNOSIS — D508 Other iron deficiency anemias: Secondary | ICD-10-CM | POA: Diagnosis not present

## 2021-08-06 MED ORDER — IRON SUCROSE 20 MG/ML IV SOLN
200.0000 mg | Freq: Once | INTRAVENOUS | Status: AC
  Administered 2021-08-06: 200 mg via INTRAVENOUS
  Filled 2021-08-06: qty 10

## 2021-08-06 MED ORDER — SIMVASTATIN 40 MG PO TABS: 40.0000 mg | ORAL_TABLET | Freq: Every day | ORAL | 1 refills | Status: DC

## 2021-08-06 MED ORDER — SODIUM CHLORIDE 0.9 % IV SOLN: 200.0000 mg | Freq: Once | INTRAVENOUS | Status: DC

## 2021-08-06 MED ORDER — SODIUM CHLORIDE 0.9 % IV SOLN
Freq: Once | INTRAVENOUS | Status: AC
  Filled 2021-08-06: qty 250

## 2021-08-06 NOTE — Telephone Encounter (Signed)
Copied from [place] [id number]. Topic: General - Other >> Aug 06, 2021 10:34 AM Valere Dross wrote: Reason for CRM: Pts wife called in stating she needed to speak with Baxter Flattery again, and asked if she could give her a call back, please advise.

## 2021-08-06 NOTE — Telephone Encounter (Signed)
Copied from [place] [id number]. Topic: General - Inquiry >> Aug 05, 2021  5:00 PM Loma Boston wrote: Pt wife, [name] wanting call back  Nurse, as pt meds refused due to labs. She feels that the blood work done when he has an infusion could be used for any existing labs, denied needing a lab appt or making lab appt. Pt states they are out of meds and we have all the blood we need. FU [phone]

## 2021-08-17 ENCOUNTER — Other Ambulatory Visit: Payer: Self-pay | Admitting: Family Medicine

## 2021-08-17 DIAGNOSIS — K219 Gastro-esophageal reflux disease without esophagitis: Secondary | ICD-10-CM

## 2021-08-18 NOTE — Telephone Encounter (Signed)
.   Requested Prescriptions  Pending Prescriptions Disp Refills  . omeprazole (PRILOSEC) 40 MG capsule [Pharmacy Med Name: OMEPRAZOLE 40MG  CAPSULES] 90 capsule 1    Sig: TAKE 1 CAPSULE BY MOUTH EVERY DAY     Gastroenterology: Proton Pump Inhibitors Passed - 08/17/2021  9:24 PM      Passed - Valid encounter within last 12 months    Recent Outpatient Visits          1 month ago Mixed hyperlipidemia   [facility] Juline Patch, MD   7 months ago Post-COVID chronic dyspnea   [facility] Juline Patch, MD   11 months ago Acute maxillary sinusitis, recurrence not specified   [facility] Juline Patch, MD   1 year ago Moderate episode of recurrent major depressive disorder [facility])   [facility] Juline Patch, MD   1 year ago Mixed hyperlipidemia   [facility] Juline Patch, MD      Future Appointments            In 1 month Earlie Server, MD [facility]

## 2021-08-26 ENCOUNTER — Ambulatory Visit (INDEPENDENT_AMBULATORY_CARE_PROVIDER_SITE_OTHER): Payer: Medicare Other | Admitting: Gastroenterology

## 2021-08-26 ENCOUNTER — Encounter: Payer: Self-pay | Admitting: Gastroenterology

## 2021-08-26 ENCOUNTER — Other Ambulatory Visit: Payer: Self-pay

## 2021-08-26 VITALS — BP 156/56 | HR 87 | Temp 97.5°F | Ht 70.0 in | Wt 212.8 lb

## 2021-08-26 DIAGNOSIS — D500 Iron deficiency anemia secondary to blood loss (chronic): Secondary | ICD-10-CM

## 2021-08-26 NOTE — H&P (View-Only) (Signed)
Gastroenterology Consultation  Referring Provider:     Earlie Server, MD Primary Care Physician:  Juline Patch, MD Primary Gastroenterologist:  Dr. Allen Norris     Reason for Consultation:     Anemia        HPI:   Jeremy [name] is a 69 y.o. y/o male referred for consultation & management of anemia by Dr. Juline Patch, MD. This patient comes to see me today after being seen by hematology for for anemia.  The patient was reported to have stage III kidney disease.  The patient was seen by me back in 2014 for anemia and had biopsies of the stomach that showed gastritis.  The patient's most recent blood work showed a haptoglobin of 22 with normal liver enzymes and his CBCs have shown:  Component     Latest Ref Rng & Units 03/15/2017 03/23/2018 07/06/2018 01/15/2020  Hemoglobin     13.0 - 17.0 g/dL 12.4 (L) 11.8 (L) 12.4 (L) 10.9 (L)  HCT     39.0 - 52.0 % 37.1 (L) 36.0 (L) 36.6 (L) 31.5 (L)  MCV     80.0 - 100.0 fL 94 95 94 92   Component     Latest Ref Rng & Units 07/07/2020 12/25/2020 06/18/2021 07/10/2021  Hemoglobin     13.0 - 17.0 g/dL 10.3 (L) 9.8 (L) 8.2 (L) 8.2 (L)  HCT     39.0 - 52.0 % 30.6 (L) 29.5 (L) 25.7 (L) 26.2 (L)  MCV     80.0 - 100.0 fL 99 (H) 97 103.6 (H) 102.7 (H)   The patient's iron studies have also been followed and they have shown:  Component     Latest Ref Rng & Units 06/18/2021 07/10/2021  Iron     45 - 182 ug/dL 36 (L) 24 (L)  TIBC     250 - 450 ug/dL 491 (H) 487 (H)  Saturation Ratios     17.9 - 39.5 % 7 (L) 5 (L)  UIBC     ug/dL 455 463    The patient's ferritin was 115.  The patient's B12 was not decreased but the last time I see a folic acid level checked was back in 2014. The patient denies seeing any blood in his stool and states that when he was taking iron he had black stools but that cleared up when he started getting iron infusions instead of taking iron pills.  The patient states that his hemoglobin wasn't responding to the iron infusion so he no  longer receive sign infusions.   Past Medical History:  Diagnosis Date   Chronic pain    Depression    Diabetes ([place])    GERD (gastroesophageal reflux disease)    Hyperlipemia    Hypertension    MS (multiple sclerosis) ([place])     Past Surgical History:  Procedure Laterality Date   COLECTOMY  06-2008   COLONOSCOPY  2015   normal    Prior to Admission medications   Medication Sig Start Date End Date Taking? Authorizing Provider  albuterol (VENTOLIN) (5 MG/ML) 0.5% NEBU Take by nebulization continuous.    [provider]  aspirin 81 MG tablet Take 81 mg by mouth daily.    [provider]  B-D INSULIN SYRINGE 1CC/25GX1" 25G X 1" 1 ML MISC USE AS DIRECTED 06/22/21   Juline Patch, MD  BD PEN NEEDLE NANO U/F 32G X 4 MM MISC  06/18/19   [provider]  COMBIVENT RESPIMAT 20-100  MCG/ACT AERS respimat Inhale 1 puff into the lungs 4 (four) times daily. 05/22/21   [provider]  cyanocobalamin (,VITAMIN B-12,) 1000 MCG/ML injection Administer every 14 days 06/30/21   Juline Patch, MD  ferrous sulfate (FEROSUL) 325 (65 FE) MG tablet TAKE 1 TABLET(325 MG) BY MOUTH THREE TIMES DAILY Patient not taking: Reported on 07/14/2021 06/30/21   Juline Patch, MD  furosemide (LASIX) 40 MG tablet Take 1 tablet (40 mg total) by mouth every other day. Patient taking differently: Take 20 mg by mouth every other day. 07/07/20   Juline Patch, MD  gemfibrozil (LOPID) 600 MG tablet TAKE 1 TABLET(600 MG) BY MOUTH TWICE DAILY BEFORE A MEAL 06/30/21   Juline Patch, MD  Insulin Glargine (LANTUS) 100 UNIT/ML Solostar Pen Inject 32 Units into the skin daily. Dr Honor Junes 09/06/16   [provider]  Interferon Beta-1b (BETASERON) 0.3 MG KIT injection Inject subcutaneously 1 syringe every other day. 05/06/20   Marcial Pacas, MD  loratadine (CLARITIN) 10 MG tablet Take 1 tablet (10 mg total) by mouth daily. 06/30/21   Juline Patch, MD  losartan (COZAAR) 25 MG tablet Take 1  tablet by mouth daily. singh 11/26/19 07/14/21  [provider]  metFORMIN (GLUCOPHAGE) 500 MG tablet Take 1 tablet by mouth 2 (two) times daily. 09/08/17   [provider]  omeprazole (PRILOSEC) 40 MG capsule TAKE 1 CAPSULE BY MOUTH EVERY DAY 08/18/21   Juline Patch, MD  [facility] ULTRA test strip TEST TID 08/28/19   [provider]  pregabalin (LYRICA) 200 MG capsule Take 1 capsule (200 mg total) by mouth 2 (two) times daily. 06/30/21   Juline Patch, MD  sertraline (ZOLOFT) 50 MG tablet Take 1/2 tablet daily 06/30/21   Juline Patch, MD  simvastatin (ZOCOR) 40 MG tablet Take 1 tablet (40 mg total) by mouth daily. 08/06/21   Juline Patch, MD  sodium bicarbonate 650 MG tablet Take 650 mg by mouth 2 (two) times daily. 06/10/21   [provider]  traMADol (ULTRAM) 50 MG tablet Take 1 tablet (50 mg total) by mouth every 6 (six) hours as needed. 07/15/21   Marcial Pacas, MD    Family History  Problem Relation Age of Onset   Lung cancer Mother    Heart attack Father    Heart attack Brother    COPD Brother    Diabetes Brother    Esophageal cancer Nephew      Social History   Tobacco Use   Smoking status: Former    Packs/day: 1.50    Years: 30.00    Pack years: 45.00    Types: Cigarettes    Quit date: 01/23/2006    Years since quitting: 15.6   Smokeless tobacco: Never   Tobacco comments:    N/A  Vaping Use   Vaping Use: Never used  Substance Use Topics   Alcohol use: Yes    Alcohol/week: 0.0 standard drinks    Comment: rare; maybe 2 beers a year   Drug use: No    Allergies as of 08/26/2021 - Review Complete 07/14/2021  Allergen Reaction Noted   Betadine [povidone iodine]  07/21/2013    Review of Systems:    All systems reviewed and negative except where noted in HPI.   Physical Exam:  There were no vitals taken for this visit. No LMP for male patient. General:   Alert,  Well-developed, frail, pleasant and cooperative in NAD Head:   Normocephalic  and atraumatic. Eyes:  Sclera clear, no icterus.   Conjunctiva Past. Ears:  Normal auditory acuity. Neck:  Supple; no masses or thyromegaly. Lungs:  Respirations even and unlabored.  Clear throughout to auscultation.   No wheezes, crackles, or rhonchi. No acute distress. Heart:  Regular rate and rhythm; no murmurs, clicks, rubs, or gallops. Abdomen:  Normal bowel sounds.  No bruits.  Soft, non-tender and non-distended without masses, hepatosplenomegaly or hernias noted.  No guarding or rebound tenderness.  Negative Carnett sign.   Rectal:  Deferred.  Pulses:  Normal pulses noted. Extremities:  No clubbing or edema.  No cyanosis. Neurologic:  Alert and oriented x3;  grossly normal neurologically. Skin:  Intact without significant lesions or rashes.  No jaundice. Lymph Nodes:  No significant cervical adenopathy. Psych:  Alert and cooperative. Normal mood and affect.  Imaging Studies: No results found.  Assessment and Plan:   Jeremy [name] is a 70 y.o. y/o male who comes in today with iron deficiency anemia.  The patient denies any sign of gross blood loss but has had blood work showing significant anemia. Due to the patient's anemia the patient will be set up for an EGD and colonoscopy to rule out a GI source of blood loss.  If this is negative the patient will have a capsule endoscopy to evaluate the small intestines. The patient and his wife have been explained the plan and agree with it.    [name], MD. Marval Regal    Note: This dictation was prepared with Dragon dictation along with smaller phrase technology. Any transcriptional errors that result from this process are unintentional.

## 2021-08-26 NOTE — Progress Notes (Signed)
Gastroenterology Consultation  Referring Provider:     Earlie Server, MD Primary Care Physician:  Juline Patch, MD Primary Gastroenterologist:  Dr. Allen Norris     Reason for Consultation:     Anemia        HPI:   [name] is a 69 y.o. y/o male referred for consultation & management of anemia by Dr. Juline Patch, MD. This patient comes to see me today after being seen by hematology for for anemia.  The patient was reported to have stage III kidney disease.  The patient was seen by me back in 2014 for anemia and had biopsies of the stomach that showed gastritis.  The patient's most recent blood work showed a haptoglobin of 22 with normal liver enzymes and his CBCs have shown:  Component     Latest Ref Rng & Units 03/15/2017 03/23/2018 07/06/2018 01/15/2020  Hemoglobin     13.0 - 17.0 g/dL 12.4 (L) 11.8 (L) 12.4 (L) 10.9 (L)  HCT     39.0 - 52.0 % 37.1 (L) 36.0 (L) 36.6 (L) 31.5 (L)  MCV     80.0 - 100.0 fL 94 95 94 92   Component     Latest Ref Rng & Units 07/07/2020 12/25/2020 06/18/2021 07/10/2021  Hemoglobin     13.0 - 17.0 g/dL 10.3 (L) 9.8 (L) 8.2 (L) 8.2 (L)  HCT     39.0 - 52.0 % 30.6 (L) 29.5 (L) 25.7 (L) 26.2 (L)  MCV     80.0 - 100.0 fL 99 (H) 97 103.6 (H) 102.7 (H)   The patient's iron studies have also been followed and they have shown:  Component     Latest Ref Rng & Units 06/18/2021 07/10/2021  Iron     45 - 182 ug/dL 36 (L) 24 (L)  TIBC     250 - 450 ug/dL 491 (H) 487 (H)  Saturation Ratios     17.9 - 39.5 % 7 (L) 5 (L)  UIBC     ug/dL 455 463    The patient's ferritin was 115.  The patient's B12 was not decreased but the last time I see a folic acid level checked was back in 2014. The patient denies seeing any blood in his stool and states that when he was taking iron he had black stools but that cleared up when he started getting iron infusions instead of taking iron pills.  The patient states that his hemoglobin wasn't responding to the iron infusion so he no  longer receive sign infusions.   Past Medical History:  Diagnosis Date   Chronic pain    Depression    Diabetes ([place])    GERD (gastroesophageal reflux disease)    Hyperlipemia    Hypertension    MS (multiple sclerosis) ([place])     Past Surgical History:  Procedure Laterality Date   COLECTOMY  06-2008   COLONOSCOPY  2015   normal    Prior to Admission medications   Medication Sig Start Date End Date Taking? Authorizing Provider  albuterol (VENTOLIN) (5 MG/ML) 0.5% NEBU Take by nebulization continuous.    [provider]  aspirin 81 MG tablet Take 81 mg by mouth daily.    [provider]  B-D INSULIN SYRINGE 1CC/25GX1" 25G X 1" 1 ML MISC USE AS DIRECTED 06/22/21   Juline Patch, MD  BD PEN NEEDLE NANO U/F 32G X 4 MM MISC  06/18/19   [provider]  COMBIVENT RESPIMAT 20-100  MCG/ACT AERS respimat Inhale 1 puff into the lungs 4 (four) times daily. 05/22/21   [provider]  cyanocobalamin (,VITAMIN B-12,) 1000 MCG/ML injection Administer every 14 days 06/30/21   Juline Patch, MD  ferrous sulfate (FEROSUL) 325 (65 FE) MG tablet TAKE 1 TABLET(325 MG) BY MOUTH THREE TIMES DAILY Patient not taking: Reported on 07/14/2021 06/30/21   Juline Patch, MD  furosemide (LASIX) 40 MG tablet Take 1 tablet (40 mg total) by mouth every other day. Patient taking differently: Take 20 mg by mouth every other day. 07/07/20   Juline Patch, MD  gemfibrozil (LOPID) 600 MG tablet TAKE 1 TABLET(600 MG) BY MOUTH TWICE DAILY BEFORE A MEAL 06/30/21   Juline Patch, MD  Insulin Glargine (LANTUS) 100 UNIT/ML Solostar Pen Inject 32 Units into the skin daily. Dr Honor Junes 09/06/16   [provider]  Interferon Beta-1b (BETASERON) 0.3 MG KIT injection Inject subcutaneously 1 syringe every other day. 05/06/20   Marcial Pacas, MD  loratadine (CLARITIN) 10 MG tablet Take 1 tablet (10 mg total) by mouth daily. 06/30/21   Juline Patch, MD  losartan (COZAAR) 25 MG tablet Take 1  tablet by mouth daily. singh 11/26/19 07/14/21  [provider]  metFORMIN (GLUCOPHAGE) 500 MG tablet Take 1 tablet by mouth 2 (two) times daily. 09/08/17   [provider]  omeprazole (PRILOSEC) 40 MG capsule TAKE 1 CAPSULE BY MOUTH EVERY DAY 08/18/21   Juline Patch, MD  [facility] ULTRA test strip TEST TID 08/28/19   [provider]  pregabalin (LYRICA) 200 MG capsule Take 1 capsule (200 mg total) by mouth 2 (two) times daily. 06/30/21   Juline Patch, MD  sertraline (ZOLOFT) 50 MG tablet Take 1/2 tablet daily 06/30/21   Juline Patch, MD  simvastatin (ZOCOR) 40 MG tablet Take 1 tablet (40 mg total) by mouth daily. 08/06/21   Juline Patch, MD  sodium bicarbonate 650 MG tablet Take 650 mg by mouth 2 (two) times daily. 06/10/21   [provider]  traMADol (ULTRAM) 50 MG tablet Take 1 tablet (50 mg total) by mouth every 6 (six) hours as needed. 07/15/21   Marcial Pacas, MD    Family History  Problem Relation Age of Onset   Lung cancer Mother    Heart attack Father    Heart attack Brother    COPD Brother    Diabetes Brother    Esophageal cancer Nephew      Social History   Tobacco Use   Smoking status: Former    Packs/day: 1.50    Years: 30.00    Pack years: 45.00    Types: Cigarettes    Quit date: 01/23/2006    Years since quitting: 15.6   Smokeless tobacco: Never   Tobacco comments:    N/A  Vaping Use   Vaping Use: Never used  Substance Use Topics   Alcohol use: Yes    Alcohol/week: 0.0 standard drinks    Comment: rare; maybe 2 beers a year   Drug use: No    Allergies as of 08/26/2021 - Review Complete 07/14/2021  Allergen Reaction Noted   Betadine [povidone iodine]  07/21/2013    Review of Systems:    All systems reviewed and negative except where noted in HPI.   Physical Exam:  There were no vitals taken for this visit. No LMP for male patient. General:   Alert,  Well-developed, frail, pleasant and cooperative in NAD Head:   Normocephalic  and atraumatic. Eyes:  Sclera clear, no icterus.   Conjunctiva Past. Ears:  Normal auditory acuity. Neck:  Supple; no masses or thyromegaly. Lungs:  Respirations even and unlabored.  Clear throughout to auscultation.   No wheezes, crackles, or rhonchi. No acute distress. Heart:  Regular rate and rhythm; no murmurs, clicks, rubs, or gallops. Abdomen:  Normal bowel sounds.  No bruits.  Soft, non-tender and non-distended without masses, hepatosplenomegaly or hernias noted.  No guarding or rebound tenderness.  Negative Carnett sign.   Rectal:  Deferred.  Pulses:  Normal pulses noted. Extremities:  No clubbing or edema.  No cyanosis. Neurologic:  Alert and oriented x3;  grossly normal neurologically. Skin:  Intact without significant lesions or rashes.  No jaundice. Lymph Nodes:  No significant cervical adenopathy. Psych:  Alert and cooperative. Normal mood and affect.  Imaging Studies: No results found.  Assessment and Plan:   Jeremy [name] is a 70 y.o. y/o male who comes in today with iron deficiency anemia.  The patient denies any sign of gross blood loss but has had blood work showing significant anemia. Due to the patient's anemia the patient will be set up for an EGD and colonoscopy to rule out a GI source of blood loss.  If this is negative the patient will have a capsule endoscopy to evaluate the small intestines. The patient and his wife have been explained the plan and agree with it.    [name], MD. Marval Regal    Note: This dictation was prepared with Dragon dictation along with smaller phrase technology. Any transcriptional errors that result from this process are unintentional.

## 2021-08-28 ENCOUNTER — Telehealth: Payer: Self-pay | Admitting: Gastroenterology

## 2021-08-28 NOTE — Telephone Encounter (Signed)
Pt

## 2021-08-28 NOTE — Telephone Encounter (Signed)
Pt. Calling to set up Endoscopy and Colonoscopy appointment.

## 2021-09-01 NOTE — Telephone Encounter (Signed)
Pt. Calling to set up Endoscopy and Colonoscopy Procedure. Stated that the [facility] informed him.

## 2021-09-02 ENCOUNTER — Other Ambulatory Visit: Payer: Self-pay

## 2021-09-02 ENCOUNTER — Telehealth: Payer: Self-pay | Admitting: Neurology

## 2021-09-02 DIAGNOSIS — D500 Iron deficiency anemia secondary to blood loss (chronic): Secondary | ICD-10-CM

## 2021-09-02 MED ORDER — NA SULFATE-K SULFATE-MG SULF 17.5-3.13-1.6 GM/177ML PO SOLN
1.0000 | Freq: Once | ORAL | 0 refills | Status: AC
Start: 2021-09-02 — End: 2021-09-02

## 2021-09-02 NOTE — Telephone Encounter (Signed)
Pt scheduled for Colonoscopy and EGD with [facility] at [facility] on 09/10/21.

## 2021-09-02 NOTE — Telephone Encounter (Addendum)
Prescription for Tramadol 50mg , #120 x 5 sent to [facility] on 07/15/21. We do not send 90-day, as needed, controlled prescriptions to mail order. His wife is aware of this information.

## 2021-09-02 NOTE — Telephone Encounter (Signed)
Pt's wife requesting refill for traMADol (ULTRAM) 50 MG tablet. Pharmacy [facility] ([place], [place].

## 2021-09-09 ENCOUNTER — Telehealth: Payer: Self-pay | Admitting: *Deleted

## 2021-09-09 NOTE — Telephone Encounter (Signed)
Received 2023 renewal for patient assistance program for Betaseron.   [employer] [place] [employer] Ph: [phone] Fax: [phone]   Healthcare section/prescription completed, signed by Dr. Krista Blue and faxed back. His medication list and insurance cards were attached. The patient portion was also included.   Copy of application mailed to patient.

## 2021-09-10 ENCOUNTER — Telehealth: Payer: Self-pay | Admitting: Oncology

## 2021-09-10 ENCOUNTER — Ambulatory Visit: Payer: Medicare Other | Admitting: Anesthesiology

## 2021-09-10 ENCOUNTER — Encounter
Admission: RE | Disposition: A | Discharge status: Home / Self Care | Payer: Self-pay | Source: Home / Self Care | Attending: Gastroenterology

## 2021-09-10 ENCOUNTER — Telehealth: Payer: Self-pay

## 2021-09-10 ENCOUNTER — Ambulatory Visit
Admission: RE | Admit: 2021-09-10 | Discharge: 2021-09-10 | Disposition: A | Discharge status: Home / Self Care | Payer: Medicare Other | Attending: Gastroenterology | Admitting: Gastroenterology

## 2021-09-10 DIAGNOSIS — E785 Hyperlipidemia, unspecified: Secondary | ICD-10-CM | POA: Diagnosis not present

## 2021-09-10 DIAGNOSIS — K648 Other hemorrhoids: Secondary | ICD-10-CM | POA: Diagnosis not present

## 2021-09-10 DIAGNOSIS — K317 Polyp of stomach and duodenum: Secondary | ICD-10-CM

## 2021-09-10 DIAGNOSIS — D509 Iron deficiency anemia, unspecified: Secondary | ICD-10-CM | POA: Insufficient documentation

## 2021-09-10 DIAGNOSIS — D500 Iron deficiency anemia secondary to blood loss (chronic): Secondary | ICD-10-CM | POA: Diagnosis not present

## 2021-09-10 DIAGNOSIS — K449 Diaphragmatic hernia without obstruction or gangrene: Secondary | ICD-10-CM | POA: Insufficient documentation

## 2021-09-10 DIAGNOSIS — K2970 Gastritis, unspecified, without bleeding: Secondary | ICD-10-CM

## 2021-09-10 DIAGNOSIS — K2950 Unspecified chronic gastritis without bleeding: Secondary | ICD-10-CM | POA: Insufficient documentation

## 2021-09-10 DIAGNOSIS — K2960 Other gastritis without bleeding: Secondary | ICD-10-CM | POA: Diagnosis not present

## 2021-09-10 HISTORY — PX: COLONOSCOPY WITH PROPOFOL: SHX5780

## 2021-09-10 HISTORY — PX: ESOPHAGOGASTRODUODENOSCOPY (EGD) WITH PROPOFOL: SHX5813

## 2021-09-10 LAB — GLUCOSE, CAPILLARY: Glucose-Capillary: 98 mg/dL (ref 70–99)

## 2021-09-10 SURGERY — COLONOSCOPY WITH PROPOFOL
Anesthesia: General

## 2021-09-10 MED ORDER — SODIUM CHLORIDE 0.9 % IV SOLN
INTRAVENOUS | Status: DC
  Administered 2021-09-10: 07:00:00 20 mL/h via INTRAVENOUS

## 2021-09-10 MED ORDER — PHENYLEPHRINE HCL (PRESSORS) 10 MG/ML IV SOLN
INTRAVENOUS | Status: DC | PRN
  Administered 2021-09-10 (×3): 160 ug via INTRAVENOUS

## 2021-09-10 MED ORDER — LIDOCAINE HCL (CARDIAC) PF 100 MG/5ML IV SOSY
PREFILLED_SYRINGE | INTRAVENOUS | Status: DC | PRN
  Administered 2021-09-10: 40 mg via INTRAVENOUS

## 2021-09-10 MED ORDER — PROPOFOL 500 MG/50ML IV EMUL
INTRAVENOUS | Status: DC | PRN
  Administered 2021-09-10: 150 ug/kg/min via INTRAVENOUS

## 2021-09-10 MED ORDER — PROPOFOL 500 MG/50ML IV EMUL
INTRAVENOUS | Status: AC
  Filled 2021-09-10: qty 50

## 2021-09-10 MED ORDER — PROPOFOL 10 MG/ML IV BOLUS
INTRAVENOUS | Status: DC | PRN
  Administered 2021-09-10: 70 mg via INTRAVENOUS

## 2021-09-10 MED ORDER — LIDOCAINE HCL (PF) 2 % IJ SOLN
INTRAMUSCULAR | Status: AC
  Filled 2021-09-10: qty 5

## 2021-09-10 NOTE — Telephone Encounter (Signed)
Patient had colonoscpy this morning. Phone note in chart where pt's wife contacted [facility] regarding "loose stools that looked dark/black -not tarry". They were advied to proceed to ED if sx persist or worsen.   Please advise

## 2021-09-10 NOTE — Anesthesia Postprocedure Evaluation (Signed)
Anesthesia Post Note  Patient: Jeremy [name]  Procedure(s) Performed: COLONOSCOPY WITH PROPOFOL ESOPHAGOGASTRODUODENOSCOPY (EGD) WITH PROPOFOL  Patient location during evaluation: Phase II Anesthesia Type: General Level of consciousness: awake and alert, awake and oriented Pain management: pain level controlled Vital Signs Assessment: post-procedure vital signs reviewed and stable Respiratory status: spontaneous breathing, nonlabored ventilation and respiratory function stable Cardiovascular status: blood pressure returned to baseline and stable Postop Assessment: no apparent nausea or vomiting Anesthetic complications: no   No notable events documented.   Last Vitals:  Vitals:   09/10/21 0819 09/10/21 0835  BP: (!) 109/45 (!) 112/47  Pulse:    Resp:    Temp:    SpO2:      Last Pain:  Vitals:   09/10/21 0835  TempSrc:   PainSc: 0-No pain                 Phill Mutter

## 2021-09-10 NOTE — Anesthesia Preprocedure Evaluation (Addendum)
Anesthesia Evaluation  Patient identified by MRN, date of birth, ID band Patient awake    Reviewed: Allergy & Precautions, H&P , NPO status , Patient's Chart, lab work & pertinent test results  Airway Mallampati: II  TM Distance: >3 FB Neck ROM: Full    Dental  (+) Poor Dentition   Pulmonary neg pulmonary ROS, former smoker,    Pulmonary exam normal        Cardiovascular hypertension, Pt. on medications negative cardio ROS Normal cardiovascular exam     Neuro/Psych PSYCHIATRIC DISORDERS Depression  Neuromuscular disease    GI/Hepatic Neg liver ROS, Bowel prep,GERD  Medicated,  Endo/Other  negative endocrine ROSdiabetes  Renal/GU Renal disease  negative genitourinary   Musculoskeletal negative musculoskeletal ROS (+)   Abdominal   Peds negative pediatric ROS (+)  Hematology negative hematology ROS (+) anemia ,   Anesthesia Other Findings Chronic pain    Depression    Diabetes (HCC)    GERD (gastroesophageal reflux disease)   Hyperlipemia    Hypertension    MS (multiple sclerosis) ([facility])       Reproductive/Obstetrics negative OB ROS                           Anesthesia Physical Anesthesia Plan  ASA: 3  Anesthesia Plan: General   Post-op Pain Management:    Induction: Inhalational  PONV Risk Score and Plan: 2 and Propofol infusion and TIVA  Airway Management Planned: Natural Airway and Nasal Cannula  Additional Equipment:   Intra-op Plan:   Post-operative Plan:   Informed Consent: I have reviewed the patients History and Physical, chart, labs and discussed the procedure including the risks, benefits and alternatives for the proposed anesthesia with the patient or authorized representative who has indicated his/her understanding and acceptance.       Plan Discussed with: CRNA, Anesthesiologist and Surgeon  Anesthesia Plan Comments:         Anesthesia Quick  Evaluation

## 2021-09-10 NOTE — Op Note (Addendum)
[facility] Gastroenterology Patient Name: Jeremy [name] Procedure Date: 09/10/2021 7:37 AM MRN: [id number] Account #: [id number] Date of Birth: October 11, 1951 Admit Type: Outpatient Age: 70 Room: [facility] ENDO ROOM 4 Gender: Male Note Status: Finalized Instrument Name: Jasper Riling [id number] Procedure:             Colonoscopy Indications:           Iron deficiency anemia Providers:             [name] MD, MD Referring MD:          Juline Patch, MD (Referring MD) Medicines:             Propofol per Anesthesia Complications:         No immediate complications. Procedure:             Pre-Anesthesia Assessment:                        - Prior to the procedure, a History and Physical was                         performed, and patient medications and allergies were                         reviewed. The patient's tolerance of previous                         anesthesia was also reviewed. The risks and benefits                         of the procedure and the sedation options and risks                         were discussed with the patient. All questions were                         answered, and informed consent was obtained. Prior                         Anticoagulants: The patient has taken no previous                         anticoagulant or antiplatelet agents. ASA Grade                         Assessment: II - A patient with mild systemic disease.                         After reviewing the risks and benefits, the patient                         was deemed in satisfactory condition to undergo the                         procedure.                        After obtaining informed consent, the colonoscope was  passed under direct vision. Throughout the procedure,                         the patient's blood pressure, pulse, and oxygen                         saturations were monitored continuously. The                         Colonoscope was  introduced through the anus and                         advanced to the the ileocolonic anastomosis. The                         colonoscopy was performed without difficulty. The                         patient tolerated the procedure well. The quality of                         the bowel preparation was excellent. Findings:      The perianal and digital rectal examinations were normal.      There was evidence of a prior end-to-end colo-colonic anastomosis in the       transverse colon. This was patent.      Non-bleeding internal hemorrhoids were found during retroflexion. The       hemorrhoids were Grade I (internal hemorrhoids that do not prolapse). Impression:            - Patent end-to-end colo-colonic anastomosis.                        - Non-bleeding internal hemorrhoids.                        - No specimens collected. Recommendation:        - Discharge patient to home.                        - Resume previous diet.                        - Continue present medications.                        - To visualize the small bowel, perform video capsule                         endoscopy. Procedure Code(s):     --- Professional ---                        [phone], Colonoscopy, flexible; diagnostic, including                         collection of specimen(s) by brushing or washing, when                         performed (separate procedure) Diagnosis Code(s):     --- Professional ---  D50.9, Iron deficiency anemia, unspecified CPT copyright 2019 American Medical Association. All rights reserved. The codes documented in this report are preliminary and upon coder review may  be revised to meet current compliance requirements. [name] MD, MD 09/10/2021 8:01:41 AM This report has been signed electronically. Number of Addenda: 0 Note Initiated On: 09/10/2021 7:37 AM Scope Withdrawal Time: 0 hours 4 minutes 37 seconds  Total Procedure Duration: 0 hours 6 minutes 8  seconds  Estimated Blood Loss:  Estimated blood loss: none.      [facility]

## 2021-09-10 NOTE — Transfer of Care (Signed)
Immediate Anesthesia Transfer of Care Note  Patient: Jeremy [name]  Procedure(s) Performed: Procedure(s): COLONOSCOPY WITH PROPOFOL (N/A) ESOPHAGOGASTRODUODENOSCOPY (EGD) WITH PROPOFOL (N/A)  Patient Location: PACU and Endoscopy Unit  Anesthesia Type:General  Level of Consciousness: sedated  Airway & Oxygen Therapy: Patient Spontanous Breathing and Patient connected to nasal cannula oxygen  Post-op Assessment: Report given to RN and Post -op Vital signs reviewed and stable  Post vital signs: Reviewed and stable  Last Vitals:  Vitals:   09/10/21 0805 09/10/21 0809  BP: (!) 85/47 (!) 120/51  Pulse:    Resp:    Temp: (!) 35.6 C   SpO2:      Complications: No apparent anesthesia complications

## 2021-09-10 NOTE — Telephone Encounter (Signed)
I spoke to spouse [name] ok ayper DPR... She reports pt went home and ate, shortly after he had loose stools that looked dark/black--not tarry... Pt denies any other Sx or concerns. Per verbal orders... Pt advised to pick back up on clear liquid diet and if Sx persist or worsen to go to ED and let us know

## 2021-09-10 NOTE — Interval H&P Note (Signed)
[name], MD [address] [place], [place] [address] [phone] Fax : [phone]  Primary Care Physician:  Juline Patch, MD Primary Gastroenterologist:  Dr. Allen Norris  Pre-Procedure History & Physical: HPI:  Jeremy [name] is a 70 y.o. male is here for an endoscopy and colonoscopy.   Past Medical History:  Diagnosis Date   Chronic pain    Depression    Diabetes ([place])    GERD (gastroesophageal reflux disease)    Hyperlipemia    Hypertension    MS (multiple sclerosis) ([place])     Past Surgical History:  Procedure Laterality Date   COLECTOMY  06-2008   COLONOSCOPY  2015   normal    Prior to Admission medications   Medication Sig Start Date End Date Taking? Authorizing Provider  albuterol (VENTOLIN) (5 MG/ML) 0.5% NEBU Take by nebulization continuous.   Yes [provider]  aspirin 81 MG tablet Take 81 mg by mouth daily.   Yes [provider]  B-D INSULIN SYRINGE 1CC/25GX1" 25G X 1" 1 ML MISC USE AS DIRECTED 06/22/21  Yes Juline Patch, MD  BD PEN NEEDLE NANO U/F 32G X 4 MM MISC  06/18/19  Yes [provider]  COMBIVENT RESPIMAT 20-100 MCG/ACT AERS respimat Inhale 1 puff into the lungs 4 (four) times daily. 05/22/21  Yes [provider]  cyanocobalamin (,VITAMIN B-12,) 1000 MCG/ML injection Administer every 14 days 06/30/21  Yes Juline Patch, MD  furosemide (LASIX) 40 MG tablet Take 1 tablet (40 mg total) by mouth every other day. Patient taking differently: Take 20 mg by mouth every other day. 07/07/20  Yes Otilio Miu C, MD  gemfibrozil (LOPID) 600 MG tablet TAKE 1 TABLET(600 MG) BY MOUTH TWICE DAILY BEFORE A MEAL 06/30/21  Yes Juline Patch, MD  Insulin Glargine (LANTUS) 100 UNIT/ML Solostar Pen Inject 32 Units into the skin daily. Dr Honor Junes 09/06/16  Yes [provider]  Interferon Beta-1b (BETASERON) 0.3 MG KIT injection Inject subcutaneously 1 syringe every other day. 05/06/20  Yes Marcial Pacas, MD   loratadine (CLARITIN) 10 MG tablet Take 1 tablet (10 mg total) by mouth daily. 06/30/21  Yes Juline Patch, MD  metFORMIN (GLUCOPHAGE) 500 MG tablet Take 1 tablet by mouth 2 (two) times daily. 09/08/17  Yes [provider]  omeprazole (PRILOSEC) 40 MG capsule TAKE 1 CAPSULE BY MOUTH EVERY DAY 08/18/21  Yes Juline Patch, MD  [facility] ULTRA test strip TEST TID 08/28/19  Yes [provider]  pregabalin (LYRICA) 200 MG capsule Take 1 capsule (200 mg total) by mouth 2 (two) times daily. 06/30/21  Yes Juline Patch, MD  sertraline (ZOLOFT) 50 MG tablet Take 1/2 tablet daily 06/30/21  Yes Juline Patch, MD  simvastatin (ZOCOR) 40 MG tablet Take 1 tablet (40 mg total) by mouth daily. 08/06/21  Yes Juline Patch, MD  sodium bicarbonate 650 MG tablet Take 650 mg by mouth 2 (two) times daily. 06/10/21  Yes [provider]  traMADol (ULTRAM) 50 MG tablet Take 1 tablet (50 mg total) by mouth every 6 (six) hours as needed. 07/15/21  Yes Marcial Pacas, MD  ferrous sulfate (FEROSUL) 325 (65 FE) MG tablet TAKE 1 TABLET(325 MG) BY MOUTH THREE TIMES DAILY Patient not taking: No sig reported 06/30/21   Juline Patch, MD  losartan (COZAAR) 25 MG tablet Take 1 tablet by mouth daily. singh 11/26/19 07/14/21  [provider]    Allergies as of 09/02/2021 -  Review Complete 08/26/2021  Allergen Reaction Noted   Betadine [povidone iodine]  07/21/2013    Family History  Problem Relation Age of Onset   Lung cancer Mother    Heart attack Father    Heart attack Brother    COPD Brother    Diabetes Brother    Esophageal cancer Nephew     Social History   Socioeconomic History   Marital status: Married    Spouse name: [name]   Number of children: 2   Years of education: GED   Highest education level: Not on file  Occupational History    Comment: Disabled  Tobacco Use   Smoking status: Former    Packs/day: 1.50    Years: 30.00    Pack years: 45.00    Types: Cigarettes     Quit date: 01/23/2006    Years since quitting: 15.6   Smokeless tobacco: Never   Tobacco comments:    N/A  Vaping Use   Vaping Use: Never used  Substance and Sexual Activity   Alcohol use: Yes    Alcohol/week: 0.0 standard drinks    Comment: rare; maybe 2 beers a year   Drug use: No   Sexual activity: Not Currently  Other Topics Concern   Not on file  Social History Narrative   Patient is disabled.    Patient lives with his wife Mikolaj Woolstenhulme.    Patient has 2 children.       Social Determinants of Health   Financial Resource Strain: Low Risk    Difficulty of Paying Living Expenses: Not hard at all  Food Insecurity: No Food Insecurity   Worried About Charity fundraiser in the Last Year: Never true   [place] in the Last Year: Never true  Transportation Needs: No Transportation Needs   Lack of Transportation (Medical): No   Lack of Transportation (Non-Medical): No  Physical Activity: Inactive   Days of Exercise per Week: 0 days   Minutes of Exercise per Session: 0 min  Stress: No Stress Concern Present   Feeling of Stress : Not at all  Social Connections: Moderately Isolated   Frequency of Communication with Friends and Family: More than three times a week   Frequency of Social Gatherings with Friends and Family: Once a week   Attends Religious Services: Never   Marine scientist or Organizations: No   Attends Music therapist: Never   Marital Status: Married  Human resources officer Violence: Not At Risk   Fear of Current or Ex-Partner: No   Emotionally Abused: No   Physically Abused: No   Sexually Abused: No    Review of Systems: See HPI, otherwise negative ROS  Physical Exam: BP 137/62   Pulse 79   Temp (!) 96.2 F (35.7 C) (Temporal)   Resp 20   Ht 5' 10" (1.778 m)   Wt 96.2 kg   SpO2 96%   BMI 30.42 kg/m  General:   Alert,  pleasant and cooperative in NAD Head:  Normocephalic and atraumatic. Neck:  Supple; no masses or  thyromegaly. Lungs:  Clear throughout to auscultation.    Heart:  Regular rate and rhythm. Abdomen:  Soft, nontender and nondistended. Normal bowel sounds, without guarding, and without rebound.   Neurologic:  Alert and  oriented x4;  grossly normal neurologically.  Impression/Plan: Jeremy [name] is here for an endoscopy and colonoscopy to be performed for IDA  Risks, benefits, limitations, and alternatives regarding  endoscopy and  colonoscopy have been reviewed with the patient.  Questions have been answered.  All parties agreeable.   [name], MD  09/10/2021, 7:32 AM

## 2021-09-10 NOTE — Interval H&P Note (Signed)
Jeremy Lame, MD [name] [name], [name] [address] [phone] Fax : [phone]  Primary Care Physician:  Juline Patch, MD Primary Gastroenterologist:  Dr. Allen Norris  Pre-Procedure History & Physical: HPI:  Jeremy [name] is a 70 y.o. male is here for an endoscopy and colonoscopy.   Past Medical History:  Diagnosis Date   Chronic pain    Depression    Diabetes ([place])    GERD (gastroesophageal reflux disease)    Hyperlipemia    Hypertension    MS (multiple sclerosis) ([place])     Past Surgical History:  Procedure Laterality Date   COLECTOMY  06-2008   COLONOSCOPY  2015   normal    Prior to Admission medications   Medication Sig Start Date End Date Taking? Authorizing Provider  albuterol (VENTOLIN) (5 MG/ML) 0.5% NEBU Take by nebulization continuous.   Yes [provider]  aspirin 81 MG tablet Take 81 mg by mouth daily.   Yes [provider]  B-D INSULIN SYRINGE 1CC/25GX1" 25G X 1" 1 ML MISC USE AS DIRECTED 06/22/21  Yes Juline Patch, MD  BD PEN NEEDLE NANO U/F 32G X 4 MM MISC  06/18/19  Yes [provider]  COMBIVENT RESPIMAT 20-100 MCG/ACT AERS respimat Inhale 1 puff into the lungs 4 (four) times daily. 05/22/21  Yes [provider]  cyanocobalamin (,VITAMIN B-12,) 1000 MCG/ML injection Administer every 14 days 06/30/21  Yes Juline Patch, MD  furosemide (LASIX) 40 MG tablet Take 1 tablet (40 mg total) by mouth every other day. Patient taking differently: Take 20 mg by mouth every other day. 07/07/20  Yes Otilio Miu C, MD  gemfibrozil (LOPID) 600 MG tablet TAKE 1 TABLET(600 MG) BY MOUTH TWICE DAILY BEFORE A MEAL 06/30/21  Yes Juline Patch, MD  Insulin Glargine (LANTUS) 100 UNIT/ML Solostar Pen Inject 32 Units into the skin daily. Dr Honor Junes 09/06/16  Yes [provider]  Interferon Beta-1b (BETASERON) 0.3 MG KIT injection Inject subcutaneously 1 syringe every other day. 05/06/20  Yes Marcial Pacas, MD   loratadine (CLARITIN) 10 MG tablet Take 1 tablet (10 mg total) by mouth daily. 06/30/21  Yes Juline Patch, MD  metFORMIN (GLUCOPHAGE) 500 MG tablet Take 1 tablet by mouth 2 (two) times daily. 09/08/17  Yes [provider]  omeprazole (PRILOSEC) 40 MG capsule TAKE 1 CAPSULE BY MOUTH EVERY DAY 08/18/21  Yes Juline Patch, MD  [facility] ULTRA test strip TEST TID 08/28/19  Yes [provider]  pregabalin (LYRICA) 200 MG capsule Take 1 capsule (200 mg total) by mouth 2 (two) times daily. 06/30/21  Yes Juline Patch, MD  sertraline (ZOLOFT) 50 MG tablet Take 1/2 tablet daily 06/30/21  Yes Juline Patch, MD  simvastatin (ZOCOR) 40 MG tablet Take 1 tablet (40 mg total) by mouth daily. 08/06/21  Yes Juline Patch, MD  sodium bicarbonate 650 MG tablet Take 650 mg by mouth 2 (two) times daily. 06/10/21  Yes [provider]  traMADol (ULTRAM) 50 MG tablet Take 1 tablet (50 mg total) by mouth every 6 (six) hours as needed. 07/15/21  Yes Marcial Pacas, MD  ferrous sulfate (FEROSUL) 325 (65 FE) MG tablet TAKE 1 TABLET(325 MG) BY MOUTH THREE TIMES DAILY Patient not taking: No sig reported 06/30/21   Juline Patch, MD  losartan (COZAAR) 25 MG tablet Take 1 tablet by mouth daily. singh 11/26/19 07/14/21  [provider]    Allergies as of 09/02/2021 -  Review Complete 08/26/2021  Allergen Reaction Noted   Betadine [povidone iodine]  07/21/2013    Family History  Problem Relation Age of Onset   Lung cancer Mother    Heart attack Father    Heart attack Brother    COPD Brother    Diabetes Brother    Esophageal cancer Nephew     Social History   Socioeconomic History   Marital status: Married    Spouse name: [name]   Number of children: 2   Years of education: GED   Highest education level: Not on file  Occupational History    Comment: Disabled  Tobacco Use   Smoking status: Former    Packs/day: 1.50    Years: 30.00    Pack years: 45.00    Types: Cigarettes     Quit date: 01/23/2006    Years since quitting: 15.6   Smokeless tobacco: Never   Tobacco comments:    N/A  Vaping Use   Vaping Use: Never used  Substance and Sexual Activity   Alcohol use: Yes    Alcohol/week: 0.0 standard drinks    Comment: rare; maybe 2 beers a year   Drug use: No   Sexual activity: Not Currently  Other Topics Concern   Not on file  Social History Narrative   Patient is disabled.    Patient lives with his wife Mikolaj Woolstenhulme.    Patient has 2 children.       Social Determinants of Health   Financial Resource Strain: Low Risk    Difficulty of Paying Living Expenses: Not hard at all  Food Insecurity: No Food Insecurity   Worried About Charity fundraiser in the Last Year: Never true   [place] in the Last Year: Never true  Transportation Needs: No Transportation Needs   Lack of Transportation (Medical): No   Lack of Transportation (Non-Medical): No  Physical Activity: Inactive   Days of Exercise per Week: 0 days   Minutes of Exercise per Session: 0 min  Stress: No Stress Concern Present   Feeling of Stress : Not at all  Social Connections: Moderately Isolated   Frequency of Communication with Friends and Family: More than three times a week   Frequency of Social Gatherings with Friends and Family: Once a week   Attends Religious Services: Never   Marine scientist or Organizations: No   Attends Music therapist: Never   Marital Status: Married  Human resources officer Violence: Not At Risk   Fear of Current or Ex-Partner: No   Emotionally Abused: No   Physically Abused: No   Sexually Abused: No    Review of Systems: See HPI, otherwise negative ROS  Physical Exam: BP 137/62   Pulse 79   Temp (!) 96.2 F (35.7 C) (Temporal)   Resp 20   Ht 5' 10" (1.778 m)   Wt 96.2 kg   SpO2 96%   BMI 30.42 kg/m  General:   Alert,  pleasant and cooperative in NAD Head:  Normocephalic and atraumatic. Neck:  Supple; no masses or  thyromegaly. Lungs:  Clear throughout to auscultation.    Heart:  Regular rate and rhythm. Abdomen:  Soft, nontender and nondistended. Normal bowel sounds, without guarding, and without rebound.   Neurologic:  Alert and  oriented x4;  grossly normal neurologically.  Impression/Plan: Jeremy [name] is here for an endoscopy and colonoscopy to be performed for IDA  Risks, benefits, limitations, and alternatives regarding  endoscopy and  colonoscopy have been reviewed with the patient.  Questions have been answered.  All parties agreeable.   Jeremy Lame, MD  09/10/2021, 7:31 AM

## 2021-09-10 NOTE — Telephone Encounter (Signed)
Wife called stating that pt is weak and RN says that he needs lab work done and possible infusion. Call back at [phone]

## 2021-09-10 NOTE — Op Note (Addendum)
[facility] Patient Name: Jeremy [name] Procedure Date: 09/10/2021 7:38 AM MRN: [id number] Account #: [id number] Date of Birth: January 05, 1951 Admit Type: Outpatient Age: 70 Room: [facility] ENDO ROOM 4 Gender: Male Note Status: Finalized Instrument Name: Upper Endoscope [id number] Procedure:             Upper GI endoscopy Indications:           Iron deficiency anemia Providers:             [name] MD, MD Referring MD:          Juline Patch, MD (Referring MD) Medicines:             Propofol per Anesthesia Complications:         No immediate complications. Procedure:             Pre-Anesthesia Assessment:                        - Prior to the procedure, a History and Physical was                         performed, and patient medications and allergies were                         reviewed. The patient's tolerance of previous                         anesthesia was also reviewed. The risks and benefits                         of the procedure and the sedation options and risks                         were discussed with the patient. All questions were                         answered, and informed consent was obtained. Prior                         Anticoagulants: The patient has taken no previous                         anticoagulant or antiplatelet agents. ASA Grade                         Assessment: III - A patient with severe systemic                         disease. After reviewing the risks and benefits, the                         patient was deemed in satisfactory condition to                         undergo the procedure.                        After obtaining informed consent, the endoscope was  passed under direct vision. Throughout the procedure,                         the patient's blood pressure, pulse, and oxygen                         saturations were monitored continuously. The Endoscope                          was introduced through the mouth, and advanced to the                         second part of duodenum. The upper GI endoscopy was                         accomplished without difficulty. The patient tolerated                         the procedure well. Findings:      The Z-line was irregular.      A small hiatal hernia was present.      Localized moderate inflammation characterized by erythema was found in       the gastric antrum. Biopsies were taken with a cold forceps for       histology.      A single 10 mm sessile polyp with no bleeding and no stigmata of recent       bleeding was found in the gastric antrum. Biopsies were taken with a       cold forceps for histology.      The examined duodenum was normal. Impression:            - Z-line irregular.                        - Small hiatal hernia.                        - Gastritis. Biopsied.                        - A single gastric polyp. Biopsied.                        - Normal examined duodenum. Recommendation:        - Discharge patient to home.                        - Resume previous diet.                        - Continue present medications.                        - Await pathology results.                        - If polyp adenomatous then will need removal. Procedure Code(s):     --- Professional ---                        [phone], Esophagogastroduodenoscopy, flexible,  transoral; with biopsy, single or multiple Diagnosis Code(s):     --- Professional ---                        D50.9, Iron deficiency anemia, unspecified                        K29.70, Gastritis, unspecified, without bleeding                        K31.7, Polyp of stomach and duodenum CPT copyright 2019 [employer]. All rights reserved. The codes documented in this report are preliminary and upon coder review may  be revised to meet current compliance requirements. [name] MD, MD 09/10/2021 7:51:53 AM This  report has been signed electronically. Number of Addenda: 0 Note Initiated On: 09/10/2021 7:38 AM Estimated Blood Loss:  Estimated blood loss: none.      [facility]

## 2021-09-11 ENCOUNTER — Ambulatory Visit: Payer: Medicare Other

## 2021-09-11 ENCOUNTER — Encounter: Payer: Self-pay | Admitting: Gastroenterology

## 2021-09-11 ENCOUNTER — Ambulatory Visit: Payer: Medicare Other | Admitting: Nurse Practitioner

## 2021-09-11 ENCOUNTER — Other Ambulatory Visit: Payer: Self-pay

## 2021-09-11 ENCOUNTER — Inpatient Hospital Stay: Payer: Medicare Other | Attending: Nurse Practitioner

## 2021-09-11 DIAGNOSIS — D7589 Other specified diseases of blood and blood-forming organs: Secondary | ICD-10-CM | POA: Diagnosis not present

## 2021-09-11 DIAGNOSIS — D500 Iron deficiency anemia secondary to blood loss (chronic): Secondary | ICD-10-CM

## 2021-09-11 DIAGNOSIS — D649 Anemia, unspecified: Secondary | ICD-10-CM | POA: Insufficient documentation

## 2021-09-11 DIAGNOSIS — Z87891 Personal history of nicotine dependence: Secondary | ICD-10-CM | POA: Insufficient documentation

## 2021-09-11 LAB — CBC WITH DIFFERENTIAL/PLATELET
Abs Immature Granulocytes: 0.01 10*3/uL (ref 0.00–0.07)
Basophils Absolute: 0 10*3/uL (ref 0.0–0.1)
Basophils Relative: 1 %
Eosinophils Absolute: 0.2 10*3/uL (ref 0.0–0.5)
Eosinophils Relative: 6 %
HCT: 23.7 % — ABNORMAL LOW (ref 39.0–52.0)
Hemoglobin: 7 g/dL — ABNORMAL LOW (ref 13.0–17.0)
Immature Granulocytes: 0 %
Lymphocytes Relative: 27 %
Lymphs Abs: 0.9 10*3/uL (ref 0.7–4.0)
MCH: 27.8 pg (ref 26.0–34.0)
MCHC: 29.5 g/dL — ABNORMAL LOW (ref 30.0–36.0)
MCV: 94 fL (ref 80.0–100.0)
Monocytes Absolute: 0.5 10*3/uL (ref 0.1–1.0)
Monocytes Relative: 15 %
Neutro Abs: 1.7 10*3/uL (ref 1.7–7.7)
Neutrophils Relative %: 51 %
Platelets: 131 10*3/uL — ABNORMAL LOW (ref 150–400)
RBC: 2.52 MIL/uL — ABNORMAL LOW (ref 4.22–5.81)
RDW: 15.1 % (ref 11.5–15.5)
WBC: 3.3 10*3/uL — ABNORMAL LOW (ref 4.0–10.5)
nRBC: 0 % (ref 0.0–0.2)

## 2021-09-11 LAB — IRON AND TIBC
Iron: 25 ug/dL — ABNORMAL LOW (ref 45–182)
Saturation Ratios: 5 % — ABNORMAL LOW (ref 17.9–39.5)
TIBC: 517 ug/dL — ABNORMAL HIGH (ref 250–450)
UIBC: 492 ug/dL

## 2021-09-11 LAB — FERRITIN: Ferritin: 27 ng/mL (ref 24–336)

## 2021-09-11 LAB — SURGICAL PATHOLOGY

## 2021-09-11 NOTE — Telephone Encounter (Signed)
Spoke to pt and he states that he is still feeling weak. Per Dr. Tasia Catchings, ok to move appts up and see NP next week. Pt agrees to come in for lab today around 3:30.  Please add pt to labs today and to see NP/ venofer on Monday or Tues. Please inform pt of appts. Please cancel appts at the end of NOV.

## 2021-09-12 LAB — SAMPLE TO BLOOD BANK

## 2021-09-14 ENCOUNTER — Other Ambulatory Visit: Payer: Self-pay | Admitting: Specialist

## 2021-09-14 ENCOUNTER — Telehealth: Payer: Self-pay | Admitting: *Deleted

## 2021-09-14 ENCOUNTER — Other Ambulatory Visit: Payer: Self-pay

## 2021-09-14 DIAGNOSIS — G35 Multiple sclerosis: Secondary | ICD-10-CM | POA: Diagnosis not present

## 2021-09-14 DIAGNOSIS — D500 Iron deficiency anemia secondary to blood loss (chronic): Secondary | ICD-10-CM

## 2021-09-14 DIAGNOSIS — J449 Chronic obstructive pulmonary disease, unspecified: Secondary | ICD-10-CM | POA: Diagnosis not present

## 2021-09-14 DIAGNOSIS — R0609 Other forms of dyspnea: Secondary | ICD-10-CM

## 2021-09-14 DIAGNOSIS — R0600 Dyspnea, unspecified: Secondary | ICD-10-CM | POA: Diagnosis not present

## 2021-09-14 DIAGNOSIS — R911 Solitary pulmonary nodule: Secondary | ICD-10-CM

## 2021-09-14 NOTE — Telephone Encounter (Signed)
Wife called to ask if patient will be seen today for his HGB of 7. He has an appointment tomorrow for NP and possible infusion. She states he is very pale and weak today.

## 2021-09-15 ENCOUNTER — Encounter: Payer: Self-pay | Admitting: Gastroenterology

## 2021-09-15 ENCOUNTER — Inpatient Hospital Stay: Payer: Medicare Other

## 2021-09-15 ENCOUNTER — Encounter: Payer: Self-pay | Admitting: Oncology

## 2021-09-15 ENCOUNTER — Other Ambulatory Visit: Payer: Self-pay

## 2021-09-15 ENCOUNTER — Inpatient Hospital Stay (HOSPITAL_BASED_OUTPATIENT_CLINIC_OR_DEPARTMENT_OTHER): Payer: Medicare Other | Admitting: Oncology

## 2021-09-15 VITALS — BP 132/54 | HR 71 | Temp 97.0°F | Resp 18 | Wt 209.7 lb

## 2021-09-15 DIAGNOSIS — D500 Iron deficiency anemia secondary to blood loss (chronic): Secondary | ICD-10-CM

## 2021-09-15 DIAGNOSIS — Z87891 Personal history of nicotine dependence: Secondary | ICD-10-CM | POA: Diagnosis not present

## 2021-09-15 DIAGNOSIS — D649 Anemia, unspecified: Secondary | ICD-10-CM | POA: Diagnosis not present

## 2021-09-15 DIAGNOSIS — D7589 Other specified diseases of blood and blood-forming organs: Secondary | ICD-10-CM | POA: Diagnosis not present

## 2021-09-15 LAB — CBC WITH DIFFERENTIAL/PLATELET
Abs Immature Granulocytes: 0.02 10*3/uL (ref 0.00–0.07)
Basophils Absolute: 0 10*3/uL (ref 0.0–0.1)
Basophils Relative: 1 %
Eosinophils Absolute: 0.2 10*3/uL (ref 0.0–0.5)
Eosinophils Relative: 5 %
HCT: 21.2 % — ABNORMAL LOW (ref 39.0–52.0)
Hemoglobin: 6.4 g/dL — ABNORMAL LOW (ref 13.0–17.0)
Immature Granulocytes: 1 %
Lymphocytes Relative: 15 %
Lymphs Abs: 0.6 10*3/uL — ABNORMAL LOW (ref 0.7–4.0)
MCH: 27.8 pg (ref 26.0–34.0)
MCHC: 30.2 g/dL (ref 30.0–36.0)
MCV: 92.2 fL (ref 80.0–100.0)
Monocytes Absolute: 0.6 10*3/uL (ref 0.1–1.0)
Monocytes Relative: 14 %
Neutro Abs: 2.7 10*3/uL (ref 1.7–7.7)
Neutrophils Relative %: 64 %
Platelets: 135 10*3/uL — ABNORMAL LOW (ref 150–400)
RBC: 2.3 MIL/uL — ABNORMAL LOW (ref 4.22–5.81)
RDW: 15.7 % — ABNORMAL HIGH (ref 11.5–15.5)
WBC: 4.2 10*3/uL (ref 4.0–10.5)
nRBC: 0 % (ref 0.0–0.2)

## 2021-09-15 LAB — SAMPLE TO BLOOD BANK

## 2021-09-15 LAB — ABO/RH: ABO/RH(D): A POS

## 2021-09-15 LAB — PREPARE RBC (CROSSMATCH)

## 2021-09-15 MED ORDER — DIPHENHYDRAMINE HCL 25 MG PO CAPS
25.0000 mg | ORAL_CAPSULE | Freq: Once | ORAL | Status: AC
  Administered 2021-09-15: 25 mg via ORAL
  Filled 2021-09-15: qty 1

## 2021-09-15 MED ORDER — ACETAMINOPHEN 325 MG PO TABS
650.0000 mg | ORAL_TABLET | Freq: Once | ORAL | Status: AC
  Administered 2021-09-15: 650 mg via ORAL
  Filled 2021-09-15: qty 2

## 2021-09-15 MED ORDER — SODIUM CHLORIDE 0.9% IV SOLUTION
250.0000 mL | Freq: Once | INTRAVENOUS | Status: AC
  Administered 2021-09-15: 250 mL via INTRAVENOUS
  Filled 2021-09-15: qty 250

## 2021-09-15 NOTE — Progress Notes (Signed)
Symptom Management Consult note [facility]  [phone] [phone]  Patient Care Team: Juline Patch, MD as PCP - General (Family Medicine) Marcial Pacas, MD as Consulting Physician (Neurology) Samara Deist, DPM as Consulting Physician (Podiatry) Lonia Farber, MD as Consulting Physician (Internal Medicine) Magnus Sinning, MD as Consulting Physician (Nephrology)   Name of the patient: Jeremy [name]  [id number]  1951/05/11   Date of visit: 09/15/2021  Diagnosis: 1. Iron deficiency anemia due to chronic blood loss - sodium chloride flush (NS) 0.9 % injection 10 mL - heparin lock flush 100 unit/mL - heparin lock flush 100 unit/mL - sodium chloride flush (NS) 0.9 % injection 3 mL    Chief Complaint: Follow-up after chemo   Oncology History:  Jeremy [name] is a  70 y.o.  male with PMH listed below for follow-up for anemia.   Patient was accompanied by wife. He reports feeling weak and fatigued. Patient has chronic anemia, baseline was 10-11. 06/01/2021, he had a blood work done at nephrologist office.  Hemoglobin 9.2, Patient has chronic kidney disease.  History of GI bleeding after colonoscopy Chronic respiratory failure on nasal cannula oxygen 2L due to COVID 19 infection  INTERVAL HISTORY Jeremy [name] is a 70 y.o. male who has above history who presents to clinic today for follow-up for a low hemoglbin. States he had labs drawn on Friday and his hemoglobin was 7.0. He has had several Iv iron infusions in the past and most recently received 7 doses from 06/24/21-08/06/21. Wife reports significant fatigue and weakness over the past few weeks. Denies any bleeding. Had a recent colonoscopy/edg  on 09/10/21 which showed non-bleeding internal hemorrhoids. Plan is for capsule study in the future.   Review of Systems  Constitutional:  Positive for fatigue.  Respiratory:  Positive for shortness of breath.   Neurological:  Positive  for dizziness.   Past Medical History:  Diagnosis Date   Chronic pain    Depression    Diabetes ([place])    GERD (gastroesophageal reflux disease)    Hyperlipemia    Hypertension    MS (multiple sclerosis) ([place])    Past Surgical History:  Procedure Laterality Date   COLECTOMY  06-2008   COLONOSCOPY  2015   normal   COLONOSCOPY WITH PROPOFOL N/A 09/10/2021   Procedure: COLONOSCOPY WITH PROPOFOL;  Surgeon: [name], MD;  Location: [facility] ENDOSCOPY;  Service: Endoscopy;  Laterality: N/A;   ESOPHAGOGASTRODUODENOSCOPY (EGD) WITH PROPOFOL N/A 09/10/2021   Procedure: ESOPHAGOGASTRODUODENOSCOPY (EGD) WITH PROPOFOL;  Surgeon: [name], MD;  Location: [facility] ENDOSCOPY;  Service: Endoscopy;  Laterality: N/A;    Family History  Problem Relation Age of Onset   Lung cancer Mother    Heart attack Father    Heart attack Brother    COPD Brother    Diabetes Brother    Esophageal cancer Nephew     Social History   Socioeconomic History   Marital status: Married    Spouse name: [name]   Number of children: 2   Years of education: GED   Highest education level: Not on file  Occupational History    Comment: Disabled  Tobacco Use   Smoking status: Former    Packs/day: 1.50    Years: 30.00    Pack years: 45.00    Types: Cigarettes    Quit date: 01/23/2006    Years since quitting: 15.6   Smokeless tobacco: Never   Tobacco comments:    N/A  Vaping Use  Vaping Use: Never used  Substance and Sexual Activity   Alcohol use: Yes    Alcohol/week: 0.0 standard drinks    Comment: rare; maybe 2 beers a year   Drug use: No   Sexual activity: Not Currently  Other Topics Concern   Not on file  Social History Narrative   Patient is disabled.    Patient lives with his wife Elijahjames Fuelling.    Patient has 2 children.       Social Determinants of Health   Financial Resource Strain: Low Risk    Difficulty of Paying Living Expenses: Not hard at all  Food Insecurity: No Food Insecurity   Worried  About Charity fundraiser in the Last Year: Never true   [place] in the Last Year: Never true  Transportation Needs: No Transportation Needs   Lack of Transportation (Medical): No   Lack of Transportation (Non-Medical): No  Physical Activity: Inactive   Days of Exercise per Week: 0 days   Minutes of Exercise per Session: 0 min  Stress: No Stress Concern Present   Feeling of Stress : Not at all  Social Connections: Moderately Isolated   Frequency of Communication with Friends and Family: More than three times a week   Frequency of Social Gatherings with Friends and Family: Once a week   Attends Religious Services: Never   Marine scientist or Organizations: No   Attends Music therapist: Never   Marital Status: Married  Human resources officer Violence: Not At Risk   Fear of Current or Ex-Partner: No   Emotionally Abused: No   Physically Abused: No   Sexually Abused: No    Current Outpatient Medications on File Prior to Visit  Medication Sig Dispense Refill   albuterol (VENTOLIN) (5 MG/ML) 0.5% NEBU Take by nebulization continuous.     aspirin 81 MG tablet Take 81 mg by mouth daily.     B-D INSULIN SYRINGE 1CC/25GX1" 25G X 1" 1 ML MISC USE AS DIRECTED 10 each 2   BD PEN NEEDLE NANO U/F 32G X 4 MM MISC      COMBIVENT RESPIMAT 20-100 MCG/ACT AERS respimat Inhale 1 puff into the lungs 4 (four) times daily.     cyanocobalamin (,VITAMIN B-12,) 1000 MCG/ML injection Administer every 14 days 6 mL 0   ferrous sulfate (FEROSUL) 325 (65 FE) MG tablet TAKE 1 TABLET(325 MG) BY MOUTH THREE TIMES DAILY (Patient not taking: No sig reported) 270 tablet 1   furosemide (LASIX) 40 MG tablet Take 1 tablet (40 mg total) by mouth every other day. (Patient taking differently: Take 20 mg by mouth every other day.) 45 tablet 1   gemfibrozil (LOPID) 600 MG tablet TAKE 1 TABLET(600 MG) BY MOUTH TWICE DAILY BEFORE A MEAL 180 tablet 1   Insulin Glargine (LANTUS) 100 UNIT/ML Solostar Pen Inject  32 Units into the skin daily. Dr Honor Junes     Interferon Beta-1b (BETASERON) 0.3 MG KIT injection Inject subcutaneously 1 syringe every other day. 45 kit 4   loratadine (CLARITIN) 10 MG tablet Take 1 tablet (10 mg total) by mouth daily. 90 tablet 1   losartan (COZAAR) 25 MG tablet Take 1 tablet by mouth daily. singh     metFORMIN (GLUCOPHAGE) 500 MG tablet Take 1 tablet by mouth 2 (two) times daily.     omeprazole (PRILOSEC) 40 MG capsule TAKE 1 CAPSULE BY MOUTH EVERY DAY 90 capsule 1   ONETOUCH ULTRA test strip TEST TID  pregabalin (LYRICA) 200 MG capsule Take 1 capsule (200 mg total) by mouth 2 (two) times daily. 60 capsule 5   sertraline (ZOLOFT) 50 MG tablet Take 1/2 tablet daily 45 tablet 1   simvastatin (ZOCOR) 40 MG tablet Take 1 tablet (40 mg total) by mouth daily. 90 tablet 1   sodium bicarbonate 650 MG tablet Take 650 mg by mouth 2 (two) times daily.     traMADol (ULTRAM) 50 MG tablet Take 1 tablet (50 mg total) by mouth every 6 (six) hours as needed. 120 tablet 5   No current facility-administered medications on file prior to visit.    Allergies  Allergen Reactions   Betadine [Povidone Iodine]        Observations/Objective: There were no vitals filed for this visit. There is no height or weight on file to calculate BMI.  Physical Exam Constitutional:      Appearance: Normal appearance.  HENT:     Head: Normocephalic and atraumatic.  Eyes:     Pupils: Pupils are equal, round, and reactive to light.  Cardiovascular:     Rate and Rhythm: Normal rate and regular rhythm.     Heart sounds: Normal heart sounds. No murmur heard. Pulmonary:     Effort: Pulmonary effort is normal.     Breath sounds: Normal breath sounds. No wheezing.  Abdominal:     General: Bowel sounds are normal. There is no distension.     Palpations: Abdomen is soft.     Tenderness: There is no abdominal tenderness.  Musculoskeletal:        General: Normal range of motion.     Cervical back:  Normal range of motion.  Skin:    General: Skin is warm and dry.     Coloration: Skin is pale.     Findings: No rash.  Neurological:     Mental Status: He is alert and oriented to person, place, and time.     Gait: Gait is intact.  Psychiatric:        Mood and Affect: Mood and affect normal.        Cognition and Memory: Memory normal.        Judgment: Judgment normal.    CBC    Component Value Date/Time   WBC 4.2 09/15/2021 1024   RBC 2.30 (L) 09/15/2021 1024   HGB 6.4 (L) 09/15/2021 1024   HGB 9.8 (L) 12/25/2020 1058   HCT 21.2 (L) 09/15/2021 1024   HCT 29.5 (L) 12/25/2020 1058   PLT 135 (L) 09/15/2021 1024   PLT 173 12/25/2020 1058   MCV 92.2 09/15/2021 1024   MCV 97 12/25/2020 1058   MCV 94 10/10/2013 1130   MCH 27.8 09/15/2021 1024   MCHC 30.2 09/15/2021 1024   RDW 15.7 (H) 09/15/2021 1024   RDW 13.5 12/25/2020 1058   RDW 16.4 (H) 10/10/2013 1130   LYMPHSABS 0.6 (L) 09/15/2021 1024   LYMPHSABS 0.8 12/25/2020 1058   LYMPHSABS 1.0 10/10/2013 1130   MONOABS 0.6 09/15/2021 1024   MONOABS 0.4 10/10/2013 1130   EOSABS 0.2 09/15/2021 1024   EOSABS 0.2 12/25/2020 1058   EOSABS 0.1 10/10/2013 1130   BASOSABS 0.0 09/15/2021 1024   BASOSABS 0.1 12/25/2020 1058   BASOSABS 0.1 10/10/2013 1130    CMP     Component Value Date/Time   NA 136 (A) 06/01/2021 0000   NA 143 08/31/2013 0417   K 5.0 06/01/2021 0000   K 3.5 08/31/2013 0417   CL 106 06/01/2021 0000  CL 109 (H) 08/31/2013 0417   CO2 17 06/01/2021 0000   CO2 28 08/31/2013 0417   GLUCOSE 108 (H) 12/25/2020 1058   GLUCOSE 133 (H) 08/31/2013 0417   BUN 39 (A) 06/01/2021 0000   BUN 39 (H) 08/31/2013 0417   CREATININE 1.3 06/01/2021 0000   CREATININE 1.31 (H) 12/25/2020 1058   CREATININE 1.53 (H) 08/31/2013 0417   CALCIUM 9.3 06/01/2021 0000   CALCIUM 8.9 08/31/2013 0417   PROT 7.3 07/23/2021 1342   PROT 6.9 12/25/2020 1058   PROT 6.2 (L) 08/31/2013 0417   ALBUMIN 3.4 (L) 07/23/2021 1342   ALBUMIN 4.1  12/25/2020 1058   ALBUMIN 2.9 (L) 08/31/2013 0417   AST 22 07/23/2021 1342   AST 35 08/31/2013 0417   ALT 14 07/23/2021 1342   ALT 15 08/31/2013 0417   ALKPHOS 58 07/23/2021 1342   ALKPHOS 60 08/31/2013 0417   BILITOT 0.7 07/23/2021 1342   BILITOT <0.2 12/25/2020 1058   BILITOT 0.6 08/31/2013 0417   GFRNONAA 53 (L) 01/15/2020 0954   GFRNONAA 48 (L) 08/31/2013 0417   GFRAA 61 01/15/2020 0954   GFRAA 56 (L) 08/31/2013 0417     Assessment and Plan: No diagnosis found. Anemia- [facility] GI bleeding although recent colonoscopy on 09/10/21 showed nonbleeding internal hemorrhoids and and in colocolonic anastomosis in the transverse colon. He is to be scheduled for capsule study in near future. No evidence of bleeding per patient. Labs are reviewed and discussed with patient.  He recently received 2 doses of IV Venofer on 07/30/2021 and 08/06/2021.  Labs from today show hemoglobin of 6.4, platelets 135.  Proceed with 1 unit PRBC today.  We will touch base with Dr. Allen Norris regarding recent decline given drop in hemoglobin.RTC in 1 week for labs and poss blood/iron infusion.   Disposition- Blood today.  RTC in 1 week for labs and blood/iron.  F/u with GI regarding capsule study.  RTC in 1 month for f/u with labs, see Dr. Tasia Catchings and possible blood/iron.   I spent 25 minutes dedicated to the care of this patient (face-to-face and non-face-to-face) on the date of the encounter to include what is described in the assessment and plan.  Faythe Casa, NP 09/15/2021 3:57 PM

## 2021-09-15 NOTE — Patient Instructions (Signed)
Blood Transfusion, Adult, Care After This sheet gives you information about how to care for yourself after your procedure. Your doctor may also give you more specific instructions. If you have problems or questions, contact your doctor. What can I expect after the procedure? After the procedure, it is common to have: Bruising and soreness at the IV site. A headache. Follow these instructions at home: Insertion site care   Follow instructions from your doctor about how to take care of your insertion site. This is where an IV tube was put into your vein. Make sure you: Wash your hands with soap and water before and after you change your bandage (dressing). If you cannot use soap and water, use hand sanitizer. Change your bandage as told by your doctor. Check your insertion site every day for signs of infection. Check for: Redness, swelling, or pain. Bleeding from the site. Warmth. Pus or a bad smell. General instructions Take over-the-counter and prescription medicines only as told by your doctor. Rest as told by your doctor. Go back to your normal activities as told by your doctor. Keep all follow-up visits as told by your doctor. This is important. Contact a doctor if: You have itching or red, swollen areas of skin (hives). You feel worried or nervous (anxious). You feel weak after doing your normal activities. You have redness, swelling, warmth, or pain around the insertion site. You have blood coming from the insertion site, and the blood does not stop with pressure. You have pus or a bad smell coming from the insertion site. Get help right away if: You have signs of a serious reaction. This may be coming from an allergy or the body's defense system (immune system). Signs include: Trouble breathing or shortness of breath. Swelling of the face or feeling warm (flushed). Fever or chills. Head, chest, or back pain. Dark pee (urine) or blood in the pee. Widespread rash. Fast  heartbeat. Feeling dizzy or light-headed. You may receive your blood transfusion in an outpatient setting. If so, you will be told whom to contact to report any reactions. These symptoms may be an emergency. Do not wait to see if the symptoms will go away. Get medical help right away. Call your local emergency services (911 in the [place] Do not drive yourself to the hospital. Summary Bruising and soreness at the IV site are common. Check your insertion site every day for signs of infection. Rest as told by your doctor. Go back to your normal activities as told by your doctor. Get help right away if you have signs of a serious reaction. This information is not intended to replace advice given to you by your health care provider. Make sure you discuss any questions you have with your health care provider. Document Revised: 02/05/2021 Document Reviewed: 04/05/2019 Elsevier Patient Education  2022 [employer] Inc[employer]  

## 2021-09-16 LAB — BPAM RBC
Blood Product Expiration Date: 202211262359
Blood Product Expiration Date: 202212162359
ISSUE DATE / TIME: 202211221247
Unit Type and Rh: 600
Unit Type and Rh: 6200

## 2021-09-16 LAB — TYPE AND SCREEN
ABO/RH(D): A POS
Antibody Screen: NEGATIVE
Unit division: 0
Unit division: 0

## 2021-09-22 ENCOUNTER — Other Ambulatory Visit: Payer: Self-pay | Admitting: *Deleted

## 2021-09-22 ENCOUNTER — Other Ambulatory Visit: Payer: Self-pay

## 2021-09-22 ENCOUNTER — Telehealth: Payer: Self-pay | Admitting: Gastroenterology

## 2021-09-22 ENCOUNTER — Other Ambulatory Visit: Payer: Medicare Other

## 2021-09-22 DIAGNOSIS — D500 Iron deficiency anemia secondary to blood loss (chronic): Secondary | ICD-10-CM

## 2021-09-22 NOTE — Telephone Encounter (Signed)
Returned pt's call and spoke with wife. Pt has been scheduled for a [facility] capsule study at [facility] on 10/01/21. Instructions have been sent to pt's mychart.

## 2021-09-22 NOTE — Telephone Encounter (Signed)
Inbound call from pt's wife requesting a call back trying to schedule an appt to swallow capsule. Pt's wife stated that she has called and hasn't heard anything back regarding this test. Please advise. Thank you.

## 2021-09-23 ENCOUNTER — Telehealth: Payer: Medicare Other | Admitting: Oncology

## 2021-09-24 ENCOUNTER — Ambulatory Visit: Payer: Medicare Other

## 2021-09-24 ENCOUNTER — Other Ambulatory Visit: Payer: Self-pay

## 2021-09-24 ENCOUNTER — Inpatient Hospital Stay: Payer: Medicare Other

## 2021-09-24 ENCOUNTER — Inpatient Hospital Stay
Admission: EM | Admit: 2021-09-24 | Discharge: 2021-09-27 | DRG: 378 | Disposition: A | Discharge status: Home / Self Care | Payer: Medicare Other | Attending: Internal Medicine | Admitting: Internal Medicine

## 2021-09-24 ENCOUNTER — Encounter: Payer: Self-pay | Admitting: Emergency Medicine

## 2021-09-24 ENCOUNTER — Emergency Department: Payer: Medicare Other

## 2021-09-24 ENCOUNTER — Inpatient Hospital Stay: Payer: Medicare Other | Attending: Oncology

## 2021-09-24 DIAGNOSIS — I10 Essential (primary) hypertension: Secondary | ICD-10-CM | POA: Diagnosis present

## 2021-09-24 DIAGNOSIS — D631 Anemia in chronic kidney disease: Secondary | ICD-10-CM | POA: Diagnosis not present

## 2021-09-24 DIAGNOSIS — J449 Chronic obstructive pulmonary disease, unspecified: Secondary | ICD-10-CM | POA: Diagnosis present

## 2021-09-24 DIAGNOSIS — D500 Iron deficiency anemia secondary to blood loss (chronic): Secondary | ICD-10-CM | POA: Insufficient documentation

## 2021-09-24 DIAGNOSIS — K922 Gastrointestinal hemorrhage, unspecified: Secondary | ICD-10-CM

## 2021-09-24 DIAGNOSIS — Z20822 Contact with and (suspected) exposure to covid-19: Secondary | ICD-10-CM | POA: Diagnosis present

## 2021-09-24 DIAGNOSIS — K219 Gastro-esophageal reflux disease without esophagitis: Secondary | ICD-10-CM | POA: Diagnosis present

## 2021-09-24 DIAGNOSIS — K2971 Gastritis, unspecified, with bleeding: Secondary | ICD-10-CM | POA: Diagnosis present

## 2021-09-24 DIAGNOSIS — Z79899 Other long term (current) drug therapy: Secondary | ICD-10-CM | POA: Insufficient documentation

## 2021-09-24 DIAGNOSIS — K2091 Esophagitis, unspecified with bleeding: Secondary | ICD-10-CM | POA: Diagnosis present

## 2021-09-24 DIAGNOSIS — Z87891 Personal history of nicotine dependence: Secondary | ICD-10-CM

## 2021-09-24 DIAGNOSIS — D649 Anemia, unspecified: Secondary | ICD-10-CM

## 2021-09-24 DIAGNOSIS — M25551 Pain in right hip: Secondary | ICD-10-CM | POA: Diagnosis not present

## 2021-09-24 DIAGNOSIS — D72819 Decreased white blood cell count, unspecified: Secondary | ICD-10-CM | POA: Diagnosis present

## 2021-09-24 DIAGNOSIS — N1831 Chronic kidney disease, stage 3a: Secondary | ICD-10-CM | POA: Insufficient documentation

## 2021-09-24 DIAGNOSIS — Z7984 Long term (current) use of oral hypoglycemic drugs: Secondary | ICD-10-CM | POA: Diagnosis not present

## 2021-09-24 DIAGNOSIS — C9000 Multiple myeloma not having achieved remission: Secondary | ICD-10-CM | POA: Diagnosis present

## 2021-09-24 DIAGNOSIS — G35 Multiple sclerosis: Secondary | ICD-10-CM | POA: Diagnosis not present

## 2021-09-24 DIAGNOSIS — Z833 Family history of diabetes mellitus: Secondary | ICD-10-CM | POA: Diagnosis not present

## 2021-09-24 DIAGNOSIS — Z794 Long term (current) use of insulin: Secondary | ICD-10-CM | POA: Diagnosis not present

## 2021-09-24 DIAGNOSIS — N183 Chronic kidney disease, stage 3 unspecified: Secondary | ICD-10-CM | POA: Diagnosis present

## 2021-09-24 DIAGNOSIS — Z888 Allergy status to other drugs, medicaments and biological substances status: Secondary | ICD-10-CM | POA: Diagnosis not present

## 2021-09-24 DIAGNOSIS — Z825 Family history of asthma and other chronic lower respiratory diseases: Secondary | ICD-10-CM | POA: Diagnosis not present

## 2021-09-24 DIAGNOSIS — Z7982 Long term (current) use of aspirin: Secondary | ICD-10-CM | POA: Insufficient documentation

## 2021-09-24 DIAGNOSIS — D508 Other iron deficiency anemias: Secondary | ICD-10-CM

## 2021-09-24 DIAGNOSIS — E785 Hyperlipidemia, unspecified: Secondary | ICD-10-CM | POA: Diagnosis present

## 2021-09-24 DIAGNOSIS — F32A Depression, unspecified: Secondary | ICD-10-CM | POA: Diagnosis present

## 2021-09-24 DIAGNOSIS — Z8249 Family history of ischemic heart disease and other diseases of the circulatory system: Secondary | ICD-10-CM | POA: Diagnosis not present

## 2021-09-24 DIAGNOSIS — Z801 Family history of malignant neoplasm of trachea, bronchus and lung: Secondary | ICD-10-CM

## 2021-09-24 DIAGNOSIS — E1142 Type 2 diabetes mellitus with diabetic polyneuropathy: Secondary | ICD-10-CM | POA: Diagnosis not present

## 2021-09-24 DIAGNOSIS — K635 Polyp of colon: Secondary | ICD-10-CM | POA: Diagnosis present

## 2021-09-24 DIAGNOSIS — W19XXXA Unspecified fall, initial encounter: Secondary | ICD-10-CM

## 2021-09-24 DIAGNOSIS — J9611 Chronic respiratory failure with hypoxia: Secondary | ICD-10-CM | POA: Diagnosis present

## 2021-09-24 DIAGNOSIS — Z800 Family history of malignant neoplasm of digestive organs: Secondary | ICD-10-CM | POA: Diagnosis not present

## 2021-09-24 LAB — CBC WITH DIFFERENTIAL/PLATELET
Abs Immature Granulocytes: 0.01 10*3/uL (ref 0.00–0.07)
Basophils Absolute: 0 10*3/uL (ref 0.0–0.1)
Basophils Relative: 1 %
Eosinophils Absolute: 0.1 10*3/uL (ref 0.0–0.5)
Eosinophils Relative: 4 %
HCT: 18.1 % — ABNORMAL LOW (ref 39.0–52.0)
Hemoglobin: 5.3 g/dL — ABNORMAL LOW (ref 13.0–17.0)
Immature Granulocytes: 0 %
Lymphocytes Relative: 13 %
Lymphs Abs: 0.5 10*3/uL — ABNORMAL LOW (ref 0.7–4.0)
MCH: 27.2 pg (ref 26.0–34.0)
MCHC: 29.3 g/dL — ABNORMAL LOW (ref 30.0–36.0)
MCV: 92.8 fL (ref 80.0–100.0)
Monocytes Absolute: 0.4 10*3/uL (ref 0.1–1.0)
Monocytes Relative: 13 %
Neutro Abs: 2.3 10*3/uL (ref 1.7–7.7)
Neutrophils Relative %: 69 %
Platelets: 156 10*3/uL (ref 150–400)
RBC: 1.95 MIL/uL — ABNORMAL LOW (ref 4.22–5.81)
RDW: 16.3 % — ABNORMAL HIGH (ref 11.5–15.5)
WBC: 3.4 10*3/uL — ABNORMAL LOW (ref 4.0–10.5)
nRBC: 0 % (ref 0.0–0.2)

## 2021-09-24 LAB — PROTIME-INR
INR: 1.2 (ref 0.8–1.2)
Prothrombin Time: 15 seconds (ref 11.4–15.2)

## 2021-09-24 LAB — COMPREHENSIVE METABOLIC PANEL
ALT: 10 U/L (ref 0–44)
AST: 16 U/L (ref 15–41)
Albumin: 3.2 g/dL — ABNORMAL LOW (ref 3.5–5.0)
Alkaline Phosphatase: 52 U/L (ref 38–126)
Anion gap: 7 (ref 5–15)
BUN: 37 mg/dL — ABNORMAL HIGH (ref 8–23)
CO2: 18 mmol/L — ABNORMAL LOW (ref 22–32)
Calcium: 9.1 mg/dL (ref 8.9–10.3)
Chloride: 112 mmol/L — ABNORMAL HIGH (ref 98–111)
Creatinine, Ser: 1.24 mg/dL (ref 0.61–1.24)
GFR, Estimated: >60 mL/min (ref >60)
Glucose, Bld: 102 mg/dL — ABNORMAL HIGH (ref 70–99)
Potassium: 4.8 mmol/L (ref 3.5–5.1)
Sodium: 137 mmol/L (ref 135–145)
Total Bilirubin: 0.4 mg/dL (ref 0.3–1.2)
Total Protein: 6.6 g/dL (ref 6.5–8.1)

## 2021-09-24 LAB — URINALYSIS, COMPLETE (UACMP) WITH MICROSCOPIC
Bacteria, UA: NONE SEEN
Bilirubin Urine: NEGATIVE
Glucose, UA: NEGATIVE mg/dL
Hgb urine dipstick: NEGATIVE
Ketones, ur: NEGATIVE mg/dL
Leukocytes,Ua: NEGATIVE
Nitrite: NEGATIVE
Protein, ur: NEGATIVE mg/dL
Specific Gravity, Urine: 1.02 (ref 1.005–1.030)
Squamous Epithelial / HPF: NONE SEEN (ref 0–5)
pH: 6 (ref 5.0–8.0)

## 2021-09-24 LAB — SAMPLE TO BLOOD BANK

## 2021-09-24 LAB — RESP PANEL BY RT-PCR (FLU A&B, COVID) ARPGX2
Influenza A by PCR: NEGATIVE
Influenza B by PCR: NEGATIVE
SARS Coronavirus 2 by RT PCR: NEGATIVE

## 2021-09-24 LAB — FOLATE: Folate: 9.3 ng/mL (ref >5.9)

## 2021-09-24 LAB — PREPARE RBC (CROSSMATCH)

## 2021-09-24 LAB — IRON AND TIBC
Iron: 9 ug/dL — ABNORMAL LOW (ref 45–182)
Saturation Ratios: 2 % — ABNORMAL LOW (ref 17.9–39.5)
TIBC: 496 ug/dL — ABNORMAL HIGH (ref 250–450)
UIBC: 487 ug/dL

## 2021-09-24 LAB — CBG MONITORING, ED: Glucose-Capillary: 116 mg/dL — ABNORMAL HIGH (ref 70–99)

## 2021-09-24 LAB — FERRITIN: Ferritin: 13 ng/mL — ABNORMAL LOW (ref 24–336)

## 2021-09-24 MED ORDER — SODIUM CHLORIDE 0.9% FLUSH: 3.0000 mL | INTRAVENOUS | Status: DC | PRN

## 2021-09-24 MED ORDER — ONDANSETRON HCL 4 MG PO TABS: 4.0000 mg | ORAL_TABLET | Freq: Four times a day (QID) | ORAL | Status: DC | PRN

## 2021-09-24 MED ORDER — GEMFIBROZIL 600 MG PO TABS
600.0000 mg | ORAL_TABLET | Freq: Two times a day (BID) | ORAL | Status: DC
  Administered 2021-09-25 – 2021-09-27 (×4): 600 mg via ORAL
  Filled 2021-09-24 (×6): qty 1

## 2021-09-24 MED ORDER — INTERFERON BETA-1B 0.3 MG ~~LOC~~ KIT: 0.2500 mg | PACK | SUBCUTANEOUS | Status: DC

## 2021-09-24 MED ORDER — SODIUM CHLORIDE 0.9% FLUSH
3.0000 mL | Freq: Two times a day (BID) | INTRAVENOUS | Status: DC
  Administered 2021-09-24 – 2021-09-27 (×7): 3 mL via INTRAVENOUS

## 2021-09-24 MED ORDER — PREGABALIN 75 MG PO CAPS
200.0000 mg | ORAL_CAPSULE | Freq: Two times a day (BID) | ORAL | Status: DC
  Administered 2021-09-24 – 2021-09-27 (×6): 200 mg via ORAL
  Filled 2021-09-24 (×6): qty 1

## 2021-09-24 MED ORDER — LORATADINE 10 MG PO TABS
10.0000 mg | ORAL_TABLET | Freq: Every day | ORAL | Status: DC
  Administered 2021-09-25 – 2021-09-27 (×3): 10 mg via ORAL
  Filled 2021-09-24 (×4): qty 1

## 2021-09-24 MED ORDER — INSULIN GLARGINE 100 UNIT/ML SOLOSTAR PEN: 16.0000 [IU] | PEN_INJECTOR | Freq: Every day | SUBCUTANEOUS | Status: DC

## 2021-09-24 MED ORDER — FUROSEMIDE 20 MG PO TABS
20.0000 mg | ORAL_TABLET | ORAL | Status: DC
  Administered 2021-09-24 – 2021-09-26 (×2): 20 mg via ORAL
  Filled 2021-09-24 (×3): qty 1

## 2021-09-24 MED ORDER — SIMVASTATIN 40 MG PO TABS
40.0000 mg | ORAL_TABLET | Freq: Every evening | ORAL | Status: DC
  Administered 2021-09-24 – 2021-09-26 (×3): 40 mg via ORAL
  Filled 2021-09-24: qty 4
  Filled 2021-09-24 (×3): qty 1

## 2021-09-24 MED ORDER — TRAMADOL HCL 50 MG PO TABS
50.0000 mg | ORAL_TABLET | Freq: Four times a day (QID) | ORAL | Status: DC | PRN
  Administered 2021-09-24 – 2021-09-27 (×6): 50 mg via ORAL
  Filled 2021-09-24 (×6): qty 1

## 2021-09-24 MED ORDER — ONDANSETRON HCL 4 MG/2ML IJ SOLN: 4.0000 mg | Freq: Four times a day (QID) | INTRAMUSCULAR | Status: DC | PRN

## 2021-09-24 MED ORDER — SERTRALINE HCL 50 MG PO TABS
25.0000 mg | ORAL_TABLET | Freq: Every day | ORAL | Status: DC
  Administered 2021-09-25 – 2021-09-27 (×3): 25 mg via ORAL
  Filled 2021-09-24 (×3): qty 1

## 2021-09-24 MED ORDER — ACETAMINOPHEN 650 MG RE SUPP
650.0000 mg | Freq: Four times a day (QID) | RECTAL | Status: DC | PRN
  Filled 2021-09-24: qty 1

## 2021-09-24 MED ORDER — IPRATROPIUM-ALBUTEROL 0.5-2.5 (3) MG/3ML IN SOLN
3.0000 mL | Freq: Four times a day (QID) | RESPIRATORY_TRACT | Status: DC
  Administered 2021-09-25 – 2021-09-26 (×5): 3 mL via RESPIRATORY_TRACT
  Filled 2021-09-24 (×5): qty 3

## 2021-09-24 MED ORDER — LOSARTAN POTASSIUM 25 MG PO TABS
25.0000 mg | ORAL_TABLET | Freq: Every day | ORAL | Status: DC
  Administered 2021-09-25 – 2021-09-26 (×2): 25 mg via ORAL
  Filled 2021-09-24 (×3): qty 1

## 2021-09-24 MED ORDER — INSULIN ASPART 100 UNIT/ML IJ SOLN
0.0000 [IU] | Freq: Three times a day (TID) | INTRAMUSCULAR | Status: DC
  Administered 2021-09-25: 5 [IU] via SUBCUTANEOUS
  Administered 2021-09-26 (×2): 3 [IU] via SUBCUTANEOUS
  Administered 2021-09-27: 12:00:00 2 [IU] via SUBCUTANEOUS
  Filled 2021-09-24 (×4): qty 1

## 2021-09-24 MED ORDER — PANTOPRAZOLE SODIUM 40 MG IV SOLR
40.0000 mg | Freq: Once | INTRAVENOUS | Status: AC
  Administered 2021-09-24: 40 mg via INTRAVENOUS
  Filled 2021-09-24: qty 40

## 2021-09-24 MED ORDER — SODIUM CHLORIDE 0.9 % IV SOLN: 250.0000 mL | INTRAVENOUS | Status: DC | PRN

## 2021-09-24 MED ORDER — SODIUM CHLORIDE 0.9% IV SOLUTION
Freq: Once | INTRAVENOUS | Status: AC
  Filled 2021-09-24: qty 250

## 2021-09-24 MED ORDER — ACETAMINOPHEN 325 MG PO TABS
650.0000 mg | ORAL_TABLET | Freq: Four times a day (QID) | ORAL | Status: DC | PRN
  Administered 2021-09-25: 650 mg via ORAL
  Filled 2021-09-24: qty 2

## 2021-09-24 MED ORDER — INSULIN GLARGINE-YFGN 100 UNIT/ML ~~LOC~~ SOLN
16.0000 [IU] | Freq: Every day | SUBCUTANEOUS | Status: DC
  Administered 2021-09-25: 16 [IU] via SUBCUTANEOUS
  Filled 2021-09-24 (×4): qty 0.16

## 2021-09-24 MED ORDER — SODIUM BICARBONATE 650 MG PO TABS
650.0000 mg | ORAL_TABLET | Freq: Two times a day (BID) | ORAL | Status: DC
  Administered 2021-09-25 – 2021-09-27 (×5): 650 mg via ORAL
  Filled 2021-09-24 (×7): qty 1

## 2021-09-24 NOTE — ED Provider Notes (Addendum)
[facility] Emergency Department Provider Note  ____________________________________________   Event Date/Time   First MD Initiated Contact with Patient 09/24/21 1042     (approximate)  I have reviewed the triage vital signs and the nursing notes.   HISTORY  Chief Complaint Abnormal Lab   HPI Jeremy [name] is a 70 y.o. male with a past negative chronic pain, depression, DM, HTN, HDL and MS as well as chronic anemia currently followed by heme-onc on outpatient basis receiving outpatient iron infusions and recent work-up including colonoscopy and endoscopy on 11/17 that showed some gastritis who presents for assessment after having some blood work done today that showed hemoglobin of 5.3.  Patient states he is fatigued but this is not particular new today generalized fatigue.  Patient states he fell couple days ago after Thanksgiving after he received a blood transfusion sometime last week without any other falls.  He is not sure exactly how.  He denies hitting his head or any LOC.  States he only fell onto his lower back to right hip area or has some soreness.  Denies any other back pain, neck pain, headache, earache, sore throat, extremity pain or other acute falls or sick symptoms.  He thinks dark stools occasionally but is 100% sure.  He has not had any bright red stools or vomiting or bloody urine.  Denies any urinary symptoms, cough, shortness of breath, chest pain or other acute sick symptoms.  Is not on blood thinners and does not take NSAIDs.  No other acute concerns at this time.         Past Medical History:  Diagnosis Date   Chronic pain    Depression    Diabetes ([place])    GERD (gastroesophageal reflux disease)    Hyperlipemia    Hypertension    MS (multiple sclerosis) ([place])     Patient Active Problem List   Diagnosis Date Noted   Gastric polyp    Gastritis without bleeding    Anemia in stage 3a chronic kidney disease ([place]) 06/19/2021   Iron  deficiency anemia due to chronic blood loss 06/19/2021   Gait abnormality 05/07/2021   Chronic kidney disease, stage 3 unspecified ([place]) 05/20/2020   DM type 2 with diabetic peripheral neuropathy ([place]) 05/06/2020   Idiopathic peripheral neuropathy 01/15/2020   Generalized edema 01/15/2020   Primary pulmonary hypertension ([place]) 10/28/2019   Therapeutic drug monitoring 03/23/2018   Mild episode of recurrent major depressive disorder ([place]) 09/26/2017   Ventricular ectopic beats 09/26/2017   Type 2 diabetes mellitus with diabetic polyneuropathy, with long-term current use of insulin ([place]) 08/12/2017   Hyperlipidemia due to type 2 diabetes mellitus ([place]) 08/12/2017   B12 deficiency 12/14/2016   Low back pain 03/10/2016   Lumbosacral disc disease 01/01/2016   Neuropathy associated with endocrine disorder ([place]) 11/19/2015   B12 deficiency anemia 06/03/2015   Essential hypertension 06/03/2015   Hyperlipidemia 06/03/2015   Depression 06/03/2015   Gastroesophageal reflux disease without esophagitis 06/03/2015   Edema extremities 06/03/2015   Multiple sclerosis ([place]) 07/26/2013   Abnormality of gait 07/26/2013   Morbid obesity ([place]) 07/26/2013    Past Surgical History:  Procedure Laterality Date   COLECTOMY  06-2008   COLONOSCOPY  2015   normal   COLONOSCOPY WITH PROPOFOL N/A 09/10/2021   Procedure: COLONOSCOPY WITH PROPOFOL;  Surgeon: [name], MD;  Location: [facility] ENDOSCOPY;  Service: Endoscopy;  Laterality: N/A;   ESOPHAGOGASTRODUODENOSCOPY (EGD) WITH PROPOFOL N/A 09/10/2021   Procedure: ESOPHAGOGASTRODUODENOSCOPY (EGD) WITH  PROPOFOL;  Surgeon: [name], MD;  Location: [facility] ENDOSCOPY;  Service: Endoscopy;  Laterality: N/A;    Prior to Admission medications   Medication Sig Start Date End Date Taking? Authorizing Provider  albuterol (VENTOLIN) (5 MG/ML) 0.5% NEBU Take by nebulization continuous.    [provider]  aspirin 81 MG tablet Take 81 mg by mouth daily.     [provider]  B-D INSULIN SYRINGE 1CC/25GX1" 25G X 1" 1 ML MISC USE AS DIRECTED 06/22/21   Juline Patch, MD  BD PEN NEEDLE NANO U/F 32G X 4 MM MISC  06/18/19   [provider]  COMBIVENT RESPIMAT 20-100 MCG/ACT AERS respimat Inhale 1 puff into the lungs 4 (four) times daily. 05/22/21   [provider]  cyanocobalamin (,VITAMIN B-12,) 1000 MCG/ML injection Administer every 14 days Patient not taking: Reported on 09/15/2021 06/30/21   Juline Patch, MD  ferrous sulfate (FEROSUL) 325 (65 FE) MG tablet TAKE 1 TABLET(325 MG) BY MOUTH THREE TIMES DAILY Patient not taking: Reported on 07/14/2021 06/30/21   Juline Patch, MD  furosemide (LASIX) 40 MG tablet Take 1 tablet (40 mg total) by mouth every other day. Patient taking differently: Take 20 mg by mouth every other day. 07/07/20   Juline Patch, MD  gemfibrozil (LOPID) 600 MG tablet TAKE 1 TABLET(600 MG) BY MOUTH TWICE DAILY BEFORE A MEAL 06/30/21   Juline Patch, MD  Insulin Glargine (LANTUS) 100 UNIT/ML Solostar Pen Inject 32 Units into the skin daily. Dr Honor Junes 09/06/16   [provider]  Interferon Beta-1b (BETASERON) 0.3 MG KIT injection Inject subcutaneously 1 syringe every other day. 05/06/20   Marcial Pacas, MD  loratadine (CLARITIN) 10 MG tablet Take 1 tablet (10 mg total) by mouth daily. 06/30/21   Juline Patch, MD  losartan (COZAAR) 25 MG tablet Take 1 tablet by mouth daily. singh 11/26/19 07/14/21  [provider]  metFORMIN (GLUCOPHAGE) 500 MG tablet Take 1 tablet by mouth 2 (two) times daily. 09/08/17   [provider]  omeprazole (PRILOSEC) 40 MG capsule TAKE 1 CAPSULE BY MOUTH EVERY DAY 08/18/21   Juline Patch, MD  [facility] ULTRA test strip TEST TID 08/28/19   [provider]  pregabalin (LYRICA) 200 MG capsule Take 1 capsule (200 mg total) by mouth 2 (two) times daily. 06/30/21   Juline Patch, MD  sertraline (ZOLOFT) 50 MG tablet Take 1/2 tablet daily 06/30/21   Juline Patch, MD  simvastatin (ZOCOR) 40 MG tablet Take 1 tablet (40 mg total) by mouth daily. 08/06/21   Juline Patch, MD  sodium bicarbonate 650 MG tablet Take 650 mg by mouth 2 (two) times daily. 06/10/21   [provider]  traMADol (ULTRAM) 50 MG tablet Take 1 tablet (50 mg total) by mouth every 6 (six) hours as needed. 07/15/21   Marcial Pacas, MD    Allergies Betadine [povidone iodine]  Family History  Problem Relation Age of Onset   Lung cancer Mother    Heart attack Father    Heart attack Brother    COPD Brother    Diabetes Brother    Esophageal cancer Nephew     Social History Social History   Tobacco Use   Smoking status: Former    Packs/day: 1.50    Years: 30.00    Pack years: 45.00    Types: Cigarettes    Quit date: 01/23/2006    Years since quitting: 15.6   Smokeless tobacco:  Never   Tobacco comments:    N/A  Vaping Use   Vaping Use: Never used  Substance Use Topics   Alcohol use: Yes    Alcohol/week: 0.0 standard drinks    Comment: rare; maybe 2 beers a year   Drug use: No    Review of Systems  Review of Systems  Constitutional:  Positive for malaise/fatigue. Negative for chills and fever.  HENT:  Negative for sore throat.   Eyes:  Negative for pain.  Respiratory:  Negative for cough and stridor.   Cardiovascular:  Negative for chest pain.  Gastrointestinal:  Negative for vomiting.  Musculoskeletal:  Positive for back pain (lower back and hips).  Skin:  Negative for rash.  Neurological:  Negative for seizures, loss of consciousness and headaches.  Psychiatric/Behavioral:  Negative for suicidal ideas.   All other systems reviewed and are negative.    ____________________________________________   PHYSICAL EXAM:  VITAL SIGNS: ED Triage Vitals  Enc Vitals Group     BP 09/24/21 0933 (!) 161/66     Pulse Rate 09/24/21 0933 88     Resp 09/24/21 0933 20     Temp 09/24/21 0933 (!) 97.3 F (36.3 C)     Temp Source 09/24/21 0933 Oral      SpO2 09/24/21 0933 97 %     Weight 09/24/21 0935 209 lb 11.2 oz (95.1 kg)     Height 09/24/21 0935 5' 10"  (1.778 m)     Head Circumference --      Peak Flow --      Pain Score 09/24/21 0935 0     Pain Loc --      Pain Edu? --      Excl. in [place]? --    Vitals:   09/24/21 0933 09/24/21 1327  BP: (!) 161/66 132/74  Pulse: 88 97  Resp: 20 18  Temp: (!) 97.3 F (36.3 C)   SpO2: 97% 92%   Physical Exam Vitals and nursing note reviewed.  Constitutional:      General: He is not in acute distress.    Appearance: He is well-developed.  HENT:     Head: Normocephalic and atraumatic.     Right Ear: External ear normal.     Left Ear: External ear normal.     Nose: Nose normal.  Eyes:     Conjunctiva/sclera: Conjunctivae normal.  Cardiovascular:     Rate and Rhythm: Normal rate and regular rhythm.     Heart sounds: No murmur heard. Pulmonary:     Effort: Pulmonary effort is normal. No respiratory distress.     Breath sounds: Normal breath sounds.  Abdominal:     Palpations: Abdomen is soft.     Tenderness: There is no abdominal tenderness.  Musculoskeletal:        General: No swelling.     Cervical back: Neck supple.  Skin:    General: Skin is warm and dry.     Capillary Refill: Capillary refill takes less than 2 seconds.  Neurological:     Mental Status: He is alert.  Psychiatric:        Mood and Affect: Mood normal.    Mild tenderness over the L-spine and bilateral posterior hips with some ecchymosis on the right side. ____________________________________________   LABS (all labs ordered are listed, but only abnormal results are displayed)  Labs Reviewed  COMPREHENSIVE METABOLIC PANEL - Abnormal; Notable for the following components:      Result Value   Chloride 112 (*)  CO2 18 (*)    Glucose, Bld 102 (*)    BUN 37 (*)    Albumin 3.2 (*)    All other components within normal limits  RESP PANEL BY RT-PCR (FLU A&B, COVID) ARPGX2  PROTIME-INR  FOLATE  CELIAC  DISEASE PANEL  H. PYLORI ANTIGEN, STOOL  URINALYSIS, COMPLETE (UACMP) WITH MICROSCOPIC  PREPARE RBC (CROSSMATCH)   ____________________________________________  EKG  ____________________________________________  RADIOLOGY  ED MD interpretation:, Plain film of  Right hip shows no acute fracture or dislocation.  Patient refused imaging of his lower back and left hip as patient later stated he did not have any pain in his areas.  Official radiology report(s): DG HIP UNILAT WITH PELVIS 2-3 VIEWS RIGHT  Result Date: 09/24/2021 CLINICAL DATA:  Right hip pain after recent fall. EXAM: DG HIP (WITH OR WITHOUT PELVIS) 2-3V RIGHT COMPARISON:  None. FINDINGS: There is no evidence of hip fracture or dislocation. There is no evidence of arthropathy or other focal bone abnormality. IMPRESSION: Negative. Electronically Signed   By: Marijo Conception M.D.   On: 09/24/2021 12:19    ____________________________________________   PROCEDURES  Procedure(s) performed (including Critical Care):  .Critical Care Performed by: Lucrezia Starch, MD Authorized by: Lucrezia Starch, MD   Critical care provider statement:    Critical care time (minutes):  30   Critical care was necessary to treat or prevent imminent or life-threatening deterioration of the following conditions:  Circulatory failure   Critical care was time spent personally by me on the following activities:  Development of treatment plan with patient or surrogate, discussions with consultants, evaluation of patient's response to treatment, examination of patient, ordering and review of laboratory studies, ordering and review of radiographic studies, ordering and performing treatments and interventions, pulse oximetry, re-evaluation of patient's condition and review of old charts   ____________________________________________   INITIAL IMPRESSION / [place] / ED COURSE      Patient presents with above-stated history and exam for  assessment of low hemoglobin On outpatient labs earlier today.  This in the setting of chronic anemia and recent evaluation for same with colonoscopy and endoscopy.  Patient has also gotten some blood most recently noted to go.  He also endorses a fall about a week ago on his lower abdomen.  On arrival he is afebrile hemodynamically stable.  He does have some mild tenderness over his lower back and right hip no other significant tenderness or evidence of trauma on exam.  I suspect contusion x-ray shows no acute fracture or dislocation.   There is some Hemoccult positive stool on exam but no active bleeding on rectal exam.  Was able to review patient's labs obtained in clinic earlier today that showed a WBC count of 3.4, hemoglobin of 5.3 and normal platelets.  BMP emergency room shows a bicarb of 18 but no other significant electrolyte or metabolic derangements.  INR is unremarkable.  Given patient was noted to have some gastritis on his previous endoscopy and has some Hemoccult positive brown stool on exam we will start on Protonix.  Dr. Vicente Males with GI consulted will see patient.  2 units of PRBCs ordered.  I will plan to admit to medicine service for further evaluation and management.  Low suspicion for acute infectious process or other significant metabolic derangement.         ____________________________________________   FINAL CLINICAL IMPRESSION(S) / ED DIAGNOSES  Final diagnoses:  Fall, initial encounter  Low hemoglobin  Gastrointestinal hemorrhage,  unspecified gastrointestinal hemorrhage type    Medications  pantoprazole (PROTONIX) injection 40 mg (40 mg Intravenous Given 09/24/21 1307)  0.9 %  sodium chloride infusion (Manually program via Guardrails IV Fluids) ( Intravenous New Bag/Given 09/24/21 1315)     ED Discharge Orders     None        Note:  This document was prepared using Dragon voice recognition software and may include unintentional dictation errors.     Lucrezia Starch, MD 09/24/21 1414    Lucrezia Starch, MD 09/24/21 [phone]

## 2021-09-24 NOTE — ED Triage Notes (Signed)
Pt reports that he was at the cancer center they drew his labs and his HGB was 5.3, they sent him here to be evaluated. They day before thanksgiving they gave him a unit of blood. It has come down again. They did an endoscopy and colonoscopy two weeks ago and did not find any loss of blood.

## 2021-09-24 NOTE — Consult Note (Signed)
Jeremy [name] , MD [address], [place], [place], [place], [address] [address], [place], [place], [place], [address] Phone: [phone]  Fax: [phone]  Consultation  Referring Provider:    ER Primary Care Physician:  Juline Patch, MD Primary Gastroenterologist:  Dr. Allen Norris         Reason for Consultation:     Anemia   Date of Admission:  09/24/2021 Date of Consultation:  09/24/2021         HPI:   Jeremy [name] is a 70 y.o. male is a patient who has been seen by Dr. Allen Norris on 08/26/2021 for iron deficiency anemia.  She follows with Dr. He with hematology.  Recent hemoglobin on 07/17/2020 was 10.9 g with MCV of 92 and in 06/30/2021 was 8.2 With MCV of 102 with a low iron at 24 and ferritin of 115 normal B12 levels.  Did not respond to iron infusions and sent to GI. Patient had an EGD on 09/10/2021 That showed a small hiatal hernia and erythema in the gastric antrum biopsies were taken.  Several polyps were seen in the stomach biopsies were taken.   A colonoscopy was performed the same day Showed patent end-to-end colocolonic anastomosis otherwise no other abnormalities and a capsule study of the small bowel was requested  Labs checked today on admission 09/24/2021 hemoglobin 5.3 g 13 days back was 7.0 g, Iron low at 9 and ferritin low at 13 gradually dropped over the past 2 months.   Patient came for a follow-up at the cancer center today and had a hemoglobin checked and it was lower than baseline has been sent to the ER.  He states that his stool has always been brown in color.  It is only black when he takes his iron pills.  Denies any hematemesis.  No other visible blood loss.  Denies any NSAID use.  Denies any use of blood thinners.  Past Medical History:  Diagnosis Date   Chronic pain    Depression    Diabetes ([place])    GERD (gastroesophageal reflux disease)    Hyperlipemia    Hypertension    MS (multiple sclerosis) ([place])     Past Surgical History:  Procedure  Laterality Date   COLECTOMY  06-2008   COLONOSCOPY  2015   normal   COLONOSCOPY WITH PROPOFOL N/A 09/10/2021   Procedure: COLONOSCOPY WITH PROPOFOL;  Surgeon: [name], MD;  Location: [facility] ENDOSCOPY;  Service: Endoscopy;  Laterality: N/A;   ESOPHAGOGASTRODUODENOSCOPY (EGD) WITH PROPOFOL N/A 09/10/2021   Procedure: ESOPHAGOGASTRODUODENOSCOPY (EGD) WITH PROPOFOL;  Surgeon: [name], MD;  Location: [facility] ENDOSCOPY;  Service: Endoscopy;  Laterality: N/A;    Prior to Admission medications   Medication Sig Start Date End Date Taking? Authorizing Provider  albuterol (VENTOLIN) (5 MG/ML) 0.5% NEBU Take by nebulization continuous.    [provider]  aspirin 81 MG tablet Take 81 mg by mouth daily.    [provider]  B-D INSULIN SYRINGE 1CC/25GX1" 25G X 1" 1 ML MISC USE AS DIRECTED 06/22/21   Juline Patch, MD  BD PEN NEEDLE NANO U/F 32G X 4 MM MISC  06/18/19   [provider]  COMBIVENT RESPIMAT 20-100 MCG/ACT AERS respimat Inhale 1 puff into the lungs 4 (four) times daily. 05/22/21   [provider]  cyanocobalamin (,VITAMIN B-12,) 1000 MCG/ML injection Administer every 14 days Patient not taking: Reported on 09/15/2021 06/30/21   Juline Patch, MD  ferrous sulfate (FEROSUL) 325 (65  FE) MG tablet TAKE 1 TABLET(325 MG) BY MOUTH THREE TIMES DAILY Patient not taking: Reported on 07/14/2021 06/30/21   Juline Patch, MD  furosemide (LASIX) 40 MG tablet Take 1 tablet (40 mg total) by mouth every other day. Patient taking differently: Take 20 mg by mouth every other day. 07/07/20   Juline Patch, MD  gemfibrozil (LOPID) 600 MG tablet TAKE 1 TABLET(600 MG) BY MOUTH TWICE DAILY BEFORE A MEAL 06/30/21   Juline Patch, MD  Insulin Glargine (LANTUS) 100 UNIT/ML Solostar Pen Inject 32 Units into the skin daily. Dr Honor Junes 09/06/16   [provider]  Interferon Beta-1b (BETASERON) 0.3 MG KIT injection Inject subcutaneously 1 syringe every other day. 05/06/20    Marcial Pacas, MD  loratadine (CLARITIN) 10 MG tablet Take 1 tablet (10 mg total) by mouth daily. 06/30/21   Juline Patch, MD  losartan (COZAAR) 25 MG tablet Take 1 tablet by mouth daily. singh 11/26/19 07/14/21  [provider]  metFORMIN (GLUCOPHAGE) 500 MG tablet Take 1 tablet by mouth 2 (two) times daily. 09/08/17   [provider]  omeprazole (PRILOSEC) 40 MG capsule TAKE 1 CAPSULE BY MOUTH EVERY DAY 08/18/21   Juline Patch, MD  [facility] ULTRA test strip TEST TID 08/28/19   [provider]  pregabalin (LYRICA) 200 MG capsule Take 1 capsule (200 mg total) by mouth 2 (two) times daily. 06/30/21   Juline Patch, MD  sertraline (ZOLOFT) 50 MG tablet Take 1/2 tablet daily 06/30/21   Juline Patch, MD  simvastatin (ZOCOR) 40 MG tablet Take 1 tablet (40 mg total) by mouth daily. 08/06/21   Juline Patch, MD  sodium bicarbonate 650 MG tablet Take 650 mg by mouth 2 (two) times daily. 06/10/21   [provider]  traMADol (ULTRAM) 50 MG tablet Take 1 tablet (50 mg total) by mouth every 6 (six) hours as needed. 07/15/21   Marcial Pacas, MD    Family History  Problem Relation Age of Onset   Lung cancer Mother    Heart attack Father    Heart attack Brother    COPD Brother    Diabetes Brother    Esophageal cancer Nephew      Social History   Tobacco Use   Smoking status: Former    Packs/day: 1.50    Years: 30.00    Pack years: 45.00    Types: Cigarettes    Quit date: 01/23/2006    Years since quitting: 15.6   Smokeless tobacco: Never   Tobacco comments:    N/A  Vaping Use   Vaping Use: Never used  Substance Use Topics   Alcohol use: Yes    Alcohol/week: 0.0 standard drinks    Comment: rare; maybe 2 beers a year   Drug use: No    Allergies as of 09/24/2021 - Review Complete 09/24/2021  Allergen Reaction Noted   Betadine [povidone iodine]  07/21/2013    Review of Systems:    All systems reviewed and negative except where noted in HPI.   Physical  Exam:  Vital signs in last 24 hours: Temp:  [97 F (36.1 C)-97.3 F (36.3 C)] 97.3 F (36.3 C) (12/01 0933) Pulse Rate:  [84-97] 97 (12/01 1327) Resp:  [18-20] 18 (12/01 1327) BP: (132-161)/(54-74) 132/74 (12/01 1327) SpO2:  [92 %-97 %] 92 % (12/01 1327) Weight:  [95.1 kg] 95.1 kg (12/01 0935)   General:   Pleasant, cooperative in NAD Head:  Normocephalic and atraumatic. Eyes:  No icterus.   Conjunctiva pink. PERRLA. Ears:  Normal auditory acuity. Neck:  Supple; no masses or thyroidomegaly Lungs: Respirations even and unlabored. Lungs clear to auscultation bilaterally.   No wheezes, crackles, or rhonchi.  Heart:  Regular rate and rhythm;  Without murmur, clicks, rubs or gallops Abdomen:  Soft, nondistended, nontender. Normal bowel sounds. No appreciable masses or hepatomegaly.  No rebound or guarding.  Neurologic:  Alert and oriented x3;  grossly normal neurologically. Skin:  Intact without significant lesions or rashes. Cervical Nodes:  No significant cervical adenopathy. Psych:  Alert and cooperative. Normal affect.  LAB RESULTS: Recent Labs    09/24/21 0820  WBC 3.4*  HGB 5.3*  HCT 18.1*  PLT 156   BMET Recent Labs    09/24/21 1252  NA 137  K 4.8  CL 112*  CO2 18*  GLUCOSE 102*  BUN 37*  CREATININE 1.24  CALCIUM 9.1   LFT Recent Labs    09/24/21 1252  PROT 6.6  ALBUMIN 3.2*  AST 16  ALT 10  ALKPHOS 52  BILITOT 0.4   PT/INR Recent Labs    09/24/21 1252  LABPROT 15.0  INR 1.2    STUDIES: DG HIP UNILAT WITH PELVIS 2-3 VIEWS RIGHT  Result Date: 09/24/2021 CLINICAL DATA:  Right hip pain after recent fall. EXAM: DG HIP (WITH OR WITHOUT PELVIS) 2-3V RIGHT COMPARISON:  None. FINDINGS: There is no evidence of hip fracture or dislocation. There is no evidence of arthropathy or other focal bone abnormality. IMPRESSION: Negative. Electronically Signed   By: Marijo Conception M.D.   On: 09/24/2021 12:19      Impression / Plan:   Ithan Touhey is a 70  y.o. y/o male who was recently seen by Dr. Allen Norris and underwent EGD and colonoscopy in November 2022 for anemia.  No abnormality noted.  He was scheduled for an outpatient capsule study of small bowel on 10/01/2021.  In the interim had a hemoglobin that was checked and was found to be lower than baseline at 5 g and found to have a very low iron level with a ferritin of less than 15 and sent to the ER.  This patient has severe iron deficiency anemia but macrocytosis of the labs.   Plan  1.  Celiac serology, check serum folate, urine analysis, H. pylori stool antigen, 2.  Capsule study of the small bowel to be performed tomorrow 3.  IV iron as recommended 4.  monitor CBC and transfuse as needed. 5.  If there is concern for bleeding obtain a tagged RBC scan or CT angiogram  Risks, benefits, alternatives of Givens capsule discussed with patient to include but not limited to the rare risk of Given's capsule becoming lodged in the GI tract requiring surgical removal.  The patient agrees with this plan & consent will be obtained.    Thank you for involving me in the care of this patient.      LOS: 0 days   Jeremy Bellows, MD  09/24/2021, 3:42 PM

## 2021-09-24 NOTE — H&P (Signed)
History and Physical    Jeremy [name] IWL:[id number] DOB: [birth year] DOA: 09/24/2021  PCP: Juline Patch, MD   Patient coming from: Home  I have personally briefly reviewed patient's old medical records in [place]  Chief Complaint: Weakness  HPI: Jeremy [name] is a 70 y.o. 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 Patient complains of generalized weakness as well as exertional shortness of breath which he said is unchanged from his baseline.  He denies having any hematemesis, no hematochezia or melena stools.  He denies having abdominal pain or any NSAID use.  He denies any unintentional weight loss or poor oral intake.  He denies having any changes in his bowel habits. He has a history of anemia and gets iron transfusion at the cancer center.  He recently had a blood transfusion. He had a recent upper and lower endoscopy which showed gastritis and nonbleeding internal hemorrhoids. He denies having any fever, no chills, no cough, no dizziness, no lightheadedness, no leg swelling, no headache, no focal deficits or blurred vision. He admits to having a recent fall and denies having any loss of consciousness or hitting his head.  He landed on his right hip and complains of some soreness in that area. Sodium 137, potassium 4.8, chloride 112, bicarb 18, glucose 102, BUN 37, creatinine 1.24, calcium 9.1, alkaline phosphatase 52, albumin 3.2, AST 16, ALT 10, total protein 6.6, serum iron 9, total iron-binding capacity 174, ferritin 2, folic acid 9.3, white count 3.4, hemoglobin 5.3, hematocrit 18.1, MCV 92.8, RDW 16.3, platelet count 156, PT 15.0, INR 1.2 Respiratory viral panel is negative Right hip x-ray shows no evidence  of hip fracture or dislocation.     ED Course: Patient is a 70 year old male who presents to the ER from the cancer center for evaluation of symptomatic anemia. Patient noted to have a hemoglobin of 5.3 g per DL and requires transfusion. He will be referred to observation status for further evaluation.   Review of Systems: As per HPI otherwise all other systems reviewed and negative.    Past Medical History:  Diagnosis Date   Chronic pain    Depression    Diabetes ([place])    GERD (gastroesophageal reflux disease)    Hyperlipemia    Hypertension    MS (multiple sclerosis) ([place])     Past Surgical History:  Procedure Laterality Date   COLECTOMY  06-2008   COLONOSCOPY  2015   normal   COLONOSCOPY WITH PROPOFOL N/A 09/10/2021   Procedure: COLONOSCOPY WITH PROPOFOL;  Surgeon: [name], MD;  Location: [facility] ENDOSCOPY;  Service: Endoscopy;  Laterality: N/A;   ESOPHAGOGASTRODUODENOSCOPY (EGD) WITH PROPOFOL N/A 09/10/2021   Procedure: ESOPHAGOGASTRODUODENOSCOPY (EGD) WITH PROPOFOL;  Surgeon: [name], MD;  Location: [facility] ENDOSCOPY;  Service: Endoscopy;  Laterality: N/A;     reports that he quit smoking about 15 years ago. His smoking use included cigarettes. He has a 45.00 pack-year smoking history. He has never used smokeless tobacco. He reports current alcohol use. He reports that he does not use drugs.  Allergies  Allergen Reactions   Betadine [Povidone Iodine]     Family History  Problem Relation Age of Onset   Lung cancer Mother    Heart attack Father    Heart  attack Brother    COPD Brother    Diabetes Brother    Esophageal cancer Nephew       Prior to Admission medications   Medication Sig Start Date End Date Taking? Authorizing Provider  albuterol (VENTOLIN) (5 MG/ML) 0.5% NEBU Take by nebulization continuous.    [provider]  aspirin 81 MG tablet Take 81 mg by mouth daily.    [provider]  B-D INSULIN SYRINGE 1CC/25GX1" 25G X 1" 1 ML  MISC USE AS DIRECTED 06/22/21   Juline Patch, MD  COMBIVENT RESPIMAT 20-100 MCG/ACT AERS respimat Inhale 1 puff into the lungs 4 (four) times daily. 05/22/21   [provider]  cyanocobalamin (,VITAMIN B-12,) 1000 MCG/ML injection Administer every 14 days Patient not taking: Reported on 09/15/2021 06/30/21   Juline Patch, MD  ferrous sulfate (FEROSUL) 325 (65 FE) MG tablet TAKE 1 TABLET(325 MG) BY MOUTH THREE TIMES DAILY Patient not taking: Reported on 07/14/2021 06/30/21   Juline Patch, MD  furosemide (LASIX) 40 MG tablet Take 1 tablet (40 mg total) by mouth every other day. Patient taking differently: Take 20 mg by mouth every other day. 07/07/20   Juline Patch, MD  gemfibrozil (LOPID) 600 MG tablet TAKE 1 TABLET(600 MG) BY MOUTH TWICE DAILY BEFORE A MEAL 06/30/21   Juline Patch, MD  Insulin Glargine (LANTUS) 100 UNIT/ML Solostar Pen Inject 32 Units into the skin daily. Dr Honor Junes 09/06/16   [provider]  Interferon Beta-1b (BETASERON) 0.3 MG KIT injection Inject subcutaneously 1 syringe every other day. 05/06/20   Marcial Pacas, MD  loratadine (CLARITIN) 10 MG tablet Take 1 tablet (10 mg total) by mouth daily. 06/30/21   Juline Patch, MD  losartan (COZAAR) 25 MG tablet Take 1 tablet by mouth daily. singh 11/26/19 07/14/21  [provider]  metFORMIN (GLUCOPHAGE) 500 MG tablet Take 1 tablet by mouth 2 (two) times daily. 09/08/17   [provider]  omeprazole (PRILOSEC) 40 MG capsule TAKE 1 CAPSULE BY MOUTH EVERY DAY 08/18/21   Juline Patch, MD  pregabalin (LYRICA) 200 MG capsule Take 1 capsule (200 mg total) by mouth 2 (two) times daily. 06/30/21   Juline Patch, MD  sertraline (ZOLOFT) 50 MG tablet Take 1/2 tablet daily 06/30/21   Juline Patch, MD  simvastatin (ZOCOR) 40 MG tablet Take 1 tablet (40 mg total) by mouth daily. 08/06/21   Juline Patch, MD  sodium bicarbonate 650 MG tablet Take 650 mg by mouth 2 (two) times daily. 06/10/21   [provider]  traMADol (ULTRAM) 50 MG tablet Take 1 tablet (50 mg total) by mouth every 6 (six) hours as needed. 07/15/21   Marcial Pacas, MD    Physical Exam: Vitals:   09/24/21 0933 09/24/21 0935 09/24/21 1327  BP: (!) 161/66  132/74  Pulse: 88  97  Resp: 20  18  Temp: (!) 97.3 F (36.3 C)    TempSrc: Oral  Oral  SpO2: 97%  92%  Weight:  95.1 kg   Height:  _0  (1.778 m)      Vitals:   09/24/21 0933 09/24/21 0935 09/24/21 1327  BP: (!) 161/66  132/74  Pulse: 88  97  Resp: 20  18  Temp: (!) 97.3 F (36.3 C)    TempSrc: Oral  Oral  SpO2: 97%  92%  Weight:  95.1 kg   Height:  _1  (1.778 m)  Constitutional: Alert and oriented x 3 . Not in any apparent distress.  Chronically ill-appearing HEENT:      Head: Normocephalic and atraumatic.         Eyes: PERLA, EOMI, Conjunctivae pallor. Sclera is non-icteric.       Mouth/Throat: Mucous membranes are moist.       Neck: Supple with no signs of meningismus. Cardiovascular: Regular rate and rhythm. No murmurs, gallops, or rubs. 2+ symmetrical distal pulses are present . No JVD.  Trace LE edema Respiratory: Respiratory effort normal .Lungs sounds clear bilaterally. No wheezes, crackles, or rhonchi.  Gastrointestinal: Soft, non tender, and non distended with positive bowel sounds.  Genitourinary: No CVA tenderness. Musculoskeletal: Nontender with normal range of motion in all extremities. No cyanosis, or erythema of extremities. Neurologic:  Face is symmetric. Moving all extremities. No gross focal neurologic deficits .  Generalized weakness Skin: Skin is warm, dry.  No rash or ulcers Psychiatric: Mood and affect are normal    Labs on Admission: I have personally reviewed following labs and imaging studies  CBC: Recent Labs  Lab 09/24/21 0820  WBC 3.4*  NEUTROABS 2.3  HGB 5.3*  HCT 18.1*  MCV 92.8  PLT 341   Basic Metabolic Panel: Recent Labs  Lab 09/24/21 1252  NA 137  K 4.8  CL 112*  CO2 18*   GLUCOSE 102*  BUN 37*  CREATININE 1.24  CALCIUM 9.1   GFR: Estimated Creatinine Clearance: 65.1 mL/min (by C-G formula based on SCr of 1.24 mg/dL). Liver Function Tests: Recent Labs  Lab 09/24/21 1252  AST 16  ALT 10  ALKPHOS 52  BILITOT 0.4  PROT 6.6  ALBUMIN 3.2*   No results for input(s): LIPASE, AMYLASE in the last 168 hours. No results for input(s): AMMONIA in the last 168 hours. Coagulation Profile: Recent Labs  Lab 09/24/21 1252  INR 1.2   Cardiac Enzymes: No results for input(s): CKTOTAL, CKMB, CKMBINDEX, TROPONINI in the last 168 hours. BNP (last 3 results) No results for input(s): PROBNP in the last 8760 hours. HbA1C: No results for input(s): HGBA1C in the last 72 hours. CBG: No results for input(s): GLUCAP in the last 168 hours. Lipid Profile: No results for input(s): CHOL, HDL, LDLCALC, TRIG, CHOLHDL, LDLDIRECT in the last 72 hours. Thyroid Function Tests: No results for input(s): TSH, T4TOTAL, FREET4, T3FREE, THYROIDAB in the last 72 hours. Anemia Panel: Recent Labs    09/24/21 0820 09/24/21 1252  FOLATE  --  9.3  FERRITIN 13*  --   TIBC 496*  --   IRON 9*  --    Urine analysis:    Component Value Date/Time   COLORURINE YELLOW 07/23/2021 1342   APPEARANCEUR HAZY (A) 07/23/2021 1342   APPEARANCEUR Hazy 08/27/2013 1157   LABSPEC 1.015 07/23/2021 1342   LABSPEC 1.013 08/27/2013 1157   PHURINE 5.5 07/23/2021 1342   GLUCOSEU NEGATIVE 07/23/2021 1342   GLUCOSEU Negative 08/27/2013 1157   HGBUR NEGATIVE 07/23/2021 1342   BILIRUBINUR NEGATIVE 07/23/2021 1342   BILIRUBINUR Negative 08/27/2013 1157   KETONESUR NEGATIVE 07/23/2021 1342   PROTEINUR 30 (A) 07/23/2021 1342   NITRITE NEGATIVE 07/23/2021 1342   LEUKOCYTESUR NEGATIVE 07/23/2021 1342   LEUKOCYTESUR Negative 08/27/2013 1157    Radiological Exams on Admission: DG HIP UNILAT WITH PELVIS 2-3 VIEWS RIGHT  Result Date: 09/24/2021 CLINICAL DATA:  Right hip pain after recent fall. EXAM: DG  HIP (WITH OR WITHOUT PELVIS) 2-3V RIGHT COMPARISON:  None. FINDINGS: There is no evidence of hip  fracture or dislocation. There is no evidence of arthropathy or other focal bone abnormality. IMPRESSION: Negative. Electronically Signed   By: Marijo Conception M.D.   On: 09/24/2021 12:19     Assessment/Plan Principal Problem:   Symptomatic anemia Active Problems:   Multiple sclerosis (HCC)   Essential hypertension   Depression   DM type 2 with diabetic peripheral neuropathy (HCC)   Chronic kidney disease, stage 3 unspecified ([place])    Patient is a 70 year old male admitted to the hospital for symptomatic anemia    Symptomatic iron deficiency anemia Patient with a history of acute on chronic anemia requiring blood transfusions. Noted to have a hemoglobin of 5.3 g per DL Will transfuse 2 units of packed RBC Continue Protonix initiated in the ER GI consult    History of multiple sclerosis Stable Continue interferon beta injections every other day    Hypertension Blood pressure stable Continue Cozaar    Depression Stable Continue sertraline    Diabetes mellitus Continue Lantus but decrease dose to one half of scheduled dose Glycemic control with sliding scale insulin     COPD with chronic respiratory failure Stable and not acutely exacerbated Continue as needed bronchodilator therapy Continue 2 L of oxygen as needed   DVT prophylaxis: SCD  Code Status: full code  Family Communication: Greater than 50% of time was spent discussing patient's condition and plan of care with him and his wife at the bedside.  All questions and concerns have been addressed.  He verbalized understanding and agree with plan. Disposition Plan: Back to previous home environment Consults called: Gastroenterology Status:Observation    Collier Bullock MD [facility]     09/24/2021, 2:52 PM

## 2021-09-24 NOTE — ED Triage Notes (Signed)
Blood and type and screen was done at the cancer center today

## 2021-09-24 NOTE — Progress Notes (Signed)
Pt arrived to clinic for possible blood transfusion. Hgb 5.3 in clinic today. Dr Tasia Catchings notified via secure chat and advised pt to go to ER. Pt made aware of plan and verbalized understanding. VSS.

## 2021-09-25 ENCOUNTER — Other Ambulatory Visit: Payer: Self-pay | Admitting: *Deleted

## 2021-09-25 ENCOUNTER — Encounter: Payer: Self-pay | Admitting: Certified Registered Nurse Anesthetist

## 2021-09-25 ENCOUNTER — Ambulatory Visit: Admit: 2021-09-25 | Payer: Medicare Other | Admitting: Gastroenterology

## 2021-09-25 ENCOUNTER — Encounter
Admission: EM | Disposition: A | Discharge status: Home / Self Care | Payer: Self-pay | Source: Home / Self Care | Attending: Internal Medicine

## 2021-09-25 DIAGNOSIS — D500 Iron deficiency anemia secondary to blood loss (chronic): Secondary | ICD-10-CM | POA: Diagnosis not present

## 2021-09-25 DIAGNOSIS — G35 Multiple sclerosis: Secondary | ICD-10-CM

## 2021-09-25 DIAGNOSIS — D649 Anemia, unspecified: Secondary | ICD-10-CM | POA: Diagnosis not present

## 2021-09-25 HISTORY — PX: GIVENS CAPSULE STUDY: SHX5432

## 2021-09-25 LAB — CBC
HCT: 22.9 % — ABNORMAL LOW (ref 39.0–52.0)
Hemoglobin: 7.2 g/dL — ABNORMAL LOW (ref 13.0–17.0)
MCH: 26.7 pg (ref 26.0–34.0)
MCHC: 31.4 g/dL (ref 30.0–36.0)
MCV: 84.8 fL (ref 80.0–100.0)
Platelets: 153 10*3/uL (ref 150–400)
RBC: 2.7 MIL/uL — ABNORMAL LOW (ref 4.22–5.81)
RDW: 15.8 % — ABNORMAL HIGH (ref 11.5–15.5)
WBC: 3.6 10*3/uL — ABNORMAL LOW (ref 4.0–10.5)
nRBC: 0 % (ref 0.0–0.2)

## 2021-09-25 LAB — BASIC METABOLIC PANEL
Anion gap: 5 (ref 5–15)
BUN: 33 mg/dL — ABNORMAL HIGH (ref 8–23)
CO2: 21 mmol/L — ABNORMAL LOW (ref 22–32)
Calcium: 8.7 mg/dL — ABNORMAL LOW (ref 8.9–10.3)
Chloride: 111 mmol/L (ref 98–111)
Creatinine, Ser: 1.24 mg/dL (ref 0.61–1.24)
GFR, Estimated: >60 mL/min (ref >60)
Glucose, Bld: 93 mg/dL (ref 70–99)
Potassium: 4 mmol/L (ref 3.5–5.1)
Sodium: 137 mmol/L (ref 135–145)

## 2021-09-25 LAB — GLUCOSE, CAPILLARY
Glucose-Capillary: 96 mg/dL (ref 70–99)
Glucose-Capillary: 112 mg/dL — ABNORMAL HIGH (ref 70–99)
Glucose-Capillary: 222 mg/dL — ABNORMAL HIGH (ref 70–99)
Glucose-Capillary: 162 mg/dL — ABNORMAL HIGH (ref 70–99)

## 2021-09-25 LAB — VITAMIN B12: Vitamin B-12: 728 pg/mL (ref 180–914)

## 2021-09-25 LAB — HIV ANTIBODY (ROUTINE TESTING W REFLEX): HIV Screen 4th Generation wRfx: NONREACTIVE

## 2021-09-25 SURGERY — IMAGING PROCEDURE, GI TRACT, INTRALUMINAL, VIA CAPSULE

## 2021-09-25 MED ORDER — SODIUM CHLORIDE 0.9 % IV SOLN
300.0000 mg | Freq: Once | INTRAVENOUS | Status: AC
  Administered 2021-09-25: 300 mg via INTRAVENOUS
  Filled 2021-09-25: qty 300

## 2021-09-25 MED ORDER — INTERFERON BETA-1B 0.3 MG ~~LOC~~ KIT
0.2500 mg | PACK | SUBCUTANEOUS | Status: DC
  Administered 2021-09-25: 0.25 mg via SUBCUTANEOUS
  Filled 2021-09-25: qty 0.3

## 2021-09-25 NOTE — Plan of Care (Signed)

## 2021-09-25 NOTE — Progress Notes (Signed)
PROGRESS NOTE    Jeremy [name]  IFO:[id number] DOB: Nov 19, 1950 DOA: 09/24/2021 PCP: Juline Patch, MD   Chief complaint.  Symptomatic anemia. Brief Narrative:  Jeremy [name] is a 70 y.o. 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 Patient received 2 units PRBC, IV iron.   Capsule endoscopy is started on 12/1.   Assessment & Plan:   Principal Problem:   Symptomatic anemia Active Problems:   Multiple sclerosis (HCC)   Essential hypertension   Depression   DM type 2 with diabetic peripheral neuropathy (HCC)   Chronic kidney disease, stage 3 unspecified (HCC)  Severe iron deficient anemia. B12 level normal.  Patient received 2 units PRBC yesterday.  We will give IV iron today. No evidence of GI bleed, discontinue Protonix. Patient has been seen by GI, capsule endoscopy started today. Discussed with daughter Vicente Males, who will review endoscopy results tomorrow.  He wished to keep patient overnight in case patient has worsening anemia tomorrow.  Recheck hemoglobin tomorrow.  Multiple sclerosis. Continue interferon beta injections.  Essential hypertension. Stable.      DVT prophylaxis: SCDs Code Status: full Family Communication: Wife updated at bedside. Disposition Plan:    Status is: Observation        I/O last 3 completed shifts: In: 6767 [I.V.:100; Blood:1358] Out: 200 [Urine:200] Total I/O In: 71.3 [IV Piggyback:71.3] Out: -      Consultants:  GI  Procedures: Capsule endoscopy.  Antimicrobials: None  Subjective: Patient feels tired, but not short of breath. Denies any black stool or rectal bleeding. No skin rash. Denies any fever or chills. No abdominal pain or nausea vomiting. No  dysuria or hematuria.  Objective: Vitals:   09/25/21 0609 09/25/21 0609 09/25/21 0746 09/25/21 1043  BP: (!) 132/47 (!) 132/47 (!) 148/66 (!) 151/63  Pulse: 81 81 84 84  Resp: 17 19 20 20   Temp: 98.4 F (36.9 C) 98.4 F (36.9 C) 97.9 F (36.6 C) 98.3 F (36.8 C)  TempSrc: Oral Oral Oral Oral  SpO2: 96% 93% 93% 93%  Weight:      Height:        Intake/Output Summary (Last 24 hours) at 09/25/2021 1318 Last data filed at 09/25/2021 1300 Gross per 24 hour  Intake 1529.3 ml  Output 200 ml  Net 1329.3 ml   Filed Weights   09/24/21 0935 09/25/21 0218  Weight: 95.1 kg 98.3 kg    Examination:  General exam: Appears calm and comfortable  Respiratory system: Clear to auscultation. Respiratory effort normal. Cardiovascular system: S1 & S2 heard, RRR. No JVD, murmurs, rubs, gallops or clicks. No pedal edema. Gastrointestinal system: Abdomen is nondistended, soft and nontender. No organomegaly or masses felt. Normal bowel sounds heard. Central nervous system: Alert and oriented x3. No focal neurological deficits. Extremities: Symmetric 5 x 5 power. Skin: No rashes, lesions or ulcers Psychiatry: Mood & affect appropriate.     Data Reviewed: I have personally reviewed following labs and imaging studies  CBC: Recent Labs  Lab 09/24/21 0820 09/25/21 0718  WBC 3.4* 3.6*  NEUTROABS 2.3  --   HGB 5.3* 7.2*  HCT 18.1* 22.9*  MCV 92.8 84.8  PLT 156 209   Basic Metabolic Panel: Recent Labs  Lab 09/24/21 1252 09/25/21 0718  NA 137 137  K 4.8 4.0  CL 112* 111  CO2 18* 21*  GLUCOSE 102* 93  BUN 37* 33*  CREATININE 1.24 1.24  CALCIUM 9.1 8.7*   GFR: Estimated Creatinine Clearance: 66.1 mL/min (by C-G formula based on SCr of 1.24 mg/dL). Liver Function Tests: Recent Labs  Lab 09/24/21 1252  AST 16  ALT 10  ALKPHOS 52  BILITOT 0.4  PROT 6.6  ALBUMIN 3.2*   No results for input(s): LIPASE, AMYLASE in the last 168 hours. No results for input(s): AMMONIA in the last  168 hours. Coagulation Profile: Recent Labs  Lab 09/24/21 1252  INR 1.2   Cardiac Enzymes: No results for input(s): CKTOTAL, CKMB, CKMBINDEX, TROPONINI in the last 168 hours. BNP (last 3 results) No results for input(s): PROBNP in the last 8760 hours. HbA1C: No results for input(s): HGBA1C in the last 72 hours. CBG: Recent Labs  Lab 09/24/21 2254 09/25/21 0747 09/25/21 1044  GLUCAP 116* 96 112*   Lipid Profile: No results for input(s): CHOL, HDL, LDLCALC, TRIG, CHOLHDL, LDLDIRECT in the last 72 hours. Thyroid Function Tests: No results for input(s): TSH, T4TOTAL, FREET4, T3FREE, THYROIDAB in the last 72 hours. Anemia Panel: Recent Labs    09/24/21 0820 09/24/21 1252 09/25/21 0718  VITAMINB12  --   --  728  FOLATE  --  9.3  --   FERRITIN 13*  --   --   TIBC 496*  --   --   IRON 9*  --   --    Sepsis Labs: No results for input(s): PROCALCITON, LATICACIDVEN in the last 168 hours.  Recent Results (from the past 240 hour(s))  Resp Panel by RT-PCR (Flu A&B, Covid) Nasopharyngeal Swab     Status: None   Collection Time: 09/24/21 12:52 PM   Specimen: Nasopharyngeal Swab; Nasopharyngeal(NP) swabs in vial transport medium  Result Value Ref Range Status   SARS Coronavirus 2 by RT PCR NEGATIVE NEGATIVE Final    Comment: (NOTE) SARS-CoV-2 target nucleic acids are NOT DETECTED.  The SARS-CoV-2 RNA is generally detectable in upper respiratory specimens during the acute phase of infection. The lowest concentration of SARS-CoV-2 viral copies this assay can detect is 138 copies/mL. A negative result does not preclude SARS-Cov-2 infection and should not be used as the sole basis for treatment or other patient management decisions. A negative result may occur with  improper specimen collection/handling, submission of specimen other than nasopharyngeal swab, presence of viral mutation(s) within the areas targeted by this assay, and inadequate number of viral copies(<138  copies/mL). A negative result must be combined with clinical observations, patient history, and epidemiological information. The expected result is Negative.  Fact Sheet for Patients:  EntrepreneurPulse.com.au  Fact Sheet for Healthcare Providers:  IncredibleEmployment.be  This test is no t yet approved or cleared by the [place] FDA and  has been authorized for detection and/or diagnosis of SARS-CoV-2 by FDA under an Emergency Use Authorization (EUA). This EUA will remain  in effect (meaning this test can be used) for the duration of the COVID-19 declaration under Section 564(b)(1) of the Act, 21 U.S.C.section 360bbb-3(b)(1), unless the authorization is terminated  or revoked sooner.       Influenza A by PCR NEGATIVE NEGATIVE Final   Influenza B by PCR NEGATIVE NEGATIVE Final    Comment: (NOTE) The Xpert Xpress SARS-CoV-2/FLU/RSV plus assay is intended as an aid in the diagnosis of influenza from Nasopharyngeal swab specimens and should not be used as a sole basis for treatment. Nasal  washings and aspirates are unacceptable for Xpert Xpress SARS-CoV-2/FLU/RSV testing.  Fact Sheet for Patients: EntrepreneurPulse.com.au  Fact Sheet for Healthcare Providers: IncredibleEmployment.be  This test is not yet approved or cleared by the [place] FDA and has been authorized for detection and/or diagnosis of SARS-CoV-2 by FDA under an Emergency Use Authorization (EUA). This EUA will remain in effect (meaning this test can be used) for the duration of the COVID-19 declaration under Section 564(b)(1) of the Act, 21 U.S.C. section 360bbb-3(b)(1), unless the authorization is terminated or revoked.  Performed at [facility], [address] [place], [place] [address]          Radiology Studies: DG HIP UNILAT WITH PELVIS 2-3 VIEWS RIGHT  Result Date: 09/24/2021 CLINICAL DATA:  Right hip  pain after recent fall. EXAM: DG HIP (WITH OR WITHOUT PELVIS) 2-3V RIGHT COMPARISON:  None. FINDINGS: There is no evidence of hip fracture or dislocation. There is no evidence of arthropathy or other focal bone abnormality. IMPRESSION: Negative. Electronically Signed   By: Marijo Conception M.D.   On: 09/24/2021 12:19        Scheduled Meds:  furosemide  20 mg Oral QODAY   gemfibrozil  600 mg Oral BID AC   insulin aspart  0-15 Units Subcutaneous TID WC   insulin glargine-yfgn  16 Units Subcutaneous QHS   Interferon Beta-1b  0.25 mg Subcutaneous QODAY   ipratropium-albuterol  3 mL Inhalation QID   loratadine  10 mg Oral Daily   losartan  25 mg Oral Daily   pregabalin  200 mg Oral BID   sertraline  25 mg Oral Daily   simvastatin  40 mg Oral QPM   sodium bicarbonate  650 mg Oral BID   sodium chloride flush  3 mL Intravenous Q12H   Continuous Infusions:  sodium chloride     iron sucrose 300 mg (09/25/21 1234)     LOS: 0 days    Time spent: 27 minutes    Sharen Hones, MD [facility]   To contact the attending provider between 7A-7P or the covering provider during after hours 7P-7A, please log into the web site [email or web address] and access using universal [place] password for that web site. If you do not have the password, please call the hospital operator.  09/25/2021, 1:18 PM

## 2021-09-25 NOTE — Progress Notes (Signed)
PT Cancellation Note  Patient Details Name: Jeremy [name] MRN: [id number] DOB: 09/25/1951   Cancelled Treatment:    Reason Eval/Treat Not Completed: Patient not medically ready (Pt here for ABLA, now s/p units PRBC. First CBC showing Hb: 7.2, still waiting for 2nd CBC. WIll reattempt eval at later date/time.)   [name] C PT  09/25/2021, 3:58 PM

## 2021-09-26 DIAGNOSIS — Z87891 Personal history of nicotine dependence: Secondary | ICD-10-CM | POA: Diagnosis not present

## 2021-09-26 DIAGNOSIS — D72819 Decreased white blood cell count, unspecified: Secondary | ICD-10-CM | POA: Diagnosis present

## 2021-09-26 DIAGNOSIS — G35 Multiple sclerosis: Secondary | ICD-10-CM | POA: Diagnosis present

## 2021-09-26 DIAGNOSIS — C9000 Multiple myeloma not having achieved remission: Secondary | ICD-10-CM | POA: Diagnosis present

## 2021-09-26 DIAGNOSIS — K219 Gastro-esophageal reflux disease without esophagitis: Secondary | ICD-10-CM | POA: Diagnosis present

## 2021-09-26 DIAGNOSIS — J449 Chronic obstructive pulmonary disease, unspecified: Secondary | ICD-10-CM | POA: Diagnosis present

## 2021-09-26 DIAGNOSIS — D500 Iron deficiency anemia secondary to blood loss (chronic): Secondary | ICD-10-CM | POA: Diagnosis present

## 2021-09-26 DIAGNOSIS — E1142 Type 2 diabetes mellitus with diabetic polyneuropathy: Secondary | ICD-10-CM | POA: Diagnosis present

## 2021-09-26 DIAGNOSIS — M25551 Pain in right hip: Secondary | ICD-10-CM | POA: Diagnosis present

## 2021-09-26 DIAGNOSIS — Z7982 Long term (current) use of aspirin: Secondary | ICD-10-CM | POA: Diagnosis not present

## 2021-09-26 DIAGNOSIS — Z833 Family history of diabetes mellitus: Secondary | ICD-10-CM | POA: Diagnosis not present

## 2021-09-26 DIAGNOSIS — J9611 Chronic respiratory failure with hypoxia: Secondary | ICD-10-CM | POA: Diagnosis present

## 2021-09-26 DIAGNOSIS — Z801 Family history of malignant neoplasm of trachea, bronchus and lung: Secondary | ICD-10-CM | POA: Diagnosis not present

## 2021-09-26 DIAGNOSIS — Z8249 Family history of ischemic heart disease and other diseases of the circulatory system: Secondary | ICD-10-CM | POA: Diagnosis not present

## 2021-09-26 DIAGNOSIS — Z7984 Long term (current) use of oral hypoglycemic drugs: Secondary | ICD-10-CM | POA: Diagnosis not present

## 2021-09-26 DIAGNOSIS — I10 Essential (primary) hypertension: Secondary | ICD-10-CM | POA: Diagnosis present

## 2021-09-26 DIAGNOSIS — K2971 Gastritis, unspecified, with bleeding: Secondary | ICD-10-CM | POA: Diagnosis present

## 2021-09-26 DIAGNOSIS — Z825 Family history of asthma and other chronic lower respiratory diseases: Secondary | ICD-10-CM | POA: Diagnosis not present

## 2021-09-26 DIAGNOSIS — Z794 Long term (current) use of insulin: Secondary | ICD-10-CM | POA: Diagnosis not present

## 2021-09-26 DIAGNOSIS — F32A Depression, unspecified: Secondary | ICD-10-CM | POA: Diagnosis present

## 2021-09-26 DIAGNOSIS — Z800 Family history of malignant neoplasm of digestive organs: Secondary | ICD-10-CM | POA: Diagnosis not present

## 2021-09-26 DIAGNOSIS — D649 Anemia, unspecified: Secondary | ICD-10-CM | POA: Diagnosis not present

## 2021-09-26 DIAGNOSIS — Z888 Allergy status to other drugs, medicaments and biological substances status: Secondary | ICD-10-CM | POA: Diagnosis not present

## 2021-09-26 DIAGNOSIS — Z20822 Contact with and (suspected) exposure to covid-19: Secondary | ICD-10-CM | POA: Diagnosis present

## 2021-09-26 DIAGNOSIS — K2091 Esophagitis, unspecified with bleeding: Secondary | ICD-10-CM | POA: Diagnosis present

## 2021-09-26 DIAGNOSIS — E785 Hyperlipidemia, unspecified: Secondary | ICD-10-CM | POA: Diagnosis present

## 2021-09-26 LAB — CBC WITH DIFFERENTIAL/PLATELET
Abs Immature Granulocytes: 0.01 10*3/uL (ref 0.00–0.07)
Basophils Absolute: 0 10*3/uL (ref 0.0–0.1)
Basophils Relative: 1 %
Eosinophils Absolute: 0.1 10*3/uL (ref 0.0–0.5)
Eosinophils Relative: 2 %
HCT: 22.3 % — ABNORMAL LOW (ref 39.0–52.0)
Hemoglobin: 6.9 g/dL — ABNORMAL LOW (ref 13.0–17.0)
Immature Granulocytes: 0 %
Lymphocytes Relative: 17 %
Lymphs Abs: 0.7 10*3/uL (ref 0.7–4.0)
MCH: 26.3 pg (ref 26.0–34.0)
MCHC: 30.9 g/dL (ref 30.0–36.0)
MCV: 85.1 fL (ref 80.0–100.0)
Monocytes Absolute: 0.5 10*3/uL (ref 0.1–1.0)
Monocytes Relative: 13 %
Neutro Abs: 2.6 10*3/uL (ref 1.7–7.7)
Neutrophils Relative %: 67 %
Platelets: 150 10*3/uL (ref 150–400)
RBC: 2.62 MIL/uL — ABNORMAL LOW (ref 4.22–5.81)
RDW: 16 % — ABNORMAL HIGH (ref 11.5–15.5)
WBC: 3.9 10*3/uL — ABNORMAL LOW (ref 4.0–10.5)
nRBC: 0 % (ref 0.0–0.2)

## 2021-09-26 LAB — CELIAC DISEASE PANEL
Endomysial Ab, IgA: NEGATIVE
IgA: 498 mg/dL — ABNORMAL HIGH (ref 61–437)
Tissue Transglutaminase Ab, IgA: <2 U/mL (ref 0–3)

## 2021-09-26 LAB — HEMOGLOBIN AND HEMATOCRIT, BLOOD
HCT: 28.1 % — ABNORMAL LOW (ref 39.0–52.0)
Hemoglobin: 9 g/dL — ABNORMAL LOW (ref 13.0–17.0)

## 2021-09-26 LAB — PREPARE RBC (CROSSMATCH)

## 2021-09-26 LAB — GLUCOSE, CAPILLARY
Glucose-Capillary: 183 mg/dL — ABNORMAL HIGH (ref 70–99)
Glucose-Capillary: 189 mg/dL — ABNORMAL HIGH (ref 70–99)
Glucose-Capillary: 172 mg/dL — ABNORMAL HIGH (ref 70–99)
Glucose-Capillary: 119 mg/dL — ABNORMAL HIGH (ref 70–99)

## 2021-09-26 MED ORDER — IPRATROPIUM-ALBUTEROL 0.5-2.5 (3) MG/3ML IN SOLN: 3.0000 mL | RESPIRATORY_TRACT | Status: DC | PRN

## 2021-09-26 MED ORDER — SODIUM CHLORIDE 0.9% IV SOLUTION: Freq: Once | INTRAVENOUS | Status: AC

## 2021-09-26 NOTE — Progress Notes (Addendum)
PROGRESS NOTE    Jeremy [name]  OVZ:[id number] DOB: 02-09-1951 DOA: 09/24/2021 PCP: Juline Patch, MD   Follow-up on symptomatic anemia. Brief Narrative:  Jeremy [name] is a 71 y.o. 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 Patient received 2 units PRBC, IV iron.   Capsule endoscopy is started on 12/1   Assessment & Plan:   Principal Problem:   Symptomatic anemia Active Problems:   Multiple sclerosis (HCC)   Essential hypertension   Depression   DM type 2 with diabetic peripheral neuropathy (HCC)   Chronic kidney disease, stage 3 unspecified (HCC)  Severe iron deficient anemia. B12 level normal.  Patient received 2 units of PRBC at admission, hemoglobin dropped down to 6.9, will give another unit PRBC today.  Patient also status post IV iron, will consider give another dose of IV iron tomorrow. Capsule endoscopy completed, pending results. I will keep patient for another day due to drop of hemoglobin.  Recheck a CBC tomorrow.   Multiple sclerosis. Continue interferon beta injections.  Essential hypertension. Stable.    Patient does not have chronic kidney disease stage III as GFR is more than 60.     DVT prophylaxis: SCDs Code Status: full Family Communication: Wife updated at bedside. Disposition Plan:      Status is: Observation       I/O last 3 completed shifts: In: 1529.3 [I.V.:100; Blood:1358; IV Piggyback:71.3] Out: 200 [Urine:200] Total I/O In: 243 [P.O.:240; I.V.:3] Out: -      Consultants:  GI  Procedures: None  Antimicrobials: None  Subjective: Patient doing well today, denies any short of breath or cough. No abdominal pain or nausea vomiting, no black stool or rectal  bleeding. No hematuria. No fever or chills.   Objective: Vitals:   09/26/21 0409 09/26/21 0902 09/26/21 1017 09/26/21 1033  BP: (!) 101/37 (!) 108/43 (!) 125/48 (!) 115/52  Pulse: 77 90 84 80  Resp: 16 14 18 17   Temp: 98.6 F (37 C) 98.5 F (36.9 C) 98.5 F (36.9 C) 97.9 F (36.6 C)  TempSrc: Oral Oral Oral Oral  SpO2: (!) 85% 93% 90% 92%  Weight:      Height:        Intake/Output Summary (Last 24 hours) at 09/26/2021 1124 Last data filed at 09/26/2021 1015 Gross per 24 hour  Intake 314.3 ml  Output --  Net 314.3 ml   Filed Weights   09/24/21 0935 09/25/21 0218  Weight: 95.1 kg 98.3 kg    Examination:  General exam: Appears calm and comfortable  Respiratory system: Clear to auscultation. Respiratory effort normal. Cardiovascular system: S1 & S2 heard, RRR. No JVD, murmurs, rubs, gallops or clicks. No pedal edema. Gastrointestinal system: Abdomen is nondistended, soft and nontender. No organomegaly or masses felt. Normal bowel sounds heard. Central nervous system: Alert and oriented. No focal neurological deficits. Extremities: Symmetric 5 x 5 power. Skin: No rashes, lesions or ulcers Psychiatry: Judgement and insight appear normal. Mood & affect appropriate.     Data Reviewed: I have personally reviewed following labs and imaging studies  CBC: Recent Labs  Lab 09/24/21 0820 09/25/21 0718 09/26/21 0046  WBC 3.4* 3.6* 3.9*  NEUTROABS 2.3  --  2.6  HGB 5.3* 7.2* 6.9*  HCT 18.1* 22.9* 22.3*  MCV 92.8 84.8 85.1  PLT 156 153 220   Basic Metabolic Panel: Recent Labs  Lab 09/24/21 1252 09/25/21 0718  NA 137 137  K 4.8 4.0  CL 112* 111  CO2 18* 21*  GLUCOSE 102* 93  BUN 37* 33*  CREATININE 1.24 1.24  CALCIUM 9.1 8.7*   GFR: Estimated Creatinine Clearance: 66.1 mL/min (by C-G formula based on SCr of 1.24 mg/dL). Liver Function Tests: Recent Labs  Lab 09/24/21 1252  AST 16  ALT 10  ALKPHOS 52  BILITOT 0.4  PROT 6.6  ALBUMIN 3.2*   No results  for input(s): LIPASE, AMYLASE in the last 168 hours. No results for input(s): AMMONIA in the last 168 hours. Coagulation Profile: Recent Labs  Lab 09/24/21 1252  INR 1.2   Cardiac Enzymes: No results for input(s): CKTOTAL, CKMB, CKMBINDEX, TROPONINI in the last 168 hours. BNP (last 3 results) No results for input(s): PROBNP in the last 8760 hours. HbA1C: No results for input(s): HGBA1C in the last 72 hours. CBG: Recent Labs  Lab 09/25/21 0747 09/25/21 1044 09/25/21 1514 09/25/21 2132 09/26/21 0853  GLUCAP 96 112* 222* 162* 183*   Lipid Profile: No results for input(s): CHOL, HDL, LDLCALC, TRIG, CHOLHDL, LDLDIRECT in the last 72 hours. Thyroid Function Tests: No results for input(s): TSH, T4TOTAL, FREET4, T3FREE, THYROIDAB in the last 72 hours. Anemia Panel: Recent Labs    09/24/21 0820 09/24/21 1252 09/25/21 0718  VITAMINB12  --   --  728  FOLATE  --  9.3  --   FERRITIN 13*  --   --   TIBC 496*  --   --   IRON 9*  --   --    Sepsis Labs: No results for input(s): PROCALCITON, LATICACIDVEN in the last 168 hours.  Recent Results (from the past 240 hour(s))  Resp Panel by RT-PCR (Flu A&B, Covid) Nasopharyngeal Swab     Status: None   Collection Time: 09/24/21 12:52 PM   Specimen: Nasopharyngeal Swab; Nasopharyngeal(NP) swabs in vial transport medium  Result Value Ref Range Status   SARS Coronavirus 2 by RT PCR NEGATIVE NEGATIVE Final    Comment: (NOTE) SARS-CoV-2 target nucleic acids are NOT DETECTED.  The SARS-CoV-2 RNA is generally detectable in upper respiratory specimens during the acute phase of infection. The lowest concentration of SARS-CoV-2 viral copies this assay can detect is 138 copies/mL. A negative result does not preclude SARS-Cov-2 infection and should not be used as the sole basis for treatment or other patient management decisions. A negative result may occur with  improper specimen collection/handling, submission of specimen other than  nasopharyngeal swab, presence of viral mutation(s) within the areas targeted by this assay, and inadequate number of viral copies(<138 copies/mL). A negative result must be combined with clinical observations, patient history, and epidemiological information. The expected result is Negative.  Fact Sheet for Patients:  EntrepreneurPulse.com.au  Fact Sheet for Healthcare Providers:  IncredibleEmployment.be  This test is no t yet approved or cleared by the [place] FDA and  has been authorized for detection and/or diagnosis of SARS-CoV-2 by FDA under an Emergency Use Authorization (EUA). This EUA will remain  in effect (meaning this test can be used) for the duration of the COVID-19 declaration under Section 564(b)(1) of the Act, 21 U.S.C.section 360bbb-3(b)(1), unless the authorization is terminated  or revoked sooner.       Influenza A by PCR NEGATIVE NEGATIVE Final   Influenza B by PCR NEGATIVE NEGATIVE Final    Comment: (  NOTE) The Xpert Xpress SARS-CoV-2/FLU/RSV plus assay is intended as an aid in the diagnosis of influenza from Nasopharyngeal swab specimens and should not be used as a sole basis for treatment. Nasal washings and aspirates are unacceptable for Xpert Xpress SARS-CoV-2/FLU/RSV testing.  Fact Sheet for Patients: EntrepreneurPulse.com.au  Fact Sheet for Healthcare Providers: IncredibleEmployment.be  This test is not yet approved or cleared by the [place] FDA and has been authorized for detection and/or diagnosis of SARS-CoV-2 by FDA under an Emergency Use Authorization (EUA). This EUA will remain in effect (meaning this test can be used) for the duration of the COVID-19 declaration under Section 564(b)(1) of the Act, 21 U.S.C. section 360bbb-3(b)(1), unless the authorization is terminated or revoked.  Performed at [facility], [address], [place], [place]  [address]          Radiology Studies: DG HIP UNILAT WITH PELVIS 2-3 VIEWS RIGHT  Result Date: 09/24/2021 CLINICAL DATA:  Right hip pain after recent fall. EXAM: DG HIP (WITH OR WITHOUT PELVIS) 2-3V RIGHT COMPARISON:  None. FINDINGS: There is no evidence of hip fracture or dislocation. There is no evidence of arthropathy or other focal bone abnormality. IMPRESSION: Negative. Electronically Signed   By: Marijo Conception M.D.   On: 09/24/2021 12:19        Scheduled Meds:  furosemide  20 mg Oral QODAY   gemfibrozil  600 mg Oral BID AC   insulin aspart  0-15 Units Subcutaneous TID WC   insulin glargine-yfgn  16 Units Subcutaneous QHS   Interferon Beta-1b  0.25 mg Subcutaneous QODAY   ipratropium-albuterol  3 mL Inhalation QID   loratadine  10 mg Oral Daily   losartan  25 mg Oral Daily   pregabalin  200 mg Oral BID   sertraline  25 mg Oral Daily   simvastatin  40 mg Oral QPM   sodium bicarbonate  650 mg Oral BID   sodium chloride flush  3 mL Intravenous Q12H   Continuous Infusions:  sodium chloride       LOS: 0 days    Time spent: 27 minutes    Sharen Hones, MD [facility]   To contact the attending provider between 7A-7P or the covering provider during after hours 7P-7A, please log into the web site [email or web address] and access using universal [place] password for that web site. If you do not have the password, please call the hospital operator.  09/26/2021, 11:24 AM

## 2021-09-26 NOTE — Care Plan (Signed)
VCE reviewed. Procedure report to be uploaded during [place]. Non-bleeding avm noted. Small bowel polyp. Area of erythema in colon but prep was poor and was evaluated recently so likely incidental. Patient with leukopenia and borderline thrombocytopenia as well. Multiple myeloma evaluated as outpatient it appears. His MS medicine rarely causes anemia, more likely to be the cause of his leukopenia.  - consider abdominal imaging to evaluate for lymphadenopathy - f/u CBC - consider hemolysis labs as outpatient he had a low haptoglobin  Raylene Miyamoto MD, MPH [place]

## 2021-09-26 NOTE — Plan of Care (Signed)

## 2021-09-26 NOTE — Progress Notes (Signed)
PT Cancellation Note  Patient Details Name: Jeremy [name] MRN: [id number] DOB: 1951-07-20   Cancelled Treatment:    Reason Eval/Treat Not Completed: Patient not medically ready. PT orders received and pt chart reviewed. Pt with Hgb of 6.9 this morning, contraindicating participation with PT. Pending another PRBC. Will follow up with therapy evaluation as appropriate.    Herminio Commons, PT, DPT 8:52 AM,09/26/21

## 2021-09-26 NOTE — Evaluation (Signed)
Physical Therapy Evaluation Patient Details Name: Jeremy [name] MRN: [id number] DOB: [birth year] Today's Date: 09/26/2021  History of Present Illness  Pt admitted to [facility] on 09/24/10 from the cancer center due to low Hgb and needing blood transfusion. Significant PMH includes: depression, chronic pain, DM, HTN, HDL, MS, and chronic anemia. Has had multiple PRBC but still anemic. A colonoscopy was performed which showed patent end-to-end colocolonic anastomosis otherwise no other abnormalities and a capsule study of the small bowel was requested.   Clinical Impression  Pt is a 70 year old M admitted to hospital on 09/24/21 for symptomatic anemia. At baseline, pt is mod I with ADL's and limited community ambulation with rollator/SPC; needs assist for IADL's, medication management, and transportation. Pt evaluated s/p PRBC and was asymptomatic during session. Pt presents with LE weakness, decreased standing balance, decreased activity tolerance, and +1 non-pitting edema bil feet (chronic), resulting in impaired functional mobility. Due to deficits, pt required CGA for safety with transfers and gait. Gait deficits increase the pt's risk of falls, due to decreased knee ext throughout gait and intermittent "buckling" of bil knees during stance, which pt notes is baseline. Pt notes overall mobility is near baseline, with mild deficits in endurance and strength. Deficits limit the pt's ability to safely and independently perform ADL's, transfer, and ambulate. Pt will benefit from acute skilled PT services to address deficits for return to baseline function. At this time, PT recommends OPPT for deconditioning, MS maintenance, and balance. Pt and spouse state that with time, pt will make fully recovery to baseline with use of SPC for gait. PT educated pt and family regarding benefits of OPPT including: reduced fall risk and prevention of MS decline.        Recommendations for follow up therapy are one component of a  multi-disciplinary discharge planning process, led by the attending physician.  Recommendations may be updated based on patient status, additional functional criteria and insurance authorization.  Follow Up Recommendations Outpatient PT    Assistance Recommended at Discharge PRN  Functional Status Assessment Patient has had a recent decline in their functional status and demonstrates the ability to make significant improvements in function in a reasonable and predictable amount of time.   Equipment Recommendations  None recommended by PT       Precautions / Restrictions Precautions Precautions: Fall Restrictions Weight Bearing Restrictions: No      Mobility  Bed Mobility               General bed mobility comments: seated in recliner upon entry    Transfers Overall transfer level: Needs assistance   Transfers: Sit to/from Stand Sit to Stand: Min guard           General transfer comment: with RW    Ambulation/Gait Ambulation/Gait assistance: Min guard Gait Distance (Feet): 100 Feet Assistive device: Rolling walker (2 wheels)   Gait velocity: decreased     General Gait Details: for safety to ambulate with RW. Demonstrates decreased bil knee ext throughout gait cycle, decreased step length/foot clearance bil, decreased RW proximity, and increased UE WB on RW with fatigue. States that his walking is near baseline, with deficits in endurance. Labored breathing with SpO2 at 93%, with RPE of 2-3/10 indicating "light activity"     Balance Overall balance assessment: Needs assistance Sitting-balance support: No upper extremity supported;Feet supported Sitting balance-Leahy Scale: Good Sitting balance - Comments: no LOB in sitting     Standing balance-Leahy Scale: Fair Standing balance comment: in RW, due  to increased bil knee flexion during gait                             Pertinent Vitals/Pain Pain Assessment: No/denies pain    Home Living  Family/patient expects to be discharged to:: Private residence Living Arrangements: Spouse/significant other Available Help at Discharge: Family;Available 24 hours/day (spouse is retired [employer]arry manager) Type of Home: House Home Access: Level entry (at back entrance)       Home Layout: One [place]: [place] (4 wheels);Cane - quad;Cane - single point;Shower seat;Grab bars - tub/shower;Wheelchair - manual;Hand held shower head      Prior Function Prior Level of Function : Independent/Modified Independent;History of Falls (last six months)             Mobility Comments: Pt reports using SPC for household ambulation and rollator for limited community ambulation. If pt is feeling bad, he will use rollator in the home. Multiple falls in past 6 months; no injuries. ADLs Comments: Pt is Ind with ADL's and medication mangement; requires assist from spouse for transportation and IADL's. Pt uses O2 PRN at home (mainly in community setting).        Extremity/Trunk Assessment   Upper Extremity Assessment Upper Extremity Assessment: Overall WFL for tasks assessed (Grossly 4+/5; sensation intact)    Lower Extremity Assessment Lower Extremity Assessment: Overall WFL for tasks assessed (Grossly 4+/5, with hip flexion at 4/5; sensation intact except L5 (chronic neuropathy); coordination intact)    Cervical / Trunk Assessment Cervical / Trunk Assessment: Kyphotic  Communication   Communication: No difficulties  Cognition Arousal/Alertness: Awake/alert Behavior During Therapy: WFL for tasks assessed/performed Overall Cognitive Status: Within Functional Limits for tasks assessed                                 General Comments: A&O x4; able to follow 100% of simple 2-step commands        General Comments General comments (skin integrity, edema, etc.): PRBC prior to PT session; +1 non pitting edema bil feet (Chronic)    Exercises Other Exercises Other Exercises:  Participated in transfers and gait with RW; required CGA for safety. Gait deficits increase pt's risk of falls. Other Exercises: Pt and family educated re: PT role/POC, DC recommendations, energy conservation with RW, safety with mobility, pacing, edema management, ambulating to/from bathroom with nursing. They verbalized understanding.   Assessment/Plan    PT Assessment Patient needs continued PT services  PT Problem List Decreased strength;Decreased activity tolerance;Decreased balance;Decreased mobility;Impaired sensation;Obesity       PT Treatment Interventions Gait training;Functional mobility training;Therapeutic activities;Therapeutic exercise;Balance training;Neuromuscular re-education    PT Goals (Current goals can be found in the Care Plan section)  Acute Rehab PT Goals Patient Stated Goal: "go home" PT Goal Formulation: With patient/family Time For Goal Achievement: 10/10/21 Potential to Achieve Goals: Good    Frequency Min 2X/week    AM-PAC PT "6 Clicks" Mobility  Outcome Measure Help needed turning from your back to your side while in a flat bed without using bedrails?: A Little Help needed moving from lying on your back to sitting on the side of a flat bed without using bedrails?: A Little Help needed moving to and from a bed to a chair (including a wheelchair)?: A Little Help needed standing up from a chair using your arms (e.g., wheelchair or bedside chair)?: A Little Help needed to  walk in hospital room?: A Little Help needed climbing 3-5 steps with a railing? : A Lot 6 Click Score: 17    End of Session Equipment Utilized During Treatment: Gait belt Activity Tolerance: Patient tolerated treatment well Patient left: in chair;with call bell/phone within reach;with family/visitor present Nurse Communication: Mobility status PT Visit Diagnosis: Unsteadiness on feet (R26.81);History of falling (Z91.81);Difficulty in walking, not elsewhere classified (R26.2);Muscle  weakness (generalized) (M62.81)    Time: 7062-3762 PT Time Calculation (min) (ACUTE ONLY): 16 min   Charges:   PT Evaluation $PT Eval Low Complexity: 1 Low          Herminio Commons, PT, DPT 4:06 PM,09/26/21

## 2021-09-27 DIAGNOSIS — E1142 Type 2 diabetes mellitus with diabetic polyneuropathy: Secondary | ICD-10-CM | POA: Diagnosis not present

## 2021-09-27 DIAGNOSIS — G35 Multiple sclerosis: Secondary | ICD-10-CM | POA: Diagnosis not present

## 2021-09-27 DIAGNOSIS — D649 Anemia, unspecified: Secondary | ICD-10-CM | POA: Diagnosis not present

## 2021-09-27 LAB — TYPE AND SCREEN
ABO/RH(D): A POS
Antibody Screen: NEGATIVE
Unit division: 0
Unit division: 0
Unit division: 0

## 2021-09-27 LAB — CBC WITH DIFFERENTIAL/PLATELET
Abs Immature Granulocytes: 0.01 10*3/uL (ref 0.00–0.07)
Basophils Absolute: 0.1 10*3/uL (ref 0.0–0.1)
Basophils Relative: 1 %
Eosinophils Absolute: 0.2 10*3/uL (ref 0.0–0.5)
Eosinophils Relative: 4 %
HCT: 25.8 % — ABNORMAL LOW (ref 39.0–52.0)
Hemoglobin: 8.2 g/dL — ABNORMAL LOW (ref 13.0–17.0)
Immature Granulocytes: 0 %
Lymphocytes Relative: 19 %
Lymphs Abs: 0.8 10*3/uL (ref 0.7–4.0)
MCH: 27.6 pg (ref 26.0–34.0)
MCHC: 31.8 g/dL (ref 30.0–36.0)
MCV: 86.9 fL (ref 80.0–100.0)
Monocytes Absolute: 0.5 10*3/uL (ref 0.1–1.0)
Monocytes Relative: 13 %
Neutro Abs: 2.5 10*3/uL (ref 1.7–7.7)
Neutrophils Relative %: 63 %
Platelets: 131 10*3/uL — ABNORMAL LOW (ref 150–400)
RBC: 2.97 MIL/uL — ABNORMAL LOW (ref 4.22–5.81)
RDW: 15.9 % — ABNORMAL HIGH (ref 11.5–15.5)
WBC: 4.1 10*3/uL (ref 4.0–10.5)
nRBC: 0 % (ref 0.0–0.2)

## 2021-09-27 LAB — LACTATE DEHYDROGENASE: LDH: 121 U/L (ref 98–192)

## 2021-09-27 LAB — BPAM RBC
Blood Product Expiration Date: 202212192359
Blood Product Expiration Date: 202212242359
Blood Product Expiration Date: 202212242359
ISSUE DATE / TIME: 202212012341
ISSUE DATE / TIME: 202212020257
ISSUE DATE / TIME: 202212031008
Unit Type and Rh: 6200
Unit Type and Rh: 6200
Unit Type and Rh: 6200

## 2021-09-27 LAB — GLUCOSE, CAPILLARY
Glucose-Capillary: 207 mg/dL — ABNORMAL HIGH (ref 70–99)
Glucose-Capillary: 147 mg/dL — ABNORMAL HIGH (ref 70–99)

## 2021-09-27 MED ORDER — COMBIVENT RESPIMAT 20-100 MCG/ACT IN AERS: 1.0000 | INHALATION_SPRAY | Freq: Four times a day (QID) | RESPIRATORY_TRACT | Status: DC

## 2021-09-27 MED ORDER — SODIUM CHLORIDE 0.9 % IV SOLN
300.0000 mg | Freq: Once | INTRAVENOUS | Status: AC
  Administered 2021-09-27: 300 mg via INTRAVENOUS
  Filled 2021-09-27 (×2): qty 15

## 2021-09-27 NOTE — Progress Notes (Signed)
Pt was DC after the Iron Infusion was given. AVS was given and explained to pt and wife. All questions has been answered.

## 2021-09-27 NOTE — TOC CM/SW Note (Signed)
Met with patient and spouse at bedside. Explained PT recs for OPPT. Patient declines OPPT, says he plans to do home exercises on his own. Patient and spouse verbalized understanding that if they decide they want OPPT or [place] following DC, patient will need to follow up with his PCP to get this arranged. PCP is Dr. Otilio Miu.   Patient and spouse denied needs prior to DC today.  Oleh Genin, [place]

## 2021-09-27 NOTE — Discharge Summary (Signed)
Physician Discharge Summary  Patient ID: Jeremy [name] MRN: [id number] DOB/AGE: July 14, 1951 70 y.o.  Admit date: 09/24/2021 Discharge date: 09/27/2021  Admission Diagnoses:  Discharge Diagnoses:  Principal Problem:   Symptomatic anemia Active Problems:   Multiple sclerosis ([place])   Essential hypertension   Depression   DM type 2 with diabetic peripheral neuropathy [facility])   Discharged Condition: good  Hospital Course:  Taheem Fricke is a 70 y.o. 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 Patient received 2 units PRBC, IV iron.   Capsule endoscopy is started on 12/1  Severe iron deficient anemia. Mild leukopenia. B12 level normal.  Patient received 3 units of PRBC, also received 300 mg of iron Sucrose.  I will give another 300 mg iron sucrose today.  Hemoglobin has went up again to 8.2.  Patient is medically stable to be discharged. Capsule endoscopy completed, showed small intestine polyp. Patient does not seem to have evidence of hemolytic anemia, his bilirubin level was 0.5 when his hemoglobin was 5.3.  LDH normal.  But he seemed to have history of multiple myeloma, he will be followed by Dr. Tasia Catchings from oncology in 1 week. Will be also followed by Dr. Vicente Males from GI as outpatient.    Multiple sclerosis. Continue interferon beta injections.  Essential hypertension. Stable.  Consults: GI  Significant Diagnostic Studies:   Treatments: IV Iron, PRBC  Discharge Exam: Blood pressure (!) 106/59, pulse 80, temperature 98.2 F (36.8 C), temperature source Oral, resp. rate 14, height 5' 10"  (1.778 m), weight 98.3 kg, SpO2 91 %. General appearance: alert and cooperative Resp: clear to auscultation bilaterally Cardio:  regular rate and rhythm, S1, S2 normal, no murmur, click, rub or gallop GI: soft, non-tender; bowel sounds normal; no masses,  no organomegaly Extremities: extremities normal, atraumatic, no cyanosis or edema  Disposition:  Discharge disposition: 01-Home or Self Care      Discharge Instructions     Diet - low sodium heart healthy   Complete by: As directed    Increase activity slowly   Complete by: As directed       Allergies as of 09/27/2021       Reactions   Betadine [povidone Iodine]         Medication List     STOP taking these medications    cyanocobalamin 1000 MCG/ML injection Commonly known as: (VITAMIN B-12)   ferrous sulfate 325 (65 FE) MG tablet Commonly known as: FeroSul       TAKE these medications    albuterol 108 (90 Base) MCG/ACT inhaler Commonly known as: VENTOLIN HFA Inhale 2 puffs into the lungs in the morning, at noon, in the evening, and at bedtime.   aspirin 81 MG tablet Take 81 mg by mouth daily.   B-D INSULIN SYRINGE 1CC/25GX1" 25G X 1" 1 ML Misc Generic drug: Insulin Syringe-Needle U-100 USE AS DIRECTED   Betaseron 0.3 MG Kit injection Generic drug: Interferon Beta-1b Inject subcutaneously 1 syringe every other day.   Combivent Respimat 20-100 MCG/ACT Aers respimat Generic drug: Ipratropium-Albuterol Inhale 1 puff into the lungs 4 (four) times daily.   furosemide 40 MG tablet Commonly known as: LASIX Take 1 tablet (40 mg total) by mouth every other day. What changed: how much to take   gemfibrozil  600 MG tablet Commonly known as: LOPID TAKE 1 TABLET(600 MG) BY MOUTH TWICE DAILY BEFORE A MEAL   insulin glargine 100 UNIT/ML Solostar Pen Commonly known as: LANTUS Inject 32 Units into the skin at bedtime.   loratadine 10 MG tablet Commonly known as: CLARITIN Take 1 tablet (10 mg total) by mouth daily.   losartan 25 MG tablet Commonly known as: COZAAR Take 25 mg by mouth daily. singh   metFORMIN 500 MG  tablet Commonly known as: GLUCOPHAGE Take 500 mg by mouth 2 (two) times daily.   omeprazole 40 MG capsule Commonly known as: PRILOSEC TAKE 1 CAPSULE BY MOUTH EVERY DAY   pregabalin 200 MG capsule Commonly known as: LYRICA Take 1 capsule (200 mg total) by mouth 2 (two) times daily.   sertraline 50 MG tablet Commonly known as: ZOLOFT Take 1/2 tablet daily   simvastatin 40 MG tablet Commonly known as: ZOCOR Take 1 tablet (40 mg total) by mouth daily. What changed: when to take this   sodium bicarbonate 650 MG tablet Take 650 mg by mouth 2 (two) times daily.   traMADol 50 MG tablet Commonly known as: ULTRAM Take 1 tablet (50 mg total) by mouth every 6 (six) hours as needed.        Follow-up Information     Juline Patch, MD Follow up in 1 week(s).   Specialty: Family Medicine Contact information: [address] [place] [address] [phone]         Earlie Server, MD Follow up in 1 week(s).   Specialty: Oncology Contact information: [place] [address] [phone]         Jonathon Bellows, MD Follow up in 1 week(s).   Specialty: Gastroenterology Contact information: [place] [address] [phone]                32 minutes Signed: Sharen Hones 09/27/2021, 10:49 AM

## 2021-09-28 ENCOUNTER — Telehealth: Payer: Self-pay

## 2021-09-28 ENCOUNTER — Encounter: Payer: Self-pay | Admitting: Gastroenterology

## 2021-09-28 ENCOUNTER — Other Ambulatory Visit: Payer: Self-pay

## 2021-09-28 DIAGNOSIS — D500 Iron deficiency anemia secondary to blood loss (chronic): Secondary | ICD-10-CM

## 2021-09-28 LAB — HEMOGLOBIN A1C
Hgb A1c MFr Bld: 5.1 % (ref 4.8–5.6)
Mean Plasma Glucose: 100 mg/dL

## 2021-09-28 LAB — HAPTOGLOBIN: Haptoglobin: 69 mg/dL (ref 32–363)

## 2021-09-28 NOTE — Telephone Encounter (Signed)
Transition Care Management Follow-up Telephone Call Date of discharge and from where: 09/27/21 [facility] How have you been since you were released from the hospital? Pt states he is doing okay, still weak but feeling better Any questions or concerns? No  Items Reviewed: Did the pt receive and understand the discharge instructions provided? Yes  Medications obtained and verified? Yes  Other? No  Any new allergies since your discharge? No  Dietary orders reviewed? Yes Do you have support at home? Yes   Home Care and Equipment/Supplies: Were home health services ordered? no Were any new equipment or medical supplies ordered?  No  Functional Questionnaire: (I = Independent and D = Dependent) ADLs: I  Bathing/Dressing- I  Meal Prep- I  Eating- I  Maintaining continence- I  Transferring/Ambulation- I  Managing Meds- i  Follow up appointments reviewed:  PCP Hospital f/u appt confirmed? Yes  Scheduled to see Dr. Ronnald Ramp on 10/05/21 @ 3:20. [facility] f/u appt confirmed? Yes  Scheduled to see Dr. Tasia Catchings on 10/15/21. Are transportation arrangements needed? No  If their condition worsens, is the pt aware to call PCP or go to the Emergency Dept.? Yes Was the patient provided with contact information for the PCP's office or ED? Yes Was to pt encouraged to call back with questions or concerns? Yes

## 2021-09-30 ENCOUNTER — Telehealth: Payer: Self-pay | Admitting: *Deleted

## 2021-09-30 NOTE — Telephone Encounter (Signed)
Spoke to pt's wife. She is going to try warm compresses tonight and reassess in the AM. She stated that pt has appointment on Friday with Dr. Tasia Catchings, so he might wait until then to be seen. I informed her if area was no better tomorrow, then please call us back and we would schedule a visit in [facility]. Wife verbalized understanding.

## 2021-09-30 NOTE — Telephone Encounter (Signed)
Wife called reporting that patient IV site where he got blood transfusion in hospital is red and swollen and has a "big knot in it." She is asking for [place] do call her back about it. Please advise

## 2021-10-01 ENCOUNTER — Other Ambulatory Visit: Payer: Self-pay

## 2021-10-01 ENCOUNTER — Telehealth: Payer: Self-pay

## 2021-10-01 NOTE — Telephone Encounter (Signed)
Called pt to follow-up with symptoms reported 12/7. Pt and wife both report that prior IV site has improved after using warm compresses. States that the swelling and redness have both gone down. He did not feel that he needed an appointment today, and stated that he would just wait until his appointment tomorrow to have it checked.

## 2021-10-02 ENCOUNTER — Inpatient Hospital Stay: Payer: Medicare Other

## 2021-10-02 ENCOUNTER — Other Ambulatory Visit: Payer: Self-pay

## 2021-10-02 ENCOUNTER — Inpatient Hospital Stay (HOSPITAL_BASED_OUTPATIENT_CLINIC_OR_DEPARTMENT_OTHER): Payer: Medicare Other | Admitting: Oncology

## 2021-10-02 ENCOUNTER — Encounter: Payer: Self-pay | Admitting: Oncology

## 2021-10-02 VITALS — BP 127/51 | HR 65 | Resp 18

## 2021-10-02 VITALS — BP 110/48 | HR 75 | Temp 97.8°F | Resp 16 | Wt 209.6 lb

## 2021-10-02 DIAGNOSIS — Z87891 Personal history of nicotine dependence: Secondary | ICD-10-CM | POA: Diagnosis not present

## 2021-10-02 DIAGNOSIS — Z7982 Long term (current) use of aspirin: Secondary | ICD-10-CM | POA: Diagnosis not present

## 2021-10-02 DIAGNOSIS — D500 Iron deficiency anemia secondary to blood loss (chronic): Secondary | ICD-10-CM

## 2021-10-02 DIAGNOSIS — N1831 Chronic kidney disease, stage 3a: Secondary | ICD-10-CM

## 2021-10-02 DIAGNOSIS — D631 Anemia in chronic kidney disease: Secondary | ICD-10-CM

## 2021-10-02 DIAGNOSIS — Z794 Long term (current) use of insulin: Secondary | ICD-10-CM | POA: Diagnosis not present

## 2021-10-02 LAB — CBC WITH DIFFERENTIAL/PLATELET
Abs Immature Granulocytes: 0 10*3/uL (ref 0.00–0.07)
Basophils Absolute: 0 10*3/uL (ref 0.0–0.1)
Basophils Relative: 1 %
Eosinophils Absolute: 0.2 10*3/uL (ref 0.0–0.5)
Eosinophils Relative: 4 %
HCT: 30.6 % — ABNORMAL LOW (ref 39.0–52.0)
Hemoglobin: 9.6 g/dL — ABNORMAL LOW (ref 13.0–17.0)
Immature Granulocytes: 0 %
Lymphocytes Relative: 15 %
Lymphs Abs: 0.6 10*3/uL — ABNORMAL LOW (ref 0.7–4.0)
MCH: 28.9 pg (ref 26.0–34.0)
MCHC: 31.4 g/dL (ref 30.0–36.0)
MCV: 92.2 fL (ref 80.0–100.0)
Monocytes Absolute: 0.6 10*3/uL (ref 0.1–1.0)
Monocytes Relative: 15 %
Neutro Abs: 2.6 10*3/uL (ref 1.7–7.7)
Neutrophils Relative %: 65 %
Platelets: 100 10*3/uL — ABNORMAL LOW (ref 150–400)
RBC: 3.32 MIL/uL — ABNORMAL LOW (ref 4.22–5.81)
RDW: 18.6 % — ABNORMAL HIGH (ref 11.5–15.5)
WBC: 4.1 10*3/uL (ref 4.0–10.5)
nRBC: 0 % (ref 0.0–0.2)

## 2021-10-02 LAB — SAMPLE TO BLOOD BANK

## 2021-10-02 MED ORDER — SODIUM CHLORIDE 0.9 % IV SOLN: 200.0000 mg | Freq: Once | INTRAVENOUS | Status: DC

## 2021-10-02 MED ORDER — IRON SUCROSE 20 MG/ML IV SOLN
200.0000 mg | Freq: Once | INTRAVENOUS | Status: AC
  Administered 2021-10-02: 200 mg via INTRAVENOUS
  Filled 2021-10-02: qty 10

## 2021-10-02 MED ORDER — SODIUM CHLORIDE 0.9 % IV SOLN
Freq: Once | INTRAVENOUS | Status: AC
  Filled 2021-10-02: qty 250

## 2021-10-02 NOTE — Progress Notes (Signed)
Patient states he wants to know why his blood keep  getting low.

## 2021-10-02 NOTE — Progress Notes (Signed)
Hematology/Oncology Progress Note    Patient Care Team: Jeremy Patch, MD as PCP - General (Family Medicine) Jeremy Pacas, MD as Consulting Physician (Neurology) Jeremy [name], DPM as Consulting Physician (Podiatry) Jeremy Farber, MD as Consulting Physician (Internal Medicine) Jeremy Sinning, MD as Consulting Physician (Nephrology)  REFERRING PROVIDER: Juline Patch, MD  CHIEF COMPLAINTS/REASON FOR VISIT:  Follow  up for anemia  INTERVAL HISTORY Jeremy [name] is a 70 y.o. male who has above history reviewed by me today presents for follow up visit for anemia 09/24/2021,-09/27/2021, patient had hemoglobin dropped to 5.3 and was advised to go to emergency room be admitted.  Patient received 2 units of PRBC transfusion IV Venofer.    Endoscopy showed gastritis.  Small capsule study showed small bowel polyp.  His hemoglobin improved to 8.2 and patient was discharged. Today patient presents for follow-up. He has called recently due to redness, and swelling at the site of IV.  Patient was recommended to apply warm compress. Today redness and swelling improved. He has been doing satisfactory since discharge.  Fatigue slightly improved.  Denies any melena or bleeding bowel movements.  Review of Systems  Constitutional:  Positive for fatigue. Negative for appetite change, chills and fever.  HENT:   Negative for hearing loss and voice change.   Eyes:  Negative for eye problems and icterus.  Respiratory:  Positive for shortness of breath. Negative for chest tightness and cough.   Cardiovascular:  Negative for chest pain and leg swelling.  Gastrointestinal:  Negative for abdominal distention and abdominal pain.  Endocrine: Negative for hot flashes.  Genitourinary:  Negative for difficulty urinating, dysuria and frequency.   Musculoskeletal:  Negative for arthralgias.  Skin:  Negative for itching and rash.  Neurological:  Negative for light-headedness and numbness.   Hematological:  Negative for adenopathy. Does not bruise/bleed easily.  Psychiatric/Behavioral:  Negative for confusion.    MEDICAL HISTORY:  Past Medical History:  Diagnosis Date   Chronic pain    Depression    Diabetes (HCC)    GERD (gastroesophageal reflux disease)    Hyperlipemia    Hypertension    MS (multiple sclerosis) ([place])     SURGICAL HISTORY: Past Surgical History:  Procedure Laterality Date   COLECTOMY  06-2008   COLONOSCOPY  2015   normal   COLONOSCOPY WITH PROPOFOL N/A 09/10/2021   Procedure: COLONOSCOPY WITH PROPOFOL;  Surgeon: [name], MD;  Location: [facility] ENDOSCOPY;  Service: Endoscopy;  Laterality: N/A;   ESOPHAGOGASTRODUODENOSCOPY (EGD) WITH PROPOFOL N/A 09/10/2021   Procedure: ESOPHAGOGASTRODUODENOSCOPY (EGD) WITH PROPOFOL;  Surgeon: [name], MD;  Location: [facility] ENDOSCOPY;  Service: Endoscopy;  Laterality: N/A;   GIVENS CAPSULE STUDY N/A 09/25/2021   Procedure: GIVENS CAPSULE STUDY;  Surgeon: Jonathon Bellows, MD;  Location: [facility] ENDOSCOPY;  Service: Gastroenterology;  Laterality: N/A;    SOCIAL HISTORY: Social History   Socioeconomic History   Marital status: Married    Spouse name: Jeremy [name]   Number of children: 2   Years of education: GED   Highest education level: Not on file  Occupational History    Comment: Disabled  Tobacco Use   Smoking status: Former    Packs/day: 1.50    Years: 30.00    Pack years: 45.00    Types: Cigarettes    Quit date: 01/23/2006    Years since quitting: 15.7   Smokeless tobacco: Never   Tobacco comments:    N/A  Vaping Use   Vaping Use: Never used  Substance and  Sexual Activity   Alcohol use: Yes    Alcohol/week: 0.0 standard drinks    Comment: rare; maybe 2 beers a year   Drug use: No   Sexual activity: Not Currently  Other Topics Concern   Not on file  Social History Narrative   Patient is disabled.    Patient lives with his wife [name].    Patient has 2 children.       Social Determinants of  Health   Financial Resource Strain: Low Risk    Difficulty of Paying Living Expenses: Not hard at all  Food Insecurity: No Food Insecurity   Worried About Charity fundraiser in the Last Year: Never true   [place] in the Last Year: Never true  Transportation Needs: No Transportation Needs   Lack of Transportation (Medical): No   Lack of Transportation (Non-Medical): No  Physical Activity: Inactive   Days of Exercise per Week: 0 days   Minutes of Exercise per Session: 0 min  Stress: No Stress Concern Present   Feeling of Stress : Not at all  Social Connections: Moderately Isolated   Frequency of Communication with Friends and Family: More than three times a week   Frequency of Social Gatherings with Friends and Family: Once a week   Attends Religious Services: Never   Marine scientist or Organizations: No   Attends Music therapist: Never   Marital Status: Married  Human resources officer Violence: Not At Risk   Fear of Current or Ex-Partner: No   Emotionally Abused: No   Physically Abused: No   Sexually Abused: No    FAMILY HISTORY: Family History  Problem Relation Age of Onset   Lung cancer Mother    Heart attack Father    Heart attack Brother    COPD Brother    Diabetes Brother    Esophageal cancer Nephew     ALLERGIES:  is allergic to betadine [povidone iodine].  MEDICATIONS:  Current Outpatient Medications  Medication Sig Dispense Refill   albuterol (VENTOLIN HFA) 108 (90 Base) MCG/ACT inhaler Inhale 2 puffs into the lungs in the morning, at noon, in the evening, and at bedtime.     aspirin 81 MG tablet Take 81 mg by mouth daily.     B-D INSULIN SYRINGE 1CC/25GX1" 25G X 1" 1 ML MISC USE AS DIRECTED 10 each 2   COMBIVENT RESPIMAT 20-100 MCG/ACT AERS respimat Inhale 1 puff into the lungs 4 (four) times daily.     furosemide (LASIX) 40 MG tablet Take 1 tablet (40 mg total) by mouth every other day. (Patient taking differently: Take 20 mg by mouth  every other day.) 45 tablet 1   gemfibrozil (LOPID) 600 MG tablet TAKE 1 TABLET(600 MG) BY MOUTH TWICE DAILY BEFORE A MEAL 180 tablet 1   Insulin Glargine (LANTUS) 100 UNIT/ML Solostar Pen Inject 32 Units into the skin at bedtime.     Interferon Beta-1b (BETASERON) 0.3 MG KIT injection Inject subcutaneously 1 syringe every other day. 45 kit 4   loratadine (CLARITIN) 10 MG tablet Take 1 tablet (10 mg total) by mouth daily. 90 tablet 1   losartan (COZAAR) 25 MG tablet Take 25 mg by mouth daily. singh     metFORMIN (GLUCOPHAGE) 500 MG tablet Take 500 mg by mouth 2 (two) times daily.     omeprazole (PRILOSEC) 40 MG capsule TAKE 1 CAPSULE BY MOUTH EVERY DAY 90 capsule 1   pregabalin (LYRICA) 200 MG capsule Take  1 capsule (200 mg total) by mouth 2 (two) times daily. 60 capsule 5   sertraline (ZOLOFT) 50 MG tablet Take 1/2 tablet daily 45 tablet 1   simvastatin (ZOCOR) 40 MG tablet Take 1 tablet (40 mg total) by mouth daily. (Patient taking differently: Take 40 mg by mouth at bedtime.) 90 tablet 1   sodium bicarbonate 650 MG tablet Take 650 mg by mouth 2 (two) times daily.     traMADol (ULTRAM) 50 MG tablet Take 1 tablet (50 mg total) by mouth every 6 (six) hours as needed. 120 tablet 5   No current facility-administered medications for this visit.     PHYSICAL EXAMINATION: ECOG PERFORMANCE STATUS: 2 - Symptomatic, <50% confined to bed Vitals:   10/02/21 0850  BP: (!) 110/48  [name]: 75  Resp: 16  Temp: 97.8 F (36.6 C)  SpO2: 95%   Filed Weights   10/02/21 0850  Weight: 209 lb 9.6 oz (95.1 kg)    Physical Exam Constitutional:      General: He is not in acute distress. HENT:     Head: Normocephalic and atraumatic.  Eyes:     General: No scleral icterus. Cardiovascular:     Rate and Rhythm: Normal rate and regular rhythm.     Heart sounds: Normal heart sounds.  Pulmonary:     Effort: Pulmonary effort is normal. No respiratory distress.     Breath sounds: No wheezing.      Comments: Decreased breath sound bilaterally.  He presents comfortably on nasal cannula oxygen. Abdominal:     General: Bowel sounds are normal. There is no distension.     Palpations: Abdomen is soft.  Musculoskeletal:        General: No deformity. Normal range of motion.     Cervical back: Normal range of motion and neck supple.  Skin:    General: Skin is warm and dry.     Findings: No erythema or rash.  Neurological:     Mental Status: He is alert and oriented to person, place, and time. Mental status is at baseline.     Cranial Nerves: No cranial nerve deficit.     Coordination: Coordination normal.  Psychiatric:        Mood and Affect: Mood normal.    LABORATORY DATA:  I have reviewed the data as listed Lab Results  Component Value Date   WBC 4.1 10/02/2021   HGB 9.6 (L) 10/02/2021   HCT 30.6 (L) 10/02/2021   MCV 92.2 10/02/2021   PLT 100 (L) 10/02/2021   Recent Labs    12/25/20 1058 06/01/21 0000 07/23/21 1342 09/24/21 1252 09/25/21 0718  NA 141 136*  --  137 137  K 4.9 5.0  --  4.8 4.0  CL 106 106  --  112* 111  CO2 17* 17  --  18* 21*  GLUCOSE 108*  --   --  102* 93  BUN 32* 39*  --  37* 33*  CREATININE 1.31* 1.3  --  1.24 1.24  CALCIUM 9.1 9.3  --  9.1 8.7*  GFRNONAA  --   --   --  >60 >60  PROT 6.9  --  7.3 6.6  --   ALBUMIN 4.1 3.9 3.4* 3.2*  --   AST 18  --  22 16  --   ALT 10  --  14 10  --   ALKPHOS 68  --  58 52  --   BILITOT <0.2  --  0.7 0.4  --  BILIDIR  --   --  <0.1  --   --   IBILI  --   --  NOT CALCULATED  --   --     Iron/TIBC/Ferritin/ %Sat    Component Value Date/Time   IRON 9 (L) 09/24/2021 0820   IRON 63 (L) 10/10/2013 1130   TIBC 496 (H) 09/24/2021 0820   TIBC 416 10/10/2013 1130   FERRITIN 13 (L) 09/24/2021 0820   FERRITIN 90 10/10/2013 1130   IRONPCTSAT 2 (L) 09/24/2021 0820   IRONPCTSAT 15 10/10/2013 1130       RADIOGRAPHIC STUDIES: I have personally reviewed the radiological images as listed and agreed with the  findings in the report. DG HIP UNILAT WITH PELVIS 2-3 VIEWS RIGHT  Result Date: 09/24/2021 CLINICAL DATA:  Right hip pain after recent fall. EXAM: DG HIP (WITH OR WITHOUT PELVIS) 2-3V RIGHT COMPARISON:  None. FINDINGS: There is no evidence of hip fracture or dislocation. There is no evidence of arthropathy or other focal bone abnormality. IMPRESSION: Negative. Electronically Signed   By: Marijo Conception M.D.   On: 09/24/2021 12:19      ASSESSMENT & PLAN:  1. Iron deficiency anemia due to chronic blood loss   2. Anemia in stage 3a chronic kidney disease (HCC)    #Iron deficiency anemia, in the context of chronic kidney disease. Labs reviewed and discussed with patient.  Hemoglobin has improved to 9.6.  I will hold off additional blood transfusion at this point. Iron deficiency.  Recommend IV Venofer weekly x3.  #Gastritis, continue PPI.  Continue follow-up with gastroenterology. Follow-up  Labs - mid jan (cbc,iron, ferr) / NP & poss venofer 1-2 after labs  Labs in 3 months (cbc,iron,ferr) / MD & poss venofer 1-2 days after labs    Orders Placed This Encounter  Procedures   CBC with Differential/Platelet    Standing Status:   Future    Standing Expiration Date:   10/02/2022   Ferritin    Standing Status:   Future    Standing Expiration Date:   10/02/2022   Iron and TIBC    Standing Status:   Future    Standing Expiration Date:   10/02/2022   CBC with Differential/Platelet    Standing Status:   Future    Standing Expiration Date:   10/02/2022   Ferritin    Standing Status:   Future    Standing Expiration Date:   10/02/2022   Iron and TIBC    Standing Status:   Future    Standing Expiration Date:   10/02/2022    All questions were answered. The patient knows to call the clinic with any problems questions or concerns.  cc Jeremy Patch, MD     Earlie Server, MD, PhD  10/02/2021

## 2021-10-02 NOTE — Patient Instructions (Signed)
[facility] [place]   ?Discharge Instructions: ?Thank you for choosing [facility] to provide your oncology and hematology care.  ?If you have a lab appointment with the Cancer Center, please go directly to the Cancer Center and check in at the registration area. ?  ?We strive to give you quality time with your provider. You may need to reschedule your appointment if you arrive late (15 or more minutes).  Arriving late affects you and other patients whose appointments are after yours.  Also, if you miss three or more appointments without notifying the office, you may be dismissed from the clinic at the provider?s discretion.    ?  ?For prescription refill requests, have your pharmacy contact our office and allow 72 hours for refills to be completed.   ? ?Today you received the following: Venofer.    ?  ?BELOW ARE SYMPTOMS THAT SHOULD BE REPORTED IMMEDIATELY: ?*FEVER GREATER THAN 100.4 F (38 ?C) OR HIGHER ?*CHILLS OR SWEATING ?*NAUSEA AND VOMITING THAT IS NOT CONTROLLED WITH YOUR NAUSEA MEDICATION ?*UNUSUAL SHORTNESS OF BREATH ?*UNUSUAL BRUISING OR BLEEDING ?*URINARY PROBLEMS (pain or burning when urinating, or frequent urination) ?*BOWEL PROBLEMS (unusual diarrhea, constipation, pain near the anus) ?TENDERNESS IN MOUTH AND THROAT WITH OR WITHOUT PRESENCE OF ULCERS (sore throat, sores in mouth, or a toothache) ?UNUSUAL RASH, SWELLING OR PAIN  ?UNUSUAL VAGINAL DISCHARGE OR ITCHING  ? ?Items with * indicate a potential emergency and should be followed up as soon as possible or go to the Emergency Department if any problems should occur. ? ?Should you have questions after your visit or need to cancel or reschedule your appointment, please contact [facility] [place]  Dept: [phone]  and follow the prompts.  Office hours are 8:00 a.m. to 4:30 p.m. Monday - Friday. Please note that voicemails left after 4:00 p.m. may not be returned until the following  business day.  We are closed weekends and major holidays. You have access to a nurse at all times for urgent questions. Please call the main number to the clinic Dept: [phone] and follow the prompts. ? ?For any non-urgent questions, you may also contact your provider using MyChart. We now offer e-Visits for anyone 18 and older to request care online for non-urgent symptoms. For details visit [email or web address]. ?  ?Also download the MyChart app! Go to the app store, search "MyChart", open the app, select [facility], and log in with your MyChart username and password. ? ?Due to Covid, a mask is required upon entering the hospital/clinic. If you do not have a mask, one will be given to you upon arrival. For doctor visits, patients may have 1 support person aged 18 or older with them. For treatment visits, patients cannot have anyone with them due to current Covid guidelines and our immunocompromised population.  ?

## 2021-10-05 ENCOUNTER — Ambulatory Visit (INDEPENDENT_AMBULATORY_CARE_PROVIDER_SITE_OTHER): Payer: Medicare Other | Admitting: Family Medicine

## 2021-10-05 ENCOUNTER — Other Ambulatory Visit: Payer: Self-pay

## 2021-10-05 ENCOUNTER — Encounter: Payer: Self-pay | Admitting: Family Medicine

## 2021-10-05 ENCOUNTER — Encounter: Payer: Self-pay | Admitting: Gastroenterology

## 2021-10-05 ENCOUNTER — Ambulatory Visit (INDEPENDENT_AMBULATORY_CARE_PROVIDER_SITE_OTHER): Payer: Medicare Other | Admitting: Gastroenterology

## 2021-10-05 VITALS — BP 138/74 | HR 78 | Ht 70.0 in | Wt 210.0 lb

## 2021-10-05 VITALS — BP 153/64 | HR 74 | Temp 97.3°F | Ht 70.0 in | Wt 215.0 lb

## 2021-10-05 DIAGNOSIS — D500 Iron deficiency anemia secondary to blood loss (chronic): Secondary | ICD-10-CM

## 2021-10-05 DIAGNOSIS — E1142 Type 2 diabetes mellitus with diabetic polyneuropathy: Secondary | ICD-10-CM

## 2021-10-05 DIAGNOSIS — I10 Essential (primary) hypertension: Secondary | ICD-10-CM

## 2021-10-05 DIAGNOSIS — Z09 Encounter for follow-up examination after completed treatment for conditions other than malignant neoplasm: Secondary | ICD-10-CM

## 2021-10-05 DIAGNOSIS — E1169 Type 2 diabetes mellitus with other specified complication: Secondary | ICD-10-CM | POA: Diagnosis not present

## 2021-10-05 DIAGNOSIS — Z794 Long term (current) use of insulin: Secondary | ICD-10-CM | POA: Diagnosis not present

## 2021-10-05 DIAGNOSIS — F330 Major depressive disorder, recurrent, mild: Secondary | ICD-10-CM | POA: Diagnosis not present

## 2021-10-05 DIAGNOSIS — D649 Anemia, unspecified: Secondary | ICD-10-CM

## 2021-10-05 DIAGNOSIS — G35 Multiple sclerosis: Secondary | ICD-10-CM

## 2021-10-05 DIAGNOSIS — E785 Hyperlipidemia, unspecified: Secondary | ICD-10-CM | POA: Diagnosis not present

## 2021-10-05 NOTE — Progress Notes (Signed)
Primary Care Physician: Juline Patch, MD  Primary Gastroenterologist:  Dr. [name]  Chief Complaint  Patient presents with   Hospitalization Follow-up    IDA     HPI: Jeremy [name] is a 70 y.o. male here for follow-up after being in the hospital with symptomatic anemia.  The patient has had an EGD and colonoscopy in November which did not show any source of the anemia.  The patient was then admitted to the hospital with symptomatic anemia and had a capsule endoscopy that was read as a small bowel polyp and AVMs without any sign of bleeding. The patient had reported consistently brown bowel movements.  The patient continues to state that he does not see any sign of any bleeding.  He only has black stools when he takes the iron.  He denies any abdominal pain and is getting IV iron.  Past Medical History:  Diagnosis Date   Chronic pain    Depression    Diabetes (HCC)    GERD (gastroesophageal reflux disease)    Hyperlipemia    Hypertension    MS (multiple sclerosis) (HCC)     Current Outpatient Medications  Medication Sig Dispense Refill   albuterol (VENTOLIN HFA) 108 (90 Base) MCG/ACT inhaler Inhale 2 puffs into the lungs in the morning, at noon, in the evening, and at bedtime.     aspirin 81 MG tablet Take 81 mg by mouth daily.     B-D INSULIN SYRINGE 1CC/25GX1" 25G X 1" 1 ML MISC USE AS DIRECTED 10 each 2   COMBIVENT RESPIMAT 20-100 MCG/ACT AERS respimat Inhale 1 puff into the lungs 4 (four) times daily.     furosemide (LASIX) 40 MG tablet Take 1 tablet (40 mg total) by mouth every other day. (Patient taking differently: Take 20 mg by mouth every other day.) 45 tablet 1   gemfibrozil (LOPID) 600 MG tablet TAKE 1 TABLET(600 MG) BY MOUTH TWICE DAILY BEFORE A MEAL 180 tablet 1   Insulin Glargine (LANTUS) 100 UNIT/ML Solostar Pen Inject 32 Units into the skin at bedtime.     Interferon Beta-1b (BETASERON) 0.3 MG KIT injection Inject subcutaneously 1 syringe every other  day. 45 kit 4   loratadine (CLARITIN) 10 MG tablet Take 1 tablet (10 mg total) by mouth daily. 90 tablet 1   losartan (COZAAR) 25 MG tablet Take 25 mg by mouth daily. singh     metFORMIN (GLUCOPHAGE) 500 MG tablet Take 500 mg by mouth 2 (two) times daily.     omeprazole (PRILOSEC) 40 MG capsule TAKE 1 CAPSULE BY MOUTH EVERY DAY 90 capsule 1   pregabalin (LYRICA) 200 MG capsule Take 1 capsule (200 mg total) by mouth 2 (two) times daily. 60 capsule 5   sertraline (ZOLOFT) 50 MG tablet Take 1/2 tablet daily 45 tablet 1   simvastatin (ZOCOR) 40 MG tablet Take 1 tablet (40 mg total) by mouth daily. (Patient taking differently: Take 40 mg by mouth at bedtime.) 90 tablet 1   sodium bicarbonate 650 MG tablet Take 650 mg by mouth 2 (two) times daily.     traMADol (ULTRAM) 50 MG tablet Take 1 tablet (50 mg total) by mouth every 6 (six) hours as needed. 120 tablet 5   No current facility-administered medications for this visit.    Allergies as of 10/05/2021 - Review Complete 10/05/2021  Allergen Reaction Noted   Betadine [povidone iodine]  07/21/2013    ROS:  General: Negative for anorexia, weight loss, fever, chills,  fatigue, weakness. ENT: Negative for hoarseness, difficulty swallowing , nasal congestion. CV: Negative for chest pain, angina, palpitations, dyspnea on exertion, peripheral edema.  Respiratory: Negative for dyspnea at rest, dyspnea on exertion, cough, sputum, wheezing.  GI: See history of present illness. GU:  Negative for dysuria, hematuria, urinary incontinence, urinary frequency, nocturnal urination.  Endo: Negative for unusual weight change.    Physical Examination:   BP (!) 153/64 (BP Location: Left Arm, Patient Position: Sitting, Cuff Size: Large)   Pulse 74   Temp (!) 97.3 F (36.3 C) (Temporal)   Ht 5' 10" (1.778 m)   Wt 215 lb (97.5 kg)   BMI 30.85 kg/m   General: Well-nourished, well-developed in no acute distress.  Eyes: No icterus. Conjunctivae  pink. Extremities: No lower extremity edema. No clubbing or deformities. Neuro: Alert and oriented x 3.  Grossly intact. Skin: Warm and dry, no jaundice.   Psych: Alert and cooperative, normal mood and affect.  Labs:    Imaging Studies: DG HIP UNILAT WITH PELVIS 2-3 VIEWS RIGHT  Result Date: 09/24/2021 CLINICAL DATA:  Right hip pain after recent fall. EXAM: DG HIP (WITH OR WITHOUT PELVIS) 2-3V RIGHT COMPARISON:  None. FINDINGS: There is no evidence of hip fracture or dislocation. There is no evidence of arthropathy or other focal bone abnormality. IMPRESSION: Negative. Electronically Signed   By: Marijo Conception M.D.   On: 09/24/2021 12:19    Assessment and Plan:   Jeremy [name] is a 70 y.o. y/o male who comes in today for follow-up after being in the hospital with significant iron deficiency anemia with symptomatic anemia.  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  I discussed the possibility of a double-balloon enteroscopy as a possibility for the AVMs that were seen on the capsule and discussed that they were not actually bleeding at the time of the capsule.  If the anemia continues I have told him that we can entertain sending them to a tertiary care center to get a double-balloon enteroscopy.  I have discussed with Dr. Tasia Catchings about both IV and p.o. iron to possibly increase the patient's iron and hemoglobin.  I would recommend continued iron and transfusions as needed.  The patient will follow up with Dr. Tasia Catchings for iron transfusions.     [name], MD. Mar[facility] Regal    Note: This dictation was prepared with Dragon dictation along with smaller phrase technology. Any transcriptional errors that result from this process are unintentional.

## 2021-10-05 NOTE — Progress Notes (Signed)
Date:  10/05/2021   Name:  Jeremy [name]   DOB:  17-Jan-1951   MRN:  [id number]   Chief Complaint: Hospitalization Follow-up (Anemia- having iron infusions- has next one on 22nd)  Pt was recently admitted to [facility] 09/24/2021 for symptomatic anemia with hemoglobin of 5.3 on 09/24/2021 and was discharged on 09/27/2021. Transition of care call placed on 09/28/2021.Marland Kitchen     Anemia Presents for follow-up visit. There has been no abdominal pain, anorexia, bruising/bleeding easily, confusion, fever, leg swelling, light-headedness, malaise/fatigue, pallor, palpitations, paresthesias or weight loss. Signs of blood loss that are not present include hematemesis, hematochezia and melena. There are no compliance problems.    Lab Results  Component Value Date   NA 137 09/25/2021   K 4.0 09/25/2021   CO2 21 (L) 09/25/2021   GLUCOSE 93 09/25/2021   BUN 33 (H) 09/25/2021   CREATININE 1.24 09/25/2021   CALCIUM 8.7 (L) 09/25/2021   EGFR 59 (L) 12/25/2020   GFRNONAA >60 09/25/2021   Lab Results  Component Value Date   CHOL 103 07/10/2021   HDL 22 (L) 07/10/2021   LDLCALC 35 07/10/2021   TRIG 228 (H) 07/10/2021   CHOLHDL 4.7 07/10/2021   Lab Results  Component Value Date   TSH 1.90 03/30/2021   Lab Results  Component Value Date   HGBA1C 5.1 09/24/2021   Lab Results  Component Value Date   WBC 4.1 10/02/2021   HGB 9.6 (L) 10/02/2021   HCT 30.6 (L) 10/02/2021   MCV 92.2 10/02/2021   PLT 100 (L) 10/02/2021   Lab Results  Component Value Date   ALT 10 09/24/2021   AST 16 09/24/2021   ALKPHOS 52 09/24/2021   BILITOT 0.4 09/24/2021   No results found for: 25OHVITD2, 25OHVITD3, VD25OH   Review of Systems  Constitutional:  Negative for chills, fever, malaise/fatigue and weight loss.  HENT:  Negative for drooling, ear discharge, ear pain and sore throat.   Respiratory:  Negative for cough, shortness of breath and wheezing.   Cardiovascular:  Negative for chest pain,  palpitations and leg swelling.  Gastrointestinal:  Negative for abdominal pain, anorexia, blood in stool, constipation, diarrhea, hematemesis, hematochezia, melena and nausea.  Endocrine: Negative for polydipsia.  Genitourinary:  Negative for dysuria, frequency, hematuria and urgency.  Musculoskeletal:  Negative for back pain, myalgias and neck pain.  Skin:  Negative for pallor and rash.  Allergic/Immunologic: Negative for environmental allergies.  Neurological:  Negative for dizziness, light-headedness, headaches and paresthesias.  Hematological:  Does not bruise/bleed easily.  Psychiatric/Behavioral:  Negative for confusion and suicidal ideas. The patient is not nervous/anxious.    Patient Active Problem List   Diagnosis Date Noted   Symptomatic anemia 09/24/2021   Gastric polyp    Gastritis without bleeding    Anemia in stage 3a chronic kidney disease ([place]) 06/19/2021   Iron deficiency anemia due to chronic blood loss 06/19/2021   Gait abnormality 05/07/2021   Chronic kidney disease, stage 3 unspecified ([place]) 05/20/2020   DM type 2 with diabetic peripheral neuropathy ([place]) 05/06/2020   Idiopathic peripheral neuropathy 01/15/2020   Generalized edema 01/15/2020   Primary pulmonary hypertension ([place]) 10/28/2019   Therapeutic drug monitoring 03/23/2018   Mild episode of recurrent major depressive disorder ([place]) 09/26/2017   Ventricular ectopic beats 09/26/2017   Type 2 diabetes mellitus with diabetic polyneuropathy, with long-term current use of insulin ([place]) 08/12/2017   Hyperlipidemia due to type 2 diabetes mellitus ([place]) 08/12/2017   B12 deficiency 12/14/2016  Low back pain 03/10/2016   Lumbosacral disc disease 01/01/2016   Neuropathy associated with endocrine disorder ([place]) 11/19/2015   B12 deficiency anemia 06/03/2015   Essential hypertension 06/03/2015   Hyperlipidemia 06/03/2015   Depression 06/03/2015   Gastroesophageal reflux disease without esophagitis 06/03/2015    Edema extremities 06/03/2015   Multiple sclerosis ([place]) 07/26/2013   Abnormality of gait 07/26/2013   Morbid obesity ([place]) 07/26/2013    Allergies  Allergen Reactions   Betadine [Povidone Iodine]     Past Surgical History:  Procedure Laterality Date   COLECTOMY  06-2008   COLONOSCOPY  2015   normal   COLONOSCOPY WITH PROPOFOL N/A 09/10/2021   Procedure: COLONOSCOPY WITH PROPOFOL;  Surgeon: [name], MD;  Location: [facility] ENDOSCOPY;  Service: Endoscopy;  Laterality: N/A;   ESOPHAGOGASTRODUODENOSCOPY (EGD) WITH PROPOFOL N/A 09/10/2021   Procedure: ESOPHAGOGASTRODUODENOSCOPY (EGD) WITH PROPOFOL;  Surgeon: [name], MD;  Location: [facility] ENDOSCOPY;  Service: Endoscopy;  Laterality: N/A;   GIVENS CAPSULE STUDY N/A 09/25/2021   Procedure: GIVENS CAPSULE STUDY;  Surgeon: Jonathon Bellows, MD;  Location: [facility] ENDOSCOPY;  Service: Gastroenterology;  Laterality: N/A;    Social History   Tobacco Use   Smoking status: Former    Packs/day: 1.50    Years: 30.00    Pack years: 45.00    Types: Cigarettes    Quit date: 01/23/2006    Years since quitting: 15.7   Smokeless tobacco: Never   Tobacco comments:    N/A  Vaping Use   Vaping Use: Never used  Substance Use Topics   Alcohol use: Yes    Alcohol/week: 0.0 standard drinks    Comment: rare; maybe 2 beers a year   Drug use: No     Medication list has been reviewed and updated.  Current Meds  Medication Sig   albuterol (VENTOLIN HFA) 108 (90 Base) MCG/ACT inhaler Inhale 2 puffs into the lungs in the morning, at noon, in the evening, and at bedtime.   aspirin 81 MG tablet Take 81 mg by mouth daily.   B-D INSULIN SYRINGE 1CC/25GX1" 25G X 1" 1 ML MISC USE AS DIRECTED   COMBIVENT RESPIMAT 20-100 MCG/ACT AERS respimat Inhale 1 puff into the lungs 4 (four) times daily.   furosemide (LASIX) 40 MG tablet Take 1 tablet (40 mg total) by mouth every other day. (Patient taking differently: Take 20 mg by mouth every other day.)   gemfibrozil  (LOPID) 600 MG tablet TAKE 1 TABLET(600 MG) BY MOUTH TWICE DAILY BEFORE A MEAL   Insulin Glargine (LANTUS) 100 UNIT/ML Solostar Pen Inject 32 Units into the skin at bedtime.   Interferon Beta-1b (BETASERON) 0.3 MG KIT injection Inject subcutaneously 1 syringe every other day.   loratadine (CLARITIN) 10 MG tablet Take 1 tablet (10 mg total) by mouth daily.   losartan (COZAAR) 25 MG tablet Take 25 mg by mouth daily. singh   metFORMIN (GLUCOPHAGE) 500 MG tablet Take 500 mg by mouth 2 (two) times daily.   omeprazole (PRILOSEC) 40 MG capsule TAKE 1 CAPSULE BY MOUTH EVERY DAY   pregabalin (LYRICA) 200 MG capsule Take 1 capsule (200 mg total) by mouth 2 (two) times daily.   sertraline (ZOLOFT) 50 MG tablet Take 1/2 tablet daily   simvastatin (ZOCOR) 40 MG tablet Take 1 tablet (40 mg total) by mouth daily. (Patient taking differently: Take 40 mg by mouth at bedtime.)   sodium bicarbonate 650 MG tablet Take 650 mg by mouth 2 (two) times daily.   traMADol (ULTRAM) 50  MG tablet Take 1 tablet (50 mg total) by mouth every 6 (six) hours as needed.    PHQ 2/9 Scores 10/05/2021 06/30/2021 12/25/2020 11/05/2020  PHQ - 2 Score 0 0 0 0  PHQ- 9 Score 0 0 0 -    GAD 7 : Generalized Anxiety Score 06/30/2021 09/11/2020 07/07/2020 01/15/2020  Nervous, Anxious, on Edge 0 0 0 0  Control/stop worrying 0 0 0 0  Worry too much - different things 0 0 0 0  Trouble relaxing 0 0 0 0  Restless 0 0 0 0  Easily annoyed or irritable 0 0 0 0  Afraid - awful might happen 0 0 0 0  Total GAD 7 Score 0 0 0 0    BP Readings from Last 3 Encounters:  10/05/21 (!) 142/74  10/05/21 (!) 153/64  10/02/21 (!) 127/51    Physical Exam Vitals and nursing note reviewed.  HENT:     Head: Normocephalic.     Right Ear: Tympanic membrane and external ear normal.     Left Ear: Tympanic membrane and external ear normal.     Nose: Nose normal.  Eyes:     General: No scleral icterus.       Right eye: No discharge.        Left eye: No  discharge.     Conjunctiva/sclera: Conjunctivae normal.     Pupils: Pupils are equal, round, and reactive to light.  Neck:     Thyroid: No thyromegaly.     Vascular: No JVD.     Trachea: No tracheal deviation.  Cardiovascular:     Rate and Rhythm: Normal rate and regular rhythm.     Heart sounds: Normal heart sounds. No murmur heard.   No friction rub. No gallop.  Pulmonary:     Effort: No respiratory distress.     Breath sounds: Normal breath sounds. No decreased breath sounds, wheezing, rhonchi or rales.  Abdominal:     General: Bowel sounds are normal.     Palpations: Abdomen is soft. There is no mass.     Tenderness: There is no abdominal tenderness. There is no guarding or rebound.  Musculoskeletal:        General: No tenderness. Normal range of motion.     Cervical back: Normal range of motion and neck supple.  Lymphadenopathy:     Cervical: No cervical adenopathy.  Skin:    General: Skin is warm.     Findings: No rash.  Neurological:     Mental Status: He is alert and oriented to person, place, and time.     Cranial Nerves: No cranial nerve deficit.     Deep Tendon Reflexes: Reflexes are normal and symmetric.    Wt Readings from Last 3 Encounters:  10/05/21 210 lb (95.3 kg)  10/05/21 215 lb (97.5 kg)  10/02/21 209 lb 9.6 oz (95.1 kg)    BP (!) 142/74   Pulse 78   Ht 5' 10"  (1.778 m)   Wt 210 lb (95.3 kg)   BMI 30.13 kg/m   Assessment and Plan:  1. Hospital discharge follow-up Patient's status post discharge from hospital for symptomatic anemia with an admission hemoglobin of 5.3 and after 3 units of packed cells was discharged over 88.  Evaluations were done in the hospital for GI concern for etiology of anemia  2. Symptomatic anemia Acute on chronic was followed by Dr. Tasia Catchings oncology.  Patient also is followed by Dr. Floyde Parkins for possible GI concern including upper and  lower endoscopies/colonoscopies as well as small bowel capsule endoscopy.  At this point  time we will continue Protonix and iron infusions.  3. Iron deficiency anemia due to chronic blood loss Patient is followed by hematology Dr. Tasia Catchings and is undergoing iron infusion.  4. Multiple sclerosis (HCC) Chronic.  Controlled.  Stable.  Continue interferon beta injections every other day.  5. Essential hypertension Chronic.  Controlled.  Stable.  Continue Cozaar 25 mg once a day.  6. 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  Controlled.  Stable.  Continue Lantus at current dosing.  As well as metformin 500 mg twice a day.  7. 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  Controlled.  Stable.  Continue sertraline 25 mg once a day. 8.  Hyper lipidemia continue simvastatin 40 mg as well as gemfibrozil 600 mg twice a day and omega-3.  Once a day.  9.  Diabetic neuropathy currently controlled with  pregabapentin 200 mg 1 capsule twice a day

## 2021-10-07 ENCOUNTER — Ambulatory Visit
Admission: RE | Admit: 2021-10-07 | Discharge: 2021-10-07 | Disposition: A | Discharge status: Home / Self Care | Payer: Medicare Other | Source: Ambulatory Visit | Attending: Specialist | Admitting: Specialist

## 2021-10-07 ENCOUNTER — Other Ambulatory Visit: Payer: Self-pay

## 2021-10-07 DIAGNOSIS — R918 Other nonspecific abnormal finding of lung field: Secondary | ICD-10-CM | POA: Diagnosis not present

## 2021-10-07 DIAGNOSIS — R0609 Other forms of dyspnea: Secondary | ICD-10-CM | POA: Insufficient documentation

## 2021-10-07 DIAGNOSIS — I700 Atherosclerosis of aorta: Secondary | ICD-10-CM | POA: Diagnosis not present

## 2021-10-07 DIAGNOSIS — J439 Emphysema, unspecified: Secondary | ICD-10-CM | POA: Diagnosis not present

## 2021-10-07 DIAGNOSIS — R911 Solitary pulmonary nodule: Secondary | ICD-10-CM | POA: Diagnosis not present

## 2021-10-15 ENCOUNTER — Other Ambulatory Visit: Payer: Medicare Other

## 2021-10-15 ENCOUNTER — Inpatient Hospital Stay: Payer: Medicare Other

## 2021-10-15 ENCOUNTER — Ambulatory Visit: Payer: Medicare Other | Admitting: Oncology

## 2021-10-15 ENCOUNTER — Ambulatory Visit: Payer: Medicare Other

## 2021-10-15 ENCOUNTER — Other Ambulatory Visit: Payer: Self-pay

## 2021-10-15 VITALS — BP 158/66 | HR 72 | Temp 97.9°F | Resp 20

## 2021-10-15 DIAGNOSIS — N1831 Chronic kidney disease, stage 3a: Secondary | ICD-10-CM | POA: Diagnosis not present

## 2021-10-15 DIAGNOSIS — D500 Iron deficiency anemia secondary to blood loss (chronic): Secondary | ICD-10-CM | POA: Diagnosis not present

## 2021-10-15 DIAGNOSIS — D631 Anemia in chronic kidney disease: Secondary | ICD-10-CM | POA: Diagnosis not present

## 2021-10-15 DIAGNOSIS — Z794 Long term (current) use of insulin: Secondary | ICD-10-CM | POA: Diagnosis not present

## 2021-10-15 DIAGNOSIS — Z87891 Personal history of nicotine dependence: Secondary | ICD-10-CM | POA: Diagnosis not present

## 2021-10-15 DIAGNOSIS — Z7982 Long term (current) use of aspirin: Secondary | ICD-10-CM | POA: Diagnosis not present

## 2021-10-15 MED ORDER — SODIUM CHLORIDE 0.9 % IV SOLN: 200.0000 mg | Freq: Once | INTRAVENOUS | Status: DC

## 2021-10-15 MED ORDER — IRON SUCROSE 20 MG/ML IV SOLN
200.0000 mg | Freq: Once | INTRAVENOUS | Status: AC
  Administered 2021-10-15: 13:00:00 200 mg via INTRAVENOUS
  Filled 2021-10-15: qty 10

## 2021-10-15 MED ORDER — SODIUM CHLORIDE 0.9 % IV SOLN
INTRAVENOUS | Status: DC
  Filled 2021-10-15: qty 250

## 2021-10-15 NOTE — Patient Instructions (Signed)

## 2021-10-22 ENCOUNTER — Other Ambulatory Visit: Payer: Self-pay

## 2021-10-22 ENCOUNTER — Inpatient Hospital Stay: Payer: Medicare Other

## 2021-10-22 VITALS — BP 127/58 | HR 78 | Temp 96.2°F | Resp 20

## 2021-10-22 DIAGNOSIS — D500 Iron deficiency anemia secondary to blood loss (chronic): Secondary | ICD-10-CM | POA: Diagnosis not present

## 2021-10-22 DIAGNOSIS — N1831 Chronic kidney disease, stage 3a: Secondary | ICD-10-CM | POA: Diagnosis not present

## 2021-10-22 DIAGNOSIS — Z7982 Long term (current) use of aspirin: Secondary | ICD-10-CM | POA: Diagnosis not present

## 2021-10-22 DIAGNOSIS — Z794 Long term (current) use of insulin: Secondary | ICD-10-CM | POA: Diagnosis not present

## 2021-10-22 DIAGNOSIS — Z87891 Personal history of nicotine dependence: Secondary | ICD-10-CM | POA: Diagnosis not present

## 2021-10-22 DIAGNOSIS — D631 Anemia in chronic kidney disease: Secondary | ICD-10-CM | POA: Diagnosis not present

## 2021-10-22 MED ORDER — SODIUM CHLORIDE 0.9 % IV SOLN: 200.0000 mg | Freq: Once | INTRAVENOUS | Status: DC

## 2021-10-22 MED ORDER — SODIUM CHLORIDE 0.9 % IV SOLN
INTRAVENOUS | Status: DC
  Filled 2021-10-22: qty 250

## 2021-10-22 MED ORDER — IRON SUCROSE 20 MG/ML IV SOLN
200.0000 mg | Freq: Once | INTRAVENOUS | Status: AC
  Administered 2021-10-22: 14:00:00 200 mg via INTRAVENOUS
  Filled 2021-10-22: qty 10

## 2021-11-05 ENCOUNTER — Other Ambulatory Visit: Payer: Self-pay | Admitting: *Deleted

## 2021-11-05 DIAGNOSIS — D500 Iron deficiency anemia secondary to blood loss (chronic): Secondary | ICD-10-CM

## 2021-11-10 ENCOUNTER — Other Ambulatory Visit: Payer: Self-pay

## 2021-11-10 ENCOUNTER — Inpatient Hospital Stay: Payer: Medicare Other | Attending: Nurse Practitioner

## 2021-11-10 DIAGNOSIS — D631 Anemia in chronic kidney disease: Secondary | ICD-10-CM | POA: Diagnosis not present

## 2021-11-10 DIAGNOSIS — K2970 Gastritis, unspecified, without bleeding: Secondary | ICD-10-CM | POA: Diagnosis not present

## 2021-11-10 DIAGNOSIS — D509 Iron deficiency anemia, unspecified: Secondary | ICD-10-CM | POA: Insufficient documentation

## 2021-11-10 DIAGNOSIS — Z794 Long term (current) use of insulin: Secondary | ICD-10-CM | POA: Diagnosis not present

## 2021-11-10 DIAGNOSIS — D500 Iron deficiency anemia secondary to blood loss (chronic): Secondary | ICD-10-CM

## 2021-11-10 DIAGNOSIS — Z7982 Long term (current) use of aspirin: Secondary | ICD-10-CM | POA: Diagnosis not present

## 2021-11-10 DIAGNOSIS — Z79899 Other long term (current) drug therapy: Secondary | ICD-10-CM | POA: Insufficient documentation

## 2021-11-10 DIAGNOSIS — N189 Chronic kidney disease, unspecified: Secondary | ICD-10-CM | POA: Insufficient documentation

## 2021-11-10 DIAGNOSIS — Z7984 Long term (current) use of oral hypoglycemic drugs: Secondary | ICD-10-CM | POA: Insufficient documentation

## 2021-11-10 LAB — CBC WITH DIFFERENTIAL/PLATELET
Abs Immature Granulocytes: 0.01 10*3/uL (ref 0.00–0.07)
Basophils Absolute: 0 10*3/uL (ref 0.0–0.1)
Basophils Relative: 1 %
Eosinophils Absolute: 0.2 10*3/uL (ref 0.0–0.5)
Eosinophils Relative: 6 %
HCT: 31.3 % — ABNORMAL LOW (ref 39.0–52.0)
Hemoglobin: 10.3 g/dL — ABNORMAL LOW (ref 13.0–17.0)
Immature Granulocytes: 0 %
Lymphocytes Relative: 18 %
Lymphs Abs: 0.6 10*3/uL — ABNORMAL LOW (ref 0.7–4.0)
MCH: 31.4 pg (ref 26.0–34.0)
MCHC: 32.9 g/dL (ref 30.0–36.0)
MCV: 95.4 fL (ref 80.0–100.0)
Monocytes Absolute: 0.4 10*3/uL (ref 0.1–1.0)
Monocytes Relative: 14 %
Neutro Abs: 1.9 10*3/uL (ref 1.7–7.7)
Neutrophils Relative %: 61 %
Platelets: 172 10*3/uL (ref 150–400)
RBC: 3.28 MIL/uL — ABNORMAL LOW (ref 4.22–5.81)
RDW: 19.9 % — ABNORMAL HIGH (ref 11.5–15.5)
WBC: 3.1 10*3/uL — ABNORMAL LOW (ref 4.0–10.5)
nRBC: 0 % (ref 0.0–0.2)

## 2021-11-10 LAB — IRON AND TIBC
Iron: 91 ug/dL (ref 45–182)
Saturation Ratios: 24 % (ref 17.9–39.5)
TIBC: 378 ug/dL (ref 250–450)
UIBC: 287 ug/dL

## 2021-11-10 LAB — FERRITIN: Ferritin: 174 ng/mL (ref 24–336)

## 2021-11-11 ENCOUNTER — Ambulatory Visit: Payer: Medicare Other

## 2021-11-12 ENCOUNTER — Inpatient Hospital Stay: Payer: Medicare Other

## 2021-11-12 ENCOUNTER — Other Ambulatory Visit: Payer: Self-pay

## 2021-11-12 ENCOUNTER — Encounter: Payer: Self-pay | Admitting: Nurse Practitioner

## 2021-11-12 ENCOUNTER — Inpatient Hospital Stay (HOSPITAL_BASED_OUTPATIENT_CLINIC_OR_DEPARTMENT_OTHER): Payer: Medicare Other | Admitting: Nurse Practitioner

## 2021-11-12 VITALS — BP 131/58 | HR 73 | Temp 96.6°F | Resp 17 | Wt 210.0 lb

## 2021-11-12 DIAGNOSIS — D500 Iron deficiency anemia secondary to blood loss (chronic): Secondary | ICD-10-CM | POA: Diagnosis not present

## 2021-11-12 DIAGNOSIS — Z7982 Long term (current) use of aspirin: Secondary | ICD-10-CM | POA: Diagnosis not present

## 2021-11-12 DIAGNOSIS — N1831 Chronic kidney disease, stage 3a: Secondary | ICD-10-CM

## 2021-11-12 DIAGNOSIS — D631 Anemia in chronic kidney disease: Secondary | ICD-10-CM

## 2021-11-12 DIAGNOSIS — K2970 Gastritis, unspecified, without bleeding: Secondary | ICD-10-CM | POA: Diagnosis not present

## 2021-11-12 DIAGNOSIS — N189 Chronic kidney disease, unspecified: Secondary | ICD-10-CM | POA: Diagnosis not present

## 2021-11-12 DIAGNOSIS — D509 Iron deficiency anemia, unspecified: Secondary | ICD-10-CM | POA: Diagnosis not present

## 2021-11-12 DIAGNOSIS — Z7984 Long term (current) use of oral hypoglycemic drugs: Secondary | ICD-10-CM | POA: Diagnosis not present

## 2021-11-12 NOTE — Progress Notes (Signed)
Hematology/Oncology Progress Note  Patient Care Team: Juline Patch, MD as PCP - General (Family Medicine) Marcial Pacas, MD as Consulting Physician (Neurology) Samara Deist, DPM as Consulting Physician (Podiatry) Lonia Farber, MD as Consulting Physician (Internal Medicine) Magnus Sinning, MD as Consulting Physician (Nephrology)  REFERRING PROVIDER: Juline Patch, MD   CHIEF COMPLAINTS/REASON FOR VISIT:  Follow  up for anemia  History of Presenting Illness: 09/24/2021,-09/27/2021, patient had hemoglobin dropped to 5.3 and was advised to go to emergency room be admitted.  Patient received 2 units of PRBC transfusion IV Venofer.    Endoscopy showed gastritis.  Small capsule study showed small bowel polyp.  His hemoglobin improved to 8.2 and patient was discharged.  INTERVAL HISTORY Jeremy [name] is a 71 y.o. male with above history of returns to clinic for follow up for anemia. He feels better since receiving IV iron and blood. Slowly getting stronger. Denies any black or bloody stools. No complaints today. He's been followed by Dr. Rolena Infante.    Review of Systems  Constitutional:  Positive for fatigue. Negative for appetite change, chills and fever.  HENT:   Negative for hearing loss and voice change.   Eyes:  Negative for eye problems and icterus.  Respiratory:  Negative for chest tightness, cough and shortness of breath.   Cardiovascular:  Negative for chest pain and leg swelling.  Gastrointestinal:  Negative for abdominal distention and abdominal pain.  Endocrine: Negative for hot flashes.  Genitourinary:  Negative for difficulty urinating, dysuria and frequency.   Musculoskeletal:  Negative for arthralgias.  Skin:  Negative for itching and rash.  Neurological:  Negative for light-headedness and numbness.  Hematological:  Negative for adenopathy. Does not bruise/bleed easily.  Psychiatric/Behavioral:  Negative for confusion.    MEDICAL HISTORY:  Past Medical  History:  Diagnosis Date   Chronic pain    Depression    Diabetes (HCC)    GERD (gastroesophageal reflux disease)    Hyperlipemia    Hypertension    MS (multiple sclerosis) ([place])     SURGICAL HISTORY: Past Surgical History:  Procedure Laterality Date   COLECTOMY  06-2008   COLONOSCOPY  2015   normal   COLONOSCOPY WITH PROPOFOL N/A 09/10/2021   Procedure: COLONOSCOPY WITH PROPOFOL;  Surgeon: [name], MD;  Location: [facility] ENDOSCOPY;  Service: Endoscopy;  Laterality: N/A;   ESOPHAGOGASTRODUODENOSCOPY (EGD) WITH PROPOFOL N/A 09/10/2021   Procedure: ESOPHAGOGASTRODUODENOSCOPY (EGD) WITH PROPOFOL;  Surgeon: [name], MD;  Location: [facility] ENDOSCOPY;  Service: Endoscopy;  Laterality: N/A;   GIVENS CAPSULE STUDY N/A 09/25/2021   Procedure: GIVENS CAPSULE STUDY;  Surgeon: Jonathon Bellows, MD;  Location: [facility] ENDOSCOPY;  Service: Gastroenterology;  Laterality: N/A;    SOCIAL HISTORY: Social History   Socioeconomic History   Marital status: Married    Spouse name: [name]   Number of children: 2   Years of education: GED   Highest education level: Not on file  Occupational History    Comment: Disabled  Tobacco Use   Smoking status: Former    Packs/day: 1.50    Years: 30.00    Pack years: 45.00    Types: Cigarettes    Quit date: 01/23/2006    Years since quitting: 15.8   Smokeless tobacco: Never   Tobacco comments:    N/A  Vaping Use   Vaping Use: Never used  Substance and Sexual Activity   Alcohol use: Yes    Alcohol/week: 0.0 standard drinks    Comment: rare; maybe 2 beers a  year   Drug use: No   Sexual activity: Not Currently  Other Topics Concern   Not on file  Social History Narrative   Patient is disabled.    Patient lives with his wife Jeremy [name].    Patient has 2 children.       Social Determinants of Health   Financial Resource Strain: Not on file  Food Insecurity: Not on file  Transportation Needs: Not on file  Physical Activity: Not on file  Stress:  Not on file  Social Connections: Not on file  Intimate Partner Violence: Not on file    FAMILY HISTORY: Family History  Problem Relation Age of Onset   Lung cancer Mother    Heart attack Father    Heart attack Brother    COPD Brother    Diabetes Brother    Esophageal cancer Nephew     ALLERGIES:  is allergic to betadine [povidone iodine].  MEDICATIONS:  Current Outpatient Medications  Medication Sig Dispense Refill   albuterol (VENTOLIN HFA) 108 (90 Base) MCG/ACT inhaler Inhale 2 puffs into the lungs in the morning, at noon, in the evening, and at bedtime.     aspirin 81 MG tablet Take 81 mg by mouth daily.     B-D INSULIN SYRINGE 1CC/25GX1" 25G X 1" 1 ML MISC USE AS DIRECTED 10 each 2   COMBIVENT RESPIMAT 20-100 MCG/ACT AERS respimat Inhale 1 puff into the lungs 4 (four) times daily.     ferrous sulfate 325 (65 FE) MG tablet Take 325 mg by mouth daily with breakfast.     furosemide (LASIX) 40 MG tablet Take 1 tablet (40 mg total) by mouth every other day. (Patient taking differently: Take 20 mg by mouth every other day.) 45 tablet 1   gemfibrozil (LOPID) 600 MG tablet TAKE 1 TABLET(600 MG) BY MOUTH TWICE DAILY BEFORE A MEAL 180 tablet 1   Insulin Glargine (LANTUS) 100 UNIT/ML Solostar Pen Inject 32 Units into the skin at bedtime.     Interferon Beta-1b (BETASERON) 0.3 MG KIT injection Inject subcutaneously 1 syringe every other day. 45 kit 4   loratadine (CLARITIN) 10 MG tablet Take 1 tablet (10 mg total) by mouth daily. 90 tablet 1   losartan (COZAAR) 25 MG tablet Take 25 mg by mouth daily. singh     metFORMIN (GLUCOPHAGE) 500 MG tablet Take 500 mg by mouth 2 (two) times daily.     omeprazole (PRILOSEC) 40 MG capsule TAKE 1 CAPSULE BY MOUTH EVERY DAY 90 capsule 1   pregabalin (LYRICA) 200 MG capsule Take 1 capsule (200 mg total) by mouth 2 (two) times daily. 60 capsule 5   sertraline (ZOLOFT) 50 MG tablet Take 1/2 tablet daily 45 tablet 1   simvastatin (ZOCOR) 40 MG tablet Take  1 tablet (40 mg total) by mouth daily. (Patient taking differently: Take 40 mg by mouth at bedtime.) 90 tablet 1   sodium bicarbonate 650 MG tablet Take 650 mg by mouth 2 (two) times daily.     traMADol (ULTRAM) 50 MG tablet Take 1 tablet (50 mg total) by mouth every 6 (six) hours as needed. 120 tablet 5   No current facility-administered medications for this visit.     PHYSICAL EXAMINATION: ECOG PERFORMANCE STATUS: 2 - Symptomatic, <50% confined to bed Vitals:   11/12/21 1401  BP: (!) 131/58  Pulse: 73  Resp: 17  Temp: (!) 96.6 F (35.9 C)  SpO2: 93%   Filed Weights   11/12/21 1401  Weight:  210 lb (95.3 kg)    Physical Exam Vitals reviewed.  Constitutional:      Appearance: He is not ill-appearing.     Interventions: Nasal cannula in place.  Pulmonary:     Effort: No respiratory distress.  Abdominal:     General: There is no distension.     Tenderness: There is no abdominal tenderness.  Skin:    Coloration: Skin is not pale.  Neurological:     Mental Status: He is alert and oriented to person, place, and time.  Psychiatric:        Mood and Affect: Mood normal.        Behavior: Behavior normal.    LABORATORY DATA:  I have reviewed the data as listed Lab Results  Component Value Date   WBC 3.1 (L) 11/10/2021   HGB 10.3 (L) 11/10/2021   HCT 31.3 (L) 11/10/2021   MCV 95.4 11/10/2021   PLT 172 11/10/2021   Recent Labs    12/25/20 1058 06/01/21 0000 07/23/21 1342 09/24/21 1252 09/25/21 0718  NA 141 136*  --  137 137  K 4.9 5.0  --  4.8 4.0  CL 106 106  --  112* 111  CO2 17* 17  --  18* 21*  GLUCOSE 108*  --   --  102* 93  BUN 32* 39*  --  37* 33*  CREATININE 1.31* 1.3  --  1.24 1.24  CALCIUM 9.1 9.3  --  9.1 8.7*  GFRNONAA  --   --   --  >60 >60  PROT 6.9  --  7.3 6.6  --   ALBUMIN 4.1 3.9 3.4* 3.2*  --   AST 18  --  22 16  --   ALT 10  --  14 10  --   ALKPHOS 68  --  58 52  --   BILITOT <0.2  --  0.7 0.4  --   BILIDIR  --   --  <0.1  --   --    IBILI  --   --  NOT CALCULATED  --   --     Iron/TIBC/Ferritin/ %Sat    Component Value Date/Time   IRON 91 11/10/2021 0901   IRON 63 (L) 10/10/2013 1130   TIBC 378 11/10/2021 0901   TIBC 416 10/10/2013 1130   FERRITIN 174 11/10/2021 0901   FERRITIN 90 10/10/2013 1130   IRONPCTSAT 24 11/10/2021 0901   IRONPCTSAT 15 10/10/2013 1130       RADIOGRAPHIC STUDIES: I have personally reviewed the radiological images as listed and agreed with the findings in the report. No results found.    ASSESSMENT & PLAN:  No diagnosis found.  #Iron deficiency anemia, in the context of chronic kidney disease.- s/p evaluation with Dr. Delman Goshorn Norris 10/05/21. No evidence of bleeding at that time but if anemia continues, possibility of double balloon enteroscopy at tertiary care center. Labs reviewed and discussed with patient.  Hemoglobin has improved to 10.3.  He is s/p venofer x 3. Ferritin now 174. Iron sat 24% and normal TIBC. Hold additional IV iron at this time. Discussed that if he remains stable we can consider stretching out time between appointments.   #Gastritis, continue PPI.  Continue follow-up with gastroenterology.  RTC in 1 month for labs (cbc, ferritin, iron) and follow up with Dr. Tasia Catchings. If he re-bleeds he will need to see GI sooner.   No orders of the defined types were placed in this encounter.   All questions were  answered. The patient knows to call the clinic with any problems questions or concerns.  cc Juline Patch, MD   Beckey Rutter, [place], AGNP-C [place] [facility] [phone] (clinic) 11/12/2021

## 2021-11-12 NOTE — Progress Notes (Signed)
Patient here for oncology follow-up appointment, expresses no new concerns at this time.    

## 2021-11-13 ENCOUNTER — Encounter: Payer: Self-pay | Admitting: Oncology

## 2021-11-16 ENCOUNTER — Ambulatory Visit (INDEPENDENT_AMBULATORY_CARE_PROVIDER_SITE_OTHER): Payer: Medicare Other

## 2021-11-16 DIAGNOSIS — Z0000 Encounter for general adult medical examination without abnormal findings: Secondary | ICD-10-CM | POA: Diagnosis not present

## 2021-11-16 NOTE — Patient Instructions (Signed)
Jeremy [name] , Thank you for taking time to come for your Medicare Wellness Visit. I appreciate your ongoing commitment to your health goals. Please review the following plan we discussed and let me know if I can assist you in the future.   Screening recommendations/referrals: Colonoscopy: done 09/10/21. Repeat as directed Recommended yearly ophthalmology/optometry visit for glaucoma screening and checkup Recommended yearly dental visit for hygiene and checkup  Vaccinations: Influenza vaccine: done 06/30/21 Pneumococcal vaccine: done 01/09/19 Tdap vaccine: done 07/06/18 Shingles vaccine: Shingrix discussed. Please contact your pharmacy for coverage information.  Covid-19:  done 12/02/19, 01/02/20 & 10/27/20  Advanced directives: Please bring a copy of your health care power of attorney and living will to the office at your convenience once you have completed those documents.   Conditions/risks identified: Recommend fall prevention in the home  Next appointment: Follow up in one year for your annual wellness visit.   Preventive Care 70 Years and Older, Male Preventive care refers to lifestyle choices and visits with your health care provider that can promote health and wellness. What does preventive care include? A yearly physical exam. This is also called an annual well check. Dental exams once or twice a year. Routine eye exams. Ask your health care provider how often you should have your eyes checked. Personal lifestyle choices, including: Daily care of your teeth and gums. Regular physical activity. Eating a healthy diet. Avoiding tobacco and drug use. Limiting alcohol use. Practicing safe sex. Taking low doses of aspirin every day. Taking vitamin and mineral supplements as recommended by your health care provider. What happens during an annual well check? The services and screenings done by your health care provider during your annual well check will depend on your age, overall health,  lifestyle risk factors, and family history of disease. Counseling  Your health care provider may ask you questions about your: Alcohol use. Tobacco use. Drug use. Emotional well-being. Home and relationship well-being. Sexual activity. Eating habits. History of falls. Memory and ability to understand (cognition). Work and work Statistician. Screening  You may have the following tests or measurements: Height, weight, and BMI. Blood pressure. Lipid and cholesterol levels. These may be checked every 5 years, or more frequently if you are over 62 years old. Skin check. Lung cancer screening. You may have this screening every year starting at age 65 if you have a 30-pack-year history of smoking and currently smoke or have quit within the past 15 years. Fecal occult blood test (FOBT) of the stool. You may have this test every year starting at age 86. Flexible sigmoidoscopy or colonoscopy. You may have a sigmoidoscopy every 5 years or a colonoscopy every 10 years starting at age 19. Prostate cancer screening. Recommendations will vary depending on your family history and other risks. Hepatitis C blood test. Hepatitis B blood test. Sexually transmitted disease (STD) testing. Diabetes screening. This is done by checking your blood sugar (glucose) after you have not eaten for a while (fasting). You may have this done every 1-3 years. Abdominal aortic aneurysm (AAA) screening. You may need this if you are a current or former smoker. Osteoporosis. You may be screened starting at age 25 if you are at high risk. Talk with your health care provider about your test results, treatment options, and if necessary, the need for more tests. Vaccines  Your health care provider may recommend certain vaccines, such as: Influenza vaccine. This is recommended every year. Tetanus, diphtheria, and acellular pertussis (Tdap, Td) vaccine. You may need  a Td booster every 10 years. Zoster vaccine. You may need this  after age 65. Pneumococcal 13-valent conjugate (PCV13) vaccine. One dose is recommended after age 6. Pneumococcal polysaccharide (PPSV23) vaccine. One dose is recommended after age 56. Talk to your health care provider about which screenings and vaccines you need and how often you need them. This information is not intended to replace advice given to you by your health care provider. Make sure you discuss any questions you have with your health care provider. Document Released: 11/07/2015 Document Revised: 06/30/2016 Document Reviewed: 08/12/2015 Elsevier Interactive Patient Education  2017 [employer] Prevention in the Home Falls can cause injuries. They can happen to people of all ages. There are many things you can do to make your home safe and to help prevent falls. What can I do on the outside of my home? Regularly fix the edges of walkways and driveways and fix any cracks. Remove anything that might make you trip as you walk through a door, such as a raised step or threshold. Trim any bushes or trees on the path to your home. Use bright outdoor lighting. Clear any walking paths of anything that might make someone trip, such as rocks or tools. Regularly check to see if handrails are loose or broken. Make sure that both sides of any steps have handrails. Any raised decks and porches should have guardrails on the edges. Have any leaves, snow, or ice cleared regularly. Use sand or salt on walking paths during winter. Clean up any spills in your garage right away. This includes oil or grease spills. What can I do in the bathroom? Use night lights. Install grab bars by the toilet and in the tub and shower. Do not use towel bars as grab bars. Use non-skid mats or decals in the tub or shower. If you need to sit down in the shower, use a plastic, non-slip stool. Keep the floor dry. Clean up any water that spills on the floor as soon as it happens. Remove soap buildup in the tub or  shower regularly. Attach bath mats securely with double-sided non-slip rug tape. Do not have throw rugs and other things on the floor that can make you trip. What can I do in the bedroom? Use night lights. Make sure that you have a light by your bed that is easy to reach. Do not use any sheets or blankets that are too big for your bed. They should not hang down onto the floor. Have a firm chair that has side arms. You can use this for support while you get dressed. Do not have throw rugs and other things on the floor that can make you trip. What can I do in the kitchen? Clean up any spills right away. Avoid walking on wet floors. Keep items that you use a lot in easy-to-reach places. If you need to reach something above you, use a strong step stool that has a grab bar. Keep electrical cords out of the way. Do not use floor polish or wax that makes floors slippery. If you must use wax, use non-skid floor wax. Do not have throw rugs and other things on the floor that can make you trip. What can I do with my stairs? Do not leave any items on the stairs. Make sure that there are handrails on both sides of the stairs and use them. Fix handrails that are broken or loose. Make sure that handrails are as long as the stairways. Check  any carpeting to make sure that it is firmly attached to the stairs. Fix any carpet that is loose or worn. Avoid having throw rugs at the top or bottom of the stairs. If you do have throw rugs, attach them to the floor with carpet tape. Make sure that you have a light switch at the top of the stairs and the bottom of the stairs. If you do not have them, ask someone to add them for you. What else can I do to help prevent falls? Wear shoes that: Do not have high heels. Have rubber bottoms. Are comfortable and fit you well. Are closed at the toe. Do not wear sandals. If you use a stepladder: Make sure that it is fully opened. Do not climb a closed stepladder. Make  sure that both sides of the stepladder are locked into place. Ask someone to hold it for you, if possible. Clearly mark and make sure that you can see: Any grab bars or handrails. First and last steps. Where the edge of each step is. Use tools that help you move around (mobility aids) if they are needed. These include: Canes. Walkers. Scooters. Crutches. Turn on the lights when you go into a dark area. Replace any light bulbs as soon as they burn out. Set up your furniture so you have a clear path. Avoid moving your furniture around. If any of your floors are uneven, fix them. If there are any pets around you, be aware of where they are. Review your medicines with your doctor. Some medicines can make you feel dizzy. This can increase your chance of falling. Ask your doctor what other things that you can do to help prevent falls. This information is not intended to replace advice given to you by your health care provider. Make sure you discuss any questions you have with your health care provider. Document Released: 08/07/2009 Document Revised: 03/18/2016 Document Reviewed: 11/15/2014 Elsevier Interactive Patient Education  2017 [employer].

## 2021-11-16 NOTE — Progress Notes (Signed)
Subjective:   Jeremy [name] is a 71 y.o. male who presents for Medicare Annual/Subsequent preventive examination.  Virtual Visit via Telephone Note  I connected with  Jeremy [name] on 11/16/21 at 10:00 AM EST by telephone and verified that I am speaking with the correct person using two identifiers.  Location: Patient: home Provider: [facility] Persons participating in the virtual visit: [place]   I discussed the limitations, risks, security and privacy concerns of performing an evaluation and management service by telephone and the availability of in person appointments. The patient expressed understanding and agreed to proceed.  Interactive audio and video telecommunications were attempted between this nurse and patient, however failed, due to patient having technical difficulties OR patient did not have access to video capability.  We continued and completed visit with audio only.  Some vital signs may be absent or patient reported.   Jeremy Marker, LPN   Review of Systems     Cardiac Risk Factors include: advanced age (>53mn, >>64women);diabetes mellitus;dyslipidemia;male gender;hypertension;obesity (BMI >30kg/m2)     Objective:    There were no vitals filed for this visit. There is no height or weight on file to calculate BMI.  Advanced Directives 11/16/2021 10/02/2021 09/25/2021 09/24/2021 09/15/2021 09/10/2021 07/14/2021  Does Patient Have a Medical Advance Directive? No No No No No No No  Type of Advance Directive - - - - - - -  Copy of Healthcare Power of Attorney in Chart? - - - - - - -  Would patient like information on creating a medical advance directive? No - Patient declined No - Patient declined No - Patient declined - No - Patient declined - -    Current Medications (verified) Outpatient Encounter Medications as of 11/16/2021  Medication Sig   albuterol (VENTOLIN HFA) 108 (90 Base) MCG/ACT inhaler Inhale 2 puffs into the lungs in the morning, at  noon, in the evening, and at bedtime.   aspirin 81 MG tablet Take 81 mg by mouth daily.   B-D INSULIN SYRINGE 1CC/25GX1" 25G X 1" 1 ML MISC USE AS DIRECTED   ferrous sulfate 325 (65 FE) MG tablet Take 325 mg by mouth daily with breakfast.   furosemide (LASIX) 40 MG tablet Take 1 tablet (40 mg total) by mouth every other day. (Patient taking differently: Take 20 mg by mouth every other day.)   gemfibrozil (LOPID) 600 MG tablet TAKE 1 TABLET(600 MG) BY MOUTH TWICE DAILY BEFORE A MEAL   Insulin Glargine (LANTUS) 100 UNIT/ML Solostar Pen Inject 32 Units into the skin at bedtime.   Interferon Beta-1b (BETASERON) 0.3 MG KIT injection Inject subcutaneously 1 syringe every other day.   loratadine (CLARITIN) 10 MG tablet Take 1 tablet (10 mg total) by mouth daily.   losartan (COZAAR) 25 MG tablet Take 25 mg by mouth daily. singh   metFORMIN (GLUCOPHAGE) 500 MG tablet Take 500 mg by mouth 2 (two) times daily.   omeprazole (PRILOSEC) 40 MG capsule TAKE 1 CAPSULE BY MOUTH EVERY DAY   pregabalin (LYRICA) 200 MG capsule Take 1 capsule (200 mg total) by mouth 2 (two) times daily.   sertraline (ZOLOFT) 50 MG tablet Take 1/2 tablet daily   simvastatin (ZOCOR) 40 MG tablet Take 1 tablet (40 mg total) by mouth daily. (Patient taking differently: Take 40 mg by mouth at bedtime.)   sodium bicarbonate 650 MG tablet Take 650 mg by mouth 2 (two) times daily.   traMADol (ULTRAM) 50 MG tablet Take 1 tablet (50 mg total)  by mouth every 6 (six) hours as needed.   [DISCONTINUED] COMBIVENT RESPIMAT 20-100 MCG/ACT AERS respimat Inhale 1 puff into the lungs 4 (four) times daily.   No facility-administered encounter medications on file as of 11/16/2021.    Allergies (verified) Betadine [povidone iodine]   History: Past Medical History:  Diagnosis Date   Chronic pain    Depression    Diabetes (HCC)    GERD (gastroesophageal reflux disease)    Hyperlipemia    Hypertension    MS (multiple sclerosis) ([place])    Past  Surgical History:  Procedure Laterality Date   COLECTOMY  06-2008   COLONOSCOPY  2015   normal   COLONOSCOPY WITH PROPOFOL N/A 09/10/2021   Procedure: COLONOSCOPY WITH PROPOFOL;  Surgeon: [name], MD;  Location: [facility] ENDOSCOPY;  Service: Endoscopy;  Laterality: N/A;   ESOPHAGOGASTRODUODENOSCOPY (EGD) WITH PROPOFOL N/A 09/10/2021   Procedure: ESOPHAGOGASTRODUODENOSCOPY (EGD) WITH PROPOFOL;  Surgeon: [name], MD;  Location: [facility] ENDOSCOPY;  Service: Endoscopy;  Laterality: N/A;   GIVENS CAPSULE STUDY N/A 09/25/2021   Procedure: GIVENS CAPSULE STUDY;  Surgeon: Jonathon Bellows, MD;  Location: [facility] ENDOSCOPY;  Service: Gastroenterology;  Laterality: N/A;   Family History  Problem Relation Age of Onset   Lung cancer Mother    Heart attack Father    Heart attack Brother    COPD Brother    Diabetes Brother    Esophageal cancer Nephew    Social History   Socioeconomic History   Marital status: Married    Spouse name: Jeremy [name]   Number of children: 2   Years of education: GED   Highest education level: Not on file  Occupational History    Comment: Disabled  Tobacco Use   Smoking status: Former    Packs/day: 1.50    Years: 30.00    Pack years: 45.00    Types: Cigarettes    Quit date: 01/23/2006    Years since quitting: 15.8   Smokeless tobacco: Never   Tobacco comments:    N/A  Vaping Use   Vaping Use: Never used  Substance and Sexual Activity   Alcohol use: Not Currently    Comment: rare; maybe 2 beers a year   Drug use: No   Sexual activity: Not Currently  Other Topics Concern   Not on file  Social History Narrative   Patient is disabled.    Patient lives with his wife Jeremy [name].    Patient has 2 children.       Social Determinants of Health   Financial Resource Strain: Low Risk    Difficulty of Paying Living Expenses: Not hard at all  Food Insecurity: No Food Insecurity   Worried About Charity fundraiser in the Last Year: Never true   [place] in the  Last Year: Never true  Transportation Needs: No Transportation Needs   Lack of Transportation (Medical): No   Lack of Transportation (Non-Medical): No  Physical Activity: Sufficiently Active   Days of Exercise per Week: 7 days   Minutes of Exercise per Session: 30 min  Stress: No Stress Concern Present   Feeling of Stress : Not at all  Social Connections: Moderately Isolated   Frequency of Communication with Friends and Family: More than three times a week   Frequency of Social Gatherings with Friends and Family: Once a week   Attends Religious Services: Never   Marine scientist or Organizations: No   Attends Archivist Meetings: Never  Marital Status: Married    Tobacco Counseling Counseling given: Not Answered Tobacco comments: N/A   Clinical Intake:  Pre-visit preparation completed: Yes  Pain : No/denies pain     Nutritional Risks: None Diabetes: Yes CBG done?: No Did pt. bring in CBG monitor from home?: No  How often do you need to have someone help you when you read instructions, pamphlets, or other written materials from your doctor or pharmacy?: 1 - Never  Nutrition Risk Assessment:  Has the patient had any N/V/D within the last 2 months?  No  Does the patient have any non-healing wounds?  No  Has the patient had any unintentional weight loss or weight gain?  No   Diabetes:  Is the patient diabetic?  Yes  If diabetic, was a CBG obtained today?  No  Did the patient bring in their glucometer from home?  No  How often do you monitor your CBG's? daily.   Financial Strains and Diabetes Management:  Are you having any financial strains with the device, your supplies or your medication? No .  Does the patient want to be seen by Chronic Care Management for management of their diabetes?  No  Would the patient like to be referred to a Nutritionist or for Diabetic Management?  No   Diabetic Exams:  Diabetic Eye Exam: Completed 03/10/21 negative  retinopathy.   Diabetic Foot Exam: Completed 09/22/20. Pt has been advised about the importance in completing this exam. Pt is scheduled for diabetic foot exam on 12/28/21.    Interpreter Needed?: No  Information entered by :: Jeremy Marker LPN   Activities of Daily Living In your present state of health, do you have any difficulty performing the following activities: 11/16/2021 09/25/2021  Hearing? N N  Vision? N N  Difficulty concentrating or making decisions? N N  Walking or climbing stairs? Y Y  Dressing or bathing? N Y  Doing errands, shopping? N Y  Conservation officer, nature and eating ? N -  Using the Toilet? N -  In the past six months, have you accidently leaked urine? N -  Do you have problems with loss of bowel control? N -  Managing your Medications? N -  Managing your Finances? N -  Housekeeping or managing your Housekeeping? N -  Some recent data might be hidden    Patient Care Team: Juline Patch, MD as PCP - General (Family Medicine) Marcial Pacas, MD as Consulting Physician (Neurology) Samara Deist, DPM as Consulting Physician (Podiatry) Lonia Farber, MD as Consulting Physician (Internal Medicine) Magnus Sinning, MD as Consulting Physician (Nephrology)  Indicate any recent Medical Services you may have received from other than [facility] providers in the past year (date may be approximate).     Assessment:   This is a routine wellness examination for Garnie.  Hearing/Vision screen Hearing Screening - Comments:: Pt denies hearing difficulty Vision Screening - Comments:: Annual eye exams done at [place] issues and exercise activities discussed: Current Exercise Habits: Home exercise routine, Type of exercise: Other - see comments (recumbent bike), Time (Minutes): 30, Frequency (Times/Week): 7, Weekly Exercise (Minutes/Week): 210, Intensity: Mild, Exercise limited by: orthopedic condition(s)   Goals Addressed   None    Depression  Screen PHQ 2/9 Scores 11/16/2021 10/05/2021 06/30/2021 12/25/2020 11/05/2020 09/11/2020 07/07/2020  PHQ - 2 Score 0 0 0 0 0 0 0  PHQ- 9 Score - 0 0 0 - 0 2    Fall Risk Fall Risk  11/16/2021 06/30/2021 11/05/2020 09/11/2020 07/07/2020  Falls in the past year? 1 1 1 1 1   Number falls in past yr: 1 1 1 1 1   Injury with Fall? 0 0 0 0 0  Comment - - - - -  Risk Factor Category  - - - - -  Comment - - - - -  Risk for fall due to : History of fall(s);Impaired balance/gait Impaired balance/gait;History of fall(s) History of fall(s);Impaired balance/gait;Impaired mobility - -  Risk for fall due to: Comment - - - - -  Follow up Falls prevention discussed Falls evaluation completed Falls prevention discussed Falls evaluation completed Falls evaluation completed  Comment - - - - -    FALL RISK PREVENTION PERTAINING TO THE HOME:  Any stairs in or around the home? No  If so, are there any without handrails? No  Home free of loose throw rugs in walkways, pet beds, electrical cords, etc? Yes  Adequate lighting in your home to reduce risk of falls? Yes   ASSISTIVE DEVICES UTILIZED TO PREVENT FALLS:  Life alert? No  Use of a cane, walker or w/c? Yes  Grab bars in the bathroom? Yes  Shower chair or bench in shower? Yes  Elevated toilet seat or a handicapped toilet? Yes   TIMED UP AND GO:  Was the test performed? No . Telephonic visit   Cognitive Function: Normal cognitive status assessed by direct observation by this Nurse Health Advisor. No abnormalities found.       6CIT Screen 11/05/2020 11/05/2019 10/04/2018 09/29/2017  What Year? 0 points 0 points 0 points 0 points  What month? 0 points 0 points 0 points 0 points  What time? 0 points 0 points 0 points 0 points  Count back from 20 0 points 0 points 0 points 0 points  Months in reverse 0 points 0 points 0 points 0 points  Repeat phrase 0 points 2 points 2 points 2 points  Total Score 0 2 2 2     Immunizations Immunization History  Administered  Date(s) Administered   Fluad Quad(high Dose 65+) 07/16/2019, 07/07/2020, 06/30/2021   Influenza, High Dose Seasonal PF 08/09/2017, 07/06/2018   Influenza,inj,Quad PF,6+ Mos 07/28/2015, 08/06/2016   Influenza-Unspecified 07/25/2014   Moderna Sars-Covid-2 Vaccination 12/02/2019, 01/02/2020, 10/27/2020   Pneumococcal Conjugate-13 09/29/2017   Pneumococcal Polysaccharide-23 01/09/2019   Tdap 07/06/2018    TDAP status: Up to date  Flu Vaccine status: Up to date  Pneumococcal vaccine status: Up to date  Covid-19 vaccine status: Completed vaccines  Qualifies for Shingles Vaccine? Yes   Zostavax completed No   Shingrix Completed?: No.    Education has been provided regarding the importance of this vaccine. Patient has been advised to call insurance company to determine out of pocket expense if they have not yet received this vaccine. Advised may also receive vaccine at local pharmacy or Health Dept. Verbalized acceptance and understanding.  Screening Tests Health Maintenance  Topic Date Due   FOOT EXAM  09/22/2021   Zoster Vaccines- Shingrix (1 of 2) 01/03/2022 (Originally 05/04/2002)   OPHTHALMOLOGY EXAM  03/10/2022   HEMOGLOBIN A1C  03/25/2022   TETANUS/TDAP  07/06/2028   COLONOSCOPY (Pts 45-66yr Insurance coverage will need to be confirmed)  09/11/2031   Pneumonia Vaccine 70 Years old  Completed   INFLUENZA VACCINE  Completed   Hepatitis C Screening  Completed   HPV VACCINES  Aged Out   COVID-19 Vaccine  Discontinued    Health Maintenance  Health Maintenance Due  Topic  Date Due   FOOT EXAM  09/22/2021    Colorectal cancer screening: Type of screening: Colonoscopy. Completed 09/10/21. Repeat every as directed years  Lung Cancer Screening: (Low Dose CT Chest recommended if Age 43-80 years, 30 pack-year currently smoking OR have quit w/in 15years.) does not qualify.  Additional Screening:  Hepatitis C Screening: does qualify; Completed 09/29/17  Vision Screening:  Recommended annual ophthalmology exams for early detection of glaucoma and other disorders of the eye. Is the patient up to date with their annual eye exam?  Yes  Who is the provider or what is the name of the office in which the patient attends annual eye exams? [facility].   Dental Screening: Recommended annual dental exams for proper oral hygiene  Community Resource Referral / Chronic Care Management: CRR required this visit?  No   CCM required this visit?  No      Plan:     I have personally reviewed and noted the following in the patients chart:   Medical and social history Use of alcohol, tobacco or illicit drugs  Current medications and supplements including opioid prescriptions. Patient is currently taking opioid prescriptions. Information provided to patient regarding non-opioid alternatives. Patient advised to discuss non-opioid treatment plan with their provider. Functional ability and status Nutritional status Physical activity Advanced directives List of other physicians Hospitalizations, surgeries, and ER visits in previous 12 months Vitals Screenings to include cognitive, depression, and falls Referrals and appointments  In addition, I have reviewed and discussed with patient certain preventive protocols, quality metrics, and best practice recommendations. A written personalized care plan for preventive services as well as general preventive health recommendations were provided to patient.   Due to this being a telephonic visit, the after visit summary with patients personalized plan was offered to patient via my-chart.   Jeremy Marker, LPN   01/05/9701   Nurse Notes: none

## 2021-11-17 ENCOUNTER — Other Ambulatory Visit: Payer: Self-pay | Admitting: Family Medicine

## 2021-11-17 DIAGNOSIS — E782 Mixed hyperlipidemia: Secondary | ICD-10-CM

## 2021-11-17 NOTE — Telephone Encounter (Signed)
Requested Prescriptions  Pending Prescriptions Disp Refills   simvastatin (ZOCOR) 40 MG tablet [Pharmacy Med Name: Simvastatin 40 MG Oral Tablet] 90 tablet 1    Sig: TAKE 1 TABLET BY MOUTH DAILY     Cardiovascular:  Antilipid - Statins Failed - 11/17/2021  4:57 PM      Failed - HDL in normal range and within 360 days    HDL  Date Value Ref Range Status  07/10/2021 22 (L) >40 mg/dL Final  12/25/2020 26 (L) >39 mg/dL Final         Failed - Triglycerides in normal range and within 360 days    Triglycerides  Date Value Ref Range Status  07/10/2021 228 (H) <150 mg/dL Final         Passed - Total Cholesterol in normal range and within 360 days    Cholesterol, Total  Date Value Ref Range Status  12/25/2020 109 100 - 199 mg/dL Final   Cholesterol  Date Value Ref Range Status  07/10/2021 103 0 - 200 mg/dL Final         Passed - LDL in normal range and within 360 days    LDL Chol Calc (NIH)  Date Value Ref Range Status  12/25/2020 50 0 - 99 mg/dL Final   LDL Cholesterol  Date Value Ref Range Status  07/10/2021 35 0 - 99 mg/dL Final    Comment:           Total Cholesterol/HDL:CHD Risk Coronary Heart Disease Risk Table                     Men   Women  1/2 Average Risk   3.4   3.3  Average Risk       5.0   4.4  2 X Average Risk   9.6   7.1  3 X Average Risk  23.4   11.0        Use the calculated Patient Ratio above and the CHD Risk Table to determine the patient's CHD Risk.        ATP III CLASSIFICATION (LDL):  <100     mg/dL   Optimal  100-129  mg/dL   Near or Above                    Optimal  130-159  mg/dL   Borderline  160-189  mg/dL   High  >190     mg/dL   Very High Performed at [facility] [address], [place], [place] [address]          Passed - Patient is not pregnant      Passed - Valid encounter within last 12 months    Recent Outpatient Visits          1 month ago Hospital discharge follow-up   [place], MD    4 months ago Mixed hyperlipidemia   [facility] Juline Patch, MD   10 months ago Post-COVID chronic dyspnea   [facility] Juline Patch, MD   1 year ago Acute maxillary sinusitis, recurrence not specified   [facility] Juline Patch, MD   1 year ago Moderate episode of recurrent major depressive disorder [facility])   [facility] Juline Patch, MD      Future Appointments            In 1 month Juline Patch, MD [facility],  [facility]

## 2021-11-18 ENCOUNTER — Other Ambulatory Visit: Payer: Self-pay | Admitting: Specialist

## 2021-11-18 DIAGNOSIS — R911 Solitary pulmonary nodule: Secondary | ICD-10-CM

## 2021-11-30 ENCOUNTER — Encounter
Admission: RE | Admit: 2021-11-30 | Discharge: 2021-11-30 | Disposition: A | Discharge status: Home / Self Care | Payer: Medicare Other | Source: Ambulatory Visit | Attending: Specialist | Admitting: Specialist

## 2021-11-30 ENCOUNTER — Other Ambulatory Visit: Payer: Self-pay

## 2021-11-30 DIAGNOSIS — J439 Emphysema, unspecified: Secondary | ICD-10-CM | POA: Diagnosis not present

## 2021-11-30 DIAGNOSIS — R911 Solitary pulmonary nodule: Secondary | ICD-10-CM | POA: Diagnosis not present

## 2021-11-30 DIAGNOSIS — I2510 Atherosclerotic heart disease of native coronary artery without angina pectoris: Secondary | ICD-10-CM | POA: Diagnosis not present

## 2021-11-30 DIAGNOSIS — K7460 Unspecified cirrhosis of liver: Secondary | ICD-10-CM | POA: Diagnosis not present

## 2021-11-30 DIAGNOSIS — K8020 Calculus of gallbladder without cholecystitis without obstruction: Secondary | ICD-10-CM | POA: Diagnosis not present

## 2021-11-30 LAB — GLUCOSE, CAPILLARY: Glucose-Capillary: 64 mg/dL — ABNORMAL LOW (ref 70–99)

## 2021-11-30 MED ORDER — FLUDEOXYGLUCOSE F - 18 (FDG) INJECTION
10.9000 | Freq: Once | INTRAVENOUS | Status: AC | PRN
  Administered 2021-11-30: 11.23 via INTRAVENOUS

## 2021-11-30 NOTE — Telephone Encounter (Signed)
Requested medication (s) are due for refill today: yes  Requested medication (s) are on the active medication list: yes  Last refill:  unsure  Future visit scheduled: yes  Notes to clinic:  med was prescribed by ED provider. Historical med but pt is wanting refill     Requested Prescriptions  Pending Prescriptions Disp Refills   losartan (COZAAR) 25 MG tablet      Sig: Take 1 tablet (25 mg total) by mouth daily. singh     Cardiovascular:  Angiotensin Receptor Blockers Passed - 11/30/2021  4:24 PM      Passed - Cr in normal range and within 180 days    Creatinine  Date Value Ref Range Status  08/31/2013 1.53 (H) 0.60 - 1.30 mg/dL Final   Creatinine, Ser  Date Value Ref Range Status  09/25/2021 1.24 0.61 - 1.24 mg/dL Final          Passed - K in normal range and within 180 days    Potassium  Date Value Ref Range Status  09/25/2021 4.0 3.5 - 5.1 mmol/L Final  08/31/2013 3.5 3.5 - 5.1 mmol/L Final          Passed - Patient is not pregnant      Passed - Last BP in normal range    BP Readings from Last 1 Encounters:  11/12/21 (!) 131/58          Passed - Valid encounter within last 6 months    Recent Outpatient Visits           1 month ago Hospital discharge follow-up   [place], MD   5 months ago Mixed hyperlipidemia   [facility] Juline Patch, MD   11 months ago Post-COVID chronic dyspnea   [facility] Juline Patch, MD   1 year ago Acute maxillary sinusitis, recurrence not specified   [facility] Juline Patch, MD   1 year ago Moderate episode of recurrent major depressive disorder [facility])   [facility] Juline Patch, MD       Future Appointments             In 4 weeks Juline Patch, MD [facility], [facility]

## 2021-11-30 NOTE — Telephone Encounter (Signed)
Patient called, left VM to return the call to the office to discuss medication with a nurse.   Summary: pt wife stating can not get refill on Losartan...seems discontinued above?   The administrations shown are only for this specific order and not for other orders for the same medication that may be in this encounter.   Administration Action Time Recorded Time Documented By Site Comment Reason Patient Supplied  Not Given : 25 mg :   : Oral 09/27/21 0920 09/27/21 0930 Guiral, Jun G, RN low BP   Pt is calling wanting to talk to nurse  Baxter Flattery) b/c wants losartan (COZAAR) tablet 25 mg (Discontinued) refilled. Pls FU with [name] his wife. Contact [phone]

## 2021-11-30 NOTE — Telephone Encounter (Signed)
Pt called, notified him that I sent rx request to Dr. Ronnald Ramp and just waiting for refill approval. Pt is asking it be sent to [facility]. Advised him I will send message and let them know.. no further questions noted.

## 2021-12-01 DIAGNOSIS — R0609 Other forms of dyspnea: Secondary | ICD-10-CM | POA: Diagnosis not present

## 2021-12-01 DIAGNOSIS — I952 Hypotension due to drugs: Secondary | ICD-10-CM | POA: Diagnosis not present

## 2021-12-01 DIAGNOSIS — J439 Emphysema, unspecified: Secondary | ICD-10-CM | POA: Diagnosis not present

## 2021-12-01 DIAGNOSIS — R911 Solitary pulmonary nodule: Secondary | ICD-10-CM | POA: Diagnosis not present

## 2021-12-04 ENCOUNTER — Telehealth: Payer: Self-pay

## 2021-12-04 ENCOUNTER — Telehealth: Payer: Self-pay | Admitting: Family Medicine

## 2021-12-04 ENCOUNTER — Other Ambulatory Visit: Payer: Self-pay

## 2021-12-04 DIAGNOSIS — Z20822 Contact with and (suspected) exposure to covid-19: Secondary | ICD-10-CM | POA: Diagnosis not present

## 2021-12-04 DIAGNOSIS — I10 Essential (primary) hypertension: Secondary | ICD-10-CM

## 2021-12-04 MED ORDER — LOSARTAN POTASSIUM 25 MG PO TABS: 25.0000 mg | ORAL_TABLET | Freq: Every day | ORAL | 0 refills | Status: DC

## 2021-12-04 NOTE — Telephone Encounter (Signed)
Copied from [place] [id number]. Topic: General - Other >> Dec 04, 2021 11:08 AM Tessa Lerner A wrote: Reason for CRM: The patient's wife has returned call from practice requesting to speak with a member of clinical staff when possible about the patient's refill of losartan (COZAAR) 25 MG tablet [[id number]]  Please contact further when possible

## 2021-12-04 NOTE — Telephone Encounter (Signed)
I called wife and explained that losartan is prescribed by Dr Candiss Norse . I sent in a 2 weeks supply to [facility] and asked that she call Dr Candiss Norse and get him to send in the RX to [facility]

## 2021-12-09 NOTE — Telephone Encounter (Signed)
Received notification the patient was approved for patient assistance through 10/24/2022.  Provided the following number to call with any questions: [phone].

## 2021-12-15 ENCOUNTER — Other Ambulatory Visit: Payer: Self-pay

## 2021-12-15 ENCOUNTER — Encounter: Payer: Self-pay | Admitting: Oncology

## 2021-12-15 ENCOUNTER — Inpatient Hospital Stay: Payer: Medicare Other | Attending: Oncology

## 2021-12-15 ENCOUNTER — Inpatient Hospital Stay (HOSPITAL_BASED_OUTPATIENT_CLINIC_OR_DEPARTMENT_OTHER): Payer: Medicare Other | Admitting: Oncology

## 2021-12-15 VITALS — BP 127/56 | HR 66 | Temp 97.0°F | Resp 18 | Wt 208.5 lb

## 2021-12-15 DIAGNOSIS — D500 Iron deficiency anemia secondary to blood loss (chronic): Secondary | ICD-10-CM | POA: Insufficient documentation

## 2021-12-15 DIAGNOSIS — Z79899 Other long term (current) drug therapy: Secondary | ICD-10-CM | POA: Diagnosis not present

## 2021-12-15 DIAGNOSIS — E538 Deficiency of other specified B group vitamins: Secondary | ICD-10-CM | POA: Insufficient documentation

## 2021-12-15 DIAGNOSIS — K2100 Gastro-esophageal reflux disease with esophagitis, without bleeding: Secondary | ICD-10-CM | POA: Diagnosis not present

## 2021-12-15 DIAGNOSIS — Z7984 Long term (current) use of oral hypoglycemic drugs: Secondary | ICD-10-CM | POA: Diagnosis not present

## 2021-12-15 DIAGNOSIS — N189 Chronic kidney disease, unspecified: Secondary | ICD-10-CM | POA: Insufficient documentation

## 2021-12-15 DIAGNOSIS — Z7982 Long term (current) use of aspirin: Secondary | ICD-10-CM | POA: Insufficient documentation

## 2021-12-15 LAB — CBC WITH DIFFERENTIAL/PLATELET
Abs Immature Granulocytes: 0.01 10*3/uL (ref 0.00–0.07)
Basophils Absolute: 0 10*3/uL (ref 0.0–0.1)
Basophils Relative: 1 %
Eosinophils Absolute: 0.2 10*3/uL (ref 0.0–0.5)
Eosinophils Relative: 5 %
HCT: 32.1 % — ABNORMAL LOW (ref 39.0–52.0)
Hemoglobin: 10.5 g/dL — ABNORMAL LOW (ref 13.0–17.0)
Immature Granulocytes: 0 %
Lymphocytes Relative: 24 %
Lymphs Abs: 0.8 10*3/uL (ref 0.7–4.0)
MCH: 32.4 pg (ref 26.0–34.0)
MCHC: 32.7 g/dL (ref 30.0–36.0)
MCV: 99.1 fL (ref 80.0–100.0)
Monocytes Absolute: 0.4 10*3/uL (ref 0.1–1.0)
Monocytes Relative: 12 %
Neutro Abs: 2 10*3/uL (ref 1.7–7.7)
Neutrophils Relative %: 58 %
Platelets: 165 10*3/uL (ref 150–400)
RBC: 3.24 MIL/uL — ABNORMAL LOW (ref 4.22–5.81)
RDW: 15.5 % (ref 11.5–15.5)
WBC: 3.4 10*3/uL — ABNORMAL LOW (ref 4.0–10.5)
nRBC: 0 % (ref 0.0–0.2)

## 2021-12-15 LAB — IRON AND TIBC
Iron: 79 ug/dL (ref 45–182)
Saturation Ratios: 20 % (ref 17.9–39.5)
TIBC: 406 ug/dL (ref 250–450)
UIBC: 327 ug/dL

## 2021-12-15 LAB — FERRITIN: Ferritin: 72 ng/mL (ref 24–336)

## 2021-12-15 NOTE — Progress Notes (Signed)
Patient here for follow up. No new concerns voiced.  °

## 2021-12-15 NOTE — Progress Notes (Signed)
Hematology/Oncology Progress Note    Patient Care Team: Juline Patch, MD as PCP - General (Family Medicine) Marcial Pacas, MD as Consulting Physician (Neurology) Samara Deist, DPM as Consulting Physician (Podiatry) Lonia Farber, MD as Consulting Physician (Internal Medicine) Magnus Sinning, MD as Consulting Physician (Nephrology)  REFERRING PROVIDER: Juline Patch, MD  CHIEF COMPLAINTS/REASON FOR VISIT:  Follow  up for anemia  PERTINENT HEMATOLOGY HISTORY  Jeremy [name] is a 71 y.o. male who has above history reviewed by me today presents for follow up visit for anemia 09/24/2021,-09/27/2021, patient had hemoglobin dropped to 5.3 and was advised to go to emergency room be admitted.  Patient received 2 units of PRBC transfusion IV Venofer.    Endoscopy showed gastritis.  Small capsule study showed small bowel polyp.  His hemoglobin improved to 8.2 and patient was discharged. Today patient presents for follow-up. He has called recently due to redness, and swelling at the site of IV.  Patient was recommended to apply warm compress. Today redness and swelling improved. He has been doing satisfactory since discharge.  Fatigue slightly improved.  Denies any melena or bleeding bowel movements.  INTERVAL HISTORY Jeremy [name] is a 71 y.o. male who has above history reviewed by me today presents for follow up visit for management of iron deficiency anemia. Patient reports feeling better.  Denies any black tarry or bloody stool. #Small Bowel Capsule Study Showed Nonbleeding AVM, esophagitis, gastritis, poor prep.  Small bowel polyp, area of erythema in colon obscured by poor prep.   Review of Systems  Constitutional:  Positive for fatigue. Negative for appetite change, chills and fever.  HENT:   Negative for hearing loss and voice change.   Eyes:  Negative for eye problems and icterus.  Respiratory:  Positive for shortness of breath. Negative for chest tightness and cough.    Cardiovascular:  Negative for chest pain and leg swelling.  Gastrointestinal:  Negative for abdominal distention and abdominal pain.  Endocrine: Negative for hot flashes.  Genitourinary:  Negative for difficulty urinating, dysuria and frequency.   Musculoskeletal:  Negative for arthralgias.  Skin:  Negative for itching and rash.  Neurological:  Negative for light-headedness and numbness.  Hematological:  Negative for adenopathy. Does not bruise/bleed easily.  Psychiatric/Behavioral:  Negative for confusion.    MEDICAL HISTORY:  Past Medical History:  Diagnosis Date   Chronic pain    Depression    Diabetes (HCC)    GERD (gastroesophageal reflux disease)    Hyperlipemia    Hypertension    MS (multiple sclerosis) ([place])     SURGICAL HISTORY: Past Surgical History:  Procedure Laterality Date   COLECTOMY  06-2008   COLONOSCOPY  2015   normal   COLONOSCOPY WITH PROPOFOL N/A 09/10/2021   Procedure: COLONOSCOPY WITH PROPOFOL;  Surgeon: [name], MD;  Location: [facility] ENDOSCOPY;  Service: Endoscopy;  Laterality: N/A;   ESOPHAGOGASTRODUODENOSCOPY (EGD) WITH PROPOFOL N/A 09/10/2021   Procedure: ESOPHAGOGASTRODUODENOSCOPY (EGD) WITH PROPOFOL;  Surgeon: [name], MD;  Location: [facility] ENDOSCOPY;  Service: Endoscopy;  Laterality: N/A;   GIVENS CAPSULE STUDY N/A 09/25/2021   Procedure: GIVENS CAPSULE STUDY;  Surgeon: Jonathon Bellows, MD;  Location: [facility] ENDOSCOPY;  Service: Gastroenterology;  Laterality: N/A;    SOCIAL HISTORY: Social History   Socioeconomic History   Marital status: Married    Spouse name: [name]   Number of children: 2   Years of education: GED   Highest education level: Not on file  Occupational History    Comment:  Disabled  Tobacco Use   Smoking status: Former    Packs/day: 1.50    Years: 30.00    Pack years: 45.00    Types: Cigarettes    Quit date: 01/23/2006    Years since quitting: 15.9   Smokeless tobacco: Never   Tobacco comments:    N/A  Vaping Use    Vaping Use: Never used  Substance and Sexual Activity   Alcohol use: Not Currently    Comment: rare; maybe 2 beers a year   Drug use: No   Sexual activity: Not Currently  Other Topics Concern   Not on file  Social History Narrative   Patient is disabled.    Patient lives with his wife [name].    Patient has 2 children.       Social Determinants of Health   Financial Resource Strain: Low Risk    Difficulty of Paying Living Expenses: Not hard at all  Food Insecurity: No Food Insecurity   Worried About Charity fundraiser in the Last Year: Never true   [place] in the Last Year: Never true  Transportation Needs: No Transportation Needs   Lack of Transportation (Medical): No   Lack of Transportation (Non-Medical): No  Physical Activity: Sufficiently Active   Days of Exercise per Week: 7 days   Minutes of Exercise per Session: 30 min  Stress: No Stress Concern Present   Feeling of Stress : Not at all  Social Connections: Moderately Isolated   Frequency of Communication with Friends and Family: More than three times a week   Frequency of Social Gatherings with Friends and Family: Once a week   Attends Religious Services: Never   Marine scientist or Organizations: No   Attends Music therapist: Never   Marital Status: Married  Human resources officer Violence: Not At Risk   Fear of Current or Ex-Partner: No   Emotionally Abused: No   Physically Abused: No   Sexually Abused: No    FAMILY HISTORY: Family History  Problem Relation Age of Onset   Lung cancer Mother    Heart attack Father    Heart attack Brother    COPD Brother    Diabetes Brother    Esophageal cancer Nephew     ALLERGIES:  is allergic to betadine [povidone iodine].  MEDICATIONS:  Current Outpatient Medications  Medication Sig Dispense Refill   albuterol (VENTOLIN HFA) 108 (90 Base) MCG/ACT inhaler Inhale 2 puffs into the lungs in the morning, at noon, in the evening, and at  bedtime.     aspirin 81 MG tablet Take 81 mg by mouth daily.     B-D INSULIN SYRINGE 1CC/25GX1" 25G X 1" 1 ML MISC USE AS DIRECTED 10 each 2   ferrous sulfate 325 (65 FE) MG tablet Take 325 mg by mouth daily with breakfast.     furosemide (LASIX) 40 MG tablet Take 1 tablet (40 mg total) by mouth every other day. (Patient taking differently: Take 20 mg by mouth every other day.) 45 tablet 1   gemfibrozil (LOPID) 600 MG tablet TAKE 1 TABLET(600 MG) BY MOUTH TWICE DAILY BEFORE A MEAL 180 tablet 1   Insulin Glargine (LANTUS) 100 UNIT/ML Solostar Pen Inject 32 Units into the skin at bedtime.     Interferon Beta-1b (BETASERON) 0.3 MG KIT injection Inject subcutaneously 1 syringe every other day. 45 kit 4   loratadine (CLARITIN) 10 MG tablet Take 1 tablet (10 mg total) by mouth daily.  90 tablet 1   losartan (COZAAR) 25 MG tablet Take 1 tablet (25 mg total) by mouth daily. singh 14 tablet 0   metFORMIN (GLUCOPHAGE) 500 MG tablet Take 500 mg by mouth 2 (two) times daily.     omeprazole (PRILOSEC) 40 MG capsule TAKE 1 CAPSULE BY MOUTH EVERY DAY 90 capsule 1   pregabalin (LYRICA) 200 MG capsule Take 1 capsule (200 mg total) by mouth 2 (two) times daily. 60 capsule 5   sertraline (ZOLOFT) 50 MG tablet Take 1/2 tablet daily 45 tablet 1   simvastatin (ZOCOR) 40 MG tablet TAKE 1 TABLET BY MOUTH DAILY 90 tablet 1   sodium bicarbonate 650 MG tablet Take 650 mg by mouth 2 (two) times daily.     traMADol (ULTRAM) 50 MG tablet Take 1 tablet (50 mg total) by mouth every 6 (six) hours as needed. 120 tablet 5   No current facility-administered medications for this visit.     PHYSICAL EXAMINATION: ECOG PERFORMANCE STATUS: 2 - Symptomatic, <50% confined to bed Vitals:   12/15/21 1315  BP: (!) 127/56  Pulse: 66  Resp: 18  Temp: (!) 97 F (36.1 C)   Filed Weights   12/15/21 1315  Weight: 208 lb 8 oz (94.6 kg)    Physical Exam Constitutional:      General: He is not in acute distress. HENT:     Head:  Normocephalic and atraumatic.  Eyes:     General: No scleral icterus. Cardiovascular:     Rate and Rhythm: Normal rate and regular rhythm.     Heart sounds: Normal heart sounds.  Pulmonary:     Effort: Pulmonary effort is normal. No respiratory distress.     Breath sounds: No wheezing.     Comments: Decreased breath sound bilaterally.   Abdominal:     General: Bowel sounds are normal. There is no distension.     Palpations: Abdomen is soft.  Musculoskeletal:        General: No deformity. Normal range of motion.     Cervical back: Normal range of motion and neck supple.  Skin:    General: Skin is warm and dry.     Findings: No erythema or rash.  Neurological:     Mental Status: He is alert and oriented to person, place, and time. Mental status is at baseline.     Cranial Nerves: No cranial nerve deficit.     Coordination: Coordination normal.  Psychiatric:        Mood and Affect: Mood normal.    LABORATORY DATA:  I have reviewed the data as listed Lab Results  Component Value Date   WBC 3.4 (L) 12/15/2021   HGB 10.5 (L) 12/15/2021   HCT 32.1 (L) 12/15/2021   MCV 99.1 12/15/2021   PLT 165 12/15/2021   Recent Labs    12/25/20 1058 06/01/21 0000 07/23/21 1342 09/24/21 1252 09/25/21 0718  NA 141 136*  --  137 137  K 4.9 5.0  --  4.8 4.0  CL 106 106  --  112* 111  CO2 17* 17  --  18* 21*  GLUCOSE 108*  --   --  102* 93  BUN 32* 39*  --  37* 33*  CREATININE 1.31* 1.3  --  1.24 1.24  CALCIUM 9.1 9.3  --  9.1 8.7*  GFRNONAA  --   --   --  >60 >60  PROT 6.9  --  7.3 6.6  --   ALBUMIN 4.1 3.9 3.4*  3.2*  --   AST 18  --  22 16  --   ALT 10  --  14 10  --   ALKPHOS 68  --  58 52  --   BILITOT <0.2  --  0.7 0.4  --   BILIDIR  --   --  <0.1  --   --   IBILI  --   --  NOT CALCULATED  --   --     Iron/TIBC/Ferritin/ %Sat    Component Value Date/Time   IRON 79 12/15/2021 1241   IRON 63 (L) 10/10/2013 1130   TIBC 406 12/15/2021 1241   TIBC 416 10/10/2013 1130    FERRITIN 72 12/15/2021 1241   FERRITIN 90 10/10/2013 1130   IRONPCTSAT 20 12/15/2021 1241   IRONPCTSAT 15 10/10/2013 1130       RADIOGRAPHIC STUDIES: I have personally reviewed the radiological images as listed and agreed with the findings in the report. NM PET Image Initial (PI) Skull Base To Thigh (F-18 FDG)  Result Date: 12/01/2021 CLINICAL DATA:  Initial treatment strategy for pulmonary nodule. EXAM: NUCLEAR MEDICINE PET SKULL BASE TO THIGH TECHNIQUE: 11.23 mCi F-18 FDG was injected intravenously. Full-ring PET imaging was performed from the skull base to thigh after the radiotracer. CT data was obtained and used for attenuation correction and anatomic localization. Fasting blood glucose: 64 mg/dl COMPARISON:  Chest CT October 07, 2021 FINDINGS: Mediastinal blood pool activity: SUV max 2.32 Liver activity: SUV max NA NECK: No hypermetabolic lymph nodes in the neck. Incidental CT findings: Streak artifact from dental hardware. Carotid artery calcifications. CHEST: Solid 12 mm right upper lobe pulmonary nodule on image 93/3 is unchanged in size and does not demonstrate abnormal FDG avidity. No hypermetabolic thoracic adenopathy. Incidental CT findings: Aortic atherosclerosis. Coronary artery calcifications. Normal size heart. Biapical pleuroparenchymal scarring. Emphysematous change. No new suspicious pulmonary nodules or masses. Gynecomastia. ABDOMEN/PELVIS: No abnormal hypermetabolic activity within the liver, pancreas, adrenal glands, or spleen. No hypermetabolic lymph nodes in the abdomen or pelvis. Incidental CT findings: Nodular hepatic contour with widening of the fissures. Cholelithiasis without findings of acute cholecystitis. No hydronephrosis. Colonic anastomotic sutures in the right hemiabdomen. Small calcified nodules anterior to the anastomotic sutures are consistent with sequela of fat necrosis. Sequela of subcutaneous injections in the anterior abdominal wall and posterior gluteal  subcutaneous tissues. SKELETON: No focal hypermetabolic activity to suggest skeletal metastasis. Incidental CT findings: Spondylosis. No aggressive lytic or blastic lesion of bone. IMPRESSION: 1. Unchanged size of the solid non hypermetabolic 12 mm right upper lobe pulmonary nodule, reflecting a benign/indolent process. 2.  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 3.  Cholelithiasis without findings of acute cholecystitis. 4. Aortic Atherosclerosis (ICD10-I70.0) and Emphysema (ICD10-J43.9). Electronically Signed   By: Dahlia Bailiff M.D.   On: 12/01/2021 08:07      ASSESSMENT & PLAN:  1. Iron deficiency anemia due to chronic blood loss   2. B12 deficiency    #Iron deficiency anemia, in the context of chronic kidney disease. Labs reviewed and discussed with patient Hemoglobin has improved to 10.5, ferritin level 72, iron saturation 20.  TIBC 406. Given that he has chronic kidney disease, I recommend to further increase his iron store. We will arrange patient to get IV Venofer  x2. Recommend patient to continue ferrous sulfate 1 tablet daily.  #Esophagitis and gastritis, continue PPI.  Continue follow-up with gastroenterology. Follow-up   #History of vitamin B-12 deficiency.  He previously get B12 injections at home, managed by  primary care provider. Currently he takes oral vitamin B12 supplementation.  I will check B12 at next visit.  Labs in 6 months (cbc,iron,ferr, B12) / MD & poss venofer 1-2 days after labs    Orders Placed This Encounter  Procedures   Comprehensive metabolic panel    Standing Status:   Future    Standing Expiration Date:   12/15/2022   CBC with Differential/Platelet    Standing Status:   Future    Standing Expiration Date:   12/15/2022   Ferritin    Standing Status:   Future    Standing Expiration Date:   12/15/2022   Iron and TIBC    Standing Status:   Future    Standing Expiration Date:   12/15/2022   Vitamin B12    Standing Status:   Future    Standing  Expiration Date:   12/15/2022    All questions were answered. The patient knows to call the clinic with any problems questions or concerns.  cc Juline Patch, MD     Earlie Server, MD, PhD  12/15/2021

## 2021-12-16 ENCOUNTER — Telehealth: Payer: Self-pay

## 2021-12-16 DIAGNOSIS — I10 Essential (primary) hypertension: Secondary | ICD-10-CM | POA: Diagnosis not present

## 2021-12-16 DIAGNOSIS — N1831 Chronic kidney disease, stage 3a: Secondary | ICD-10-CM | POA: Diagnosis not present

## 2021-12-16 DIAGNOSIS — D649 Anemia, unspecified: Secondary | ICD-10-CM | POA: Diagnosis not present

## 2021-12-16 DIAGNOSIS — E1129 Type 2 diabetes mellitus with other diabetic kidney complication: Secondary | ICD-10-CM | POA: Diagnosis not present

## 2021-12-16 NOTE — Telephone Encounter (Signed)
Patient informed of MD recommendation and is agreeable to IV Venofer.

## 2021-12-16 NOTE — Telephone Encounter (Signed)
Spoke to pt, he is aware of appts.  °

## 2021-12-16 NOTE — Telephone Encounter (Signed)
-----   Message from Earlie Server, MD sent at 12/15/2021  8:01 PM EST ----- Please let patient know that iron level has improved, I recommend to further increased the iron store which may help increasing his blood level.  If he agrees, please arrange patient to get IV Venofer every 2 weeks x 2. Keep same follow-up appointment.

## 2021-12-21 ENCOUNTER — Other Ambulatory Visit: Payer: Self-pay

## 2021-12-21 ENCOUNTER — Inpatient Hospital Stay: Payer: Medicare Other

## 2021-12-21 VITALS — BP 128/53 | HR 71 | Temp 97.3°F | Resp 16

## 2021-12-21 DIAGNOSIS — D500 Iron deficiency anemia secondary to blood loss (chronic): Secondary | ICD-10-CM

## 2021-12-21 DIAGNOSIS — K2100 Gastro-esophageal reflux disease with esophagitis, without bleeding: Secondary | ICD-10-CM | POA: Diagnosis not present

## 2021-12-21 DIAGNOSIS — Z7984 Long term (current) use of oral hypoglycemic drugs: Secondary | ICD-10-CM | POA: Diagnosis not present

## 2021-12-21 DIAGNOSIS — Z7982 Long term (current) use of aspirin: Secondary | ICD-10-CM | POA: Diagnosis not present

## 2021-12-21 DIAGNOSIS — D631 Anemia in chronic kidney disease: Secondary | ICD-10-CM

## 2021-12-21 DIAGNOSIS — N189 Chronic kidney disease, unspecified: Secondary | ICD-10-CM | POA: Diagnosis not present

## 2021-12-21 DIAGNOSIS — E538 Deficiency of other specified B group vitamins: Secondary | ICD-10-CM | POA: Diagnosis not present

## 2021-12-21 MED ORDER — SODIUM CHLORIDE 0.9 % IV SOLN: 200.0000 mg | Freq: Once | INTRAVENOUS | Status: DC

## 2021-12-21 MED ORDER — IRON SUCROSE 20 MG/ML IV SOLN
200.0000 mg | Freq: Once | INTRAVENOUS | Status: AC
  Administered 2021-12-21: 200 mg via INTRAVENOUS
  Filled 2021-12-21: qty 10

## 2021-12-21 MED ORDER — SODIUM CHLORIDE 0.9 % IV SOLN
INTRAVENOUS | Status: DC
  Filled 2021-12-21: qty 250

## 2021-12-24 DIAGNOSIS — N182 Chronic kidney disease, stage 2 (mild): Secondary | ICD-10-CM | POA: Diagnosis not present

## 2021-12-24 DIAGNOSIS — I10 Essential (primary) hypertension: Secondary | ICD-10-CM | POA: Diagnosis not present

## 2021-12-24 DIAGNOSIS — R801 Persistent proteinuria, unspecified: Secondary | ICD-10-CM | POA: Diagnosis not present

## 2021-12-24 DIAGNOSIS — E1129 Type 2 diabetes mellitus with other diabetic kidney complication: Secondary | ICD-10-CM | POA: Diagnosis not present

## 2021-12-24 DIAGNOSIS — D649 Anemia, unspecified: Secondary | ICD-10-CM | POA: Diagnosis not present

## 2021-12-28 ENCOUNTER — Other Ambulatory Visit: Payer: Self-pay

## 2021-12-28 ENCOUNTER — Encounter: Payer: Self-pay | Admitting: Family Medicine

## 2021-12-28 ENCOUNTER — Ambulatory Visit (INDEPENDENT_AMBULATORY_CARE_PROVIDER_SITE_OTHER): Payer: Medicare Other | Admitting: Family Medicine

## 2021-12-28 VITALS — BP 130/62 | HR 72 | Ht 70.0 in | Wt 210.0 lb

## 2021-12-28 DIAGNOSIS — F331 Major depressive disorder, recurrent, moderate: Secondary | ICD-10-CM | POA: Diagnosis not present

## 2021-12-28 DIAGNOSIS — I10 Essential (primary) hypertension: Secondary | ICD-10-CM

## 2021-12-28 DIAGNOSIS — E782 Mixed hyperlipidemia: Secondary | ICD-10-CM

## 2021-12-28 DIAGNOSIS — G609 Hereditary and idiopathic neuropathy, unspecified: Secondary | ICD-10-CM

## 2021-12-28 DIAGNOSIS — K219 Gastro-esophageal reflux disease without esophagitis: Secondary | ICD-10-CM

## 2021-12-28 DIAGNOSIS — E349 Endocrine disorder, unspecified: Secondary | ICD-10-CM

## 2021-12-28 DIAGNOSIS — G63 Polyneuropathy in diseases classified elsewhere: Secondary | ICD-10-CM | POA: Diagnosis not present

## 2021-12-28 MED ORDER — SERTRALINE HCL 50 MG PO TABS: ORAL_TABLET | ORAL | 5 refills | Status: DC

## 2021-12-28 MED ORDER — SIMVASTATIN 40 MG PO TABS: 40.0000 mg | ORAL_TABLET | Freq: Every day | ORAL | 1 refills | Status: DC

## 2021-12-28 MED ORDER — LORATADINE 10 MG PO TABS: 10.0000 mg | ORAL_TABLET | Freq: Every day | ORAL | 1 refills | Status: DC

## 2021-12-28 MED ORDER — GEMFIBROZIL 600 MG PO TABS: ORAL_TABLET | ORAL | 1 refills | Status: DC

## 2021-12-28 MED ORDER — SERTRALINE HCL 50 MG PO TABS: ORAL_TABLET | ORAL | 1 refills | Status: DC

## 2021-12-28 MED ORDER — LOSARTAN POTASSIUM 25 MG PO TABS: 25.0000 mg | ORAL_TABLET | Freq: Every day | ORAL | 1 refills | Status: DC

## 2021-12-28 MED ORDER — PREGABALIN 200 MG PO CAPS: 200.0000 mg | ORAL_CAPSULE | Freq: Two times a day (BID) | ORAL | 5 refills | Status: DC

## 2021-12-28 MED ORDER — OMEPRAZOLE 40 MG PO CPDR: DELAYED_RELEASE_CAPSULE | ORAL | 1 refills | Status: DC

## 2021-12-28 NOTE — Progress Notes (Signed)
Date:  12/28/2021   Name:  Jeremy [name]   DOB:  16-Oct-1951   MRN:  [id number]   Chief Complaint: Hypertension, Hyperlipidemia, Gastroesophageal Reflux, Depression, and Peripheral Neuropathy  Hypertension This is a chronic problem. The current episode started more than 1 year ago. The problem has been gradually improving since onset. The problem is controlled. Pertinent negatives include no chest pain, headaches, neck pain, palpitations, peripheral edema or shortness of breath. Risk factors for coronary artery disease include dyslipidemia. Past treatments include angiotensin blockers. The current treatment provides moderate improvement. There are no compliance problems.  There is no history of angina, kidney disease, CAD/MI, CVA, heart failure, left ventricular hypertrophy, PVD or retinopathy. There is no history of chronic renal disease, a hypertension causing med or renovascular disease.  Hyperlipidemia This is a chronic problem. The current episode started more than 1 year ago. The problem is controlled. Recent lipid tests were reviewed and are normal. Exacerbating diseases include diabetes. He has no history of chronic renal disease. Pertinent negatives include no chest pain, myalgias or shortness of breath. Current antihyperlipidemic treatment includes statins and fibric acid derivatives. The current treatment provides moderate improvement of lipids. There are no compliance problems.   Gastroesophageal Reflux He reports no abdominal pain, no chest pain, no coughing, no dysphagia, no heartburn, no hoarse voice, no nausea, no sore throat or no wheezing. This is a new problem. The current episode started more than 1 year ago. He has tried a PPI for the symptoms. The treatment provided moderate relief.  Depression        This is a chronic problem.  The current episode started more than 1 year ago.   The onset quality is gradual.   The problem has been gradually improving since onset.  Associated  symptoms include no myalgias, no headaches and no suicidal ideas.  Past treatments include SSRIs - Selective serotonin reuptake inhibitors.  Compliance with treatment is variable.  Lab Results  Component Value Date   NA 137 09/25/2021   K 4.0 09/25/2021   CO2 21 (L) 09/25/2021   GLUCOSE 93 09/25/2021   BUN 33 (H) 09/25/2021   CREATININE 1.24 09/25/2021   CALCIUM 8.7 (L) 09/25/2021   EGFR 59 (L) 12/25/2020   GFRNONAA >60 09/25/2021   Lab Results  Component Value Date   CHOL 103 07/10/2021   HDL 22 (L) 07/10/2021   LDLCALC 35 07/10/2021   TRIG 228 (H) 07/10/2021   CHOLHDL 4.7 07/10/2021   Lab Results  Component Value Date   TSH 1.90 03/30/2021   Lab Results  Component Value Date   HGBA1C 5.1 09/24/2021   Lab Results  Component Value Date   WBC 3.4 (L) 12/15/2021   HGB 10.5 (L) 12/15/2021   HCT 32.1 (L) 12/15/2021   MCV 99.1 12/15/2021   PLT 165 12/15/2021   Lab Results  Component Value Date   ALT 10 09/24/2021   AST 16 09/24/2021   ALKPHOS 52 09/24/2021   BILITOT 0.4 09/24/2021   No results found for: 25OHVITD2, 25OHVITD3, VD25OH   Review of Systems  Constitutional:  Negative for chills and fever.  HENT:  Negative for drooling, ear discharge, ear pain, hoarse voice and sore throat.   Respiratory:  Negative for cough, shortness of breath and wheezing.   Cardiovascular:  Negative for chest pain, palpitations and leg swelling.  Gastrointestinal:  Negative for abdominal pain, blood in stool, constipation, diarrhea, dysphagia, heartburn and nausea.  Endocrine: Negative for polydipsia.  Genitourinary:  Negative for dysuria, frequency, hematuria and urgency.  Musculoskeletal:  Negative for back pain, myalgias and neck pain.  Skin:  Negative for rash.  Allergic/Immunologic: Negative for environmental allergies.  Neurological:  Negative for dizziness and headaches.  Hematological:  Does not bruise/bleed easily.  Psychiatric/Behavioral:  Positive for depression.  Negative for suicidal ideas. The patient is not nervous/anxious.    Patient Active Problem List   Diagnosis Date Noted   Symptomatic anemia 09/24/2021   Gastric polyp    Gastritis without bleeding    Anemia in stage 3a chronic kidney disease ([place]) 06/19/2021   Iron deficiency anemia due to chronic blood loss 06/19/2021   Gait abnormality 05/07/2021   Chronic kidney disease, stage 3 unspecified ([place]) 05/20/2020   DM type 2 with diabetic peripheral neuropathy ([place]) 05/06/2020   Idiopathic peripheral neuropathy 01/15/2020   Generalized edema 01/15/2020   Primary pulmonary hypertension ([place]) 10/28/2019   Therapeutic drug monitoring 03/23/2018   Mild episode of recurrent major depressive disorder ([place]) 09/26/2017   Ventricular ectopic beats 09/26/2017   Type 2 diabetes mellitus with diabetic polyneuropathy, with long-term current use of insulin ([place]) 08/12/2017   Hyperlipidemia due to type 2 diabetes mellitus ([place]) 08/12/2017   B12 deficiency 12/14/2016   Low back pain 03/10/2016   Lumbosacral disc disease 01/01/2016   Neuropathy associated with endocrine disorder ([place]) 11/19/2015   B12 deficiency anemia 06/03/2015   Essential hypertension 06/03/2015   Hyperlipidemia 06/03/2015   Depression 06/03/2015   Gastroesophageal reflux disease without esophagitis 06/03/2015   Edema extremities 06/03/2015   Multiple sclerosis ([place]) 07/26/2013   Abnormality of gait 07/26/2013   Morbid obesity ([place]) 07/26/2013    Allergies  Allergen Reactions   Betadine [Povidone Iodine]     Past Surgical History:  Procedure Laterality Date   COLECTOMY  06-2008   COLONOSCOPY  2015   normal   COLONOSCOPY WITH PROPOFOL N/A 09/10/2021   Procedure: COLONOSCOPY WITH PROPOFOL;  Surgeon: [name], MD;  Location: [facility] ENDOSCOPY;  Service: Endoscopy;  Laterality: N/A;   ESOPHAGOGASTRODUODENOSCOPY (EGD) WITH PROPOFOL N/A 09/10/2021   Procedure: ESOPHAGOGASTRODUODENOSCOPY (EGD) WITH PROPOFOL;  Surgeon: [name], MD;  Location: [facility] ENDOSCOPY;  Service: Endoscopy;  Laterality: N/A;   GIVENS CAPSULE STUDY N/A 09/25/2021   Procedure: GIVENS CAPSULE STUDY;  Surgeon: Jonathon Bellows, MD;  Location: [facility] ENDOSCOPY;  Service: Gastroenterology;  Laterality: N/A;    Social History   Tobacco Use   Smoking status: Former    Packs/day: 1.50    Years: 30.00    Pack years: 45.00    Types: Cigarettes    Quit date: 01/23/2006    Years since quitting: 15.9   Smokeless tobacco: Never   Tobacco comments:    N/A  Vaping Use   Vaping Use: Never used  Substance Use Topics   Alcohol use: Not Currently    Comment: rare; maybe 2 beers a year   Drug use: No     Medication list has been reviewed and updated.  Current Meds  Medication Sig   albuterol (VENTOLIN HFA) 108 (90 Base) MCG/ACT inhaler Inhale 2 puffs into the lungs in the morning, at noon, in the evening, and at bedtime.   aspirin 81 MG tablet Take 81 mg by mouth daily.   B-D INSULIN SYRINGE 1CC/25GX1" 25G X 1" 1 ML MISC USE AS DIRECTED   ferrous sulfate 325 (65 FE) MG tablet Take 325 mg by mouth daily with breakfast.   furosemide (LASIX) 40 MG tablet Take 1 tablet (40 mg  total) by mouth every other day. (Patient taking differently: Take 20 mg by mouth every other day.)   gemfibrozil (LOPID) 600 MG tablet TAKE 1 TABLET(600 MG) BY MOUTH TWICE DAILY BEFORE A MEAL   Insulin Glargine (LANTUS) 100 UNIT/ML Solostar Pen Inject 32 Units into the skin at bedtime.   Interferon Beta-1b (BETASERON) 0.3 MG KIT injection Inject subcutaneously 1 syringe every other day.   loratadine (CLARITIN) 10 MG tablet Take 1 tablet (10 mg total) by mouth daily.   losartan (COZAAR) 25 MG tablet Take 1 tablet (25 mg total) by mouth daily. singh   metFORMIN (GLUCOPHAGE) 500 MG tablet Take 500 mg by mouth 2 (two) times daily.   omeprazole (PRILOSEC) 40 MG capsule TAKE 1 CAPSULE BY MOUTH EVERY DAY   pregabalin (LYRICA) 200 MG capsule Take 1 capsule (200 mg total) by mouth 2 (two)  times daily.   sertraline (ZOLOFT) 50 MG tablet Take 1/2 tablet daily   simvastatin (ZOCOR) 40 MG tablet TAKE 1 TABLET BY MOUTH DAILY   sodium bicarbonate 650 MG tablet Take 650 mg by mouth 2 (two) times daily.   traMADol (ULTRAM) 50 MG tablet Take 1 tablet (50 mg total) by mouth every 6 (six) hours as needed.    PHQ 2/9 Scores 12/28/2021 11/16/2021 10/05/2021 06/30/2021  PHQ - 2 Score 0 0 0 0  PHQ- 9 Score 0 - 0 0    GAD 7 : Generalized Anxiety Score 12/28/2021 06/30/2021 09/11/2020 07/07/2020  Nervous, Anxious, on Edge 0 0 0 0  Control/stop worrying 0 0 0 0  Worry too much - different things 0 0 0 0  Trouble relaxing 0 0 0 0  Restless 0 0 0 0  Easily annoyed or irritable 0 0 0 0  Afraid - awful might happen 0 0 0 0  Total GAD 7 Score 0 0 0 0  Anxiety Difficulty Not difficult at all - - -    BP Readings from Last 3 Encounters:  12/28/21 130/62  12/21/21 (!) 128/53  12/15/21 (!) 127/56    Physical Exam Vitals and nursing note reviewed.  HENT:     Head: Normocephalic.     Right Ear: Tympanic membrane and external ear normal.     Left Ear: Tympanic membrane and external ear normal.     Nose: Nose normal.  Eyes:     General: No scleral icterus.       Right eye: No discharge.        Left eye: No discharge.     Conjunctiva/sclera: Conjunctivae normal.     Pupils: Pupils are equal, round, and reactive to light.  Neck:     Thyroid: No thyromegaly.     Vascular: No JVD.     Trachea: No tracheal deviation.  Cardiovascular:     Rate and Rhythm: Normal rate and regular rhythm.     Heart sounds: Normal heart sounds, S1 normal and S2 normal. No murmur heard. No systolic murmur is present.  No diastolic murmur is present.    No friction rub. No gallop. No S3 or S4 sounds.  Pulmonary:     Effort: No respiratory distress.     Breath sounds: Normal breath sounds. No decreased breath sounds, wheezing, rhonchi or rales.  Abdominal:     General: Bowel sounds are normal.     Palpations:  Abdomen is soft. There is no mass.     Tenderness: There is no abdominal tenderness. There is no guarding or rebound.  Musculoskeletal:  General: No tenderness. Normal range of motion.     Cervical back: Normal range of motion and neck supple.  Lymphadenopathy:     Cervical: No cervical adenopathy.  Skin:    General: Skin is warm.     Findings: No rash.  Neurological:     Mental Status: He is alert and oriented to person, place, and time.     Cranial Nerves: No cranial nerve deficit.     Deep Tendon Reflexes: Reflexes are normal and symmetric.    Wt Readings from Last 3 Encounters:  12/28/21 210 lb (95.3 kg)  12/15/21 208 lb 8 oz (94.6 kg)  11/12/21 210 lb (95.3 kg)    BP 130/62    Pulse 72    Ht 5' 10" (1.778 m)    Wt 210 lb (95.3 kg)    BMI 30.13 kg/m   Assessment and Plan:  1. Essential hypertension Chronic.  Controlled.  Stable.  Continue losartan 25 mg once a day.  We will continue current dosing and will recheck in 6 months.  Review of previous renal function panels per Dr. Candiss Norse is acceptable. - losartan (COZAAR) 25 MG tablet; Take 1 tablet (25 mg total) by mouth daily. singh  Dispense: 90 tablet; Refill: 1  2. Mixed hyperlipidemia Chronic.  Controlled.  Stable.  Continue simvastatin 40 mg once a day and gemfibrozil 600 mg twice a day.  Review of of the previous lipid panel is acceptable - gemfibrozil (LOPID) 600 MG tablet; TAKE 1 TABLET(600 MG) BY MOUTH TWICE DAILY BEFORE A MEAL  Dispense: 180 tablet; Refill: 1 - simvastatin (ZOCOR) 40 MG tablet; Take 1 tablet (40 mg total) by mouth daily.  Dispense: 90 tablet; Refill: 1  3. Gastroesophageal reflux disease without esophagitis Chronic.  Controlled.  Stable.  Continue omeprazole 40 mg once a day. - omeprazole (PRILOSEC) 40 MG capsule; TAKE 1 CAPSULE BY MOUTH EVERY DAY  Dispense: 90 capsule; Refill: 1  4. 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  Controlled.  Stable.  Continue Lyrica 200 mg 1  capsule twice a day.  This was filled as a favor in the past and that patient had difficulty getting to neurology and we have requested the patient return to maintenance by neurology - pregabalin (LYRICA) 200 MG capsule; Take 1 capsule (200 mg total) by mouth 2 (two) times daily.  Dispense: 60 capsule; Refill: 5  5. Idiopathic peripheral neuropathy Chronic.  Persistent.  Stable.  As noted above. - pregabalin (LYRICA) 200 MG capsule; Take 1 capsule (200 mg total) by mouth 2 (two) times daily.  Dispense: 60 capsule; Refill: 5  6. 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  Controlled.  Stable.  PHQ is 0.  Gad score is 0.  Continue sertraline 50 mg 1-1/2 tablet daily. - sertraline (ZOLOFT) 50 MG tablet; Take 1/2 tablet daily  Dispense: 45 tablet; Refill: 1

## 2021-12-30 ENCOUNTER — Other Ambulatory Visit: Payer: Medicare Other

## 2022-01-01 ENCOUNTER — Inpatient Hospital Stay: Payer: Medicare Other | Admitting: Oncology

## 2022-01-01 ENCOUNTER — Ambulatory Visit: Payer: Medicare Other

## 2022-01-04 ENCOUNTER — Inpatient Hospital Stay: Payer: Medicare Other | Attending: Oncology

## 2022-01-04 ENCOUNTER — Other Ambulatory Visit: Payer: Self-pay

## 2022-01-04 VITALS — BP 149/61 | HR 70 | Temp 97.9°F | Resp 18

## 2022-01-04 DIAGNOSIS — D500 Iron deficiency anemia secondary to blood loss (chronic): Secondary | ICD-10-CM | POA: Diagnosis not present

## 2022-01-04 DIAGNOSIS — E538 Deficiency of other specified B group vitamins: Secondary | ICD-10-CM | POA: Insufficient documentation

## 2022-01-04 DIAGNOSIS — K2970 Gastritis, unspecified, without bleeding: Secondary | ICD-10-CM | POA: Insufficient documentation

## 2022-01-04 DIAGNOSIS — D631 Anemia in chronic kidney disease: Secondary | ICD-10-CM

## 2022-01-04 MED ORDER — IRON SUCROSE 20 MG/ML IV SOLN
200.0000 mg | Freq: Once | INTRAVENOUS | Status: AC
  Administered 2022-01-04: 200 mg via INTRAVENOUS
  Filled 2022-01-04: qty 10

## 2022-01-04 MED ORDER — SODIUM CHLORIDE 0.9 % IV SOLN
INTRAVENOUS | Status: DC | PRN
  Filled 2022-01-04: qty 250

## 2022-01-04 MED ORDER — SODIUM CHLORIDE 0.9 % IV SOLN: 200.0000 mg | Freq: Once | INTRAVENOUS | Status: DC

## 2022-01-13 DIAGNOSIS — E1142 Type 2 diabetes mellitus with diabetic polyneuropathy: Secondary | ICD-10-CM | POA: Diagnosis not present

## 2022-01-13 DIAGNOSIS — B351 Tinea unguium: Secondary | ICD-10-CM | POA: Diagnosis not present

## 2022-01-22 DIAGNOSIS — Z20822 Contact with and (suspected) exposure to covid-19: Secondary | ICD-10-CM | POA: Diagnosis not present

## 2022-02-03 ENCOUNTER — Other Ambulatory Visit: Payer: Self-pay

## 2022-02-03 ENCOUNTER — Inpatient Hospital Stay
Admission: EM | Admit: 2022-02-03 | Discharge: 2022-02-05 | DRG: 871 | Disposition: A | Discharge status: Home / Self Care | Payer: Medicare Other | Attending: Internal Medicine | Admitting: Internal Medicine

## 2022-02-03 ENCOUNTER — Emergency Department: Payer: Medicare Other

## 2022-02-03 ENCOUNTER — Encounter: Payer: Self-pay | Admitting: Emergency Medicine

## 2022-02-03 DIAGNOSIS — D631 Anemia in chronic kidney disease: Secondary | ICD-10-CM | POA: Diagnosis present

## 2022-02-03 DIAGNOSIS — Z888 Allergy status to other drugs, medicaments and biological substances status: Secondary | ICD-10-CM

## 2022-02-03 DIAGNOSIS — G8929 Other chronic pain: Secondary | ICD-10-CM | POA: Diagnosis present

## 2022-02-03 DIAGNOSIS — R6510 Systemic inflammatory response syndrome (SIRS) of non-infectious origin without acute organ dysfunction: Secondary | ICD-10-CM

## 2022-02-03 DIAGNOSIS — E871 Hypo-osmolality and hyponatremia: Secondary | ICD-10-CM | POA: Diagnosis present

## 2022-02-03 DIAGNOSIS — J449 Chronic obstructive pulmonary disease, unspecified: Secondary | ICD-10-CM

## 2022-02-03 DIAGNOSIS — I129 Hypertensive chronic kidney disease with stage 1 through stage 4 chronic kidney disease, or unspecified chronic kidney disease: Secondary | ICD-10-CM | POA: Diagnosis present

## 2022-02-03 DIAGNOSIS — J9621 Acute and chronic respiratory failure with hypoxia: Secondary | ICD-10-CM | POA: Diagnosis not present

## 2022-02-03 DIAGNOSIS — R0902 Hypoxemia: Secondary | ICD-10-CM | POA: Diagnosis not present

## 2022-02-03 DIAGNOSIS — Z825 Family history of asthma and other chronic lower respiratory diseases: Secondary | ICD-10-CM

## 2022-02-03 DIAGNOSIS — E785 Hyperlipidemia, unspecified: Secondary | ICD-10-CM | POA: Diagnosis not present

## 2022-02-03 DIAGNOSIS — J189 Pneumonia, unspecified organism: Secondary | ICD-10-CM

## 2022-02-03 DIAGNOSIS — Z79899 Other long term (current) drug therapy: Secondary | ICD-10-CM

## 2022-02-03 DIAGNOSIS — K7460 Unspecified cirrhosis of liver: Secondary | ICD-10-CM | POA: Diagnosis present

## 2022-02-03 DIAGNOSIS — Z7982 Long term (current) use of aspirin: Secondary | ICD-10-CM

## 2022-02-03 DIAGNOSIS — Z6830 Body mass index (BMI) 30.0-30.9, adult: Secondary | ICD-10-CM

## 2022-02-03 DIAGNOSIS — Z8249 Family history of ischemic heart disease and other diseases of the circulatory system: Secondary | ICD-10-CM | POA: Diagnosis not present

## 2022-02-03 DIAGNOSIS — R531 Weakness: Secondary | ICD-10-CM | POA: Diagnosis not present

## 2022-02-03 DIAGNOSIS — R0689 Other abnormalities of breathing: Secondary | ICD-10-CM | POA: Diagnosis not present

## 2022-02-03 DIAGNOSIS — Z20822 Contact with and (suspected) exposure to covid-19: Secondary | ICD-10-CM | POA: Diagnosis present

## 2022-02-03 DIAGNOSIS — Z9049 Acquired absence of other specified parts of digestive tract: Secondary | ICD-10-CM

## 2022-02-03 DIAGNOSIS — Z801 Family history of malignant neoplasm of trachea, bronchus and lung: Secondary | ICD-10-CM | POA: Diagnosis not present

## 2022-02-03 DIAGNOSIS — R000 Tachycardia, unspecified: Secondary | ICD-10-CM | POA: Diagnosis not present

## 2022-02-03 DIAGNOSIS — N1831 Chronic kidney disease, stage 3a: Secondary | ICD-10-CM | POA: Diagnosis not present

## 2022-02-03 DIAGNOSIS — A419 Sepsis, unspecified organism: Secondary | ICD-10-CM | POA: Diagnosis not present

## 2022-02-03 DIAGNOSIS — J439 Emphysema, unspecified: Secondary | ICD-10-CM | POA: Diagnosis not present

## 2022-02-03 DIAGNOSIS — G35 Multiple sclerosis: Secondary | ICD-10-CM | POA: Diagnosis present

## 2022-02-03 DIAGNOSIS — J9601 Acute respiratory failure with hypoxia: Secondary | ICD-10-CM | POA: Diagnosis not present

## 2022-02-03 DIAGNOSIS — E669 Obesity, unspecified: Secondary | ICD-10-CM | POA: Diagnosis present

## 2022-02-03 DIAGNOSIS — J168 Pneumonia due to other specified infectious organisms: Secondary | ICD-10-CM | POA: Diagnosis not present

## 2022-02-03 DIAGNOSIS — Z800 Family history of malignant neoplasm of digestive organs: Secondary | ICD-10-CM

## 2022-02-03 DIAGNOSIS — R0602 Shortness of breath: Secondary | ICD-10-CM | POA: Diagnosis not present

## 2022-02-03 DIAGNOSIS — E1122 Type 2 diabetes mellitus with diabetic chronic kidney disease: Secondary | ICD-10-CM | POA: Diagnosis not present

## 2022-02-03 DIAGNOSIS — E875 Hyperkalemia: Secondary | ICD-10-CM | POA: Diagnosis present

## 2022-02-03 DIAGNOSIS — I10 Essential (primary) hypertension: Secondary | ICD-10-CM | POA: Diagnosis present

## 2022-02-03 DIAGNOSIS — J441 Chronic obstructive pulmonary disease with (acute) exacerbation: Secondary | ICD-10-CM

## 2022-02-03 DIAGNOSIS — D649 Anemia, unspecified: Secondary | ICD-10-CM | POA: Diagnosis present

## 2022-02-03 DIAGNOSIS — Z7984 Long term (current) use of oral hypoglycemic drugs: Secondary | ICD-10-CM

## 2022-02-03 DIAGNOSIS — K219 Gastro-esophageal reflux disease without esophagitis: Secondary | ICD-10-CM | POA: Diagnosis not present

## 2022-02-03 DIAGNOSIS — R6520 Severe sepsis without septic shock: Secondary | ICD-10-CM | POA: Diagnosis not present

## 2022-02-03 DIAGNOSIS — Z87891 Personal history of nicotine dependence: Secondary | ICD-10-CM | POA: Diagnosis not present

## 2022-02-03 DIAGNOSIS — F32A Depression, unspecified: Secondary | ICD-10-CM | POA: Diagnosis present

## 2022-02-03 DIAGNOSIS — Z794 Long term (current) use of insulin: Secondary | ICD-10-CM

## 2022-02-03 DIAGNOSIS — Z833 Family history of diabetes mellitus: Secondary | ICD-10-CM

## 2022-02-03 DIAGNOSIS — R601 Generalized edema: Secondary | ICD-10-CM

## 2022-02-03 DIAGNOSIS — I959 Hypotension, unspecified: Secondary | ICD-10-CM | POA: Diagnosis not present

## 2022-02-03 DIAGNOSIS — E1142 Type 2 diabetes mellitus with diabetic polyneuropathy: Secondary | ICD-10-CM

## 2022-02-03 DIAGNOSIS — E1165 Type 2 diabetes mellitus with hyperglycemia: Secondary | ICD-10-CM | POA: Diagnosis present

## 2022-02-03 LAB — CBC WITH DIFFERENTIAL/PLATELET
Abs Immature Granulocytes: 0.07 10*3/uL (ref 0.00–0.07)
Basophils Absolute: 0 10*3/uL (ref 0.0–0.1)
Basophils Relative: 0 %
Eosinophils Absolute: 0 10*3/uL (ref 0.0–0.5)
Eosinophils Relative: 0 %
HCT: 35.8 % — ABNORMAL LOW (ref 39.0–52.0)
Hemoglobin: 11.3 g/dL — ABNORMAL LOW (ref 13.0–17.0)
Immature Granulocytes: 0 %
Lymphocytes Relative: 4 %
Lymphs Abs: 0.6 10*3/uL — ABNORMAL LOW (ref 0.7–4.0)
MCH: 31.6 pg (ref 26.0–34.0)
MCHC: 31.6 g/dL (ref 30.0–36.0)
MCV: 100 fL (ref 80.0–100.0)
Monocytes Absolute: 0.9 10*3/uL (ref 0.1–1.0)
Monocytes Relative: 6 %
Neutro Abs: 14.7 10*3/uL — ABNORMAL HIGH (ref 1.7–7.7)
Neutrophils Relative %: 90 %
Platelets: 175 10*3/uL (ref 150–400)
RBC: 3.58 MIL/uL — ABNORMAL LOW (ref 4.22–5.81)
RDW: 14.6 % (ref 11.5–15.5)
WBC: 16.3 10*3/uL — ABNORMAL HIGH (ref 4.0–10.5)
nRBC: 0 % (ref 0.0–0.2)

## 2022-02-03 LAB — URINALYSIS, ROUTINE W REFLEX MICROSCOPIC
Bacteria, UA: NONE SEEN
Bilirubin Urine: NEGATIVE
Glucose, UA: NEGATIVE mg/dL
Ketones, ur: NEGATIVE mg/dL
Leukocytes,Ua: NEGATIVE
Nitrite: NEGATIVE
Protein, ur: 100 mg/dL — AB
Specific Gravity, Urine: 1.033 — ABNORMAL HIGH (ref 1.005–1.030)
pH: 5 (ref 5.0–8.0)

## 2022-02-03 LAB — COMPREHENSIVE METABOLIC PANEL
ALT: 10 U/L (ref 0–44)
AST: 18 U/L (ref 15–41)
Albumin: 3.5 g/dL (ref 3.5–5.0)
Alkaline Phosphatase: 79 U/L (ref 38–126)
Anion gap: 9 (ref 5–15)
BUN: 39 mg/dL — ABNORMAL HIGH (ref 8–23)
CO2: 20 mmol/L — ABNORMAL LOW (ref 22–32)
Calcium: 8.9 mg/dL (ref 8.9–10.3)
Chloride: 108 mmol/L (ref 98–111)
Creatinine, Ser: 1.48 mg/dL — ABNORMAL HIGH (ref 0.61–1.24)
GFR, Estimated: 51 mL/min — ABNORMAL LOW (ref >60)
Glucose, Bld: 124 mg/dL — ABNORMAL HIGH (ref 70–99)
Potassium: 5.3 mmol/L — ABNORMAL HIGH (ref 3.5–5.1)
Sodium: 137 mmol/L (ref 135–145)
Total Bilirubin: 0.8 mg/dL (ref 0.3–1.2)
Total Protein: 7.9 g/dL (ref 6.5–8.1)

## 2022-02-03 LAB — LACTIC ACID, PLASMA: Lactic Acid, Venous: 1.1 mmol/L (ref 0.5–1.9)

## 2022-02-03 LAB — CBG MONITORING, ED: Glucose-Capillary: 141 mg/dL — ABNORMAL HIGH (ref 70–99)

## 2022-02-03 LAB — RESP PANEL BY RT-PCR (FLU A&B, COVID) ARPGX2
Influenza A by PCR: NEGATIVE
Influenza B by PCR: NEGATIVE
SARS Coronavirus 2 by RT PCR: NEGATIVE

## 2022-02-03 LAB — PROTIME-INR
INR: 1.3 — ABNORMAL HIGH (ref 0.8–1.2)
Prothrombin Time: 16.1 seconds — ABNORMAL HIGH (ref 11.4–15.2)

## 2022-02-03 MED ORDER — LACTATED RINGERS IV SOLN: INTRAVENOUS | Status: DC

## 2022-02-03 MED ORDER — INSULIN ASPART 100 UNIT/ML IJ SOLN: 0.0000 [IU] | Freq: Every day | INTRAMUSCULAR | Status: DC

## 2022-02-03 MED ORDER — FERROUS SULFATE 325 (65 FE) MG PO TABS
325.0000 mg | ORAL_TABLET | Freq: Every day | ORAL | Status: DC
  Administered 2022-02-04 – 2022-02-05 (×2): 325 mg via ORAL
  Filled 2022-02-03 (×2): qty 1

## 2022-02-03 MED ORDER — INSULIN ASPART 100 UNIT/ML IJ SOLN
0.0000 [IU] | Freq: Three times a day (TID) | INTRAMUSCULAR | Status: DC
  Administered 2022-02-04: 3 [IU] via SUBCUTANEOUS
  Administered 2022-02-04: 5 [IU] via SUBCUTANEOUS
  Administered 2022-02-04: 11 [IU] via SUBCUTANEOUS
  Filled 2022-02-03 (×3): qty 1

## 2022-02-03 MED ORDER — ONDANSETRON HCL 4 MG/2ML IJ SOLN
4.0000 mg | Freq: Four times a day (QID) | INTRAMUSCULAR | Status: DC | PRN
Start: 2022-02-03 — End: 2022-02-05

## 2022-02-03 MED ORDER — ACETAMINOPHEN 650 MG RE SUPP: 650.0000 mg | Freq: Four times a day (QID) | RECTAL | Status: DC | PRN

## 2022-02-03 MED ORDER — PREGABALIN 75 MG PO CAPS
200.0000 mg | ORAL_CAPSULE | Freq: Two times a day (BID) | ORAL | Status: DC
  Administered 2022-02-04 – 2022-02-05 (×3): 200 mg via ORAL
  Filled 2022-02-03 (×3): qty 1

## 2022-02-03 MED ORDER — SERTRALINE HCL 50 MG PO TABS
25.0000 mg | ORAL_TABLET | Freq: Every day | ORAL | Status: DC
  Administered 2022-02-04 – 2022-02-05 (×2): 25 mg via ORAL
  Filled 2022-02-03 (×2): qty 1

## 2022-02-03 MED ORDER — SODIUM BICARBONATE 650 MG PO TABS
650.0000 mg | ORAL_TABLET | Freq: Two times a day (BID) | ORAL | Status: DC
  Administered 2022-02-04 – 2022-02-05 (×3): 650 mg via ORAL
  Filled 2022-02-03 (×3): qty 1

## 2022-02-03 MED ORDER — METHYLPREDNISOLONE SODIUM SUCC 125 MG IJ SOLR
125.0000 mg | Freq: Once | INTRAMUSCULAR | Status: AC
  Administered 2022-02-03: 125 mg via INTRAVENOUS
  Filled 2022-02-03: qty 2

## 2022-02-03 MED ORDER — ONDANSETRON HCL 4 MG PO TABS
4.0000 mg | ORAL_TABLET | Freq: Four times a day (QID) | ORAL | Status: DC | PRN
Start: 2022-02-03 — End: 2022-02-05

## 2022-02-03 MED ORDER — ALBUTEROL SULFATE (2.5 MG/3ML) 0.083% IN NEBU
2.5000 mg | INHALATION_SOLUTION | RESPIRATORY_TRACT | Status: DC | PRN
  Administered 2022-02-04: 2.5 mg via RESPIRATORY_TRACT
  Filled 2022-02-03: qty 3

## 2022-02-03 MED ORDER — ENOXAPARIN SODIUM 60 MG/0.6ML IJ SOSY
0.5000 mg/kg | PREFILLED_SYRINGE | INTRAMUSCULAR | Status: DC
  Administered 2022-02-04 (×2): 47.5 mg via SUBCUTANEOUS
  Filled 2022-02-03 (×2): qty 0.6

## 2022-02-03 MED ORDER — ASPIRIN EC 81 MG PO TBEC
81.0000 mg | DELAYED_RELEASE_TABLET | Freq: Every day | ORAL | Status: DC
  Administered 2022-02-04 – 2022-02-05 (×2): 81 mg via ORAL
  Filled 2022-02-03 (×2): qty 1

## 2022-02-03 MED ORDER — SODIUM CHLORIDE 0.9 % IV SOLN
2.0000 g | INTRAVENOUS | Status: DC
  Administered 2022-02-04: 2 g via INTRAVENOUS
  Filled 2022-02-03 (×2): qty 20

## 2022-02-03 MED ORDER — INSULIN GLARGINE-YFGN 100 UNIT/ML ~~LOC~~ SOLN
20.0000 [IU] | Freq: Every day | SUBCUTANEOUS | Status: DC
  Administered 2022-02-04 (×2): 20 [IU] via SUBCUTANEOUS
  Filled 2022-02-03 (×3): qty 0.2

## 2022-02-03 MED ORDER — IPRATROPIUM-ALBUTEROL 0.5-2.5 (3) MG/3ML IN SOLN
3.0000 mL | Freq: Once | RESPIRATORY_TRACT | Status: AC
Start: 2022-02-03 — End: 2022-02-03
  Administered 2022-02-03: 3 mL via RESPIRATORY_TRACT
  Filled 2022-02-03: qty 3

## 2022-02-03 MED ORDER — SODIUM CHLORIDE 0.9 % IV SOLN
500.0000 mg | Freq: Once | INTRAVENOUS | Status: AC
  Administered 2022-02-03: 500 mg via INTRAVENOUS
  Filled 2022-02-03: qty 5

## 2022-02-03 MED ORDER — LACTATED RINGERS IV BOLUS (SEPSIS)
1500.0000 mL | Freq: Once | INTRAVENOUS | Status: AC
  Administered 2022-02-03: 1500 mL via INTRAVENOUS

## 2022-02-03 MED ORDER — SODIUM ZIRCONIUM CYCLOSILICATE 5 G PO PACK
5.0000 g | PACK | Freq: Once | ORAL | Status: DC
Start: 2022-02-03 — End: 2022-02-04
  Filled 2022-02-03: qty 1

## 2022-02-03 MED ORDER — GUAIFENESIN ER 600 MG PO TB12
600.0000 mg | ORAL_TABLET | Freq: Two times a day (BID) | ORAL | Status: DC
  Administered 2022-02-04 – 2022-02-05 (×4): 600 mg via ORAL
  Filled 2022-02-03 (×4): qty 1

## 2022-02-03 MED ORDER — SODIUM CHLORIDE 0.9 % IV SOLN
1.0000 g | Freq: Once | INTRAVENOUS | Status: AC
  Administered 2022-02-03: 1 g via INTRAVENOUS
  Filled 2022-02-03: qty 10

## 2022-02-03 MED ORDER — IPRATROPIUM-ALBUTEROL 0.5-2.5 (3) MG/3ML IN SOLN
3.0000 mL | Freq: Once | RESPIRATORY_TRACT | Status: AC
  Administered 2022-02-03: 3 mL via RESPIRATORY_TRACT
  Filled 2022-02-03: qty 3

## 2022-02-03 MED ORDER — IOHEXOL 350 MG/ML SOLN
75.0000 mL | Freq: Once | INTRAVENOUS | Status: AC | PRN
  Administered 2022-02-03: 75 mL via INTRAVENOUS

## 2022-02-03 MED ORDER — IPRATROPIUM-ALBUTEROL 0.5-2.5 (3) MG/3ML IN SOLN
3.0000 mL | Freq: Four times a day (QID) | RESPIRATORY_TRACT | Status: DC
  Administered 2022-02-04 (×2): 3 mL via RESPIRATORY_TRACT
  Filled 2022-02-03 (×2): qty 3

## 2022-02-03 MED ORDER — SIMVASTATIN 20 MG PO TABS
40.0000 mg | ORAL_TABLET | Freq: Every day | ORAL | Status: DC
  Administered 2022-02-04: 40 mg via ORAL
  Filled 2022-02-03 (×2): qty 2

## 2022-02-03 MED ORDER — SODIUM CHLORIDE 0.9 % IV SOLN: INTRAVENOUS | Status: DC

## 2022-02-03 MED ORDER — SODIUM CHLORIDE 0.9 % IV SOLN
500.0000 mg | INTRAVENOUS | Status: DC
  Administered 2022-02-04: 500 mg via INTRAVENOUS
  Filled 2022-02-03 (×2): qty 5

## 2022-02-03 MED ORDER — ACETAMINOPHEN 325 MG PO TABS: 650.0000 mg | ORAL_TABLET | Freq: Four times a day (QID) | ORAL | Status: DC | PRN

## 2022-02-03 NOTE — ED Triage Notes (Signed)
Patient to ED via [facility] from home for SOB. Patient wears 2L [place] at baseline- inially 80% on 2L.  In hospital in December with a hemoglobin of 5.3- been receiving iron infusions since. Received 350 NS with EMS. ?

## 2022-02-03 NOTE — Assessment & Plan Note (Signed)
Continue basal insulin.  Sliding scale insulin coverage 

## 2022-02-03 NOTE — Assessment & Plan Note (Signed)
Patient with tachycardia, tachypnea, soft blood pressures leukocytosis and pneumonia but with normal lactic acid and afebrile ?Initiate sepsis fluids and continue to monitor ?Follow procalcitonin ?

## 2022-02-03 NOTE — Assessment & Plan Note (Addendum)
DuoNebs as needed ?We will hold off on steroids due to pneumonia ?Followed by pulmonology.  Recent notes from Dr. Raul Del reviewed ?

## 2022-02-03 NOTE — Assessment & Plan Note (Addendum)
Receives IV Venofer through oncology-notes reviewed.  Continue ferrous sulfate ?

## 2022-02-03 NOTE — Assessment & Plan Note (Addendum)
Left lower lobe consolidation on CTA chest.  Emphysema with bronchial thickening ?Rocephin and azithromycin ?Antitussives ?Supplemental oxygen ?DuoNebs as needed ? ? ?

## 2022-02-03 NOTE — Progress Notes (Signed)
Anticoagulation monitoring(Lovenox): ? ?71 yo male ordered Lovenox 40 mg Q24h ?   ?Filed Weights  ? 02/03/22 1850  ?Weight: 95.3 kg (210 lb)  ? ?BMI 30.13  ? ?Lab Results  ?Component Value Date  ? CREATININE 1.48 (H) 02/03/2022  ? CREATININE 1.24 09/25/2021  ? CREATININE 1.24 09/24/2021  ? ?Estimated Creatinine Clearance: 54.6 mL/min (A) (by C-G formula based on SCr of 1.48 mg/dL (H)). ?Hemoglobin & Hematocrit  ?   ?Component Value Date/Time  ? HGB 11.3 (L) 02/03/2022 1852  ? HGB 9.8 (L) 12/25/2020 1058  ? HCT 35.8 (L) 02/03/2022 1852  ? HCT 29.5 (L) 12/25/2020 1058  ? ? ? ?Per Protocol for Patient with estCrcl > 30 ml/min and BMI > 30, will transition to Lovenox 47.5 mg Q24h.  ?  ? ? ?

## 2022-02-03 NOTE — ED Provider Notes (Signed)
? ?[facility] ?Provider Note ? ? ? Event Date/Time  ? First MD Initiated Contact with Patient 02/03/22 1937   ?  (approximate) ? ? ?History  ? ?Shortness of Breath ? ? ?HPI ? ?Jeremy [name] is a 71 y.o. male  who, per PCP office note dated 12/28/21 has history of HTN, HLD, anemia, DM, MS, who presents to the emergency department today because of concerns for shortness of breath.  Shortness of breath really started today.  He does have some chronic breathing difficulties and has oxygen at home and she will use as needed.  Yesterday the patient did not require any supplemental oxygen although felt off.  Today he did require it throughout the day.  The patient denies any significant chest pain associated with this.  He has have a productive cough. ? ?Physical Exam  ? ?Triage Vital Signs: ?ED Triage Vitals  ?Enc Vitals Group  ?   BP 02/03/22 1849 (!) 111/52  ?   Pulse Rate 02/03/22 1849 (!) 102  ?   Resp 02/03/22 1849 (!) 25  ?   Temp 02/03/22 1849 98.1 ?F (36.7 ?C)  ?   Temp Source 02/03/22 1849 Oral  ?   SpO2 02/03/22 1845 93 %  ?   Weight 02/03/22 1850 210 lb (95.3 kg)  ?   Height 02/03/22 1850 '5\' 10"'$  (1.778 m)  ?   Head Circumference --   ?   Peak Flow --   ?   Pain Score 02/03/22 1850 0  ? ?Most recent vital signs: ?Vitals:  ? 02/03/22 1845 02/03/22 1849  ?BP:  (!) 111/52  ?Pulse:  (!) 102  ?Resp:  (!) 25  ?Temp:  98.1 ?F (36.7 ?C)  ?SpO2: 93% 92%  ? ? ?General: Awake, no distress.  ?CV:  Good peripheral perfusion. Tachycardia, regular rate and rhythm. ?Resp:  Normal effort. Lungs clear to auscultation. Tachypnea. ?Abd:  No distention.  ? ? ? ?ED Results / Procedures / Treatments  ? ?Labs ?(all labs ordered are listed, but only abnormal results are displayed) ?Labs Reviewed  ?COMPREHENSIVE METABOLIC PANEL - Abnormal; Notable for the following components:  ?    Result Value  ? Potassium 5.3 (*)   ? CO2 20 (*)   ? Glucose, Bld 124 (*)   ? BUN 39 (*)   ? Creatinine, Ser 1.48 (*)   ? GFR, Estimated  51 (*)   ? All other components within normal limits  ?CBC WITH DIFFERENTIAL/PLATELET - Abnormal; Notable for the following components:  ? WBC 16.3 (*)   ? RBC 3.58 (*)   ? Hemoglobin 11.3 (*)   ? HCT 35.8 (*)   ? Neutro Abs 14.7 (*)   ? Lymphs Abs 0.6 (*)   ? All other components within normal limits  ?PROTIME-INR - Abnormal; Notable for the following components:  ? Prothrombin Time 16.1 (*)   ? INR 1.3 (*)   ? All other components within normal limits  ?CULTURE, BLOOD (ROUTINE X 2)  ?CULTURE, BLOOD (ROUTINE X 2)  ?LACTIC ACID, PLASMA  ?LACTIC ACID, PLASMA  ?URINALYSIS, ROUTINE W REFLEX MICROSCOPIC  ? ? ? ?EKG ? ?INance Pear, attending physician, personally viewed and interpreted this EKG ? ?EKG Time: 1855 ?Rate: 97 ?Rhythm: sinus rhythm with 1st degree AV block ?Axis: left axis deviation ?Intervals: qtc 474 ?QRS: narrow ?ST changes: no st elevation ?Impression: abnormal ekg ? ?RADIOLOGY ?I independently interpreted and visualized the cXR. My interpretation: no pneumonia. No pneumothorax ?Radiology  interpretation:  ?IMPRESSION:  ?1. Mild bronchitic changes without acute airspace disease  ?2. Stable right upper lobe pulmonary nodule, previously evaluated  ?with chest CT and PET CT.  ? ?CT angio pe ?IMPRESSION:  ?1. No pulmonary embolus.  ?2. Dense consolidation in the posterior left lower lobe typical of  ?pneumonia. Recommend radiographic follow-up with PA and lateral  ?views (consolidation is not well seen on AP view) after course of  ?treatment to document resolution.  ?3. Emphysema with bronchial thickening.  ?4. Hepatic cirrhosis.  ?   ?Aortic Atherosclerosis (ICD10-I70.0) and Emphysema (ICD10-J43.9).  ?   ?   ? ?PROCEDURES: ? ?Critical Care performed: No ? ?Procedures ? ? ?MEDICATIONS ORDERED IN ED: ?Medications - No data to display ? ? ?IMPRESSION / MDM / ASSESSMENT AND PLAN / ED COURSE  ?I reviewed the triage vital signs and the nursing notes. ?             ?               ? ?Differential diagnosis  includes, but is not limited to, pneumonia, pneumothorax, CHF cellulitis COPD, PE. ? ?Patient presented to the emergency department today because of concerns for shortness of breath.  Patient does have 2 L of oxygen at home to use as needed however required constant oxygen today.  Patient's blood work was notable for leukocytosis.  Chest x-ray did not show any pneumonia however given concern for that versus possible PE CT was obtained.  While this did not show any pulmonary embolism but was concerning for pneumonia.  Patient was started on IV antibiotics.  Discussed findings with patient.  Discussed with Dr. Damita Dunnings with the hospitalist service who will plan on admission. ? ? ? ?FINAL CLINICAL IMPRESSION(S) / ED DIAGNOSES  ? ?Final diagnoses:  ?Pneumonia due to infectious organism, unspecified laterality, unspecified part of lung  ?Shortness of breath  ? ? ? ?Note:  This document was prepared using Dragon voice recognition software and may include unintentional dictation errors. ? ?  ?Nance Pear, MD ?02/03/22 2158 ? ?

## 2022-02-03 NOTE — H&P (Signed)
?History and Physical  ? ? ?Patient: Jeremy [name] OBS:[id number] DOB: 19-Aug-1951 ?DOA: 02/03/2022 ?DOS: the patient was seen and examined on 02/03/2022 ?PCP: Juline Patch, MD  ?Patient coming from: Home ? ?Chief Complaint:  ?Chief Complaint  ?Patient presents with  ? Shortness of Breath  ? ? ?HPI: Jeremy [name] is a 71 y.o. male with medical history significant for DM, HTN, CKD 3A, multiple sclerosis, COPD on as needed O2 at 2 L, followed by pulmonology, chronic anemia on Venofer, hospitalized in December 2022 with symptomatic anemia of 5.3 requiring 3 units PRBC (EGD, colonoscopy and capsule endoscopy with polyp gastritis but no active bleeding) who presents to the ED with cough, and shortness of breath requiring continuous use of his .  He denied fever or chills or chest pain.  O2 sats reportedly 80% on 2 L on arrival of EMS. ?ED course: On arrival afebrile, BP 111/52, pulse 102 and respirations 25 with O2 sat 92% on 6 L.  Blood work significant for WBC 16,000, hemoglobin 11.3, lactic acid 1.1.  Creatinine 1.48, up from baseline of 1.24, potassium 5.3.  COVID and flu pending.  CTA chest showing the following findings: ?IMPRESSION: ?1. No pulmonary embolus. ?2. Dense consolidation in the posterior left lower lobe typical of ?pneumonia. Recommend radiographic follow-up with PA and lateral ?views (consolidation is not well seen on AP view) after course of ?treatment to document resolution. ?3. Emphysema with bronchial thickening. ?4. Hepatic cirrhosis ? ?Patient started on Rocephin and azithromycin.  Hospitalist consulted for admission.  ? ?Review of Systems: As mentioned in the history of present illness. All other systems reviewed and are negative. ?Past Medical History:  ?Diagnosis Date  ? Chronic pain   ? Depression   ? Diabetes ([place])   ? GERD (gastroesophageal reflux disease)   ? Hyperlipemia   ? Hypertension   ? MS (multiple sclerosis) ([place])   ? ?Past Surgical History:  ?Procedure Laterality Date  ? COLECTOMY   06-2008  ? COLONOSCOPY  2015  ? normal  ? COLONOSCOPY WITH PROPOFOL N/A 09/10/2021  ? Procedure: COLONOSCOPY WITH PROPOFOL;  Surgeon: [name], MD;  [facility] ENDOSCOPY;  Service: Endoscopy;  Laterality: N/A;  ? ESOPHAGOGASTRODUODENOSCOPY (EGD) WITH PROPOFOL N/A 09/10/2021  ? Procedure: ESOPHAGOGASTRODUODENOSCOPY (EGD) WITH PROPOFOL;  Surgeon: [name], MD;  Location[facility] ENDOSCOPY;  Service: Endoscopy;  Laterality: N/A;  ? GIVENS CAPSULE STUDY N/A 09/25/2021  ? Procedure: GIVENS CAPSULE STUDY;  Surgeon: Jonathon Bellows, MD;  Location: [facility] ENDOSCOPY;  Service: Gastroenterology;  Laterality: N/A;  ? ?Social History:  reports that he quit smoking about 16 years ago. His smoking use included cigarettes. He has a 45.00 pack-year smoking history. He has never used smokeless tobacco. He reports that he does not currently use alcohol. He reports that he does not use drugs. ? ?Allergies  ?Allergen Reactions  ? Betadine [Povidone Iodine]   ? ? ?Family History  ?Problem Relation Age of Onset  ? Lung cancer Mother   ? Heart attack Father   ? Heart attack Brother   ? COPD Brother   ? Diabetes Brother   ? Esophageal cancer Nephew   ? ? ?Prior to Admission medications   ?Medication Sig Start Date End Date Taking? Authorizing Provider  ?albuterol (VENTOLIN HFA) 108 (90 Base) MCG/ACT inhaler Inhale 2 puffs into the lungs in the morning, at noon, in the evening, and at bedtime.   Yes [provider]  ?aspirin 81 MG tablet Take 81 mg by mouth daily.  Yes [provider]  ?B-D INSULIN SYRINGE 1CC/25GX1" 25G X 1" 1 ML MISC USE AS DIRECTED 06/22/21  Yes Juline Patch, MD  ?ferrous sulfate 325 (65 FE) MG tablet Take 325 mg by mouth daily with breakfast.   Yes [provider]  ?furosemide (LASIX) 40 MG tablet Take 1 tablet (40 mg total) by mouth every other day. ?Patient taking differently: Take 20 mg by mouth every other day. 07/07/20  Yes Juline Patch, MD  ?gemfibrozil (LOPID) 600 MG tablet TAKE 1  TABLET(600 MG) BY MOUTH TWICE DAILY BEFORE A MEAL 12/28/21  Yes Juline Patch, MD  ?Insulin Glargine (LANTUS) 100 UNIT/ML Solostar Pen Inject 32 Units into the skin at bedtime.   Yes [provider]  ?Interferon Beta-1b (BETASERON) 0.3 MG KIT injection Inject subcutaneously 1 syringe every other day. 05/06/20  Yes Marcial Pacas, MD  ?loratadine (CLARITIN) 10 MG tablet Take 1 tablet (10 mg total) by mouth daily. 12/28/21  Yes Juline Patch, MD  ?losartan (COZAAR) 25 MG tablet Take 1 tablet (25 mg total) by mouth daily. singh 12/28/21  Yes Juline Patch, MD  ?metFORMIN (GLUCOPHAGE) 500 MG tablet Take 500 mg by mouth 2 (two) times daily.   Yes [provider]  ?omeprazole (PRILOSEC) 40 MG capsule TAKE 1 CAPSULE BY MOUTH EVERY DAY 12/28/21  Yes Juline Patch, MD  ?pregabalin (LYRICA) 200 MG capsule Take 1 capsule (200 mg total) by mouth 2 (two) times daily. 12/28/21  Yes Juline Patch, MD  ?sertraline (ZOLOFT) 50 MG tablet Take 1/2 tablet daily 12/28/21  Yes Juline Patch, MD  ?simvastatin (ZOCOR) 40 MG tablet Take 1 tablet (40 mg total) by mouth daily. 12/28/21  Yes Juline Patch, MD  ?sodium bicarbonate 650 MG tablet Take 650 mg by mouth 2 (two) times daily. 06/10/21  Yes [provider]  ?traMADol (ULTRAM) 50 MG tablet Take 1 tablet (50 mg total) by mouth every 6 (six) hours as needed. 07/15/21  Yes Marcial Pacas, MD  ? ? ?Physical Exam: ?Vitals:  ? 02/03/22 1845 02/03/22 1849 02/03/22 1850 02/03/22 1955  ?BP:  (!) 111/52  (!) 116/52  ?Pulse:  (!) 102  97  ?Resp:  (!) 25  15  ?Temp:  98.1 ?F (36.7 ?C)    ?TempSrc:  Oral    ?SpO2: 93% 92%  98%  ?Weight:   95.3 kg   ?Height:   5' 10"  (1.778 m)   ? ?Physical Exam ?Vitals and nursing note reviewed.  ?Constitutional:   ?   Jeremy: He is not in acute distress. ?HENT:  ?   Head: Normocephalic and atraumatic.  ?Cardiovascular:  ?   Rate and Rhythm: Regular rhythm. Tachycardia present.  ?   Pulses: Normal pulses.  ?   Heart sounds: Normal heart sounds.   ?Pulmonary:  ?   Effort: Tachypnea present.  ?   Breath sounds: Examination of the left-lower field reveals decreased breath sounds. Decreased breath sounds and wheezing present.  ?Abdominal:  ?   Palpations: Abdomen is soft.  ?   Tenderness: There is no abdominal tenderness.  ?Neurological:  ?   Mental Status: Mental status is at baseline.  ? ? ? ?Data Reviewed: ?Relevant notes from primary care and specialist visits, past discharge summaries as available in EHR, including Care Everywhere. ?Prior diagnostic testing as pertinent to current admission diagnoses ?Updated medications and problem lists for reconciliation ?ED course, including vitals, labs, imaging, treatment and response to treatment ?Triage notes,  nursing and pharmacy notes and ED provider's notes ?Notable results as noted in HPI ? ? ?Assessment and Plan: ?* Pneumonia ?Left lower lobe consolidation on CTA chest.  Emphysema with bronchial thickening ?Rocephin and azithromycin ?Antitussives ?Supplemental oxygen ?DuoNebs as needed ? ? ? ?SIRS (systemic inflammatory response syndrome), possible sepsis ([place]) ?Patient with tachycardia, tachypnea, soft blood pressures leukocytosis and pneumonia but with normal lactic acid and afebrile ?Initiate sepsis fluids and continue to monitor ?Follow procalcitonin ? ?COPD with acute exacerbation ([place]) ?DuoNebs as needed ?We will hold off on steroids due to pneumonia ?Followed by pulmonology.  Recent notes from Dr. Raul Del reviewed ? ?Acute on chronic respiratory failure with hypoxia (HCC) ?Patient uses as needed oxygen at 2 L at baseline, was 80% on 2 L with EMS requiring up to 5 L in the ED to maintain sats in the mid 90s ?Continue O2 to keep sats over 92% and wean as tolerated ? ?Hyperkalemia ?One-time Lokelma and expecting improvement with hydration ?Continue home sodium bicarb tablets ? ?Anemia in stage 3a chronic kidney disease ([place]) ?Receives IV Venofer through oncology-notes reviewed.  Continue ferrous  sulfate ? ?Stage 3a chronic kidney disease ([place]) ?Followed by nephrology.  Renal function near baseline ? ?Type 2 diabetes mellitus with diabetic polyneuropathy, with long-term current use of insulin ([place]) ?Cornelius Moras

## 2022-02-03 NOTE — Assessment & Plan Note (Signed)
Continue Zoloft 

## 2022-02-03 NOTE — Assessment & Plan Note (Addendum)
One-time Lokelma and expecting improvement with hydration ?Continue home sodium bicarb tablets ?

## 2022-02-03 NOTE — Assessment & Plan Note (Signed)
Creatinine 1.48, up from baseline of 1.2 ?

## 2022-02-03 NOTE — ED Notes (Signed)
Rainbow and 1 set of blood cultures sent to lab at this time. ?

## 2022-02-03 NOTE — Assessment & Plan Note (Signed)
Will hold home losartan and furosemide due to soft blood pressure ?

## 2022-02-03 NOTE — Assessment & Plan Note (Signed)
Patient uses as needed oxygen at 2 L at baseline, was 80% on 2 L with EMS requiring up to 5 L in the ED to maintain sats in the mid 90s ?Continue O2 to keep sats over 92% and wean as tolerated ?

## 2022-02-03 NOTE — Assessment & Plan Note (Signed)
Followed by nephrology.  Renal function near baseline ?

## 2022-02-03 NOTE — ED Notes (Signed)
Pt to ED for SOB and weakness. Per wife, pt was not acting like himself this AM. States he usually gets around with walker at home, and was not able to ambulate by himself. Pt denies current SOB. Usually wears O2 PRN at home, pt currently at 6L.  ?Denies CP, Abdominal pain, and urinary symptoms.  ? ?Pt is A&Ox4 At this time  ?

## 2022-02-03 NOTE — ED Notes (Signed)
Patient transported to CT 

## 2022-02-03 NOTE — ED Notes (Signed)
Dr. Damita Dunnings  notified about pt BP, See [facility] for new orders.  ?

## 2022-02-03 NOTE — Assessment & Plan Note (Signed)
On Betaseron ?Acute flare not suspected at this time ?

## 2022-02-04 ENCOUNTER — Encounter: Payer: Self-pay | Admitting: Internal Medicine

## 2022-02-04 DIAGNOSIS — A419 Sepsis, unspecified organism: Secondary | ICD-10-CM | POA: Diagnosis not present

## 2022-02-04 DIAGNOSIS — J9601 Acute respiratory failure with hypoxia: Secondary | ICD-10-CM

## 2022-02-04 DIAGNOSIS — J441 Chronic obstructive pulmonary disease with (acute) exacerbation: Secondary | ICD-10-CM

## 2022-02-04 DIAGNOSIS — J9621 Acute and chronic respiratory failure with hypoxia: Secondary | ICD-10-CM

## 2022-02-04 DIAGNOSIS — J189 Pneumonia, unspecified organism: Secondary | ICD-10-CM | POA: Diagnosis not present

## 2022-02-04 DIAGNOSIS — R6520 Severe sepsis without septic shock: Secondary | ICD-10-CM

## 2022-02-04 LAB — BASIC METABOLIC PANEL
Anion gap: 6 (ref 5–15)
Anion gap: 7 (ref 5–15)
BUN: 47 mg/dL — ABNORMAL HIGH (ref 8–23)
BUN: 50 mg/dL — ABNORMAL HIGH (ref 8–23)
CO2: 20 mmol/L — ABNORMAL LOW (ref 22–32)
CO2: 18 mmol/L — ABNORMAL LOW (ref 22–32)
Calcium: 8.1 mg/dL — ABNORMAL LOW (ref 8.9–10.3)
Calcium: 7.9 mg/dL — ABNORMAL LOW (ref 8.9–10.3)
Chloride: 108 mmol/L (ref 98–111)
Chloride: 106 mmol/L (ref 98–111)
Creatinine, Ser: 1.59 mg/dL — ABNORMAL HIGH (ref 0.61–1.24)
Creatinine, Ser: 1.68 mg/dL — ABNORMAL HIGH (ref 0.61–1.24)
GFR, Estimated: 47 mL/min — ABNORMAL LOW (ref >60)
GFR, Estimated: 44 mL/min — ABNORMAL LOW (ref >60)
Glucose, Bld: 276 mg/dL — ABNORMAL HIGH (ref 70–99)
Glucose, Bld: 345 mg/dL — ABNORMAL HIGH (ref 70–99)
Potassium: 4.7 mmol/L (ref 3.5–5.1)
Potassium: 4.7 mmol/L (ref 3.5–5.1)
Sodium: 134 mmol/L — ABNORMAL LOW (ref 135–145)
Sodium: 131 mmol/L — ABNORMAL LOW (ref 135–145)

## 2022-02-04 LAB — GLUCOSE, CAPILLARY
Glucose-Capillary: 244 mg/dL — ABNORMAL HIGH (ref 70–99)
Glucose-Capillary: 315 mg/dL — ABNORMAL HIGH (ref 70–99)
Glucose-Capillary: 192 mg/dL — ABNORMAL HIGH (ref 70–99)
Glucose-Capillary: 173 mg/dL — ABNORMAL HIGH (ref 70–99)

## 2022-02-04 LAB — CBC
HCT: 26.7 % — ABNORMAL LOW (ref 39.0–52.0)
Hemoglobin: 8.6 g/dL — ABNORMAL LOW (ref 13.0–17.0)
MCH: 32 pg (ref 26.0–34.0)
MCHC: 32.2 g/dL (ref 30.0–36.0)
MCV: 99.3 fL (ref 80.0–100.0)
Platelets: 143 10*3/uL — ABNORMAL LOW (ref 150–400)
RBC: 2.69 MIL/uL — ABNORMAL LOW (ref 4.22–5.81)
RDW: 14.6 % (ref 11.5–15.5)
WBC: 10.6 10*3/uL — ABNORMAL HIGH (ref 4.0–10.5)
nRBC: 0 % (ref 0.0–0.2)

## 2022-02-04 MED ORDER — IPRATROPIUM-ALBUTEROL 0.5-2.5 (3) MG/3ML IN SOLN
3.0000 mL | Freq: Three times a day (TID) | RESPIRATORY_TRACT | Status: DC
  Administered 2022-02-04: 3 mL via RESPIRATORY_TRACT
  Filled 2022-02-04: qty 3

## 2022-02-04 MED ORDER — IPRATROPIUM-ALBUTEROL 0.5-2.5 (3) MG/3ML IN SOLN
3.0000 mL | Freq: Two times a day (BID) | RESPIRATORY_TRACT | Status: DC
  Administered 2022-02-05: 3 mL via RESPIRATORY_TRACT
  Filled 2022-02-04: qty 3

## 2022-02-04 MED ORDER — LACTATED RINGERS IV BOLUS
500.0000 mL | Freq: Once | INTRAVENOUS | Status: AC
  Administered 2022-02-04: 500 mL via INTRAVENOUS

## 2022-02-04 MED ORDER — LACTATED RINGERS IV SOLN: INTRAVENOUS | Status: DC

## 2022-02-04 NOTE — Progress Notes (Signed)
Continue to monitor Vitals signs BP and MAP. ?

## 2022-02-04 NOTE — Progress Notes (Signed)
Patient arrives unit at [facility]. Vital signs stable. Wife is present. States that some of his medications were administered already. States that she gave them to him prior to arrival in the unit. See notes attached to medications on MAR. ? ?I administered all other medications that she states she did not give. ? ? ?

## 2022-02-04 NOTE — TOC Initial Note (Signed)
Transition of Care (TOC) - Initial/Assessment Note  ? ? ?Patient Details  ?Name: Jeremy [name] ?MRN: [id number] ?Date of Birth: July 19, 1951 ? ?Transition of Care (TOC) CM/SW Contact:    ?Candie Chroman, LCSW ?Phone Number: ?02/04/2022, 9:48 AM ? ?Clinical Narrative:  Readmission prevention screen complete. CSW met with patient. Wife at bedside. CSW introduced role and explained that discharge planning would be discussed. PCP is Otilio Miu, MD. Patient sometimes drives to his appointments but typically his wife drives. Pharmacy is [facility] but for anything new, would like prescriptions sent to [facility] in [place]. No issues obtaining medications. No home health prior to admission. Patient uses a rollator to get around at home. He also has a shower chair. Is on 2 L prn oxygen at home but they cannot remember the name of the company. He is currently on 5 L. No further concerns. CSW encouraged patient and his wife to contact CSW as needed. CSW will continue to follow patient and his wife for support and facilitate return home when stable.               ? ?Expected Discharge Plan: Home/Self Care ?Barriers to Discharge: Continued Medical Work up ? ? ?Patient Goals and CMS Choice ?  ?  ?  ? ?Expected Discharge Plan and Services ?Expected Discharge Plan: Home/Self Care ?  ?  ?Post Acute Care Choice: NA ?Living arrangements for the past 2 months: [place] ?                ?  ?  ?  ?  ?  ?  ?  ?  ?  ?  ? ?Prior Living Arrangements/Services ?Living arrangements for the past 2 months: [place] ?Lives with:: Spouse ?Patient language and need for interpreter reviewed:: Yes ?Do you feel safe going back to the place where you live?: Yes      ?Need for Family Participation in Patient Care: Yes (Comment) ?Care giver support system in place?: Yes (comment) ?Current home services: DME ?Criminal Activity/Legal Involvement Pertinent to Current Situation/Hospitalization: No - Comment as needed ? ?Activities of Daily  Living ?Home Assistive Devices/Equipment: Gilford Rile (specify type) (Four wheel) ?ADL Screening (condition at time of admission) ?Patient's cognitive ability adequate to safely complete daily activities?: Yes ?Is the patient deaf or have difficulty hearing?: Yes ?Does the patient have difficulty seeing, even when wearing glasses/contacts?: No ?Does the patient have difficulty concentrating, remembering, or making decisions?: No ?Patient able to express need for assistance with ADLs?: Yes ?Does the patient have difficulty dressing or bathing?: No ?Independently performs ADLs?: Yes (appropriate for developmental age) ?Does the patient have difficulty walking or climbing stairs?: No ?Weakness of Legs: None ?Weakness of Arms/Hands: None ? ?Permission Sought/Granted ?Permission sought to share information with : Customer service manager ?  ? Share Information with NAME: Randee Upchurch ?   ? Permission granted to share info w Relationship: Wife ? Permission granted to share info w Contact Information: [phone] ? ?Emotional Assessment ?Appearance:: Appears stated age ?Attitude/Demeanor/Rapport: Engaged, Gracious ?Affect (typically observed): Accepting, Appropriate, Calm, Pleasant ?Orientation: : Oriented to Self, Oriented to Place, Oriented to  Time, Oriented to Situation ?Alcohol / Substance Use: Not Applicable ?Psych Involvement: No (comment) ? ?Admission diagnosis:  Shortness of breath [R06.02] ?Pneumonia [J18.9] ?Pneumonia due to infectious organism, unspecified laterality, unspecified part of lung [J18.9] ?Patient Active Problem List  ? Diagnosis Date Noted  ? Pneumonia 02/03/2022  ? Acute on chronic respiratory failure with hypoxia ([place]) 02/03/2022  ?  Hyperkalemia 02/03/2022  ? COPD with acute exacerbation ([place]) 02/03/2022  ? SIRS (systemic inflammatory response syndrome), possible sepsis ([place]) 02/03/2022  ? Symptomatic anemia 09/24/2021  ? Gastric polyp   ? Gastritis without bleeding   ? Anemia in stage 3a  chronic kidney disease ([place]) 06/19/2021  ? Iron deficiency anemia due to chronic blood loss 06/19/2021  ? Gait abnormality 05/07/2021  ? Stage 3a chronic kidney disease ([place]) 05/20/2020  ? DM type 2 with diabetic peripheral neuropathy ([place]) 05/06/2020  ? Idiopathic peripheral neuropathy 01/15/2020  ? Generalized edema 01/15/2020  ? Primary pulmonary hypertension ([place]) 10/28/2019  ? Therapeutic drug monitoring 03/23/2018  ? Mild episode of recurrent major depressive disorder ([place]) 09/26/2017  ? Ventricular ectopic beats 09/26/2017  ? Type 2 diabetes mellitus with diabetic polyneuropathy, with long-term current use of insulin ([place]) 08/12/2017  ? Hyperlipidemia due to type 2 diabetes mellitus ([place]) 08/12/2017  ? B12 deficiency 12/14/2016  ? Low back pain 03/10/2016  ? Lumbosacral disc disease 01/01/2016  ? Neuropathy associated with endocrine disorder ([place]) 11/19/2015  ? B12 deficiency anemia 06/03/2015  ? Essential hypertension 06/03/2015  ? Hyperlipidemia 06/03/2015  ? Depression 06/03/2015  ? Gastroesophageal reflux disease without esophagitis 06/03/2015  ? Edema extremities 06/03/2015  ? Multiple sclerosis ([place]) 07/26/2013  ? Abnormality of gait 07/26/2013  ? Morbid obesity ([place]) 07/26/2013  ? ?PCP:  Juline Patch, MD ?Pharmacy:   ?[facility]) - [place], [address] ?[address] ?[address] ?[place] [address] ?Phone: [phone] Fax: [phone] ? ?[place] [place] [address] ?[place] [address] ?Phone: [phone] Fax: [phone] ? ?[facility] [place], [place] - [address] ?[phone] [address] ?[place] [address] ?Phone: [phone] Fax: [phone] ? ?[place] ([facility] ) - [place], [place] ?[address] ?[place] [address] ?Phone: [phone]  Fax: [phone] ? ? ? ? ?Social Determinants of Health (SDOH) Interventions ?  ? ?Readmission Risk Interventions ? ?  02/04/2022  ?  9:47 AM  ?Readmission Risk Prevention Plan  ?Transportation Screening Complete  ?PCP or Specialist Appt within 3-5 Days Complete  ?Social Work Consult for [place] Planning/Counseling Complete  ?Palliative Care Screening Not Applicable  ?Medication Review Press photographer) Complete  ? ? ? ?

## 2022-02-04 NOTE — Progress Notes (Signed)
Renewed cardiac monitoring ?

## 2022-02-04 NOTE — Progress Notes (Signed)
Patient complains of pain in Left Ante-Cubital PIV. There is some resistance when flushed. Slight drainage on dressing. Patient states it hurt earlier. ? ?IV removed. ?

## 2022-02-04 NOTE — Care Management Important Message (Signed)
Important Message ? ?Patient Details  ?Name: Jeremy [name] ?MRN: [id number] ?Date of Birth: Jul 15, 1951 ? ? ?Medicare Important Message Given:  N/A - LOS <3 / Initial given by admissions ? ? ? ? ?Dannette Barbara ?02/04/2022, 6:21 PM ?

## 2022-02-04 NOTE — Progress Notes (Addendum)
? ? ? ?Progress Note  ? ? ?Jeremy [name]  NLZ:[id number] DOB: 22-Sep-1951  DOA: 02/03/2022 ?PCP: Juline Patch, MD  ? ? ? ? ?Brief Narrative:  ? ? ?Medical records reviewed and are as summarized below: ? ?Jeremy [name] is a 71 y.o. male with medical history significant for DM, HTN, CKD 3A, multiple sclerosis, COPD on as needed O2 at 2 L, followed by pulmonology, chronic anemia on Venofer, hospitalized in December 2022 with symptomatic anemia of 5.3 requiring 3 units PRBC (EGD, colonoscopy and capsule endoscopy with polyp gastritis but no active bleeding).  He presented to the hospital with cough, shortness of breath.  Oxygen saturation was reportedly around 80% on 2 L of oxygen when EMS arrived. ? ?He was admitted to the hospital for severe sepsis secondary to community-acquired pneumonia, COPD exacerbation, acute hypoxic respiratory failure. ? ? ?Assessment/Plan:  ? ?Principal Problem: ?  Severe sepsis ([place]) ?Active Problems: ?  Pneumonia ?  Acute on chronic respiratory failure with hypoxia (HCC) ?  COPD with acute exacerbation ([place]) ?  SIRS (systemic inflammatory response syndrome), possible sepsis ([place]) ?  Hyperkalemia ?  Multiple sclerosis ([place]) ?  Essential hypertension ?  Depression ?  Type 2 diabetes mellitus with diabetic polyneuropathy, with long-term current use of insulin ([place]) ?  Stage 3a chronic kidney disease ([place]) ?  Anemia in stage 3a chronic kidney disease ([place]) ? ? ? ?Body mass index is 30.13 kg/m?.  (Obesity) ? ? ?Severe sepsis secondary to community-acquired pneumonia: Continue empiric IV antibiotics.  Follow-up blood cultures. ? ?Acute on chronic hypoxic respiratory failure.  He is on 5 L/min oxygen.  Taper off oxygen as able.  He uses 2 L/min oxygen as needed at home. ? ?COPD exacerbation: Continue bronchodilators. ? ?Hypotension: He was given additional bolus of Ringer's lactate this morning.  Continue maintenance IV fluids. ? ?Hyperkalemia: Improved ? ?Hyponatremia: This is likely  hyperglycemia induced.  Monitor BMP. ? ?Type II DM with hyperglycemia: Continue insulin glargine and NovoLog as needed. ? ?Multiple sclerosis: On Betaseron as an outpatient. ? ?Other comorbidities include depression, hypertension, CKD stage IIIa, anemia of chronic disease ? ? ?Diet Order   ? ?       ?  Diet heart healthy/carb modified Room service appropriate? Yes; Fluid consistency: Thin  Diet effective now       ?  ? ?  ?  ? ?  ? ? ? ? ? ? ? ? ? ? ? ? ?Consultants: ?None ? ?Procedures: ?None ? ? ? ?Medications:  ? ? aspirin EC  81 mg Oral Daily  ? enoxaparin (LOVENOX) injection  0.5 mg/kg Subcutaneous Q24H  ? ferrous sulfate  325 mg Oral Q breakfast  ? guaiFENesin  600 mg Oral BID  ? insulin aspart  0-15 Units Subcutaneous TID WC  ? insulin aspart  0-5 Units Subcutaneous QHS  ? insulin glargine-yfgn  20 Units Subcutaneous QHS  ? ipratropium-albuterol  3 mL Nebulization TID  ? pregabalin  200 mg Oral BID  ? sertraline  25 mg Oral Daily  ? simvastatin  40 mg Oral Daily  ? sodium bicarbonate  650 mg Oral BID  ? ?Continuous Infusions: ? azithromycin    ? cefTRIAXone (ROCEPHIN)  IV    ? lactated ringers Stopped (02/04/22 0005)  ? ? ? ?Anti-infectives (From admission, onward)  ? ? Start     Dose/Rate Route Frequency Ordered Stop  ? 02/04/22 2200  azithromycin (ZITHROMAX) 500 mg in sodium chloride 0.9 % 250 mL  IVPB       ? 500 mg ?250 mL/hr over 60 Minutes Intravenous Every 24 hours 02/03/22 2207 02/08/22 2159  ? 02/04/22 2100  cefTRIAXone (ROCEPHIN) 2 g in sodium chloride 0.9 % 100 mL IVPB       ? 2 g ?200 mL/hr over 30 Minutes Intravenous Every 24 hours 02/03/22 2207 02/08/22 2059  ? 02/03/22 2115  cefTRIAXone (ROCEPHIN) 1 g in sodium chloride 0.9 % 100 mL IVPB       ? 1 g ?200 mL/hr over 30 Minutes Intravenous  Once 02/03/22 2111 02/03/22 2157  ? 02/03/22 2115  azithromycin (ZITHROMAX) 500 mg in sodium chloride 0.9 % 250 mL IVPB       ? 500 mg ?250 mL/hr over 60 Minutes Intravenous  Once 02/03/22 2111 02/03/22 2326   ? ?  ? ? ? ? ? ? ? ? ? ?Family Communication/Anticipated D/C date and plan/Code Status  ? ?DVT prophylaxis:  ? ? ?  Code Status: Full Code ? ?Family Communication: Plan discussed with his wife at the bedside ?Disposition Plan: Plan to discharge home in 2 to 3 days ? ? ?Status is: Inpatient ?Remains inpatient appropriate because: IV antibiotics ? ? ? ? ? ? ?Subjective:  ? ? ?Interval events noted.  His wife was at the bedside.  He complains of generalized weakness. ? ?Objective:  ? ? ?Vitals:  ? 02/04/22 0406 02/04/22 0427 02/04/22 0743 02/04/22 1126  ?BP: (!) 88/36 (!) 92/39 (!) 98/41 (!) 144/57  ?Pulse: 78 76 68 93  ?Resp: '18 17 17 19  '$ ?Temp: 97.9 ?F (36.6 ?C) 97.6 ?F (36.4 ?C) 98.2 ?F (36.8 ?C) 98.1 ?F (36.7 ?C)  ?TempSrc:  Oral    ?SpO2: 93% 93% 94% 97%  ?Weight:      ?Height:      ? ?No data found. ? ? ?Intake/Output Summary (Last 24 hours) at 02/04/2022 1245 ?Last data filed at 02/04/2022 1122 ?Gross per 24 hour  ?Intake 1305.73 ml  ?Output 600 ml  ?Net 705.73 ml  ? ?Filed Weights  ? 02/03/22 1850  ?Weight: 95.3 kg  ? ? ?Exam: ? ?GEN: NAD ?SKIN: No rash ?EYES: EOMI ?ENT: MMM ?CV: RRR ?PULM: Decreased breath sounds bilaterally.  No wheezing or rales heard ?ABD: soft, ND, NT, +BS ?CNS: AAO x 3, non focal ?EXT: No edema or tenderness ? ? ? ?  ? ? ?Data Reviewed:  ? ?I have personally reviewed following labs and imaging studies: ? ?Labs: ?Labs show the following:  ? ?Basic Metabolic Panel: ?Recent Labs  ?Lab 02/03/22 ?1852 02/04/22 ?0610  ?NA 137 134*  ?K 5.3* 4.7  ?CL 108 108  ?CO2 20* 20*  ?GLUCOSE 124* 276*  ?BUN 39* 47*  ?CREATININE 1.48* 1.59*  ?CALCIUM 8.9 8.1*  ? ?GFR ?Estimated Creatinine Clearance: 50.8 mL/min (A) (by C-G formula based on SCr of 1.59 mg/dL (H)). ?Liver Function Tests: ?Recent Labs  ?Lab 02/03/22 ?1505  ?AST 18  ?ALT 10  ?ALKPHOS 79  ?BILITOT 0.8  ?PROT 7.9  ?ALBUMIN 3.5  ? ?No results for input(s): LIPASE, AMYLASE in the last 168 hours. ?No results for input(s): AMMONIA in the last 168  hours. ?Coagulation profile ?Recent Labs  ?Lab 02/03/22 ?6979  ?INR 1.3*  ? ? ?CBC: ?Recent Labs  ?Lab 02/03/22 ?1852 02/04/22 ?0610  ?WBC 16.3* 10.6*  ?NEUTROABS 14.7*  --   ?HGB 11.3* 8.6*  ?HCT 35.8* 26.7*  ?MCV 100.0 99.3  ?PLT 175 143*  ? ?Cardiac Enzymes: ?No results for  input(s): CKTOTAL, CKMB, CKMBINDEX, TROPONINI in the last 168 hours. ?BNP (last 3 results) ?No results for input(s): PROBNP in the last 8760 hours. ?CBG: ?Recent Labs  ?Lab 02/03/22 ?2321 02/04/22 ?0741 02/04/22 ?1126  ?GLUCAP 141* 244* 315*  ? ?D-Dimer: ?No results for input(s): DDIMER in the last 72 hours. ?Hgb A1c: ?No results for input(s): HGBA1C in the last 72 hours. ?Lipid Profile: ?No results for input(s): CHOL, HDL, LDLCALC, TRIG, CHOLHDL, LDLDIRECT in the last 72 hours. ?Thyroid function studies: ?No results for input(s): TSH, T4TOTAL, T3FREE, THYROIDAB in the last 72 hours. ? ?Invalid input(s): FREET3 ?Anemia work up: ?No results for input(s): VITAMINB12, FOLATE, FERRITIN, TIBC, IRON, RETICCTPCT in the last 72 hours. ?Sepsis Labs: ?Recent Labs  ?Lab 02/03/22 ?1852 02/04/22 ?0610  ?WBC 16.3* 10.6*  ?LATICACIDVEN 1.1  --   ? ? ?Microbiology ?Recent Results (from the past 240 hour(s))  ?Resp Panel by RT-PCR (Flu A&B, Covid) Nasopharyngeal Swab     Status: None  ? Collection Time: 02/03/22  8:00 PM  ? Specimen: Nasopharyngeal Swab; Nasopharyngeal(NP) swabs in vial transport medium  ?Result Value Ref Range Status  ? SARS Coronavirus 2 by RT PCR NEGATIVE NEGATIVE Final  ?  Comment: (NOTE) ?SARS-CoV-2 target nucleic acids are NOT DETECTED. ? ?The SARS-CoV-2 RNA is generally detectable in upper respiratory ?specimens during the acute phase of infection. The lowest ?concentration of SARS-CoV-2 viral copies this assay can detect is ?138 copies/mL. A negative result does not preclude SARS-Cov-2 ?infection and should not be used as the sole basis for treatment or ?other patient management decisions. A negative result may occur with  ?improper  specimen collection/handling, submission of specimen other ?than nasopharyngeal swab, presence of viral mutation(s) within the ?areas targeted by this assay, and inadequate number of viral ?copies(<138 copies/mL). A nega

## 2022-02-04 NOTE — Progress Notes (Signed)
Patient's vitals at 4:27 am. Will inform NP Sharion Settler of BP and MAP ? ? 02/04/22 0427  ?Vitals  ?Temp 97.6 ?F (36.4 ?C)  ?Temp Source Oral  ?BP (!) 92/39  ?MAP (mmHg) (!) 56  ?BP Location Left Arm  ?BP Method Automatic  ?Patient Position (if appropriate) Lying  ?Pulse Rate 76  ?Resp 17  ?MEWS COLOR  ?MEWS Score Color Green  ?Oxygen Therapy  ?SpO2 93 %  ?O2 Device Nasal Cannula  ?O2 Flow Rate (L/min) 5 L/min  ?MEWS Score  ?MEWS Temp 0  ?MEWS Systolic 1  ?MEWS Pulse 0  ?MEWS RR 0  ?MEWS LOC 0  ?MEWS Score 1  ? ? ?

## 2022-02-04 NOTE — Plan of Care (Signed)
Pneumonia Care Plan Created. Patient and wife will be educated this admission ?

## 2022-02-05 ENCOUNTER — Telehealth: Payer: Self-pay

## 2022-02-05 DIAGNOSIS — J9621 Acute and chronic respiratory failure with hypoxia: Secondary | ICD-10-CM | POA: Diagnosis not present

## 2022-02-05 DIAGNOSIS — G35 Multiple sclerosis: Secondary | ICD-10-CM

## 2022-02-05 DIAGNOSIS — J441 Chronic obstructive pulmonary disease with (acute) exacerbation: Secondary | ICD-10-CM | POA: Diagnosis not present

## 2022-02-05 DIAGNOSIS — J189 Pneumonia, unspecified organism: Secondary | ICD-10-CM | POA: Diagnosis not present

## 2022-02-05 DIAGNOSIS — A419 Sepsis, unspecified organism: Secondary | ICD-10-CM | POA: Diagnosis not present

## 2022-02-05 LAB — CBC WITH DIFFERENTIAL/PLATELET
Abs Immature Granulocytes: 0.04 10*3/uL (ref 0.00–0.07)
Basophils Absolute: 0 10*3/uL (ref 0.0–0.1)
Basophils Relative: 0 %
Eosinophils Absolute: 0 10*3/uL (ref 0.0–0.5)
Eosinophils Relative: 1 %
HCT: 25.6 % — ABNORMAL LOW (ref 39.0–52.0)
Hemoglobin: 8.2 g/dL — ABNORMAL LOW (ref 13.0–17.0)
Immature Granulocytes: 1 %
Lymphocytes Relative: 11 %
Lymphs Abs: 0.9 10*3/uL (ref 0.7–4.0)
MCH: 31.8 pg (ref 26.0–34.0)
MCHC: 32 g/dL (ref 30.0–36.0)
MCV: 99.2 fL (ref 80.0–100.0)
Monocytes Absolute: 0.6 10*3/uL (ref 0.1–1.0)
Monocytes Relative: 7 %
Neutro Abs: 6.5 10*3/uL (ref 1.7–7.7)
Neutrophils Relative %: 80 %
Platelets: 143 10*3/uL — ABNORMAL LOW (ref 150–400)
RBC: 2.58 MIL/uL — ABNORMAL LOW (ref 4.22–5.81)
RDW: 14.2 % (ref 11.5–15.5)
WBC: 8.1 10*3/uL (ref 4.0–10.5)
nRBC: 0 % (ref 0.0–0.2)

## 2022-02-05 LAB — GLUCOSE, CAPILLARY
Glucose-Capillary: 85 mg/dL (ref 70–99)
Glucose-Capillary: 132 mg/dL — ABNORMAL HIGH (ref 70–99)

## 2022-02-05 MED ORDER — FUROSEMIDE 40 MG PO TABS: 20.0000 mg | ORAL_TABLET | ORAL | Status: DC

## 2022-02-05 MED ORDER — AZITHROMYCIN 500 MG PO TABS: 500.0000 mg | ORAL_TABLET | Freq: Every evening | ORAL | 0 refills | Status: AC

## 2022-02-05 MED ORDER — AMOXICILLIN-POT CLAVULANATE 875-125 MG PO TABS: 1.0000 | ORAL_TABLET | Freq: Two times a day (BID) | ORAL | 0 refills | Status: AC

## 2022-02-05 NOTE — Progress Notes (Signed)
SATURATION QUALIFICATIONS: (This note is used to comply with regulatory documentation for home oxygen) ? ?Patient Saturations on Room Air at Rest = 88% ? ?Patient Saturations on Room Air while Ambulating = 82% ? ?Patient Saturations on 2 Liters of oxygen while Ambulating = 90% ? ?Please briefly explain why patient needs home oxygen: ?

## 2022-02-05 NOTE — Progress Notes (Signed)
Patient to discharge today home, has O2 at home however they do not know name of agency. To discharge home wife reports she will bring home O2 and transport patient home via private vehicle.  ? ?No further dc needs identified.  ? Pricilla Riffle, [place] ?[phone] ? ?

## 2022-02-05 NOTE — Discharge Summary (Signed)
?Physician Discharge Summary ?  ?Patient: Jeremy [name] MRN: [id number] DOB: 11-06-1950  ?Admit date:     02/03/2022  ?Discharge date: 02/05/22  ?Discharge Physician: Jeremy [name]  ? ?PCP: Jeremy Patch, MD  ? ?Recommendations at discharge:  ? ? ?Follow-up with PCP in 1 week. ?Follow-up with Dr. Allen [name], gastroenterologist, for liver cirrhosis ? ?Discharge Diagnoses: ?Principal Problem: ?  Severe sepsis ([place]) ?Active Problems: ?  Pneumonia ?  Acute on chronic respiratory failure with hypoxia (HCC) ?  COPD with acute exacerbation ([place]) ?  SIRS (systemic inflammatory response syndrome), possible sepsis ([place]) ?  Hyperkalemia ?  Multiple sclerosis ([place]) ?  Essential hypertension ?  Depression ?  Type 2 diabetes mellitus with diabetic polyneuropathy, with long-term current use of insulin ([place]) ?  Stage 3a chronic kidney disease ([place]) ?  Anemia in stage 3a chronic kidney disease ([place]) ? ?Resolved Problems: ?  * No resolved hospital problems. * ? ?Hospital Course: ? ?Jeremy [name] is a 71 y.o. male with medical history significant for DM, HTN, CKD 3A, multiple sclerosis, COPD, chronic hypoxic respiratory failure on 2 L oxygen as needed, liver cirrhosis, chronic anemia on Venofer, hospitalized in December 2022 with symptomatic anemia of 5.3 requiring 3 units PRBC (EGD, colonoscopy and capsule endoscopy with polyp gastritis but no active bleeding).  He presented to the hospital with cough, shortness of breath.  Oxygen saturation was reportedly around 80% on 2 L of oxygen when EMS arrived. ?  ?He was admitted to the hospital for severe sepsis secondary to community-acquired pneumonia, hypotension, COPD exacerbation, acute hypoxic respiratory failure.  He was treated with IV fluids, empiric IV antibiotics and up to 5 L/min oxygen via nasal cannula.   ?Patient was found to have liver cirrhosis on CT chest.  Patient and his wife said they were not aware of any prior diagnosis of liver cirrhosis.  However, chart review showed  that CT chest in December 2022 showed liver cirrhosis.  Patient has seen Dr. Allen [name], gastroenterologist in the past and he had EGD and colonoscopy in November 2022.  Follow-up with Dr. Allen [name] for management of liver cirrhosis was recommended. ? ?His condition improved and he was weaned down to 2 L/min oxygen which is about his baseline oxygen requirement at home.  Patient said he felt much better and wanted to go home today.  His wife was at the bedside said that patient was "100% better" and they are comfortable going home today.  He is deemed stable for discharge to home. ? ? ?  ? ? ?Consultants: None ?Procedures performed: None ?Disposition: Home ?Diet recommendation:  ?Discharge Diet Orders (From admission, onward)  ? ?  Start     Ordered  ? 02/05/22 0000  Diet - low sodium heart healthy       ? 02/05/22 0945  ? 02/05/22 0000  Diet Carb Modified       ? 02/05/22 0945  ? ?  ?  ? ?  ? ?Cardiac and Carb modified diet ?DISCHARGE MEDICATION: ?Allergies as of 02/05/2022   ? ?   Reactions  ? Betadine [povidone Iodine]   ? ?  ? ?  ?Medication List  ?  ? ?TAKE these medications   ? ?albuterol 108 (90 Base) MCG/ACT inhaler ?Commonly known as: VENTOLIN HFA ?Inhale 2 puffs into the lungs in the morning, at noon, in the evening, and at bedtime. ?  ?amoxicillin-clavulanate 875-125 MG tablet ?Commonly known as: Augmentin ?Take 1 tablet by mouth 2 (two) times daily  for 5 days. ?  ?aspirin 81 MG tablet ?Take 81 mg by mouth daily. ?  ?azithromycin 500 MG tablet ?Commonly known as: Zithromax ?Take 1 tablet (500 mg total) by mouth every evening for 2 days. Take 1 tablet daily for 2 days. ?  ?B-D INSULIN SYRINGE 1CC/25GX1" 25G X 1" 1 ML Misc ?Generic drug: Insulin Syringe-Needle U-100 ?USE AS DIRECTED ?  ?Betaseron 0.3 MG Kit injection ?Generic drug: Interferon Beta-1b ?Inject subcutaneously 1 syringe every other day. ?  ?ferrous sulfate 325 (65 FE) MG tablet ?Take 325 mg by mouth daily with breakfast. ?  ?furosemide 40 MG  tablet ?Commonly known as: LASIX ?Take 0.5 tablets (20 mg total) by mouth every other day. ?  ?gemfibrozil 600 MG tablet ?Commonly known as: LOPID ?TAKE 1 TABLET(600 MG) BY MOUTH TWICE DAILY BEFORE A MEAL ?  ?insulin glargine 100 UNIT/ML Solostar Pen ?Commonly known as: LANTUS ?Inject 32 Units into the skin at bedtime. ?  ?loratadine 10 MG tablet ?Commonly known as: CLARITIN ?Take 1 tablet (10 mg total) by mouth daily. ?  ?losartan 25 MG tablet ?Commonly known as: COZAAR ?Take 1 tablet (25 mg total) by mouth daily. singh ?  ?metFORMIN 500 MG tablet ?Commonly known as: GLUCOPHAGE ?Take 500 mg by mouth 2 (two) times daily. ?  ?omeprazole 40 MG capsule ?Commonly known as: PRILOSEC ?TAKE 1 CAPSULE BY MOUTH EVERY DAY ?  ?pregabalin 200 MG capsule ?Commonly known as: LYRICA ?Take 1 capsule (200 mg total) by mouth 2 (two) times daily. ?  ?sertraline 50 MG tablet ?Commonly known as: ZOLOFT ?Take 1/2 tablet daily ?  ?simvastatin 40 MG tablet ?Commonly known as: ZOCOR ?Take 1 tablet (40 mg total) by mouth daily. ?  ?sodium bicarbonate 650 MG tablet ?Take 650 mg by mouth 2 (two) times daily. ?  ?traMADol 50 MG tablet ?Commonly known as: ULTRAM ?Take 1 tablet (50 mg total) by mouth every 6 (six) hours as needed. ?  ? ?  ? ? ?Discharge Exam: ?Danley Danker Weights  ? 02/03/22 1850  ?Weight: 95.3 kg  ? ?GEN: NAD ?SKIN: Warm and dry ?EYES: No pallor or icterus ?ENT: MMM ?CV: RRR ?PULM: Adequate air entry bilaterally, rales in the left lung base, no wheezing ?ABD: soft, ND, NT, +BS ?CNS: AAO x 3, non focal ?EXT: No edema or tenderness ? ? ?Condition at discharge: good ? ?The results of significant diagnostics from this hospitalization (including imaging, microbiology, ancillary and laboratory) are listed below for reference.  ? ?Imaging Studies: ?CT Angio Chest PE W and/or Wo Contrast ? ?Result Date: 02/03/2022 ?CLINICAL DATA:  Hypoxia and shortness of breath. EXAM: CT ANGIOGRAPHY CHEST WITH CONTRAST TECHNIQUE: Multidetector CT imaging of  the chest was performed using the standard protocol during bolus administration of intravenous contrast. Multiplanar CT image reconstructions and MIPs were obtained to evaluate the vascular anatomy. RADIATION DOSE REDUCTION: This exam was performed according to the departmental dose-optimization program which includes automated exposure control, adjustment of the mA and/or kV according to patient size and/or use of iterative reconstruction technique. CONTRAST:  75m OMNIPAQUE IOHEXOL 350 MG/ML SOLN COMPARISON:  Chest radiograph earlier today. PET CT 11/30/2021, chest CT 12/08/2020 FINDINGS: Cardiovascular: There are no filling defects within the pulmonary arteries to suggest pulmonary embolus. Aortic atherosclerosis without dissection or acute aortic findings. The left vertebral artery arises directly from the thoracic aorta, variant anatomy. Coronary artery calcifications. The heart is normal in size. No pericardial effusion. Mediastinum/Nodes: Scattered small mediastinal and hilar lymph nodes are not enlarged by  size criteria. Patulous upper esophagus. No thyroid nodule. Lungs/Pleura: Dense consolidation in the posterior left lower lobe typical of pneumonia. There are central air bronchograms. Emphysema with moderate bronchial thickening particularly in the left lower lobe. Stable 12 mm right upper lobe nodule that was not hypermetabolic PET. No pleural effusion. Upper Abdomen: Hepatic cirrhosis. No acute upper abdominal findings. Musculoskeletal: There are no acute or suspicious osseous abnormalities. Review of the MIP images confirms the above findings. IMPRESSION: 1. No pulmonary embolus. 2. Dense consolidation in the posterior left lower lobe typical of pneumonia. Recommend radiographic follow-up with PA and lateral views (consolidation is not well seen on AP view) after course of treatment to document resolution. 3. Emphysema with bronchial thickening. 4. Hepatic cirrhosis. Aortic Atherosclerosis  (ICD10-I70.0) and Emphysema (ICD10-J43.9). Electronically Signed   By: Keith Rake M.D.   On: 02/03/2022 21:03  ? ?DG Chest Port 1 View ? ?Result Date: 02/03/2022 ?CLINICAL DATA:  Sepsis short of breath EXAM: PORTABLE CHEST

## 2022-02-05 NOTE — Telephone Encounter (Signed)
LVM @ both phone #s ?

## 2022-02-08 LAB — CULTURE, BLOOD (ROUTINE X 2)
Culture: NO GROWTH
Culture: NO GROWTH
Special Requests: ADEQUATE

## 2022-02-08 NOTE — Telephone Encounter (Signed)
Transition Care Management Unsuccessful Follow-up Telephone Call ? ?Date of discharge and from where:  02/05/22 [facility] ? ?Attempts:  2nd Attempt ? ?Reason for unsuccessful TCM follow-up call:  Left voice message ? ?Pt scheduled with Dr. Ronnald Ramp for hosp fu 02/15/22 @ 1:40 ? ?  ?

## 2022-02-08 NOTE — Telephone Encounter (Signed)
Transition Care Management Follow-up Telephone Call ?Date of discharge and from where: 02/05/22 [facility] ?How have you been since you were released from the hospital? Pt states he is doing okay; resting ?Any questions or concerns? No ? ?Items Reviewed: ?Did the pt receive and understand the discharge instructions provided? Yes  ?Medications obtained and verified? Yes  ?Other? No  ?Any new allergies since your discharge? No  ?Dietary orders reviewed? Yes ?Do you have support at home? Yes  ? ?Home Care and Equipment/Supplies: ?Were home health services ordered? no ? ?Were any new equipment or medical supplies ordered?  No ? ? ?Functional Questionnaire: (I = Independent and D = Dependent) ?ADLs: I ? ?Bathing/Dressing- I ? ?Meal Prep- I ? ?Eating- I ? ?Maintaining continence- I ? ?Transferring/Ambulation- I ? ?Managing Meds- I ? ?Follow up appointments reviewed: ? ?PCP Hospital f/u appt confirmed? Yes  Scheduled to see Dr. Ronnald Ramp on 02/15/22 @ 1:40 ?Are transportation arrangements needed? No  ?If their condition worsens, is the pt aware to call PCP or go to the Emergency Dept.? Yes ?Was the patient provided with contact information for the PCP's office or ED? Yes ?Was to pt encouraged to call back with questions or concerns? Yes  ?

## 2022-02-15 ENCOUNTER — Ambulatory Visit
Admission: RE | Admit: 2022-02-15 | Discharge: 2022-02-15 | Disposition: A | Discharge status: Home / Self Care | Payer: Medicare Other | Attending: Family Medicine | Admitting: Family Medicine

## 2022-02-15 ENCOUNTER — Encounter: Payer: Self-pay | Admitting: Family Medicine

## 2022-02-15 ENCOUNTER — Ambulatory Visit (INDEPENDENT_AMBULATORY_CARE_PROVIDER_SITE_OTHER): Payer: Medicare Other | Admitting: Family Medicine

## 2022-02-15 ENCOUNTER — Ambulatory Visit
Admission: RE | Admit: 2022-02-15 | Discharge: 2022-02-15 | Disposition: A | Discharge status: Home / Self Care | Payer: Medicare Other | Source: Ambulatory Visit | Attending: Family Medicine | Admitting: Family Medicine

## 2022-02-15 ENCOUNTER — Ambulatory Visit: Payer: Medicare Other

## 2022-02-15 VITALS — BP 138/62 | HR 64 | Ht 70.0 in | Wt 212.0 lb

## 2022-02-15 DIAGNOSIS — J189 Pneumonia, unspecified organism: Secondary | ICD-10-CM

## 2022-02-15 DIAGNOSIS — E875 Hyperkalemia: Secondary | ICD-10-CM

## 2022-02-15 DIAGNOSIS — N1831 Chronic kidney disease, stage 3a: Secondary | ICD-10-CM | POA: Diagnosis not present

## 2022-02-15 DIAGNOSIS — D649 Anemia, unspecified: Secondary | ICD-10-CM | POA: Diagnosis not present

## 2022-02-15 DIAGNOSIS — R911 Solitary pulmonary nodule: Secondary | ICD-10-CM | POA: Diagnosis not present

## 2022-02-15 NOTE — Addendum Note (Signed)
Addended by: Juline Patch on: 02/15/2022 02:29 PM ? ? Modules accepted: Level of Service ? ?

## 2022-02-15 NOTE — Progress Notes (Addendum)
? ? ?Date:  02/15/2022  ? ?Name:  Jeremy [name]   DOB:  1951/05/12   MRN:  [id number] ? ? ?Chief Complaint: Hospitalization Follow-up (Admitted with pneumonia on 4/12 and discharged on 02/05/22- TCM call placed on 02/05/22) ? ?Pt was recently admitted to [facility] on 02/04/22 for sepsis secondary from CAP and was discharged on 4/14 23. Transition of care call placed on 02/05/22.  ?  ? ? ?Lab Results  ?Component Value Date  ? NA 131 (L) 02/04/2022  ? K 4.7 02/04/2022  ? CO2 18 (L) 02/04/2022  ? GLUCOSE 345 (H) 02/04/2022  ? BUN 50 (H) 02/04/2022  ? CREATININE 1.68 (H) 02/04/2022  ? CALCIUM 7.9 (L) 02/04/2022  ? EGFR 59 (L) 12/25/2020  ? GFRNONAA 44 (L) 02/04/2022  ? ?Lab Results  ?Component Value Date  ? CHOL 103 07/10/2021  ? HDL 22 (L) 07/10/2021  ? [place] 35 07/10/2021  ? TRIG 228 (H) 07/10/2021  ? CHOLHDL 4.7 07/10/2021  ? ?Lab Results  ?Component Value Date  ? TSH 1.90 03/30/2021  ? ?Lab Results  ?Component Value Date  ? HGBA1C 5.1 09/24/2021  ? ?Lab Results  ?Component Value Date  ? WBC 8.1 02/05/2022  ? HGB 8.2 (L) 02/05/2022  ? HCT 25.6 (L) 02/05/2022  ? MCV 99.2 02/05/2022  ? PLT 143 (L) 02/05/2022  ? ?Lab Results  ?Component Value Date  ? ALT 10 02/03/2022  ? AST 18 02/03/2022  ? ALKPHOS 79 02/03/2022  ? BILITOT 0.8 02/03/2022  ? ?No results found for: 25OHVITD2, [place], VD25OH  ? ?Review of Systems  ?Constitutional:  Negative for chills and fever.  ?HENT:  Negative for drooling, ear discharge, ear pain and sore throat.   ?Respiratory:  Negative for cough, shortness of breath and wheezing.   ?Cardiovascular:  Negative for chest pain, palpitations and leg swelling.  ?Gastrointestinal:  Negative for abdominal pain, blood in stool, constipation, diarrhea and nausea.  ?Endocrine: Negative for polydipsia.  ?Genitourinary:  Negative for dysuria, frequency, hematuria and urgency.  ?Musculoskeletal:  Negative for back pain, myalgias and neck pain.  ?Skin:  Negative for rash.  ?Allergic/Immunologic: Negative  for environmental allergies.  ?Neurological:  Negative for dizziness and headaches.  ?Hematological:  Does not bruise/bleed easily.  ?Psychiatric/Behavioral:  Negative for suicidal ideas. The patient is not nervous/anxious.   ? ?Patient Active Problem List  ? Diagnosis Date Noted  ? Severe sepsis ([place]) 02/04/2022  ? Pneumonia 02/03/2022  ? Acute on chronic respiratory failure with hypoxia ([place]) 02/03/2022  ? Hyperkalemia 02/03/2022  ? COPD with acute exacerbation ([place]) 02/03/2022  ? SIRS (systemic inflammatory response syndrome), possible sepsis ([place]) 02/03/2022  ? Symptomatic anemia 09/24/2021  ? Gastric polyp   ? Gastritis without bleeding   ? Anemia in stage 3a chronic kidney disease ([place]) 06/19/2021  ? Iron deficiency anemia due to chronic blood loss 06/19/2021  ? Gait abnormality 05/07/2021  ? Stage 3a chronic kidney disease ([place]) 05/20/2020  ? DM type 2 with diabetic peripheral neuropathy ([place]) 05/06/2020  ? Idiopathic peripheral neuropathy 01/15/2020  ? Generalized edema 01/15/2020  ? Primary pulmonary hypertension ([place]) 10/28/2019  ? Therapeutic drug monitoring 03/23/2018  ? Mild episode of recurrent major depressive disorder ([place]) 09/26/2017  ? Ventricular ectopic beats 09/26/2017  ? Type 2 diabetes mellitus with diabetic polyneuropathy, with long-term current use of insulin ([place]) 08/12/2017  ? Hyperlipidemia due to type 2 diabetes mellitus ([place]) 08/12/2017  ? B12 deficiency 12/14/2016  ? Low back pain 03/10/2016  ? Lumbosacral disc  disease 01/01/2016  ? Neuropathy associated with endocrine disorder ([place]) 11/19/2015  ? B12 deficiency anemia 06/03/2015  ? Essential hypertension 06/03/2015  ? Hyperlipidemia 06/03/2015  ? Depression 06/03/2015  ? Gastroesophageal reflux disease without esophagitis 06/03/2015  ? Edema extremities 06/03/2015  ? Multiple sclerosis ([place]) 07/26/2013  ? Abnormality of gait 07/26/2013  ? Morbid obesity ([place]) 07/26/2013  ? ? ?Allergies  ?Allergen Reactions  ? Betadine [Povidone  Iodine]   ? ? ?Past Surgical History:  ?Procedure Laterality Date  ? COLECTOMY  06-2008  ? COLONOSCOPY  2015  ? normal  ? COLONOSCOPY WITH PROPOFOL N/A 09/10/2021  ? Procedure: COLONOSCOPY WITH PROPOFOL;  Surgeon: [name], MD;  Location[facility] ENDOSCOPY;  Service: Endoscopy;  Laterality: N/A;  ? ESOPHAGOGASTRODUODENOSCOPY (EGD) WITH PROPOFOL N/A 09/10/2021  ? Procedure: ESOPHAGOGASTRODUODENOSCOPY (EGD) WITH PROPOFOL;  Surgeon: [name], MD;  Location: [facility] ENDOSCOPY;  Service: Endoscopy;  Laterality: N/A;  ? GIVENS CAPSULE STUDY N/A 09/25/2021  ? Procedure: GIVENS CAPSULE STUDY;  Surgeon: Jonathon Bellows, MD;  Location: [facility] ENDOSCOPY;  Service: Gastroenterology;  Laterality: N/A;  ? ? ?Social History  ? ?Tobacco Use  ? Smoking status: Former  ?  Packs/day: 1.50  ?  Years: 30.00  ?  Pack years: 45.00  ?  Types: Cigarettes  ?  Quit date: 01/23/2006  ?  Years since quitting: 16.0  ? Smokeless tobacco: Never  ? Tobacco comments:  ?  N/A  ?Vaping Use  ? Vaping Use: Never used  ?Substance Use Topics  ? Alcohol use: Not Currently  ?  Comment: rare; maybe 2 beers a year  ? Drug use: No  ? ? ? ?Medication list has been reviewed and updated. ? ?Current Meds  ?Medication Sig  ? albuterol (VENTOLIN HFA) 108 (90 Base) MCG/ACT inhaler Inhale 2 puffs into the lungs in the morning, at noon, in the evening, and at bedtime.  ? aspirin 81 MG tablet Take 81 mg by mouth daily.  ? B-D INSULIN SYRINGE 1CC/25GX1" 25G X 1" 1 ML MISC USE AS DIRECTED  ? ferrous sulfate 325 (65 FE) MG tablet Take 325 mg by mouth daily with breakfast.  ? furosemide (LASIX) 40 MG tablet Take 0.5 tablets (20 mg total) by mouth every other day.  ? gemfibrozil (LOPID) 600 MG tablet TAKE 1 TABLET(600 MG) BY MOUTH TWICE DAILY BEFORE A MEAL  ? Insulin Glargine (LANTUS) 100 UNIT/ML Solostar Pen Inject 32 Units into the skin at bedtime.  ? Interferon Beta-1b (BETASERON) 0.3 MG KIT injection Inject subcutaneously 1 syringe every other day.  ? loratadine (CLARITIN) 10 MG  tablet Take 1 tablet (10 mg total) by mouth daily.  ? losartan (COZAAR) 25 MG tablet Take 1 tablet (25 mg total) by mouth daily. singh  ? metFORMIN (GLUCOPHAGE) 500 MG tablet Take 500 mg by mouth 2 (two) times daily.  ? omeprazole (PRILOSEC) 40 MG capsule TAKE 1 CAPSULE BY MOUTH EVERY DAY  ? pregabalin (LYRICA) 200 MG capsule Take 1 capsule (200 mg total) by mouth 2 (two) times daily.  ? sertraline (ZOLOFT) 50 MG tablet Take 1/2 tablet daily  ? simvastatin (ZOCOR) 40 MG tablet Take 1 tablet (40 mg total) by mouth daily.  ? sodium bicarbonate 650 MG tablet Take 650 mg by mouth 2 (two) times daily.  ? traMADol (ULTRAM) 50 MG tablet Take 1 tablet (50 mg total) by mouth every 6 (six) hours as needed.  ? ? ? ?  02/15/2022  ?  1:36 PM 12/28/2021  ?  1:24  PM 06/30/2021  ?  9:24 AM 09/11/2020  ?  2:03 PM  ?GAD 7 : Generalized Anxiety Score  ?Nervous, Anxious, on Edge 0 0 0 0  ?Control/stop worrying 0 0 0 0  ?Worry too much - different things 0 0 0 0  ?Trouble relaxing 0 0 0 0  ?Restless 0 0 0 0  ?Easily annoyed or irritable 0 0 0 0  ?Afraid - awful might happen 0 0 0 0  ?Total GAD 7 Score 0 0 0 0  ?Anxiety Difficulty Not difficult at all Not difficult at all    ? ? ? ?  02/15/2022  ?  1:35 PM  ?Depression screen PHQ 2/9  ?Decreased Interest 0  ?Down, Depressed, Hopeless 0  ?PHQ - 2 Score 0  ?Altered sleeping 0  ?Tired, decreased energy 0  ?Change in appetite 0  ?Feeling bad or failure about yourself  0  ?Trouble concentrating 0  ?Moving slowly or fidgety/restless 0  ?Suicidal thoughts 0  ?PHQ-9 Score 0  ?Difficult doing work/chores Not difficult at all  ? ? ?BP Readings from Last 3 Encounters:  ?02/15/22 138/62  ?02/05/22 (!) 142/67  ?01/04/22 (!) 149/61  ? ? ?Physical Exam ?Vitals and nursing note reviewed.  ?HENT:  ?   Head: Normocephalic.  ?   Right Ear: External ear normal.  ?   Left Ear: External ear normal.  ?   Nose: Nose normal.  ?Eyes:  ?   General: No scleral icterus.    ?   Right eye: No discharge.     ?   Left eye: No  discharge.  ?   Conjunctiva/sclera: Conjunctivae normal.  ?   Pupils: Pupils are equal, round, and reactive to light.  ?Neck:  ?   Thyroid: No thyromegaly.  ?   Vascular: No JVD.  ?   Trachea: No tracheal

## 2022-02-16 LAB — CBC WITH DIFFERENTIAL/PLATELET
Basophils Absolute: 0.1 10*3/uL (ref 0.0–0.2)
Basos: 1 %
EOS (ABSOLUTE): 0.3 10*3/uL (ref 0.0–0.4)
Eos: 6 %
Hematocrit: 29.3 % — ABNORMAL LOW (ref 37.5–51.0)
Hemoglobin: 9.6 g/dL — ABNORMAL LOW (ref 13.0–17.7)
Immature Grans (Abs): 0 10*3/uL (ref 0.0–0.1)
Immature Granulocytes: 1 %
Lymphocytes Absolute: 0.8 10*3/uL (ref 0.7–3.1)
Lymphs: 14 %
MCH: 30.9 pg (ref 26.6–33.0)
MCHC: 32.8 g/dL (ref 31.5–35.7)
MCV: 94 fL (ref 79–97)
Monocytes Absolute: 0.6 10*3/uL (ref 0.1–0.9)
Monocytes: 10 %
Neutrophils Absolute: 3.8 10*3/uL (ref 1.4–7.0)
Neutrophils: 68 %
Platelets: 261 10*3/uL (ref 150–450)
RBC: 3.11 x10E6/uL — ABNORMAL LOW (ref 4.14–5.80)
RDW: 13.5 % (ref 11.6–15.4)
WBC: 5.5 10*3/uL (ref 3.4–10.8)

## 2022-02-16 LAB — RENAL FUNCTION PANEL
Albumin: 3.6 g/dL — ABNORMAL LOW (ref 3.8–4.8)
BUN/Creatinine Ratio: 23 (ref 10–24)
BUN: 33 mg/dL — ABNORMAL HIGH (ref 8–27)
CO2: 19 mmol/L — ABNORMAL LOW (ref 20–29)
Calcium: 9.2 mg/dL (ref 8.6–10.2)
Chloride: 104 mmol/L (ref 96–106)
Creatinine, Ser: 1.43 mg/dL — ABNORMAL HIGH (ref 0.76–1.27)
Glucose: 133 mg/dL — ABNORMAL HIGH (ref 70–99)
Phosphorus: 3.4 mg/dL (ref 2.8–4.1)
Potassium: 5.8 mmol/L — ABNORMAL HIGH (ref 3.5–5.2)
Sodium: 138 mmol/L (ref 134–144)
eGFR: 53 mL/min/{1.73_m2} — ABNORMAL LOW (ref >59)

## 2022-02-26 DIAGNOSIS — Z20822 Contact with and (suspected) exposure to covid-19: Secondary | ICD-10-CM | POA: Diagnosis not present

## 2022-03-09 ENCOUNTER — Ambulatory Visit: Payer: Medicare Other | Admitting: Gastroenterology

## 2022-03-16 DIAGNOSIS — H40003 Preglaucoma, unspecified, bilateral: Secondary | ICD-10-CM | POA: Diagnosis not present

## 2022-04-05 DIAGNOSIS — E1142 Type 2 diabetes mellitus with diabetic polyneuropathy: Secondary | ICD-10-CM | POA: Diagnosis not present

## 2022-04-05 DIAGNOSIS — Z794 Long term (current) use of insulin: Secondary | ICD-10-CM | POA: Diagnosis not present

## 2022-04-05 DIAGNOSIS — E785 Hyperlipidemia, unspecified: Secondary | ICD-10-CM | POA: Diagnosis not present

## 2022-04-05 DIAGNOSIS — E1169 Type 2 diabetes mellitus with other specified complication: Secondary | ICD-10-CM | POA: Diagnosis not present

## 2022-04-05 LAB — HEMOGLOBIN A1C: Hemoglobin A1C: 4.8

## 2022-04-05 LAB — HM DIABETES FOOT EXAM: HM Diabetic Foot Exam: NORMAL

## 2022-04-06 LAB — HM DIABETES EYE EXAM

## 2022-04-07 ENCOUNTER — Other Ambulatory Visit: Payer: Self-pay | Admitting: Neurology

## 2022-04-07 NOTE — Telephone Encounter (Signed)
Verify Drug Registry For Tramadol Hcl 50 Mg Tablet Last Filled: 12/30/2021 Quantity: 120 tablets for 30 days Last appointment: 05/07/2021 Next appointment: 05/04/2022

## 2022-04-12 DIAGNOSIS — Z9981 Dependence on supplemental oxygen: Secondary | ICD-10-CM | POA: Diagnosis not present

## 2022-04-12 DIAGNOSIS — J449 Chronic obstructive pulmonary disease, unspecified: Secondary | ICD-10-CM | POA: Diagnosis not present

## 2022-04-12 DIAGNOSIS — R918 Other nonspecific abnormal finding of lung field: Secondary | ICD-10-CM | POA: Diagnosis not present

## 2022-05-03 ENCOUNTER — Ambulatory Visit: Payer: Medicare Other | Admitting: Neurology

## 2022-05-04 ENCOUNTER — Ambulatory Visit (INDEPENDENT_AMBULATORY_CARE_PROVIDER_SITE_OTHER): Payer: Medicare Other | Admitting: Neurology

## 2022-05-04 VITALS — BP 129/59 | HR 69 | Ht 70.0 in | Wt 210.0 lb

## 2022-05-04 DIAGNOSIS — G609 Hereditary and idiopathic neuropathy, unspecified: Secondary | ICD-10-CM | POA: Diagnosis not present

## 2022-05-04 DIAGNOSIS — G35 Multiple sclerosis: Secondary | ICD-10-CM | POA: Diagnosis not present

## 2022-05-04 NOTE — Progress Notes (Signed)
Patient: Jeremy [name] Date of Birth: 01-10-1951  Reason for Visit: Follow up History from: Patient, wife  Primary Neurologist: Dr. Krista Blue  ASSESSMENT AND PLAN 71 y.o. year old male   1.  Relapsing remitting multiple sclerosis 2.  Gait abnormality 3.  Diabetic peripheral neuropathy  -Seems to be overall general decline age related, post-COVID -Continue Betaseron, he desires to remain on, reportedly worsening gait abnormality when discontinuing in 2020 -Continue tramadol as needed for chronic pain -We will reach out to PCP to clarify Lyrica, has been coming from PCP over the last several years -Most recent MRI of the brain in 2018 showed multiple subcortical, periventricular nonenhancing lesion, no change compared to previous scan in 2015, no contrast-enhancement  HISTORY  He has PMHx of  diabetes, hypertension and multiple sclerosis.  The diagnosis of multiple sclerosis was confirmed by abnormal MRI brain and cervical, and a spinal fluid testing.  He was initially followed by Dr. Estella Husk in 2008. Last seen in this office Oct 2014, He continues to have gait difficulties and relies on a cane for ambulation.  He has had no problems with his vision or bowel/bladder control.   He has had occasional falls.He has ongoing pain in his legs. Last MS flare was over 2 years ago. He is currently on Betaseron every other day, treatment started in 2007.    MRI cervical in 2013 with findings consistent with MS plaques and no enhancement,mild spinal stenosis.   MRI of the brain with prominent white matter changes consistent with MS. No active areas of demyelination. He continues to have problems with low     The initial symptom was acute onset of left leg more than arm weakness, gait difficulty  in 2006.  This eventually led to the diagnosis of relapsing remitting multiple sclerosis. He has been receiving Betaseron treatment since early 2007.  He has tolerated the medication well.  He has had no  problems with lipoatrophy or skin necrosis. In September 2009, he developed GI bleeding.  Workup had demonstrated colon inflammation and obstruction.He underwent colectomy and recovered well from that procedure.   He continues to have gait difficulties and relies on a cane for ambulation.  He has had no problems with his vision or bowel/bladder control. He does have chronic extremity pain, which has been managed with Lyrica and Tramadol.    He uses an Transport planner for situations in which he would need to walk long distances. He has had occasional falls.He has ongoing pain in his legs   UPDATE 07/2014: He was admitted to hospital in Nov 2014 for anemia, low blood count, was evaluated by hematologist in [facility], now has recovered, he still has moderate gait difficulty.also complains bilateral lower extremity spasticity, is taking Lyrica, there was no clinical flareups, last MRI was in 2013, there was significant lesions in brain, and cervical spine    UPDATE 09/03/2014: He has tried Baclofen 78m qhs, complains of excessive sleepiness, could not tolerate it, he still mows the yard, the most limitation is his gait difficulty,he denies bowel and bladder incontinence.   We have reviewed MRI togather, MRI scan of brain showing periventricular and subcortical white matter hyperintensities consistent with multiple sclerosis. Presence of atrophy of corpus callosum indicates chronic disease. No significant change compared with MRI 06/02/2012    MRI cervical spine showing mild spondylitic changes descibed above most prominent at C 3-4 where there is moderate left formaminal narrowing. Ill defined spinal cord hypertintensities at C 3-4 to c 6-7  likely represent chronic multiple sclerosis inactive plaques. No significant change compared with MRI C spine 06/02/2012   UPDATE 11//17/16 Jeremy [name], 71 year old male returns for follow-up. He was last seen in this office 03/06/15.  He was diagnosed with multiple sclerosis  in 2006. He is currently on Betaseron every other day without injection site issues.. He still remains fairly active mowing the yard, denies any bowel or bladder incontinence. He denies any sensory changes,  double vision speech or swallowing difficulty. He ambulates with a cane. He has had a couple of falls since last seen. He returns for reevaluation   UPDATE May 17th 2017:YY He is with his wife at today's clinical visit, he continued to receive Betaseron every other day without significant side effect, he has significant gait difficulty, left side is weaker, complains of chronic fatigue, chronic low back pain, bilateral lower extremity neuropathic pain, taking Lyrica, tramadol as needed,  Reviewed laboratory evaluation, A1c 7.9, CMP showed elevated creatinine 1.4, normal CBC  We again personally reviewed MRI of the scan without contrast, most recent was in 2015, MRI of the brain showed multiple supratentorium lesions, no contrast enhancement, MRI of cervical spine showed ill-defined C3-C7 cord lesion, no contrast enhancement, no significant change compared to previous scans   Update September 22, 2017:YY Creatinine 1.27 in July 2018, he is overall doing very well, mild gait abnormality, use cane, still active at home, providing normal, no bowel bladder incontinence, diabetes, diabetic peripheral neuropathy, using Betaseron, last MRI was in 2015,   UPDATE May 01 2019: He is overall stable, continue ambulate with a cane, slow worsening gait abnormality, heat intolerance, I personally reviewed MRI of the brain with without contrast in December 2018: Multiple subcortical, periventricular nonenhancing lesion, no significant change compared to previous scan in October 2015   Laboratory evaluations in September 2019: Hg 12.4,  LDL 84, A1C 5.7   UPDATE May 06 2020: He is accompanied by his wife at today's clinic visit, he is overall stable, only slow as expected decline with aging, rely on his cane more,  denies bowel and bladder incontinence, he tolerated Betaseron well, does not want to change   We reviewed MRI of the brain with without contrast together, most recently in December 2018, multiple supratentorial MS lesions, no change compared to previous scan in October 2015, T1 black holes, no contrast-enhancement   Laboratory evaluation in March 2021: CBC showed anemia hemoglobin of 10.9, CMP abnormal creatinine 1.37, lipid panel, LDL 68   UPDATE May 07 2021: He is accompanied by his wife at today's visit, suffered COVID in January 2022, severely symptomatic, following that, he has worsening shortness of breath, previous smoker, was seen by pulmonologist, suspected pulmonary hypertension, oxygen desaturation, questionable residual effects from [place], he was started on portable oxygen at 2 L, gave inhaler  Patient complains of worsening functional status since then, generalized fatigue, lack of stamina, still to ambulate with walker   Laboratory evaluation in June 2022, A1c 4.9, TSH 1.95, CMP creat 1.31, LDL 69, Hg. 9.8  Update May 04, 2022 SS: Here with his wife, today is his 71th birthday. Now has oxygen continuously. Gets fatigued easily, balance is poor. Has transport wheelchair. Remains on Betaseron, gets drug company assistance. Hospitalized in Dec, was getting iron infusion, HBG was 5. PNA in April. Takes Lyrica 200 mg twice daily, Tramadol PRN. Has cane, gets SOB, mostly uses rollator. Rarely drives a car.  Remains on Lyrica for neuropathy, reports PCP mentioned will no longer refill.  REVIEW OF SYSTEMS: Out of a complete 14 system review of symptoms, the patient complains only of the following symptoms, and all other reviewed systems are negative.  See HPI  ALLERGIES: Allergies  Allergen Reactions   Betadine [Povidone Iodine]     HOME MEDICATIONS: Outpatient Medications Prior to Visit  Medication Sig Dispense Refill   albuterol (VENTOLIN HFA) 108 (90 Base) MCG/ACT inhaler  Inhale 2 puffs into the lungs in the morning, at noon, in the evening, and at bedtime.     aspirin 81 MG tablet Take 81 mg by mouth daily.     B-D INSULIN SYRINGE 1CC/25GX1" 25G X 1" 1 ML MISC USE AS DIRECTED 10 each 2   ferrous sulfate 325 (65 FE) MG tablet Take 325 mg by mouth daily with breakfast.     furosemide (LASIX) 40 MG tablet Take 0.5 tablets (20 mg total) by mouth every other day.     gemfibrozil (LOPID) 600 MG tablet TAKE 1 TABLET(600 MG) BY MOUTH TWICE DAILY BEFORE A MEAL 180 tablet 1   Insulin Glargine (LANTUS) 100 UNIT/ML Solostar Pen Inject 32 Units into the skin at bedtime.     Interferon Beta-1b (BETASERON) 0.3 MG KIT injection Inject subcutaneously 1 syringe every other day. 45 kit 4   loratadine (CLARITIN) 10 MG tablet Take 1 tablet (10 mg total) by mouth daily. 90 tablet 1   losartan (COZAAR) 25 MG tablet Take 1 tablet (25 mg total) by mouth daily. singh 90 tablet 1   metFORMIN (GLUCOPHAGE) 500 MG tablet Take 500 mg by mouth 2 (two) times daily.     omeprazole (PRILOSEC) 40 MG capsule TAKE 1 CAPSULE BY MOUTH EVERY DAY 90 capsule 1   pregabalin (LYRICA) 200 MG capsule Take 1 capsule (200 mg total) by mouth 2 (two) times daily. 60 capsule 5   sertraline (ZOLOFT) 50 MG tablet Take 1/2 tablet daily 45 tablet 5   simvastatin (ZOCOR) 40 MG tablet Take 1 tablet (40 mg total) by mouth daily. 90 tablet 1   sodium bicarbonate 650 MG tablet Take 650 mg by mouth 2 (two) times daily.     traMADol (ULTRAM) 50 MG tablet TAKE 1 TABLET(50 MG) BY MOUTH EVERY 6 HOURS AS NEEDED 120 tablet 0   No facility-administered medications prior to visit.    PAST MEDICAL HISTORY: Past Medical History:  Diagnosis Date   Chronic pain    Depression    Diabetes (HCC)    GERD (gastroesophageal reflux disease)    Hyperlipemia    Hypertension    MS (multiple sclerosis) ([place])     PAST SURGICAL HISTORY: Past Surgical History:  Procedure Laterality Date   COLECTOMY  06-2008   COLONOSCOPY  2015    normal   COLONOSCOPY WITH PROPOFOL N/A 09/10/2021   Procedure: COLONOSCOPY WITH PROPOFOL;  Surgeon: [name], MD;  Location: [facility] ENDOSCOPY;  Service: Endoscopy;  Laterality: N/A;   ESOPHAGOGASTRODUODENOSCOPY (EGD) WITH PROPOFOL N/A 09/10/2021   Procedure: ESOPHAGOGASTRODUODENOSCOPY (EGD) WITH PROPOFOL;  Surgeon: [name], MD;  Location: [facility] ENDOSCOPY;  Service: Endoscopy;  Laterality: N/A;   GIVENS CAPSULE STUDY N/A 09/25/2021   Procedure: GIVENS CAPSULE STUDY;  Surgeon: Jonathon Bellows, MD;  [facility] ENDOSCOPY;  Service: Gastroenterology;  Laterality: N/A;    FAMILY HISTORY: Family History  Problem Relation Age of Onset   Lung cancer Mother    Heart attack Father    Heart attack Brother    COPD Brother    Diabetes Brother    Esophageal cancer Nephew  SOCIAL HISTORY: Social History   Socioeconomic History   Marital status: Married    Spouse name: [name]   Number of children: 2   Years of education: GED   Highest education level: Not on file  Occupational History    Comment: Disabled  Tobacco Use   Smoking status: Former    Packs/day: 1.50    Years: 30.00    Total pack years: 45.00    Types: Cigarettes    Quit date: 01/23/2006    Years since quitting: 16.2   Smokeless tobacco: Never   Tobacco comments:    N/A  Vaping Use   Vaping Use: Never used  Substance and Sexual Activity   Alcohol use: Not Currently    Comment: rare; maybe 2 beers a year   Drug use: No   Sexual activity: Not Currently  Other Topics Concern   Not on file  Social History Narrative   Patient is disabled.    Patient lives with his wife Owynn Mosqueda.    Patient has 2 children.       Social Determinants of Health   Financial Resource Strain: Low Risk  (11/16/2021)   Overall Financial Resource Strain (CARDIA)    Difficulty of Paying Living Expenses: Not hard at all  Food Insecurity: No Food Insecurity (11/16/2021)   Hunger Vital Sign    Worried About Running Out of Food in the Last  Year: Never true    Ran Out of Food in the Last Year: Never true  Transportation Needs: No Transportation Needs (11/16/2021)   PRAPARE - Hydrologist (Medical): No    Lack of Transportation (Non-Medical): No  Physical Activity: Sufficiently Active (11/16/2021)   Exercise Vital Sign    Days of Exercise per Week: 7 days    Minutes of Exercise per Session: 30 min  Stress: No Stress Concern Present (11/16/2021)   [place]    Feeling of Stress : Not at all  Social Connections: Moderately Isolated (11/16/2021)   Social Connection and Isolation Panel [NHANES]    Frequency of Communication with Friends and Family: More than three times a week    Frequency of Social Gatherings with Friends and Family: Once a week    Attends Religious Services: Never    Marine scientist or Organizations: No    Attends Archivist Meetings: Never    Marital Status: Married  Human resources officer Violence: Not At Risk (11/16/2021)   Humiliation, Afraid, Rape, and Kick questionnaire    Fear of Current or Ex-Partner: No    Emotionally Abused: No    Physically Abused: No    Sexually Abused: No   PHYSICAL EXAM  Vitals:   05/04/22 1326  BP: (!) 129/59  Pulse: 69  Weight: 210 lb (95.3 kg)  Height: 5' 10"  (1.778 m)   Body mass index is 30.13 kg/m.  Generalized: Well developed, in no acute distress, wearing continuous oxygen Neurological examination  Mentation: Alert oriented to time, place, history taking. Follows all commands speech and language fluent Cranial nerve II-XII: Pupils were equal round reactive to light. Extraocular movements were full, visual field were full on confrontational test. Facial sensation and strength were normal.  Head turning and shoulder shrug were normal and symmetric. Motor: Mild generalized weakness bilateral lower extremities, increased spasticity lower extremities Sensory:  Length dependent sensory deficit to soft touch Coordination: Cerebellar testing reveals good finger-nose-finger and heel-to-shin bilaterally.  Gait and station:  In wheelchair, trying to stand is a lot of effort  DIAGNOSTIC DATA (LABS, IMAGING, TESTING) - I reviewed patient records, labs, notes, testing and imaging myself where available.  Lab Results  Component Value Date   WBC 5.5 02/15/2022   HGB 9.6 (L) 02/15/2022   HCT 29.3 (L) 02/15/2022   MCV 94 02/15/2022   PLT 261 02/15/2022      Component Value Date/Time   NA 138 02/15/2022 1416   NA 143 08/31/2013 0417   K 5.8 (H) 02/15/2022 1416   K 3.5 08/31/2013 0417   CL 104 02/15/2022 1416   CL 109 (H) 08/31/2013 0417   CO2 19 (L) 02/15/2022 1416   CO2 28 08/31/2013 0417   GLUCOSE 133 (H) 02/15/2022 1416   GLUCOSE 345 (H) 02/04/2022 1308   GLUCOSE 133 (H) 08/31/2013 0417   BUN 33 (H) 02/15/2022 1416   BUN 39 (H) 08/31/2013 0417   CREATININE 1.43 (H) 02/15/2022 1416   CREATININE 1.53 (H) 08/31/2013 0417   CALCIUM 9.2 02/15/2022 1416   CALCIUM 8.9 08/31/2013 0417   PROT 7.9 02/03/2022 1852   PROT 6.9 12/25/2020 1058   PROT 6.2 (L) 08/31/2013 0417   ALBUMIN 3.6 (L) 02/15/2022 1416   ALBUMIN 2.9 (L) 08/31/2013 0417   AST 18 02/03/2022 1852   AST 35 08/31/2013 0417   ALT 10 02/03/2022 1852   ALT 15 08/31/2013 0417   ALKPHOS 79 02/03/2022 1852   ALKPHOS 60 08/31/2013 0417   BILITOT 0.8 02/03/2022 1852   BILITOT <0.2 12/25/2020 1058   BILITOT 0.6 08/31/2013 0417   GFRNONAA 44 (L) 02/04/2022 1308   GFRNONAA 48 (L) 08/31/2013 0417   GFRAA 61 01/15/2020 0954   GFRAA 56 (L) 08/31/2013 0417   Lab Results  Component Value Date   CHOL 103 07/10/2021   HDL 22 (L) 07/10/2021   LDLCALC 35 07/10/2021   TRIG 228 (H) 07/10/2021   CHOLHDL 4.7 07/10/2021   Lab Results  Component Value Date   HGBA1C 5.1 09/24/2021   Lab Results  Component Value Date   PJKDTOIZ12 458 09/25/2021   Lab Results  Component Value Date   TSH  1.90 03/30/2021    Butler Denmark, AGNP-C, DNP 05/04/2022, 2:45 PM [facility] [address], [place], [place] [address] [phone]

## 2022-05-09 ENCOUNTER — Other Ambulatory Visit: Payer: Self-pay | Admitting: Family Medicine

## 2022-05-09 DIAGNOSIS — E782 Mixed hyperlipidemia: Secondary | ICD-10-CM

## 2022-05-09 DIAGNOSIS — K219 Gastro-esophageal reflux disease without esophagitis: Secondary | ICD-10-CM

## 2022-05-12 ENCOUNTER — Other Ambulatory Visit: Payer: Self-pay | Admitting: Neurology

## 2022-05-13 ENCOUNTER — Telehealth: Payer: Self-pay | Admitting: Family Medicine

## 2022-05-13 NOTE — Telephone Encounter (Signed)
I called the patient, because I noticed the drug registry showed refill of Tramadol 04/07/22 # 120, prior to that was in 12/30/21 # 120, September 14, 2022... Reason for long interval earlier this year for hospital visit, he takes 2-3 times a day. I will send in the Tramadol.   Meds ordered this encounter  Medications   traMADol (ULTRAM) 50 MG tablet    Sig: TAKE 1 TABLET(50 MG) BY MOUTH EVERY 6 HOURS AS NEEDED    Dispense:  120 tablet    Refill:  0

## 2022-05-13 NOTE — Telephone Encounter (Signed)
Copied from [place] [phone]. Topic: General - Other >> May 13, 2022 12:21 PM Penni Bombard wrote: reason for CRM: Pt 's wife called wanting to know if Dr. Ronnald Ramp has talked to the patients neurologist abut prescribing her husbands Pregablin.  She said Dr. Ronnald Ramp told him she would not fill that prescription any longer.  That his neurologist will need to fill it.  CB#  [phone]

## 2022-05-14 ENCOUNTER — Telehealth: Payer: Self-pay

## 2022-05-14 NOTE — Telephone Encounter (Signed)
I called yesterday and today to Butler Denmark NP at [facility]- [phone] to ask about Lyrica and Tramadol together. Left messages as well as sent Sarah a chat. I will try to call the office again on Monday. Pt is aware I am working on this and trying to get someone at that office. I verified that pt had enough Lyrica to get him through until Monday. He said he does.

## 2022-05-17 ENCOUNTER — Other Ambulatory Visit: Payer: Self-pay | Admitting: Family Medicine

## 2022-05-17 ENCOUNTER — Other Ambulatory Visit: Payer: Self-pay

## 2022-05-17 ENCOUNTER — Telehealth: Payer: Self-pay | Admitting: Neurology

## 2022-05-17 DIAGNOSIS — G609 Hereditary and idiopathic neuropathy, unspecified: Secondary | ICD-10-CM

## 2022-05-17 DIAGNOSIS — E349 Endocrine disorder, unspecified: Secondary | ICD-10-CM

## 2022-05-17 MED ORDER — PREGABALIN 200 MG PO CAPS: 200.0000 mg | ORAL_CAPSULE | Freq: Two times a day (BID) | ORAL | 3 refills | Status: DC

## 2022-05-17 NOTE — Telephone Encounter (Signed)
[place] (Dr. Ronnald Ramp) would like Judson Roch, NP to call me to discuss pt medication,pregabalin (LYRICA) 200 MG capsule .

## 2022-05-18 NOTE — Telephone Encounter (Signed)
Spoke with Dr. Ronnald Ramp today, Lyrica will continue to come from their office, will continue to evaluate going forward. I called the patient.

## 2022-05-31 ENCOUNTER — Telehealth: Payer: Self-pay | Admitting: Family Medicine

## 2022-05-31 NOTE — Telephone Encounter (Signed)
Copied from [place] [phone]. Topic: General - Other >> May 31, 2022  4:09 PM Chapman Fitch wrote: Reason for CRM: [employer] sent over cheek swab paper work for genetic testing to the office on 8.31.23. they were calling to confirm it was received

## 2022-06-14 ENCOUNTER — Inpatient Hospital Stay: Payer: Medicare Other | Attending: Oncology

## 2022-06-14 ENCOUNTER — Other Ambulatory Visit: Payer: Self-pay

## 2022-06-14 DIAGNOSIS — D500 Iron deficiency anemia secondary to blood loss (chronic): Secondary | ICD-10-CM | POA: Diagnosis not present

## 2022-06-14 DIAGNOSIS — Z79899 Other long term (current) drug therapy: Secondary | ICD-10-CM | POA: Insufficient documentation

## 2022-06-14 DIAGNOSIS — E1122 Type 2 diabetes mellitus with diabetic chronic kidney disease: Secondary | ICD-10-CM | POA: Diagnosis not present

## 2022-06-14 DIAGNOSIS — N1831 Chronic kidney disease, stage 3a: Secondary | ICD-10-CM | POA: Insufficient documentation

## 2022-06-14 DIAGNOSIS — E538 Deficiency of other specified B group vitamins: Secondary | ICD-10-CM | POA: Diagnosis not present

## 2022-06-14 DIAGNOSIS — G35 Multiple sclerosis: Secondary | ICD-10-CM | POA: Insufficient documentation

## 2022-06-14 DIAGNOSIS — I129 Hypertensive chronic kidney disease with stage 1 through stage 4 chronic kidney disease, or unspecified chronic kidney disease: Secondary | ICD-10-CM | POA: Diagnosis not present

## 2022-06-14 DIAGNOSIS — Z794 Long term (current) use of insulin: Secondary | ICD-10-CM | POA: Insufficient documentation

## 2022-06-14 DIAGNOSIS — Z7982 Long term (current) use of aspirin: Secondary | ICD-10-CM | POA: Insufficient documentation

## 2022-06-14 DIAGNOSIS — Z7984 Long term (current) use of oral hypoglycemic drugs: Secondary | ICD-10-CM | POA: Insufficient documentation

## 2022-06-14 DIAGNOSIS — D631 Anemia in chronic kidney disease: Secondary | ICD-10-CM | POA: Insufficient documentation

## 2022-06-14 LAB — VITAMIN B12: Vitamin B-12: 1525 pg/mL — ABNORMAL HIGH (ref 180–914)

## 2022-06-14 LAB — CBC WITH DIFFERENTIAL/PLATELET
Abs Immature Granulocytes: 0.01 10*3/uL (ref 0.00–0.07)
Basophils Absolute: 0.1 10*3/uL (ref 0.0–0.1)
Basophils Relative: 1 %
Eosinophils Absolute: 0.3 10*3/uL (ref 0.0–0.5)
Eosinophils Relative: 7 %
HCT: 28.5 % — ABNORMAL LOW (ref 39.0–52.0)
Hemoglobin: 8.9 g/dL — ABNORMAL LOW (ref 13.0–17.0)
Immature Granulocytes: 0 %
Lymphocytes Relative: 18 %
Lymphs Abs: 0.8 10*3/uL (ref 0.7–4.0)
MCH: 30.1 pg (ref 26.0–34.0)
MCHC: 31.2 g/dL (ref 30.0–36.0)
MCV: 96.3 fL (ref 80.0–100.0)
Monocytes Absolute: 0.4 10*3/uL (ref 0.1–1.0)
Monocytes Relative: 9 %
Neutro Abs: 2.9 10*3/uL (ref 1.7–7.7)
Neutrophils Relative %: 65 %
Platelets: 211 10*3/uL (ref 150–400)
RBC: 2.96 MIL/uL — ABNORMAL LOW (ref 4.22–5.81)
RDW: 15.5 % (ref 11.5–15.5)
WBC: 4.4 10*3/uL (ref 4.0–10.5)
nRBC: 0 % (ref 0.0–0.2)

## 2022-06-14 LAB — COMPREHENSIVE METABOLIC PANEL
ALT: 12 U/L (ref 0–44)
AST: 19 U/L (ref 15–41)
Albumin: 3.3 g/dL — ABNORMAL LOW (ref 3.5–5.0)
Alkaline Phosphatase: 72 U/L (ref 38–126)
Anion gap: 8 (ref 5–15)
BUN: 35 mg/dL — ABNORMAL HIGH (ref 8–23)
CO2: 23 mmol/L (ref 22–32)
Calcium: 9.1 mg/dL (ref 8.9–10.3)
Chloride: 108 mmol/L (ref 98–111)
Creatinine, Ser: 1.4 mg/dL — ABNORMAL HIGH (ref 0.61–1.24)
GFR, Estimated: 54 mL/min — ABNORMAL LOW (ref >60)
Glucose, Bld: 107 mg/dL — ABNORMAL HIGH (ref 70–99)
Potassium: 4.8 mmol/L (ref 3.5–5.1)
Sodium: 139 mmol/L (ref 135–145)
Total Bilirubin: 0.5 mg/dL (ref 0.3–1.2)
Total Protein: 7 g/dL (ref 6.5–8.1)

## 2022-06-14 LAB — IRON AND TIBC
Iron: 62 ug/dL (ref 45–182)
Saturation Ratios: 13 % — ABNORMAL LOW (ref 17.9–39.5)
TIBC: 484 ug/dL — ABNORMAL HIGH (ref 250–450)
UIBC: 422 ug/dL

## 2022-06-14 LAB — FERRITIN: Ferritin: 17 ng/mL — ABNORMAL LOW (ref 24–336)

## 2022-06-14 MED ORDER — TRAMADOL HCL 50 MG PO TABS: ORAL_TABLET | ORAL | 0 refills | Status: DC

## 2022-06-14 NOTE — Progress Notes (Signed)
Verify Drug Registry For Tramadol Hcl 50 Mg Tablet Last Filled: 05/13/2022 Quantity: 120 tablets for 30 days Last appointment: 05/17/2022 Next appointment: 05/03/2023

## 2022-06-15 ENCOUNTER — Inpatient Hospital Stay (HOSPITAL_BASED_OUTPATIENT_CLINIC_OR_DEPARTMENT_OTHER): Payer: Medicare Other | Admitting: Oncology

## 2022-06-15 ENCOUNTER — Inpatient Hospital Stay: Payer: Medicare Other

## 2022-06-15 ENCOUNTER — Encounter: Payer: Self-pay | Admitting: Oncology

## 2022-06-15 VITALS — BP 113/51 | HR 78 | Temp 98.7°F | Resp 18 | Wt 209.0 lb

## 2022-06-15 DIAGNOSIS — N1831 Chronic kidney disease, stage 3a: Secondary | ICD-10-CM

## 2022-06-15 DIAGNOSIS — D500 Iron deficiency anemia secondary to blood loss (chronic): Secondary | ICD-10-CM | POA: Diagnosis not present

## 2022-06-15 DIAGNOSIS — D631 Anemia in chronic kidney disease: Secondary | ICD-10-CM

## 2022-06-15 DIAGNOSIS — E538 Deficiency of other specified B group vitamins: Secondary | ICD-10-CM | POA: Diagnosis not present

## 2022-06-15 DIAGNOSIS — E1122 Type 2 diabetes mellitus with diabetic chronic kidney disease: Secondary | ICD-10-CM | POA: Diagnosis not present

## 2022-06-15 DIAGNOSIS — G35 Multiple sclerosis: Secondary | ICD-10-CM | POA: Diagnosis not present

## 2022-06-15 MED ORDER — SODIUM CHLORIDE 0.9 % IV SOLN
200.0000 mg | Freq: Once | INTRAVENOUS | Status: AC
  Administered 2022-06-15: 200 mg via INTRAVENOUS
  Filled 2022-06-15: qty 200

## 2022-06-15 MED ORDER — SODIUM CHLORIDE 0.9 % IV SOLN
INTRAVENOUS | Status: DC
  Filled 2022-06-15: qty 250

## 2022-06-15 NOTE — Progress Notes (Signed)
Hematology/Oncology Progress Note    Patient Care Team: Juline Patch, MD as PCP - General (Family Medicine) Marcial Pacas, MD as Consulting Physician (Neurology) Samara Deist, DPM as Consulting Physician (Podiatry) Lonia Farber, MD as Consulting Physician (Internal Medicine) Magnus Sinning, MD as Consulting Physician (Nephrology) Earlie Server, MD as Consulting Physician (Oncology)  REFERRING PROVIDER: Juline Patch, MD  CHIEF COMPLAINTS/REASON FOR VISIT:  Follow  up for anemia  PERTINENT HEMATOLOGY HISTORY  Jeremy [name] is a 71 y.o. male who has above history reviewed by me today presents for follow up visit for anemia 09/24/2021,-09/27/2021, patient had hemoglobin dropped to 5.3 and was advised to go to emergency room be admitted.  Patient received 2 units of PRBC transfusion IV Venofer.    Endoscopy showed gastritis.  Small capsule study showed small bowel polyp.  His hemoglobin improved to 8.2 and patient was discharged.  #12/04/2021 Small Bowel Capsule Study Showed Nonbleeding AVM, esophagitis, gastritis, poor prep.  Small bowel polyp, area of erythema in colon obscured by poor prep.   INTERVAL HISTORY Jeremy [name] is a 71 y.o. male who has above history reviewed by me today presents for follow up visit for management of iron deficiency anemia. Patient reports feeling better.  Denies any black tarry or bloody stool.  Review of Systems  Constitutional:  Positive for fatigue. Negative for appetite change, chills and fever.  HENT:   Negative for hearing loss and voice change.   Eyes:  Negative for eye problems and icterus.  Respiratory:  Positive for shortness of breath. Negative for chest tightness and cough.   Cardiovascular:  Negative for chest pain and leg swelling.  Gastrointestinal:  Negative for abdominal distention and abdominal pain.  Endocrine: Negative for hot flashes.  Genitourinary:  Negative for difficulty urinating, dysuria and frequency.    Musculoskeletal:  Negative for arthralgias.  Skin:  Negative for itching and rash.  Neurological:  Negative for light-headedness and numbness.  Hematological:  Negative for adenopathy. Does not bruise/bleed easily.  Psychiatric/Behavioral:  Negative for confusion.     MEDICAL HISTORY:  Past Medical History:  Diagnosis Date   Chronic pain    Depression    Diabetes (HCC)    GERD (gastroesophageal reflux disease)    Hyperlipemia    Hypertension    MS (multiple sclerosis) ([place])     SURGICAL HISTORY: Past Surgical History:  Procedure Laterality Date   COLECTOMY  06-2008   COLONOSCOPY  2015   normal   COLONOSCOPY WITH PROPOFOL N/A 09/10/2021   Procedure: COLONOSCOPY WITH PROPOFOL;  Surgeon: [name], MD;  Location: [facility] ENDOSCOPY;  Service: Endoscopy;  Laterality: N/A;   ESOPHAGOGASTRODUODENOSCOPY (EGD) WITH PROPOFOL N/A 09/10/2021   Procedure: ESOPHAGOGASTRODUODENOSCOPY (EGD) WITH PROPOFOL;  Surgeon: [name], MD;  Location: [facility] ENDOSCOPY;  Service: Endoscopy;  Laterality: N/A;   GIVENS CAPSULE STUDY N/A 09/25/2021   Procedure: GIVENS CAPSULE STUDY;  Surgeon: Jonathon Bellows, MD;  Location: [facility] ENDOSCOPY;  Service: Gastroenterology;  Laterality: N/A;    SOCIAL HISTORY: Social History   Socioeconomic History   Marital status: Married    Spouse name: Jeremy [name]   Number of children: 2   Years of education: GED   Highest education level: Not on file  Occupational History    Comment: Disabled  Tobacco Use   Smoking status: Former    Packs/day: 1.50    Years: 30.00    Total pack years: 45.00    Types: Cigarettes    Quit date: 01/23/2006    Years  since quitting: 16.4   Smokeless tobacco: Never   Tobacco comments:    N/A  Vaping Use   Vaping Use: Never used  Substance and Sexual Activity   Alcohol use: Not Currently    Comment: rare; maybe 2 beers a year   Drug use: No   Sexual activity: Not Currently  Other Topics Concern   Not on file  Social History Narrative    Patient is disabled.    Patient lives with his wife Jeremy [name].    Patient has 2 children.       Social Determinants of Health   Financial Resource Strain: Low Risk  (11/16/2021)   Overall Financial Resource Strain (CARDIA)    Difficulty of Paying Living Expenses: Not hard at all  Food Insecurity: No Food Insecurity (11/16/2021)   Hunger Vital Sign    Worried About Running Out of Food in the Last Year: Never true    Ran Out of Food in the Last Year: Never true  Transportation Needs: No Transportation Needs (11/16/2021)   PRAPARE - Hydrologist (Medical): No    Lack of Transportation (Non-Medical): No  Physical Activity: Sufficiently Active (11/16/2021)   Exercise Vital Sign    Days of Exercise per Week: 7 days    Minutes of Exercise per Session: 30 min  Stress: No Stress Concern Present (11/16/2021)   [place]    Feeling of Stress : Not at all  Social Connections: Moderately Isolated (11/16/2021)   Social Connection and Isolation Panel [NHANES]    Frequency of Communication with Friends and Family: More than three times a week    Frequency of Social Gatherings with Friends and Family: Once a week    Attends Religious Services: Never    Marine scientist or Organizations: No    Attends Archivist Meetings: Never    Marital Status: Married  Human resources officer Violence: Not At Risk (11/16/2021)   Humiliation, Afraid, Rape, and Kick questionnaire    Fear of Current or Ex-Partner: No    Emotionally Abused: No    Physically Abused: No    Sexually Abused: No    FAMILY HISTORY: Family History  Problem Relation Age of Onset   Lung cancer Mother    Heart attack Father    Heart attack Brother    COPD Brother    Diabetes Brother    Esophageal cancer Nephew     ALLERGIES:  is allergic to betadine [povidone iodine].  MEDICATIONS:  Current Outpatient Medications  Medication  Sig Dispense Refill   albuterol (VENTOLIN HFA) 108 (90 Base) MCG/ACT inhaler Inhale 2 puffs into the lungs in the morning, at noon, in the evening, and at bedtime.     aspirin 81 MG tablet Take 81 mg by mouth daily.     B-D INSULIN SYRINGE 1CC/25GX1" 25G X 1" 1 ML MISC USE AS DIRECTED 10 each 2   ferrous sulfate 325 (65 FE) MG tablet Take 325 mg by mouth daily with breakfast.     furosemide (LASIX) 40 MG tablet Take 0.5 tablets (20 mg total) by mouth every other day.     gemfibrozil (LOPID) 600 MG tablet TAKE 1 TABLET BY MOUTH TWICE  DAILY BEFORE MEALS 180 tablet 0   Insulin Glargine (LANTUS) 100 UNIT/ML Solostar Pen Inject 32 Units into the skin at bedtime.     Interferon Beta-1b (BETASERON) 0.3 MG KIT injection Inject subcutaneously 1 syringe every  other day. 45 kit 4   loratadine (CLARITIN) 10 MG tablet Take 1 tablet (10 mg total) by mouth daily. 90 tablet 1   losartan (COZAAR) 25 MG tablet Take 1 tablet (25 mg total) by mouth daily. singh 90 tablet 1   metFORMIN (GLUCOPHAGE) 500 MG tablet Take 500 mg by mouth 2 (two) times daily.     omeprazole (PRILOSEC) 40 MG capsule TAKE 1 CAPSULE BY MOUTH DAILY 90 capsule 0   pregabalin (LYRICA) 200 MG capsule Take 1 capsule (200 mg total) by mouth 2 (two) times daily. 60 capsule 3   sertraline (ZOLOFT) 50 MG tablet Take 1/2 tablet daily 45 tablet 5   simvastatin (ZOCOR) 40 MG tablet Take 1 tablet (40 mg total) by mouth daily. 90 tablet 1   sodium bicarbonate 650 MG tablet Take 650 mg by mouth 2 (two) times daily.     traMADol (ULTRAM) 50 MG tablet TAKE 1 TABLET(50 MG) BY MOUTH EVERY 6 HOURS AS NEEDED 120 tablet 0   No current facility-administered medications for this visit.     PHYSICAL EXAMINATION: ECOG PERFORMANCE STATUS: 2 - Symptomatic, <50% confined to bed Vitals:   06/15/22 1405  BP: (!) 113/51  [name]: 79  Resp: 18  Temp: 98.7 F (37.1 C)  SpO2: 94%   Filed Weights   06/15/22 1405  Weight: 209 lb (94.8 kg)    Physical  Exam Constitutional:      General: He is not in acute distress. HENT:     Head: Normocephalic and atraumatic.  Eyes:     General: No scleral icterus. Cardiovascular:     Rate and Rhythm: Normal rate and regular rhythm.     Heart sounds: Normal heart sounds.  Pulmonary:     Effort: Pulmonary effort is normal. No respiratory distress.     Breath sounds: No wheezing.     Comments: Decreased breath sound bilaterally.   Abdominal:     General: Bowel sounds are normal. There is no distension.     Palpations: Abdomen is soft.  Musculoskeletal:        General: No deformity. Normal range of motion.     Cervical back: Normal range of motion and neck supple.  Skin:    General: Skin is warm and dry.     Findings: No erythema or rash.  Neurological:     Mental Status: He is alert and oriented to person, place, and time. Mental status is at baseline.     Cranial Nerves: No cranial nerve deficit.     Coordination: Coordination normal.  Psychiatric:        Mood and Affect: Mood normal.     LABORATORY DATA:  I have reviewed the data as listed Lab Results  Component Value Date   WBC 4.4 06/14/2022   HGB 8.9 (L) 06/14/2022   HCT 28.5 (L) 06/14/2022   MCV 96.3 06/14/2022   PLT 211 06/14/2022   Recent Labs    07/23/21 1342 09/24/21 1252 09/25/21 0718 02/03/22 1852 02/04/22 0610 02/04/22 1308 02/15/22 1416 06/14/22 1307  NA  --  137   < > 137 134* 131* 138 139  K  --  4.8   < > 5.3* 4.7 4.7 5.8* 4.8  CL  --  112*   < > 108 108 106 104 108  CO2  --  18*   < > 20* 20* 18* 19* 23  GLUCOSE  --  102*   < > 124* 276* 345* 133* 107*  BUN  --  37*   < > 39* 47* 50* 33* 35*  CREATININE  --  1.24   < > 1.48* 1.59* 1.68* 1.43* 1.40*  CALCIUM  --  9.1   < > 8.9 8.1* 7.9* 9.2 9.1  GFRNONAA  --  >60   < > 51* 47* 44*  --  54*  PROT 7.3 6.6  --  7.9  --   --   --  7.0  ALBUMIN 3.4* 3.2*  --  3.5  --   --  3.6* 3.3*  AST 22 16  --  18  --   --   --  19  ALT 14 10  --  10  --   --   --  12   ALKPHOS 58 52  --  79  --   --   --  72  BILITOT 0.7 0.4  --  0.8  --   --   --  0.5  BILIDIR <0.1  --   --   --   --   --   --   --   IBILI NOT CALCULATED  --   --   --   --   --   --   --    < > = values in this interval not displayed.    Iron/TIBC/Ferritin/ %Sat    Component Value Date/Time   IRON 62 06/14/2022 1307   IRON 63 (L) 10/10/2013 1130   TIBC 484 (H) 06/14/2022 1307   TIBC 416 10/10/2013 1130   FERRITIN 17 (L) 06/14/2022 1307   FERRITIN 90 10/10/2013 1130   IRONPCTSAT 13 (L) 06/14/2022 1307   IRONPCTSAT 15 10/10/2013 1130       RADIOGRAPHIC STUDIES: I have personally reviewed the radiological images as listed and agreed with the findings in the report. No results found.    ASSESSMENT & PLAN:  1. Iron deficiency anemia due to chronic blood loss   2. Anemia in stage 3a chronic kidney disease (HCC)    #Iron deficiency anemia due to chronic blood loss secondary to AVM, in the context of chronic kidney disease. Labs reviewed and discussed with patient. Consistent with persistent iron deficiency anemia. Recommend IV Venofer 200 mg x 4 doses,-every 2 weeks  #Esophagitis and gastritis, AVM, recommend patient to follow-up with gastroenterology  #History of vitamin B-12 deficiency.  B12 is elevated.  Labs in 4 months (cbc,iron,ferr, B12) / MD & poss venofer 1-2 days after labs    Orders Placed This Encounter  Procedures   CBC with Differential    Standing Status:   Future    Standing Expiration Date:   06/16/2023   Ferritin    Standing Status:   Future    Standing Expiration Date:   06/16/2023   Retic Panel    Standing Status:   Future    Standing Expiration Date:   06/16/2023   Iron and TIBC([facility])    Standing Status:   Future    Standing Expiration Date:   06/16/2023    All questions were answered. The patient knows to call the clinic with any problems questions or concerns.  cc Juline Patch, MD     Earlie Server, MD, PhD  06/15/2022

## 2022-06-24 DIAGNOSIS — D649 Anemia, unspecified: Secondary | ICD-10-CM | POA: Diagnosis not present

## 2022-06-24 DIAGNOSIS — E1129 Type 2 diabetes mellitus with other diabetic kidney complication: Secondary | ICD-10-CM | POA: Diagnosis not present

## 2022-06-24 DIAGNOSIS — I10 Essential (primary) hypertension: Secondary | ICD-10-CM | POA: Diagnosis not present

## 2022-06-24 DIAGNOSIS — R801 Persistent proteinuria, unspecified: Secondary | ICD-10-CM | POA: Diagnosis not present

## 2022-06-24 DIAGNOSIS — N182 Chronic kidney disease, stage 2 (mild): Secondary | ICD-10-CM | POA: Diagnosis not present

## 2022-06-29 ENCOUNTER — Other Ambulatory Visit: Payer: Self-pay

## 2022-06-29 DIAGNOSIS — N182 Chronic kidney disease, stage 2 (mild): Secondary | ICD-10-CM | POA: Diagnosis not present

## 2022-06-29 DIAGNOSIS — I10 Essential (primary) hypertension: Secondary | ICD-10-CM | POA: Diagnosis not present

## 2022-06-29 DIAGNOSIS — R801 Persistent proteinuria, unspecified: Secondary | ICD-10-CM | POA: Diagnosis not present

## 2022-06-29 DIAGNOSIS — D649 Anemia, unspecified: Secondary | ICD-10-CM | POA: Diagnosis not present

## 2022-06-29 DIAGNOSIS — E1129 Type 2 diabetes mellitus with other diabetic kidney complication: Secondary | ICD-10-CM | POA: Diagnosis not present

## 2022-06-30 ENCOUNTER — Inpatient Hospital Stay: Payer: Medicare Other | Attending: Oncology

## 2022-06-30 VITALS — BP 145/58 | HR 76 | Temp 98.2°F | Resp 18

## 2022-06-30 DIAGNOSIS — D631 Anemia in chronic kidney disease: Secondary | ICD-10-CM | POA: Insufficient documentation

## 2022-06-30 DIAGNOSIS — D500 Iron deficiency anemia secondary to blood loss (chronic): Secondary | ICD-10-CM | POA: Diagnosis not present

## 2022-06-30 DIAGNOSIS — N1831 Chronic kidney disease, stage 3a: Secondary | ICD-10-CM | POA: Diagnosis not present

## 2022-06-30 MED ORDER — SODIUM CHLORIDE 0.9 % IV SOLN
INTRAVENOUS | Status: DC
  Filled 2022-06-30: qty 250

## 2022-06-30 MED ORDER — SODIUM CHLORIDE 0.9% FLUSH
10.0000 mL | Freq: Once | INTRAVENOUS | Status: AC | PRN
  Administered 2022-06-30: 10 mL
  Filled 2022-06-30: qty 10

## 2022-06-30 MED ORDER — SODIUM CHLORIDE 0.9 % IV SOLN
200.0000 mg | Freq: Once | INTRAVENOUS | Status: AC
  Administered 2022-06-30: 200 mg via INTRAVENOUS
  Filled 2022-06-30: qty 200

## 2022-06-30 NOTE — Patient Instructions (Signed)

## 2022-07-01 DIAGNOSIS — E1129 Type 2 diabetes mellitus with other diabetic kidney complication: Secondary | ICD-10-CM | POA: Diagnosis not present

## 2022-07-01 DIAGNOSIS — N1831 Chronic kidney disease, stage 3a: Secondary | ICD-10-CM | POA: Diagnosis not present

## 2022-07-01 DIAGNOSIS — I10 Essential (primary) hypertension: Secondary | ICD-10-CM | POA: Diagnosis not present

## 2022-07-01 DIAGNOSIS — D508 Other iron deficiency anemias: Secondary | ICD-10-CM | POA: Diagnosis not present

## 2022-07-02 ENCOUNTER — Ambulatory Visit (INDEPENDENT_AMBULATORY_CARE_PROVIDER_SITE_OTHER): Payer: Medicare Other | Admitting: Family Medicine

## 2022-07-02 ENCOUNTER — Encounter: Payer: Self-pay | Admitting: Family Medicine

## 2022-07-02 VITALS — BP 124/60 | HR 64 | Ht 70.0 in | Wt 210.0 lb

## 2022-07-02 DIAGNOSIS — K219 Gastro-esophageal reflux disease without esophagitis: Secondary | ICD-10-CM | POA: Diagnosis not present

## 2022-07-02 DIAGNOSIS — G63 Polyneuropathy in diseases classified elsewhere: Secondary | ICD-10-CM | POA: Diagnosis not present

## 2022-07-02 DIAGNOSIS — J301 Allergic rhinitis due to pollen: Secondary | ICD-10-CM | POA: Diagnosis not present

## 2022-07-02 DIAGNOSIS — I10 Essential (primary) hypertension: Secondary | ICD-10-CM | POA: Diagnosis not present

## 2022-07-02 DIAGNOSIS — Z23 Encounter for immunization: Secondary | ICD-10-CM

## 2022-07-02 DIAGNOSIS — G609 Hereditary and idiopathic neuropathy, unspecified: Secondary | ICD-10-CM | POA: Diagnosis not present

## 2022-07-02 DIAGNOSIS — E349 Endocrine disorder, unspecified: Secondary | ICD-10-CM

## 2022-07-02 DIAGNOSIS — E782 Mixed hyperlipidemia: Secondary | ICD-10-CM

## 2022-07-02 DIAGNOSIS — T148XXA Other injury of unspecified body region, initial encounter: Secondary | ICD-10-CM

## 2022-07-02 DIAGNOSIS — F331 Major depressive disorder, recurrent, moderate: Secondary | ICD-10-CM

## 2022-07-02 MED ORDER — LORATADINE 10 MG PO TABS: 10.0000 mg | ORAL_TABLET | Freq: Every day | ORAL | 1 refills | Status: DC

## 2022-07-02 MED ORDER — GEMFIBROZIL 600 MG PO TABS: ORAL_TABLET | ORAL | 1 refills | Status: DC

## 2022-07-02 MED ORDER — MUPIROCIN 2 % EX OINT: 1.0000 | TOPICAL_OINTMENT | Freq: Two times a day (BID) | CUTANEOUS | 1 refills | Status: DC

## 2022-07-02 MED ORDER — OMEPRAZOLE 40 MG PO CPDR: DELAYED_RELEASE_CAPSULE | ORAL | 1 refills | Status: DC

## 2022-07-02 MED ORDER — LOSARTAN POTASSIUM 25 MG PO TABS: 25.0000 mg | ORAL_TABLET | Freq: Every day | ORAL | 1 refills | Status: DC

## 2022-07-02 MED ORDER — SIMVASTATIN 40 MG PO TABS: 40.0000 mg | ORAL_TABLET | Freq: Every day | ORAL | 1 refills | Status: DC

## 2022-07-02 MED ORDER — PREGABALIN 200 MG PO CAPS: 200.0000 mg | ORAL_CAPSULE | Freq: Two times a day (BID) | ORAL | 3 refills | Status: DC

## 2022-07-02 MED ORDER — SERTRALINE HCL 50 MG PO TABS: ORAL_TABLET | ORAL | 1 refills | Status: DC

## 2022-07-02 NOTE — Progress Notes (Signed)
Date:  07/02/2022   Name:  Jeremy [name]   DOB:  July 30, 1951   MRN:  [id number]   Chief Complaint: Hyperlipidemia, Depression, Gastroesophageal Reflux, Hypertension, Allergic Rhinitis , Peripheral Neuropathy, and Flu Vaccine  Hyperlipidemia This is a chronic problem. The current episode started more than 1 year ago. The problem is controlled. Recent lipid tests were reviewed and are normal. He has no history of chronic renal disease, diabetes, hypothyroidism, liver disease, obesity or nephrotic syndrome. There are no known factors aggravating his hyperlipidemia. Pertinent negatives include no chest pain, leg pain, myalgias or shortness of breath. Current antihyperlipidemic treatment includes statins. The current treatment provides moderate improvement of lipids. There are no compliance problems.   Depression        This is a chronic problem.  The current episode started more than 1 year ago.   The onset quality is gradual.   The problem occurs intermittently.  The problem has been gradually improving since onset.  Associated symptoms include no decreased concentration, no fatigue, no helplessness, no hopelessness, does not have insomnia, not irritable, no restlessness, no decreased interest, no appetite change, no body aches, no myalgias, no headaches, no indigestion, not sad and no suicidal ideas.     The symptoms are aggravated by medication.  Past treatments include SSRIs - Selective serotonin reuptake inhibitors.  Compliance with treatment is good.  Previous treatment provided moderate relief.   Pertinent negatives include no hypothyroidism. Gastroesophageal Reflux He reports no abdominal pain, no chest pain, no coughing, no dysphagia, no heartburn, no hoarse voice, no nausea, no sore throat or no wheezing. This is a chronic problem. The symptoms are aggravated by certain foods. Pertinent negatives include no fatigue. He has tried a PPI for the symptoms. The treatment provided moderate relief.   Hypertension This is a chronic problem. The current episode started more than 1 year ago. The problem has been gradually improving since onset. The problem is controlled. Pertinent negatives include no blurred vision, chest pain, headaches, neck pain, orthopnea, palpitations or shortness of breath. Past treatments include angiotensin blockers. The current treatment provides moderate improvement. There is no history of angina, kidney disease, CAD/MI, CVA, heart failure, left ventricular hypertrophy or retinopathy. There is no history of chronic renal disease, a hypertension causing med or renovascular disease.    Lab Results  Component Value Date   NA 139 06/14/2022   K 4.8 06/14/2022   CO2 23 06/14/2022   GLUCOSE 107 (H) 06/14/2022   BUN 35 (H) 06/14/2022   CREATININE 1.40 (H) 06/14/2022   CALCIUM 9.1 06/14/2022   EGFR 53 (L) 02/15/2022   GFRNONAA 54 (L) 06/14/2022   Lab Results  Component Value Date   CHOL 103 07/10/2021   HDL 22 (L) 07/10/2021   LDLCALC 35 07/10/2021   TRIG 228 (H) 07/10/2021   CHOLHDL 4.7 07/10/2021   Lab Results  Component Value Date   TSH 1.90 03/30/2021   Lab Results  Component Value Date   HGBA1C 4.8 04/05/2022   Lab Results  Component Value Date   WBC 4.4 06/14/2022   HGB 8.9 (L) 06/14/2022   HCT 28.5 (L) 06/14/2022   MCV 96.3 06/14/2022   PLT 211 06/14/2022   Lab Results  Component Value Date   ALT 12 06/14/2022   AST 19 06/14/2022   ALKPHOS 72 06/14/2022   BILITOT 0.5 06/14/2022   No results found for: "25OHVITD2", "25OHVITD3", "VD25OH"   Review of Systems  Constitutional:  Negative for appetite change, chills,  fatigue and fever.  HENT:  Negative for drooling, ear discharge, ear pain, hoarse voice and sore throat.   Eyes:  Negative for blurred vision.  Respiratory:  Negative for cough, shortness of breath and wheezing.   Cardiovascular:  Negative for chest pain, palpitations, orthopnea and leg swelling.  Gastrointestinal:  Negative  for abdominal pain, blood in stool, constipation, diarrhea, dysphagia, heartburn and nausea.  Endocrine: Negative for polydipsia.  Genitourinary:  Negative for dysuria, frequency, hematuria and urgency.  Musculoskeletal:  Negative for back pain, myalgias and neck pain.  Skin:  Negative for rash.  Allergic/Immunologic: Negative for environmental allergies.  Neurological:  Negative for dizziness and headaches.  Hematological:  Does not bruise/bleed easily.  Psychiatric/Behavioral:  Positive for depression. Negative for decreased concentration and suicidal ideas. The patient is not nervous/anxious and does not have insomnia.     Patient Active Problem List   Diagnosis Date Noted   Severe sepsis ([place]) 02/04/2022   Pneumonia 02/03/2022   Acute on chronic respiratory failure with hypoxia ([place]) 02/03/2022   Hyperkalemia 02/03/2022   COPD with acute exacerbation ([place]) 02/03/2022   SIRS (systemic inflammatory response syndrome), possible sepsis ([place]) 02/03/2022   Symptomatic anemia 09/24/2021   Gastric polyp    Gastritis without bleeding    Anemia in stage 3a chronic kidney disease ([place]) 06/19/2021   Iron deficiency anemia due to chronic blood loss 06/19/2021   Gait abnormality 05/07/2021   Stage 3a chronic kidney disease ([place]) 05/20/2020   DM type 2 with diabetic peripheral neuropathy ([place]) 05/06/2020   Idiopathic peripheral neuropathy 01/15/2020   Generalized edema 01/15/2020   Primary pulmonary hypertension ([place]) 10/28/2019   Therapeutic drug monitoring 03/23/2018   Mild episode of recurrent major depressive disorder ([place]) 09/26/2017   Ventricular ectopic beats 09/26/2017   Type 2 diabetes mellitus with diabetic polyneuropathy, with long-term current use of insulin ([place]) 08/12/2017   Hyperlipidemia due to type 2 diabetes mellitus ([place]) 08/12/2017   B12 deficiency 12/14/2016   Low back pain 03/10/2016   Lumbosacral disc disease 01/01/2016   Neuropathy associated with endocrine disorder  ([place]) 11/19/2015   B12 deficiency anemia 06/03/2015   Essential hypertension 06/03/2015   Hyperlipidemia 06/03/2015   Depression 06/03/2015   Gastroesophageal reflux disease without esophagitis 06/03/2015   Edema extremities 06/03/2015   Multiple sclerosis ([place]) 07/26/2013   Abnormality of gait 07/26/2013   Morbid obesity ([place]) 07/26/2013    Allergies  Allergen Reactions   Betadine [Povidone Iodine]     Past Surgical History:  Procedure Laterality Date   COLECTOMY  06-2008   COLONOSCOPY  2015   normal   COLONOSCOPY WITH PROPOFOL N/A 09/10/2021   Procedure: COLONOSCOPY WITH PROPOFOL;  Surgeon: [name], MD;  Location: [facility] ENDOSCOPY;  Service: Endoscopy;  Laterality: N/A;   ESOPHAGOGASTRODUODENOSCOPY (EGD) WITH PROPOFOL N/A 09/10/2021   Procedure: ESOPHAGOGASTRODUODENOSCOPY (EGD) WITH PROPOFOL;  Surgeon: [name], MD;  Location: [facility] ENDOSCOPY;  Service: Endoscopy;  Laterality: N/A;   GIVENS CAPSULE STUDY N/A 09/25/2021   Procedure: GIVENS CAPSULE STUDY;  Surgeon: Jonathon Bellows, MD;  Location[facility] END[facility]COPY;  Service: Gastroenterology;  Laterality: N/A;    Social History   Tobacco Use   Smoking status: Former    Packs/day: 1.50    Years: 30.00    Total pack years: 45.00    Types: Cigarettes    Quit date: 01/23/2006    Years since quitting: 16.4   Smokeless tobacco: Never   Tobacco comments:    N/A  Vaping Use   Vaping Use:  Never used  Substance Use Topics   Alcohol use: Not Currently    Comment: rare; maybe 2 beers a year   Drug use: No     Medication list has been reviewed and updated.  Current Meds  Medication Sig   albuterol (VENTOLIN HFA) 108 (90 Base) MCG/ACT inhaler Inhale 2 puffs into the lungs in the morning, at noon, in the evening, and at bedtime.   aspirin 81 MG tablet Take 81 mg by mouth daily.   B-D INSULIN SYRINGE 1CC/25GX1" 25G X 1" 1 ML MISC USE AS DIRECTED   ferrous sulfate 325 (65 FE) MG tablet Take 325 mg by mouth daily with breakfast.    furosemide (LASIX) 40 MG tablet Take 0.5 tablets (20 mg total) by mouth every other day.   gemfibrozil (LOPID) 600 MG tablet TAKE 1 TABLET BY MOUTH TWICE  DAILY BEFORE MEALS   Insulin Glargine (LANTUS) 100 UNIT/ML Solostar Pen Inject 32 Units into the skin at bedtime.   Interferon Beta-1b (BETASERON) 0.3 MG KIT injection Inject subcutaneously 1 syringe every other day.   loratadine (CLARITIN) 10 MG tablet Take 1 tablet (10 mg total) by mouth daily.   losartan (COZAAR) 25 MG tablet Take 1 tablet (25 mg total) by mouth daily. singh   metFORMIN (GLUCOPHAGE) 500 MG tablet Take 500 mg by mouth 2 (two) times daily.   mupirocin ointment (BACTROBAN) 2 % 1 Application 2 (two) times daily. Apply to effected area bid   omeprazole (PRILOSEC) 40 MG capsule TAKE 1 CAPSULE BY MOUTH DAILY   pregabalin (LYRICA) 200 MG capsule Take 1 capsule (200 mg total) by mouth 2 (two) times daily.   sertraline (ZOLOFT) 50 MG tablet Take 1/2 tablet daily   simvastatin (ZOCOR) 40 MG tablet Take 1 tablet (40 mg total) by mouth daily.   sodium bicarbonate 650 MG tablet Take 650 mg by mouth 2 (two) times daily.   traMADol (ULTRAM) 50 MG tablet TAKE 1 TABLET(50 MG) BY MOUTH EVERY 6 HOURS AS NEEDED       07/02/2022    1:33 PM 02/15/2022    1:36 PM 12/28/2021    1:24 PM 06/30/2021    9:24 AM  GAD 7 : Generalized Anxiety Score  Nervous, Anxious, on Edge 0 0 0 0  Control/stop worrying 0 0 0 0  Worry too much - different things 0 0 0 0  Trouble relaxing 0 0 0 0  Restless 0 0 0 0  Easily annoyed or irritable 0 0 0 0  Afraid - awful might happen 0 0 0 0  Total GAD 7 Score 0 0 0 0  Anxiety Difficulty Not difficult at all Not difficult at all Not difficult at all        07/02/2022    1:33 PM 02/15/2022    1:35 PM 12/28/2021    1:24 PM  Depression screen PHQ 2/9  Decreased Interest 0 0 0  Down, Depressed, Hopeless 0 0 0  PHQ - 2 Score 0 0 0  Altered sleeping 0 0 0  Tired, decreased energy 0 0 0  Change in appetite 0 0 0   Feeling bad or failure about yourself  0 0 0  Trouble concentrating 0 0 0  Moving slowly or fidgety/restless 0 0 0  Suicidal thoughts 0 0 0  PHQ-9 Score 0 0 0  Difficult doing work/chores Not difficult at all Not difficult at all Not difficult at all    BP Readings from Last 3 Encounters:  07/02/22  124/60  06/30/22 (!) 145/58  06/15/22 (!) 113/51    Physical Exam Vitals and nursing note reviewed.  Constitutional:      General: He is not irritable. HENT:     Head: Normocephalic.     Right Ear: Tympanic membrane and external ear normal.     Left Ear: Tympanic membrane and external ear normal.     Nose: Nose normal.     Mouth/Throat:     Mouth: Mucous membranes are moist.  Eyes:     General: No scleral icterus.       Right eye: No discharge.        Left eye: No discharge.     Conjunctiva/sclera: Conjunctivae normal.     Pupils: Pupils are equal, round, and reactive to light.  Neck:     Thyroid: No thyromegaly.     Vascular: No JVD.     Trachea: No tracheal deviation.  Cardiovascular:     Rate and Rhythm: Normal rate and regular rhythm.     Heart sounds: Normal heart sounds. No murmur heard.    No friction rub. No gallop.  Pulmonary:     Effort: No respiratory distress.     Breath sounds: Normal breath sounds. No wheezing, rhonchi or rales.  Abdominal:     General: Bowel sounds are normal.     Palpations: Abdomen is soft. There is no mass.     Tenderness: There is no abdominal tenderness. There is no guarding or rebound.  Musculoskeletal:        General: No tenderness. Normal range of motion.     Cervical back: Normal range of motion and neck supple.  Lymphadenopathy:     Cervical: No cervical adenopathy.  Skin:    General: Skin is warm.     Findings: No rash.  Neurological:     Mental Status: He is alert and oriented to person, place, and time.     Cranial Nerves: No cranial nerve deficit.     Deep Tendon Reflexes: Reflexes are normal and symmetric.     Wt  Readings from Last 3 Encounters:  07/02/22 210 lb (95.3 kg)  06/15/22 209 lb (94.8 kg)  05/04/22 210 lb (95.3 kg)    BP 124/60   Pulse 64   Ht $R'5\' 10"'vt$  (1.778 m)   Wt 210 lb (95.3 kg)   BMI 30.13 kg/m   Assessment and Plan:  1. Essential hypertension Chronic.  Controlled.  Stable.  Blood pressure today is 124/60.  Continue losartan 25 mg once a day. - losartan (COZAAR) 25 MG tablet; Take 1 tablet (25 mg total) by mouth daily. singh  Dispense: 90 tablet; Refill: 1  2. Mixed hyperlipidemia Chronic.  Controlled.  Stable.  Continue gemfibrozil 600 mg 1 twice a day as well as simvastatin 40 mg once a day. - gemfibrozil (LOPID) 600 MG tablet; TAKE 1 TABLET BY MOUTH TWICE  DAILY BEFORE MEALS  Dispense: 180 tablet; Refill: 1 - simvastatin (ZOCOR) 40 MG tablet; Take 1 tablet (40 mg total) by mouth daily.  Dispense: 90 tablet; Refill: 1  3. Gastroesophageal reflux disease without esophagitis Chronic.  Controlled.  Table.  Continue olmesartan 40 mg once a day. - omeprazole (PRILOSEC) 40 MG capsule; TAKE 1 CAPSULE BY MOUTH DAILY  Dispense: 90 capsule; Refill: 1  4. 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  Controlled.  Stable.  Continue Lyrica 200 mg 1 capsule twice a day - pregabalin (LYRICA) 200 MG capsule; Take 1 capsule (200 mg total)  by mouth 2 (two) times daily.  Dispense: 60 capsule; Refill: 3  5. Idiopathic peripheral neuropathy Chronic.  Controlled.  Stable.  Continue Lyrica 200 mg twice a day. - pregabalin (LYRICA) 200 MG capsule; Take 1 capsule (200 mg total) by mouth 2 (two) times daily.  Dispense: 60 capsule; Refill: 3  6. 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  Controlled.  Stable.  PHQ is 0 Gad score is 0 continue sertraline 50 mg 1/2 tablet for total dosing of 25 mg once a day. - sertraline (ZOLOFT) 50 MG tablet; Take 1/2 tablet daily  Dispense: 45 tablet; Refill: 1  7. Abrasion Occasional skin tears/abrasions  necessitating use of Bactroban 2% apply twice a day as needed. - mupirocin ointment (BACTROBAN) 2 %; Apply 1 Application topically 2 (two) times daily. Apply to effected area bid  Dispense: 30 g; Refill: 1  8. Seasonal allergic rhinitis due to pollen Chronic.  Controlled.  Stable.  Continue loratadine  9. Need for immunization against influenza Discussed and administered. - Flu Vaccine QUAD High Dose(Fluad)    Otilio Miu, MD

## 2022-07-12 ENCOUNTER — Other Ambulatory Visit: Payer: Self-pay | Admitting: Family Medicine

## 2022-07-12 NOTE — Telephone Encounter (Signed)
Requested medication (s) are due for refill today - yes  Requested medication (s) are on the active medication list -yes  Future visit scheduled -yes  Last refill: 06/14/22 #120  Notes to clinic: non delegated Rx  Requested Prescriptions  Pending Prescriptions Disp Refills   traMADol (ULTRAM) 50 MG tablet 120 tablet 0    Sig: TAKE 1 TABLET(50 MG) BY MOUTH EVERY 6 HOURS AS NEEDED     Not Delegated - Analgesics:  Opioid Agonists Failed - 07/12/2022 11:07 AM      Failed - This refill cannot be delegated      Failed - Urine Drug Screen completed in last 360 days      Passed - Valid encounter within last 3 months    Recent Outpatient Visits           1 week ago Essential hypertension   [facility] [place], [place], MD   4 months ago Pneumonia of lower lobe due to infectious organism, unspecified laterality   [facility] [place], Deanna C, MD   6 months ago Essential hypertension   [facility] [place], [place], MD   9 months ago Hospital discharge follow-up   [facility] [place], Deanna C, MD   1 year ago Mixed hyperlipidemia   [facility] [place], [place], MD       Future Appointments             In 5 months Juline Patch, MD [facility] at [facility]               Requested Prescriptions  Pending Prescriptions Disp Refills   traMADol (ULTRAM) 50 MG tablet 120 tablet 0    Sig: TAKE 1 TABLET(50 MG) BY MOUTH EVERY 6 HOURS AS NEEDED     Not Delegated - Analgesics:  Opioid Agonists Failed - 07/12/2022 11:07 AM      Failed - This refill cannot be delegated      Failed - Urine Drug Screen completed in last 360 days      Passed - Valid encounter within last 3 months     Recent Outpatient Visits           1 week ago Essential hypertension   [facility] [place], [place], MD   4 months ago Pneumonia of lower lobe due to infectious organism, unspecified laterality   [facility] [place], Deanna C, MD   6 months ago Essential hypertension   [facility] [place], [place], MD   9 months ago Hospital discharge follow-up   [facility] at [place], Deanna C, MD   1 year ago Mixed hyperlipidemia   [facility] [place], [place], MD       Future Appointments             In 5 months Juline Patch, MD [facility] at [facility], [facility]

## 2022-07-12 NOTE — Telephone Encounter (Signed)
Medication Refill - Medication: traMADol (ULTRAM) 50 MG tablet [[id number]]  Has the patient contacted their pharmacy? Yes.   (Agent: If no, request that the patient contact the pharmacy for the refill. If patient does not wish to contact the pharmacy document the reason why and proceed with request.) (Agent: If yes, when and what did the pharmacy advise?)  Preferred Pharmacy (with phone number or street name):  [facility], [address] AT [place]  [place] [address]  Phone: [phone] Fax: [phone]  Hours: Not open 24 hours   Has the patient been seen for an appointment in the last year OR does the patient have an upcoming appointment? Yes.    Agent: Please be advised that RX refills may take up to 3 business days. We ask that you follow-up with your pharmacy.

## 2022-07-14 ENCOUNTER — Inpatient Hospital Stay: Payer: Medicare Other

## 2022-07-14 VITALS — BP 139/57 | HR 68 | Temp 96.4°F | Resp 20

## 2022-07-14 DIAGNOSIS — N1831 Chronic kidney disease, stage 3a: Secondary | ICD-10-CM | POA: Diagnosis not present

## 2022-07-14 DIAGNOSIS — D500 Iron deficiency anemia secondary to blood loss (chronic): Secondary | ICD-10-CM | POA: Diagnosis not present

## 2022-07-14 DIAGNOSIS — D631 Anemia in chronic kidney disease: Secondary | ICD-10-CM | POA: Diagnosis not present

## 2022-07-14 MED ORDER — SODIUM CHLORIDE 0.9 % IV SOLN
INTRAVENOUS | Status: DC
  Filled 2022-07-14: qty 250

## 2022-07-14 MED ORDER — SODIUM CHLORIDE 0.9 % IV SOLN
200.0000 mg | Freq: Once | INTRAVENOUS | Status: AC
  Administered 2022-07-14: 200 mg via INTRAVENOUS
  Filled 2022-07-14: qty 200

## 2022-07-15 ENCOUNTER — Other Ambulatory Visit: Payer: Self-pay | Admitting: Neurology

## 2022-07-15 NOTE — Telephone Encounter (Signed)
Verify Drug Registry For Tramadol Hcl 50 Mg Tablet Last Filled: 06/14/2022 Quantity: 120 tablets for 30 days Last appointment: 05/04/2022 Next appointment: 05/03/2023

## 2022-07-21 DIAGNOSIS — B351 Tinea unguium: Secondary | ICD-10-CM | POA: Diagnosis not present

## 2022-07-21 DIAGNOSIS — E1142 Type 2 diabetes mellitus with diabetic polyneuropathy: Secondary | ICD-10-CM | POA: Diagnosis not present

## 2022-07-28 ENCOUNTER — Inpatient Hospital Stay: Payer: Medicare Other | Attending: Oncology

## 2022-07-28 VITALS — BP 143/61 | HR 71 | Temp 97.1°F | Resp 18

## 2022-07-28 DIAGNOSIS — D500 Iron deficiency anemia secondary to blood loss (chronic): Secondary | ICD-10-CM | POA: Diagnosis not present

## 2022-07-28 DIAGNOSIS — D631 Anemia in chronic kidney disease: Secondary | ICD-10-CM | POA: Diagnosis not present

## 2022-07-28 DIAGNOSIS — N1831 Chronic kidney disease, stage 3a: Secondary | ICD-10-CM | POA: Diagnosis not present

## 2022-07-28 MED ORDER — SODIUM CHLORIDE 0.9 % IV SOLN
200.0000 mg | Freq: Once | INTRAVENOUS | Status: AC
  Administered 2022-07-28: 200 mg via INTRAVENOUS
  Filled 2022-07-28: qty 200

## 2022-07-28 MED ORDER — SODIUM CHLORIDE 0.9 % IV SOLN
Freq: Once | INTRAVENOUS | Status: AC
  Filled 2022-07-28: qty 250

## 2022-08-14 ENCOUNTER — Other Ambulatory Visit: Payer: Self-pay | Admitting: Neurology

## 2022-08-17 NOTE — Telephone Encounter (Signed)
Pt's wife called stating that the pt is needing a refill on his traMADol (ULTRAM) 50 MG tablet

## 2022-08-17 NOTE — Telephone Encounter (Signed)
Verify Drug Registry For Tramadol Hcl 50 Mg Tablet Last Filled: 07/15/2022 Quantity: 120 tablets for 30 days Last appointment: 05/04/2022 Next appointment: 05/03/2023

## 2022-08-18 ENCOUNTER — Inpatient Hospital Stay: Payer: Medicare Other

## 2022-08-18 ENCOUNTER — Emergency Department: Payer: Medicare Other

## 2022-08-18 ENCOUNTER — Encounter: Payer: Self-pay | Admitting: Internal Medicine

## 2022-08-18 ENCOUNTER — Other Ambulatory Visit: Payer: Self-pay

## 2022-08-18 ENCOUNTER — Observation Stay
Admission: EM | Admit: 2022-08-18 | Discharge: 2022-08-19 | Disposition: A | Discharge status: Home Health | Payer: Medicare Other | Attending: Hospitalist | Admitting: Hospitalist

## 2022-08-18 DIAGNOSIS — Z79899 Other long term (current) drug therapy: Secondary | ICD-10-CM | POA: Insufficient documentation

## 2022-08-18 DIAGNOSIS — Z1152 Encounter for screening for COVID-19: Secondary | ICD-10-CM | POA: Diagnosis not present

## 2022-08-18 DIAGNOSIS — E119 Type 2 diabetes mellitus without complications: Secondary | ICD-10-CM

## 2022-08-18 DIAGNOSIS — Z87891 Personal history of nicotine dependence: Secondary | ICD-10-CM | POA: Diagnosis not present

## 2022-08-18 DIAGNOSIS — R531 Weakness: Secondary | ICD-10-CM | POA: Diagnosis not present

## 2022-08-18 DIAGNOSIS — R42 Dizziness and giddiness: Secondary | ICD-10-CM | POA: Diagnosis not present

## 2022-08-18 DIAGNOSIS — R601 Generalized edema: Secondary | ICD-10-CM

## 2022-08-18 DIAGNOSIS — N1831 Chronic kidney disease, stage 3a: Secondary | ICD-10-CM | POA: Insufficient documentation

## 2022-08-18 DIAGNOSIS — J9621 Acute and chronic respiratory failure with hypoxia: Secondary | ICD-10-CM | POA: Insufficient documentation

## 2022-08-18 DIAGNOSIS — Z794 Long term (current) use of insulin: Secondary | ICD-10-CM | POA: Insufficient documentation

## 2022-08-18 DIAGNOSIS — E1122 Type 2 diabetes mellitus with diabetic chronic kidney disease: Secondary | ICD-10-CM | POA: Diagnosis not present

## 2022-08-18 DIAGNOSIS — D631 Anemia in chronic kidney disease: Secondary | ICD-10-CM | POA: Diagnosis present

## 2022-08-18 DIAGNOSIS — Z7984 Long term (current) use of oral hypoglycemic drugs: Secondary | ICD-10-CM | POA: Diagnosis not present

## 2022-08-18 DIAGNOSIS — R911 Solitary pulmonary nodule: Secondary | ICD-10-CM | POA: Diagnosis not present

## 2022-08-18 DIAGNOSIS — G35 Multiple sclerosis: Secondary | ICD-10-CM | POA: Diagnosis present

## 2022-08-18 DIAGNOSIS — G35D Multiple sclerosis, unspecified: Secondary | ICD-10-CM | POA: Diagnosis present

## 2022-08-18 DIAGNOSIS — N179 Acute kidney failure, unspecified: Secondary | ICD-10-CM | POA: Diagnosis not present

## 2022-08-18 DIAGNOSIS — Z7982 Long term (current) use of aspirin: Secondary | ICD-10-CM | POA: Diagnosis not present

## 2022-08-18 DIAGNOSIS — R0602 Shortness of breath: Secondary | ICD-10-CM | POA: Diagnosis not present

## 2022-08-18 DIAGNOSIS — E1142 Type 2 diabetes mellitus with diabetic polyneuropathy: Secondary | ICD-10-CM | POA: Diagnosis not present

## 2022-08-18 DIAGNOSIS — I959 Hypotension, unspecified: Secondary | ICD-10-CM | POA: Diagnosis not present

## 2022-08-18 DIAGNOSIS — J449 Chronic obstructive pulmonary disease, unspecified: Secondary | ICD-10-CM | POA: Diagnosis not present

## 2022-08-18 DIAGNOSIS — I10 Essential (primary) hypertension: Secondary | ICD-10-CM

## 2022-08-18 DIAGNOSIS — D649 Anemia, unspecified: Secondary | ICD-10-CM | POA: Diagnosis present

## 2022-08-18 DIAGNOSIS — K219 Gastro-esophageal reflux disease without esophagitis: Secondary | ICD-10-CM | POA: Diagnosis present

## 2022-08-18 DIAGNOSIS — F32A Depression, unspecified: Secondary | ICD-10-CM | POA: Diagnosis present

## 2022-08-18 LAB — URINALYSIS, COMPLETE (UACMP) WITH MICROSCOPIC
Bacteria, UA: NONE SEEN
Bilirubin Urine: NEGATIVE
Glucose, UA: NEGATIVE mg/dL
Ketones, ur: NEGATIVE mg/dL
Leukocytes,Ua: NEGATIVE
Nitrite: NEGATIVE
Protein, ur: NEGATIVE mg/dL
Specific Gravity, Urine: 1.017 (ref 1.005–1.030)
pH: 5 (ref 5.0–8.0)

## 2022-08-18 LAB — COMPREHENSIVE METABOLIC PANEL
ALT: 10 U/L (ref 0–44)
AST: 20 U/L (ref 15–41)
Albumin: 3 g/dL — ABNORMAL LOW (ref 3.5–5.0)
Alkaline Phosphatase: 61 U/L (ref 38–126)
Anion gap: 7 (ref 5–15)
BUN: 64 mg/dL — ABNORMAL HIGH (ref 8–23)
CO2: 21 mmol/L — ABNORMAL LOW (ref 22–32)
Calcium: 8.6 mg/dL — ABNORMAL LOW (ref 8.9–10.3)
Chloride: 109 mmol/L (ref 98–111)
Creatinine, Ser: 2.07 mg/dL — ABNORMAL HIGH (ref 0.61–1.24)
GFR, Estimated: 34 mL/min — ABNORMAL LOW (ref >60)
Glucose, Bld: 143 mg/dL — ABNORMAL HIGH (ref 70–99)
Potassium: 5.1 mmol/L (ref 3.5–5.1)
Sodium: 137 mmol/L (ref 135–145)
Total Bilirubin: 0.5 mg/dL (ref 0.3–1.2)
Total Protein: 7.1 g/dL (ref 6.5–8.1)

## 2022-08-18 LAB — AMMONIA: Ammonia: 47 umol/L — ABNORMAL HIGH (ref 9–35)

## 2022-08-18 LAB — BLOOD GAS, VENOUS
Acid-base deficit: 3.1 mmol/L — ABNORMAL HIGH (ref 0.0–2.0)
Bicarbonate: 21.2 mmol/L (ref 20.0–28.0)
O2 Content: 2 L/min
O2 Saturation: 49.9 %
Patient temperature: 37
pCO2, Ven: 35 mmHg — ABNORMAL LOW (ref 44–60)
pH, Ven: 7.39 (ref 7.25–7.43)
pO2, Ven: 34 mmHg (ref 32–45)

## 2022-08-18 LAB — CBC WITH DIFFERENTIAL/PLATELET
Abs Immature Granulocytes: 0.06 10*3/uL (ref 0.00–0.07)
Basophils Absolute: 0.1 10*3/uL (ref 0.0–0.1)
Basophils Relative: 1 %
Eosinophils Absolute: 0.1 10*3/uL (ref 0.0–0.5)
Eosinophils Relative: 1 %
HCT: 27.3 % — ABNORMAL LOW (ref 39.0–52.0)
Hemoglobin: 8.3 g/dL — ABNORMAL LOW (ref 13.0–17.0)
Immature Granulocytes: 1 %
Lymphocytes Relative: 5 %
Lymphs Abs: 0.5 10*3/uL — ABNORMAL LOW (ref 0.7–4.0)
MCH: 29.4 pg (ref 26.0–34.0)
MCHC: 30.4 g/dL (ref 30.0–36.0)
MCV: 96.8 fL (ref 80.0–100.0)
Monocytes Absolute: 0.8 10*3/uL (ref 0.1–1.0)
Monocytes Relative: 7 %
Neutro Abs: 9.1 10*3/uL — ABNORMAL HIGH (ref 1.7–7.7)
Neutrophils Relative %: 85 %
Platelets: 195 10*3/uL (ref 150–400)
RBC: 2.82 MIL/uL — ABNORMAL LOW (ref 4.22–5.81)
RDW: 15.1 % (ref 11.5–15.5)
WBC: 10.5 10*3/uL (ref 4.0–10.5)
nRBC: 0 % (ref 0.0–0.2)

## 2022-08-18 LAB — LACTIC ACID, PLASMA
Lactic Acid, Venous: 1.1 mmol/L (ref 0.5–1.9)
Lactic Acid, Venous: 1.1 mmol/L (ref 0.5–1.9)

## 2022-08-18 LAB — PROTIME-INR
INR: 1.3 — ABNORMAL HIGH (ref 0.8–1.2)
Prothrombin Time: 16 seconds — ABNORMAL HIGH (ref 11.4–15.2)

## 2022-08-18 LAB — RESP PANEL BY RT-PCR (FLU A&B, COVID) ARPGX2
Influenza A by PCR: NEGATIVE
Influenza B by PCR: NEGATIVE
SARS Coronavirus 2 by RT PCR: NEGATIVE

## 2022-08-18 LAB — APTT: aPTT: 41 seconds — ABNORMAL HIGH (ref 24–36)

## 2022-08-18 LAB — GLUCOSE, CAPILLARY: Glucose-Capillary: 105 mg/dL — ABNORMAL HIGH (ref 70–99)

## 2022-08-18 LAB — CK: Total CK: 87 U/L (ref 49–397)

## 2022-08-18 LAB — CBG MONITORING, ED: Glucose-Capillary: 133 mg/dL — ABNORMAL HIGH (ref 70–99)

## 2022-08-18 MED ORDER — SODIUM CHLORIDE 0.9 % IV SOLN: INTRAVENOUS | Status: AC

## 2022-08-18 MED ORDER — ONDANSETRON HCL 4 MG/2ML IJ SOLN: 4.0000 mg | Freq: Four times a day (QID) | INTRAMUSCULAR | Status: DC | PRN

## 2022-08-18 MED ORDER — PREGABALIN 75 MG PO CAPS
200.0000 mg | ORAL_CAPSULE | Freq: Two times a day (BID) | ORAL | Status: DC
  Administered 2022-08-18 – 2022-08-19 (×2): 200 mg via ORAL
  Filled 2022-08-18 (×2): qty 1

## 2022-08-18 MED ORDER — LACTATED RINGERS IV BOLUS
500.0000 mL | Freq: Once | INTRAVENOUS | Status: AC
  Administered 2022-08-18: 500 mL via INTRAVENOUS

## 2022-08-18 MED ORDER — ACETAMINOPHEN 650 MG RE SUPP: 650.0000 mg | Freq: Four times a day (QID) | RECTAL | Status: DC | PRN

## 2022-08-18 MED ORDER — SODIUM CHLORIDE 0.9 % IV BOLUS
500.0000 mL | Freq: Once | INTRAVENOUS | Status: AC
  Administered 2022-08-18: 500 mL via INTRAVENOUS

## 2022-08-18 MED ORDER — GEMFIBROZIL 600 MG PO TABS
600.0000 mg | ORAL_TABLET | Freq: Two times a day (BID) | ORAL | Status: DC
  Filled 2022-08-18: qty 1

## 2022-08-18 MED ORDER — INTERFERON BETA-1B 0.3 MG ~~LOC~~ KIT: 0.2500 mg | PACK | SUBCUTANEOUS | Status: DC

## 2022-08-18 MED ORDER — IPRATROPIUM-ALBUTEROL 0.5-2.5 (3) MG/3ML IN SOLN
3.0000 mL | Freq: Once | RESPIRATORY_TRACT | Status: AC
  Administered 2022-08-18: 3 mL via RESPIRATORY_TRACT
  Filled 2022-08-18: qty 3

## 2022-08-18 MED ORDER — ACETAMINOPHEN 325 MG PO TABS
650.0000 mg | ORAL_TABLET | Freq: Four times a day (QID) | ORAL | Status: DC | PRN
  Administered 2022-08-18: 650 mg via ORAL
  Filled 2022-08-18: qty 2

## 2022-08-18 MED ORDER — FERROUS SULFATE 325 (65 FE) MG PO TABS
325.0000 mg | ORAL_TABLET | Freq: Every day | ORAL | Status: DC
  Administered 2022-08-19: 325 mg via ORAL
  Filled 2022-08-18: qty 1

## 2022-08-18 MED ORDER — IPRATROPIUM-ALBUTEROL 0.5-2.5 (3) MG/3ML IN SOLN: 3.0000 mL | Freq: Four times a day (QID) | RESPIRATORY_TRACT | Status: DC | PRN

## 2022-08-18 MED ORDER — FENOFIBRATE 54 MG PO TABS
54.0000 mg | ORAL_TABLET | Freq: Every day | ORAL | Status: DC
  Administered 2022-08-19: 54 mg via ORAL
  Filled 2022-08-18 (×2): qty 1

## 2022-08-18 MED ORDER — INSULIN ASPART 100 UNIT/ML IJ SOLN
0.0000 [IU] | Freq: Three times a day (TID) | INTRAMUSCULAR | Status: DC
  Administered 2022-08-19: 2 [IU] via SUBCUTANEOUS
  Filled 2022-08-18: qty 1

## 2022-08-18 MED ORDER — SODIUM CHLORIDE 0.9 % IV SOLN
500.0000 mg | INTRAVENOUS | Status: DC
  Administered 2022-08-18: 500 mg via INTRAVENOUS
  Filled 2022-08-18 (×2): qty 5

## 2022-08-18 MED ORDER — INSULIN GLARGINE-YFGN 100 UNIT/ML ~~LOC~~ SOLN
15.0000 [IU] | Freq: Every day | SUBCUTANEOUS | Status: DC
  Administered 2022-08-18: 15 [IU] via SUBCUTANEOUS
  Filled 2022-08-18 (×2): qty 0.15

## 2022-08-18 MED ORDER — LACTATED RINGERS IV SOLN: INTRAVENOUS | Status: DC

## 2022-08-18 MED ORDER — ONDANSETRON HCL 4 MG PO TABS: 4.0000 mg | ORAL_TABLET | Freq: Four times a day (QID) | ORAL | Status: DC | PRN

## 2022-08-18 MED ORDER — ENOXAPARIN SODIUM 40 MG/0.4ML IJ SOSY
40.0000 mg | PREFILLED_SYRINGE | INTRAMUSCULAR | Status: DC
  Administered 2022-08-18: 40 mg via SUBCUTANEOUS
  Filled 2022-08-18: qty 0.4

## 2022-08-18 MED ORDER — SODIUM CHLORIDE 0.9 % IV SOLN
2.0000 g | INTRAVENOUS | Status: DC
  Administered 2022-08-18: 2 g via INTRAVENOUS
  Filled 2022-08-18 (×2): qty 20

## 2022-08-18 MED ORDER — PANTOPRAZOLE SODIUM 40 MG PO TBEC
40.0000 mg | DELAYED_RELEASE_TABLET | Freq: Every day | ORAL | Status: DC
  Administered 2022-08-18 – 2022-08-19 (×2): 40 mg via ORAL
  Filled 2022-08-18 (×2): qty 1

## 2022-08-18 MED ORDER — ASPIRIN 81 MG PO TBEC
81.0000 mg | DELAYED_RELEASE_TABLET | Freq: Every day | ORAL | Status: DC
  Administered 2022-08-18 – 2022-08-19 (×2): 81 mg via ORAL
  Filled 2022-08-18 (×2): qty 1

## 2022-08-18 MED ORDER — TRAMADOL HCL 50 MG PO TABS
50.0000 mg | ORAL_TABLET | Freq: Four times a day (QID) | ORAL | Status: DC | PRN
  Administered 2022-08-18 – 2022-08-19 (×2): 50 mg via ORAL
  Filled 2022-08-18 (×2): qty 1

## 2022-08-18 MED ORDER — SODIUM BICARBONATE 650 MG PO TABS
650.0000 mg | ORAL_TABLET | Freq: Two times a day (BID) | ORAL | Status: DC
  Administered 2022-08-18 – 2022-08-19 (×3): 650 mg via ORAL
  Filled 2022-08-18 (×4): qty 1

## 2022-08-18 MED ORDER — LORATADINE 10 MG PO TABS
10.0000 mg | ORAL_TABLET | Freq: Every day | ORAL | Status: DC
  Administered 2022-08-18 – 2022-08-19 (×2): 10 mg via ORAL
  Filled 2022-08-18 (×2): qty 1

## 2022-08-18 MED ORDER — SIMVASTATIN 20 MG PO TABS
40.0000 mg | ORAL_TABLET | Freq: Every day | ORAL | Status: DC
  Administered 2022-08-18: 40 mg via ORAL
  Filled 2022-08-18: qty 2

## 2022-08-18 MED ORDER — SERTRALINE HCL 50 MG PO TABS
25.0000 mg | ORAL_TABLET | Freq: Every day | ORAL | Status: DC
  Administered 2022-08-18 – 2022-08-19 (×2): 25 mg via ORAL
  Filled 2022-08-18 (×2): qty 1

## 2022-08-18 NOTE — Assessment & Plan Note (Signed)
Continue sertraline 

## 2022-08-18 NOTE — Assessment & Plan Note (Signed)
Chronic and stable Continue IV iron as an outpatient

## 2022-08-18 NOTE — H&P (Signed)
History and Physical    Patient: Jeremy [name]:[id number] DOB: 1951/07/25 DOA: 08/18/2022 DOS: the patient was seen and examined on 08/18/2022 PCP: Juline Patch, MD  Patient coming from: Home  Chief Complaint:  Chief Complaint  Patient presents with   Weakness    Progressively weak over the last week. Hx of MS.    HPI: Jeremy [name] is a 71 y.o. male with medical history significant for diabetes mellitus with complications of stage IIIa chronic kidney disease, hypertension, history of multiple sclerosis, COPD on 2 L of oxygen as needed during the day and at night, iron deficiency anemia receiving iron infusion who presents to the ER via EMS for evaluation of weakness. According to his wife he usually ambulates with a rolling walker but over the last several days has become increasingly weak and has difficulty getting around.  He has also been using his oxygen continuously around-the-clock and is currently on 3 L. He denies having a cough, denies having any fever or chills, denies having any dysphagia or odynophagia, no chest pain, no changes in his bowel habits, no urinary symptoms, no headache, no dizziness, no lightheadedness, no blurred vision no focal deficit. His wife he slid out of his chair about 4 days ago and his son-in-law had to help assist him back into his recliner. Labs show worsening renal function from his baseline, at baseline serum creatinine is 1.4 today on admission it is 2.07. He had a chest x-ray which showed increased prominence of interstitial opacities, which may represent pulmonary edema or atypical infection. He received a dose of Rocephin and Zithromax and will be admitted to the hospital for further evaluation.   Review of Systems: As mentioned in the history of present illness. All other systems reviewed and are negative. Past Medical History:  Diagnosis Date   Chronic pain    Depression    Diabetes ([place])    GERD (gastroesophageal reflux disease)     Hyperlipemia    Hypertension    MS (multiple sclerosis) ([employer])    Past Surgical History:  Procedure Laterality Date   COLECTOMY  06-2008   COLONOSCOPY  2015   normal   COLONOSCOPY WITH PROPOFOL N/A 09/10/2021   Procedure: COLONOSCOPY WITH PROPOFOL;  Surgeon: [name], MD;  Location: [facility] ENDOSCOPY;  Service: Endoscopy;  Laterality: N/A;   ESOPHAGOGASTRODUODENOSCOPY (EGD) WITH PROPOFOL N/A 09/10/2021   Procedure: ESOPHAGOGASTRODUODENOSCOPY (EGD) WITH PROPOFOL;  Surgeon: [name], MD;  Location: [facility] ENDOSCOPY;  Service: Endoscopy;  Laterality: N/A;   GIVENS CAPSULE STUDY N/A 09/25/2021   Procedure: GIVENS CAPSULE STUDY;  Surgeon: Jonathon Bellows, MD;  Location: [facility] ENDOSCOPY;  Service: Gastroenterology;  Laterality: N/A;   Social History:  reports that he quit smoking about 16 years ago. His smoking use included cigarettes. He has a 45.00 pack-year smoking history. He has never used smokeless tobacco. He reports that he does not currently use alcohol. He reports that he does not use drugs.  Allergies  Allergen Reactions   Betadine [Povidone Iodine]     Family History  Problem Relation Age of Onset   Lung cancer Mother    Heart attack Father    Heart attack Brother    COPD Brother    Diabetes Brother    Esophageal cancer Nephew     Prior to Admission medications   Medication Sig Start Date End Date Taking? Authorizing Provider  albuterol (VENTOLIN HFA) 108 (90 Base) MCG/ACT inhaler Inhale 2 puffs into the lungs in the morning, at noon, in  the evening, and at bedtime.    [provider]  aspirin 81 MG tablet Take 81 mg by mouth daily.    [provider]  B-D INSULIN SYRINGE 1CC/25GX1" 25G X 1" 1 ML MISC USE AS DIRECTED 06/22/21   Juline Patch, MD  ferrous sulfate 325 (65 FE) MG tablet Take 325 mg by mouth daily with breakfast.    [provider]  furosemide (LASIX) 40 MG tablet Take 0.5 tablets (20 mg total) by mouth every other day. 02/05/22   Jennye Boroughs, MD  gemfibrozil (LOPID) 600 MG tablet TAKE 1 TABLET BY MOUTH TWICE  DAILY BEFORE MEALS 07/02/22   Juline Patch, MD  Insulin Glargine (LANTUS) 100 UNIT/ML Solostar Pen Inject 32 Units into the skin at bedtime.    [provider]  Interferon Beta-1b (BETASERON) 0.3 MG KIT injection Inject subcutaneously 1 syringe every other day. 05/06/20   Marcial Pacas, MD  loratadine (CLARITIN) 10 MG tablet Take 1 tablet (10 mg total) by mouth daily. 07/02/22   Juline Patch, MD  losartan (COZAAR) 25 MG tablet Take 1 tablet (25 mg total) by mouth daily. singh 07/02/22   Juline Patch, MD  metFORMIN (GLUCOPHAGE) 500 MG tablet Take 500 mg by mouth 2 (two) times daily.    [provider]  mupirocin ointment (BACTROBAN) 2 % Apply 1 Application topically 2 (two) times daily. Apply to effected area bid 07/02/22   Juline Patch, MD  omeprazole (PRILOSEC) 40 MG capsule TAKE 1 CAPSULE BY MOUTH DAILY 07/02/22   Juline Patch, MD  pregabalin (LYRICA) 200 MG capsule Take 1 capsule (200 mg total) by mouth 2 (two) times daily. 07/02/22   Juline Patch, MD  sertraline (ZOLOFT) 50 MG tablet Take 1/2 tablet daily 07/02/22   Juline Patch, MD  simvastatin (ZOCOR) 40 MG tablet Take 1 tablet (40 mg total) by mouth daily. 07/02/22   Juline Patch, MD  sodium bicarbonate 650 MG tablet Take 650 mg by mouth 2 (two) times daily. 06/10/21   [provider]  traMADol (ULTRAM) 50 MG tablet TAKE 1 TABLET(50 MG) BY MOUTH EVERY 6 HOURS AS NEEDED 08/17/22   Suzzanne Cloud, NP    Physical Exam: Vitals:   08/18/22 1140 08/18/22 1142  BP: (!) 117/47   Pulse: 79   Resp: 16   Temp: 98.7 F (37.1 C)   SpO2: 98%   Weight:  97.5 kg   Physical Exam Vitals and nursing note reviewed.  Constitutional:      Comments: Chronically ill-appearing  HENT:     Head: Normocephalic and atraumatic.     Nose: Nose normal.     Mouth/Throat:     Mouth: Mucous membranes are moist.  Eyes:     Comments: Pale conjunctiva   Cardiovascular:     Rate and Rhythm: Normal rate and regular rhythm.  Pulmonary:     Effort: Pulmonary effort is normal.     Breath sounds: Normal breath sounds.  Abdominal:     General: Abdomen is flat. Bowel sounds are normal.     Palpations: Abdomen is soft.  Musculoskeletal:        General: Normal range of motion.     Cervical back: Normal range of motion and neck supple.  Skin:    General: Skin is warm and dry.  Neurological:     Mental Status: He is alert.     Motor: Weakness present.  Psychiatric:  Mood and Affect: Mood normal.        Behavior: Behavior normal.     Data Reviewed: Relevant notes from primary care and specialist visits, past discharge summaries as available in EHR, including Care Everywhere. Prior diagnostic testing as pertinent to current admission diagnoses Updated medications and problem lists for reconciliation ED course, including vitals, labs, imaging, treatment and response to treatment Triage notes, nursing and pharmacy notes and ED provider's notes Notable results as noted in HPI Labs reviewed.  Sodium 137, potassium 5.1, chloride 109, bicarb 21, glucose 143, BUN 64, creatinine 2.07 above a baseline of 1.4, calcium 8.6, total protein 7.1, albumin 3.0, AST 20, ALT 10, alk phos 61, total bili 0.5, PT 16.0, INR 1.3, lactic acid 1.1, white count 10.5, hemoglobin 8.3, hematocrit 27.3, platelet count 195. Respiratory viral panel is negative CT scan of the head without contrast shows no acute intracranial findings. Chronic small vessel ischemic changes. Chest x-ray reviewed by me shows increased prominence of interstitial opacities, which may represent pulmonary edema or atypical infection. Similar right upper lobe nodule. Twelve-lead EKG reviewed by me shows sinus rhythm.  Left axis deviation.  Low voltage QRS.  Prolonged PR interval There are no new results to review at this time.  Assessment and Plan: * Weakness Patient with a history of  multiple sclerosis who presents to the ER for evaluation of generalized weakness which has worsened. ??  Worsening weakness related to known MS Continue interferon beta We will consult neurology for further recommendation if there is no infectious process to explain patient's underlying weakness.  COPD (chronic obstructive pulmonary disease) (HCC) With chronic respiratory failure, at baseline on 2 L of oxygen as needed during the day and at night currently requiring 3 L continuous to maintain pulse oximetry greater than 92% Continue as needed bronchodilator therapy Add inhaled steroids  AKI (acute kidney injury) ([place]) At baseline patient has a serum creatinine of 1.4 and today on admission it is 2.07 It is unclear etiology of patient's worsening renal function Hold losartan and Furosemide Gentle IV fluid hydration Repeat renal parameters in a.m.  Anemia in stage 3a chronic kidney disease (HCC) Chronic and stable Continue IV iron as an outpatient  Type 2 diabetes mellitus with diabetic polyneuropathy, with long-term current use of insulin ([place]) Patient has insulin-dependent type 2 diabetes mellitus Continue Levemir but decrease dose due to patient's worsening renal function Maintain consistent carbohydrate diet Check blood sugars AC and HS  Gastroesophageal reflux disease without esophagitis Continue PPI  Depression Continue sertraline  Multiple sclerosis (HCC) Continue interferon beta      Advance Care Planning:   Code Status: DNR   Consults: Neurology, PT  Family Communication: Greater than 50% of time was spent discussing patient's condition and plan of care with patient and his wife at the bedside.  All questions and concerns have been addressed.  They verbalized understanding and agree with the plan.  Severity of Illness: The appropriate patient status for this patient is INPATIENT. Inpatient status is judged to be reasonable and necessary in order to provide the  required intensity of service to ensure the patient's safety. The patient's presenting symptoms, physical exam findings, and initial radiographic and laboratory data in the context of their chronic comorbidities is felt to place them at high risk for further clinical deterioration. Furthermore, it is not anticipated that the patient will be medically stable for discharge from the hospital within 2 midnights of admission.   * I certify that at the point  of admission it is my clinical judgment that the patient will require inpatient hospital care spanning beyond 2 midnights from the point of admission due to high intensity of service, high risk for further deterioration and high frequency of surveillance required.*  Author: Collier Bullock, MD 08/18/2022 2:48 PM  For on call review [email or web address].

## 2022-08-18 NOTE — ED Triage Notes (Signed)
Pt arrived by EMS for increased weakness over the last week at home. Not usually on home O2 unless he leaves home. Now requires 3L [place]. Pt alert and oriented states could possibly have had some fevers in the last week.

## 2022-08-18 NOTE — Assessment & Plan Note (Signed)
Patient with a history of multiple sclerosis who presents to the ER for evaluation of generalized weakness which has worsened. ??  Worsening weakness related to known MS Continue interferon beta We will consult neurology for further recommendation if there is no infectious process to explain patient's underlying weakness.

## 2022-08-18 NOTE — Assessment & Plan Note (Signed)
Patient has insulin-dependent type 2 diabetes mellitus Continue Levemir but decrease dose due to patient's worsening renal function Maintain consistent carbohydrate diet Check blood sugars AC and HS

## 2022-08-18 NOTE — Assessment & Plan Note (Signed)
Continue PPI ?

## 2022-08-18 NOTE — Assessment & Plan Note (Signed)
Continue interferon beta

## 2022-08-18 NOTE — Assessment & Plan Note (Signed)
With chronic respiratory failure, at baseline on 2 L of oxygen as needed during the day and at night currently requiring 3 L continuous to maintain pulse oximetry greater than 92% Continue as needed bronchodilator therapy Add inhaled steroids

## 2022-08-18 NOTE — Assessment & Plan Note (Signed)
At baseline patient has a serum creatinine of 1.4 and today on admission it is 2.07 It is unclear etiology of patient's worsening renal function Hold losartan and Furosemide Gentle IV fluid hydration Repeat renal parameters in a.m.

## 2022-08-18 NOTE — ED Notes (Signed)
MD advised Map is low, bolus ordered and given per [facility].

## 2022-08-18 NOTE — ED Provider Notes (Signed)
[facility] Provider Note    Event Date/Time   First MD Initiated Contact with Patient 08/18/22 1145     (approximate)   History   Weakness (Progressively weak over the last week. Hx of MS. )   HPI  Jeremy [name] is a 71 y.o. male presents to the ER for evaluation of worsening weakness and shortness of breath over the past week.  Has had some shortness of breath.  Denies any chest pain no nausea or vomiting.  Has a history of MS.  Wife states that he acted like this previously when he was diagnosed with pneumonia.  Denies any abdominal pain.  No diarrhea.  No dysuria.     Physical Exam   Triage Vital Signs: ED Triage Vitals  Enc Vitals Group     BP 08/18/22 1140 (!) 117/47     Pulse Rate 08/18/22 1140 79     Resp 08/18/22 1140 16     Temp 08/18/22 1140 98.7 F (37.1 C)     Temp src --      SpO2 08/18/22 1140 98 %     Weight 08/18/22 1142 214 lb 15.2 oz (97.5 kg)     Height --      Head Circumference --      Peak Flow --      Pain Score 08/18/22 1142 0     Pain Loc --      Pain Edu? --      Excl. in [place]? --     Most recent vital signs: Vitals:   08/18/22 1140  BP: (!) 117/47  Pulse: 79  Resp: 16  Temp: 98.7 F (37.1 C)  SpO2: 98%     Constitutional: Alert  but chronically ill appearing Eyes: Conjunctivae are normal.  Head: Atraumatic. Nose: No congestion/rhinnorhea. Mouth/Throat: Mucous membranes are moist.   Neck: Painless ROM.  Cardiovascular:   Good peripheral circulation. Respiratory: Normal respiratory effort.  No retractions.  Gastrointestinal: Soft and nontender.  Musculoskeletal:  no deformity Neurologic:  MAE spontaneously. No gross focal neurologic deficits are appreciated.  Skin:  Skin is warm, dry and intact. No rash noted. Psychiatric: Mood and affect are normal. Speech and behavior are normal.    ED Results / Procedures / Treatments   Labs (all labs ordered are listed, but only abnormal results are  displayed) Labs Reviewed  COMPREHENSIVE METABOLIC PANEL - Abnormal; Notable for the following components:      Result Value   CO2 21 (*)    Glucose, Bld 143 (*)    BUN 64 (*)    Creatinine, Ser 2.07 (*)    Calcium 8.6 (*)    Albumin 3.0 (*)    GFR, Estimated 34 (*)    All other components within normal limits  CBC WITH DIFFERENTIAL/PLATELET - Abnormal; Notable for the following components:   RBC 2.82 (*)    Hemoglobin 8.3 (*)    HCT 27.3 (*)    Neutro Abs 9.1 (*)    Lymphs Abs 0.5 (*)    All other components within normal limits  PROTIME-INR - Abnormal; Notable for the following components:   Prothrombin Time 16.0 (*)    INR 1.3 (*)    All other components within normal limits  APTT - Abnormal; Notable for the following components:   aPTT 41 (*)    All other components within normal limits  URINALYSIS, COMPLETE (UACMP) WITH MICROSCOPIC - Abnormal; Notable for the following components:   Color, Urine YELLOW (*)  APPearance CLEAR (*)    Hgb urine dipstick MODERATE (*)    All other components within normal limits  BLOOD GAS, VENOUS - Abnormal; Notable for the following components:   pCO2, Ven 35 (*)    Acid-base deficit 3.1 (*)    All other components within normal limits  RESP PANEL BY RT-PCR (FLU A&B, COVID) ARPGX2  CULTURE, BLOOD (ROUTINE X 2)  CULTURE, BLOOD (ROUTINE X 2)  LACTIC ACID, PLASMA  LACTIC ACID, PLASMA  CK     EKG  ED ECG REPORT I, Merlyn Lot, the attending physician, personally viewed and interpreted this ECG.   Date: 08/18/2022  EKG Time: 12:31  Rate: 75  Rhythm: sinus  Axis: normal  Intervals: normal  ST&T Change: no stemi, no depressions    RADIOLOGY Please see ED Course for my review and interpretation.  I personally reviewed all radiographic images ordered to evaluate for the above acute complaints and reviewed radiology reports and findings.  These findings were personally discussed with the patient.  Please see medical record  for radiology report.    PROCEDURES:  Critical Care performed: Yes, see critical care procedure note(s)  .Critical Care  Performed by: Merlyn Lot, MD Authorized by: Merlyn Lot, MD   Critical care provider statement:    Critical care time (minutes):  35   Critical care was necessary to treat or prevent imminent or life-threatening deterioration of the following conditions:  Respiratory failure   Critical care was time spent personally by me on the following activities:  Ordering and performing treatments and interventions, ordering and review of laboratory studies, ordering and review of radiographic studies, pulse oximetry, re-evaluation of patient's condition, review of old charts, obtaining history from patient or surrogate, examination of patient, evaluation of patient's response to treatment, discussions with primary provider, discussions with consultants and development of treatment plan with patient or surrogate    MEDICATIONS ORDERED IN ED: Medications  cefTRIAXone (ROCEPHIN) 2 g in sodium chloride 0.9 % 100 mL IVPB (0 g Intravenous Stopped 08/18/22 1501)  azithromycin (ZITHROMAX) 500 mg in sodium chloride 0.9 % 250 mL IVPB (500 mg Intravenous New Bag/Given 08/18/22 1516)  aspirin EC tablet 81 mg (81 mg Oral Given 08/18/22 1517)  traMADol (ULTRAM) tablet 50 mg (has no administration in time range)  gemfibrozil (LOPID) tablet 600 mg (has no administration in time range)  simvastatin (ZOCOR) tablet 40 mg (has no administration in time range)  Interferon Beta-1b (BETASERON/EXTAVIA) injection 0.25 mg (has no administration in time range)  sertraline (ZOLOFT) tablet 25 mg (has no administration in time range)  insulin glargine-yfgn (SEMGLEE) injection 15 Units (has no administration in time range)  pantoprazole (PROTONIX) EC tablet 40 mg (40 mg Oral Given 08/18/22 1517)  sodium bicarbonate tablet 650 mg (650 mg Oral Given 08/18/22 1517)  ferrous sulfate tablet 325 mg  (has no administration in time range)  pregabalin (LYRICA) capsule 200 mg (has no administration in time range)  loratadine (CLARITIN) tablet 10 mg (10 mg Oral Given 08/18/22 1518)  ipratropium-albuterol (DUONEB) 0.5-2.5 (3) MG/3ML nebulizer solution 3 mL (has no administration in time range)  enoxaparin (LOVENOX) injection 40 mg (has no administration in time range)  insulin aspart (novoLOG) injection 0-15 Units (has no administration in time range)  acetaminophen (TYLENOL) tablet 650 mg (has no administration in time range)    Or  acetaminophen (TYLENOL) suppository 650 mg (has no administration in time range)  ondansetron (ZOFRAN) tablet 4 mg (has no administration in time range)  Or  ondansetron [facility]) injection 4 mg (has no administration in time range)  0.9 %  sodium chloride infusion ( Intravenous New Bag/Given 08/18/22 1513)  sodium chloride 0.9 % bolus 500 mL (500 mLs Intravenous New Bag/Given 08/18/22 1241)  ipratropium-albuterol (DUONEB) 0.5-2.5 (3) MG/3ML nebulizer solution 3 mL (3 mLs Nebulization Given 08/18/22 1354)     IMPRESSION / MDM / ASSESSMENT AND PLAN / ED COURSE  I reviewed the triage vital signs and the nursing notes.                              Differential diagnosis includes, but is not limited to, Dehydration, sepsis, pna, uti, hypoglycemia, cva, drug effect, withdrawal, encephalitis  Patient presenting to the ER for evaluation of symptoms as described above.  Based on symptoms, risk factors and considered above differential, this presenting complaint could reflect a potentially life-threatening illness therefore the patient will be placed on continuous pulse oximetry and telemetry for monitoring.  Laboratory evaluation will be sent to evaluate for the above complaints.      Clinical Course as of 08/18/22 1519  Wed Aug 18, 2022  1236 Chest x-ray on my review and interpretation does not show any evidence of infiltrate or effusion.  Does have mild AKI.  [PR]  1236   Lactate is normal no leukocytosis.  Stable anemia. [PR]  9169 Chest x-ray per radiology report concerning for developing infiltrate.  Per family has had similar presentation related to pneumonia in the past.  VBG without any evidence of hypercapnic respiratory failure but does have a increased O2 requirement.  Will order antibiotics will consult hospitalist for admission. [PR]    Clinical Course User Index [PR] Merlyn Lot, MD   FINAL CLINICAL IMPRESSION(S) / ED DIAGNOSES   Final diagnoses:  Weakness  Acute on chronic respiratory failure with hypoxia ([place])     Rx / DC Orders   ED Discharge Orders     None        Note:  This document was prepared using Dragon voice recognition software and may include unintentional dictation errors.    Merlyn Lot, MD 08/18/22 [phone]

## 2022-08-19 DIAGNOSIS — R531 Weakness: Secondary | ICD-10-CM | POA: Diagnosis not present

## 2022-08-19 LAB — GLUCOSE, CAPILLARY
Glucose-Capillary: 74 mg/dL (ref 70–99)
Glucose-Capillary: 144 mg/dL — ABNORMAL HIGH (ref 70–99)

## 2022-08-19 MED ORDER — INSULIN GLARGINE 100 UNIT/ML SOLOSTAR PEN: 15.0000 [IU] | PEN_INJECTOR | Freq: Every day | SUBCUTANEOUS | 11 refills | Status: DC

## 2022-08-19 MED ORDER — LOSARTAN POTASSIUM 25 MG PO TABS: ORAL_TABLET | ORAL | 1 refills | Status: DC

## 2022-08-19 MED ORDER — FUROSEMIDE 40 MG PO TABS: ORAL_TABLET | ORAL | Status: DC

## 2022-08-19 MED ORDER — INSULIN GLARGINE 100 UNIT/ML SOLOSTAR PEN: 20.0000 [IU] | PEN_INJECTOR | Freq: Every day | SUBCUTANEOUS | 11 refills | Status: DC

## 2022-08-19 NOTE — Progress Notes (Signed)
Physical Therapy Treatment Patient Details Name: Jeremy [name] MRN: [id number] DOB: 03-15-1951 Today's Date: 08/19/2022   History of Present Illness 71 y.o. male with medical history significant for diabetes mellitus with complications of stage IIIa chronic kidney disease, hypertension, history of multiple sclerosis, COPD on 2L O2 PRN, in recent weeks he has been increasing weak and has needed O2 24/7.    PT Comments    Pt is clearly weak and per reports of mobility/activity a few weeks ago he is clearly not at his baseline.  Pt on 2L O2 on arrival, sats in the mid 80s at rest, did stay in the 87-92% range on 3L during bed mobility and ambulation.  Pt had significant b/l knee buckling/inability to attain TKE that progressively got worse with increased ambulation distance.  Despite great effort (and doing more then he expected himself to do), PT encouraged sitting at ~20 ft due to safety/fall concerns.  Discussed with pt and wife about using FWW (not 4WW) when he initially gets home, possibly keeping their transport chair close when initially trying to get around in the home but they were both in agreement that she will be able to make appropriate adjustments to allow for safe management in the home, will recommend HHPT to facilitate return to baseline level of function.     Recommendations for follow up therapy are one component of a multi-disciplinary discharge planning process, led by the attending physician.  Recommendations may be updated based on patient status, additional functional criteria and insurance authorization.  Follow Up Recommendations  Home health PT     Assistance Recommended at Discharge Frequent or constant Supervision/Assistance  Patient can return home with the following A little help with bathing/dressing/bathroom;A lot of help with walking and/or transfers;Assistance with cooking/housework;Help with stairs or ramp for entrance;Assist for transportation   Equipment  Recommendations   (encouraged him to use walker, not rollator when he initially gets home)    Recommendations for Other Services       Precautions / Restrictions Precautions Precautions: Fall Restrictions Weight Bearing Restrictions: No     Mobility  Bed Mobility Overal bed mobility: Needs Assistance Bed Mobility: Supine to Sit     Supine to sit: Min assist     General bed mobility comments: Pt showed great effort in getting to sitting, ultimately despite needing a lot of extra time and use of rails he needed surprisingly little assist given he sleeps in recliner at home and never does bed mobility    Transfers Overall transfer level: Needs assistance Equipment used: Rolling walker (2 wheels) Transfers: Sit to/from Stand Sit to Stand: Mod assist           General transfer comment: Pt unable to rise (or even partially lift hips) without assist from standard height bed, slight elevation and mod assist needed to attain standing EOB with some initial posterior lean    Ambulation/Gait Ambulation/Gait assistance: Min assist, Mod assist Gait Distance (Feet): 20 Feet Assistive device: Rolling walker (2 wheels)         General Gait Details: Pt with effortful, walker reliant effort.  He did better than he had expected, but clearly had increasing fatigue with increased distance with inability to attain TKE in L then both knees and having increasing reliance on the walker just to stay upright.  Ultimately encouraged to sit in trailing recliner.  SpO2 (on 3L) t/o the effort did appear to stay in the high 80s/low 90s with the effort, subjectively very fatigued.  Stairs             Wheelchair Mobility    Modified Rankin (Stroke Patients Only)       Balance Overall balance assessment: Needs assistance Sitting-balance support: No upper extremity supported Sitting balance-Leahy Scale: Good     Standing balance support: Bilateral upper extremity supported Standing  balance-Leahy Scale: Poor Standing balance comment: knees buckling, highly UE reliant                            Cognition Arousal/Alertness: Awake/alert Behavior During Therapy: WFL for tasks assessed/performed Overall Cognitive Status: Within Functional Limits for tasks assessed                                          Exercises      General Comments        Pertinent Vitals/Pain Pain Assessment Pain Assessment: No/denies pain    Home Living Family/patient expects to be discharged to:: Private residence Living Arrangements: Spouse/significant other Available Help at Discharge: Family;Available 24 hours/day Type of Home: House Home Access:  (1 step at back enterance)       Home Layout: One level Home Equipment: Rollator (4 wheels);Cane - quad;Cane - single point;Shower seat;Grab bars - tub/shower;Wheelchair - manual;Hand held Engineering geologist (2 wheels) (Conservation officer, nature)      Prior Function            PT Goals (current goals can now be found in the care plan section) Acute Rehab PT Goals Patient Stated Goal: go home PT Goal Formulation: With patient/family Time For Goal Achievement: 09/01/22 Potential to Achieve Goals: Fair    Frequency    Min 2X/week      PT Plan      Co-evaluation              AM-PAC PT "6 Clicks" Mobility   Outcome Measure  Help needed turning from your back to your side while in a flat bed without using bedrails?: A Little Help needed moving from lying on your back to sitting on the side of a flat bed without using bedrails?: A Little Help needed moving to and from a bed to a chair (including a wheelchair)?: A Little Help needed standing up from a chair using your arms (e.g., wheelchair or bedside chair)?: A Little Help needed to walk in hospital room?: A Lot Help needed climbing 3-5 steps with a railing? : Total 6 Click Score: 15    End of Session Equipment Utilized During  Treatment: Gait belt;Oxygen Activity Tolerance: Patient limited by fatigue Patient left: with call bell/phone within reach;with chair alarm set;with family/visitor present Nurse Communication: Mobility status (O2 during activity) PT Visit Diagnosis: Muscle weakness (generalized) (M62.81);Difficulty in walking, not elsewhere classified (R26.2)     Time: 2409-7353 PT Time Calculation (min) (ACUTE ONLY): 35 min  Charges:  $Gait Training: 8-22 mins                     Kreg Shropshire, DPT 08/19/2022, 10:52 AM

## 2022-08-19 NOTE — Discharge Summary (Signed)
Physician Discharge Summary   Jeremy [name]  male DOB: [birth year]  JEH:[id number]  PCP: Juline Patch, MD  Admit date: 08/18/2022 Discharge date: 08/19/2022  Admitted From: home Disposition:  home Wife updated on the phone prior to discharge. CODE STATUS: DNR  Discharge Instructions     Diet Carb Modified   Complete by: As directed    Discharge instructions   Complete by: As directed    You have generalized weakness, which can be due to gradual deconditioning, or your blood pressure and blood sugars may have been low.    Please hold your Lasix and Losartan until followup with your outpatient doctor.  Please reduce your insulin Lantus to 20 units at bed time.   Dr. Enzo Bi [facility] Course:  For full details, please see H&P, progress notes, consult notes and ancillary notes.  Briefly,  Jeremy [name] is a 71 y.o. male with medical history significant for diabetes mellitus with complications of stage IIIa chronic kidney disease, hypertension, history of multiple sclerosis, COPD on 2 L of oxygen as needed during the day and at night, iron deficiency anemia receiving iron infusion who presented to the ER via EMS for evaluation of weakness.  According to his wife he usually ambulates with a rolling walker but over the last several days has become increasingly weak and has difficulty getting around.  * Weakness generalized Patient with a history of multiple sclerosis who presents to the ER for evaluation of generalized weakness which has worsened.  Weakness may be due to gradual decline, deconditioning, or low BP or low BG (see below) --Pt had just recently seen his outpatient neurologist about a month ago and found his MS to be stable.   --PT eval rec home with HH.  Multiple sclerosis ([place]) Continue home Betaseron --f/u with his outpatient neurologist Dr. Krista Blue   COPD (chronic obstructive pulmonary disease) ([place]) Chronic respiratory failure on 2 L O2  PRN --stable.  No increased dyspnea or hypoxia.   AKI (acute kidney injury) ([place]) At baseline patient has a serum creatinine of 1.4 and on admission it is 2.07.  BUN also elevated to 64, likely due to dehydration. --received IVF. --Hold losartan and Furosemide pending outpatient f/u.  Anemia in stage 3a chronic kidney disease (HCC) Hx of iron def Chronic and stable Continue IV iron as an outpatient   Type 2 diabetes mellitus with diabetic polyneuropathy, with long-term current use of insulin (HCC) --recent A1c only 4.8, and pt noted to be snacking often at night.  Pt was taking Lantus 30u nightly PTA, which is likely too much.  Home Lantus reduced to 20u nightly. --home metformin resumed after discharge.  HTN --hold home Lasix and losartan pending outpatient f/u since BP was low normal without them.  Gastroesophageal reflux disease without esophagitis Continue PPI   Depression Continue sertraline    Discharge Diagnoses:  Principal Problem:   Weakness Active Problems:   COPD (chronic obstructive pulmonary disease) (HCC)   AKI (acute kidney injury) ([place])   Multiple sclerosis (HCC)   Depression   Gastroesophageal reflux disease without esophagitis   Type 2 diabetes mellitus with diabetic polyneuropathy, with long-term current use of insulin (HCC)   Anemia in stage 3a chronic kidney disease ([place])   30 Day Unplanned Readmission Risk Score    Flowsheet Row ED to Hosp-Admission (Current) from 08/18/2022 in [facility]  30 Day Unplanned Readmission Risk Score (%) 23.46 Filed at  08/19/2022 0801       This score is the patient's risk of an unplanned readmission within 30 days of being discharged (0 -100%). The score is based on dignosis, age, lab data, medications, orders, and past utilization.   Low:  0-14.9   Medium: 15-21.9   High: 22-29.9   Extreme: 30 and above         Discharge Instructions:  Allergies as of 08/19/2022        Reactions   Betadine [povidone Iodine]         Medication List     TAKE these medications    albuterol 108 (90 Base) MCG/ACT inhaler Commonly known as: VENTOLIN HFA Inhale 2 puffs into the lungs in the morning, at noon, in the evening, and at bedtime.   aspirin 81 MG tablet Take 81 mg by mouth daily.   B-D INSULIN SYRINGE 1CC/25GX1" 25G X 1" 1 ML Misc Generic drug: Insulin Syringe-Needle U-100 USE AS DIRECTED   Betaseron 0.3 MG Kit injection Generic drug: Interferon Beta-1b Inject subcutaneously 1 syringe every other day.   ferrous sulfate 325 (65 FE) MG tablet Take 325 mg by mouth daily with breakfast.   furosemide 40 MG tablet Commonly known as: LASIX Hold until followup with outpatient provider due to dehydration and low normal blood pressure. What changed:  how much to take how to take this when to take this additional instructions   gemfibrozil 600 MG tablet Commonly known as: LOPID TAKE 1 TABLET BY MOUTH TWICE  DAILY BEFORE MEALS   insulin glargine 100 UNIT/ML Solostar Pen Commonly known as: LANTUS Inject 20 Units into the skin at bedtime. What changed: how much to take   loratadine 10 MG tablet Commonly known as: CLARITIN Take 1 tablet (10 mg total) by mouth daily.   losartan 25 MG tablet Commonly known as: COZAAR Hold until followup with outpatient provider due to dehydration and low normal blood pressure. What changed:  how much to take how to take this when to take this additional instructions   metFORMIN 500 MG tablet Commonly known as: GLUCOPHAGE Take 500 mg by mouth 2 (two) times daily.   mupirocin ointment 2 % Commonly known as: BACTROBAN Apply 1 Application topically 2 (two) times daily. Apply to effected area bid   omeprazole 40 MG capsule Commonly known as: PRILOSEC TAKE 1 CAPSULE BY MOUTH DAILY   pregabalin 200 MG capsule Commonly known as: LYRICA Take 1 capsule (200 mg total) by mouth 2 (two) times daily.    sertraline 50 MG tablet Commonly known as: ZOLOFT Take 1/2 tablet daily   simvastatin 40 MG tablet Commonly known as: ZOCOR Take 1 tablet (40 mg total) by mouth daily.   sodium bicarbonate 650 MG tablet Take 650 mg by mouth 2 (two) times daily.   traMADol 50 MG tablet Commonly known as: ULTRAM TAKE 1 TABLET(50 MG) BY MOUTH EVERY 6 HOURS AS NEEDED         Follow-up Information     Juline Patch, MD Follow up in 1 week(s).   Specialty: Family Medicine Contact information: [address] [place] [address] [phone]                 Allergies  Allergen Reactions   Betadine [Povidone Iodine]      The results of significant diagnostics from this hospitalization (including imaging, microbiology, ancillary and laboratory) are listed below for reference.   Consultations:   Procedures/Studies: CT CHEST WO CONTRAST  Result  Date: 08/18/2022 CLINICAL DATA:  Pneumonia, complication suspected. EXAM: CT CHEST WITHOUT CONTRAST TECHNIQUE: Multidetector CT imaging of the chest was performed following the standard protocol without IV contrast. RADIATION DOSE REDUCTION: This exam was performed according to the departmental dose-optimization program which includes automated exposure control, adjustment of the mA and/or kV according to patient size and/or use of iterative reconstruction technique. COMPARISON:  Chest radiography same day. Previous chest CT 02/03/2022. PET scan 11/30/2021. CT chest 10/07/2021. FINDINGS: Cardiovascular: Heart size is normal. Coronary artery calcification and aortic atherosclerotic calcification are present. Mediastinum/Nodes: No mediastinal or hilar mass or lymphadenopathy. Lungs/Pleura: 12 mm well-circumscribed nodule in the anterior left upper lobe is unchanged since December of last year and did not show increased metabolism at PET scan. No further follow-up was or is recommended. Elsewhere, mild pleural and parenchymal scarring is  noted at both lung apices. There has been resolution of the previously seen left lower lobe pneumonia, with mild scarring in the region. No evidence of active infiltrate. No pleural effusion. Upper Abdomen: Negative other than early findings of cirrhosis of the liver. Musculoskeletal: Negative IMPRESSION: 1. Resolution of the previously seen left lower lobe pneumonia with mild scarring in the region. No evidence of active infiltrate. 2. 12 mm well-circumscribed nodule in the anterior left upper lobe, unchanged since December of last year. This did not show hypermetabolism at PET scan. No follow-up was or is recommended. 3. Coronary artery calcification and aortic atherosclerotic calcification. 4. Early findings of cirrhosis of the liver. Aortic Atherosclerosis (ICD10-I70.0). Electronically Signed   By: Nelson Chimes M.D.   On: 08/18/2022 14:40   CT HEAD WO CONTRAST (5MM)  Result Date: 08/18/2022 CLINICAL DATA:  Dizziness EXAM: CT HEAD WITHOUT CONTRAST TECHNIQUE: Contiguous axial images were obtained from the base of the skull through the vertex without intravenous contrast. RADIATION DOSE REDUCTION: This exam was performed according to the departmental dose-optimization program which includes automated exposure control, adjustment of the mA and/or kV according to patient size and/or use of iterative reconstruction technique. COMPARISON:  08/27/2013 FINDINGS: Brain: No evidence of acute infarction, hemorrhage, extra-axial collection, ventriculomegaly, or mass effect. Generalized cerebral atrophy. Periventricular white matter low attenuation likely secondary to microangiopathy. Vascular: Cerebrovascular atherosclerotic calcifications are noted. Skull: Negative for fracture or focal lesion. Sinuses/Orbits: Visualized portions of the orbits are unremarkable. Visualized portions of the paranasal sinuses are unremarkable. Visualized portions of the mastoid air cells are unremarkable. Other: None. IMPRESSION: 1. No  acute intracranial findings. 2. Chronic small vessel ischemic changes. Electronically Signed   By: Kathreen Devoid M.D.   On: 08/18/2022 13:06   DG Chest Port 1 View  Result Date: 08/18/2022 CLINICAL DATA:  Increased weakness and increased oxygen requirements with concern for sepsis EXAM: PORTABLE CHEST 1 VIEW COMPARISON:  Chest radiograph dated 02/15/2022, CTA chest dated 02/03/2022 FINDINGS: Slightly decreased lung volumes with bronchovascular crowding. Increased prominence of interstitial opacities. Similar right upper lobe nodule. No pleural effusion or pneumothorax. Similar cardiomediastinal silhouette. The visualized skeletal structures are unremarkable. IMPRESSION: 1. Increased prominence of interstitial opacities, which may represent pulmonary edema or atypical infection. 2. Similar right upper lobe nodule. Electronically Signed   By: Darrin Nipper M.D.   On: 08/18/2022 12:21      Labs: BNP (last 3 results) No results for input(s): "BNP" in the last 8760 hours. Basic Metabolic Panel: Recent Labs  Lab 08/18/22 1145  NA 137  K 5.1  CL 109  CO2 21*  GLUCOSE 143*  BUN 64*  CREATININE 2.07*  CALCIUM 8.6*   Liver Function Tests: Recent Labs  Lab 08/18/22 1145  AST 20  ALT 10  ALKPHOS 61  BILITOT 0.5  PROT 7.1  ALBUMIN 3.0*   No results for input(s): "LIPASE", "AMYLASE" in the last 168 hours. Recent Labs  Lab 08/18/22 1936  AMMONIA 47*   CBC: Recent Labs  Lab 08/18/22 1145  WBC 10.5  NEUTROABS 9.1*  HGB 8.3*  HCT 27.3*  MCV 96.8  PLT 195   Cardiac Enzymes: Recent Labs  Lab 08/18/22 1145  CKTOTAL 87   BNP: Invalid input(s): "POCBNP" CBG: Recent Labs  Lab 08/18/22 1743 08/18/22 2108 08/19/22 0728 08/19/22 1142  GLUCAP 133* 105* 74 144*   D-Dimer No results for input(s): "DDIMER" in the last 72 hours. Hgb A1c No results for input(s): "HGBA1C" in the last 72 hours. Lipid Profile No results for input(s): "CHOL", "HDL", "LDLCALC", "TRIG", "CHOLHDL",  "LDLDIRECT" in the last 72 hours. Thyroid function studies No results for input(s): "TSH", "T4TOTAL", "T3FREE", "THYROIDAB" in the last 72 hours.  Invalid input(s): "FREET3" Anemia work up No results for input(s): "VITAMINB12", "FOLATE", "FERRITIN", "TIBC", "IRON", "RETICCTPCT" in the last 72 hours. Urinalysis    Component Value Date/Time   COLORURINE YELLOW (A) 08/18/2022 1237   APPEARANCEUR CLEAR (A) 08/18/2022 1237   APPEARANCEUR Hazy 08/27/2013 1157   LABSPEC 1.017 08/18/2022 1237   LABSPEC 1.013 08/27/2013 1157   PHURINE 5.0 08/18/2022 1237   GLUCOSEU NEGATIVE 08/18/2022 1237   GLUCOSEU Negative 08/27/2013 1157   HGBUR MODERATE (A) 08/18/2022 1237   BILIRUBINUR NEGATIVE 08/18/2022 1237   BILIRUBINUR Negative 08/27/2013 1157   KETONESUR NEGATIVE 08/18/2022 1237   PROTEINUR NEGATIVE 08/18/2022 1237   NITRITE NEGATIVE 08/18/2022 [place] 08/18/2022 1237   LEUKOCYTESUR Negative 08/27/2013 1157   Sepsis Labs Recent Labs  Lab 08/18/22 1145  WBC 10.5   Microbiology Recent Results (from the past 240 hour(s))  Resp Panel by RT-PCR (Flu A&B, Covid) Anterior Nasal Swab     Status: None   Collection Time: 08/18/22 11:45 AM   Specimen: Anterior Nasal Swab  Result Value Ref Range Status   SARS Coronavirus 2 by RT PCR NEGATIVE NEGATIVE Final    Comment: (NOTE) SARS-CoV-2 target nucleic acids are NOT DETECTED.  The SARS-CoV-2 RNA is generally detectable in upper respiratory specimens during the acute phase of infection. The lowest concentration of SARS-CoV-2 viral copies this assay can detect is 138 copies/mL. A negative result does not preclude SARS-Cov-2 infection and should not be used as the sole basis for treatment or other patient management decisions. A negative result may occur with  improper specimen collection/handling, submission of specimen other than nasopharyngeal swab, presence of viral mutation(s) within the areas targeted by this assay, and  inadequate number of viral copies(<138 copies/mL). A negative result must be combined with clinical observations, patient history, and epidemiological information. The expected result is Negative.  Fact Sheet for Patients:  EntrepreneurPulse.com.au  Fact Sheet for Healthcare Providers:  IncredibleEmployment.be  This test is no t yet approved or cleared by the [place] FDA and  has been authorized for detection and/or diagnosis of SARS-CoV-2 by FDA under an Emergency Use Authorization (EUA). This EUA will remain  in effect (meaning this test can be used) for the duration of the COVID-19 declaration under Section 564(b)(1) of the Act, 21 U.S.C.section 360bbb-3(b)(1), unless the authorization is terminated  or revoked sooner.       Influenza A by PCR NEGATIVE NEGATIVE Final   Influenza  B by PCR NEGATIVE NEGATIVE Final    Comment: (NOTE) The Xpert Xpress SARS-CoV-2/FLU/RSV plus assay is intended as an aid in the diagnosis of influenza from Nasopharyngeal swab specimens and should not be used as a sole basis for treatment. Nasal washings and aspirates are unacceptable for Xpert Xpress SARS-CoV-2/FLU/RSV testing.  Fact Sheet for Patients: EntrepreneurPulse.com.au  Fact Sheet for Healthcare Providers: IncredibleEmployment.be  This test is not yet approved or cleared by the [place] FDA and has been authorized for detection and/or diagnosis of SARS-CoV-2 by FDA under an Emergency Use Authorization (EUA). This EUA will remain in effect (meaning this test can be used) for the duration of the COVID-19 declaration under Section 564(b)(1) of the Act, 21 U.S.C. section 360bbb-3(b)(1), unless the authorization is terminated or revoked.  Performed at [facility], [address] [place], [place] [address]   Blood culture (routine x 2)     Status: None (Preliminary result)   Collection Time:  08/18/22  1:51 PM   Specimen: Right Antecubital; Blood  Result Value Ref Range Status   Specimen Description RIGHT ANTECUBITAL  Final   Special Requests   Final    BOTTLES DRAWN AEROBIC AND ANAEROBIC Blood Culture adequate volume   Culture   Final    NO GROWTH < 24 HOURS Performed at [facility], [address] [place], [place] [address]    Report Status PENDING  Incomplete  Blood culture (routine x 2)     Status: None (Preliminary result)   Collection Time: 08/18/22  1:51 PM   Specimen: BLOOD LEFT FOREARM  Result Value Ref Range Status   Specimen Description BLOOD LEFT FOREARM  Final   Special Requests   Final    BOTTLES DRAWN AEROBIC AND ANAEROBIC Blood Culture results may not be optimal due to an excessive volume of blood received in culture bottles   Culture   Final    NO GROWTH < 24 HOURS Performed at [facility], [address], [place], [place] [address]    Report Status PENDING  Incomplete     Total time spend on discharging this patient, including the last patient exam, discussing the hospital stay, instructions for ongoing care as it relates to all pertinent caregivers, as well as preparing the medical discharge records, prescriptions, and/or referrals as applicable, is 45 minutes.    Enzo Bi, MD  [facility] 08/19/2022, 12:55 PM

## 2022-08-19 NOTE — Care Management CC44 (Signed)
Condition Code 44 Documentation Completed  Patient Details  Name: Jeremy [name] MRN: [id number] Date of Birth: Apr 05, 1951   Condition Code 44 given:  Yes Patient signature on Condition Code 44 notice:  Yes Documentation of 2 MD's agreement:    Code 44 added to claim:  Yes    Colen Darling, [place] 08/19/2022, 12:53 PM

## 2022-08-19 NOTE — Care Management CC44 (Signed)
Condition Code 44 Documentation Completed  Patient Details  Name: Jeremy [name] MRN: [id number] Date of Birth: 06/16/1951   Condition Code 44 given:  Yes  Patient signature on Condition Code 44 notice:   Caesar Bookman Documentation of 2 MD's agreement:    Code 44 added to claim:       Colen Darling, [place] 08/19/2022, 12:52 PM

## 2022-08-19 NOTE — TOC Transition Note (Signed)
Transition of Care [facility]) - CM/SW Discharge Note   Patient Details  Name: Trei Schoch MRN: [id number] Date of Birth: 07/18/1951  Transition of [facility]) CM/SW Contact:  Colen Darling, [place] Phone Number: 08/19/2022, 3:14 PM   Clinical Narrative:     [place]  Final next level of care: [place]     Patient Goals and CMS Choice   CMS Medicare.gov Compare Post Acute Care list provided to:: Patient Represenative (must comment) Choice offered to / list presented to : Spouse  Discharge Placement                  Name of family member notified: Caesar Bookman Patient and family notified of of transfer: 08/19/22  Discharge Plan and Services                   [place]           Date [place]: 08/19/22   Representative spoke with at [place]: [place] ([place]) Interventions     Readmission Risk Interventions    02/04/2022    9:47 AM  Readmission Risk Prevention Plan  Transportation Screening Complete  PCP or Specialist Appt within 3-5 Days Complete  Social Work Consult for [place] Planning/Counseling Complete  Palliative Care Screening Not Applicable  Medication Review Press photographer) Complete

## 2022-08-19 NOTE — Plan of Care (Signed)

## 2022-08-19 NOTE — Progress Notes (Signed)
Reviewed AVS with patient and wife, Verbalized understanding of all instructions including medications to hold until seen by PCP. All belongings returned to patient in room,

## 2022-08-19 NOTE — TOC Transition Note (Signed)
Transition of Care [facility]) - CM/SW Discharge Note   Patient Details  Name: Jeremy [name] MRN: [id number] Date of Birth: December 12, 1950  Transition of Care [facility]) CM/SW Contact:  Colen Darling, [place] Phone Number: 08/19/2022, 1:21 PM   Clinical Narrative:     SW spoke to the patient's family and they are interested in [place]. SW reached out to [facility]  Final next level of care: [place]     Patient Goals and CMS Choice   CMS Medicare.gov Compare Post Acute Care list provided to:: Patient Represenative (must comment) Choice offered to / list presented to : Spouse  Discharge Placement                  Name of family member notified: Caesar Bookman Patient and family notified of of transfer: 08/19/22  Discharge Plan and Services                 HHPT             Date [place]: 08/19/22   Representative spoke with at [place]: [place] ([place]) Interventions     Readmission Risk Interventions    02/04/2022    9:47 AM  Readmission Risk Prevention Plan  Transportation Screening Complete  PCP or Specialist Appt within 3-5 Days Complete  Social Work Consult for [place] Planning/Counseling Complete  Palliative Care Screening Not Applicable  Medication Review Press photographer) Complete

## 2022-08-20 DIAGNOSIS — G35 Multiple sclerosis: Secondary | ICD-10-CM | POA: Diagnosis not present

## 2022-08-20 DIAGNOSIS — Z87891 Personal history of nicotine dependence: Secondary | ICD-10-CM | POA: Diagnosis not present

## 2022-08-20 DIAGNOSIS — I129 Hypertensive chronic kidney disease with stage 1 through stage 4 chronic kidney disease, or unspecified chronic kidney disease: Secondary | ICD-10-CM | POA: Diagnosis not present

## 2022-08-20 DIAGNOSIS — Z79891 Long term (current) use of opiate analgesic: Secondary | ICD-10-CM | POA: Diagnosis not present

## 2022-08-20 DIAGNOSIS — Z9181 History of falling: Secondary | ICD-10-CM | POA: Diagnosis not present

## 2022-08-20 DIAGNOSIS — N1831 Chronic kidney disease, stage 3a: Secondary | ICD-10-CM | POA: Diagnosis not present

## 2022-08-20 DIAGNOSIS — E785 Hyperlipidemia, unspecified: Secondary | ICD-10-CM | POA: Diagnosis not present

## 2022-08-20 DIAGNOSIS — D631 Anemia in chronic kidney disease: Secondary | ICD-10-CM | POA: Diagnosis not present

## 2022-08-20 DIAGNOSIS — F32A Depression, unspecified: Secondary | ICD-10-CM | POA: Diagnosis not present

## 2022-08-20 DIAGNOSIS — E1122 Type 2 diabetes mellitus with diabetic chronic kidney disease: Secondary | ICD-10-CM | POA: Diagnosis not present

## 2022-08-20 DIAGNOSIS — J9611 Chronic respiratory failure with hypoxia: Secondary | ICD-10-CM | POA: Diagnosis not present

## 2022-08-20 DIAGNOSIS — Z7982 Long term (current) use of aspirin: Secondary | ICD-10-CM | POA: Diagnosis not present

## 2022-08-20 DIAGNOSIS — K219 Gastro-esophageal reflux disease without esophagitis: Secondary | ICD-10-CM | POA: Diagnosis not present

## 2022-08-20 DIAGNOSIS — E1142 Type 2 diabetes mellitus with diabetic polyneuropathy: Secondary | ICD-10-CM | POA: Diagnosis not present

## 2022-08-20 DIAGNOSIS — Z9981 Dependence on supplemental oxygen: Secondary | ICD-10-CM | POA: Diagnosis not present

## 2022-08-20 DIAGNOSIS — Z7984 Long term (current) use of oral hypoglycemic drugs: Secondary | ICD-10-CM | POA: Diagnosis not present

## 2022-08-20 DIAGNOSIS — G8929 Other chronic pain: Secondary | ICD-10-CM | POA: Diagnosis not present

## 2022-08-20 DIAGNOSIS — Z794 Long term (current) use of insulin: Secondary | ICD-10-CM | POA: Diagnosis not present

## 2022-08-20 DIAGNOSIS — J449 Chronic obstructive pulmonary disease, unspecified: Secondary | ICD-10-CM | POA: Diagnosis not present

## 2022-08-23 ENCOUNTER — Telehealth: Payer: Self-pay

## 2022-08-23 ENCOUNTER — Ambulatory Visit (INDEPENDENT_AMBULATORY_CARE_PROVIDER_SITE_OTHER): Payer: Medicare Other | Admitting: Family Medicine

## 2022-08-23 ENCOUNTER — Other Ambulatory Visit
Admission: RE | Admit: 2022-08-23 | Discharge: 2022-08-23 | Disposition: A | Discharge status: Home / Self Care | Payer: Medicare Other | Source: Home / Self Care | Attending: *Deleted | Admitting: *Deleted

## 2022-08-23 ENCOUNTER — Ambulatory Visit
Admission: RE | Admit: 2022-08-23 | Discharge: 2022-08-23 | Disposition: A | Discharge status: Home / Self Care | Payer: Medicare Other | Attending: Family Medicine | Admitting: Family Medicine

## 2022-08-23 ENCOUNTER — Telehealth: Payer: Self-pay | Admitting: Family Medicine

## 2022-08-23 ENCOUNTER — Encounter: Payer: Self-pay | Admitting: Family Medicine

## 2022-08-23 ENCOUNTER — Ambulatory Visit
Admission: RE | Admit: 2022-08-23 | Discharge: 2022-08-23 | Disposition: A | Discharge status: Home / Self Care | Payer: Medicare Other | Source: Ambulatory Visit | Attending: Family Medicine | Admitting: Family Medicine

## 2022-08-23 VITALS — BP 120/62 | HR 64 | Ht 60.0 in | Wt 213.0 lb

## 2022-08-23 DIAGNOSIS — E1142 Type 2 diabetes mellitus with diabetic polyneuropathy: Secondary | ICD-10-CM

## 2022-08-23 DIAGNOSIS — R531 Weakness: Secondary | ICD-10-CM

## 2022-08-23 DIAGNOSIS — R051 Acute cough: Secondary | ICD-10-CM | POA: Diagnosis not present

## 2022-08-23 DIAGNOSIS — N1831 Chronic kidney disease, stage 3a: Secondary | ICD-10-CM | POA: Diagnosis not present

## 2022-08-23 DIAGNOSIS — R059 Cough, unspecified: Secondary | ICD-10-CM | POA: Diagnosis not present

## 2022-08-23 DIAGNOSIS — N179 Acute kidney failure, unspecified: Secondary | ICD-10-CM | POA: Diagnosis not present

## 2022-08-23 DIAGNOSIS — J449 Chronic obstructive pulmonary disease, unspecified: Secondary | ICD-10-CM | POA: Diagnosis not present

## 2022-08-23 DIAGNOSIS — I270 Primary pulmonary hypertension: Secondary | ICD-10-CM | POA: Diagnosis not present

## 2022-08-23 DIAGNOSIS — D631 Anemia in chronic kidney disease: Secondary | ICD-10-CM | POA: Diagnosis not present

## 2022-08-23 DIAGNOSIS — J3489 Other specified disorders of nose and nasal sinuses: Secondary | ICD-10-CM | POA: Diagnosis not present

## 2022-08-23 LAB — RENAL FUNCTION PANEL
Albumin: 2.7 g/dL — ABNORMAL LOW (ref 3.5–5.0)
Anion gap: 4 — ABNORMAL LOW (ref 5–15)
BUN: 33 mg/dL — ABNORMAL HIGH (ref 8–23)
CO2: 24 mmol/L (ref 22–32)
Calcium: 8.3 mg/dL — ABNORMAL LOW (ref 8.9–10.3)
Chloride: 111 mmol/L (ref 98–111)
Creatinine, Ser: 1.3 mg/dL — ABNORMAL HIGH (ref 0.61–1.24)
GFR, Estimated: 59 mL/min — ABNORMAL LOW (ref >60)
Glucose, Bld: 104 mg/dL — ABNORMAL HIGH (ref 70–99)
Phosphorus: 2.8 mg/dL (ref 2.5–4.6)
Potassium: 4.5 mmol/L (ref 3.5–5.1)
Sodium: 139 mmol/L (ref 135–145)

## 2022-08-23 LAB — CULTURE, BLOOD (ROUTINE X 2)
Culture: NO GROWTH
Culture: NO GROWTH
Special Requests: ADEQUATE

## 2022-08-23 NOTE — Telephone Encounter (Signed)
Home Health Verbal Orders - Caller/Agency: [facility]/ [place] Number: [phone]  Requesting OT/PT/Skilled Nursing/Social Work/Speech Therapy: PT  Frequency: 1w1 2w3 1w5

## 2022-08-23 NOTE — Telephone Encounter (Signed)
Transition Care Management Unsuccessful Follow-up Telephone Call  Date of discharge and from where:  08/19/2022  Attempts:  1st Attempt  Reason for unsuccessful TCM follow-up call:  Left voice message

## 2022-08-23 NOTE — Progress Notes (Signed)
Date:  08/23/2022   Name:  Jeremy [name]   DOB:  [birth year]   MRN:  [id number]   Chief Complaint: Hospitalization Follow-up (D/ from hospital on 08/19/22- having runny nose that pt attributes to oxygen use )  Follow up Hospitalization  Patient was admitted to [place] [facility] on 10/25 and discharged on 10/26. He was treated for acute on chronic resp failure. Treatment for this included nasal oxygen initiated. Telephone follow up was done on 08/23/22 He reports fair compliance with treatment. He reports this condition is improved.  ----------------------------------------------------------------------------------------- -     URI  This is a new (rhinorrhea) problem. The current episode started in the past 7 days. The problem has been gradually improving. There has been no fever. Associated symptoms include coughing, rhinorrhea and sneezing. Pertinent negatives include no abdominal pain, chest pain, ear pain or wheezing. Treatments tried: change in position to supine. The treatment provided moderate relief.  Cough This is a new problem. The current episode started in the past 7 days. The problem has been gradually improving. The cough is Productive of sputum. Associated symptoms include nasal congestion, rhinorrhea and shortness of breath. Pertinent negatives include no chest pain, chills, ear pain, fever, weight loss or wheezing. His past medical history is significant for COPD.  Shortness of Breath This is a recurrent (copd) problem. Associated symptoms include rhinorrhea. Pertinent negatives include no abdominal pain, chest pain, ear pain, fever or wheezing. Treatments tried: nasal oxygen. The treatment provided mild relief. His past medical history is significant for COPD and a heart failure.  Extremity Weakness  The pain is present in the right lower leg, left lower leg and left upper leg (weakness). This is a new problem. The current episode started 1 to 4 weeks ago. The problem  has been gradually worsening. Pertinent negatives include no fever or inability to bear weight.  Diabetes He presents for his follow-up diabetic visit. He has type 2 diabetes mellitus. His disease course has been stable. There are no hypoglycemic associated symptoms. Pertinent negatives for hypoglycemia include no confusion. Pertinent negatives for diabetes include no chest pain, no fatigue, no polydipsia, no polyphagia, no polyuria and no weight loss. There are no hypoglycemic complications. Symptoms are improving. He is following a generally healthy diet. His home blood glucose trend is increasing steadily. His breakfast blood glucose range is generally 130-140 mg/dl. (On 30 units 28m-100mg% but now on 20 units 130 to 140 mg%)  Anemia Presents for follow-up visit. There has been no abdominal pain, confusion, fever, palpitations or weight loss. Signs of blood loss that are not present include hematemesis, hematochezia and melena. Past medical history includes chronic renal disease and heart failure. Compliance problems: on iron infusions.   Hypertension This is a chronic problem. The current episode started more than 1 year ago. The problem has been gradually improving since onset. The problem is controlled. Associated symptoms include shortness of breath. Pertinent negatives include no chest pain or palpitations. Past treatments include diuretics and angiotensin blockers (cont off lasix/furosemide). The current treatment provides moderate improvement. There are no compliance problems.  Hypertensive end-organ damage includes heart failure. Identifiable causes of hypertension include chronic renal disease.    Lab Results  Component Value Date   NA 137 08/18/2022   K 5.1 08/18/2022   CO2 21 (L) 08/18/2022   GLUCOSE 143 (H) 08/18/2022   BUN 64 (H) 08/18/2022   CREATININE 2.07 (H) 08/18/2022   CALCIUM 8.6 (L) 08/18/2022   EGFR 53 (L) 02/15/2022  GFRNONAA 34 (L) 08/18/2022   Lab Results   Component Value Date   CHOL 103 07/10/2021   HDL 22 (L) 07/10/2021   LDLCALC 35 07/10/2021   TRIG 228 (H) 07/10/2021   CHOLHDL 4.7 07/10/2021   Lab Results  Component Value Date   TSH 1.90 03/30/2021   Lab Results  Component Value Date   HGBA1C 4.8 04/05/2022   Lab Results  Component Value Date   WBC 10.5 08/18/2022   HGB 8.3 (L) 08/18/2022   HCT 27.3 (L) 08/18/2022   MCV 96.8 08/18/2022   PLT 195 08/18/2022   Lab Results  Component Value Date   ALT 10 08/18/2022   AST 20 08/18/2022   ALKPHOS 61 08/18/2022   BILITOT 0.5 08/18/2022   No results found for: "25OHVITD2", "25OHVITD3", "VD25OH"   Review of Systems  Constitutional:  Negative for chills, fatigue, fever and weight loss.  HENT:  Positive for rhinorrhea and sneezing. Negative for ear pain and sinus pressure.   Respiratory:  Positive for cough and shortness of breath. Negative for chest tightness and wheezing.        No orthopnea/or PND  Cardiovascular:  Negative for chest pain and palpitations.  Gastrointestinal:  Negative for abdominal pain, blood in stool, hematemesis, hematochezia and melena.  Endocrine: Negative for polydipsia, polyphagia and polyuria.  Musculoskeletal:  Positive for extremity weakness.  Psychiatric/Behavioral:  Negative for confusion.     Patient Active Problem List   Diagnosis Date Noted   Weakness 08/18/2022   AKI (acute kidney injury) ([place]) 08/18/2022   Severe sepsis ([place]) 02/04/2022   Pneumonia 02/03/2022   Acute on chronic respiratory failure with hypoxia (HCC) 02/03/2022   Hyperkalemia 02/03/2022   COPD (chronic obstructive pulmonary disease) ([place]) 02/03/2022   SIRS (systemic inflammatory response syndrome), possible sepsis ([place]) 02/03/2022   Symptomatic anemia 09/24/2021   Gastric polyp    Gastritis without bleeding    Anemia in stage 3a chronic kidney disease ([place]) 06/19/2021   Iron deficiency anemia due to chronic blood loss 06/19/2021   Gait abnormality 05/07/2021    Stage 3a chronic kidney disease ([place]) 05/20/2020   DM type 2 with diabetic peripheral neuropathy ([place]) 05/06/2020   Idiopathic peripheral neuropathy 01/15/2020   Generalized edema 01/15/2020   Primary pulmonary hypertension ([place]) 10/28/2019   Therapeutic drug monitoring 03/23/2018   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   Ventricular ectopic beats 09/26/2017   Type 2 diabetes mellitus with diabetic polyneuropathy, with long-term current use of insulin ([place]) 08/12/2017   Hyperlipidemia due to type 2 diabetes mellitus ([place]) 08/12/2017   B12 deficiency 12/14/2016   Low back pain 03/10/2016   Lumbosacral disc disease 01/01/2016   Neuropathy associated with endocrine disorder ([place]) 11/19/2015   B12 deficiency anemia 06/03/2015   Essential hypertension 06/03/2015   Hyperlipidemia 06/03/2015   Depression 06/03/2015   Gastroesophageal reflux disease without esophagitis 06/03/2015   Edema extremities 06/03/2015   Multiple sclerosis ([place]) 07/26/2013   Abnormality of gait 07/26/2013   Morbid obesity ([place]) 07/26/2013    Allergies  Allergen Reactions   Betadine [Povidone Iodine]     Past Surgical History:  Procedure Laterality Date   COLECTOMY  06-2008   COLONOSCOPY  2015   normal   COLONOSCOPY WITH PROPOFOL N/A 09/10/2021   Procedure: COLONOSCOPY WITH PROPOFOL;  Surgeon: [name], MD;  Location[facility] ENDOSCOPY;  Service: Endoscopy;  Laterality: N/A;   ESOPHAGOGASTRODUODENOSCOPY (EGD) WITH PROPOFOL N/A 09/10/2021   Procedure: ESOPHAGOGASTRODUODENOSCOPY (EGD) WITH PROPOFOL;  Surgeon:  [name], MD;  Location: [facility] ENDOSCOPY;  Service: Endoscopy;  Laterality: N/A;   GIVENS CAPSULE STUDY N/A 09/25/2021   Procedure: GIVENS CAPSULE STUDY;  Surgeon: Jonathon Bellows, MD;  Location: [facility] ENDOSCOPY;  Service: Gastroenterology;  Laterality: N/A;    Social History   Tobacco Use   Smoking status: Former    Packs/day: 1.50    Years: 30.00    Total pack years:  45.00    Types: Cigarettes    Quit date: 01/23/2006    Years since quitting: 16.5   Smokeless tobacco: Never   Tobacco comments:    N/A  Vaping Use   Vaping Use: Never used  Substance Use Topics   Alcohol use: Not Currently    Comment: rare; maybe 2 beers a year   Drug use: No     Medication list has been reviewed and updated.  Current Meds  Medication Sig   albuterol (VENTOLIN HFA) 108 (90 Base) MCG/ACT inhaler Inhale 2 puffs into the lungs in the morning, at noon, in the evening, and at bedtime.   aspirin 81 MG tablet Take 81 mg by mouth daily.   B-D INSULIN SYRINGE 1CC/25GX1" 25G X 1" 1 ML MISC USE AS DIRECTED   ferrous sulfate 325 (65 FE) MG tablet Take 325 mg by mouth daily with breakfast.   gemfibrozil (LOPID) 600 MG tablet TAKE 1 TABLET BY MOUTH TWICE  DAILY BEFORE MEALS   insulin glargine (LANTUS) 100 UNIT/ML Solostar Pen Inject 20 Units into the skin at bedtime.   Interferon Beta-1b (BETASERON) 0.3 MG KIT injection Inject subcutaneously 1 syringe every other day.   loratadine (CLARITIN) 10 MG tablet Take 1 tablet (10 mg total) by mouth daily.   metFORMIN (GLUCOPHAGE) 500 MG tablet Take 500 mg by mouth 2 (two) times daily.   mupirocin ointment (BACTROBAN) 2 % Apply 1 Application topically 2 (two) times daily. Apply to effected area bid   omeprazole (PRILOSEC) 40 MG capsule TAKE 1 CAPSULE BY MOUTH DAILY   pregabalin (LYRICA) 200 MG capsule Take 1 capsule (200 mg total) by mouth 2 (two) times daily.   sertraline (ZOLOFT) 50 MG tablet Take 1/2 tablet daily   simvastatin (ZOCOR) 40 MG tablet Take 1 tablet (40 mg total) by mouth daily.   sodium bicarbonate 650 MG tablet Take 650 mg by mouth 2 (two) times daily.   traMADol (ULTRAM) 50 MG tablet TAKE 1 TABLET(50 MG) BY MOUTH EVERY 6 HOURS AS NEEDED       07/02/2022    1:33 PM 02/15/2022    1:36 PM 12/28/2021    1:24 PM 06/30/2021    9:24 AM  GAD 7 : Generalized Anxiety Score  Nervous, Anxious, on Edge 0 0 0 0  Control/stop  worrying 0 0 0 0  Worry too much - different things 0 0 0 0  Trouble relaxing 0 0 0 0  Restless 0 0 0 0  Easily annoyed or irritable 0 0 0 0  Afraid - awful might happen 0 0 0 0  Total GAD 7 Score 0 0 0 0  Anxiety Difficulty Not difficult at all Not difficult at all Not difficult at all        07/02/2022    1:33 PM 02/15/2022    1:35 PM 12/28/2021    1:24 PM  Depression screen PHQ 2/9  Decreased Interest 0 0 0  Down, Depressed, Hopeless 0 0 0  PHQ - 2 Score 0 0 0  Altered sleeping 0 0 0  Tired, decreased energy 0 0 0  Change in appetite 0 0 0  Feeling bad or failure about yourself  0 0 0  Trouble concentrating 0 0 0  Moving slowly or fidgety/restless 0 0 0  Suicidal thoughts 0 0 0  PHQ-9 Score 0 0 0  Difficult doing work/chores Not difficult at all Not difficult at all Not difficult at all    BP Readings from Last 3 Encounters:  08/23/22 120/62  08/19/22 (!) 102/48  07/28/22 (!) 143/61    Physical Exam Vitals and nursing note reviewed.  HENT:     Head: Normocephalic.     Right Ear: Tympanic membrane normal.     Left Ear: Tympanic membrane normal.     Nose: Congestion and rhinorrhea present.     Mouth/Throat:     Mouth: Mucous membranes are dry.     Comments: Due to mouth breathing Neck:     Vascular: No hepatojugular reflux or JVD.  Cardiovascular:     Rate and Rhythm: Normal rate.     Heart sounds: No murmur heard.    No friction rub. No gallop.  Pulmonary:     Effort: No respiratory distress.     Breath sounds: Rales present. No wheezing or rhonchi.     Comments: Rales bilateral Abdominal:     Palpations: There is no hepatomegaly or splenomegaly.     Wt Readings from Last 3 Encounters:  08/23/22 213 lb (96.6 kg)  08/18/22 214 lb 11.7 oz (97.4 kg)  07/02/22 210 lb (95.3 kg)    BP 120/62   Pulse 64   Ht 5' (1.524 m)   Wt 213 lb (96.6 kg)   SpO2 93%   BMI 41.60 kg/m   Assessment and Plan:  1. Acute cough New onset.  Primarily in the morning when  patient is awakening.  Patient does sleep in a recliner and upon awaking has a nonproductive cough over the course of several minutes.  My concern is that he has been off the Lasix that he is on for pulmonary hypertension by nephrology.  I am hearing rhonchi and rales in the basilar lobes.  On review of chest x-ray there was on admission which noted increased prominence of interstitial opacities which may have presented as pulmonary edema or an atypical infection and we will proceed with further evaluation this needs to be considered given the cessation of Lasix or the nonuse of antibiotics. - DG Chest 2 View  2. Rhinorrhea New onset.  Likely secondary to recent continuance on a current daily basis of nasal oxygen.  Initially this was dry and a water bottle was added and now is where.  Patient is not really breathing through the nose so this is a constant source of aerosolized hydration which is pooling in the nose and is producing a Chronic rhinorrhea.  We will discuss the possibility of switching over to mask and the patient is mouth breathing primarily.  3. Chronic obstructive pulmonary disease, unspecified COPD type (HCC) Chronic.  Controlled.  Stable.  Followed by Dr. Raul Del and pulmonary.  As mentioned patient is on nasal oxygen but is mouth breathing primarily and I suspect that he is not getting the full effect of oxygen and this is causing the issue with the breathing and nasal sequelae. - DG Chest 2 View  4. AKI (acute kidney injury) ([place]) Admitted with AKI dehydration for which had a low blood pressure reading.  Both his losartan may be secondary to an increase potassium and  Lasix was discontinued.  We will refer to nephrology for follow-up on the AKI on CKD of which she is recovering from now - Ambulatory referral to Nephrology  5. Stage 3a chronic kidney disease ([place]) See above.  Chronic.  Controlled.  Stable.  Followed by Dr. Janann Colonel. - Ambulatory referral to Nephrology  6.  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  Followed by endocrinology.  Lantus was at 30 units and was in the 90-100 range and this was decreased to 20 units and now is in the 1 30-1 40 range fasting.  At this point time as not to control too tightly given the patient's fragile state we will continue his Lantus at current dosing of 20 units and will follow-up with endocrinology as to whether they will want to increase the Lantus or may be initiate short acting insulin with meals.  7. Anemia in stage 3a chronic kidney disease (HCC) Chronic.  But currently is receiving intravenous iron and he is feeling better with this.  This is improving his overall weakness as well as his general wellbeing.  8. Weakness Chronic.  Controlled.  Stable.  Is followed by neurology for MS.  Patient thinks the weakness is in the lower left leg primarily which puts him at risk for fall.  Since hydration and correction of the dehydration this is improved somewhat.  9. Primary pulmonary hypertension ([place]) Followed by nephrology.  Patient is on Lasix which has been discontinued and I am not certain but I am wondering if this is the reason for the diuresis.  We will obtain chest x-ray to see if interstitial opacities remain or if they have increased to suggest a worsening of this and whether the Lasix may need to be resumed on a lower dosing. - DG Chest 2 View - Ambulatory referral to Nephrology  I spent 45 minutes with this patient, More than 50% of that time was spent in face to face education, counseling and care coordination.   Otilio Miu, MD

## 2022-08-23 NOTE — Telephone Encounter (Signed)
Transition Care Management Follow-up Telephone Call Date of discharge and from where: [facility] 08/19/2022 How have you been since you were released from the hospital? "Alright other than nose running" Any questions or concerns? No  Items Reviewed: Did the pt receive and understand the discharge instructions provided? Yes  Medications obtained and verified? Yes  Other? No  Any new allergies since your discharge? No  Dietary orders reviewed? Yes Do you have support at home? Yes   Home Care and Equipment/Supplies: Were home health services ordered? Physical therapy If so, what is the name of the agency? [facility]  Has the agency set up a time to come to the patient's home? yes Were any new equipment or medical supplies ordered?  Yes: oxygen full time What is the name of the medical supply agency? [facility] Were you able to get the supplies/equipment? yes Do you have any questions related to the use of the equipment or supplies? No  Functional Questionnaire: (I = Independent and D = Dependent) ADLs: D   Bathing/Dressing- D  Meal Prep- D Eating- I   Maintaining continence- I  Transferring/Ambulation- D  Managing Meds- D  Follow up appointments reviewed:  PCP Hospital f/u appt confirmed? Yes  Scheduled to see Dr Otilio Miu on 08/23/2022 @ 3:00. [place] [facility] f/u appt confirmed? No  Are transportation arrangements needed? No  If their condition worsens, is the pt aware to call PCP or go to the Emergency Dept.? Yes Was the patient provided with contact information for the PCP's office or ED? Yes Was to pt encouraged to call back with questions or concerns? Yes

## 2022-08-23 NOTE — Telephone Encounter (Signed)
I called both [place] and Jenny Reichmann with [facility] [phone]- left a message to proceed with PT and let Giancarlos know that I called Jenny Reichmann and gave the "okay" to start it

## 2022-08-24 DIAGNOSIS — N1831 Chronic kidney disease, stage 3a: Secondary | ICD-10-CM | POA: Diagnosis not present

## 2022-08-24 DIAGNOSIS — G8929 Other chronic pain: Secondary | ICD-10-CM | POA: Diagnosis not present

## 2022-08-24 DIAGNOSIS — G35 Multiple sclerosis: Secondary | ICD-10-CM | POA: Diagnosis not present

## 2022-08-24 DIAGNOSIS — E1122 Type 2 diabetes mellitus with diabetic chronic kidney disease: Secondary | ICD-10-CM | POA: Diagnosis not present

## 2022-08-24 DIAGNOSIS — J449 Chronic obstructive pulmonary disease, unspecified: Secondary | ICD-10-CM | POA: Diagnosis not present

## 2022-08-24 DIAGNOSIS — I129 Hypertensive chronic kidney disease with stage 1 through stage 4 chronic kidney disease, or unspecified chronic kidney disease: Secondary | ICD-10-CM | POA: Diagnosis not present

## 2022-08-25 ENCOUNTER — Telehealth: Payer: Self-pay | Admitting: Neurology

## 2022-08-25 MED ORDER — BETASERON 0.3 MG ~~LOC~~ KIT: PACK | SUBCUTANEOUS | 4 refills | Status: DC

## 2022-08-25 NOTE — Telephone Encounter (Signed)
Pt wife [name]) is calling. Stating she need a prescription for medication Interferon Beta-1b (BETASERON) 0.3 MG KIT injection. Should be sent to [facility] [phone].

## 2022-08-25 NOTE — Telephone Encounter (Signed)
I have attempted to print this rx to be sent to pt assistance. We have put an IT ticket to have this fixed so we can print this rx.

## 2022-08-26 ENCOUNTER — Inpatient Hospital Stay: Payer: Medicare Other | Admitting: Family Medicine

## 2022-08-26 DIAGNOSIS — G35 Multiple sclerosis: Secondary | ICD-10-CM | POA: Diagnosis not present

## 2022-08-26 DIAGNOSIS — R0902 Hypoxemia: Secondary | ICD-10-CM | POA: Diagnosis not present

## 2022-08-26 DIAGNOSIS — J449 Chronic obstructive pulmonary disease, unspecified: Secondary | ICD-10-CM | POA: Diagnosis not present

## 2022-08-26 DIAGNOSIS — R0602 Shortness of breath: Secondary | ICD-10-CM | POA: Diagnosis not present

## 2022-08-26 DIAGNOSIS — G8929 Other chronic pain: Secondary | ICD-10-CM | POA: Diagnosis not present

## 2022-08-26 DIAGNOSIS — N1831 Chronic kidney disease, stage 3a: Secondary | ICD-10-CM | POA: Diagnosis not present

## 2022-08-26 DIAGNOSIS — I129 Hypertensive chronic kidney disease with stage 1 through stage 4 chronic kidney disease, or unspecified chronic kidney disease: Secondary | ICD-10-CM | POA: Diagnosis not present

## 2022-08-26 DIAGNOSIS — E1122 Type 2 diabetes mellitus with diabetic chronic kidney disease: Secondary | ICD-10-CM | POA: Diagnosis not present

## 2022-08-26 NOTE — Telephone Encounter (Signed)
Rx has been printed and placed on MD's desk for signature.

## 2022-08-26 NOTE — Telephone Encounter (Signed)
Pt wife is calling. Stated [facility] is calling about refilling Interferon Beta-1b (BETASERON) 0.3 MG KIT injection. Prescription should be sent to  [facility] [phone].Marland Kitchen

## 2022-08-30 NOTE — Telephone Encounter (Signed)
I called pt's wife back and updated that fax has been sent, confirmation received today.  She verbalized understanding and appreciation for the call.

## 2022-09-01 DIAGNOSIS — G8929 Other chronic pain: Secondary | ICD-10-CM | POA: Diagnosis not present

## 2022-09-01 DIAGNOSIS — E1122 Type 2 diabetes mellitus with diabetic chronic kidney disease: Secondary | ICD-10-CM | POA: Diagnosis not present

## 2022-09-01 DIAGNOSIS — N1831 Chronic kidney disease, stage 3a: Secondary | ICD-10-CM | POA: Diagnosis not present

## 2022-09-01 DIAGNOSIS — G35 Multiple sclerosis: Secondary | ICD-10-CM | POA: Diagnosis not present

## 2022-09-01 DIAGNOSIS — J449 Chronic obstructive pulmonary disease, unspecified: Secondary | ICD-10-CM | POA: Diagnosis not present

## 2022-09-01 DIAGNOSIS — I129 Hypertensive chronic kidney disease with stage 1 through stage 4 chronic kidney disease, or unspecified chronic kidney disease: Secondary | ICD-10-CM | POA: Diagnosis not present

## 2022-09-03 DIAGNOSIS — E1169 Type 2 diabetes mellitus with other specified complication: Secondary | ICD-10-CM | POA: Diagnosis not present

## 2022-09-03 DIAGNOSIS — E1142 Type 2 diabetes mellitus with diabetic polyneuropathy: Secondary | ICD-10-CM | POA: Diagnosis not present

## 2022-09-03 DIAGNOSIS — Z794 Long term (current) use of insulin: Secondary | ICD-10-CM | POA: Diagnosis not present

## 2022-09-03 DIAGNOSIS — N1831 Chronic kidney disease, stage 3a: Secondary | ICD-10-CM | POA: Diagnosis not present

## 2022-09-03 DIAGNOSIS — J449 Chronic obstructive pulmonary disease, unspecified: Secondary | ICD-10-CM | POA: Diagnosis not present

## 2022-09-03 DIAGNOSIS — G8929 Other chronic pain: Secondary | ICD-10-CM | POA: Diagnosis not present

## 2022-09-03 DIAGNOSIS — I129 Hypertensive chronic kidney disease with stage 1 through stage 4 chronic kidney disease, or unspecified chronic kidney disease: Secondary | ICD-10-CM | POA: Diagnosis not present

## 2022-09-03 DIAGNOSIS — E785 Hyperlipidemia, unspecified: Secondary | ICD-10-CM | POA: Diagnosis not present

## 2022-09-03 DIAGNOSIS — E1122 Type 2 diabetes mellitus with diabetic chronic kidney disease: Secondary | ICD-10-CM | POA: Diagnosis not present

## 2022-09-03 DIAGNOSIS — G35 Multiple sclerosis: Secondary | ICD-10-CM | POA: Diagnosis not present

## 2022-09-07 DIAGNOSIS — I129 Hypertensive chronic kidney disease with stage 1 through stage 4 chronic kidney disease, or unspecified chronic kidney disease: Secondary | ICD-10-CM | POA: Diagnosis not present

## 2022-09-07 DIAGNOSIS — E1122 Type 2 diabetes mellitus with diabetic chronic kidney disease: Secondary | ICD-10-CM | POA: Diagnosis not present

## 2022-09-07 DIAGNOSIS — G8929 Other chronic pain: Secondary | ICD-10-CM | POA: Diagnosis not present

## 2022-09-07 DIAGNOSIS — G35 Multiple sclerosis: Secondary | ICD-10-CM | POA: Diagnosis not present

## 2022-09-07 DIAGNOSIS — J449 Chronic obstructive pulmonary disease, unspecified: Secondary | ICD-10-CM | POA: Diagnosis not present

## 2022-09-07 DIAGNOSIS — N1831 Chronic kidney disease, stage 3a: Secondary | ICD-10-CM | POA: Diagnosis not present

## 2022-09-10 DIAGNOSIS — I129 Hypertensive chronic kidney disease with stage 1 through stage 4 chronic kidney disease, or unspecified chronic kidney disease: Secondary | ICD-10-CM | POA: Diagnosis not present

## 2022-09-10 DIAGNOSIS — G8929 Other chronic pain: Secondary | ICD-10-CM | POA: Diagnosis not present

## 2022-09-10 DIAGNOSIS — E1122 Type 2 diabetes mellitus with diabetic chronic kidney disease: Secondary | ICD-10-CM | POA: Diagnosis not present

## 2022-09-10 DIAGNOSIS — N1831 Chronic kidney disease, stage 3a: Secondary | ICD-10-CM | POA: Diagnosis not present

## 2022-09-10 DIAGNOSIS — G35 Multiple sclerosis: Secondary | ICD-10-CM | POA: Diagnosis not present

## 2022-09-10 DIAGNOSIS — J449 Chronic obstructive pulmonary disease, unspecified: Secondary | ICD-10-CM | POA: Diagnosis not present

## 2022-09-15 ENCOUNTER — Other Ambulatory Visit: Payer: Self-pay | Admitting: Neurology

## 2022-09-15 ENCOUNTER — Telehealth: Payer: Self-pay | Admitting: Neurology

## 2022-09-15 DIAGNOSIS — N1831 Chronic kidney disease, stage 3a: Secondary | ICD-10-CM | POA: Diagnosis not present

## 2022-09-15 DIAGNOSIS — J449 Chronic obstructive pulmonary disease, unspecified: Secondary | ICD-10-CM | POA: Diagnosis not present

## 2022-09-15 DIAGNOSIS — I129 Hypertensive chronic kidney disease with stage 1 through stage 4 chronic kidney disease, or unspecified chronic kidney disease: Secondary | ICD-10-CM | POA: Diagnosis not present

## 2022-09-15 DIAGNOSIS — E1122 Type 2 diabetes mellitus with diabetic chronic kidney disease: Secondary | ICD-10-CM | POA: Diagnosis not present

## 2022-09-15 DIAGNOSIS — G8929 Other chronic pain: Secondary | ICD-10-CM | POA: Diagnosis not present

## 2022-09-15 DIAGNOSIS — G35 Multiple sclerosis: Secondary | ICD-10-CM | POA: Diagnosis not present

## 2022-09-15 NOTE — Telephone Encounter (Signed)
Pt's wife is asking for a call to discuss the refill being denied on the traMADol (ULTRAM) 50 MG tablet

## 2022-09-15 NOTE — Telephone Encounter (Signed)
Per last refill. Med was filled at pharmacy on 08/26/2022 # 30 day supply with one refill on file.  This rx is being requested too early and he has one refill to claim at the pharmacy.   This was relayed to the pharmacy as well. Thanks!

## 2022-09-19 DIAGNOSIS — Z7984 Long term (current) use of oral hypoglycemic drugs: Secondary | ICD-10-CM | POA: Diagnosis not present

## 2022-09-19 DIAGNOSIS — Z9981 Dependence on supplemental oxygen: Secondary | ICD-10-CM | POA: Diagnosis not present

## 2022-09-19 DIAGNOSIS — Z794 Long term (current) use of insulin: Secondary | ICD-10-CM | POA: Diagnosis not present

## 2022-09-19 DIAGNOSIS — J9611 Chronic respiratory failure with hypoxia: Secondary | ICD-10-CM | POA: Diagnosis not present

## 2022-09-19 DIAGNOSIS — I129 Hypertensive chronic kidney disease with stage 1 through stage 4 chronic kidney disease, or unspecified chronic kidney disease: Secondary | ICD-10-CM | POA: Diagnosis not present

## 2022-09-19 DIAGNOSIS — Z7982 Long term (current) use of aspirin: Secondary | ICD-10-CM | POA: Diagnosis not present

## 2022-09-19 DIAGNOSIS — E1122 Type 2 diabetes mellitus with diabetic chronic kidney disease: Secondary | ICD-10-CM | POA: Diagnosis not present

## 2022-09-19 DIAGNOSIS — Z79891 Long term (current) use of opiate analgesic: Secondary | ICD-10-CM | POA: Diagnosis not present

## 2022-09-19 DIAGNOSIS — K219 Gastro-esophageal reflux disease without esophagitis: Secondary | ICD-10-CM | POA: Diagnosis not present

## 2022-09-19 DIAGNOSIS — Z9181 History of falling: Secondary | ICD-10-CM | POA: Diagnosis not present

## 2022-09-19 DIAGNOSIS — E1142 Type 2 diabetes mellitus with diabetic polyneuropathy: Secondary | ICD-10-CM | POA: Diagnosis not present

## 2022-09-19 DIAGNOSIS — G8929 Other chronic pain: Secondary | ICD-10-CM | POA: Diagnosis not present

## 2022-09-19 DIAGNOSIS — N1831 Chronic kidney disease, stage 3a: Secondary | ICD-10-CM | POA: Diagnosis not present

## 2022-09-19 DIAGNOSIS — F32A Depression, unspecified: Secondary | ICD-10-CM | POA: Diagnosis not present

## 2022-09-19 DIAGNOSIS — Z87891 Personal history of nicotine dependence: Secondary | ICD-10-CM | POA: Diagnosis not present

## 2022-09-19 DIAGNOSIS — J449 Chronic obstructive pulmonary disease, unspecified: Secondary | ICD-10-CM | POA: Diagnosis not present

## 2022-09-19 DIAGNOSIS — D631 Anemia in chronic kidney disease: Secondary | ICD-10-CM | POA: Diagnosis not present

## 2022-09-19 DIAGNOSIS — E785 Hyperlipidemia, unspecified: Secondary | ICD-10-CM | POA: Diagnosis not present

## 2022-09-19 DIAGNOSIS — G35 Multiple sclerosis: Secondary | ICD-10-CM | POA: Diagnosis not present

## 2022-09-22 DIAGNOSIS — G35 Multiple sclerosis: Secondary | ICD-10-CM | POA: Diagnosis not present

## 2022-09-22 DIAGNOSIS — G8929 Other chronic pain: Secondary | ICD-10-CM | POA: Diagnosis not present

## 2022-09-22 DIAGNOSIS — E1122 Type 2 diabetes mellitus with diabetic chronic kidney disease: Secondary | ICD-10-CM | POA: Diagnosis not present

## 2022-09-22 DIAGNOSIS — I129 Hypertensive chronic kidney disease with stage 1 through stage 4 chronic kidney disease, or unspecified chronic kidney disease: Secondary | ICD-10-CM | POA: Diagnosis not present

## 2022-09-22 DIAGNOSIS — J449 Chronic obstructive pulmonary disease, unspecified: Secondary | ICD-10-CM | POA: Diagnosis not present

## 2022-09-22 DIAGNOSIS — N1831 Chronic kidney disease, stage 3a: Secondary | ICD-10-CM | POA: Diagnosis not present

## 2022-09-27 ENCOUNTER — Other Ambulatory Visit: Payer: Self-pay | Admitting: Neurology

## 2022-09-27 MED ORDER — TRAMADOL HCL 50 MG PO TABS: ORAL_TABLET | ORAL | 0 refills | Status: DC

## 2022-09-27 NOTE — Addendum Note (Signed)
Addended by: Suzzanne Cloud on: 09/27/2022 01:40 PM   Modules accepted: Orders

## 2022-09-27 NOTE — Telephone Encounter (Signed)
Spoke with pt, pt scheduled for follow up with Sarah for 10/28/22 at 3:45pm

## 2022-09-27 NOTE — Telephone Encounter (Signed)
Meds ordered this encounter  Medications   traMADol (ULTRAM) 50 MG tablet    Sig: Take 1 tablet every 6 hours as needed    Dispense:  30 tablet    Refill:  0   I sent in Tramadol, Jeremy [name] was last seen in July 2023, will you ask him to schedule 6 months follow up around January 2024, since Jeremy [name] is requiring monthly Tramadol. Thanks

## 2022-09-27 NOTE — Telephone Encounter (Signed)
Wife calling now saying that [facility] should be messaging the request for the refill of the traMADol (ULTRAM) 50 MG tablet   at this time, pt is completely out.

## 2022-09-27 NOTE — Addendum Note (Signed)
Addended by: Verlin Grills on: 09/27/2022 01:17 PM   Modules accepted: Orders

## 2022-09-27 NOTE — Telephone Encounter (Signed)
[place] drug registry reviewed. Last refill 08/26/2022 # 30 day supply.   The 1 refill on file per drug registry could not filled at the pharmacy. Pharmacy sts no refills remain.

## 2022-09-28 ENCOUNTER — Telehealth: Payer: Self-pay | Admitting: Neurology

## 2022-09-28 DIAGNOSIS — E1122 Type 2 diabetes mellitus with diabetic chronic kidney disease: Secondary | ICD-10-CM | POA: Diagnosis not present

## 2022-09-28 DIAGNOSIS — N1831 Chronic kidney disease, stage 3a: Secondary | ICD-10-CM | POA: Diagnosis not present

## 2022-09-28 DIAGNOSIS — J449 Chronic obstructive pulmonary disease, unspecified: Secondary | ICD-10-CM | POA: Diagnosis not present

## 2022-09-28 DIAGNOSIS — I129 Hypertensive chronic kidney disease with stage 1 through stage 4 chronic kidney disease, or unspecified chronic kidney disease: Secondary | ICD-10-CM | POA: Diagnosis not present

## 2022-09-28 DIAGNOSIS — G8929 Other chronic pain: Secondary | ICD-10-CM | POA: Diagnosis not present

## 2022-09-28 DIAGNOSIS — G35 Multiple sclerosis: Secondary | ICD-10-CM | POA: Diagnosis not present

## 2022-09-28 NOTE — Telephone Encounter (Signed)
Noted, when pt calls back please offer sooner appt.

## 2022-09-28 NOTE — Telephone Encounter (Signed)
Phone rep called pt to see if he wanted to come in earlier to discuss additional pills re: traMADol (ULTRAM) 50 MG tablet . Phone rep was unable to leave vm, a message came on stating call could not be completed at this time and to try call again.

## 2022-09-28 NOTE — Telephone Encounter (Signed)
Pt's wife called stating that when they picked up the pt's traMADol (ULTRAM) 50 MG tablet they were only given 30 pills and she states that it is not enough for the pt due to him having to take it QID

## 2022-10-05 ENCOUNTER — Telehealth: Payer: Self-pay | Admitting: Neurology

## 2022-10-05 ENCOUNTER — Encounter: Payer: Self-pay | Admitting: Neurology

## 2022-10-05 ENCOUNTER — Ambulatory Visit (INDEPENDENT_AMBULATORY_CARE_PROVIDER_SITE_OTHER): Payer: Medicare Other | Admitting: Neurology

## 2022-10-05 VITALS — BP 144/69 | HR 87

## 2022-10-05 DIAGNOSIS — R269 Unspecified abnormalities of gait and mobility: Secondary | ICD-10-CM

## 2022-10-05 DIAGNOSIS — G35 Multiple sclerosis: Secondary | ICD-10-CM | POA: Diagnosis not present

## 2022-10-05 DIAGNOSIS — G609 Hereditary and idiopathic neuropathy, unspecified: Secondary | ICD-10-CM | POA: Diagnosis not present

## 2022-10-05 MED ORDER — TRAMADOL HCL 50 MG PO TABS: ORAL_TABLET | ORAL | 0 refills | Status: DC

## 2022-10-05 NOTE — Telephone Encounter (Signed)
Referral for pain clinic sent through [facility] to [place].  Requested in EPIC by neurologist.

## 2022-10-05 NOTE — Patient Instructions (Signed)
I will send in the tramadol for the rest of the month  I will place a referral for pain clinic, please check into it

## 2022-10-05 NOTE — Progress Notes (Addendum)
Patient: Jeremy [name] Date of Birth: 10-Jun-1951  Reason for Visit: Follow up History from: Patient, wife  Primary Neurologist: Dr. Terrace [name]  ASSESSMENT AND PLAN 71 y.o. year old male   1.  Relapsing remitting multiple sclerosis 2.  Gait abnormality 3.  Diabetic peripheral neuropathy  -Continues with generalized weakness, deconditioning, overall decline, remains on tramadol 50 mg 4 times daily for chronic pain, early in PT following hospitalization in October for weakness -I will refer him to a pain clinic for evaluation and management of his chronic pain, also on Lyrica from PCP  -I will send in # 90 tablets of Tramadol 50 mg, he got # 30 tablets 09/27/22, usually gets # 120 tablets a month -Continue Betaseron, he desires to remain on, reported worsening gait when discontinued in 2020 -Most recent MRI of the brain in 2018 showed multiple subcortical, periventricular nonenhancing lesion, no change compared to previous scan in 2015, no contrast-enhancement  Addendum 03/10/2023 SS: I will order a lightweight wheelchair.  He has mobility limitation that impairs his ability to perform independent ADLs.  He can also use a cane, or walker.  He has a caregiver who can provide assistance with a lightweight wheelchair.  He and his wife are both elderly and a lightweight wheelchair is absolutely necessary.  He has generalized weakness and gait is significantly impaired.  Orders Placed This Encounter  Procedures   For home use only DME lightweight manual wheelchair with seat cushion   Ambulatory referral to Pain Clinic     HISTORY  He has PMHx of  diabetes, hypertension and multiple sclerosis.  The diagnosis of multiple sclerosis was confirmed by abnormal MRI brain and cervical, and a spinal fluid testing.  He was initially followed by Dr. Nash [name] in 2008. Last seen in this office Oct 2014, He continues to have gait difficulties and relies on a cane for ambulation.  He has had no problems with his  vision or bowel/bladder control.   He has had occasional falls.He has ongoing pain in his legs. Last MS flare was over 2 years ago. He is currently on Betaseron every other day, treatment started in 2007.    MRI cervical in 2013 with findings consistent with MS plaques and no enhancement,mild spinal stenosis.   MRI of the brain with prominent white matter changes consistent with MS. No active areas of demyelination. He continues to have problems with low     The initial symptom was acute onset of left leg more than arm weakness, gait difficulty  in 2006.  This eventually led to the diagnosis of relapsing remitting multiple sclerosis. He has been receiving Betaseron treatment since early 2007.  He has tolerated the medication well.  He has had no problems with lipoatrophy or skin necrosis. In September 2009, he developed GI bleeding.  Workup had demonstrated colon inflammation and obstruction.He underwent colectomy and recovered well from that procedure.   He continues to have gait difficulties and relies on a cane for ambulation.  He has had no problems with his vision or bowel/bladder control. He does have chronic extremity pain, which has been managed with Lyrica and Tramadol.    He uses an Art gallery manager for situations in which he would need to walk long distances. He has had occasional falls.He has ongoing pain in his legs   UPDATE 07/2014: He was admitted to hospital in Nov 2014 for anemia, low blood count, was evaluated by hematologist in [facility], now has recovered, he still has moderate gait  difficulty.also complains bilateral lower extremity spasticity, is taking Lyrica, there was no clinical flareups, last MRI was in 2013, there was significant lesions in brain, and cervical spine    UPDATE 09/03/2014: He has tried Baclofen 10mg  qhs, complains of excessive sleepiness, could not tolerate it, he still mows the yard, the most limitation is his gait difficulty,he denies bowel and bladder  incontinence.   We have reviewed MRI togather, MRI scan of brain showing periventricular and subcortical white matter hyperintensities consistent with multiple sclerosis. Presence of atrophy of corpus callosum indicates chronic disease. No significant change compared with MRI 06/02/2012    MRI cervical spine showing mild spondylitic changes descibed above most prominent at C 3-4 where there is moderate left formaminal narrowing. Ill defined spinal cord hypertintensities at C 3-4 to c 6-7 likely represent chronic multiple sclerosis inactive plaques. No significant change compared with MRI C spine 06/02/2012   UPDATE 11//17/16 [name], 71 year old male returns for follow-up. He was last seen in this office 03/06/15.  He was diagnosed with multiple sclerosis in 2006. He is currently on Betaseron every other day without injection site issues.. He still remains fairly active mowing the yard, denies any bowel or bladder incontinence. He denies any sensory changes,  double vision speech or swallowing difficulty. He ambulates with a cane. He has had a couple of falls since last seen. He returns for reevaluation   UPDATE May 17th 2017:YY He is with his wife at today's clinical visit, he continued to receive Betaseron every other day without significant side effect, he has significant gait difficulty, left side is weaker, complains of chronic fatigue, chronic low back pain, bilateral lower extremity neuropathic pain, taking Lyrica, tramadol as needed,  Reviewed laboratory evaluation, A1c 7.9, CMP showed elevated creatinine 1.4, normal CBC  We again personally reviewed MRI of the scan without contrast, most recent was in 2015, MRI of the brain showed multiple supratentorium lesions, no contrast enhancement, MRI of cervical spine showed ill-defined C3-C7 cord lesion, no contrast enhancement, no significant change compared to previous scans   Update September 22, 2017:YY Creatinine 1.27 in July 2018, he is overall  doing very well, mild gait abnormality, use cane, still active at home, providing normal, no bowel bladder incontinence, diabetes, diabetic peripheral neuropathy, using Betaseron, last MRI was in 2015,   UPDATE May 01 2019: He is overall stable, continue ambulate with a cane, slow worsening gait abnormality, heat intolerance, I personally reviewed MRI of the brain with without contrast in December 2018: Multiple subcortical, periventricular nonenhancing lesion, no significant change compared to previous scan in October 2015   Laboratory evaluations in September 2019: Hg 12.4,  LDL 84, A1C 5.7   UPDATE May 06 2020: He is accompanied by his wife at today's clinic visit, he is overall stable, only slow as expected decline with aging, rely on his cane more, denies bowel and bladder incontinence, he tolerated Betaseron well, does not want to change   We reviewed MRI of the brain with without contrast together, most recently in December 2018, multiple supratentorial MS lesions, no change compared to previous scan in October 2015, T1 black holes, no contrast-enhancement   Laboratory evaluation in March 2021: CBC showed anemia hemoglobin of 10.9, CMP abnormal creatinine 1.37, lipid panel, LDL 68   UPDATE May 07 2021: He is accompanied by his wife at today's visit, suffered COVID in January 2022, severely symptomatic, following that, he has worsening shortness of breath, previous smoker, was seen by pulmonologist, suspected pulmonary hypertension,  oxygen desaturation, questionable residual effects from COVID, he was started on portable oxygen at 2 L, gave inhaler  Patient complains of worsening functional status since then, generalized fatigue, lack of stamina, still to ambulate with walker   Laboratory evaluation in June 2022, A1c 4.9, TSH 1.95, CMP creat 1.31, LDL 69, Hg. 9.8  Update May 04, 2022 SS: Here with his wife, today is his 71th birthday. Now has oxygen continuously. Gets fatigued easily,  balance is poor. Has transport wheelchair. Remains on Betaseron, gets drug company assistance. Hospitalized in Dec, was getting iron infusion, HBG was 5. PNA in April. Takes Lyrica 200 mg twice daily, Tramadol PRN. Has cane, gets SOB, mostly uses rollator. Rarely drives a car.  Remains on Lyrica for neuropathy, reports PCP mentioned will no longer refill.   Update October 05, 2022 SS: Continues to use Tramadol 4 tablets daily for chronic pain. Uses rolling walker short distances in the home. Wheelchair when he goes out. Uses Betaseron for MS. On continuous nasal cannula oxygen. Admitted October for weakness due to low BP? Generalized decline? Gets iron infusion. HGB 8.3, creatinine 2.07. A1C 4.6, did blood sugar get too low? CT Head showed no acute abnormalities. Doing home PT.   REVIEW OF SYSTEMS: Out of a complete 14 system review of symptoms, the patient complains only of the following symptoms, and all other reviewed systems are negative.  See HPI  ALLERGIES: Allergies  Allergen Reactions   Betadine [Povidone Iodine]     HOME MEDICATIONS: Outpatient Medications Prior to Visit  Medication Sig Dispense Refill   albuterol (VENTOLIN HFA) 108 (90 Base) MCG/ACT inhaler Inhale 2 puffs into the lungs in the morning, at noon, in the evening, and at bedtime.     aspirin 81 MG tablet Take 81 mg by mouth daily.     B-D INSULIN SYRINGE 1CC/25GX1" 25G X 1" 1 ML MISC USE AS DIRECTED 10 each 2   ferrous sulfate 325 (65 FE) MG tablet Take 325 mg by mouth daily with breakfast.     gemfibrozil (LOPID) 600 MG tablet TAKE 1 TABLET BY MOUTH TWICE  DAILY BEFORE MEALS 180 tablet 1   insulin glargine (LANTUS) 100 UNIT/ML Solostar Pen Inject 20 Units into the skin at bedtime. 15 mL 11   Interferon Beta-1b (BETASERON) 0.3 MG KIT injection Inject subcutaneously 1 syringe every other day. 45 kit 4   loratadine (CLARITIN) 10 MG tablet Take 1 tablet (10 mg total) by mouth daily. 90 tablet 1   metFORMIN (GLUCOPHAGE)  500 MG tablet Take 500 mg by mouth 2 (two) times daily.     mupirocin ointment (BACTROBAN) 2 % Apply 1 Application topically 2 (two) times daily. Apply to effected area bid 30 g 1   omeprazole (PRILOSEC) 40 MG capsule TAKE 1 CAPSULE BY MOUTH DAILY 90 capsule 1   pregabalin (LYRICA) 200 MG capsule Take 1 capsule (200 mg total) by mouth 2 (two) times daily. 60 capsule 3   sertraline (ZOLOFT) 50 MG tablet Take 1/2 tablet daily 45 tablet 1   simvastatin (ZOCOR) 40 MG tablet Take 1 tablet (40 mg total) by mouth daily. 90 tablet 1   sodium bicarbonate 650 MG tablet Take 650 mg by mouth 2 (two) times daily.     traMADol (ULTRAM) 50 MG tablet Take 1 tablet every 6 hours as needed 30 tablet 0   losartan (COZAAR) 25 MG tablet Hold until followup with outpatient provider due to dehydration and low normal blood pressure. (Patient not taking: Reported  on 08/23/2022) 90 tablet 1   No facility-administered medications prior to visit.    PAST MEDICAL HISTORY: Past Medical History:  Diagnosis Date   Chronic pain    Depression    Diabetes (HCC)    GERD (gastroesophageal reflux disease)    Hyperlipemia    Hypertension    MS (multiple sclerosis) (HCC)     PAST SURGICAL HISTORY: Past Surgical History:  Procedure Laterality Date   COLECTOMY  06-2008   COLONOSCOPY  2015   normal   COLONOSCOPY WITH PROPOFOL N/A 09/10/2021   Procedure: COLONOSCOPY WITH PROPOFOL;  Surgeon: Midge Minium, MD;  Location: [facility] ENDOSCOPY;  Service: Endoscopy;  Laterality: N/A;   ESOPHAGOGASTRODUODENOSCOPY (EGD) WITH PROPOFOL N/A 09/10/2021   Procedure: ESOPHAGOGASTRODUODENOSCOPY (EGD) WITH PROPOFOL;  Surgeon: Midge Minium, MD;  Location: [facility] ENDOSCOPY;  Service: Endoscopy;  Laterality: N/A;   GIVENS CAPSULE STUDY N/A 09/25/2021   Procedure: GIVENS CAPSULE STUDY;  Surgeon: Wyline Mood, MD;  Location: [facility] ENDOSCOPY;  Service: Gastroenterology;  Laterality: N/A;    FAMILY HISTORY: Family History  Problem Relation Age of Onset    Lung cancer Mother    Heart attack Father    Heart attack Brother    COPD Brother    Diabetes Brother    Esophageal cancer Nephew     SOCIAL HISTORY: Social History   Socioeconomic History   Marital status: Married    Spouse name: Olegario Messier   Number of children: 2   Years of education: GED   Highest education level: Not on file  Occupational History    Comment: Disabled  Tobacco Use   Smoking status: Former    Packs/day: 1.50    Years: 30.00    Total pack years: 45.00    Types: Cigarettes    Quit date: 01/23/2006    Years since quitting: 16.7   Smokeless tobacco: Never   Tobacco comments:    N/A  Vaping Use   Vaping Use: Never used  Substance and Sexual Activity   Alcohol use: Not Currently    Comment: rare; maybe 2 beers a year   Drug use: No   Sexual activity: Not Currently  Other Topics Concern   Not on file  Social History Narrative   Patient is disabled.    Patient lives with his wife Luken Phenis.    Patient has 2 children.       Social Determinants of Health   Financial Resource Strain: Low Risk  (11/16/2021)   Overall Financial Resource Strain (CARDIA)    Difficulty of Paying Living Expenses: Not hard at all  Food Insecurity: No Food Insecurity (08/18/2022)   Hunger Vital Sign    Worried About Running Out of Food in the Last Year: Never true    Ran Out of Food in the Last Year: Never true  Transportation Needs: No Transportation Needs (08/18/2022)   PRAPARE - Administrator, Civil Service (Medical): No    Lack of Transportation (Non-Medical): No  Physical Activity: Sufficiently Active (11/16/2021)   Exercise Vital Sign    Days of Exercise per Week: 7 days    Minutes of Exercise per Session: 30 min  Stress: No Stress Concern Present (11/16/2021)   [employer] of Occupational Health - Occupational Stress Questionnaire    Feeling of Stress : Not at all  Social Connections: Moderately Isolated (11/16/2021)   Social Connection and  Isolation Panel [NHANES]    Frequency of Communication with Friends and Family: More than three times a week  Frequency of Social Gatherings with Friends and Family: Once a week    Attends Religious Services: Never    Database administrator or Organizations: No    Attends Banker Meetings: Never    Marital Status: Married  Catering manager Violence: Not At Risk (08/18/2022)   Humiliation, Afraid, Rape, and Kick questionnaire    Fear of Current or Ex-Partner: No    Emotionally Abused: No    Physically Abused: No    Sexually Abused: No   PHYSICAL EXAM  Vitals:   10/05/22 1405  BP: (!) 144/69  Pulse: 87   There is no height or weight on file to calculate BMI.  Generalized: Well developed, in no acute distress, wearing continuous oxygen Neurological examination  Mentation: Alert oriented to time, place, history taking. Follows all commands speech and language fluent, gets short of breath with prolonged talking Cranial nerve II-XII: Pupils were equal round reactive to light. Extraocular movements were full, visual field were full on confrontational test. Facial sensation and strength were normal.  Head turning and shoulder shrug were normal and symmetric. Motor: Mild generalized weakness bilateral lower extremities, increased spasticity lower extremities more on the left Sensory: Length dependent sensory deficit to soft touch Coordination: Cerebellar testing reveals good finger-nose-finger bilaterally, hard to perform heel to shin Gait and station: In wheelchair, was not ambulated today  DIAGNOSTIC DATA (LABS, IMAGING, TESTING) - I reviewed patient records, labs, notes, testing and imaging myself where available.  Lab Results  Component Value Date   WBC 10.5 08/18/2022   HGB 8.3 (L) 08/18/2022   HCT 27.3 (L) 08/18/2022   MCV 96.8 08/18/2022   PLT 195 08/18/2022      Component Value Date/Time   NA 139 08/23/2022 1633   NA 138 02/15/2022 1416   NA 143 08/31/2013  0417   K 4.5 08/23/2022 1633   K 3.5 08/31/2013 0417   CL 111 08/23/2022 1633   CL 109 (H) 08/31/2013 0417   CO2 24 08/23/2022 1633   CO2 28 08/31/2013 0417   GLUCOSE 104 (H) 08/23/2022 1633   GLUCOSE 133 (H) 08/31/2013 0417   BUN 33 (H) 08/23/2022 1633   BUN 33 (H) 02/15/2022 1416   BUN 39 (H) 08/31/2013 0417   CREATININE 1.30 (H) 08/23/2022 1633   CREATININE 1.53 (H) 08/31/2013 0417   CALCIUM 8.3 (L) 08/23/2022 1633   CALCIUM 8.9 08/31/2013 0417   PROT 7.1 08/18/2022 1145   PROT 6.9 12/25/2020 1058   PROT 6.2 (L) 08/31/2013 0417   ALBUMIN 2.7 (L) 08/23/2022 1633   ALBUMIN 3.6 (L) 02/15/2022 1416   ALBUMIN 2.9 (L) 08/31/2013 0417   AST 20 08/18/2022 1145   AST 35 08/31/2013 0417   ALT 10 08/18/2022 1145   ALT 15 08/31/2013 0417   ALKPHOS 61 08/18/2022 1145   ALKPHOS 60 08/31/2013 0417   BILITOT 0.5 08/18/2022 1145   BILITOT <0.2 12/25/2020 1058   BILITOT 0.6 08/31/2013 0417   GFRNONAA 59 (L) 08/23/2022 1633   GFRNONAA 48 (L) 08/31/2013 0417   GFRAA 61 01/15/2020 0954   GFRAA 56 (L) 08/31/2013 0417   Lab Results  Component Value Date   CHOL 103 07/10/2021   HDL 22 (L) 07/10/2021   LDLCALC 35 07/10/2021   TRIG 228 (H) 07/10/2021   CHOLHDL 4.7 07/10/2021   Lab Results  Component Value Date   HGBA1C 4.8 04/05/2022   Lab Results  Component Value Date   VITAMINB12 1,525 (H) 06/14/2022   Lab Results  Component Value Date   TSH 1.90 03/30/2021    Margie Ege, AGNP-C, DNP 10/05/2022, 2:49 PM [facility] [address] [place], [place] [address] [phone]

## 2022-10-07 DIAGNOSIS — I129 Hypertensive chronic kidney disease with stage 1 through stage 4 chronic kidney disease, or unspecified chronic kidney disease: Secondary | ICD-10-CM | POA: Diagnosis not present

## 2022-10-07 DIAGNOSIS — E1122 Type 2 diabetes mellitus with diabetic chronic kidney disease: Secondary | ICD-10-CM | POA: Diagnosis not present

## 2022-10-07 DIAGNOSIS — G35 Multiple sclerosis: Secondary | ICD-10-CM | POA: Diagnosis not present

## 2022-10-07 DIAGNOSIS — G8929 Other chronic pain: Secondary | ICD-10-CM | POA: Diagnosis not present

## 2022-10-07 DIAGNOSIS — J449 Chronic obstructive pulmonary disease, unspecified: Secondary | ICD-10-CM | POA: Diagnosis not present

## 2022-10-07 DIAGNOSIS — N1831 Chronic kidney disease, stage 3a: Secondary | ICD-10-CM | POA: Diagnosis not present

## 2022-10-10 NOTE — Progress Notes (Signed)
Chart reviewed, agree with pain management

## 2022-10-13 DIAGNOSIS — G35 Multiple sclerosis: Secondary | ICD-10-CM | POA: Diagnosis not present

## 2022-10-13 DIAGNOSIS — G8929 Other chronic pain: Secondary | ICD-10-CM | POA: Diagnosis not present

## 2022-10-13 DIAGNOSIS — N1831 Chronic kidney disease, stage 3a: Secondary | ICD-10-CM | POA: Diagnosis not present

## 2022-10-13 DIAGNOSIS — J449 Chronic obstructive pulmonary disease, unspecified: Secondary | ICD-10-CM | POA: Diagnosis not present

## 2022-10-13 DIAGNOSIS — E1122 Type 2 diabetes mellitus with diabetic chronic kidney disease: Secondary | ICD-10-CM | POA: Diagnosis not present

## 2022-10-13 DIAGNOSIS — I129 Hypertensive chronic kidney disease with stage 1 through stage 4 chronic kidney disease, or unspecified chronic kidney disease: Secondary | ICD-10-CM | POA: Diagnosis not present

## 2022-10-15 ENCOUNTER — Inpatient Hospital Stay: Payer: Medicare Other | Attending: Oncology

## 2022-10-15 DIAGNOSIS — N1831 Chronic kidney disease, stage 3a: Secondary | ICD-10-CM | POA: Insufficient documentation

## 2022-10-15 DIAGNOSIS — D631 Anemia in chronic kidney disease: Secondary | ICD-10-CM | POA: Insufficient documentation

## 2022-10-15 DIAGNOSIS — D500 Iron deficiency anemia secondary to blood loss (chronic): Secondary | ICD-10-CM | POA: Diagnosis not present

## 2022-10-15 LAB — CBC WITH DIFFERENTIAL/PLATELET
Abs Immature Granulocytes: 0.04 10*3/uL (ref 0.00–0.07)
Basophils Absolute: 0.1 10*3/uL (ref 0.0–0.1)
Basophils Relative: 1 %
Eosinophils Absolute: 0.3 10*3/uL (ref 0.0–0.5)
Eosinophils Relative: 6 %
HCT: 19.5 % — ABNORMAL LOW (ref 39.0–52.0)
Hemoglobin: 5.2 g/dL — ABNORMAL LOW (ref 13.0–17.0)
Immature Granulocytes: 1 %
Lymphocytes Relative: 17 %
Lymphs Abs: 0.8 10*3/uL (ref 0.7–4.0)
MCH: 22.4 pg — ABNORMAL LOW (ref 26.0–34.0)
MCHC: 26.7 g/dL — ABNORMAL LOW (ref 30.0–36.0)
MCV: 84.1 fL (ref 80.0–100.0)
Monocytes Absolute: 0.7 10*3/uL (ref 0.1–1.0)
Monocytes Relative: 15 %
Neutro Abs: 2.8 10*3/uL (ref 1.7–7.7)
Neutrophils Relative %: 60 %
Platelets: 156 10*3/uL (ref 150–400)
RBC: 2.32 MIL/uL — ABNORMAL LOW (ref 4.22–5.81)
RDW: 16.9 % — ABNORMAL HIGH (ref 11.5–15.5)
WBC: 4.5 10*3/uL (ref 4.0–10.5)
nRBC: 0 % (ref 0.0–0.2)

## 2022-10-15 LAB — RETIC PANEL
Immature Retic Fract: 40.6 % — ABNORMAL HIGH (ref 2.3–15.9)
RBC.: 2.32 MIL/uL — ABNORMAL LOW (ref 4.22–5.81)
Retic Count, Absolute: 100.7 10*3/uL (ref 19.0–186.0)
Retic Ct Pct: 4.3 % — ABNORMAL HIGH (ref 0.4–3.1)
Reticulocyte Hemoglobin: 19.9 pg — ABNORMAL LOW (ref >27.9)

## 2022-10-16 ENCOUNTER — Other Ambulatory Visit: Payer: Self-pay

## 2022-10-16 ENCOUNTER — Encounter: Payer: Self-pay | Admitting: Internal Medicine

## 2022-10-16 ENCOUNTER — Observation Stay
Admission: EM | Admit: 2022-10-16 | Discharge: 2022-10-17 | Disposition: A | Discharge status: Home / Self Care | Payer: Medicare Other | Attending: Hospitalist | Admitting: Hospitalist

## 2022-10-16 DIAGNOSIS — D649 Anemia, unspecified: Secondary | ICD-10-CM | POA: Diagnosis not present

## 2022-10-16 DIAGNOSIS — Z7982 Long term (current) use of aspirin: Secondary | ICD-10-CM | POA: Insufficient documentation

## 2022-10-16 DIAGNOSIS — K922 Gastrointestinal hemorrhage, unspecified: Secondary | ICD-10-CM | POA: Diagnosis not present

## 2022-10-16 DIAGNOSIS — Z794 Long term (current) use of insulin: Secondary | ICD-10-CM | POA: Insufficient documentation

## 2022-10-16 DIAGNOSIS — J449 Chronic obstructive pulmonary disease, unspecified: Secondary | ICD-10-CM | POA: Diagnosis not present

## 2022-10-16 DIAGNOSIS — I129 Hypertensive chronic kidney disease with stage 1 through stage 4 chronic kidney disease, or unspecified chronic kidney disease: Secondary | ICD-10-CM | POA: Diagnosis not present

## 2022-10-16 DIAGNOSIS — N1831 Chronic kidney disease, stage 3a: Secondary | ICD-10-CM | POA: Diagnosis not present

## 2022-10-16 DIAGNOSIS — E1142 Type 2 diabetes mellitus with diabetic polyneuropathy: Secondary | ICD-10-CM | POA: Insufficient documentation

## 2022-10-16 DIAGNOSIS — Z9981 Dependence on supplemental oxygen: Secondary | ICD-10-CM | POA: Diagnosis not present

## 2022-10-16 DIAGNOSIS — G35 Multiple sclerosis: Secondary | ICD-10-CM

## 2022-10-16 DIAGNOSIS — Z79899 Other long term (current) drug therapy: Secondary | ICD-10-CM | POA: Insufficient documentation

## 2022-10-16 DIAGNOSIS — E1122 Type 2 diabetes mellitus with diabetic chronic kidney disease: Secondary | ICD-10-CM | POA: Insufficient documentation

## 2022-10-16 DIAGNOSIS — Z7984 Long term (current) use of oral hypoglycemic drugs: Secondary | ICD-10-CM | POA: Diagnosis not present

## 2022-10-16 DIAGNOSIS — Z87891 Personal history of nicotine dependence: Secondary | ICD-10-CM | POA: Insufficient documentation

## 2022-10-16 DIAGNOSIS — D500 Iron deficiency anemia secondary to blood loss (chronic): Principal | ICD-10-CM | POA: Insufficient documentation

## 2022-10-16 LAB — BASIC METABOLIC PANEL
Anion gap: 9 (ref 5–15)
BUN: 29 mg/dL — ABNORMAL HIGH (ref 8–23)
CO2: 23 mmol/L (ref 22–32)
Calcium: 8.5 mg/dL — ABNORMAL LOW (ref 8.9–10.3)
Chloride: 108 mmol/L (ref 98–111)
Creatinine, Ser: 1.43 mg/dL — ABNORMAL HIGH (ref 0.61–1.24)
GFR, Estimated: 53 mL/min — ABNORMAL LOW (ref >60)
Glucose, Bld: 143 mg/dL — ABNORMAL HIGH (ref 70–99)
Potassium: 4.4 mmol/L (ref 3.5–5.1)
Sodium: 140 mmol/L (ref 135–145)

## 2022-10-16 LAB — PROTIME-INR
INR: 1.4 — ABNORMAL HIGH (ref 0.8–1.2)
Prothrombin Time: 17.1 seconds — ABNORMAL HIGH (ref 11.4–15.2)

## 2022-10-16 LAB — CBC WITH DIFFERENTIAL/PLATELET
Abs Immature Granulocytes: 0.01 10*3/uL (ref 0.00–0.07)
Basophils Absolute: 0.1 10*3/uL (ref 0.0–0.1)
Basophils Relative: 2 %
Eosinophils Absolute: 0.2 10*3/uL (ref 0.0–0.5)
Eosinophils Relative: 5 %
HCT: 19.9 % — ABNORMAL LOW (ref 39.0–52.0)
Hemoglobin: 5.2 g/dL — ABNORMAL LOW (ref 13.0–17.0)
Immature Granulocytes: 0 %
Lymphocytes Relative: 15 %
Lymphs Abs: 0.5 10*3/uL — ABNORMAL LOW (ref 0.7–4.0)
MCH: 22.3 pg — ABNORMAL LOW (ref 26.0–34.0)
MCHC: 26.1 g/dL — ABNORMAL LOW (ref 30.0–36.0)
MCV: 85.4 fL (ref 80.0–100.0)
Monocytes Absolute: 0.4 10*3/uL (ref 0.1–1.0)
Monocytes Relative: 13 %
Neutro Abs: 2.1 10*3/uL (ref 1.7–7.7)
Neutrophils Relative %: 65 %
Platelets: 152 10*3/uL (ref 150–400)
RBC: 2.33 MIL/uL — ABNORMAL LOW (ref 4.22–5.81)
RDW: 16.9 % — ABNORMAL HIGH (ref 11.5–15.5)
WBC: 3.2 10*3/uL — ABNORMAL LOW (ref 4.0–10.5)
nRBC: 0 % (ref 0.0–0.2)

## 2022-10-16 LAB — IRON AND TIBC
Iron: 22 ug/dL — ABNORMAL LOW (ref 45–182)
Iron: 127 ug/dL (ref 45–182)
Saturation Ratios: 5 % — ABNORMAL LOW (ref 17.9–39.5)
Saturation Ratios: 28 % (ref 17.9–39.5)
TIBC: 447 ug/dL (ref 250–450)
TIBC: 459 ug/dL — ABNORMAL HIGH (ref 250–450)
UIBC: 425 ug/dL
UIBC: 332 ug/dL

## 2022-10-16 LAB — RETICULOCYTES
Immature Retic Fract: 31.5 % — ABNORMAL HIGH (ref 2.3–15.9)
RBC.: 2.34 MIL/uL — ABNORMAL LOW (ref 4.22–5.81)
Retic Count, Absolute: 110.9 10*3/uL (ref 19.0–186.0)
Retic Ct Pct: 4.7 % — ABNORMAL HIGH (ref 0.4–3.1)

## 2022-10-16 LAB — PREPARE RBC (CROSSMATCH)

## 2022-10-16 LAB — FERRITIN: Ferritin: 9 ng/mL — ABNORMAL LOW (ref 24–336)

## 2022-10-16 MED ORDER — ONDANSETRON HCL 4 MG/2ML IJ SOLN: 4.0000 mg | Freq: Four times a day (QID) | INTRAMUSCULAR | Status: DC | PRN

## 2022-10-16 MED ORDER — SODIUM CHLORIDE 0.9 % IV SOLN
10.0000 mL/h | Freq: Once | INTRAVENOUS | Status: AC
  Administered 2022-10-16: 10 mL/h via INTRAVENOUS

## 2022-10-16 MED ORDER — SIMVASTATIN 10 MG PO TABS
40.0000 mg | ORAL_TABLET | Freq: Every day | ORAL | Status: DC
  Administered 2022-10-17: 40 mg via ORAL
  Filled 2022-10-16: qty 4

## 2022-10-16 MED ORDER — PREGABALIN 75 MG PO CAPS
200.0000 mg | ORAL_CAPSULE | Freq: Two times a day (BID) | ORAL | Status: DC
  Administered 2022-10-16 – 2022-10-17 (×2): 200 mg via ORAL
  Filled 2022-10-16 (×2): qty 1

## 2022-10-16 MED ORDER — TRAMADOL HCL 50 MG PO TABS
50.0000 mg | ORAL_TABLET | Freq: Four times a day (QID) | ORAL | Status: DC | PRN
  Administered 2022-10-16 – 2022-10-17 (×2): 50 mg via ORAL
  Filled 2022-10-16 (×2): qty 1

## 2022-10-16 MED ORDER — PANTOPRAZOLE 80MG IVPB - SIMPLE MED
80.0000 mg | Freq: Once | INTRAVENOUS | Status: AC
  Administered 2022-10-16: 80 mg via INTRAVENOUS
  Filled 2022-10-16: qty 100

## 2022-10-16 MED ORDER — ASPIRIN 81 MG PO TBEC
81.0000 mg | DELAYED_RELEASE_TABLET | Freq: Every day | ORAL | Status: DC
  Administered 2022-10-17: 81 mg via ORAL
  Filled 2022-10-16 (×2): qty 1

## 2022-10-16 MED ORDER — INSULIN GLARGINE-YFGN 100 UNIT/ML ~~LOC~~ SOLN
16.0000 [IU] | Freq: Every day | SUBCUTANEOUS | Status: DC
  Administered 2022-10-16: 16 [IU] via SUBCUTANEOUS
  Filled 2022-10-16 (×2): qty 0.16

## 2022-10-16 MED ORDER — ACETAMINOPHEN 325 MG RE SUPP: 650.0000 mg | Freq: Four times a day (QID) | RECTAL | Status: DC | PRN

## 2022-10-16 MED ORDER — INTERFERON BETA-1B 0.3 MG ~~LOC~~ KIT: 0.3000 mg | PACK | SUBCUTANEOUS | Status: DC

## 2022-10-16 MED ORDER — ONDANSETRON HCL 4 MG PO TABS: 4.0000 mg | ORAL_TABLET | Freq: Four times a day (QID) | ORAL | Status: DC | PRN

## 2022-10-16 MED ORDER — ACETAMINOPHEN 325 MG PO TABS
650.0000 mg | ORAL_TABLET | Freq: Four times a day (QID) | ORAL | Status: DC | PRN
  Administered 2022-10-17: 650 mg via ORAL
  Filled 2022-10-16: qty 2

## 2022-10-16 MED ORDER — PANTOPRAZOLE SODIUM 40 MG IV SOLR: 40.0000 mg | Freq: Two times a day (BID) | INTRAVENOUS | Status: DC

## 2022-10-16 MED ORDER — SODIUM BICARBONATE 650 MG PO TABS
650.0000 mg | ORAL_TABLET | Freq: Two times a day (BID) | ORAL | Status: DC
  Administered 2022-10-16: 650 mg via ORAL
  Filled 2022-10-16 (×2): qty 1

## 2022-10-16 MED ORDER — INSULIN ASPART 100 UNIT/ML IJ SOLN
0.0000 [IU] | Freq: Three times a day (TID) | INTRAMUSCULAR | Status: DC
  Administered 2022-10-17: 3 [IU] via SUBCUTANEOUS
  Administered 2022-10-17: 2 [IU] via SUBCUTANEOUS
  Filled 2022-10-16 (×2): qty 1

## 2022-10-16 MED ORDER — ALBUTEROL SULFATE (2.5 MG/3ML) 0.083% IN NEBU: 3.0000 mL | INHALATION_SOLUTION | Freq: Four times a day (QID) | RESPIRATORY_TRACT | Status: DC | PRN

## 2022-10-16 MED ORDER — PANTOPRAZOLE INFUSION (NEW) - SIMPLE MED
8.0000 mg/h | INTRAVENOUS | Status: DC
  Administered 2022-10-16 – 2022-10-17 (×2): 8 mg/h via INTRAVENOUS
  Filled 2022-10-16 (×2): qty 100

## 2022-10-16 MED ORDER — SERTRALINE HCL 50 MG PO TABS
25.0000 mg | ORAL_TABLET | Freq: Every day | ORAL | Status: DC
  Administered 2022-10-17: 25 mg via ORAL
  Filled 2022-10-16: qty 1

## 2022-10-16 NOTE — Assessment & Plan Note (Signed)
-   Continue home supplemental oxygen - Continue home bronchodilators

## 2022-10-16 NOTE — Assessment & Plan Note (Signed)
Renal function currently at baseline.  No further monitoring planned at this time.

## 2022-10-16 NOTE — Progress Notes (Signed)
Patient called regarding his follow-up blood work.  Hemoglobin 5.2.  Recommend patient go to emergency room for blood transfusion especially given the holidays.  Sent a message to Dr. Tasia Catchings.

## 2022-10-16 NOTE — Assessment & Plan Note (Addendum)
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 He follows with hematology with his last IV iron infusion on 07/28/2022.  Iron panel obtained yesterday demonstrated findings consistent with iron deficiency. FOBT is positive, however patient denies any melena or hematochezia.  I suspect this is a slow bleed given patient is asymptomatic.  He is hemodynamically stable at this time.  - 2 units pRBC ordered - Posttransfusion CBC - Repeat CBC in the a.m. - S/p Protonix 40 mg IV in the ED - Restart home Prilosec tomorrow if hemoglobin is stable - Consider consulting GI in the a.m. if any decreases in CBC or if patient experiences hematochezia or melena.  Otherwise, outpatient follow-up - Outpatient follow-up with hematology

## 2022-10-16 NOTE — ED Triage Notes (Signed)
Pt to ED from home via POV. Pt had routine labs done yesterday at PCP and the patient looked at labs this morning and saw his HGB was low. So they called the on call MD and they advised her to bring him to the ER. Pt has had this happen before. Pt denies any active bleeding. Pt feels more weak than normal. Pt denies any N/V. Pt is CAOx4 and in no acute distress.

## 2022-10-16 NOTE — Assessment & Plan Note (Addendum)
Patient states he has good days and bad days.  He denies any recent flares  - Continue home tramadol - Continue home interferon

## 2022-10-16 NOTE — ED Notes (Signed)
Patient complaining of pain. Ordered prn medication given at this time

## 2022-10-16 NOTE — Assessment & Plan Note (Signed)
Patient is currently taking 26 units of long-acting insulin at bedtime.  Given poor p.o. intake since arriving to the ED, will decrease dose tonight.  - Hold home medications - Semglee 16 units

## 2022-10-16 NOTE — ED Provider Triage Note (Signed)
Emergency Medicine Provider Triage Evaluation Note  Jeremy [name], a 71 y.o. male  was evaluated in triage.  Pt complains of low hemoglobin.  Patient with a history of MS, hypertension, HLD, CKD, and anemia of chronic disease, presents to the ED after reviewing his labs for routine office visit yesterday.  He noted that his hemoglobin was at 5.3, which was severely decreased since his last known lab of 8.3.  He had a hospital admission in October for low iron.  Patient denies any hemoptysis, melena, or hematemesis.  Review of Systems  Positive: Low Hgb Negative: GI bleeding, FCS  Physical Exam  BP (!) 140/89   Pulse 80   Temp 98 F (36.7 C) (Oral)   Resp 16   Ht '5\' 9"'$  (1.753 m)   Wt 95.3 kg   SpO2 98%   BMI 31.01 kg/m  Gen:   Awake, no distress  NAD Resp:  Normal effort CTA MSK:   Moves extremities without difficulty  Other:    Medical Decision Making  Medically screening exam initiated at 11:59 AM.  Appropriate orders placed.  Jeremy [name] was informed that the remainder of the evaluation will be completed by another provider, this initial triage assessment does not replace that evaluation, and the importance of remaining in the ED until their evaluation is complete.  Geriatric patient to the ED for concern for low Hgb noted on MyChart from routine visit yesterday.  Denies any significant fatigue, FCS, or GI bleeding.   Melvenia Needles, PA-C 10/16/22 1205

## 2022-10-16 NOTE — ED Provider Notes (Signed)
[facility] Provider Note    Event Date/Time   First MD Initiated Contact with Patient 10/16/22 1659     (approximate)   History   abnormal labs   HPI  Jeremy [name] is a 71 y.o. male   who presents to the emergency department today because of concern for low hemoglobin found on blood work performed yesterday. The patient has been feeling weak over the past week. Has not noticed any bloody or black or tarry stools. The patient does have history of GI bleed in the past, and has required blood products in the past.       Physical Exam   Triage Vital Signs: ED Triage Vitals [10/16/22 1147]  Enc Vitals Group     BP (!) 140/89     Pulse Rate 80     Resp 16     Temp 98 F (36.7 C)     Temp Source Oral     SpO2 98 %     Weight 210 lb (95.3 kg)     Height '5\' 9"'$  (1.753 m)     Head Circumference      Peak Flow      Pain Score 0     Pain Loc      Pain Edu?      Excl. in [place]?     Most recent vital signs: Vitals:   10/16/22 1147 10/16/22 1518  BP: (!) 140/89 (!) 140/60  Pulse: 80 76  Resp: 16 18  Temp: 98 F (36.7 C)   SpO2: 98% 95%   General: Awake, alert, oriented. CV:  Good peripheral perfusion. Regular rate and rhythm. Resp:  Normal effort. Lungs clear. Abd:  No distention.  Other:  GUIAC positive. Brown stool on glove.    ED Results / Procedures / Treatments   Labs (all labs ordered are listed, but only abnormal results are displayed) Labs Reviewed  BASIC METABOLIC PANEL - Abnormal; Notable for the following components:      Result Value   Glucose, Bld 143 (*)    BUN 29 (*)    Creatinine, Ser 1.43 (*)    Calcium 8.5 (*)    GFR, Estimated 53 (*)    All other components within normal limits  CBC WITH DIFFERENTIAL/PLATELET - Abnormal; Notable for the following components:   WBC 3.2 (*)    RBC 2.33 (*)    Hemoglobin 5.2 (*)    HCT 19.9 (*)    MCH 22.3 (*)    MCHC 26.1 (*)    RDW 16.9 (*)    Lymphs Abs 0.5 (*)    All other  components within normal limits  PROTIME-INR - Abnormal; Notable for the following components:   Prothrombin Time 17.1 (*)    INR 1.4 (*)    All other components within normal limits  RETICULOCYTES - Abnormal; Notable for the following components:   Retic Ct Pct 4.7 (*)    RBC. 2.34 (*)    Immature Retic Fract 31.5 (*)    All other components within normal limits  IRON AND TIBC - Abnormal; Notable for the following components:   TIBC 459 (*)    All other components within normal limits  TYPE AND SCREEN     EKG  None   RADIOLOGY None   PROCEDURES:  Critical Care performed: Yes, see critical care procedure note(s)  Procedures  CRITICAL CARE Performed by: Nance Pear   Total critical care time: 30 minutes  Critical care time  was exclusive of separately billable procedures and treating other patients.  Critical care was necessary to treat or prevent imminent or life-threatening deterioration.  Critical care was time spent personally by me on the following activities: development of treatment plan with patient and/or surrogate as well as nursing, discussions with consultants, evaluation of patient's response to treatment, examination of patient, obtaining history from patient or surrogate, ordering and performing treatments and interventions, ordering and review of laboratory studies, ordering and review of radiographic studies, pulse oximetry and re-evaluation of patient's condition.   MEDICATIONS ORDERED IN ED: Medications - No data to display   IMPRESSION / MDM / [place] / ED COURSE  I reviewed the triage vital signs and the nursing notes.                              Differential diagnosis includes, but is not limited to, GI bleed, hematuria, decreased RBC production.  Patient's presentation is most consistent with acute presentation with potential threat to life or bodily function.  The patient is on the cardiac monitor to evaluate for  evidence of arrhythmia and/or significant heart rate changes.  Patient presented to the emergency department today because of concern for anemia. On exam patient was GUIAC positive. Did discuss finding with patient. Discussed and consented for blood transfusion. Will order 2 units at this time. Additionally ordered protonix if this is an upper GI bleed. Discussed with Dr. Charleen Kirks with the hospitalist service who will plan on admission.     FINAL CLINICAL IMPRESSION(S) / ED DIAGNOSES   Final diagnoses:  Gastrointestinal hemorrhage, unspecified gastrointestinal hemorrhage type  Anemia, unspecified type     Note:  This document was prepared using Dragon voice recognition software and may include unintentional dictation errors.    Nance Pear, MD 10/16/22 2036

## 2022-10-16 NOTE — H&P (Signed)
History and Physical    Jeremy [name]: Jeremy [name] WGN:[id number] DOB: [birth year] DOA: 10/16/2022 DOS: the Jeremy [name] was seen and examined on 10/16/2022 PCP: Juline Patch, MD  Jeremy [name] coming from: Home  Chief Complaint:  Chief Complaint  Jeremy [name] presents with   abnormal labs   HPI: Jeremy [name] is a 71 y.o. male with medical history significant of relapsing remitting multiple sclerosis on interferon beta-1B, type 2 diabetes, COPD on 2 L supplemental oxygen, iron deficiency anemia, chronic pain on tramadol, hypertension, primary pulmonary hypertension, CKD stage III, who presents to the ED due to abnormal labs.  Jeremy [name] [name] Jeremy [name] followed with Jeremy [name] neurologist yesterday at which time blood work was drawn.  Jeremy [name] checked Jeremy [name] MyChart today and saw that Jeremy [name] hemoglobin was low at 5.2 and due to this came to the ED.  Jeremy [name] endorses chronic shortness of breath and weakness in the setting of COPD and MS, but [name] that Jeremy [name] symptoms are at baseline.  Jeremy [name] checks Jeremy [name] stools with each movement and denies any melena or hematochezia.  Jeremy [name] denies any recent nausea or vomiting.  ED course: On arrival to the ED, Jeremy [name] was normotensive at 140/89 with heart rate of 80.  Jeremy [name] was saturating at 98% on home 2 L.  Initial workup remarkable for WBC of 3.2, hemoglobin of 5.2, MCV of 85, with creatinine of 1.43.  FOBT positive.  2 units of packed RBCs were ordered.  TRH contacted for admission.  Review of Systems: As mentioned in the history of present illness. All other systems reviewed and are negative.  Past Medical History:  Diagnosis Date   Chronic pain    Depression    Diabetes ([place])    GERD (gastroesophageal reflux disease)    Hyperlipemia    Hypertension    MS (multiple sclerosis) ([place])    Past Surgical History:  Procedure Laterality Date   COLECTOMY  06-2008   COLONOSCOPY  2015   normal   COLONOSCOPY WITH PROPOFOL N/A 09/10/2021   Procedure: COLONOSCOPY WITH PROPOFOL;  Surgeon: [name], MD;   Location: [facility] ENDOSCOPY;  Service: Endoscopy;  Laterality: N/A;   ESOPHAGOGASTRODUODENOSCOPY (EGD) WITH PROPOFOL N/A 09/10/2021   Procedure: ESOPHAGOGASTRODUODENOSCOPY (EGD) WITH PROPOFOL;  Surgeon: [name], MD;  Location: [facility] ENDOSCOPY;  Service: Endoscopy;  Laterality: N/A;   GIVENS CAPSULE STUDY N/A 09/25/2021   Procedure: GIVENS CAPSULE STUDY;  Surgeon: Jonathon Bellows, MD;  Location: [facility] ENDOSCOPY;  Service: Gastroenterology;  Laterality: N/A;   Social History:  reports that Jeremy [name] quit smoking about 16 years ago. Jeremy [name] smoking use included cigarettes. Jeremy [name] has a 45.00 pack-year smoking history. Jeremy [name] has never used smokeless tobacco. Jeremy [name] reports that Jeremy [name] does not currently use alcohol. Jeremy [name] reports that Jeremy [name] does not use drugs.  Allergies  Allergen Reactions   Betadine [Povidone Iodine]     Family History  Problem Relation Age of Onset   Lung cancer Mother    Heart attack Father    Heart attack Brother    COPD Brother    Diabetes Brother    Esophageal cancer Nephew     Prior to Admission medications   Medication Sig Start Date End Date Taking? Authorizing Provider  albuterol (VENTOLIN HFA) 108 (90 Base) MCG/ACT inhaler Inhale 2 puffs into the lungs in the morning, at noon, in the evening, and at bedtime.    [provider]  aspirin 81 MG tablet Take 81 mg by mouth daily.    [provider]  B-D INSULIN SYRINGE 1CC/25GX1" 25G X 1"  1 ML MISC USE AS DIRECTED 06/22/21   Juline Patch, MD  ferrous sulfate 325 (65 FE) MG tablet Take 325 mg by mouth daily with breakfast.    [provider]  gemfibrozil (LOPID) 600 MG tablet TAKE 1 TABLET BY MOUTH TWICE  DAILY BEFORE MEALS 07/02/22   Juline Patch, MD  insulin glargine (LANTUS) 100 UNIT/ML Solostar Pen Inject 20 Units into the skin at bedtime. 08/19/22   Enzo Bi, MD  Interferon Beta-1b (BETASERON) 0.3 MG KIT injection Inject subcutaneously 1 syringe every other day. 08/25/22   Marcial Pacas, MD  loratadine (CLARITIN) 10 MG  tablet Take 1 tablet (10 mg total) by mouth daily. 07/02/22   Juline Patch, MD  metFORMIN (GLUCOPHAGE) 500 MG tablet Take 500 mg by mouth 2 (two) times daily.    [provider]  mupirocin ointment (BACTROBAN) 2 % Apply 1 Application topically 2 (two) times daily. Apply to effected area bid 07/02/22   Juline Patch, MD  omeprazole (PRILOSEC) 40 MG capsule TAKE 1 CAPSULE BY MOUTH DAILY 07/02/22   Juline Patch, MD  pregabalin (LYRICA) 200 MG capsule Take 1 capsule (200 mg total) by mouth 2 (two) times daily. 07/02/22   Juline Patch, MD  sertraline (ZOLOFT) 50 MG tablet Take 1/2 tablet daily 07/02/22   Juline Patch, MD  simvastatin (ZOCOR) 40 MG tablet Take 1 tablet (40 mg total) by mouth daily. 07/02/22   Juline Patch, MD  sodium bicarbonate 650 MG tablet Take 650 mg by mouth 2 (two) times daily. 06/10/21   [provider]  traMADol Veatrice Bourbon) 50 MG tablet Take 1 tablet every 6 hours as needed 10/05/22   Suzzanne Cloud, NP    Physical Exam: Vitals:   10/16/22 1659 10/16/22 1812 10/16/22 1815 10/16/22 1832  BP: (!) 165/58 (!) 162/66 (!) 162/66 (!) 161/76  Pulse: 78 85 85 87  Resp: _0 Temp: 97.9 F (36.6 C) 97.9 F (36.6 C) 97.9 F (36.6 C) 97.8 F (36.6 C)  TempSrc: Oral Oral Oral Oral  SpO2: 98% 95% 95% 93%  Weight:      Height:       Physical Exam Vitals and nursing note reviewed.  Constitutional:      Appearance: Jeremy [name] is overweight.  HENT:     Head: Normocephalic and atraumatic.     Mouth/Throat:     Mouth: Mucous membranes are moist.     Pharynx: Oropharynx is clear.  Eyes:     Conjunctiva/sclera: Conjunctivae normal.     Pupils: Pupils are equal, round, and reactive to light.  Cardiovascular:     Rate and Rhythm: Normal rate and regular rhythm.     Heart sounds: No murmur heard.    No gallop.  Pulmonary:     Effort: Pulmonary effort is normal. No respiratory distress.     Breath sounds: Normal breath sounds. No wheezing, rhonchi or rales.   Abdominal:     General: Bowel sounds are normal. There is no distension.     Palpations: Abdomen is soft.     Tenderness: There is no abdominal tenderness. There is no guarding.  Musculoskeletal:     Right lower leg: 1+ Pitting Edema present.     Left lower leg: 1+ Pitting Edema present.  Skin:    General: Skin is warm and dry.     Coloration: Skin is pale.  Neurological:     General: No focal deficit present.  Mental Status: Jeremy [name] is alert and oriented to person, place, and time. Mental status is at baseline.  Psychiatric:        Mood and Affect: Mood normal.        Behavior: Behavior normal.        Thought Content: Thought content normal.        Judgment: Judgment normal.    Data Reviewed: CBC with WBC of 3.2, hemoglobin of 5.2 and platelets of 152.   BMP with potassium of 4.4, glucose of 143, BUN of 29, creatinine of 1.43 with GFR 53. Iron panel obtained today with iron saturation of 28 and TIBC of 459.  Iron panel obtained yesterday with saturation of 5%, TIBC of 447 and ferritin of 9. INR elevated at 1.4.  There are no new results to review at this time.  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 Jeremy [name] follows with hematology with Jeremy [name] last IV iron infusion on 07/28/2022.  Iron panel obtained yesterday demonstrated findings consistent with iron deficiency. FOBT is positive, however Jeremy [name] denies any melena or hematochezia.  I suspect this is a slow bleed given Jeremy [name] is asymptomatic.  Jeremy [name] is hemodynamically stable at this time.  - 2 units pRBC ordered - Posttransfusion CBC - Repeat CBC in the a.m. - S/p Protonix 40 mg IV in the ED - Restart home Prilosec tomorrow if hemoglobin is stable - Consider consulting GI in the a.m. if any decreases in CBC or if Jeremy [name] experiences hematochezia or melena.  Otherwise, outpatient follow-up - Outpatient follow-up with hematology  Type 2  diabetes mellitus with diabetic polyneuropathy, with long-term current use of insulin ([place]) Jeremy [name] is currently taking 26 units of long-acting insulin at bedtime.  Given poor p.o. intake since arriving to the ED, will decrease dose tonight.  - Hold home medications - Semglee 16 units  Stage 3a chronic kidney disease ([place]) Renal function currently at baseline.  No further monitoring planned at this time.  COPD (chronic obstructive pulmonary disease) ([place]) - Continue home supplemental oxygen - Continue home bronchodilators  Multiple sclerosis ([place]) Jeremy [name] Jeremy [name] has good days and bad days.  Jeremy [name] denies any recent flares  - Continue home tramadol - Continue home interferon  Advance Care Planning:   Code Status: Full Code.  Verified with Jeremy [name] and Jeremy [name] at bedside.  Consults: None  Family Communication: Jeremy [name] updated at bedside.  Severity of Illness: The appropriate Jeremy [name] status for this Jeremy [name] is OBSERVATION. Observation status is judged to be reasonable and necessary in order to provide the required intensity of service to ensure the Jeremy safety. The Jeremy presenting symptoms, physical exam findings, and initial radiographic and laboratory data in the context of their medical condition is felt to place them at decreased risk for further clinical deterioration. Furthermore, it is anticipated that the Jeremy [name] will be medically stable for discharge from the hospital within 2 midnights of admission.   Author: Jose Persia, MD 10/16/2022 6:53 PM  For on call review [email or web address].

## 2022-10-16 NOTE — ED Notes (Signed)
Pt O2 titrated to 2L at this time. O2 100%

## 2022-10-17 DIAGNOSIS — D500 Iron deficiency anemia secondary to blood loss (chronic): Secondary | ICD-10-CM | POA: Diagnosis not present

## 2022-10-17 LAB — HIV ANTIBODY (ROUTINE TESTING W REFLEX): HIV Screen 4th Generation wRfx: NONREACTIVE

## 2022-10-17 LAB — BASIC METABOLIC PANEL
Anion gap: 9 (ref 5–15)
BUN: 27 mg/dL — ABNORMAL HIGH (ref 8–23)
CO2: 23 mmol/L (ref 22–32)
Calcium: 8.3 mg/dL — ABNORMAL LOW (ref 8.9–10.3)
Chloride: 110 mmol/L (ref 98–111)
Creatinine, Ser: 1.17 mg/dL (ref 0.61–1.24)
GFR, Estimated: >60 mL/min (ref >60)
Glucose, Bld: 93 mg/dL (ref 70–99)
Potassium: 4 mmol/L (ref 3.5–5.1)
Sodium: 142 mmol/L (ref 135–145)

## 2022-10-17 LAB — CBC
HCT: 24.1 % — ABNORMAL LOW (ref 39.0–52.0)
Hemoglobin: 7.1 g/dL — ABNORMAL LOW (ref 13.0–17.0)
MCH: 25 pg — ABNORMAL LOW (ref 26.0–34.0)
MCHC: 29.5 g/dL — ABNORMAL LOW (ref 30.0–36.0)
MCV: 84.9 fL (ref 80.0–100.0)
Platelets: 147 10*3/uL — ABNORMAL LOW (ref 150–400)
RBC: 2.84 MIL/uL — ABNORMAL LOW (ref 4.22–5.81)
RDW: 15.7 % — ABNORMAL HIGH (ref 11.5–15.5)
WBC: 4.6 10*3/uL (ref 4.0–10.5)
nRBC: 0 % (ref 0.0–0.2)

## 2022-10-17 LAB — CBG MONITORING, ED
Glucose-Capillary: 155 mg/dL — ABNORMAL HIGH (ref 70–99)
Glucose-Capillary: 147 mg/dL — ABNORMAL HIGH (ref 70–99)

## 2022-10-17 LAB — PREPARE RBC (CROSSMATCH)

## 2022-10-17 MED ORDER — SODIUM CHLORIDE 0.9% IV SOLUTION: Freq: Once | INTRAVENOUS | Status: AC

## 2022-10-17 NOTE — Discharge Summary (Signed)
Physician Discharge Summary   Jeremy [name]  male DOB: April 18, 1951  MVE:[id number]  PCP: Juline Patch, MD  Admit date: 10/16/2022 Discharge date: 10/17/2022  Admitted From: home Disposition:  home Wife updated at bedside prior to discharge CODE STATUS: Full code  Discharge Instructions     Discharge instructions   Complete by: As directed    You have received 3 units of blood transfusion for your suspected slow GI bleed.  Per Dr. Allen Norris, you will need to see GI in [facility] or [facility] for consideration of double-balloon enteroscopy.  Please call Dr. Dorothey Baseman office, and they will make the referral to [facility] or [facility].  Please follow up with Dr. Tasia Catchings for regular IV iron infusion and blood checks.   Dr. Enzo Bi [facility] Course:  For full details, please see H&P, progress notes, consult notes and ancillary notes.  Briefly,  Jeremy [name] is a 71 y.o. male with medical history significant of relapsing remitting multiple sclerosis on interferon beta-1B, type 2 diabetes, COPD on 2 L supplemental oxygen, iron deficiency anemia, chronic pain on tramadol, hypertension, primary pulmonary hypertension, CKD stage III, who presented to the ED due to abnormal labs with Hgb 5.2.   He endorsed chronic shortness of breath and weakness in the setting of COPD and MS, but stated that his symptoms are at baseline.  He checks his stools with each movement and denies any melena or hematochezia.   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 He follows with hematology with his last IV iron infusion on 07/28/2022.   --pt received 2u pRBC with Hgb increase to 7.1, and received a 3rd unit pRBC before discharge. --patient had an EGD and colonoscopy in November 2022 which did not show any source of the anemia. Pt had a capsule endoscopy that was read as a small bowel polyp and AVMs without any sign of bleeding.  Per last  clinic note with GI Dr. Allen Norris on 10/05/21, If anemia continues pt should be referred to tertiary care center to get a double-balloon enteroscopy.  --Pt was instructed to call Dr. Dorothey Baseman office to obtain a referral to [facility] or [facility] for consideration of double-balloon enteroscopy.  --continue outpatient follow up with Dr. Tasia Catchings for regular IV iron infusion and blood checks.  --cont PPI   Type 2 diabetes mellitus with diabetic polyneuropathy, with long-term current use of insulin ([place]) --discharged back on home regimen as below.   Stage 3a chronic kidney disease ([place]) Renal function currently at baseline.     COPD (chronic obstructive pulmonary disease) (HCC) Chronic hypoxemic respiratory failure on 2L at baseline - stable   Multiple sclerosis [facility]) Patient states he has good days and bad days.  He denies any recent flares - Continue home tramadol - Continue home interferon   Discharge Diagnoses:  Principal Problem:   Anemia due to blood loss Active Problems:   Type 2 diabetes mellitus with diabetic polyneuropathy, with long-term current use of insulin (HCC)   Stage 3a chronic kidney disease (H[facility])   COPD (chronic obstructive pulmonary disease) ([place])   Multiple sclerosis ([place])   30 Day Unplanned Readmission Risk Score    Flowsheet Row ED to Hosp-Admission (Discharged) from 08/18/2022 in [place]  30 Day Unplanned Readmission Risk Score (%) 23.46 Filed at 08/19/2022 0801       This score  is the patient's risk of an unplanned readmission within 30 days of being discharged (0 -100%). The score is based on dignosis, age, lab data, medications, orders, and past utilization.   Low:  0-14.9   Medium: 15-21.9   High: 22-29.9   Extreme: 30 and above         Discharge Instructions:  Allergies as of 10/17/2022       Reactions   Betadine [povidone Iodine]         Medication List     STOP taking these medications    sodium bicarbonate  650 MG tablet       TAKE these medications    albuterol 108 (90 Base) MCG/ACT inhaler Commonly known as: VENTOLIN HFA Inhale 2 puffs into the lungs in the morning, at noon, in the evening, and at bedtime.   aspirin 81 MG tablet Take 81 mg by mouth daily.   B-D INSULIN SYRINGE 1CC/25GX1" 25G X 1" 1 ML Misc Generic drug: Insulin Syringe-Needle U-100 USE AS DIRECTED   Betaseron 0.3 MG Kit injection Generic drug: Interferon Beta-1b Inject subcutaneously 1 syringe every other day.   cyanocobalamin 500 MCG tablet Commonly known as: VITAMIN B12 Take 1,000 mcg by mouth daily.   ferrous sulfate 325 (65 FE) MG tablet Take 325 mg by mouth daily with breakfast.   gemfibrozil 600 MG tablet Commonly known as: LOPID TAKE 1 TABLET BY MOUTH TWICE  DAILY BEFORE MEALS What changed:  how much to take how to take this when to take this   insulin glargine 100 UNIT/ML Solostar Pen Commonly known as: LANTUS Inject 20 Units into the skin at bedtime. What changed: how much to take   loratadine 10 MG tablet Commonly known as: CLARITIN Take 1 tablet (10 mg total) by mouth daily.   metFORMIN 500 MG tablet Commonly known as: GLUCOPHAGE Take 500 mg by mouth 2 (two) times daily.   mupirocin ointment 2 % Commonly known as: BACTROBAN Apply 1 Application topically 2 (two) times daily. Apply to effected area bid   omeprazole 40 MG capsule Commonly known as: PRILOSEC TAKE 1 CAPSULE BY MOUTH DAILY What changed:  how much to take how to take this when to take this   pregabalin 200 MG capsule Commonly known as: LYRICA Take 1 capsule (200 mg total) by mouth 2 (two) times daily.   sertraline 50 MG tablet Commonly known as: ZOLOFT Take 1/2 tablet daily What changed:  how much to take how to take this when to take this   simvastatin 40 MG tablet Commonly known as: ZOCOR Take 1 tablet (40 mg total) by mouth daily.   traMADol 50 MG tablet Commonly known as: ULTRAM Take 1 tablet  every 6 hours as needed         Follow-up Information     [name], MD. Call.   Specialty: Gastroenterology Why: Call ofice to request referral to [facility] or [facility] for double-balloon enteroscopy. Contact information: [address] [place]  [place] [address] [phone]         Earlie Server, MD Follow up in 1 week(s).   Specialty: Oncology Contact information: [place] [address] [phone]                 Allergies  Allergen Reactions   Betadine [Povidone Iodine]      The results of significant diagnostics from this hospitalization (including imaging, microbiology, ancillary and laboratory) are listed below for reference.   Consultations:   Procedures/Studies: No  results found.    Labs: BNP (last 3 results) No results for input(s): "BNP" in the last 8760 hours. Basic Metabolic Panel: Recent Labs  Lab 10/16/22 1150 10/17/22 0510  NA 140 142  K 4.4 4.0  CL 108 110  CO2 23 23  GLUCOSE 143* 93  BUN 29* 27*  CREATININE 1.43* 1.17  CALCIUM 8.5* 8.3*   Liver Function Tests: No results for input(s): "AST", "ALT", "ALKPHOS", "BILITOT", "PROT", "ALBUMIN" in the last 168 hours. No results for input(s): "LIPASE", "AMYLASE" in the last 168 hours. No results for input(s): "AMMONIA" in the last 168 hours. CBC: Recent Labs  Lab 10/15/22 1132 10/16/22 1150 10/17/22 0510  WBC 4.5 3.2* 4.6  NEUTROABS 2.8 2.1  --   HGB 5.2* 5.2* 7.1*  HCT 19.5* 19.9* 24.1*  MCV 84.1 85.4 84.9  PLT 156 152 147*   Cardiac Enzymes: No results for input(s): "CKTOTAL", "CKMB", "CKMBINDEX", "TROPONINI" in the last 168 hours. BNP: Invalid input(s): "POCBNP" CBG: Recent Labs  Lab 10/17/22 0838 10/17/22 1215  GLUCAP 155* 147*   D-Dimer No results for input(s): "DDIMER" in the last 72 hours. Hgb A1c No results for input(s): "HGBA1C" in the last 72 hours. Lipid Profile No results for input(s): "CHOL", "HDL", "LDLCALC", "TRIG", "CHOLHDL",  "LDLDIRECT" in the last 72 hours. Thyroid function studies No results for input(s): "TSH", "T4TOTAL", "T3FREE", "THYROIDAB" in the last 72 hours.  Invalid input(s): "FREET3" Anemia work up Recent Labs    10/15/22 1132 10/16/22 1150  FERRITIN 9*  --   TIBC 447 459*  IRON 22* 127  RETICCTPCT 4.3* 4.7*   Urinalysis    Component Value Date/Time   COLORURINE YELLOW (A) 08/18/2022 1237   APPEARANCEUR CLEAR (A) 08/18/2022 1237   APPEARANCEUR [place] 08/27/2013 1157   LABSPEC 1.017 08/18/2022 1237   LABSPEC 1.013 08/27/2013 1157   PHURINE 5.0 08/18/2022 1237   GLUCOSEU NEGATIVE 08/18/2022 1237   GLUCOSEU Negative 08/27/2013 1157   HGBUR MODERATE (A) 08/18/2022 [place] 08/18/2022 1237   BILIRUBINUR Negative 08/27/2013 [place] 08/18/2022 [place] 08/18/2022 1237   NITRITE NEGATIVE 08/18/2022 [place] 08/18/2022 1237   LEUKOCYTESUR Negative 08/27/2013 1157   Sepsis Labs Recent Labs  Lab 10/15/22 1132 10/16/22 1150 10/17/22 0510  WBC 4.5 3.2* 4.6   Microbiology No results found for this or any previous visit (from the past 240 hour(s)).   Total time spend on discharging this patient, including the last patient exam, discussing the hospital stay, instructions for ongoing care as it relates to all pertinent caregivers, as well as preparing the medical discharge records, prescriptions, and/or referrals as applicable, is 45 minutes.    Enzo Bi, MD  [facility] 10/17/2022, 1:13 PM

## 2022-10-18 LAB — BPAM RBC
Blood Product Expiration Date: 202401172359
Blood Product Expiration Date: 202401172359
Blood Product Expiration Date: 202401172359
ISSUE DATE / TIME: 202312231805
ISSUE DATE / TIME: 202312232058
ISSUE DATE / TIME: 202312241204
Unit Type and Rh: 6200
Unit Type and Rh: 6200
Unit Type and Rh: 6200

## 2022-10-18 LAB — TYPE AND SCREEN
ABO/RH(D): A POS
Antibody Screen: NEGATIVE
Unit division: 0
Unit division: 0
Unit division: 0

## 2022-10-19 ENCOUNTER — Other Ambulatory Visit: Payer: Self-pay

## 2022-10-19 ENCOUNTER — Telehealth: Payer: Self-pay

## 2022-10-19 DIAGNOSIS — D631 Anemia in chronic kidney disease: Secondary | ICD-10-CM

## 2022-10-19 NOTE — Telephone Encounter (Signed)
Pt lmovm requesting a call back... Pt was seen in ED and they advised them theat you will need to send a referral  Call [name], MD (Gastroenterology); Call ofice to request referral to [facility] or [facility] for double-balloon enteroscopy.   Please advise if pt needs appt

## 2022-10-21 ENCOUNTER — Encounter: Payer: Self-pay | Admitting: Oncology

## 2022-10-21 ENCOUNTER — Inpatient Hospital Stay: Payer: Medicare Other

## 2022-10-21 ENCOUNTER — Inpatient Hospital Stay (HOSPITAL_BASED_OUTPATIENT_CLINIC_OR_DEPARTMENT_OTHER): Payer: Medicare Other | Admitting: Oncology

## 2022-10-21 VITALS — BP 156/57 | HR 82 | Temp 98.6°F | Wt 221.2 lb

## 2022-10-21 VITALS — BP 154/48 | HR 72

## 2022-10-21 DIAGNOSIS — Q2730 Arteriovenous malformation, site unspecified: Secondary | ICD-10-CM

## 2022-10-21 DIAGNOSIS — D500 Iron deficiency anemia secondary to blood loss (chronic): Secondary | ICD-10-CM | POA: Diagnosis not present

## 2022-10-21 DIAGNOSIS — D631 Anemia in chronic kidney disease: Secondary | ICD-10-CM | POA: Diagnosis not present

## 2022-10-21 DIAGNOSIS — N1831 Chronic kidney disease, stage 3a: Secondary | ICD-10-CM | POA: Diagnosis not present

## 2022-10-21 LAB — IRON AND TIBC
Iron: 67 ug/dL (ref 45–182)
Saturation Ratios: 16 % — ABNORMAL LOW (ref 17.9–39.5)
TIBC: 424 ug/dL (ref 250–450)
UIBC: 357 ug/dL

## 2022-10-21 LAB — CBC WITH DIFFERENTIAL/PLATELET
Abs Immature Granulocytes: 0.01 10*3/uL (ref 0.00–0.07)
Basophils Absolute: 0.1 10*3/uL (ref 0.0–0.1)
Basophils Relative: 1 %
Eosinophils Absolute: 0.3 10*3/uL (ref 0.0–0.5)
Eosinophils Relative: 6 %
HCT: 29.9 % — ABNORMAL LOW (ref 39.0–52.0)
Hemoglobin: 8.7 g/dL — ABNORMAL LOW (ref 13.0–17.0)
Immature Granulocytes: 0 %
Lymphocytes Relative: 17 %
Lymphs Abs: 0.8 10*3/uL (ref 0.7–4.0)
MCH: 24.9 pg — ABNORMAL LOW (ref 26.0–34.0)
MCHC: 29.1 g/dL — ABNORMAL LOW (ref 30.0–36.0)
MCV: 85.4 fL (ref 80.0–100.0)
Monocytes Absolute: 0.5 10*3/uL (ref 0.1–1.0)
Monocytes Relative: 10 %
Neutro Abs: 3 10*3/uL (ref 1.7–7.7)
Neutrophils Relative %: 66 %
Platelets: 153 10*3/uL (ref 150–400)
RBC: 3.5 MIL/uL — ABNORMAL LOW (ref 4.22–5.81)
RDW: 17.4 % — ABNORMAL HIGH (ref 11.5–15.5)
WBC: 4.5 10*3/uL (ref 4.0–10.5)
nRBC: 0 % (ref 0.0–0.2)

## 2022-10-21 LAB — RETIC PANEL
Immature Retic Fract: 22.2 % — ABNORMAL HIGH (ref 2.3–15.9)
RBC.: 3.4 MIL/uL — ABNORMAL LOW (ref 4.22–5.81)
Retic Count, Absolute: 126 10*3/uL (ref 19.0–186.0)
Retic Ct Pct: 3.7 % — ABNORMAL HIGH (ref 0.4–3.1)
Reticulocyte Hemoglobin: 23.6 pg — ABNORMAL LOW (ref >27.9)

## 2022-10-21 LAB — SAMPLE TO BLOOD BANK

## 2022-10-21 LAB — FERRITIN: Ferritin: 17 ng/mL — ABNORMAL LOW (ref 24–336)

## 2022-10-21 MED ORDER — SODIUM CHLORIDE 0.9 % IV SOLN
200.0000 mg | Freq: Once | INTRAVENOUS | Status: AC
  Administered 2022-10-21: 200 mg via INTRAVENOUS
  Filled 2022-10-21: qty 200

## 2022-10-21 NOTE — Assessment & Plan Note (Signed)
Patient has been referred to establish care with [facility] for further management.

## 2022-10-21 NOTE — Progress Notes (Addendum)
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 Labs reviewed and discussed with patient. Consistent with persistent iron deficiency anemia. Recommend IV Venofer 200 mg x 3 doses Repeat blood work in 2 weeks, then Venofer Q2 weeks x 3.   Anemia in stage 3a chronic kidney disease (HCC) IV Venofer treatment to improve iron store   Arteriovenous malformation (AVM) Patient has been referred to establish care with [facility] for further management.   Orders Placed This Encounter  Procedures   CBC with Differential/Platelet    Standing Status:   Future    Standing Expiration Date:   10/22/2023   Ferritin    Standing Status:   Future    Standing Expiration Date:   10/22/2023   Iron and TIBC    Standing Status:   Future    Standing Expiration Date:   10/22/2023   CBC with Differential/Platelet    Standing Status:   Future    Standing Expiration Date:   10/22/2023   Retic Panel    Standing Status:   Future    Standing Expiration Date:   10/22/2023   Follow-up in 2 months All questions were answered. The patient knows to call the clinic with any problems, questions or concerns.  Jeremy Server, MD, PhD [facility] Hematology Oncology 10/21/2022     PERTINENT HEMATOLOGY HISTORY  Jeremy [name] is a 71 y.o. male who has above history reviewed by me today presents for follow up visit for anemia 09/24/2021,-09/27/2021, patient had hemoglobin dropped to 5.3 and was advised to go to emergency room be admitted.  Patient received 2 units of PRBC transfusion IV Venofer.    Endoscopy showed gastritis.  Small capsule study showed small bowel polyp.  His hemoglobin improved to 8.2 and patient was discharged.  #12/04/2021 Small  Bowel Capsule Study Showed Nonbleeding AVM, esophagitis, gastritis, poor prep.  Small bowel polyp, area of erythema in colon obscured by poor prep.   INTERVAL HISTORY Jeremy [name] is a 71 y.o. male who has above history reviewed by me today presents for follow up visit for management of iron deficiency anemia. During the interval, patient had repeat blood work done which showed hemoglobin of 5.2, patient was advised to go to emergency room for further evaluation. Admitted from 10/16/22- 10/17/22. He received PRBC transfusions, Hb improved at discharged to 7.1 Today patient present for follow-up.  He reports fatigue has improved since discharge.  Denies any red blood in the stool.  Denies any abdominal pain.  Review of Systems  Constitutional:  Positive for fatigue. Negative for appetite change, chills and fever.  HENT:   Negative for hearing loss and voice change.   Eyes:  Negative for eye problems and icterus.  Respiratory:  Positive for shortness of breath. Negative for chest tightness and cough.   Cardiovascular:  Negative for chest pain and leg swelling.  Gastrointestinal:  Negative for abdominal distention and abdominal pain.  Endocrine: Negative for hot flashes.  Genitourinary:  Negative for difficulty urinating, dysuria and frequency.   Musculoskeletal:  Negative for arthralgias.  Skin:  Negative for itching and rash.  Neurological:  Negative for light-headedness and numbness.  Hematological:  Negative for adenopathy. Does not bruise/bleed easily.  Psychiatric/Behavioral:  Negative for confusion.  MEDICAL HISTORY:  Past Medical History:  Diagnosis Date   Chronic pain    Depression    Diabetes (HCC)    GERD (gastroesophageal reflux disease)    Hyperlipemia    Hypertension    MS (multiple sclerosis) ([place])     SURGICAL HISTORY: Past Surgical History:  Procedure Laterality Date   COLECTOMY  06-2008   COLONOSCOPY  2015   normal   COLONOSCOPY WITH PROPOFOL N/A 09/10/2021    Procedure: COLONOSCOPY WITH PROPOFOL;  Surgeon: [name], MD;  Location: [facility] ENDOSCOPY;  Service: Endoscopy;  Laterality: N/A;   ESOPHAGOGASTRODUODENOSCOPY (EGD) WITH PROPOFOL N/A 09/10/2021   Procedure: ESOPHAGOGASTRODUODENOSCOPY (EGD) WITH PROPOFOL;  Surgeon: [name], MD;  Location: [facility] ENDOSCOPY;  Service: Endoscopy;  Laterality: N/A;   GIVENS CAPSULE STUDY N/A 09/25/2021   Procedure: GIVENS CAPSULE STUDY;  Surgeon: Jonathon Bellows, MD;  Location: [facility] ENDOSCOPY;  Service: Gastroenterology;  Laterality: N/A;    SOCIAL HISTORY: Social History   Socioeconomic History   Marital status: Married    Spouse name: Jeremy [name]   Number of children: 2   Years of education: GED   Highest education level: Not on file  Occupational History    Comment: Disabled  Tobacco Use   Smoking status: Former    Packs/day: 1.50    Years: 30.00    Total pack years: 45.00    Types: Cigarettes    Quit date: 01/23/2006    Years since quitting: 16.7   Smokeless tobacco: Never   Tobacco comments:    N/A  Vaping Use   Vaping Use: Never used  Substance and Sexual Activity   Alcohol use: Not Currently    Comment: rare; maybe 2 beers a year   Drug use: No   Sexual activity: Not Currently  Other Topics Concern   Not on file  Social History Narrative   Patient is disabled.    Patient lives with his wife Jeremy [name].    Patient has 2 children.       Social Determinants of Health   Financial Resource Strain: Low Risk  (11/16/2021)   Overall Financial Resource Strain (CARDIA)    Difficulty of Paying Living Expenses: Not hard at all  Food Insecurity: No Food Insecurity (08/18/2022)   Hunger Vital Sign    Worried About Running Out of Food in the Last Year: Never true    Ran Out of Food in the Last Year: Never true  Transportation Needs: No Transportation Needs (08/18/2022)   PRAPARE - Hydrologist (Medical): No    Lack of Transportation (Non-Medical): No  Physical  Activity: Sufficiently Active (11/16/2021)   Exercise Vital Sign    Days of Exercise per Week: 7 days    Minutes of Exercise per Session: 30 min  Stress: No Stress Concern Present (11/16/2021)   [place]    Feeling of Stress : Not at all  Social Connections: Moderately Isolated (11/16/2021)   Social Connection and Isolation Panel [NHANES]    Frequency of Communication with Friends and Family: More than three times a week    Frequency of Social Gatherings with Friends and Family: Once a week    Attends Religious Services: Never    Marine scientist or Organizations: No    Attends Archivist Meetings: Never    Marital Status: Married  Human resources officer Violence: Not At Risk (08/18/2022)   Humiliation, Afraid, Rape, and Kick questionnaire  Fear of Current or Ex-Partner: No    Emotionally Abused: No    Physically Abused: No    Sexually Abused: No    FAMILY HISTORY: Family History  Problem Relation Age of Onset   Lung cancer Mother    Heart attack Father    Heart attack Brother    COPD Brother    Diabetes Brother    Esophageal cancer Nephew     ALLERGIES:  is allergic to betadine [povidone iodine].  MEDICATIONS:  Current Outpatient Medications  Medication Sig Dispense Refill   albuterol (VENTOLIN HFA) 108 (90 Base) MCG/ACT inhaler Inhale 2 puffs into the lungs in the morning, at noon, in the evening, and at bedtime.     aspirin 81 MG tablet Take 81 mg by mouth daily.     B-D INSULIN SYRINGE 1CC/25GX1" 25G X 1" 1 ML MISC USE AS DIRECTED 10 each 2   cyanocobalamin (VITAMIN B12) 500 MCG tablet Take 1,000 mcg by mouth daily.     ferrous sulfate 325 (65 FE) MG tablet Take 325 mg by mouth daily with breakfast.     gemfibrozil (LOPID) 600 MG tablet TAKE 1 TABLET BY MOUTH TWICE  DAILY BEFORE MEALS (Patient taking differently: Take 600 mg by mouth 2 (two) times daily before a meal. TAKE 1 TABLET BY MOUTH  TWICE  DAILY BEFORE MEALS) 180 tablet 1   insulin glargine (LANTUS) 100 UNIT/ML Solostar Pen Inject 20 Units into the skin at bedtime. (Patient taking differently: Inject 26 Units into the skin at bedtime.) 15 mL 11   Interferon Beta-1b (BETASERON) 0.3 MG KIT injection Inject subcutaneously 1 syringe every other day. 45 kit 4   loratadine (CLARITIN) 10 MG tablet Take 1 tablet (10 mg total) by mouth daily. 90 tablet 1   metFORMIN (GLUCOPHAGE) 500 MG tablet Take 500 mg by mouth 2 (two) times daily.     mupirocin ointment (BACTROBAN) 2 % Apply 1 Application topically 2 (two) times daily. Apply to effected area bid 30 g 1   omeprazole (PRILOSEC) 40 MG capsule TAKE 1 CAPSULE BY MOUTH DAILY (Patient taking differently: Take 40 mg by mouth daily. TAKE 1 CAPSULE BY MOUTH DAILY) 90 capsule 1   pregabalin (LYRICA) 200 MG capsule Take 1 capsule (200 mg total) by mouth 2 (two) times daily. 60 capsule 3   sertraline (ZOLOFT) 50 MG tablet Take 1/2 tablet daily (Patient taking differently: Take 25 mg by mouth daily. Take 1/2 tablet daily) 45 tablet 1   simvastatin (ZOCOR) 40 MG tablet Take 1 tablet (40 mg total) by mouth daily. 90 tablet 1   traMADol (ULTRAM) 50 MG tablet Take 1 tablet every 6 hours as needed 90 tablet 0   No current facility-administered medications for this visit.     PHYSICAL EXAMINATION: ECOG PERFORMANCE STATUS: 2 - Symptomatic, <50% confined to bed Vitals:   10/21/22 1345  BP: (!) 156/57  [name]: 82  Temp: 98.6 F (37 C)  SpO2: 90%   Filed Weights   10/21/22 1345  Weight: 221 lb 3.2 oz (100.3 kg)    Physical Exam Constitutional:      General: He is not in acute distress. HENT:     Head: Normocephalic and atraumatic.  Eyes:     General: No scleral icterus. Cardiovascular:     Rate and Rhythm: Normal rate and regular rhythm.     Heart sounds: Normal heart sounds.  Pulmonary:     Effort: Pulmonary effort is normal. No respiratory distress.  Breath sounds: No wheezing.      Comments: Decreased breath sound bilaterally.   Abdominal:     General: Bowel sounds are normal. There is no distension.     Palpations: Abdomen is soft.  Musculoskeletal:        General: No deformity. Normal range of motion.     Cervical back: Normal range of motion and neck supple.  Skin:    General: Skin is warm and dry.     Findings: No erythema or rash.  Neurological:     Mental Status: He is alert and oriented to person, place, and time. Mental status is at baseline.     Cranial Nerves: No cranial nerve deficit.     Coordination: Coordination normal.  Psychiatric:        Mood and Affect: Mood normal.     LABORATORY DATA:  I have reviewed the data as listed     Latest Ref Rng & Units 10/21/2022    1:45 PM 10/17/2022    5:10 AM 10/16/2022   11:50 AM  CBC  WBC 4.0 - 10.5 K/uL 4.5  4.6  3.2   Hemoglobin 13.0 - 17.0 g/dL 8.7  7.1  5.2   Hematocrit 39.0 - 52.0 % 29.9  24.1  19.9   Platelets 150 - 400 K/uL 153  147  152       Latest Ref Rng & Units 10/17/2022    5:10 AM 10/16/2022   11:50 AM 08/23/2022    4:33 PM  CMP  Glucose 70 - 99 mg/dL 93  143  104   BUN 8 - 23 mg/dL 27  29  33   Creatinine 0.61 - 1.24 mg/dL 1.17  1.43  1.30   Sodium 135 - 145 mmol/L 142  140  139   Potassium 3.5 - 5.1 mmol/L 4.0  4.4  4.5   Chloride 98 - 111 mmol/L 110  108  111   CO2 22 - 32 mmol/L _0 Calcium 8.9 - 10.3 mg/dL 8.3  8.5  8.3     Iron/TIBC/Ferritin/ %Sat    Component Value Date/Time   IRON 67 10/21/2022 1345   IRON 63 (L) 10/10/2013 1130   TIBC 424 10/21/2022 1345   TIBC 416 10/10/2013 1130   FERRITIN 17 (L) 10/21/2022 1345   FERRITIN 90 10/10/2013 1130   IRONPCTSAT 16 (L) 10/21/2022 1345   IRONPCTSAT 15 10/10/2013 1130       RADIOGRAPHIC STUDIES: I have personally reviewed the radiological images as listed and agreed with the findings in the report. No results found.

## 2022-10-21 NOTE — Assessment & Plan Note (Addendum)
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 Labs reviewed and discussed with patient. Consistent with persistent iron deficiency anemia. Recommend IV Venofer 200 mg x 3 doses Repeat blood work in 2 weeks, then Venofer Q2 weeks x 3.

## 2022-10-21 NOTE — Addendum Note (Signed)
Addended by: Earlie Server on: 10/21/2022 08:52 PM   Modules accepted: Orders

## 2022-10-21 NOTE — Assessment & Plan Note (Signed)
IV Venofer treatment to improve iron store

## 2022-10-21 NOTE — Patient Instructions (Signed)

## 2022-10-22 ENCOUNTER — Telehealth: Payer: Self-pay

## 2022-10-22 NOTE — Telephone Encounter (Signed)
Please schedule pt as requested below and inform pt/daughter of appts.

## 2022-10-22 NOTE — Telephone Encounter (Signed)
Referral was faxed 2 times on 10/21/22 and 2 times on 10/22/2022

## 2022-10-22 NOTE — Telephone Encounter (Signed)
-----   Message from Earlie Server, MD sent at 10/21/2022  8:53 PM EST ----- Keep currently scheduled IV venofer.  Please arrange additional lab encounter + Venofer on 1/24, 2/7, 2/21 Labs are ordered.

## 2022-10-27 ENCOUNTER — Inpatient Hospital Stay: Payer: Medicare Other | Attending: Oncology

## 2022-10-27 VITALS — BP 154/51 | HR 75 | Temp 98.1°F

## 2022-10-27 DIAGNOSIS — N1831 Chronic kidney disease, stage 3a: Secondary | ICD-10-CM | POA: Insufficient documentation

## 2022-10-27 DIAGNOSIS — D500 Iron deficiency anemia secondary to blood loss (chronic): Secondary | ICD-10-CM | POA: Diagnosis not present

## 2022-10-27 DIAGNOSIS — D631 Anemia in chronic kidney disease: Secondary | ICD-10-CM | POA: Insufficient documentation

## 2022-10-27 MED ORDER — SODIUM CHLORIDE 0.9 % IV SOLN
INTRAVENOUS | Status: DC
  Filled 2022-10-27: qty 250

## 2022-10-27 MED ORDER — SODIUM CHLORIDE 0.9 % IV SOLN
200.0000 mg | Freq: Once | INTRAVENOUS | Status: AC
  Administered 2022-10-27: 200 mg via INTRAVENOUS
  Filled 2022-10-27: qty 200

## 2022-10-27 NOTE — Patient Instructions (Signed)

## 2022-10-28 ENCOUNTER — Ambulatory Visit: Payer: Medicare Other | Admitting: Neurology

## 2022-11-01 NOTE — Telephone Encounter (Signed)
Wife reports that she has not heard from [facility] regarding the referral...  I faxed th referral 6 times over a 3 day period and it was labeled as URGENT  Phone number given for her to reach out and I let her know that I will reach out as well

## 2022-11-03 ENCOUNTER — Other Ambulatory Visit: Payer: Self-pay | Admitting: Family Medicine

## 2022-11-03 DIAGNOSIS — E349 Endocrine disorder, unspecified: Secondary | ICD-10-CM

## 2022-11-03 DIAGNOSIS — G609 Hereditary and idiopathic neuropathy, unspecified: Secondary | ICD-10-CM

## 2022-11-03 NOTE — Telephone Encounter (Signed)
Requested medication (s) are due for refill today - yes  Requested medication (s) are on the active medication list -yes  Future visit scheduled -yes  Last refill: 07/02/22 #60 3RF  Notes to clinic: non delegated Rx  Requested Prescriptions  Pending Prescriptions Disp Refills   pregabalin (LYRICA) 200 MG capsule 60 capsule 3    Sig: Take 1 capsule (200 mg total) by mouth 2 (two) times daily.     Not Delegated - Neurology:  Anticonvulsants - Controlled - pregabalin Failed - 11/03/2022 11:12 AM      Failed - This refill cannot be delegated      Passed - Cr in normal range and within 360 days    Creatinine  Date Value Ref Range Status  08/31/2013 1.53 (H) 0.60 - 1.30 mg/dL Final   Creatinine, Ser  Date Value Ref Range Status  10/17/2022 1.17 0.61 - 1.24 mg/dL Final         Passed - Completed PHQ-2 or PHQ-9 in the last 360 days      Passed - Valid encounter within last 12 months    Recent Outpatient Visits           2 months ago Acute cough   [place] [facility] [place], Deanna C, MD   4 months ago Essential hypertension   [place] [facility] at [place], Deanna C, MD   8 months ago Pneumonia of lower lobe due to infectious organism, unspecified laterality   [place] [facility] [place], Deanna C, MD   10 months ago Essential hypertension   [place] [facility] at [place], Deanna C, MD   1 year ago Hospital discharge follow-up   [facility] at [place], Deanna C, MD       Future Appointments             In 1 month Juline Patch, MD [facility] at [facility], [facility]               Requested Prescriptions  Pending Prescriptions Disp Refills   pregabalin (LYRICA) 200 MG capsule 60 capsule 3    Sig: Take 1 capsule (200 mg  total) by mouth 2 (two) times daily.     Not Delegated - Neurology:  Anticonvulsants - Controlled - pregabalin Failed - 11/03/2022 11:12 AM      Failed - This refill cannot be delegated      Passed - Cr in normal range and within 360 days    Creatinine  Date Value Ref Range Status  08/31/2013 1.53 (H) 0.60 - 1.30 mg/dL Final   Creatinine, Ser  Date Value Ref Range Status  10/17/2022 1.17 0.61 - 1.24 mg/dL Final         Passed - Completed PHQ-2 or PHQ-9 in the last 360 days      Passed - Valid encounter within last 12 months    Recent Outpatient Visits           2 months ago Acute cough   [place] [facility] at [place], Deanna C, MD   4 months ago Essential hypertension    [facility] at [facility], MD   8 months ago Pneumonia of lower lobe due to infectious organism,  unspecified laterality   [place] [facility] at [place], Deanna C, MD   10 months ago Essential hypertension   [place] [facility] at [place], Deanna C, MD   1 year ago Hospital discharge follow-up   [facility] at [facility], MD       Future Appointments             In 1 month Juline Patch, MD [place] [facility] at [facility], [facility]

## 2022-11-03 NOTE — Telephone Encounter (Signed)
Medication Refill - Medication: pregabalin (LYRICA) 200 MG capsule   Has the patient contacted their pharmacy? Yes.     Preferred Pharmacy (with phone number or street name): [facility], [place] Phone: [phone]  Fax: [phone]   Has the patient been seen for an appointment in the last year OR does the patient have an upcoming appointment? Yes.    The patient has 4 days left on this prescription. Please assist patient further

## 2022-11-04 ENCOUNTER — Inpatient Hospital Stay: Payer: Medicare Other

## 2022-11-04 VITALS — BP 152/83 | HR 85 | Temp 97.5°F | Resp 18

## 2022-11-04 DIAGNOSIS — D500 Iron deficiency anemia secondary to blood loss (chronic): Secondary | ICD-10-CM

## 2022-11-04 DIAGNOSIS — N1831 Chronic kidney disease, stage 3a: Secondary | ICD-10-CM

## 2022-11-04 DIAGNOSIS — D631 Anemia in chronic kidney disease: Secondary | ICD-10-CM | POA: Diagnosis not present

## 2022-11-04 LAB — CBC WITH DIFFERENTIAL/PLATELET
Abs Immature Granulocytes: 0.03 10*3/uL (ref 0.00–0.07)
Basophils Absolute: 0.1 10*3/uL (ref 0.0–0.1)
Basophils Relative: 1 %
Eosinophils Absolute: 0.3 10*3/uL (ref 0.0–0.5)
Eosinophils Relative: 7 %
HCT: 25.2 % — ABNORMAL LOW (ref 39.0–52.0)
Hemoglobin: 7.6 g/dL — ABNORMAL LOW (ref 13.0–17.0)
Immature Granulocytes: 1 %
Lymphocytes Relative: 16 %
Lymphs Abs: 0.8 10*3/uL (ref 0.7–4.0)
MCH: 27 pg (ref 26.0–34.0)
MCHC: 30.2 g/dL (ref 30.0–36.0)
MCV: 89.4 fL (ref 80.0–100.0)
Monocytes Absolute: 0.4 10*3/uL (ref 0.1–1.0)
Monocytes Relative: 9 %
Neutro Abs: 3.1 10*3/uL (ref 1.7–7.7)
Neutrophils Relative %: 66 %
Platelets: 234 10*3/uL (ref 150–400)
RBC: 2.82 MIL/uL — ABNORMAL LOW (ref 4.22–5.81)
RDW: 21.9 % — ABNORMAL HIGH (ref 11.5–15.5)
WBC: 4.7 10*3/uL (ref 4.0–10.5)
nRBC: 0 % (ref 0.0–0.2)

## 2022-11-04 LAB — RETIC PANEL
Immature Retic Fract: 26.4 % — ABNORMAL HIGH (ref 2.3–15.9)
RBC.: 2.84 MIL/uL — ABNORMAL LOW (ref 4.22–5.81)
Retic Count, Absolute: 116.2 10*3/uL (ref 19.0–186.0)
Retic Ct Pct: 4.1 % — ABNORMAL HIGH (ref 0.4–3.1)
Reticulocyte Hemoglobin: 24.5 pg — ABNORMAL LOW (ref >27.9)

## 2022-11-04 MED ORDER — SODIUM CHLORIDE 0.9 % IV SOLN
200.0000 mg | Freq: Once | INTRAVENOUS | Status: AC
  Administered 2022-11-04: 200 mg via INTRAVENOUS
  Filled 2022-11-04: qty 200

## 2022-11-04 MED ORDER — SODIUM CHLORIDE 0.9 % IV SOLN
INTRAVENOUS | Status: AC
  Filled 2022-11-04: qty 250

## 2022-11-04 NOTE — Telephone Encounter (Signed)
Patient is requesting that rx be sent to  [facility], [place] [address] AT [place] Phone: [phone]  Fax: [phone]    Patient only has 3 days left and he will run out before [facility] could deliver it.

## 2022-11-04 NOTE — Patient Instructions (Signed)

## 2022-11-05 ENCOUNTER — Other Ambulatory Visit: Payer: Self-pay | Admitting: Neurology

## 2022-11-09 ENCOUNTER — Telehealth: Payer: Self-pay

## 2022-11-09 NOTE — Telephone Encounter (Signed)
-----  Message from Earlie Server, MD sent at 11/08/2022 10:12 PM EST ----- Hemoglobin has improved and then decreased again.  I recommend additional IV venofer weekly x 4. Keep same follow up appt.

## 2022-11-09 NOTE — Telephone Encounter (Signed)
Last f/u 10/05/22, refill needed.

## 2022-11-09 NOTE — Telephone Encounter (Signed)
Patient is already on schedule for 1/24 and 2/7. He will need an additional venofer appointment on 1/31 and 2/14. [place] I have called patient and explained this to him. Can you schedule and notify patient of dates and times.

## 2022-11-16 ENCOUNTER — Encounter: Payer: Self-pay | Admitting: Family Medicine

## 2022-11-16 ENCOUNTER — Ambulatory Visit
Admission: RE | Admit: 2022-11-16 | Discharge: 2022-11-16 | Disposition: A | Discharge status: Home / Self Care | Payer: Medicare Other | Attending: Family Medicine | Admitting: Family Medicine

## 2022-11-16 ENCOUNTER — Ambulatory Visit
Admission: RE | Admit: 2022-11-16 | Discharge: 2022-11-16 | Disposition: A | Discharge status: Home / Self Care | Payer: Medicare Other | Source: Ambulatory Visit | Attending: Family Medicine | Admitting: Family Medicine

## 2022-11-16 ENCOUNTER — Ambulatory Visit (INDEPENDENT_AMBULATORY_CARE_PROVIDER_SITE_OTHER): Payer: Medicare Other | Admitting: Family Medicine

## 2022-11-16 VITALS — BP 128/60 | HR 86 | Ht 69.0 in | Wt 220.0 lb

## 2022-11-16 DIAGNOSIS — R051 Acute cough: Secondary | ICD-10-CM

## 2022-11-16 DIAGNOSIS — R059 Cough, unspecified: Secondary | ICD-10-CM | POA: Diagnosis not present

## 2022-11-16 DIAGNOSIS — R0602 Shortness of breath: Secondary | ICD-10-CM | POA: Diagnosis not present

## 2022-11-16 DIAGNOSIS — J449 Chronic obstructive pulmonary disease, unspecified: Secondary | ICD-10-CM | POA: Insufficient documentation

## 2022-11-16 DIAGNOSIS — R911 Solitary pulmonary nodule: Secondary | ICD-10-CM | POA: Diagnosis not present

## 2022-11-16 DIAGNOSIS — R0600 Dyspnea, unspecified: Secondary | ICD-10-CM | POA: Diagnosis not present

## 2022-11-16 MED FILL — Iron Sucrose Inj 20 MG/ML (Fe Equiv): INTRAVENOUS | Qty: 10 | Status: AC

## 2022-11-16 NOTE — Patient Instructions (Signed)
Why follow it? Research shows. Those who follow the Mediterranean diet have a reduced risk of heart disease  The diet is associated with a reduced incidence of Parkinson's and Alzheimer's diseases People following the diet may have longer life expectancies and lower rates of chronic diseases  The Dietary Guidelines for Americans recommends the Mediterranean diet as an eating plan to promote health and prevent disease  What Is the Mediterranean Diet?  Healthy eating plan based on typical foods and recipes of Mediterranean-style cooking The diet is primarily a plant based diet; these foods should make up a majority of meals   Starches - Plant based foods should make up a majority of meals - They are an important sources of vitamins, minerals, energy, antioxidants, and fiber - Choose whole grains, foods high in fiber and minimally processed items  - Typical grain sources include wheat, oats, barley, corn, brown rice, bulgar, farro, millet, polenta, couscous  - Various types of beans include chickpeas, lentils, fava beans, black beans, white beans   Fruits  Veggies - Large quantities of antioxidant rich fruits & veggies; 6 or more servings  - Vegetables can be eaten raw or lightly drizzled with oil and cooked  - Vegetables common to the traditional Mediterranean Diet include: artichokes, arugula, beets, broccoli, brussel sprouts, cabbage, carrots, celery, collard greens, cucumbers, eggplant, kale, leeks, lemons, lettuce, mushrooms, okra, onions, peas, peppers, potatoes, pumpkin, radishes, rutabaga, shallots, spinach, sweet potatoes, turnips, zucchini - Fruits common to the Mediterranean Diet include: apples, apricots, avocados, cherries, clementines, dates, figs, grapefruits, grapes, melons, nectarines, oranges, peaches, pears, pomegranates, strawberries, tangerines  Fats - Replace butter and margarine with healthy oils, such as olive oil, canola oil, and tahini  - Limit nuts to no more than a  handful a day  - Nuts include walnuts, almonds, pecans, pistachios, pine nuts  - Limit or avoid candied, honey roasted or heavily salted nuts - Olives are central to the [employer] - can be eaten whole or used in a variety of dishes   Meats Protein - Limiting red meat: no more than a few times a month - When eating red meat: choose lean cuts and keep the portion to the size of deck of cards - Eggs: approx. 0 to 4 times a week  - Fish and lean poultry: at least 2 a week  - Healthy protein sources include, chicken, [place], lean beef, lamb - Increase intake of seafood such as tuna, salmon, trout, mackerel, shrimp, scallops - Avoid or limit high fat processed meats such as sausage and bacon  Dairy - Include moderate amounts of low fat dairy products  - Focus on healthy dairy such as fat free yogurt, skim milk, low or reduced fat cheese - Limit dairy products higher in fat such as whole or 2% milk, cheese, ice cream  Alcohol - Moderate amounts of red wine is ok  - No more than 5 oz daily for women (all ages) and men older than age 56  - No more than 10 oz of wine daily for men younger than 36  Other - Limit sweets and other desserts  - Use herbs and spices instead of salt to flavor foods  - Herbs and spices common to the traditional Mediterranean Diet include: basil, bay leaves, chives, cloves, cumin, fennel, garlic, lavender, marjoram, mint, oregano, parsley, pepper, rosemary, sage, savory, sumac, tarragon, thyme   It's not just a diet, it's a lifestyle:  The Mediterranean diet includes lifestyle factors typical of those in the  region  Foods, drinks and meals are best eaten with others and savored Daily physical activity is important for overall good health This could be strenuous exercise like running and aerobics This could also be more leisurely activities such as walking, housework, yard-work, or taking the stairs Moderation is the key; a balanced and healthy diet accommodates most  foods and drinks Consider portion sizes and frequency of consumption of certain foods   Meal Ideas & Options:  Breakfast:  Whole wheat toast or whole wheat English muffins with peanut butter & hard boiled egg Steel cut oats topped with apples & cinnamon and skim milk  Fresh fruit: banana, strawberries, melon, berries, peaches  Smoothies: strawberries, bananas, greek yogurt, peanut butter Low fat greek yogurt with blueberries and granola  Egg white omelet with spinach and mushrooms Breakfast couscous: whole wheat couscous, apricots, skim milk, cranberries  Sandwiches:  Hummus and grilled vegetables (peppers, zucchini, squash) on whole wheat bread   Grilled chicken on whole wheat pita with lettuce, tomatoes, cucumbers or tzatziki  Tuna salad on whole wheat bread: tuna salad made with greek yogurt, olives, red peppers, capers, green onions Garlic rosemary lamb pita: lamb sauted with garlic, rosemary, salt & pepper; add lettuce, cucumber, greek yogurt to pita - flavor with lemon juice and black pepper  Seafood:  Mediterranean grilled salmon, seasoned with garlic, basil, parsley, lemon juice and black pepper Shrimp, lemon, and spinach whole-grain pasta salad made with low fat greek yogurt  Seared scallops with lemon orzo  Seared tuna steaks seasoned salt, pepper, coriander topped with tomato mixture of olives, tomatoes, olive oil, minced garlic, parsley, green onions and cappers  Meats:  Herbed greek chicken salad with kalamata olives, cucumber, feta  Red bell peppers stuffed with spinach, bulgur, lean ground beef (or lentils) & topped with feta   Kebabs: skewers of chicken, tomatoes, onions, zucchini, squash  Turkey burgers: made with red onions, mint, dill, lemon juice, feta cheese topped with roasted red peppers Vegetarian Cucumber salad: cucumbers, artichoke hearts, celery, red onion, feta cheese, tossed in olive oil & lemon juice  Hummus and whole grain pita points with a greek salad  (lettuce, tomato, feta, olives, cucumbers, red onion) Lentil soup with celery, carrots made with vegetable broth, garlic, salt and pepper  Tabouli salad: parsley, bulgur, mint, scallions, cucumbers, tomato, radishes, lemon juice, olive oil, salt and pepper.      

## 2022-11-16 NOTE — Progress Notes (Unsigned)
Date:  11/16/2022   Name:  Jeremy [name]   DOB:  1951/09/15   MRN:  [id number]   Chief Complaint: Cough (Started Sunday/ 2 days ago with cough- green/yellow production. Oxygen level is 90 at best. )  Cough This is a new problem. The current episode started in the past 7 days (sunday). The problem has been unchanged. The problem occurs every few minutes. Associated symptoms include nasal congestion, postnasal drip, rhinorrhea and shortness of breath. Pertinent negatives include no chest pain, chills, fever, hemoptysis, sore throat or wheezing. Nothing aggravates the symptoms. He has tried nothing for the symptoms.  Shortness of Breath The current episode started yesterday. Associated symptoms include rhinorrhea and sputum production. Pertinent negatives include no chest pain, fever, hemoptysis, leg swelling, orthopnea, PND, sore throat or wheezing.    Lab Results  Component Value Date   NA 142 10/17/2022   K 4.0 10/17/2022   CO2 23 10/17/2022   GLUCOSE 93 10/17/2022   BUN 27 (H) 10/17/2022   CREATININE 1.17 10/17/2022   CALCIUM 8.3 (L) 10/17/2022   EGFR 53 (L) 02/15/2022   GFRNONAA >60 10/17/2022   Lab Results  Component Value Date   CHOL 103 07/10/2021   HDL 22 (L) 07/10/2021   LDLCALC 35 07/10/2021   TRIG 228 (H) 07/10/2021   CHOLHDL 4.7 07/10/2021   Lab Results  Component Value Date   TSH 1.90 03/30/2021   Lab Results  Component Value Date   HGBA1C 4.8 04/05/2022   Lab Results  Component Value Date   WBC 4.7 11/04/2022   HGB 7.6 (L) 11/04/2022   HCT 25.2 (L) 11/04/2022   MCV 89.4 11/04/2022   PLT 234 11/04/2022   Lab Results  Component Value Date   ALT 10 08/18/2022   AST 20 08/18/2022   ALKPHOS 61 08/18/2022   BILITOT 0.5 08/18/2022   No results found for: "25OHVITD2", "25OHVITD3", "VD25OH"   Review of Systems  Constitutional:  Negative for chills and fever.  HENT:  Positive for postnasal drip and rhinorrhea. Negative for sore throat.   Respiratory:   Positive for cough, sputum production and shortness of breath. Negative for hemoptysis, wheezing and stridor.   Cardiovascular:  Negative for chest pain, palpitations, orthopnea, leg swelling and PND.  Genitourinary:  Negative for difficulty urinating.    Patient Active Problem List   Diagnosis Date Noted   Arteriovenous malformation (AVM) 10/21/2022   Weakness 08/18/2022   AKI (acute kidney injury) ([place]) 08/18/2022   Severe sepsis ([place]) 02/04/2022   Pneumonia 02/03/2022   Acute on chronic respiratory failure with hypoxia ([place]) 02/03/2022   Hyperkalemia 02/03/2022   COPD (chronic obstructive pulmonary disease) ([place]) 02/03/2022   SIRS (systemic inflammatory response syndrome), possible sepsis ([place]) 02/03/2022   Anemia due to blood loss 09/24/2021   Gastric polyp    Gastritis without bleeding    Anemia in stage 3a chronic kidney disease ([place]) 06/19/2021   Iron deficiency anemia due to chronic blood loss 06/19/2021   Gait abnormality 05/07/2021   Stage 3a chronic kidney disease ([place]) 05/20/2020   DM type 2 with diabetic peripheral neuropathy ([place]) 05/06/2020   Idiopathic peripheral neuropathy 01/15/2020   Generalized edema 01/15/2020   Primary pulmonary hypertension ([place]) 10/28/2019   Therapeutic drug monitoring 03/23/2018   Mild episode of recurrent major depressive disorder ([place]) 09/26/2017   Ventricular ectopic beats 09/26/2017   Type 2 diabetes mellitus with diabetic polyneuropathy, with long-term current use of insulin ([place]) 08/12/2017   Hyperlipidemia due to type  2 diabetes mellitus ([place]) 08/12/2017   B12 deficiency 12/14/2016   Low back pain 03/10/2016   Lumbosacral disc disease 01/01/2016   Neuropathy associated with endocrine disorder ([place]) 11/19/2015   B12 deficiency anemia 06/03/2015   Essential hypertension 06/03/2015   Hyperlipidemia 06/03/2015   Depression 06/03/2015   Gastroesophageal reflux disease without esophagitis 06/03/2015   Edema extremities 06/03/2015    Multiple sclerosis ([place]) 07/26/2013   Abnormality of gait 07/26/2013   Morbid obesity ([place]) 07/26/2013    Allergies  Allergen Reactions   Betadine [Povidone Iodine]     Past Surgical History:  Procedure Laterality Date   COLECTOMY  06-2008   COLONOSCOPY  2015   normal   COLONOSCOPY WITH PROPOFOL N/A 09/10/2021   Procedure: COLONOSCOPY WITH PROPOFOL;  Surgeon: [name], MD;  Location: [facility] ENDOSCOPY;  Service: Endoscopy;  Laterality: N/A;   ESOPHAGOGASTRODUODENOSCOPY (EGD) WITH PROPOFOL N/A 09/10/2021   Procedure: ESOPHAGOGASTRODUODENOSCOPY (EGD) WITH PROPOFOL;  Surgeon: [name], MD;  Location: [facility] ENDOSCOPY;  Service: Endoscopy;  Laterality: N/A;   GIVENS CAPSULE STUDY N/A 09/25/2021   Procedure: GIVENS CAPSULE STUDY;  Surgeon: Jonathon Bellows, MD;  Location: [facility] ENDOSCOPY;  Service: Gastroenterology;  Laterality: N/A;    Social History   Tobacco Use   Smoking status: Former    Packs/day: 1.50    Years: 30.00    Total pack years: 45.00    Types: Cigarettes    Quit date: 01/23/2006    Years since quitting: 16.8   Smokeless tobacco: Never   Tobacco comments:    N/A  Vaping Use   Vaping Use: Never used  Substance Use Topics   Alcohol use: Not Currently    Comment: rare; maybe 2 beers a year   Drug use: No     Medication list has been reviewed and updated.  Current Meds  Medication Sig   albuterol (VENTOLIN HFA) 108 (90 Base) MCG/ACT inhaler Inhale 2 puffs into the lungs in the morning, at noon, in the evening, and at bedtime.   aspirin 81 MG tablet Take 81 mg by mouth daily.   B-D INSULIN SYRINGE 1CC/25GX1" 25G X 1" 1 ML MISC USE AS DIRECTED   cyanocobalamin (VITAMIN B12) 500 MCG tablet Take 1,000 mcg by mouth daily.   ferrous sulfate 325 (65 FE) MG tablet Take 325 mg by mouth daily with breakfast.   gemfibrozil (LOPID) 600 MG tablet TAKE 1 TABLET BY MOUTH TWICE  DAILY BEFORE MEALS (Patient taking differently: Take 600 mg by mouth 2 (two) times daily before a  meal. TAKE 1 TABLET BY MOUTH TWICE  DAILY BEFORE MEALS)   insulin glargine (LANTUS) 100 UNIT/ML Solostar Pen Inject 20 Units into the skin at bedtime. (Patient taking differently: Inject 26 Units into the skin at bedtime.)   Interferon Beta-1b (BETASERON) 0.3 MG KIT injection Inject subcutaneously 1 syringe every other day.   loratadine (CLARITIN) 10 MG tablet Take 1 tablet (10 mg total) by mouth daily.   metFORMIN (GLUCOPHAGE) 500 MG tablet Take 500 mg by mouth 2 (two) times daily.   mupirocin ointment (BACTROBAN) 2 % Apply 1 Application topically 2 (two) times daily. Apply to effected area bid   omeprazole (PRILOSEC) 40 MG capsule TAKE 1 CAPSULE BY MOUTH DAILY (Patient taking differently: Take 40 mg by mouth daily. TAKE 1 CAPSULE BY MOUTH DAILY)   pregabalin (LYRICA) 200 MG capsule Take 1 capsule (200 mg total) by mouth 2 (two) times daily.   sertraline (ZOLOFT) 50 MG tablet Take 1/2 tablet daily (Patient  taking differently: Take 25 mg by mouth daily. Take 1/2 tablet daily)   simvastatin (ZOCOR) 40 MG tablet Take 1 tablet (40 mg total) by mouth daily.   traMADol (ULTRAM) 50 MG tablet TAKE 1 TABLET BY MOUTH EVERY 6 HOURS AS NEEDED       11/16/2022    3:56 PM 07/02/2022    1:33 PM 02/15/2022    1:36 PM 12/28/2021    1:24 PM  GAD 7 : Generalized Anxiety Score  Nervous, Anxious, on Edge 0 0 0 0  Control/stop worrying 0 0 0 0  Worry too much - different things 0 0 0 0  Trouble relaxing 0 0 0 0  Restless 0 0 0 0  Easily annoyed or irritable 0 0 0 0  Afraid - awful might happen 0 0 0 0  Total GAD 7 Score 0 0 0 0  Anxiety Difficulty Not difficult at all Not difficult at all Not difficult at all Not difficult at all       11/16/2022    3:56 PM 07/02/2022    1:33 PM 02/15/2022    1:35 PM  Depression screen PHQ 2/9  Decreased Interest 0 0 0  Down, Depressed, Hopeless 0 0 0  PHQ - 2 Score 0 0 0  Altered sleeping 0 0 0  Tired, decreased energy 0 0 0  Change in appetite 0 0 0  Feeling bad or  failure about yourself  0 0 0  Trouble concentrating 0 0 0  Moving slowly or fidgety/restless 0 0 0  Suicidal thoughts 0 0 0  PHQ-9 Score 0 0 0  Difficult doing work/chores Not difficult at all Not difficult at all Not difficult at all    BP Readings from Last 3 Encounters:  11/16/22 128/60  11/04/22 (!) 152/83  10/27/22 (!) 154/51    Physical Exam Vitals and nursing note reviewed.  HENT:     Head: Normocephalic.     Right Ear: Tympanic membrane, ear canal and external ear normal. There is no impacted cerumen.     Left Ear: Tympanic membrane, ear canal and external ear normal. There is no impacted cerumen.     Nose: Nose normal. No congestion or rhinorrhea.     Mouth/Throat:     Mouth: Mucous membranes are moist.  Eyes:     Pupils: Pupils are equal, round, and reactive to light.  Cardiovascular:     Chest Wall: PMI is not displaced.     Heart sounds: Normal heart sounds, S1 normal and S2 normal. No murmur heard.    No systolic murmur is present.     No diastolic murmur is present.     No friction rub. No gallop. No S3 or S4 sounds.  Pulmonary:     Effort: Pulmonary effort is normal.     Breath sounds: No wheezing, rhonchi or rales.  Musculoskeletal:     Cervical back: Normal range of motion.     Right lower leg: No edema.     Left lower leg: No edema.     Wt Readings from Last 3 Encounters:  11/16/22 220 lb (99.8 kg)  10/21/22 221 lb 3.2 oz (100.3 kg)  10/16/22 210 lb (95.3 kg)    BP 128/60   Pulse 86   Ht '5\' 9"'$  (1.753 m)   Wt 220 lb (99.8 kg)   SpO2 90%   BMI 32.49 kg/m   Assessment and Plan:  1. Acute cough New onset.  Episodic.  Relatively stable.  Will obtain chest x-ray rule out pneumonia. - DG Chest 2 View  2. Chronic obstructive pulmonary disease, unspecified COPD type (HCC) Chronic.  Episodic.  Relatively stable.  Only on albuterol.  Will initiate a Breztri 2 puffs twice a day. - DG Chest 2 View  3. Shortness of breath As noted above. - DG  Chest 2 View   Review of x-ray on 11/17/2022 notes no cardiopulmonary concern.  Continue with inhalers and we will initiate azithromycin. Otilio Miu, MD

## 2022-11-17 ENCOUNTER — Telehealth: Payer: Self-pay | Admitting: *Deleted

## 2022-11-17 ENCOUNTER — Ambulatory Visit: Payer: Medicare Other

## 2022-11-17 ENCOUNTER — Other Ambulatory Visit: Payer: Self-pay

## 2022-11-17 ENCOUNTER — Encounter: Payer: Self-pay | Admitting: Family Medicine

## 2022-11-17 ENCOUNTER — Inpatient Hospital Stay: Payer: Medicare Other

## 2022-11-17 DIAGNOSIS — R051 Acute cough: Secondary | ICD-10-CM

## 2022-11-17 MED ORDER — AZITHROMYCIN 250 MG PO TABS: ORAL_TABLET | ORAL | 0 refills | Status: AC

## 2022-11-17 NOTE — Telephone Encounter (Signed)
Patient wife called reporting that patient is not feeling well , has a cough and is veery weak and was ordered antibiotics and cough medicine by PCP this morning. He wants to cancel his appts today which has been done by scheduler.

## 2022-11-19 ENCOUNTER — Ambulatory Visit (INDEPENDENT_AMBULATORY_CARE_PROVIDER_SITE_OTHER): Payer: Medicare Other

## 2022-11-19 DIAGNOSIS — Z0000 Encounter for general adult medical examination without abnormal findings: Secondary | ICD-10-CM

## 2022-11-19 NOTE — Progress Notes (Signed)
I connected with  Jeremy [name] on 11/19/22 by a audio enabled telemedicine application and verified that I am speaking with the correct person using two identifiers.  Patient Location: Home  Provider Location: Office/Clinic  I discussed the limitations of evaluation and management by telemedicine. The patient expressed understanding and agreed to proceed.  Subjective:   Jeremy [name] is a 72 y.o. male who presents for Medicare Annual/Subsequent preventive examination.  Review of Systems    Per HPI unless specifically indicated below.  Cardiac Risk Factors include: advanced age (>29mn, >>28women);male gender          Objective:    Today's Vitals   11/19/22 0919  PainSc: 0-No pain   There is no height or weight on file to calculate BMI.     11/19/2022    9:29 AM 11/04/2022    1:00 PM 10/27/2022    2:45 PM 10/21/2022    1:00 PM 10/16/2022   11:48 AM 08/18/2022    7:23 PM 06/15/2022    2:07 PM  Advanced Directives  Does Patient Have a Medical Advance Directive? No No No No No No No  Would patient like information on creating a medical advance directive? Yes (ED - Information included in AVS) No - Patient declined No - Patient declined No - Patient declined  No - Patient declined No - Patient declined    Current Medications (verified) Outpatient Encounter Medications as of 11/19/2022  Medication Sig   albuterol (VENTOLIN HFA) 108 (90 Base) MCG/ACT inhaler Inhale 2 puffs into the lungs in the morning, at noon, in the evening, and at bedtime.   aspirin 81 MG tablet Take 81 mg by mouth daily.   azithromycin (ZITHROMAX) 250 MG tablet Take 2 tablets on day 1, then 1 tablet daily on days 2 through 5   B-D INSULIN SYRINGE 1CC/25GX1" 25G X 1" 1 ML MISC USE AS DIRECTED   cyanocobalamin (VITAMIN B12) 500 MCG tablet Take 1,000 mcg by mouth daily.   ferrous sulfate 325 (65 FE) MG tablet Take 325 mg by mouth daily with breakfast.   gemfibrozil (LOPID) 600 MG tablet TAKE 1 TABLET BY  MOUTH TWICE  DAILY BEFORE MEALS (Patient taking differently: Take 600 mg by mouth 2 (two) times daily before a meal. TAKE 1 TABLET BY MOUTH TWICE  DAILY BEFORE MEALS)   insulin glargine (LANTUS) 100 UNIT/ML Solostar Pen Inject 20 Units into the skin at bedtime. (Patient taking differently: Inject 26 Units into the skin at bedtime.)   Interferon Beta-1b (BETASERON) 0.3 MG KIT injection Inject subcutaneously 1 syringe every other day.   loratadine (CLARITIN) 10 MG tablet Take 1 tablet (10 mg total) by mouth daily.   metFORMIN (GLUCOPHAGE) 500 MG tablet Take 500 mg by mouth 2 (two) times daily.   mupirocin ointment (BACTROBAN) 2 % Apply 1 Application topically 2 (two) times daily. Apply to effected area bid   omeprazole (PRILOSEC) 40 MG capsule TAKE 1 CAPSULE BY MOUTH DAILY (Patient taking differently: Take 40 mg by mouth daily. TAKE 1 CAPSULE BY MOUTH DAILY)   pregabalin (LYRICA) 200 MG capsule Take 1 capsule (200 mg total) by mouth 2 (two) times daily.   sertraline (ZOLOFT) 50 MG tablet Take 1/2 tablet daily (Patient taking differently: Take 25 mg by mouth daily. Take 1/2 tablet daily)   simvastatin (ZOCOR) 40 MG tablet Take 1 tablet (40 mg total) by mouth daily.   sodium bicarbonate 650 MG tablet Take 650 mg by mouth 2 (two) times daily.  traMADol (ULTRAM) 50 MG tablet TAKE 1 TABLET BY MOUTH EVERY 6 HOURS AS NEEDED   Facility-Administered Encounter Medications as of 11/19/2022  Medication   0.9 %  sodium chloride infusion    Allergies (verified) Betadine [povidone iodine]   History: Past Medical History:  Diagnosis Date   Chronic pain    Depression    Diabetes (HCC)    GERD (gastroesophageal reflux disease)    Hyperlipemia    Hypertension    MS (multiple sclerosis) ([place])    Past Surgical History:  Procedure Laterality Date   COLECTOMY  06-2008   COLONOSCOPY  2015   normal   COLONOSCOPY WITH PROPOFOL N/A 09/10/2021   Procedure: COLONOSCOPY WITH PROPOFOL;  Surgeon: [name],  MD;  Location: [facility] ENDOSCOPY;  Service: Endoscopy;  Laterality: N/A;   ESOPHAGOGASTRODUODENOSCOPY (EGD) WITH PROPOFOL N/A 09/10/2021   Procedure: ESOPHAGOGASTRODUODENOSCOPY (EGD) WITH PROPOFOL;  Surgeon: [name], MD;  Location: [facility] ENDOSCOPY;  Service: Endoscopy;  Laterality: N/A;   GIVENS CAPSULE STUDY N/A 09/25/2021   Procedure: GIVENS CAPSULE STUDY;  Surgeon: Jonathon Bellows, MD;  Location[facility] ENDOSCOPY;  Service: Gastroenterology;  Laterality: N/A;   Family History  Problem Relation Age of Onset   Lung cancer Mother    Heart attack Father    Heart attack Brother    COPD Brother    Diabetes Brother    Esophageal cancer Nephew    Social History   Socioeconomic History   Marital status: Married    Spouse name: Jeremy [name]   Number of children: 2   Years of education: GED   Highest education level: Not on file  Occupational History    Comment: Disabled  Tobacco Use   Smoking status: Former    Packs/day: 1.50    Years: 30.00    Total pack years: 45.00    Types: Cigarettes    Quit date: 01/23/2006    Years since quitting: 16.8   Smokeless tobacco: Never   Tobacco comments:    N/A  Vaping Use   Vaping Use: Never used  Substance and Sexual Activity   Alcohol use: Not Currently    Comment: rare; maybe 2 beers a year   Drug use: No   Sexual activity: Not Currently  Other Topics Concern   Not on file  Social History Narrative   Patient is disabled.    Patient lives with his wife Jeremy [name].    Patient has 2 children.       Social Determinants of Health   Financial Resource Strain: Low Risk  (11/19/2022)   Overall Financial Resource Strain (CARDIA)    Difficulty of Paying Living Expenses: Not hard at all  Food Insecurity: No Food Insecurity (11/19/2022)   Hunger Vital Sign    Worried About Running Out of Food in the Last Year: Never true    Ran Out of Food in the Last Year: Never true  Transportation Needs: No Transportation Needs (11/19/2022)   PRAPARE - Armed forces logistics/support/administrative officer (Medical): No    Lack of Transportation (Non-Medical): No  Physical Activity: Insufficiently Active (11/19/2022)   Exercise Vital Sign    Days of Exercise per Week: 3 days    Minutes of Exercise per Session: 20 min  Stress: No Stress Concern Present (11/19/2022)   [place]    Feeling of Stress : Not at all  Social Connections: Moderately Isolated (11/19/2022)   Social Connection and Isolation Panel [NHANES]  Frequency of Communication with Friends and Family: More than three times a week    Frequency of Social Gatherings with Friends and Family: More than three times a week    Attends Religious Services: Never    Marine scientist or Organizations: No    Attends Music therapist: Never    Marital Status: Married    Tobacco Counseling Counseling given: No Tobacco comments: N/A   Clinical Intake:  Pre-visit preparation completed: No  Pain : No/denies pain Pain Score: 0-No pain     Nutritional Risks: Other (Comment) Diabetes: No  How often do you need to have someone help you when you read instructions, pamphlets, or other written materials from your doctor or pharmacy?: 2 - Rarely  Diabetic?no  Interpreter Needed?: No  Information entered by :: Donnie Mesa, [place]   Activities of Daily Living    11/19/2022    9:16 AM 08/18/2022    7:30 PM  In your present state of health, do you have any difficulty performing the following activities:  Hearing? 1   Vision? 1   Comment wear glasses, Dr. Leory Plowman king   Difficulty concentrating or making decisions? 1   Walking or climbing stairs? 1   Dressing or bathing? 1   Doing errands, shopping? 1 0    Patient Care Team: Juline Patch, MD as PCP - General (Family Medicine) Marcial Pacas, MD as Consulting Physician (Neurology) Samara Deist, DPM as Consulting Physician (Podiatry) Lonia Farber, MD as  Consulting Physician (Internal Medicine) Magnus Sinning, MD as Consulting Physician (Nephrology) Earlie Server, MD as Consulting Physician (Oncology)  Indicate any recent Medical Services you may have received from other than [facility] providers in the past year (date may be approximate).     Assessment:   This is a routine wellness examination for Arrin.  Hearing/Vision screen Denies any hearing issues. Wear glasses, no change in his vision. Annual Eye Exam. Dr Benay Pillow Dietary issues and exercise activities discussed: Current Exercise Habits: Home exercise routine, Type of exercise: walking, Time (Minutes): 15, Frequency (Times/Week): 3, Weekly Exercise (Minutes/Week): 45, Intensity: Mild, Exercise limited by: orthopedic condition(s);neurologic condition(s)   Goals Addressed   None    Depression Screen    11/16/2022    3:56 PM 07/02/2022    1:33 PM 02/15/2022    1:35 PM 12/28/2021    1:24 PM 11/16/2021   10:13 AM 10/05/2021    3:32 PM 06/30/2021    9:23 AM  PHQ 2/9 Scores  PHQ - 2 Score 0 0 0 0 0 0 0  PHQ- 9 Score 0 0 0 0  0 0    Fall Risk    11/19/2022    9:15 AM 11/16/2022    3:55 PM 07/02/2022    1:32 PM 11/16/2021   10:14 AM 06/30/2021    9:23 AM  Fall Risk   Falls in the past year? '1 1 1 1 1  '$ Number falls in past yr: '1 1 1 1 1  '$ Injury with Fall? 0 1 0 0 0  Risk for fall due to : Impaired balance/gait;Impaired mobility Impaired balance/gait;History of fall(s);Impaired mobility Impaired balance/gait;History of fall(s) History of fall(s);Impaired balance/gait Impaired balance/gait;History of fall(s)  Follow up  Falls evaluation completed;Education provided;Falls prevention discussed Falls evaluation completed;Falls prevention discussed Falls prevention discussed Falls evaluation completed    FALL RISK PREVENTION PERTAINING TO THE HOME:  Any stairs in or around the home? No  If so, are there any without handrails?  No  Home free of loose throw rugs in walkways, pet beds,  electrical cords, etc? Yes  Adequate lighting in your home to reduce risk of falls? Yes   ASSISTIVE DEVICES UTILIZED TO PREVENT FALLS:  Life alert? No  Use of a cane, walker or w/c? Yes Grab bars in the bathroom? No  Shower chair or bench in shower? Yes  Elevated toilet seat or a handicapped toilet? Yes   TIMED UP AND GO:  Was the test performed?  unable to perform, virtual appt  .    Cognitive Function:        11/19/2022    9:18 AM 11/05/2020    9:02 AM 11/05/2019    8:55 AM 10/04/2018    8:42 AM 09/29/2017    8:00 AM  6CIT Screen  What Year? 0 points 0 points 0 points 0 points 0 points  What month? 0 points 0 points 0 points 0 points 0 points  What time? 0 points 0 points 0 points 0 points 0 points  Count back from 20 0 points 0 points 0 points 0 points 0 points  Months in reverse 0 points 0 points 0 points 0 points 0 points  Repeat phrase 0 points 0 points 2 points 2 points 2 points  Total Score 0 points 0 points 2 points 2 points 2 points    Immunizations Immunization History  Administered Date(s) Administered   Fluad Quad(high Dose 65+) 07/16/2019, 07/07/2020, 06/30/2021, 07/02/2022   Influenza, High Dose Seasonal PF 08/09/2017, 07/06/2018   Influenza,inj,Quad PF,6+ Mos 07/28/2015, 08/06/2016   Influenza-Unspecified 07/25/2014   Moderna Sars-Covid-2 Vaccination 12/02/2019, 01/02/2020, 10/27/2020   Pneumococcal Conjugate-13 09/29/2017   Pneumococcal Polysaccharide-23 01/09/2019   Tdap 07/06/2018    TDAP status: Up to date  Flu Vaccine status: Completed at today's visit  Pneumococcal vaccine status: Completed during today's visit.  Covid-19 vaccine status: Information provided on how to obtain vaccines.   Qualifies for Shingles Vaccine? Yes   Zostavax completed No   Shingrix Completed?: No.    Education has been provided regarding the importance of this vaccine. Patient has been advised to call insurance company to determine out of pocket expense if they have  not yet received this vaccine. Advised may also receive vaccine at local pharmacy or Health Dept. Verbalized acceptance and understanding.  Screening Tests Health Maintenance  Topic Date Due   Diabetic kidney evaluation - Urine ACR  12/02/2016   HEMOGLOBIN A1C  10/05/2022   Zoster Vaccines- Shingrix (1 of 2) 02/15/2023 (Originally 05/04/2002)   FOOT EXAM  04/06/2023   OPHTHALMOLOGY EXAM  04/07/2023   Diabetic kidney evaluation - eGFR measurement  10/18/2023   Medicare Annual Wellness (AWV)  11/20/2023   DTaP/Tdap/Td (2 - Td or Tdap) 07/06/2028   COLONOSCOPY (Pts 45-6yr Insurance coverage will need to be confirmed)  09/11/2031   Pneumonia Vaccine 72 Years old  Completed   INFLUENZA VACCINE  Completed   Hepatitis C Screening  Completed   HPV VACCINES  Aged Out   COVID-19 Vaccine  Discontinued    Health Maintenance  Health Maintenance Due  Topic Date Due   Diabetic kidney evaluation - Urine ACR  12/02/2016   HEMOGLOBIN A1C  10/05/2022    Colorectal cancer screening: Type of screening: Colonoscopy. Completed 09/10/2021. Repeat every 10 years  Lung Cancer Screening: (Low Dose CT Chest recommended if Age 71-80years, 30 pack-year currently smoking OR have quit w/in 15years.) does not qualify.   Lung Cancer Screening Referral: not applicable  Additional Screening:  Hepatitis C Screening: does qualify; Completed 09/29/2017  Vision Screening: Recommended annual ophthalmology exams for early detection of glaucoma and other disorders of the eye. Is the patient up to date with their annual eye exam?  Yes  Who is the provider or what is the name of the office in which the patient attends annual eye exams? Dr. Benay Pillow  If pt is not established with a provider, would they like to be referred to a provider to establish care? No .   Dental Screening: Recommended annual dental exams for proper oral hygiene  Community Resource Referral / Chronic Care Management: CRR required this  visit?  No   CCM required this visit?  No      Plan:     I have personally reviewed and noted the following in the patient's chart:   Medical and social history Use of alcohol, tobacco or illicit drugs  Current medications and supplements including opioid prescriptions. Patient is not currently taking opioid prescriptions. Functional ability and status Nutritional status Physical activity Advanced directives List of other physicians Hospitalizations, surgeries, and ER visits in previous 12 months Vitals Screenings to include cognitive, depression, and falls Referrals and appointments  In addition, I have reviewed and discussed with patient certain preventive protocols, quality metrics, and best practice recommendations. A written personalized care plan for preventive services as well as general preventive health recommendations were provided to patient.   Mr. Sones , Thank you for taking time to come for your Medicare Wellness Visit. I appreciate your ongoing commitment to your health goals. Please review the following plan we discussed and let me know if I can assist you in the future.   These are the goals we discussed:  Goals      Prevent falls     Recommend to maintain adequate lighting in walkways, remove rugs that may cause slips or trips and continue to use cane for assistance with ambulation.        This is a list of the screening recommended for you and due dates:  Health Maintenance  Topic Date Due   Yearly kidney health urinalysis for diabetes  12/02/2016   Hemoglobin A1C  10/05/2022   Zoster (Shingles) Vaccine (1 of 2) 02/15/2023*   Complete foot exam   04/06/2023   Eye exam for diabetics  04/07/2023   Yearly kidney function blood test for diabetes  10/18/2023   Medicare Annual Wellness Visit  11/20/2023   DTaP/Tdap/Td vaccine (2 - Td or Tdap) 07/06/2028   Colon Cancer Screening  09/11/2031   Pneumonia Vaccine  Completed   Flu Shot  Completed    Hepatitis C Screening: USPSTF Recommendation to screen - Ages 18-79 yo.  Completed   HPV Vaccine  Aged Out   COVID-19 Vaccine  Discontinued  *Topic was postponed. The date shown is not the original due date.     Wilson Singer, [place]   11/19/2022   Nurse Notes: Approximately 30 minute Non-Face -To-Face Medicare Wellness Visit

## 2022-11-19 NOTE — Patient Instructions (Signed)
Health Maintenance, Male Adopting a healthy lifestyle and getting preventive care are important in promoting health and wellness. Ask your health care provider about: The right schedule for you to have regular tests and exams. Things you can do on your own to prevent diseases and keep yourself healthy. What should I know about diet, weight, and exercise? Eat a healthy diet  Eat a diet that includes plenty of vegetables, fruits, low-fat dairy products, and lean protein. Do not eat a lot of foods that are high in solid fats, added sugars, or sodium. Maintain a healthy weight Body mass index (BMI) is a measurement that can be used to identify possible weight problems. It estimates body fat based on height and weight. Your health care provider can help determine your BMI and help you achieve or maintain a healthy weight. Get regular exercise Get regular exercise. This is one of the most important things you can do for your health. Most adults should: Exercise for at least 150 minutes each week. The exercise should increase your heart rate and make you sweat (moderate-intensity exercise). Do strengthening exercises at least twice a week. This is in addition to the moderate-intensity exercise. Spend less time sitting. Even light physical activity can be beneficial. Watch cholesterol and blood lipids Have your blood tested for lipids and cholesterol at 71 years of age, then have this test every 5 years. You may need to have your cholesterol levels checked more often if: Your lipid or cholesterol levels are high. You are older than 71 years of age. You are at high risk for heart disease. What should I know about cancer screening? Many types of cancers can be detected early and may often be prevented. Depending on your health history and family history, you may need to have cancer screening at various ages. This may include screening for: Colorectal cancer. Prostate cancer. Skin cancer. Lung  cancer. What should I know about heart disease, diabetes, and high blood pressure? Blood pressure and heart disease High blood pressure causes heart disease and increases the risk of stroke. This is more likely to develop in people who have high blood pressure readings or are overweight. Talk with your health care provider about your target blood pressure readings. Have your blood pressure checked: Every 3-5 years if you are 18-39 years of age. Every year if you are 40 years old or older. If you are between the ages of 65 and 75 and are a current or former smoker, ask your health care provider if you should have a one-time screening for abdominal aortic aneurysm (AAA). Diabetes Have regular diabetes screenings. This checks your fasting blood sugar level. Have the screening done: Once every three years after age 45 if you are at a normal weight and have a low risk for diabetes. More often and at a younger age if you are overweight or have a high risk for diabetes. What should I know about preventing infection? Hepatitis B If you have a higher risk for hepatitis B, you should be screened for this virus. Talk with your health care provider to find out if you are at risk for hepatitis B infection. Hepatitis C Blood testing is recommended for: Everyone born from 1945 through 1965. Anyone with known risk factors for hepatitis C. Sexually transmitted infections (STIs) You should be screened each year for STIs, including gonorrhea and chlamydia, if: You are sexually active and are younger than 71 years of age. You are older than 71 years of age and your   health care provider tells you that you are at risk for this type of infection. Your sexual activity has changed since you were last screened, and you are at increased risk for chlamydia or gonorrhea. Ask your health care provider if you are at risk. Ask your health care provider about whether you are at high risk for HIV. Your health care provider  may recommend a prescription medicine to help prevent HIV infection. If you choose to take medicine to prevent HIV, you should first get tested for HIV. You should then be tested every 3 months for as long as you are taking the medicine. Follow these instructions at home: Alcohol use Do not drink alcohol if your health care provider tells you not to drink. If you drink alcohol: Limit how much you have to 0-2 drinks a day. Know how much alcohol is in your drink. In the [place] one drink equals one 12 oz bottle of beer (355 mL), one 5 oz glass of wine (148 mL), or one 1 oz glass of hard liquor (44 mL). Lifestyle Do not use any products that contain nicotine or tobacco. These products include cigarettes, chewing tobacco, and vaping devices, such as e-cigarettes. If you need help quitting, ask your health care provider. Do not use street drugs. Do not share needles. Ask your health care provider for help if you need support or information about quitting drugs. General instructions Schedule regular health, dental, and eye exams. Stay current with your vaccines. Tell your health care provider if: You often feel depressed. You have ever been abused or do not feel safe at home. Summary Adopting a healthy lifestyle and getting preventive care are important in promoting health and wellness. Follow your health care provider's instructions about healthy diet, exercising, and getting tested or screened for diseases. Follow your health care provider's instructions on monitoring your cholesterol and blood pressure. This information is not intended to replace advice given to you by your health care provider. Make sure you discuss any questions you have with your health care provider. Document Revised: 03/02/2021 Document Reviewed: 03/02/2021 Elsevier Patient Education  2023 [employer] Inc.  

## 2022-11-23 MED FILL — Iron Sucrose Inj 20 MG/ML (Fe Equiv): INTRAVENOUS | Qty: 10 | Status: AC

## 2022-11-24 ENCOUNTER — Inpatient Hospital Stay: Payer: Medicare Other

## 2022-11-24 ENCOUNTER — Other Ambulatory Visit: Payer: Self-pay

## 2022-11-24 ENCOUNTER — Encounter: Payer: Self-pay | Admitting: Internal Medicine

## 2022-11-24 ENCOUNTER — Inpatient Hospital Stay
Admission: EM | Admit: 2022-11-24 | Discharge: 2022-11-26 | DRG: 811 | Disposition: A | Discharge status: Home Health | Payer: Medicare Other | Source: Ambulatory Visit | Attending: Internal Medicine | Admitting: Internal Medicine

## 2022-11-24 DIAGNOSIS — I509 Heart failure, unspecified: Secondary | ICD-10-CM | POA: Diagnosis not present

## 2022-11-24 DIAGNOSIS — D62 Acute posthemorrhagic anemia: Secondary | ICD-10-CM | POA: Diagnosis not present

## 2022-11-24 DIAGNOSIS — G894 Chronic pain syndrome: Secondary | ICD-10-CM | POA: Diagnosis not present

## 2022-11-24 DIAGNOSIS — Z79899 Other long term (current) drug therapy: Secondary | ICD-10-CM | POA: Diagnosis not present

## 2022-11-24 DIAGNOSIS — E1142 Type 2 diabetes mellitus with diabetic polyneuropathy: Secondary | ICD-10-CM | POA: Diagnosis present

## 2022-11-24 DIAGNOSIS — J9611 Chronic respiratory failure with hypoxia: Secondary | ICD-10-CM | POA: Diagnosis present

## 2022-11-24 DIAGNOSIS — D500 Iron deficiency anemia secondary to blood loss (chronic): Secondary | ICD-10-CM | POA: Diagnosis present

## 2022-11-24 DIAGNOSIS — Z888 Allergy status to other drugs, medicaments and biological substances status: Secondary | ICD-10-CM

## 2022-11-24 DIAGNOSIS — Z7984 Long term (current) use of oral hypoglycemic drugs: Secondary | ICD-10-CM

## 2022-11-24 DIAGNOSIS — J811 Chronic pulmonary edema: Secondary | ICD-10-CM | POA: Diagnosis not present

## 2022-11-24 DIAGNOSIS — D684 Acquired coagulation factor deficiency: Secondary | ICD-10-CM | POA: Diagnosis present

## 2022-11-24 DIAGNOSIS — E1122 Type 2 diabetes mellitus with diabetic chronic kidney disease: Secondary | ICD-10-CM | POA: Diagnosis present

## 2022-11-24 DIAGNOSIS — Z87891 Personal history of nicotine dependence: Secondary | ICD-10-CM

## 2022-11-24 DIAGNOSIS — I130 Hypertensive heart and chronic kidney disease with heart failure and stage 1 through stage 4 chronic kidney disease, or unspecified chronic kidney disease: Secondary | ICD-10-CM | POA: Diagnosis present

## 2022-11-24 DIAGNOSIS — Z800 Family history of malignant neoplasm of digestive organs: Secondary | ICD-10-CM | POA: Diagnosis not present

## 2022-11-24 DIAGNOSIS — K922 Gastrointestinal hemorrhage, unspecified: Secondary | ICD-10-CM | POA: Diagnosis not present

## 2022-11-24 DIAGNOSIS — D649 Anemia, unspecified: Secondary | ICD-10-CM | POA: Diagnosis not present

## 2022-11-24 DIAGNOSIS — F32A Depression, unspecified: Secondary | ICD-10-CM | POA: Diagnosis present

## 2022-11-24 DIAGNOSIS — Z833 Family history of diabetes mellitus: Secondary | ICD-10-CM

## 2022-11-24 DIAGNOSIS — I2510 Atherosclerotic heart disease of native coronary artery without angina pectoris: Secondary | ICD-10-CM | POA: Diagnosis present

## 2022-11-24 DIAGNOSIS — J449 Chronic obstructive pulmonary disease, unspecified: Secondary | ICD-10-CM | POA: Diagnosis not present

## 2022-11-24 DIAGNOSIS — D631 Anemia in chronic kidney disease: Secondary | ICD-10-CM | POA: Diagnosis present

## 2022-11-24 DIAGNOSIS — I5033 Acute on chronic diastolic (congestive) heart failure: Secondary | ICD-10-CM | POA: Diagnosis present

## 2022-11-24 DIAGNOSIS — K219 Gastro-esophageal reflux disease without esophagitis: Secondary | ICD-10-CM | POA: Diagnosis present

## 2022-11-24 DIAGNOSIS — Z6829 Body mass index (BMI) 29.0-29.9, adult: Secondary | ICD-10-CM

## 2022-11-24 DIAGNOSIS — Z801 Family history of malignant neoplasm of trachea, bronchus and lung: Secondary | ICD-10-CM

## 2022-11-24 DIAGNOSIS — Q2730 Arteriovenous malformation, site unspecified: Secondary | ICD-10-CM

## 2022-11-24 DIAGNOSIS — I270 Primary pulmonary hypertension: Secondary | ICD-10-CM | POA: Diagnosis present

## 2022-11-24 DIAGNOSIS — Z8249 Family history of ischemic heart disease and other diseases of the circulatory system: Secondary | ICD-10-CM

## 2022-11-24 DIAGNOSIS — K7460 Unspecified cirrhosis of liver: Secondary | ICD-10-CM | POA: Diagnosis present

## 2022-11-24 DIAGNOSIS — E785 Hyperlipidemia, unspecified: Secondary | ICD-10-CM | POA: Diagnosis present

## 2022-11-24 DIAGNOSIS — D509 Iron deficiency anemia, unspecified: Secondary | ICD-10-CM | POA: Diagnosis present

## 2022-11-24 DIAGNOSIS — N1831 Chronic kidney disease, stage 3a: Secondary | ICD-10-CM | POA: Diagnosis present

## 2022-11-24 DIAGNOSIS — Z9981 Dependence on supplemental oxygen: Secondary | ICD-10-CM | POA: Diagnosis not present

## 2022-11-24 DIAGNOSIS — G35 Multiple sclerosis: Secondary | ICD-10-CM | POA: Diagnosis not present

## 2022-11-24 DIAGNOSIS — K5521 Angiodysplasia of colon with hemorrhage: Secondary | ICD-10-CM | POA: Diagnosis present

## 2022-11-24 DIAGNOSIS — Z794 Long term (current) use of insulin: Secondary | ICD-10-CM

## 2022-11-24 DIAGNOSIS — Z825 Family history of asthma and other chronic lower respiratory diseases: Secondary | ICD-10-CM

## 2022-11-24 DIAGNOSIS — Z9049 Acquired absence of other specified parts of digestive tract: Secondary | ICD-10-CM

## 2022-11-24 DIAGNOSIS — Z7982 Long term (current) use of aspirin: Secondary | ICD-10-CM

## 2022-11-24 DIAGNOSIS — J9621 Acute and chronic respiratory failure with hypoxia: Secondary | ICD-10-CM | POA: Diagnosis present

## 2022-11-24 LAB — CBC
HCT: 21.9 % — ABNORMAL LOW (ref 39.0–52.0)
Hemoglobin: 6.2 g/dL — ABNORMAL LOW (ref 13.0–17.0)
MCH: 25.3 pg — ABNORMAL LOW (ref 26.0–34.0)
MCHC: 28.3 g/dL — ABNORMAL LOW (ref 30.0–36.0)
MCV: 89.4 fL (ref 80.0–100.0)
Platelets: 385 10*3/uL (ref 150–400)
RBC: 2.45 MIL/uL — ABNORMAL LOW (ref 4.22–5.81)
RDW: 19.3 % — ABNORMAL HIGH (ref 11.5–15.5)
WBC: 6.8 10*3/uL (ref 4.0–10.5)
nRBC: 0 % (ref 0.0–0.2)

## 2022-11-24 LAB — PREPARE RBC (CROSSMATCH)

## 2022-11-24 LAB — CBC WITH DIFFERENTIAL/PLATELET
Abs Immature Granulocytes: 0.1 10*3/uL — ABNORMAL HIGH (ref 0.00–0.07)
Basophils Absolute: 0.1 10*3/uL (ref 0.0–0.1)
Basophils Relative: 1 %
Eosinophils Absolute: 0.5 10*3/uL (ref 0.0–0.5)
Eosinophils Relative: 7 %
HCT: 21.2 % — ABNORMAL LOW (ref 39.0–52.0)
Hemoglobin: 5.9 g/dL — ABNORMAL LOW (ref 13.0–17.0)
Immature Granulocytes: 2 %
Lymphocytes Relative: 10 %
Lymphs Abs: 0.7 10*3/uL (ref 0.7–4.0)
MCH: 24.8 pg — ABNORMAL LOW (ref 26.0–34.0)
MCHC: 27.8 g/dL — ABNORMAL LOW (ref 30.0–36.0)
MCV: 89.1 fL (ref 80.0–100.0)
Monocytes Absolute: 0.6 10*3/uL (ref 0.1–1.0)
Monocytes Relative: 9 %
Neutro Abs: 4.6 10*3/uL (ref 1.7–7.7)
Neutrophils Relative %: 71 %
Platelets: 359 10*3/uL (ref 150–400)
RBC: 2.38 MIL/uL — ABNORMAL LOW (ref 4.22–5.81)
RDW: 19.6 % — ABNORMAL HIGH (ref 11.5–15.5)
WBC: 6.5 10*3/uL (ref 4.0–10.5)
nRBC: 0 % (ref 0.0–0.2)

## 2022-11-24 LAB — BASIC METABOLIC PANEL
Anion gap: 8 (ref 5–15)
BUN: 35 mg/dL — ABNORMAL HIGH (ref 8–23)
CO2: 25 mmol/L (ref 22–32)
Calcium: 8.3 mg/dL — ABNORMAL LOW (ref 8.9–10.3)
Chloride: 108 mmol/L (ref 98–111)
Creatinine, Ser: 1.22 mg/dL (ref 0.61–1.24)
GFR, Estimated: >60 mL/min (ref >60)
Glucose, Bld: 102 mg/dL — ABNORMAL HIGH (ref 70–99)
Potassium: 4.2 mmol/L (ref 3.5–5.1)
Sodium: 141 mmol/L (ref 135–145)

## 2022-11-24 LAB — RETICULOCYTES
Immature Retic Fract: 34.8 % — ABNORMAL HIGH (ref 2.3–15.9)
RBC.: 2.44 MIL/uL — ABNORMAL LOW (ref 4.22–5.81)
Retic Count, Absolute: 92.5 10*3/uL (ref 19.0–186.0)
Retic Ct Pct: 3.8 % — ABNORMAL HIGH (ref 0.4–3.1)

## 2022-11-24 LAB — IRON AND TIBC
Iron: 16 ug/dL — ABNORMAL LOW (ref 45–182)
Saturation Ratios: 5 % — ABNORMAL LOW (ref 17.9–39.5)
TIBC: 325 ug/dL (ref 250–450)
UIBC: 309 ug/dL

## 2022-11-24 LAB — FERRITIN: Ferritin: 26 ng/mL (ref 24–336)

## 2022-11-24 LAB — PROTIME-INR
INR: 1.3 — ABNORMAL HIGH (ref 0.8–1.2)
Prothrombin Time: 16.3 seconds — ABNORMAL HIGH (ref 11.4–15.2)

## 2022-11-24 LAB — RETIC PANEL
Immature Retic Fract: 32.8 % — ABNORMAL HIGH (ref 2.3–15.9)
RBC.: 2.37 MIL/uL — ABNORMAL LOW (ref 4.22–5.81)
Retic Count, Absolute: 90.8 10*3/uL (ref 19.0–186.0)
Retic Ct Pct: 3.8 % — ABNORMAL HIGH (ref 0.4–3.1)
Reticulocyte Hemoglobin: 18.8 pg — ABNORMAL LOW (ref >27.9)

## 2022-11-24 LAB — HEMOGLOBIN AND HEMATOCRIT, BLOOD
HCT: 22.6 % — ABNORMAL LOW (ref 39.0–52.0)
Hemoglobin: 6.8 g/dL — ABNORMAL LOW (ref 13.0–17.0)

## 2022-11-24 LAB — GLUCOSE, CAPILLARY: Glucose-Capillary: 145 mg/dL — ABNORMAL HIGH (ref 70–99)

## 2022-11-24 MED ORDER — INSULIN GLARGINE-YFGN 100 UNIT/ML ~~LOC~~ SOLN
18.0000 [IU] | Freq: Every day | SUBCUTANEOUS | Status: DC
  Administered 2022-11-25 (×2): 18 [IU] via SUBCUTANEOUS
  Filled 2022-11-24 (×3): qty 0.18

## 2022-11-24 MED ORDER — INSULIN ASPART 100 UNIT/ML IJ SOLN
0.0000 [IU] | Freq: Three times a day (TID) | INTRAMUSCULAR | Status: DC
  Administered 2022-11-25: 2 [IU] via SUBCUTANEOUS
  Filled 2022-11-24: qty 1

## 2022-11-24 MED ORDER — MUPIROCIN 2 % EX OINT
1.0000 | TOPICAL_OINTMENT | Freq: Two times a day (BID) | CUTANEOUS | Status: DC
  Administered 2022-11-25: 1 via TOPICAL
  Filled 2022-11-24 (×2): qty 22

## 2022-11-24 MED ORDER — PHYTONADIONE 5 MG PO TABS
5.0000 mg | ORAL_TABLET | Freq: Once | ORAL | Status: AC
  Administered 2022-11-24: 5 mg via ORAL
  Filled 2022-11-24: qty 1

## 2022-11-24 MED ORDER — LORATADINE 10 MG PO TABS
10.0000 mg | ORAL_TABLET | Freq: Every day | ORAL | Status: DC
  Administered 2022-11-25 – 2022-11-26 (×2): 10 mg via ORAL
  Filled 2022-11-24 (×2): qty 1

## 2022-11-24 MED ORDER — SERTRALINE HCL 50 MG PO TABS
25.0000 mg | ORAL_TABLET | Freq: Every day | ORAL | Status: DC
  Administered 2022-11-25 – 2022-11-26 (×2): 25 mg via ORAL
  Filled 2022-11-24 (×2): qty 1

## 2022-11-24 MED ORDER — FERROUS SULFATE 325 (65 FE) MG PO TABS
325.0000 mg | ORAL_TABLET | Freq: Every day | ORAL | Status: DC
  Administered 2022-11-25 – 2022-11-26 (×2): 325 mg via ORAL
  Filled 2022-11-24 (×2): qty 1

## 2022-11-24 MED ORDER — GEMFIBROZIL 600 MG PO TABS
600.0000 mg | ORAL_TABLET | Freq: Two times a day (BID) | ORAL | Status: DC
  Administered 2022-11-24 – 2022-11-26 (×4): 600 mg via ORAL
  Filled 2022-11-24 (×4): qty 1

## 2022-11-24 MED ORDER — SODIUM CHLORIDE 0.9 % IV SOLN: Freq: Once | INTRAVENOUS | Status: AC

## 2022-11-24 MED ORDER — PANTOPRAZOLE SODIUM 40 MG PO TBEC
40.0000 mg | DELAYED_RELEASE_TABLET | Freq: Every day | ORAL | Status: DC
  Administered 2022-11-25 – 2022-11-26 (×2): 40 mg via ORAL
  Filled 2022-11-24 (×2): qty 1

## 2022-11-24 MED ORDER — VITAMIN B-12 1000 MCG PO TABS
1000.0000 ug | ORAL_TABLET | Freq: Every day | ORAL | Status: DC
  Administered 2022-11-25 – 2022-11-26 (×2): 1000 ug via ORAL
  Filled 2022-11-24 (×2): qty 1

## 2022-11-24 MED ORDER — ASPIRIN 81 MG PO TBEC
81.0000 mg | DELAYED_RELEASE_TABLET | Freq: Every day | ORAL | Status: DC
  Administered 2022-11-25 – 2022-11-26 (×2): 81 mg via ORAL
  Filled 2022-11-24 (×2): qty 1

## 2022-11-24 MED ORDER — ALBUTEROL SULFATE (2.5 MG/3ML) 0.083% IN NEBU: 3.0000 mL | INHALATION_SOLUTION | Freq: Four times a day (QID) | RESPIRATORY_TRACT | Status: DC | PRN

## 2022-11-24 MED ORDER — TRAMADOL HCL 50 MG PO TABS
50.0000 mg | ORAL_TABLET | Freq: Two times a day (BID) | ORAL | Status: DC | PRN
  Administered 2022-11-24: 50 mg via ORAL
  Filled 2022-11-24: qty 1

## 2022-11-24 MED ORDER — SODIUM CHLORIDE 0.9 % IV SOLN: 10.0000 mL/h | Freq: Once | INTRAVENOUS | Status: DC

## 2022-11-24 MED ORDER — SODIUM CHLORIDE 0.9% IV SOLUTION: Freq: Once | INTRAVENOUS | Status: AC

## 2022-11-24 MED ORDER — ACETAMINOPHEN 500 MG PO TABS: 500.0000 mg | ORAL_TABLET | Freq: Four times a day (QID) | ORAL | Status: DC | PRN

## 2022-11-24 MED ORDER — SODIUM CHLORIDE 0.9 % IV SOLN
500.0000 mg | Freq: Once | INTRAVENOUS | Status: AC
  Administered 2022-11-25: 500 mg via INTRAVENOUS
  Filled 2022-11-24: qty 25

## 2022-11-24 MED ORDER — SODIUM BICARBONATE 650 MG PO TABS
650.0000 mg | ORAL_TABLET | Freq: Two times a day (BID) | ORAL | Status: DC
  Administered 2022-11-24 – 2022-11-26 (×4): 650 mg via ORAL
  Filled 2022-11-24 (×5): qty 1

## 2022-11-24 MED ORDER — FUROSEMIDE 10 MG/ML IJ SOLN
40.0000 mg | Freq: Two times a day (BID) | INTRAMUSCULAR | Status: DC
  Administered 2022-11-24 – 2022-11-26 (×4): 40 mg via INTRAVENOUS
  Filled 2022-11-24 (×4): qty 4

## 2022-11-24 MED ORDER — SIMVASTATIN 20 MG PO TABS
40.0000 mg | ORAL_TABLET | Freq: Every evening | ORAL | Status: DC
  Administered 2022-11-24 – 2022-11-25 (×2): 40 mg via ORAL
  Filled 2022-11-24 (×2): qty 2

## 2022-11-24 MED ORDER — PREGABALIN 75 MG PO CAPS
200.0000 mg | ORAL_CAPSULE | Freq: Two times a day (BID) | ORAL | Status: DC
  Administered 2022-11-24 – 2022-11-26 (×4): 200 mg via ORAL
  Filled 2022-11-24 (×4): qty 1

## 2022-11-24 NOTE — Progress Notes (Signed)
NSAID duration only 5% indicating severe muscle absorption of iron.  Will give 1 dose of 500 mg Ven[place] for x 1.  INR=1.3, one dose of VitK given.

## 2022-11-24 NOTE — H&P (Addendum)
History and Physical    Jeremy [name] JEH:[id number] DOB: 1951/10/15 DOA: 11/24/2022  PCP: Juline Patch, MD (Confirm with patient/family/NH records and if not entered, this has to be entered at [facility] point of entry) Patient coming from: Home  I have personally briefly reviewed patient's old medical records in [place]  Chief Complaint: feeling tired, SOB  HPI: Jeremy [name] is a 72 y.o. male with medical history significant of MS relapsing remitting on interferon beta-1B treatment, IDDM, COPD with chronic hypoxic respiratory failure on 2 L continuously, moderate pulmonary hypertension, chronic iron deficiency anemia secondary to AVM bleeding, chronic pain syndrome on tramadol, HTN, CKD stage IIIa, sent from hematologist office for severe anemia with hemoglobin 5.9.  Was recently hospitalized about 1 month ago for similar presentation, on discharge patient received a total of 3 unit PRBC and hemoglobin improved to 8.9.  After going home, patient contacted [facility] and set up appointment on February 14.  Patient reported that he has been on iron supplement and his stools looked dark however on the days 6 he skipped iron supplement he saw brown soft stool.  Denies any abdominal pain no nauseous vomiting.  Last 2 weeks increasing his been feeling fatigue, increasing of exertional dyspnea ED Course: Vital signs stable, none tachycardia nonhypotensive.  Saturation 95% on 2 L. Hb=6.2.  PRBC x2 ordered.  Review of Systems: As per HPI otherwise 14 point review of systems negative.    Past Medical History:  Diagnosis Date   Chronic pain    Depression    Diabetes ([place])    GERD (gastroesophageal reflux disease)    Hyperlipemia    Hypertension    MS (multiple sclerosis) ([place])     Past Surgical History:  Procedure Laterality Date   COLECTOMY  06-2008   COLONOSCOPY  2015   normal   COLONOSCOPY WITH PROPOFOL N/A 09/10/2021   Procedure: COLONOSCOPY WITH PROPOFOL;  Surgeon: [name],  MD;  Location: [facility] ENDOSCOPY;  Service: Endoscopy;  Laterality: N/A;   ESOPHAGOGASTRODUODENOSCOPY (EGD) WITH PROPOFOL N/A 09/10/2021   Procedure: ESOPHAGOGASTRODUODENOSCOPY (EGD) WITH PROPOFOL;  Surgeon: [name], MD;  Location: [facility] ENDOSCOPY;  Service: Endoscopy;  Laterality: N/A;   GIVENS CAPSULE STUDY N/A 09/25/2021   Procedure: GIVENS CAPSULE STUDY;  Surgeon: Jonathon Bellows, MD;  Location: [facility] ENDOSCOPY;  Service: Gastroenterology;  Laterality: N/A;     reports that he quit smoking about 16 years ago. His smoking use included cigarettes. He has a 45.00 pack-year smoking history. He has never used smokeless tobacco. He reports that he does not currently use alcohol. He reports that he does not use drugs.  Allergies  Allergen Reactions   Betadine [Povidone Iodine]     Family History  Problem Relation Age of Onset   Lung cancer Mother    Heart attack Father    Heart attack Brother    COPD Brother    Diabetes Brother    Esophageal cancer Nephew      Prior to Admission medications   Medication Sig Start Date End Date Taking? Authorizing Provider  albuterol (VENTOLIN HFA) 108 (90 Base) MCG/ACT inhaler Inhale 2 puffs into the lungs in the morning, at noon, in the evening, and at bedtime.    [provider]  aspirin 81 MG tablet Take 81 mg by mouth daily.    [provider]  B-D INSULIN SYRINGE 1CC/25GX1" 25G X 1" 1 ML MISC USE AS DIRECTED 06/22/21   Juline Patch, MD  cyanocobalamin (VITAMIN B12) 500  MCG tablet Take 1,000 mcg by mouth daily.    [provider]  ferrous sulfate 325 (65 FE) MG tablet Take 325 mg by mouth daily with breakfast.    [provider]  gemfibrozil (LOPID) 600 MG tablet TAKE 1 TABLET BY MOUTH TWICE  DAILY BEFORE MEALS Patient taking differently: Take 600 mg by mouth 2 (two) times daily before a meal. TAKE 1 TABLET BY MOUTH TWICE  DAILY BEFORE MEALS 07/02/22   Juline Patch, MD  insulin glargine (LANTUS) 100 UNIT/ML  Solostar Pen Inject 20 Units into the skin at bedtime. Patient taking differently: Inject 26 Units into the skin at bedtime. 08/19/22   Enzo Bi, MD  Interferon Beta-1b (BETASERON) 0.3 MG KIT injection Inject subcutaneously 1 syringe every other day. 08/25/22   Marcial Pacas, MD  loratadine (CLARITIN) 10 MG tablet Take 1 tablet (10 mg total) by mouth daily. 07/02/22   Juline Patch, MD  metFORMIN (GLUCOPHAGE) 500 MG tablet Take 500 mg by mouth 2 (two) times daily.    [provider]  mupirocin ointment (BACTROBAN) 2 % Apply 1 Application topically 2 (two) times daily. Apply to effected area bid 07/02/22   Juline Patch, MD  omeprazole (PRILOSEC) 40 MG capsule TAKE 1 CAPSULE BY MOUTH DAILY Patient taking differently: Take 40 mg by mouth daily. TAKE 1 CAPSULE BY MOUTH DAILY 07/02/22   Juline Patch, MD  pregabalin (LYRICA) 200 MG capsule Take 1 capsule (200 mg total) by mouth 2 (two) times daily. 07/02/22   Juline Patch, MD  sertraline (ZOLOFT) 50 MG tablet Take 1/2 tablet daily Patient taking differently: Take 25 mg by mouth daily. Take 1/2 tablet daily 07/02/22   Juline Patch, MD  simvastatin (ZOCOR) 40 MG tablet Take 1 tablet (40 mg total) by mouth daily. 07/02/22   Juline Patch, MD  sodium bicarbonate 650 MG tablet Take 650 mg by mouth 2 (two) times daily.    [provider]  traMADol (ULTRAM) 50 MG tablet TAKE 1 TABLET BY MOUTH EVERY 6 HOURS AS NEEDED 11/09/22   Suzzanne Cloud, NP    Physical Exam: Vitals:   11/24/22 1347 11/24/22 1539 11/24/22 1600 11/24/22 1609  BP: (!) 132/42 (!) 152/55 (!) 152/55 (!) 144/62  Pulse: 68 74 74 82  Resp: '18 18 18 20  '$ Temp: 98 F (36.7 C) 97.7 F (36.5 C) 97.7 F (36.5 C) 97.7 F (36.5 C)  TempSrc:  Oral  Oral  SpO2: 92% 95% 95% 94%    Constitutional: NAD, calm, comfortable Vitals:   11/24/22 1347 11/24/22 1539 11/24/22 1600 11/24/22 1609  BP: (!) 132/42 (!) 152/55 (!) 152/55 (!) 144/62  Pulse: 68 74 74 82  Resp: '18 18 18 20   '$ Temp: 98 F (36.7 C) 97.7 F (36.5 C) 97.7 F (36.5 C) 97.7 F (36.5 C)  TempSrc:  Oral  Oral  SpO2: 92% 95% 95% 94%   Eyes: PERRL, lids and conjunctivae normal ENMT: Mucous membranes are moist. Posterior pharynx clear of any exudate or lesions.Normal dentition.  Neck: normal, supple, no masses, no thyromegaly Respiratory: clear to auscultation bilaterally, no wheezing, B/L fine crackles to the mid levels. Increasing respiratory effort. No accessory muscle use.  Cardiovascular: Regular rate and rhythm, no murmurs / rubs / gallops. 2+ extremity edema. 2+ pedal pulses. No carotid bruits.  Abdomen: no tenderness, no masses palpated. No hepatosplenomegaly. Bowel sounds positive.  Musculoskeletal: no clubbing / cyanosis. No joint deformity upper and lower extremities. Good  ROM, no contractures. Normal muscle tone.  Skin: no rashes, lesions, ulcers. No induration Neurologic: CN 2-12 grossly intact. Sensation intact, DTR normal. Strength 5/5 in all 4.  Psychiatric: Normal judgment and insight. Alert and oriented x 3. Normal mood.     Labs on Admission: I have personally reviewed following labs and imaging studies  CBC: Recent Labs  Lab 11/24/22 1235 11/24/22 1352  WBC 6.5 6.8  NEUTROABS 4.6  --   HGB 5.9* 6.2*  HCT 21.2* 21.9*  MCV 89.1 89.4  PLT 359 347   Basic Metabolic Panel: Recent Labs  Lab 11/24/22 1352  NA 141  K 4.2  CL 108  CO2 25  GLUCOSE 102*  BUN 35*  CREATININE 1.22  CALCIUM 8.3*   GFR: Estimated Creatinine Clearance: 65.6 mL/min (by C-G formula based on SCr of 1.22 mg/dL). Liver Function Tests: No results for input(s): "AST", "ALT", "ALKPHOS", "BILITOT", "PROT", "ALBUMIN" in the last 168 hours. No results for input(s): "LIPASE", "AMYLASE" in the last 168 hours. No results for input(s): "AMMONIA" in the last 168 hours. Coagulation Profile: No results for input(s): "INR", "PROTIME" in the last 168 hours. Cardiac Enzymes: No results for input(s):  "CKTOTAL", "CKMB", "CKMBINDEX", "TROPONINI" in the last 168 hours. BNP (last 3 results) No results for input(s): "PROBNP" in the last 8760 hours. HbA1C: No results for input(s): "HGBA1C" in the last 72 hours. CBG: No results for input(s): "GLUCAP" in the last 168 hours. Lipid Profile: No results for input(s): "CHOL", "HDL", "LDLCALC", "TRIG", "CHOLHDL", "LDLDIRECT" in the last 72 hours. Thyroid Function Tests: No results for input(s): "TSH", "T4TOTAL", "FREET4", "T3FREE", "THYROIDAB" in the last 72 hours. Anemia Panel: Recent Labs    11/24/22 1235  RETICCTPCT 3.8*   Urine analysis:    Component Value Date/Time   COLORURINE YELLOW (A) 08/18/2022 1237   APPEARANCEUR CLEAR (A) 08/18/2022 1237   APPEARANCEUR Hazy 08/27/2013 1157   LABSPEC 1.017 08/18/2022 1237   LABSPEC 1.013 08/27/2013 1157   PHURINE 5.0 08/18/2022 1237   GLUCOSEU NEGATIVE 08/18/2022 1237   GLUCOSEU Negative 08/27/2013 1157   HGBUR MODERATE (A) 08/18/2022 1237   BILIRUBINUR NEGATIVE 08/18/2022 1237   BILIRUBINUR Negative 08/27/2013 [place] 08/18/2022 1237   PROTEINUR NEGATIVE 08/18/2022 1237   NITRITE NEGATIVE 08/18/2022 [place] 08/18/2022 1237   LEUKOCYTESUR Negative 08/27/2013 1157    Radiological Exams on Admission: No results found.  EKG: Independently reviewed. Sinus, no acute ST changes.  Assessment/Plan Principal Problem:   GI bleed Active Problems:   Acute on chronic respiratory failure with hypoxia (HCC)   Iron deficiency anemia   Symptomatic anemia   Arteriovenous malformation (AVM)  (please populate well all problems here in Problem List. (For example, if patient is on BP meds at home and you resume or decide to hold them, it is a problem that needs to be her. Same for CAD, COPD, HLD and so on)  Acute on chronic iron deficiency anemia secondary to chronic GI bleed -Appears to be a chronic GI bleed issue from small bowel polyps vs AVM first shown on a  capsule study in 2022, with hemoglobin slowly trending down in the last 4 weeks 8.9> 7.6> 6.2.  No overt active bleeding and his symptoms has progressed gradually -2 units of PRBCs ordered in the ED, recheck H&H tonight and tomorrow morning -Consult with on-call GI Dr. Alice Reichert who will see the patient on consult. -Check iron level -Hold off CTA or bleeding scan as there is  no signs of life threatening bleeding.  Acute on chronic HFpEF decompensation -Significant fluid overload, likely secondary to worsening of his anemia -Transfusion as above -Start IV Lasix 40 mg twice daily  IDDM -Continue Lantus, add sliding scale  COPD -Stable  Multiple sclerosis -Denies any symptoms of flareup -Continue to follow with neurology for Interferon infusion outpatient.  CKD stage IIIa -Cre 1.2 today, clinically patient is fluid overload. On IV lasx, monitor BMP and volume status  Cirrhosis with coagulopathy -Chronic, as early as 2014, abd U/S showed cirrhosis changes which confirmed several times on CT abd, unknown etiology. -Check INR, will give PO VItK -No varices found on last year's EGD. -Will need home lasix   DVT prophylaxis: SCD Code Status: Full code Family Communication: None at bedside Disposition Plan: Expect less than 2 midnight hospital stay Consults called: GI Admission status: Tele admit   Lequita Halt MD [facility] Pager [phone]  11/24/2022, 4:14 PM

## 2022-11-24 NOTE — Consult Note (Signed)
GI Inpatient Consult Note  Reason for Consult: IDA 2/2 chronic GI blood loss, symptomatic anemia    Attending Requesting Consult: Dr. Wynetta Fines, MD  History of Present Illness: Jeremy [name] is a 72 y.o. male seen for evaluation of symptomatic anemia and IDA 2/2 chronic GI blood loss at the request of admitting hospitalist - Dr. Wynetta Fines. Patient has a PMH of HTN, HLD, T2DM, CKD Stage III, AOCD, multiple sclerosis, COPD on chronic O2 supplementation, chronic pain syndrome, and morbid obesity. He presented to the [facility] ED from his hematologist's office for chief complaint of symptomatic anemia with hemoglobin 5.9. Upon presentation to the ED, all vital signs stable with O2 sats 92% on 2L O2. Labs here significant for hemoglobin 6.2, MCV 89, BUN 35, serum creatinine 1.22, and retic ct 3.8%. He was ordered to have 2 units pRBC transfusion and GI consulted for further evaluation and management.   Patient seen and examined resting comfortably in ED room. His wife, [name], is also present in room. He denies any other complaints other than weakness. He has been increasingly more fatigued and winded over the past few weeks which has been the case over the past few months. He denies any overt hematochezia or melena. He denies any hematemesis, epistaxis, or other signs of bleeding. He denies any abdominal pain or abdominal cramping. He denies any UGI symptoms such as nausea, vomiting, esophageal dysphagia, odynophagia, early satiety, hoarseness, or epigastric abdominal pain. He denies any loss of appetite or unintentional weight loss. He and wife have been frustrated over the past few weeks due to not being able to get an appointment at [facility]. His primary GI physician, Dr. Allen Norris, has been trying to get him in at [facility] for DBE due to chronic GI blood loss from likely AVMs. He had EGD and colonoscopy performed for work-up of IDA by Dr. Allen Norris 08/2021 which were negative for any sources of anemia.  He have VCE performed 09/2021 which commented on single non-bleeding AVM in distal small bowel and area of erythema in colon obscured by stool. Colon finding likely not specific given unremarkable recent colonoscopy. He has been following at [facility] for IV iron infusions as needed since these procedures. He had ED presentation one month ago for similar presentation where he required transfusion.     Summary of GI Procedures:  EGD 09/10/2021 - irregular Z-line, small hiatal hernia, gastritis, one 10 mm gastric polyp in antrum  CSY 09/10/2021 - patent end-to-end colo-colonic anastomosis in transverse colon, otherwise normal examined colon  VCE 09/25/2021    Past Medical History:  Past Medical History:  Diagnosis Date   Chronic pain    Depression    Diabetes ([place])    GERD (gastroesophageal reflux disease)    Hyperlipemia    Hypertension    MS (multiple sclerosis) ([place])     Problem List: Patient Active Problem List   Diagnosis Date Noted   GI bleed 11/24/2022   Arteriovenous malformation (AVM) 10/21/2022   Weakness 08/18/2022   AKI (acute kidney injury) ([place]) 08/18/2022   Severe sepsis ([place]) 02/04/2022   Pneumonia 02/03/2022   Acute on chronic respiratory failure with hypoxia ([place]) 02/03/2022   Hyperkalemia 02/03/2022   COPD (chronic obstructive pulmonary disease) ([place]) 02/03/2022   SIRS (systemic inflammatory response syndrome), possible sepsis ([place]) 02/03/2022   Anemia due to blood loss 09/24/2021   Gastric polyp    Gastritis without bleeding    Symptomatic anemia 06/19/2021   Iron deficiency anemia  due to chronic blood loss 06/19/2021   Gait abnormality 05/07/2021   Stage 3a chronic kidney disease ([place]) 05/20/2020   DM type 2 with diabetic peripheral neuropathy ([place]) 05/06/2020   Idiopathic peripheral neuropathy 01/15/2020   Generalized edema 01/15/2020   Primary pulmonary hypertension ([place]) 10/28/2019   Therapeutic drug monitoring 03/23/2018   Mild episode of  recurrent major depressive disorder ([place]) 09/26/2017   Ventricular ectopic beats 09/26/2017   Type 2 diabetes mellitus with diabetic polyneuropathy, with long-term current use of insulin ([place]) 08/12/2017   Hyperlipidemia due to type 2 diabetes mellitus ([place]) 08/12/2017   B12 deficiency 12/14/2016   Low back pain 03/10/2016   Lumbosacral disc disease 01/01/2016   Neuropathy associated with endocrine disorder ([place]) 11/19/2015   Iron deficiency anemia 06/03/2015   Essential hypertension 06/03/2015   Hyperlipidemia 06/03/2015   Depression 06/03/2015   Gastroesophageal reflux disease without esophagitis 06/03/2015   Edema extremities 06/03/2015   Multiple sclerosis ([place]) 07/26/2013   Abnormality of gait 07/26/2013   Morbid obesity ([place]) 07/26/2013    Past Surgical History: Past Surgical History:  Procedure Laterality Date   COLECTOMY  06-2008   COLONOSCOPY  2015   normal   COLONOSCOPY WITH PROPOFOL N/A 09/10/2021   Procedure: COLONOSCOPY WITH PROPOFOL;  Surgeon: [name], MD;  Location: [facility] ENDOSCOPY;  Service: Endoscopy;  Laterality: N/A;   ESOPHAGOGASTRODUODENOSCOPY (EGD) WITH PROPOFOL N/A 09/10/2021   Procedure: ESOPHAGOGASTRODUODENOSCOPY (EGD) WITH PROPOFOL;  Surgeon: [name], MD;  Location: [facility] ENDOSCOPY;  Service: Endoscopy;  Laterality: N/A;   GIVENS CAPSULE STUDY N/A 09/25/2021   Procedure: GIVENS CAPSULE STUDY;  Surgeon: Jonathon Bellows, MD;  Location: [facility] ENDOSCOPY;  Service: Gastroenterology;  Laterality: N/A;    Allergies: Allergies  Allergen Reactions   Betadine [Povidone Iodine]     Home Medications: (Not in a hospital admission)  Home medication reconciliation was completed with the patient.   Scheduled Inpatient Medications:    aspirin  81 mg Oral Daily   cyanocobalamin  1,000 mcg Oral Daily   [START ON 11/25/2022] ferrous sulfate  325 mg Oral Q breakfast   furosemide  40 mg Intravenous BID   gemfibrozil  600 mg Oral BID AC   insulin aspart  0-9 Units  Subcutaneous TID WC   insulin glargine-yfgn  18 Units Subcutaneous Daily   loratadine  10 mg Oral Daily   pantoprazole  40 mg Oral Daily   pregabalin  200 mg Oral BID   sertraline  25 mg Oral Daily   simvastatin  40 mg Oral Daily   sodium bicarbonate  650 mg Oral BID    Continuous Inpatient Infusions:    sodium chloride      PRN Inpatient Medications:  albuterol, traMADol  Family History: family history includes COPD in his brother; Diabetes in his brother; Esophageal cancer in his nephew; Heart attack in his brother and father; Lung cancer in his mother.  The patient's family history is negative for inflammatory bowel disorders, GI malignancy, or solid organ transplantation.  Social History:   reports that he quit smoking about 16 years ago. His smoking use included cigarettes. He has a 45.00 pack-year smoking history. He has never used smokeless tobacco. He reports that he does not currently use alcohol. He reports that he does not use drugs. The patient denies ETOH, tobacco, or drug use.   Review of Systems: Constitutional: Weight is stable.  Eyes: No changes in vision. ENT: No oral lesions, sore throat.  GI: see HPI.  Heme/Lymph: No easy bruising.  CV: No chest pain.  GU: No hematuria.  Integumentary: No rashes.  Neuro: No headaches.  Psych: No depression/anxiety.  Endocrine: No heat/cold intolerance.  Allergic/Immunologic: No urticaria.  Resp: No cough, SOB.  Musculoskeletal: No joint swelling.    Physical Examination: BP (!) 144/62 (BP Location: Left Arm)   Pulse 82   Temp 97.7 F (36.5 C) (Oral)   Resp 20   SpO2 94%  Gen: NAD, alert and oriented x 4 HEENT: PEERLA, EOMI, Neck: supple, no JVD or thyromegaly Chest: CTA bilaterally, no wheezes, crackles, or other adventitious sounds CV: RRR, no m/g/c/r Abd: soft, NT, ND, +BS in all four quadrants; no HSM, guarding, ridigity, or rebound tenderness Ext: no edema, well perfused with 2+ pulses, Skin: no rash or  lesions noted Lymph: no LAD  Data: Lab Results  Component Value Date   WBC 6.8 11/24/2022   HGB 6.2 (L) 11/24/2022   HCT 21.9 (L) 11/24/2022   MCV 89.4 11/24/2022   PLT 385 11/24/2022   Recent Labs  Lab 11/24/22 1235 11/24/22 1352  HGB 5.9* 6.2*   Lab Results  Component Value Date   NA 141 11/24/2022   K 4.2 11/24/2022   CL 108 11/24/2022   CO2 25 11/24/2022   BUN 35 (H) 11/24/2022   CREATININE 1.22 11/24/2022   GLU 94 03/30/2021   Lab Results  Component Value Date   ALT 10 08/18/2022   AST 20 08/18/2022   ALKPHOS 61 08/18/2022   BILITOT 0.5 08/18/2022   No results for input(s): "APTT", "INR", "PTT" in the last 168 hours.  Assessment/Plan:  72 y/o Caucasian male with a PMH of HTN, HLD, T2DM, CKD Stage III, AOCD, multiple sclerosis, COPD on chronic O2 supplementation, chronic pain syndrome, and morbid obesity presented to the [facility] ED this afternoon from his hematologist's office for chief complaint of symptomatic anemia   Iron-deficiency anemia 2/2 chronic GI blood loss  Symptomatic anemia  Small bowel AVM - one non-bleeding distal AVM seen on VCE 09/2021  Cirrhosis - radiographic evidence of cirrhosis based on CT 07/2022, suspect 2/2 MALD. No evidence of decompensated cirrhosis and no hx of esophageal varices, GAVE, or portal hypertensive gastropathy   Acute on chronic CHF exacerbation   Anemia of chronic disease  COPD on chronic O2 supplementation  Multiple sclerosis   Chronic pain syndrome  Morbid obesity  Recommendations:  - Transfuse 2 units pRBCs as ordered - Maintain 2 large bore IVs for access - Continue to monitor serial H&H. Transfuse for Hgb goal >7.0.  - No signs of overt gastrointestinal blood loss - Continue to monitor for signs of GI bleeding - No plans for inpatient luminal evaluation at this time in light of no overt gastrointestinal bleeding. Suspect patient has slow chronic GI bleeding from small bowel AVMs based on history.  - He  will ultimately need DBE with Advanced End[facility] at [facility] or other tertiary center - Continue management of other medical comorbidities per primary team  - If there are signs of overt GI bleeding, can consider push enteroscopy versus NM GI bleeding scan or CTA  - Heart healthy diet for now - GI following along with you for now  Thank you for the consult. Please call with questions or concerns.  Geanie Kenning, PA-C [facility] Gastroenterology [phone]

## 2022-11-24 NOTE — ED Provider Notes (Signed)
[facility] Provider Note    Event Date/Time   First MD Initiated Contact with Patient 11/24/22 1500     (approximate)   History   abnormal labs   HPI  Jeremy [name] is a 72 y.o. male extensive past medical history including diabetes, MS, CKD stage III, iron deficiency anemia, AVM with chronic GI blood losses who presents to the ER for symptomatic anemia.  This is his third visit over the past 3 months for similar symptoms.  He has been on iron supplementation feeling increasingly weak over the past few weeks.  He denies any chest pain or pressure.  No nausea or vomiting.  No hematochezia.  No change in stool color or consistency.  He has a scheduled follow-up with [facility] for advanced EGD recommendation per GI here but that is not until the beginning of February.     Physical Exam   Triage Vital Signs: ED Triage Vitals  Enc Vitals Group     BP 11/24/22 1347 (!) 132/42     Pulse Rate 11/24/22 1347 68     Resp 11/24/22 1347 18     Temp 11/24/22 1347 98 F (36.7 C)     Temp src --      SpO2 11/24/22 1347 92 %     Weight --      Height --      Head Circumference --      Peak Flow --      Pain Score 11/24/22 1345 5     Pain Loc --      Pain Edu? --      Excl. in [place]? --     Most recent vital signs: Vitals:   11/24/22 1347  BP: (!) 132/42  Pulse: 68  Resp: 18  Temp: 98 F (36.7 C)  SpO2: 92%     Constitutional: Alert, chronically ill-appearing, pale Eyes: Conjunctivae are normal.  Head: Atraumatic. Nose: No congestion/rhinnorhea. Mouth/Throat: Mucous membranes are moist.   Neck: Painless ROM.  Cardiovascular:   Good peripheral circulation. Respiratory: Normal respiratory effort.  No retractions.  Gastrointestinal: Soft and nontender.  Musculoskeletal:  no deformity, trace bilateral lower extremity edema. Neurologic:  MAE spontaneously. No gross focal neurologic deficits are appreciated.  Skin:  Skin is warm, dry and intact. No rash  noted. Psychiatric: Mood and affect are normal. Speech and behavior are normal.    ED Results / Procedures / Treatments   Labs (all labs ordered are listed, but only abnormal results are displayed) Labs Reviewed  CBC - Abnormal; Notable for the following components:      Result Value   RBC 2.45 (*)    Hemoglobin 6.2 (*)    HCT 21.9 (*)    MCH 25.3 (*)    MCHC 28.3 (*)    RDW 19.3 (*)    All other components within normal limits  BASIC METABOLIC PANEL - Abnormal; Notable for the following components:   Glucose, Bld 102 (*)    BUN 35 (*)    Calcium 8.3 (*)    All other components within normal limits  TYPE AND SCREEN  PREPARE RBC (CROSSMATCH)     EKG     RADIOLOGY    PROCEDURES:  Critical Care performed: Yes, see critical care procedure note(s)  .Critical Care  Performed by: Merlyn Lot, MD Authorized by: Merlyn Lot, MD   Critical care provider statement:    Critical care time (minutes):  15   Critical care was necessary to  treat or prevent imminent or life-threatening deterioration of the following conditions:  Circulatory failure   Critical care was time spent personally by me on the following activities:  Ordering and performing treatments and interventions, ordering and review of laboratory studies, ordering and review of radiographic studies, pulse oximetry, re-evaluation of patient's condition, review of old charts, obtaining history from patient or surrogate, examination of patient, evaluation of patient's response to treatment, discussions with primary provider, discussions with consultants and development of treatment plan with patient or surrogate    MEDICATIONS ORDERED IN ED: Medications  0.9 %  sodium chloride infusion (has no administration in time range)     IMPRESSION / MDM / ASSESSMENT AND PLAN / ED COURSE  I reviewed the triage vital signs and the nursing notes.                              Differential diagnosis includes,  but is not limited to, IDA, Aocd, UGIB. Lgib, ckd,   Patient presenting to the ER for evaluation of symptoms as described above.  Based on symptoms, risk factors and considered above differential, this presenting complaint could reflect a potentially life-threatening illness therefore the patient will be placed on continuous pulse oximetry and telemetry for monitoring.  Laboratory evaluation will be sent to evaluate for the above complaints.  Patient with evidence of acute on chronic anemia now with symptomatic anemia with a hemoglobin of 6.  Similar levels last month and received 2 units.  He is already on iron supplementation.  Does not appear to be having evidence of active hemorrhage at this time but suspect intermittent or chronic slow blood loss from GI tract.  No indication for diagnostic imaging based on presentation.  Will order blood transfusion will consult hospitalist for admission for further medical management and observation.      FINAL CLINICAL IMPRESSION(S) / ED DIAGNOSES   Final diagnoses:  Acute on chronic anemia  Symptomatic anemia     Rx / DC Orders   ED Discharge Orders     None        Note:  This document was prepared using Dragon voice recognition software and may include unintentional dictation errors.    Merlyn Lot, MD 11/24/22 [phone]

## 2022-11-24 NOTE — ED Triage Notes (Addendum)
Pt comes with c/o abnormal labs. Pt states he went to cancer center for infusions and his Hgb was 5.9. Pt denies any bleeding or dark stools.   Pt wears 2L [place] chronically at home.

## 2022-11-24 NOTE — Progress Notes (Signed)
Pt HGB 5.9,  per Dr Tasia Catchings pt needs to go to ED. Family and pt updated and verbalized understanding.  Dr Tasia Catchings team to call report to ED

## 2022-11-25 DIAGNOSIS — K922 Gastrointestinal hemorrhage, unspecified: Secondary | ICD-10-CM

## 2022-11-25 LAB — HEMOGLOBIN AND HEMATOCRIT, BLOOD
HCT: 31 % — ABNORMAL LOW (ref 39.0–52.0)
Hemoglobin: 9.7 g/dL — ABNORMAL LOW (ref 13.0–17.0)

## 2022-11-25 LAB — CBC
HCT: 22.8 % — ABNORMAL LOW (ref 39.0–52.0)
Hemoglobin: 7.2 g/dL — ABNORMAL LOW (ref 13.0–17.0)
MCH: 27.1 pg (ref 26.0–34.0)
MCHC: 31.6 g/dL (ref 30.0–36.0)
MCV: 85.7 fL (ref 80.0–100.0)
Platelets: 312 10*3/uL (ref 150–400)
RBC: 2.66 MIL/uL — ABNORMAL LOW (ref 4.22–5.81)
RDW: 17.7 % — ABNORMAL HIGH (ref 11.5–15.5)
WBC: 4.6 10*3/uL (ref 4.0–10.5)
nRBC: 0 % (ref 0.0–0.2)

## 2022-11-25 LAB — BASIC METABOLIC PANEL
Anion gap: 9 (ref 5–15)
BUN: 33 mg/dL — ABNORMAL HIGH (ref 8–23)
CO2: 24 mmol/L (ref 22–32)
Calcium: 8 mg/dL — ABNORMAL LOW (ref 8.9–10.3)
Chloride: 108 mmol/L (ref 98–111)
Creatinine, Ser: 1.14 mg/dL (ref 0.61–1.24)
GFR, Estimated: >60 mL/min (ref >60)
Glucose, Bld: 90 mg/dL (ref 70–99)
Potassium: 3.6 mmol/L (ref 3.5–5.1)
Sodium: 141 mmol/L (ref 135–145)

## 2022-11-25 LAB — GLUCOSE, CAPILLARY
Glucose-Capillary: 90 mg/dL (ref 70–99)
Glucose-Capillary: 191 mg/dL — ABNORMAL HIGH (ref 70–99)
Glucose-Capillary: 106 mg/dL — ABNORMAL HIGH (ref 70–99)
Glucose-Capillary: 161 mg/dL — ABNORMAL HIGH (ref 70–99)

## 2022-11-25 LAB — PREPARE RBC (CROSSMATCH)

## 2022-11-25 LAB — HEMOGLOBIN A1C
Hgb A1c MFr Bld: 4.4 % — ABNORMAL LOW (ref 4.8–5.6)
Mean Plasma Glucose: 79.58 mg/dL

## 2022-11-25 MED ORDER — TRAMADOL HCL 50 MG PO TABS
50.0000 mg | ORAL_TABLET | Freq: Four times a day (QID) | ORAL | Status: DC | PRN
  Administered 2022-11-25 – 2022-11-26 (×4): 50 mg via ORAL
  Filled 2022-11-25 (×4): qty 1

## 2022-11-25 MED ORDER — SODIUM CHLORIDE 0.9% IV SOLUTION: Freq: Once | INTRAVENOUS | Status: AC

## 2022-11-25 NOTE — Progress Notes (Addendum)
Mobility Specialist - Progress Note   11/25/22 0951  Mobility  Activity Transferred to/from [name]  Level of Assistance Minimal assist, patient does 75% or more  Assistive Device Front wheel walker  Distance Ambulated (ft) 2 ft  Activity Response Tolerated well  Mobility Referral Yes  $Mobility charge 1 Mobility   Pt supine upon entry, utilizing 3L Jeremy [name]. Pt requesting to use BSC. Pt completed bed mob MinA for trunk support and BLE. Once sitting EOB, Pt able to self support trunk. STS to RW MinA +2 and amb Min guard. Min VC for sequencing, heavy weight bearing through BUE with a slow gait transfer to [facility]. Pt left sitting on BSC with instructions to call out when finished.   Jeremy [name] Mobility Specialist 11/25/22 10:08 AM

## 2022-11-25 NOTE — Progress Notes (Signed)
PROGRESS NOTE  Jeremy [name] QIH:[id number] DOB: 1950-11-11 DOA: 11/24/2022 PCP: Juline Patch, MD  Hospital Course/Subjective: 72 year old gentleman with history of MS relapsing remitting on interferon beta-1b treatment, IDDM, COPD chronically on 2 L nasal cannula, moderate pulmonary hypertension, chronic iron deficiency anemia secondary to slow AVM bleeding who was admitted to the hospital 1/31 for recurrent severe anemia of hemoglobin 5.9.  Patient was admitted to the hospitalist service, he was transfused 2 units of blood overnight, this morning hemoglobin is 7.2.  He remains asymptomatic.  Assessment/Plan: Principal Problem:   GI bleed Active Problems:   Acute on chronic respiratory failure with hypoxia (HCC)   Type 2 diabetes mellitus with diabetic polyneuropathy, with long-term current use of insulin (HCC)   Stage 3a chronic kidney disease (HCC)   COPD (chronic obstructive pulmonary disease) (HCC)   Multiple sclerosis (HCC)   Iron deficiency anemia   Gastroesophageal reflux disease without esophagitis   Primary pulmonary hypertension (HCC)   Symptomatic anemia   Arteriovenous malformation (AVM)  Acute on chronic iron deficiency anemia secondary to chronic GI bleed -Appears to be a chronic GI bleed issue from small bowel polyps vs AVM first shown on a capsule study in 2022, with hemoglobin slowly trending down in the last 4 weeks 8.9> 7.6> 6.2.  No overt active bleeding and his symptoms has progressed gradually -2 units of PRBCs transfused overnight, this morning hemoglobin is 7.2 -Patient has been seen by GI for neurology, they recommend another unit today, and observation overnight -Check iron level -Hold off CTA or bleeding scan as there is no signs of life threatening bleeding.   Acute on chronic HFpEF decompensation -Significant fluid overload, likely secondary to worsening of his anemia -Transfusion as above -Start IV Lasix 40 mg twice daily due to blood transfusions    IDDM -Continue Lantus, add sliding scale   COPD -Stable   Multiple sclerosis -Denies any symptoms of flareup -Continue to follow with neurology for Interferon infusion outpatient.   CKD stage IIIa -Cr 1.1 today, clinically patient is fluid overload. On IV lasx, monitor BMP and volume status   Cirrhosis with coagulopathy -Chronic, as early as 2014, abd U/S showed cirrhosis changes which confirmed several times on CT abd, unknown etiology. -Check INR, he was given PO VItK -No varices found on last year's EGD. -Continue diuretic as above    DVT prophylaxis: SCD Code Status: Full code Family Communication: Discussed in detail this morning with the patient and his wife. Disposition Plan: Inpatient on telemetry, possibly home in the morning if stable.  Consults called: GI  Admission status: Tele admit  Antimicrobials: Anti-infectives (From admission, onward)    None      Objective: Vitals:   11/25/22 0133 11/25/22 0200 11/25/22 0436 11/25/22 0801  BP: (!) 119/47 112/65 (!) 125/47 (!) 153/92  Pulse: 66 70 68 69  Resp: '18 20 20 18  '$ Temp: 98.2 F (36.8 C) 98.4 F (36.9 C) (!) 97.5 F (36.4 C) 98.1 F (36.7 C)  TempSrc: Oral Oral Oral Oral  SpO2: 93% 93% 93% 90%  Weight:      Height:        Intake/Output Summary (Last 24 hours) at 11/25/2022 0942 Last data filed at 11/25/2022 0600 Gross per 24 hour  Intake 890 ml  Output --  Net 890 ml   Filed Weights   11/24/22 1926  Weight: 94.5 kg   Exam: General:  Alert, oriented, calm, in no acute distress seen resting comfortably this morning with his  wife at the bedside. Eyes: EOMI, clear sclerea Neck: supple, no masses, trachea mildline  Cardiovascular: RRR, no murmurs or rubs, no peripheral edema  Respiratory: clear to auscultation bilaterally, no wheezes, no crackles  Abdomen: soft, nontender, nondistended, normal bowel tones heard  Skin: dry, no rashes  Musculoskeletal: no joint effusions, normal range of motion   Psychiatric: appropriate affect, normal speech  Neurologic: extraocular muscles intact, clear speech, moving all extremities with intact sensorium   Data Reviewed: CBC: Recent Labs  Lab 11/24/22 1235 11/24/22 1352 11/24/22 2200 11/25/22 0433  WBC 6.5 6.8  --  4.6  NEUTROABS 4.6  --   --   --   HGB 5.9* 6.2* 6.8* 7.2*  HCT 21.2* 21.9* 22.6* 22.8*  MCV 89.1 89.4  --  85.7  PLT 359 385  --  433   Basic Metabolic Panel: Recent Labs  Lab 11/24/22 1352 11/25/22 0433  NA 141 141  K 4.2 3.6  CL 108 108  CO2 25 24  GLUCOSE 102* 90  BUN 35* 33*  CREATININE 1.22 1.14  CALCIUM 8.3* 8.0*   GFR: Estimated Creatinine Clearance: 69.6 mL/min (by C-G formula based on SCr of 1.14 mg/dL). Liver Function Tests: No results for input(s): "AST", "ALT", "ALKPHOS", "BILITOT", "PROT", "ALBUMIN" in the last 168 hours. No results for input(s): "LIPASE", "AMYLASE" in the last 168 hours. No results for input(s): "AMMONIA" in the last 168 hours. Coagulation Profile: Recent Labs  Lab 11/24/22 1352  INR 1.3*   Cardiac Enzymes: No results for input(s): "CKTOTAL", "CKMB", "CKMBINDEX", "TROPONINI" in the last 168 hours. BNP (last 3 results) No results for input(s): "PROBNP" in the last 8760 hours. HbA1C: Recent Labs    11/24/22 1352  HGBA1C 4.4*   CBG: Recent Labs  Lab 11/24/22 2118 11/25/22 0803  GLUCAP 145* 90   Lipid Profile: No results for input(s): "CHOL", "HDL", "LDLCALC", "TRIG", "CHOLHDL", "LDLDIRECT" in the last 72 hours. Thyroid Function Tests: No results for input(s): "TSH", "T4TOTAL", "FREET4", "T3FREE", "THYROIDAB" in the last 72 hours. Anemia Panel: Recent Labs    11/24/22 1235 11/24/22 1352  FERRITIN  --  26  TIBC  --  325  IRON  --  16*  RETICCTPCT 3.8* 3.8*   Urine analysis:    Component Value Date/Time   COLORURINE YELLOW (A) 08/18/2022 1237   APPEARANCEUR CLEAR (A) 08/18/2022 1237   APPEARANCEUR Hazy 08/27/2013 1157   LABSPEC 1.017 08/18/2022 1237    LABSPEC 1.013 08/27/2013 1157   PHURINE 5.0 08/18/2022 1237   GLUCOSEU NEGATIVE 08/18/2022 1237   GLUCOSEU Negative 08/27/2013 1157   HGBUR MODERATE (A) 08/18/2022 1237   BILIRUBINUR NEGATIVE 08/18/2022 1237   BILIRUBINUR Negative 08/27/2013 1157   KETONESUR NEGATIVE 08/18/2022 1237   PROTEINUR NEGATIVE 08/18/2022 1237   NITRITE NEGATIVE 08/18/2022 1237   LEUKOCYTESUR NEGATIVE 08/18/2022 1237   LEUKOCYTESUR Negative 08/27/2013 1157   Sepsis Labs: '@LABRCNTIP'$ (procalcitonin:4,lacticidven:4)  )No results found for this or any previous visit (from the past 240 hour(s)).   Studies: DG Chest 1 View  Result Date: 11/24/2022 CLINICAL DATA:  Congestive heart failure. EXAM: CHEST  1 VIEW COMPARISON:  November 16, 2022. FINDINGS: Stable cardiomediastinal silhouette. Mild central pulmonary vascular congestion is noted. Right upper lobe nodular density is again noted. New left midlung opacity is noted most consistent with pneumonia given how quickly it is developed. Bony thorax is unremarkable. IMPRESSION: Mild central pulmonary vascular congestion. New left midlung opacity is noted most consistent with pneumonia given how quickly it has developed. Electronically Signed  By: Marijo Conception M.D.   On: 11/24/2022 16:30    Scheduled Meds:  sodium chloride   Intravenous Once   aspirin EC  81 mg Oral Daily   cyanocobalamin  1,000 mcg Oral Daily   ferrous sulfate  325 mg Oral Q breakfast   furosemide  40 mg Intravenous BID   gemfibrozil  600 mg Oral BID AC   insulin aspart  0-9 Units Subcutaneous TID WC   insulin glargine-yfgn  18 Units Subcutaneous QHS   loratadine  10 mg Oral Daily   mupirocin ointment  1 Application Topical BID   pantoprazole  40 mg Oral Daily   pregabalin  200 mg Oral BID   sertraline  25 mg Oral Daily   simvastatin  40 mg Oral QPM   sodium bicarbonate  650 mg Oral BID    Continuous Infusions:  sodium chloride Stopped (11/24/22 2226)   iron sucrose 500 mg (11/25/22 0926)      LOS: 1 day   Time spent: 31 minutes  Arminda Foglio Neva Seat, MD [facility] Pager [phone]  If 7PM-7AM, please contact night-coverage [email or web address] Password [facility] 11/25/2022, 9:42 AM

## 2022-11-25 NOTE — Evaluation (Signed)
Physical Therapy Evaluation Patient Details Name: Jeremy Jeremy [name] MRN: [id number] DOB: December 23, 1950 Today's Date: 11/25/2022  History of Present Illness  72 year old male presenting to hospital with a hemoglobin in the 5s, admitted with chronic GI bleed; PMH significant for  MS relapsing remitting on interferon beta-1B treatment, IDDM, COPD with chronic hypoxic respiratory failure on 2 L continuously, moderate pulmonary hypertension, chronic iron deficiency anemia secondary to AVM bleeding, chronic pain syndrome on tramadol, HTN, CKD stage IIIa  Clinical Impression  Cleared for mobility during transfusion by MD.  Pt eager to work with PT but is very weak and functionally limited compared to his baseline.  Apparently a little more than 1 month ago he was walking around the home and out to the car.  Today he was very limited with what he was able to tolerate with knees buckling b/l and limited tolerance.  O2 staying in the mid 80s to low 90s on 2L at rest and 3L during activity.  Pt family present and report having equipment and experience caring for him, feel confident about ability to manage at home.  He is acutely very weak and will need continued PT to address functional limitations.         Recommendations for follow up therapy are one component of a multi-disciplinary discharge planning process, led by the attending physician.  Recommendations may be updated based on patient status, additional functional criteria and insurance authorization.  Follow Up Recommendations Home health PT      Assistance Recommended at Discharge Frequent or constant Supervision/Assistance  Patient can return home with the following  A lot of help with walking and/or transfers;A little help with bathing/dressing/bathroom;Assistance with cooking/housework;Assist for transportation;Help with stairs or ramp for entrance    Equipment Recommendations None recommended by PT  Recommendations for Other Services        Functional Status Assessment Patient has had a recent decline in their functional status and demonstrates the ability to make significant improvements in function in a reasonable and predictable amount of time.     Precautions / Restrictions Precautions Precautions: Fall Restrictions Weight Bearing Restrictions: Jeremy [name]      Mobility  Bed Mobility               General bed mobility comments: in recliner pre/post session    Transfers Overall transfer level: Needs assistance Equipment used: Rolling walker (2 wheels) Transfers: Sit to/from Stand Sit to Stand: Min guard           General transfer comment: heavy reliance on UEs to rise, did not need physical assist to rise, neither did he get to fully upright with extended knees.    Ambulation/Gait Ambulation/Gait assistance: Min assist Gait Distance (Feet): 10 Feet Assistive device: Rolling walker (2 wheels)         General Gait Details: Pt with very slow and UE/AD reliant gait.  He consistently had low grade knee buckling that he was able to self arrest with UEs on walker - though with increased stance time UEs began to tremble and further ambulation was deferred - pt returned to sitting.  Apparently this was more walking than he has done in ~1 week, fatigued with the effort but pleased to have done as much as he was able to manage.  Stairs            Wheelchair Mobility    Modified Rankin (Stroke Patients Only)       Balance Overall balance assessment: Needs assistance Sitting-balance support: Feet  supported Sitting balance-Leahy Scale: Good     Standing balance support: Bilateral upper extremity supported, During functional activity, Reliant on assistive device for balance Standing balance-Leahy Scale: Fair Standing balance comment: unable to fully extend knees/buckling t/o the standing effort                             Pertinent Vitals/Pain Pain Assessment Pain Assessment: Jeremy [name]/denies  pain    Home Living Family/patient expects to be discharged to:: Private residence Living Arrangements: Spouse/significant other Available Help at Discharge: Family;Available 24 hours/day Type of Home: House Home Access: Ramped entrance       Home Layout: One level Home Equipment: Rollator (4 wheels);Cane - quad;Cane - single point;Shower seat;Grab bars - tub/shower;Wheelchair - manual;Hand held Engineering geologist (2 wheels);Wheelchair - power;Transport chair      Prior Function Prior Level of Function : Independent/Modified Independent;History of Falls (last six months) (recently has been too weak to do more than transfer, has had multiple falls)             Mobility Comments: amb with RW household distances, pt acutely weak needing SPT to transport chair for MRADLs in the house ADLs Comments: Assist for LB dressing due to acute weakness, generally able to perform ADLs with MOD I; assist for all IADLs; 2L O2 continuous     Hand Dominance        Extremity/Trunk Assessment   Upper Extremity Assessment Upper Extremity Assessment: Generalized weakness    Lower Extremity Assessment Lower Extremity Assessment: Generalized weakness (b/l knees buckling during WBing, unable to actively fully extend)       Communication   Communication: Jeremy [name] difficulties  Cognition Arousal/Alertness: Awake/alert Behavior During Therapy: WFL for tasks assessed/performed, Flat affect Overall Cognitive Status: Within Functional Limits for tasks assessed                               Problem Solving: Slow processing          General Comments General comments (skin integrity, edema, etc.): SpO2 generally in the 87-90% range on 2L, bumped to 3L during ambulation with sats dropping to mid 80s but relatively quick to recover to 90s in sitting    Exercises     Assessment/Plan    PT Assessment Patient needs continued PT services  PT Problem List Decreased  strength;Decreased activity tolerance;Decreased mobility;Decreased balance;Decreased knowledge of use of DME;Cardiopulmonary status limiting activity       PT Treatment Interventions DME instruction;Gait training;Functional mobility training;Therapeutic activities;Therapeutic exercise;Balance training;Neuromuscular re-education;Patient/family education    PT Goals (Current goals can be found in the Care Plan section)  Acute Rehab PT Goals Patient Stated Goal: go home PT Goal Formulation: With patient Time For Goal Achievement: 12/08/22 Potential to Achieve Goals: Good    Frequency Min 2X/week     Co-evaluation               AM-PAC PT "6 Clicks" Mobility  Outcome Measure Help needed turning from your back to your side while in a flat bed without using bedrails?: A Little Help needed moving from lying on your back to sitting on the side of a flat bed without using bedrails?: A Little Help needed moving to and from a bed to a chair (including a wheelchair)?: A Little Help needed standing up from a chair using your arms (e.g., wheelchair or bedside chair)?: A Little Help needed  to walk in hospital room?: A Little Help needed climbing 3-5 steps with a railing? : Total 6 Click Score: 16    End of Session Equipment Utilized During Treatment: Gait belt;Oxygen (2-3L) Activity Tolerance: Patient limited by fatigue Patient left: in chair;with call bell/phone within reach;with family/visitor present Nurse Communication: Mobility status (O2) PT Visit Diagnosis: Unsteadiness on feet (R26.81);Muscle weakness (generalized) (M62.81);Difficulty in walking, not elsewhere classified (R26.2)    Time: 7125-2712 PT Time Calculation (min) (ACUTE ONLY): 25 min   Charges:   PT Evaluation $PT Eval Low Complexity: 1 Low PT Treatments $Gait Training: 8-22 mins        Kreg Shropshire, DPT 11/25/2022, 4:53 PM

## 2022-11-25 NOTE — Evaluation (Signed)
Occupational Therapy Evaluation Patient Details Name: Jeremy [name] MRN: [id number] DOB: 04-Jan-1951 Today's Date: 11/25/2022   History of Present Illness Pt is a 72 year old male presenting to hospital with a hemoglobin of 5.9, admitted with chronic GI bleed; PMH significant for  MS relapsing remitting on interferon beta-1B treatment, IDDM, COPD with chronic hypoxic respiratory failure on 2 L continuously, moderate pulmonary hypertension, chronic iron deficiency anemia secondary to AVM bleeding, chronic pain syndrome on tramadol, HTN, CKD stage IIIa   Clinical Impression   Chart reviewed, MD and RN cleared pt for participation after first 15 minutes of blood transfusion. Pt vitals stable throughout. Pt is alert and oriented x4, agreeable to OT evaluation. Pt wife, who is his primary caregiver is present throughout. PTA pt has been acutely weak due to ongoing medical issues, reports typically spt to mwc for mobility around the house, wife assists with ADL/IADL. Pt presents with deficits in strength, endurance, activity tolerance, balance, all affecting safe and optimal ADL completion. Pt wife endorses she feels comfortable with assist level required for Adl/IADL, will have assist from family as needed. Recommend discharge home with HHOT to address functional deficits. OT will continue to follow acutely.      Recommendations for follow up therapy are one component of a multi-disciplinary discharge planning process, led by the attending physician.  Recommendations may be updated based on patient status, additional functional criteria and insurance authorization.   Follow Up Recommendations  Home health OT (if pt does not have assist for ADls at home, STR may be recommended; pt wife and pt report they decline STR)     Assistance Recommended at Discharge Frequent or constant Supervision/Assistance  Patient can return home with the following A lot of help with walking and/or transfers;A lot of help with  bathing/dressing/bathroom;Assistance with cooking/housework;Help with stairs or ramp for entrance;Direct supervision/assist for medications management    Functional Status Assessment  Patient has had a recent decline in their functional status and demonstrates the ability to make significant improvements in function in a reasonable and predictable amount of time.  Equipment Recommendations  None recommended by OT;Other (comment) (pt has recommended equipment)    Recommendations for Other Services       Precautions / Restrictions Precautions Precautions: Fall Restrictions Weight Bearing Restrictions: No      Mobility Bed Mobility               General bed mobility comments: NT in recliner pre/post session    Transfers Overall transfer level: Needs assistance Equipment used: Rolling walker (2 wheels) Transfers: Sit to/from Stand Sit to Stand: Min assist, Mod assist                  Balance Overall balance assessment: Needs assistance Sitting-balance support: Feet supported Sitting balance-Leahy Scale: Good     Standing balance support: Bilateral upper extremity supported, During functional activity, Reliant on assistive device for balance Standing balance-Leahy Scale: Fair                             ADL either performed or assessed with clinical judgement   ADL Overall ADL's : Needs assistance/impaired     Grooming: Supervision/safety               Lower Body Dressing: Maximal assistance   Toilet Transfer: Minimal assistance;Moderate assistance Toilet Transfer Details (indicate cue type and reason): simulated with RW  Vision Baseline Vision/History: 1 Wears glasses Patient Visual Report: No change from baseline       Perception     Praxis      Pertinent Vitals/Pain Pain Assessment Pain Assessment: No/denies pain     Hand Dominance     Extremity/Trunk Assessment Upper Extremity Assessment Upper  Extremity Assessment: Generalized weakness (L weaker than R throughout)   Lower Extremity Assessment Lower Extremity Assessment: Generalized weakness       Communication Communication Communication: No difficulties   Cognition Arousal/Alertness: Awake/alert Behavior During Therapy: WFL for tasks assessed/performed, Flat affect Overall Cognitive Status: Impaired/Different from baseline Area of Impairment: Problem solving                             Problem Solving: Slow processing       General Comments  vital signs monitored, stable througout; spo2 >90% on 2 L via [place]    Exercises Other Exercises Other Exercises: edu pt and wife re: role of OT, role of rehab, discharge recommendations, home safety, DME use   Shoulder Instructions      Home Living Family/patient expects to be discharged to:: Private residence Living Arrangements: Spouse/significant other Available Help at Discharge: Family;Available 24 hours/day Type of Home: House Home Access:  (1 step back entrence)     Home Layout: One level     Bathroom Shower/Tub: Teacher, early years/pre: Handicapped height     Home Equipment: Rollator (4 wheels);Cane - quad;Cane - single point;Shower seat;Grab bars - tub/shower;Wheelchair - manual;Hand held Engineering geologist (2 wheels);Wheelchair - power;Transport chair          Prior Functioning/Environment Prior Level of Function : Independent/Modified Independent;History of Falls (last six months) (about 6 falls in the last 6 months)             Mobility Comments: amb with RW household distances, pt acutely weak with pt performing SPT to transport chair for MRADLs in the house ADLs Comments: Assist for LB dressing due to acute weakness, generally able to perform ADLs with MOD I; assist for all IADLs; o2 use at all times per pt report now        OT Problem List: Decreased strength;Decreased activity tolerance;Impaired balance (sitting  and/or standing);Decreased safety awareness;Decreased knowledge of precautions;Decreased knowledge of use of DME or AE      OT Treatment/Interventions: Self-care/ADL training;Patient/family education;Therapeutic exercise;Balance training;Energy conservation;Therapeutic activities;DME and/or AE instruction    OT Goals(Current goals can be found in the care plan section) Acute Rehab OT Goals Patient Stated Goal: go home OT Goal Formulation: With patient/family Time For Goal Achievement: 12/09/22 Potential to Achieve Goals: Good ADL Goals Pt Will Perform Grooming: with supervision;sitting Pt Will Perform Lower Body Dressing: with min assist;sitting/lateral leans Pt Will Transfer to Toilet: with supervision Pt Will Perform Toileting - Clothing Manipulation and hygiene: with supervision  OT Frequency: Min 2X/week    Co-evaluation              AM-PAC OT "6 Clicks" Daily Activity     Outcome Measure Help from another person eating meals?: None Help from another person taking care of personal grooming?: A Little Help from another person toileting, which includes using toliet, bedpan, or urinal?: A Lot Help from another person bathing (including washing, rinsing, drying)?: A Lot Help from another person to put on and taking off regular upper body clothing?: A Little Help from another person to put on and taking  off regular lower body clothing?: A Lot 6 Click Score: 16   End of Session Equipment Utilized During Treatment: Rolling walker (2 wheels);Oxygen Nurse Communication: Mobility status  Activity Tolerance: Patient tolerated treatment well Patient left: in chair;with call bell/phone within reach;with family/visitor present  OT Visit Diagnosis: Unsteadiness on feet (R26.81);Repeated falls (R29.6)                Time: 3225-6720 OT Time Calculation (min): 23 min Charges:  OT General Charges $OT Visit: 1 Visit OT Evaluation $OT Eval Moderate Complexity: 1 Mod  Shanon Payor, OTD  OTR/L  11/25/22, 4:22 PM

## 2022-11-25 NOTE — TOC Initial Note (Signed)
Transition of Care [facility]) - Initial/Assessment Note    Patient Details  Name: Jeremy [name] MRN: [id number] Date of Birth: 06/13/1951  [facility]) CM/SW Contact:    Candie Chroman, LCSW Phone Number: 11/25/2022, 10:28 AM  Clinical Narrative:  Readmission prevention screen complete. CSW met with patient. Wife at bedside. CSW introduced role and explained that discharge planning would be discussed. PCP is Otilio Miu, MD. Wife transports him to appointments. Pharmacy is [facility] [place] [facility]. No issues obtaining medications. Patient lives home with wife. No home health prior to admission but they would be agreeable if recommended. MD has consulted PT and OT. Patient last worked with [facility] and patient and wife would like to work with them again. Patient has a RW, rollator, BSC, shower chair, and transfer chair at home. He is also on chronic oxygen through [employer] (2 L per notes). No further concerns. CSW encouraged patient to contact CSW as needed. CSW will continue to follow patient for support and facilitate return home once stable. Wife will transport him home at discharge.                Expected Discharge Plan: [place] Barriers to Discharge: Continued Medical Work up   Patient Goals and CMS Choice     Choice offered to / list presented to : Patient, Spouse      Expected Discharge Plan and Services     Post Acute Care Choice: [place] arrangements for the past 2 months: [place]                                      Prior Living Arrangements/Services Living arrangements for the past 2 months: Single Family Home Lives with:: Spouse Patient language and need for interpreter reviewed:: Yes Do you feel safe going back to the place where you live?: Yes      Need for Family Participation in Patient Care: Yes (Comment) Care giver support system in place?: Yes (comment) Current home services: DME Criminal  Activity/Legal Involvement Pertinent to Current Situation/Hospitalization: No - Comment as needed  Activities of Daily Living Home Assistive Devices/Equipment: CBG Meter, Eyeglasses, Grab bars in shower, Oxygen, Shower chair with back, Raised toilet seat with rails, Walker (specify type), Wheelchair ADL Screening (condition at time of admission) Patient's cognitive ability adequate to safely complete daily activities?: Yes Is the patient deaf or have difficulty hearing?: No Does the patient have difficulty seeing, even when wearing glasses/contacts?: No Does the patient have difficulty concentrating, remembering, or making decisions?: No Patient able to express need for assistance with ADLs?: Yes Does the patient have difficulty dressing or bathing?: No Independently performs ADLs?: No Communication: Independent Dressing (OT): Independent Grooming: Independent Feeding: Independent Bathing: Independent Toileting: Needs assistance Is this a change from baseline?: Pre-admission baseline In/Out Bed: Dependent Is this a change from baseline?: Change from baseline, expected to last <3 days Walks in Home: Dependent Is this a change from baseline?: Change from baseline, expected to last <3 days Does the patient have difficulty walking or climbing stairs?: Yes Weakness of Legs: Both Weakness of Arms/Hands: Both  Permission Sought/Granted Permission sought to share information with : Facility Sport and exercise psychologist, Family Supports    Share Information with NAME: Derl Abalos  Permission granted to share info w AGENCY: [place] granted to share info w Relationship: Spouse  Permission  granted to share info w Contact Information: [phone]  Emotional Assessment Appearance:: Appears stated age Attitude/Demeanor/Rapport: Engaged, Gracious Affect (typically observed): Accepting, Appropriate, Calm, Pleasant Orientation: : Oriented to Self, Oriented to Place,  Oriented to  Time, Oriented to Situation Alcohol / Substance Use: Not Applicable Psych Involvement: No (comment)  Admission diagnosis:  GI bleed [K92.2] Symptomatic anemia [D64.9] Acute on chronic anemia [D64.9] Patient Active Problem List   Diagnosis Date Noted   GI bleed 11/24/2022   Arteriovenous malformation (AVM) 10/21/2022   Weakness 08/18/2022   AKI (acute kidney injury) ([place]) 08/18/2022   Severe sepsis ([place]) 02/04/2022   Pneumonia 02/03/2022   Acute on chronic respiratory failure with hypoxia ([place]) 02/03/2022   Hyperkalemia 02/03/2022   COPD (chronic obstructive pulmonary disease) ([place]) 02/03/2022   SIRS (systemic inflammatory response syndrome), possible sepsis ([place]) 02/03/2022   Anemia due to blood loss 09/24/2021   Gastric polyp    Gastritis without bleeding    Symptomatic anemia 06/19/2021   Iron deficiency anemia due to chronic blood loss 06/19/2021   Gait abnormality 05/07/2021   Stage 3a chronic kidney disease ([place]) 05/20/2020   DM type 2 with diabetic peripheral neuropathy ([place]) 05/06/2020   Idiopathic peripheral neuropathy 01/15/2020   Generalized edema 01/15/2020   Primary pulmonary hypertension ([place]) 10/28/2019   Therapeutic drug monitoring 03/23/2018   Mild episode of recurrent major depressive disorder ([place]) 09/26/2017   Ventricular ectopic beats 09/26/2017   Type 2 diabetes mellitus with diabetic polyneuropathy, with long-term current use of insulin ([place]) 08/12/2017   Hyperlipidemia due to type 2 diabetes mellitus ([place]) 08/12/2017   B12 deficiency 12/14/2016   Low back pain 03/10/2016   Lumbosacral disc disease 01/01/2016   Neuropathy associated with endocrine disorder ([place]) 11/19/2015   Iron deficiency anemia 06/03/2015   Essential hypertension 06/03/2015   Hyperlipidemia 06/03/2015   Depression 06/03/2015   Gastroesophageal reflux disease without esophagitis 06/03/2015   Edema extremities 06/03/2015   Multiple sclerosis ([place]) 07/26/2013    Abnormality of gait 07/26/2013   Morbid obesity ([place]) 07/26/2013   PCP:  Juline Patch, MD Pharmacy:   [facility] [place], [place] - [place] [address] AT [place] [address] [facility] [place] [address] Phone: [phone] Fax: [place], [address] [place] [address] Phone: [phone] Fax: [phone]     Social Determinants of Health (SDOH) Social History: SDOH Screenings   Food Insecurity: No Food Insecurity (11/19/2022)  Housing: Low Risk  (11/19/2022)  Transportation Needs: No Transportation Needs (11/19/2022)  Utilities: Not At Risk (11/19/2022)  Alcohol Screen: Low Risk  (11/16/2021)  Depression (PHQ2-9): Low Risk  (11/16/2022)  Financial Resource Strain: Low Risk  (11/19/2022)  Physical Activity: Insufficiently Active (11/19/2022)  Social Connections: Moderately Isolated (11/19/2022)  Stress: No Stress Concern Present (11/19/2022)  Tobacco Use: Medium Risk (11/24/2022)   SDOH Interventions:     Readmission Risk Interventions    11/25/2022   10:25 AM 02/04/2022    9:47 AM  Readmission Risk Prevention Plan  Transportation Screening Complete Complete  PCP or Specialist Appt within 3-5 Days Complete Complete  HRI or [place] Complete   Social Work Consult for [place] Planning/Counseling Complete Complete  Palliative Care Screening Not Applicable Not Applicable  Medication Review Press photographer) Complete Complete

## 2022-11-26 ENCOUNTER — Telehealth: Payer: Self-pay

## 2022-11-26 DIAGNOSIS — K922 Gastrointestinal hemorrhage, unspecified: Secondary | ICD-10-CM | POA: Diagnosis not present

## 2022-11-26 LAB — BPAM RBC
Blood Product Expiration Date: 202402262359
Blood Product Expiration Date: 202402282359
Blood Product Expiration Date: 202402282359
ISSUE DATE / TIME: 202401311535
ISSUE DATE / TIME: 202401312256
ISSUE DATE / TIME: 202402011419
Unit Type and Rh: 6200
Unit Type and Rh: 6200
Unit Type and Rh: 6200

## 2022-11-26 LAB — CBC
HCT: 28.3 % — ABNORMAL LOW (ref 39.0–52.0)
Hemoglobin: 9 g/dL — ABNORMAL LOW (ref 13.0–17.0)
MCH: 27.3 pg (ref 26.0–34.0)
MCHC: 31.8 g/dL (ref 30.0–36.0)
MCV: 85.8 fL (ref 80.0–100.0)
Platelets: 347 10*3/uL (ref 150–400)
RBC: 3.3 MIL/uL — ABNORMAL LOW (ref 4.22–5.81)
RDW: 17.9 % — ABNORMAL HIGH (ref 11.5–15.5)
WBC: 6.4 10*3/uL (ref 4.0–10.5)
nRBC: 0 % (ref 0.0–0.2)

## 2022-11-26 LAB — TYPE AND SCREEN
ABO/RH(D): A POS
Antibody Screen: NEGATIVE
Unit division: 0
Unit division: 0
Unit division: 0

## 2022-11-26 LAB — BASIC METABOLIC PANEL
Anion gap: 6 (ref 5–15)
BUN: 28 mg/dL — ABNORMAL HIGH (ref 8–23)
CO2: 31 mmol/L (ref 22–32)
Calcium: 8.4 mg/dL — ABNORMAL LOW (ref 8.9–10.3)
Chloride: 103 mmol/L (ref 98–111)
Creatinine, Ser: 1.27 mg/dL — ABNORMAL HIGH (ref 0.61–1.24)
GFR, Estimated: >60 mL/min (ref >60)
Glucose, Bld: 92 mg/dL (ref 70–99)
Potassium: 4 mmol/L (ref 3.5–5.1)
Sodium: 140 mmol/L (ref 135–145)

## 2022-11-26 LAB — GLUCOSE, CAPILLARY: Glucose-Capillary: 89 mg/dL (ref 70–99)

## 2022-11-26 NOTE — Telephone Encounter (Signed)
Transition Care Management Follow-up Telephone Call Date of discharge and from where: 11/26/2022-[facility] How have you been since you were released from the hospital? "Feel better than I did"- did infusion and transfusion Any questions or concerns? Yes  Items Reviewed: Did the pt receive and understand the discharge instructions provided? Yes  Medications obtained and verified? No  Other? No  Any new allergies since your discharge? No  Dietary orders reviewed? Yes Do you have support at home? Yes   Home Care and Equipment/Supplies: PT  Functional Questionnaire: (I = Independent and D = Dependent) ADLs: I  Bathing/Dressing- I  Meal Prep- I  Eating- I  Maintaining continence- I  Transferring/Ambulation- I  Managing Meds- I  Follow up appointments reviewed:  PCP Hospital f/u appt confirmed? Yes  Scheduled to see Dr Otilio Miu on 12/10/22 @ 2.20 Specialist Hospital f/u appt confirmed? Yes  Scheduled to see [facility] on 12/08/2022. Are transportation arrangements needed? No  If their condition worsens, is the pt aware to call PCP or go to the Emergency Dept.? Yes Was the patient provided with contact information for the PCP's office or ED? Yes Was to pt encouraged to call back with questions or concerns? Yes

## 2022-11-26 NOTE — Discharge Summary (Signed)
Discharge Summary  Jeremy [name] OZH:[id number] DOB: 1951/03/28  PCP: Juline Patch, MD  Admit date: 11/24/2022 Discharge date: 11/26/2022  Recommendations for Outpatient Follow-up:  Please follow up with your PCP with CBC and BMP in 1-2 weeks Follow up with [facility] as previously scheduled on 12/08/22.  Discharge Diagnoses:  Active Hospital Problems   Diagnosis Date Noted   GI bleed 11/24/2022   Acute on chronic respiratory failure with hypoxia (HCC) 02/03/2022    Priority: High   Type 2 diabetes mellitus with diabetic polyneuropathy, with long-term current use of insulin ([place]) 08/12/2017    Priority: 2.   Stage 3a chronic kidney disease ([place]) 05/20/2020    Priority: 3.   COPD (chronic obstructive pulmonary disease) ([place]) 02/03/2022    Priority: 4.   Multiple sclerosis ([place]) 07/26/2013    Priority: 5.   Arteriovenous malformation (AVM) 10/21/2022   Symptomatic anemia 06/19/2021   Primary pulmonary hypertension ([place]) 10/28/2019   Iron deficiency anemia 06/03/2015   Gastroesophageal reflux disease without esophagitis 06/03/2015    Resolved Hospital Problems  No resolved problems to display.   Discharge Condition: Stable   Diet recommendation: Diet Orders (From admission, onward)     Start     Ordered   11/24/22 1614  Diet heart healthy/carb modified Room service appropriate? Yes; Fluid consistency: Thin; Fluid restriction: 1800 mL Fluid  Diet effective now       Question Answer Comment  Diet-HS Snack? Nothing   Room service appropriate? 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 Patient was admitted to the hospitalist service, he was transfused 3 units of blood overnight, this morning hemoglobin is 9.0 and stable. Per GI, he can go home today. He plans to follow up with GI at [facility] for DBE or other investigation.  Procedures: Blood transfusion x3  Consultations: GI  Discharge details, plan of care and follow up instructions were discussed with patient and any available family or care providers. Patient and family are in agreement with discharge from the hospital today and all questions were answered to their satisfaction.  Discharge Exam: BP (!) 142/58   Pulse 76   Temp 97.8 F (36.6 C) (Oral)   Resp 16   Ht '5\' 10"'$  (1.778 m)   Wt 94.5 kg   SpO2 (!) 89%   BMI 29.89 kg/m  General:  Alert, oriented, calm, in no acute distress  Eyes: EOMI, clear sclerea Neck: supple, no masses, trachea mildline  Cardiovascular: RRR, no murmurs or rubs, no peripheral edema  Respiratory: clear to auscultation bilaterally, no wheezes, no crackles  Abdomen: soft, nontender, nondistended, normal bowel tones heard  Skin: dry, no rashes  Musculoskeletal: no joint effusions, normal range of motion  Psychiatric: appropriate affect, normal speech  Neurologic: extraocular muscles intact, clear speech, moving all extremities with intact sensorium   Discharge Instructions You were cared for by a hospitalist during your hospital stay. If you have any questions about your discharge medications or the care you received while you were in the hospital after you are discharged, you can call the  unit and asked to speak with the hospitalist on call if the hospitalist that took care of you is not available. Once you are discharged, your primary care physician will handle any further medical issues. Please note that NO REFILLS for any discharge medications will be authorized once you are discharged, as it is imperative that you return to your primary care physician (or establish a  relationship with a primary care physician if you do not have one) for your aftercare needs so that they can reassess your need for medications and monitor your lab values.   Allergies as of 11/26/2022       Reactions   Betadine [povidone Iodine]         Medication List     STOP taking these medications    aspirin 81 MG tablet       TAKE these medications    acetaminophen 500 MG tablet Commonly known as: TYLENOL Take 500 mg by mouth every 6 (six) hours as needed for mild pain.   albuterol 108 (90 Base) MCG/ACT inhaler Commonly known as: VENTOLIN HFA Inhale 2 puffs into the lungs in the morning, at noon, in the evening, and at bedtime.   B-D INSULIN SYRINGE 1CC/25GX1" 25G X 1" 1 ML Misc Generic drug: Insulin Syringe-Needle U-100 USE AS DIRECTED   Betaseron 0.3 MG Kit injection Generic drug: Interferon Beta-1b Inject subcutaneously 1 syringe every other day.   cyanocobalamin 500 MCG tablet Commonly known as: VITAMIN B12 Take 1,000 mcg by mouth daily.   ferrous sulfate 325 (65 FE) MG tablet Take 325 mg by mouth daily with breakfast.   gemfibrozil 600 MG tablet Commonly known as: LOPID TAKE 1 TABLET BY MOUTH TWICE  DAILY BEFORE MEALS What changed:  how much to take how to take this when to take this   insulin glargine 100 UNIT/ML Solostar Pen Commonly known as: LANTUS Inject 20 Units into the skin at bedtime.   loratadine 10 MG tablet Commonly known as: CLARITIN Take 1 tablet (10 mg total) by mouth daily.   metFORMIN 500 MG tablet Commonly known as: GLUCOPHAGE Take 500 mg by mouth 2 (two) times daily.   mupirocin ointment 2 % Commonly known as: BACTROBAN Apply 1 Application topically 2 (two) times daily. Apply to effected area bid   omeprazole 40 MG capsule Commonly known as: PRILOSEC TAKE 1 CAPSULE BY MOUTH DAILY What changed:  how much to take how to take this when to take this   pregabalin 200 MG capsule Commonly known as: LYRICA Take 1  capsule (200 mg total) by mouth 2 (two) times daily.   sertraline 50 MG tablet Commonly known as: ZOLOFT Take 1/2 tablet daily What changed:  how much to take how to take this when to take this   simvastatin 40 MG tablet Commonly known as: ZOCOR Take 1 tablet (40 mg total) by mouth daily.   sodium bicarbonate 650 MG tablet Take 650 mg by mouth 2 (two) times daily.   traMADol 50 MG tablet Commonly known as: ULTRAM TAKE 1 TABLET BY MOUTH EVERY 6 HOURS AS NEEDED       Allergies  Allergen Reactions   Betadine [Povidone Iodine]     Follow-up Information     Juline Patch, MD Follow up in 2 week(s).   Specialty: Family Medicine Contact information: [address] [place] [address] [phone]                 The results of significant  diagnostics from this hospitalization (including imaging, microbiology, ancillary and laboratory) are listed below for reference.    Significant Diagnostic Studies: DG Chest 1 View  Result Date: 11/24/2022 CLINICAL DATA:  Congestive heart failure. EXAM: CHEST  1 VIEW COMPARISON:  November 16, 2022. FINDINGS: Stable cardiomediastinal silhouette. Mild central pulmonary vascular congestion is noted. Right upper lobe nodular density is again noted. New left midlung opacity is noted most consistent with pneumonia given how quickly it is developed. Bony thorax is unremarkable. IMPRESSION: Mild central pulmonary vascular congestion. New left midlung opacity is noted most consistent with pneumonia given how quickly it has developed. Electronically Signed   By: Marijo Conception M.D.   On: 11/24/2022 16:30   DG Chest 2 View  Result Date: 11/16/2022 CLINICAL DATA:  Productive cough.  Dyspnea. EXAM: CHEST - 2 VIEW COMPARISON:  Chest two views 08/23/2022, 06/05/2022, chest two views 12/25/2020, CT chest 08/18/2022, chest CT 10/07/2021; PET-CT 11/30/2021 FINDINGS: Cardiac silhouette and mediastinal contours within normal limits. Mild  calcification within aortic arch. Pulmonary nodule within the anterior right upper lobe is unchanged from the prior radiographs and CTs. This was not hypermetabolic on prior PET-CT. This was previously determined to be a benign/indolent process. No acute airspace opacity. No pleural effusion or pneumothorax. Mild multilevel degenerative disc changes of the thoracic spine. IMPRESSION: 1. No active cardiopulmonary disease. 2. Stable right upper lobe pulmonary nodule. This was previously determined to be a benign/indolent process due to stability on CT and lack of abnormal uptake on PET. Electronically Signed   By: Yvonne Kendall M.D.   On: 11/16/2022 16:56    Microbiology: No results found for this or any previous visit (from the past 240 hour(s)).   Labs: Basic Metabolic Panel: Recent Labs  Lab 11/24/22 1352 11/25/22 0433 11/26/22 0520  NA 141 141 140  K 4.2 3.6 4.0  CL 108 108 103  CO2 '25 24 31  '$ GLUCOSE 102* 90 92  BUN 35* 33* 28*  CREATININE 1.22 1.14 1.27*  CALCIUM 8.3* 8.0* 8.4*   Liver Function Tests: No results for input(s): "AST", "ALT", "ALKPHOS", "BILITOT", "PROT", "ALBUMIN" in the last 168 hours. No results for input(s): "LIPASE", "AMYLASE" in the last 168 hours. No results for input(s): "AMMONIA" in the last 168 hours. CBC: Recent Labs  Lab 11/24/22 1235 11/24/22 1352 11/24/22 2200 11/25/22 0433 11/25/22 1931 11/26/22 0520  WBC 6.5 6.8  --  4.6  --  6.4  NEUTROABS 4.6  --   --   --   --   --   HGB 5.9* 6.2* 6.8* 7.2* 9.7* 9.0*  HCT 21.2* 21.9* 22.6* 22.8* 31.0* 28.3*  MCV 89.1 89.4  --  85.7  --  85.8  PLT 359 385  --  312  --  347   Cardiac Enzymes: No results for input(s): "CKTOTAL", "CKMB", "CKMBINDEX", "TROPONINI" in the last 168 hours. BNP: BNP (last 3 results) No results for input(s): "BNP" in the last 8760 hours.  ProBNP (last 3 results) No results for input(s): "PROBNP" in the last 8760 hours.  CBG: Recent Labs  Lab 11/25/22 0803 11/25/22 1152  11/25/22 1653 11/25/22 2054 11/26/22 0739  GLUCAP 90 191* 106* 161* 89    Time spent: > 30 minutes were spent in preparing this discharge including medication reconciliation, counseling, and coordination of care.  Signed:  Keyoni Lapinski Neva Seat, MD  [facility] 11/26/2022, 9:03 AM

## 2022-11-26 NOTE — Progress Notes (Signed)
Discharge instructions reviewed with patient including followup visits and new medications.  Understanding was verbalized and all questions were answered.  IV removed without complication; patient tolerated well.  Patient discharged home via wheelchair in stable condition escorted by nursing staff.

## 2022-11-26 NOTE — Care Management Important Message (Signed)
Important Message  Patient Details  Name: Jeremy [name] MRN: [id number] Date of Birth: 10-19-1951   Medicare Important Message Given:  N/A - LOS <3 / Initial given by admissions     Dannette Barbara 11/26/2022, 9:02 AM

## 2022-11-26 NOTE — TOC Transition Note (Signed)
Transition of Care [facility]) - CM/SW Discharge Note   Patient Details  Name: Jeremy [name] MRN: [id number] Date of Birth: 12-12-1950  [facility] CM/SW Contact:  Candie Chroman, LCSW Phone Number: 11/26/2022, 9:05 AM   Clinical Narrative:  Patient has orders to discharge home today. [place] liaison is aware. No DME recommendations. No further concerns. CSW signing off.   Final next level of care: [place] Barriers to Discharge: Barriers Resolved   Patient Goals and CMS Choice   Choice offered to / list presented to : Patient, Spouse  Discharge Placement                  Patient to be transferred to facility by: Wife   Patient and family notified of of transfer: 11/26/22  Discharge Plan and Services Additional resources added to the After Visit Summary for       Post Acute Care Choice: Home Health                    HH Arranged: PT, OT [facility] Agency: [place] ([facility]) Date [place]: 11/26/22   Representative spoke with at [place]: Floydene Flock  Social Determinants of Health (SDOH) Interventions SDOH Screenings   Food Insecurity: No Food Insecurity (11/25/2022)  Housing: Low Risk  (11/25/2022)  Transportation Needs: No Transportation Needs (11/25/2022)  Utilities: Not At Risk (11/25/2022)  Alcohol Screen: Low Risk  (11/16/2021)  Depression (PHQ2-9): Low Risk  (11/16/2022)  Financial Resource Strain: Low Risk  (11/19/2022)  Physical Activity: Insufficiently Active (11/19/2022)  Social Connections: Moderately Isolated (11/19/2022)  Stress: No Stress Concern Present (11/19/2022)  Tobacco Use: Medium Risk (11/24/2022)     Readmission Risk Interventions    11/25/2022   10:25 AM 02/04/2022    9:47 AM  Readmission Risk Prevention Plan  Transportation Screening Complete Complete  PCP or Specialist Appt within 3-5 Days Complete Complete  HRI or Home Care Consult Complete   Social Work Consult for [place] Planning/Counseling Complete Complete  Palliative Care Screening Not Applicable Not Applicable  Medication Review [employer] photographer) Complete Complete

## 2022-11-28 DIAGNOSIS — Z9181 History of falling: Secondary | ICD-10-CM | POA: Diagnosis not present

## 2022-11-28 DIAGNOSIS — M6281 Muscle weakness (generalized): Secondary | ICD-10-CM | POA: Diagnosis not present

## 2022-11-28 DIAGNOSIS — I700 Atherosclerosis of aorta: Secondary | ICD-10-CM | POA: Diagnosis not present

## 2022-11-28 DIAGNOSIS — E1122 Type 2 diabetes mellitus with diabetic chronic kidney disease: Secondary | ICD-10-CM | POA: Diagnosis not present

## 2022-11-28 DIAGNOSIS — I130 Hypertensive heart and chronic kidney disease with heart failure and stage 1 through stage 4 chronic kidney disease, or unspecified chronic kidney disease: Secondary | ICD-10-CM | POA: Diagnosis not present

## 2022-11-28 DIAGNOSIS — I270 Primary pulmonary hypertension: Secondary | ICD-10-CM | POA: Diagnosis not present

## 2022-11-28 DIAGNOSIS — Z87891 Personal history of nicotine dependence: Secondary | ICD-10-CM | POA: Diagnosis not present

## 2022-11-28 DIAGNOSIS — G35 Multiple sclerosis: Secondary | ICD-10-CM | POA: Diagnosis not present

## 2022-11-28 DIAGNOSIS — D689 Coagulation defect, unspecified: Secondary | ICD-10-CM | POA: Diagnosis not present

## 2022-11-28 DIAGNOSIS — R911 Solitary pulmonary nodule: Secondary | ICD-10-CM | POA: Diagnosis not present

## 2022-11-28 DIAGNOSIS — N1831 Chronic kidney disease, stage 3a: Secondary | ICD-10-CM | POA: Diagnosis not present

## 2022-11-28 DIAGNOSIS — E785 Hyperlipidemia, unspecified: Secondary | ICD-10-CM | POA: Diagnosis not present

## 2022-11-28 DIAGNOSIS — Z794 Long term (current) use of insulin: Secondary | ICD-10-CM | POA: Diagnosis not present

## 2022-11-28 DIAGNOSIS — D500 Iron deficiency anemia secondary to blood loss (chronic): Secondary | ICD-10-CM | POA: Diagnosis not present

## 2022-11-28 DIAGNOSIS — J9621 Acute and chronic respiratory failure with hypoxia: Secondary | ICD-10-CM | POA: Diagnosis not present

## 2022-11-28 DIAGNOSIS — Z79891 Long term (current) use of opiate analgesic: Secondary | ICD-10-CM | POA: Diagnosis not present

## 2022-11-28 DIAGNOSIS — Z9981 Dependence on supplemental oxygen: Secondary | ICD-10-CM | POA: Diagnosis not present

## 2022-11-28 DIAGNOSIS — Z7984 Long term (current) use of oral hypoglycemic drugs: Secondary | ICD-10-CM | POA: Diagnosis not present

## 2022-11-28 DIAGNOSIS — J449 Chronic obstructive pulmonary disease, unspecified: Secondary | ICD-10-CM | POA: Diagnosis not present

## 2022-11-28 DIAGNOSIS — I5033 Acute on chronic diastolic (congestive) heart failure: Secondary | ICD-10-CM | POA: Diagnosis not present

## 2022-11-28 DIAGNOSIS — F32A Depression, unspecified: Secondary | ICD-10-CM | POA: Diagnosis not present

## 2022-11-28 DIAGNOSIS — Q2730 Arteriovenous malformation, site unspecified: Secondary | ICD-10-CM | POA: Diagnosis not present

## 2022-11-28 DIAGNOSIS — K219 Gastro-esophageal reflux disease without esophagitis: Secondary | ICD-10-CM | POA: Diagnosis not present

## 2022-11-28 DIAGNOSIS — K922 Gastrointestinal hemorrhage, unspecified: Secondary | ICD-10-CM | POA: Diagnosis not present

## 2022-11-28 DIAGNOSIS — K7469 Other cirrhosis of liver: Secondary | ICD-10-CM | POA: Diagnosis not present

## 2022-11-29 ENCOUNTER — Other Ambulatory Visit: Payer: Self-pay | Admitting: Family Medicine

## 2022-11-29 ENCOUNTER — Telehealth: Payer: Self-pay | Admitting: Family Medicine

## 2022-11-29 DIAGNOSIS — E782 Mixed hyperlipidemia: Secondary | ICD-10-CM

## 2022-11-29 DIAGNOSIS — K219 Gastro-esophageal reflux disease without esophagitis: Secondary | ICD-10-CM

## 2022-11-29 DIAGNOSIS — F331 Major depressive disorder, recurrent, moderate: Secondary | ICD-10-CM

## 2022-11-29 NOTE — Telephone Encounter (Signed)
Home Health Verbal Orders - Caller/Agency: Cindy  from [place] Number: [phone] [id number] PT Frequency:2x5 1x4

## 2022-11-30 DIAGNOSIS — I5033 Acute on chronic diastolic (congestive) heart failure: Secondary | ICD-10-CM | POA: Diagnosis not present

## 2022-11-30 DIAGNOSIS — Q2730 Arteriovenous malformation, site unspecified: Secondary | ICD-10-CM | POA: Diagnosis not present

## 2022-11-30 DIAGNOSIS — K922 Gastrointestinal hemorrhage, unspecified: Secondary | ICD-10-CM | POA: Diagnosis not present

## 2022-11-30 DIAGNOSIS — E1122 Type 2 diabetes mellitus with diabetic chronic kidney disease: Secondary | ICD-10-CM | POA: Diagnosis not present

## 2022-11-30 DIAGNOSIS — I130 Hypertensive heart and chronic kidney disease with heart failure and stage 1 through stage 4 chronic kidney disease, or unspecified chronic kidney disease: Secondary | ICD-10-CM | POA: Diagnosis not present

## 2022-11-30 DIAGNOSIS — D500 Iron deficiency anemia secondary to blood loss (chronic): Secondary | ICD-10-CM | POA: Diagnosis not present

## 2022-11-30 NOTE — Telephone Encounter (Signed)
Called left message giving verbal orders. Name was stated on VM.  KP

## 2022-12-01 ENCOUNTER — Inpatient Hospital Stay: Payer: Medicare Other | Attending: Oncology

## 2022-12-01 ENCOUNTER — Inpatient Hospital Stay: Payer: Medicare Other

## 2022-12-01 ENCOUNTER — Telehealth: Payer: Self-pay | Admitting: Family Medicine

## 2022-12-01 VITALS — BP 128/57 | HR 70 | Temp 99.0°F | Resp 16

## 2022-12-01 DIAGNOSIS — D649 Anemia, unspecified: Secondary | ICD-10-CM

## 2022-12-01 DIAGNOSIS — D500 Iron deficiency anemia secondary to blood loss (chronic): Secondary | ICD-10-CM

## 2022-12-01 DIAGNOSIS — N1831 Chronic kidney disease, stage 3a: Secondary | ICD-10-CM | POA: Insufficient documentation

## 2022-12-01 DIAGNOSIS — D631 Anemia in chronic kidney disease: Secondary | ICD-10-CM | POA: Diagnosis not present

## 2022-12-01 LAB — CBC WITH DIFFERENTIAL/PLATELET
Abs Immature Granulocytes: 0.02 10*3/uL (ref 0.00–0.07)
Basophils Absolute: 0.1 10*3/uL (ref 0.0–0.1)
Basophils Relative: 1 %
Eosinophils Absolute: 0.3 10*3/uL (ref 0.0–0.5)
Eosinophils Relative: 5 %
HCT: 32.8 % — ABNORMAL LOW (ref 39.0–52.0)
Hemoglobin: 10 g/dL — ABNORMAL LOW (ref 13.0–17.0)
Immature Granulocytes: 0 %
Lymphocytes Relative: 14 %
Lymphs Abs: 0.7 10*3/uL (ref 0.7–4.0)
MCH: 27.9 pg (ref 26.0–34.0)
MCHC: 30.5 g/dL (ref 30.0–36.0)
MCV: 91.4 fL (ref 80.0–100.0)
Monocytes Absolute: 0.4 10*3/uL (ref 0.1–1.0)
Monocytes Relative: 9 %
Neutro Abs: 3.5 10*3/uL (ref 1.7–7.7)
Neutrophils Relative %: 71 %
Platelets: 313 10*3/uL (ref 150–400)
RBC: 3.59 MIL/uL — ABNORMAL LOW (ref 4.22–5.81)
RDW: 19.2 % — ABNORMAL HIGH (ref 11.5–15.5)
WBC: 5 10*3/uL (ref 4.0–10.5)
nRBC: 0 % (ref 0.0–0.2)

## 2022-12-01 LAB — RETIC PANEL
Immature Retic Fract: 9.3 % (ref 2.3–15.9)
RBC.: 3.78 MIL/uL — ABNORMAL LOW (ref 4.22–5.81)
Retic Count, Absolute: 105 10*3/uL (ref 19.0–186.0)
Retic Ct Pct: 2.8 % (ref 0.4–3.1)
Reticulocyte Hemoglobin: 31.3 pg (ref >27.9)

## 2022-12-01 LAB — IRON AND TIBC
Iron: 92 ug/dL (ref 45–182)
Saturation Ratios: 28 % (ref 17.9–39.5)
TIBC: 328 ug/dL (ref 250–450)
UIBC: 236 ug/dL

## 2022-12-01 LAB — FERRITIN: Ferritin: 236 ng/mL (ref 24–336)

## 2022-12-01 MED ORDER — SODIUM CHLORIDE 0.9 % IV SOLN
200.0000 mg | Freq: Once | INTRAVENOUS | Status: AC
  Administered 2022-12-01: 200 mg via INTRAVENOUS
  Filled 2022-12-01: qty 200

## 2022-12-01 MED ORDER — SODIUM CHLORIDE 0.9 % IV SOLN
Freq: Once | INTRAVENOUS | Status: AC
  Filled 2022-12-01: qty 250

## 2022-12-01 NOTE — Telephone Encounter (Signed)
Home Health Verbal Orders - Caller/Agency: [facility]  Callback Number: [phone] Requesting OT Frequency: 1x4

## 2022-12-02 ENCOUNTER — Telehealth: Payer: Self-pay

## 2022-12-02 NOTE — Telephone Encounter (Signed)
Stephanie with OT [facility]- returned call with the okay to proceed. [phone]

## 2022-12-03 DIAGNOSIS — I130 Hypertensive heart and chronic kidney disease with heart failure and stage 1 through stage 4 chronic kidney disease, or unspecified chronic kidney disease: Secondary | ICD-10-CM | POA: Diagnosis not present

## 2022-12-03 DIAGNOSIS — K922 Gastrointestinal hemorrhage, unspecified: Secondary | ICD-10-CM | POA: Diagnosis not present

## 2022-12-03 DIAGNOSIS — E1122 Type 2 diabetes mellitus with diabetic chronic kidney disease: Secondary | ICD-10-CM | POA: Diagnosis not present

## 2022-12-03 DIAGNOSIS — Q2730 Arteriovenous malformation, site unspecified: Secondary | ICD-10-CM | POA: Diagnosis not present

## 2022-12-03 DIAGNOSIS — I5033 Acute on chronic diastolic (congestive) heart failure: Secondary | ICD-10-CM | POA: Diagnosis not present

## 2022-12-03 DIAGNOSIS — D500 Iron deficiency anemia secondary to blood loss (chronic): Secondary | ICD-10-CM | POA: Diagnosis not present

## 2022-12-06 DIAGNOSIS — I5033 Acute on chronic diastolic (congestive) heart failure: Secondary | ICD-10-CM | POA: Diagnosis not present

## 2022-12-06 DIAGNOSIS — Q2730 Arteriovenous malformation, site unspecified: Secondary | ICD-10-CM | POA: Diagnosis not present

## 2022-12-06 DIAGNOSIS — E1122 Type 2 diabetes mellitus with diabetic chronic kidney disease: Secondary | ICD-10-CM | POA: Diagnosis not present

## 2022-12-06 DIAGNOSIS — K922 Gastrointestinal hemorrhage, unspecified: Secondary | ICD-10-CM | POA: Diagnosis not present

## 2022-12-06 DIAGNOSIS — D500 Iron deficiency anemia secondary to blood loss (chronic): Secondary | ICD-10-CM | POA: Diagnosis not present

## 2022-12-06 DIAGNOSIS — I130 Hypertensive heart and chronic kidney disease with heart failure and stage 1 through stage 4 chronic kidney disease, or unspecified chronic kidney disease: Secondary | ICD-10-CM | POA: Diagnosis not present

## 2022-12-07 ENCOUNTER — Other Ambulatory Visit: Payer: Self-pay | Admitting: Neurology

## 2022-12-07 ENCOUNTER — Inpatient Hospital Stay: Payer: Medicare Other

## 2022-12-07 VITALS — BP 140/62 | HR 73 | Temp 98.5°F | Resp 17

## 2022-12-07 DIAGNOSIS — N1831 Chronic kidney disease, stage 3a: Secondary | ICD-10-CM | POA: Diagnosis not present

## 2022-12-07 DIAGNOSIS — D500 Iron deficiency anemia secondary to blood loss (chronic): Secondary | ICD-10-CM | POA: Diagnosis not present

## 2022-12-07 DIAGNOSIS — D631 Anemia in chronic kidney disease: Secondary | ICD-10-CM | POA: Diagnosis not present

## 2022-12-07 DIAGNOSIS — D649 Anemia, unspecified: Secondary | ICD-10-CM

## 2022-12-07 MED ORDER — SODIUM CHLORIDE 0.9 % IV SOLN
Freq: Once | INTRAVENOUS | Status: AC
  Filled 2022-12-07: qty 250

## 2022-12-07 MED ORDER — SODIUM CHLORIDE 0.9 % IV SOLN
200.0000 mg | Freq: Once | INTRAVENOUS | Status: AC
  Administered 2022-12-07: 200 mg via INTRAVENOUS
  Filled 2022-12-07: qty 200

## 2022-12-08 ENCOUNTER — Inpatient Hospital Stay: Payer: Medicare Other

## 2022-12-08 DIAGNOSIS — D500 Iron deficiency anemia secondary to blood loss (chronic): Secondary | ICD-10-CM | POA: Diagnosis not present

## 2022-12-08 DIAGNOSIS — I130 Hypertensive heart and chronic kidney disease with heart failure and stage 1 through stage 4 chronic kidney disease, or unspecified chronic kidney disease: Secondary | ICD-10-CM | POA: Diagnosis not present

## 2022-12-08 DIAGNOSIS — I5033 Acute on chronic diastolic (congestive) heart failure: Secondary | ICD-10-CM | POA: Diagnosis not present

## 2022-12-08 DIAGNOSIS — Q2730 Arteriovenous malformation, site unspecified: Secondary | ICD-10-CM | POA: Diagnosis not present

## 2022-12-08 DIAGNOSIS — K922 Gastrointestinal hemorrhage, unspecified: Secondary | ICD-10-CM | POA: Diagnosis not present

## 2022-12-08 DIAGNOSIS — E1122 Type 2 diabetes mellitus with diabetic chronic kidney disease: Secondary | ICD-10-CM | POA: Diagnosis not present

## 2022-12-09 DIAGNOSIS — I130 Hypertensive heart and chronic kidney disease with heart failure and stage 1 through stage 4 chronic kidney disease, or unspecified chronic kidney disease: Secondary | ICD-10-CM | POA: Diagnosis not present

## 2022-12-09 DIAGNOSIS — K922 Gastrointestinal hemorrhage, unspecified: Secondary | ICD-10-CM | POA: Diagnosis not present

## 2022-12-09 DIAGNOSIS — Q2730 Arteriovenous malformation, site unspecified: Secondary | ICD-10-CM | POA: Diagnosis not present

## 2022-12-09 DIAGNOSIS — E1122 Type 2 diabetes mellitus with diabetic chronic kidney disease: Secondary | ICD-10-CM | POA: Diagnosis not present

## 2022-12-09 DIAGNOSIS — D500 Iron deficiency anemia secondary to blood loss (chronic): Secondary | ICD-10-CM | POA: Diagnosis not present

## 2022-12-09 DIAGNOSIS — I5033 Acute on chronic diastolic (congestive) heart failure: Secondary | ICD-10-CM | POA: Diagnosis not present

## 2022-12-10 ENCOUNTER — Ambulatory Visit (INDEPENDENT_AMBULATORY_CARE_PROVIDER_SITE_OTHER): Payer: Medicare Other | Admitting: Family Medicine

## 2022-12-10 ENCOUNTER — Encounter: Payer: Self-pay | Admitting: Family Medicine

## 2022-12-10 VITALS — BP 116/64 | HR 77 | Ht 70.0 in | Wt 211.0 lb

## 2022-12-10 DIAGNOSIS — K922 Gastrointestinal hemorrhage, unspecified: Secondary | ICD-10-CM

## 2022-12-10 DIAGNOSIS — D500 Iron deficiency anemia secondary to blood loss (chronic): Secondary | ICD-10-CM | POA: Diagnosis not present

## 2022-12-10 NOTE — Progress Notes (Signed)
Date:  12/10/2022   Name:  Jeremy [name]   DOB:  November 18, 1950   MRN:  [id number]   Chief Complaint: Hospitalization Follow-up (11/24/22 - TOC on 11/26/22)  Follow up Hospitalization  Patient was admitted to [facility] on 1/31 and discharged on .11/26/22 He was treated for Gi bleed with resultant anemia. Treatment for this included transfusion. Telephone follow up was done on 11/26/22 He reports excellent compliance with treatment. He reports this condition is improved.  ----------------------------------------------------------------------------------------- -     [facility] AT [facility] Discharge Acute Issues Care Follow Up                                                                        Patient Demographics  Jeremy [name], is a 72 y.o. male  DOB 10/07/1951  MRN [id number].  Primary MD  Juline Patch, MD   Reason for TCC follow Up - followup anemia/gi bleed   Past Medical History:  Diagnosis Date   Chronic pain    Depression    Diabetes ([place])    GERD (gastroesophageal reflux disease)    Hyperlipemia    Hypertension    MS (multiple sclerosis) ([place])     Past Surgical History:  Procedure Laterality Date   COLECTOMY  06-2008   COLONOSCOPY  2015   normal   COLONOSCOPY WITH PROPOFOL N/A 09/10/2021   Procedure: COLONOSCOPY WITH PROPOFOL;  Surgeon: [name], MD;  Location: [facility] ENDOSCOPY;  Service: Endoscopy;  Laterality: N/A;   ESOPHAGOGASTRODUODENOSCOPY (EGD) WITH PROPOFOL N/A 09/10/2021   Procedure: ESOPHAGOGASTRODUODENOSCOPY (EGD) WITH PROPOFOL;  Surgeon: [name], MD;  Location: [facility] ENDOSCOPY;  Service: Endoscopy;  Laterality: N/A;   GIVENS CAPSULE STUDY N/A 09/25/2021   Procedure: GIVENS CAPSULE STUDY;  Surgeon: Jonathon Bellows, MD;  Location: [facility] ENDOSCOPY;  Service: Gastroenterology;  Laterality: N/A;          Subjective:   Jeremy [name] today has, No headache, No chest pain, No abdominal pain - No Nausea, No new weakness tingling or numbness, No Cough - MinimalSOB. And no abdominal pain  Assessment & Plan    Reason for frequent admissions/ER visits persistent upper gi bleed      Objective:   Vitals:   12/10/22 1414  BP: 116/64  Pulse: 77  SpO2: 90%  Weight: 211 lb (95.7 kg)  Height: 5' 10"$  (1.778 m)    Wt Readings from Last 3 Encounters:  12/10/22 211 lb (95.7 kg)  11/24/22 208 lb 5.4 oz (94.5 kg)  11/16/22 220 lb (99.8 kg)    Allergies as of 12/10/2022       Reactions   Betadine [povidone Iodine]         Medication List        Accurate as of December 10, 2022  2:40 PM. If you have any questions, ask your nurse or doctor.          acetaminophen 500 MG tablet Commonly  known as: TYLENOL Take 500 mg by mouth every 6 (six) hours as needed for mild pain.   albuterol 108 (90 Base) MCG/ACT inhaler Commonly known as: VENTOLIN HFA Inhale 2 puffs into the lungs in the morning, at noon, in the evening, and at bedtime.   B-D INSULIN SYRINGE 1CC/25GX1" 25G X 1" 1 ML Misc Generic drug: Insulin Syringe-Needle U-100 USE AS DIRECTED   Betaseron 0.3 MG Kit injection Generic drug: Interferon Beta-1b Inject subcutaneously 1 syringe every other day.   cyanocobalamin 500 MCG tablet Commonly known as: VITAMIN B12 Take 1,000 mcg by mouth daily.   ferrous sulfate 325 (65 FE) MG tablet Take 325 mg by mouth daily with breakfast.   furosemide 40 MG tablet Commonly known as: LASIX Take 40 mg by mouth. 1/2 pill every other day- Singh   gemfibrozil 600 MG tablet Commonly known as: LOPID Take 1 tablet (600 mg total) by mouth 2 (two) times daily before a meal. TAKE 1 TABLET BY MOUTH TWICE  DAILY BEFORE MEALS   insulin glargine 100 UNIT/ML Solostar Pen Commonly known as: LANTUS Inject 20 Units into the skin at bedtime.   loratadine 10 MG tablet Commonly  known as: CLARITIN Take 1 tablet (10 mg total) by mouth daily.   metFORMIN 500 MG tablet Commonly known as: GLUCOPHAGE Take 500 mg by mouth 2 (two) times daily.   mupirocin ointment 2 % Commonly known as: BACTROBAN Apply 1 Application topically 2 (two) times daily. Apply to effected area bid   omeprazole 40 MG capsule Commonly known as: PRILOSEC TAKE 1 CAPSULE BY MOUTH DAILY   pregabalin 200 MG capsule Commonly known as: LYRICA Take 1 capsule (200 mg total) by mouth 2 (two) times daily.   sertraline 50 MG tablet Commonly known as: ZOLOFT TAKE ONE-HALF TABLET BY MOUTH  DAILY   simvastatin 40 MG tablet Commonly known as: ZOCOR TAKE 1 TABLET BY MOUTH DAILY   sodium bicarbonate 650 MG tablet Take 650 mg by mouth 2 (two) times daily.   traMADol 50 MG tablet Commonly known as: ULTRAM TAKE 1 TABLET BY MOUTH EVERY 6 HOURS AS NEEDED         Physical Exam: Constitutional: Patient appears well-developed and well-nourished. Not in obvious distress. HENT: Normocephalic, atraumatic, External right and left ear normal. Oropharynx is clear and moist.  Eyes: Conjunctivae and EOM are normal. PERRLA, no scleral icterus. Neck: Normal ROM. Neck supple. No JVD. No tracheal deviation. No thyromegaly. CVS: RRR, S1/S2 +, no murmurs, no gallops, no carotid bruit.  Pulmonary: Effort and breath sounds normal, no stridor, rhonchi, wheezes, rales.  Abdominal: Soft. BS +, no distension, tenderness, rebound or guarding.  Musculoskeletal: Normal range of motion. No edema and no tenderness.  Lymphadenopathy: No lymphadenopathy noted, cervical, inguinal or axillary Neuro: Alert. Normal reflexes, muscle tone coordination. No cranial nerve deficit. Skin: Skin is warm and dry. No rash noted. Not diaphoretic. No erythema. No pallor. Psychiatric: Normal mood and affect. Behavior, judgment, thought content normal.   Data Review   Micro Results No results found for this or any previous visit (from the  past 240 hour(s)).   CBC No results for input(s): "WBC", "HGB", "HCT", "PLT", "MCV", "MCH", "MCHC", "RDW", "LYMPHSABS", "MONOABS", "EOSABS", "BASOSABS", "BANDABS" in the last 168 hours.  Invalid input(s): "NEUTRABS", "BANDSABD"  Chemistries  No results for input(s): "NA", "K", "CL", "CO2", "GLUCOSE", "BUN", "CREATININE", "CALCIUM", "MG", "AST", "ALT", "ALKPHOS", "BILITOT" in the last 168 hours.  Invalid input(s): "GFRCGP" ------------------------------------------------------------------------------------------------------------------ estimated creatinine clearance is 62.8 mL/min (  A) (by C-G formula based on SCr of 1.27 mg/dL (H)). ------------------------------------------------------------------------------------------------------------------ No results for input(s): "HGBA1C" in the last 72 hours. ------------------------------------------------------------------------------------------------------------------ No results for input(s): "CHOL", "HDL", "LDLCALC", "TRIG", "CHOLHDL", "LDLDIRECT" in the last 72 hours. ------------------------------------------------------------------------------------------------------------------ No results for input(s): "TSH", "T4TOTAL", "T3FREE", "THYROIDAB" in the last 72 hours.  Invalid input(s): "FREET3" ------------------------------------------------------------------------------------------------------------------ No results for input(s): "VITAMINB12", "FOLATE", "FERRITIN", "TIBC", "IRON", "RETICCTPCT" in the last 72 hours.  Coagulation profile No results for input(s): "INR", "PROTIME" in the last 168 hours.  No results for input(s): "DDIMER" in the last 72 hours.  Cardiac Enzymes No results for input(s): "CKMB", "TROPONINI", "MYOGLOBIN" in the last 168 hours.  Invalid input(s): "CK" ------------------------------------------------------------------------------------------------------------------ Invalid input(s): "POCBNP"   .   Otilio Miu M.D on 12/10/2022 at 2:40 PM      Lab Results  Component Value Date   NA 140 11/26/2022   K 4.0 11/26/2022   CO2 31 11/26/2022   GLUCOSE 92 11/26/2022   BUN 28 (H) 11/26/2022   CREATININE 1.27 (H) 11/26/2022   CALCIUM 8.4 (L) 11/26/2022   EGFR 53 (L) 02/15/2022   GFRNONAA >60 11/26/2022   Lab Results  Component Value Date   CHOL 103 07/10/2021   HDL 22 (L) 07/10/2021   LDLCALC 35 07/10/2021   TRIG 228 (H) 07/10/2021   CHOLHDL 4.7 07/10/2021   Lab Results  Component Value Date   TSH 1.90 03/30/2021   Lab Results  Component Value Date   HGBA1C 4.4 (L) 11/24/2022   Lab Results  Component Value Date   WBC 5.0 12/01/2022   HGB 10.0 (L) 12/01/2022   HCT 32.8 (L) 12/01/2022   MCV 91.4 12/01/2022   PLT 313 12/01/2022   Lab Results  Component Value Date   ALT 10 08/18/2022   AST 20 08/18/2022   ALKPHOS 61 08/18/2022   BILITOT 0.5 08/18/2022   No results found for: "25OHVITD2", "25OHVITD3", "VD25OH"   Review of Systems  Constitutional:  Positive for fatigue. Negative for chills and fever.  HENT:  Negative for sore throat and trouble swallowing.   Respiratory:  Positive for shortness of breath. Negative for cough and wheezing.   Cardiovascular:  Negative for chest pain, palpitations and leg swelling.  Gastrointestinal:  Negative for abdominal pain, blood in stool, constipation, diarrhea and nausea.  Endocrine: Negative for polydipsia and polyuria.  Genitourinary:  Negative for difficulty urinating, hematuria and urgency.  Musculoskeletal:  Negative for back pain, myalgias and neck pain.  Skin:  Negative for rash.  Allergic/Immunologic: Negative for environmental allergies.  Neurological:  Negative for dizziness.  Hematological:  Does not bruise/bleed easily.  Psychiatric/Behavioral:  Negative for suicidal ideas. The patient is not nervous/anxious.     Patient Active Problem List   Diagnosis Date Noted   GI bleed 11/24/2022   Arteriovenous malformation  (AVM) 10/21/2022   Weakness 08/18/2022   AKI (acute kidney injury) ([place]) 08/18/2022   Severe sepsis ([place]) 02/04/2022   Pneumonia 02/03/2022   Acute on chronic respiratory failure with hypoxia ([place]) 02/03/2022   Hyperkalemia 02/03/2022   COPD (chronic obstructive pulmonary disease) ([place]) 02/03/2022   SIRS (systemic inflammatory response syndrome), possible sepsis ([place]) 02/03/2022   Anemia due to blood loss 09/24/2021   Gastric polyp    Gastritis without bleeding    Symptomatic anemia 06/19/2021   Iron deficiency anemia due to chronic blood loss 06/19/2021   Gait abnormality 05/07/2021   Stage 3a chronic kidney disease ([place]) 05/20/2020   DM type 2 with diabetic peripheral neuropathy ([place])  05/06/2020   Idiopathic peripheral neuropathy 01/15/2020   Generalized edema 01/15/2020   Primary pulmonary hypertension ([place]) 10/28/2019   Therapeutic drug monitoring 03/23/2018   Mild episode of recurrent major depressive disorder ([place]) 09/26/2017   Ventricular ectopic beats 09/26/2017   Type 2 diabetes mellitus with diabetic polyneuropathy, with long-term current use of insulin ([place]) 08/12/2017   Hyperlipidemia due to type 2 diabetes mellitus ([place]) 08/12/2017   B12 deficiency 12/14/2016   Low back pain 03/10/2016   Lumbosacral disc disease 01/01/2016   Neuropathy associated with endocrine disorder ([place]) 11/19/2015   Iron deficiency anemia 06/03/2015   Essential hypertension 06/03/2015   Hyperlipidemia 06/03/2015   Depression 06/03/2015   Gastroesophageal reflux disease without esophagitis 06/03/2015   Edema extremities 06/03/2015   Multiple sclerosis ([place]) 07/26/2013   Abnormality of gait 07/26/2013   Morbid obesity ([place]) 07/26/2013    Allergies  Allergen Reactions   Betadine [Povidone Iodine]     Past Surgical History:  Procedure Laterality Date   COLECTOMY  06-2008   COLONOSCOPY  2015   normal   COLONOSCOPY WITH PROPOFOL N/A 09/10/2021   Procedure: COLONOSCOPY WITH PROPOFOL;   Surgeon: [name], MD;  Location: [facility] ENDOSCOPY;  Service: Endoscopy;  Laterality: N/A;   ESOPHAGOGASTRODUODENOSCOPY (EGD) WITH PROPOFOL N/A 09/10/2021   Procedure: ESOPHAGOGASTRODUODENOSCOPY (EGD) WITH PROPOFOL;  Surgeon: [name], MD;  Location: [facility] ENDOSCOPY;  Service: Endoscopy;  Laterality: N/A;   GIVENS CAPSULE STUDY N/A 09/25/2021   Procedure: GIVENS CAPSULE STUDY;  Surgeon: Jonathon Bellows, MD;  Location: [facility] ENDOSCOPY;  Service: Gastroenterology;  Laterality: N/A;    Social History   Tobacco Use   Smoking status: Former    Packs/day: 1.50    Years: 30.00    Total pack years: 45.00    Types: Cigarettes    Quit date: 01/23/2006    Years since quitting: 16.8   Smokeless tobacco: Never   Tobacco comments:    N/A  Vaping Use   Vaping Use: Never used  Substance Use Topics   Alcohol use: Not Currently    Comment: rare; maybe 2 beers a year   Drug use: No     Medication list has been reviewed and updated.  Current Meds  Medication Sig   acetaminophen (TYLENOL) 500 MG tablet Take 500 mg by mouth every 6 (six) hours as needed for mild pain.   albuterol (VENTOLIN HFA) 108 (90 Base) MCG/ACT inhaler Inhale 2 puffs into the lungs in the morning, at noon, in the evening, and at bedtime.   B-D INSULIN SYRINGE 1CC/25GX1" 25G X 1" 1 ML MISC USE AS DIRECTED   cyanocobalamin (VITAMIN B12) 500 MCG tablet Take 1,000 mcg by mouth daily.   ferrous sulfate 325 (65 FE) MG tablet Take 325 mg by mouth daily with breakfast.   furosemide (LASIX) 40 MG tablet Take 40 mg by mouth. 1/2 pill every other day- Singh   gemfibrozil (LOPID) 600 MG tablet Take 1 tablet (600 mg total) by mouth 2 (two) times daily before a meal. TAKE 1 TABLET BY MOUTH TWICE  DAILY BEFORE MEALS   insulin glargine (LANTUS) 100 UNIT/ML Solostar Pen Inject 20 Units into the skin at bedtime.   Interferon Beta-1b (BETASERON) 0.3 MG KIT injection Inject subcutaneously 1 syringe every other day.   loratadine (CLARITIN) 10 MG  tablet Take 1 tablet (10 mg total) by mouth daily.   metFORMIN (GLUCOPHAGE) 500 MG tablet Take 500 mg by mouth 2 (two) times daily.   mupirocin ointment (BACTROBAN) 2 % Apply  1 Application topically 2 (two) times daily. Apply to effected area bid   omeprazole (PRILOSEC) 40 MG capsule TAKE 1 CAPSULE BY MOUTH DAILY   pregabalin (LYRICA) 200 MG capsule Take 1 capsule (200 mg total) by mouth 2 (two) times daily.   sertraline (ZOLOFT) 50 MG tablet TAKE ONE-HALF TABLET BY MOUTH  DAILY   simvastatin (ZOCOR) 40 MG tablet TAKE 1 TABLET BY MOUTH DAILY   sodium bicarbonate 650 MG tablet Take 650 mg by mouth 2 (two) times daily.   traMADol (ULTRAM) 50 MG tablet TAKE 1 TABLET BY MOUTH EVERY 6 HOURS AS NEEDED       12/10/2022    2:19 PM 11/16/2022    3:56 PM 07/02/2022    1:33 PM 02/15/2022    1:36 PM  GAD 7 : Generalized Anxiety Score  Nervous, Anxious, on Edge 0 0 0 0  Control/stop worrying 0 0 0 0  Worry too much - different things 0 0 0 0  Trouble relaxing 0 0 0 0  Restless 0 0 0 0  Easily annoyed or irritable 0 0 0 0  Afraid - awful might happen 0 0 0 0  Total GAD 7 Score 0 0 0 0  Anxiety Difficulty Not difficult at all Not difficult at all Not difficult at all Not difficult at all       12/10/2022    2:19 PM 11/16/2022    3:56 PM 07/02/2022    1:33 PM  Depression screen PHQ 2/9  Decreased Interest 0 0 0  Down, Depressed, Hopeless 0 0 0  PHQ - 2 Score 0 0 0  Altered sleeping 0 0 0  Tired, decreased energy 0 0 0  Change in appetite 0 0 0  Feeling bad or failure about yourself  0 0 0  Trouble concentrating 0 0 0  Moving slowly or fidgety/restless 0 0 0  Suicidal thoughts 0 0 0  PHQ-9 Score 0 0 0  Difficult doing work/chores Not difficult at all Not difficult at all Not difficult at all    BP Readings from Last 3 Encounters:  12/10/22 116/64  12/07/22 (!) 140/62  12/01/22 (!) 128/57    Physical Exam Vitals and nursing note reviewed.  HENT:     Right Ear: Tympanic membrane and ear  canal normal.     Left Ear: Tympanic membrane and ear canal normal.     Mouth/Throat:     Mouth: Mucous membranes are moist.  Eyes:     Pupils: Pupils are equal, round, and reactive to light.  Cardiovascular:     Heart sounds: Normal heart sounds. No murmur heard.    No friction rub. No gallop.  Pulmonary:     Breath sounds: No wheezing, rhonchi or rales.  Abdominal:     Palpations: There is no hepatomegaly or splenomegaly.     Tenderness: There is no abdominal tenderness. There is no guarding or rebound.  Musculoskeletal:     Cervical back: Normal range of motion.  Skin:    Capillary Refill: Capillary refill takes more than 3 seconds.     Wt Readings from Last 3 Encounters:  12/10/22 211 lb (95.7 kg)  11/24/22 208 lb 5.4 oz (94.5 kg)  11/16/22 220 lb (99.8 kg)    BP 116/64   Pulse 77   Ht 5' 10"$  (1.778 m)   Wt 211 lb (95.7 kg)   SpO2 90%   BMI 30.28 kg/m   Assessment and Plan:  1. Gastrointestinal hemorrhage, unspecified gastrointestinal  hemorrhage type Status post hospitalization for upper GI bleed of the small intestine origin.  Patient is having a double tube endoscopy at [facility] for upcoming evaluation of the source of bleeding.  2. Anemia due to blood loss Currently stable and is followed by oncology hematology and patient has upcoming appointment at [facility] for evaluation of GI tract.  Status post hospitalization for upper GI bleed of a small intestinal origin patient is having double tube endoscopy upcoming for evaluation  Otilio Miu, MD

## 2022-12-13 DIAGNOSIS — Q2730 Arteriovenous malformation, site unspecified: Secondary | ICD-10-CM | POA: Diagnosis not present

## 2022-12-13 DIAGNOSIS — K922 Gastrointestinal hemorrhage, unspecified: Secondary | ICD-10-CM | POA: Diagnosis not present

## 2022-12-13 DIAGNOSIS — E1122 Type 2 diabetes mellitus with diabetic chronic kidney disease: Secondary | ICD-10-CM | POA: Diagnosis not present

## 2022-12-13 DIAGNOSIS — I130 Hypertensive heart and chronic kidney disease with heart failure and stage 1 through stage 4 chronic kidney disease, or unspecified chronic kidney disease: Secondary | ICD-10-CM | POA: Diagnosis not present

## 2022-12-13 DIAGNOSIS — I5033 Acute on chronic diastolic (congestive) heart failure: Secondary | ICD-10-CM | POA: Diagnosis not present

## 2022-12-13 DIAGNOSIS — D500 Iron deficiency anemia secondary to blood loss (chronic): Secondary | ICD-10-CM | POA: Diagnosis not present

## 2022-12-14 ENCOUNTER — Encounter: Payer: Self-pay | Admitting: *Deleted

## 2022-12-14 ENCOUNTER — Inpatient Hospital Stay: Payer: Medicare Other

## 2022-12-14 ENCOUNTER — Telehealth: Payer: Self-pay | Admitting: *Deleted

## 2022-12-14 VITALS — BP 154/64 | HR 74 | Temp 98.9°F | Resp 18

## 2022-12-14 DIAGNOSIS — D500 Iron deficiency anemia secondary to blood loss (chronic): Secondary | ICD-10-CM

## 2022-12-14 DIAGNOSIS — D631 Anemia in chronic kidney disease: Secondary | ICD-10-CM | POA: Diagnosis not present

## 2022-12-14 DIAGNOSIS — D649 Anemia, unspecified: Secondary | ICD-10-CM

## 2022-12-14 DIAGNOSIS — N1831 Chronic kidney disease, stage 3a: Secondary | ICD-10-CM | POA: Diagnosis not present

## 2022-12-14 LAB — CBC WITH DIFFERENTIAL/PLATELET
Abs Immature Granulocytes: 0.02 10*3/uL (ref 0.00–0.07)
Basophils Absolute: 0 10*3/uL (ref 0.0–0.1)
Basophils Relative: 1 %
Eosinophils Absolute: 0.3 10*3/uL (ref 0.0–0.5)
Eosinophils Relative: 7 %
HCT: 30.6 % — ABNORMAL LOW (ref 39.0–52.0)
Hemoglobin: 9.5 g/dL — ABNORMAL LOW (ref 13.0–17.0)
Immature Granulocytes: 0 %
Lymphocytes Relative: 14 %
Lymphs Abs: 0.7 10*3/uL (ref 0.7–4.0)
MCH: 29.3 pg (ref 26.0–34.0)
MCHC: 31 g/dL (ref 30.0–36.0)
MCV: 94.4 fL (ref 80.0–100.0)
Monocytes Absolute: 0.4 10*3/uL (ref 0.1–1.0)
Monocytes Relative: 7 %
Neutro Abs: 3.3 10*3/uL (ref 1.7–7.7)
Neutrophils Relative %: 71 %
Platelets: 225 10*3/uL (ref 150–400)
RBC: 3.24 MIL/uL — ABNORMAL LOW (ref 4.22–5.81)
RDW: 22.2 % — ABNORMAL HIGH (ref 11.5–15.5)
WBC: 4.7 10*3/uL (ref 4.0–10.5)
nRBC: 0 % (ref 0.0–0.2)

## 2022-12-14 MED ORDER — SODIUM CHLORIDE 0.9 % IV SOLN
INTRAVENOUS | Status: DC | PRN
  Filled 2022-12-14: qty 250

## 2022-12-14 MED ORDER — SODIUM CHLORIDE 0.9 % IV SOLN
200.0000 mg | Freq: Once | INTRAVENOUS | Status: AC
  Administered 2022-12-14: 200 mg via INTRAVENOUS
  Filled 2022-12-14: qty 200

## 2022-12-14 NOTE — Progress Notes (Signed)
Patient declined to wait the 30 minutes for post iron infusion observation today. Patient verbalized understanding of iron related signs and symptoms of reaction, and follow up instructions were given to patient.

## 2022-12-14 NOTE — Patient Outreach (Signed)
  Care Coordination   Initial Visit Note   12/14/2022 Name: Jeremy [name] MRN: [id number] DOB: 1950/11/04  Jeremy [name] is a 72 y.o. year old male who sees Juline Patch, MD for primary care. I spoke with  [name] by phone today.  What matters to the patients health and wellness today?  Preparing for GI procedure to find out why he has recurrent bleeding.  Active with [facility] for PT/OT.     Goals Addressed             This Visit's Progress    Resolution of reurrent GI bleed       Care Coordination Interventions: Evaluation of current treatment plan related to symptomatic anemia/recurrent GI bleed and patient's adherence to plan as established by provider Provided education to patient re: double tube endoscopy Reviewed medications with patient and discussed affordability and adherence Reviewed scheduled/upcoming provider appointments including GI procedure for 3/18. Cancer center on 2/20, pain management on 2/22, and PCP on 3/8  Discussed plans with patient for ongoing care management follow up and provided patient with direct contact information for care management team Screening for signs and symptoms of depression related to chronic disease state  Assessed social determinant of health barriers         SDOH assessments and interventions completed:  Yes  SDOH Interventions Today    Flowsheet Row Most Recent Value  SDOH Interventions   Food Insecurity Interventions Intervention Not Indicated  Housing Interventions Intervention Not Indicated  Transportation Interventions Intervention Not Indicated        Care Coordination Interventions:  Yes, provided   Follow up plan: Follow up call scheduled for 3/22    Encounter Outcome:  Pt. Visit Completed   Valente David, RN, MSN, [place] Care Management Care Management Coordinator [phone]

## 2022-12-15 ENCOUNTER — Ambulatory Visit: Payer: Medicare Other

## 2022-12-15 ENCOUNTER — Other Ambulatory Visit: Payer: Medicare Other

## 2022-12-15 DIAGNOSIS — K922 Gastrointestinal hemorrhage, unspecified: Secondary | ICD-10-CM | POA: Diagnosis not present

## 2022-12-15 DIAGNOSIS — Q2730 Arteriovenous malformation, site unspecified: Secondary | ICD-10-CM | POA: Diagnosis not present

## 2022-12-15 DIAGNOSIS — E1122 Type 2 diabetes mellitus with diabetic chronic kidney disease: Secondary | ICD-10-CM | POA: Diagnosis not present

## 2022-12-15 DIAGNOSIS — I5033 Acute on chronic diastolic (congestive) heart failure: Secondary | ICD-10-CM | POA: Diagnosis not present

## 2022-12-15 DIAGNOSIS — I130 Hypertensive heart and chronic kidney disease with heart failure and stage 1 through stage 4 chronic kidney disease, or unspecified chronic kidney disease: Secondary | ICD-10-CM | POA: Diagnosis not present

## 2022-12-15 DIAGNOSIS — D500 Iron deficiency anemia secondary to blood loss (chronic): Secondary | ICD-10-CM | POA: Diagnosis not present

## 2022-12-16 ENCOUNTER — Ambulatory Visit
Payer: Medicare Other | Attending: Student in an Organized Health Care Education/Training Program | Admitting: Student in an Organized Health Care Education/Training Program

## 2022-12-16 ENCOUNTER — Encounter: Payer: Self-pay | Admitting: Student in an Organized Health Care Education/Training Program

## 2022-12-16 VITALS — BP 134/49 | HR 73 | Temp 97.9°F | Resp 16 | Ht 70.0 in | Wt 212.0 lb

## 2022-12-16 DIAGNOSIS — E1142 Type 2 diabetes mellitus with diabetic polyneuropathy: Secondary | ICD-10-CM | POA: Diagnosis not present

## 2022-12-16 DIAGNOSIS — Z794 Long term (current) use of insulin: Secondary | ICD-10-CM | POA: Diagnosis not present

## 2022-12-16 DIAGNOSIS — R531 Weakness: Secondary | ICD-10-CM | POA: Insufficient documentation

## 2022-12-16 DIAGNOSIS — E1140 Type 2 diabetes mellitus with diabetic neuropathy, unspecified: Secondary | ICD-10-CM | POA: Diagnosis not present

## 2022-12-16 DIAGNOSIS — N1831 Chronic kidney disease, stage 3a: Secondary | ICD-10-CM | POA: Diagnosis not present

## 2022-12-16 DIAGNOSIS — K219 Gastro-esophageal reflux disease without esophagitis: Secondary | ICD-10-CM | POA: Diagnosis not present

## 2022-12-16 DIAGNOSIS — G35 Multiple sclerosis: Secondary | ICD-10-CM | POA: Insufficient documentation

## 2022-12-16 DIAGNOSIS — G894 Chronic pain syndrome: Secondary | ICD-10-CM | POA: Diagnosis not present

## 2022-12-16 NOTE — Progress Notes (Signed)
Safety precautions to be maintained throughout the outpatient stay will include: orient to surroundings, keep bed in low position, maintain call bell within reach at all times, provide assistance with transfer out of bed and ambulation.   Patient here today for medication management of Tramadol and Lyrica.  His neurologist no longer wants to prescribe the tramadol and his primary does not want to prescribe Lyrica.  He has Multiple Sclerosis and is affecting the left side the most.

## 2022-12-16 NOTE — Progress Notes (Signed)
Patient: Jeremy [name]  Service Category: E/M  Provider: Gillis Santa, MD  DOB: [birth year]  DOS: 12/16/2022  Referring Provider: Suzzanne Cloud, NP  MRN: [id number]  Setting: Ambulatory outpatient  PCP: Juline Patch, MD  Type: New Patient  Specialty: Interventional Pain Management    Location: Office  Delivery: Face-to-face     Primary Reason(s) for Visit: Encounter for initial evaluation of one or more chronic problems (new to examiner) potentially causing chronic pain, and posing a threat to normal musculoskeletal function. (Level of risk: High) CC: Foot Burn (Bilateral peripheral neuropathy ), Leg Pain (Left ), and Back Pain (Lumbar, intermittently )  HPI  Jeremy [name] is a 72 y.o. year old, male patient, who comes for the first time to our practice referred by Suzzanne Cloud, NP for our initial evaluation of his chronic pain. He has Multiple sclerosis ([place]); Abnormality of gait; Morbid obesity ([place]); Iron deficiency anemia; Essential hypertension; Hyperlipidemia; Depression; Gastroesophageal reflux disease without esophagitis; Edema extremities; Neuropathy associated with endocrine disorder ([place]); Lumbosacral disc disease; Low back pain; B12 deficiency; Type 2 diabetes mellitus with diabetic polyneuropathy, with long-term current use of insulin ([place]); Mild episode of recurrent major depressive disorder ([place]); Ventricular ectopic beats; Therapeutic drug monitoring; Hyperlipidemia due to type 2 diabetes mellitus ([place]); Primary pulmonary hypertension ([place]); Idiopathic peripheral neuropathy; Generalized edema; DM type 2 with diabetic peripheral neuropathy ([place]); Stage 3a chronic kidney disease ([place]); Gait abnormality; Symptomatic anemia; Iron deficiency anemia due to chronic blood loss; Gastric polyp; Gastritis without bleeding; Anemia due to blood loss; Pneumonia; Acute on chronic respiratory failure with hypoxia ([place]); Hyperkalemia; COPD (chronic obstructive pulmonary disease) (HCC); SIRS (systemic  inflammatory response syndrome), possible sepsis ([place]); Severe sepsis ([place]); Weakness; AKI (acute kidney injury) ([place]); Arteriovenous malformation (AVM); and GI bleed on their problem list. Today he comes in for evaluation of his Foot Burn (Bilateral peripheral neuropathy ), Leg Pain (Left ), and Back Pain (Lumbar, intermittently )  Pain Assessment: Location: Left, Right Foot (left leg) Radiating: denies Onset: More than a month ago Duration: Chronic pain Quality: Discomfort, Constant, Nagging, Dull Severity: 3 /10 (subjective, self-reported pain score)  Effect on ADL: sleep disruption,  unable to ambulate without walker. Timing: Constant Modifying factors: medications BP: (!) 134/49  HR: 73  Onset and Duration: Date of onset: 1998 Cause of pain:  MS Severity: Getting worse and NAS-11 on the average: 7/10 Timing: Night and After activity or exercise Aggravating Factors: Bending, Walking uphill, and Walking downhill Alleviating Factors: Medications Associated Problems: Depression, Fatigue, Numbness, Swelling, Tingling, Weakness, and Pain that does not allow patient to sleep Quality of Pain: Constant, Shooting, Throbbing, Tingling, and Uncomfortable Previous Examinations or Tests: CT scan, MRI scan, and X-rays Previous Treatments: Narcotic medications  Jeremy [name] is a pleasant 72 year old male who presents with generalized weakness, deconditioning and chronic pain.  He has a history of multiple sclerosis.  He also has a history of insulin-dependent diabetes.  He has gait difficulties.  He also has diabetic peripheral neuropathy.  He ambulates with a rolling walker.  He has a wheelchair when he goes out and is present in a wheelchair today.  He is on Betaseron for MS management.  He is on continuous nasal oxygen.  He gets iron infusions.  History of GI bleeds as well.  He is being referred from his neurologist for management of his tramadol and Lyrica.  He was a former smoker but no longer  smokes.  Historic Controlled Substance Pharmacotherapy Review  PMP and historical  list of controlled substances: Tramadol 50 mg every 6 hours as needed, quantity 120/month; MME equals 40 Historical Monitoring: The patient  reports no history of drug use. List of prior UDS Testing: No results found for: "MDMA", "COCAINSCRNUR", "PCPSCRNUR", "PCPQUANT", "CANNABQUANT", "THCU", "ETH", "CBDTHCR", "D8THCCBX", "D9THCCBX" Historical Background Evaluation: [place] PMP: PDMP not reviewed this encounter. Review of the past 62-month conducted.              [place] Department of public safety, offender search: (Editor, commissioningInformation) Non-contributory Risk Assessment Profile: Aberrant behavior: None observed or detected today Risk factors for fatal opioid overdose: None identified today Fatal overdose hazard ratio (HR): Calculation deferred Non-fatal overdose hazard ratio (HR): Calculation deferred Risk of opioid abuse or dependence: 0.7-3.0% with doses ? 36 MME/day and 6.1-26% with doses ? 120 MME/day. Substance use disorder (SUD) risk level: Low   Pharmacologic Plan: As per protocol, I have not taken over any controlled substance management, pending the results of ordered tests and/or consults.            Initial impression: Pending review of available data and ordered tests.  Meds   Current Outpatient Medications:    acetaminophen (TYLENOL) 500 MG tablet, Take 500 mg by mouth every 6 (six) hours as needed for mild pain., Disp: , Rfl:    albuterol (VENTOLIN HFA) 108 (90 Base) MCG/ACT inhaler, Inhale 2 puffs into the lungs in the morning, at noon, in the evening, and at bedtime., Disp: , Rfl:    B-D INSULIN SYRINGE 1CC/25GX1" 25G X 1" 1 ML MISC, USE AS DIRECTED, Disp: 10 each, Rfl: 2   cyanocobalamin (VITAMIN B12) 500 MCG tablet, Take 1,000 mcg by mouth daily., Disp: , Rfl:    ferrous sulfate 325 (65 FE) MG tablet, Take 325 mg by mouth daily with breakfast., Disp: , Rfl:    furosemide (LASIX) 40 MG tablet,  Take 40 mg by mouth. 1/2 pill every other day- Singh, Disp: , Rfl:    gemfibrozil (LOPID) 600 MG tablet, Take 1 tablet (600 mg total) by mouth 2 (two) times daily before a meal. TAKE 1 TABLET BY MOUTH TWICE  DAILY BEFORE MEALS, Disp: 180 tablet, Rfl: 0   insulin glargine (LANTUS) 100 UNIT/ML Solostar Pen, Inject 20 Units into the skin at bedtime., Disp: 15 mL, Rfl: 11   Interferon Beta-1b (BETASERON) 0.3 MG KIT injection, Inject subcutaneously 1 syringe every other day., Disp: 45 kit, Rfl: 4   loratadine (CLARITIN) 10 MG tablet, Take 1 tablet (10 mg total) by mouth daily., Disp: 90 tablet, Rfl: 1   metFORMIN (GLUCOPHAGE) 500 MG tablet, Take 500 mg by mouth 2 (two) times daily., Disp: , Rfl:    mupirocin ointment (BACTROBAN) 2 %, Apply 1 Application topically 2 (two) times daily. Apply to effected area bid, Disp: 30 g, Rfl: 1   omeprazole (PRILOSEC) 40 MG capsule, TAKE 1 CAPSULE BY MOUTH DAILY, Disp: 90 capsule, Rfl: 0   pregabalin (LYRICA) 200 MG capsule, Take 1 capsule (200 mg total) by mouth 2 (two) times daily., Disp: 60 capsule, Rfl: 3   sertraline (ZOLOFT) 50 MG tablet, TAKE ONE-HALF TABLET BY MOUTH  DAILY, Disp: 45 tablet, Rfl: 0   simvastatin (ZOCOR) 40 MG tablet, TAKE 1 TABLET BY MOUTH DAILY, Disp: 90 tablet, Rfl: 0   sodium bicarbonate 650 MG tablet, Take 650 mg by mouth 2 (two) times daily., Disp: , Rfl:    traMADol (ULTRAM) 50 MG tablet, TAKE 1 TABLET BY MOUTH EVERY 6 HOURS AS NEEDED, Disp: 120  tablet, Rfl: 0 No current facility-administered medications for this visit.  Facility-Administered Medications Ordered in Other Visits:    0.9 %  sodium chloride infusion, , Intravenous, Continuous, Earlie Server, MD, Stopped at 11/04/22 1432  Imaging Review  Cervical Imaging: Cervical MR wo contrast: Results for orders placed during the hospital encounter of 08/15/14  MR Cervical Spine Wo Contrast  Narrative [facility] [address] [place], [place] [address] [phone]  NEUROIMAGING REPORT   STUDY DATE: 08/15/2014 PATIENT NAME: Khi Costilla DOB: January 17, 1951 MRN: [id number]  ORDERING CLINICIAN: Dr Krista Blue CLINICAL HISTORY: 67 year patient with multiple sclerosis COMPARISON FILMS: MRI Cervical spine 06/02/2012 EXAM: MRI Cervical spine wo TECHNIQUE: MRI of the cervical spine was obtained utilizing 3 mm sagittal slices from the posterior fossa down to the T3-4 level with T1, T2 and inversion recovery views. In addition 4 mm axial slices from [id number] down to T1-2 level were included with T2 and gradient echo views. CONTRAST: none IMAGING SITE: [facility]  FINDINGS: Altered signal intensity within the cord on the left at the C3-4 level and centrally spanning from the C5-6 to the C6-7 level. These indings are consistent with MS plaques given the patient's history.  . C2-3: Mild bulge. C3-4: Broad-based disc osteophyte complex greater to the left with minimal left-sided cord contact and flattening. Left uncinate bony overgrowth with moderate left foraminal narrowing. C4-5: Small broad-based disc osteophyte complex. Mild spinal stenosis. Minimal bilateral foraminal narrowing. C5-6: Small broad-based disc osteophyte complex. Mild spinal stenosis. No cord flattening. Minimal right-sided and mild to moderate left-sided foraminal narrowing. C6-7: Small broad-based disc osteophyte complex. Mild spinal stenosis. No cord flattening. Mild bilateral foraminal narrowing slightly greater on the right.  Impression Abnormal MRI cervical spine showing mild spondylitic changes descibed above most prominent at C 3-4 where there is moderate left formaminal narrowing. Ill defined spinal cord hypertintensities at C 3-4 to c 6-7 likely represent chronic multiple sclerosis inactive plaques. No significant change compared with MRI C spine 06/02/2012    INTERPRETING PHYSICIAN: PRAMOD SETHI, MD Certified in  Neuroimaging by [place] [facility] [employer] and [facility] for Neurological Subspecialities    [facility] [address], [place], [place] [address] [phone]  NEUROIMAGING REPORT   STUDY DATE: 08/15/2014 PATIENT NAME: Alexander Rondinelli DOB: Mar 10, 1951 MRN: [id number]  ORDERING CLINICIAN: Dr Krista Blue CLINICAL HISTORY: 69 year patient with multiple sclerosis COMPARISON FILMS: MRI Brain 06/02/2012 EXAM: MRI Brain wo TECHNIQUE: MRI of the brain without contrast was obtained utilizing 5 mm axial slices with T1, T2, T2 flair, T2 star gradient echo and diffusion weighted views.  T1 sagittal and T2 coronal views were obtained. CONTRAST: noneIMAGING SITE: [facility]  FINDINGS: The brain parenchyma shows scattered bilateral periventricular, periatrial and subcortical, corpus callosal white matter hyperintensities typical for multiple sclerosis.No abnormal lesions are seen on diffusion-weighted views to suggest acute ischemia. The cortical sulci, fissures and cisterns are normal in size and appearance. Lateral, third and fourth ventricle are normal in size and appearance. No extra-axial fluid collections are seen. No evidence of mass effect or midline shift.  On sagittal views the posterior fossa, pituitary gland and corpus callosum are unremarkable. No evidence of intracranial hemorrhage on gradient-echo views. The orbits and their contents, paranasal sinuses and calvarium are unremarkable.  Intracranial flow voids are present.      IMPRESSION:  Abnormal MRI scan of brain showing scattered periventricular, subcortical white matter hyperintensities compatible with multiple sclerosis. No significant change compared with  MRI 06/02/2012    INTERPRETING PHYSICIAN: PRAMOD SETHI, MD Certified in  Neuroimaging by [place] [employer] and [name] for Neurological Subspecialities   Narrative *RADIOLOGY REPORT*  Clinical Data: History of multiple sclerosis since 2006.  History of two falls over past  month.  Difficulty walking.  BUN and creatinine were obtained on site at [facility] at [address] Results:  BUN 26 mg/dL,  Creatinine 1.3 mg/dL.  MRI CERVICAL SPINE WITHOUT AND WITH CONTRAST  Technique:  Multiplanar and multiecho pulse sequences of the cervical spine, to include the craniocervical junction and cervicothoracic junction, were obtained according to standard protocol without and with intravenous contrast.  Contrast: 61m MULTIHANCE GADOBENATE DIMEGLUMINE 529 MG/ML IV SOLN  Comparison: None.  Findings: Motion degraded exam.  Altered signal intensity within the cord on the left at the C3-4 level and centrally spanning from the C5-6 to the C6-7 level. Findings are consistent with MS plaques given the patient's history.  These areas do not demonstrate enhancement as can be seen with active areas of demyelination.  C2-3:  Mild bulge.  C3-4:  Broad-based disc osteophyte complex greater to the left with minimal left-sided cord contact and flattening.  Left uncinate bony overgrowth with moderate left foraminal narrowing.  C4-5:  Small broad-based disc osteophyte complex.  Mild spinal stenosis.  Minimal bilateral foraminal narrowing.  C5-6:  Small broad-based disc osteophyte complex.  Mild spinal stenosis.  No cord flattening.  Minimal right-sided and mild to moderate left-sided foraminal narrowing.  C6-7:  Small broad-based disc osteophyte complex.  Mild spinal stenosis.  No cord flattening.  Mild bilateral foraminal narrowing slightly greater on the right.  C7-T1:  Negative.  IMPRESSION: Motion degraded exam.  Altered signal intensity within the cord on the left at the C3-4 level and centrally spanning from the C5-6 to the C6-7 level. Findings are consistent with MS plaques given the patient's history.  These areas do not demonstrate enhancement as can be seen with active areas of demyelination.  Cervical spondylotic changes as detailed  above.  Original Report Authenticated By: SDoug Sou M.D.   DG Lumbar Spine Complete  Narrative CLINICAL DATA:  Posterior right back pain for 1 month radiating to the right foot  EXAM: LUMBAR SPINE - COMPLETE 4+ VIEW  COMPARISON:  KUB of 07/01/2008  FINDINGS: The lumbar vertebrae are in normal alignment. There is degenerative disc disease most marked at L5-S1 but also present at L4-5 where there is loss of disc space and sclerosis with spurring. No compression deformity is seen. There is degenerative change involving the facet joints particularly of L5-S1. The SI joints are corticated. The bowel gas pattern is nonspecific.  IMPRESSION: Degenerative disc disease at L5-S1 and to a lesser degree at L4-5. Normal alignment.   Electronically Signed By: PIvar DrapeM.D. On: 01/01/2016 14:06  DG HIP UNILAT WITH PELVIS 2-3 VIEWS RIGHT  Narrative CLINICAL DATA:  Right hip pain after recent fall.  EXAM: DG HIP (WITH OR WITHOUT PELVIS) 2-3V RIGHT  COMPARISON:  None.  FINDINGS: There is no evidence of hip fracture or dislocation. There is no evidence of arthropathy or other focal bone abnormality.  IMPRESSION: Negative.   Electronically Signed By: JMarijo ConceptionM.D. On: 09/24/2021 12:19  Complexity Note: Imaging results reviewed.                         ROS  Cardiovascular: High blood pressure Pulmonary or Respiratory: Shortness of  breath and Smoking Neurological: No reported neurological signs or symptoms such as seizures, abnormal skin sensations, urinary and/or fecal incontinence, being born with an abnormal open spine and/or a tethered spinal cord Psychological-Psychiatric: Anxiousness and Depressed Gastrointestinal: No reported gastrointestinal signs or symptoms such as vomiting or evacuating blood, reflux, heartburn, alternating episodes of diarrhea and constipation, inflamed or scarred liver, or pancreas or irrregular and/or infrequent bowel  movements Genitourinary: Kidney disease Hematological: Weakness due to low blood hemoglobin or red blood cell count (Anemia), Brusing easily, and Bleeding easily Endocrine: High blood sugar controlled without the use of insulin (NIDDM) Rheumatologic: No reported rheumatological signs and symptoms such as fatigue, joint pain, tenderness, swelling, redness, heat, stiffness, decreased range of motion, with or without associated rash Musculoskeletal: Multiple sclerosis Work History: Disabled  Allergies  Jeremy [name] is allergic to betadine [povidone iodine].  Laboratory Chemistry Profile   Renal Lab Results  Component Value Date   BUN 28 (H) 11/26/2022   CREATININE 1.27 (H) 11/26/2022   BCR 23 02/15/2022   GFRAA 61 01/15/2020   GFRNONAA >60 11/26/2022   PROTEINUR NEGATIVE 08/18/2022     Electrolytes Lab Results  Component Value Date   NA 140 11/26/2022   K 4.0 11/26/2022   CL 103 11/26/2022   CALCIUM 8.4 (L) 11/26/2022   PHOS 2.8 08/23/2022     Hepatic Lab Results  Component Value Date   AST 20 08/18/2022   ALT 10 08/18/2022   ALBUMIN 2.7 (L) 08/23/2022   ALKPHOS 61 08/18/2022   AMMONIA 47 (H) 08/18/2022     ID Lab Results  Component Value Date   HIV Non Reactive 10/17/2022   SARSCOV2NAA NEGATIVE 08/18/2022     Bone No results found for: "VD25OH", "VD125OH2TOT", "[id number]", "[id number]", "25OHVITD1", "25OHVITD2", "25OHVITD3", "TESTOFREE", "TESTOSTERONE"   Endocrine Lab Results  Component Value Date   GLUCOSE 92 11/26/2022   GLUCOSEU NEGATIVE 08/18/2022   HGBA1C 4.4 (L) 11/24/2022   TSH 1.90 03/30/2021     Neuropathy Lab Results  Component Value Date   VITAMINB12 1,525 (H) 06/14/2022   FOLATE 9.3 09/24/2021   HGBA1C 4.4 (L) 11/24/2022   HIV Non Reactive 10/17/2022     CNS No results found for: "COLORCSF", "APPEARCSF", "RBCCOUNTCSF", "WBCCSF", "POLYSCSF", "LYMPHSCSF", "EOSCSF", "PROTEINCSF", "GLUCCSF", "JCVIRUS", "CSFOLI", "IGGCSF", "LABACHR", "ACETBL"    Inflammation (CRP: Acute  ESR: Chronic) Lab Results  Component Value Date   LATICACIDVEN 1.1 08/18/2022     Rheumatology No results found for: "RF", "ANA", "LABURIC", "URICUR", "LYMEIGGIGMAB", "LYMEABIGMQN", "HLAB27"   Coagulation Lab Results  Component Value Date   INR 1.3 (H) 11/24/2022   LABPROT 16.3 (H) 11/24/2022   APTT 41 (H) 08/18/2022   PLT 225 12/14/2022     Cardiovascular Lab Results  Component Value Date   BNP 2,880 (H) 08/27/2013   CKTOTAL 87 08/18/2022   TROPONINI 0.07 (H) 08/28/2013   HGB 9.5 (L) 12/14/2022   HCT 30.6 (L) 12/14/2022     Screening Lab Results  Component Value Date   SARSCOV2NAA NEGATIVE 08/18/2022   HIV Non Reactive 10/17/2022     Cancer No results found for: "CEA", "CA125", "LABCA2"   Allergens No results found for: "ALMOND", "APPLE", "ASPARAGUS", "AVOCADO", "BANANA", "BARLEY", "BASIL", "BAYLEAF", "GREENBEAN", "LIMABEAN", "WHITEBEAN", "BEEFIGE", "REDBEET", "BLUEBERRY", "BROCCOLI", "CABBAGE", "MELON", "CARROT", "CASEIN", "CASHEWNUT", "CAULIFLOWER", "CELERY"     Note: Lab results reviewed.  [place]  Drug: Jeremy [name]  reports no history of drug use. Alcohol:  reports that he does not currently use alcohol. Tobacco:  reports that he quit  smoking about 16 years ago. His smoking use included cigarettes. He has a 45.00 pack-year smoking history. He has never used smokeless tobacco. Medical:  has a past medical history of Chronic pain, Depression, Diabetes ([place]), GERD (gastroesophageal reflux disease), Hyperlipemia, Hypertension, and MS (multiple sclerosis) ([place]). Family: family history includes COPD in his brother; Diabetes in his brother; Esophageal cancer in his nephew; Heart attack in his brother and father; Lung cancer in his mother.  Past Surgical History:  Procedure Laterality Date   COLECTOMY  06-2008   COLONOSCOPY  2015   normal   COLONOSCOPY WITH PROPOFOL N/A 09/10/2021   Procedure: COLONOSCOPY WITH PROPOFOL;  Surgeon: [name],  MD;  [facility] ENDOSCOPY;  Service: Endoscopy;  Laterality: N/A;   ESOPHAGOGASTRODUODENOSCOPY (EGD) WITH PROPOFOL N/A 09/10/2021   Procedure: ESOPHAGOGASTRODUODENOSCOPY (EGD) WITH PROPOFOL;  Surgeon: [name], MD;  Location: [facility] ENDOSCOPY;  Service: Endoscopy;  Laterality: N/A;   GIVENS CAPSULE STUDY N/A 09/25/2021   Procedure: GIVENS CAPSULE STUDY;  Surgeon: Jonathon Bellows, MD;  Location[facility] ENDOSCOPY;  Service: Gastroenterology;  Laterality: N/A;   Active Ambulatory Problems    Diagnosis Date Noted   Multiple sclerosis ([place]) 07/26/2013   Abnormality of gait 07/26/2013   Morbid obesity ([place]) 07/26/2013   Iron deficiency anemia 06/03/2015   Essential hypertension 06/03/2015   Hyperlipidemia 06/03/2015   Depression 06/03/2015   Gastroesophageal reflux disease without esophagitis 06/03/2015   Edema extremities 06/03/2015   Neuropathy associated with endocrine disorder ([place]) 11/19/2015   Lumbosacral disc disease 01/01/2016   Low back pain 03/10/2016   B12 deficiency 12/14/2016   Type 2 diabetes mellitus with diabetic polyneuropathy, with long-term current use of insulin ([place]) 08/12/2017   Mild episode of recurrent major depressive disorder ([place]) 09/26/2017   Ventricular ectopic beats 09/26/2017   Therapeutic drug monitoring 03/23/2018   Hyperlipidemia due to type 2 diabetes mellitus ([place]) 08/12/2017   Primary pulmonary hypertension ([place]) 10/28/2019   Idiopathic peripheral neuropathy 01/15/2020   Generalized edema 01/15/2020   DM type 2 with diabetic peripheral neuropathy ([place]) 05/06/2020   Stage 3a chronic kidney disease ([place]) 05/20/2020   Gait abnormality 05/07/2021   Symptomatic anemia 06/19/2021   Iron deficiency anemia due to chronic blood loss 06/19/2021   Gastric polyp    Gastritis without bleeding    Anemia due to blood loss 09/24/2021   Pneumonia 02/03/2022   Acute on chronic respiratory failure with hypoxia (HCC) 02/03/2022   Hyperkalemia 02/03/2022   COPD  (chronic obstructive pulmonary disease) ([place]) 02/03/2022   SIRS (systemic inflammatory response syndrome), possible sepsis ([place]) 02/03/2022   Severe sepsis ([place]) 02/04/2022   Weakness 08/18/2022   AKI (acute kidney injury) ([place]) 08/18/2022   Arteriovenous malformation (AVM) 10/21/2022   GI bleed 11/24/2022   Resolved Ambulatory Problems    Diagnosis Date Noted   No Resolved Ambulatory Problems   Past Medical History:  Diagnosis Date   Chronic pain    Diabetes ([place])    GERD (gastroesophageal reflux disease)    Hyperlipemia    Hypertension    MS (multiple sclerosis) ([place])    Constitutional Exam  General appearance: alert, cooperative, and slowed mentation Vitals:   12/16/22 1006  BP: (!) 134/49  Pulse: 73  Resp: 16  Temp: 97.9 F (36.6 C)  TempSrc: Temporal  SpO2: 98%  Weight: 212 lb (96.2 kg)  Height: 5' 10"$  (1.778 m)   BMI Assessment: Estimated body mass index is 30.42 kg/m as calculated from the following:   Height as of this encounter:  $5' 10"v$  (1.778 m).   Weight as of this encounter: 212 lb (96.2 kg).  BMI interpretation table: BMI level Category Range association with higher incidence of chronic pain  <18 kg/m2 Underweight   18.5-24.9 kg/m2 Ideal body weight   25-29.9 kg/m2 Overweight Increased incidence by 20%  30-34.9 kg/m2 Obese (Class I) Increased incidence by 68%  35-39.9 kg/m2 Severe obesity (Class II) Increased incidence by 136%  >40 kg/m2 Extreme obesity (Class III) Increased incidence by 254%   Patient's current BMI Ideal Body weight  Body mass index is 30.42 kg/m. Ideal body weight: 73 kg (160 lb 15 oz) Adjusted ideal body weight: 82.3 kg (181 lb 5.8 oz)   BMI Readings from Last 4 Encounters:  12/16/22 30.42 kg/m  12/10/22 30.28 kg/m  11/24/22 29.89 kg/m  11/16/22 32.49 kg/m   Wt Readings from Last 4 Encounters:  12/16/22 212 lb (96.2 kg)  12/10/22 211 lb (95.7 kg)  11/24/22 208 lb 5.4 oz (94.5 kg)  11/16/22 220 lb (99.8 kg)     Psych/Mental status: Alert, oriented x 3 (person, place, & time)       Eyes: PERLA Respiratory: Oxygen-dependent COPD Patient in wheelchair, fairly deconditioned Limited lumbar spinal flexion and extension, tenderness to palpation along lower lumbar spine Mild edema of bilateral lower extremity Neuropathy of bilateral legs  4 out of 5 strength bilateral lower extremity: Plantar flexion, dorsiflexion, knee flexion, knee extension.   Assessment  Primary Diagnosis & Pertinent Problem List: The primary encounter diagnosis was Chronic pain syndrome. Diagnoses of Stage 3a chronic kidney disease ([place]), Chronic painful diabetic neuropathy ([place]), Type 2 diabetes mellitus with diabetic polyneuropathy, with long-term current use of insulin ([place]), Multiple sclerosis ([place]), Gastroesophageal reflux disease without esophagitis, and Weakness were also pertinent to this visit.  Visit Diagnosis (New problems to examiner): 1. Chronic pain syndrome   2. Stage 3a chronic kidney disease ([place])   3. Chronic painful diabetic neuropathy ([place])   4. Type 2 diabetes mellitus with diabetic polyneuropathy, with long-term current use of insulin ([place])   5. Multiple sclerosis ([place])   6. Gastroesophageal reflux disease without esophagitis   7. Weakness    Plan of Care (Initial workup plan)  Note: Jeremy [name] was reminded that as per protocol, today's visit has been an evaluation only. We have not taken over the patient's controlled substance management.    Lab Orders         Compliance Drug Analysis, Ur      Pharmacotherapy (current): Medications ordered:  No orders of the defined types were placed in this encounter.  Medications administered during this visit: [name] had no medications administered during this visit.   Analgesic Pharmacotherapy:  Opioid Analgesics: Tramadol 50 mg every 6 hours as needed, quantity 120/month.  Patient has been seeing neurology in [facility] for the last 20 years  however new nurse practitioner does not want to continue managing his tramadol, no previous compliance issues, patient is low risk.  Membrane stabilizer:   Lyrica 200 mg twice a day that PCP no longer wants to manage  Muscle relaxant: Not indicated  NSAID: Medically contraindicated history of GI bleed  Other analgesic(s): To be determined at a later time   Interventional management options: Jeremy [name] was informed that there is no guarantee that he would be a candidate for interventional therapies. The decision will be based on the results of diagnostic studies, as well as Jeremy [name]'s risk profile.  Procedure(s) under consideration:  Discussed [place] Lumbar facet medial branch  nerve blocks    Provider-requested follow-up: Return in about 19 days (around 01/04/2023) for Medication Management, in person.  Future Appointments  Date Time Provider [place]  12/22/2022  1:30 PM [facility] LAB [facility] None  12/29/2022  2:30 PM Earlie Server, MD [facility] None  12/29/2022  3:00 PM [facility] INFUSION CHAIR 2 [facility] None  12/30/2022 11:40 AM Gillis Santa, MD [facility] None  12/31/2022  1:40 PM Juline Patch, MD [facility]  01/14/2023 10:00 AM Valente David, RN [facility] None  05/03/2023  2:45 PM Suzzanne Cloud, NP [facility] None    Duration of encounter: 40mnutes.  Total time on encounter, as per AMA guidelines included both the face-to-face and non-face-to-face time personally spent by the physician and/or other qualified health care professional(s) on the day of the encounter (includes time in activities that require the physician or other qualified health care professional and does not include time in activities normally performed by clinical staff). Physician's time may include the following activities when performed: Preparing to see the patient (e.g., pre-charting review of records, searching for previously ordered imaging, lab work, and nerve conduction tests) Review of prior analgesic  pharmacotherapies. Reviewing PMP Interpreting ordered tests (e.g., lab work, imaging, nerve conduction tests) Performing post-procedure evaluations, including interpretation of diagnostic procedures Obtaining and/or reviewing separately obtained history Performing a medically appropriate examination and/or evaluation Counseling and educating the patient/family/caregiver Ordering medications, tests, or procedures Referring and communicating with other health care professionals (when not separately reported) Documenting clinical information in the electronic or other health record Independently interpreting results (not separately reported) and communicating results to the patient/ family/caregiver Care coordination (not separately reported)  Note by: BGillis Santa MD (TTS technology used. I apologize for any typographical errors that were not detected and corrected.) Date: 12/16/2022; Time: 11:06 AM

## 2022-12-17 DIAGNOSIS — Z794 Long term (current) use of insulin: Secondary | ICD-10-CM | POA: Diagnosis not present

## 2022-12-17 DIAGNOSIS — E1169 Type 2 diabetes mellitus with other specified complication: Secondary | ICD-10-CM | POA: Diagnosis not present

## 2022-12-17 DIAGNOSIS — N1831 Chronic kidney disease, stage 3a: Secondary | ICD-10-CM | POA: Diagnosis not present

## 2022-12-17 DIAGNOSIS — E785 Hyperlipidemia, unspecified: Secondary | ICD-10-CM | POA: Diagnosis not present

## 2022-12-17 DIAGNOSIS — E1142 Type 2 diabetes mellitus with diabetic polyneuropathy: Secondary | ICD-10-CM | POA: Diagnosis not present

## 2022-12-17 DIAGNOSIS — E1122 Type 2 diabetes mellitus with diabetic chronic kidney disease: Secondary | ICD-10-CM | POA: Diagnosis not present

## 2022-12-17 DIAGNOSIS — E1165 Type 2 diabetes mellitus with hyperglycemia: Secondary | ICD-10-CM | POA: Diagnosis not present

## 2022-12-20 ENCOUNTER — Telehealth: Payer: Self-pay | Admitting: Gastroenterology

## 2022-12-20 DIAGNOSIS — I130 Hypertensive heart and chronic kidney disease with heart failure and stage 1 through stage 4 chronic kidney disease, or unspecified chronic kidney disease: Secondary | ICD-10-CM | POA: Diagnosis not present

## 2022-12-20 DIAGNOSIS — D500 Iron deficiency anemia secondary to blood loss (chronic): Secondary | ICD-10-CM | POA: Diagnosis not present

## 2022-12-20 DIAGNOSIS — E1122 Type 2 diabetes mellitus with diabetic chronic kidney disease: Secondary | ICD-10-CM | POA: Diagnosis not present

## 2022-12-20 DIAGNOSIS — Q2730 Arteriovenous malformation, site unspecified: Secondary | ICD-10-CM | POA: Diagnosis not present

## 2022-12-20 DIAGNOSIS — I5033 Acute on chronic diastolic (congestive) heart failure: Secondary | ICD-10-CM | POA: Diagnosis not present

## 2022-12-20 DIAGNOSIS — K922 Gastrointestinal hemorrhage, unspecified: Secondary | ICD-10-CM | POA: Diagnosis not present

## 2022-12-20 NOTE — Telephone Encounter (Signed)
Wife called requesting labs and imaging be ordered and sent to [facility] per their request... I explained to Mrs Befort that I have not received any correspondence from [facility]... Dr Tasia Catchings manages IDA so labs would come from her....  I called [facility] to get more info and left msg with RN provider line to check with Dr  Durene Fruits

## 2022-12-21 LAB — COMPLIANCE DRUG ANALYSIS, UR

## 2022-12-22 ENCOUNTER — Inpatient Hospital Stay: Payer: Medicare Other

## 2022-12-22 ENCOUNTER — Other Ambulatory Visit: Payer: Self-pay

## 2022-12-22 DIAGNOSIS — D631 Anemia in chronic kidney disease: Secondary | ICD-10-CM | POA: Diagnosis not present

## 2022-12-22 DIAGNOSIS — D500 Iron deficiency anemia secondary to blood loss (chronic): Secondary | ICD-10-CM

## 2022-12-22 DIAGNOSIS — D649 Anemia, unspecified: Secondary | ICD-10-CM

## 2022-12-22 DIAGNOSIS — N1831 Chronic kidney disease, stage 3a: Secondary | ICD-10-CM | POA: Diagnosis not present

## 2022-12-22 LAB — CBC WITH DIFFERENTIAL/PLATELET
Abs Immature Granulocytes: 0.08 10*3/uL — ABNORMAL HIGH (ref 0.00–0.07)
Basophils Absolute: 0.1 10*3/uL (ref 0.0–0.1)
Basophils Relative: 1 %
Eosinophils Absolute: 0.3 10*3/uL (ref 0.0–0.5)
Eosinophils Relative: 6 %
HCT: 27.3 % — ABNORMAL LOW (ref 39.0–52.0)
Hemoglobin: 8.5 g/dL — ABNORMAL LOW (ref 13.0–17.0)
Immature Granulocytes: 2 %
Lymphocytes Relative: 15 %
Lymphs Abs: 0.8 10*3/uL (ref 0.7–4.0)
MCH: 30.4 pg (ref 26.0–34.0)
MCHC: 31.1 g/dL (ref 30.0–36.0)
MCV: 97.5 fL (ref 80.0–100.0)
Monocytes Absolute: 0.5 10*3/uL (ref 0.1–1.0)
Monocytes Relative: 9 %
Neutro Abs: 3.7 10*3/uL (ref 1.7–7.7)
Neutrophils Relative %: 67 %
Platelets: 205 10*3/uL (ref 150–400)
RBC: 2.8 MIL/uL — ABNORMAL LOW (ref 4.22–5.81)
RDW: 21.7 % — ABNORMAL HIGH (ref 11.5–15.5)
WBC: 5.5 10*3/uL (ref 4.0–10.5)
nRBC: 0 % (ref 0.0–0.2)

## 2022-12-22 LAB — RETIC PANEL
Immature Retic Fract: 25.7 % — ABNORMAL HIGH (ref 2.3–15.9)
RBC.: 2.76 MIL/uL — ABNORMAL LOW (ref 4.22–5.81)
Retic Count, Absolute: 206.2 10*3/uL — ABNORMAL HIGH (ref 19.0–186.0)
Retic Ct Pct: 7.5 % — ABNORMAL HIGH (ref 0.4–3.1)
Reticulocyte Hemoglobin: 31 pg (ref >27.9)

## 2022-12-22 LAB — IRON AND TIBC
Iron: 53 ug/dL (ref 45–182)
Saturation Ratios: 16 % — ABNORMAL LOW (ref 17.9–39.5)
TIBC: 339 ug/dL (ref 250–450)
UIBC: 286 ug/dL

## 2022-12-22 LAB — FERRITIN: Ferritin: 166 ng/mL (ref 24–336)

## 2022-12-23 DIAGNOSIS — I5033 Acute on chronic diastolic (congestive) heart failure: Secondary | ICD-10-CM | POA: Diagnosis not present

## 2022-12-23 DIAGNOSIS — E1122 Type 2 diabetes mellitus with diabetic chronic kidney disease: Secondary | ICD-10-CM | POA: Diagnosis not present

## 2022-12-23 DIAGNOSIS — K922 Gastrointestinal hemorrhage, unspecified: Secondary | ICD-10-CM | POA: Diagnosis not present

## 2022-12-23 DIAGNOSIS — Q2730 Arteriovenous malformation, site unspecified: Secondary | ICD-10-CM | POA: Diagnosis not present

## 2022-12-23 DIAGNOSIS — D500 Iron deficiency anemia secondary to blood loss (chronic): Secondary | ICD-10-CM | POA: Diagnosis not present

## 2022-12-23 DIAGNOSIS — I130 Hypertensive heart and chronic kidney disease with heart failure and stage 1 through stage 4 chronic kidney disease, or unspecified chronic kidney disease: Secondary | ICD-10-CM | POA: Diagnosis not present

## 2022-12-24 DIAGNOSIS — E1122 Type 2 diabetes mellitus with diabetic chronic kidney disease: Secondary | ICD-10-CM | POA: Diagnosis not present

## 2022-12-24 DIAGNOSIS — Q2730 Arteriovenous malformation, site unspecified: Secondary | ICD-10-CM | POA: Diagnosis not present

## 2022-12-24 DIAGNOSIS — I5033 Acute on chronic diastolic (congestive) heart failure: Secondary | ICD-10-CM | POA: Diagnosis not present

## 2022-12-24 DIAGNOSIS — I130 Hypertensive heart and chronic kidney disease with heart failure and stage 1 through stage 4 chronic kidney disease, or unspecified chronic kidney disease: Secondary | ICD-10-CM | POA: Diagnosis not present

## 2022-12-24 DIAGNOSIS — K922 Gastrointestinal hemorrhage, unspecified: Secondary | ICD-10-CM | POA: Diagnosis not present

## 2022-12-24 DIAGNOSIS — D500 Iron deficiency anemia secondary to blood loss (chronic): Secondary | ICD-10-CM | POA: Diagnosis not present

## 2022-12-24 HISTORY — DX: Gastrointestinal hemorrhage, unspecified: K92.2

## 2022-12-27 DIAGNOSIS — K922 Gastrointestinal hemorrhage, unspecified: Secondary | ICD-10-CM | POA: Diagnosis not present

## 2022-12-27 DIAGNOSIS — I130 Hypertensive heart and chronic kidney disease with heart failure and stage 1 through stage 4 chronic kidney disease, or unspecified chronic kidney disease: Secondary | ICD-10-CM | POA: Diagnosis not present

## 2022-12-27 DIAGNOSIS — I5033 Acute on chronic diastolic (congestive) heart failure: Secondary | ICD-10-CM | POA: Diagnosis not present

## 2022-12-27 DIAGNOSIS — Q2730 Arteriovenous malformation, site unspecified: Secondary | ICD-10-CM | POA: Diagnosis not present

## 2022-12-27 DIAGNOSIS — D500 Iron deficiency anemia secondary to blood loss (chronic): Secondary | ICD-10-CM | POA: Diagnosis not present

## 2022-12-27 DIAGNOSIS — Z01818 Encounter for other preprocedural examination: Secondary | ICD-10-CM | POA: Diagnosis not present

## 2022-12-27 DIAGNOSIS — E1122 Type 2 diabetes mellitus with diabetic chronic kidney disease: Secondary | ICD-10-CM | POA: Diagnosis not present

## 2022-12-28 DIAGNOSIS — M6281 Muscle weakness (generalized): Secondary | ICD-10-CM | POA: Diagnosis not present

## 2022-12-28 DIAGNOSIS — Z87891 Personal history of nicotine dependence: Secondary | ICD-10-CM | POA: Diagnosis not present

## 2022-12-28 DIAGNOSIS — J9621 Acute and chronic respiratory failure with hypoxia: Secondary | ICD-10-CM | POA: Diagnosis not present

## 2022-12-28 DIAGNOSIS — I270 Primary pulmonary hypertension: Secondary | ICD-10-CM | POA: Diagnosis not present

## 2022-12-28 DIAGNOSIS — Z79891 Long term (current) use of opiate analgesic: Secondary | ICD-10-CM | POA: Diagnosis not present

## 2022-12-28 DIAGNOSIS — D500 Iron deficiency anemia secondary to blood loss (chronic): Secondary | ICD-10-CM | POA: Diagnosis not present

## 2022-12-28 DIAGNOSIS — E1122 Type 2 diabetes mellitus with diabetic chronic kidney disease: Secondary | ICD-10-CM | POA: Diagnosis not present

## 2022-12-28 DIAGNOSIS — I700 Atherosclerosis of aorta: Secondary | ICD-10-CM | POA: Diagnosis not present

## 2022-12-28 DIAGNOSIS — Q2730 Arteriovenous malformation, site unspecified: Secondary | ICD-10-CM | POA: Diagnosis not present

## 2022-12-28 DIAGNOSIS — Z794 Long term (current) use of insulin: Secondary | ICD-10-CM | POA: Diagnosis not present

## 2022-12-28 DIAGNOSIS — N1831 Chronic kidney disease, stage 3a: Secondary | ICD-10-CM | POA: Diagnosis not present

## 2022-12-28 DIAGNOSIS — F32A Depression, unspecified: Secondary | ICD-10-CM | POA: Diagnosis not present

## 2022-12-28 DIAGNOSIS — Z9981 Dependence on supplemental oxygen: Secondary | ICD-10-CM | POA: Diagnosis not present

## 2022-12-28 DIAGNOSIS — D689 Coagulation defect, unspecified: Secondary | ICD-10-CM | POA: Diagnosis not present

## 2022-12-28 DIAGNOSIS — I130 Hypertensive heart and chronic kidney disease with heart failure and stage 1 through stage 4 chronic kidney disease, or unspecified chronic kidney disease: Secondary | ICD-10-CM | POA: Diagnosis not present

## 2022-12-28 DIAGNOSIS — G35 Multiple sclerosis: Secondary | ICD-10-CM | POA: Diagnosis not present

## 2022-12-28 DIAGNOSIS — J449 Chronic obstructive pulmonary disease, unspecified: Secondary | ICD-10-CM | POA: Diagnosis not present

## 2022-12-28 DIAGNOSIS — I5033 Acute on chronic diastolic (congestive) heart failure: Secondary | ICD-10-CM | POA: Diagnosis not present

## 2022-12-28 DIAGNOSIS — R911 Solitary pulmonary nodule: Secondary | ICD-10-CM | POA: Diagnosis not present

## 2022-12-28 DIAGNOSIS — K7469 Other cirrhosis of liver: Secondary | ICD-10-CM | POA: Diagnosis not present

## 2022-12-28 DIAGNOSIS — E785 Hyperlipidemia, unspecified: Secondary | ICD-10-CM | POA: Diagnosis not present

## 2022-12-28 DIAGNOSIS — K219 Gastro-esophageal reflux disease without esophagitis: Secondary | ICD-10-CM | POA: Diagnosis not present

## 2022-12-28 DIAGNOSIS — Z7984 Long term (current) use of oral hypoglycemic drugs: Secondary | ICD-10-CM | POA: Diagnosis not present

## 2022-12-28 DIAGNOSIS — Z9181 History of falling: Secondary | ICD-10-CM | POA: Diagnosis not present

## 2022-12-28 DIAGNOSIS — K922 Gastrointestinal hemorrhage, unspecified: Secondary | ICD-10-CM | POA: Diagnosis not present

## 2022-12-29 ENCOUNTER — Inpatient Hospital Stay: Payer: Medicare Other

## 2022-12-29 ENCOUNTER — Encounter: Payer: Self-pay | Admitting: Oncology

## 2022-12-29 ENCOUNTER — Inpatient Hospital Stay: Payer: Medicare Other | Attending: Oncology | Admitting: Oncology

## 2022-12-29 VITALS — BP 132/57 | HR 76 | Resp 16

## 2022-12-29 VITALS — BP 150/51 | HR 79 | Temp 98.6°F | Resp 18 | Wt 207.8 lb

## 2022-12-29 DIAGNOSIS — Z7984 Long term (current) use of oral hypoglycemic drugs: Secondary | ICD-10-CM | POA: Diagnosis not present

## 2022-12-29 DIAGNOSIS — I129 Hypertensive chronic kidney disease with stage 1 through stage 4 chronic kidney disease, or unspecified chronic kidney disease: Secondary | ICD-10-CM | POA: Diagnosis not present

## 2022-12-29 DIAGNOSIS — K5520 Angiodysplasia of colon without hemorrhage: Secondary | ICD-10-CM | POA: Insufficient documentation

## 2022-12-29 DIAGNOSIS — Z794 Long term (current) use of insulin: Secondary | ICD-10-CM | POA: Diagnosis not present

## 2022-12-29 DIAGNOSIS — D500 Iron deficiency anemia secondary to blood loss (chronic): Secondary | ICD-10-CM

## 2022-12-29 DIAGNOSIS — D649 Anemia, unspecified: Secondary | ICD-10-CM

## 2022-12-29 DIAGNOSIS — E611 Iron deficiency: Secondary | ICD-10-CM | POA: Diagnosis not present

## 2022-12-29 DIAGNOSIS — Z79899 Other long term (current) drug therapy: Secondary | ICD-10-CM | POA: Diagnosis not present

## 2022-12-29 DIAGNOSIS — Z87891 Personal history of nicotine dependence: Secondary | ICD-10-CM | POA: Diagnosis not present

## 2022-12-29 DIAGNOSIS — E1122 Type 2 diabetes mellitus with diabetic chronic kidney disease: Secondary | ICD-10-CM | POA: Diagnosis not present

## 2022-12-29 DIAGNOSIS — Q2730 Arteriovenous malformation, site unspecified: Secondary | ICD-10-CM

## 2022-12-29 DIAGNOSIS — N189 Chronic kidney disease, unspecified: Secondary | ICD-10-CM

## 2022-12-29 DIAGNOSIS — D631 Anemia in chronic kidney disease: Secondary | ICD-10-CM

## 2022-12-29 DIAGNOSIS — N1831 Chronic kidney disease, stage 3a: Secondary | ICD-10-CM | POA: Insufficient documentation

## 2022-12-29 MED ORDER — SODIUM CHLORIDE 0.9 % IV SOLN
200.0000 mg | Freq: Once | INTRAVENOUS | Status: AC
  Administered 2022-12-29: 200 mg via INTRAVENOUS
  Filled 2022-12-29: qty 200

## 2022-12-29 MED ORDER — SODIUM CHLORIDE 0.9 % IV SOLN
INTRAVENOUS | Status: DC
  Filled 2022-12-29: qty 250

## 2022-12-29 NOTE — Patient Instructions (Signed)

## 2022-12-29 NOTE — Progress Notes (Signed)
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 Labs reviewed and discussed with patient. Iron panel has improved.  Ferritin level is better, still less than 200. Recommend IV Venofer 200 mg x 2 doses to further increase iron stores.   Anemia in chronic kidney disease (CKD) Hemoglobin decreased despite improvement of iron store. Recommend erythropoietin replacement therapy.  Rationale and potential side effects reviewed discussed with patient.  He agrees with the plan.  Will arrange patient to start Retacrit 30,000 units x 1. Repeat labs in 3 weeks +/- Retacrit if hb<10  Arteriovenous malformation (AVM) Follow-up with [facility] for further management.   Orders Placed This Encounter  Procedures   Hemoglobin and Hematocrit (Cancer Center Only)    Standing Status:   Standing    Number of Occurrences:   3    Standing Expiration Date:   12/29/2023   CBC with Differential (Cancer Center Only)    Standing Status:   Future    Standing Expiration Date:   12/29/2023   Ferritin    Standing Status:   Future    Standing Expiration Date:   12/29/2023   Iron and TIBC    Standing Status:   Future    Standing Expiration Date:   12/29/2023   Retic Panel    Standing Status:   Future    Standing Expiration Date:   12/29/2023   Follow-up her LOS. All questions were answered. The patient knows to call the clinic with any problems, questions or concerns.  Earlie Server, MD, PhD [facility] Hematology Oncology 12/29/2022     PERTINENT HEMATOLOGY HISTORY  Jeremy [name] is a 72 y.o. male who has above history reviewed by me today presents for follow up visit for anemia 09/24/2021,-09/27/2021, patient had hemoglobin dropped to 5.3 and was  advised to go to emergency room be admitted.  Patient received 2 units of PRBC transfusion IV Venofer.    Endoscopy showed gastritis.  Small capsule study showed small bowel polyp.  His hemoglobin improved to 8.2 and patient was discharged.  #12/04/2021 Small Bowel Capsule Study Showed Nonbleeding AVM, esophagitis, gastritis, poor prep.  Small bowel polyp, area of erythema in colon obscured by poor prep.  # Admitted from 10/16/22- 10/17/22. He received PRBC transfusions, Hb improved at discharged to 7.1  INTERVAL HISTORY Jeremy [name] is a 72 y.o. male who has above history reviewed by me today presents for follow up visit for management of iron deficiency anemia. Today patient reports feeling okay.  He tolerates IV Venofer treatments.  He has upcoming appointment with endoscopy at [facility] Patient denies any recent thrombosis events, stroke or MI.  Review of Systems  Constitutional:  Positive for fatigue. Negative for appetite change, chills and fever.  HENT:   Negative for hearing loss and voice change.   Eyes:  Negative for eye problems and icterus.  Respiratory:  Positive for shortness of breath. Negative for chest tightness and cough.   Cardiovascular:  Negative for chest pain and leg swelling.  Gastrointestinal:  Negative for abdominal distention and abdominal pain.  Endocrine: Negative for hot flashes.  Genitourinary:  Negative for difficulty urinating, dysuria and frequency.   Musculoskeletal:  Negative for arthralgias.  Skin:  Negative for itching and rash.  Neurological:  Negative for light-headedness and numbness.  Hematological:  Negative for adenopathy. Does not bruise/bleed easily.  Psychiatric/Behavioral:  Negative for confusion.     MEDICAL HISTORY:  Past Medical History:  Diagnosis Date   Chronic pain    Depression    Diabetes (HCC)    GERD (gastroesophageal reflux disease)    Hyperlipemia    Hypertension    MS (multiple sclerosis) ([place])     SURGICAL HISTORY: Past  Surgical History:  Procedure Laterality Date   COLECTOMY  06-2008   COLONOSCOPY  2015   normal   COLONOSCOPY WITH PROPOFOL N/A 09/10/2021   Procedure: COLONOSCOPY WITH PROPOFOL;  Surgeon: [name], MD;  Location: [facility] ENDOSCOPY;  Service: Endoscopy;  Laterality: N/A;   ESOPHAGOGASTRODUODENOSCOPY (EGD) WITH PROPOFOL N/A 09/10/2021   Procedure: ESOPHAGOGASTRODUODENOSCOPY (EGD) WITH PROPOFOL;  Surgeon: [name], MD;  Location: [facility] ENDOSCOPY;  Service: Endoscopy;  Laterality: N/A;   GIVENS CAPSULE STUDY N/A 09/25/2021   Procedure: GIVENS CAPSULE STUDY;  Surgeon: Jonathon Bellows, MD;  Location: [facility] ENDOSCOPY;  Service: Gastroenterology;  Laterality: N/A;    SOCIAL HISTORY: Social History   Socioeconomic History   Marital status: Married    Spouse name: Jeremy [name]   Number of children: 2   Years of education: GED   Highest education level: Not on file  Occupational History    Comment: Disabled  Tobacco Use   Smoking status: Former    Packs/day: 1.50    Years: 30.00    Total pack years: 45.00    Types: Cigarettes    Quit date: 01/23/2006    Years since quitting: 16.9   Smokeless tobacco: Never   Tobacco comments:    N/A  Vaping Use   Vaping Use: Never used  Substance and Sexual Activity   Alcohol use: Not Currently    Comment: rare; maybe 2 beers a year   Drug use: No   Sexual activity: Not Currently  Other Topics Concern   Not on file  Social History Narrative   Patient is disabled.    Patient lives with his wife Demariae Panagopoulos.    Patient has 2 children.       Social Determinants of Health   Financial Resource Strain: Low Risk  (11/19/2022)   Overall Financial Resource Strain (CARDIA)    Difficulty of Paying Living Expenses: Not hard at all  Food Insecurity: No Food Insecurity (12/14/2022)   Hunger Vital Sign    Worried About Running Out of Food in the Last Year: Never true    Ran Out of Food in the Last Year: Never true  Transportation Needs: No Transportation Needs  (12/14/2022)   PRAPARE - Hydrologist (Medical): No    Lack of Transportation (Non-Medical): No  Physical Activity: Insufficiently Active (11/19/2022)   Exercise Vital Sign    Days of Exercise per Week: 3 days    Minutes of Exercise per Session: 20 min  Stress: No Stress Concern Present (11/19/2022)   [place]    Feeling of Stress : Not at all  Social Connections: Moderately Isolated (11/19/2022)   Social Connection and Isolation Panel [NHANES]    Frequency of Communication with Friends and Family: More than three times a week    Frequency of Social Gatherings with Friends and Family: More than three times a week    Attends Religious Services: Never    Marine scientist or Organizations: No    Attends  Club or Organization Meetings: Never    Marital Status: Married  Human resources officer Violence: Not At Risk (11/25/2022)   Humiliation, Afraid, Rape, and Kick questionnaire    Fear of Current or Ex-Partner: No    Emotionally Abused: No    Physically Abused: No    Sexually Abused: No    FAMILY HISTORY: Family History  Problem Relation Age of Onset   Lung cancer Mother    Heart attack Father    Heart attack Brother    COPD Brother    Diabetes Brother    Esophageal cancer Nephew     ALLERGIES:  is allergic to betadine [povidone iodine].  MEDICATIONS:  Current Outpatient Medications  Medication Sig Dispense Refill   acetaminophen (TYLENOL) 500 MG tablet Take 500 mg by mouth every 6 (six) hours as needed for mild pain.     albuterol (VENTOLIN HFA) 108 (90 Base) MCG/ACT inhaler Inhale 2 puffs into the lungs in the morning, at noon, in the evening, and at bedtime.     B-D INSULIN SYRINGE 1CC/25GX1" 25G X 1" 1 ML MISC USE AS DIRECTED 10 each 2   cyanocobalamin (VITAMIN B12) 500 MCG tablet Take 1,000 mcg by mouth daily.     ferrous sulfate 325 (65 FE) MG tablet Take 325 mg by mouth daily with  breakfast.     furosemide (LASIX) 40 MG tablet Take 40 mg by mouth. 1/2 pill every other day- Singh     gemfibrozil (LOPID) 600 MG tablet Take 1 tablet (600 mg total) by mouth 2 (two) times daily before a meal. TAKE 1 TABLET BY MOUTH TWICE  DAILY BEFORE MEALS 180 tablet 0   insulin glargine (LANTUS) 100 UNIT/ML Solostar Pen Inject 20 Units into the skin at bedtime. 15 mL 11   Interferon Beta-1b (BETASERON) 0.3 MG KIT injection Inject subcutaneously 1 syringe every other day. 45 kit 4   loratadine (CLARITIN) 10 MG tablet Take 1 tablet (10 mg total) by mouth daily. 90 tablet 1   metFORMIN (GLUCOPHAGE) 500 MG tablet Take 500 mg by mouth 2 (two) times daily.     mupirocin ointment (BACTROBAN) 2 % Apply 1 Application topically 2 (two) times daily. Apply to effected area bid 30 g 1   omeprazole (PRILOSEC) 40 MG capsule TAKE 1 CAPSULE BY MOUTH DAILY 90 capsule 0   pregabalin (LYRICA) 200 MG capsule Take 1 capsule (200 mg total) by mouth 2 (two) times daily. 60 capsule 3   sertraline (ZOLOFT) 50 MG tablet TAKE ONE-HALF TABLET BY MOUTH  DAILY 45 tablet 0   simvastatin (ZOCOR) 40 MG tablet TAKE 1 TABLET BY MOUTH DAILY 90 tablet 0   sodium bicarbonate 650 MG tablet Take 650 mg by mouth 2 (two) times daily.     traMADol (ULTRAM) 50 MG tablet TAKE 1 TABLET BY MOUTH EVERY 6 HOURS AS NEEDED 120 tablet 0   No current facility-administered medications for this visit.   Facility-Administered Medications Ordered in Other Visits  Medication Dose Route Frequency Provider Last Rate Last Admin   0.9 %  sodium chloride infusion   Intravenous Continuous Earlie Server, MD   Stopped at 11/04/22 1432     PHYSICAL EXAMINATION: ECOG PERFORMANCE STATUS: 2 - Symptomatic, <50% confined to bed Vitals:   12/29/22 1425  BP: (!) 150/51  [name]: 79  Resp: 18  Temp: 98.6 F (37 C)  SpO2: 95%   Filed Weights   12/29/22 1425  Weight: 207 lb 12.8 oz (94.3 kg)    Physical  Exam Constitutional:      General: He is not in  acute distress. HENT:     Head: Normocephalic and atraumatic.  Eyes:     General: No scleral icterus. Cardiovascular:     Rate and Rhythm: Normal rate and regular rhythm.     Heart sounds: Normal heart sounds.  Pulmonary:     Effort: Pulmonary effort is normal. No respiratory distress.     Breath sounds: No wheezing.     Comments: Decreased breath sound bilaterally.   Abdominal:     General: Bowel sounds are normal. There is no distension.     Palpations: Abdomen is soft.  Musculoskeletal:        General: No deformity. Normal range of motion.     Cervical back: Normal range of motion and neck supple.  Skin:    General: Skin is warm and dry.     Findings: No erythema or rash.  Neurological:     Mental Status: He is alert and oriented to person, place, and time. Mental status is at baseline.     Cranial Nerves: No cranial nerve deficit.     Coordination: Coordination normal.  Psychiatric:        Mood and Affect: Mood normal.     LABORATORY DATA:  I have reviewed the data as listed     Latest Ref Rng & Units 12/22/2022    1:18 PM 12/14/2022    2:42 PM 12/01/2022   12:21 PM  CBC  WBC 4.0 - 10.5 K/uL 5.5  4.7  5.0   Hemoglobin 13.0 - 17.0 g/dL 8.5  9.5  10.0   Hematocrit 39.0 - 52.0 % 27.3  30.6  32.8   Platelets 150 - 400 K/uL 205  225  313       Latest Ref Rng & Units 11/26/2022    5:20 AM 11/25/2022    4:33 AM 11/24/2022    1:52 PM  CMP  Glucose 70 - 99 mg/dL 92  90  102   BUN 8 - 23 mg/dL 28  33  35   Creatinine 0.61 - 1.24 mg/dL 1.27  1.14  1.22   Sodium 135 - 145 mmol/L 140  141  141   Potassium 3.5 - 5.1 mmol/L 4.0  3.6  4.2   Chloride 98 - 111 mmol/L 103  108  108   CO2 22 - 32 mmol/L '31  24  25   '$ Calcium 8.9 - 10.3 mg/dL 8.4  8.0  8.3     Iron/TIBC/Ferritin/ %Sat    Component Value Date/Time   IRON 53 12/22/2022 1318   IRON 63 (L) 10/10/2013 1130   TIBC 339 12/22/2022 1318   TIBC 416 10/10/2013 1130   FERRITIN 166 12/22/2022 1318   FERRITIN 90 10/10/2013  1130   IRONPCTSAT 16 (L) 12/22/2022 1318   IRONPCTSAT 15 10/10/2013 1130       RADIOGRAPHIC STUDIES: I have personally reviewed the radiological images as listed and agreed with the findings in the report. No results found.

## 2022-12-29 NOTE — Assessment & Plan Note (Signed)
Follow-up with [facility] for further management.

## 2022-12-29 NOTE — Assessment & Plan Note (Signed)
Hemoglobin decreased despite improvement of iron store. Recommend erythropoietin replacement therapy.  Rationale and potential side effects reviewed discussed with patient.  He agrees with the plan.  Will arrange patient to start Retacrit 30,000 units x 1. Repeat labs in 3 weeks +/- Retacrit if hb<10

## 2022-12-29 NOTE — Assessment & Plan Note (Signed)
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 Labs reviewed and discussed with patient. Iron panel has improved.  Ferritin level is better, still less than 200. Recommend IV Venofer 200 mg x 2 doses to further increase iron stores.

## 2022-12-29 NOTE — Progress Notes (Signed)
Pt here for follow up. Pt reports that he will have a double balloon endoscopy on 3/18 at [facility]

## 2022-12-29 NOTE — Progress Notes (Signed)
Pt tolerated treatment without complaints.  VSS.  Pt refused 30 minute post observation.

## 2022-12-30 ENCOUNTER — Ambulatory Visit
Payer: Medicare Other | Attending: Student in an Organized Health Care Education/Training Program | Admitting: Student in an Organized Health Care Education/Training Program

## 2022-12-30 ENCOUNTER — Encounter: Payer: Self-pay | Admitting: Student in an Organized Health Care Education/Training Program

## 2022-12-30 ENCOUNTER — Inpatient Hospital Stay: Payer: Medicare Other

## 2022-12-30 VITALS — BP 129/84 | HR 71

## 2022-12-30 VITALS — Temp 98.1°F | Ht 70.0 in | Wt 207.0 lb

## 2022-12-30 DIAGNOSIS — G63 Polyneuropathy in diseases classified elsewhere: Secondary | ICD-10-CM | POA: Insufficient documentation

## 2022-12-30 DIAGNOSIS — E1140 Type 2 diabetes mellitus with diabetic neuropathy, unspecified: Secondary | ICD-10-CM

## 2022-12-30 DIAGNOSIS — D500 Iron deficiency anemia secondary to blood loss (chronic): Secondary | ICD-10-CM

## 2022-12-30 DIAGNOSIS — D631 Anemia in chronic kidney disease: Secondary | ICD-10-CM

## 2022-12-30 DIAGNOSIS — G35 Multiple sclerosis: Secondary | ICD-10-CM | POA: Diagnosis not present

## 2022-12-30 DIAGNOSIS — G894 Chronic pain syndrome: Secondary | ICD-10-CM | POA: Diagnosis not present

## 2022-12-30 DIAGNOSIS — G609 Hereditary and idiopathic neuropathy, unspecified: Secondary | ICD-10-CM | POA: Insufficient documentation

## 2022-12-30 DIAGNOSIS — N1831 Chronic kidney disease, stage 3a: Secondary | ICD-10-CM | POA: Diagnosis not present

## 2022-12-30 DIAGNOSIS — E1122 Type 2 diabetes mellitus with diabetic chronic kidney disease: Secondary | ICD-10-CM | POA: Diagnosis not present

## 2022-12-30 DIAGNOSIS — K5520 Angiodysplasia of colon without hemorrhage: Secondary | ICD-10-CM | POA: Diagnosis not present

## 2022-12-30 DIAGNOSIS — K219 Gastro-esophageal reflux disease without esophagitis: Secondary | ICD-10-CM

## 2022-12-30 DIAGNOSIS — Z794 Long term (current) use of insulin: Secondary | ICD-10-CM | POA: Diagnosis not present

## 2022-12-30 DIAGNOSIS — E349 Endocrine disorder, unspecified: Secondary | ICD-10-CM

## 2022-12-30 DIAGNOSIS — E1142 Type 2 diabetes mellitus with diabetic polyneuropathy: Secondary | ICD-10-CM | POA: Diagnosis not present

## 2022-12-30 DIAGNOSIS — Z0289 Encounter for other administrative examinations: Secondary | ICD-10-CM | POA: Insufficient documentation

## 2022-12-30 DIAGNOSIS — Z87891 Personal history of nicotine dependence: Secondary | ICD-10-CM | POA: Diagnosis not present

## 2022-12-30 DIAGNOSIS — E611 Iron deficiency: Secondary | ICD-10-CM | POA: Diagnosis not present

## 2022-12-30 MED ORDER — TRAMADOL HCL 50 MG PO TABS: 50.0000 mg | ORAL_TABLET | Freq: Four times a day (QID) | ORAL | 2 refills | Status: DC | PRN

## 2022-12-30 MED ORDER — PREGABALIN 200 MG PO CAPS: 200.0000 mg | ORAL_CAPSULE | Freq: Two times a day (BID) | ORAL | 5 refills | Status: DC

## 2022-12-30 MED ORDER — EPOETIN ALFA-EPBX 10000 UNIT/ML IJ SOLN
30000.0000 [IU] | Freq: Once | INTRAMUSCULAR | Status: AC
  Administered 2022-12-30: 30000 [IU] via SUBCUTANEOUS
  Filled 2022-12-30: qty 3

## 2022-12-30 NOTE — Progress Notes (Signed)
Safety precautions to be maintained throughout the outpatient stay will include: orient to surroundings, keep bed in low position, maintain call bell within reach at all times, provide assistance with transfer out of bed and ambulation.  Nursing Pain Medication Assessment:  Safety precautions to be maintained throughout the outpatient stay will include: orient to surroundings, keep bed in low position, maintain call bell within reach at all times, provide assistance with transfer out of bed and ambulation.  Medication Inspection Compliance: Pill count conducted under aseptic conditions, in front of the patient. Neither the pills nor the bottle was removed from the patient's sight at any time. Once count was completed pills were immediately returned to the patient in their original bottle.  Medication #1: Tramadol (Ultram) Pill/Patch Count:  31 of 120 pills remain Pill/Patch Appearance: Markings consistent with prescribed medication Bottle Appearance: Standard pharmacy container. Clearly labeled. Filled Date: 2 / 14 / 2024 Last Medication intake:  Today

## 2022-12-30 NOTE — Progress Notes (Signed)
PROVIDER NOTE: Information contained herein reflects review and annotations entered in association with encounter. Interpretation of such information and data should be left to medically-trained personnel. Information provided to patient can be located elsewhere in the medical record under "Patient Instructions". Document created using STT-dictation technology, any transcriptional errors that may result from process are unintentional.    Patient: Jeremy [name]  Service Category: E/M  Provider: Gillis Santa, MD  DOB: August 16, 1951  DOS: 12/30/2022  Referring Provider: Juline Patch, MD  MRN: [id number]  Specialty: Interventional Pain Management  PCP: Juline Patch, MD  Type: Established Patient  Setting: Ambulatory outpatient    Location: Office  Delivery: Face-to-face     HPI  [name], a 72 y.o. year old male, is here today because of his Chronic pain syndrome [G89.4]. [name] primary complain today is Foot Pain (bialteral)  Pain Assessment: Severity of Neuropathic pain is reported as a 2 /10. Location: Foot Right, Left/denies. Onset: More than a month ago. Quality: Constant, Discomfort. Timing:  . Modifying factor(s): meds. Vitals:  height is '5\' 10"'$  (1.778 m) and weight is 207 lb (93.9 kg). His temporal temperature is 98.1 F (36.7 C).  BMI: Estimated body mass index is 29.7 kg/m as calculated from the following:   Height as of this encounter: '5\' 10"'$  (1.778 m).   Weight as of this encounter: 207 lb (93.9 kg). Last encounter: 12/16/2022. Last procedure: Visit date not found.  Reason for encounter:   No change in medical history since last visit.  Patient's pain is at baseline.  Patient continues multimodal pain regimen as prescribed.  States that it provides pain relief and improvement in functional status. 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 is a pleasant 72 year old male who presents  with generalized weakness, deconditioning and chronic pain. He has a history of multiple sclerosis. He also has a history of insulin-dependent diabetes. He has gait difficulties. He also has diabetic peripheral neuropathy. He ambulates with a rolling walker. He has a wheelchair when he goes out and is present in a wheelchair today. He is on Betaseron for MS management. He is on continuous nasal oxygen. He gets iron infusions. History of GI bleeds as well. He is being referred from his neurologist for management of his tramadol and Lyrica. He was a former smoker but no longer smokes.   Pharmacotherapy Assessment  Analgesic: Tramadol 50 mg q6 hrs prn  Monitoring: [place] PMP: PDMP reviewed during this encounter.       Pharmacotherapy: No side-effects or adverse reactions reported. Compliance: No problems identified. Effectiveness: Clinically acceptable.  Arlice Colt, RN  12/30/2022  9:48 AM  Sign when Signing Visit Safety precautions to be maintained throughout the outpatient stay will include: orient to surroundings, keep bed in low position, maintain call bell within reach at all times, provide assistance with transfer out of bed and ambulation.  Nursing Pain Medication Assessment:  Safety precautions to be maintained throughout the outpatient stay will include: orient to surroundings, keep bed in low position, maintain call bell within reach at all times, provide assistance with transfer out of bed and ambulation.  Medication Inspection Compliance: Pill count conducted under aseptic conditions, in front of the patient. Neither the pills nor the bottle was removed from the patient's sight at any time. Once count was completed pills were immediately returned to the patient in their original bottle.  Medication #  1: Tramadol (Ultram) Pill/Patch Count:  31 of 120 pills remain Pill/Patch Appearance: Markings consistent with prescribed medication Bottle Appearance: Standard pharmacy container. Clearly  labeled. Filled Date: 2 / 14 / 2024 Last Medication intake:  Today       No results found for: "CBDTHCR" No results found for: "D8THCCBX" No results found for: "D9THCCBX"  UDS:  Summary  Date Value Ref Range Status  12/16/2022 Note  Final    Comment:    ==================================================================== Compliance Drug Analysis, Ur ==================================================================== Test                             Result       Flag       Units  Drug Present and Declared for Prescription Verification   Tramadol                       >5155        EXPECTED   ng/mg creat   O-Desmethyltramadol            >5155        EXPECTED   ng/mg creat   N-Desmethyltramadol            1223         EXPECTED   ng/mg creat    Source of tramadol is a prescription medication. O-desmethyltramadol    and N-desmethyltramadol are expected metabolites of tramadol.    Pregabalin                     PRESENT      EXPECTED   Sertraline                     PRESENT      EXPECTED   Desmethylsertraline            PRESENT      EXPECTED    Desmethylsertraline is an expected metabolite of sertraline.    Acetaminophen                  PRESENT      EXPECTED ==================================================================== Test                      Result    Flag   Units      Ref Range   Creatinine              97               mg/dL      >=20 ==================================================================== Declared Medications:  The flagging and interpretation on this report are based on the  following declared medications.  Unexpected results may arise from  inaccuracies in the declared medications.   **Note: The testing scope of this panel includes these medications:   Pregabalin (Lyrica)  Sertraline (Zoloft)  Tramadol (Ultram)   **Note: The testing scope of this panel does not include small to  moderate amounts of these reported medications:   Acetaminophen    **Note: The testing scope of this panel does not include the  following reported medications:   Albuterol  Furosemide (Lasix)  Gemfibrozil (Lopid)  Insulin (Lantus)  Interferon beta-1b (Betaseron)  Iron  Loratadine (Claritin)  Metformin (Glucophage)  Mupirocin (Bactroban)  Omeprazole (Prilosec)  Sodium Bicarbonate  Vitamin B12 ==================================================================== For clinical consultation, please call [phone]. ====================================================================       ROS  Constitutional: Denies any  fever or chills Gastrointestinal: No reported hemesis, hematochezia, vomiting, or acute GI distress Musculoskeletal:  low back pain Neurological:  b/l feet parasthesias  Medication Review  Insulin Syringe-Needle U-100, Interferon Beta-1b, acetaminophen, albuterol, cyanocobalamin, ferrous sulfate, furosemide, gemfibrozil, insulin glargine, loratadine, metFORMIN, mupirocin ointment, omeprazole, pregabalin, sertraline, simvastatin, sodium bicarbonate, and traMADol  History Review  Allergy: [name] is allergic to betadine [povidone iodine]. Drug: [name]  reports no history of drug use. Alcohol:  reports that he does not currently use alcohol. Tobacco:  reports that he quit smoking about 16 years ago. His smoking use included cigarettes. He has a 45.00 pack-year smoking history. He has never used smokeless tobacco. Social: [name]  reports that he quit smoking about 16 years ago. His smoking use included cigarettes. He has a 45.00 pack-year smoking history. He has never used smokeless tobacco. He reports that he does not currently use alcohol. He reports that he does not use drugs. Medical:  has a past medical history of Chronic pain, Depression, Diabetes ([place]), GERD (gastroesophageal reflux disease), Hyperlipemia, Hypertension, and MS (multiple sclerosis) ([place]). Surgical: [name]  has a past surgical history that includes  Colectomy (06-2008); Colonoscopy (2015); Colonoscopy with propofol (N/A, 09/10/2021); Esophagogastroduodenoscopy (egd) with propofol (N/A, 09/10/2021); and Givens capsule study (N/A, 09/25/2021). Family: family history includes COPD in his brother; Diabetes in his brother; Esophageal cancer in his nephew; Heart attack in his brother and father; Lung cancer in his mother.  Laboratory Chemistry Profile   Renal Lab Results  Component Value Date   BUN 28 (H) 11/26/2022   CREATININE 1.27 (H) 11/26/2022   BCR 23 02/15/2022   GFRAA 61 01/15/2020   GFRNONAA >60 11/26/2022    Hepatic Lab Results  Component Value Date   AST 20 08/18/2022   ALT 10 08/18/2022   ALBUMIN 2.7 (L) 08/23/2022   ALKPHOS 61 08/18/2022   AMMONIA 47 (H) 08/18/2022    Electrolytes Lab Results  Component Value Date   NA 140 11/26/2022   K 4.0 11/26/2022   CL 103 11/26/2022   CALCIUM 8.4 (L) 11/26/2022   PHOS 2.8 08/23/2022    Bone No results found for: "VD25OH", "VD125OH2TOT", "[id number]", "UK:060616", "25OHVITD1", "25OHVITD2", "[id number]", "TESTOFREE", "TESTOSTERONE"  Inflammation (CRP: Acute Phase) (ESR: Chronic Phase) Lab Results  Component Value Date   LATICACIDVEN 1.1 08/18/2022         Note: Above Lab results reviewed.  Recent Imaging Review  DG Chest 1 View CLINICAL DATA:  Congestive heart failure.  EXAM: CHEST  1 VIEW  COMPARISON:  November 16, 2022.  FINDINGS: Stable cardiomediastinal silhouette. Mild central pulmonary vascular congestion is noted. Right upper lobe nodular density is again noted. New left midlung opacity is noted most consistent with pneumonia given how quickly it is developed. Bony thorax is unremarkable.  IMPRESSION: Mild central pulmonary vascular congestion. New left midlung opacity is noted most consistent with pneumonia given how quickly it has developed.  Electronically Signed   By: Marijo Conception M.D.   On: 11/24/2022 16:30 Note: Reviewed        Physical  Exam  General appearance: Well nourished, well developed, and well hydrated. In no apparent acute distress Mental status: Alert, oriented x 3 (person, place, & time)       Respiratory: No evidence of acute respiratory distress Eyes: PERLA Vitals: Temp 98.1 F (36.7 C) (Temporal)   Ht '5\' 10"'$  (1.778 m)   Wt 207 lb (93.9 kg)   BMI 29.70 kg/m  BMI: Estimated body mass  index is 29.7 kg/m as calculated from the following:   Height as of this encounter: '5\' 10"'$  (1.778 m).   Weight as of this encounter: 207 lb (93.9 kg). Ideal: Ideal body weight: 73 kg (160 lb 15 oz) Adjusted ideal body weight: 81.4 kg (179 lb 5.8 oz)  Patient in wheelchair, fairly deconditioned Limited lumbar spinal flexion and extension, tenderness to palpation along lower lumbar spine Mild edema of bilateral lower extremity Neuropathy of bilateral legs   4 out of 5 strength bilateral lower extremity: Plantar flexion, dorsiflexion, knee flexion, knee extension.  Assessment   Diagnosis Status  1. Chronic pain syndrome   2. Stage 3a chronic kidney disease (Jeremy [name])   3. 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 Type 2 diabetes mellitus with diabetic polyneuropathy, with long-term current use of insulin ([place])   5. Multiple sclerosis ([place])   6. Gastroesophageal reflux disease without esophagitis   7. Pain management contract signed   8. 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  Plan of Care  Problem-specific:  No problem-specific Assessment & Plan notes found for this encounter.  [name] has a current medication list which includes the following long-term medication(s): gemfibrozil, insulin glargine, betaseron, loratadine, omeprazole, sertraline, simvastatin, and pregabalin.  Pharmacotherapy (Medications Ordered): Meds ordered this encounter  Medications   traMADol (ULTRAM)  50 MG tablet    Sig: Take 1 tablet (50 mg total) by mouth every 6 (six) hours as needed.    Dispense:  120 tablet    Refill:  2   pregabalin (LYRICA) 200 MG capsule    Sig: Take 1 capsule (200 mg total) by mouth 2 (two) times daily.    Dispense:  60 capsule    Refill:  5   Orders:  No orders of the defined types were placed in this encounter.   Analgesic Pharmacotherapy:  Opioid Analgesics: Tramadol 50 mg every 6 hours as needed, quantity 120/month.  Patient has been seeing neurology in [facility] for the last 20 years however new nurse practitioner does not want to continue managing his tramadol, no previous compliance issues, patient is low risk.  Membrane stabilizer:   Lyrica 200 mg twice a day that PCP no longer wants to manage  Muscle relaxant: Not indicated  NSAID: Medically contraindicated history of GI bleed  Other analgesic(s): To be determined at a later time    Interventional management options: [name] was informed that there is no guarantee that he would be a candidate for interventional therapies. The decision will be based on the results of diagnostic studies, as well as [name]'s risk profile.  Procedure(s) under consideration:  Discussed Qutenza Lumbar facet medial branch nerve blocks     Follow-up plan:   Return in about 3 months (around 04/01/2023) for Medication Management, in person.      Recent Visits Date Type Provider Dept  12/16/22 Office Visit Gillis Santa, MD [facility]  Showing recent visits within past 90 days and meeting all other requirements Today's Visits Date Type Provider Dept  12/30/22 Office Visit Gillis Santa, MD [facility]  Showing today's visits and meeting all other requirements Future Appointments Date Type Provider Dept  03/29/23 Appointment Gillis Santa, MD [facility]  Showing future appointments within next 90 days and meeting all other requirements  I discussed the assessment and treatment  plan with the patient. The patient  was provided an opportunity to ask questions and all were answered. The patient agreed with the plan and demonstrated an understanding of the instructions.  Patient advised to call back or seek an in-person evaluation if the symptoms or condition worsens.  Duration of encounter: 30 minutes.  Total time on encounter, as per AMA guidelines included both the face-to-face and non-face-to-face time personally spent by the physician and/or other qualified health care professional(s) on the day of the encounter (includes time in activities that require the physician or other qualified health care professional and does not include time in activities normally performed by clinical staff). Physician's time may include the following activities when performed: Preparing to see the patient (e.g., pre-charting review of records, searching for previously ordered imaging, lab work, and nerve conduction tests) Review of prior analgesic pharmacotherapies. Reviewing PMP Interpreting ordered tests (e.g., lab work, imaging, nerve conduction tests) Performing post-procedure evaluations, including interpretation of diagnostic procedures Obtaining and/or reviewing separately obtained history Performing a medically appropriate examination and/or evaluation Counseling and educating the patient/family/caregiver Ordering medications, tests, or procedures Referring and communicating with other health care professionals (when not separately reported) Documenting clinical information in the electronic or other health record Independently interpreting results (not separately reported) and communicating results to the patient/ family/caregiver Care coordination (not separately reported)  Note by: Gillis Santa, MD Date: 12/30/2022; Time: 10:10 AM

## 2022-12-31 ENCOUNTER — Other Ambulatory Visit: Payer: Self-pay

## 2022-12-31 ENCOUNTER — Ambulatory Visit (INDEPENDENT_AMBULATORY_CARE_PROVIDER_SITE_OTHER): Payer: Medicare Other | Admitting: Family Medicine

## 2022-12-31 VITALS — BP 138/78 | HR 82 | Ht 70.0 in | Wt 208.0 lb

## 2022-12-31 DIAGNOSIS — E782 Mixed hyperlipidemia: Secondary | ICD-10-CM

## 2022-12-31 DIAGNOSIS — K219 Gastro-esophageal reflux disease without esophagitis: Secondary | ICD-10-CM

## 2022-12-31 DIAGNOSIS — F331 Major depressive disorder, recurrent, moderate: Secondary | ICD-10-CM

## 2022-12-31 DIAGNOSIS — J301 Allergic rhinitis due to pollen: Secondary | ICD-10-CM | POA: Diagnosis not present

## 2022-12-31 MED ORDER — OMEPRAZOLE 40 MG PO CPDR: 40.0000 mg | DELAYED_RELEASE_CAPSULE | Freq: Every day | ORAL | 1 refills | Status: DC

## 2022-12-31 MED ORDER — LORATADINE 10 MG PO TABS: 10.0000 mg | ORAL_TABLET | Freq: Every day | ORAL | 1 refills | Status: DC

## 2022-12-31 MED ORDER — SIMVASTATIN 40 MG PO TABS: 40.0000 mg | ORAL_TABLET | Freq: Every day | ORAL | 1 refills | Status: DC

## 2022-12-31 MED ORDER — SERTRALINE HCL 50 MG PO TABS: ORAL_TABLET | ORAL | 1 refills | Status: DC

## 2022-12-31 MED ORDER — GEMFIBROZIL 600 MG PO TABS: 600.0000 mg | ORAL_TABLET | Freq: Two times a day (BID) | ORAL | 1 refills | Status: DC

## 2022-12-31 NOTE — Progress Notes (Signed)
Date:  12/31/2022   Name:  Jeremy [name]   DOB:  1951-02-29   MRN:  [id number]   Chief Complaint: Hyperlipidemia, Allergic Rhinitis , Gastroesophageal Reflux, and Depression  Hyperlipidemia This is a chronic problem. The current episode started more than 1 year ago. The problem is controlled. Recent lipid tests were reviewed and are normal. Exacerbating diseases include chronic renal disease and diabetes. Pertinent negatives include no chest pain, myalgias or shortness of breath. Current antihyperlipidemic treatment includes statins and fibric acid derivatives. The current treatment provides moderate improvement of lipids. There are no compliance problems.   Gastroesophageal Reflux He reports no abdominal pain, no chest pain, no choking, no coughing, no dysphagia, no heartburn, no nausea, no sore throat or no wheezing. This is a chronic problem. The problem has been gradually improving. The symptoms are aggravated by certain foods. Pertinent negatives include no fatigue or melena. He has tried a PPI for the symptoms. The treatment provided moderate relief.  Depression        This is a chronic problem.  The current episode started more than 1 year ago.   The onset quality is gradual.   The problem has been gradually improving since onset.  Associated symptoms include no decreased concentration, no fatigue, no helplessness, no hopelessness, does not have insomnia, not irritable, no restlessness, no decreased interest, no appetite change, no body aches, no myalgias, no headaches, no indigestion, not sad and no suicidal ideas.     The symptoms are aggravated by nothing.  Past treatments include SSRIs - Selective serotonin reuptake inhibitors.  Compliance with treatment is good.  Previous treatment provided moderate relief.   Lab Results  Component Value Date   NA 140 11/26/2022   K 4.0 11/26/2022   CO2 31 11/26/2022   GLUCOSE 92 11/26/2022   BUN 28 (H) 11/26/2022   CREATININE 1.27 (H) 11/26/2022    CALCIUM 8.4 (L) 11/26/2022   EGFR 53 (L) 02/15/2022   GFRNONAA >60 11/26/2022   Lab Results  Component Value Date   CHOL 103 07/10/2021   HDL 22 (L) 07/10/2021   LDLCALC 35 07/10/2021   TRIG 228 (H) 07/10/2021   CHOLHDL 4.7 07/10/2021   Lab Results  Component Value Date   TSH 1.90 03/30/2021   Lab Results  Component Value Date   HGBA1C 4.4 (L) 11/24/2022   Lab Results  Component Value Date   WBC 5.5 12/22/2022   HGB 8.5 (L) 12/22/2022   HCT 27.3 (L) 12/22/2022   MCV 97.5 12/22/2022   PLT 205 12/22/2022   Lab Results  Component Value Date   ALT 10 08/18/2022   AST 20 08/18/2022   ALKPHOS 61 08/18/2022   BILITOT 0.5 08/18/2022   No results found for: "25OHVITD2", "25OHVITD3", "VD25OH"   Review of Systems  Constitutional:  Negative for appetite change, chills, fatigue and fever.  HENT:  Negative for drooling, ear discharge, ear pain and sore throat.   Respiratory:  Negative for cough, choking, shortness of breath and wheezing.   Cardiovascular:  Negative for chest pain, palpitations and leg swelling.  Gastrointestinal:  Negative for abdominal pain, blood in stool, constipation, diarrhea, dysphagia, heartburn, melena and nausea.  Endocrine: Negative for polydipsia.  Genitourinary:  Negative for dysuria, frequency, hematuria and urgency.  Musculoskeletal:  Negative for back pain, myalgias and neck pain.  Skin:  Negative for rash.  Allergic/Immunologic: Negative for environmental allergies.  Neurological:  Negative for dizziness and headaches.  Hematological:  Does not bruise/bleed easily.  Psychiatric/Behavioral:  Positive for depression. Negative for decreased concentration and suicidal ideas. The patient is not nervous/anxious and does not have insomnia.     Patient Active Problem List   Diagnosis Date Noted   GI bleed 11/24/2022   Arteriovenous malformation (AVM) 10/21/2022   Weakness 08/18/2022   AKI (acute kidney injury) ([place]) 08/18/2022   Severe sepsis  ([place]) 02/04/2022   Pneumonia 02/03/2022   Acute on chronic respiratory failure with hypoxia ([place]) 02/03/2022   Hyperkalemia 02/03/2022   COPD (chronic obstructive pulmonary disease) ([place]) 02/03/2022   SIRS (systemic inflammatory response syndrome), possible sepsis ([place]) 02/03/2022   Anemia due to blood loss 09/24/2021   Gastric polyp    Gastritis without bleeding    Anemia in chronic kidney disease (CKD) 06/19/2021   Iron deficiency anemia due to chronic blood loss 06/19/2021   Gait abnormality 05/07/2021   Stage 3a chronic kidney disease ([place]) 05/20/2020   DM type 2 with diabetic peripheral neuropathy ([place]) 05/06/2020   Idiopathic peripheral neuropathy 01/15/2020   Generalized edema 01/15/2020   Primary pulmonary hypertension ([place]) 10/28/2019   Therapeutic drug monitoring 03/23/2018   Mild episode of recurrent major depressive disorder ([place]) 09/26/2017   Ventricular ectopic beats 09/26/2017   Type 2 diabetes mellitus with diabetic polyneuropathy, with long-term current use of insulin ([place]) 08/12/2017   Hyperlipidemia due to type 2 diabetes mellitus ([place]) 08/12/2017   B12 deficiency 12/14/2016   Low back pain 03/10/2016   Lumbosacral disc disease 01/01/2016   Neuropathy associated with endocrine disorder ([place]) 11/19/2015   Iron deficiency anemia 06/03/2015   Essential hypertension 06/03/2015   Hyperlipidemia 06/03/2015   Depression 06/03/2015   Gastroesophageal reflux disease without esophagitis 06/03/2015   Edema extremities 06/03/2015   Multiple sclerosis ([place]) 07/26/2013   Abnormality of gait 07/26/2013   Morbid obesity ([place]) 07/26/2013    Allergies  Allergen Reactions   Betadine [Povidone Iodine]     Past Surgical History:  Procedure Laterality Date   COLECTOMY  06-2008   COLONOSCOPY  2015   normal   COLONOSCOPY WITH PROPOFOL N/A 09/10/2021   Procedure: COLONOSCOPY WITH PROPOFOL;  Surgeon: [name], MD;  Location: [facility] ENDOSCOPY;  Service: Endoscopy;   Laterality: N/A;   ESOPHAGOGASTRODUODENOSCOPY (EGD) WITH PROPOFOL N/A 09/10/2021   Procedure: ESOPHAGOGASTRODUODENOSCOPY (EGD) WITH PROPOFOL;  Surgeon: [name], MD;  Location: [facility] ENDOSCOPY;  Service: Endoscopy;  Laterality: N/A;   GIVENS CAPSULE STUDY N/A 09/25/2021   Procedure: GIVENS CAPSULE STUDY;  Surgeon: Jonathon Bellows, MD;  Location: [facility] ENDOSCOPY;  Service: Gastroenterology;  Laterality: N/A;    Social History   Tobacco Use   Smoking status: Former    Packs/day: 1.50    Years: 30.00    Total pack years: 45.00    Types: Cigarettes    Quit date: 01/23/2006    Years since quitting: 16.9   Smokeless tobacco: Never   Tobacco comments:    N/A  Vaping Use   Vaping Use: Never used  Substance Use Topics   Alcohol use: Not Currently    Comment: rare; maybe 2 beers a year   Drug use: No     Medication list has been reviewed and updated.  Current Meds  Medication Sig   acetaminophen (TYLENOL) 500 MG tablet Take 500 mg by mouth every 6 (six) hours as needed for mild pain.   albuterol (VENTOLIN HFA) 108 (90 Base) MCG/ACT inhaler Inhale 2 puffs into the lungs in the morning, at noon, in the evening, and at bedtime.   B-D  INSULIN SYRINGE 1CC/25GX1" 25G X 1" 1 ML MISC USE AS DIRECTED   cyanocobalamin (VITAMIN B12) 500 MCG tablet Take 1,000 mcg by mouth daily.   ferrous sulfate 325 (65 FE) MG tablet Take 325 mg by mouth daily with breakfast.   furosemide (LASIX) 40 MG tablet Take 40 mg by mouth. 1/2 pill every other day- Singh   gemfibrozil (LOPID) 600 MG tablet Take 1 tablet (600 mg total) by mouth 2 (two) times daily before a meal. TAKE 1 TABLET BY MOUTH TWICE  DAILY BEFORE MEALS   insulin glargine (LANTUS) 100 UNIT/ML Solostar Pen Inject 20 Units into the skin at bedtime.   Interferon Beta-1b (BETASERON) 0.3 MG KIT injection Inject subcutaneously 1 syringe every other day.   loratadine (CLARITIN) 10 MG tablet Take 1 tablet (10 mg total) by mouth daily.   metFORMIN (GLUCOPHAGE)  500 MG tablet Take 500 mg by mouth 2 (two) times daily.   mupirocin ointment (BACTROBAN) 2 % Apply 1 Application topically 2 (two) times daily. Apply to effected area bid   omeprazole (PRILOSEC) 40 MG capsule TAKE 1 CAPSULE BY MOUTH DAILY   pregabalin (LYRICA) 200 MG capsule Take 1 capsule (200 mg total) by mouth 2 (two) times daily.   sertraline (ZOLOFT) 50 MG tablet TAKE ONE-HALF TABLET BY MOUTH  DAILY   simvastatin (ZOCOR) 40 MG tablet TAKE 1 TABLET BY MOUTH DAILY   sodium bicarbonate 650 MG tablet Take 650 mg by mouth 2 (two) times daily.   [START ON 01/07/2023] traMADol (ULTRAM) 50 MG tablet Take 1 tablet (50 mg total) by mouth every 6 (six) hours as needed.       12/31/2022    1:16 PM 12/10/2022    2:19 PM 11/16/2022    3:56 PM 07/02/2022    1:33 PM  GAD 7 : Generalized Anxiety Score  Nervous, Anxious, on Edge 0 0 0 0  Control/stop worrying 0 0 0 0  Worry too much - different things 0 0 0 0  Trouble relaxing 0 0 0 0  Restless 0 0 0 0  Easily annoyed or irritable 0 0 0 0  Afraid - awful might happen 0 0 0 0  Total GAD 7 Score 0 0 0 0  Anxiety Difficulty Not difficult at all Not difficult at all Not difficult at all Not difficult at all       12/31/2022    1:16 PM 12/10/2022    2:19 PM 11/16/2022    3:56 PM  Depression screen PHQ 2/9  Decreased Interest 0 0 0  Down, Depressed, Hopeless 0 0 0  PHQ - 2 Score 0 0 0  Altered sleeping 0 0 0  Tired, decreased energy 0 0 0  Change in appetite 0 0 0  Feeling bad or failure about yourself  0 0 0  Trouble concentrating 0 0 0  Moving slowly or fidgety/restless 0 0 0  Suicidal thoughts 0 0 0  PHQ-9 Score 0 0 0  Difficult doing work/chores Not difficult at all Not difficult at all Not difficult at all    BP Readings from Last 3 Encounters:  12/31/22 138/78  12/30/22 129/84  12/29/22 (!) 132/57    Physical Exam Vitals and nursing note reviewed.  Constitutional:      General: He is not irritable. HENT:     Head: Normocephalic.      Right Ear: Tympanic membrane and external ear normal.     Left Ear: Tympanic membrane and external ear normal.  Nose: Nose normal.     Mouth/Throat:     Mouth: Mucous membranes are moist.  Eyes:     General: No scleral icterus.       Right eye: No discharge.        Left eye: No discharge.     Conjunctiva/sclera: Conjunctivae normal.     Pupils: Pupils are equal, round, and reactive to light.  Neck:     Thyroid: No thyromegaly.     Vascular: No JVD.     Trachea: No tracheal deviation.  Cardiovascular:     Rate and Rhythm: Normal rate and regular rhythm.     Heart sounds: Normal heart sounds. No murmur heard.    No friction rub. No gallop.  Pulmonary:     Effort: No respiratory distress.     Breath sounds: Normal breath sounds. No wheezing, rhonchi or rales.  Abdominal:     General: Bowel sounds are normal.     Palpations: Abdomen is soft. There is no mass.     Tenderness: There is no abdominal tenderness. There is no guarding or rebound.  Musculoskeletal:        General: No tenderness. Normal range of motion.     Cervical back: Normal range of motion and neck supple.  Lymphadenopathy:     Cervical: No cervical adenopathy.  Skin:    General: Skin is warm.     Findings: No rash.  Neurological:     Mental Status: He is alert and oriented to person, place, and time.     Cranial Nerves: No cranial nerve deficit.     Deep Tendon Reflexes: Reflexes are normal and symmetric.     Wt Readings from Last 3 Encounters:  12/31/22 208 lb (94.3 kg)  12/30/22 207 lb (93.9 kg)  12/29/22 207 lb 12.8 oz (94.3 kg)    BP 138/78   Pulse 82   Ht '5\' 10"'$  (1.778 m)   Wt 208 lb (94.3 kg)   SpO2 92%   BMI 29.84 kg/m   Assessment and Plan:  1. Mixed hyperlipidemia Chronic.  Controlled.  Stable.  Continue gemfibrozil 600 mg twice a day and simvastatin 40 mg once a day.  Will check lipid panel on next visit. - gemfibrozil (LOPID) 600 MG tablet; Take 1 tablet (600 mg total) by mouth 2  (two) times daily before a meal. TAKE 1 TABLET BY MOUTH TWICE  DAILY BEFORE MEALS  Dispense: 180 tablet; Refill: 1 - simvastatin (ZOCOR) 40 MG tablet; Take 1 tablet (40 mg total) by mouth daily.  Dispense: 90 tablet; Refill: 1  2. Seasonal allergic rhinitis due to pollen Chronic.  Controlled.  Stable.  Seasonal controlled with loratadine 10 mg once a day. - loratadine (CLARITIN) 10 MG tablet; Take 1 tablet (10 mg total) by mouth daily.  Dispense: 90 tablet; Refill: 1  3. Gastroesophageal reflux disease without esophagitis Chronic.  Controlled.  Stable.  Patient has control of heartburn and reflux with omeprazole 40 mg once a day. - omeprazole (PRILOSEC) 40 MG capsule; Take 1 capsule (40 mg total) by mouth daily.  Dispense: 90 capsule; Refill: 1  4. 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  Controlled.  Stable.  PHQ is 0.  GAD score is 0.  Continue current dosing of sertraline 50 mg 1/2 tablet daily.  Will recheck in 6 months. - sertraline (ZOLOFT) 50 MG tablet; TAKE ONE-HALF TABLET BY MOUTH  DAILY  Dispense: 45 tablet; Refill: 1    Otilio Miu, MD

## 2023-01-03 DIAGNOSIS — Q2730 Arteriovenous malformation, site unspecified: Secondary | ICD-10-CM | POA: Diagnosis not present

## 2023-01-03 DIAGNOSIS — E1122 Type 2 diabetes mellitus with diabetic chronic kidney disease: Secondary | ICD-10-CM | POA: Diagnosis not present

## 2023-01-03 DIAGNOSIS — K922 Gastrointestinal hemorrhage, unspecified: Secondary | ICD-10-CM | POA: Diagnosis not present

## 2023-01-03 DIAGNOSIS — I130 Hypertensive heart and chronic kidney disease with heart failure and stage 1 through stage 4 chronic kidney disease, or unspecified chronic kidney disease: Secondary | ICD-10-CM | POA: Diagnosis not present

## 2023-01-03 DIAGNOSIS — I5033 Acute on chronic diastolic (congestive) heart failure: Secondary | ICD-10-CM | POA: Diagnosis not present

## 2023-01-03 DIAGNOSIS — D500 Iron deficiency anemia secondary to blood loss (chronic): Secondary | ICD-10-CM | POA: Diagnosis not present

## 2023-01-04 DIAGNOSIS — D500 Iron deficiency anemia secondary to blood loss (chronic): Secondary | ICD-10-CM | POA: Diagnosis not present

## 2023-01-04 DIAGNOSIS — I130 Hypertensive heart and chronic kidney disease with heart failure and stage 1 through stage 4 chronic kidney disease, or unspecified chronic kidney disease: Secondary | ICD-10-CM | POA: Diagnosis not present

## 2023-01-04 DIAGNOSIS — E1122 Type 2 diabetes mellitus with diabetic chronic kidney disease: Secondary | ICD-10-CM | POA: Diagnosis not present

## 2023-01-04 DIAGNOSIS — K922 Gastrointestinal hemorrhage, unspecified: Secondary | ICD-10-CM | POA: Diagnosis not present

## 2023-01-04 DIAGNOSIS — Q2730 Arteriovenous malformation, site unspecified: Secondary | ICD-10-CM | POA: Diagnosis not present

## 2023-01-04 DIAGNOSIS — I5033 Acute on chronic diastolic (congestive) heart failure: Secondary | ICD-10-CM | POA: Diagnosis not present

## 2023-01-05 ENCOUNTER — Inpatient Hospital Stay: Payer: Medicare Other

## 2023-01-05 VITALS — BP 135/60 | HR 84 | Temp 97.3°F | Resp 18

## 2023-01-05 DIAGNOSIS — D631 Anemia in chronic kidney disease: Secondary | ICD-10-CM

## 2023-01-05 DIAGNOSIS — D500 Iron deficiency anemia secondary to blood loss (chronic): Secondary | ICD-10-CM

## 2023-01-05 DIAGNOSIS — D649 Anemia, unspecified: Secondary | ICD-10-CM

## 2023-01-05 DIAGNOSIS — K5520 Angiodysplasia of colon without hemorrhage: Secondary | ICD-10-CM | POA: Diagnosis not present

## 2023-01-05 DIAGNOSIS — Z87891 Personal history of nicotine dependence: Secondary | ICD-10-CM | POA: Diagnosis not present

## 2023-01-05 DIAGNOSIS — E611 Iron deficiency: Secondary | ICD-10-CM | POA: Diagnosis not present

## 2023-01-05 DIAGNOSIS — N1831 Chronic kidney disease, stage 3a: Secondary | ICD-10-CM | POA: Diagnosis not present

## 2023-01-05 DIAGNOSIS — E1122 Type 2 diabetes mellitus with diabetic chronic kidney disease: Secondary | ICD-10-CM | POA: Diagnosis not present

## 2023-01-05 LAB — HEMOGLOBIN AND HEMATOCRIT (CANCER CENTER ONLY)
HCT: 26.8 % — ABNORMAL LOW (ref 39.0–52.0)
Hemoglobin: 8.1 g/dL — ABNORMAL LOW (ref 13.0–17.0)

## 2023-01-05 MED ORDER — SODIUM CHLORIDE 0.9 % IV SOLN
INTRAVENOUS | Status: DC
  Filled 2023-01-05: qty 250

## 2023-01-05 MED ORDER — SODIUM CHLORIDE 0.9 % IV SOLN
200.0000 mg | Freq: Once | INTRAVENOUS | Status: AC
  Administered 2023-01-05: 200 mg via INTRAVENOUS
  Filled 2023-01-05: qty 10

## 2023-01-05 NOTE — Patient Instructions (Signed)

## 2023-01-06 ENCOUNTER — Other Ambulatory Visit: Payer: Self-pay | Admitting: Oncology

## 2023-01-06 DIAGNOSIS — D631 Anemia in chronic kidney disease: Secondary | ICD-10-CM

## 2023-01-07 ENCOUNTER — Telehealth: Payer: Self-pay

## 2023-01-07 NOTE — Telephone Encounter (Signed)
Lamar Sprinkles can you add lab encounter and +/- retacrit around 3/18 and notify of time.  I will call patient and let him know that he needs this.

## 2023-01-07 NOTE — Telephone Encounter (Signed)
-----   Message from Earlie Server, MD sent at 01/06/2023 11:05 PM EDT ----- H&H is still trending down. Please add lab encounter around 3/18, lab +/- Retacrit. Thanks. I have ordered labs for 3/18

## 2023-01-10 DIAGNOSIS — J449 Chronic obstructive pulmonary disease, unspecified: Secondary | ICD-10-CM | POA: Diagnosis not present

## 2023-01-10 DIAGNOSIS — D500 Iron deficiency anemia secondary to blood loss (chronic): Secondary | ICD-10-CM | POA: Diagnosis not present

## 2023-01-10 DIAGNOSIS — G35 Multiple sclerosis: Secondary | ICD-10-CM | POA: Diagnosis not present

## 2023-01-10 DIAGNOSIS — I129 Hypertensive chronic kidney disease with stage 1 through stage 4 chronic kidney disease, or unspecified chronic kidney disease: Secondary | ICD-10-CM | POA: Diagnosis not present

## 2023-01-10 DIAGNOSIS — Z9981 Dependence on supplemental oxygen: Secondary | ICD-10-CM | POA: Diagnosis not present

## 2023-01-10 DIAGNOSIS — K5520 Angiodysplasia of colon without hemorrhage: Secondary | ICD-10-CM | POA: Diagnosis not present

## 2023-01-10 DIAGNOSIS — E1122 Type 2 diabetes mellitus with diabetic chronic kidney disease: Secondary | ICD-10-CM | POA: Diagnosis not present

## 2023-01-10 DIAGNOSIS — Z980 Intestinal bypass and anastomosis status: Secondary | ICD-10-CM | POA: Diagnosis not present

## 2023-01-10 DIAGNOSIS — D509 Iron deficiency anemia, unspecified: Secondary | ICD-10-CM | POA: Diagnosis not present

## 2023-01-10 DIAGNOSIS — K6389 Other specified diseases of intestine: Secondary | ICD-10-CM | POA: Diagnosis not present

## 2023-01-10 DIAGNOSIS — R933 Abnormal findings on diagnostic imaging of other parts of digestive tract: Secondary | ICD-10-CM | POA: Diagnosis not present

## 2023-01-10 DIAGNOSIS — Z8616 Personal history of COVID-19: Secondary | ICD-10-CM | POA: Diagnosis not present

## 2023-01-10 DIAGNOSIS — Z9049 Acquired absence of other specified parts of digestive tract: Secondary | ICD-10-CM | POA: Diagnosis not present

## 2023-01-10 DIAGNOSIS — Z888 Allergy status to other drugs, medicaments and biological substances status: Secondary | ICD-10-CM | POA: Diagnosis not present

## 2023-01-10 DIAGNOSIS — K922 Gastrointestinal hemorrhage, unspecified: Secondary | ICD-10-CM | POA: Diagnosis not present

## 2023-01-10 DIAGNOSIS — N1831 Chronic kidney disease, stage 3a: Secondary | ICD-10-CM | POA: Diagnosis not present

## 2023-01-10 DIAGNOSIS — Z87891 Personal history of nicotine dependence: Secondary | ICD-10-CM | POA: Diagnosis not present

## 2023-01-10 DIAGNOSIS — D1339 Benign neoplasm of other parts of small intestine: Secondary | ICD-10-CM | POA: Diagnosis not present

## 2023-01-10 DIAGNOSIS — Z794 Long term (current) use of insulin: Secondary | ICD-10-CM | POA: Diagnosis not present

## 2023-01-12 ENCOUNTER — Other Ambulatory Visit: Payer: Self-pay | Admitting: Oncology

## 2023-01-12 ENCOUNTER — Other Ambulatory Visit: Payer: Self-pay

## 2023-01-12 ENCOUNTER — Inpatient Hospital Stay: Payer: Medicare Other

## 2023-01-12 ENCOUNTER — Telehealth: Payer: Self-pay

## 2023-01-12 VITALS — BP 128/55 | HR 76

## 2023-01-12 DIAGNOSIS — D631 Anemia in chronic kidney disease: Secondary | ICD-10-CM | POA: Diagnosis not present

## 2023-01-12 DIAGNOSIS — N1831 Chronic kidney disease, stage 3a: Secondary | ICD-10-CM | POA: Diagnosis not present

## 2023-01-12 DIAGNOSIS — Z87891 Personal history of nicotine dependence: Secondary | ICD-10-CM | POA: Diagnosis not present

## 2023-01-12 DIAGNOSIS — D500 Iron deficiency anemia secondary to blood loss (chronic): Secondary | ICD-10-CM

## 2023-01-12 DIAGNOSIS — E1122 Type 2 diabetes mellitus with diabetic chronic kidney disease: Secondary | ICD-10-CM | POA: Diagnosis not present

## 2023-01-12 DIAGNOSIS — E611 Iron deficiency: Secondary | ICD-10-CM | POA: Diagnosis not present

## 2023-01-12 DIAGNOSIS — K5520 Angiodysplasia of colon without hemorrhage: Secondary | ICD-10-CM | POA: Diagnosis not present

## 2023-01-12 LAB — CBC WITH DIFFERENTIAL/PLATELET
Abs Immature Granulocytes: 0.03 10*3/uL (ref 0.00–0.07)
Basophils Absolute: 0.1 10*3/uL (ref 0.0–0.1)
Basophils Relative: 1 %
Eosinophils Absolute: 0.5 10*3/uL (ref 0.0–0.5)
Eosinophils Relative: 7 %
HCT: 25.7 % — ABNORMAL LOW (ref 39.0–52.0)
Hemoglobin: 7.7 g/dL — ABNORMAL LOW (ref 13.0–17.0)
Immature Granulocytes: 0 %
Lymphocytes Relative: 13 %
Lymphs Abs: 0.9 10*3/uL (ref 0.7–4.0)
MCH: 29.7 pg (ref 26.0–34.0)
MCHC: 30 g/dL (ref 30.0–36.0)
MCV: 99.2 fL (ref 80.0–100.0)
Monocytes Absolute: 0.6 10*3/uL (ref 0.1–1.0)
Monocytes Relative: 9 %
Neutro Abs: 4.8 10*3/uL (ref 1.7–7.7)
Neutrophils Relative %: 70 %
Platelets: 186 10*3/uL (ref 150–400)
RBC: 2.59 MIL/uL — ABNORMAL LOW (ref 4.22–5.81)
RDW: 17.2 % — ABNORMAL HIGH (ref 11.5–15.5)
WBC: 6.9 10*3/uL (ref 4.0–10.5)
nRBC: 0 % (ref 0.0–0.2)

## 2023-01-12 LAB — PREPARE RBC (CROSSMATCH)

## 2023-01-12 LAB — HEPATIC FUNCTION PANEL
ALT: 11 U/L (ref 0–44)
AST: 32 U/L (ref 15–41)
Albumin: 2.8 g/dL — ABNORMAL LOW (ref 3.5–5.0)
Alkaline Phosphatase: 84 U/L (ref 38–126)
Bilirubin, Direct: 0.1 mg/dL (ref 0.0–0.2)
Indirect Bilirubin: 0.4 mg/dL (ref 0.3–0.9)
Total Bilirubin: 0.5 mg/dL (ref 0.3–1.2)
Total Protein: 6.6 g/dL (ref 6.5–8.1)

## 2023-01-12 LAB — RETIC PANEL
Immature Retic Fract: 24.8 % — ABNORMAL HIGH (ref 2.3–15.9)
RBC.: 2.6 MIL/uL — ABNORMAL LOW (ref 4.22–5.81)
Retic Count, Absolute: 151.1 10*3/uL (ref 19.0–186.0)
Retic Ct Pct: 5.8 % — ABNORMAL HIGH (ref 0.4–3.1)
Reticulocyte Hemoglobin: 23.9 pg — ABNORMAL LOW (ref >27.9)

## 2023-01-12 LAB — LACTATE DEHYDROGENASE: LDH: 120 U/L (ref 98–192)

## 2023-01-12 MED ORDER — EPOETIN ALFA-EPBX 10000 UNIT/ML IJ SOLN
30000.0000 [IU] | Freq: Once | INTRAMUSCULAR | Status: AC
  Administered 2023-01-12: 30000 [IU] via SUBCUTANEOUS
  Filled 2023-01-12: qty 3

## 2023-01-12 MED ORDER — DIPHENHYDRAMINE HCL 25 MG PO CAPS: 25.0000 mg | ORAL_CAPSULE | Freq: Once | ORAL | Status: AC

## 2023-01-12 NOTE — Telephone Encounter (Signed)
Pt in clinic today with hemoglobin 7.7. Per Dr. Tasia Catchings he will need blood transfusion tomorrow or Friday. Venofer weekly x3 (starting this week or next/ different day from retacrit).

## 2023-01-13 ENCOUNTER — Inpatient Hospital Stay: Payer: Medicare Other

## 2023-01-13 DIAGNOSIS — D631 Anemia in chronic kidney disease: Secondary | ICD-10-CM

## 2023-01-13 DIAGNOSIS — D500 Iron deficiency anemia secondary to blood loss (chronic): Secondary | ICD-10-CM

## 2023-01-13 LAB — HAPTOGLOBIN: Haptoglobin: 48 mg/dL (ref 32–363)

## 2023-01-14 ENCOUNTER — Inpatient Hospital Stay: Payer: Medicare Other

## 2023-01-14 ENCOUNTER — Inpatient Hospital Stay (HOSPITAL_BASED_OUTPATIENT_CLINIC_OR_DEPARTMENT_OTHER): Payer: Medicare Other | Admitting: Medical Oncology

## 2023-01-14 ENCOUNTER — Telehealth: Payer: Self-pay | Admitting: *Deleted

## 2023-01-14 ENCOUNTER — Ambulatory Visit: Payer: Self-pay | Admitting: *Deleted

## 2023-01-14 VITALS — BP 157/61 | HR 68 | Temp 99.9°F | Resp 18

## 2023-01-14 VITALS — BP 138/49 | HR 74 | Temp 99.4°F | Resp 18

## 2023-01-14 DIAGNOSIS — G35 Multiple sclerosis: Secondary | ICD-10-CM

## 2023-01-14 DIAGNOSIS — G35D Multiple sclerosis, unspecified: Secondary | ICD-10-CM

## 2023-01-14 DIAGNOSIS — D649 Anemia, unspecified: Secondary | ICD-10-CM

## 2023-01-14 DIAGNOSIS — D631 Anemia in chronic kidney disease: Secondary | ICD-10-CM | POA: Diagnosis not present

## 2023-01-14 DIAGNOSIS — K5520 Angiodysplasia of colon without hemorrhage: Secondary | ICD-10-CM | POA: Diagnosis not present

## 2023-01-14 DIAGNOSIS — N1831 Chronic kidney disease, stage 3a: Secondary | ICD-10-CM | POA: Diagnosis not present

## 2023-01-14 DIAGNOSIS — N189 Chronic kidney disease, unspecified: Secondary | ICD-10-CM | POA: Diagnosis not present

## 2023-01-14 DIAGNOSIS — D500 Iron deficiency anemia secondary to blood loss (chronic): Secondary | ICD-10-CM

## 2023-01-14 DIAGNOSIS — Z87891 Personal history of nicotine dependence: Secondary | ICD-10-CM | POA: Diagnosis not present

## 2023-01-14 DIAGNOSIS — E611 Iron deficiency: Secondary | ICD-10-CM | POA: Diagnosis not present

## 2023-01-14 DIAGNOSIS — E1122 Type 2 diabetes mellitus with diabetic chronic kidney disease: Secondary | ICD-10-CM | POA: Diagnosis not present

## 2023-01-14 MED ORDER — ACETAMINOPHEN 325 MG PO TABS
650.0000 mg | ORAL_TABLET | Freq: Once | ORAL | Status: AC
  Administered 2023-01-14: 650 mg via ORAL
  Filled 2023-01-14: qty 2

## 2023-01-14 MED ORDER — SODIUM CHLORIDE 0.9% IV SOLUTION
250.0000 mL | Freq: Once | INTRAVENOUS | Status: AC
  Administered 2023-01-14: 250 mL via INTRAVENOUS
  Filled 2023-01-14: qty 250

## 2023-01-14 MED ORDER — DIPHENHYDRAMINE HCL 50 MG/ML IJ SOLN
50.0000 mg | Freq: Once | INTRAMUSCULAR | Status: AC
  Administered 2023-01-14: 50 mg via INTRAVENOUS
  Filled 2023-01-14: qty 1

## 2023-01-14 NOTE — Patient Instructions (Signed)
Blood Transfusion, Adult, Care After After a blood transfusion, it is common to have: Bruising and soreness at the IV site. A headache. Follow these instructions at home: Your doctor may give you more instructions. If you have problems, contact your doctor. Insertion site care     Follow instructions from your doctor about how to take care of your insertion site. This is where an IV tube was put into your vein. Make sure you: Wash your hands with soap and water for at least 20 seconds before and after you change your bandage. If you cannot use soap and water, use hand sanitizer. Change your bandage as told by your doctor. Check your insertion site every day for signs of infection. Check for: Redness, swelling, or pain. Bleeding from the site. Warmth. Pus or a bad smell. General instructions Take over-the-counter and prescription medicines only as told by your doctor. Rest as told by your doctor. Go back to your normal activities as told by your doctor. Keep all follow-up visits. You may need to have tests at certain times to check your blood. Contact a doctor if: You have itching or red, swollen areas of skin (hives). You have a fever or chills. You have pain in the head, back, or chest. You feel worried or nervous (anxious). You feel weak after doing your normal activities. You have any of these problems at the insertion site: Redness, swelling, warmth, or pain. Bleeding that does not stop with pressure. Pus or a bad smell. If you received your blood transfusion in an outpatient setting, you will be told whom to contact to report any reactions. Get help right away if: You have signs of a serious reaction. This may be coming from an allergy or the body's defense system (immune system). Signs include: Trouble breathing or shortness of breath. Swelling of the face or feeling warm (flushed). A widespread rash. Dark pee (urine) or blood in the pee. Fast heartbeat. These symptoms  may be an emergency. Get help right away. Call 911. Do not wait to see if the symptoms will go away. Do not drive yourself to the hospital. Summary Bruising and soreness at the IV site are common. Check your insertion site every day for signs of infection. Rest as told by your doctor. Go back to your normal activities as told by your doctor. Get help right away if you have signs of a serious reaction. This information is not intended to replace advice given to you by your health care provider. Make sure you discuss any questions you have with your health care provider. Document Revised: 01/08/2022 Document Reviewed: 01/08/2022 Elsevier Patient Education  [place].  Blood Transfusion, Adult A blood transfusion is a procedure in which you receive blood or a type of blood cell (blood component) through an IV. You may need a blood transfusion when you have a low blood count, which is a low number of any blood cell. This may result from a bleeding disorder, illness, injury, or surgery. The blood may come from a donor, or you may be able to have your own blood collected and stored (autologous blood donation) before a planned surgery. The blood given in a transfusion may be made up of different blood components. You may receive: Red blood cells. These carry oxygen to the cells in the body. Platelets. These help your blood to clot. Plasma. This is the liquid part of your blood. It carries proteins and other substances throughout the body. White blood cells. These help  you fight infections. If you have hemophilia or another clotting disorder, you may also receive other types of blood products. Depending on the type of blood product, this procedure may take 1-4 hours to complete. Tell a health care provider about: Any bleeding problems you have. Any previous reactions you have had during a blood transfusion. Any allergies you have. All medicines you are taking, including vitamins, herbs, eye  drops, creams, and over-the-counter medicines. Any surgeries you have had. Any medical conditions you have. Whether you are pregnant or may be pregnant. What are the risks? Talk with your health care provider about risks. The most common problems include: A mild allergic reaction, such as red, swollen areas of skin (hives) and itching. Fever or chills. This may be the body's response to new blood cells received. This may occur during or up to 4 hours after the transfusion. More serious problems may include: A serious allergic reaction that causes difficulty breathing or swelling around the face and lips. Transfusion-associated circulatory overload (TACO), or too much fluid in the lungs. This may cause breathing problems. Transfusion-related acute lung injury (TRALI), which causes breathing difficulty and low oxygen in the blood. This can occur within hours of the transfusion or several days later. Iron overload. This can happen after receiving many blood transfusions over a period of time. Infection or virus being transmitted. This is rare because donated blood is carefully tested before it is given. Hemolytic transfusion reaction. This is rare. It happens when the body's defense system (immune system)tries to attack the new blood cells. Symptoms may include fever, chills, nausea, low blood pressure, and low back or chest pain. Transfusion-associated graft-versus-host disease (TAGVHD). This is rare. It happens when donated cells attack the body's healthy tissues. What happens before the procedure? You will have a blood test to check your blood type. This test is done to know what kind of blood your body will accept and to match it to the donor blood. If you are going to have a planned surgery, you may be able to do an autologous blood donation. This may be done in case you need to have a transfusion. You will have your temperature, blood pressure, and pulse checked before the transfusion. If you  have had an allergic reaction to a transfusion in the past, you may be given medicine to help prevent a reaction. This medicine may be given to you by mouth (orally) or through an IV. What happens during the procedure?  An IV will be inserted into one of your veins. The bag of blood will be attached to your IV. The blood will then enter through your vein. Your temperature, blood pressure, and pulse will be monitored during the transfusion. This monitoring is done to detect early signs of a transfusion reaction. Tell your nurse right away if you have any of these symptoms during the transfusion: Shortness of breath or trouble breathing. Chest or back pain. Fever or chills. Itching or hives. If you have any signs or symptoms of a reaction, your transfusion will be stopped and you may be given medicine. When the transfusion is complete, your IV will be removed. Pressure may be applied to the IV site for a few minutes. A bandage (dressing)will be applied. The procedure may vary among health care providers and hospitals. What happens after the procedure? Your temperature, blood pressure, pulse, breathing rate, and blood oxygen level will be monitored until you leave the hospital or clinic. Your blood may be tested to see how  you have responded to the transfusion. You may be warmed with fluids or blankets to maintain a normal body temperature. If you receive your blood transfusion in an outpatient setting, you will be told whom to contact to report any reactions. Where to find more information Visit the [employer]: redcross.org Summary A blood transfusion is a procedure in which you receive blood or a type of blood cell (blood component) through an IV. The blood given in a transfusion may be made up of different blood components. You may receive red blood cells, platelets, plasma, or white blood cells depending on the condition treated. Your temperature, blood pressure, and pulse will be  monitored before, during, and after the transfusion. After the transfusion, your blood may be tested to see how your body has responded. This information is not intended to replace advice given to you by your health care provider. Make sure you discuss any questions you have with your health care provider. Document Revised: 01/08/2022 Document Reviewed: 01/08/2022 Elsevier Patient Education  [place].

## 2023-01-14 NOTE — Telephone Encounter (Signed)
Message from scheduler that patient called reporting that he is too weak to come in for blood today. I called patient back and wife answered and I informed her per verbal order Dr Jeremy [name] that he needs to go to ER if he is too weak to come in for his blood. She relied, "ok"

## 2023-01-14 NOTE — Progress Notes (Signed)
Symptom Management [place] at [facility] Telephone:(336) [phone] Fax:(336) [phone]  Patient Care Team: Juline Patch, MD as PCP - General (Family Medicine) Marcial Pacas, MD as Consulting Physician (Neurology) Samara Deist, DPM as Consulting Physician (Podiatry) Lonia Farber, MD as Consulting Physician (Internal Medicine) Magnus Sinning, MD as Consulting Physician (Nephrology) Earlie Server, MD as Consulting Physician (Oncology) Valente David, RN as [place] Management   Name of the patient: Jeremy [name]  [id number]  08/29/1951   Oncological/Hematological History:  Symptomatic anemia 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 He is currently scheduled for a blood transfusion today however this morning he felt very weak and was unsure if he would be able to come to the clinic. He was advised to be transported to ER. He subsequently arrived at our office a short time later here for his blood transfusion. Upon discussing his symptoms with patient he reports progressive fatigue and generalized weakness over the past few weeks. Linked to his slow progressive gi bleed and subsequent anemia. He had a endoscopy earlier this week where 6 areas were cauterized. No bleeding episodes seen since. Of note he also had his MS injection yesterday which tends to make him fatigued and give him a low grade fever. He reports no cough, sore throat, SOB.   Denies any neurologic complaints. Denies recent fevers or illnesses. Denies any easy bleeding or bruising. Denies chest pain. Denies any nausea, vomiting, constipation, or diarrhea. Denies urinary complaints. Patient offers no further specific complaints  today.   PAST MEDICAL HISTORY: Past Medical History:  Diagnosis Date   Chronic pain    Depression    Diabetes (HCC)    GERD (gastroesophageal reflux disease)    Hyperlipemia    Hypertension    MS (multiple sclerosis) ([place])     PAST SURGICAL HISTORY:  Past Surgical History:  Procedure Laterality Date   COLECTOMY  06-2008   COLONOSCOPY  2015   normal   COLONOSCOPY WITH PROPOFOL N/A 09/10/2021   Procedure: COLONOSCOPY WITH PROPOFOL;  Surgeon: [name], MD;  Location: [facility] ENDOSCOPY;  Service: Endoscopy;  Laterality: N/A;   ESOPHAGOGASTRODUODENOSCOPY (EGD) WITH PROPOFOL N/A 09/10/2021   Procedure: ESOPHAGOGASTRODUODENOSCOPY (EGD) WITH PROPOFOL;  Surgeon: [name], MD;  Location: [facility] ENDOSCOPY;  Service: Endoscopy;  Laterality: N/A;   GIVENS CAPSULE STUDY N/A 09/25/2021   Procedure: GIVENS CAPSULE STUDY;  Surgeon: Jonathon Bellows, MD;  Location: [facility] ENDOSCOPY;  Service: Gastroenterology;  Laterality: N/A;    HEMATOLOGY/ONCOLOGY HISTORY:  Oncology History   No history exists.    ALLERGIES:  is allergic to betadine [povidone iodine].  MEDICATIONS:  Current Outpatient Medications  Medication Sig Dispense Refill   acetaminophen (TYLENOL) 500 MG tablet Take 500 mg by mouth every 6 (six) hours as needed for mild pain.     albuterol (VENTOLIN HFA) 108 (90 Base) MCG/ACT inhaler Inhale 2 puffs into the lungs in the morning, at noon, in the evening, and at bedtime.     B-D INSULIN SYRINGE 1CC/25GX1" 25G X 1" 1 ML MISC USE AS DIRECTED 10 each 2   cyanocobalamin (VITAMIN B12) 500 MCG tablet Take 1,000 mcg by mouth daily.     ferrous sulfate 325 (65 FE) MG  tablet Take 325 mg by mouth daily with breakfast.     furosemide (LASIX) 40 MG tablet Take 40 mg by mouth. 1/2 pill every other day- Singh     gemfibrozil (LOPID) 600 MG tablet Take 1 tablet (600 mg total) by mouth 2 (two) times daily before a meal. TAKE 1 TABLET BY MOUTH TWICE  DAILY BEFORE MEALS 180 tablet 1   insulin glargine  (LANTUS) 100 UNIT/ML Solostar Pen Inject 20 Units into the skin at bedtime. 15 mL 11   Interferon Beta-1b (BETASERON) 0.3 MG KIT injection Inject subcutaneously 1 syringe every other day. 45 kit 4   loratadine (CLARITIN) 10 MG tablet Take 1 tablet (10 mg total) by mouth daily. 90 tablet 1   metFORMIN (GLUCOPHAGE) 500 MG tablet Take 500 mg by mouth 2 (two) times daily.     mupirocin ointment (BACTROBAN) 2 % Apply 1 Application topically 2 (two) times daily. Apply to effected area bid 30 g 1   omeprazole (PRILOSEC) 40 MG capsule Take 1 capsule (40 mg total) by mouth daily. 90 capsule 1   pregabalin (LYRICA) 200 MG capsule Take 1 capsule (200 mg total) by mouth 2 (two) times daily. 60 capsule 5   sertraline (ZOLOFT) 50 MG tablet TAKE ONE-HALF TABLET BY MOUTH  DAILY 45 tablet 1   simvastatin (ZOCOR) 40 MG tablet Take 1 tablet (40 mg total) by mouth daily. 90 tablet 1   sodium bicarbonate 650 MG tablet Take 650 mg by mouth 2 (two) times daily.     traMADol (ULTRAM) 50 MG tablet Take 1 tablet (50 mg total) by mouth every 6 (six) hours as needed. 120 tablet 2   No current facility-administered medications for this visit.   Facility-Administered Medications Ordered in Other Visits  Medication Dose Route Frequency Provider Last Rate Last Admin   0.9 %  sodium chloride infusion   Intravenous Continuous Earlie Server, MD   Stopped at 11/04/22 1432   diphenhydrAMINE (BENADRYL) capsule 25 mg  25 mg Oral Once Earlie Server, MD       diphenhydrAMINE (BENADRYL) injection 50 mg  50 mg Intravenous Once Earlie Server, MD        VITAL SIGNS: There were no vitals taken for this visit. There were no vitals filed for this visit.  Estimated body mass index is 29.84 kg/m as calculated from the following:   Height as of 12/31/22: 5\' 10"  (1.778 m).   Weight as of 12/31/22: 208 lb (94.3 kg).  LABS: CBC:    Component Value Date/Time   WBC 6.9 01/12/2023 1323   HGB 7.7 (L) 01/12/2023 1323   HGB 8.1 (L) 01/05/2023 1318   HGB 9.6  (L) 02/15/2022 1416   HCT 25.7 (L) 01/12/2023 1323   HCT 29.3 (L) 02/15/2022 1416   PLT 186 01/12/2023 1323   PLT 261 02/15/2022 1416   MCV 99.2 01/12/2023 1323   MCV 94 02/15/2022 1416   MCV 94 10/10/2013 1130   NEUTROABS 4.8 01/12/2023 1323   NEUTROABS 3.8 02/15/2022 1416   NEUTROABS 2.0 10/10/2013 1130   LYMPHSABS 0.9 01/12/2023 1323   LYMPHSABS 0.8 02/15/2022 1416   LYMPHSABS 1.0 10/10/2013 1130   MONOABS 0.6 01/12/2023 1323   MONOABS 0.4 10/10/2013 1130   EOSABS 0.5 01/12/2023 1323   EOSABS 0.3 02/15/2022 1416   EOSABS 0.1 10/10/2013 1130   BASOSABS 0.1 01/12/2023 1323   BASOSABS 0.1 02/15/2022 1416   BASOSABS 0.1 10/10/2013 1130   Comprehensive Metabolic Panel:    Component Value  Date/Time   NA 140 11/26/2022 0520   NA 138 02/15/2022 1416   NA 143 08/31/2013 0417   K 4.0 11/26/2022 0520   K 3.5 08/31/2013 0417   CL 103 11/26/2022 0520   CL 109 (H) 08/31/2013 0417   CO2 31 11/26/2022 0520   CO2 28 08/31/2013 0417   BUN 28 (H) 11/26/2022 0520   BUN 33 (H) 02/15/2022 1416   BUN 39 (H) 08/31/2013 0417   CREATININE 1.27 (H) 11/26/2022 0520   CREATININE 1.53 (H) 08/31/2013 0417   GLUCOSE 92 11/26/2022 0520   GLUCOSE 133 (H) 08/31/2013 0417   CALCIUM 8.4 (L) 11/26/2022 0520   CALCIUM 8.9 08/31/2013 0417   AST 32 01/12/2023 1323   AST 35 08/31/2013 0417   ALT 11 01/12/2023 1323   ALT 15 08/31/2013 0417   ALKPHOS 84 01/12/2023 1323   ALKPHOS 60 08/31/2013 0417   BILITOT 0.5 01/12/2023 1323   BILITOT <0.2 12/25/2020 1058   BILITOT 0.6 08/31/2013 0417   PROT 6.6 01/12/2023 1323   PROT 6.9 12/25/2020 1058   PROT 6.2 (L) 08/31/2013 0417   ALBUMIN 2.8 (L) 01/12/2023 1323   ALBUMIN 3.6 (L) 02/15/2022 1416   ALBUMIN 2.9 (L) 08/31/2013 0417    RADIOGRAPHIC STUDIES: No results found.  PERFORMANCE STATUS (ECOG) : 1 - Symptomatic but completely ambulatory  Review of Systems Unless otherwise noted, a complete review of systems is negative.  Physical  Exam General: Frail, Sitting in wheelchair. AAx3 Cardiovascular: regular rate and rhythm Pulmonary: clear ant fields Abdomen: soft, nontender Extremities: no edema, no joint deformities Skin: no rashes, pallor Neurological: Weakness but otherwise nonfocal  Assessment and Plan- Patient is a 72 y.o. male    Encounter Diagnoses  Name Primary?   Anemia in chronic kidney disease, unspecified CKD stage Yes   Symptomatic anemia     Patient appears to have fatigue secondary to his slow progressive GI bleed with subsequent anemia. Happy to hear that GI intervention has already been performed and appears successful as they report no new episodes of bleeding. He likely is also not feeling the best today given his recent Betaseron injection- this is normal for patient. Today he is scheduled for a blood transfusion which I agree with. As he has not had any acute symptoms, no acute bleeding and has had GI repair I do not feel that addition cbc labs are needed today. Goal is to reduce blood work while so anemic. We will go ahead and perform the blood transfusion today and have him follow up next week as previous scheduled. ER should symptoms worsen over the weekend.   We will have an APP encounter added to his 01/19/2023 lab/infusion visit to check to ensure his Hgb has stabilized and that further blood products are not needed.   Patient expressed understanding and was in agreement with this plan. He also understands that He can call clinic at any time with any questions, concerns, or complaints.   Thank you for allowing me to participate in the care of this very pleasant patient.   Time Total: 25  Visit consisted of counseling and education dealing with the complex and emotionally intense issues of symptom management in the setting of serious illness.Greater than 50%  of this time was spent counseling and coordinating care related to the above assessment and plan.  Signed by: Nelwyn Salisbury,  PA-C

## 2023-01-14 NOTE — Patient Outreach (Signed)
  Care Coordination   Follow Up Visit Note   01/14/2023 Name: Jeremy [name] MRN: [id number] DOB: 1951/04/29  Jeremy [name] is a 72 y.o. year old male who sees Jeremy Patch, MD for primary care. I spoke with  Jeremy [name] by phone today.  What matters to the patients health and wellness today?  Patient has been weak, going to cancer center for labs and possible infusion/transfusion.  Per wife, state she has had a hard time lately getting patient in and out of the car due to his weakness, she is requesting assistance with transportation.  She is able to get him into his transport chair, would benefit from wheelchair capable transportation.      Goals Addressed             This Visit's Progress    Resolution of reurrent GI bleed   On track    Care Coordination Interventions: Evaluation of current treatment plan related to symptomatic anemia/recurrent GI bleed and patient's adherence to plan as established by provider Provided education to patient re: double tube endoscopy Reviewed medications with patient and discussed affordability and adherence Reviewed scheduled/upcoming provider appointments including cancer center on 3/26 Discussed plans with patient for ongoing care management follow up and provided patient with direct contact information for care management team         SDOH assessments and interventions completed:  No     Care Coordination Interventions:  Yes, provided   Interventions Today    Flowsheet Row Most Recent Value  Chronic Disease   Chronic disease during today's visit Other  [GI bleed]  General Interventions   General Interventions Discussed/Reviewed General Interventions Reviewed, Doctor Visits, Communication with  Doctor Visits Discussed/Reviewed Doctor Visits Reviewed, Specialist  PCP/Specialist Visits Compliance with follow-up visit  Communication with --  Jeremy [name] Guide for transportation]  Education Interventions   Education Provided Provided  Education  Provided Verbal Education On When to see the doctor  Safety Interventions   Safety Discussed/Reviewed Home Safety, Fall Risk  Home Safety Assistive Devices        Follow up plan: Referral made to care guide  Follow up call scheduled for 3/29    Encounter Outcome:  Pt. Visit Completed   Jeremy David, RN, MSN, [place] Management Care Management Coordinator [phone]

## 2023-01-14 NOTE — Patient Instructions (Signed)
Visit Information  Thank you for taking time to visit with me today. Please don't hesitate to contact me if I can be of assistance to you before our next scheduled telephone appointment.  Following are the goals we discussed today:  Listen for call from care guide regarding transportation assistance.   Our next appointment is by telephone on 3/29  Please call the care guide team at [phone] if you need to cancel or reschedule your appointment.   Please call the Suicide and Crisis Lifeline: 988 call the [place] National Suicide Prevention Lifeline: (954)627-0863 or TTY: 913-671-9236 TTY 250-806-1484) to talk to a trained counselor call 1-800-273-TALK (toll free, 24 hour hotline) call 911 if you are experiencing a Mental Health or [place] or need someone to talk to.  Patient verbalizes understanding of instructions and care plan provided today and agrees to view in [place]. Active MyChart status and patient understanding of how to access instructions and care plan via MyChart confirmed with patient.     The patient has been provided with contact information for the care management team and has been advised to call with any health related questions or concerns.   Valente David, RN, MSN, [place] Care Management Care Management Coordinator [phone]

## 2023-01-14 NOTE — Telephone Encounter (Signed)
   Telephone encounter was:  Unsuccessful.  01/14/2023 Name: Jeremy [name] MRN: [id number] DOB: 1951/05/29  Unsuccessful outbound call made today to assist with:  Transportation Needs   Outreach Attempt:  1st Attempt  A HIPAA compliant voice message was left requesting a return call.  Instructed patient to call back at [phone]. [facility] [phone] [address] , [place] [address] Email : [email or web address]

## 2023-01-15 LAB — TYPE AND SCREEN
ABO/RH(D): A POS
Antibody Screen: NEGATIVE
Unit division: 0

## 2023-01-15 LAB — BPAM RBC
Blood Product Expiration Date: 202404122359
ISSUE DATE / TIME: 202403221156
Unit Type and Rh: 6200

## 2023-01-17 DIAGNOSIS — E1122 Type 2 diabetes mellitus with diabetic chronic kidney disease: Secondary | ICD-10-CM | POA: Diagnosis not present

## 2023-01-17 DIAGNOSIS — I5033 Acute on chronic diastolic (congestive) heart failure: Secondary | ICD-10-CM | POA: Diagnosis not present

## 2023-01-17 DIAGNOSIS — K922 Gastrointestinal hemorrhage, unspecified: Secondary | ICD-10-CM | POA: Diagnosis not present

## 2023-01-17 DIAGNOSIS — I130 Hypertensive heart and chronic kidney disease with heart failure and stage 1 through stage 4 chronic kidney disease, or unspecified chronic kidney disease: Secondary | ICD-10-CM | POA: Diagnosis not present

## 2023-01-17 DIAGNOSIS — Q2730 Arteriovenous malformation, site unspecified: Secondary | ICD-10-CM | POA: Diagnosis not present

## 2023-01-17 DIAGNOSIS — D500 Iron deficiency anemia secondary to blood loss (chronic): Secondary | ICD-10-CM | POA: Diagnosis not present

## 2023-01-17 MED FILL — Iron Sucrose Inj 20 MG/ML (Fe Equiv): INTRAVENOUS | Qty: 10 | Status: AC

## 2023-01-18 ENCOUNTER — Telehealth: Payer: Self-pay | Admitting: *Deleted

## 2023-01-18 ENCOUNTER — Inpatient Hospital Stay: Payer: Medicare Other

## 2023-01-18 VITALS — BP 130/61 | HR 72 | Temp 99.6°F | Resp 18

## 2023-01-18 DIAGNOSIS — D500 Iron deficiency anemia secondary to blood loss (chronic): Secondary | ICD-10-CM

## 2023-01-18 DIAGNOSIS — D631 Anemia in chronic kidney disease: Secondary | ICD-10-CM

## 2023-01-18 DIAGNOSIS — Z87891 Personal history of nicotine dependence: Secondary | ICD-10-CM | POA: Diagnosis not present

## 2023-01-18 DIAGNOSIS — N1831 Chronic kidney disease, stage 3a: Secondary | ICD-10-CM | POA: Diagnosis not present

## 2023-01-18 DIAGNOSIS — K5520 Angiodysplasia of colon without hemorrhage: Secondary | ICD-10-CM | POA: Diagnosis not present

## 2023-01-18 DIAGNOSIS — E611 Iron deficiency: Secondary | ICD-10-CM | POA: Diagnosis not present

## 2023-01-18 DIAGNOSIS — E1122 Type 2 diabetes mellitus with diabetic chronic kidney disease: Secondary | ICD-10-CM | POA: Diagnosis not present

## 2023-01-18 MED ORDER — SODIUM CHLORIDE 0.9 % IV SOLN
INTRAVENOUS | Status: DC | PRN
  Filled 2023-01-18: qty 250

## 2023-01-18 MED ORDER — SODIUM CHLORIDE 0.9 % IV SOLN
200.0000 mg | Freq: Once | INTRAVENOUS | Status: AC
  Administered 2023-01-18: 200 mg via INTRAVENOUS
  Filled 2023-01-18: qty 200

## 2023-01-18 NOTE — Telephone Encounter (Signed)
   Telephone encounter was:  Unsuccessful.  01/18/2023 Name: Jeremy [name] MRN: [id number] DOB: 01-24-1951  Unsuccessful outbound call made today to assist with:  Transportation Needs   Outreach Attempt:  2nd Attempt   another message appt is today no return from friday message A HIPAA compliant voice message was left requesting a return call.  Instructed patient to call back at [phone]. [place] [phone] [address] , [place] [address] Email : [email or web address]

## 2023-01-18 NOTE — Progress Notes (Signed)
Patient declined to wait the 30 minutes for post iron infusion observation today.  Post iron education done. Patient verbalized understanding. 

## 2023-01-19 ENCOUNTER — Inpatient Hospital Stay: Payer: Medicare Other

## 2023-01-19 ENCOUNTER — Telehealth: Payer: Self-pay | Admitting: *Deleted

## 2023-01-19 VITALS — BP 153/59

## 2023-01-19 DIAGNOSIS — D500 Iron deficiency anemia secondary to blood loss (chronic): Secondary | ICD-10-CM

## 2023-01-19 DIAGNOSIS — E611 Iron deficiency: Secondary | ICD-10-CM | POA: Diagnosis not present

## 2023-01-19 DIAGNOSIS — D631 Anemia in chronic kidney disease: Secondary | ICD-10-CM | POA: Diagnosis not present

## 2023-01-19 DIAGNOSIS — N189 Chronic kidney disease, unspecified: Secondary | ICD-10-CM

## 2023-01-19 DIAGNOSIS — N1831 Chronic kidney disease, stage 3a: Secondary | ICD-10-CM | POA: Diagnosis not present

## 2023-01-19 DIAGNOSIS — E1122 Type 2 diabetes mellitus with diabetic chronic kidney disease: Secondary | ICD-10-CM | POA: Diagnosis not present

## 2023-01-19 DIAGNOSIS — D649 Anemia, unspecified: Secondary | ICD-10-CM

## 2023-01-19 DIAGNOSIS — K5520 Angiodysplasia of colon without hemorrhage: Secondary | ICD-10-CM | POA: Diagnosis not present

## 2023-01-19 DIAGNOSIS — Z87891 Personal history of nicotine dependence: Secondary | ICD-10-CM | POA: Diagnosis not present

## 2023-01-19 LAB — HEMOGLOBIN AND HEMATOCRIT (CANCER CENTER ONLY)
HCT: 30.1 % — ABNORMAL LOW (ref 39.0–52.0)
Hemoglobin: 9 g/dL — ABNORMAL LOW (ref 13.0–17.0)

## 2023-01-19 MED ORDER — EPOETIN ALFA-EPBX 10000 UNIT/ML IJ SOLN
30000.0000 [IU] | Freq: Once | INTRAMUSCULAR | Status: AC
  Administered 2023-01-19: 30000 [IU] via SUBCUTANEOUS
  Filled 2023-01-19: qty 3

## 2023-01-19 NOTE — Telephone Encounter (Signed)
   Telephone encounter was:  Unsuccessful.  01/19/2023 Name: Datwan Musselwhite MRN: [id number] DOB: 1951-07-21  Unsuccessful outbound call made today to assist with:  Transportation Needs   Outreach Attempt:  3rd Attempt.  Referral closed unable to contact patient.  A HIPAA compliant voice message was left requesting a return call.  Instructed patient to call back at [phone]. [place] [phone] [address] , [place] [address] Email : [email or web address]

## 2023-01-20 ENCOUNTER — Inpatient Hospital Stay: Payer: Medicare Other

## 2023-01-20 VITALS — BP 132/67 | HR 79 | Temp 97.8°F | Resp 18

## 2023-01-20 DIAGNOSIS — K5520 Angiodysplasia of colon without hemorrhage: Secondary | ICD-10-CM | POA: Diagnosis not present

## 2023-01-20 DIAGNOSIS — N1831 Chronic kidney disease, stage 3a: Secondary | ICD-10-CM | POA: Diagnosis not present

## 2023-01-20 DIAGNOSIS — D631 Anemia in chronic kidney disease: Secondary | ICD-10-CM

## 2023-01-20 DIAGNOSIS — Z87891 Personal history of nicotine dependence: Secondary | ICD-10-CM | POA: Diagnosis not present

## 2023-01-20 DIAGNOSIS — E611 Iron deficiency: Secondary | ICD-10-CM | POA: Diagnosis not present

## 2023-01-20 DIAGNOSIS — E1122 Type 2 diabetes mellitus with diabetic chronic kidney disease: Secondary | ICD-10-CM | POA: Diagnosis not present

## 2023-01-20 DIAGNOSIS — D500 Iron deficiency anemia secondary to blood loss (chronic): Secondary | ICD-10-CM

## 2023-01-20 MED ORDER — SODIUM CHLORIDE 0.9 % IV SOLN
200.0000 mg | Freq: Once | INTRAVENOUS | Status: AC
  Administered 2023-01-20: 200 mg via INTRAVENOUS
  Filled 2023-01-20: qty 10

## 2023-01-20 MED ORDER — SODIUM CHLORIDE 0.9 % IV SOLN
Freq: Once | INTRAVENOUS | Status: AC
  Filled 2023-01-20: qty 250

## 2023-01-21 ENCOUNTER — Telehealth: Payer: Self-pay | Admitting: *Deleted

## 2023-01-21 DIAGNOSIS — I5033 Acute on chronic diastolic (congestive) heart failure: Secondary | ICD-10-CM | POA: Diagnosis not present

## 2023-01-21 DIAGNOSIS — E1122 Type 2 diabetes mellitus with diabetic chronic kidney disease: Secondary | ICD-10-CM | POA: Diagnosis not present

## 2023-01-21 DIAGNOSIS — D500 Iron deficiency anemia secondary to blood loss (chronic): Secondary | ICD-10-CM | POA: Diagnosis not present

## 2023-01-21 DIAGNOSIS — I130 Hypertensive heart and chronic kidney disease with heart failure and stage 1 through stage 4 chronic kidney disease, or unspecified chronic kidney disease: Secondary | ICD-10-CM | POA: Diagnosis not present

## 2023-01-21 DIAGNOSIS — K922 Gastrointestinal hemorrhage, unspecified: Secondary | ICD-10-CM | POA: Diagnosis not present

## 2023-01-21 DIAGNOSIS — Q2730 Arteriovenous malformation, site unspecified: Secondary | ICD-10-CM | POA: Diagnosis not present

## 2023-01-21 NOTE — Patient Outreach (Signed)
  Care Coordination   Follow Up Visit Note   01/21/2023 Name: Jeremy [name] MRN: [id number] DOB: 12/30/1950  Jeremy [name] is a 72 y.o. year old male who sees Juline Patch, MD for primary care. I spoke with  [name] by phone today.  What matters to the patients health and wellness today?  Report he is feeling better, stronger.  Not currently needing help with transportation but wanting resources for future purposes.  State he called care guide back but was unsuccessful.  Will collaborate with team for resources.      Goals Addressed             This Visit's Progress    Resolution of reurrent GI bleed   On track    Care Coordination Interventions: Evaluation of current treatment plan related to symptomatic anemia/recurrent GI bleed and patient's adherence to plan as established by provider Provided education to patient re: double tube endoscopy Reviewed medications with patient and discussed affordability and adherence Reviewed scheduled/upcoming provider appointments including pulmonary on 4/2, Cancer center on 4/3 and 4/4, and foot doctor on 4/22 Discussed plans with patient for ongoing care management follow up and provided patient with direct contact information for care management team          SDOH assessments and interventions completed:  No     Care Coordination Interventions:  Yes, provided   Interventions Today    Flowsheet Row Most Recent Value  Chronic Disease   Chronic disease during today's visit Other  General Interventions   General Interventions Discussed/Reviewed General Interventions Reviewed, Communication with  Doctor Visits Discussed/Reviewed Doctor Visits Reviewed  PCP/Specialist Visits Compliance with follow-up visit  Communication with --  Dossie Arbour Guide]  Education Interventions   Provided Verbal Education On Community Resources        Follow up plan: Follow up call scheduled for 5/1    Encounter Outcome:  Pt. Visit Completed    Valente David, RN, MSN, [place] Care Management Care Management Coordinator [phone]

## 2023-01-24 DIAGNOSIS — I5033 Acute on chronic diastolic (congestive) heart failure: Secondary | ICD-10-CM | POA: Diagnosis not present

## 2023-01-24 DIAGNOSIS — Q2730 Arteriovenous malformation, site unspecified: Secondary | ICD-10-CM | POA: Diagnosis not present

## 2023-01-24 DIAGNOSIS — E1122 Type 2 diabetes mellitus with diabetic chronic kidney disease: Secondary | ICD-10-CM | POA: Diagnosis not present

## 2023-01-24 DIAGNOSIS — K922 Gastrointestinal hemorrhage, unspecified: Secondary | ICD-10-CM | POA: Diagnosis not present

## 2023-01-24 DIAGNOSIS — D500 Iron deficiency anemia secondary to blood loss (chronic): Secondary | ICD-10-CM | POA: Diagnosis not present

## 2023-01-24 DIAGNOSIS — I130 Hypertensive heart and chronic kidney disease with heart failure and stage 1 through stage 4 chronic kidney disease, or unspecified chronic kidney disease: Secondary | ICD-10-CM | POA: Diagnosis not present

## 2023-01-25 DIAGNOSIS — R918 Other nonspecific abnormal finding of lung field: Secondary | ICD-10-CM | POA: Diagnosis not present

## 2023-01-25 DIAGNOSIS — Z9981 Dependence on supplemental oxygen: Secondary | ICD-10-CM | POA: Diagnosis not present

## 2023-01-25 DIAGNOSIS — J449 Chronic obstructive pulmonary disease, unspecified: Secondary | ICD-10-CM | POA: Diagnosis not present

## 2023-01-25 DIAGNOSIS — R0602 Shortness of breath: Secondary | ICD-10-CM | POA: Diagnosis not present

## 2023-01-25 DIAGNOSIS — J9809 Other diseases of bronchus, not elsewhere classified: Secondary | ICD-10-CM | POA: Diagnosis not present

## 2023-01-25 DIAGNOSIS — J849 Interstitial pulmonary disease, unspecified: Secondary | ICD-10-CM | POA: Diagnosis not present

## 2023-01-26 ENCOUNTER — Inpatient Hospital Stay: Payer: Medicare Other | Attending: Oncology

## 2023-01-26 ENCOUNTER — Inpatient Hospital Stay: Payer: Medicare Other

## 2023-01-26 ENCOUNTER — Telehealth: Payer: Self-pay | Admitting: *Deleted

## 2023-01-26 VITALS — BP 135/54 | HR 77

## 2023-01-26 DIAGNOSIS — N189 Chronic kidney disease, unspecified: Secondary | ICD-10-CM

## 2023-01-26 DIAGNOSIS — Q2730 Arteriovenous malformation, site unspecified: Secondary | ICD-10-CM | POA: Diagnosis not present

## 2023-01-26 DIAGNOSIS — D631 Anemia in chronic kidney disease: Secondary | ICD-10-CM | POA: Diagnosis not present

## 2023-01-26 DIAGNOSIS — N1831 Chronic kidney disease, stage 3a: Secondary | ICD-10-CM | POA: Diagnosis not present

## 2023-01-26 DIAGNOSIS — I130 Hypertensive heart and chronic kidney disease with heart failure and stage 1 through stage 4 chronic kidney disease, or unspecified chronic kidney disease: Secondary | ICD-10-CM | POA: Diagnosis not present

## 2023-01-26 DIAGNOSIS — D500 Iron deficiency anemia secondary to blood loss (chronic): Secondary | ICD-10-CM

## 2023-01-26 DIAGNOSIS — E611 Iron deficiency: Secondary | ICD-10-CM | POA: Diagnosis not present

## 2023-01-26 DIAGNOSIS — D649 Anemia, unspecified: Secondary | ICD-10-CM

## 2023-01-26 DIAGNOSIS — I5033 Acute on chronic diastolic (congestive) heart failure: Secondary | ICD-10-CM | POA: Diagnosis not present

## 2023-01-26 DIAGNOSIS — K922 Gastrointestinal hemorrhage, unspecified: Secondary | ICD-10-CM | POA: Diagnosis not present

## 2023-01-26 DIAGNOSIS — E1122 Type 2 diabetes mellitus with diabetic chronic kidney disease: Secondary | ICD-10-CM | POA: Diagnosis not present

## 2023-01-26 LAB — HEMOGLOBIN AND HEMATOCRIT (CANCER CENTER ONLY)
HCT: 27.4 % — ABNORMAL LOW (ref 39.0–52.0)
Hemoglobin: 8.2 g/dL — ABNORMAL LOW (ref 13.0–17.0)

## 2023-01-26 LAB — TYPE AND SCREEN
ABO/RH(D): A POS
Antibody Screen: NEGATIVE

## 2023-01-26 MED ORDER — EPOETIN ALFA-EPBX 10000 UNIT/ML IJ SOLN
30000.0000 [IU] | Freq: Once | INTRAMUSCULAR | Status: AC
  Administered 2023-01-26: 30000 [IU] via SUBCUTANEOUS
  Filled 2023-01-26: qty 3

## 2023-01-26 MED FILL — Iron Sucrose Inj 20 MG/ML (Fe Equiv): INTRAVENOUS | Qty: 10 | Status: AC

## 2023-01-26 NOTE — Telephone Encounter (Signed)
   Telephone encounter was:  Successful.  01/26/2023 Name: [name] MRN: [id number] DOB: [birth year]  Jeremy [name] is a 71 y.o. year old male who is a primary care patient of Juline Patch, MD . The community resource team was consulted for assistance with Transportation Needs  Provide the number for the thn transportation will mail information about [place] transportation options available to them  Care guide performed the following interventions: Follow up call placed to the patient to discuss status of referral.  Follow Up Plan:  No further follow up planned at this time. The patient has been provided with needed resources. Marland Kitchenj

## 2023-01-27 ENCOUNTER — Ambulatory Visit: Payer: Medicare Other | Admitting: Nurse Practitioner

## 2023-01-27 ENCOUNTER — Inpatient Hospital Stay (HOSPITAL_BASED_OUTPATIENT_CLINIC_OR_DEPARTMENT_OTHER): Payer: Medicare Other | Admitting: Nurse Practitioner

## 2023-01-27 ENCOUNTER — Inpatient Hospital Stay: Payer: Medicare Other

## 2023-01-27 ENCOUNTER — Encounter: Payer: Self-pay | Admitting: Nurse Practitioner

## 2023-01-27 VITALS — BP 131/56 | HR 74 | Temp 98.0°F | Resp 18

## 2023-01-27 VITALS — BP 148/56 | HR 79 | Temp 98.5°F | Resp 17

## 2023-01-27 DIAGNOSIS — Z7984 Long term (current) use of oral hypoglycemic drugs: Secondary | ICD-10-CM | POA: Diagnosis not present

## 2023-01-27 DIAGNOSIS — Z794 Long term (current) use of insulin: Secondary | ICD-10-CM | POA: Diagnosis not present

## 2023-01-27 DIAGNOSIS — J9611 Chronic respiratory failure with hypoxia: Secondary | ICD-10-CM | POA: Diagnosis not present

## 2023-01-27 DIAGNOSIS — E611 Iron deficiency: Secondary | ICD-10-CM | POA: Diagnosis not present

## 2023-01-27 DIAGNOSIS — K219 Gastro-esophageal reflux disease without esophagitis: Secondary | ICD-10-CM | POA: Diagnosis not present

## 2023-01-27 DIAGNOSIS — D689 Coagulation defect, unspecified: Secondary | ICD-10-CM | POA: Diagnosis not present

## 2023-01-27 DIAGNOSIS — D500 Iron deficiency anemia secondary to blood loss (chronic): Secondary | ICD-10-CM

## 2023-01-27 DIAGNOSIS — D631 Anemia in chronic kidney disease: Secondary | ICD-10-CM

## 2023-01-27 DIAGNOSIS — R911 Solitary pulmonary nodule: Secondary | ICD-10-CM | POA: Diagnosis not present

## 2023-01-27 DIAGNOSIS — G894 Chronic pain syndrome: Secondary | ICD-10-CM | POA: Diagnosis not present

## 2023-01-27 DIAGNOSIS — N1831 Chronic kidney disease, stage 3a: Secondary | ICD-10-CM

## 2023-01-27 DIAGNOSIS — I272 Pulmonary hypertension, unspecified: Secondary | ICD-10-CM | POA: Diagnosis not present

## 2023-01-27 DIAGNOSIS — I130 Hypertensive heart and chronic kidney disease with heart failure and stage 1 through stage 4 chronic kidney disease, or unspecified chronic kidney disease: Secondary | ICD-10-CM | POA: Diagnosis not present

## 2023-01-27 DIAGNOSIS — Z9181 History of falling: Secondary | ICD-10-CM | POA: Diagnosis not present

## 2023-01-27 DIAGNOSIS — I5033 Acute on chronic diastolic (congestive) heart failure: Secondary | ICD-10-CM | POA: Diagnosis not present

## 2023-01-27 DIAGNOSIS — Z9981 Dependence on supplemental oxygen: Secondary | ICD-10-CM | POA: Diagnosis not present

## 2023-01-27 DIAGNOSIS — J449 Chronic obstructive pulmonary disease, unspecified: Secondary | ICD-10-CM | POA: Diagnosis not present

## 2023-01-27 DIAGNOSIS — E785 Hyperlipidemia, unspecified: Secondary | ICD-10-CM | POA: Diagnosis not present

## 2023-01-27 DIAGNOSIS — Q2730 Arteriovenous malformation, site unspecified: Secondary | ICD-10-CM | POA: Diagnosis not present

## 2023-01-27 DIAGNOSIS — D649 Anemia, unspecified: Secondary | ICD-10-CM | POA: Diagnosis not present

## 2023-01-27 DIAGNOSIS — F32A Depression, unspecified: Secondary | ICD-10-CM | POA: Diagnosis not present

## 2023-01-27 DIAGNOSIS — E1122 Type 2 diabetes mellitus with diabetic chronic kidney disease: Secondary | ICD-10-CM | POA: Diagnosis not present

## 2023-01-27 DIAGNOSIS — E1140 Type 2 diabetes mellitus with diabetic neuropathy, unspecified: Secondary | ICD-10-CM | POA: Diagnosis not present

## 2023-01-27 DIAGNOSIS — K7469 Other cirrhosis of liver: Secondary | ICD-10-CM | POA: Diagnosis not present

## 2023-01-27 DIAGNOSIS — Z87891 Personal history of nicotine dependence: Secondary | ICD-10-CM | POA: Diagnosis not present

## 2023-01-27 DIAGNOSIS — I700 Atherosclerosis of aorta: Secondary | ICD-10-CM | POA: Diagnosis not present

## 2023-01-27 DIAGNOSIS — G35 Multiple sclerosis: Secondary | ICD-10-CM | POA: Diagnosis not present

## 2023-01-27 MED ORDER — SODIUM CHLORIDE 0.9 % IV SOLN
Freq: Once | INTRAVENOUS | Status: AC
  Filled 2023-01-27: qty 250

## 2023-01-27 MED ORDER — SODIUM CHLORIDE 0.9 % IV SOLN
200.0000 mg | Freq: Once | INTRAVENOUS | Status: AC
  Administered 2023-01-27: 200 mg via INTRAVENOUS
  Filled 2023-01-27: qty 200

## 2023-01-27 NOTE — Progress Notes (Signed)
Hematology/Oncology Progress Note [phone] [phone]   CHIEF COMPLAINTS/REASON FOR VISIT:  Follow  up for anemia  PERTINENT HEMATOLOGY HISTORY  Donielle Heckel is a 72 y.o. male who has above history reviewed by me today presents for follow up visit for anemia 09/24/2021,-09/27/2021, patient had hemoglobin dropped to 5.3 and was advised to go to emergency room be admitted.  Patient received 2 units of PRBC transfusion IV Venofer.    Endoscopy showed gastritis.  Small capsule study showed small bowel polyp.  His hemoglobin improved to 8.2 and patient was discharged.  #12/04/2021 Small Bowel Capsule Study Showed Nonbleeding AVM, esophagitis, gastritis, poor prep.  Small bowel polyp, area of erythema in colon obscured by poor prep.  # Admitted from 10/16/22- 10/17/22. He received PRBC transfusions, Hb improved at discharged to 7.1  INTERVAL HISTORY Ziv Buruca is a 72 y.o. male with above history of iron deficiency anemia who returns to clinic for follow up. He has chronic shortness of breath which is unchanged. Ongoing fatigue. Denies black or bloody stools. Denies interval thrombotic events. In interim, he underwent enteroscopy at 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   Review of Systems  Constitutional:  Positive for fatigue. Negative for appetite change, chills and fever.  HENT:   Negative for hearing loss and voice change.   Eyes:  Negative for eye problems and icterus.  Respiratory:  Positive for shortness of breath. Negative for chest tightness and cough.   Cardiovascular:  Negative for chest pain and leg swelling.  Gastrointestinal:  Negative for abdominal distention and abdominal pain.  Endocrine: Negative for hot flashes.  Genitourinary:  Negative for difficulty urinating, dysuria and frequency.   Musculoskeletal:  Negative for arthralgias.  Skin:  Negative for itching and rash.  Neurological:  Negative for light-headedness and numbness.  Hematological:   Negative for adenopathy. Does not bruise/bleed easily.  Psychiatric/Behavioral:  Negative for confusion.     MEDICAL HISTORY:  Past Medical History:  Diagnosis Date   Chronic pain    Depression    Diabetes    GERD (gastroesophageal reflux disease)    Hyperlipemia    Hypertension    MS (multiple sclerosis)     SURGICAL HISTORY: Past Surgical History:  Procedure Laterality Date   COLECTOMY  06-2008   COLONOSCOPY  2015   normal   COLONOSCOPY WITH PROPOFOL N/A 09/10/2021   Procedure: COLONOSCOPY WITH PROPOFOL;  Surgeon: Midge Minium, MD;  Location: [facility] ENDOSCOPY;  Service: Endoscopy;  Laterality: N/A;   ESOPHAGOGASTRODUODENOSCOPY (EGD) WITH PROPOFOL N/A 09/10/2021   Procedure: ESOPHAGOGASTRODUODENOSCOPY (EGD) WITH PROPOFOL;  Surgeon: Midge Minium, MD;  Location: [facility] ENDOSCOPY;  Service: Endoscopy;  Laterality: N/A;   GIVENS CAPSULE STUDY N/A 09/25/2021   Procedure: GIVENS CAPSULE STUDY;  Surgeon: Wyline Mood, MD;  [facility];  Service: Gastroenterology;  Laterality: N/A;    SOCIAL HISTORY: Social History   Socioeconomic History   Marital status: Married    Spouse name: Olegario Messier   Number of children: 2   Years of education: GED   Highest education level: Not on file  Occupational History    Comment: Disabled  Tobacco Use   Smoking status: Former    Packs/day: 1.50    Years: 30.00    Additional pack years: 0.00    Total pack years: 45.00    Types: Cigarettes    Quit date: 01/23/2006    Years since quitting: 17.0   Smokeless tobacco: Never   Tobacco comments:    N/A  Vaping Use  Vaping Use: Never used  Substance and Sexual Activity   Alcohol use: Not Currently    Comment: rare; maybe 2 beers a year   Drug use: No   Sexual activity: Not Currently  Other Topics Concern   Not on file  Social History Narrative   Patient is disabled.    Patient lives with his wife Glenard Newmann.    Patient has 2 children.       Social Determinants of Health    Financial Resource Strain: Low Risk  (11/19/2022)   Overall Financial Resource Strain (CARDIA)    Difficulty of Paying Living Expenses: Not hard at all  Food Insecurity: No Food Insecurity (12/14/2022)   Hunger Vital Sign    Worried About Running Out of Food in the Last Year: Never true    Ran Out of Food in the Last Year: Never true  Transportation Needs: No Transportation Needs (12/14/2022)   PRAPARE - Administrator, Civil Service (Medical): No    Lack of Transportation (Non-Medical): No  Physical Activity: Insufficiently Active (11/19/2022)   Exercise Vital Sign    Days of Exercise per Week: 3 days    Minutes of Exercise per Session: 20 min  Stress: No Stress Concern Present (11/19/2022)   [employer] of Occupational Health - Occupational Stress Questionnaire    Feeling of Stress : Not at all  Social Connections: Moderately Isolated (11/19/2022)   Social Connection and Isolation Panel [NHANES]    Frequency of Communication with Friends and Family: More than three times a week    Frequency of Social Gatherings with Friends and Family: More than three times a week    Attends Religious Services: Never    Database administrator or Organizations: No    Attends Banker Meetings: Never    Marital Status: Married  Catering manager Violence: Not At Risk (11/25/2022)   Humiliation, Afraid, Rape, and Kick questionnaire    Fear of Current or Ex-Partner: No    Emotionally Abused: No    Physically Abused: No    Sexually Abused: No    FAMILY HISTORY: Family History  Problem Relation Age of Onset   Lung cancer Mother    Heart attack Father    Heart attack Brother    COPD Brother    Diabetes Brother    Esophageal cancer Nephew     ALLERGIES:  is allergic to betadine [povidone iodine].  MEDICATIONS:  Current Outpatient Medications  Medication Sig Dispense Refill   acetaminophen (TYLENOL) 500 MG tablet Take 500 mg by mouth every 6 (six) hours as needed for  mild pain.     albuterol (VENTOLIN HFA) 108 (90 Base) MCG/ACT inhaler Inhale 2 puffs into the lungs in the morning, at noon, in the evening, and at bedtime.     B-D INSULIN SYRINGE 1CC/25GX1" 25G X 1" 1 ML MISC USE AS DIRECTED 10 each 2   cyanocobalamin (VITAMIN B12) 500 MCG tablet Take 1,000 mcg by mouth daily.     ferrous sulfate 325 (65 FE) MG tablet Take 325 mg by mouth daily with breakfast.     furosemide (LASIX) 40 MG tablet Take 40 mg by mouth. 1/2 pill every other day- Singh     gemfibrozil (LOPID) 600 MG tablet Take 1 tablet (600 mg total) by mouth 2 (two) times daily before a meal. TAKE 1 TABLET BY MOUTH TWICE  DAILY BEFORE MEALS 180 tablet 1   insulin glargine (LANTUS) 100 UNIT/ML Solostar Pen Inject 20  Units into the skin at bedtime. 15 mL 11   Interferon Beta-1b (BETASERON) 0.3 MG KIT injection Inject subcutaneously 1 syringe every other day. 45 kit 4   loratadine (CLARITIN) 10 MG tablet Take 1 tablet (10 mg total) by mouth daily. 90 tablet 1   metFORMIN (GLUCOPHAGE) 500 MG tablet Take 500 mg by mouth 2 (two) times daily.     mupirocin ointment (BACTROBAN) 2 % Apply 1 Application topically 2 (two) times daily. Apply to effected area bid 30 g 1   omeprazole (PRILOSEC) 40 MG capsule Take 1 capsule (40 mg total) by mouth daily. 90 capsule 1   pregabalin (LYRICA) 200 MG capsule Take 1 capsule (200 mg total) by mouth 2 (two) times daily. 60 capsule 5   sertraline (ZOLOFT) 50 MG tablet TAKE ONE-HALF TABLET BY MOUTH  DAILY 45 tablet 1   simvastatin (ZOCOR) 40 MG tablet Take 1 tablet (40 mg total) by mouth daily. 90 tablet 1   sodium bicarbonate 650 MG tablet Take 650 mg by mouth 2 (two) times daily.     traMADol (ULTRAM) 50 MG tablet Take 1 tablet (50 mg total) by mouth every 6 (six) hours as needed. 120 tablet 2   No current facility-administered medications for this visit.   Facility-Administered Medications Ordered in Other Visits  Medication Dose Route Frequency Provider Last Rate  Last Admin   0.9 %  sodium chloride infusion   Intravenous Continuous Rickard Patience, MD   Stopped at 11/04/22 1432   diphenhydrAMINE (BENADRYL) capsule 25 mg  25 mg Oral Once Rickard Patience, MD         PHYSICAL EXAMINATION: ECOG PERFORMANCE STATUS: 2 - Symptomatic, <50% confined to bed Vitals:   01/27/23 1405  BP: (!) 148/56  Pulse: 79  Resp: 17  Temp: 98.5 F (36.9 C)  SpO2: 90%   Filed Weights    Physical Exam Constitutional:      General: He is not in acute distress. HENT:     Head: Normocephalic and atraumatic.  Eyes:     General: No scleral icterus. Cardiovascular:     Rate and Rhythm: Normal rate and regular rhythm.     Heart sounds: Normal heart sounds.  Pulmonary:     Effort: Pulmonary effort is normal. No respiratory distress.     Breath sounds: No wheezing.     Comments: Decreased breath sound bilaterally.   Abdominal:     General: Bowel sounds are normal. There is no distension.     Palpations: Abdomen is soft.  Musculoskeletal:        General: No deformity. Normal range of motion.     Cervical back: Normal range of motion and neck supple.  Skin:    General: Skin is warm and dry.     Findings: No erythema or rash.  Neurological:     Mental Status: He is alert and oriented to person, place, and time. Mental status is at baseline.     Cranial Nerves: No cranial nerve deficit.     Coordination: Coordination normal.  Psychiatric:        Mood and Affect: Mood normal.    LABORATORY DATA:  I have reviewed the data as listed     Latest Ref Rng & Units 01/26/2023   12:45 PM 01/19/2023    1:11 PM 01/12/2023    1:23 PM  CBC  WBC 4.0 - 10.5 K/uL   6.9   Hemoglobin 13.0 - 17.0 g/dL 8.2  9.0  7.7  Hematocrit 39.0 - 52.0 % 27.4  30.1  25.7   Platelets 150 - 400 K/uL   186       Latest Ref Rng & Units 01/12/2023    1:23 PM 11/26/2022    5:20 AM 11/25/2022    4:33 AM  CMP  Glucose 70 - 99 mg/dL  92  90   BUN 8 - 23 mg/dL  28  33   Creatinine 1.61 - 1.24 mg/dL  0.96   0.45   Sodium 409 - 145 mmol/L  140  141   Potassium 3.5 - 5.1 mmol/L  4.0  3.6   Chloride 98 - 111 mmol/L  103  108   CO2 22 - 32 mmol/L  31  24   Calcium 8.9 - 10.3 mg/dL  8.4  8.0   Total Protein 6.5 - 8.1 g/dL 6.6     Total Bilirubin 0.3 - 1.2 mg/dL 0.5     Alkaline Phos 38 - 126 U/L 84     AST 15 - 41 U/L 32     ALT 0 - 44 U/L 11       Iron/TIBC/Ferritin/ %Sat    Component Value Date/Time   IRON 45 02/25/2023 1144   IRON 63 (L) 10/10/2013 1130   TIBC 344 02/25/2023 1144   TIBC 416 10/10/2013 1130   FERRITIN 117 02/25/2023 1144   FERRITIN 90 10/10/2013 1130   IRONPCTSAT 13 (L) 02/25/2023 1144   IRONPCTSAT 15 10/10/2013 1130     RADIOGRAPHIC STUDIES: I have personally reviewed the radiological images as listed and agreed with the findings in the report. No results found.   ASSESSMENT & PLAN:   Iron deficiency anemia due to chronic blood loss secondary to AVM, in the context of chronic kidney disease.  12/22/22 - hmg 8.5, ferritin 166, iron sat 16%.  12/29/22- venofer  3/7- retacrit 30k 01/05/23- Hmg 8.1; venofer 01/10/23- small bowel balloon endoscopy at [facility] with 6 angioectasias identified and treated APC. Polyp resected.  3/20- hmg worsened to 7.7. Retacrit 30k 3/22- saw Sarah, PA, no bloodwork. Transfused 1 unit pRBCs 3/26- venofer 3/27- Hmg 9.0, retacrit 30k 3/28- venofer 4/3- Hmg 8.2. retacrit 30k  Hemoglobin remains decreased on last lab check. He has not had significant response to 30,000 units of retacrit. Given ongoing anemia, need for transfusions, and iron optimization despite treatment with APC of small bowel avms, I recommend continuing twice a week venofer to replenish iron stores.   2. Anemia of CKD- stage 3b, now improved to gfr > 60. Hemoglobin persistently decreased despite previous improvements in iron stores. He has been taking retacrit 30,000 units and tolerating well. Plan to increase to 40,000 units weekly for hemoglobin < 10.   3. AVM- of small  bowel. Managed by [facility]. Continue follow up.   Disposition:  Venofer today Next week- Monday or Tuesday - Venofer Wednesday- lab (H&H), +/- retacrit 40,000 Thursday or Friday- Venofer 2 weeks- Monday or Tuesday- venofer Wednesday- lab (cbc, bmp, ferritin, iron studies), Dr Cathie Hoops, +/- retacrit 40,000 Thursday or Friday- venofer - la  All questions were answered. The patient knows to call the clinic with any problems, questions or concerns.  Consuello Masse, DNP, AGNP-C, AOCNP [facility] [phone] (clinic)

## 2023-01-27 NOTE — Progress Notes (Signed)
Patient here for oncology follow-up appointment, concerns of fatigue, injection/hemoglobin and chronic SOB

## 2023-01-28 ENCOUNTER — Telehealth: Payer: Self-pay

## 2023-01-28 ENCOUNTER — Telehealth: Payer: Self-pay | Admitting: Family Medicine

## 2023-01-28 ENCOUNTER — Ambulatory Visit: Payer: Medicare Other

## 2023-01-28 ENCOUNTER — Inpatient Hospital Stay: Payer: Medicare Other

## 2023-01-28 ENCOUNTER — Other Ambulatory Visit: Payer: Medicare Other

## 2023-01-28 NOTE — Telephone Encounter (Signed)
Copied from CRM [id number]. Topic: General - Other >> Jan 28, 2023 12:19 PM Yolanda T wrote: Reason for CRM: Tiffany with [facility] called to report a Level 1 drug interaction but she was not able to release the name of the drug to me as I am not clinical. She can be reached at [phone] option 2

## 2023-01-28 NOTE — Telephone Encounter (Signed)
Returned call and left message for Jeremy [name] to proceed with PT- left on vm. She is from [employer] [phone]

## 2023-01-28 NOTE — Telephone Encounter (Signed)
Copied from CRM [id number]. Topic: Referral - Request for Referral >> Jan 28, 2023  2:16 PM Alfred Levins wrote: Home Health Verbal Orders - Caller/Agency:  [facility]  Callback Number:  [phone] Physical Therapy Frequency: 2Xs A WEEK FOR 3 WEEKS AND 1X A WEEK FOR 5 WEEKS

## 2023-01-31 ENCOUNTER — Inpatient Hospital Stay: Payer: Medicare Other

## 2023-01-31 VITALS — BP 126/60 | HR 78 | Temp 96.2°F | Resp 18

## 2023-01-31 DIAGNOSIS — E611 Iron deficiency: Secondary | ICD-10-CM | POA: Diagnosis not present

## 2023-01-31 DIAGNOSIS — D631 Anemia in chronic kidney disease: Secondary | ICD-10-CM | POA: Diagnosis not present

## 2023-01-31 DIAGNOSIS — N1831 Chronic kidney disease, stage 3a: Secondary | ICD-10-CM | POA: Diagnosis not present

## 2023-01-31 DIAGNOSIS — D500 Iron deficiency anemia secondary to blood loss (chronic): Secondary | ICD-10-CM

## 2023-01-31 MED ORDER — SODIUM CHLORIDE 0.9 % IV SOLN
200.0000 mg | Freq: Once | INTRAVENOUS | Status: AC
  Administered 2023-01-31: 200 mg via INTRAVENOUS
  Filled 2023-01-31: qty 200

## 2023-01-31 MED ORDER — SODIUM CHLORIDE 0.9 % IV SOLN
INTRAVENOUS | Status: DC
  Filled 2023-01-31: qty 250

## 2023-02-01 ENCOUNTER — Other Ambulatory Visit: Payer: Self-pay

## 2023-02-01 DIAGNOSIS — D631 Anemia in chronic kidney disease: Secondary | ICD-10-CM

## 2023-02-02 ENCOUNTER — Inpatient Hospital Stay: Payer: Medicare Other

## 2023-02-02 ENCOUNTER — Other Ambulatory Visit: Payer: Self-pay | Admitting: Oncology

## 2023-02-02 VITALS — BP 145/63 | HR 76 | Temp 98.1°F | Resp 22

## 2023-02-02 DIAGNOSIS — J449 Chronic obstructive pulmonary disease, unspecified: Secondary | ICD-10-CM | POA: Diagnosis not present

## 2023-02-02 DIAGNOSIS — E611 Iron deficiency: Secondary | ICD-10-CM | POA: Diagnosis not present

## 2023-02-02 DIAGNOSIS — N1831 Chronic kidney disease, stage 3a: Secondary | ICD-10-CM | POA: Diagnosis not present

## 2023-02-02 DIAGNOSIS — E1122 Type 2 diabetes mellitus with diabetic chronic kidney disease: Secondary | ICD-10-CM | POA: Diagnosis not present

## 2023-02-02 DIAGNOSIS — D631 Anemia in chronic kidney disease: Secondary | ICD-10-CM | POA: Diagnosis not present

## 2023-02-02 DIAGNOSIS — D500 Iron deficiency anemia secondary to blood loss (chronic): Secondary | ICD-10-CM

## 2023-02-02 DIAGNOSIS — G35 Multiple sclerosis: Secondary | ICD-10-CM | POA: Diagnosis not present

## 2023-02-02 DIAGNOSIS — I5033 Acute on chronic diastolic (congestive) heart failure: Secondary | ICD-10-CM | POA: Diagnosis not present

## 2023-02-02 DIAGNOSIS — I130 Hypertensive heart and chronic kidney disease with heart failure and stage 1 through stage 4 chronic kidney disease, or unspecified chronic kidney disease: Secondary | ICD-10-CM | POA: Diagnosis not present

## 2023-02-02 LAB — TYPE AND SCREEN
ABO/RH(D): A POS
Antibody Screen: NEGATIVE

## 2023-02-02 LAB — HEMOGLOBIN AND HEMATOCRIT, BLOOD
HCT: 31.2 % — ABNORMAL LOW (ref 39.0–52.0)
Hemoglobin: 9.4 g/dL — ABNORMAL LOW (ref 13.0–17.0)

## 2023-02-02 MED ORDER — EPINEPHRINE 0.3 MG/0.3ML IJ SOAJ: 0.3000 mg | Freq: Once | INTRAMUSCULAR | Status: DC | PRN

## 2023-02-02 MED ORDER — FAMOTIDINE IN NACL 20-0.9 MG/50ML-% IV SOLN: 20.0000 mg | Freq: Once | INTRAVENOUS | Status: DC | PRN

## 2023-02-02 MED ORDER — HEPARIN SOD (PORK) LOCK FLUSH 100 UNIT/ML IV SOLN
250.0000 [IU] | Freq: Once | INTRAVENOUS | Status: DC | PRN
  Filled 2023-02-02: qty 5

## 2023-02-02 MED ORDER — EPOETIN ALFA-EPBX 40000 UNIT/ML IJ SOLN
40000.0000 [IU] | Freq: Once | INTRAMUSCULAR | Status: AC
  Administered 2023-02-02: 40000 [IU] via SUBCUTANEOUS
  Filled 2023-02-02: qty 1

## 2023-02-02 MED ORDER — SODIUM CHLORIDE 0.9% FLUSH
3.0000 mL | Freq: Once | INTRAVENOUS | Status: DC | PRN
  Filled 2023-02-02: qty 3

## 2023-02-02 MED ORDER — SODIUM CHLORIDE 0.9% FLUSH
10.0000 mL | Freq: Once | INTRAVENOUS | Status: DC | PRN
  Filled 2023-02-02: qty 10

## 2023-02-02 MED ORDER — ALBUTEROL SULFATE (2.5 MG/3ML) 0.083% IN NEBU
2.5000 mg | INHALATION_SOLUTION | Freq: Once | RESPIRATORY_TRACT | Status: DC | PRN
  Filled 2023-02-02: qty 3

## 2023-02-02 MED ORDER — ALTEPLASE 2 MG IJ SOLR
2.0000 mg | Freq: Once | INTRAMUSCULAR | Status: DC | PRN
  Filled 2023-02-02: qty 2

## 2023-02-02 MED ORDER — SODIUM CHLORIDE 0.9 % IV SOLN
Freq: Once | INTRAVENOUS | Status: DC | PRN
  Filled 2023-02-02: qty 250

## 2023-02-02 MED ORDER — EPOETIN ALFA-EPBX 10000 UNIT/ML IJ SOLN: 30000.0000 [IU] | Freq: Once | INTRAMUSCULAR | Status: DC

## 2023-02-02 MED ORDER — METHYLPREDNISOLONE SODIUM SUCC 125 MG IJ SOLR: 125.0000 mg | Freq: Once | INTRAMUSCULAR | Status: DC | PRN

## 2023-02-02 MED ORDER — HEPARIN SOD (PORK) LOCK FLUSH 100 UNIT/ML IV SOLN
500.0000 [IU] | Freq: Once | INTRAVENOUS | Status: DC | PRN
  Filled 2023-02-02: qty 5

## 2023-02-02 MED ORDER — DIPHENHYDRAMINE HCL 50 MG/ML IJ SOLN: 50.0000 mg | Freq: Once | INTRAMUSCULAR | Status: DC | PRN

## 2023-02-02 NOTE — Progress Notes (Signed)
RN released orders in supportive care for 30,000 units of retacrit. Rn noticed a note on the schedule today that dose would need to be increased to 40,000 units. Rn reviewed chart NP wrote in last note that pt's dose needed increase. I inquired about the dosage appropriate for the patient as this was not reflected in the supportive care plan for today. RN Spoke with Dr. Cathie Hoops and Consuello Masse, NP. Provider would like to increase dose to 40,000 units of retacrit today. Dr Cathie Hoops entered orders for the 40,000 units of retacrit. Patient and pt's wife updated that dose of the retacrit had changed from 30,000 to 40,000 units. Hgb today 9.4.

## 2023-02-03 DIAGNOSIS — G35 Multiple sclerosis: Secondary | ICD-10-CM | POA: Diagnosis not present

## 2023-02-03 DIAGNOSIS — J449 Chronic obstructive pulmonary disease, unspecified: Secondary | ICD-10-CM | POA: Diagnosis not present

## 2023-02-03 DIAGNOSIS — I5033 Acute on chronic diastolic (congestive) heart failure: Secondary | ICD-10-CM | POA: Diagnosis not present

## 2023-02-03 DIAGNOSIS — I130 Hypertensive heart and chronic kidney disease with heart failure and stage 1 through stage 4 chronic kidney disease, or unspecified chronic kidney disease: Secondary | ICD-10-CM | POA: Diagnosis not present

## 2023-02-03 DIAGNOSIS — N1831 Chronic kidney disease, stage 3a: Secondary | ICD-10-CM | POA: Diagnosis not present

## 2023-02-03 DIAGNOSIS — E1122 Type 2 diabetes mellitus with diabetic chronic kidney disease: Secondary | ICD-10-CM | POA: Diagnosis not present

## 2023-02-03 MED FILL — Iron Sucrose Inj 20 MG/ML (Fe Equiv): INTRAVENOUS | Qty: 10 | Status: AC

## 2023-02-04 ENCOUNTER — Other Ambulatory Visit: Payer: Medicare Other

## 2023-02-04 ENCOUNTER — Inpatient Hospital Stay: Payer: Medicare Other

## 2023-02-04 ENCOUNTER — Ambulatory Visit: Payer: Medicare Other

## 2023-02-04 VITALS — BP 142/56 | HR 79 | Temp 97.8°F | Resp 18

## 2023-02-04 DIAGNOSIS — E611 Iron deficiency: Secondary | ICD-10-CM | POA: Diagnosis not present

## 2023-02-04 DIAGNOSIS — N1831 Chronic kidney disease, stage 3a: Secondary | ICD-10-CM | POA: Diagnosis not present

## 2023-02-04 DIAGNOSIS — D631 Anemia in chronic kidney disease: Secondary | ICD-10-CM

## 2023-02-04 DIAGNOSIS — D500 Iron deficiency anemia secondary to blood loss (chronic): Secondary | ICD-10-CM

## 2023-02-04 MED ORDER — SODIUM CHLORIDE 0.9 % IV SOLN
INTRAVENOUS | Status: DC
  Filled 2023-02-04: qty 250

## 2023-02-04 MED ORDER — SODIUM CHLORIDE 0.9 % IV SOLN
200.0000 mg | Freq: Once | INTRAVENOUS | Status: AC
  Administered 2023-02-04: 200 mg via INTRAVENOUS
  Filled 2023-02-04: qty 200

## 2023-02-04 NOTE — Progress Notes (Signed)
Refused 30 minute post venofer observation. Vital signs stable at discharge.

## 2023-02-04 NOTE — Patient Instructions (Signed)

## 2023-02-07 ENCOUNTER — Inpatient Hospital Stay: Payer: Medicare Other

## 2023-02-07 VITALS — BP 159/58 | HR 85 | Temp 97.8°F | Resp 18

## 2023-02-07 DIAGNOSIS — N1831 Chronic kidney disease, stage 3a: Secondary | ICD-10-CM | POA: Diagnosis not present

## 2023-02-07 DIAGNOSIS — D631 Anemia in chronic kidney disease: Secondary | ICD-10-CM | POA: Diagnosis not present

## 2023-02-07 DIAGNOSIS — N189 Chronic kidney disease, unspecified: Secondary | ICD-10-CM

## 2023-02-07 DIAGNOSIS — D500 Iron deficiency anemia secondary to blood loss (chronic): Secondary | ICD-10-CM

## 2023-02-07 DIAGNOSIS — E611 Iron deficiency: Secondary | ICD-10-CM | POA: Diagnosis not present

## 2023-02-07 MED ORDER — SODIUM CHLORIDE 0.9 % IV SOLN
INTRAVENOUS | Status: DC
  Filled 2023-02-07: qty 250

## 2023-02-07 MED ORDER — SODIUM CHLORIDE 0.9 % IV SOLN
200.0000 mg | Freq: Once | INTRAVENOUS | Status: AC
  Administered 2023-02-07: 200 mg via INTRAVENOUS
  Filled 2023-02-07: qty 10

## 2023-02-07 NOTE — Progress Notes (Signed)
Pt has been educated and understands. Pt declined to stay 30 mins after iron infusion. VSS.  

## 2023-02-08 ENCOUNTER — Other Ambulatory Visit: Payer: Self-pay

## 2023-02-08 DIAGNOSIS — D631 Anemia in chronic kidney disease: Secondary | ICD-10-CM

## 2023-02-08 DIAGNOSIS — E1122 Type 2 diabetes mellitus with diabetic chronic kidney disease: Secondary | ICD-10-CM | POA: Diagnosis not present

## 2023-02-08 DIAGNOSIS — J449 Chronic obstructive pulmonary disease, unspecified: Secondary | ICD-10-CM | POA: Diagnosis not present

## 2023-02-08 DIAGNOSIS — I130 Hypertensive heart and chronic kidney disease with heart failure and stage 1 through stage 4 chronic kidney disease, or unspecified chronic kidney disease: Secondary | ICD-10-CM | POA: Diagnosis not present

## 2023-02-08 DIAGNOSIS — I5033 Acute on chronic diastolic (congestive) heart failure: Secondary | ICD-10-CM | POA: Diagnosis not present

## 2023-02-08 DIAGNOSIS — G35 Multiple sclerosis: Secondary | ICD-10-CM | POA: Diagnosis not present

## 2023-02-08 DIAGNOSIS — N1831 Chronic kidney disease, stage 3a: Secondary | ICD-10-CM | POA: Diagnosis not present

## 2023-02-09 ENCOUNTER — Inpatient Hospital Stay: Payer: Medicare Other

## 2023-02-09 ENCOUNTER — Other Ambulatory Visit: Payer: Medicare Other

## 2023-02-09 ENCOUNTER — Encounter: Payer: Self-pay | Admitting: Oncology

## 2023-02-09 ENCOUNTER — Ambulatory Visit: Payer: Medicare Other

## 2023-02-09 ENCOUNTER — Inpatient Hospital Stay (HOSPITAL_BASED_OUTPATIENT_CLINIC_OR_DEPARTMENT_OTHER): Payer: Medicare Other | Admitting: Oncology

## 2023-02-09 VITALS — BP 153/64 | HR 82 | Temp 97.6°F | Wt 192.7 lb

## 2023-02-09 DIAGNOSIS — D500 Iron deficiency anemia secondary to blood loss (chronic): Secondary | ICD-10-CM | POA: Diagnosis not present

## 2023-02-09 DIAGNOSIS — N189 Chronic kidney disease, unspecified: Secondary | ICD-10-CM | POA: Diagnosis not present

## 2023-02-09 DIAGNOSIS — Q2730 Arteriovenous malformation, site unspecified: Secondary | ICD-10-CM

## 2023-02-09 DIAGNOSIS — D631 Anemia in chronic kidney disease: Secondary | ICD-10-CM | POA: Diagnosis not present

## 2023-02-09 DIAGNOSIS — E611 Iron deficiency: Secondary | ICD-10-CM | POA: Diagnosis not present

## 2023-02-09 DIAGNOSIS — N1831 Chronic kidney disease, stage 3a: Secondary | ICD-10-CM | POA: Diagnosis not present

## 2023-02-09 LAB — CBC WITH DIFFERENTIAL (CANCER CENTER ONLY)
Abs Immature Granulocytes: 0.04 10*3/uL (ref 0.00–0.07)
Basophils Absolute: 0.1 10*3/uL (ref 0.0–0.1)
Basophils Relative: 1 %
Eosinophils Absolute: 0.3 10*3/uL (ref 0.0–0.5)
Eosinophils Relative: 4 %
HCT: 30.4 % — ABNORMAL LOW (ref 39.0–52.0)
Hemoglobin: 9.2 g/dL — ABNORMAL LOW (ref 13.0–17.0)
Immature Granulocytes: 1 %
Lymphocytes Relative: 12 %
Lymphs Abs: 0.9 10*3/uL (ref 0.7–4.0)
MCH: 29.8 pg (ref 26.0–34.0)
MCHC: 30.3 g/dL (ref 30.0–36.0)
MCV: 98.4 fL (ref 80.0–100.0)
Monocytes Absolute: 0.5 10*3/uL (ref 0.1–1.0)
Monocytes Relative: 7 %
Neutro Abs: 5.7 10*3/uL (ref 1.7–7.7)
Neutrophils Relative %: 75 %
Platelet Count: 253 10*3/uL (ref 150–400)
RBC: 3.09 MIL/uL — ABNORMAL LOW (ref 4.22–5.81)
RDW: 20.4 % — ABNORMAL HIGH (ref 11.5–15.5)
WBC Count: 7.5 10*3/uL (ref 4.0–10.5)
nRBC: 0 % (ref 0.0–0.2)

## 2023-02-09 LAB — BASIC METABOLIC PANEL - CANCER CENTER ONLY
Anion gap: 5 (ref 5–15)
BUN: 29 mg/dL — ABNORMAL HIGH (ref 8–23)
CO2: 24 mmol/L (ref 22–32)
Calcium: 8.6 mg/dL — ABNORMAL LOW (ref 8.9–10.3)
Chloride: 110 mmol/L (ref 98–111)
Creatinine: 1.17 mg/dL (ref 0.61–1.24)
GFR, Estimated: >60 mL/min (ref >60)
Glucose, Bld: 142 mg/dL — ABNORMAL HIGH (ref 70–99)
Potassium: 4.4 mmol/L (ref 3.5–5.1)
Sodium: 139 mmol/L (ref 135–145)

## 2023-02-09 LAB — IRON AND TIBC
Iron: 37 ug/dL — ABNORMAL LOW (ref 45–182)
Saturation Ratios: 14 % — ABNORMAL LOW (ref 17.9–39.5)
TIBC: 274 ug/dL (ref 250–450)
UIBC: 237 ug/dL

## 2023-02-09 LAB — FERRITIN: Ferritin: 293 ng/mL (ref 24–336)

## 2023-02-09 MED ORDER — EPOETIN ALFA-EPBX 40000 UNIT/ML IJ SOLN
40000.0000 [IU] | Freq: Once | INTRAMUSCULAR | Status: AC
  Administered 2023-02-09: 40000 [IU] via SUBCUTANEOUS
  Filled 2023-02-09: qty 1

## 2023-02-09 NOTE — Assessment & Plan Note (Addendum)
Retacrit 30,000 units x 1. Repeat labs every 3 weeks +/- Retacrit if hb<10

## 2023-02-09 NOTE — Assessment & Plan Note (Addendum)
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 Labs reviewed and discussed with patient. Lab Results  Component Value Date   HGB 9.2 (L) 02/09/2023   TIBC 274 02/09/2023   IRONPCTSAT 14 (L) 02/09/2023   FERRITIN 293 02/09/2023   Proceed with IV Venofer x 1, scheduled on 4/19

## 2023-02-09 NOTE — Progress Notes (Signed)
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 Labs reviewed and discussed with patient. Lab Results  Component Value Date   HGB 9.2 (L) 02/09/2023   TIBC 274 02/09/2023   IRONPCTSAT 14 (L) 02/09/2023   FERRITIN 293 02/09/2023   Proceed with IV Venofer x 1, scheduled on 4/19   Anemia in chronic kidney disease (CKD) Retacrit 30,000 units x 1. Repeat labs every 3 weeks +/- Retacrit if hb<10  Arteriovenous malformation (AVM) Follow-up with [facility] for further management.   Orders Placed This Encounter  Procedures   CBC with Differential (Cancer Center Only)    Standing Status:   Future    Standing Expiration Date:   02/09/2024   Retic Panel    Standing Status:   Future    Standing Expiration Date:   02/09/2024   CBC with Differential (Cancer Center Only)    Standing Status:   Future    Standing Expiration Date:   02/09/2024   Retic Panel    Standing Status:   Future    Standing Expiration Date:   02/09/2024   Follow-up her LOS. All questions were answered. The patient knows to call the clinic with any problems, questions or concerns.  Rickard Patience, MD, PhD [facility] Hematology Oncology 02/09/2023     PERTINENT HEMATOLOGY HISTORY  Jeremy [name] is a 72 y.o. male who has above history reviewed by me today presents for follow up visit for anemia 09/24/2021,-09/27/2021, patient had hemoglobin dropped to 5.3 and was advised to go to emergency room be admitted.  Patient received 2 units of PRBC transfusion IV Venofer.    Endoscopy showed gastritis.  Small capsule study showed small bowel polyp.  His hemoglobin improved to 8.2 and patient was discharged.  #12/04/2021 Small Bowel Capsule Study Showed  Nonbleeding AVM, esophagitis, gastritis, poor prep.  Small bowel polyp, area of erythema in colon obscured by poor prep.  # Admitted from 10/16/22- 10/17/22. He received PRBC transfusions, Hb improved at discharged to 7.1  INTERVAL HISTORY Jeremy [name] is a 72 y.o. male who has above history reviewed by me today presents for follow up visit for management of iron deficiency anemia. Today patient reports feeling okay.  He tolerates IV Venofer treatments.   Fatigue has improved.  S/p enteroscopy on 01/10/2023 at [facility],report is not available to me in care everywhere.  Ileal polyp removed, pathology showed Small intestinal mucosa with benign proliferation of capillary vessels WT1 immunostain is negative. The morphology and immunophenotype are compatible with patient's known history of venous malformation   Review of Systems  Constitutional:  Positive for fatigue. Negative for appetite change, chills and fever.  HENT:   Negative for hearing loss and voice change.   Eyes:  Negative for eye problems and icterus.  Respiratory:  Positive for shortness of breath. Negative for chest tightness and cough.   Cardiovascular:  Negative for chest pain and leg swelling.  Gastrointestinal:  Negative for abdominal distention and abdominal pain.  Endocrine: Negative for hot flashes.  Genitourinary:  Negative for difficulty urinating, dysuria and frequency.   Musculoskeletal:  Negative for arthralgias.  Skin:  Negative for itching and rash.  Neurological:  Negative for light-headedness and numbness.  Hematological:  Negative for adenopathy. Does not  bruise/bleed easily.  Psychiatric/Behavioral:  Negative for confusion.     MEDICAL HISTORY:  Past Medical History:  Diagnosis Date   Chronic pain    Depression    Diabetes    GERD (gastroesophageal reflux disease)    Hyperlipemia    Hypertension    MS (multiple sclerosis)     SURGICAL HISTORY: Past Surgical History:  Procedure Laterality Date    COLECTOMY  06-2008   COLONOSCOPY  2015   normal   COLONOSCOPY WITH PROPOFOL N/A 09/10/2021   Procedure: COLONOSCOPY WITH PROPOFOL;  Surgeon: Midge Minium, MD;  Location: [facility] ENDOSCOPY;  Service: Endoscopy;  Laterality: N/A;   ESOPHAGOGASTRODUODENOSCOPY (EGD) WITH PROPOFOL N/A 09/10/2021   Procedure: ESOPHAGOGASTRODUODENOSCOPY (EGD) WITH PROPOFOL;  Surgeon: Midge Minium, MD;  Location: [facility] ENDOSCOPY;  Service: Endoscopy;  Laterality: N/A;   GIVENS CAPSULE STUDY N/A 09/25/2021   Procedure: GIVENS CAPSULE STUDY;  Surgeon: Wyline Mood, MD;  Location: [facility] ENDOSCOPY;  Service: Gastroenterology;  Laterality: N/A;    SOCIAL HISTORY: Social History   Socioeconomic History   Marital status: Married    Spouse name: Jeremy [name]   Number of children: 2   Years of education: GED   Highest education level: Not on file  Occupational History    Comment: Disabled  Tobacco Use   Smoking status: Former    Packs/day: 1.50    Years: 30.00    Additional pack years: 0.00    Total pack years: 45.00    Types: Cigarettes    Quit date: 01/23/2006    Years since quitting: 17.0   Smokeless tobacco: Never   Tobacco comments:    N/A  Vaping Use   Vaping Use: Never used  Substance and Sexual Activity   Alcohol use: Not Currently    Comment: rare; maybe 2 beers a year   Drug use: No   Sexual activity: Not Currently  Other Topics Concern   Not on file  Social History Narrative   Patient is disabled.    Patient lives with his wife Jeremy [name].    Patient has 2 children.       Social Determinants of Health   Financial Resource Strain: Low Risk  (11/19/2022)   Overall Financial Resource Strain (CARDIA)    Difficulty of Paying Living Expenses: Not hard at all  Food Insecurity: No Food Insecurity (12/14/2022)   Hunger Vital Sign    Worried About Running Out of Food in the Last Year: Never true    Ran Out of Food in the Last Year: Never true  Transportation Needs: No Transportation Needs (12/14/2022)    PRAPARE - Administrator, Civil Service (Medical): No    Lack of Transportation (Non-Medical): No  Physical Activity: Insufficiently Active (11/19/2022)   Exercise Vital Sign    Days of Exercise per Week: 3 days    Minutes of Exercise per Session: 20 min  Stress: No Stress Concern Present (11/19/2022)   [employer] of Occupational Health - Occupational Stress Questionnaire    Feeling of Stress : Not at all  Social Connections: Moderately Isolated (11/19/2022)   Social Connection and Isolation Panel [NHANES]    Frequency of Communication with Friends and Family: More than three times a week    Frequency of Social Gatherings with Friends and Family: More than three times a week    Attends Religious Services: Never    Database administrator or Organizations: No    Attends Banker Meetings: Never    Marital Status:  Married  Intimate Partner Violence: Not At Risk (11/25/2022)   Humiliation, Afraid, Rape, and Kick questionnaire    Fear of Current or Ex-Partner: No    Emotionally Abused: No    Physically Abused: No    Sexually Abused: No    FAMILY HISTORY: Family History  Problem Relation Age of Onset   Lung cancer Mother    Heart attack Father    Heart attack Brother    COPD Brother    Diabetes Brother    Esophageal cancer Nephew     ALLERGIES:  is allergic to betadine [povidone iodine].  MEDICATIONS:  Current Outpatient Medications  Medication Sig Dispense Refill   acetaminophen (TYLENOL) 500 MG tablet Take 500 mg by mouth every 6 (six) hours as needed for mild pain.     albuterol (VENTOLIN HFA) 108 (90 Base) MCG/ACT inhaler Inhale 2 puffs into the lungs in the morning, at noon, in the evening, and at bedtime.     B-D INSULIN SYRINGE 1CC/25GX1" 25G X 1" 1 ML MISC USE AS DIRECTED 10 each 2   cyanocobalamin (VITAMIN B12) 500 MCG tablet Take 1,000 mcg by mouth daily.     furosemide (LASIX) 40 MG tablet Take 40 mg by mouth. 1/2 pill every other day- Singh      gemfibrozil (LOPID) 600 MG tablet Take 1 tablet (600 mg total) by mouth 2 (two) times daily before a meal. TAKE 1 TABLET BY MOUTH TWICE  DAILY BEFORE MEALS 180 tablet 1   insulin glargine (LANTUS) 100 UNIT/ML Solostar Pen Inject 20 Units into the skin at bedtime. 15 mL 11   Interferon Beta-1b (BETASERON) 0.3 MG KIT injection Inject subcutaneously 1 syringe every other day. 45 kit 4   loratadine (CLARITIN) 10 MG tablet Take 1 tablet (10 mg total) by mouth daily. 90 tablet 1   metFORMIN (GLUCOPHAGE) 500 MG tablet Take 500 mg by mouth 2 (two) times daily.     mupirocin ointment (BACTROBAN) 2 % Apply 1 Application topically 2 (two) times daily. Apply to effected area bid 30 g 1   omeprazole (PRILOSEC) 40 MG capsule Take 1 capsule (40 mg total) by mouth daily. 90 capsule 1   pregabalin (LYRICA) 200 MG capsule Take 1 capsule (200 mg total) by mouth 2 (two) times daily. 60 capsule 5   sertraline (ZOLOFT) 50 MG tablet TAKE ONE-HALF TABLET BY MOUTH  DAILY 45 tablet 1   simvastatin (ZOCOR) 40 MG tablet Take 1 tablet (40 mg total) by mouth daily. 90 tablet 1   sodium bicarbonate 650 MG tablet Take 650 mg by mouth 2 (two) times daily.     traMADol (ULTRAM) 50 MG tablet Take 1 tablet (50 mg total) by mouth every 6 (six) hours as needed. 120 tablet 2   ferrous sulfate 325 (65 FE) MG tablet Take 325 mg by mouth daily with breakfast. (Patient not taking: Reported on 02/09/2023)     No current facility-administered medications for this visit.   Facility-Administered Medications Ordered in Other Visits  Medication Dose Route Frequency Provider Last Rate Last Admin   0.9 %  sodium chloride infusion   Intravenous Continuous Rickard Patience, MD   Stopped at 11/04/22 1432   diphenhydrAMINE (BENADRYL) capsule 25 mg  25 mg Oral Once Rickard Patience, MD         PHYSICAL EXAMINATION: ECOG PERFORMANCE STATUS: 2 - Symptomatic, <50% confined to bed Vitals:   02/09/23 1440  BP: (!) 153/64  Pulse: 82  Temp: 97.6 F (36.4 C)  SpO2: 92%   Filed Weights   02/09/23 1440  Weight: 192 lb 11.2 oz (87.4 kg)    Physical Exam Constitutional:      General: He is not in acute distress. HENT:     Head: Normocephalic and atraumatic.  Eyes:     General: No scleral icterus. Cardiovascular:     Rate and Rhythm: Normal rate and regular rhythm.     Heart sounds: Normal heart sounds.  Pulmonary:     Effort: Pulmonary effort is normal. No respiratory distress.     Breath sounds: No wheezing.     Comments: Decreased breath sound bilaterally.   Abdominal:     General: Bowel sounds are normal. There is no distension.     Palpations: Abdomen is soft.  Musculoskeletal:        General: No deformity. Normal range of motion.     Cervical back: Normal range of motion and neck supple.  Skin:    General: Skin is warm and dry.     Findings: No erythema or rash.  Neurological:     Mental Status: He is alert and oriented to person, place, and time. Mental status is at baseline.     Cranial Nerves: No cranial nerve deficit.     Coordination: Coordination normal.  Psychiatric:        Mood and Affect: Mood normal.     LABORATORY DATA:  I have reviewed the data as listed     Latest Ref Rng & Units 02/09/2023    2:09 PM 02/02/2023   11:19 AM 01/26/2023   12:45 PM  CBC  WBC 4.0 - 10.5 K/uL 7.5     Hemoglobin 13.0 - 17.0 g/dL 9.2  9.4  8.2   Hematocrit 39.0 - 52.0 % 30.4  31.2  27.4   Platelets 150 - 400 K/uL 253         Latest Ref Rng & Units 02/09/2023    2:09 PM 01/12/2023    1:23 PM 11/26/2022    5:20 AM  CMP  Glucose 70 - 99 mg/dL 119   92   BUN 8 - 23 mg/dL 29   28   Creatinine 1.47 - 1.24 mg/dL 8.29   5.62   Sodium 130 - 145 mmol/L 139   140   Potassium 3.5 - 5.1 mmol/L 4.4   4.0   Chloride 98 - 111 mmol/L 110   103   CO2 22 - 32 mmol/L 24   31   Calcium 8.9 - 10.3 mg/dL 8.6   8.4   Total Protein 6.5 - 8.1 g/dL  6.6    Total Bilirubin 0.3 - 1.2 mg/dL  0.5    Alkaline Phos 38 - 126 U/L  84    AST 15 - 41 U/L   32    ALT 0 - 44 U/L  11      Iron/TIBC/Ferritin/ %Sat    Component Value Date/Time   IRON 37 (L) 02/09/2023 1409   IRON 63 (L) 10/10/2013 1130   TIBC 274 02/09/2023 1409   TIBC 416 10/10/2013 1130   FERRITIN 293 02/09/2023 1409   FERRITIN 90 10/10/2013 1130   IRONPCTSAT 14 (L) 02/09/2023 1409   IRONPCTSAT 15 10/10/2013 1130       RADIOGRAPHIC STUDIES: I have personally reviewed the radiological images as listed and agreed with the findings in the report. No results found.

## 2023-02-09 NOTE — Assessment & Plan Note (Signed)
Follow-up with [facility] for further management. 

## 2023-02-10 DIAGNOSIS — N1831 Chronic kidney disease, stage 3a: Secondary | ICD-10-CM | POA: Diagnosis not present

## 2023-02-10 DIAGNOSIS — I130 Hypertensive heart and chronic kidney disease with heart failure and stage 1 through stage 4 chronic kidney disease, or unspecified chronic kidney disease: Secondary | ICD-10-CM | POA: Diagnosis not present

## 2023-02-10 DIAGNOSIS — G35 Multiple sclerosis: Secondary | ICD-10-CM | POA: Diagnosis not present

## 2023-02-10 DIAGNOSIS — J449 Chronic obstructive pulmonary disease, unspecified: Secondary | ICD-10-CM | POA: Diagnosis not present

## 2023-02-10 DIAGNOSIS — E1122 Type 2 diabetes mellitus with diabetic chronic kidney disease: Secondary | ICD-10-CM | POA: Diagnosis not present

## 2023-02-10 DIAGNOSIS — I5033 Acute on chronic diastolic (congestive) heart failure: Secondary | ICD-10-CM | POA: Diagnosis not present

## 2023-02-10 MED FILL — Iron Sucrose Inj 20 MG/ML (Fe Equiv): INTRAVENOUS | Qty: 10 | Status: AC

## 2023-02-11 ENCOUNTER — Inpatient Hospital Stay: Payer: Medicare Other

## 2023-02-11 VITALS — BP 135/57 | HR 88 | Temp 99.4°F | Resp 18

## 2023-02-11 DIAGNOSIS — D631 Anemia in chronic kidney disease: Secondary | ICD-10-CM | POA: Diagnosis not present

## 2023-02-11 DIAGNOSIS — E611 Iron deficiency: Secondary | ICD-10-CM | POA: Diagnosis not present

## 2023-02-11 DIAGNOSIS — D500 Iron deficiency anemia secondary to blood loss (chronic): Secondary | ICD-10-CM

## 2023-02-11 DIAGNOSIS — N1831 Chronic kidney disease, stage 3a: Secondary | ICD-10-CM | POA: Diagnosis not present

## 2023-02-11 MED ORDER — SODIUM CHLORIDE 0.9 % IV SOLN
200.0000 mg | Freq: Once | INTRAVENOUS | Status: AC
  Administered 2023-02-11: 200 mg via INTRAVENOUS
  Filled 2023-02-11: qty 200

## 2023-02-11 MED ORDER — SODIUM CHLORIDE 0.9 % IV SOLN
INTRAVENOUS | Status: DC
  Filled 2023-02-11: qty 250

## 2023-02-11 NOTE — Patient Instructions (Signed)

## 2023-02-11 NOTE — Progress Notes (Signed)
Pt tolerated treatment c/o complaints.  VSS.  Pt refused 30 minute post observation.

## 2023-02-14 DIAGNOSIS — E1142 Type 2 diabetes mellitus with diabetic polyneuropathy: Secondary | ICD-10-CM | POA: Diagnosis not present

## 2023-02-14 DIAGNOSIS — B351 Tinea unguium: Secondary | ICD-10-CM | POA: Diagnosis not present

## 2023-02-16 DIAGNOSIS — E1122 Type 2 diabetes mellitus with diabetic chronic kidney disease: Secondary | ICD-10-CM | POA: Diagnosis not present

## 2023-02-16 DIAGNOSIS — I130 Hypertensive heart and chronic kidney disease with heart failure and stage 1 through stage 4 chronic kidney disease, or unspecified chronic kidney disease: Secondary | ICD-10-CM | POA: Diagnosis not present

## 2023-02-16 DIAGNOSIS — G35 Multiple sclerosis: Secondary | ICD-10-CM | POA: Diagnosis not present

## 2023-02-16 DIAGNOSIS — I5033 Acute on chronic diastolic (congestive) heart failure: Secondary | ICD-10-CM | POA: Diagnosis not present

## 2023-02-16 DIAGNOSIS — J449 Chronic obstructive pulmonary disease, unspecified: Secondary | ICD-10-CM | POA: Diagnosis not present

## 2023-02-16 DIAGNOSIS — N1831 Chronic kidney disease, stage 3a: Secondary | ICD-10-CM | POA: Diagnosis not present

## 2023-02-18 DIAGNOSIS — I5033 Acute on chronic diastolic (congestive) heart failure: Secondary | ICD-10-CM | POA: Diagnosis not present

## 2023-02-18 DIAGNOSIS — J449 Chronic obstructive pulmonary disease, unspecified: Secondary | ICD-10-CM | POA: Diagnosis not present

## 2023-02-18 DIAGNOSIS — G35 Multiple sclerosis: Secondary | ICD-10-CM | POA: Diagnosis not present

## 2023-02-18 DIAGNOSIS — N1831 Chronic kidney disease, stage 3a: Secondary | ICD-10-CM | POA: Diagnosis not present

## 2023-02-18 DIAGNOSIS — E1122 Type 2 diabetes mellitus with diabetic chronic kidney disease: Secondary | ICD-10-CM | POA: Diagnosis not present

## 2023-02-18 DIAGNOSIS — I130 Hypertensive heart and chronic kidney disease with heart failure and stage 1 through stage 4 chronic kidney disease, or unspecified chronic kidney disease: Secondary | ICD-10-CM | POA: Diagnosis not present

## 2023-02-23 ENCOUNTER — Ambulatory Visit: Payer: Self-pay | Admitting: *Deleted

## 2023-02-23 DIAGNOSIS — G35 Multiple sclerosis: Secondary | ICD-10-CM | POA: Diagnosis not present

## 2023-02-23 DIAGNOSIS — N1831 Chronic kidney disease, stage 3a: Secondary | ICD-10-CM | POA: Diagnosis not present

## 2023-02-23 DIAGNOSIS — E1122 Type 2 diabetes mellitus with diabetic chronic kidney disease: Secondary | ICD-10-CM | POA: Diagnosis not present

## 2023-02-23 DIAGNOSIS — I130 Hypertensive heart and chronic kidney disease with heart failure and stage 1 through stage 4 chronic kidney disease, or unspecified chronic kidney disease: Secondary | ICD-10-CM | POA: Diagnosis not present

## 2023-02-23 DIAGNOSIS — J449 Chronic obstructive pulmonary disease, unspecified: Secondary | ICD-10-CM | POA: Diagnosis not present

## 2023-02-23 DIAGNOSIS — I5033 Acute on chronic diastolic (congestive) heart failure: Secondary | ICD-10-CM | POA: Diagnosis not present

## 2023-02-23 NOTE — Patient Outreach (Signed)
  Care Coordination   Follow Up Visit Note   02/23/2023 Name: Jeremy [name] MRN: [id number] DOB: 1951-04-07  Jeremy [name] is a 72 y.o. year old male who sees Jeremy Limerick, MD for primary care. I spoke with  Jeremy [name] and wife by phone today.  What matters to the patients health and wellness today?  Wife report patient is still weak at times, history of MS along with anemia, feels wheelchair would help with transporting patient.     Goals Addressed             This Visit's Progress    Obtain wheelchair for better ability for wife to transport       Care Coordination Interventions: Evaluation of current treatment plan related to MS and difficulty with transporting patient and patient's adherence to plan as established by provider Advised patient to discuss need for wheelchair with PCP      Prevent falls   On track    Recommend to maintain adequate lighting in walkways, remove rugs that may cause slips or trips and continue to use cane for assistance with ambulation.     COMPLETED: Resolution of reurrent GI bleed   On track    Care Coordination Interventions: Evaluation of current treatment plan related to symptomatic anemia/recurrent GI bleed and patient's adherence to plan as established by provider Provided education to patient re: double tube endoscopy Reviewed medications with patient and discussed affordability and adherence Reviewed scheduled/upcoming provider appointments including pulmonary on 4/2, Cancer center on 4/3 and 4/4, and foot doctor on 4/22 Discussed plans with patient for ongoing care management follow up and provided patient with direct contact information for care management team  Update 5/1 - No recent GI bleed - goal met          SDOH assessments and interventions completed:  No     Care Coordination Interventions:  Yes, provided   Interventions Today    Flowsheet Row Most Recent Value  Chronic Disease   Chronic disease during today's  visit Other  [MS]  General Interventions   General Interventions Discussed/Reviewed Durable Medical Equipment (DME), General Interventions Reviewed, Communication with, Doctor Visits  Doctor Visits Discussed/Reviewed Doctor Visits Reviewed, PCP, Specialist  [Oncology on 5/9, pain clinic on 6/4, and PCP on 9/16]  Durable Medical Equipment (DME) Wheelchair  [Would like to have transport chair]  Wheelchair Standard  PCP/Specialist Visits Compliance with follow-up visit  Communication with PCP/Specialists  [Collaborate with PCP regarding need for transport chair]  Education Interventions   Education Provided Provided Education  Provided Verbal Education On When to see the doctor, Other  [Working with PT, will have home visit today, will possibly have sessions extended]        Follow up plan: Follow up call scheduled for 5/14    Encounter Outcome:  Pt. Visit Completed   Jeremy Durie, RN, MSN, [facility] Care Management Care Management Coordinator [phone]

## 2023-02-25 ENCOUNTER — Other Ambulatory Visit: Payer: Self-pay | Admitting: Oncology

## 2023-02-25 ENCOUNTER — Inpatient Hospital Stay: Payer: Medicare Other | Attending: Oncology

## 2023-02-25 DIAGNOSIS — D631 Anemia in chronic kidney disease: Secondary | ICD-10-CM | POA: Diagnosis not present

## 2023-02-25 DIAGNOSIS — E611 Iron deficiency: Secondary | ICD-10-CM | POA: Insufficient documentation

## 2023-02-25 DIAGNOSIS — N1831 Chronic kidney disease, stage 3a: Secondary | ICD-10-CM | POA: Insufficient documentation

## 2023-02-25 DIAGNOSIS — D500 Iron deficiency anemia secondary to blood loss (chronic): Secondary | ICD-10-CM

## 2023-02-25 DIAGNOSIS — D649 Anemia, unspecified: Secondary | ICD-10-CM

## 2023-02-25 LAB — CBC WITH DIFFERENTIAL (CANCER CENTER ONLY)
Abs Immature Granulocytes: 0.01 10*3/uL (ref 0.00–0.07)
Basophils Absolute: 0.1 10*3/uL (ref 0.0–0.1)
Basophils Relative: 1 %
Eosinophils Absolute: 0.3 10*3/uL (ref 0.0–0.5)
Eosinophils Relative: 5 %
HCT: 29.8 % — ABNORMAL LOW (ref 39.0–52.0)
Hemoglobin: 9.1 g/dL — ABNORMAL LOW (ref 13.0–17.0)
Immature Granulocytes: 0 %
Lymphocytes Relative: 11 %
Lymphs Abs: 0.6 10*3/uL — ABNORMAL LOW (ref 0.7–4.0)
MCH: 29.8 pg (ref 26.0–34.0)
MCHC: 30.5 g/dL (ref 30.0–36.0)
MCV: 97.7 fL (ref 80.0–100.0)
Monocytes Absolute: 0.4 10*3/uL (ref 0.1–1.0)
Monocytes Relative: 7 %
Neutro Abs: 4.2 10*3/uL (ref 1.7–7.7)
Neutrophils Relative %: 76 %
Platelet Count: 246 10*3/uL (ref 150–400)
RBC: 3.05 MIL/uL — ABNORMAL LOW (ref 4.22–5.81)
RDW: 18.2 % — ABNORMAL HIGH (ref 11.5–15.5)
WBC Count: 5.6 10*3/uL (ref 4.0–10.5)
nRBC: 0 % (ref 0.0–0.2)

## 2023-02-25 LAB — FERRITIN: Ferritin: 117 ng/mL (ref 24–336)

## 2023-02-25 LAB — IRON AND TIBC
Iron: 45 ug/dL (ref 45–182)
Saturation Ratios: 13 % — ABNORMAL LOW (ref 17.9–39.5)
TIBC: 344 ug/dL (ref 250–450)
UIBC: 299 ug/dL

## 2023-02-25 LAB — RETIC PANEL
Immature Retic Fract: 18.2 % — ABNORMAL HIGH (ref 2.3–15.9)
RBC.: 3.05 MIL/uL — ABNORMAL LOW (ref 4.22–5.81)
Retic Count, Absolute: 104 10*3/uL (ref 19.0–186.0)
Retic Ct Pct: 3.4 % — ABNORMAL HIGH (ref 0.4–3.1)
Reticulocyte Hemoglobin: 30.4 pg (ref >27.9)

## 2023-02-26 DIAGNOSIS — Z9981 Dependence on supplemental oxygen: Secondary | ICD-10-CM | POA: Diagnosis not present

## 2023-02-26 DIAGNOSIS — I130 Hypertensive heart and chronic kidney disease with heart failure and stage 1 through stage 4 chronic kidney disease, or unspecified chronic kidney disease: Secondary | ICD-10-CM | POA: Diagnosis not present

## 2023-02-26 DIAGNOSIS — I5033 Acute on chronic diastolic (congestive) heart failure: Secondary | ICD-10-CM | POA: Diagnosis not present

## 2023-02-26 DIAGNOSIS — G894 Chronic pain syndrome: Secondary | ICD-10-CM | POA: Diagnosis not present

## 2023-02-26 DIAGNOSIS — K219 Gastro-esophageal reflux disease without esophagitis: Secondary | ICD-10-CM | POA: Diagnosis not present

## 2023-02-26 DIAGNOSIS — Z7984 Long term (current) use of oral hypoglycemic drugs: Secondary | ICD-10-CM | POA: Diagnosis not present

## 2023-02-26 DIAGNOSIS — J449 Chronic obstructive pulmonary disease, unspecified: Secondary | ICD-10-CM | POA: Diagnosis not present

## 2023-02-26 DIAGNOSIS — E1140 Type 2 diabetes mellitus with diabetic neuropathy, unspecified: Secondary | ICD-10-CM | POA: Diagnosis not present

## 2023-02-26 DIAGNOSIS — E785 Hyperlipidemia, unspecified: Secondary | ICD-10-CM | POA: Diagnosis not present

## 2023-02-26 DIAGNOSIS — J9611 Chronic respiratory failure with hypoxia: Secondary | ICD-10-CM | POA: Diagnosis not present

## 2023-02-26 DIAGNOSIS — Z9181 History of falling: Secondary | ICD-10-CM | POA: Diagnosis not present

## 2023-02-26 DIAGNOSIS — K7469 Other cirrhosis of liver: Secondary | ICD-10-CM | POA: Diagnosis not present

## 2023-02-26 DIAGNOSIS — I272 Pulmonary hypertension, unspecified: Secondary | ICD-10-CM | POA: Diagnosis not present

## 2023-02-26 DIAGNOSIS — Z87891 Personal history of nicotine dependence: Secondary | ICD-10-CM | POA: Diagnosis not present

## 2023-02-26 DIAGNOSIS — Q2730 Arteriovenous malformation, site unspecified: Secondary | ICD-10-CM | POA: Diagnosis not present

## 2023-02-26 DIAGNOSIS — D500 Iron deficiency anemia secondary to blood loss (chronic): Secondary | ICD-10-CM | POA: Diagnosis not present

## 2023-02-26 DIAGNOSIS — G35 Multiple sclerosis: Secondary | ICD-10-CM | POA: Diagnosis not present

## 2023-02-26 DIAGNOSIS — F32A Depression, unspecified: Secondary | ICD-10-CM | POA: Diagnosis not present

## 2023-02-26 DIAGNOSIS — N1831 Chronic kidney disease, stage 3a: Secondary | ICD-10-CM | POA: Diagnosis not present

## 2023-02-26 DIAGNOSIS — R911 Solitary pulmonary nodule: Secondary | ICD-10-CM | POA: Diagnosis not present

## 2023-02-26 DIAGNOSIS — Z794 Long term (current) use of insulin: Secondary | ICD-10-CM | POA: Diagnosis not present

## 2023-02-26 DIAGNOSIS — D689 Coagulation defect, unspecified: Secondary | ICD-10-CM | POA: Diagnosis not present

## 2023-02-26 DIAGNOSIS — I700 Atherosclerosis of aorta: Secondary | ICD-10-CM | POA: Diagnosis not present

## 2023-02-26 DIAGNOSIS — E1122 Type 2 diabetes mellitus with diabetic chronic kidney disease: Secondary | ICD-10-CM | POA: Diagnosis not present

## 2023-03-03 ENCOUNTER — Inpatient Hospital Stay: Payer: Medicare Other

## 2023-03-03 ENCOUNTER — Ambulatory Visit: Payer: Medicare Other | Admitting: Oncology

## 2023-03-03 DIAGNOSIS — D500 Iron deficiency anemia secondary to blood loss (chronic): Secondary | ICD-10-CM

## 2023-03-03 DIAGNOSIS — D631 Anemia in chronic kidney disease: Secondary | ICD-10-CM | POA: Diagnosis not present

## 2023-03-03 DIAGNOSIS — N1831 Chronic kidney disease, stage 3a: Secondary | ICD-10-CM | POA: Diagnosis not present

## 2023-03-03 DIAGNOSIS — E611 Iron deficiency: Secondary | ICD-10-CM | POA: Diagnosis not present

## 2023-03-03 MED ORDER — HEPARIN SOD (PORK) LOCK FLUSH 100 UNIT/ML IV SOLN
500.0000 [IU] | Freq: Once | INTRAVENOUS | Status: DC | PRN
  Filled 2023-03-03: qty 5

## 2023-03-03 MED ORDER — SODIUM CHLORIDE 0.9% FLUSH
3.0000 mL | Freq: Once | INTRAVENOUS | Status: DC | PRN
  Filled 2023-03-03: qty 3

## 2023-03-03 MED ORDER — ALTEPLASE 2 MG IJ SOLR
2.0000 mg | Freq: Once | INTRAMUSCULAR | Status: DC | PRN
  Filled 2023-03-03: qty 2

## 2023-03-03 MED ORDER — SODIUM CHLORIDE 0.9 % IV SOLN
200.0000 mg | Freq: Once | INTRAVENOUS | Status: AC
  Administered 2023-03-03: 200 mg via INTRAVENOUS
  Filled 2023-03-03: qty 10

## 2023-03-03 MED ORDER — HEPARIN SOD (PORK) LOCK FLUSH 100 UNIT/ML IV SOLN
250.0000 [IU] | Freq: Once | INTRAVENOUS | Status: DC | PRN
  Filled 2023-03-03: qty 5

## 2023-03-03 MED ORDER — SODIUM CHLORIDE 0.9% FLUSH
10.0000 mL | Freq: Once | INTRAVENOUS | Status: DC | PRN
  Filled 2023-03-03: qty 10

## 2023-03-03 MED ORDER — SODIUM CHLORIDE 0.9 % IV SOLN
INTRAVENOUS | Status: DC
  Filled 2023-03-03: qty 250

## 2023-03-03 NOTE — Patient Instructions (Signed)

## 2023-03-04 DIAGNOSIS — J449 Chronic obstructive pulmonary disease, unspecified: Secondary | ICD-10-CM | POA: Diagnosis not present

## 2023-03-04 DIAGNOSIS — E1122 Type 2 diabetes mellitus with diabetic chronic kidney disease: Secondary | ICD-10-CM | POA: Diagnosis not present

## 2023-03-04 DIAGNOSIS — N1831 Chronic kidney disease, stage 3a: Secondary | ICD-10-CM | POA: Diagnosis not present

## 2023-03-04 DIAGNOSIS — I5033 Acute on chronic diastolic (congestive) heart failure: Secondary | ICD-10-CM | POA: Diagnosis not present

## 2023-03-04 DIAGNOSIS — I130 Hypertensive heart and chronic kidney disease with heart failure and stage 1 through stage 4 chronic kidney disease, or unspecified chronic kidney disease: Secondary | ICD-10-CM | POA: Diagnosis not present

## 2023-03-04 DIAGNOSIS — G35 Multiple sclerosis: Secondary | ICD-10-CM | POA: Diagnosis not present

## 2023-03-08 ENCOUNTER — Ambulatory Visit: Payer: Self-pay | Admitting: *Deleted

## 2023-03-08 NOTE — Patient Outreach (Signed)
  Care Coordination   Follow Up Visit Note   03/08/2023 Name: Colsen Eastman MRN: [id number] DOB: [birth year]  Aceyn Kohls is a 72 y.o. year old male who sees Duanne Limerick, MD for primary care. I spoke with  [name] by phone today.  What matters to the patients health and wellness today?  Get wheelchair, has not received call for DME yet.    Goals Addressed             This Visit's Progress    Obtain wheelchair for better ability for wife to transport   On track    Care Coordination Interventions: Evaluation of current treatment plan related to MS and difficulty with transporting patient and patient's adherence to plan as established by provider Advised patient to discuss need for wheelchair with PCP         SDOH assessments and interventions completed:  No     Care Coordination Interventions:  Yes, provided   Interventions Today    Flowsheet Row Most Recent Value  Chronic Disease   Chronic disease during today's visit Other  [MS/weakness]  General Interventions   General Interventions Discussed/Reviewed Durable Medical Equipment (DME), Communication with  Durable Medical Equipment (DME) Wheelchair  Wheelchair Standard  Communication with PCP/Specialists  [Reached out to T. Lynch at PCP office, order for wheelchair faxed to [employer]]        Follow up plan: Follow up call scheduled for 5/17    Encounter Outcome:  Pt. Visit Completed   Kemper Durie, RN, MSN, [facility] Care Management Care Management Coordinator [phone]

## 2023-03-09 ENCOUNTER — Telehealth: Payer: Self-pay | Admitting: *Deleted

## 2023-03-09 ENCOUNTER — Ambulatory Visit: Payer: Self-pay | Admitting: *Deleted

## 2023-03-09 NOTE — Patient Outreach (Signed)
  Care Coordination   Follow Up Visit Note   03/09/2023 Name: Khayree Calliham MRN: [id number] DOB: 08-18-1951  Enki Masley is a 72 y.o. year old male who sees Duanne Limerick, MD for primary care. I spoke with  [name] by phone today.  What matters to the patients health and wellness today?  Incoming call from PCP office, additional information needed to get wheelchair approved.     Goals Addressed             This Visit's Progress    Obtain wheelchair for better ability for wife to transport   On track    Care Coordination Interventions: Evaluation of current treatment plan related to MS and difficulty with transporting patient and patient's adherence to plan as established by provider Advised patient to discuss need for wheelchair with PCP         SDOH assessments and interventions completed:  No     Care Coordination Interventions:  Yes, provided   Interventions Today    Flowsheet Row Most Recent Value  Chronic Disease   Chronic disease during today's visit Other  [MS]  General Interventions   General Interventions Discussed/Reviewed Communication with  Durable Medical Equipment (DME) Wheelchair  Wheelchair Standard  Communication with PCP/Specialists  [spoke with PCP office, [employer] is requesting additional information regarding MS diagnosis.  Contacted neurology office to fax information.]       Follow up plan: Follow up call scheduled for 5/17    Encounter Outcome:  Pt. Visit Completed   Kemper Durie, RN, MSN, [facility] Care Management Care Management Coordinator [phone]

## 2023-03-09 NOTE — Telephone Encounter (Signed)
Summary: medical notes   Victorino Dike stated she needs the medical notes that supports the need for the light weight wheelchair that she recvd orders for. Notes can be faxed to [phone]  ----- Message from Elon Jester sent at 03/09/2023  9:59 AM EDT ----- Victorino Dike stated she needs the medical notes that supports the need for the light weight wheelchair that she recvd orders for.       No contact made with Victorino Dike from [facility].  Please see above request for a need for medical notes to state need for light weight w/c that has been ordered. Please advise and send notes to fax: #[phone].          Reason for Disposition  NON-URGENT call redirected to PCP's office because it is open  Answer Assessment - Initial Assessment Questions N/A No contact with Victorino Dike from [facility]. Requesting medication notes to state need for light weight w/c that has been ordered  Protocols used: No Contact or Duplicate Contact Call-A-AH

## 2023-03-10 ENCOUNTER — Telehealth: Payer: Self-pay | Admitting: Neurology

## 2023-03-10 DIAGNOSIS — E1122 Type 2 diabetes mellitus with diabetic chronic kidney disease: Secondary | ICD-10-CM | POA: Diagnosis not present

## 2023-03-10 DIAGNOSIS — N1831 Chronic kidney disease, stage 3a: Secondary | ICD-10-CM | POA: Diagnosis not present

## 2023-03-10 DIAGNOSIS — I130 Hypertensive heart and chronic kidney disease with heart failure and stage 1 through stage 4 chronic kidney disease, or unspecified chronic kidney disease: Secondary | ICD-10-CM | POA: Diagnosis not present

## 2023-03-10 DIAGNOSIS — I5033 Acute on chronic diastolic (congestive) heart failure: Secondary | ICD-10-CM | POA: Diagnosis not present

## 2023-03-10 DIAGNOSIS — G35 Multiple sclerosis: Secondary | ICD-10-CM | POA: Diagnosis not present

## 2023-03-10 DIAGNOSIS — J449 Chronic obstructive pulmonary disease, unspecified: Secondary | ICD-10-CM | POA: Diagnosis not present

## 2023-03-10 NOTE — Telephone Encounter (Signed)
Faxed last visit notes to [employer]

## 2023-03-10 NOTE — Telephone Encounter (Signed)
Faxed back to [facility] as requested

## 2023-03-10 NOTE — Addendum Note (Signed)
Addended by: Glean Salvo on: 03/10/2023 11:31 AM   Modules accepted: Orders

## 2023-03-10 NOTE — Telephone Encounter (Signed)
[facility] said they are faxing order for notes re: pt's need of a light weight wheel chair, they received notes re: a scooter, please be on the lookout for fax for notes [id number] for  light weight wheel chair

## 2023-03-10 NOTE — Telephone Encounter (Signed)
noted 

## 2023-03-11 ENCOUNTER — Ambulatory Visit: Payer: Self-pay | Admitting: *Deleted

## 2023-03-11 NOTE — Patient Outreach (Signed)
  Care Coordination   Follow Up Visit Note   03/11/2023 Name: Jeremy [name] MRN: [id number] DOB: 1951-01-30  Jeremy [name] is a 72 y.o. year old male who sees Duanne Limerick, MD for primary care. I spoke with  Jeremy [name] by phone today.  What matters to the patients health and wellness today?  Expresses gratitude for help with getting wheelchair, state this will make getting to MD appointments easier.    Goals Addressed             This Visit's Progress    COMPLETED: Obtain wheelchair for better ability for wife to transport   On track    Care Coordination Interventions: Evaluation of current treatment plan related to MS and difficulty with transporting patient and patient's adherence to plan as established by provider Advised patient to discuss need for wheelchair with PCP Goal met, wheelchair delivered yesterday.      Prevent falls   On track    Recommend to maintain adequate lighting in walkways, remove rugs that may cause slips or trips and continue to use cane for assistance with ambulation.        SDOH assessments and interventions completed:  No     Care Coordination Interventions:  Yes, provided   Interventions Today    Flowsheet Row Most Recent Value  Chronic Disease   Chronic disease during today's visit Other  [MS]  General Interventions   General Interventions Discussed/Reviewed Durable Medical Equipment (DME), Communication with  Durable Medical Equipment (DME) Wheelchair  Wheelchair Standard  Communication with --  [Spoke with [employer] to confirm all notes were received for wheelchair order, notified that equipment was delivered yesterday]        Follow up plan: Follow up call scheduled for 6/19    Encounter Outcome:  Pt. Visit Completed   Kemper Durie, RN, MSN, [facility] Care Management Care Management Coordinator [phone]

## 2023-03-15 ENCOUNTER — Encounter: Payer: Self-pay | Admitting: Oncology

## 2023-03-17 DIAGNOSIS — G35 Multiple sclerosis: Secondary | ICD-10-CM | POA: Diagnosis not present

## 2023-03-17 DIAGNOSIS — J449 Chronic obstructive pulmonary disease, unspecified: Secondary | ICD-10-CM | POA: Diagnosis not present

## 2023-03-17 DIAGNOSIS — I5033 Acute on chronic diastolic (congestive) heart failure: Secondary | ICD-10-CM | POA: Diagnosis not present

## 2023-03-17 DIAGNOSIS — I130 Hypertensive heart and chronic kidney disease with heart failure and stage 1 through stage 4 chronic kidney disease, or unspecified chronic kidney disease: Secondary | ICD-10-CM | POA: Diagnosis not present

## 2023-03-17 DIAGNOSIS — N1831 Chronic kidney disease, stage 3a: Secondary | ICD-10-CM | POA: Diagnosis not present

## 2023-03-17 DIAGNOSIS — E1122 Type 2 diabetes mellitus with diabetic chronic kidney disease: Secondary | ICD-10-CM | POA: Diagnosis not present

## 2023-03-18 ENCOUNTER — Telehealth: Payer: Self-pay

## 2023-03-18 ENCOUNTER — Inpatient Hospital Stay: Payer: Medicare Other

## 2023-03-18 ENCOUNTER — Other Ambulatory Visit: Payer: Self-pay

## 2023-03-18 ENCOUNTER — Emergency Department
Admission: EM | Admit: 2023-03-18 | Discharge: 2023-03-18 | Disposition: A | Discharge status: Home / Self Care | Payer: Medicare Other | Attending: Emergency Medicine | Admitting: Emergency Medicine

## 2023-03-18 VITALS — BP 145/51 | HR 73 | Temp 96.6°F | Resp 92

## 2023-03-18 DIAGNOSIS — J449 Chronic obstructive pulmonary disease, unspecified: Secondary | ICD-10-CM | POA: Insufficient documentation

## 2023-03-18 DIAGNOSIS — I129 Hypertensive chronic kidney disease with stage 1 through stage 4 chronic kidney disease, or unspecified chronic kidney disease: Secondary | ICD-10-CM | POA: Insufficient documentation

## 2023-03-18 DIAGNOSIS — D649 Anemia, unspecified: Secondary | ICD-10-CM | POA: Insufficient documentation

## 2023-03-18 DIAGNOSIS — E1122 Type 2 diabetes mellitus with diabetic chronic kidney disease: Secondary | ICD-10-CM | POA: Insufficient documentation

## 2023-03-18 DIAGNOSIS — E611 Iron deficiency: Secondary | ICD-10-CM | POA: Diagnosis not present

## 2023-03-18 DIAGNOSIS — N1831 Chronic kidney disease, stage 3a: Secondary | ICD-10-CM | POA: Diagnosis not present

## 2023-03-18 DIAGNOSIS — D631 Anemia in chronic kidney disease: Secondary | ICD-10-CM

## 2023-03-18 DIAGNOSIS — D500 Iron deficiency anemia secondary to blood loss (chronic): Secondary | ICD-10-CM

## 2023-03-18 DIAGNOSIS — N189 Chronic kidney disease, unspecified: Secondary | ICD-10-CM | POA: Insufficient documentation

## 2023-03-18 LAB — CBC WITH DIFFERENTIAL (CANCER CENTER ONLY)
Abs Immature Granulocytes: 0.02 10*3/uL (ref 0.00–0.07)
Basophils Absolute: 0.1 10*3/uL (ref 0.0–0.1)
Basophils Relative: 1 %
Eosinophils Absolute: 0.4 10*3/uL (ref 0.0–0.5)
Eosinophils Relative: 6 %
HCT: 22.8 % — ABNORMAL LOW (ref 39.0–52.0)
Hemoglobin: 6.8 g/dL — CL (ref 13.0–17.0)
Immature Granulocytes: 0 %
Lymphocytes Relative: 13 %
Lymphs Abs: 0.7 10*3/uL (ref 0.7–4.0)
MCH: 28 pg (ref 26.0–34.0)
MCHC: 29.8 g/dL — ABNORMAL LOW (ref 30.0–36.0)
MCV: 93.8 fL (ref 80.0–100.0)
Monocytes Absolute: 0.6 10*3/uL (ref 0.1–1.0)
Monocytes Relative: 10 %
Neutro Abs: 3.8 10*3/uL (ref 1.7–7.7)
Neutrophils Relative %: 70 %
Platelet Count: 205 10*3/uL (ref 150–400)
RBC: 2.43 MIL/uL — ABNORMAL LOW (ref 4.22–5.81)
RDW: 17.5 % — ABNORMAL HIGH (ref 11.5–15.5)
WBC Count: 5.5 10*3/uL (ref 4.0–10.5)
nRBC: 0 % (ref 0.0–0.2)

## 2023-03-18 LAB — RETIC PANEL
Immature Retic Fract: 23.7 % — ABNORMAL HIGH (ref 2.3–15.9)
RBC.: 2.41 MIL/uL — ABNORMAL LOW (ref 4.22–5.81)
Retic Count, Absolute: 113 10*3/uL (ref 19.0–186.0)
Retic Ct Pct: 4.7 % — ABNORMAL HIGH (ref 0.4–3.1)
Reticulocyte Hemoglobin: 20.3 pg — ABNORMAL LOW (ref >27.9)

## 2023-03-18 LAB — CBC
HCT: 24.5 % — ABNORMAL LOW (ref 39.0–52.0)
Hemoglobin: 7.2 g/dL — ABNORMAL LOW (ref 13.0–17.0)
MCH: 27.7 pg (ref 26.0–34.0)
MCHC: 29.4 g/dL — ABNORMAL LOW (ref 30.0–36.0)
MCV: 94.2 fL (ref 80.0–100.0)
Platelets: 235 10*3/uL (ref 150–400)
RBC: 2.6 MIL/uL — ABNORMAL LOW (ref 4.22–5.81)
RDW: 17.5 % — ABNORMAL HIGH (ref 11.5–15.5)
WBC: 5.7 10*3/uL (ref 4.0–10.5)
nRBC: 0 % (ref 0.0–0.2)

## 2023-03-18 LAB — COMPREHENSIVE METABOLIC PANEL
ALT: 13 U/L (ref 0–44)
AST: 29 U/L (ref 15–41)
Albumin: 2.8 g/dL — ABNORMAL LOW (ref 3.5–5.0)
Alkaline Phosphatase: 91 U/L (ref 38–126)
Anion gap: 11 (ref 5–15)
BUN: 25 mg/dL — ABNORMAL HIGH (ref 8–23)
CO2: 22 mmol/L (ref 22–32)
Calcium: 8.7 mg/dL — ABNORMAL LOW (ref 8.9–10.3)
Chloride: 107 mmol/L (ref 98–111)
Creatinine, Ser: 1.36 mg/dL — ABNORMAL HIGH (ref 0.61–1.24)
GFR, Estimated: 56 mL/min — ABNORMAL LOW (ref >60)
Glucose, Bld: 103 mg/dL — ABNORMAL HIGH (ref 70–99)
Potassium: 4.1 mmol/L (ref 3.5–5.1)
Sodium: 140 mmol/L (ref 135–145)
Total Bilirubin: 0.8 mg/dL (ref 0.3–1.2)
Total Protein: 7 g/dL (ref 6.5–8.1)

## 2023-03-18 LAB — TYPE AND SCREEN

## 2023-03-18 LAB — PREPARE RBC (CROSSMATCH)

## 2023-03-18 LAB — BPAM RBC: Unit Type and Rh: 6200

## 2023-03-18 MED ORDER — SODIUM CHLORIDE 0.9 % IV SOLN: 10.0000 mL/h | Freq: Once | INTRAVENOUS | Status: DC

## 2023-03-18 MED ORDER — EPOETIN ALFA-EPBX 40000 UNIT/ML IJ SOLN
40000.0000 [IU] | Freq: Once | INTRAMUSCULAR | Status: AC
  Administered 2023-03-18: 40000 [IU] via SUBCUTANEOUS
  Filled 2023-03-18: qty 1

## 2023-03-18 NOTE — Telephone Encounter (Signed)
Received critical lab value: hemoglobin 6.8. Received from [facility] in lab. MD notified and advised for pt to go to ER for blood transfusion. Pt aware of plan. Called Triage and spoke to [place], infomed her that pt will be heading to ER for blood transfusion.

## 2023-03-18 NOTE — Discharge Instructions (Signed)
Call Dr. Servando Snare of gastroenterology to follow schedule up appointment this week.  Call your primary doctor to schedule appointment for recheck of your blood levels this week.  , If you experience any new, worsening, or unexpected symptoms come back to the emergency department immediately for reevaluation.

## 2023-03-18 NOTE — ED Triage Notes (Signed)
Pt to ED sent from cancer center where he gets iron infusions for anemia sent for hgb of 6.8. Pt wears 2L oxygen at baseline. Denies weakness, dizziness. Pt appears pale. Respirations unlabored.  Hx GI bleeding, MS, anemia requiring transfusions. Wife states Hgb was around 9 about 1 month ago. Does not take blood thinners.  IN March pt had "double balloon endoscopy" where 6 places in GI tract were cauterized to stop bldg.

## 2023-03-18 NOTE — ED Provider Notes (Signed)
[facility] Provider Note    Event Date/Time   First MD Initiated Contact with Patient 03/18/23 1526     (approximate)   History   Abnormal Lab   HPI  Jeremy [name] is a 72 y.o. male   Past medical history of iron deficiency anemia, anemia chronic kidney disease, GI bleeding, chronic pain, depression and diabetes, hypertension hyperlipidemia presents admitted department with anemia.  Sent in from his infusion clinic today with a hemoglobin of 6.8 for blood transfusion.  He denies GI bleeding.  He does note that he has been more tired than usual over the last several weeks, exertional dyspnea, little bit more fatigued.  He is not on blood thinners.  He has no other acute medical complaints.  Independent Historian contributed to assessment above: His wife was at bedside corroborates information given above  External Medical Documents Reviewed: retrograde enteroscopy note from [facility] March 2024: Impression: - Preparation of the colon was inadequate. - Patent end-to-side ileo-colonic anastomosis. 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 Treated with argon  plasma coagulation (APC). - One polyp in the mid ileum, removed with a hot  snare. Resected and retrieved. Tattooed.   And endoscopy note from 2/23 w one nonbleeding AVM; no definite source of gi bleeding      Physical Exam   Triage Vital Signs: ED Triage Vitals  Enc Vitals Group     BP 03/18/23 1419 (!) 143/59     Pulse Rate 03/18/23 1419 98     Resp 03/18/23 1419 20     Temp 03/18/23 1419 98.3 F (36.8 C)     Temp Source 03/18/23 1419 Oral     SpO2 03/18/23 1419 (!) 89 %     Weight 03/18/23 1422 198 lb (89.8 kg)     Height 03/18/23 1422 5\' 10"  (1.778 m)     Head Circumference --      Peak Flow --      Pain Score 03/18/23 1422 4     Pain Loc --      Pain Edu? --      Excl. in GC? --     Most recent vital signs: Vitals:   03/18/23 1427 03/18/23 1427  BP:    Pulse:    Resp:    Temp:     SpO2: (!) 89% 92%    General: Awake, no distress.  CV:  Good peripheral perfusion.  Resp:  Normal effort.  Abd:  No distention.  Other:  Awake alert comfortable appearing, mildly pale.  Rectal exam shows brown stool that does have guaiac positive.  No blood or melena.  Soft nontender abdomen.  Awake alert oriented.   ED Results / Procedures / Treatments   Labs (all labs ordered are listed, but only abnormal results are displayed) Labs Reviewed  COMPREHENSIVE METABOLIC PANEL - Abnormal; Notable for the following components:      Result Value   Glucose, Bld 103 (*)    BUN 25 (*)    Creatinine, Ser 1.36 (*)    Calcium 8.7 (*)    Albumin 2.8 (*)    GFR, Estimated 56 (*)    All other components within normal limits  CBC - Abnormal; Notable for the following components:   RBC 2.60 (*)    Hemoglobin 7.2 (*)    HCT 24.5 (*)    MCHC 29.4 (*)    RDW 17.5 (*)    All other components within normal limits  POC OCCULT BLOOD, ED  TYPE AND SCREEN  PREPARE RBC (CROSSMATCH)  TYPE AND SCREEN     I ordered and reviewed the above labs they are notable for his BUN and creatinine not far from his baseline, BUN 25, creatinine of 1.3 with baseline of 20s/30s and 1.2, his hemoglobin here is 7.2 with a hematocrit of 24.5     PROCEDURES:  Critical Care performed: Yes, see critical care procedure note(s)  .Critical Care  Performed by: Pilar Jarvis, MD Authorized by: Pilar Jarvis, MD   Critical care provider statement:    Critical care time (minutes):  30   Critical care was time spent personally by me on the following activities:  Development of treatment plan with patient or surrogate, discussions with consultants, evaluation of patient's response to treatment, examination of patient, ordering and review of laboratory studies, ordering and review of radiographic studies, ordering and performing treatments and interventions, pulse oximetry, re-evaluation of patient's condition and review of  old charts    MEDICATIONS ORDERED IN ED: Medications  0.9 %  sodium chloride infusion (has no administration in time range)     IMPRESSION / MDM / ASSESSMENT AND PLAN / ED COURSE  I reviewed the triage vital signs and the nursing notes.                                Patient's presentation is most consistent with acute presentation with potential threat to life or bodily function.  Differential diagnosis includes, but is not limited to, anemia requiring transfusion, symptomatic anemia, anemia due to CKD or iron deficiency, GI bleeding.   The patient is on the cardiac monitor to evaluate for evidence of arrhythmia and/or significant heart rate changes.  MDM: Is a patient with anemia, well-known due to multiple factors including GI bleeding, CKD, iron deficiency.  6.8 today and feeling symptomatic from it over the last several weeks but normal hemodynamics fortunately.  Will transfuse 1 unit.  Doubt significant GI bleeding with no melena or blood on rectal exam, though he is guaiac positive history of GI bleeding.  He has COPD on baseline 2 L, was documented to be hypoxemic on home oxygen but I turned him down to 2 L and his having oxygen saturations well above 90%, no respiratory distress and clear lungs.  I considered hospitalization for admission or observation for ongoing H&H monitoring, GI consultation, however with no evidence of active GI bleeding and a patient with chronic anemia due to multiple sources, hemodynamically stable and adequate follow-up with GI and PMD and cancer center for infusions, I think that he can be discharged after blood transfusion today.  His hemoglobin runs as low as the sevens in March and fluctuates from sevens to low nines.  Today 7.2, I think a unit of blood transfusion with close follow-up is appropriate and the patient and his wife are in agreement.  They understand to return if any new or worsening symptoms.        FINAL CLINICAL IMPRESSION(S)  / ED DIAGNOSES   Final diagnoses:  Symptomatic anemia     Rx / DC Orders   ED Discharge Orders     None        Note:  This document was prepared using Dragon voice recognition software and may include unintentional dictation errors.    Pilar Jarvis, MD 03/18/23 [phone]

## 2023-03-19 LAB — TYPE AND SCREEN
ABO/RH(D): A POS
Antibody Screen: NEGATIVE
Unit division: 0

## 2023-03-19 LAB — BPAM RBC
Blood Product Expiration Date: 202406202359
ISSUE DATE / TIME: 202405241649

## 2023-03-22 ENCOUNTER — Telehealth: Payer: Self-pay

## 2023-03-22 ENCOUNTER — Encounter: Payer: Self-pay | Admitting: Oncology

## 2023-03-22 NOTE — Telephone Encounter (Signed)
Mychart message sent to pt letting him know of the need for IV venofer.   Pt is here for labs on Day 1, would it be possible for him to get the iron when he gets labs?   Please schedule and inform pt of updated appts:  IV venofer weekly x2

## 2023-03-22 NOTE — Telephone Encounter (Signed)
Spoke to pt and informed him of MD recommendation. He is scheduled for labs tomorrow. He is ok with lab being moved to the afternoon, so he can have venofer same day as the lab, if we are able to accomodate. Please schedule and update pt of appt details  Venofer weekly x2

## 2023-03-22 NOTE — Transitions of Care (Post Inpatient/ED Visit) (Signed)
03/22/2023  Name: Jeremy [name] MRN: [id number] DOB: Sep 18, 1951  Today's TOC FU Call Status: Today's TOC FU Call Status:: Successful TOC FU Call Competed TOC FU Call Complete Date: 03/22/23  Transition Care Management Follow-up Telephone Call Date of Discharge: 03/18/23 Discharge Facility: [facility]) Type of Discharge: Emergency Department Reason for ED Visit: Other: (transferred from cancer center to get blood) How have you been since you were released from the hospital?: Better Any questions or concerns?: No  Items Reviewed: Did you receive and understand the discharge instructions provided?: Yes Medications obtained,verified, and reconciled?: Yes (Medications Reviewed) Any new allergies since your discharge?: No Dietary orders reviewed?: NA Do you have support at home?: Yes People in Home: spouse Name of Support/Comfort Primary Source: Deatra Ina  Medications Reviewed Today: Medications Reviewed Today     Reviewed by Everitt Amber (Medical Assistant) on 03/22/23 at 1531  Med List Status: <None>   Medication Order Taking? Sig Documenting Provider Last Dose Status Informant  acetaminophen (TYLENOL) 500 MG tablet [id number] Yes Take 500 mg by mouth every 6 (six) hours as needed for mild pain. [provider] Taking Active Spouse/Significant Other, Pharmacy Records  albuterol (VENTOLIN HFA) 108 (90 Base) MCG/ACT inhaler [id number] Yes Inhale 2 puffs into the lungs in the morning, at noon, in the evening, and at bedtime. [provider] Taking Active Spouse/Significant Other, Pharmacy Records  B-D INSULIN SYRINGE 1CC/25GX1" 25G X 1" 1 ML MISC [id number] Yes USE AS DIRECTED Duanne Limerick, MD Taking Active Spouse/Significant Other, Pharmacy Records  cyanocobalamin (VITAMIN B12) 500 MCG tablet [id number] Yes Take 1,000 mcg by mouth daily. [provider] Taking Active Spouse/Significant Other, Pharmacy Records  ferrous sulfate 325 (65  FE) MG tablet [id number] No Take 325 mg by mouth daily with breakfast.  Patient not taking: Reported on 03/22/2023   [provider] Not Taking Active Spouse/Significant Other, Pharmacy Records  furosemide (LASIX) 40 MG tablet [id number] Yes Take 40 mg by mouth. 1/2 pill every other day- Singh [provider] Taking Active   gemfibrozil (LOPID) 600 MG tablet [id number] Yes Take 1 tablet (600 mg total) by mouth 2 (two) times daily before a meal. TAKE 1 TABLET BY MOUTH TWICE  DAILY BEFORE MEALS Duanne Limerick, MD Taking Active   insulin glargine (LANTUS) 100 UNIT/ML Solostar Pen [id number] Yes Inject 20 Units into the skin at bedtime. Darlin Priestly, MD Taking Active Spouse/Significant Other, Pharmacy Records           Med Note Harlow Asa Nov 24, 2022  5:04 PM)    Interferon Beta-1b (BETASERON) 0.3 MG KIT injection [id number] Yes Inject subcutaneously 1 syringe every other day. Levert Feinstein, MD Taking Active Spouse/Significant Other, Pharmacy Records           Med Note Harlow Asa Nov 24, 2022  5:02 PM)    loratadine (CLARITIN) 10 MG tablet [id number] Yes Take 1 tablet (10 mg total) by mouth daily. Duanne Limerick, MD Taking Active   metFORMIN (GLUCOPHAGE) 500 MG tablet [id number] Yes Take 500 mg by mouth 2 (two) times daily. [provider] Taking Active Spouse/Significant Other, Pharmacy Records  mupirocin ointment (BACTROBAN) 2 % [id number] Yes Apply 1 Application topically 2 (two) times daily. Apply to effected area bid Duanne Limerick, MD Taking Active Spouse/Significant Other, Pharmacy Records  omeprazole (PRILOSEC) 40 MG capsule [id number] Yes Take 1 capsule (40 mg total) by mouth daily. Duanne Limerick, MD Taking  Active   pregabalin (LYRICA) 200 MG capsule [id number] Yes Take 1 capsule (200 mg total) by mouth 2 (two) times daily. Edward Jolly, MD Taking Active   sertraline (ZOLOFT) 50 MG tablet [id number] Yes TAKE ONE-HALF TABLET BY MOUTH  DAILY Duanne Limerick, MD Taking Active   simvastatin (ZOCOR) 40 MG tablet [id number] Yes Take 1 tablet (40 mg total) by mouth daily. Duanne Limerick, MD Taking Active   sodium bicarbonate 650 MG tablet [id number] Yes Take 650 mg by mouth 2 (two) times daily. [provider] Taking Active Spouse/Significant Other, Pharmacy Records  traMADol (ULTRAM) 50 MG tablet [id number] Yes Take 1 tablet (50 mg total) by mouth every 6 (six) hours as needed. Edward Jolly, MD Taking Active   Med List Note Nonah Mattes, RN 12/30/22 1005): UDS 12/16/22 MR 04/07/23 Pain contracts signed 12/30/22            Home Care and Equipment/Supplies: Were Home Health Services Ordered?: NA Any new equipment or medical supplies ordered?: No  Functional Questionnaire: Do you need assistance with bathing/showering or dressing?: No Do you need assistance with meal preparation?: Yes Do you need assistance with eating?: No Do you have difficulty maintaining continence: No Do you need assistance with getting out of bed/getting out of a chair/moving?: No Do you have difficulty managing or taking your medications?: No  Follow up appointments reviewed: PCP Follow-up appointment confirmed?: NA (has f/up from infusion with Dr Cathie Hoops and [facility]) [facility] Follow-up appointment confirmed?: Yes Date of Specialist follow-up appointment?: 03/23/23 Follow-Up Specialty Provider:: infusion with Dr Cathie Hoops Do you need transportation to your follow-up appointment?: No Do you understand care options if your condition(s) worsen?: Yes-patient verbalized understanding    SIGNATURE Arthur Holms

## 2023-03-22 NOTE — Telephone Encounter (Signed)
-----   Message from Rickard Patience, MD sent at 03/18/2023  4:49 PM EDT ----- Please also schedule him to get Venofer weekly x 2 thanks.

## 2023-03-23 ENCOUNTER — Other Ambulatory Visit: Payer: Self-pay | Admitting: Oncology

## 2023-03-23 ENCOUNTER — Inpatient Hospital Stay: Payer: Medicare Other

## 2023-03-23 ENCOUNTER — Other Ambulatory Visit: Payer: Self-pay

## 2023-03-23 VITALS — BP 127/49 | HR 72 | Temp 98.8°F | Resp 19

## 2023-03-23 DIAGNOSIS — D500 Iron deficiency anemia secondary to blood loss (chronic): Secondary | ICD-10-CM

## 2023-03-23 DIAGNOSIS — D631 Anemia in chronic kidney disease: Secondary | ICD-10-CM

## 2023-03-23 DIAGNOSIS — E611 Iron deficiency: Secondary | ICD-10-CM | POA: Diagnosis not present

## 2023-03-23 DIAGNOSIS — N1831 Chronic kidney disease, stage 3a: Secondary | ICD-10-CM | POA: Diagnosis not present

## 2023-03-23 LAB — CBC WITH DIFFERENTIAL (CANCER CENTER ONLY)
Abs Immature Granulocytes: 0.02 10*3/uL (ref 0.00–0.07)
Basophils Absolute: 0 10*3/uL (ref 0.0–0.1)
Basophils Relative: 1 %
Eosinophils Absolute: 0.1 10*3/uL (ref 0.0–0.5)
Eosinophils Relative: 1 %
HCT: 24.3 % — ABNORMAL LOW (ref 39.0–52.0)
Hemoglobin: 7.2 g/dL — ABNORMAL LOW (ref 13.0–17.0)
Immature Granulocytes: 0 %
Lymphocytes Relative: 16 %
Lymphs Abs: 0.8 10*3/uL (ref 0.7–4.0)
MCH: 27.3 pg (ref 26.0–34.0)
MCHC: 29.6 g/dL — ABNORMAL LOW (ref 30.0–36.0)
MCV: 92 fL (ref 80.0–100.0)
Monocytes Absolute: 0.6 10*3/uL (ref 0.1–1.0)
Monocytes Relative: 11 %
Neutro Abs: 3.8 10*3/uL (ref 1.7–7.7)
Neutrophils Relative %: 71 %
Platelet Count: 168 10*3/uL (ref 150–400)
RBC: 2.64 MIL/uL — ABNORMAL LOW (ref 4.22–5.81)
RDW: 17.7 % — ABNORMAL HIGH (ref 11.5–15.5)
WBC Count: 5.3 10*3/uL (ref 4.0–10.5)
nRBC: 0 % (ref 0.0–0.2)

## 2023-03-23 LAB — TYPE AND SCREEN
ABO/RH(D): A POS
Antibody Screen: NEGATIVE

## 2023-03-23 LAB — BPAM RBC

## 2023-03-23 LAB — PREPARE RBC (CROSSMATCH)

## 2023-03-23 MED ORDER — SODIUM CHLORIDE 0.9 % IV SOLN
200.0000 mg | Freq: Once | INTRAVENOUS | Status: AC
  Administered 2023-03-23: 200 mg via INTRAVENOUS
  Filled 2023-03-23: qty 200

## 2023-03-23 MED ORDER — SODIUM CHLORIDE 0.9 % IV SOLN
Freq: Once | INTRAVENOUS | Status: AC
  Filled 2023-03-23: qty 250

## 2023-03-24 ENCOUNTER — Inpatient Hospital Stay: Payer: Medicare Other

## 2023-03-24 DIAGNOSIS — E611 Iron deficiency: Secondary | ICD-10-CM | POA: Diagnosis not present

## 2023-03-24 DIAGNOSIS — N1831 Chronic kidney disease, stage 3a: Secondary | ICD-10-CM | POA: Diagnosis not present

## 2023-03-24 DIAGNOSIS — D500 Iron deficiency anemia secondary to blood loss (chronic): Secondary | ICD-10-CM

## 2023-03-24 DIAGNOSIS — D631 Anemia in chronic kidney disease: Secondary | ICD-10-CM | POA: Diagnosis not present

## 2023-03-24 LAB — TYPE AND SCREEN: Unit division: 0

## 2023-03-24 LAB — BPAM RBC: Blood Product Expiration Date: 202406202359

## 2023-03-24 MED ORDER — DIPHENHYDRAMINE HCL 25 MG PO CAPS
25.0000 mg | ORAL_CAPSULE | Freq: Once | ORAL | Status: AC
  Administered 2023-03-24: 25 mg via ORAL
  Filled 2023-03-24: qty 1

## 2023-03-24 MED ORDER — ACETAMINOPHEN 325 MG PO TABS
650.0000 mg | ORAL_TABLET | Freq: Once | ORAL | Status: AC
  Administered 2023-03-24: 650 mg via ORAL
  Filled 2023-03-24: qty 2

## 2023-03-24 MED ORDER — SODIUM CHLORIDE 0.9% IV SOLUTION
250.0000 mL | Freq: Once | INTRAVENOUS | Status: AC
  Administered 2023-03-24: 250 mL via INTRAVENOUS
  Filled 2023-03-24: qty 250

## 2023-03-25 DIAGNOSIS — E1122 Type 2 diabetes mellitus with diabetic chronic kidney disease: Secondary | ICD-10-CM | POA: Diagnosis not present

## 2023-03-25 DIAGNOSIS — I5033 Acute on chronic diastolic (congestive) heart failure: Secondary | ICD-10-CM | POA: Diagnosis not present

## 2023-03-25 DIAGNOSIS — I130 Hypertensive heart and chronic kidney disease with heart failure and stage 1 through stage 4 chronic kidney disease, or unspecified chronic kidney disease: Secondary | ICD-10-CM | POA: Diagnosis not present

## 2023-03-25 DIAGNOSIS — N1831 Chronic kidney disease, stage 3a: Secondary | ICD-10-CM | POA: Diagnosis not present

## 2023-03-25 DIAGNOSIS — G35 Multiple sclerosis: Secondary | ICD-10-CM | POA: Diagnosis not present

## 2023-03-25 DIAGNOSIS — J449 Chronic obstructive pulmonary disease, unspecified: Secondary | ICD-10-CM | POA: Diagnosis not present

## 2023-03-25 LAB — BPAM RBC: Unit Type and Rh: 6200

## 2023-03-25 LAB — TYPE AND SCREEN

## 2023-03-28 ENCOUNTER — Telehealth: Payer: Self-pay | Admitting: Family Medicine

## 2023-03-28 DIAGNOSIS — I130 Hypertensive heart and chronic kidney disease with heart failure and stage 1 through stage 4 chronic kidney disease, or unspecified chronic kidney disease: Secondary | ICD-10-CM | POA: Diagnosis not present

## 2023-03-28 DIAGNOSIS — Z794 Long term (current) use of insulin: Secondary | ICD-10-CM | POA: Diagnosis not present

## 2023-03-28 DIAGNOSIS — N1831 Chronic kidney disease, stage 3a: Secondary | ICD-10-CM | POA: Diagnosis not present

## 2023-03-28 DIAGNOSIS — Q2730 Arteriovenous malformation, site unspecified: Secondary | ICD-10-CM | POA: Diagnosis not present

## 2023-03-28 DIAGNOSIS — E1122 Type 2 diabetes mellitus with diabetic chronic kidney disease: Secondary | ICD-10-CM | POA: Diagnosis not present

## 2023-03-28 DIAGNOSIS — D509 Iron deficiency anemia, unspecified: Secondary | ICD-10-CM | POA: Diagnosis not present

## 2023-03-28 DIAGNOSIS — E785 Hyperlipidemia, unspecified: Secondary | ICD-10-CM | POA: Diagnosis not present

## 2023-03-28 DIAGNOSIS — D689 Coagulation defect, unspecified: Secondary | ICD-10-CM | POA: Diagnosis not present

## 2023-03-28 DIAGNOSIS — E1140 Type 2 diabetes mellitus with diabetic neuropathy, unspecified: Secondary | ICD-10-CM | POA: Diagnosis not present

## 2023-03-28 DIAGNOSIS — D631 Anemia in chronic kidney disease: Secondary | ICD-10-CM | POA: Diagnosis not present

## 2023-03-28 DIAGNOSIS — Z7984 Long term (current) use of oral hypoglycemic drugs: Secondary | ICD-10-CM | POA: Diagnosis not present

## 2023-03-28 DIAGNOSIS — I272 Pulmonary hypertension, unspecified: Secondary | ICD-10-CM | POA: Diagnosis not present

## 2023-03-28 DIAGNOSIS — K7469 Other cirrhosis of liver: Secondary | ICD-10-CM | POA: Diagnosis not present

## 2023-03-28 DIAGNOSIS — I700 Atherosclerosis of aorta: Secondary | ICD-10-CM | POA: Diagnosis not present

## 2023-03-28 DIAGNOSIS — F32A Depression, unspecified: Secondary | ICD-10-CM | POA: Diagnosis not present

## 2023-03-28 DIAGNOSIS — R911 Solitary pulmonary nodule: Secondary | ICD-10-CM | POA: Diagnosis not present

## 2023-03-28 DIAGNOSIS — G35 Multiple sclerosis: Secondary | ICD-10-CM | POA: Diagnosis not present

## 2023-03-28 DIAGNOSIS — Z9981 Dependence on supplemental oxygen: Secondary | ICD-10-CM | POA: Diagnosis not present

## 2023-03-28 DIAGNOSIS — I5033 Acute on chronic diastolic (congestive) heart failure: Secondary | ICD-10-CM | POA: Diagnosis not present

## 2023-03-28 DIAGNOSIS — G894 Chronic pain syndrome: Secondary | ICD-10-CM | POA: Diagnosis not present

## 2023-03-28 DIAGNOSIS — J9611 Chronic respiratory failure with hypoxia: Secondary | ICD-10-CM | POA: Diagnosis not present

## 2023-03-28 DIAGNOSIS — K219 Gastro-esophageal reflux disease without esophagitis: Secondary | ICD-10-CM | POA: Diagnosis not present

## 2023-03-28 DIAGNOSIS — Z87891 Personal history of nicotine dependence: Secondary | ICD-10-CM | POA: Diagnosis not present

## 2023-03-28 DIAGNOSIS — Z9181 History of falling: Secondary | ICD-10-CM | POA: Diagnosis not present

## 2023-03-28 DIAGNOSIS — J449 Chronic obstructive pulmonary disease, unspecified: Secondary | ICD-10-CM | POA: Diagnosis not present

## 2023-03-28 NOTE — Telephone Encounter (Signed)
Copied from CRM [id number]. Topic: General - Other >> Mar 28, 2023  3:22 PM Santiya F wrote: Reason for CRM: Home Health Verbal Orders - Caller/Agency: [facility]  Callback Number: [phone] Requesting OT/PT/Skilled Nursing/Social Work/Speech Therapy: Physical Therapy  Frequency: 1 time a week for 9 weeks

## 2023-03-29 ENCOUNTER — Ambulatory Visit
Payer: Medicare Other | Attending: Student in an Organized Health Care Education/Training Program | Admitting: Student in an Organized Health Care Education/Training Program

## 2023-03-29 ENCOUNTER — Encounter: Payer: Self-pay | Admitting: Student in an Organized Health Care Education/Training Program

## 2023-03-29 ENCOUNTER — Inpatient Hospital Stay: Payer: Medicare Other | Attending: Oncology

## 2023-03-29 ENCOUNTER — Telehealth: Payer: Self-pay

## 2023-03-29 VITALS — BP 139/57 | HR 82 | Temp 98.2°F | Ht 70.0 in | Wt 198.0 lb

## 2023-03-29 DIAGNOSIS — Z79899 Other long term (current) drug therapy: Secondary | ICD-10-CM | POA: Insufficient documentation

## 2023-03-29 DIAGNOSIS — Z87891 Personal history of nicotine dependence: Secondary | ICD-10-CM | POA: Insufficient documentation

## 2023-03-29 DIAGNOSIS — G35 Multiple sclerosis: Secondary | ICD-10-CM | POA: Diagnosis not present

## 2023-03-29 DIAGNOSIS — D631 Anemia in chronic kidney disease: Secondary | ICD-10-CM | POA: Diagnosis not present

## 2023-03-29 DIAGNOSIS — G894 Chronic pain syndrome: Secondary | ICD-10-CM | POA: Insufficient documentation

## 2023-03-29 DIAGNOSIS — N1831 Chronic kidney disease, stage 3a: Secondary | ICD-10-CM

## 2023-03-29 DIAGNOSIS — E1142 Type 2 diabetes mellitus with diabetic polyneuropathy: Secondary | ICD-10-CM | POA: Diagnosis not present

## 2023-03-29 DIAGNOSIS — G609 Hereditary and idiopathic neuropathy, unspecified: Secondary | ICD-10-CM | POA: Diagnosis not present

## 2023-03-29 DIAGNOSIS — E1140 Type 2 diabetes mellitus with diabetic neuropathy, unspecified: Secondary | ICD-10-CM | POA: Insufficient documentation

## 2023-03-29 DIAGNOSIS — E611 Iron deficiency: Secondary | ICD-10-CM | POA: Insufficient documentation

## 2023-03-29 DIAGNOSIS — Z794 Long term (current) use of insulin: Secondary | ICD-10-CM | POA: Insufficient documentation

## 2023-03-29 LAB — CBC WITH DIFFERENTIAL (CANCER CENTER ONLY)
Abs Immature Granulocytes: 0.03 10*3/uL (ref 0.00–0.07)
Basophils Absolute: 0 10*3/uL (ref 0.0–0.1)
Basophils Relative: 1 %
Eosinophils Absolute: 0.3 10*3/uL (ref 0.0–0.5)
Eosinophils Relative: 5 %
HCT: 28.8 % — ABNORMAL LOW (ref 39.0–52.0)
Hemoglobin: 8.9 g/dL — ABNORMAL LOW (ref 13.0–17.0)
Immature Granulocytes: 1 %
Lymphocytes Relative: 9 %
Lymphs Abs: 0.5 10*3/uL — ABNORMAL LOW (ref 0.7–4.0)
MCH: 28.3 pg (ref 26.0–34.0)
MCHC: 30.9 g/dL (ref 30.0–36.0)
MCV: 91.7 fL (ref 80.0–100.0)
Monocytes Absolute: 0.5 10*3/uL (ref 0.1–1.0)
Monocytes Relative: 8 %
Neutro Abs: 4.8 10*3/uL (ref 1.7–7.7)
Neutrophils Relative %: 76 %
Platelet Count: 182 10*3/uL (ref 150–400)
RBC: 3.14 MIL/uL — ABNORMAL LOW (ref 4.22–5.81)
RDW: 18.5 % — ABNORMAL HIGH (ref 11.5–15.5)
WBC Count: 6.1 10*3/uL (ref 4.0–10.5)
nRBC: 0 % (ref 0.0–0.2)

## 2023-03-29 LAB — SAMPLE TO BLOOD BANK

## 2023-03-29 MED ORDER — TRAMADOL HCL 50 MG PO TABS
50.0000 mg | ORAL_TABLET | Freq: Four times a day (QID) | ORAL | 2 refills | Status: AC | PRN
Start: 2023-04-08 — End: 2023-07-07

## 2023-03-29 MED FILL — Iron Sucrose Inj 20 MG/ML (Fe Equiv): INTRAVENOUS | Qty: 10 | Status: AC

## 2023-03-29 NOTE — Progress Notes (Signed)
PROVIDER NOTE: Information contained herein reflects review and annotations entered in association with encounter. Interpretation of such information and data should be left to medically-trained personnel. Information provided to patient can be located elsewhere in the medical record under "Patient Instructions". Document created using STT-dictation technology, any transcriptional errors that may result from process are unintentional.    Patient: Jeremy [name]  Service Category: E/M  Provider: Edward Jolly, MD  DOB: [birth year]  DOS: 03/29/2023  Referring Provider: Duanne Limerick, MD  MRN: [id number]  Specialty: Interventional Pain Management  PCP: Duanne Limerick, MD  Type: Established Patient  Setting: Ambulatory outpatient    Location: Office  Delivery: Face-to-face     HPI  [name], a 72 y.o. year old male, is here today because of his Chronic pain syndrome [G89.4]. Mr. Suhre primary complain today is Foot Pain (both)  Pain Assessment: Severity of Chronic pain is reported as a 5 /10. Location: Foot Left, Right/Denies. Onset: More than a month ago. Quality: Burning, Aching, Constant, Tingling, Numbness. Timing: Constant. Modifying factor(s): Meds and sitting down. Vitals:  height is 5\' 10"  (1.778 m) and weight is 198 lb (89.8 kg). His temperature is 98.2 F (36.8 C). His blood pressure is 139/57 (abnormal) and his pulse is 82. His oxygen saturation is 86% (abnormal).  BMI: Estimated body mass index is 28.41 kg/m as calculated from the following:   Height as of this encounter: 5\' 10"  (1.778 m).   Weight as of this encounter: 198 lb (89.8 kg). Last encounter: 12/16/2022. Last procedure: Visit date not found.  Reason for encounter:   No change in medical history since last visit.  Patient's pain is at baseline.  Patient continues multimodal pain regimen as prescribed.  States that it provides pain relief and improvement in functional status.   HPI from initial clinic visit: [name]  is a pleasant 72 year old male who presents with generalized weakness, deconditioning and chronic pain. He has a history of multiple sclerosis. He also has a history of insulin-dependent diabetes. He has gait difficulties. He also has diabetic peripheral neuropathy. He ambulates with a rolling walker. He has a wheelchair when he goes out and is present in a wheelchair today. He is on Betaseron for MS management. He is on continuous nasal oxygen. He gets iron infusions. History of GI bleeds as well. He is being referred from his neurologist for management of his tramadol and Lyrica. He was a former smoker but no longer smokes.   Pharmacotherapy Assessment  Analgesic: Tramadol 50 mg q6 hrs prn  Monitoring: [place] PMP: PDMP reviewed during this encounter.       Pharmacotherapy: No side-effects or adverse reactions reported. Compliance: No problems identified. Effectiveness: Clinically acceptable.  Brigitte Pulse, RN  03/29/2023 10:54 AM  Sign when Signing Visit Nursing Pain Medication Assessment:  Safety precautions to be maintained throughout the outpatient stay will include: orient to surroundings, keep bed in low position, maintain call bell within reach at all times, provide assistance with transfer out of bed and ambulation.  Medication Inspection Compliance: Pill count conducted under aseptic conditions, in front of the patient. Neither the pills nor the bottle was removed from the patient's sight at any time. Once count was completed pills were immediately returned to the patient in their original bottle.  Medication: Tramadol (Ultram) Pill/Patch Count:  47 of 120 pills remain Pill/Patch Appearance: Markings consistent with prescribed medication Bottle Appearance: Standard pharmacy container. Clearly labeled. Filled Date: 5 / 15 / 2024 Last  Medication intake:  TodaySafety precautions to be maintained throughout the outpatient stay will include: orient to surroundings, keep bed in low position,  maintain call bell within reach at all times, provide assistance with transfer out of bed and ambulation.      No results found for: "CBDTHCR" No results found for: "D8THCCBX" No results found for: "D9THCCBX"  UDS:  Summary  Date Value Ref Range Status  12/16/2022 Note  Final    Comment:    ==================================================================== Compliance Drug Analysis, Ur ==================================================================== Test                             Result       Flag       Units  Drug Present and Declared for Prescription Verification   Tramadol                       >5155        EXPECTED   ng/mg creat   O-Desmethyltramadol            >5155        EXPECTED   ng/mg creat   N-Desmethyltramadol            1223         EXPECTED   ng/mg creat    Source of tramadol is a prescription medication. O-desmethyltramadol    and N-desmethyltramadol are expected metabolites of tramadol.    Pregabalin                     PRESENT      EXPECTED   Sertraline                     PRESENT      EXPECTED   Desmethylsertraline            PRESENT      EXPECTED    Desmethylsertraline is an expected metabolite of sertraline.    Acetaminophen                  PRESENT      EXPECTED ==================================================================== Test                      Result    Flag   Units      Ref Range   Creatinine              97               mg/dL      >=16 ==================================================================== Declared Medications:  The flagging and interpretation on this report are based on the  following declared medications.  Unexpected results may arise from  inaccuracies in the declared medications.   **Note: The testing scope of this panel includes these medications:   Pregabalin (Lyrica)  Sertraline (Zoloft)  Tramadol (Ultram)   **Note: The testing scope of this panel does not include small to  moderate amounts of these reported  medications:   Acetaminophen   **Note: The testing scope of this panel does not include the  following reported medications:   Albuterol  Furosemide (Lasix)  Gemfibrozil (Lopid)  Insulin (Lantus)  Interferon beta-1b (Betaseron)  Iron  Loratadine (Claritin)  Metformin (Glucophage)  Mupirocin (Bactroban)  Omeprazole (Prilosec)  Sodium Bicarbonate  Vitamin B12 ==================================================================== For clinical consultation, please call [phone]. ====================================================================       ROS  Constitutional: Denies  any fever or chills Gastrointestinal: No reported hemesis, hematochezia, vomiting, or acute GI distress Musculoskeletal:  low back pain Neurological:  b/l feet parasthesias  Medication Review  Insulin Syringe-Needle U-100, Interferon Beta-1b, acetaminophen, albuterol, cyanocobalamin, ferrous sulfate, furosemide, gemfibrozil, insulin glargine, loratadine, metFORMIN, mupirocin ointment, omeprazole, pregabalin, sertraline, simvastatin, sodium bicarbonate, and traMADol  History Review  Allergy: Mr. Landman is allergic to betadine [povidone iodine]. Drug: Mr. Stachurski  reports no history of drug use. Alcohol:  reports that he does not currently use alcohol. Tobacco:  reports that he quit smoking about 17 years ago. His smoking use included cigarettes. He has a 45.00 pack-year smoking history. He has never used smokeless tobacco. Social: Mr. Collett  reports that he quit smoking about 17 years ago. His smoking use included cigarettes. He has a 45.00 pack-year smoking history. He has never used smokeless tobacco. He reports that he does not currently use alcohol. He reports that he does not use drugs. Medical:  has a past medical history of Chronic pain, Depression, Diabetes (HCC), GERD (gastroesophageal reflux disease), GI bleed (12/2022), Hyperlipemia, Hypertension, and MS (multiple sclerosis) (HCC). Surgical:  Mr. Sibbett  has a past surgical history that includes Colectomy (06-2008); Colonoscopy (2015); Colonoscopy with propofol (N/A, 09/10/2021); Esophagogastroduodenoscopy (egd) with propofol (N/A, 09/10/2021); and Givens capsule study (N/A, 09/25/2021). Family: family history includes COPD in his brother; Diabetes in his brother; Esophageal cancer in his nephew; Heart attack in his brother and father; Lung cancer in his mother.  Laboratory Chemistry Profile   Renal Lab Results  Component Value Date   BUN 25 (H) 03/18/2023   CREATININE 1.36 (H) 03/18/2023   BCR 23 02/15/2022   GFRAA 61 01/15/2020   GFRNONAA 56 (L) 03/18/2023    Hepatic Lab Results  Component Value Date   AST 29 03/18/2023   ALT 13 03/18/2023   ALBUMIN 2.8 (L) 03/18/2023   ALKPHOS 91 03/18/2023   AMMONIA 47 (H) 08/18/2022    Electrolytes Lab Results  Component Value Date   NA 140 03/18/2023   K 4.1 03/18/2023   CL 107 03/18/2023   CALCIUM 8.7 (L) 03/18/2023   PHOS 2.8 08/23/2022    Bone No results found for: "VD25OH", "VD125OH2TOT", "WU9811BJ4", "NW2956OZ3", "25OHVITD1", "25OHVITD2", "25OHVITD3", "TESTOFREE", "TESTOSTERONE"  Inflammation (CRP: Acute Phase) (ESR: Chronic Phase) Lab Results  Component Value Date   LATICACIDVEN 1.1 08/18/2022         Note: Above Lab results reviewed.  Recent Imaging Review  DG Chest 1 View CLINICAL DATA:  Congestive heart failure.  EXAM: CHEST  1 VIEW  COMPARISON:  November 16, 2022.  FINDINGS: Stable cardiomediastinal silhouette. Mild central pulmonary vascular congestion is noted. Right upper lobe nodular density is again noted. New left midlung opacity is noted most consistent with pneumonia given how quickly it is developed. Bony thorax is unremarkable.  IMPRESSION: Mild central pulmonary vascular congestion. New left midlung opacity is noted most consistent with pneumonia given how quickly it has developed.  Electronically Signed   By: Lupita Raider M.D.    On: 11/24/2022 16:30 Note: Reviewed        Physical Exam  General appearance: Well nourished, well developed, and well hydrated. In no apparent acute distress Mental status: Alert, oriented x 3 (person, place, & time)       Respiratory: No evidence of acute respiratory distress Eyes: PERLA Vitals: BP (!) 139/57   Pulse 82   Temp 98.2 F (36.8 C)   Ht 5\' 10"  (1.778 m)   Wt 198  lb (89.8 kg)   SpO2 (!) 86% Comment: 2 liters  BMI 28.41 kg/m  BMI: Estimated body mass index is 28.41 kg/m as calculated from the following:   Height as of this encounter: 5\' 10"  (1.778 m).   Weight as of this encounter: 198 lb (89.8 kg). Ideal: Ideal body weight: 73 kg (160 lb 15 oz) Adjusted ideal body weight: 79.7 kg (175 lb 12.2 oz)  Patient in wheelchair, fairly deconditioned Limited lumbar spinal flexion and extension, tenderness to palpation along lower lumbar spine Mild edema of bilateral lower extremity Neuropathy of bilateral legs   4 out of 5 strength bilateral lower extremity: Plantar flexion, dorsiflexion, knee flexion, knee extension.  Assessment   Diagnosis Status  1. Chronic pain syndrome   2. Stage 3a chronic kidney disease (HCC)   3. Chronic painful diabetic neuropathy (HCC)   4. Type 2 diabetes mellitus with diabetic polyneuropathy, with long-term current use of insulin (HCC)   5. Multiple sclerosis (HCC)   6. Idiopathic peripheral neuropathy    Controlled Controlled Controlled   Updated Problems: No problems updated.  Plan of Care  Problem-specific:  No problem-specific Assessment & Plan notes found for this encounter.  Mr. Ajit Yohey has a current medication list which includes the following long-term medication(s): gemfibrozil, insulin glargine, betaseron, loratadine, omeprazole, pregabalin, sertraline, and simvastatin.  Pharmacotherapy (Medications Ordered): Meds ordered this encounter  Medications   traMADol (ULTRAM) 50 MG tablet    Sig: Take 1 tablet (50 mg  total) by mouth every 6 (six) hours as needed.    Dispense:  120 tablet    Refill:  2   Orders:  No orders of the defined types were placed in this encounter.   Analgesic Pharmacotherapy:  Opioid Analgesics: Tramadol 50 mg every 6 hours as needed, quantity 120/month.  Patient has been seeing neurology in [facility] for the last 20 years however new nurse practitioner does not want to continue managing his tramadol, no previous compliance issues, patient is low risk.  Membrane stabilizer:   Lyrica 200 mg twice a day that PCP no longer wants to manage  Muscle relaxant: Not indicated  NSAID: Medically contraindicated history of GI bleed  Other analgesic(s): To be determined at a later time    Interventional management options: Mr. Pappa was informed that there is no guarantee that he would be a candidate for interventional therapies. The decision will be based on the results of diagnostic studies, as well as Mr. Silliman's risk profile.  Procedure(s) under consideration:  Discussed Qutenza Lumbar facet medial branch nerve blocks     Follow-up plan:   Return in about 3 months (around 07/07/2023) for Medication Management, in person.      Recent Visits Date Type Provider Dept  12/30/22 Office Visit Edward Jolly, MD [facility]  Showing recent visits within past 90 days and meeting all other requirements Today's Visits Date Type Provider Dept  03/29/23 Office Visit Edward Jolly, MD [facility]  Showing today's visits and meeting all other requirements Future Appointments No visits were found meeting these conditions. Showing future appointments within next 90 days and meeting all other requirements  I discussed the assessment and treatment plan with the patient. The patient was provided an opportunity to ask questions and all were answered. The patient agreed with the plan and demonstrated an understanding of the instructions.  Patient advised to call back or seek  an in-person evaluation if the symptoms or condition worsens.  Duration of encounter: 30 minutes.  Total time on encounter, as per AMA guidelines included both the face-to-face and non-face-to-face time personally spent by the physician and/or other qualified health care professional(s) on the day of the encounter (includes time in activities that require the physician or other qualified health care professional and does not include time in activities normally performed by clinical staff). Physician's time may include the following activities when performed: Preparing to see the patient (e.g., pre-charting review of records, searching for previously ordered imaging, lab work, and nerve conduction tests) Review of prior analgesic pharmacotherapies. Reviewing PMP Interpreting ordered tests (e.g., lab work, imaging, nerve conduction tests) Performing post-procedure evaluations, including interpretation of diagnostic procedures Obtaining and/or reviewing separately obtained history Performing a medically appropriate examination and/or evaluation Counseling and educating the patient/family/caregiver Ordering medications, tests, or procedures Referring and communicating with other health care professionals (when not separately reported) Documenting clinical information in the electronic or other health record Independently interpreting results (not separately reported) and communicating results to the patient/ family/caregiver Care coordination (not separately reported)  Note by: Edward Jolly, MD Date: 03/29/2023; Time: 11:55 AM

## 2023-03-29 NOTE — Progress Notes (Signed)
Nursing Pain Medication Assessment:  Safety precautions to be maintained throughout the outpatient stay will include: orient to surroundings, keep bed in low position, maintain call bell within reach at all times, provide assistance with transfer out of bed and ambulation.  Medication Inspection Compliance: Pill count conducted under aseptic conditions, in front of the patient. Neither the pills nor the bottle was removed from the patient's sight at any time. Once count was completed pills were immediately returned to the patient in their original bottle.  Medication: Tramadol (Ultram) Pill/Patch Count:  47 of 120 pills remain Pill/Patch Appearance: Markings consistent with prescribed medication Bottle Appearance: Standard pharmacy container. Clearly labeled. Filled Date: 5 / 15 / 2024 Last Medication intake:  TodaySafety precautions to be maintained throughout the outpatient stay will include: orient to surroundings, keep bed in low position, maintain call bell within reach at all times, provide assistance with transfer out of bed and ambulation.

## 2023-03-29 NOTE — Telephone Encounter (Signed)
Returned call to Arna Medici [facility] [phone]-  okay 1 time week x 9 weeks. Also, left on vm that we would be out of office until Monday, so no papers will be signed until next week

## 2023-03-30 ENCOUNTER — Ambulatory Visit: Payer: Medicare Other

## 2023-03-30 ENCOUNTER — Inpatient Hospital Stay: Payer: Medicare Other

## 2023-03-31 ENCOUNTER — Inpatient Hospital Stay: Payer: Medicare Other

## 2023-03-31 VITALS — BP 122/52 | HR 76 | Temp 98.6°F | Resp 18

## 2023-03-31 DIAGNOSIS — Z79899 Other long term (current) drug therapy: Secondary | ICD-10-CM | POA: Diagnosis not present

## 2023-03-31 DIAGNOSIS — E611 Iron deficiency: Secondary | ICD-10-CM | POA: Diagnosis not present

## 2023-03-31 DIAGNOSIS — D500 Iron deficiency anemia secondary to blood loss (chronic): Secondary | ICD-10-CM

## 2023-03-31 DIAGNOSIS — D631 Anemia in chronic kidney disease: Secondary | ICD-10-CM

## 2023-03-31 DIAGNOSIS — N1831 Chronic kidney disease, stage 3a: Secondary | ICD-10-CM | POA: Diagnosis not present

## 2023-03-31 DIAGNOSIS — Z87891 Personal history of nicotine dependence: Secondary | ICD-10-CM | POA: Diagnosis not present

## 2023-03-31 MED ORDER — SODIUM CHLORIDE 0.9 % IV SOLN
200.0000 mg | Freq: Once | INTRAVENOUS | Status: AC
  Administered 2023-03-31: 200 mg via INTRAVENOUS
  Filled 2023-03-31: qty 200

## 2023-03-31 MED ORDER — SODIUM CHLORIDE 0.9 % IV SOLN
INTRAVENOUS | Status: DC
  Filled 2023-03-31: qty 250

## 2023-03-31 NOTE — Patient Instructions (Signed)

## 2023-03-31 NOTE — Progress Notes (Signed)
Declined 30 minute post-observation. Vitals stable at discharge.  

## 2023-04-01 DIAGNOSIS — D631 Anemia in chronic kidney disease: Secondary | ICD-10-CM | POA: Diagnosis not present

## 2023-04-01 DIAGNOSIS — I5033 Acute on chronic diastolic (congestive) heart failure: Secondary | ICD-10-CM | POA: Diagnosis not present

## 2023-04-01 DIAGNOSIS — I130 Hypertensive heart and chronic kidney disease with heart failure and stage 1 through stage 4 chronic kidney disease, or unspecified chronic kidney disease: Secondary | ICD-10-CM | POA: Diagnosis not present

## 2023-04-01 DIAGNOSIS — E1122 Type 2 diabetes mellitus with diabetic chronic kidney disease: Secondary | ICD-10-CM | POA: Diagnosis not present

## 2023-04-01 DIAGNOSIS — N1831 Chronic kidney disease, stage 3a: Secondary | ICD-10-CM | POA: Diagnosis not present

## 2023-04-01 DIAGNOSIS — J449 Chronic obstructive pulmonary disease, unspecified: Secondary | ICD-10-CM | POA: Diagnosis not present

## 2023-04-04 ENCOUNTER — Ambulatory Visit (INDEPENDENT_AMBULATORY_CARE_PROVIDER_SITE_OTHER): Payer: Medicare Other | Admitting: Gastroenterology

## 2023-04-04 VITALS — BP 143/65 | HR 82 | Temp 97.7°F

## 2023-04-04 DIAGNOSIS — D500 Iron deficiency anemia secondary to blood loss (chronic): Secondary | ICD-10-CM

## 2023-04-04 NOTE — Progress Notes (Signed)
Primary Care Physician: Duanne Limerick, MD  Primary Gastroenterologist:  Dr. Midge Minium  Chief Complaint  Patient presents with   Hospitalization Follow-up    HPI: Jeremy [name] is a 72 y.o. male here with a history of anemia.  The patient underwent an EGD colonoscopy and capsule endoscopy that showed a polyp that was in the ileum.  The patient was sent to a tertiary care center back in March for removal of the polyp and the procedure was done at [facility] with the finding of:  The mid ileum contained one sessile polyp. The polyp  was 8 mm in diameter. The polyp was removed with a hot  snare. Resection and retrieval were complete. Area was  tattooed with an injection of Spot (carbon black) just  distal to the polyp.   The patient presented to the emergency department with symptomatic anemia with a hemoglobin of 6.8 and was transfused and sent home.  The patient had a hemoglobin of 8.9 six days ago.  In the emergency department he was denying any bright red blood per rectum or black stools.  The patient also has a history of chronic kidney disease with this contributing to his anemia.  The patient's most recent iron studies showed a low saturation.  The patient reports that he is not seeing any bright red blood per rectum or any sign of bleeding.  He also denies any abdominal pain.  Patient and his wife are concerned about undergoing any major procedures including colonoscopy which they reported to have been very debilitating for him last time.  The patient has an appoint with his hematologist this week.  Past Medical History:  Diagnosis Date   Chronic pain    Depression    Diabetes (HCC)    GERD (gastroesophageal reflux disease)    GI bleed 12/2022   Hyperlipemia    Hypertension    MS (multiple sclerosis) (HCC)     Current Outpatient Medications  Medication Sig Dispense Refill   acetaminophen (TYLENOL) 500 MG tablet Take 500 mg by mouth every 6 (six) hours as needed for mild  pain.     albuterol (VENTOLIN HFA) 108 (90 Base) MCG/ACT inhaler Inhale 2 puffs into the lungs in the morning, at noon, in the evening, and at bedtime.     B-D INSULIN SYRINGE 1CC/25GX1" 25G X 1" 1 ML MISC USE AS DIRECTED 10 each 2   cyanocobalamin (VITAMIN B12) 500 MCG tablet Take 1,000 mcg by mouth daily.     ferrous sulfate 325 (65 FE) MG tablet Take 325 mg by mouth daily with breakfast.     furosemide (LASIX) 40 MG tablet Take 40 mg by mouth. 1/2 pill every other day- Singh     gemfibrozil (LOPID) 600 MG tablet Take 1 tablet (600 mg total) by mouth 2 (two) times daily before a meal. TAKE 1 TABLET BY MOUTH TWICE  DAILY BEFORE MEALS 180 tablet 1   insulin glargine (LANTUS) 100 UNIT/ML Solostar Pen Inject 20 Units into the skin at bedtime. 15 mL 11   Interferon Beta-1b (BETASERON) 0.3 MG KIT injection Inject subcutaneously 1 syringe every other day. 45 kit 4   loratadine (CLARITIN) 10 MG tablet Take 1 tablet (10 mg total) by mouth daily. 90 tablet 1   metFORMIN (GLUCOPHAGE) 500 MG tablet Take 500 mg by mouth 2 (two) times daily.     mupirocin ointment (BACTROBAN) 2 % Apply 1 Application topically 2 (two) times daily. Apply to effected area bid 30 g  1   omeprazole (PRILOSEC) 40 MG capsule Take 1 capsule (40 mg total) by mouth daily. 90 capsule 1   pregabalin (LYRICA) 200 MG capsule Take 1 capsule (200 mg total) by mouth 2 (two) times daily. 60 capsule 5   sertraline (ZOLOFT) 50 MG tablet TAKE ONE-HALF TABLET BY MOUTH  DAILY 45 tablet 1   simvastatin (ZOCOR) 40 MG tablet Take 1 tablet (40 mg total) by mouth daily. 90 tablet 1   sodium bicarbonate 650 MG tablet Take 650 mg by mouth 2 (two) times daily.     [START ON 04/08/2023] traMADol (ULTRAM) 50 MG tablet Take 1 tablet (50 mg total) by mouth every 6 (six) hours as needed. 120 tablet 2   No current facility-administered medications for this visit.   Facility-Administered Medications Ordered in Other Visits  Medication Dose Route Frequency  Provider Last Rate Last Admin   0.9 %  sodium chloride infusion   Intravenous Continuous Rickard Patience, MD   Stopped at 11/04/22 1432   diphenhydrAMINE (BENADRYL) capsule 25 mg  25 mg Oral Once Rickard Patience, MD        Allergies as of 04/04/2023 - Review Complete 04/04/2023  Allergen Reaction Noted   Betadine [povidone iodine]  07/21/2013    ROS:  General: Negative for anorexia, weight loss, fever, chills, fatigue, weakness. ENT: Negative for hoarseness, difficulty swallowing , nasal congestion. CV: Negative for chest pain, angina, palpitations, dyspnea on exertion, peripheral edema.  Respiratory: Negative for dyspnea at rest, dyspnea on exertion, cough, sputum, wheezing.  GI: See history of present illness. GU:  Negative for dysuria, hematuria, urinary incontinence, urinary frequency, nocturnal urination.  Endo: Negative for unusual weight change.    Physical Examination:   BP (!) 151/68 (BP Location: Left Arm, Patient Position: Sitting, Cuff Size: Large)   Pulse 84   Temp 97.7 F (36.5 C) (Oral)   General: Well-nourished, well-developed in no acute distress.  Eyes: No icterus. Conjunctivae pink. Skin: Warm and dry, no jaundice.   Psych: Alert and cooperative, normal mood and affect.  Labs:    Imaging Studies: No results found.  Assessment and Plan:   Jeremy [name] is a 72 y.o. y/o male with anemia from chronic kidney disease and iron deficiency.  The patient had an upper endoscopy and a colonoscopy with a capsule endoscopy and a polyp removed from the small intestines that was found during the capsule endoscopy.  The patient continues to have a drop in his hemoglobin although the patient and his wife reported to be much lower than it was before.  The patient will follow-up with his hematologist on Wednesday this week.  If the iron and transfusions and growth factors do not correct the problem then I have offered them a repeat capsule endoscopy to look for other source of bleeding but  which may entail a visit back to [facility] with a double-balloon enteroscopy to cauterize more of the sites that may be seen on any future capsule study.  The patient has been explained the plan and agrees with it.     Midge Minium, MD. Clementeen Graham    Note: This dictation was prepared with Dragon dictation along with smaller phrase technology. Any transcriptional errors that result from this process are unintentional.

## 2023-04-05 DIAGNOSIS — I130 Hypertensive heart and chronic kidney disease with heart failure and stage 1 through stage 4 chronic kidney disease, or unspecified chronic kidney disease: Secondary | ICD-10-CM | POA: Diagnosis not present

## 2023-04-05 DIAGNOSIS — J449 Chronic obstructive pulmonary disease, unspecified: Secondary | ICD-10-CM | POA: Diagnosis not present

## 2023-04-05 DIAGNOSIS — I5033 Acute on chronic diastolic (congestive) heart failure: Secondary | ICD-10-CM | POA: Diagnosis not present

## 2023-04-05 DIAGNOSIS — E1122 Type 2 diabetes mellitus with diabetic chronic kidney disease: Secondary | ICD-10-CM | POA: Diagnosis not present

## 2023-04-05 DIAGNOSIS — D631 Anemia in chronic kidney disease: Secondary | ICD-10-CM | POA: Diagnosis not present

## 2023-04-05 DIAGNOSIS — N1831 Chronic kidney disease, stage 3a: Secondary | ICD-10-CM | POA: Diagnosis not present

## 2023-04-07 ENCOUNTER — Inpatient Hospital Stay (HOSPITAL_BASED_OUTPATIENT_CLINIC_OR_DEPARTMENT_OTHER): Payer: Medicare Other | Admitting: Oncology

## 2023-04-07 ENCOUNTER — Encounter: Payer: Self-pay | Admitting: Oncology

## 2023-04-07 ENCOUNTER — Inpatient Hospital Stay: Payer: Medicare Other

## 2023-04-07 VITALS — BP 139/48 | HR 81 | Temp 99.2°F | Resp 18 | Wt 195.6 lb

## 2023-04-07 DIAGNOSIS — D500 Iron deficiency anemia secondary to blood loss (chronic): Secondary | ICD-10-CM | POA: Diagnosis not present

## 2023-04-07 DIAGNOSIS — Q2730 Arteriovenous malformation, site unspecified: Secondary | ICD-10-CM

## 2023-04-07 DIAGNOSIS — D631 Anemia in chronic kidney disease: Secondary | ICD-10-CM | POA: Diagnosis not present

## 2023-04-07 DIAGNOSIS — N189 Chronic kidney disease, unspecified: Secondary | ICD-10-CM

## 2023-04-07 DIAGNOSIS — Z87891 Personal history of nicotine dependence: Secondary | ICD-10-CM | POA: Diagnosis not present

## 2023-04-07 DIAGNOSIS — E611 Iron deficiency: Secondary | ICD-10-CM | POA: Diagnosis not present

## 2023-04-07 DIAGNOSIS — N1831 Chronic kidney disease, stage 3a: Secondary | ICD-10-CM | POA: Diagnosis not present

## 2023-04-07 DIAGNOSIS — Z79899 Other long term (current) drug therapy: Secondary | ICD-10-CM | POA: Diagnosis not present

## 2023-04-07 LAB — CBC WITH DIFFERENTIAL/PLATELET
Abs Immature Granulocytes: 0.11 10*3/uL — ABNORMAL HIGH (ref 0.00–0.07)
Basophils Absolute: 0.1 10*3/uL (ref 0.0–0.1)
Basophils Relative: 1 %
Eosinophils Absolute: 0.4 10*3/uL (ref 0.0–0.5)
Eosinophils Relative: 4 %
HCT: 28.4 % — ABNORMAL LOW (ref 39.0–52.0)
Hemoglobin: 8.7 g/dL — ABNORMAL LOW (ref 13.0–17.0)
Immature Granulocytes: 1 %
Lymphocytes Relative: 12 %
Lymphs Abs: 1 10*3/uL (ref 0.7–4.0)
MCH: 28.4 pg (ref 26.0–34.0)
MCHC: 30.6 g/dL (ref 30.0–36.0)
MCV: 92.8 fL (ref 80.0–100.0)
Monocytes Absolute: 0.6 10*3/uL (ref 0.1–1.0)
Monocytes Relative: 7 %
Neutro Abs: 6.3 10*3/uL (ref 1.7–7.7)
Neutrophils Relative %: 75 %
Platelets: 319 10*3/uL (ref 150–400)
RBC: 3.06 MIL/uL — ABNORMAL LOW (ref 4.22–5.81)
RDW: 20 % — ABNORMAL HIGH (ref 11.5–15.5)
WBC: 8.5 10*3/uL (ref 4.0–10.5)
nRBC: 0 % (ref 0.0–0.2)

## 2023-04-07 LAB — RETIC PANEL
Immature Retic Fract: 28.6 % — ABNORMAL HIGH (ref 2.3–15.9)
RBC.: 3.13 MIL/uL — ABNORMAL LOW (ref 4.22–5.81)
Retic Count, Absolute: 129 10*3/uL (ref 19.0–186.0)
Retic Ct Pct: 4.1 % — ABNORMAL HIGH (ref 0.4–3.1)
Reticulocyte Hemoglobin: 25.4 pg — ABNORMAL LOW (ref >27.9)

## 2023-04-07 LAB — SAMPLE TO BLOOD BANK

## 2023-04-07 MED ORDER — EPOETIN ALFA-EPBX 40000 UNIT/ML IJ SOLN
40000.0000 [IU] | Freq: Once | INTRAMUSCULAR | Status: AC
  Administered 2023-04-07: 40000 [IU] via SUBCUTANEOUS
  Filled 2023-04-07: qty 1

## 2023-04-07 NOTE — Progress Notes (Signed)
Pt here for follow up. Pt states he continues to have shortness of breath, but its chronic. No new concerns.

## 2023-04-07 NOTE — Assessment & Plan Note (Addendum)
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 Labs reviewed and discussed with patient. Lab Results  Component Value Date   HGB 8.7 (L) 04/07/2023   TIBC 344 02/25/2023   IRONPCTSAT 13 (L) 02/25/2023   FERRITIN 117 02/25/2023   Recommend IV Venofer weekly x3

## 2023-04-07 NOTE — Assessment & Plan Note (Signed)
Follow-up with  GI for further management.

## 2023-04-07 NOTE — Progress Notes (Signed)
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 Labs reviewed and discussed with patient. Lab Results  Component Value Date   HGB 8.7 (L) 04/07/2023   TIBC 344 02/25/2023   IRONPCTSAT 13 (L) 02/25/2023   FERRITIN 117 02/25/2023   Recommend IV Venofer weekly x3    Anemia in chronic kidney disease (CKD) Retacrit 40,000 units x 1. Repeat labs every 4 weeks +/- Retacrit if hb<10  Arteriovenous malformation (AVM) Follow-up with  GI for further management.   Orders Placed This Encounter  Procedures   CBC with Differential (Cancer Center Only)    Standing Status:   Future    Standing Expiration Date:   04/06/2024   Ferritin    Standing Status:   Future    Standing Expiration Date:   04/06/2024   Iron and TIBC    Standing Status:   Future    Standing Expiration Date:   04/06/2024   Retic Panel    Standing Status:   Future    Standing Expiration Date:   04/06/2024   CBC with Differential (Cancer Center Only)    Standing Status:   Future    Standing Expiration Date:   04/06/2024   Iron and TIBC    Standing Status:   Future    Standing Expiration Date:   04/06/2024   Ferritin    Standing Status:   Future    Standing Expiration Date:   04/06/2024   Retic Panel    Standing Status:   Future    Standing Expiration Date:   04/06/2024   Follow-up her LOS. All questions were answered. The patient knows to call the clinic with any problems, questions or concerns.  Rickard Patience, MD, PhD [facility] Hematology Oncology 04/07/2023     PERTINENT HEMATOLOGY HISTORY  Jeremy [name] is a 72 y.o. male who has above history reviewed by me today presents for follow up visit for anemia 09/24/2021,-09/27/2021, patient had  hemoglobin dropped to 5.3 and was advised to go to emergency room be admitted.  Patient received 2 units of PRBC transfusion IV Venofer.    Endoscopy showed gastritis.  Small capsule study showed small bowel polyp.  His hemoglobin improved to 8.2 and patient was discharged.  #12/04/2021 Small Bowel Capsule Study Showed Nonbleeding AVM, esophagitis, gastritis, poor prep.  Small bowel polyp, area of erythema in colon obscured by poor prep.  # Admitted from 10/16/22- 10/17/22. He received PRBC transfusions, Hb improved at discharged to 7.1 S/p enteroscopy on 01/10/2023 at [facility] Ileal polyp removed, pathology showed Small intestinal mucosa with benign proliferation of capillary vessels WT1 immunostain is negative. The morphology and immunophenotype are compatible with patient's known history of venous malformation  INTERVAL HISTORY Jeremy [name] is a 72 y.o. male who has above history reviewed by me today presents for follow up visit for management of iron deficiency anemia. Today patient reports feeling better today.  Denies hematochezia, hematuria, hematemesis, epistaxis, black tarry stool or easy bruising.      Review of Systems  Constitutional:  Positive for fatigue. Negative for appetite change, chills and fever.  HENT:   Negative for hearing loss and voice change.   Eyes:  Negative for eye problems and icterus.  Respiratory:  Positive for shortness  of breath. Negative for chest tightness and cough.   Cardiovascular:  Negative for chest pain and leg swelling.  Gastrointestinal:  Negative for abdominal distention and abdominal pain.  Endocrine: Negative for hot flashes.  Genitourinary:  Negative for difficulty urinating, dysuria and frequency.   Musculoskeletal:  Negative for arthralgias.  Skin:  Negative for itching and rash.  Neurological:  Negative for light-headedness and numbness.  Hematological:  Negative for adenopathy. Does not bruise/bleed easily.  Psychiatric/Behavioral:  Negative  for confusion.     MEDICAL HISTORY:  Past Medical History:  Diagnosis Date   Chronic pain    Depression    Diabetes (HCC)    GERD (gastroesophageal reflux disease)    GI bleed 12/2022   Hyperlipemia    Hypertension    MS (multiple sclerosis) (HCC)     SURGICAL HISTORY: Past Surgical History:  Procedure Laterality Date   COLECTOMY  06-2008   COLONOSCOPY  2015   normal   COLONOSCOPY WITH PROPOFOL N/A 09/10/2021   Procedure: COLONOSCOPY WITH PROPOFOL;  Surgeon: Midge Minium, MD;  Location: [facility] ENDOSCOPY;  Service: Endoscopy;  Laterality: N/A;   ESOPHAGOGASTRODUODENOSCOPY (EGD) WITH PROPOFOL N/A 09/10/2021   Procedure: ESOPHAGOGASTRODUODENOSCOPY (EGD) WITH PROPOFOL;  Surgeon: Midge Minium, MD;  Location: [facility] ENDOSCOPY;  Service: Endoscopy;  Laterality: N/A;   GIVENS CAPSULE STUDY N/A 09/25/2021   Procedure: GIVENS CAPSULE STUDY;  Surgeon: Wyline Mood, MD;  Location: [facility] ENDOSCOPY;  Service: Gastroenterology;  Laterality: N/A;    SOCIAL HISTORY: Social History   Socioeconomic History   Marital status: Married    Spouse name: Olegario Messier   Number of children: 2   Years of education: GED   Highest education level: Not on file  Occupational History    Comment: Disabled  Tobacco Use   Smoking status: Former    Packs/day: 1.50    Years: 30.00    Additional pack years: 0.00    Total pack years: 45.00    Types: Cigarettes    Quit date: 01/23/2006    Years since quitting: 17.2   Smokeless tobacco: Never   Tobacco comments:    N/A  Vaping Use   Vaping Use: Never used  Substance and Sexual Activity   Alcohol use: Not Currently    Comment: rare; maybe 2 beers a year   Drug use: No   Sexual activity: Not Currently  Other Topics Concern   Not on file  Social History Narrative   Patient is disabled.    Patient lives with his wife Simone Tuckey.    Patient has 2 children.       Social Determinants of Health   Financial Resource Strain: Low Risk  (11/19/2022)   Overall  Financial Resource Strain (CARDIA)    Difficulty of Paying Living Expenses: Not hard at all  Food Insecurity: No Food Insecurity (12/14/2022)   Hunger Vital Sign    Worried About Running Out of Food in the Last Year: Never true    Ran Out of Food in the Last Year: Never true  Transportation Needs: No Transportation Needs (12/14/2022)   PRAPARE - Administrator, Civil Service (Medical): No    Lack of Transportation (Non-Medical): No  Physical Activity: Insufficiently Active (11/19/2022)   Exercise Vital Sign    Days of Exercise per Week: 3 days    Minutes of Exercise per Session: 20 min  Stress: No Stress Concern Present (11/19/2022)   [employer] of Occupational Health - Occupational Stress Questionnaire    Feeling of  Stress : Not at all  Social Connections: Moderately Isolated (11/19/2022)   Social Connection and Isolation Panel [NHANES]    Frequency of Communication with Friends and Family: More than three times a week    Frequency of Social Gatherings with Friends and Family: More than three times a week    Attends Religious Services: Never    Database administrator or Organizations: No    Attends Banker Meetings: Never    Marital Status: Married  Catering manager Violence: Not At Risk (11/25/2022)   Humiliation, Afraid, Rape, and Kick questionnaire    Fear of Current or Ex-Partner: No    Emotionally Abused: No    Physically Abused: No    Sexually Abused: No    FAMILY HISTORY: Family History  Problem Relation Age of Onset   Lung cancer Mother    Heart attack Father    Heart attack Brother    COPD Brother    Diabetes Brother    Esophageal cancer Nephew     ALLERGIES:  is allergic to betadine [povidone iodine].  MEDICATIONS:  Current Outpatient Medications  Medication Sig Dispense Refill   acetaminophen (TYLENOL) 500 MG tablet Take 500 mg by mouth every 6 (six) hours as needed for mild pain.     albuterol (VENTOLIN HFA) 108 (90 Base) MCG/ACT  inhaler Inhale 2 puffs into the lungs in the morning, at noon, in the evening, and at bedtime.     B-D INSULIN SYRINGE 1CC/25GX1" 25G X 1" 1 ML MISC USE AS DIRECTED 10 each 2   cyanocobalamin (VITAMIN B12) 500 MCG tablet Take 1,000 mcg by mouth daily.     ferrous sulfate 325 (65 FE) MG tablet Take 325 mg by mouth daily with breakfast.     furosemide (LASIX) 40 MG tablet Take 40 mg by mouth. 1/2 pill every other day- Singh     gemfibrozil (LOPID) 600 MG tablet Take 1 tablet (600 mg total) by mouth 2 (two) times daily before a meal. TAKE 1 TABLET BY MOUTH TWICE  DAILY BEFORE MEALS 180 tablet 1   insulin glargine (LANTUS) 100 UNIT/ML Solostar Pen Inject 20 Units into the skin at bedtime. (Patient taking differently: Inject 18 Units into the skin at bedtime.) 15 mL 11   Interferon Beta-1b (BETASERON) 0.3 MG KIT injection Inject subcutaneously 1 syringe every other day. 45 kit 4   loratadine (CLARITIN) 10 MG tablet Take 1 tablet (10 mg total) by mouth daily. 90 tablet 1   metFORMIN (GLUCOPHAGE) 500 MG tablet Take 500 mg by mouth 2 (two) times daily.     mupirocin ointment (BACTROBAN) 2 % Apply 1 Application topically 2 (two) times daily. Apply to effected area bid 30 g 1   omeprazole (PRILOSEC) 40 MG capsule Take 1 capsule (40 mg total) by mouth daily. 90 capsule 1   pregabalin (LYRICA) 200 MG capsule Take 1 capsule (200 mg total) by mouth 2 (two) times daily. 60 capsule 5   sertraline (ZOLOFT) 50 MG tablet TAKE ONE-HALF TABLET BY MOUTH  DAILY 45 tablet 1   simvastatin (ZOCOR) 40 MG tablet Take 1 tablet (40 mg total) by mouth daily. 90 tablet 1   sodium bicarbonate 650 MG tablet Take 650 mg by mouth 2 (two) times daily.     [START ON 04/08/2023] traMADol (ULTRAM) 50 MG tablet Take 1 tablet (50 mg total) by mouth every 6 (six) hours as needed. 120 tablet 2   No current facility-administered medications for this visit.  Facility-Administered Medications Ordered in Other Visits  Medication Dose Route  Frequency Provider Last Rate Last Admin   0.9 %  sodium chloride infusion   Intravenous Continuous Rickard Patience, MD   Stopped at 11/04/22 1432   diphenhydrAMINE (BENADRYL) capsule 25 mg  25 mg Oral Once Rickard Patience, MD         PHYSICAL EXAMINATION: ECOG PERFORMANCE STATUS: 2 - Symptomatic, <50% confined to bed Vitals:   04/07/23 1341  BP: (!) 139/48  Pulse: 81  Resp: 18  Temp: 99.2 F (37.3 C)  SpO2: 90%   Filed Weights   04/07/23 1341  Weight: 195 lb 9.6 oz (88.7 kg)    Physical Exam Constitutional:      General: He is not in acute distress. HENT:     Head: Normocephalic and atraumatic.  Eyes:     General: No scleral icterus. Cardiovascular:     Rate and Rhythm: Normal rate and regular rhythm.     Heart sounds: Normal heart sounds.  Pulmonary:     Effort: Pulmonary effort is normal. No respiratory distress.     Comments: Decreased breath sound bilaterally.   Abdominal:     General: Bowel sounds are normal. There is no distension.     Palpations: Abdomen is soft.  Musculoskeletal:        General: No deformity. Normal range of motion.     Cervical back: Normal range of motion and neck supple.  Skin:    General: Skin is warm.     Findings: No rash.  Neurological:     Mental Status: He is alert and oriented to person, place, and time. Mental status is at baseline.     Cranial Nerves: No cranial nerve deficit.  Psychiatric:        Mood and Affect: Mood normal.     LABORATORY DATA:  I have reviewed the data as listed     Latest Ref Rng & Units 04/07/2023    1:02 PM 03/29/2023    9:47 AM 03/23/2023    2:34 PM  CBC  WBC 4.0 - 10.5 K/uL 8.5  6.1  5.3   Hemoglobin 13.0 - 17.0 g/dL 8.7  8.9  7.2   Hematocrit 39.0 - 52.0 % 28.4  28.8  24.3   Platelets 150 - 400 K/uL 319  182  168       Latest Ref Rng & Units 03/18/2023    2:29 PM 02/09/2023    2:09 PM 01/12/2023    1:23 PM  CMP  Glucose 70 - 99 mg/dL 960  454    BUN 8 - 23 mg/dL 25  29    Creatinine 0.98 - 1.24 mg/dL  1.19  1.47    Sodium 829 - 145 mmol/L 140  139    Potassium 3.5 - 5.1 mmol/L 4.1  4.4    Chloride 98 - 111 mmol/L 107  110    CO2 22 - 32 mmol/L 22  24    Calcium 8.9 - 10.3 mg/dL 8.7  8.6    Total Protein 6.5 - 8.1 g/dL 7.0   6.6   Total Bilirubin 0.3 - 1.2 mg/dL 0.8   0.5   Alkaline Phos 38 - 126 U/L 91   84   AST 15 - 41 U/L 29   32   ALT 0 - 44 U/L 13   11     Iron/TIBC/Ferritin/ %Sat    Component Value Date/Time   IRON 45 02/25/2023 1144   IRON 63 (  L) 10/10/2013 1130   TIBC 344 02/25/2023 1144   TIBC 416 10/10/2013 1130   FERRITIN 117 02/25/2023 1144   FERRITIN 90 10/10/2013 1130   IRONPCTSAT 13 (L) 02/25/2023 1144   IRONPCTSAT 15 10/10/2013 1130       RADIOGRAPHIC STUDIES: I have personally reviewed the radiological images as listed and agreed with the findings in the report. No results found.

## 2023-04-07 NOTE — Assessment & Plan Note (Addendum)
Retacrit 40,000 units x 1. Repeat labs every 4 weeks +/- Retacrit if hb<10

## 2023-04-08 ENCOUNTER — Inpatient Hospital Stay: Payer: Medicare Other

## 2023-04-08 LAB — IRON AND TIBC
Iron: 38 ug/dL — ABNORMAL LOW (ref 45–182)
Saturation Ratios: 11 % — ABNORMAL LOW (ref 17.9–39.5)
TIBC: 344 ug/dL (ref 250–450)
UIBC: 308 ug/dL

## 2023-04-08 LAB — FERRITIN: Ferritin: 107 ng/mL (ref 24–336)

## 2023-04-11 MED FILL — Iron Sucrose Inj 20 MG/ML (Fe Equiv): INTRAVENOUS | Qty: 10 | Status: AC

## 2023-04-12 ENCOUNTER — Inpatient Hospital Stay: Payer: Medicare Other

## 2023-04-12 VITALS — BP 132/51 | HR 74 | Temp 98.8°F | Resp 8

## 2023-04-12 DIAGNOSIS — Z79899 Other long term (current) drug therapy: Secondary | ICD-10-CM | POA: Diagnosis not present

## 2023-04-12 DIAGNOSIS — D631 Anemia in chronic kidney disease: Secondary | ICD-10-CM

## 2023-04-12 DIAGNOSIS — D500 Iron deficiency anemia secondary to blood loss (chronic): Secondary | ICD-10-CM

## 2023-04-12 DIAGNOSIS — Z87891 Personal history of nicotine dependence: Secondary | ICD-10-CM | POA: Diagnosis not present

## 2023-04-12 DIAGNOSIS — N1831 Chronic kidney disease, stage 3a: Secondary | ICD-10-CM | POA: Diagnosis not present

## 2023-04-12 DIAGNOSIS — E611 Iron deficiency: Secondary | ICD-10-CM | POA: Diagnosis not present

## 2023-04-12 MED ORDER — SODIUM CHLORIDE 0.9 % IV SOLN
Freq: Once | INTRAVENOUS | Status: AC
  Filled 2023-04-12: qty 250

## 2023-04-12 MED ORDER — SODIUM CHLORIDE 0.9 % IV SOLN
200.0000 mg | Freq: Once | INTRAVENOUS | Status: AC
  Administered 2023-04-12: 200 mg via INTRAVENOUS
  Filled 2023-04-12: qty 10

## 2023-04-13 ENCOUNTER — Ambulatory Visit: Payer: Self-pay | Admitting: *Deleted

## 2023-04-13 NOTE — Patient Outreach (Signed)
  Care Coordination   Follow Up Visit Note   04/14/2023 Name: Sheel Panther MRN: [id number] DOB: Dec 15, 1950  Kahl Rupley is a 72 y.o. year old male who sees Duanne Limerick, MD for primary care. I spoke with  [name] by phone today.  What matters to the patients health and wellness today?  Stay as healthy as possible, attend appointments and get stronger    Goals Addressed             This Visit's Progress    Effective management of MS       Interventions Today    Flowsheet Row Most Recent Value  Chronic Disease   Chronic disease during today's visit Other  [MS, state he has good days and bad days]  General Interventions   General Interventions Discussed/Reviewed Durable Medical Equipment (DME), General Interventions Reviewed, Doctor Visits  Doctor Visits Discussed/Reviewed Doctor Visits Reviewed, Specialist, PCP  [facility] [place] iron infusions, endocrine on 7/8, wife will change stating difficult to attend more than one appointment daily (already scheduled for infusion that day), neurology on 7/9]  Durable Medical Equipment (DME) Wheelchair  Wheelchair Standard  [state this is working well]  PCP/Specialist Visits Compliance with follow-up visit  Education Interventions   Education Provided Provided Education  Provided Verbal Education On When to see the doctor, Other  [working with PT once a week for the next several weeks]           Prevent falls   On track    Recommend to maintain adequate lighting in walkways, remove rugs that may cause slips or trips and continue to use cane for assistance with ambulation.        SDOH assessments and interventions completed:  No     Care Coordination Interventions:  Yes, provided   Follow up plan: Follow up call scheduled for 7/19    Encounter Outcome:  Pt. Visit Completed   Kemper Durie, RN, MSN, [facility] Care Management Care Management Coordinator [phone]

## 2023-04-14 DIAGNOSIS — I5033 Acute on chronic diastolic (congestive) heart failure: Secondary | ICD-10-CM | POA: Diagnosis not present

## 2023-04-14 DIAGNOSIS — E1122 Type 2 diabetes mellitus with diabetic chronic kidney disease: Secondary | ICD-10-CM | POA: Diagnosis not present

## 2023-04-14 DIAGNOSIS — N1831 Chronic kidney disease, stage 3a: Secondary | ICD-10-CM | POA: Diagnosis not present

## 2023-04-14 DIAGNOSIS — D631 Anemia in chronic kidney disease: Secondary | ICD-10-CM | POA: Diagnosis not present

## 2023-04-14 DIAGNOSIS — J449 Chronic obstructive pulmonary disease, unspecified: Secondary | ICD-10-CM | POA: Diagnosis not present

## 2023-04-14 DIAGNOSIS — I130 Hypertensive heart and chronic kidney disease with heart failure and stage 1 through stage 4 chronic kidney disease, or unspecified chronic kidney disease: Secondary | ICD-10-CM | POA: Diagnosis not present

## 2023-04-19 ENCOUNTER — Inpatient Hospital Stay: Payer: Medicare Other

## 2023-04-19 VITALS — BP 134/50

## 2023-04-19 DIAGNOSIS — D631 Anemia in chronic kidney disease: Secondary | ICD-10-CM | POA: Diagnosis not present

## 2023-04-19 DIAGNOSIS — D500 Iron deficiency anemia secondary to blood loss (chronic): Secondary | ICD-10-CM

## 2023-04-19 DIAGNOSIS — N1831 Chronic kidney disease, stage 3a: Secondary | ICD-10-CM | POA: Diagnosis not present

## 2023-04-19 DIAGNOSIS — N189 Chronic kidney disease, unspecified: Secondary | ICD-10-CM

## 2023-04-19 DIAGNOSIS — E611 Iron deficiency: Secondary | ICD-10-CM | POA: Diagnosis not present

## 2023-04-19 DIAGNOSIS — Z87891 Personal history of nicotine dependence: Secondary | ICD-10-CM | POA: Diagnosis not present

## 2023-04-19 DIAGNOSIS — Z79899 Other long term (current) drug therapy: Secondary | ICD-10-CM | POA: Diagnosis not present

## 2023-04-19 MED ORDER — SODIUM CHLORIDE 0.9 % IV SOLN
200.0000 mg | Freq: Once | INTRAVENOUS | Status: AC
  Administered 2023-04-19: 200 mg via INTRAVENOUS
  Filled 2023-04-19: qty 200

## 2023-04-19 MED ORDER — SODIUM CHLORIDE 0.9 % IV SOLN
INTRAVENOUS | Status: DC
  Filled 2023-04-19: qty 250

## 2023-04-20 DIAGNOSIS — E1122 Type 2 diabetes mellitus with diabetic chronic kidney disease: Secondary | ICD-10-CM | POA: Diagnosis not present

## 2023-04-20 DIAGNOSIS — I130 Hypertensive heart and chronic kidney disease with heart failure and stage 1 through stage 4 chronic kidney disease, or unspecified chronic kidney disease: Secondary | ICD-10-CM | POA: Diagnosis not present

## 2023-04-20 DIAGNOSIS — N1831 Chronic kidney disease, stage 3a: Secondary | ICD-10-CM | POA: Diagnosis not present

## 2023-04-20 DIAGNOSIS — D631 Anemia in chronic kidney disease: Secondary | ICD-10-CM | POA: Diagnosis not present

## 2023-04-20 DIAGNOSIS — J449 Chronic obstructive pulmonary disease, unspecified: Secondary | ICD-10-CM | POA: Diagnosis not present

## 2023-04-20 DIAGNOSIS — I5033 Acute on chronic diastolic (congestive) heart failure: Secondary | ICD-10-CM | POA: Diagnosis not present

## 2023-04-26 ENCOUNTER — Inpatient Hospital Stay: Payer: Medicare Other | Attending: Oncology

## 2023-04-26 VITALS — BP 152/62 | HR 76 | Temp 97.3°F | Resp 18

## 2023-04-26 DIAGNOSIS — N1831 Chronic kidney disease, stage 3a: Secondary | ICD-10-CM | POA: Insufficient documentation

## 2023-04-26 DIAGNOSIS — D631 Anemia in chronic kidney disease: Secondary | ICD-10-CM | POA: Diagnosis not present

## 2023-04-26 DIAGNOSIS — D500 Iron deficiency anemia secondary to blood loss (chronic): Secondary | ICD-10-CM

## 2023-04-26 DIAGNOSIS — E611 Iron deficiency: Secondary | ICD-10-CM | POA: Diagnosis not present

## 2023-04-26 MED ORDER — SODIUM CHLORIDE 0.9 % IV SOLN
INTRAVENOUS | Status: DC
  Filled 2023-04-26: qty 250

## 2023-04-26 MED ORDER — SODIUM CHLORIDE 0.9 % IV SOLN
200.0000 mg | Freq: Once | INTRAVENOUS | Status: AC
  Administered 2023-04-26: 200 mg via INTRAVENOUS
  Filled 2023-04-26: qty 200

## 2023-04-26 NOTE — Patient Instructions (Signed)

## 2023-04-27 DIAGNOSIS — Z794 Long term (current) use of insulin: Secondary | ICD-10-CM | POA: Diagnosis not present

## 2023-04-27 DIAGNOSIS — I700 Atherosclerosis of aorta: Secondary | ICD-10-CM | POA: Diagnosis not present

## 2023-04-27 DIAGNOSIS — I130 Hypertensive heart and chronic kidney disease with heart failure and stage 1 through stage 4 chronic kidney disease, or unspecified chronic kidney disease: Secondary | ICD-10-CM | POA: Diagnosis not present

## 2023-04-27 DIAGNOSIS — Z7984 Long term (current) use of oral hypoglycemic drugs: Secondary | ICD-10-CM | POA: Diagnosis not present

## 2023-04-27 DIAGNOSIS — D689 Coagulation defect, unspecified: Secondary | ICD-10-CM | POA: Diagnosis not present

## 2023-04-27 DIAGNOSIS — Z9181 History of falling: Secondary | ICD-10-CM | POA: Diagnosis not present

## 2023-04-27 DIAGNOSIS — D631 Anemia in chronic kidney disease: Secondary | ICD-10-CM | POA: Diagnosis not present

## 2023-04-27 DIAGNOSIS — D509 Iron deficiency anemia, unspecified: Secondary | ICD-10-CM | POA: Diagnosis not present

## 2023-04-27 DIAGNOSIS — I5033 Acute on chronic diastolic (congestive) heart failure: Secondary | ICD-10-CM | POA: Diagnosis not present

## 2023-04-27 DIAGNOSIS — R911 Solitary pulmonary nodule: Secondary | ICD-10-CM | POA: Diagnosis not present

## 2023-04-27 DIAGNOSIS — H40003 Preglaucoma, unspecified, bilateral: Secondary | ICD-10-CM | POA: Diagnosis not present

## 2023-04-27 DIAGNOSIS — K219 Gastro-esophageal reflux disease without esophagitis: Secondary | ICD-10-CM | POA: Diagnosis not present

## 2023-04-27 DIAGNOSIS — N1831 Chronic kidney disease, stage 3a: Secondary | ICD-10-CM | POA: Diagnosis not present

## 2023-04-27 DIAGNOSIS — J449 Chronic obstructive pulmonary disease, unspecified: Secondary | ICD-10-CM | POA: Diagnosis not present

## 2023-04-27 DIAGNOSIS — Z87891 Personal history of nicotine dependence: Secondary | ICD-10-CM | POA: Diagnosis not present

## 2023-04-27 DIAGNOSIS — E1140 Type 2 diabetes mellitus with diabetic neuropathy, unspecified: Secondary | ICD-10-CM | POA: Diagnosis not present

## 2023-04-27 DIAGNOSIS — G35 Multiple sclerosis: Secondary | ICD-10-CM | POA: Diagnosis not present

## 2023-04-27 DIAGNOSIS — Q2730 Arteriovenous malformation, site unspecified: Secondary | ICD-10-CM | POA: Diagnosis not present

## 2023-04-27 DIAGNOSIS — E1122 Type 2 diabetes mellitus with diabetic chronic kidney disease: Secondary | ICD-10-CM | POA: Diagnosis not present

## 2023-04-27 DIAGNOSIS — F32A Depression, unspecified: Secondary | ICD-10-CM | POA: Diagnosis not present

## 2023-04-27 DIAGNOSIS — J9611 Chronic respiratory failure with hypoxia: Secondary | ICD-10-CM | POA: Diagnosis not present

## 2023-04-27 DIAGNOSIS — G894 Chronic pain syndrome: Secondary | ICD-10-CM | POA: Diagnosis not present

## 2023-04-27 DIAGNOSIS — Z9981 Dependence on supplemental oxygen: Secondary | ICD-10-CM | POA: Diagnosis not present

## 2023-04-27 DIAGNOSIS — E785 Hyperlipidemia, unspecified: Secondary | ICD-10-CM | POA: Diagnosis not present

## 2023-04-27 DIAGNOSIS — I272 Pulmonary hypertension, unspecified: Secondary | ICD-10-CM | POA: Diagnosis not present

## 2023-04-27 DIAGNOSIS — K7469 Other cirrhosis of liver: Secondary | ICD-10-CM | POA: Diagnosis not present

## 2023-04-29 DIAGNOSIS — H43813 Vitreous degeneration, bilateral: Secondary | ICD-10-CM | POA: Diagnosis not present

## 2023-04-29 DIAGNOSIS — E119 Type 2 diabetes mellitus without complications: Secondary | ICD-10-CM | POA: Diagnosis not present

## 2023-04-29 DIAGNOSIS — H2513 Age-related nuclear cataract, bilateral: Secondary | ICD-10-CM | POA: Diagnosis not present

## 2023-04-29 DIAGNOSIS — H40003 Preglaucoma, unspecified, bilateral: Secondary | ICD-10-CM | POA: Diagnosis not present

## 2023-04-29 LAB — HM DIABETES EYE EXAM

## 2023-05-02 ENCOUNTER — Other Ambulatory Visit: Payer: Self-pay | Admitting: Family Medicine

## 2023-05-02 ENCOUNTER — Inpatient Hospital Stay: Payer: Medicare Other

## 2023-05-02 VITALS — BP 152/57 | HR 77 | Temp 97.1°F | Resp 18

## 2023-05-02 DIAGNOSIS — D631 Anemia in chronic kidney disease: Secondary | ICD-10-CM

## 2023-05-02 DIAGNOSIS — E782 Mixed hyperlipidemia: Secondary | ICD-10-CM

## 2023-05-02 DIAGNOSIS — N1831 Chronic kidney disease, stage 3a: Secondary | ICD-10-CM | POA: Diagnosis not present

## 2023-05-02 DIAGNOSIS — E611 Iron deficiency: Secondary | ICD-10-CM | POA: Diagnosis not present

## 2023-05-02 DIAGNOSIS — F331 Major depressive disorder, recurrent, moderate: Secondary | ICD-10-CM

## 2023-05-02 DIAGNOSIS — D500 Iron deficiency anemia secondary to blood loss (chronic): Secondary | ICD-10-CM

## 2023-05-02 DIAGNOSIS — K219 Gastro-esophageal reflux disease without esophagitis: Secondary | ICD-10-CM

## 2023-05-02 MED ORDER — SODIUM CHLORIDE 0.9 % IV SOLN
Freq: Once | INTRAVENOUS | Status: AC
  Filled 2023-05-02: qty 250

## 2023-05-02 MED ORDER — SODIUM CHLORIDE 0.9 % IV SOLN
200.0000 mg | Freq: Once | INTRAVENOUS | Status: AC
  Administered 2023-05-02: 200 mg via INTRAVENOUS
  Filled 2023-05-02: qty 200

## 2023-05-02 NOTE — Progress Notes (Signed)
Pt has been educated and understands. Pt declined to stay 30 mins after iron infusion.

## 2023-05-03 ENCOUNTER — Ambulatory Visit (INDEPENDENT_AMBULATORY_CARE_PROVIDER_SITE_OTHER): Payer: Medicare Other | Admitting: Neurology

## 2023-05-03 ENCOUNTER — Encounter: Payer: Self-pay | Admitting: Neurology

## 2023-05-03 VITALS — BP 137/59 | HR 79 | Ht 70.0 in | Wt 196.0 lb

## 2023-05-03 DIAGNOSIS — G609 Hereditary and idiopathic neuropathy, unspecified: Secondary | ICD-10-CM | POA: Diagnosis not present

## 2023-05-03 DIAGNOSIS — G35 Multiple sclerosis: Secondary | ICD-10-CM | POA: Diagnosis not present

## 2023-05-03 DIAGNOSIS — R269 Unspecified abnormalities of gait and mobility: Secondary | ICD-10-CM | POA: Diagnosis not present

## 2023-05-03 NOTE — Progress Notes (Signed)
Patient: Jeremy [name] Date of Birth: Sep 22, 1951  Reason for Visit: Follow up History from: Patient, wife  Primary Neurologist: Dr. Terrace [name]  ASSESSMENT AND PLAN 72 y.o. year old male   1.  Relapsing remitting multiple sclerosis 2.  Gait abnormality 3.  Diabetic peripheral neuropathy  -Continues with generalized weakness, deconditioning, chronic anemia requiring iron and blood infusion  -Wishes to remain on Betaseron for MS. Reported worsening gait when discontinued in 2020, worries he would further decline without it. On patient assistance for Betaseron, wife will send me the paperwork when due -Seeing pain management now, refilling Lyrica, Tramadol  -Most recent MRI of the brain in 2018 showed multiple subcortical, periventricular nonenhancing lesion, no change compared to previous scan in 2015, no contrast-enhancement -Follow up in 1 year with Dr. Terrace [name] for MS update  HISTORY  He has PMHx of  diabetes, hypertension and multiple sclerosis.  The diagnosis of multiple sclerosis was confirmed by abnormal MRI brain and cervical, and a spinal fluid testing.  He was initially followed by Dr. Nash [name] in 2008. Last seen in this office Oct 2014, He continues to have gait difficulties and relies on a cane for ambulation.  He has had no problems with his vision or bowel/bladder control.   He has had occasional falls.He has ongoing pain in his legs. Last MS flare was over 2 years ago. He is currently on Betaseron every other day, treatment started in 2007.    MRI cervical in 2013 with findings consistent with MS plaques and no enhancement,mild spinal stenosis.   MRI of the brain with prominent white matter changes consistent with MS. No active areas of demyelination. He continues to have problems with low     The initial symptom was acute onset of left leg more than arm weakness, gait difficulty  in 2006.  This eventually led to the diagnosis of relapsing remitting multiple sclerosis. He has been  receiving Betaseron treatment since early 2007.  He has tolerated the medication well.  He has had no problems with lipoatrophy or skin necrosis. In September 2009, he developed GI bleeding.  Workup had demonstrated colon inflammation and obstruction.He underwent colectomy and recovered well from that procedure.   He continues to have gait difficulties and relies on a cane for ambulation.  He has had no problems with his vision or bowel/bladder control. He does have chronic extremity pain, which has been managed with Lyrica and Tramadol.    He uses an Art gallery manager for situations in which he would need to walk long distances. He has had occasional falls.He has ongoing pain in his legs   UPDATE 07/2014: He was admitted to hospital in Nov 2014 for anemia, low blood count, was evaluated by hematologist in [facility], now has recovered, he still has moderate gait difficulty.also complains bilateral lower extremity spasticity, is taking Lyrica, there was no clinical flareups, last MRI was in 2013, there was significant lesions in brain, and cervical spine    UPDATE 09/03/2014: He has tried Baclofen 10mg  qhs, complains of excessive sleepiness, could not tolerate it, he still mows the yard, the most limitation is his gait difficulty,he denies bowel and bladder incontinence.   We have reviewed MRI togather, MRI scan of brain showing periventricular and subcortical white matter hyperintensities consistent with multiple sclerosis. Presence of atrophy of corpus callosum indicates chronic disease. No significant change compared with MRI 06/02/2012    MRI cervical spine showing mild spondylitic changes descibed above most prominent at C 3-4 where there is  moderate left formaminal narrowing. Ill defined spinal cord hypertintensities at C 3-4 to c 6-7 likely represent chronic multiple sclerosis inactive plaques. No significant change compared with MRI C spine 06/02/2012   UPDATE 11//17/16 [name], 72 year old male  returns for follow-up. He was last seen in this office 03/06/15.  He was diagnosed with multiple sclerosis in 2006. He is currently on Betaseron every other day without injection site issues.. He still remains fairly active mowing the yard, denies any bowel or bladder incontinence. He denies any sensory changes,  double vision speech or swallowing difficulty. He ambulates with a cane. He has had a couple of falls since last seen. He returns for reevaluation   UPDATE May 17th 2017:YY He is with his wife at today's clinical visit, he continued to receive Betaseron every other day without significant side effect, he has significant gait difficulty, left side is weaker, complains of chronic fatigue, chronic low back pain, bilateral lower extremity neuropathic pain, taking Lyrica, tramadol as needed,  Reviewed laboratory evaluation, A1c 7.9, CMP showed elevated creatinine 1.4, normal CBC  We again personally reviewed MRI of the scan without contrast, most recent was in 2015, MRI of the brain showed multiple supratentorium lesions, no contrast enhancement, MRI of cervical spine showed ill-defined C3-C7 cord lesion, no contrast enhancement, no significant change compared to previous scans   Update September 22, 2017:YY Creatinine 1.27 in July 2018, he is overall doing very well, mild gait abnormality, use cane, still active at home, providing normal, no bowel bladder incontinence, diabetes, diabetic peripheral neuropathy, using Betaseron, last MRI was in 2015,   UPDATE May 01 2019: He is overall stable, continue ambulate with a cane, slow worsening gait abnormality, heat intolerance, I personally reviewed MRI of the brain with without contrast in December 2018: Multiple subcortical, periventricular nonenhancing lesion, no significant change compared to previous scan in October 2015   Laboratory evaluations in September 2019: Hg 12.4,  LDL 84, A1C 5.7   UPDATE May 06 2020: He is accompanied by his wife at  today's clinic visit, he is overall stable, only slow as expected decline with aging, rely on his cane more, denies bowel and bladder incontinence, he tolerated Betaseron well, does not want to change   We reviewed MRI of the brain with without contrast together, most recently in December 2018, multiple supratentorial MS lesions, no change compared to previous scan in October 2015, T1 black holes, no contrast-enhancement   Laboratory evaluation in March 2021: CBC showed anemia hemoglobin of 10.9, CMP abnormal creatinine 1.37, lipid panel, LDL 68   UPDATE May 07 2021: He is accompanied by his wife at today's visit, suffered COVID in January 2022, severely symptomatic, following that, he has worsening shortness of breath, previous smoker, was seen by pulmonologist, suspected pulmonary hypertension, oxygen desaturation, questionable residual effects from COVID, he was started on portable oxygen at 2 L, gave inhaler  Patient complains of worsening functional status since then, generalized fatigue, lack of stamina, still to ambulate with walker   Laboratory evaluation in June 2022, A1c 4.9, TSH 1.95, CMP creat 1.31, LDL 69, Hg. 9.8  Update May 04, 2022 SS: Here with his wife, today is his 71th birthday. Now has oxygen continuously. Gets fatigued easily, balance is poor. Has transport wheelchair. Remains on Betaseron, gets drug company assistance. Hospitalized in Dec, was getting iron infusion, HBG was 5. PNA in April. Takes Lyrica 200 mg twice daily, Tramadol PRN. Has cane, gets SOB, mostly uses rollator. Rarely drives a car.  Remains on Lyrica for neuropathy, reports PCP mentioned will no longer refill.   Update October 05, 2022 SS: Continues to use Tramadol 4 tablets daily for chronic pain. Uses rolling walker short distances in the home. Wheelchair when he goes out. Uses Betaseron for MS. On continuous nasal cannula oxygen. Admitted October for weakness due to low BP? Generalized decline? Gets iron  infusion. HGB 8.3, creatinine 2.07. A1C 4.6, did blood sugar get too low? CT Head showed no acute abnormalities. Doing home PT.   Update May 03, 2023 SS: Last HGB 8.7, gets iron infusion and blood regularly, follows with GI for AVM, didn't want to do anymore sedating procedure. He is losing weight. Having labs tomorrow with hematology. Today is a bad day, weakness. Doing PT 1 day a week. Remains on Betaseron. Can stand pivot, few steps with walker. Needs assistance with ADLs. Pain controlled with Tramadol and Lyrica, mostly in feet legs with burning.   REVIEW OF SYSTEMS: Out of a complete 14 system review of symptoms, the patient complains only of the following symptoms, and all other reviewed systems are negative.  See HPI  ALLERGIES: Allergies  Allergen Reactions   Betadine [Povidone Iodine]     HOME MEDICATIONS: Outpatient Medications Prior to Visit  Medication Sig Dispense Refill   acetaminophen (TYLENOL) 500 MG tablet Take 500 mg by mouth every 6 (six) hours as needed for mild pain.     albuterol (VENTOLIN HFA) 108 (90 Base) MCG/ACT inhaler Inhale 2 puffs into the lungs in the morning, at noon, in the evening, and at bedtime.     B-D INSULIN SYRINGE 1CC/25GX1" 25G X 1" 1 ML MISC USE AS DIRECTED 10 each 2   cyanocobalamin (VITAMIN B12) 500 MCG tablet Take 1,000 mcg by mouth daily.     ferrous sulfate 325 (65 FE) MG tablet Take 325 mg by mouth daily with breakfast.     furosemide (LASIX) 40 MG tablet Take 40 mg by mouth. 1/2 pill every other day- Singh     gemfibrozil (LOPID) 600 MG tablet TAKE 1 TABLET BY MOUTH TWICE  DAILY BEFORE MEALS 180 tablet 0   insulin glargine (LANTUS) 100 UNIT/ML Solostar Pen Inject 20 Units into the skin at bedtime. (Patient taking differently: Inject 18 Units into the skin at bedtime.) 15 mL 11   Interferon Beta-1b (BETASERON) 0.3 MG KIT injection Inject subcutaneously 1 syringe every other day. 45 kit 4   loratadine (CLARITIN) 10 MG tablet Take 1 tablet (10  mg total) by mouth daily. 90 tablet 1   metFORMIN (GLUCOPHAGE) 500 MG tablet Take 500 mg by mouth 2 (two) times daily.     mupirocin ointment (BACTROBAN) 2 % Apply 1 Application topically 2 (two) times daily. Apply to effected area bid 30 g 1   omeprazole (PRILOSEC) 40 MG capsule TAKE 1 CAPSULE BY MOUTH DAILY 90 capsule 0   pregabalin (LYRICA) 200 MG capsule Take 1 capsule (200 mg total) by mouth 2 (two) times daily. 60 capsule 5   sertraline (ZOLOFT) 50 MG tablet TAKE ONE-HALF TABLET BY MOUTH  DAILY 45 tablet 0   simvastatin (ZOCOR) 40 MG tablet Take 1 tablet (40 mg total) by mouth daily. 90 tablet 1   sodium bicarbonate 650 MG tablet Take 650 mg by mouth 2 (two) times daily.     traMADol (ULTRAM) 50 MG tablet Take 1 tablet (50 mg total) by mouth every 6 (six) hours as needed. 120 tablet 2   Facility-Administered Medications Prior to Visit  Medication Dose Route Frequency Provider Last Rate Last Admin   0.9 %  sodium chloride infusion   Intravenous Continuous Rickard Patience, MD   Stopped at 11/04/22 1432   diphenhydrAMINE (BENADRYL) capsule 25 mg  25 mg Oral Once Rickard Patience, MD        PAST MEDICAL HISTORY: Past Medical History:  Diagnosis Date   Chronic pain    Depression    Diabetes (HCC)    GERD (gastroesophageal reflux disease)    GI bleed 12/2022   Hyperlipemia    Hypertension    MS (multiple sclerosis) (HCC)     PAST SURGICAL HISTORY: Past Surgical History:  Procedure Laterality Date   COLECTOMY  06-2008   COLONOSCOPY  2015   normal   COLONOSCOPY WITH PROPOFOL N/A 09/10/2021   Procedure: COLONOSCOPY WITH PROPOFOL;  Surgeon: Midge Minium, MD;  Location: [facility] ENDOSCOPY;  Service: Endoscopy;  Laterality: N/A;   ESOPHAGOGASTRODUODENOSCOPY (EGD) WITH PROPOFOL N/A 09/10/2021   Procedure: ESOPHAGOGASTRODUODENOSCOPY (EGD) WITH PROPOFOL;  Surgeon: Midge Minium, MD;  Location: [facility] ENDOSCOPY;  Service: Endoscopy;  Laterality: N/A;   GIVENS CAPSULE STUDY N/A 09/25/2021   Procedure: GIVENS  CAPSULE STUDY;  Surgeon: Wyline Mood, MD;  Location: [facility] ENDOSCOPY;  Service: Gastroenterology;  Laterality: N/A;    FAMILY HISTORY: Family History  Problem Relation Age of Onset   Lung cancer Mother    Heart attack Father    Heart attack Brother    COPD Brother    Diabetes Brother    Esophageal cancer Nephew     SOCIAL HISTORY: Social History   Socioeconomic History   Marital status: Married    Spouse name: Olegario Messier   Number of children: 2   Years of education: GED   Highest education level: Not on file  Occupational History    Comment: Disabled  Tobacco Use   Smoking status: Former    Packs/day: 1.50    Years: 30.00    Additional pack years: 0.00    Total pack years: 45.00    Types: Cigarettes    Quit date: 01/23/2006    Years since quitting: 17.2   Smokeless tobacco: Never   Tobacco comments:    N/A  Vaping Use   Vaping Use: Never used  Substance and Sexual Activity   Alcohol use: Not Currently    Comment: rare; maybe 2 beers a year   Drug use: No   Sexual activity: Not Currently  Other Topics Concern   Not on file  Social History Narrative   Patient is disabled.    Patient lives with his wife Asmir Lauersdorf.    Patient has 2 children.       Social Determinants of Health   Financial Resource Strain: Low Risk  (11/19/2022)   Overall Financial Resource Strain (CARDIA)    Difficulty of Paying Living Expenses: Not hard at all  Food Insecurity: No Food Insecurity (12/14/2022)   Hunger Vital Sign    Worried About Running Out of Food in the Last Year: Never true    Ran Out of Food in the Last Year: Never true  Transportation Needs: No Transportation Needs (12/14/2022)   PRAPARE - Administrator, Civil Service (Medical): No    Lack of Transportation (Non-Medical): No  Physical Activity: Insufficiently Active (11/19/2022)   Exercise Vital Sign    Days of Exercise per Week: 3 days    Minutes of Exercise per Session: 20 min  Stress: No Stress Concern  Present (11/19/2022)   [employer]  of Occupational Health - Occupational Stress Questionnaire    Feeling of Stress : Not at all  Social Connections: Moderately Isolated (11/19/2022)   Social Connection and Isolation Panel [NHANES]    Frequency of Communication with Friends and Family: More than three times a week    Frequency of Social Gatherings with Friends and Family: More than three times a week    Attends Religious Services: Never    Database administrator or Organizations: No    Attends Banker Meetings: Never    Marital Status: Married  Catering manager Violence: Not At Risk (11/25/2022)   Humiliation, Afraid, Rape, and Kick questionnaire    Fear of Current or Ex-Partner: No    Emotionally Abused: No    Physically Abused: No    Sexually Abused: No   PHYSICAL EXAM  Vitals:   05/03/23 1424  BP: (!) 137/59  Pulse: 79  Weight: 196 lb (88.9 kg)  Height: 5\' 10"  (1.778 m)   Body mass index is 28.12 kg/m.  Generalized: Well developed, in no acute distress, wearing continuous oxygen, frail, pale  Neurological examination  Mentation: Alert oriented to time, place, history taking. Follows all commands speech and language fluent, gets short of breath with prolonged talking, soft spoken  Cranial nerve II-XII: Pupils were equal round reactive to light. Extraocular movements were full, visual field were full on confrontational test. Facial sensation and strength were normal.  Head turning and shoulder shrug were normal and symmetric. Motor: Moderate generalized weakness bilateral lower extremities, increased spasticity lower extremities more on the left Sensory: Length dependent sensory deficit to soft touch to knee level Coordination: Cerebellar testing reveals good finger-nose-finger bilaterally, hard to perform heel to shin Gait and station: In wheelchair, was not ambulated today  DIAGNOSTIC DATA (LABS, IMAGING, TESTING) - I reviewed patient records, labs, notes,  testing and imaging myself where available.  Lab Results  Component Value Date   WBC 8.5 04/07/2023   HGB 8.7 (L) 04/07/2023   HCT 28.4 (L) 04/07/2023   MCV 92.8 04/07/2023   PLT 319 04/07/2023      Component Value Date/Time   NA 140 03/18/2023 1429   NA 138 02/15/2022 1416   NA 143 08/31/2013 0417   K 4.1 03/18/2023 1429   K 3.5 08/31/2013 0417   CL 107 03/18/2023 1429   CL 109 (H) 08/31/2013 0417   CO2 22 03/18/2023 1429   CO2 28 08/31/2013 0417   GLUCOSE 103 (H) 03/18/2023 1429   GLUCOSE 133 (H) 08/31/2013 0417   BUN 25 (H) 03/18/2023 1429   BUN 33 (H) 02/15/2022 1416   BUN 39 (H) 08/31/2013 0417   CREATININE 1.36 (H) 03/18/2023 1429   CREATININE 1.17 02/09/2023 1409   CREATININE 1.53 (H) 08/31/2013 0417   CALCIUM 8.7 (L) 03/18/2023 1429   CALCIUM 8.9 08/31/2013 0417   PROT 7.0 03/18/2023 1429   PROT 6.9 12/25/2020 1058   PROT 6.2 (L) 08/31/2013 0417   ALBUMIN 2.8 (L) 03/18/2023 1429   ALBUMIN 3.6 (L) 02/15/2022 1416   ALBUMIN 2.9 (L) 08/31/2013 0417   AST 29 03/18/2023 1429   AST 35 08/31/2013 0417   ALT 13 03/18/2023 1429   ALT 15 08/31/2013 0417   ALKPHOS 91 03/18/2023 1429   ALKPHOS 60 08/31/2013 0417   BILITOT 0.8 03/18/2023 1429   BILITOT <0.2 12/25/2020 1058   BILITOT 0.6 08/31/2013 0417   GFRNONAA 56 (L) 03/18/2023 1429   GFRNONAA >60 02/09/2023 1409   GFRNONAA 48 (L)  08/31/2013 0417   GFRAA 61 01/15/2020 0954   GFRAA 56 (L) 08/31/2013 0417   Lab Results  Component Value Date   CHOL 103 07/10/2021   HDL 22 (L) 07/10/2021   LDLCALC 35 07/10/2021   TRIG 228 (H) 07/10/2021   CHOLHDL 4.7 07/10/2021   Lab Results  Component Value Date   HGBA1C 4.4 (L) 11/24/2022   Lab Results  Component Value Date   VITAMINB12 1,525 (H) 06/14/2022   Lab Results  Component Value Date   TSH 1.90 03/30/2021   Margie Ege, AGNP-C, DNP 05/03/2023, 2:57 PM [facility] [address], [address] [place], [place] [address] [phone]

## 2023-05-04 MED FILL — Iron Sucrose Inj 20 MG/ML (Fe Equiv): INTRAVENOUS | Qty: 10 | Status: AC

## 2023-05-04 NOTE — Progress Notes (Signed)
Chart reviewed, agree above plan ?

## 2023-05-05 ENCOUNTER — Other Ambulatory Visit: Payer: Self-pay

## 2023-05-05 ENCOUNTER — Other Ambulatory Visit: Payer: Self-pay | Admitting: Oncology

## 2023-05-05 ENCOUNTER — Inpatient Hospital Stay: Payer: Medicare Other

## 2023-05-05 VITALS — BP 141/48

## 2023-05-05 DIAGNOSIS — E611 Iron deficiency: Secondary | ICD-10-CM | POA: Diagnosis not present

## 2023-05-05 DIAGNOSIS — D631 Anemia in chronic kidney disease: Secondary | ICD-10-CM

## 2023-05-05 DIAGNOSIS — D500 Iron deficiency anemia secondary to blood loss (chronic): Secondary | ICD-10-CM

## 2023-05-05 DIAGNOSIS — D649 Anemia, unspecified: Secondary | ICD-10-CM

## 2023-05-05 DIAGNOSIS — N1831 Chronic kidney disease, stage 3a: Secondary | ICD-10-CM | POA: Diagnosis not present

## 2023-05-05 LAB — CBC WITH DIFFERENTIAL (CANCER CENTER ONLY)
Abs Immature Granulocytes: 0.05 10*3/uL (ref 0.00–0.07)
Basophils Absolute: 0.1 10*3/uL (ref 0.0–0.1)
Basophils Relative: 1 %
Eosinophils Absolute: 0.5 10*3/uL (ref 0.0–0.5)
Eosinophils Relative: 7 %
HCT: 26 % — ABNORMAL LOW (ref 39.0–52.0)
Hemoglobin: 7.6 g/dL — ABNORMAL LOW (ref 13.0–17.0)
Immature Granulocytes: 1 %
Lymphocytes Relative: 13 %
Lymphs Abs: 1 10*3/uL (ref 0.7–4.0)
MCH: 28.4 pg (ref 26.0–34.0)
MCHC: 29.2 g/dL — ABNORMAL LOW (ref 30.0–36.0)
MCV: 97 fL (ref 80.0–100.0)
Monocytes Absolute: 0.5 10*3/uL (ref 0.1–1.0)
Monocytes Relative: 7 %
Neutro Abs: 5.3 10*3/uL (ref 1.7–7.7)
Neutrophils Relative %: 71 %
Platelet Count: 278 10*3/uL (ref 150–400)
RBC: 2.68 MIL/uL — ABNORMAL LOW (ref 4.22–5.81)
RDW: 22.3 % — ABNORMAL HIGH (ref 11.5–15.5)
WBC Count: 7.5 10*3/uL (ref 4.0–10.5)
nRBC: 0 % (ref 0.0–0.2)

## 2023-05-05 LAB — IRON AND TIBC
Iron: 31 ug/dL — ABNORMAL LOW (ref 45–182)
Saturation Ratios: 10 % — ABNORMAL LOW (ref 17.9–39.5)
TIBC: 325 ug/dL (ref 250–450)
UIBC: 294 ug/dL

## 2023-05-05 LAB — RETIC PANEL
Immature Retic Fract: 34.5 % — ABNORMAL HIGH (ref 2.3–15.9)
RBC.: 2.62 MIL/uL — ABNORMAL LOW (ref 4.22–5.81)
Retic Count, Absolute: 155.9 10*3/uL (ref 19.0–186.0)
Retic Ct Pct: 6 % — ABNORMAL HIGH (ref 0.4–3.1)
Reticulocyte Hemoglobin: 32 pg (ref >27.9)

## 2023-05-05 LAB — FERRITIN: Ferritin: 160 ng/mL (ref 24–336)

## 2023-05-05 MED ORDER — EPOETIN ALFA-EPBX 40000 UNIT/ML IJ SOLN
40000.0000 [IU] | Freq: Once | INTRAMUSCULAR | Status: AC
  Administered 2023-05-05: 40000 [IU] via SUBCUTANEOUS
  Filled 2023-05-05: qty 1

## 2023-05-06 ENCOUNTER — Other Ambulatory Visit: Payer: Self-pay

## 2023-05-06 ENCOUNTER — Inpatient Hospital Stay: Payer: Medicare Other

## 2023-05-06 ENCOUNTER — Emergency Department: Payer: Medicare Other

## 2023-05-06 ENCOUNTER — Inpatient Hospital Stay
Admission: EM | Admit: 2023-05-06 | Discharge: 2023-05-20 | DRG: 391 | Disposition: A | Discharge status: Home Health | Payer: Medicare Other | Attending: Internal Medicine | Admitting: Internal Medicine

## 2023-05-06 DIAGNOSIS — Z79899 Other long term (current) drug therapy: Secondary | ICD-10-CM | POA: Diagnosis not present

## 2023-05-06 DIAGNOSIS — D649 Anemia, unspecified: Secondary | ICD-10-CM | POA: Diagnosis not present

## 2023-05-06 DIAGNOSIS — J9621 Acute and chronic respiratory failure with hypoxia: Secondary | ICD-10-CM | POA: Diagnosis present

## 2023-05-06 DIAGNOSIS — D631 Anemia in chronic kidney disease: Secondary | ICD-10-CM | POA: Diagnosis present

## 2023-05-06 DIAGNOSIS — D500 Iron deficiency anemia secondary to blood loss (chronic): Secondary | ICD-10-CM | POA: Diagnosis present

## 2023-05-06 DIAGNOSIS — N189 Chronic kidney disease, unspecified: Secondary | ICD-10-CM

## 2023-05-06 DIAGNOSIS — J90 Pleural effusion, not elsewhere classified: Secondary | ICD-10-CM | POA: Diagnosis not present

## 2023-05-06 DIAGNOSIS — Z7401 Bed confinement status: Secondary | ICD-10-CM | POA: Diagnosis not present

## 2023-05-06 DIAGNOSIS — I130 Hypertensive heart and chronic kidney disease with heart failure and stage 1 through stage 4 chronic kidney disease, or unspecified chronic kidney disease: Secondary | ICD-10-CM | POA: Diagnosis present

## 2023-05-06 DIAGNOSIS — E785 Hyperlipidemia, unspecified: Secondary | ICD-10-CM | POA: Diagnosis present

## 2023-05-06 DIAGNOSIS — E119 Type 2 diabetes mellitus without complications: Secondary | ICD-10-CM

## 2023-05-06 DIAGNOSIS — R339 Retention of urine, unspecified: Secondary | ICD-10-CM | POA: Diagnosis present

## 2023-05-06 DIAGNOSIS — J440 Chronic obstructive pulmonary disease with acute lower respiratory infection: Secondary | ICD-10-CM | POA: Diagnosis present

## 2023-05-06 DIAGNOSIS — K219 Gastro-esophageal reflux disease without esophagitis: Secondary | ICD-10-CM | POA: Diagnosis present

## 2023-05-06 DIAGNOSIS — K2970 Gastritis, unspecified, without bleeding: Secondary | ICD-10-CM | POA: Diagnosis not present

## 2023-05-06 DIAGNOSIS — R188 Other ascites: Secondary | ICD-10-CM | POA: Diagnosis not present

## 2023-05-06 DIAGNOSIS — Z1152 Encounter for screening for COVID-19: Secondary | ICD-10-CM

## 2023-05-06 DIAGNOSIS — J189 Pneumonia, unspecified organism: Secondary | ICD-10-CM | POA: Diagnosis present

## 2023-05-06 DIAGNOSIS — K7460 Unspecified cirrhosis of liver: Secondary | ICD-10-CM | POA: Diagnosis not present

## 2023-05-06 DIAGNOSIS — R531 Weakness: Secondary | ICD-10-CM

## 2023-05-06 DIAGNOSIS — I5033 Acute on chronic diastolic (congestive) heart failure: Secondary | ICD-10-CM

## 2023-05-06 DIAGNOSIS — I5A Non-ischemic myocardial injury (non-traumatic): Secondary | ICD-10-CM | POA: Insufficient documentation

## 2023-05-06 DIAGNOSIS — K922 Gastrointestinal hemorrhage, unspecified: Secondary | ICD-10-CM | POA: Diagnosis not present

## 2023-05-06 DIAGNOSIS — E1122 Type 2 diabetes mellitus with diabetic chronic kidney disease: Secondary | ICD-10-CM | POA: Diagnosis present

## 2023-05-06 DIAGNOSIS — Z8249 Family history of ischemic heart disease and other diseases of the circulatory system: Secondary | ICD-10-CM | POA: Diagnosis not present

## 2023-05-06 DIAGNOSIS — N1831 Chronic kidney disease, stage 3a: Secondary | ICD-10-CM | POA: Diagnosis present

## 2023-05-06 DIAGNOSIS — Z87891 Personal history of nicotine dependence: Secondary | ICD-10-CM

## 2023-05-06 DIAGNOSIS — D509 Iron deficiency anemia, unspecified: Secondary | ICD-10-CM | POA: Diagnosis not present

## 2023-05-06 DIAGNOSIS — I270 Primary pulmonary hypertension: Secondary | ICD-10-CM | POA: Diagnosis present

## 2023-05-06 DIAGNOSIS — Z794 Long term (current) use of insulin: Secondary | ICD-10-CM

## 2023-05-06 DIAGNOSIS — K8020 Calculus of gallbladder without cholecystitis without obstruction: Secondary | ICD-10-CM | POA: Diagnosis not present

## 2023-05-06 DIAGNOSIS — Z825 Family history of asthma and other chronic lower respiratory diseases: Secondary | ICD-10-CM

## 2023-05-06 DIAGNOSIS — K31819 Angiodysplasia of stomach and duodenum without bleeding: Secondary | ICD-10-CM | POA: Diagnosis not present

## 2023-05-06 DIAGNOSIS — Q2730 Arteriovenous malformation, site unspecified: Secondary | ICD-10-CM | POA: Diagnosis not present

## 2023-05-06 DIAGNOSIS — R0602 Shortness of breath: Secondary | ICD-10-CM | POA: Diagnosis not present

## 2023-05-06 DIAGNOSIS — J9811 Atelectasis: Secondary | ICD-10-CM | POA: Diagnosis not present

## 2023-05-06 DIAGNOSIS — I129 Hypertensive chronic kidney disease with stage 1 through stage 4 chronic kidney disease, or unspecified chronic kidney disease: Secondary | ICD-10-CM | POA: Diagnosis not present

## 2023-05-06 DIAGNOSIS — G35 Multiple sclerosis: Secondary | ICD-10-CM | POA: Diagnosis present

## 2023-05-06 DIAGNOSIS — R918 Other nonspecific abnormal finding of lung field: Secondary | ICD-10-CM | POA: Diagnosis not present

## 2023-05-06 DIAGNOSIS — N5089 Other specified disorders of the male genital organs: Secondary | ICD-10-CM | POA: Diagnosis present

## 2023-05-06 DIAGNOSIS — N182 Chronic kidney disease, stage 2 (mild): Secondary | ICD-10-CM | POA: Insufficient documentation

## 2023-05-06 DIAGNOSIS — R7989 Other specified abnormal findings of blood chemistry: Secondary | ICD-10-CM | POA: Insufficient documentation

## 2023-05-06 DIAGNOSIS — I517 Cardiomegaly: Secondary | ICD-10-CM | POA: Diagnosis not present

## 2023-05-06 DIAGNOSIS — I959 Hypotension, unspecified: Secondary | ICD-10-CM | POA: Diagnosis not present

## 2023-05-06 DIAGNOSIS — J811 Chronic pulmonary edema: Secondary | ICD-10-CM | POA: Diagnosis not present

## 2023-05-06 DIAGNOSIS — N4889 Other specified disorders of penis: Secondary | ICD-10-CM | POA: Diagnosis present

## 2023-05-06 DIAGNOSIS — R0902 Hypoxemia: Secondary | ICD-10-CM | POA: Diagnosis not present

## 2023-05-06 DIAGNOSIS — Z833 Family history of diabetes mellitus: Secondary | ICD-10-CM

## 2023-05-06 DIAGNOSIS — F32A Depression, unspecified: Secondary | ICD-10-CM | POA: Diagnosis not present

## 2023-05-06 DIAGNOSIS — Z888 Allergy status to other drugs, medicaments and biological substances status: Secondary | ICD-10-CM

## 2023-05-06 DIAGNOSIS — G35D Multiple sclerosis, unspecified: Secondary | ICD-10-CM | POA: Diagnosis present

## 2023-05-06 DIAGNOSIS — Z7984 Long term (current) use of oral hypoglycemic drugs: Secondary | ICD-10-CM

## 2023-05-06 DIAGNOSIS — I10 Essential (primary) hypertension: Secondary | ICD-10-CM | POA: Diagnosis not present

## 2023-05-06 DIAGNOSIS — J449 Chronic obstructive pulmonary disease, unspecified: Secondary | ICD-10-CM | POA: Diagnosis present

## 2023-05-06 DIAGNOSIS — J984 Other disorders of lung: Secondary | ICD-10-CM | POA: Diagnosis not present

## 2023-05-06 DIAGNOSIS — J439 Emphysema, unspecified: Secondary | ICD-10-CM | POA: Diagnosis not present

## 2023-05-06 DIAGNOSIS — I272 Pulmonary hypertension, unspecified: Secondary | ICD-10-CM | POA: Insufficient documentation

## 2023-05-06 DIAGNOSIS — I5031 Acute diastolic (congestive) heart failure: Secondary | ICD-10-CM | POA: Diagnosis not present

## 2023-05-06 DIAGNOSIS — I491 Atrial premature depolarization: Secondary | ICD-10-CM | POA: Diagnosis not present

## 2023-05-06 DIAGNOSIS — K2950 Unspecified chronic gastritis without bleeding: Secondary | ICD-10-CM | POA: Diagnosis present

## 2023-05-06 DIAGNOSIS — E1142 Type 2 diabetes mellitus with diabetic polyneuropathy: Secondary | ICD-10-CM

## 2023-05-06 DIAGNOSIS — R0689 Other abnormalities of breathing: Secondary | ICD-10-CM | POA: Diagnosis not present

## 2023-05-06 LAB — COMPREHENSIVE METABOLIC PANEL
ALT: 10 U/L (ref 0–44)
AST: 22 U/L (ref 15–41)
Albumin: 2.3 g/dL — ABNORMAL LOW (ref 3.5–5.0)
Alkaline Phosphatase: 76 U/L (ref 38–126)
Anion gap: 6 (ref 5–15)
BUN: 32 mg/dL — ABNORMAL HIGH (ref 8–23)
CO2: 23 mmol/L (ref 22–32)
Calcium: 7.9 mg/dL — ABNORMAL LOW (ref 8.9–10.3)
Chloride: 111 mmol/L (ref 98–111)
Creatinine, Ser: 1.27 mg/dL — ABNORMAL HIGH (ref 0.61–1.24)
GFR, Estimated: >60 mL/min (ref >60)
Glucose, Bld: 112 mg/dL — ABNORMAL HIGH (ref 70–99)
Potassium: 4 mmol/L (ref 3.5–5.1)
Sodium: 140 mmol/L (ref 135–145)
Total Bilirubin: 0.9 mg/dL (ref 0.3–1.2)
Total Protein: 5.9 g/dL — ABNORMAL LOW (ref 6.5–8.1)

## 2023-05-06 LAB — CBC
HCT: 21.8 % — ABNORMAL LOW (ref 39.0–52.0)
Hemoglobin: 6.7 g/dL — ABNORMAL LOW (ref 13.0–17.0)
MCH: 29.3 pg (ref 26.0–34.0)
MCHC: 30.7 g/dL (ref 30.0–36.0)
MCV: 95.2 fL (ref 80.0–100.0)
Platelets: 227 10*3/uL (ref 150–400)
RBC: 2.29 MIL/uL — ABNORMAL LOW (ref 4.22–5.81)
RDW: 22.9 % — ABNORMAL HIGH (ref 11.5–15.5)
WBC: 9.3 10*3/uL (ref 4.0–10.5)
nRBC: 0 % (ref 0.0–0.2)

## 2023-05-06 LAB — BPAM RBC
Blood Product Expiration Date: 202408102359
Blood Product Expiration Date: 202408102359
ISSUE DATE / TIME: 202407121418
Unit Type and Rh: 6200
Unit Type and Rh: 6200

## 2023-05-06 LAB — TYPE AND SCREEN
ABO/RH(D): A POS
Antibody Screen: NEGATIVE
Unit division: 0
Unit division: 0

## 2023-05-06 LAB — TROPONIN I (HIGH SENSITIVITY): Troponin I (High Sensitivity): 25 ng/L — ABNORMAL HIGH (ref <18)

## 2023-05-06 LAB — PROTIME-INR
INR: 1.3 — ABNORMAL HIGH (ref 0.8–1.2)
Prothrombin Time: 16.6 seconds — ABNORMAL HIGH (ref 11.4–15.2)

## 2023-05-06 LAB — HEMOGLOBIN AND HEMATOCRIT, BLOOD
HCT: 26 % — ABNORMAL LOW (ref 39.0–52.0)
Hemoglobin: 8 g/dL — ABNORMAL LOW (ref 13.0–17.0)

## 2023-05-06 LAB — CBG MONITORING, ED
Glucose-Capillary: 123 mg/dL — ABNORMAL HIGH (ref 70–99)
Glucose-Capillary: 135 mg/dL — ABNORMAL HIGH (ref 70–99)

## 2023-05-06 LAB — PREPARE RBC (CROSSMATCH)

## 2023-05-06 MED ORDER — SODIUM BICARBONATE 650 MG PO TABS
650.0000 mg | ORAL_TABLET | Freq: Two times a day (BID) | ORAL | Status: DC
  Administered 2023-05-06 – 2023-05-07 (×2): 650 mg via ORAL
  Filled 2023-05-06 (×4): qty 1

## 2023-05-06 MED ORDER — ACETAMINOPHEN 325 MG PO TABS
650.0000 mg | ORAL_TABLET | Freq: Four times a day (QID) | ORAL | Status: DC | PRN
  Administered 2023-05-06 – 2023-05-09 (×5): 650 mg via ORAL
  Filled 2023-05-06 (×5): qty 2

## 2023-05-06 MED ORDER — PREGABALIN 75 MG PO CAPS
200.0000 mg | ORAL_CAPSULE | Freq: Two times a day (BID) | ORAL | Status: DC
  Administered 2023-05-06 – 2023-05-08 (×3): 200 mg via ORAL
  Filled 2023-05-06: qty 4
  Filled 2023-05-06 (×2): qty 1

## 2023-05-06 MED ORDER — IPRATROPIUM-ALBUTEROL 0.5-2.5 (3) MG/3ML IN SOLN
3.0000 mL | Freq: Four times a day (QID) | RESPIRATORY_TRACT | Status: DC | PRN
  Administered 2023-05-08: 3 mL via RESPIRATORY_TRACT

## 2023-05-06 MED ORDER — ALBUTEROL SULFATE HFA 108 (90 BASE) MCG/ACT IN AERS: 2.0000 | INHALATION_SPRAY | Freq: Four times a day (QID) | RESPIRATORY_TRACT | Status: DC | PRN

## 2023-05-06 MED ORDER — VITAMIN B-12 1000 MCG PO TABS
1000.0000 ug | ORAL_TABLET | Freq: Every day | ORAL | Status: DC
  Administered 2023-05-08 – 2023-05-20 (×13): 1000 ug via ORAL
  Filled 2023-05-06 (×13): qty 1

## 2023-05-06 MED ORDER — TRAMADOL HCL 50 MG PO TABS
50.0000 mg | ORAL_TABLET | Freq: Four times a day (QID) | ORAL | Status: DC | PRN
  Administered 2023-05-06 – 2023-05-20 (×30): 50 mg via ORAL
  Filled 2023-05-06 (×30): qty 1

## 2023-05-06 MED ORDER — HYDRALAZINE HCL 20 MG/ML IJ SOLN
5.0000 mg | Freq: Three times a day (TID) | INTRAMUSCULAR | Status: DC | PRN
  Filled 2023-05-06: qty 1

## 2023-05-06 MED ORDER — ACETAMINOPHEN 650 MG RE SUPP: 650.0000 mg | Freq: Four times a day (QID) | RECTAL | Status: DC | PRN

## 2023-05-06 MED ORDER — INSULIN ASPART 100 UNIT/ML IJ SOLN
0.0000 [IU] | Freq: Every day | INTRAMUSCULAR | Status: DC
  Administered 2023-05-14 – 2023-05-19 (×3): 2 [IU] via SUBCUTANEOUS
  Filled 2023-05-06 (×3): qty 1

## 2023-05-06 MED ORDER — ONDANSETRON HCL 4 MG/2ML IJ SOLN: 4.0000 mg | Freq: Four times a day (QID) | INTRAMUSCULAR | Status: DC | PRN

## 2023-05-06 MED ORDER — PANTOPRAZOLE 80MG IVPB - SIMPLE MED
80.0000 mg | Freq: Two times a day (BID) | INTRAVENOUS | Status: DC
  Administered 2023-05-06 – 2023-05-08 (×3): 80 mg via INTRAVENOUS
  Filled 2023-05-06 (×6): qty 100

## 2023-05-06 MED ORDER — SIMVASTATIN 20 MG PO TABS
40.0000 mg | ORAL_TABLET | Freq: Every day | ORAL | Status: DC
  Administered 2023-05-06 – 2023-05-19 (×14): 40 mg via ORAL
  Filled 2023-05-06 (×8): qty 2
  Filled 2023-05-06: qty 4
  Filled 2023-05-06 (×5): qty 2

## 2023-05-06 MED ORDER — INSULIN ASPART 100 UNIT/ML IJ SOLN
0.0000 [IU] | Freq: Three times a day (TID) | INTRAMUSCULAR | Status: DC
  Administered 2023-05-06: 1 [IU] via SUBCUTANEOUS
  Administered 2023-05-07: 3 [IU] via SUBCUTANEOUS
  Administered 2023-05-08 – 2023-05-11 (×3): 2 [IU] via SUBCUTANEOUS
  Administered 2023-05-12: 3 [IU] via SUBCUTANEOUS
  Administered 2023-05-12 – 2023-05-13 (×3): 1 [IU] via SUBCUTANEOUS
  Administered 2023-05-14: 2 [IU] via SUBCUTANEOUS
  Administered 2023-05-15 (×2): 1 [IU] via SUBCUTANEOUS
  Administered 2023-05-16: 2 [IU] via SUBCUTANEOUS
  Administered 2023-05-16 – 2023-05-17 (×2): 1 [IU] via SUBCUTANEOUS
  Administered 2023-05-18: 2 [IU] via SUBCUTANEOUS
  Administered 2023-05-18 (×2): 1 [IU] via SUBCUTANEOUS
  Administered 2023-05-19: 2 [IU] via SUBCUTANEOUS
  Administered 2023-05-19: 1 [IU] via SUBCUTANEOUS
  Administered 2023-05-20: 2 [IU] via SUBCUTANEOUS
  Filled 2023-05-06 (×15): qty 1

## 2023-05-06 MED ORDER — ALBUTEROL SULFATE (2.5 MG/3ML) 0.083% IN NEBU: 2.5000 mg | INHALATION_SOLUTION | Freq: Four times a day (QID) | RESPIRATORY_TRACT | Status: DC | PRN

## 2023-05-06 MED ORDER — SODIUM CHLORIDE 0.9 % IV SOLN
10.0000 mL/h | Freq: Once | INTRAVENOUS | Status: AC
  Administered 2023-05-06: 10 mL/h via INTRAVENOUS

## 2023-05-06 MED ORDER — ONDANSETRON HCL 4 MG PO TABS: 4.0000 mg | ORAL_TABLET | Freq: Four times a day (QID) | ORAL | Status: DC | PRN

## 2023-05-06 MED ORDER — SERTRALINE HCL 50 MG PO TABS
25.0000 mg | ORAL_TABLET | Freq: Every day | ORAL | Status: DC
  Administered 2023-05-08 – 2023-05-20 (×13): 25 mg via ORAL
  Filled 2023-05-06 (×13): qty 1

## 2023-05-06 MED ORDER — SENNOSIDES-DOCUSATE SODIUM 8.6-50 MG PO TABS: 1.0000 | ORAL_TABLET | Freq: Every evening | ORAL | Status: DC | PRN

## 2023-05-06 NOTE — Assessment & Plan Note (Addendum)
Last hemoglobin 8.6, last ferritin 160.  Received 2 units of packed red blood cells during hospital course.

## 2023-05-06 NOTE — Assessment & Plan Note (Addendum)
Patient blood pressure is controlled and low normotensive on admission, home losartan 25 mg daily not resumed on admission; furosemide 40 mg, half a pill every other day not resumed on admission Hydralazine 5 mg IV every 8 hours as needed for SBP greater 175, 5 days ordered

## 2023-05-06 NOTE — ED Notes (Signed)
Report given to Joy RN at this time. I retrieved the unit of blood from the blood bank and handed it off to Joy RN to start.

## 2023-05-06 NOTE — Consult Note (Addendum)
Hematology/Oncology Consult note [phone] [phone]      Patient Care Team: Duanne Limerick, MD as PCP - General (Family Medicine) Levert Feinstein, MD as Consulting Physician (Neurology) Gwyneth Revels, DPM as Consulting Physician (Podiatry) Sherlon Handing, MD as Consulting Physician (Internal Medicine) Jasper Riling, MD as Consulting Physician (Nephrology) Rickard Patience, MD as Consulting Physician (Oncology) Kemper Durie, RN as [facility] Care Management   Name of the patient: Jeremy [name]  [id number]  01/14/1951   REASON FOR COSULTATION:   History of presenting illness-  72 y.o. male with PMH listed at below who presents to ER for evaluation of weakness.  Patient has a history of AVM, anemia in chronic kidney disease, iron deficiency anemia due to chronic blood loss.  He has had received multiple doses of Venofer outpatient.  5 doses since beginning of June 2024 and erythropoietin replacement therapy.  Last erythropoietin therapy Retacrit on 05/05/2023. His hemoglobin continues to progressively get worse. 05/05/2023, hemoglobin 7.6, decreased from 1 month ago 8.7. He was recommended to come to the cancer center today to get 1 unit of PRBC transfusion. However, wife found patient extremely weak today so he came to emergency room.  Hemoglobin today showed further decrease of hemoglobin to 6.7. Wife reports dark color stool for sometime. Patient denies any abdominal pain. Patient is admitted for further evaluation.  GI has been consulted.  Allergies  Allergen Reactions   Betadine [Povidone Iodine]     Patient Active Problem List   Diagnosis Date Noted   Iron deficiency anemia due to chronic blood loss 06/19/2021    Priority: High   Arteriovenous malformation (AVM) 10/21/2022    Priority: Medium    Anemia in chronic kidney disease (CKD) 06/19/2021    Priority: Medium    Symptomatic anemia 05/06/2023   Elevated troponin 05/06/2023    GI bleed 11/24/2022   Weakness 08/18/2022   AKI (acute kidney injury) (HCC) 08/18/2022   Severe sepsis (HCC) 02/04/2022   Pneumonia 02/03/2022   Acute on chronic respiratory failure with hypoxia (HCC) 02/03/2022   Hyperkalemia 02/03/2022   COPD (chronic obstructive pulmonary disease) (HCC) 02/03/2022   SIRS (systemic inflammatory response syndrome), possible sepsis (HCC) 02/03/2022   Anemia due to blood loss 09/24/2021   Gastric polyp    Gastritis without bleeding    Gait abnormality 05/07/2021   Stage 3a chronic kidney disease (HCC) 05/20/2020   DM type 2 with diabetic peripheral neuropathy (HCC) 05/06/2020   Idiopathic peripheral neuropathy 01/15/2020   Generalized edema 01/15/2020   Primary pulmonary hypertension (HCC) 10/28/2019   Therapeutic drug monitoring 03/23/2018   Mild episode of recurrent major depressive disorder (HCC) 09/26/2017   Ventricular ectopic beats 09/26/2017   Type 2 diabetes mellitus with diabetic polyneuropathy, with long-term current use of insulin (HCC) 08/12/2017   Hyperlipidemia due to type 2 diabetes mellitus (HCC) 08/12/2017   B12 deficiency 12/14/2016   Low back pain 03/10/2016   Lumbosacral disc disease 01/01/2016   Neuropathy associated with endocrine disorder (HCC) 11/19/2015   Iron deficiency anemia 06/03/2015   Essential hypertension 06/03/2015   Hyperlipidemia 06/03/2015   Depression 06/03/2015   Gastroesophageal reflux disease without esophagitis 06/03/2015   Edema extremities 06/03/2015   Multiple sclerosis (HCC) 07/26/2013   Abnormality of gait 07/26/2013   Morbid obesity (HCC) 07/26/2013     Past Medical History:  Diagnosis Date   Chronic pain    Depression    Diabetes (HCC)    GERD (gastroesophageal reflux disease)  GI bleed 12/2022   Hyperlipemia    Hypertension    MS (multiple sclerosis) (HCC)      Past Surgical History:  Procedure Laterality Date   COLECTOMY  06-2008   COLONOSCOPY  2015   normal   COLONOSCOPY  WITH PROPOFOL N/A 09/10/2021   Procedure: COLONOSCOPY WITH PROPOFOL;  Surgeon: Midge Minium, MD;  Location: [facility] ENDOSCOPY;  Service: Endoscopy;  Laterality: N/A;   ESOPHAGOGASTRODUODENOSCOPY (EGD) WITH PROPOFOL N/A 09/10/2021   Procedure: ESOPHAGOGASTRODUODENOSCOPY (EGD) WITH PROPOFOL;  Surgeon: Midge Minium, MD;  Location: [facility] ENDOSCOPY;  Service: Endoscopy;  Laterality: N/A;   GIVENS CAPSULE STUDY N/A 09/25/2021   Procedure: GIVENS CAPSULE STUDY;  Surgeon: Wyline Mood, MD;  Location: [facility] ENDOSCOPY;  Service: Gastroenterology;  Laterality: N/A;    Social History   Socioeconomic History   Marital status: Married    Spouse name: Olegario Messier   Number of children: 2   Years of education: GED   Highest education level: Not on file  Occupational History    Comment: Disabled  Tobacco Use   Smoking status: Former    Current packs/day: 0.00    Average packs/day: 1.5 packs/day for 30.0 years (45.0 ttl pk-yrs)    Types: Cigarettes    Start date: 01/24/1976    Quit date: 01/23/2006    Years since quitting: 17.2   Smokeless tobacco: Never   Tobacco comments:    N/A  Vaping Use   Vaping status: Never Used  Substance and Sexual Activity   Alcohol use: Not Currently    Comment: rare; maybe 2 beers a year   Drug use: No   Sexual activity: Not Currently  Other Topics Concern   Not on file  Social History Narrative   Patient is disabled.    Patient lives with his wife Tyger Hawkes.    Patient has 2 children.       Social Determinants of Health   Financial Resource Strain: Low Risk  (11/19/2022)   Overall Financial Resource Strain (CARDIA)    Difficulty of Paying Living Expenses: Not hard at all  Food Insecurity: No Food Insecurity (12/14/2022)   Hunger Vital Sign    Worried About Running Out of Food in the Last Year: Never true    Ran Out of Food in the Last Year: Never true  Transportation Needs: No Transportation Needs (12/14/2022)   PRAPARE - Administrator, Civil Service  (Medical): No    Lack of Transportation (Non-Medical): No  Physical Activity: Insufficiently Active (11/19/2022)   Exercise Vital Sign    Days of Exercise per Week: 3 days    Minutes of Exercise per Session: 20 min  Stress: No Stress Concern Present (11/19/2022)   [employer] of Occupational Health - Occupational Stress Questionnaire    Feeling of Stress : Not at all  Social Connections: Moderately Isolated (11/19/2022)   Social Connection and Isolation Panel [NHANES]    Frequency of Communication with Friends and Family: More than three times a week    Frequency of Social Gatherings with Friends and Family: More than three times a week    Attends Religious Services: Never    Database administrator or Organizations: No    Attends Banker Meetings: Never    Marital Status: Married  Catering manager Violence: Not At Risk (11/25/2022)   Humiliation, Afraid, Rape, and Kick questionnaire    Fear of Current or Ex-Partner: No    Emotionally Abused: No    Physically Abused: No  Sexually Abused: No     Family History  Problem Relation Age of Onset   Lung cancer Mother    Heart attack Father    Heart attack Brother    COPD Brother    Diabetes Brother    Esophageal cancer Nephew      Current Facility-Administered Medications:    0.9 %  sodium chloride infusion, 10 mL/hr, Intravenous, Once, Cox, Amy N, DO   acetaminophen (TYLENOL) tablet 650 mg, 650 mg, Oral, Q6H PRN **OR** acetaminophen (TYLENOL) suppository 650 mg, 650 mg, Rectal, Q6H PRN, Cox, Amy N, DO   hydrALAZINE (APRESOLINE) injection 5 mg, 5 mg, Intravenous, Q8H PRN, Cox, Amy N, DO   insulin aspart (novoLOG) injection 0-5 Units, 0-5 Units, Subcutaneous, QHS, Cox, Amy N, DO   insulin aspart (novoLOG) injection 0-9 Units, 0-9 Units, Subcutaneous, TID WC, Cox, Amy N, DO   ipratropium-albuterol (DUONEB) 0.5-2.5 (3) MG/3ML nebulizer solution 3 mL, 3 mL, Nebulization, Q6H PRN, Cox, Amy N, DO   ondansetron (ZOFRAN)  tablet 4 mg, 4 mg, Oral, Q6H PRN **OR** ondansetron (ZOFRAN) injection 4 mg, 4 mg, Intravenous, Q6H PRN, Cox, Amy N, DO   pantoprazole (PROTONIX) 80 mg /NS 100 mL IVPB, 80 mg, Intravenous, BID, Cox, Amy N, DO   senna-docusate (Senokot-S) tablet 1 tablet, 1 tablet, Oral, QHS PRN, Cox, Amy N, DO  Current Outpatient Medications:    losartan (COZAAR) 25 MG tablet, Take 25 mg by mouth daily., Disp: , Rfl:    acetaminophen (TYLENOL) 500 MG tablet, Take 500 mg by mouth every 6 (six) hours as needed for mild pain., Disp: , Rfl:    albuterol (VENTOLIN HFA) 108 (90 Base) MCG/ACT inhaler, Inhale 2 puffs into the lungs in the morning, at noon, in the evening, and at bedtime., Disp: , Rfl:    cyanocobalamin (VITAMIN B12) 500 MCG tablet, Take 1,000 mcg by mouth daily., Disp: , Rfl:    ferrous sulfate 325 (65 FE) MG tablet, Take 325 mg by mouth daily with breakfast., Disp: , Rfl:    furosemide (LASIX) 40 MG tablet, Take 40 mg by mouth. 1/2 pill every other day- Singh, Disp: , Rfl:    gemfibrozil (LOPID) 600 MG tablet, TAKE 1 TABLET BY MOUTH TWICE  DAILY BEFORE MEALS, Disp: 180 tablet, Rfl: 0   insulin glargine (LANTUS) 100 UNIT/ML Solostar Pen, Inject 20 Units into the skin at bedtime. (Patient taking differently: Inject 18 Units into the skin at bedtime.), Disp: 15 mL, Rfl: 11   Interferon Beta-1b (BETASERON) 0.3 MG KIT injection, Inject subcutaneously 1 syringe every other day., Disp: 45 kit, Rfl: 4   loratadine (CLARITIN) 10 MG tablet, Take 1 tablet (10 mg total) by mouth daily., Disp: 90 tablet, Rfl: 1   metFORMIN (GLUCOPHAGE) 500 MG tablet, Take 500 mg by mouth 2 (two) times daily., Disp: , Rfl:    mupirocin ointment (BACTROBAN) 2 %, Apply 1 Application topically 2 (two) times daily. Apply to effected area bid, Disp: 30 g, Rfl: 1   omeprazole (PRILOSEC) 40 MG capsule, TAKE 1 CAPSULE BY MOUTH DAILY, Disp: 90 capsule, Rfl: 0   pregabalin (LYRICA) 200 MG capsule, Take 1 capsule (200 mg total) by mouth 2 (two)  times daily., Disp: 60 capsule, Rfl: 5   sertraline (ZOLOFT) 50 MG tablet, TAKE ONE-HALF TABLET BY MOUTH  DAILY, Disp: 45 tablet, Rfl: 0   simvastatin (ZOCOR) 40 MG tablet, Take 1 tablet (40 mg total) by mouth daily., Disp: 90 tablet, Rfl: 1   sodium bicarbonate 650  MG tablet, Take 650 mg by mouth 2 (two) times daily., Disp: , Rfl:    traMADol (ULTRAM) 50 MG tablet, Take 1 tablet (50 mg total) by mouth every 6 (six) hours as needed., Disp: 120 tablet, Rfl: 2  Facility-Administered Medications Ordered in Other Encounters:    0.9 %  sodium chloride infusion, , Intravenous, Continuous, Rickard Patience, MD, Stopped at 11/04/22 1432   diphenhydrAMINE (BENADRYL) capsule 25 mg, 25 mg, Oral, Once, Rickard Patience, MD  Review of Systems  Constitutional:  Positive for fatigue. Negative for appetite change, chills, fever and unexpected weight change.  HENT:   Negative for hearing loss and voice change.   Eyes:  Negative for eye problems and icterus.  Respiratory:  Negative for chest tightness and cough.   Cardiovascular:  Negative for chest pain and leg swelling.  Gastrointestinal:  Negative for abdominal distention and abdominal pain.       Dark-colored stool  Endocrine: Negative for hot flashes.  Genitourinary:  Negative for difficulty urinating, dysuria and frequency.   Musculoskeletal:  Negative for arthralgias.  Skin:  Negative for itching and rash.  Neurological:  Negative for light-headedness and numbness.  Hematological:  Negative for adenopathy. Does not bruise/bleed easily.  Psychiatric/Behavioral:  Negative for confusion.     PHYSICAL EXAM Vitals:   05/06/23 1144 05/06/23 1300 05/06/23 1423 05/06/23 1427  BP:  (!) 123/51  (!) 144/64  Pulse:  74  89  Resp:  (!) 22  16  Temp:   98 F (36.7 C)   TempSrc:   Oral   SpO2:  (!) 89%  95%  Weight: 196 lb (88.9 kg)     Height: 5\' 10"  (1.778 m)      Physical Exam Constitutional:      General: He is not in acute distress.    Appearance: He is not  diaphoretic.  HENT:     Head: Normocephalic and atraumatic.  Eyes:     General: No scleral icterus. Cardiovascular:     Rate and Rhythm: Normal rate.  Pulmonary:     Effort: Pulmonary effort is normal. No respiratory distress.  Abdominal:     General: Bowel sounds are normal. There is no distension.     Palpations: Abdomen is soft.  Musculoskeletal:        General: Normal range of motion.     Cervical back: Normal range of motion.  Skin:    General: Skin is warm and dry.     Coloration: Skin is pale.     Findings: No erythema.  Neurological:     Mental Status: He is alert and oriented to person, place, and time.     Cranial Nerves: No cranial nerve deficit.     Motor: No abnormal muscle tone.     Coordination: Coordination normal.  Psychiatric:        Mood and Affect: Mood and affect normal.       LABORATORY STUDIES    Latest Ref Rng & Units 05/06/2023   11:20 AM 05/05/2023    2:01 PM 04/07/2023    1:02 PM  CBC  WBC 4.0 - 10.5 K/uL 9.3  7.5  8.5   Hemoglobin 13.0 - 17.0 g/dL 6.7  7.6  8.7   Hematocrit 39.0 - 52.0 % 21.8  26.0  28.4   Platelets 150 - 400 K/uL 227  278  319       Latest Ref Rng & Units 05/06/2023   11:20 AM 03/18/2023    2:29 PM  02/09/2023    2:09 PM  CMP  Glucose 70 - 99 mg/dL 865  784  696   BUN 8 - 23 mg/dL 32  25  29   Creatinine 0.61 - 1.24 mg/dL 2.95  2.84  1.32   Sodium 135 - 145 mmol/L 140  140  139   Potassium 3.5 - 5.1 mmol/L 4.0  4.1  4.4   Chloride 98 - 111 mmol/L 111  107  110   CO2 22 - 32 mmol/L 23  22  24    Calcium 8.9 - 10.3 mg/dL 7.9  8.7  8.6   Total Protein 6.5 - 8.1 g/dL 5.9  7.0    Total Bilirubin 0.3 - 1.2 mg/dL 0.9  0.8    Alkaline Phos 38 - 126 U/L 76  91    AST 15 - 41 U/L 22  29    ALT 0 - 44 U/L 10  13       RADIOGRAPHIC STUDIES: I have personally reviewed the radiological images as listed and agreed with the findings in the report. DG Chest Portable 1 View  Result Date: 05/06/2023 CLINICAL DATA:  Shortness of  breath EXAM: PORTABLE CHEST 1 VIEW COMPARISON:  Chest radiograph 11/24/2022 FINDINGS: The heart is enlarged.  The upper mediastinal contours are normal There are diffusely increased interstitial markings likely reflecting pulmonary interstitial edema. Previously seen opacity in the left midlung has resolved. There is no focal consolidation. There is a small right pleural effusion. There is no significant left effusion. There is no pneumothorax There is no acute osseous abnormality. IMPRESSION: Cardiomegaly with a small right pleural effusion and pulmonary interstitial edema. Findings may reflect CHF. Electronically Signed   By: Lesia Hausen M.D.   On: 05/06/2023 11:20     Assessment and plan-   # Acute on chronic anemia, symptomatic. History of iron deficiency anemia, AVM, anemia due to chronic kidney disease.  Refractory to outpatient IV Venofer treatments/erythropoietin Recommend 2 units of PRBC transfusion, PPI  Suspect GI blood loss, consult gastroenterology.  Thank you for allowing me to participate in the care of this patient.   Rickard Patience, MD, PhD Hematology Oncology 05/06/2023

## 2023-05-06 NOTE — Assessment & Plan Note (Deleted)
Inpatient gastroenterology service has been consulted to Dr. Mia Creek

## 2023-05-06 NOTE — H&P (Addendum)
Addendum: Patient has completed transfusion of 1 units of packed red blood cells.  # Acute on chronic anemia, history of AVM and chronic esophagitis and gastritis - Repeat H&H ordered at 4:59 PM, at the time of this dictation, the H&H is still in process - Goal hemoglobin > 7, discussed with cross coverage provider   History and Physical   Jeremy [name] ZOX:[id number] DOB: 1951-03-25 DOA: 05/06/2023  PCP: Duanne Limerick, MD  Outpatient Specialists: Dr. Cathie Hoops, outpatient medical oncologist/hematologist Patient coming from: Home  I have personally briefly reviewed patient's old medical records in [facility] EMR.  Chief Concern: Weakness and fatigue  HPI: [name] is a 72 year old male with history of iron deficiency anemia presumed secondary to chronic blood loss in setting of possible venous malformation and Chronic gastritis, CKD stage II/IIIa, hypertension, insulin-dependent diabetes mellitus, depression, hyperlipidemia, who presents to the emergency department for chief concerns of weakness.  Patient was on his way to outpatient medical oncology for scheduled blood transfusion however felt too weak to "make it".  Patient was advised by outpatient medical oncologist to present to the emergency department.  Vitals in the ED showed temperature of 98.5, respiration rate of 14, heart rate of 84, blood pressure 144/74, SpO2 of 91% on 2 L nasal cannula.  Serum sodium is 140, potassium 4.0, chloride 111, bicarb 23, BUN of 32, serum creatinine 1.27, nonfasting blood glucose 112, EGFR was greater than 60, WBC 9.3, hemoglobin 6.7, platelets of 227.  High sensitivity troponin was elevated at 27.  ED treatment: Patient has been typed and screened, and 2 units of PRBC ordered for transfusion. ---------------------------- At bedside, patient was able to tell me his name, age, location, current calendar year. He was weak and had difficulty with telling me the year and age. He appears  lethargic and sleepy.  He was arousable with verbal stimuli.  His spouse was at bedside.  He reports that today he felt too weak to get out of bed.  He had a scheduled appointment for outpatient blood transfusion with his oncology clinic however he called the clinic and states he was too weak and per the advice of his oncologist/hematologist, patient with advised to come to the emergency department for further evaluation.  Currently he denies chest pain, abdominal pain, dysuria, hematuria, diarrhea, blood in his stool.  He denies syncope.  He reports his last bowel movement was yesterday and it was normal in texture and consistency.  Patient is on iron supplementation and therefore his stool is always dark.  He denies trauma to his person.  He denies fever, chills at home.  He endorses shortness of breath with exertion.  His spouse reports that many years ago he used to be 310 pounds and currently he has 196 pounds.  She was not able to tell me how long ago that he was 210 pounds.  He reports that he has no appetite in the last 2 days however overall he does have an appetite.  He denies nausea, vomiting.  Social history: He was at home with his spouse.  He denies tobacco, EtOH, recreational drug use.  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  ROS: Constitutional: + weight change (chronic weight loss of over 100 pounds over several years), no fever ENT/Mouth: no sore throat, no rhinorrhea Eyes: no eye pain, no vision changes Cardiovascular: no chest pain, + dyspnea with exertion,  no edema, no palpitations Respiratory: no cough, no sputum, no wheezing Gastrointestinal: no nausea, no  vomiting, no diarrhea, no constipation Genitourinary: no urinary incontinence, no dysuria, no hematuria Musculoskeletal: no arthralgias, no myalgias Skin: no skin lesions, no pruritus, Neuro: + weakness, no loss of consciousness, no syncope Psych: no anxiety, no depression, + decrease appetite Heme/Lymph:  no bruising, no bleeding  ED Course: Discussed with emergency medicine provider, patient requiring hospitalization for chief concerns of symptomatic anemia.  Assessment/Plan  Principal Problem:   Symptomatic anemia Active Problems:   Anemia due to blood loss   Arteriovenous malformation (AVM)   Type 2 diabetes mellitus with diabetic polyneuropathy, with long-term current use of insulin (HCC)   Stage 3a chronic kidney disease (HCC)   COPD (chronic obstructive pulmonary disease) (HCC)   Multiple sclerosis (HCC)   Essential hypertension   Hyperlipidemia   Depression   Gastroesophageal reflux disease without esophagitis   Primary pulmonary hypertension (HCC)   Anemia in chronic kidney disease (CKD)   Elevated troponin   Assessment and Plan:  * Symptomatic anemia Etiology workup in progress Patient's outpatient hematologist/oncologist has been consulted, Dr. Cathie Hoops via secure chat and Epic order Dr. Cathie Hoops has requested GI consultation, Dr. Mia Creek has been consulted via secure chat and Epic order Patient is status post type and screen and 2 units of PRBC ordered for transfusion per EDP Telemetry cardiac, inpatient  Arteriovenous malformation (AVM) Inpatient gastroenterology service has been consulted to Dr. Mia Creek  Anemia due to blood loss Presume multifactorial, differentials include AVM, chronic gastritis and esophagitis Given gastritis and esophagitis, Protonix 80 mg IV twice daily, 5 days of coverage ordered on admission  Type 2 diabetes mellitus with diabetic polyneuropathy, with long-term current use of insulin (HCC) Metformin 500 mg p.o. twice daily not resumed on admission Insulin glargine, 18 units not resumed on admission as patient is n.p.o. after midnight Insulin SSI with HS coverage ordered Goal inpatient blood glucose level is 140-180  Stage 3a chronic kidney disease (HCC) At baseline  COPD (chronic obstructive pulmonary disease) (HCC) Patient does not appear to  be in acute exacerbation at this time DuoNebs every 6 hours as needed for wheezing and shortness of breath, 3 days of coverage ordered  Elevated troponin Suspect secondary to acute on chronic blood loss anemia We will continue to monitor second troponin level, treat acute on chronic anemia per above  Anemia in chronic kidney disease (CKD) Baseline hgb is 8-9 in March 2024  Depression Sertraline 25 mg daily resumed for 7/13  Hyperlipidemia Simvastatin 40 mg every evening resumed  Essential hypertension Patient blood pressure is controlled and low normotensive on admission, home losartan 25 mg daily not resumed on admission; furosemide 40 mg, half a pill every other day not resumed on admission Hydralazine 5 mg IV every 8 hours as needed for SBP greater 175, 5 days ordered  Chart reviewed.   04/04/2023 outpatient GI, Dr. Servando Snare: Patient recommended to continue to have outpatient iron and blood transfusion with growth factors and if this does not correct the problem, patient may need repeat capsule endoscopy to look for other source of bleeding which may entail a visit back to [facility] for double-balloon endoscopy for cauterization.  01/10/2023 retrograde double-balloon endoscopy: Preparation of the colon was inadequate.  Patent end-to-side ileocolonic anastomoses.  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  1 polyp in the mid ileum removed with hot snare, resected and retrieved and tattooed.  Polyp was sent to pathology.  Hospitalization on 11/24/2022 to 11/26/2022: Patient admitted for recurrent severe anemia and received 3 units  of PRBC transfusion with hemoglobin improvement.  GI was consulted and recommended that patient go home with outpatient follow-up to [facility] provider [facility] for double-balloon endoscopy.  Upper capsule endoscopy 09/25/2021: Esophagitis, gastritis.  Areas as poor prep throughout.  Small bowel polyp in the distal one third of small  bowel.  Nonbleeding AVM in the distal one third of small bowel.  Area of erythema and colon secured by poor prep.  DVT prophylaxis: TED hose; AM team to initiate pharmacologic DVT prophylaxis when the benefits outweigh the risk Code Status: full code Diet: Clear liquid; n.p.o. after midnight Family Communication: Updated his spouse, Deatra Ina at bedside with patient's permission Disposition Plan: Pending GI and hematologist evaluation Consults called: Hematologist, gastroenterologist Admission status: Telemetry cardiac, inpatient  Past Medical History:  Diagnosis Date   Chronic pain    Depression    Diabetes (HCC)    GERD (gastroesophageal reflux disease)    GI bleed 12/2022   Hyperlipemia    Hypertension    MS (multiple sclerosis) (HCC)    Past Surgical History:  Procedure Laterality Date   COLECTOMY  06-2008   COLONOSCOPY  2015   normal   COLONOSCOPY WITH PROPOFOL N/A 09/10/2021   Procedure: COLONOSCOPY WITH PROPOFOL;  Surgeon: Midge Minium, MD;  Location: [facility] ENDOSCOPY;  Service: Endoscopy;  Laterality: N/A;   ESOPHAGOGASTRODUODENOSCOPY (EGD) WITH PROPOFOL N/A 09/10/2021   Procedure: ESOPHAGOGASTRODUODENOSCOPY (EGD) WITH PROPOFOL;  Surgeon: Midge Minium, MD;  Location: [facility] ENDOSCOPY;  Service: Endoscopy;  Laterality: N/A;   GIVENS CAPSULE STUDY N/A 09/25/2021   Procedure: GIVENS CAPSULE STUDY;  Surgeon: Wyline Mood, MD;  Location: [facility] ENDOSCOPY;  Service: Gastroenterology;  Laterality: N/A;   Social History:  reports that he quit smoking about 17 years ago. His smoking use included cigarettes. He started smoking about 47 years ago. He has a 45 pack-year smoking history. He has never used smokeless tobacco. He reports that he does not currently use alcohol. He reports that he does not use drugs.  Allergies  Allergen Reactions   Betadine [Povidone Iodine]    Family History  Problem Relation Age of Onset   Lung cancer Mother    Heart attack Father    Heart attack Brother     COPD Brother    Diabetes Brother    Esophageal cancer Nephew    Family history: Family history reviewed and not pertinent.  Prior to Admission medications   Medication Sig Start Date End Date Taking? Authorizing Provider  acetaminophen (TYLENOL) 500 MG tablet Take 500 mg by mouth every 6 (six) hours as needed for mild pain.    [provider]  albuterol (VENTOLIN HFA) 108 (90 Base) MCG/ACT inhaler Inhale 2 puffs into the lungs in the morning, at noon, in the evening, and at bedtime.    [provider]  B-D INSULIN SYRINGE 1CC/25GX1" 25G X 1" 1 ML MISC USE AS DIRECTED 06/22/21   Duanne Limerick, MD  cyanocobalamin (VITAMIN B12) 500 MCG tablet Take 1,000 mcg by mouth daily.    [provider]  ferrous sulfate 325 (65 FE) MG tablet Take 325 mg by mouth daily with breakfast.    [provider]  furosemide (LASIX) 40 MG tablet Take 40 mg by mouth. 1/2 pill every other day- Thedore Mins    [provider]  gemfibrozil (LOPID) 600 MG tablet TAKE 1 TABLET BY MOUTH TWICE  DAILY BEFORE MEALS 05/03/23   Duanne Limerick, MD  insulin glargine (LANTUS) 100 UNIT/ML Solostar Pen Inject 20 Units  into the skin at bedtime. Patient taking differently: Inject 18 Units into the skin at bedtime. 08/19/22   Darlin Priestly, MD  Interferon Beta-1b (BETASERON) 0.3 MG KIT injection Inject subcutaneously 1 syringe every other day. 08/25/22   Levert Feinstein, MD  loratadine (CLARITIN) 10 MG tablet Take 1 tablet (10 mg total) by mouth daily. 12/31/22   Duanne Limerick, MD  metFORMIN (GLUCOPHAGE) 500 MG tablet Take 500 mg by mouth 2 (two) times daily.    [provider]  mupirocin ointment (BACTROBAN) 2 % Apply 1 Application topically 2 (two) times daily. Apply to effected area bid 07/02/22   Duanne Limerick, MD  omeprazole (PRILOSEC) 40 MG capsule TAKE 1 CAPSULE BY MOUTH DAILY 05/03/23   Duanne Limerick, MD  pregabalin (LYRICA) 200 MG capsule Take 1 capsule (200 mg total) by mouth 2 (two) times  daily. 12/30/22   Edward Jolly, MD  sertraline (ZOLOFT) 50 MG tablet TAKE ONE-HALF TABLET BY MOUTH  DAILY 05/03/23   Duanne Limerick, MD  simvastatin (ZOCOR) 40 MG tablet Take 1 tablet (40 mg total) by mouth daily. 12/31/22   Duanne Limerick, MD  sodium bicarbonate 650 MG tablet Take 650 mg by mouth 2 (two) times daily.    [provider]  traMADol (ULTRAM) 50 MG tablet Take 1 tablet (50 mg total) by mouth every 6 (six) hours as needed. 04/08/23 07/07/23  Edward Jolly, MD   Physical Exam: Vitals:   05/06/23 1427 05/06/23 1430 05/06/23 1458 05/06/23 1600  BP: (!) 144/64 (!) 147/63 (!) 150/60 (!) 154/55  Pulse: 89 78 78 80  Resp: 16 15 14 19   Temp:  98 F (36.7 C) 97.6 F (36.4 C) 97.8 F (36.6 C)  TempSrc:  Oral Oral Oral  SpO2: 95% 93% 95% 96%  Weight:      Height:       Constitutional: appears older than chronological age, acutely ill, lethargic, weak, Eyes: PERRL, lids and conjunctivae normal ENMT: Mucous membranes are moist. Posterior pharynx clear of any exudate or lesions. Age-appropriate dentition. Hearing appropriate Neck: normal, supple, no masses, no thyromegaly Respiratory: clear to auscultation bilaterally, no wheezing, no crackles. Normal respiratory effort. No accessory muscle use.  Cardiovascular: Regular rate and rhythm, no murmurs / rubs / gallops. No extremity edema. 2+ pedal pulses. No carotid bruits.  Abdomen: obese abdomen, no tenderness, no masses palpated, no hepatosplenomegaly. Bowel sounds positive.  Musculoskeletal: no clubbing / cyanosis. No joint deformity upper and lower extremities. Good ROM, no contractures, no atrophy. Normal muscle tone.  Skin: pale, no rashes, lesions, ulcers. No induration Neurologic: Sensation intact. Strength 5/5 in all 4.  Psychiatric: Normal judgment and insight. Alert and oriented x 3. Flat affect.   EKG: independently reviewed, showing sinus rhythm with rate of 80, QTc 498  Chest x-ray on Admission: I personally reviewed  and I agree with radiologist reading as below.  DG Chest Portable 1 View  Result Date: 05/06/2023 CLINICAL DATA:  Shortness of breath EXAM: PORTABLE CHEST 1 VIEW COMPARISON:  Chest radiograph 11/24/2022 FINDINGS: The heart is enlarged.  The upper mediastinal contours are normal There are diffusely increased interstitial markings likely reflecting pulmonary interstitial edema. Previously seen opacity in the left midlung has resolved. There is no focal consolidation. There is a small right pleural effusion. There is no significant left effusion. There is no pneumothorax There is no acute osseous abnormality. IMPRESSION: Cardiomegaly with a small right pleural effusion and pulmonary interstitial edema. Findings may reflect CHF. Electronically  Signed   By: Lesia Hausen M.D.   On: 05/06/2023 11:20    Labs on Admission: I have personally reviewed following labs  CBC: Recent Labs  Lab 05/05/23 1401 05/06/23 1120  WBC 7.5 9.3  NEUTROABS 5.3  --   HGB 7.6* 6.7*  HCT 26.0* 21.8*  MCV 97.0 95.2  PLT 278 227   Basic Metabolic Panel: Recent Labs  Lab 05/06/23 1120  NA 140  K 4.0  CL 111  CO2 23  GLUCOSE 112*  BUN 32*  CREATININE 1.27*  CALCIUM 7.9*   GFR: Estimated Creatinine Clearance: 59.9 mL/min (A) (by C-G formula based on SCr of 1.27 mg/dL (H)).  Liver Function Tests: Recent Labs  Lab 05/06/23 1120  AST 22  ALT 10  ALKPHOS 76  BILITOT 0.9  PROT 5.9*  ALBUMIN 2.3*   Coagulation Profile: Recent Labs  Lab 05/06/23 1120  INR 1.3*   Anemia Panel: Recent Labs    05/05/23 1401  FERRITIN 160  TIBC 325  IRON 31*  RETICCTPCT 6.0*   Urine analysis:    Component Value Date/Time   COLORURINE YELLOW (A) 08/18/2022 1237   APPEARANCEUR CLEAR (A) 08/18/2022 1237   APPEARANCEUR Hazy 08/27/2013 1157   LABSPEC 1.017 08/18/2022 1237   LABSPEC 1.013 08/27/2013 1157   PHURINE 5.0 08/18/2022 1237   GLUCOSEU NEGATIVE 08/18/2022 1237   GLUCOSEU Negative 08/27/2013 1157   HGBUR  MODERATE (A) 08/18/2022 1237   BILIRUBINUR NEGATIVE 08/18/2022 1237   BILIRUBINUR Negative 08/27/2013 1157   KETONESUR NEGATIVE 08/18/2022 1237   PROTEINUR NEGATIVE 08/18/2022 1237   NITRITE NEGATIVE 08/18/2022 1237   LEUKOCYTESUR NEGATIVE 08/18/2022 1237   LEUKOCYTESUR Negative 08/27/2013 1157   CRITICAL CARE Performed by: Dr. Sedalia Muta  Total critical care time: 32 minutes  Critical care time was exclusive of separately billable procedures and treating other patients.  Critical care was necessary to treat or prevent imminent or life-threatening deterioration.  Critical care was time spent personally by me on the following activities: development of treatment plan with patient and/or surrogate as well as nursing, discussions with consultants, evaluation of patient's response to treatment, examination of patient, obtaining history from patient or surrogate, ordering and performing treatments and interventions, ordering and review of laboratory studies, ordering and review of radiographic studies, pulse oximetry and re-evaluation of patient's condition.  This document was prepared using Dragon Voice Recognition software and may include unintentional dictation errors.  Dr. Sedalia Muta [facility]  If 7PM-7AM, please contact overnight-coverage provider If 7AM-7PM, please contact day attending provider [email or web address]  05/06/2023, 4:40 PM

## 2023-05-06 NOTE — Consult Note (Signed)
Consultation  Referring Provider:     Dr Sedalia Muta Admit date 05/06/23 Consult date        05/06/23 Reason for Consultation:     symptomatic anemia/gastritis         HPI:   Jeremy [name] is a 72 y.o. 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 His appetite has been poor. was advised to go to ED for assessment Pet 2/23 suggestive of possible cirrhosis Pt 16.6, inr 1.3. some renal insufficiency, normal platelets, liver enzymes normal. His wife is in the room with him. He has been following with Dr Cathie Hoops and receiving iron infusions. He has had GI eval with Dr Servando Snare over the last couple of years and a repeat capsule was planned.  They report that he has been having black stools for some time- thought is was due to oral iron but also occurring when he was IV iron only. Last was yesterday. They deny any NSaids. He is not currently on PPI. He has no history of known liver disease. He denies any heartburn, abdominal pain, dysphagia, diarrhea, NV, further GI concerns. He has been started on PPI and is receiving some PRBCs. States he feels much better with the transfusion. Following with [place] neurologist last seen 7.9.24, on Betaseron every other day.  PREVIOUS ENDOSCOPIES:      Small bowel retrograde enteroscopy 3/24 (double balloon)-  Dr Faylene Million ([facility]) Patent end to side ileo-colonic anastomosis, distal and terminal ileal avms that were cauterized, sessile polyp resected with hot snare- this was small intestinal mucosa with benign proliferation of capillary vessels compatible with AVM, VCE 2023- Dr Rodney Cruise and macrocytic anemia- esophagitis, gastritis, distal small bowel  polyp, distal small bowel avm, areas of poor prep     EGD 2022- Dr Wynonia Hazard- irregular zline, moderate gastritis, 10mm sessile gastric polyp, normal duodenum  Colonoscopy 2022- Dr Servando Snare- patent end to end colonic anastomosis, hemorrhoids   Past Medical History:  Diagnosis Date   Chronic pain    Depression    Diabetes (HCC)    GERD (gastroesophageal reflux disease)    GI bleed 12/2022   Hyperlipemia    Hypertension    MS (multiple sclerosis) (HCC)     Past Surgical History:  Procedure Laterality Date   COLECTOMY  06-2008   COLONOSCOPY  2015   normal   COLONOSCOPY WITH PROPOFOL N/A 09/10/2021   Procedure: COLONOSCOPY WITH PROPOFOL;  Surgeon: Midge Minium, MD;  Location: [facility] ENDOSCOPY;  Service: Endoscopy;  Laterality: N/A;   ESOPHAGOGASTRODUODENOSCOPY (EGD) WITH PROPOFOL N/A 09/10/2021   Procedure: ESOPHAGOGASTRODUODENOSCOPY (EGD) WITH PROPOFOL;  Surgeon: Midge Minium, MD;  Location: [facility] ENDOSCOPY;  Service: Endoscopy;  Laterality: N/A;   GIVENS CAPSULE STUDY N/A 09/25/2021   Procedure: GIVENS CAPSULE STUDY;  Surgeon: Wyline Mood, MD;  Location: [facility] ENDOSCOPY;  Service: Gastroenterology;  Laterality: N/A;    Family History  Problem Relation Age of Onset   Lung cancer Mother    Heart attack Father    Heart attack Brother    COPD Brother    Diabetes Brother    Esophageal cancer Nephew      Social History   Tobacco Use   Smoking status: Former    Current packs/day: 0.00    Average  packs/day: 1.5 packs/day for 30.0 years (45.0 ttl pk-yrs)    Types: Cigarettes    Start date: 01/24/1976    Quit date: 01/23/2006    Years since quitting: 17.2   Smokeless tobacco: Never   Tobacco comments:    N/A  Vaping Use   Vaping status: Never Used  Substance Use Topics   Alcohol use: Not Currently    Comment: rare; maybe 2 beers a year   Drug use: No    Prior to Admission medications   Medication Sig Start Date End Date Taking? Authorizing Provider  acetaminophen (TYLENOL) 500  MG tablet Take 500 mg by mouth every 6 (six) hours as needed for mild pain.    [provider]  albuterol (VENTOLIN HFA) 108 (90 Base) MCG/ACT inhaler Inhale 2 puffs into the lungs in the morning, at noon, in the evening, and at bedtime.    [provider]  B-D INSULIN SYRINGE 1CC/25GX1" 25G X 1" 1 ML MISC USE AS DIRECTED 06/22/21   Duanne Limerick, MD  cyanocobalamin (VITAMIN B12) 500 MCG tablet Take 1,000 mcg by mouth daily.    [provider]  ferrous sulfate 325 (65 FE) MG tablet Take 325 mg by mouth daily with breakfast.    [provider]  furosemide (LASIX) 40 MG tablet Take 40 mg by mouth. 1/2 pill every other day- Singh    [provider]  gemfibrozil (LOPID) 600 MG tablet TAKE 1 TABLET BY MOUTH TWICE  DAILY BEFORE MEALS 05/03/23   Duanne Limerick, MD  insulin glargine (LANTUS) 100 UNIT/ML Solostar Pen Inject 20 Units into the skin at bedtime. Patient taking differently: Inject 18 Units into the skin at bedtime. 08/19/22   Darlin Priestly, MD  Interferon Beta-1b (BETASERON) 0.3 MG KIT injection Inject subcutaneously 1 syringe every other day. 08/25/22   Levert Feinstein, MD  loratadine (CLARITIN) 10 MG tablet Take 1 tablet (10 mg total) by mouth daily. 12/31/22   Duanne Limerick, MD  metFORMIN (GLUCOPHAGE) 500 MG tablet Take 500 mg by mouth 2 (two) times daily.    [provider]  mupirocin ointment (BACTROBAN) 2 % Apply 1 Application topically 2 (two) times daily. Apply to effected area bid 07/02/22   Duanne Limerick, MD  omeprazole (PRILOSEC) 40 MG capsule TAKE 1 CAPSULE BY MOUTH DAILY 05/03/23   Duanne Limerick, MD  pregabalin (LYRICA) 200 MG capsule Take 1 capsule (200 mg total) by mouth 2 (two) times daily. 12/30/22   Edward Jolly, MD  sertraline (ZOLOFT) 50 MG tablet TAKE ONE-HALF TABLET BY MOUTH  DAILY 05/03/23   Duanne Limerick, MD  simvastatin (ZOCOR) 40 MG tablet Take 1 tablet (40 mg total) by mouth daily. 12/31/22   Duanne Limerick, MD  sodium  bicarbonate 650 MG tablet Take 650 mg by mouth 2 (two) times daily.    [provider]  traMADol (ULTRAM) 50 MG tablet Take 1 tablet (50 mg total) by mouth every 6 (six) hours as needed. 04/08/23 07/07/23  Edward Jolly, MD    Current Facility-Administered Medications  Medication Dose Route Frequency Provider Last Rate Last Admin   0.9 %  sodium chloride infusion  10 mL/hr Intravenous Once Cox, Amy N, DO       acetaminophen (TYLENOL) tablet 650 mg  650 mg Oral Q6H PRN Cox, Amy N, DO       Or   acetaminophen (TYLENOL) suppository 650 mg  650 mg Rectal Q6H PRN Cox, Amy N, DO  hydrALAZINE (APRESOLINE) injection 5 mg  5 mg Intravenous Q8H PRN Cox, Amy N, DO       insulin aspart (novoLOG) injection 0-5 Units  0-5 Units Subcutaneous QHS Cox, Amy N, DO       insulin aspart (novoLOG) injection 0-9 Units  0-9 Units Subcutaneous TID WC Cox, Amy N, DO       ipratropium-albuterol (DUONEB) 0.5-2.5 (3) MG/3ML nebulizer solution 3 mL  3 mL Nebulization Q6H PRN Cox, Amy N, DO       ondansetron (ZOFRAN) tablet 4 mg  4 mg Oral Q6H PRN Cox, Amy N, DO       Or   ondansetron (ZOFRAN) injection 4 mg  4 mg Intravenous Q6H PRN Cox, Amy N, DO       pantoprazole (PROTONIX) 80 mg /NS 100 mL IVPB  80 mg Intravenous BID Cox, Amy N, DO       senna-docusate (Senokot-S) tablet 1 tablet  1 tablet Oral QHS PRN Cox, Amy N, DO       Current Outpatient Medications  Medication Sig Dispense Refill   acetaminophen (TYLENOL) 500 MG tablet Take 500 mg by mouth every 6 (six) hours as needed for mild pain.     albuterol (VENTOLIN HFA) 108 (90 Base) MCG/ACT inhaler Inhale 2 puffs into the lungs in the morning, at noon, in the evening, and at bedtime.     B-D INSULIN SYRINGE 1CC/25GX1" 25G X 1" 1 ML MISC USE AS DIRECTED 10 each 2   cyanocobalamin (VITAMIN B12) 500 MCG tablet Take 1,000 mcg by mouth daily.     ferrous sulfate 325 (65 FE) MG tablet Take 325 mg by mouth daily with breakfast.     furosemide (LASIX) 40 MG  tablet Take 40 mg by mouth. 1/2 pill every other day- Singh     gemfibrozil (LOPID) 600 MG tablet TAKE 1 TABLET BY MOUTH TWICE  DAILY BEFORE MEALS 180 tablet 0   insulin glargine (LANTUS) 100 UNIT/ML Solostar Pen Inject 20 Units into the skin at bedtime. (Patient taking differently: Inject 18 Units into the skin at bedtime.) 15 mL 11   Interferon Beta-1b (BETASERON) 0.3 MG KIT injection Inject subcutaneously 1 syringe every other day. 45 kit 4   loratadine (CLARITIN) 10 MG tablet Take 1 tablet (10 mg total) by mouth daily. 90 tablet 1   metFORMIN (GLUCOPHAGE) 500 MG tablet Take 500 mg by mouth 2 (two) times daily.     mupirocin ointment (BACTROBAN) 2 % Apply 1 Application topically 2 (two) times daily. Apply to effected area bid 30 g 1   omeprazole (PRILOSEC) 40 MG capsule TAKE 1 CAPSULE BY MOUTH DAILY 90 capsule 0   pregabalin (LYRICA) 200 MG capsule Take 1 capsule (200 mg total) by mouth 2 (two) times daily. 60 capsule 5   sertraline (ZOLOFT) 50 MG tablet TAKE ONE-HALF TABLET BY MOUTH  DAILY 45 tablet 0   simvastatin (ZOCOR) 40 MG tablet Take 1 tablet (40 mg total) by mouth daily. 90 tablet 1   sodium bicarbonate 650 MG tablet Take 650 mg by mouth 2 (two) times daily.     traMADol (ULTRAM) 50 MG tablet Take 1 tablet (50 mg total) by mouth every 6 (six) hours as needed. 120 tablet 2   Facility-Administered Medications Ordered in Other Encounters  Medication Dose Route Frequency Provider Last Rate Last Admin   0.9 %  sodium chloride infusion   Intravenous Continuous Rickard Patience, MD   Stopped at 11/04/22 1432   diphenhydrAMINE (  BENADRYL) capsule 25 mg  25 mg Oral Once Rickard Patience, MD        Allergies as of 05/06/2023 - Review Complete 05/06/2023  Allergen Reaction Noted   Betadine [povidone iodine]  07/21/2013     Review of Systems:    All systems reviewed and negative except where noted in HPI with exception of unsteady gait and balance problems, chronic pain     Physical Exam:  Vital signs  in last 24 hours: Temp:  [98.5 F (36.9 C)] 98.5 F (36.9 C) (07/12 1130) Pulse Rate:  [74-80] 74 (07/12 1300) Resp:  [14-22] 22 (07/12 1300) BP: (123-144)/(48-74) 123/51 (07/12 1300) SpO2:  [89 %-91 %] 89 % (07/12 1300) Weight:  [88.9 kg] 88.9 kg (07/12 1144)   General:   Pleasant man in NAD appears chronically ill Head:  Normocephalic and atraumatic. Eyes:   No icterus.   Conjunctiva pink. Ears:  Normal auditory acuity. Mouth: Mucosa pink moist, no lesions. Neck:  Supple; no masses felt Lungs:  Respirations even and unlabored. Lungs clear to auscultation bilaterally.   No wheezes, crackles, or rhonchi.  Heart:  S1S2, RRR, no MRG. Trace generalized edema. Abdomen:   Flat, soft, nondistended, nontender. Normal bowel sounds. No appreciable masses or hepatomegaly. No rebound signs or other peritoneal signs. Rectal:  Not performed.  Msk:  MAEW x4, No clubbing or cyanosis. Strength 4/5. Symmetrical without gross deformities. Neurologic:  Alert and  oriented x4;  Cranial nerves II-XII intact.  Skin:  Warm, dry, pink with scattered bruising Psych:  Alert and cooperative. Normal affect.  LAB RESULTS: Recent Labs    05/05/23 1401 05/06/23 1120  WBC 7.5 9.3  HGB 7.6* 6.7*  HCT 26.0* 21.8*  PLT 278 227   BMET Recent Labs    05/06/23 1120  NA 140  K 4.0  CL 111  CO2 23  GLUCOSE 112*  BUN 32*  CREATININE 1.27*  CALCIUM 7.9*   LFT Recent Labs    05/06/23 1120  PROT 5.9*  ALBUMIN 2.3*  AST 22  ALT 10  ALKPHOS 76  BILITOT 0.9   PT/INR Recent Labs    05/06/23 1120  LABPROT 16.6*  INR 1.3*    STUDIES: DG Chest Portable 1 View  Result Date: 05/06/2023 CLINICAL DATA:  Shortness of breath EXAM: PORTABLE CHEST 1 VIEW COMPARISON:  Chest radiograph 11/24/2022 FINDINGS: The heart is enlarged.  The upper mediastinal contours are normal There are diffusely increased interstitial markings likely reflecting pulmonary interstitial edema. Previously seen opacity in the left  midlung has resolved. There is no focal consolidation. There is a small right pleural effusion. There is no significant left effusion. There is no pneumothorax There is no acute osseous abnormality. IMPRESSION: Cardiomegaly with a small right pleural effusion and pulmonary interstitial edema. Findings may reflect CHF. Electronically Signed   By: Lesia Hausen M.D.   On: 05/06/2023 11:20       Impression / Plan:   Anemia/gastritis/history of avms. Agree with present- discussion was had with patient, family and Dr Mia Creek about egd/enteroscopy v repeat VCE and they decided to have the egd/PUSH enteroscopy done. Will plan for this tomorrow as clinically feasible. Question if his betaseron contributes to his anemia. Discuss the possibility of liver disease as o/p   Thank you very much for this consult. These services were provided by Vevelyn Pat, NP-C, in collaboration with Regis Bill, MD, with whom I have discussed this patient in full.   Vevelyn Pat, NP-C

## 2023-05-06 NOTE — Assessment & Plan Note (Addendum)
Patient does not appear to be in acute exacerbation at this time DuoNebs every 6 hours as needed for wheezing and shortness of breath, 3 days of coverage ordered

## 2023-05-06 NOTE — Assessment & Plan Note (Deleted)
Suspect secondary to acute on chronic blood loss anemia We will continue to monitor second troponin level, treat acute on chronic anemia per above

## 2023-05-06 NOTE — Hospital Course (Addendum)
[name] is a 72 year old male with history of iron deficiency anemia presumed secondary to chronic blood loss in setting of possible venous malformation and Chronic gastritis, CKD stage II/IIIa, hypertension, insulin-dependent diabetes mellitus, depression, hyperlipidemia, who presents to the emergency department for chief concerns of weakness. Hemoglobin was 6.7, received blood transfusion.  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  Patient had increased short of breath in the morning of 7/14, gave dose of IV Lasix.  7/17.  Patient on 7 L nasal cannula.  Repeat chest x-ray showing likely pneumonia right lower lobe.  Antibiotics started.  Patient also placed on Lasix for lower extremity swelling and shortness of breath. 7/18.  Patient down to 5 L nasal cannula. 7/19.  Patient down to 4 L this morning but looks like up to 6 L this afternoon.  Will increase Lasix to 40 mg twice daily. 7/20.  Patient on 6 L nasal cannula this morning but I dialed down to 5.  Asked nursing staff to dial down to 4 L of pulse ox greater than 88%.  Today's hemoglobin 7.9.  Patient desaturates with minimal movement. 7/21.  Patient on high flow nasal cannula 5.5 L this morning.  Today's hemoglobin 8.0.

## 2023-05-06 NOTE — Assessment & Plan Note (Addendum)
Last creatinine 1.37 with a GFR of 55.  Will increase Lasix to 40 mg IV twice daily

## 2023-05-06 NOTE — Assessment & Plan Note (Addendum)
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  Continue to monitor hemoglobin.  Last hemoglobin 8.1.  Hematology ordered IV iron today.

## 2023-05-06 NOTE — Anesthesia Preprocedure Evaluation (Signed)
Anesthesia Evaluation  Patient identified by MRN, date of birth, ID band Patient awake    Reviewed: Allergy & Precautions, NPO status , Patient's Chart, lab work & pertinent test results  Airway Mallampati: II  TM Distance: >3 FB Neck ROM: Full    Dental  (+) Edentulous Upper, Edentulous Lower   Pulmonary neg pulmonary ROS, former smoker   Pulmonary exam normal        Cardiovascular hypertension, +CHF  + dysrhythmias Atrial Fibrillation  Rhythm:Regular Rate:Normal     Neuro/Psych negative neurological ROS  negative psych ROS   GI/Hepatic Neg liver ROS, PUD,GERD  Medicated,,  Endo/Other  diabetes, Type 2, Oral Hypoglycemic Agents    Renal/GU   negative genitourinary   Musculoskeletal  (+) Arthritis ,    Abdominal Normal abdominal exam  (+)   Peds  Hematology  (+) Blood dyscrasia, anemia   Anesthesia Other Findings   Reproductive/Obstetrics                             Anesthesia Physical Anesthesia Plan  ASA: 3  Anesthesia Plan: MAC and Regional   Post-op Pain Management:    Induction: Intravenous  PONV Risk Score and Plan: 1 and Ondansetron, Dexamethasone, Propofol infusion and Treatment may vary due to age or medical condition  Airway Management Planned: Simple Face Mask, Natural Airway and Nasal Cannula  Additional Equipment: None  Intra-op Plan:   Post-operative Plan:   Informed Consent: I have reviewed the patients History and Physical, chart, labs and discussed the procedure including the risks, benefits and alternatives for the proposed anesthesia with the patient or authorized representative who has indicated his/her understanding and acceptance.     Dental advisory given  Plan Discussed with: CRNA  Anesthesia Plan Comments:        Anesthesia Quick Evaluation  

## 2023-05-06 NOTE — ED Provider Notes (Signed)
[facility] Provider Note    Event Date/Time   First MD Initiated Contact with Patient 05/06/23 1043     (approximate)  History   Chief Complaint: Dyspnea, weakness, anemia  HPI  Ediel Janus is a 72 y.o. male with a past medical history of CKD, gastric reflux, GI bleeds, hypertension, hyperlipidemia, MS, presents to the emergency department for generalized weakness and anemia.  According to the patient he was scheduled to get a blood transfusion today for known hemoglobin of 7.6, but was too weak to get to the transfusion center.  He states over the last few days he has had progressive generalized weakness.  Patient denies any specific symptoms.  Denies any chest pain shortness of breath abdominal pain vomiting or diarrhea.  Patient states he gets fairly frequent transfusions but he is not sure why he is losing blood.  Physical Exam   Triage Vital Signs: ED Triage Vitals  Encounter Vitals Group     BP      Systolic BP Percentile      Diastolic BP Percentile      Pulse      Resp      Temp      Temp src      SpO2      Weight      Height      Head Circumference      Peak Flow      Pain Score      Pain Loc      Pain Education      Exclude from Growth Chart     Most recent vital signs: There were no vitals filed for this visit.  General: Awake, no distress.  Pale in appearance.  Answering questions appropriately. CV:  Good peripheral perfusion.  Regular rate and rhythm  Resp:  Normal effort.  Equal breath sounds bilaterally.  Abd:  No distention.  Soft, nontender.  No rebound or guarding.   ED Results / Procedures / Treatments   EKG  EKG viewed and interpreted by myself shows a normal sinus rhythm at 80 bpm with a narrow QRS, left axis deviation, largely normal intervals with nonspecific ST changes and occasional PVC.  RADIOLOGY  I have reviewed and interpreted the chest x-ray images.  No consolidation or significant abnormality seen on my  evaluation. Radiology has read the x-ray as cardiomegaly signs of mild interstitial edema.   MEDICATIONS ORDERED IN ED: Medications - No data to display   IMPRESSION / MDM / ASSESSMENT AND PLAN / ED COURSE  I reviewed the triage vital signs and the nursing notes.  Patient's presentation is most consistent with acute presentation with potential threat to life or bodily function.  Patient presents to the emergency department for generalized weakness.  Patient was unable to get a transfusion today due to significant weakness.  Dr. Cathie Hoops of oncology/hematology has reached out saying that the patient had a transfusion scheduled today he has chronic GI blood loss anemia in addition to chronic kidney disease receives EPO injections.  We will recheck labs to see what the patient's current hemoglobin is we will obtain a type and screen as well as coags.  Patient is quite pale in appearance.  He denies any significant GI bleed recently.  Patient's labs have resulted showing hemoglobin of 6.7 this is down from 7.6.  I have ordered 2 units of packed red blood cells for the patient.  I have been messaging with Dr. Cathie Hoops of hematology/oncology who will  be by later to see the patient.  Will admit to the hospital service for further evaluation.  Remainder the lab work shows no significant finding on the chemistry INR is 1.38 troponin minimally elevated at 25.  CRITICAL CARE Performed by: Minna Antis   Total critical care time: 30 minutes  Critical care time was exclusive of separately billable procedures and treating other patients.  Critical care was necessary to treat or prevent imminent or life-threatening deterioration.  Critical care was time spent personally by me on the following activities: development of treatment plan with patient and/or surrogate as well as nursing, discussions with consultants, evaluation of patient's response to treatment, examination of patient, obtaining history from  patient or surrogate, ordering and performing treatments and interventions, ordering and review of laboratory studies, ordering and review of radiographic studies, pulse oximetry and re-evaluation of patient's condition.   FINAL CLINICAL IMPRESSION(S) / ED DIAGNOSES   Weakness Anemia   Note:  This document was prepared using Dragon voice recognition software and may include unintentional dictation errors.   Minna Antis, MD 05/06/23 [phone]

## 2023-05-06 NOTE — ED Triage Notes (Signed)
Pt comes from home due to weakness. Pt was on his way to cancer center to receive blood due to low HBG. Pt' report HGB was 7.2 on 05/05/2023. Pt is A&Ox4. No s/s of distress at this time. Pt wears 2L Sebastian at baseline. Hx of COPD.

## 2023-05-06 NOTE — Assessment & Plan Note (Addendum)
On sliding scale insulin and Lyrica.  Last hemoglobin A1c 1C actually low at 4.4 back in January.

## 2023-05-06 NOTE — Assessment & Plan Note (Addendum)
Simvastatin 40 mg every evening resumed

## 2023-05-06 NOTE — Assessment & Plan Note (Addendum)
Sertraline 25 mg daily resumed for 7/13

## 2023-05-06 NOTE — Assessment & Plan Note (Deleted)
Etiology workup in progress Patient's outpatient hematologist/oncologist has been consulted, Dr. Cathie Hoops via secure chat and Epic order Dr. Cathie Hoops has requested GI consultation, Dr. Mia Creek has been consulted via secure chat and Epic order Patient is status post type and screen and 2 units of PRBC ordered for transfusion per EDP Telemetry cardiac, inpatient

## 2023-05-07 ENCOUNTER — Other Ambulatory Visit: Payer: Self-pay

## 2023-05-07 ENCOUNTER — Inpatient Hospital Stay: Payer: Medicare Other | Admitting: Anesthesiology

## 2023-05-07 ENCOUNTER — Encounter
Admission: EM | Disposition: A | Discharge status: Home Health | Payer: Self-pay | Source: Home / Self Care | Attending: Internal Medicine

## 2023-05-07 ENCOUNTER — Inpatient Hospital Stay: Payer: Medicare Other

## 2023-05-07 DIAGNOSIS — N182 Chronic kidney disease, stage 2 (mild): Secondary | ICD-10-CM | POA: Insufficient documentation

## 2023-05-07 DIAGNOSIS — Z794 Long term (current) use of insulin: Secondary | ICD-10-CM

## 2023-05-07 DIAGNOSIS — D649 Anemia, unspecified: Secondary | ICD-10-CM | POA: Diagnosis not present

## 2023-05-07 DIAGNOSIS — Q2730 Arteriovenous malformation, site unspecified: Secondary | ICD-10-CM | POA: Diagnosis not present

## 2023-05-07 DIAGNOSIS — E1142 Type 2 diabetes mellitus with diabetic polyneuropathy: Secondary | ICD-10-CM | POA: Diagnosis not present

## 2023-05-07 HISTORY — PX: BIOPSY: SHX5522

## 2023-05-07 HISTORY — PX: ENTEROSCOPY: SHX5533

## 2023-05-07 LAB — BRAIN NATRIURETIC PEPTIDE: B Natriuretic Peptide: 704.8 pg/mL — ABNORMAL HIGH (ref 0.0–100.0)

## 2023-05-07 LAB — TYPE AND SCREEN
ABO/RH(D): A POS
Antibody Screen: NEGATIVE

## 2023-05-07 LAB — HEMOGLOBIN
Hemoglobin: 8 g/dL — ABNORMAL LOW (ref 13.0–17.0)
Hemoglobin: 8.6 g/dL — ABNORMAL LOW (ref 13.0–17.0)

## 2023-05-07 LAB — BASIC METABOLIC PANEL
Anion gap: 6 (ref 5–15)
BUN: 29 mg/dL — ABNORMAL HIGH (ref 8–23)
CO2: 25 mmol/L (ref 22–32)
Calcium: 7.8 mg/dL — ABNORMAL LOW (ref 8.9–10.3)
Chloride: 109 mmol/L (ref 98–111)
Creatinine, Ser: 1 mg/dL (ref 0.61–1.24)
GFR, Estimated: >60 mL/min (ref >60)
Glucose, Bld: 92 mg/dL (ref 70–99)
Potassium: 3.9 mmol/L (ref 3.5–5.1)
Sodium: 140 mmol/L (ref 135–145)

## 2023-05-07 LAB — BPAM RBC

## 2023-05-07 LAB — CBC
HCT: 24.7 % — ABNORMAL LOW (ref 39.0–52.0)
Hemoglobin: 7.6 g/dL — ABNORMAL LOW (ref 13.0–17.0)
MCH: 29 pg (ref 26.0–34.0)
MCHC: 30.8 g/dL (ref 30.0–36.0)
MCV: 94.3 fL (ref 80.0–100.0)
Platelets: 226 10*3/uL (ref 150–400)
RBC: 2.62 MIL/uL — ABNORMAL LOW (ref 4.22–5.81)
RDW: 21.6 % — ABNORMAL HIGH (ref 11.5–15.5)
WBC: 6.5 10*3/uL (ref 4.0–10.5)
nRBC: 0 % (ref 0.0–0.2)

## 2023-05-07 LAB — GLUCOSE, CAPILLARY
Glucose-Capillary: 210 mg/dL — ABNORMAL HIGH (ref 70–99)
Glucose-Capillary: 119 mg/dL — ABNORMAL HIGH (ref 70–99)

## 2023-05-07 LAB — CBG MONITORING, ED: Glucose-Capillary: 78 mg/dL (ref 70–99)

## 2023-05-07 SURGERY — ENTEROSCOPY
Anesthesia: General

## 2023-05-07 MED ORDER — ALBUTEROL SULFATE (2.5 MG/3ML) 0.083% IN NEBU
2.5000 mg | INHALATION_SOLUTION | Freq: Four times a day (QID) | RESPIRATORY_TRACT | Status: DC
  Administered 2023-05-07 – 2023-05-08 (×5): 2.5 mg via RESPIRATORY_TRACT
  Filled 2023-05-07 (×6): qty 3

## 2023-05-07 MED ORDER — IPRATROPIUM-ALBUTEROL 0.5-2.5 (3) MG/3ML IN SOLN
RESPIRATORY_TRACT | Status: AC
  Filled 2023-05-07: qty 3

## 2023-05-07 MED ORDER — SODIUM CHLORIDE 0.9 % IV SOLN: INTRAVENOUS | Status: DC | PRN

## 2023-05-07 MED ORDER — PROPOFOL 10 MG/ML IV BOLUS
INTRAVENOUS | Status: AC
  Filled 2023-05-07: qty 20

## 2023-05-07 MED ORDER — LIDOCAINE HCL (CARDIAC) PF 100 MG/5ML IV SOSY
PREFILLED_SYRINGE | INTRAVENOUS | Status: DC | PRN
  Administered 2023-05-07: 40 mg via INTRAVENOUS

## 2023-05-07 MED ORDER — PHENYLEPHRINE HCL (PRESSORS) 10 MG/ML IV SOLN
INTRAVENOUS | Status: DC | PRN
  Administered 2023-05-07: 80 ug via INTRAVENOUS

## 2023-05-07 MED ORDER — IPRATROPIUM-ALBUTEROL 0.5-2.5 (3) MG/3ML IN SOLN
3.0000 mL | RESPIRATORY_TRACT | Status: DC
  Administered 2023-05-07: 3 mL via RESPIRATORY_TRACT

## 2023-05-07 MED ORDER — FUROSEMIDE 20 MG PO TABS
20.0000 mg | ORAL_TABLET | Freq: Every day | ORAL | Status: DC
  Filled 2023-05-07: qty 1

## 2023-05-07 MED ORDER — PROPOFOL 500 MG/50ML IV EMUL
INTRAVENOUS | Status: DC | PRN
  Administered 2023-05-07 (×3): 20 mg via INTRAVENOUS

## 2023-05-07 MED ORDER — SODIUM CHLORIDE 0.9 % IV SOLN: 300.0000 mg | Freq: Once | INTRAVENOUS | Status: DC

## 2023-05-07 MED ORDER — LIDOCAINE HCL (PF) 2 % IJ SOLN
INTRAMUSCULAR | Status: AC
  Filled 2023-05-07: qty 5

## 2023-05-07 NOTE — ED Notes (Signed)
Pt cbg was 78, will tell Lauren, RN.

## 2023-05-07 NOTE — Op Note (Signed)
[facility] Gastroenterology Patient Name: Jeremy [name] Procedure Date: 05/07/2023 10:44 AM MRN: [id number] Account #: [id number] Date of Birth: [birth year] Admit Type: Inpatient Age: 72 Room: [facility] ENDO ROOM 4 Gender: Male Note Status: Finalized Instrument Name: Peds Colonoscope [id number] Procedure:             Small bowel enteroscopy Indications:           Melena Providers:             Eather Colas MD, MD Medicines:             Monitored Anesthesia Care Complications:         No immediate complications. Estimated blood loss:                         Minimal. Procedure:             Pre-Anesthesia Assessment:                        - Prior to the procedure, a History and Physical was                         performed, and patient medications and allergies were                         reviewed. The patient is competent. The risks and                         benefits of the procedure and the sedation options and                         risks were discussed with the patient. All questions                         were answered and informed consent was obtained.                         Patient identification and proposed procedure were                         verified by the physician, the nurse, the                         anesthesiologist, the anesthetist and the technician                         in the endoscopy suite. Mental Status Examination:                         alert and oriented. Airway Examination: normal                         oropharyngeal airway and neck mobility. Respiratory                         Examination: clear to auscultation. CV Examination:                         normal. Prophylactic Antibiotics: The patient does not  require prophylactic antibiotics. Prior                         Anticoagulants: The patient has taken no anticoagulant                         or antiplatelet agents. ASA Grade Assessment: III - A                          patient with severe systemic disease. After reviewing                         the risks and benefits, the patient was deemed in                         satisfactory condition to undergo the procedure. The                         anesthesia plan was to use monitored anesthesia care                         (MAC). Immediately prior to administration of                         medications, the patient was re-assessed for adequacy                         to receive sedatives. The heart rate, respiratory                         rate, oxygen saturations, blood pressure, adequacy of                         pulmonary ventilation, and response to care were                         monitored throughout the procedure. The physical                         status of the patient was re-assessed after the                         procedure.                        After obtaining informed consent, the endoscope was                         passed under direct vision. Throughout the procedure,                         the patient's blood pressure, pulse, and oxygen                         saturations were monitored continuously. The                         Colonoscope was introduced through the mouth and  advanced to the mid-jejunum. The small bowel                         enteroscopy was accomplished without difficulty. The                         patient tolerated the procedure well. Findings:      The examined esophagus was normal.      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 Biopsies were taken with a cold forceps for histology.       Estimated blood loss was minimal.      There was no evidence of significant pathology in the entire examined       duodenum.      There was no evidence of significant pathology in the entire examined       portion of jejunum. Impression:            - Normal esophagus.                         - Gastric antral vascular ectasia without bleeding.                         Biopsied.                        - Normal examined duodenum.                        - The examined portion of the jejunum was normal. Recommendation:        - Return patient to hospital ward for ongoing care.                        - Advance diet as tolerated.                        - Await pathology results.                        - Given that GAVE is associated with liver disease,                         would perform basic cirrhosis work up with U/S,                         hepatitis labs, and ceruloplasmin. Will need to                         maximize iron therapy. APC not done on this session as                         GAVE appeared pretty diffuse. Can consider outpatient                         octreotide. Will need f/u with his GI provider to                         consider repeating VCE. Procedure Code(s):     --- Professional ---  [phone], Small intestinal endoscopy, enteroscopy beyond                         second portion of duodenum, not including ileum; with                         biopsy, single or multiple Diagnosis Code(s):     --- Professional ---                        K31.819, Angiodysplasia of stomach and duodenum                         without bleeding                        K92.1, Melena (includes Hematochezia) CPT copyright 2022 [employer]. All rights reserved. The codes documented in this report are preliminary and upon coder review may  be revised to meet current compliance requirements. Eather Colas MD, MD 05/07/2023 11:23:25 AM Number of Addenda: 0 Note Initiated On: 05/07/2023 10:44 AM Estimated Blood Loss:  Estimated blood loss was minimal.      [facility]

## 2023-05-07 NOTE — Progress Notes (Signed)
  Progress Note   Patient: Jeremy [name] ZOX:[id number] DOB: Sep 18, 1951 DOA: 05/06/2023     1 DOS: the patient was seen and examined on 05/07/2023   Brief hospital course: Mr. Zakariya Oherron is a 72 year old male with history of iron deficiency anemia presumed secondary to chronic blood loss in setting of possible venous malformation and Chronic gastritis, CKD stage II/IIIa, hypertension, insulin-dependent diabetes mellitus, depression, hyperlipidemia, who presents to the emergency department for chief concerns of weakness. Hemoglobin was 6.7, received blood transfusion.  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    Principal Problem:   Symptomatic anemia Active Problems:   Chronic blood loss anemia   Arteriovenous malformation (AVM)   Type 2 diabetes mellitus with diabetic polyneuropathy, with long-term current use of insulin (HCC)   Stage 3a chronic kidney disease (HCC)   COPD (chronic obstructive pulmonary disease) (HCC)   Multiple sclerosis (HCC)   Essential hypertension   Hyperlipidemia   Depression   Gastroesophageal reflux disease without esophagitis   Primary pulmonary hypertension (HCC)   Anemia in chronic kidney disease (CKD)   Elevated troponin   CKD (chronic kidney disease) stage 2, GFR 60-89 ml/min   Assessment and Plan:  * Symptomatic anemia Gastric 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 Patient had received transfusion, will continue to check hemoglobin to make sure stability before discharge.  Type 2 diabetes mellitus with diabetic polyneuropathy, with long-term current use of insulin (HCC) Continue sliding scale insulin, long-acting insulin on hold, glucose not significant elevated.  Stage 3a chronic kidney disease (HCC) At baseline  COPD (chronic obstructive pulmonary disease)  (HCC) Stable.  Elevated troponin Suspect secondary to acute on chronic blood loss anemia  Depression Sertraline 25 mg daily resumed for 7/13  Hyperlipidemia Simvastatin 40 mg every evening resumed  Essential hypertension Continue losartan.      Subjective:  Patient doing well today, no nausea vomiting abdominal pain.  Physical Exam: Vitals:   05/07/23 0745 05/07/23 1057 05/07/23 1118 05/07/23 1145  BP: (!) 141/55 (!) 158/60 (!) 134/46 (!) 143/62  Pulse: 77 80 87 81  Resp: 15 18    Temp: 97.6 F (36.4 C) (!) 97 F (36.1 C) 98.3 F (36.8 C)   TempSrc: Oral Temporal    SpO2: 96% 100%    Weight:  88.9 kg    Height:  5\' 10"  (1.778 m)     General exam: Appears calm and comfortable  Respiratory system: Clear to auscultation. Respiratory effort normal. Cardiovascular system: S1 & S2 heard, RRR. No JVD, murmurs, rubs, gallops or clicks. No pedal edema. Gastrointestinal system: Abdomen is nondistended, soft and nontender. No organomegaly or masses felt. Normal bowel sounds heard. Central nervous system: Alert and oriented. No focal neurological deficits. Extremities: Symmetric 5 x 5 power. Skin: No rashes, lesions or ulcers Psychiatry: Judgement and insight appear normal. Mood & affect appropriate.    Data Reviewed:  Lab results reviewed.  Family Communication: Wife updated at bedside.  Disposition: Status is: Inpatient Remains inpatient appropriate because: Severity of disease,     Time spent: 35 minutes  Author: Marrion Coy, MD 05/07/2023 12:23 PM  For on call review [email or web address].

## 2023-05-07 NOTE — Transfer of Care (Signed)
Immediate Anesthesia Transfer of Care Note  Patient: Jeremy [name]  Procedure(s) Performed: ENTEROSCOPY BIOPSY  Patient Location: PACU  Anesthesia Type:MAC  Level of Consciousness: awake, alert , oriented, and patient cooperative  Airway & Oxygen Therapy: Patient Spontanous Breathing and non-rebreather face mask  Post-op Assessment: Report given to RN and Patient moving all extremities X 4  Post vital signs: stable  Last Vitals:  Vitals Value Taken Time  BP 107/45   Temp 98.52F   Pulse 85   Resp 18   SpO2 97     Last Pain:  Vitals:   05/07/23 1057  TempSrc: Temporal  PainSc: 0-No pain         Complications: No notable events documented.

## 2023-05-07 NOTE — Anesthesia Postprocedure Evaluation (Signed)
Anesthesia Post Note  Patient: Jonahs Heckman  Procedure(s) Performed: ENTEROSCOPY BIOPSY  Patient location during evaluation: PACU Anesthesia Type: General Level of consciousness: awake and alert Pain management: pain level controlled Vital Signs Assessment: post-procedure vital signs reviewed and stable Respiratory status: spontaneous breathing, nonlabored ventilation, respiratory function stable and patient connected to nasal cannula oxygen Cardiovascular status: blood pressure returned to baseline and stable Postop Assessment: no apparent nausea or vomiting Anesthetic complications: no   No notable events documented.   Last Vitals:  Vitals:   05/07/23 1118 05/07/23 1145  BP: (!) 134/46 (!) 143/62  Pulse: 87 81  Resp:    Temp: 36.8 C   SpO2:      Last Pain:  Vitals:   05/07/23 1145  TempSrc:   PainSc: 0-No pain                 Foye Deer

## 2023-05-07 NOTE — ED Notes (Signed)
Patient's wife requested to give patient his betaseron 0.3mg  subcutaneous injection for his MS. Per MD Chipper Herb, okay for wife to administer injection.

## 2023-05-08 ENCOUNTER — Inpatient Hospital Stay: Payer: Medicare Other

## 2023-05-08 DIAGNOSIS — I5033 Acute on chronic diastolic (congestive) heart failure: Secondary | ICD-10-CM

## 2023-05-08 DIAGNOSIS — J9621 Acute and chronic respiratory failure with hypoxia: Secondary | ICD-10-CM

## 2023-05-08 DIAGNOSIS — D649 Anemia, unspecified: Secondary | ICD-10-CM | POA: Diagnosis not present

## 2023-05-08 DIAGNOSIS — K7460 Unspecified cirrhosis of liver: Secondary | ICD-10-CM | POA: Insufficient documentation

## 2023-05-08 LAB — CBC
HCT: 27.4 % — ABNORMAL LOW (ref 39.0–52.0)
Hemoglobin: 8.4 g/dL — ABNORMAL LOW (ref 13.0–17.0)
MCH: 28.8 pg (ref 26.0–34.0)
MCHC: 30.7 g/dL (ref 30.0–36.0)
MCV: 93.8 fL (ref 80.0–100.0)
Platelets: 229 10*3/uL (ref 150–400)
RBC: 2.92 MIL/uL — ABNORMAL LOW (ref 4.22–5.81)
RDW: 20.9 % — ABNORMAL HIGH (ref 11.5–15.5)
WBC: 6.8 10*3/uL (ref 4.0–10.5)
nRBC: 0 % (ref 0.0–0.2)

## 2023-05-08 LAB — PROCALCITONIN: Procalcitonin: <0.1 ng/mL

## 2023-05-08 LAB — GLUCOSE, CAPILLARY
Glucose-Capillary: 102 mg/dL — ABNORMAL HIGH (ref 70–99)
Glucose-Capillary: 171 mg/dL — ABNORMAL HIGH (ref 70–99)
Glucose-Capillary: 145 mg/dL — ABNORMAL HIGH (ref 70–99)

## 2023-05-08 LAB — HEMOGLOBIN: Hemoglobin: 7.9 g/dL — ABNORMAL LOW (ref 13.0–17.0)

## 2023-05-08 LAB — SARS CORONAVIRUS 2 BY RT PCR: SARS Coronavirus 2 by RT PCR: NEGATIVE

## 2023-05-08 MED ORDER — PANTOPRAZOLE SODIUM 40 MG PO TBEC
40.0000 mg | DELAYED_RELEASE_TABLET | Freq: Two times a day (BID) | ORAL | Status: DC
  Administered 2023-05-09 – 2023-05-20 (×23): 40 mg via ORAL
  Filled 2023-05-08 (×23): qty 1

## 2023-05-08 MED ORDER — POLYSACCHARIDE IRON COMPLEX 150 MG PO CAPS
150.0000 mg | ORAL_CAPSULE | Freq: Every day | ORAL | Status: DC
  Administered 2023-05-08 – 2023-05-09 (×2): 150 mg via ORAL
  Filled 2023-05-08 (×2): qty 1

## 2023-05-08 MED ORDER — LIDOCAINE 5 % EX PTCH
1.0000 | MEDICATED_PATCH | CUTANEOUS | Status: DC
  Administered 2023-05-08 – 2023-05-16 (×8): 1 via TRANSDERMAL
  Filled 2023-05-08 (×13): qty 1

## 2023-05-08 MED ORDER — PREGABALIN 75 MG PO CAPS
200.0000 mg | ORAL_CAPSULE | Freq: Two times a day (BID) | ORAL | Status: DC
  Administered 2023-05-08 – 2023-05-09 (×2): 200 mg via ORAL
  Filled 2023-05-08 (×2): qty 1

## 2023-05-08 MED ORDER — FUROSEMIDE 10 MG/ML IJ SOLN
40.0000 mg | Freq: Once | INTRAMUSCULAR | Status: AC
  Administered 2023-05-08: 40 mg via INTRAVENOUS
  Filled 2023-05-08: qty 4

## 2023-05-08 MED ORDER — SODIUM CHLORIDE 0.9 % IV SOLN
INTRAVENOUS | Status: DC | PRN
  Administered 2023-05-15: 10 mL/h via INTRAVENOUS

## 2023-05-08 NOTE — Progress Notes (Signed)
Returned from x-ray in stable condition.

## 2023-05-08 NOTE — Progress Notes (Addendum)
Spiked a fever 100.5, obtain UA, CBC, blood cultures, CXR, and procal.

## 2023-05-08 NOTE — Progress Notes (Signed)
GI Inpatient Follow-up Note  Subjective:  Patient seen and doing ok, appears short of breath. Hemoglobin stable. U/S suggestive of cirrhosis  Scheduled Inpatient Medications:   albuterol  2.5 mg Nebulization QID   cyanocobalamin  1,000 mcg Oral Daily   insulin aspart  0-5 Units Subcutaneous QHS   insulin aspart  0-9 Units Subcutaneous TID WC   lidocaine  1 patch Transdermal Q24H   pantoprazole  40 mg Oral BID AC   pregabalin  200 mg Oral BID   sertraline  25 mg Oral Daily   simvastatin  40 mg Oral q1800    Continuous Inpatient Infusions:    sodium chloride 10 mL/hr at 05/08/23 0043    PRN Inpatient Medications:  sodium chloride, acetaminophen **OR** acetaminophen, hydrALAZINE, ipratropium-albuterol, ondansetron **OR** ondansetron (ZOFRAN) IV, senna-docusate, traMADol  Review of Systems:  Review of Systems  Constitutional:  Negative for chills and fever.  Respiratory:  Positive for shortness of breath.   Cardiovascular:  Negative for chest pain.  Gastrointestinal:  Negative for abdominal pain, blood in stool, constipation and melena.  Musculoskeletal:  Negative for joint pain.  Skin:  Negative for rash.  Neurological:  Negative for focal weakness.  Psychiatric/Behavioral:  Negative for substance abuse.   All other systems reviewed and are negative.     Physical Examination: BP (!) 126/46 (BP Location: Left Arm)   Pulse 95   Temp 99.6 F (37.6 C) (Oral)   Resp 16   Ht 5\' 10"  (1.778 m)   Wt 88.9 kg   SpO2 91%   BMI 28.12 kg/m  Gen: NAD, alert and oriented x 4 HEENT: PEERLA, EOMI, Neck: supple, no JVD or thyromegaly Chest: appears mildly short of breath Abd: soft, non-tender, non-distended Ext: no edema, well perfused with 2+ pulses, Skin: no rash or lesions noted Lymph: no LAD  Data: Lab Results  Component Value Date   WBC 6.5 05/07/2023   HGB 7.9 (L) 05/08/2023   HCT 24.7 (L) 05/07/2023   MCV 94.3 05/07/2023   PLT 226 05/07/2023   Recent Labs  Lab  05/07/23 1741 05/07/23 2332 05/08/23 0521  HGB 8.6* 8.0* 7.9*   Lab Results  Component Value Date   NA 140 05/07/2023   K 3.9 05/07/2023   CL 109 05/07/2023   CO2 25 05/07/2023   BUN 29 (H) 05/07/2023   CREATININE 1.00 05/07/2023   GLU 94 03/30/2021   Lab Results  Component Value Date   ALT 10 05/06/2023   AST 22 05/06/2023   ALKPHOS 76 05/06/2023   BILITOT 0.9 05/06/2023   Recent Labs  Lab 05/06/23 1120  INR 1.3*   Assessment/Plan: Jeremy [name] is a 72 y.o. 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  Merlyn Lot MD, MPH [facility] GI

## 2023-05-08 NOTE — Progress Notes (Signed)
Patient and wife updated on POC. Nasal swab obtained. Pt to x-ray in stable condition via bed.

## 2023-05-08 NOTE — Progress Notes (Signed)
Progress Note   Patient: Jeremy [name] ZOX:[id number] DOB: [birth year] DOA: 05/06/2023     2 DOS: the patient was seen and examined on 05/08/2023   Brief hospital course: Jeremy [name] is a 72 year old male with history of iron deficiency anemia presumed secondary to chronic blood loss in setting of possible venous malformation and Chronic gastritis, CKD stage II/IIIa, hypertension, insulin-dependent diabetes mellitus, depression, hyperlipidemia, who presents to the emergency department for chief concerns of weakness. Hemoglobin was 6.7, received blood transfusion.  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  Patient had increased short of breath in the morning of 7/14, gave dose of IV Lasix.   Principal Problem:   Symptomatic anemia Active Problems:   Chronic blood loss anemia   Acute on chronic respiratory failure with hypoxia (HCC)   Arteriovenous malformation (AVM)   Type 2 diabetes mellitus with diabetic polyneuropathy, with long-term current use of insulin (HCC)   Stage 3a chronic kidney disease (HCC)   COPD (chronic obstructive pulmonary disease) (HCC)   Multiple sclerosis (HCC)   Essential hypertension   Hyperlipidemia   Depression   Gastroesophageal reflux disease without esophagitis   Primary pulmonary hypertension (HCC)   Anemia in chronic kidney disease (CKD)   Elevated troponin   CKD (chronic kidney disease) stage 2, GFR 60-89 ml/min   Cirrhosis of liver without ascites (HCC)   Acute on chronic diastolic CHF (congestive heart failure) (HCC)   Assessment and Plan: Acute on chronic hypoxemic respiratory failure. Acute on chronic diastolic congestive heart failure. Patient is chronically on 2 L oxygen at home for COPD.  He had a worsening short of breath this morning, oxygen saturations dropped down to 84%, oxygen was increased to 4 L.  BNP elevated at 704.  Patient also had evidence of volume overload, does not have  a record of echocardiogram.  Will obtain echocardiogram, give IV Lasix x 1.  Continue to follow closely.   Symptomatic anemia Gastric Arteriovenous malformation (AVM) Liver cirrhosis. 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 Right upper ultrasound showed liver cirrhosis. Patient hemoglobin appears to be improving.  Continue to follow.   Type 2 diabetes mellitus with diabetic polyneuropathy, with long-term current use of insulin (HCC) Glucose still stable, continue sliding scale insulin for now.   Stage 3a chronic kidney disease (HCC) At baseline   COPD (chronic obstructive pulmonary disease) (HCC) Stable.   Elevated troponin Suspect secondary to acute on chronic blood loss anemia   Depression Sertraline 25 mg daily resumed for 7/13   Hyperlipidemia Simvastatin 40 mg every evening resumed   Essential hypertension Continue losartan.        Subjective:  Patient has worsening short of breath this morning, had a worsening hypoxia.  Minimal cough.  Physical Exam: Vitals:   05/08/23 0024 05/08/23 0802 05/08/23 0805 05/08/23 0825  BP: (!) 113/48 (!) 149/48    Pulse: 79 85    Resp: 20 16    Temp: 99.4 F (37.4 C) 99.3 F (37.4 C)    TempSrc: Oral Oral    SpO2: 91% (!) 84% (!) 88% 90%  Weight:      Height:       General exam: Appears calm and comfortable  Respiratory system: Crackles in the bases bilaterally. Respiratory effort normal. Cardiovascular system: S1 & S2 heard, RRR. No JVD, murmurs, rubs, gallops or clicks. No pedal edema. Gastrointestinal system: Abdomen is nondistended, soft and  nontender. No organomegaly or masses felt. Normal bowel sounds heard. Central nervous system: Alert and oriented x3. No focal neurological deficits. Extremities: Symmetric 5 x 5 power. Skin: No rashes, lesions or ulcers Psychiatry: Judgement and insight appear normal. Mood & affect appropriate.    Data  Reviewed:  Ultrasound results reviewed, lab results reviewed.  Family Communication: Wife updated at bedside.  Disposition: Status is: Inpatient Remains inpatient appropriate because: Severity of disease, IV treatment.     Time spent: 50 minutes  Author: Marrion Coy, MD 05/08/2023 9:50 AM  For on call review [email or web address].

## 2023-05-09 ENCOUNTER — Inpatient Hospital Stay (HOSPITAL_COMMUNITY)
Admission: EM | Admit: 2023-05-09 | Discharge: 2023-05-09 | Disposition: A | Discharge status: Home / Self Care | Payer: Medicare Other | Source: Home / Self Care | Attending: Internal Medicine | Admitting: Internal Medicine

## 2023-05-09 ENCOUNTER — Inpatient Hospital Stay
Admission: EM | Admit: 2023-05-09 | Discharge: 2023-05-09 | Disposition: A | Discharge status: Home / Self Care | Payer: Medicare Other | Source: Home / Self Care | Attending: Internal Medicine | Admitting: Internal Medicine

## 2023-05-09 ENCOUNTER — Encounter: Payer: Self-pay | Admitting: Gastroenterology

## 2023-05-09 DIAGNOSIS — K922 Gastrointestinal hemorrhage, unspecified: Secondary | ICD-10-CM

## 2023-05-09 DIAGNOSIS — I272 Pulmonary hypertension, unspecified: Secondary | ICD-10-CM | POA: Insufficient documentation

## 2023-05-09 DIAGNOSIS — J9621 Acute and chronic respiratory failure with hypoxia: Secondary | ICD-10-CM | POA: Diagnosis not present

## 2023-05-09 DIAGNOSIS — I5031 Acute diastolic (congestive) heart failure: Secondary | ICD-10-CM | POA: Diagnosis not present

## 2023-05-09 DIAGNOSIS — I5033 Acute on chronic diastolic (congestive) heart failure: Secondary | ICD-10-CM | POA: Diagnosis not present

## 2023-05-09 DIAGNOSIS — D649 Anemia, unspecified: Secondary | ICD-10-CM | POA: Diagnosis not present

## 2023-05-09 DIAGNOSIS — D500 Iron deficiency anemia secondary to blood loss (chronic): Secondary | ICD-10-CM | POA: Diagnosis not present

## 2023-05-09 DIAGNOSIS — N189 Chronic kidney disease, unspecified: Secondary | ICD-10-CM | POA: Diagnosis not present

## 2023-05-09 LAB — ECHOCARDIOGRAM COMPLETE
AR max vel: 2.58 cm2
AV Area VTI: 2.96 cm2
AV Area mean vel: 2.75 cm2
AV Mean grad: 4.5 mmHg
AV Peak grad: 8.5 mmHg
Ao pk vel: 1.46 m/s
Area-P 1/2: 3.24 cm2
Height: 70 in
S' Lateral: 3.6 cm
Weight: 3135.82 oz

## 2023-05-09 LAB — URINALYSIS, W/ REFLEX TO CULTURE (INFECTION SUSPECTED)
Bacteria, UA: NONE SEEN
Bilirubin Urine: NEGATIVE
Glucose, UA: NEGATIVE mg/dL
Ketones, ur: NEGATIVE mg/dL
Leukocytes,Ua: NEGATIVE
Nitrite: NEGATIVE
Protein, ur: 100 mg/dL — AB
Specific Gravity, Urine: 1.016 (ref 1.005–1.030)
pH: 5 (ref 5.0–8.0)

## 2023-05-09 LAB — CBC
HCT: 25 % — ABNORMAL LOW (ref 39.0–52.0)
Hemoglobin: 7.6 g/dL — ABNORMAL LOW (ref 13.0–17.0)
MCH: 28.6 pg (ref 26.0–34.0)
MCHC: 30.4 g/dL (ref 30.0–36.0)
MCV: 94 fL (ref 80.0–100.0)
Platelets: 219 10*3/uL (ref 150–400)
RBC: 2.66 MIL/uL — ABNORMAL LOW (ref 4.22–5.81)
RDW: 20.5 % — ABNORMAL HIGH (ref 11.5–15.5)
WBC: 5.4 10*3/uL (ref 4.0–10.5)
nRBC: 0 % (ref 0.0–0.2)

## 2023-05-09 LAB — GLUCOSE, CAPILLARY
Glucose-Capillary: 83 mg/dL (ref 70–99)
Glucose-Capillary: 94 mg/dL (ref 70–99)
Glucose-Capillary: 153 mg/dL — ABNORMAL HIGH (ref 70–99)
Glucose-Capillary: 149 mg/dL — ABNORMAL HIGH (ref 70–99)
Glucose-Capillary: 150 mg/dL — ABNORMAL HIGH (ref 70–99)

## 2023-05-09 LAB — BASIC METABOLIC PANEL
Anion gap: 4 — ABNORMAL LOW (ref 5–15)
BUN: 29 mg/dL — ABNORMAL HIGH (ref 8–23)
CO2: 26 mmol/L (ref 22–32)
Calcium: 7.7 mg/dL — ABNORMAL LOW (ref 8.9–10.3)
Chloride: 109 mmol/L (ref 98–111)
Creatinine, Ser: 1.18 mg/dL (ref 0.61–1.24)
GFR, Estimated: >60 mL/min (ref >60)
Glucose, Bld: 93 mg/dL (ref 70–99)
Potassium: 3.6 mmol/L (ref 3.5–5.1)
Sodium: 139 mmol/L (ref 135–145)

## 2023-05-09 LAB — RETIC PANEL
Immature Retic Fract: 35.9 % — ABNORMAL HIGH (ref 2.3–15.9)
RBC.: 2.6 MIL/uL — ABNORMAL LOW (ref 4.22–5.81)
Retic Count, Absolute: 159.1 10*3/uL (ref 19.0–186.0)
Retic Ct Pct: 6.1 % — ABNORMAL HIGH (ref 0.4–3.1)
Reticulocyte Hemoglobin: 26.2 pg — ABNORMAL LOW (ref >27.9)

## 2023-05-09 LAB — PROCALCITONIN: Procalcitonin: <0.1 ng/mL

## 2023-05-09 LAB — MAGNESIUM: Magnesium: 2.1 mg/dL (ref 1.7–2.4)

## 2023-05-09 MED ORDER — EPOETIN ALFA 10000 UNIT/ML IJ SOLN
20000.0000 [IU] | Freq: Once | INTRAMUSCULAR | Status: AC
  Administered 2023-05-09: 20000 [IU] via SUBCUTANEOUS
  Filled 2023-05-09: qty 2

## 2023-05-09 MED ORDER — PREGABALIN 75 MG PO CAPS
200.0000 mg | ORAL_CAPSULE | Freq: Two times a day (BID) | ORAL | Status: DC
  Administered 2023-05-09 – 2023-05-20 (×22): 200 mg via ORAL
  Filled 2023-05-09 (×22): qty 1

## 2023-05-09 MED ORDER — FUROSEMIDE 40 MG PO TABS
40.0000 mg | ORAL_TABLET | Freq: Every day | ORAL | Status: DC
  Administered 2023-05-09: 40 mg via ORAL
  Filled 2023-05-09: qty 1

## 2023-05-09 MED ORDER — ALBUTEROL SULFATE (2.5 MG/3ML) 0.083% IN NEBU: 2.5000 mg | INHALATION_SOLUTION | RESPIRATORY_TRACT | Status: DC | PRN

## 2023-05-09 MED ORDER — EPOETIN ALFA-EPBX 10000 UNIT/ML IJ SOLN: 20000.0000 [IU] | Freq: Once | INTRAMUSCULAR | Status: DC

## 2023-05-09 MED ORDER — SODIUM CHLORIDE 0.9 % IV SOLN
200.0000 mg | Freq: Once | INTRAVENOUS | Status: AC
  Administered 2023-05-10: 200 mg via INTRAVENOUS
  Filled 2023-05-09: qty 200

## 2023-05-09 MED ORDER — SODIUM CHLORIDE 0.9 % IV SOLN
300.0000 mg | Freq: Once | INTRAVENOUS | Status: AC
  Administered 2023-05-09: 300 mg via INTRAVENOUS
  Filled 2023-05-09: qty 300

## 2023-05-09 MED ORDER — IPRATROPIUM-ALBUTEROL 0.5-2.5 (3) MG/3ML IN SOLN
3.0000 mL | Freq: Three times a day (TID) | RESPIRATORY_TRACT | Status: DC
  Administered 2023-05-09 – 2023-05-20 (×32): 3 mL via RESPIRATORY_TRACT
  Filled 2023-05-09 (×31): qty 3

## 2023-05-09 NOTE — Plan of Care (Signed)
  Problem: Education: Goal: Ability to describe self-care measures that may prevent or decrease complications (Diabetes Survival Skills Education) will improve Outcome: Progressing   Problem: Coping: Goal: Ability to adjust to condition or change in health will improve Outcome: Progressing   Problem: Health Behavior/Discharge Planning: Goal: Ability to identify and utilize available resources and services will improve Outcome: Progressing   Problem: Nutritional: Goal: Progress toward achieving an optimal weight will improve Outcome: Progressing   Problem: Skin Integrity: Goal: Risk for impaired skin integrity will decrease Outcome: Progressing

## 2023-05-09 NOTE — Progress Notes (Signed)
*  PRELIMINARY RESULTS* Echocardiogram 2D Echocardiogram has been performed.  Cristela Blue 05/09/2023, 10:06 AM

## 2023-05-09 NOTE — Progress Notes (Signed)
Hematology/Oncology Progress note [phone] [phone]     Patient Care Team: Duanne Limerick, MD as PCP - General (Family Medicine) Levert Feinstein, MD as Consulting Physician (Neurology) Gwyneth Revels, DPM as Consulting Physician (Podiatry) Sherlon Handing, MD as Consulting Physician (Internal Medicine) Jasper Riling, MD as Consulting Physician (Nephrology) Rickard Patience, MD as Consulting Physician (Oncology) Kemper Durie, RN as [facility] Care Management   Name of the patient: Jeremy [name]  [id number]  09-Mar-1951  Date of visit: 05/09/23   INTERVAL HISTORY-   Status post EGD on 05/07/2023.-Nonbleeding gastric antral vascular ectasia Hemoglobin today 7.6 And has increased shortness of breath today, was given Lasix.  Low-grade fever 100.5. Chest x-ray showed  increased right lower lobe airspace opacity with blunted right lateral costophrenic angle favoring pleural effusion and pneumonia/atelectasis.  Stable right upper lobe nodule   Allergies  Allergen Reactions   Betadine [Povidone Iodine]     Patient Active Problem List   Diagnosis Date Noted   Iron deficiency anemia due to chronic blood loss 06/19/2021    Priority: High   Arteriovenous malformation (AVM) 10/21/2022    Priority: Medium    Anemia in chronic kidney disease (CKD) 06/19/2021    Priority: Medium    Moderate pulmonary hypertension (HCC) 05/09/2023   Cirrhosis of liver without ascites (HCC) 05/08/2023   Acute on chronic diastolic CHF (congestive heart failure) (HCC) 05/08/2023   CKD (chronic kidney disease) stage 2, GFR 60-89 ml/min 05/07/2023   Symptomatic anemia 05/06/2023   Elevated troponin 05/06/2023   GI bleed 11/24/2022   Weakness 08/18/2022   AKI (acute kidney injury) (HCC) 08/18/2022   Severe sepsis (HCC) 02/04/2022   Pneumonia 02/03/2022   Acute on chronic respiratory failure with hypoxia (HCC) 02/03/2022   Hyperkalemia 02/03/2022   COPD (chronic  obstructive pulmonary disease) (HCC) 02/03/2022   SIRS (systemic inflammatory response syndrome), possible sepsis (HCC) 02/03/2022   Chronic blood loss anemia 09/24/2021   Gastric polyp    Gastritis without bleeding    Gait abnormality 05/07/2021   Stage 3a chronic kidney disease (HCC) 05/20/2020   DM type 2 with diabetic peripheral neuropathy (HCC) 05/06/2020   Idiopathic peripheral neuropathy 01/15/2020   Generalized edema 01/15/2020   Primary pulmonary hypertension (HCC) 10/28/2019   Therapeutic drug monitoring 03/23/2018   Mild episode of recurrent major depressive disorder (HCC) 09/26/2017   Ventricular ectopic beats 09/26/2017   Type 2 diabetes mellitus with diabetic polyneuropathy, with long-term current use of insulin (HCC) 08/12/2017   Hyperlipidemia due to type 2 diabetes mellitus (HCC) 08/12/2017   B12 deficiency 12/14/2016   Low back pain 03/10/2016   Lumbosacral disc disease 01/01/2016   Neuropathy associated with endocrine disorder (HCC) 11/19/2015   Iron deficiency anemia 06/03/2015   Essential hypertension 06/03/2015   Hyperlipidemia 06/03/2015   Depression 06/03/2015   Gastroesophageal reflux disease without esophagitis 06/03/2015   Edema extremities 06/03/2015   Multiple sclerosis (HCC) 07/26/2013   Abnormality of gait 07/26/2013   Morbid obesity (HCC) 07/26/2013     Past Medical History:  Diagnosis Date   Chronic pain    Depression    Diabetes (HCC)    GERD (gastroesophageal reflux disease)    GI bleed 12/2022   Hyperlipemia    Hypertension    MS (multiple sclerosis) (HCC)      Past Surgical History:  Procedure Laterality Date   BIOPSY  05/07/2023   Procedure: BIOPSY;  Surgeon: Regis Bill, MD;  Location: [facility] ENDOSCOPY;  Service: Endoscopy;;  COLECTOMY  06-2008   COLONOSCOPY  2015   normal   COLONOSCOPY WITH PROPOFOL N/A 09/10/2021   Procedure: COLONOSCOPY WITH PROPOFOL;  Surgeon: Midge Minium, MD;  Location: [facility] ENDOSCOPY;  Service:  Endoscopy;  Laterality: N/A;   ENTEROSCOPY N/A 05/07/2023   Procedure: ENTEROSCOPY;  Surgeon: Regis Bill, MD;  Location: [facility] ENDOSCOPY;  Service: Endoscopy;  Laterality: N/A;   ESOPHAGOGASTRODUODENOSCOPY (EGD) WITH PROPOFOL N/A 09/10/2021   Procedure: ESOPHAGOGASTRODUODENOSCOPY (EGD) WITH PROPOFOL;  Surgeon: Midge Minium, MD;  Location: [facility] ENDOSCOPY;  Service: Endoscopy;  Laterality: N/A;   GIVENS CAPSULE STUDY N/A 09/25/2021   Procedure: GIVENS CAPSULE STUDY;  Surgeon: Wyline Mood, MD;  Location: [facility] ENDOSCOPY;  Service: Gastroenterology;  Laterality: N/A;    Social History   Socioeconomic History   Marital status: Married    Spouse name: Olegario Messier   Number of children: 2   Years of education: GED   Highest education level: Not on file  Occupational History    Comment: Disabled  Tobacco Use   Smoking status: Former    Current packs/day: 0.00    Average packs/day: 1.5 packs/day for 30.0 years (45.0 ttl pk-yrs)    Types: Cigarettes    Start date: 01/24/1976    Quit date: 01/23/2006    Years since quitting: 17.3   Smokeless tobacco: Never   Tobacco comments:    N/A  Vaping Use   Vaping status: Never Used  Substance and Sexual Activity   Alcohol use: Not Currently    Comment: rare; maybe 2 beers a year   Drug use: No   Sexual activity: Not Currently  Other Topics Concern   Not on file  Social History Narrative   Patient is disabled.    Patient lives with his wife Versie Fleener.    Patient has 2 children.       Social Determinants of Health   Financial Resource Strain: Low Risk  (11/19/2022)   Overall Financial Resource Strain (CARDIA)    Difficulty of Paying Living Expenses: Not hard at all  Food Insecurity: No Food Insecurity (05/07/2023)   Hunger Vital Sign    Worried About Running Out of Food in the Last Year: Never true    Ran Out of Food in the Last Year: Never true  Transportation Needs: No Transportation Needs (05/07/2023)   PRAPARE - Doctor, general practice (Medical): No    Lack of Transportation (Non-Medical): No  Physical Activity: Insufficiently Active (11/19/2022)   Exercise Vital Sign    Days of Exercise per Week: 3 days    Minutes of Exercise per Session: 20 min  Stress: No Stress Concern Present (11/19/2022)   [employer] of Occupational Health - Occupational Stress Questionnaire    Feeling of Stress : Not at all  Social Connections: Moderately Isolated (11/19/2022)   Social Connection and Isolation Panel [NHANES]    Frequency of Communication with Friends and Family: More than three times a week    Frequency of Social Gatherings with Friends and Family: More than three times a week    Attends Religious Services: Never    Database administrator or Organizations: No    Attends Banker Meetings: Never    Marital Status: Married  Catering manager Violence: Not At Risk (05/07/2023)   Humiliation, Afraid, Rape, and Kick questionnaire    Fear of Current or Ex-Partner: No    Emotionally Abused: No    Physically Abused: No    Sexually Abused: No  Family History  Problem Relation Age of Onset   Lung cancer Mother    Heart attack Father    Heart attack Brother    COPD Brother    Diabetes Brother    Esophageal cancer Nephew      Current Facility-Administered Medications:    0.9 %  sodium chloride infusion, , Intravenous, PRN, Marrion Coy, MD, Last Rate: 10 mL/hr at 05/08/23 0043, New Bag at 05/08/23 0043   acetaminophen (TYLENOL) tablet 650 mg, 650 mg, Oral, Q6H PRN, 650 mg at 05/08/23 1450 **OR** acetaminophen (TYLENOL) suppository 650 mg, 650 mg, Rectal, Q6H PRN, Cox, Amy N, DO   albuterol (PROVENTIL) (2.5 MG/3ML) 0.083% nebulizer solution 2.5 mg, 2.5 mg, Nebulization, Q4H PRN, Marrion Coy, MD   cyanocobalamin (VITAMIN B12) tablet 1,000 mcg, 1,000 mcg, Oral, Daily, Cox, Amy N, DO, 1,000 mcg at 05/09/23 0948   furosemide (LASIX) tablet 40 mg, 40 mg, Oral, Daily, Marrion Coy, MD, 40 mg at  05/09/23 1305   hydrALAZINE (APRESOLINE) injection 5 mg, 5 mg, Intravenous, Q8H PRN, Cox, Amy N, DO   insulin aspart (novoLOG) injection 0-5 Units, 0-5 Units, Subcutaneous, QHS, Cox, Amy N, DO   insulin aspart (novoLOG) injection 0-9 Units, 0-9 Units, Subcutaneous, TID WC, Cox, Amy N, DO, 2 Units at 05/09/23 1305   ipratropium-albuterol (DUONEB) 0.5-2.5 (3) MG/3ML nebulizer solution 3 mL, 3 mL, Nebulization, TID, Marrion Coy, MD, 3 mL at 05/09/23 1427   lidocaine (LIDODERM) 5 % 1 patch, 1 patch, Transdermal, Q24H, Marrion Coy, MD, 1 patch at 05/09/23 0948   ondansetron (ZOFRAN) tablet 4 mg, 4 mg, Oral, Q6H PRN **OR** ondansetron (ZOFRAN) injection 4 mg, 4 mg, Intravenous, Q6H PRN, Cox, Amy N, DO   pantoprazole (PROTONIX) EC tablet 40 mg, 40 mg, Oral, BID AC, Marrion Coy, MD, 40 mg at 05/09/23 0947   pregabalin (LYRICA) capsule 200 mg, 200 mg, Oral, BID, Mansy, Jan A, MD, 200 mg at 05/09/23 0948   senna-docusate (Senokot-S) tablet 1 tablet, 1 tablet, Oral, QHS PRN, Cox, Amy N, DO   sertraline (ZOLOFT) tablet 25 mg, 25 mg, Oral, Daily, Cox, Amy N, DO, 25 mg at 05/09/23 0947   simvastatin (ZOCOR) tablet 40 mg, 40 mg, Oral, q1800, Cox, Amy N, DO, 40 mg at 05/08/23 2002   traMADol (ULTRAM) tablet 50 mg, 50 mg, Oral, Q6H PRN, Cox, Amy N, DO, 50 mg at 05/09/23 1619  Facility-Administered Medications Ordered in Other Encounters:    0.9 %  sodium chloride infusion, , Intravenous, Continuous, Rickard Patience, MD, Stopped at 11/04/22 1432   diphenhydrAMINE (BENADRYL) capsule 25 mg, 25 mg, Oral, Once, Rickard Patience, MD   Physical exam:  Vitals:   05/09/23 0012 05/09/23 0841 05/09/23 1113 05/09/23 1608  BP: (!) 128/47 (!) 142/45 (!) 122/45 (!) 139/47  Pulse: 70  83 (!) 56  Resp: 16 14 18 15   Temp: 99.3 F (37.4 C) 99.4 F (37.4 C)  98.6 F (37 C)  TempSrc: Oral Oral  Oral  SpO2: 95% 91% (!) 87% 91%  Weight:      Height:       Physical Exam Constitutional:      General: He is not in acute distress.     Appearance: He is not diaphoretic.  HENT:     Head: Normocephalic and atraumatic.  Eyes:     General: No scleral icterus.    Pupils: Pupils are equal, round, and reactive to light.  Cardiovascular:     Rate and Rhythm: Normal rate.  Pulmonary:  Effort: Pulmonary effort is normal. No respiratory distress.     Comments: On nasal cannula oxygen Decreased breath sound bilaterally Abdominal:     General: There is no distension.     Palpations: Abdomen is soft.  Musculoskeletal:        General: Normal range of motion.     Cervical back: Normal range of motion and neck supple.  Skin:    General: Skin is warm and dry.     Findings: No erythema.  Neurological:     Mental Status: He is alert and oriented to person, place, and time. Mental status is at baseline.     Cranial Nerves: No cranial nerve deficit.     Motor: No abnormal muscle tone.  Psychiatric:        Mood and Affect: Mood and affect normal.       Labs    Latest Ref Rng & Units 05/09/2023    3:31 AM 05/08/2023    3:12 PM 05/08/2023    5:21 AM  CBC  WBC 4.0 - 10.5 K/uL 5.4  6.8    Hemoglobin 13.0 - 17.0 g/dL 7.6  8.4  7.9   Hematocrit 39.0 - 52.0 % 25.0  27.4    Platelets 150 - 400 K/uL 219  229        Latest Ref Rng & Units 05/09/2023    3:31 AM 05/07/2023    6:07 AM 05/06/2023   11:20 AM  CMP  Glucose 70 - 99 mg/dL 93  92  371   BUN 8 - 23 mg/dL 29  29  32   Creatinine 0.61 - 1.24 mg/dL 6.96  7.89  3.81   Sodium 135 - 145 mmol/L 139  140  140   Potassium 3.5 - 5.1 mmol/L 3.6  3.9  4.0   Chloride 98 - 111 mmol/L 109  109  111   CO2 22 - 32 mmol/L 26  25  23    Calcium 8.9 - 10.3 mg/dL 7.7  7.8  7.9   Total Protein 6.5 - 8.1 g/dL   5.9   Total Bilirubin 0.3 - 1.2 mg/dL   0.9   Alkaline Phos 38 - 126 U/L   76   AST 15 - 41 U/L   22   ALT 0 - 44 U/L   10      RADIOGRAPHIC STUDIES: I have personally reviewed the radiological images as listed and agreed with the findings in the report. ECHOCARDIOGRAM  COMPLETE  Result Date: 05/09/2023    ECHOCARDIOGRAM REPORT   Patient Name:   TARRENCE ENCK Date of Exam: 05/09/2023 Medical Rec #:  [id number]     Height:       70.0 in Accession #:    [id number]    Weight:       196.0 lb Date of Birth:  1950/11/10     BSA:          2.069 m Patient Age:    72 years      BP:           128/47 mmHg Patient Gender: M             HR:           70 bpm. Exam Location:  [facility] Procedure: 2D Echo, Cardiac Doppler and Color Doppler Indications:     CHF---acute diastolic I50.31  History:         Patient has no prior history of Echocardiogram examinations.  Risk Factors:Diabetes, Hypertension and Dyslipidemia.  Sonographer:     Cristela Blue Referring Phys:  [id number] Marrion Coy Diagnosing Phys: Lorine Bears MD  Sonographer Comments: Suboptimal apical window. IMPRESSIONS  1. Left ventricular ejection fraction, by estimation, is 55 to 60%. The left ventricle has normal function. The left ventricle has no regional wall motion abnormalities. Left ventricular diastolic parameters were normal.  2. Right ventricular systolic function is normal. The right ventricular size is normal. There is moderately elevated pulmonary artery systolic pressure.  3. The mitral valve is normal in structure. No evidence of mitral valve regurgitation. No evidence of mitral stenosis.  4. The aortic valve is normal in structure. Aortic valve regurgitation is not visualized. Aortic valve sclerosis is present, with no evidence of aortic valve stenosis. FINDINGS  Left Ventricle: Left ventricular ejection fraction, by estimation, is 55 to 60%. The left ventricle has normal function. The left ventricle has no regional wall motion abnormalities. The left ventricular internal cavity size was normal in size. There is  no left ventricular hypertrophy. Left ventricular diastolic parameters were normal. Right Ventricle: The right ventricular size is normal. No increase in right ventricular wall thickness. Right  ventricular systolic function is normal. There is moderately elevated pulmonary artery systolic pressure. The tricuspid regurgitant velocity is 3.27 m/s, and with an assumed right atrial pressure of 5 mmHg, the estimated right ventricular systolic pressure is 47.8 mmHg. Left Atrium: Left atrial size was normal in size. Right Atrium: Right atrial size was normal in size. Pericardium: There is no evidence of pericardial effusion. Mitral Valve: The mitral valve is normal in structure. No evidence of mitral valve regurgitation. No evidence of mitral valve stenosis. Tricuspid Valve: The tricuspid valve is normal in structure. Tricuspid valve regurgitation is mild . No evidence of tricuspid stenosis. Aortic Valve: The aortic valve is normal in structure. Aortic valve regurgitation is not visualized. Aortic valve sclerosis is present, with no evidence of aortic valve stenosis. Aortic valve mean gradient measures 4.5 mmHg. Aortic valve peak gradient measures 8.5 mmHg. Aortic valve area, by VTI measures 2.96 cm. Pulmonic Valve: The pulmonic valve was normal in structure. Pulmonic valve regurgitation is not visualized. No evidence of pulmonic stenosis. Aorta: The aortic root is normal in size and structure. Venous: The inferior vena cava was not well visualized. IAS/Shunts: No atrial level shunt detected by color flow Doppler.  LEFT VENTRICLE PLAX 2D LVIDd:         5.10 cm   Diastology LVIDs:         3.60 cm   LV e' medial:    13.70 cm/s LV PW:         0.70 cm   LV E/e' medial:  8.0 LV IVS:        1.40 cm   LV e' lateral:   15.30 cm/s LVOT diam:     2.00 cm   LV E/e' lateral: 7.2 LV SV:         79 LV SV Index:   38 LVOT Area:     3.14 cm  RIGHT VENTRICLE RV Basal diam:  4.20 cm RV Mid diam:    4.10 cm LEFT ATRIUM             Index        RIGHT ATRIUM           Index LA diam:        3.70 cm 1.79 cm/m   RA Area:     19.80 cm LA  Vol [facility]):   59.9 ml 28.95 ml/m  RA Volume:   63.10 ml  30.49 ml/m LA Vol (A4C):   54.5 ml  26.34 ml/m LA Biplane Vol: 62.2 ml 30.06 ml/m  AORTIC VALVE AV Area (Vmax):    2.58 cm AV Area (Vmean):   2.75 cm AV Area (VTI):     2.96 cm AV Vmax:           146.00 cm/s AV Vmean:          99.650 cm/s AV VTI:            0.268 m AV Peak Grad:      8.5 mmHg AV Mean Grad:      4.5 mmHg LVOT Vmax:         120.00 cm/s LVOT Vmean:        87.300 cm/s LVOT VTI:          0.253 m LVOT/AV VTI ratio: 0.94  AORTA Ao Root diam: 3.10 cm MITRAL VALVE                TRICUSPID VALVE MV Area (PHT): 3.24 cm     TR Peak grad:   42.8 mmHg MV Decel Time: 234 msec     TR Vmax:        327.00 cm/s MV E velocity: 110.00 cm/s MV A velocity: 99.60 cm/s   SHUNTS MV E/A ratio:  1.10         Systemic VTI:  0.25 m                             Systemic Diam: 2.00 cm Lorine Bears MD Electronically signed by Lorine Bears MD Signature Date/Time: 05/09/2023/10:24:36 AM    Final    DG Chest 2 View  Result Date: 05/08/2023 CLINICAL DATA:  Pneumonia, shortness of breath EXAM: CHEST - 2 VIEW COMPARISON:  05/06/2023 FINDINGS: Improved aeration at the left lung base. Right lower lobe airspace opacity with blunted right lateral costophrenic angle favoring pleural effusion and pneumonia/atelectasis. Stable peripheral 1.3 cm right upper lobe pulmonary nodule, reportedly previously not hypermetabolic on PET-CT. Prior cross-sectional imaging workups have not recommended follow up of this lesion. Heart size is within normal limits on the AP frontal projection. Atherosclerotic calcification of the aortic arch. IMPRESSION: 1. Right lower lobe airspace opacity with blunted right lateral costophrenic angle favoring pleural effusion and pneumonia/atelectasis. 2. Improved aeration at the left lung base. 3. Stable peripheral 1.3 cm right upper lobe pulmonary nodule, reportedly previously not hypermetabolic on PET-CT. Prior cross-sectional imaging workups have not recommended follow up of this lesion. 4.  Aortic Atherosclerosis (ICD10-I70.0). Electronically  Signed   By: Gaylyn Rong M.D.   On: 05/08/2023 19:27   US Abdomen Limited RUQ (LIVER/GB)  Result Date: 05/07/2023 CLINICAL DATA:  Liver cirrhosis. EXAM: ULTRASOUND ABDOMEN LIMITED RIGHT UPPER QUADRANT COMPARISON:  Body CT 02/03/2022 FINDINGS: Gallbladder: No gallbladder wall thickening visualized. No sonographic Murphy sign noted by sonographer. Two calculi are seen within the gallbladder measuring up to 1 cm. Common bile duct: Diameter: 5 mm Liver: No focal lesion identified. Increased parenchymal echogenicity and lobular contour. Portal vein is patent on color Doppler imaging with normal direction of blood flow towards the liver. Other: Trace ascitic fluid.  Right pleural effusion. IMPRESSION: Cirrhotic appearance of the liver. Cholelithiasis without sonographic evidence of cholecystitis. Electronically Signed   By: Ted Mcalpine M.D.   On: 05/07/2023 14:48   DG Chest  Portable 1 View  Result Date: 05/06/2023 CLINICAL DATA:  Shortness of breath EXAM: PORTABLE CHEST 1 VIEW COMPARISON:  Chest radiograph 11/24/2022 FINDINGS: The heart is enlarged.  The upper mediastinal contours are normal There are diffusely increased interstitial markings likely reflecting pulmonary interstitial edema. Previously seen opacity in the left midlung has resolved. There is no focal consolidation. There is a small right pleural effusion. There is no significant left effusion. There is no pneumothorax There is no acute osseous abnormality. IMPRESSION: Cardiomegaly with a small right pleural effusion and pulmonary interstitial edema. Findings may reflect CHF. Electronically Signed   By: Lesia Hausen M.D.   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  Hemoglobin has improved to 7.6.  Status post EGD, nonbleeding GAVE disease I agree with additional dose of IV Venofer.  He is receiving 1 dose today and I  will add another dose tomorrow. I discontinued oral iron supplementation as a well change his stool color, masking GI bleeding. Adding Procrit 20,000 units today.  Continue monitor CBC daily.  # Low-grade fever, pleural effusion, I will defer management to primary team. # Liver cirrhosis, GI is on board.  Thank you for allowing me to participate in the care of this patient.   Rickard Patience, MD, PhD Hematology Oncology 05/09/2023

## 2023-05-09 NOTE — Care Management Important Message (Signed)
Important Message  Patient Details  Name: Jeremy [name] MRN: [id number] Date of Birth: December 30, 1950   Medicare Important Message Given:  N/A - LOS <3 / Initial given by admissions     Olegario Messier A Erikson Danzy 05/09/2023, 9:14 AM

## 2023-05-09 NOTE — Progress Notes (Signed)
Progress Note   Patient: Jeremy [name] ZHY:[id number] DOB: 25-Apr-1951 DOA: 05/06/2023     3 DOS: the patient was seen and examined on 05/09/2023   Brief hospital course: Mr. Fady Stamps is a 72 year old male with history of iron deficiency anemia presumed secondary to chronic blood loss in setting of possible venous malformation and Chronic gastritis, CKD stage II/IIIa, hypertension, insulin-dependent diabetes mellitus, depression, hyperlipidemia, who presents to the emergency department for chief concerns of weakness. Hemoglobin was 6.7, received blood transfusion.  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  Patient had increased short of breath in the morning of 7/14, gave dose of IV Lasix.   Principal Problem:   Symptomatic anemia Active Problems:   Chronic blood loss anemia   Acute on chronic respiratory failure with hypoxia (HCC)   Arteriovenous malformation (AVM)   Type 2 diabetes mellitus with diabetic polyneuropathy, with long-term current use of insulin (HCC)   Stage 3a chronic kidney disease (HCC)   COPD (chronic obstructive pulmonary disease) (HCC)   Multiple sclerosis (HCC)   Essential hypertension   Hyperlipidemia   Depression   Gastroesophageal reflux disease without esophagitis   Primary pulmonary hypertension (HCC)   Anemia in chronic kidney disease (CKD)   Elevated troponin   CKD (chronic kidney disease) stage 2, GFR 60-89 ml/min   Cirrhosis of liver without ascites (HCC)   Acute on chronic diastolic CHF (congestive heart failure) (HCC)   Moderate pulmonary hypertension (HCC)   Assessment and Plan: Acute on chronic hypoxemic respiratory failure. Acute on chronic diastolic congestive heart failure. Patient is chronically on 2 L oxygen at home for COPD.  He had a worsening short of breath this morning, oxygen saturations dropped down to 84%, oxygen was increased to 4 L.  BNP elevated at 704.  Patient also had  evidence of volume overload. Patient is treated with 1 dose IV Lasix, followed by oral Lasix.  Echocardiogram showed moderate pulm hypertension with diastolic dysfunction.  Ejection fraction normal. Condition improving, wean oxygen, change Lasix to oral. Patient has significant weakness, obtain PT/OT evaluation.  Low-grade fever. Patient had a temperature 100.5 yesterday, no recurrence. Urine study today did not show any abnormality.  COVID-negative, procalcitonin level less than 0.1, repeat chest x-ray showed right pleural effusion, reported some right lower lobe consolidation.  It is possible patient had aspiration.  I will repeat procalcitonin level again today.  Hold off antibiotics as patient does not have any respiratory symptoms, no recurrence of fever.    Symptomatic anemia Gastric Arteriovenous malformation (AVM) Liver cirrhosis. 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 Right upper ultrasound showed liver cirrhosis. Anemia most likely secondary to bone marrow suppression from liver disease, iron level is low, will give IV iron.  Continue to follow hemoglobin.   Type 2 diabetes mellitus with diabetic polyneuropathy, with long-term current use of insulin (HCC) Glucose still stable, continue sliding scale insulin for now.   Stage 3a chronic kidney disease (HCC) At baseline   COPD (chronic obstructive pulmonary disease) (HCC) Stable.   Elevated troponin Suspect secondary to acute on chronic blood loss anemia   Depression Sertraline 25 mg daily resumed for 7/13   Hyperlipidemia Simvastatin 40 mg every evening resumed   Essential hypertension Continue losartan.          Subjective:  Patient feel better today.  He did not feel the fever yesterday.  No cough.  Shortness of  breath much better today.  Oxygenation better.  Physical Exam: Vitals:   05/08/23 2234 05/09/23 0012 05/09/23 0841 05/09/23 1113  BP:  (!) 132/57 (!) 128/47 (!) 142/45 (!) 122/45  Pulse: 77 70  83  Resp: 18 16 14 18   Temp: 98.9 F (37.2 C) 99.3 F (37.4 C) 99.4 F (37.4 C)   TempSrc: Oral Oral Oral   SpO2: 94% 95% 91% (!) 87%  Weight:      Height:       General exam: Appears calm and comfortable  Respiratory system: Some crackles in the right lower field.Marland Kitchen Respiratory effort normal. Cardiovascular system: S1 & S2 heard, RRR. No JVD, murmurs, rubs, gallops or clicks. No pedal edema. Gastrointestinal system: Abdomen is nondistended, soft and nontender. No organomegaly or masses felt. Normal bowel sounds heard. Central nervous system: Alert and oriented. No focal neurological deficits. Extremities: Symmetric 5 x 5 power. Skin: No rashes, lesions or ulcers Psychiatry: Judgement and insight appear normal. Mood & affect appropriate.    Data Reviewed:  Reviewed chest x-ray results, lab results.  Family Communication: Wife updated at bedside.  Disposition: Status is: Inpatient Remains inpatient appropriate because: Severity of disease.     Time spent: 35 minutes  Author: Marrion Coy, MD 05/09/2023 1:05 PM  For on call review [email or web address].

## 2023-05-09 NOTE — Evaluation (Signed)
Physical Therapy Evaluation Patient Details Name: Jeremy [name] MRN: [id number] DOB: [birth year] Today's Date: 05/09/2023  History of Present Illness  Patient is a 72 year old male presenting with concerns for weakness. Found to have acute on chronic hypoxemic respiratory failure and diastolic congestive heart failure. History of iron deficiency anemia, COPD, CKD, hypertension, diabetes mellitus, depression, hyperlipidemia, multiple sclerosis.   Clinical Impression  Patient is agreeable to PT evaluation. Supportive spouse/caregiver at the bedside. The patient can stand with spouse assistance at baseline. He does not ambulate. He sleeps in a recliner chair and has DME in place at home. Transport wheelchair is used for mobility around the home as needed and to/from car. One recent fall (lower to the ground) while in the parking lot at the doctor's office.  Today the patient required Max A +2 person to stand. 2 bouts of standing performed from chair with very limited standing tolerance of no more than 15 seconds. Cues for erect standing posture and knee extension with difficulty with carry over. Sp02 was 82% on 3 L02 at rest, increasing to 90% on 6 L02.  The patient currently needs +2 person assistance for standing. The spouse is hopeful for return home. Recommend to continue PT to maximize independence and to decrease caregiver burden.       Assistance Recommended at Discharge Frequent or constant Supervision/Assistance  If plan is discharge home, recommend the following:  Can travel by private vehicle  Two people to help with walking and/or transfers;A lot of help with bathing/dressing/bathroom;Assist for transportation;Help with stairs or ramp for entrance;Assistance with cooking/housework   No    Equipment Recommendations None recommended by PT  Recommendations for Other Services       Functional Status Assessment Patient has had a recent decline in their functional status and  demonstrates the ability to make significant improvements in function in a reasonable and predictable amount of time.     Precautions / Restrictions Precautions Precautions: Fall Restrictions Weight Bearing Restrictions: No      Mobility  Bed Mobility               General bed mobility comments: not assessed as patient sitting up on arrival and post session    Transfers Overall transfer level: Needs assistance   Transfers: Sit to/from Stand Sit to Stand: Max assist, +2 physical assistance           General transfer comment: 2 standing bouts performed. significant lifting assistance required for standing. verbal cues for technique to facilitate independence and for safety    Ambulation/Gait             Pre-gait activities: emphasis on upright standing posture, knee extension with standing, increasing standing tolerance. standing tolerance currently around 10-15 seconds General Gait Details: unable to at this time  Stairs            Wheelchair Mobility     Tilt Bed    Modified Rankin (Stroke Patients Only)       Balance Overall balance assessment: Needs assistance Sitting-balance support: Feet supported Sitting balance-Leahy Scale: Fair     Standing balance support: Bilateral upper extremity supported, Reliant on assistive device for balance Standing balance-Leahy Scale: Zero Standing balance comment: external support required to maintain standing balance                             Pertinent Vitals/Pain Pain Assessment Pain Assessment: Faces Faces Pain Scale: Hurts little more  Pain Location: bottocks Pain Descriptors / Indicators: Discomfort, Grimacing Pain Intervention(s): Limited activity within patient's tolerance, Monitored during session, Repositioned    Home Living Family/patient expects to be discharged to:: Private residence Living Arrangements: Spouse/significant other Available Help at Discharge: Family;Available  24 hours/day Type of Home: House Home Access: Ramped entrance       Home Layout: One level Home Equipment: Wheelchair - Programmer, multimedia (2 wheels) Additional Comments: on 2 L02 at home at all times. was getting PT at home once per week    Prior Function Prior Level of Function : Needs assist;History of Falls (last six months)             Mobility Comments: patient can stand with one person assistance from recliner, BSC, car at baseline. essentially no walking. sleeps in a recliner chair. ADLs Comments: spouse assists with sponge bath and dressing.     Hand Dominance        Extremity/Trunk Assessment   Upper Extremity Assessment Upper Extremity Assessment: Generalized weakness    Lower Extremity Assessment Lower Extremity Assessment: Generalized weakness       Communication      Cognition Arousal/Alertness: Awake/alert Behavior During Therapy: WFL for tasks assessed/performed Overall Cognitive Status: Within Functional Limits for tasks assessed                                 General Comments: patient is able to follow single step commands with increased time        General Comments General comments (skin integrity, edema, etc.): on arrival to room, sp02 82% at rest on 3 L02. 02 increased to 6L to achieve 90% (nurse aware and brought green oxygen tubing). Sp02 remained around 90-92% on 6 L02 with cues for breathing techniques    Exercises     Assessment/Plan    PT Assessment Patient needs continued PT services  PT Problem List Decreased range of motion;Decreased strength;Decreased activity tolerance;Decreased balance;Decreased mobility;Decreased safety awareness;Decreased knowledge of precautions;Cardiopulmonary status limiting activity       PT Treatment Interventions DME instruction;Gait training;Functional mobility training;Therapeutic activities;Therapeutic exercise;Balance training;Neuromuscular  re-education;Cognitive remediation;Patient/family education;Wheelchair mobility training    PT Goals (Current goals can be found in the Care Plan section)  Acute Rehab PT Goals Patient Stated Goal: to return home and have home health PT 2 days per week PT Goal Formulation: With patient/family Time For Goal Achievement: 05/23/23 Potential to Achieve Goals: Fair    Frequency Min 1X/week     Co-evaluation PT/OT/SLP Co-Evaluation/Treatment: Yes Reason for Co-Treatment: Complexity of the patient's impairments (multi-system involvement);To address functional/ADL transfers PT goals addressed during session: Mobility/safety with mobility         AM-PAC PT "6 Clicks" Mobility  Outcome Measure Help needed turning from your back to your side while in a flat bed without using bedrails?: A Lot Help needed moving from lying on your back to sitting on the side of a flat bed without using bedrails?: A Lot Help needed moving to and from a bed to a chair (including a wheelchair)?: Total Help needed standing up from a chair using your arms (e.g., wheelchair or bedside chair)?: Total Help needed to walk in hospital room?: Total Help needed climbing 3-5 steps with a railing? : Total 6 Click Score: 8    End of Session Equipment Utilized During Treatment: Gait belt;Oxygen Activity Tolerance: Patient tolerated treatment well Patient left: in chair;with nursing/sitter in room Nurse Communication:  Mobility status (need fo increased oxygen) PT Visit Diagnosis: Muscle weakness (generalized) (M62.81);Unsteadiness on feet (R26.81)    Time: 2956-2130 PT Time Calculation (min) (ACUTE ONLY): 30 min   Charges:   PT Evaluation $PT Eval Low Complexity: 1 Low PT Treatments $Therapeutic Activity: 8-22 mins PT General Charges $$ ACUTE PT VISIT: 1 Visit         Donna Bernard, PT, MPT  [employer] 05/09/2023, 3:03 PM

## 2023-05-09 NOTE — Evaluation (Signed)
Occupational Therapy Evaluation Patient Details Name: Jeremy [name] MRN: [id number] DOB: [birth year] Today's Date: 05/09/2023   History of Present Illness Patient is a 72 year old male presenting with concerns for weakness. Found to have acute on chronic hypoxemic respiratory failure and diastolic congestive heart failure. History of iron deficiency anemia, COPD, CKD, hypertension, diabetes mellitus, depression, hyperlipidemia, multiple sclerosis   Clinical Impression   Patient presenting with decreased Ind in self care,balance, functional mobility/transfers, endurance, and safety awareness.Patient's wife present in room during assessment as well. He lives with wife who assists him with self care needs. Pt sleeps in lift chair and transfers only to [facility] for toileting needs or wheelchair/transport chair to leave home via ramp for appointments. Pt is on 2Ls at baseline but initially on 3Ls with O2 saturation at 82%. Pt needing to be placed on 6Ls HFNC by nursing in order to increase saturation to 90% or better. Pt stands x 2 attempts with max A of 2 and use of RW.  Patient fatigues very quickly and stands for ~ 20 seconds but unable to come fully upright with knees being bent. Patient will benefit from acute OT to increase overall independence in the areas of ADLs, functional mobility, and safety awareness in order to safely discharge.     Recommendations for follow up therapy are one component of a multi-disciplinary discharge planning process, led by the attending physician.  Recommendations may be updated based on patient status, additional functional criteria and insurance authorization.   Assistance Recommended at Discharge Frequent or constant Supervision/Assistance  Patient can return home with the following Two people to help with walking and/or transfers;A lot of help with bathing/dressing/bathroom;Assistance with cooking/housework;Assist for transportation;Direct supervision/assist for financial  management    Functional Status Assessment  Patient has had a recent decline in their functional status and demonstrates the ability to make significant improvements in function in a reasonable and predictable amount of time.  Equipment Recommendations  None recommended by OT       Precautions / Restrictions Precautions Precautions: Fall Restrictions Weight Bearing Restrictions: No      Mobility Bed Mobility               General bed mobility comments: not assessed as patient sitting up on arrival and post session    Transfers Overall transfer level: Needs assistance   Transfers: Sit to/from Stand Sit to Stand: Max assist, +2 physical assistance           General transfer comment: 2 standing bouts performed. significant lifting assistance required for standing. verbal cues for technique to facilitate independence and for safety      Balance Overall balance assessment: Needs assistance Sitting-balance support: Feet supported Sitting balance-Leahy Scale: Fair     Standing balance support: Bilateral upper extremity supported, Reliant on assistive device for balance Standing balance-Leahy Scale: Zero Standing balance comment: external support required to maintain standing balance                           ADL either performed or assessed with clinical judgement   ADL Overall ADL's : Needs assistance/impaired     Grooming: Wash/dry hands;Wash/dry face;Sitting;Set up                                 General ADL Comments: +2 for standing attempts. Pt would need increased assistance for all self care needs.  Vision Patient Visual Report: No change from baseline              Pertinent Vitals/Pain Pain Assessment Pain Assessment: Faces Faces Pain Scale: Hurts little more Pain Location: buttocks Pain Descriptors / Indicators: Discomfort, Grimacing Pain Intervention(s): Limited activity within patient's tolerance, Monitored  during session, Repositioned     Hand Dominance     Extremity/Trunk Assessment Upper Extremity Assessment Upper Extremity Assessment: Generalized weakness   Lower Extremity Assessment Lower Extremity Assessment: Generalized weakness       Communication Communication Communication: No difficulties   Cognition Arousal/Alertness: Awake/alert Behavior During Therapy: WFL for tasks assessed/performed Overall Cognitive Status: Within Functional Limits for tasks assessed                                       General Comments  on arrival to room, sp02 82% at rest on 3 L02. 02 increased to 6L to achieve 90% (nurse aware and brought green oxygen tubing). Sp02 remained around 90-92% on 6 L02 with cues for breathing techniques            Home Living Family/patient expects to be discharged to:: Private residence Living Arrangements: Spouse/significant other Available Help at Discharge: Family;Available 24 hours/day Type of Home: House Home Access: Ramped entrance     Home Layout: One level     Bathroom Shower/Tub: Sponge bathes at baseline   Bathroom Toilet: Handicapped height     Home Equipment: Wheelchair - Programmer, multimedia (2 wheels)   Additional Comments: on 2 L02 at home at all times. was getting PT at home once per week      Prior Functioning/Environment Prior Level of Function : Needs assist;History of Falls (last six months)             Mobility Comments: patient can stand with one person assistance from recliner, BSC, car at baseline. essentially no walking. sleeps in a recliner chair. ADLs Comments: spouse assists with sponge bath and dressing.        OT Problem List: Decreased strength;Decreased activity tolerance;Decreased safety awareness;Impaired balance (sitting and/or standing);Decreased knowledge of use of DME or AE      OT Treatment/Interventions: Self-care/ADL training;Therapeutic  exercise;Therapeutic activities;DME and/or AE instruction;Balance training;Patient/family education;Energy conservation    OT Goals(Current goals can be found in the care plan section) Acute Rehab OT Goals Patient Stated Goal: to get stronger and return home OT Goal Formulation: With patient Time For Goal Achievement: 05/23/23 Potential to Achieve Goals: Fair ADL Goals Pt Will Perform Grooming: with min assist;standing Pt Will Perform Lower Body Dressing: with min assist;sit to/from stand Pt Will Transfer to Toilet: with min assist;ambulating Pt Will Perform Toileting - Clothing Manipulation and hygiene: with min assist;sit to/from stand  OT Frequency: Min 1X/week    Co-evaluation PT/OT/SLP Co-Evaluation/Treatment: Yes Reason for Co-Treatment: Complexity of the patient's impairments (multi-system involvement);To address functional/ADL transfers PT goals addressed during session: Mobility/safety with mobility OT goals addressed during session: ADL's and self-care;Strengthening/ROM      AM-PAC OT "6 Clicks" Daily Activity     Outcome Measure Help from another person eating meals?: None Help from another person taking care of personal grooming?: A Little Help from another person toileting, which includes using toliet, bedpan, or urinal?: Total Help from another person bathing (including washing, rinsing, drying)?: A Lot Help from another person to put on and taking off regular upper body clothing?: A Lot  Help from another person to put on and taking off regular lower body clothing?: Total 6 Click Score: 13   End of Session Equipment Utilized During Treatment: Rolling walker (2 wheels);Oxygen (6Ls) Nurse Communication: Mobility status;Other (comment) (O2 requirements to 6Ls)  Activity Tolerance: Patient tolerated treatment well Patient left: with call bell/phone within reach;in chair;with chair alarm set  OT Visit Diagnosis: Unsteadiness on feet (R26.81);Repeated falls (R29.6);Muscle  weakness (generalized) (M62.81)                Time: 0102-7253 OT Time Calculation (min): 20 min Charges:  OT General Charges $OT Visit: 1 Visit OT Evaluation $OT Eval Moderate Complexity: 1 [address], [place], OTR/L , CBIS ascom [phone]  05/09/23, 3:58 PM

## 2023-05-10 ENCOUNTER — Telehealth: Payer: Self-pay | Admitting: Family Medicine

## 2023-05-10 ENCOUNTER — Telehealth: Payer: Self-pay

## 2023-05-10 DIAGNOSIS — J9621 Acute and chronic respiratory failure with hypoxia: Secondary | ICD-10-CM | POA: Diagnosis not present

## 2023-05-10 DIAGNOSIS — D649 Anemia, unspecified: Secondary | ICD-10-CM | POA: Diagnosis not present

## 2023-05-10 DIAGNOSIS — I5033 Acute on chronic diastolic (congestive) heart failure: Secondary | ICD-10-CM | POA: Diagnosis not present

## 2023-05-10 LAB — BASIC METABOLIC PANEL
Anion gap: 5 (ref 5–15)
BUN: 31 mg/dL — ABNORMAL HIGH (ref 8–23)
CO2: 26 mmol/L (ref 22–32)
Calcium: 7.8 mg/dL — ABNORMAL LOW (ref 8.9–10.3)
Chloride: 105 mmol/L (ref 98–111)
Creatinine, Ser: 1.36 mg/dL — ABNORMAL HIGH (ref 0.61–1.24)
GFR, Estimated: 56 mL/min — ABNORMAL LOW (ref >60)
Glucose, Bld: 101 mg/dL — ABNORMAL HIGH (ref 70–99)
Potassium: 3.5 mmol/L (ref 3.5–5.1)
Sodium: 136 mmol/L (ref 135–145)

## 2023-05-10 LAB — CBC
HCT: 26.4 % — ABNORMAL LOW (ref 39.0–52.0)
Hemoglobin: 8.4 g/dL — ABNORMAL LOW (ref 13.0–17.0)
MCH: 29.1 pg (ref 26.0–34.0)
MCHC: 31.8 g/dL (ref 30.0–36.0)
MCV: 91.3 fL (ref 80.0–100.0)
Platelets: 221 10*3/uL (ref 150–400)
RBC: 2.89 MIL/uL — ABNORMAL LOW (ref 4.22–5.81)
RDW: 19.8 % — ABNORMAL HIGH (ref 11.5–15.5)
WBC: 7 10*3/uL (ref 4.0–10.5)
nRBC: 0 % (ref 0.0–0.2)

## 2023-05-10 LAB — GLUCOSE, CAPILLARY
Glucose-Capillary: 140 mg/dL — ABNORMAL HIGH (ref 70–99)
Glucose-Capillary: 118 mg/dL — ABNORMAL HIGH (ref 70–99)
Glucose-Capillary: 106 mg/dL — ABNORMAL HIGH (ref 70–99)

## 2023-05-10 MED ORDER — POTASSIUM CHLORIDE CRYS ER 20 MEQ PO TBCR
40.0000 meq | EXTENDED_RELEASE_TABLET | Freq: Once | ORAL | Status: AC
  Administered 2023-05-10: 40 meq via ORAL
  Filled 2023-05-10: qty 2

## 2023-05-10 NOTE — Care Management Important Message (Signed)
Important Message  Patient Details  Name: Jeremy [name] MRN: [id number] Date of Birth: 1951/04/19   Medicare Important Message Given:  N/A - LOS <3 / Initial given by admissions     Olegario Messier A Darlena Koval 05/10/2023, 9:51 AM

## 2023-05-10 NOTE — Telephone Encounter (Signed)
Copied from CRM [id number]. Topic: Quick Communication - Home Health Verbal Orders >> May 10, 2023  1:00 PM Everette C wrote: Caller/Agency: Tommy / [facility] PTA   Tommy with [facility] has called to report a missed visit for the patient and share that a visit will be missed this week 7/15-7/19  The patient is currently in the hospital   Please contact further if needed

## 2023-05-10 NOTE — Progress Notes (Signed)
Occupational Therapy Treatment Patient Details Name: Jeremy [name] MRN: [id number] DOB: [birth year] Today's Date: 05/10/2023   History of present illness Patient is a 72 year old male presenting with concerns for weakness. Found to have acute on chronic hypoxemic respiratory failure and diastolic congestive heart failure. History of iron deficiency anemia, COPD, CKD, hypertension, diabetes mellitus, depression, hyperlipidemia, multiple sclerosis   OT comments  Pt seen for OT treatment on this date. Upon arrival to room pt seated in recliner, agreeable to tx. Pt requires MAX A with RW in place for sit<>stand. Pt on 6L [place], SpO2 was 90% at rest, dropping with exertion to 84%, recovering to 90% with deep breathing techniques. Pt expressed feeling "zapped", but declined other symptoms. Pt educated on HEP and return demonstrated exercises appropriately. Pt making good progress toward goals, will continue to follow POC. Discharge recommendation remains appropriate.     Recommendations for follow up therapy are one component of a multi-disciplinary discharge planning process, led by the attending physician.  Recommendations may be updated based on patient status, additional functional criteria and insurance authorization.    Assistance Recommended at Discharge Frequent or constant Supervision/Assistance  Patient can return home with the following  Two people to help with walking and/or transfers;A lot of help with bathing/dressing/bathroom;Assistance with cooking/housework;Assist for transportation;Direct supervision/assist for financial management   Equipment Recommendations  None recommended by OT    Recommendations for Other Services      Precautions / Restrictions Restrictions Weight Bearing Restrictions: No       Mobility Bed Mobility                    Transfers Overall transfer level: Needs assistance Equipment used: Rolling walker (2 wheels) Transfers: Sit to/from Stand Sit  to Stand: Max assist                 Balance Overall balance assessment: Needs assistance Sitting-balance support: Feet supported Sitting balance-Leahy Scale: Fair     Standing balance support: Bilateral upper extremity supported, Reliant on assistive device for balance Standing balance-Leahy Scale: Poor                             ADL either performed or assessed with clinical judgement   ADL Overall ADL's : Needs assistance/impaired                                     Functional mobility during ADLs: Rolling walker (2 wheels);Maximal assistance General ADL Comments: +2 for standing attempts. Pt would need increased assistance for all self care needs.    Extremity/Trunk Assessment Upper Extremity Assessment Upper Extremity Assessment: Generalized weakness   Lower Extremity Assessment Lower Extremity Assessment: Generalized weakness        Vision       Perception     Praxis      Cognition Arousal/Alertness: Awake/alert Behavior During Therapy: WFL for tasks assessed/performed Overall Cognitive Status: Within Functional Limits for tasks assessed                                 General Comments: patient is able to follow single step commands with increased time        Exercises Exercises: General Upper Extremity General Exercises - Upper Extremity Shoulder Flexion: AROM, Strengthening, Both, 5 reps, Seated  Shoulder Extension: AROM, Strengthening, Both, 5 reps, Seated Elbow Flexion: AROM, Strengthening, Both, 5 reps, Seated Elbow Extension: AROM, Strengthening, Both, 5 reps, Seated    Shoulder Instructions       General Comments      Pertinent Vitals/ Pain       Pain Assessment Pain Assessment: No/denies pain  Home Living                                          Prior Functioning/Environment              Frequency  Min 1X/week        Progress Toward Goals  OT Goals(current  goals can now be found in the care plan section)  Progress towards OT goals: Progressing toward goals  Acute Rehab OT Goals Patient Stated Goal: To get stronger and return home OT Goal Formulation: With patient Time For Goal Achievement: 05/23/23 Potential to Achieve Goals: Fair ADL Goals Pt Will Perform Grooming: with min assist;standing Pt Will Perform Lower Body Dressing: with min assist;sit to/from stand Pt Will Transfer to Toilet: with min assist;ambulating Pt Will Perform Toileting - Clothing Manipulation and hygiene: with min assist;sit to/from stand  Plan Discharge plan remains appropriate    Co-evaluation                 AM-PAC OT "6 Clicks" Daily Activity     Outcome Measure   Help from another person eating meals?: None Help from another person taking care of personal grooming?: A Little Help from another person toileting, which includes using toliet, bedpan, or urinal?: Total Help from another person bathing (including washing, rinsing, drying)?: A Lot Help from another person to put on and taking off regular upper body clothing?: A Lot Help from another person to put on and taking off regular lower body clothing?: Total 6 Click Score: 13    End of Session Equipment Utilized During Treatment: Rolling walker (2 wheels);Oxygen  OT Visit Diagnosis: Unsteadiness on feet (R26.81);Repeated falls (R29.6);Muscle weakness (generalized) (M62.81)   Activity Tolerance Patient tolerated treatment well   Patient Left with call bell/phone within reach;in chair;with chair alarm set   Nurse Communication Mobility status;Other (comment)        Time: 9528-4132 OT Time Calculation (min): 22 min  Charges: OT General Charges $OT Visit: 1 Visit OT Treatments $Therapeutic Exercise: 8-22 mins  Thresa Ross, OTS

## 2023-05-10 NOTE — Progress Notes (Signed)
Physical Therapy Treatment Patient Details Name: Jeremy [name] MRN: [id number] DOB: 1951-07-12 Today's Date: 05/10/2023   History of Present Illness Patient is a 72 year old male presenting with concerns for weakness. Found to have acute on chronic hypoxemic respiratory failure and diastolic congestive heart failure. History of iron deficiency anemia, COPD, CKD, hypertension, diabetes mellitus, depression, hyperlipidemia, multiple sclerosis    PT Comments  Patient was seated in the recliner chair on arrival to room with supportive spouse present. Transfer training continued with 3 standing bouts performed from chair. Patient required one person assistance today for standing which is an improvement from yesterday. Standing tolerance of 20 seconds x 3 bouts with cues for erect posture and knee extension. Extended rest breaks required between bouts of activity with activity tolerance limited by fatigue.  The patient expressed he would ideally want to return home at discharge. The spouse feels she would be able to manage if she had a sit to stand lift to use in the home setting. Anticipate the need for frequent assistance required after this hospital stay with ongoing PT recommended. Will continue to follow.     Assistance Recommended at Discharge Frequent or constant Supervision/Assistance  If plan is discharge home, recommend the following:  Can travel by private vehicle    Two people to help with walking and/or transfers;A lot of help with bathing/dressing/bathroom;Assist for transportation;Help with stairs or ramp for entrance;Assistance with cooking/housework   No  Equipment Recommendations   (sit to stand lift)    Recommendations for Other Services       Precautions / Restrictions Precautions Precautions: Fall Restrictions Weight Bearing Restrictions: No     Mobility  Bed Mobility                    Transfers Overall transfer level: Needs assistance Equipment used:  Rolling walker (2 wheels) Transfers: Sit to/from Stand Sit to Stand: Max assist           General transfer comment: 3 standing bouts performed and one attempt made without achieving full standing position with one person assistance. patient needs cues for foot placement and hand placement to facilitate independence and safety with standing    Ambulation/Gait                   Stairs             Wheelchair Mobility     Tilt Bed    Modified Rankin (Stroke Patients Only)       Balance Overall balance assessment: Needs assistance Sitting-balance support: Feet supported Sitting balance-Leahy Scale: Fair     Standing balance support: Bilateral upper extremity supported, Reliant on assistive device for balance Standing balance-Leahy Scale: Poor Standing balance comment: external support required to maintain standing balance. standing tolerance of 20 seconds x 3 bouts                            Cognition Arousal/Alertness: Awake/alert Behavior During Therapy: WFL for tasks assessed/performed Overall Cognitive Status: Within Functional Limits for tasks assessed                                          Exercises      General Comments General comments (skin integrity, edema, etc.): extended rest breaks required between standing bouts. Sp02 decreased to 83% on 8L initially  after standing but increases to 91% in less than one minute with rest break and cues for breathing techniques. dyspnea with exertion. Sp02 titrated down to 6 L02 at end of session per nurse request. patient discussed his desire to return home. spouse suggested she would likely be able to manage at home if she had a sit to stand lift- have alerted case management and updated equipment recommendations      Pertinent Vitals/Pain Pain Assessment Pain Assessment: No/denies pain    Home Living                          Prior Function            PT Goals  (current goals can now be found in the care plan section) Acute Rehab PT Goals Patient Stated Goal: to return home and have home health PT 2 days per week PT Goal Formulation: With patient/family Time For Goal Achievement: 05/23/23 Potential to Achieve Goals: Fair Progress towards PT goals: Progressing toward goals    Frequency    Min 1X/week      PT Plan Equipment recommendations need to be updated    Co-evaluation              AM-PAC PT "6 Clicks" Mobility   Outcome Measure  Help needed turning from your back to your side while in a flat bed without using bedrails?: A Lot Help needed moving from lying on your back to sitting on the side of a flat bed without using bedrails?: A Lot Help needed moving to and from a bed to a chair (including a wheelchair)?: Total Help needed standing up from a chair using your arms (e.g., wheelchair or bedside chair)?: Total Help needed to walk in hospital room?: Total Help needed climbing 3-5 steps with a railing? : Total 6 Click Score: 8    End of Session Equipment Utilized During Treatment: Oxygen Activity Tolerance: Patient limited by fatigue Patient left: in chair;with call bell/phone within reach;with family/visitor present Nurse Communication: Mobility status PT Visit Diagnosis: Muscle weakness (generalized) (M62.81);Unsteadiness on feet (R26.81)     Time: 1332-1410 PT Time Calculation (min) (ACUTE ONLY): 38 min  Charges:    $Therapeutic Activity: 38-52 mins PT General Charges $$ ACUTE PT VISIT: 1 Visit                     Donna Bernard, PT, MPT    Ina Homes 05/10/2023, 3:19 PM

## 2023-05-10 NOTE — Telephone Encounter (Signed)
Tommy with [facility] called and stated pt missed appts this week due to being in the hospital.

## 2023-05-10 NOTE — Progress Notes (Signed)
Progress Note   Patient: Jeremy [name] HKV:[id number] DOB: 1951-02-24 DOA: 05/06/2023     4 DOS: the patient was seen and examined on 05/10/2023   Brief hospital course: [name] is a 72 year old male with history of iron deficiency anemia presumed secondary to chronic blood loss in setting of possible venous malformation and Chronic gastritis, CKD stage II/IIIa, hypertension, insulin-dependent diabetes mellitus, depression, hyperlipidemia, who presents to the emergency department for chief concerns of weakness. Hemoglobin was 6.7, received blood transfusion.  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  Patient had increased short of breath in the morning of 7/14, gave dose of IV Lasix.   Principal Problem:   Symptomatic anemia Active Problems:   Chronic blood loss anemia   Acute on chronic respiratory failure with hypoxia (HCC)   Arteriovenous malformation (AVM)   Type 2 diabetes mellitus with diabetic polyneuropathy, with long-term current use of insulin (HCC)   Stage 3a chronic kidney disease (HCC)   COPD (chronic obstructive pulmonary disease) (HCC)   Multiple sclerosis (HCC)   Essential hypertension   Hyperlipidemia   Depression   Gastroesophageal reflux disease without esophagitis   Primary pulmonary hypertension (HCC)   Anemia in chronic kidney disease (CKD)   Elevated troponin   CKD (chronic kidney disease) stage 2, GFR 60-89 ml/min   Cirrhosis of liver without ascites (HCC)   Acute on chronic diastolic CHF (congestive heart failure) (HCC)   Moderate pulmonary hypertension (HCC)   Assessment and Plan: Acute on chronic hypoxemic respiratory failure. Acute on chronic diastolic congestive heart failure. Patient is chronically on 2 L oxygen at home for COPD.  He had a worsening short of breath this morning, oxygen saturations dropped down to 84%, oxygen was increased to 4 L.  BNP elevated at 704.  Patient also had  evidence of volume overload. Patient is treated with 1 dose IV Lasix, followed by oral Lasix.  Echocardiogram showed moderate pulm hypertension with diastolic dysfunction.  Ejection fraction normal. Symptomatically improved, slightly worsening renal function, discontinued IV Lasix.  Wean oxygen back to 2 L.   Low-grade fever. Patient had a temperature 100.5 on 7/14, no recurrence. Urine study today did not show any abnormality.  COVID-negative, procalcitonin level less than 0.1, repeat chest x-ray showed right pleural effusion, reported some right lower lobe consolidation.  It is possible patient had aspiration.   Repeated procalcitonin level still less than 0.1, urine study to evidence of UTI.  Patient does not have fever anymore today.    Symptomatic anemia Gastric Arteriovenous malformation (AVM) Liver cirrhosis. 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 Right upper ultrasound showed liver cirrhosis. Anemia most likely secondary to bone marrow suppression from liver disease, iron level is low, received IV iron.   Hemoglobin is more stable.   Type 2 diabetes mellitus with diabetic polyneuropathy, with long-term current use of insulin (HCC) Glucose still stable, continue sliding scale insulin for now.   Stage 3a chronic kidney disease (HCC) At baseline   COPD (chronic obstructive pulmonary disease) (HCC) Stable.   Elevated troponin Suspect secondary to acute on chronic blood loss anemia   Depression Sertraline 25 mg daily resumed for 7/13   Hyperlipidemia Simvastatin 40 mg every evening resumed   Essential hypertension Continue losartan.          Subjective: Patient doing better today, less shortness of breath.  No black stool or rectal bleeding.  Physical  Exam: Vitals:   05/09/23 2105 05/10/23 0028 05/10/23 0617 05/10/23 0847  BP: (!) 124/47 (!) 114/46 (!) 120/49 (!) 120/51  Pulse: 72 68 74 69  Resp:  18 18 18 20   Temp: 98.6 F (37 C) 98.7 F (37.1 C) 98.8 F (37.1 C) 98.8 F (37.1 C)  TempSrc: Oral Oral Oral   SpO2: 94% 99% 96% 95%  Weight:      Height:       General exam: Appears calm and comfortable  Respiratory system: Decreased breath sounds without crackles or wheezes. Respiratory effort normal. Cardiovascular system: S1 & S2 heard, RRR. No JVD, murmurs, rubs, gallops or clicks. No pedal edema. Gastrointestinal system: Abdomen is nondistended, soft and nontender. No organomegaly or masses felt. Normal bowel sounds heard. Central nervous system: Alert and oriented x2. No focal neurological deficits. Extremities: Symmetric 5 x 5 power. Skin: No rashes, lesions or ulcers Psychiatry: Judgement and insight appear normal. Mood & affect appropriate.    Data Reviewed:  Lab results reviewed.  Family Communication: Wife updated at bedside.  Disposition: Status is: Inpatient Remains inpatient appropriate because: Unsafe discharge, pending nursing home placement.     Time spent: 35 minutes  Author: Marrion Coy, MD 05/10/2023 10:36 AM  For on call review [email or web address].

## 2023-05-11 ENCOUNTER — Inpatient Hospital Stay: Payer: Medicare Other

## 2023-05-11 DIAGNOSIS — I5A Non-ischemic myocardial injury (non-traumatic): Secondary | ICD-10-CM

## 2023-05-11 DIAGNOSIS — R531 Weakness: Secondary | ICD-10-CM

## 2023-05-11 DIAGNOSIS — J439 Emphysema, unspecified: Secondary | ICD-10-CM

## 2023-05-11 DIAGNOSIS — K7460 Unspecified cirrhosis of liver: Secondary | ICD-10-CM

## 2023-05-11 DIAGNOSIS — G35 Multiple sclerosis: Secondary | ICD-10-CM

## 2023-05-11 DIAGNOSIS — E785 Hyperlipidemia, unspecified: Secondary | ICD-10-CM

## 2023-05-11 DIAGNOSIS — K219 Gastro-esophageal reflux disease without esophagitis: Secondary | ICD-10-CM

## 2023-05-11 DIAGNOSIS — I5033 Acute on chronic diastolic (congestive) heart failure: Secondary | ICD-10-CM | POA: Diagnosis not present

## 2023-05-11 DIAGNOSIS — J9621 Acute and chronic respiratory failure with hypoxia: Secondary | ICD-10-CM | POA: Diagnosis not present

## 2023-05-11 DIAGNOSIS — I10 Essential (primary) hypertension: Secondary | ICD-10-CM

## 2023-05-11 DIAGNOSIS — J189 Pneumonia, unspecified organism: Secondary | ICD-10-CM | POA: Diagnosis not present

## 2023-05-11 LAB — BASIC METABOLIC PANEL
Anion gap: 4 — ABNORMAL LOW (ref 5–15)
BUN: 34 mg/dL — ABNORMAL HIGH (ref 8–23)
CO2: 26 mmol/L (ref 22–32)
Calcium: 7.6 mg/dL — ABNORMAL LOW (ref 8.9–10.3)
Chloride: 108 mmol/L (ref 98–111)
Creatinine, Ser: 1.22 mg/dL (ref 0.61–1.24)
GFR, Estimated: >60 mL/min (ref >60)
Glucose, Bld: 93 mg/dL (ref 70–99)
Potassium: 3.9 mmol/L (ref 3.5–5.1)
Sodium: 138 mmol/L (ref 135–145)

## 2023-05-11 LAB — CBC
HCT: 26.9 % — ABNORMAL LOW (ref 39.0–52.0)
Hemoglobin: 8.2 g/dL — ABNORMAL LOW (ref 13.0–17.0)
MCH: 28.8 pg (ref 26.0–34.0)
MCHC: 30.5 g/dL (ref 30.0–36.0)
MCV: 94.4 fL (ref 80.0–100.0)
Platelets: 212 10*3/uL (ref 150–400)
RBC: 2.85 MIL/uL — ABNORMAL LOW (ref 4.22–5.81)
RDW: 19.3 % — ABNORMAL HIGH (ref 11.5–15.5)
WBC: 5.7 10*3/uL (ref 4.0–10.5)
nRBC: 0 % (ref 0.0–0.2)

## 2023-05-11 LAB — GLUCOSE, CAPILLARY
Glucose-Capillary: 84 mg/dL (ref 70–99)
Glucose-Capillary: 191 mg/dL — ABNORMAL HIGH (ref 70–99)
Glucose-Capillary: 84 mg/dL (ref 70–99)
Glucose-Capillary: 172 mg/dL — ABNORMAL HIGH (ref 70–99)

## 2023-05-11 MED ORDER — FUROSEMIDE 40 MG PO TABS
40.0000 mg | ORAL_TABLET | Freq: Every day | ORAL | Status: DC
  Administered 2023-05-11: 40 mg via ORAL
  Filled 2023-05-11: qty 1

## 2023-05-11 MED ORDER — SODIUM CHLORIDE 0.9 % IV SOLN
2.0000 g | INTRAVENOUS | Status: DC
  Administered 2023-05-11 – 2023-05-15 (×5): 2 g via INTRAVENOUS
  Filled 2023-05-11 (×5): qty 20

## 2023-05-11 MED ORDER — FUROSEMIDE 40 MG PO TABS
40.0000 mg | ORAL_TABLET | Freq: Two times a day (BID) | ORAL | Status: DC
  Administered 2023-05-11: 40 mg via ORAL
  Filled 2023-05-11: qty 1

## 2023-05-11 MED ORDER — ACETAMINOPHEN 500 MG PO TABS
1000.0000 mg | ORAL_TABLET | Freq: Every day | ORAL | Status: DC
  Administered 2023-05-11 – 2023-05-19 (×8): 1000 mg via ORAL
  Filled 2023-05-11 (×9): qty 2

## 2023-05-11 MED ORDER — POTASSIUM CHLORIDE CRYS ER 20 MEQ PO TBCR
20.0000 meq | EXTENDED_RELEASE_TABLET | Freq: Two times a day (BID) | ORAL | Status: DC
  Administered 2023-05-11 – 2023-05-12 (×2): 20 meq via ORAL
  Filled 2023-05-11 (×2): qty 1

## 2023-05-11 MED ORDER — AZITHROMYCIN 500 MG PO TABS
500.0000 mg | ORAL_TABLET | Freq: Every day | ORAL | Status: AC
  Administered 2023-05-11: 500 mg via ORAL
  Filled 2023-05-11: qty 1

## 2023-05-11 MED ORDER — INTERFERON BETA-1B 0.3 MG ~~LOC~~ KIT
0.2500 mg | PACK | SUBCUTANEOUS | Status: DC
  Administered 2023-05-11 – 2023-05-19 (×5): 0.25 mg via SUBCUTANEOUS
  Filled 2023-05-11 (×10): qty 0.3

## 2023-05-11 MED ORDER — AZITHROMYCIN 500 MG PO TABS
250.0000 mg | ORAL_TABLET | Freq: Every day | ORAL | Status: AC
  Administered 2023-05-12 – 2023-05-15 (×4): 250 mg via ORAL
  Filled 2023-05-11 (×4): qty 1

## 2023-05-11 MED ORDER — POTASSIUM CHLORIDE CRYS ER 20 MEQ PO TBCR
20.0000 meq | EXTENDED_RELEASE_TABLET | Freq: Every day | ORAL | Status: DC
  Administered 2023-05-11: 20 meq via ORAL
  Filled 2023-05-11: qty 1

## 2023-05-11 NOTE — Progress Notes (Signed)
Progress Note   Patient: Jeremy [name] FAO:[id number] DOB: 1951/10/13 DOA: 05/06/2023     5 DOS: the patient was seen and examined on 05/11/2023   Brief hospital course: [name] is a 72 year old male with history of iron deficiency anemia presumed secondary to chronic blood loss in setting of possible venous malformation and Chronic gastritis, CKD stage II/IIIa, hypertension, insulin-dependent diabetes mellitus, depression, hyperlipidemia, who presents to the emergency department for chief concerns of weakness. Hemoglobin was 6.7, received blood transfusion.  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  Patient had increased short of breath in the morning of 7/14, gave dose of IV Lasix.  7/17.  Patient on 7 L nasal cannula.  Repeat chest x-ray showing likely pneumonia right lower lobe.  Antibiotics started.  Patient also placed on Lasix for lower extremity swelling and shortness of breath.  Assessment and Plan: * Acute on chronic respiratory failure with hypoxia (HCC) Patient on 7 L nasal cannula this morning.  Started Lasix this morning.  With repeat chest x-ray showing right lower lobe pneumonia I started antibiotics.  Continue to taper oxygen.  Right lower lobe pneumonia Rocephin and Zithromax started.  Acute on chronic diastolic CHF (congestive heart failure) (HCC) Patient started on Lasix 40 mg orally twice daily with potassium.  Continue to monitor.  Chronic blood loss anemia Continue to monitor hemoglobin.  Last hemoglobin 8.2.  Stage 3a chronic kidney disease (HCC) Last creatinine 1.22 with a GFR of 60  COPD (chronic obstructive pulmonary disease) (HCC) Continue nebulizers and inhalers  Multiple sclerosis (HCC) On Betaseron injection  Type 2 diabetes mellitus with diabetic polyneuropathy, with long-term current use of insulin (HCC) On sliding scale insulin and Lyrica.  Last hemoglobin A1c 1C actually low at 4.4  back in January.  Myocardial injury Secondary to blood loss anemia  Cirrhosis of liver without ascites (HCC) Continue to monitor hemoglobin  Anemia in chronic kidney disease (CKD) Last hemoglobin 8.2, last ferritin 160.  Gastroesophageal reflux disease without esophagitis On Protonix  Depression On Zoloft  Hyperlipidemia On Zocor  Essential hypertension On Lasix        Subjective: Patient feels okay.  On 7 L of oxygen.  Some shortness of breath.  Chronically wears 2 L of oxygen.  Physical Exam: Vitals:   05/11/23 0531 05/11/23 0804 05/11/23 0810 05/11/23 1700  BP: (!) 121/49  (!) 127/46 (!) 140/47  Pulse: 64 65 68 77  Resp: 18 16 16 14   Temp: 98 F (36.7 C)  98.7 F (37.1 C) 99.2 F (37.3 C)  TempSrc:    Oral  SpO2: 98% 96% 94% (!) 89%  Weight:      Height:       Physical Exam HENT:     Head: Normocephalic.     Mouth/Throat:     Pharynx: No oropharyngeal exudate.  Eyes:     General: Lids are normal.     Conjunctiva/sclera: Conjunctivae normal.  Cardiovascular:     Rate and Rhythm: Normal rate and regular rhythm.     Heart sounds: Normal heart sounds, S1 normal and S2 normal.  Pulmonary:     Breath sounds: Examination of the right-lower field reveals decreased breath sounds and rhonchi. Examination of the left-lower field reveals decreased breath sounds and rhonchi. Decreased breath sounds and rhonchi present.  Abdominal:     Palpations: Abdomen is soft.     Tenderness: There is no abdominal tenderness.  Musculoskeletal:     Right  lower leg: Swelling present.     Left lower leg: Swelling present.  Skin:    General: Skin is warm.     Comments: Chronic lower extremity skin discoloration  Neurological:     Mental Status: He is alert and oriented to person, place, and time.     Data Reviewed: Chest x-ray showing increased opacification right lower lung field, likely pneumonia and/or pleural effusion and/or atelectasis. White blood cell count 5.7,  hemoglobin 8.2, platelet count 212, creatinine 1.22, electrolytes normal range  Family Communication: Spoke with wife at the bedside  Disposition: Status is: Inpatient Remains inpatient appropriate because: Patient on 7 L nasal cannula this morning.  Planned Discharge Destination: Rehab    Time spent: 28 minutes  Author: Alford Highland, MD 05/11/2023 5:35 PM  For on call review [email or web address].

## 2023-05-11 NOTE — Assessment & Plan Note (Signed)
 On Protonix 

## 2023-05-11 NOTE — Assessment & Plan Note (Addendum)
Patient had worsening oxygenation on 7/14 with pulse ox of 84% on 3 L.  With repeat chest x-ray showing right lower lobe pneumonia I started antibiotics on 7/17 and completed course.  This morning patient on 4 L nasal cannula with borderline saturations.  Desaturates down to 83% with limited movement on 6 L.  Continue IV diuresis with Lasix twice daily.

## 2023-05-11 NOTE — Progress Notes (Signed)
Physical Therapy Treatment Patient Details Name: Jeremy [name] MRN: [id number] DOB: Jan 06, 1951 Today's Date: 05/11/2023   History of Present Illness Patient is a 72 year old male presenting with concerns for weakness. Found to have acute on chronic hypoxemic respiratory failure and diastolic congestive heart failure. History of iron deficiency anemia, COPD, CKD, hypertension, diabetes mellitus, depression, hyperlipidemia, multiple sclerosis    PT Comments  Patient in bed on arrival to room. He was agreeable to PT and reports feeling better today than yesterday. The patient required +2 person assistance for stand step transfer from bed to chair with maximal cues for sequencing and safety. The patient and spouse feel he is stronger today as he was able to take several steps with transfer to the chair. Activity tolerance continues to be limited by fatigue. Sp02 90-93% on 5 L02. Encouraged breathing techniques and using incentive spirometer. Recommend to continue PT to maximize independence and decrease caregiver burden. The patient and spouse are hopeful for continued improvement in mobility and return home if possible.     Assistance Recommended at Discharge Frequent or constant Supervision/Assistance  If plan is discharge home, recommend the following:  Can travel by private vehicle    Two people to help with walking and/or transfers;A lot of help with bathing/dressing/bathroom;Assist for transportation;Help with stairs or ramp for entrance;Assistance with cooking/housework   No  Equipment Recommendations  None recommended by PT    Recommendations for Other Services       Precautions / Restrictions Precautions Precautions: Fall Restrictions Weight Bearing Restrictions: No     Mobility  Bed Mobility Overal bed mobility: Needs Assistance Bed Mobility: Supine to Sit     Supine to sit: Max assist, HOB elevated     General bed mobility comments: assistance for BLE and trunk  support. verbal cues for technique with increased time and effort required    Transfers Overall transfer level: Needs assistance Equipment used: Rolling walker (2 wheels) Transfers: Bed to chair/wheelchair/BSC     Step pivot transfers: Mod assist, +2 physical assistance, From elevated surface       General transfer comment: verbal cues for foot placement and taking steps from bed to chair. difficulty with sequencing. patient fatigued with activity    Ambulation/Gait               General Gait Details: unable to at this time   Stairs             Wheelchair Mobility     Tilt Bed    Modified Rankin (Stroke Patients Only)       Balance Overall balance assessment: Needs assistance Sitting-balance support: Feet supported Sitting balance-Leahy Scale: Fair     Standing balance support: Bilateral upper extremity supported, Reliant on assistive device for balance Standing balance-Leahy Scale: Poor Standing balance comment: external support needed                            Cognition Arousal/Alertness: Awake/alert Behavior During Therapy: WFL for tasks assessed/performed Overall Cognitive Status: Within Functional Limits for tasks assessed                                          Exercises      General Comments General comments (skin integrity, edema, etc.): Sp02 90-3% on 5 L 02. encouraged breathing techniques and using incentive spirometer. educated patient  on ways to off load buttocks while sitting in the chair for skin integrity purposes , including modified wheelchair push ups, weight shifting.      Pertinent Vitals/Pain Pain Assessment Pain Assessment: Faces Faces Pain Scale: Hurts little more Pain Location: bottocks, back Pain Descriptors / Indicators: Discomfort Pain Intervention(s): Limited activity within patient's tolerance, Monitored during session, Repositioned    Home Living                           Prior Function            PT Goals (current goals can now be found in the care plan section) Acute Rehab PT Goals Patient Stated Goal: to go home when ready PT Goal Formulation: With patient/family Time For Goal Achievement: 05/23/23 Potential to Achieve Goals: Fair Progress towards PT goals: Progressing toward goals    Frequency    Min 1X/week      PT Plan Equipment recommendations need to be updated    Co-evaluation              AM-PAC PT "6 Clicks" Mobility   Outcome Measure  Help needed turning from your back to your side while in a flat bed without using bedrails?: A Lot Help needed moving from lying on your back to sitting on the side of a flat bed without using bedrails?: A Lot Help needed moving to and from a bed to a chair (including a wheelchair)?: Total Help needed standing up from a chair using your arms (e.g., wheelchair or bedside chair)?: Total Help needed to walk in hospital room?: Total Help needed climbing 3-5 steps with a railing? : Total 6 Click Score: 8    End of Session Equipment Utilized During Treatment: Oxygen Activity Tolerance: Patient limited by fatigue;Patient tolerated treatment well Patient left: in chair;with call bell/phone within reach Nurse Communication: Mobility status PT Visit Diagnosis: Muscle weakness (generalized) (M62.81);Unsteadiness on feet (R26.81)     Time: 2956-2130 PT Time Calculation (min) (ACUTE ONLY): 27 min  Charges:    $Therapeutic Activity: 23-37 mins PT General Charges $$ ACUTE PT VISIT: 1 Visit                     Donna Bernard, PT, MPT    Ina Homes 05/11/2023, 2:56 PM

## 2023-05-11 NOTE — Assessment & Plan Note (Addendum)
With quite a bit of weight gain during the hospital stay and will switch to Lasix to 40 mg IV twice daily and increase potassium.  Small pleural effusion on last chest x-ray.

## 2023-05-11 NOTE — Assessment & Plan Note (Addendum)
Rocephin and Zithromax started on 7/17.

## 2023-05-11 NOTE — Assessment & Plan Note (Signed)
Secondary to blood loss anemia.

## 2023-05-11 NOTE — Assessment & Plan Note (Signed)
Continue to monitor hemoglobin

## 2023-05-11 NOTE — Assessment & Plan Note (Signed)
On Betaseron injection

## 2023-05-12 DIAGNOSIS — J189 Pneumonia, unspecified organism: Secondary | ICD-10-CM | POA: Diagnosis not present

## 2023-05-12 DIAGNOSIS — R531 Weakness: Secondary | ICD-10-CM | POA: Diagnosis not present

## 2023-05-12 DIAGNOSIS — I5033 Acute on chronic diastolic (congestive) heart failure: Secondary | ICD-10-CM | POA: Diagnosis not present

## 2023-05-12 DIAGNOSIS — J9621 Acute and chronic respiratory failure with hypoxia: Secondary | ICD-10-CM | POA: Diagnosis not present

## 2023-05-12 LAB — BLOOD GAS, ARTERIAL
Acid-Base Excess: 0.1 mmol/L (ref 0.0–2.0)
Bicarbonate: 24.7 mmol/L (ref 20.0–28.0)
O2 Content: 5 L/min
O2 Saturation: 92.2 %
Patient temperature: 37
pCO2 arterial: 39 mmHg (ref 32–48)
pH, Arterial: 7.41 (ref 7.35–7.45)
pO2, Arterial: 59 mmHg — ABNORMAL LOW (ref 83–108)

## 2023-05-12 LAB — BASIC METABOLIC PANEL
Anion gap: 5 (ref 5–15)
BUN: 32 mg/dL — ABNORMAL HIGH (ref 8–23)
CO2: 25 mmol/L (ref 22–32)
Calcium: 7.5 mg/dL — ABNORMAL LOW (ref 8.9–10.3)
Chloride: 104 mmol/L (ref 98–111)
Creatinine, Ser: 1.32 mg/dL — ABNORMAL HIGH (ref 0.61–1.24)
GFR, Estimated: 58 mL/min — ABNORMAL LOW (ref >60)
Glucose, Bld: 82 mg/dL (ref 70–99)
Potassium: 4.6 mmol/L (ref 3.5–5.1)
Sodium: 134 mmol/L — ABNORMAL LOW (ref 135–145)

## 2023-05-12 LAB — GLUCOSE, CAPILLARY
Glucose-Capillary: 88 mg/dL (ref 70–99)
Glucose-Capillary: 206 mg/dL — ABNORMAL HIGH (ref 70–99)
Glucose-Capillary: 130 mg/dL — ABNORMAL HIGH (ref 70–99)
Glucose-Capillary: 200 mg/dL — ABNORMAL HIGH (ref 70–99)

## 2023-05-12 LAB — HEMOGLOBIN: Hemoglobin: 8.1 g/dL — ABNORMAL LOW (ref 13.0–17.0)

## 2023-05-12 MED ORDER — FUROSEMIDE 20 MG PO TABS
20.0000 mg | ORAL_TABLET | Freq: Two times a day (BID) | ORAL | Status: DC
  Administered 2023-05-12 – 2023-05-13 (×3): 20 mg via ORAL
  Filled 2023-05-12 (×4): qty 1

## 2023-05-12 MED ORDER — POTASSIUM CHLORIDE CRYS ER 10 MEQ PO TBCR
10.0000 meq | EXTENDED_RELEASE_TABLET | Freq: Two times a day (BID) | ORAL | Status: DC
  Administered 2023-05-12 – 2023-05-13 (×2): 10 meq via ORAL
  Filled 2023-05-12 (×2): qty 1

## 2023-05-12 NOTE — Plan of Care (Signed)
  Problem: Coping: Goal: Ability to adjust to condition or change in health will improve Outcome: Progressing   Problem: Health Behavior/Discharge Planning: Goal: Ability to manage health-related needs will improve Reactivated   Problem: Activity: Goal: Risk for activity intolerance will decrease Reactivated   Problem: Elimination: Goal: Will not experience complications related to bowel motility Reactivated

## 2023-05-12 NOTE — Progress Notes (Signed)
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 Hemoglobin was 6.7, received blood transfusion.  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  Patient had increased short of breath in the morning of 7/14, gave dose of IV Lasix.  7/17.  Patient on 7 L nasal cannula.  Repeat chest x-ray showing likely pneumonia right lower lobe.  Antibiotics started.  Patient also placed on Lasix for lower extremity swelling and shortness of breath. 7/18.  Patient down to 5 L nasal cannula.  Assessment and Plan: * Acute on chronic respiratory failure with hypoxia [facility]) Patient had worsening oxygenation on 7/14 with pulse ox of 84% on 3 L.  Patient on 5 L nasal cannula this morning (baseline oxygen 2 L).  With repeat chest x-ray showing right lower lobe pneumonia I started antibiotics on 7/17.Marland Kitchen  Continue to taper oxygen.  Decrease Lasix to lower dose.  Right lower lobe pneumonia Rocephin and Zithromax started on 7/17.  Acute on chronic diastolic CHF (congestive heart failure) (HCC) Decrease Lasix 20 mg orally twice daily with potassium.  Continue to monitor.  Generalized weakness Physical therapy recommending rehab but patient may be interested in going home.  He needs to improve with PT and OT for this decision.  Chronic blood loss anemia Continue to monitor hemoglobin.  Last hemoglobin 8.1.  Stage 3a chronic kidney disease (HCC) Last creatinine 1.32 with a GFR of 58.  Will cut back  Lasix to 20 mg orally twice a day.  COPD (chronic obstructive pulmonary disease) (HCC) Continue nebulizers and inhalers  Multiple sclerosis (HCC) On Betaseron injection  Type 2 diabetes mellitus with diabetic polyneuropathy, with long-term current use of insulin (HCC) On sliding scale insulin and Lyrica.  Last hemoglobin A1c 1C actually low at 4.4 back in January.  Myocardial injury Secondary to blood loss anemia  Cirrhosis of liver without ascites (HCC) Continue to monitor hemoglobin  Anemia in chronic kidney disease (CKD) Last hemoglobin 8.1, last ferritin 160.  Gastroesophageal reflux disease without esophagitis On Protonix  Depression On Zoloft  Hyperlipidemia On Zocor  Essential hypertension On Lasix        Subjective: Patient down to 5 L nasal cannula.  Breathing little bit better.  Started on antibiotics yesterday for pneumonia and also Lasix to get rid of fluid.  Initially admitted with symptomatic anemia.  Physical Exam: Vitals:   05/12/23 0729 05/12/23 0742 05/12/23 0745 05/12/23 1617  BP:  (!) 126/47 (!) 121/50 (!) 139/55  Pulse:  65 65 73  Resp:  20  16  Temp:  98.5 F (36.9 C)  98.3 F (36.8 C)  TempSrc:      SpO2: 92% 92%  94%  Weight:      Height:       Physical Exam HENT:     Head: Normocephalic.     Mouth/Throat:     Pharynx: No oropharyngeal exudate.  Eyes:     General: Lids are normal.     Conjunctiva/sclera: Conjunctivae normal.  Cardiovascular:     Rate and Rhythm: Normal rate and regular  rhythm.     Heart sounds: Normal heart sounds, S1 normal and S2 normal.  Pulmonary:     Breath sounds: Examination of the right-lower field reveals decreased breath sounds. Examination of the left-lower field reveals decreased breath sounds. Decreased breath sounds present. No rhonchi.  Abdominal:     Palpations: Abdomen is soft.     Tenderness: There is no abdominal tenderness.  Musculoskeletal:     Right lower leg: Swelling present.      Left lower leg: Swelling present.  Skin:    General: Skin is warm.     Comments: Chronic lower extremity skin discoloration  Neurological:     Mental Status: He is alert and oriented to person, place, and time.     Data Reviewed: Hemoglobin 8.1, creatinine 1.32, sodium 134  Family Communication: Spoke with wife at bedside  Disposition: Status is: Inpatient Remains inpatient appropriate because: Tapering down oxygen down to 5 L today.  Baseline oxygen 2 L.  Continue IV antibiotics for pneumonia  Planned Discharge Destination: Skilled nursing facility patient now interested in potentially going home.    Time spent: 28 minutes  Author: Alford Highland, MD 05/12/2023 5:02 PM  For on call review [email or web address].

## 2023-05-12 NOTE — Assessment & Plan Note (Signed)
Physical therapy recommending rehab but patient may be interested in going home.  He needs to improve with PT and OT for this decision.

## 2023-05-12 NOTE — Care Management Important Message (Signed)
Important Message  Patient Details  Name: Jabreel Chimento MRN: [id number] Date of Birth: [birth year]   Medicare Important Message Given:  Yes     Olegario Messier A Madesyn Ast 05/12/2023, 1:48 PM

## 2023-05-12 NOTE — Progress Notes (Signed)
Physical Therapy Treatment Patient Details Name: Jeremy [name] MRN: [id number] DOB: 1951/07/04 Today's Date: 05/12/2023   History of Present Illness Patient is a 72 year old male presenting with concerns for weakness. Found to have acute on chronic hypoxemic respiratory failure and diastolic congestive heart failure. History of iron deficiency anemia, COPD, CKD, hypertension, diabetes mellitus, depression, hyperlipidemia, multiple sclerosis    PT Comments  Pt was sitting in recliner and has been in recliner since right before lunch. He endorses wanting to return to bed. Supportive caregiver present throughout session. Discussed at length, DC disposition and home needs to be setup for success once DC home. Pt unwilling to go to [facility]. They have all equipment needs met except sit to stand/sabina list. Per caregiver," I talked to the company already to get equipment delivered. Pt was on 5 L o2. He did desaturate to upper 70s and required increased O2 supply to recover to > 90%. Once recovered, able to wean back to 5 L when resting in bed. Pt requires extensive assistance to stand from recliner and take a few steps to EOB. Nurse, adult use +2 assistance for any/all OOB activity. Overall pt tolerated session well. Recommend continued skilled PT to maximize independence and safety while decreasing caregiver burden.      Assistance Recommended at Discharge Frequent or constant Supervision/Assistance  If plan is discharge home, recommend the following:  Can travel by private vehicle    A lot of help with walking and/or transfers;A lot of help with bathing/dressing/bathroom;Assistance with cooking/housework;Direct supervision/assist for medications management;Direct supervision/assist for financial management;Assist for transportation;Help with stairs or ramp for entrance      Equipment Recommendations  None recommended by PT       Precautions / Restrictions Precautions Precautions:  Fall Restrictions Weight Bearing Restrictions: No     Mobility  Bed Mobility Overal bed mobility: Needs Assistance Bed Mobility: Supine to Sit, Sit to Supine  Supine to sit: Max assist, HOB elevated Sit to supine: Max assist, HOB elevated   Transfers Overall transfer level: Needs assistance Equipment used: Rolling walker (2 wheels) Transfers: Bed to chair/wheelchair/BSC  Step pivot transfers: Mod assist, Max assist    General transfer comment: Pt was able to stand and take several steps form recliner to EOB. R knee buckling noted. Pt is slow moving and limited by weakness.    Ambulation/Gait Ambulation/Gait assistance: Mod assist, Max assist Gait Distance (Feet): 3 Feet Assistive device: Rolling walker (2 wheels) Gait Pattern/deviations: Step-to pattern, Trunk flexed Gait velocity: decreased  General Gait Details: Pt is able to take a few very effortful steps from recliner to EOB. Knee buckling noted. Spouse reports better today than yesterday    Balance Overall balance assessment: Needs assistance Sitting-balance support: Feet supported Sitting balance-Leahy Scale: Fair  Standing balance support: Bilateral upper extremity supported, Reliant on assistive device for balance Standing balance-Leahy Scale: Poor    Cognition Arousal/Alertness: Awake/alert Behavior During Therapy: WFL for tasks assessed/performed Overall Cognitive Status: Within Functional Limits for tasks assessed    General Comments: pt was sitting in recliner pre session. He is A and O and agreeable to session.           General Comments General comments (skin integrity, edema, etc.): Lengthy discussion about DC needs and management of expectation. Both pt and spouse feel safe to DC home with proper equipment and [facility] services      Pertinent Vitals/Pain Pain Assessment Pain Assessment: No/denies pain Pain Score: 0-No pain Pain Intervention(s): Limited activity  within patient's tolerance, Monitored  during session, Premedicated before session, Repositioned     PT Goals (current goals can now be found in the care plan section) Acute Rehab PT Goals Patient Stated Goal: " Get better and go home." Progress towards PT goals: Progressing toward goals    Frequency    Min 1X/week      PT Plan Equipment recommendations need to be updated    Co-evaluation     PT goals addressed during session: Mobility/safety with mobility        AM-PAC PT "6 Clicks" Mobility   Outcome Measure  Help needed turning from your back to your side while in a flat bed without using bedrails?: A Lot Help needed moving from lying on your back to sitting on the side of a flat bed without using bedrails?: A Lot Help needed moving to and from a bed to a chair (including a wheelchair)?: A Lot Help needed standing up from a chair using your arms (e.g., wheelchair or bedside chair)?: A Lot Help needed to walk in hospital room?: Total Help needed climbing 3-5 steps with a railing? : Total 6 Click Score: 10    End of Session Equipment Utilized During Treatment: Oxygen Activity Tolerance: Patient limited by fatigue Patient left: in bed;with call bell/phone within reach;with family/visitor present Nurse Communication: Mobility status PT Visit Diagnosis: Muscle weakness (generalized) (M62.81);Unsteadiness on feet (R26.81)     Time: 1914-7829 PT Time Calculation (min) (ACUTE ONLY): 17 min  Charges:    $Therapeutic Activity: 8-22 mins PT General Charges $$ ACUTE PT VISIT: 1 Visit                     Jetta Lout PTA 05/12/23, 4:05 PM

## 2023-05-12 NOTE — Progress Notes (Signed)
Occupational Therapy Treatment Patient Details Name: Oaklee Sunga MRN: [id number] DOB: February 13, 1951 Today's Date: 05/12/2023   History of present illness Patient is a 72 year old male presenting with concerns for weakness. Found to have acute on chronic hypoxemic respiratory failure and diastolic congestive heart failure. History of iron deficiency anemia, COPD, CKD, hypertension, diabetes mellitus, depression, hyperlipidemia, multiple sclerosis   OT comments  Pt seen for OT treatment on this date. Upon arrival to room pt resting in bed, agreeable to tx. Pt performed UE exercises in bed to facilitate strength and mobility. Pt return demonstrated with min VC, spouse in room able to assist for HEP. Pt educated on incentive spirometer, and was appropriately using device at the end of the session. Pt making progress toward goals, will continue to follow POC. Discharge recommendation remains appropriate.     Recommendations for follow up therapy are one component of a multi-disciplinary discharge planning process, led by the attending physician.  Recommendations may be updated based on patient status, additional functional criteria and insurance authorization.    Assistance Recommended at Discharge Frequent or constant Supervision/Assistance  Patient can return home with the following  Two people to help with walking and/or transfers;A lot of help with bathing/dressing/bathroom;Assistance with cooking/housework;Assist for transportation;Direct supervision/assist for financial management   Equipment Recommendations  None recommended by OT    Recommendations for Other Services      Precautions / Restrictions Precautions Precautions: Fall Restrictions Weight Bearing Restrictions: No       Mobility Bed Mobility                    Transfers                         Balance                                           ADL either performed or assessed with  clinical judgement   ADL                                              Extremity/Trunk Assessment Upper Extremity Assessment Upper Extremity Assessment: Overall WFL for tasks assessed            Vision       Perception     Praxis      Cognition Arousal/Alertness: Awake/alert Behavior During Therapy: WFL for tasks assessed/performed Overall Cognitive Status: Within Functional Limits for tasks assessed                                 General Comments: Required repeated VC to return demonstrate HEP        Exercises General Exercises - Upper Extremity Shoulder ABduction: AROM, Strengthening, Both, 10 reps, Supine, Theraband Theraband Level (Shoulder Abduction): Level 1 (Yellow) Shoulder ADduction: AROM, Strengthening, Both, 10 reps, Supine, Theraband Theraband Level (Shoulder Adduction): Level 1 (Yellow) Elbow Flexion: AROM, Strengthening, Both, 10 reps, Supine, Theraband Theraband Level (Elbow Flexion): Level 1 (Yellow) Elbow Extension: AROM, Strengthening, Both, 10 reps, Supine, Theraband Theraband Level (Elbow Extension): Level 1 (Yellow)    Shoulder Instructions       General Comments Lengthy discussion about DC  needs and management of expectation. Both pt and spouse feel safe to DC home with proper equipment and [facility] services    Pertinent Vitals/ Pain       Pain Assessment Pain Assessment: No/denies pain  Home Living                                          Prior Functioning/Environment              Frequency  Min 1X/week        Progress Toward Goals  OT Goals(current goals can now be found in the care plan section)  Progress towards OT goals: Progressing toward goals  Acute Rehab OT Goals Patient Stated Goal: To get stronger and return home OT Goal Formulation: With patient Time For Goal Achievement: 05/23/23 Potential to Achieve Goals: Fair ADL Goals Pt Will Perform Grooming: with min  assist;standing Pt Will Perform Lower Body Dressing: with min assist;sit to/from stand Pt Will Transfer to Toilet: with min assist;ambulating Pt Will Perform Toileting - Clothing Manipulation and hygiene: with min assist;sit to/from stand  Plan Discharge plan remains appropriate    Co-evaluation        PT goals addressed during session: Mobility/safety with mobility        AM-PAC OT "6 Clicks" Daily Activity     Outcome Measure   Help from another person eating meals?: None Help from another person taking care of personal grooming?: A Little Help from another person toileting, which includes using toliet, bedpan, or urinal?: Total Help from another person bathing (including washing, rinsing, drying)?: A Lot Help from another person to put on and taking off regular upper body clothing?: A Lot Help from another person to put on and taking off regular lower body clothing?: Total 6 Click Score: 13    End of Session Equipment Utilized During Treatment: Oxygen  OT Visit Diagnosis: Unsteadiness on feet (R26.81);Repeated falls (R29.6);Muscle weakness (generalized) (M62.81)   Activity Tolerance Patient tolerated treatment well   Patient Left in chair;with call bell/phone within reach;with family/visitor present   Nurse Communication          Time: 6213-0865 OT Time Calculation (min): 12 min  Charges: OT General Charges $OT Visit: 1 Visit OT Treatments $Therapeutic Exercise: 8-22 mins Thresa Ross, OTS  05/12/2023, 4:15 PM

## 2023-05-12 NOTE — Plan of Care (Signed)
  Problem: Education: Goal: Ability to describe self-care measures that may prevent or decrease complications (Diabetes Survival Skills Education) will improve Outcome: Progressing   Problem: Coping: Goal: Ability to adjust to condition or change in health will improve Outcome: Progressing   Problem: Metabolic: Goal: Ability to maintain appropriate glucose levels will improve Outcome: Progressing   Problem: Nutritional: Goal: Maintenance of adequate nutrition will improve Outcome: Progressing   Problem: Education: Goal: Knowledge of General Education information will improve Description: Including pain rating scale, medication(s)/side effects and non-pharmacologic comfort measures Outcome: Progressing

## 2023-05-12 NOTE — TOC Initial Note (Signed)
Transition of Care  Medical Center) - Initial/Assessment Note    Patient Details  Name: Jeremy [name] MRN: [id number] Date of Birth: September 14, 1951  Transition of Care [facility]) CM/SW Contact:    Garret Reddish, RN Phone Number: 05/12/2023, 12:06 PM  Clinical Narrative:    Chart reviewed.  Noted that patient was admitted with  Symptomatic anemia.  I have spoken with Mrs. Lachman.  She informs me that prior to admission Mr. Macauley lived at home with his wife.  Mrs. Pech reports that patient was able to stand with the support of the lift chair.  Mrs. Desroches was able to take small steps and pivot to the [facility].    Mrs. Delia reports that patient has a BSC, walker, mobility scooter, Transport chair, electric wheelchair, and home 02.  Mrs. Omalley reports that she is working on obtaining a Water engineer to be able to transport patient easier.  Mrs. Mckay would like to get a sit to stand lift for her husband.  I have asked John with [employer] to speak with Mrs. Vita about the sit to stand lift.   Mrs. Clausing reports that patient is active with [facility] for home health PT.    I have informed Mrs. Bomberger of the recommendation for SNF on discharge for her husband.  Mrs. Yoho reports that he husband refuses to go to a SNF and she wants to take him.  Mrs. Dollar reports that she is was a CNA for 22 years and can manage the care of her husband at home.   She has requested ambulance transport on discharge.   TOC will continue to follow for discharge planning.                 Expected Discharge Plan: Home w Home Health Services Barriers to Discharge: No Barriers Identified   Patient Goals and CMS Choice   CMS Medicare.gov Compare Post Acute Care list provided to:: Patient Choice offered to / list presented to : Patient      Expected Discharge Plan and Services   Discharge Planning Services: CM Consult Post Acute Care Choice: Home Health Living arrangements for the past 2 months: Single Family Home                            HH Arranged:  (Patient is active with [facility] for home health PT)       Representative spoke with at [facility]: Barbara Cower  Prior Living Arrangements/Services Living arrangements for the past 2 months: Single Family Home Lives with:: Spouse Patient language and need for interpreter reviewed:: Yes Do you feel safe going back to the place where you live?: Yes        Care giver support system in place?: Yes (comment) (Patient has a supportive wife) Current home services: Home PT, DME (Patient has a BSC, Lift chair, Mobility scooter, Mining engineer wheelchair, Transport chair and transport chair.  Patient is active for Home Health PT with [facility]. Patient also has home 02 via [employer].)    Activities of Daily Living Home Assistive Devices/Equipment: Bedside commode/3-in-1, CBG Meter, Eyeglasses, Grab bars in shower, Oxygen, Shower chair with back, Walker (specify type) ADL Screening (condition at time of admission) Patient's cognitive ability adequate to safely complete daily activities?: Yes Is the patient deaf or have difficulty hearing?: No Does the patient have difficulty seeing, even when wearing glasses/contacts?: No Does the patient have difficulty concentrating, remembering, or making decisions?: No Patient able to express need  for assistance with ADLs?: Yes Does the patient have difficulty dressing or bathing?: Yes Independently performs ADLs?: No Communication: Independent Dressing (OT): Needs assistance Is this a change from baseline?: Pre-admission baseline Grooming: Needs assistance Is this a change from baseline?: Pre-admission baseline Feeding: Independent Bathing: Needs assistance Is this a change from baseline?: Pre-admission baseline Toileting: Needs assistance Is this a change from baseline?: Pre-admission baseline In/Out Bed: Needs assistance Is this a change from baseline?: Pre-admission baseline Walks in Home: Needs assistance Is this a change from  baseline?: Pre-admission baseline Does the patient have difficulty walking or climbing stairs?: Yes Weakness of Legs: Both Weakness of Arms/Hands: None  Permission Sought/Granted                  Emotional Assessment       Orientation: : Oriented to Self, Oriented to Place, Oriented to  Time, Oriented to Situation      Admission diagnosis:  Chronic blood loss anemia [D50.0] Weakness [R53.1] Symptomatic anemia [D64.9] Patient Active Problem List   Diagnosis Date Noted   Myocardial injury 05/11/2023   Moderate pulmonary hypertension (HCC) 05/09/2023   Cirrhosis of liver without ascites (HCC) 05/08/2023   Acute on chronic diastolic CHF (congestive heart failure) (HCC) 05/08/2023   Symptomatic anemia 05/06/2023   GI bleed 11/24/2022   Arteriovenous malformation (AVM) 10/21/2022   Weakness 08/18/2022   AKI (acute kidney injury) (HCC) 08/18/2022   Severe sepsis (HCC) 02/04/2022   Right lower lobe pneumonia 02/03/2022   Acute on chronic respiratory failure with hypoxia (HCC) 02/03/2022   Hyperkalemia 02/03/2022   COPD (chronic obstructive pulmonary disease) (HCC) 02/03/2022   SIRS (systemic inflammatory response syndrome), possible sepsis (HCC) 02/03/2022   Chronic blood loss anemia 09/24/2021   Gastric polyp    Gastritis without bleeding    Anemia in chronic kidney disease (CKD) 06/19/2021   Iron deficiency anemia due to chronic blood loss 06/19/2021   Gait abnormality 05/07/2021   Stage 3a chronic kidney disease (HCC) 05/20/2020   DM type 2 with diabetic peripheral neuropathy (HCC) 05/06/2020   Idiopathic peripheral neuropathy 01/15/2020   Generalized edema 01/15/2020   Primary pulmonary hypertension (HCC) 10/28/2019   Therapeutic drug monitoring 03/23/2018   Mild episode of recurrent major depressive disorder (HCC) 09/26/2017   Ventricular ectopic beats 09/26/2017   Type 2 diabetes mellitus with diabetic polyneuropathy, with long-term current use of insulin (HCC)  08/12/2017   Hyperlipidemia due to type 2 diabetes mellitus (HCC) 08/12/2017   B12 deficiency 12/14/2016   Low back pain 03/10/2016   Lumbosacral disc disease 01/01/2016   Neuropathy associated with endocrine disorder (HCC) 11/19/2015   Iron deficiency anemia 06/03/2015   Essential hypertension 06/03/2015   Hyperlipidemia 06/03/2015   Depression 06/03/2015   Gastroesophageal reflux disease without esophagitis 06/03/2015   Edema extremities 06/03/2015   Multiple sclerosis (HCC) 07/26/2013   Abnormality of gait 07/26/2013   Morbid obesity (HCC) 07/26/2013   PCP:  Duanne Limerick, MD Pharmacy:   [facility] [phone] Dan Humphreys, [place] - [address] AT [facility] OF [address] [place] [address] Phone: [phone] Fax: [phone]  [facility] - [place], [place] - [address] [place] [address] Phone: [phone] Fax: [phone]     Social Determinants of Health (SDOH) Social History: SDOH Screenings   Food Insecurity: No Food Insecurity (05/07/2023)  Housing: Low Risk  (05/07/2023)  Transportation Needs: No Transportation  Needs (05/07/2023)  Utilities: Not At Risk (05/07/2023)  Alcohol Screen: Low Risk  (11/16/2021)  Depression (PHQ2-9): Low Risk  (12/31/2022)  Financial Resource Strain: Low Risk  (11/19/2022)  Physical Activity: Insufficiently Active (11/19/2022)  Social Connections: Moderately Isolated (11/19/2022)  Stress: No Stress Concern Present (11/19/2022)  Tobacco Use: Medium Risk (05/06/2023)   SDOH Interventions:     Readmission Risk Interventions    11/25/2022   10:25 AM 02/04/2022    9:47 AM  Readmission Risk Prevention Plan  Transportation Screening Complete Complete  PCP or Specialist Appt within 3-5 Days Complete Complete  HRI or Home Care Consult Complete   Social Work Consult for Recovery Care Planning/Counseling Complete Complete  Palliative Care Screening Not  Applicable Not Applicable  Medication Review Oceanographer) Complete Complete

## 2023-05-13 ENCOUNTER — Encounter: Payer: Self-pay | Admitting: *Deleted

## 2023-05-13 DIAGNOSIS — N1831 Chronic kidney disease, stage 3a: Secondary | ICD-10-CM

## 2023-05-13 DIAGNOSIS — N189 Chronic kidney disease, unspecified: Secondary | ICD-10-CM | POA: Diagnosis not present

## 2023-05-13 DIAGNOSIS — J9621 Acute and chronic respiratory failure with hypoxia: Secondary | ICD-10-CM | POA: Diagnosis not present

## 2023-05-13 DIAGNOSIS — I5033 Acute on chronic diastolic (congestive) heart failure: Secondary | ICD-10-CM | POA: Diagnosis not present

## 2023-05-13 DIAGNOSIS — J189 Pneumonia, unspecified organism: Secondary | ICD-10-CM | POA: Diagnosis not present

## 2023-05-13 DIAGNOSIS — D500 Iron deficiency anemia secondary to blood loss (chronic): Secondary | ICD-10-CM | POA: Diagnosis not present

## 2023-05-13 LAB — GLUCOSE, CAPILLARY
Glucose-Capillary: 88 mg/dL (ref 70–99)
Glucose-Capillary: 127 mg/dL — ABNORMAL HIGH (ref 70–99)
Glucose-Capillary: 127 mg/dL — ABNORMAL HIGH (ref 70–99)
Glucose-Capillary: 136 mg/dL — ABNORMAL HIGH (ref 70–99)

## 2023-05-13 LAB — CULTURE, BLOOD (ROUTINE X 2)
Culture: NO GROWTH
Culture: NO GROWTH
Special Requests: ADEQUATE

## 2023-05-13 LAB — RETIC PANEL
Immature Retic Fract: 38.7 % — ABNORMAL HIGH (ref 2.3–15.9)
RBC.: 2.79 MIL/uL — ABNORMAL LOW (ref 4.22–5.81)
Retic Count, Absolute: 153.5 10*3/uL (ref 19.0–186.0)
Retic Ct Pct: 5.5 % — ABNORMAL HIGH (ref 0.4–3.1)
Reticulocyte Hemoglobin: 28.7 pg (ref >27.9)

## 2023-05-13 MED ORDER — POTASSIUM CHLORIDE CRYS ER 20 MEQ PO TBCR
20.0000 meq | EXTENDED_RELEASE_TABLET | Freq: Two times a day (BID) | ORAL | Status: DC
  Administered 2023-05-13 – 2023-05-14 (×2): 20 meq via ORAL
  Filled 2023-05-13 (×2): qty 1

## 2023-05-13 MED ORDER — SODIUM CHLORIDE 0.9 % IV SOLN
200.0000 mg | Freq: Once | INTRAVENOUS | Status: AC
  Administered 2023-05-13: 200 mg via INTRAVENOUS
  Filled 2023-05-13: qty 200

## 2023-05-13 MED ORDER — FUROSEMIDE 40 MG PO TABS
40.0000 mg | ORAL_TABLET | Freq: Two times a day (BID) | ORAL | Status: DC
  Administered 2023-05-13 – 2023-05-14 (×2): 40 mg via ORAL
  Filled 2023-05-13 (×2): qty 1

## 2023-05-13 MED ORDER — ACETAMINOPHEN 500 MG PO TABS
500.0000 mg | ORAL_TABLET | Freq: Four times a day (QID) | ORAL | Status: DC | PRN
  Administered 2023-05-13: 500 mg via ORAL

## 2023-05-13 NOTE — Plan of Care (Signed)
  Problem: Education: Goal: Ability to describe self-care measures that may prevent or decrease complications (Diabetes Survival Skills Education) will improve Outcome: Progressing Goal: Individualized Educational Video(s) Outcome: Progressing   Problem: Metabolic: Goal: Ability to maintain appropriate glucose levels will improve Outcome: Progressing   Problem: Nutritional: Goal: Maintenance of adequate nutrition will improve Outcome: Progressing   Problem: Skin Integrity: Goal: Risk for impaired skin integrity will decrease Outcome: Progressing

## 2023-05-13 NOTE — Progress Notes (Signed)
Hematology/Oncology Progress note [phone] [phone]     Patient Care Team: Duanne Limerick, MD as PCP - General (Family Medicine) Levert Feinstein, MD as Consulting Physician (Neurology) Gwyneth Revels, DPM as Consulting Physician (Podiatry) Sherlon Handing, MD as Consulting Physician (Internal Medicine) Jasper Riling, MD as Consulting Physician (Nephrology) Rickard Patience, MD as Consulting Physician (Oncology) Kemper Durie, RN as [facility] Care Management   Name of the patient: Jeremy [name]  [id number]  [birth year]  Date of visit: 05/13/23   INTERVAL HISTORY-   Status post EGD on 05/07/2023.-Nonbleeding gastric antral vascular ectasia Hemoglobin today 8.1 On 5-6 L nasal cannula oxygen.  7/17 cxr showed increase RLL opacification. On antibiotics .    Allergies  Allergen Reactions   Betadine [Povidone Iodine]     Patient Active Problem List   Diagnosis Date Noted   Iron deficiency anemia due to chronic blood loss 06/19/2021    Priority: High   Arteriovenous malformation (AVM) 10/21/2022    Priority: Medium    Anemia in chronic kidney disease (CKD) 06/19/2021    Priority: Medium    Myocardial injury 05/11/2023   Moderate pulmonary hypertension (HCC) 05/09/2023   Cirrhosis of liver without ascites (HCC) 05/08/2023   Acute on chronic diastolic CHF (congestive heart failure) (HCC) 05/08/2023   Symptomatic anemia 05/06/2023   GI bleed 11/24/2022   Generalized weakness 08/18/2022   AKI (acute kidney injury) (HCC) 08/18/2022   Severe sepsis (HCC) 02/04/2022   Right lower lobe pneumonia 02/03/2022   Acute on chronic respiratory failure with hypoxia (HCC) 02/03/2022   Hyperkalemia 02/03/2022   COPD (chronic obstructive pulmonary disease) (HCC) 02/03/2022   SIRS (systemic inflammatory response syndrome), possible sepsis (HCC) 02/03/2022   Chronic blood loss anemia 09/24/2021   Gastric polyp    Gastritis without bleeding    Gait  abnormality 05/07/2021   Stage 3a chronic kidney disease (HCC) 05/20/2020   DM type 2 with diabetic peripheral neuropathy (HCC) 05/06/2020   Idiopathic peripheral neuropathy 01/15/2020   Generalized edema 01/15/2020   Primary pulmonary hypertension (HCC) 10/28/2019   Therapeutic drug monitoring 03/23/2018   Mild episode of recurrent major depressive disorder (HCC) 09/26/2017   Ventricular ectopic beats 09/26/2017   Type 2 diabetes mellitus with diabetic polyneuropathy, with long-term current use of insulin (HCC) 08/12/2017   Hyperlipidemia due to type 2 diabetes mellitus (HCC) 08/12/2017   B12 deficiency 12/14/2016   Low back pain 03/10/2016   Lumbosacral disc disease 01/01/2016   Neuropathy associated with endocrine disorder (HCC) 11/19/2015   Iron deficiency anemia 06/03/2015   Essential hypertension 06/03/2015   Hyperlipidemia 06/03/2015   Depression 06/03/2015   Gastroesophageal reflux disease without esophagitis 06/03/2015   Edema extremities 06/03/2015   Multiple sclerosis (HCC) 07/26/2013   Abnormality of gait 07/26/2013   Morbid obesity (HCC) 07/26/2013     Past Medical History:  Diagnosis Date   Chronic pain    Depression    Diabetes (HCC)    GERD (gastroesophageal reflux disease)    GI bleed 12/2022   Hyperlipemia    Hypertension    MS (multiple sclerosis) (HCC)      Past Surgical History:  Procedure Laterality Date   BIOPSY  05/07/2023   Procedure: BIOPSY;  Surgeon: Regis Bill, MD;  Location: [facility] ENDOSCOPY;  Service: Endoscopy;;   COLECTOMY  06-2008   COLONOSCOPY  2015   normal   COLONOSCOPY WITH PROPOFOL N/A 09/10/2021   Procedure: COLONOSCOPY WITH PROPOFOL;  Surgeon: Midge Minium, MD;  Location: [facility] ENDOSCOPY;  Service: Endoscopy;  Laterality: N/A;   ENTEROSCOPY N/A 05/07/2023   Procedure: ENTEROSCOPY;  Surgeon: Regis Bill, MD;  Location: [facility] ENDOSCOPY;  Service: Endoscopy;  Laterality: N/A;   ESOPHAGOGASTRODUODENOSCOPY (EGD) WITH  PROPOFOL N/A 09/10/2021   Procedure: ESOPHAGOGASTRODUODENOSCOPY (EGD) WITH PROPOFOL;  Surgeon: Midge Minium, MD;  Location: [facility] ENDOSCOPY;  Service: Endoscopy;  Laterality: N/A;   GIVENS CAPSULE STUDY N/A 09/25/2021   Procedure: GIVENS CAPSULE STUDY;  Surgeon: Wyline Mood, MD;  Location: [facility] ENDOSCOPY;  Service: Gastroenterology;  Laterality: N/A;    Social History   Socioeconomic History   Marital status: Married    Spouse name: Olegario Messier   Number of children: 2   Years of education: GED   Highest education level: Not on file  Occupational History    Comment: Disabled  Tobacco Use   Smoking status: Former    Current packs/day: 0.00    Average packs/day: 1.5 packs/day for 30.0 years (45.0 ttl pk-yrs)    Types: Cigarettes    Start date: 01/24/1976    Quit date: 01/23/2006    Years since quitting: 17.3   Smokeless tobacco: Never   Tobacco comments:    N/A  Vaping Use   Vaping status: Never Used  Substance and Sexual Activity   Alcohol use: Not Currently    Comment: rare; maybe 2 beers a year   Drug use: No   Sexual activity: Not Currently  Other Topics Concern   Not on file  Social History Narrative   Patient is disabled.    Patient lives with his wife Loc Feinstein.    Patient has 2 children.       Social Determinants of Health   Financial Resource Strain: Low Risk  (11/19/2022)   Overall Financial Resource Strain (CARDIA)    Difficulty of Paying Living Expenses: Not hard at all  Food Insecurity: No Food Insecurity (05/07/2023)   Hunger Vital Sign    Worried About Running Out of Food in the Last Year: Never true    Ran Out of Food in the Last Year: Never true  Transportation Needs: No Transportation Needs (05/07/2023)   PRAPARE - Administrator, Civil Service (Medical): No    Lack of Transportation (Non-Medical): No  Physical Activity: Insufficiently Active (11/19/2022)   Exercise Vital Sign    Days of Exercise per Week: 3 days    Minutes of Exercise per  Session: 20 min  Stress: No Stress Concern Present (11/19/2022)   [employer] of Occupational Health - Occupational Stress Questionnaire    Feeling of Stress : Not at all  Social Connections: Moderately Isolated (11/19/2022)   Social Connection and Isolation Panel [NHANES]    Frequency of Communication with Friends and Family: More than three times a week    Frequency of Social Gatherings with Friends and Family: More than three times a week    Attends Religious Services: Never    Database administrator or Organizations: No    Attends Banker Meetings: Never    Marital Status: Married  Catering manager Violence: Not At Risk (05/07/2023)   Humiliation, Afraid, Rape, and Kick questionnaire    Fear of Current or Ex-Partner: No    Emotionally Abused: No    Physically Abused: No    Sexually Abused: No     Family History  Problem Relation Age of Onset   Lung cancer Mother    Heart attack Father    Heart attack Brother  COPD Brother    Diabetes Brother    Esophageal cancer Nephew      Current Facility-Administered Medications:    0.9 %  sodium chloride infusion, , Intravenous, PRN, Marrion Coy, MD, Last Rate: 10 mL/hr at 05/12/23 1720, New Bag at 05/12/23 1720   Interferon Beta-1b (BETASERON/EXTAVIA) injection 0.25 mg, 0.25 mg, Subcutaneous, QODAY, 0.25 mg at 05/11/23 1847 **AND** acetaminophen (TYLENOL) tablet 1,000 mg, 1,000 mg, Oral, Daily, Wieting, Richard, MD, 1,000 mg at 05/11/23 1846   acetaminophen (TYLENOL) tablet 500 mg, 500 mg, Oral, Q6H PRN, Lindajo Royal V, MD, 500 mg at 05/13/23 0937   albuterol (PROVENTIL) (2.5 MG/3ML) 0.083% nebulizer solution 2.5 mg, 2.5 mg, Nebulization, Q4H PRN, Marrion Coy, MD   [COMPLETED] azithromycin [facility]) tablet 500 mg, 500 mg, Oral, Daily, 500 mg at 05/11/23 1608 **FOLLOWED BY** azithromycin (ZITHROMAX) tablet 250 mg, 250 mg, Oral, Daily, Wieting, Richard, MD, 250 mg at 05/13/23 1610   cefTRIAXone (ROCEPHIN) 2 g in  sodium chloride 0.9 % 100 mL IVPB, 2 g, Intravenous, Q24H, Wieting, Richard, MD, Last Rate: 200 mL/hr at 05/12/23 1722, 2 g at 05/12/23 1722   cyanocobalamin (VITAMIN B12) tablet 1,000 mcg, 1,000 mcg, Oral, Daily, Cox, Amy N, DO, 1,000 mcg at 05/13/23 0921   furosemide (LASIX) tablet 20 mg, 20 mg, Oral, BID, Wieting, Richard, MD, 20 mg at 05/13/23 9604   insulin aspart (novoLOG) injection 0-5 Units, 0-5 Units, Subcutaneous, QHS, Cox, Amy N, DO   insulin aspart (novoLOG) injection 0-9 Units, 0-9 Units, Subcutaneous, TID WC, Cox, Amy N, DO, 1 Units at 05/13/23 1308   ipratropium-albuterol (DUONEB) 0.5-2.5 (3) MG/3ML nebulizer solution 3 mL, 3 mL, Nebulization, TID, Marrion Coy, MD, 3 mL at 05/13/23 1413   lidocaine (LIDODERM) 5 % 1 patch, 1 patch, Transdermal, Q24H, Marrion Coy, MD, 1 patch at 05/13/23 0923   pantoprazole (PROTONIX) EC tablet 40 mg, 40 mg, Oral, BID AC, Marrion Coy, MD, 40 mg at 05/13/23 5409   potassium chloride (KLOR-CON M) CR tablet 10 mEq, 10 mEq, Oral, BID, Wieting, Richard, MD, 10 mEq at 05/13/23 0918   pregabalin (LYRICA) capsule 200 mg, 200 mg, Oral, BID, Marrion Coy, MD, 200 mg at 05/13/23 0919   senna-docusate (Senokot-S) tablet 1 tablet, 1 tablet, Oral, QHS PRN, Cox, Amy N, DO   sertraline (ZOLOFT) tablet 25 mg, 25 mg, Oral, Daily, Cox, Amy N, DO, 25 mg at 05/13/23 0921   simvastatin (ZOCOR) tablet 40 mg, 40 mg, Oral, q1800, Cox, Amy N, DO, 40 mg at 05/12/23 1716   traMADol (ULTRAM) tablet 50 mg, 50 mg, Oral, Q6H PRN, Cox, Amy N, DO, 50 mg at 05/13/23 1316  Facility-Administered Medications Ordered in Other Encounters:    0.9 %  sodium chloride infusion, , Intravenous, Continuous, Rickard Patience, MD, Stopped at 11/04/22 1432   diphenhydrAMINE (BENADRYL) capsule 25 mg, 25 mg, Oral, Once, Rickard Patience, MD   Physical exam:  Vitals:   05/12/23 2024 05/13/23 0552 05/13/23 0740 05/13/23 0816  BP:  (!) 153/50  (!) 113/44  Pulse:  68  72  Resp:  20  20  Temp:  98.3 F (36.8 C)   98.6 F (37 C)  TempSrc:  Oral    SpO2: 94% (!) 20% 90% 90%  Weight:      Height:       Physical Exam Constitutional:      General: He is not in acute distress.    Appearance: He is not diaphoretic.  HENT:     Head: Normocephalic  and atraumatic.  Eyes:     General: No scleral icterus.    Pupils: Pupils are equal, round, and reactive to light.  Cardiovascular:     Rate and Rhythm: Normal rate.  Pulmonary:     Effort: Pulmonary effort is normal. No respiratory distress.     Comments: On nasal cannula oxygen Decreased breath sound bilaterally Abdominal:     General: There is no distension.     Palpations: Abdomen is soft.  Musculoskeletal:        General: Normal range of motion.     Cervical back: Normal range of motion and neck supple.  Skin:    General: Skin is warm and dry.     Findings: No erythema.  Neurological:     Mental Status: He is alert and oriented to person, place, and time. Mental status is at baseline.     Cranial Nerves: No cranial nerve deficit.     Motor: No abnormal muscle tone.  Psychiatric:        Mood and Affect: Mood and affect normal.       Labs    Latest Ref Rng & Units 05/12/2023    5:40 AM 05/11/2023    5:22 AM 05/10/2023    4:36 AM  CBC  WBC 4.0 - 10.5 K/uL  5.7  7.0   Hemoglobin 13.0 - 17.0 g/dL 8.1  8.2  8.4   Hematocrit 39.0 - 52.0 %  26.9  26.4   Platelets 150 - 400 K/uL  212  221       Latest Ref Rng & Units 05/12/2023    5:40 AM 05/11/2023    5:22 AM 05/10/2023    4:36 AM  CMP  Glucose 70 - 99 mg/dL 82  93  161   BUN 8 - 23 mg/dL 32  34  31   Creatinine 0.61 - 1.24 mg/dL 0.96  0.45  4.09   Sodium 135 - 145 mmol/L 134  138  136   Potassium 3.5 - 5.1 mmol/L 4.6  3.9  3.5   Chloride 98 - 111 mmol/L 104  108  105   CO2 22 - 32 mmol/L 25  26  26    Calcium 8.9 - 10.3 mg/dL 7.5  7.6  7.8      RADIOGRAPHIC STUDIES: I have personally reviewed the radiological images as listed and agreed with the findings in the report. DG Chest  2 View  Result Date: 05/11/2023 CLINICAL DATA:  Hypoxia, shortness of breath EXAM: CHEST - 2 VIEW COMPARISON:  Previous studies including the examination done on 05/08/2023 FINDINGS: Cardiac size is within normal limits. There is opacification of right lower lung field suggesting right pleural effusion and underlying atelectasis/pneumonia. There is interval worsening. There is faint 1.2 cm nodular density in right upper lung field with no significant change. In previous PET-CT done on 11/30/2021, levels no hypermetabolic activity in the nodule. Small linear density in left lower lung field may suggest minimal subsegmental atelectasis. Left lateral CP angle is clear. There is no pneumothorax. IMPRESSION: There is increased opacification in right lower lung fields suggesting increase in right pleural effusion and possibly worsening of underlying atelectasis/pneumonia. There is faint nodular density in right upper lung field with no significant change. Electronically Signed   By: Ernie Avena M.D.   On: 05/11/2023 14:23   ECHOCARDIOGRAM COMPLETE  Result Date: 05/09/2023    ECHOCARDIOGRAM REPORT   Patient Name:   KEYMARI SATO Date of Exam: 05/09/2023 Medical Rec #:  [id number]     Height:       70.0 in Accession #:    [id number]    Weight:       196.0 lb Date of Birth:  [birth year]     BSA:          2.069 m Patient Age:    72 years      BP:           128/47 mmHg Patient Gender: M             HR:           70 bpm. Exam Location:  [facility] Procedure: 2D Echo, Cardiac Doppler and Color Doppler Indications:     CHF---acute diastolic I50.31  History:         Patient has no prior history of Echocardiogram examinations.                  Risk Factors:Diabetes, Hypertension and Dyslipidemia.  Sonographer:     Cristela Blue Referring Phys:  [id number] Marrion Coy Diagnosing Phys: Lorine Bears MD  Sonographer Comments: Suboptimal apical window. IMPRESSIONS  1. Left ventricular ejection fraction, by estimation, is 55 to 60%. The  left ventricle has normal function. The left ventricle has no regional wall motion abnormalities. Left ventricular diastolic parameters were normal.  2. Right ventricular systolic function is normal. The right ventricular size is normal. There is moderately elevated pulmonary artery systolic pressure.  3. The mitral valve is normal in structure. No evidence of mitral valve regurgitation. No evidence of mitral stenosis.  4. The aortic valve is normal in structure. Aortic valve regurgitation is not visualized. Aortic valve sclerosis is present, with no evidence of aortic valve stenosis. FINDINGS  Left Ventricle: Left ventricular ejection fraction, by estimation, is 55 to 60%. The left ventricle has normal function. The left ventricle has no regional wall motion abnormalities. The left ventricular internal cavity size was normal in size. There is  no left ventricular hypertrophy. Left ventricular diastolic parameters were normal. Right Ventricle: The right ventricular size is normal. No increase in right ventricular wall thickness. Right ventricular systolic function is normal. There is moderately elevated pulmonary artery systolic pressure. The tricuspid regurgitant velocity is 3.27 m/s, and with an assumed right atrial pressure of 5 mmHg, the estimated right ventricular systolic pressure is 47.8 mmHg. Left Atrium: Left atrial size was normal in size. Right Atrium: Right atrial size was normal in size. Pericardium: There is no evidence of pericardial effusion. Mitral Valve: The mitral valve is normal in structure. No evidence of mitral valve regurgitation. No evidence of mitral valve stenosis. Tricuspid Valve: The tricuspid valve is normal in structure. Tricuspid valve regurgitation is mild . No evidence of tricuspid stenosis. Aortic Valve: The aortic valve is normal in structure. Aortic valve regurgitation is not visualized. Aortic valve sclerosis is present, with no evidence of aortic valve stenosis. Aortic valve  mean gradient measures 4.5 mmHg. Aortic valve peak gradient measures 8.5 mmHg. Aortic valve area, by VTI measures 2.96 cm. Pulmonic Valve: The pulmonic valve was normal in structure. Pulmonic valve regurgitation is not visualized. No evidence of pulmonic stenosis. Aorta: The aortic root is normal in size and structure. Venous: The inferior vena cava was not well visualized. IAS/Shunts: No atrial level shunt detected by color flow Doppler.  LEFT VENTRICLE PLAX 2D LVIDd:         5.10 cm   Diastology LVIDs:         3.60 cm  LV e' medial:    13.70 cm/s LV PW:         0.70 cm   LV E/e' medial:  8.0 LV IVS:        1.40 cm   LV e' lateral:   15.30 cm/s LVOT diam:     2.00 cm   LV E/e' lateral: 7.2 LV SV:         79 LV SV Index:   38 LVOT Area:     3.14 cm  RIGHT VENTRICLE RV Basal diam:  4.20 cm RV Mid diam:    4.10 cm LEFT ATRIUM             Index        RIGHT ATRIUM           Index LA diam:        3.70 cm 1.79 cm/m   RA Area:     19.80 cm LA Vol (A2C):   59.9 ml 28.95 ml/m  RA Volume:   63.10 ml  30.49 ml/m LA Vol (A4C):   54.5 ml 26.34 ml/m LA Biplane Vol: 62.2 ml 30.06 ml/m  AORTIC VALVE AV Area (Vmax):    2.58 cm AV Area (Vmean):   2.75 cm AV Area (VTI):     2.96 cm AV Vmax:           146.00 cm/s AV Vmean:          99.650 cm/s AV VTI:            0.268 m AV Peak Grad:      8.5 mmHg AV Mean Grad:      4.5 mmHg LVOT Vmax:         120.00 cm/s LVOT Vmean:        87.300 cm/s LVOT VTI:          0.253 m LVOT/AV VTI ratio: 0.94  AORTA Ao Root diam: 3.10 cm MITRAL VALVE                TRICUSPID VALVE MV Area (PHT): 3.24 cm     TR Peak grad:   42.8 mmHg MV Decel Time: 234 msec     TR Vmax:        327.00 cm/s MV E velocity: 110.00 cm/s MV A velocity: 99.60 cm/s   SHUNTS MV E/A ratio:  1.10         Systemic VTI:  0.25 m                             Systemic Diam: 2.00 cm Lorine Bears MD Electronically signed by Lorine Bears MD Signature Date/Time: 05/09/2023/10:24:36 AM    Final    DG Chest 2 View  Result Date:  05/08/2023 CLINICAL DATA:  Pneumonia, shortness of breath EXAM: CHEST - 2 VIEW COMPARISON:  05/06/2023 FINDINGS: Improved aeration at the left lung base. Right lower lobe airspace opacity with blunted right lateral costophrenic angle favoring pleural effusion and pneumonia/atelectasis. Stable peripheral 1.3 cm right upper lobe pulmonary nodule, reportedly previously not hypermetabolic on PET-CT. Prior cross-sectional imaging workups have not recommended follow up of this lesion. Heart size is within normal limits on the AP frontal projection. Atherosclerotic calcification of the aortic arch. IMPRESSION: 1. Right lower lobe airspace opacity with blunted right lateral costophrenic angle favoring pleural effusion and pneumonia/atelectasis. 2. Improved aeration at the left lung base. 3. Stable peripheral 1.3 cm right upper lobe pulmonary nodule, reportedly previously  not hypermetabolic on PET-CT. Prior cross-sectional imaging workups have not recommended follow up of this lesion. 4.  Aortic Atherosclerosis (ICD10-I70.0). Electronically Signed   By: Gaylyn Rong M.D.   On: 05/08/2023 19:27   US Abdomen Limited RUQ (LIVER/GB)  Result Date: 05/07/2023 CLINICAL DATA:  Liver cirrhosis. EXAM: ULTRASOUND ABDOMEN LIMITED RIGHT UPPER QUADRANT COMPARISON:  Body CT 02/03/2022 FINDINGS: Gallbladder: No gallbladder wall thickening visualized. No sonographic Murphy sign noted by sonographer. Two calculi are seen within the gallbladder measuring up to 1 cm. Common bile duct: Diameter: 5 mm Liver: No focal lesion identified. Increased parenchymal echogenicity and lobular contour. Portal vein is patent on color Doppler imaging with normal direction of blood flow towards the liver. Other: Trace ascitic fluid.  Right pleural effusion. IMPRESSION: Cirrhotic appearance of the liver. Cholelithiasis without sonographic evidence of cholecystitis. Electronically Signed   By: Ted Mcalpine M.D.   On: 05/07/2023 14:48   DG Chest  Portable 1 View  Result Date: 05/06/2023 CLINICAL DATA:  Shortness of breath EXAM: PORTABLE CHEST 1 VIEW COMPARISON:  Chest radiograph 11/24/2022 FINDINGS: The heart is enlarged.  The upper mediastinal contours are normal There are diffusely increased interstitial markings likely reflecting pulmonary interstitial edema. Previously seen opacity in the left midlung has resolved. There is no focal consolidation. There is a small right pleural effusion. There is no significant left effusion. There is no pneumothorax There is no acute osseous abnormality. IMPRESSION: Cardiomegaly with a small right pleural effusion and pulmonary interstitial edema. Findings may reflect CHF. Electronically Signed   By: Lesia Hausen M.D.   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  Hemoglobin has improved to 7.6.  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  Recommend one dose of Venofer.   # Pneumonia acute on chronic respiratory failure.  Continue Ceftriaxone and Azithromycin.  Taper down oxygen need as tolerated.   # CHF, on Lasix, watch kidney function.  # CKD Stage 3, avoid nephrotoxins.  # Liver cirrhosis, GI is on board.  Thank you for allowing me to participate in the care of this patient.   Rickard Patience, MD, PhD Hematology Oncology 05/13/2023

## 2023-05-13 NOTE — Progress Notes (Signed)
Progress Note   Patient: Jeremy [name] WJX:[id number] DOB: 1951-03-31 DOA: 05/06/2023     7 DOS: the patient was seen and examined on 05/13/2023   Brief hospital course: [name] is a 72 year old male with history of iron deficiency anemia presumed secondary to chronic blood loss in setting of possible venous malformation and Chronic gastritis, CKD stage II/IIIa, hypertension, insulin-dependent diabetes mellitus, depression, hyperlipidemia, who presents to the emergency department for chief concerns of weakness. Hemoglobin was 6.7, received blood transfusion.  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  Patient had increased short of breath in the morning of 7/14, gave dose of IV Lasix.  7/17.  Patient on 7 L nasal cannula.  Repeat chest x-ray showing likely pneumonia right lower lobe.  Antibiotics started.  Patient also placed on Lasix for lower extremity swelling and shortness of breath. 7/18.  Patient down to 5 L nasal cannula. 7/19.  Patient down to 4 L this morning but looks like up to 6 L this afternoon.  Will increase Lasix to 40 mg twice daily.  Assessment and Plan: * Acute on chronic respiratory failure with hypoxia [facility]) Patient had worsening oxygenation on 7/14 with pulse ox of 84% on 3 L.  Patient on 4 L nasal cannula this morning (baseline oxygen 2 L).  With repeat chest x-ray showing right lower lobe pneumonia I started antibiotics on 7/17.  Continue to taper oxygen.  Increase Lasix dose to 40 mg twice daily  Right lower lobe pneumonia Rocephin and Zithromax started on 7/17.  Acute on chronic diastolic CHF (congestive heart failure) (HCC) Increase Lasix 40 mg orally twice daily with potassium.  Continue to monitor.  Generalized weakness Physical therapy recommending rehab but patient may be interested in going home.  He needs to improve with PT and OT for this decision.  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  Continue to monitor hemoglobin.  Last hemoglobin 8.1.  Hematology ordered IV iron today.  Stage 3a chronic kidney disease (HCC) Last creatinine 1.32 with a GFR of 58.  Will cut back Lasix to 20 mg orally twice a day.  COPD (chronic obstructive pulmonary disease) (HCC) Continue nebulizers and inhalers  Multiple sclerosis (HCC) On Betaseron injection  Type 2 diabetes mellitus with diabetic polyneuropathy, with long-term current use of insulin (HCC) On sliding scale insulin and Lyrica.  Last hemoglobin A1c 1C actually low at 4.4 back in January.  Myocardial injury Secondary to blood loss anemia  Cirrhosis of liver without ascites (HCC) Continue to monitor hemoglobin  Anemia in chronic kidney disease (CKD) Last hemoglobin 8.1, last ferritin 160.  Gastroesophageal reflux disease without esophagitis On Protonix  Depression On Zoloft  Hyperlipidemia On Zocor  Essential hypertension On Lasix        Subjective: Patient feeling okay.  Offers no complaints.  States he is breathing a little bit better.  On 4 L nasal cannula this morning.  Initially admitted with weakness and fatigue and anemia.  Physical Exam: Vitals:   05/12/23 2024 05/13/23 0552 05/13/23 0740 05/13/23 0816  BP:  (!) 153/50  (!) 113/44  Pulse:  68  72  Resp:  20  20  Temp:  98.3 F (36.8 C)  98.6 F (37 C)  TempSrc:  Oral    SpO2: 94% (!) 20% 90% 90%  Weight:      Height:       Physical Exam HENT:     Head:  Normocephalic.     Mouth/Throat:     Pharynx: No oropharyngeal exudate.  Eyes:     General: Lids are normal.     Conjunctiva/sclera: Conjunctivae normal.  Cardiovascular:     Rate and Rhythm: Normal rate and regular rhythm.     Heart sounds: Normal heart sounds, S1 normal and S2 normal.  Pulmonary:     Breath sounds: Examination of the right-lower field reveals decreased breath sounds. Examination of the left-lower field reveals decreased breath sounds.  Decreased breath sounds present. No rhonchi.  Abdominal:     Palpations: Abdomen is soft.     Tenderness: There is no abdominal tenderness.  Musculoskeletal:     Right lower leg: Swelling present.     Left lower leg: Swelling present.  Skin:    General: Skin is warm.     Comments: Chronic lower extremity skin discoloration  Neurological:     Mental Status: He is alert and oriented to person, place, and time.     Data Reviewed: Last creatinine 1.32, last hemoglobin 8.1  Family Communication: Spoke with wife at the bedside  Disposition: Status is: Inpatient Remains inpatient appropriate because: Continue to try to taper oxygen down to baseline 2 L.  Was on 4 L this morning but looks like on 6 L this afternoon.  Planned Discharge Destination: Rehab versus home with home health depending on progress    Time spent: 28 minutes  Author: Alford Highland, MD 05/13/2023 3:34 PM  For on call review [email or web address].

## 2023-05-14 DIAGNOSIS — J9621 Acute and chronic respiratory failure with hypoxia: Secondary | ICD-10-CM | POA: Diagnosis not present

## 2023-05-14 DIAGNOSIS — R531 Weakness: Secondary | ICD-10-CM | POA: Diagnosis not present

## 2023-05-14 DIAGNOSIS — I5033 Acute on chronic diastolic (congestive) heart failure: Secondary | ICD-10-CM | POA: Diagnosis not present

## 2023-05-14 DIAGNOSIS — J189 Pneumonia, unspecified organism: Secondary | ICD-10-CM | POA: Diagnosis not present

## 2023-05-14 LAB — CBC
HCT: 25 % — ABNORMAL LOW (ref 39.0–52.0)
Hemoglobin: 7.9 g/dL — ABNORMAL LOW (ref 13.0–17.0)
MCH: 28.8 pg (ref 26.0–34.0)
MCHC: 31.6 g/dL (ref 30.0–36.0)
MCV: 91.2 fL (ref 80.0–100.0)
Platelets: 220 10*3/uL (ref 150–400)
RBC: 2.74 MIL/uL — ABNORMAL LOW (ref 4.22–5.81)
RDW: 19.4 % — ABNORMAL HIGH (ref 11.5–15.5)
WBC: 4.9 10*3/uL (ref 4.0–10.5)
nRBC: 0 % (ref 0.0–0.2)

## 2023-05-14 LAB — GLUCOSE, CAPILLARY
Glucose-Capillary: 82 mg/dL (ref 70–99)
Glucose-Capillary: 156 mg/dL — ABNORMAL HIGH (ref 70–99)
Glucose-Capillary: 109 mg/dL — ABNORMAL HIGH (ref 70–99)
Glucose-Capillary: 211 mg/dL — ABNORMAL HIGH (ref 70–99)

## 2023-05-14 LAB — BASIC METABOLIC PANEL
Anion gap: 6 (ref 5–15)
BUN: 30 mg/dL — ABNORMAL HIGH (ref 8–23)
CO2: 24 mmol/L (ref 22–32)
Calcium: 7.6 mg/dL — ABNORMAL LOW (ref 8.9–10.3)
Chloride: 103 mmol/L (ref 98–111)
Creatinine, Ser: 1.31 mg/dL — ABNORMAL HIGH (ref 0.61–1.24)
GFR, Estimated: 58 mL/min — ABNORMAL LOW (ref >60)
Glucose, Bld: 76 mg/dL (ref 70–99)
Potassium: 4.7 mmol/L (ref 3.5–5.1)
Sodium: 133 mmol/L — ABNORMAL LOW (ref 135–145)

## 2023-05-14 MED ORDER — FUROSEMIDE 20 MG PO TABS
20.0000 mg | ORAL_TABLET | Freq: Two times a day (BID) | ORAL | Status: DC
  Administered 2023-05-14 – 2023-05-15 (×2): 20 mg via ORAL
  Filled 2023-05-14 (×2): qty 1

## 2023-05-14 MED ORDER — POTASSIUM CHLORIDE CRYS ER 10 MEQ PO TBCR
10.0000 meq | EXTENDED_RELEASE_TABLET | Freq: Two times a day (BID) | ORAL | Status: DC
  Administered 2023-05-14 – 2023-05-15 (×2): 10 meq via ORAL
  Filled 2023-05-14 (×2): qty 1

## 2023-05-14 NOTE — Progress Notes (Signed)
Progress Note   Patient: Jeremy [name] [name]:[id number] DOB: 1950/12/06 DOA: 05/06/2023     8 DOS: the patient was seen and examined on 05/14/2023   Brief hospital course: [name] is a 72 year old male with history of iron deficiency anemia presumed secondary to chronic blood loss in setting of possible venous malformation and Chronic gastritis, CKD stage II/IIIa, hypertension, insulin-dependent diabetes mellitus, depression, hyperlipidemia, who presents to the emergency department for chief concerns of weakness. Hemoglobin was 6.7, received blood transfusion.  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  Patient had increased short of breath in the morning of 7/14, gave dose of IV Lasix.  7/17.  Patient on 7 L nasal cannula.  Repeat chest x-ray showing likely pneumonia right lower lobe.  Antibiotics started.  Patient also placed on Lasix for lower extremity swelling and shortness of breath. 7/18.  Patient down to 5 L nasal cannula. 7/19.  Patient down to 4 L this morning but looks like up to 6 L this afternoon.  Will increase Lasix to 40 mg twice daily. 7/20.  Patient on 6 L nasal cannula this morning but I dialed down to 5.  Asked nursing staff to dial down to 4 L of pulse ox greater than 88%.  Today's hemoglobin 7.9.  Patient desaturates with minimal movement.  Assessment and Plan: * Acute on chronic respiratory failure with hypoxia [facility]) Patient had worsening oxygenation on 7/14 with pulse ox of 84% on 3 L.  Patient on 6 L nasal cannula this morning (baseline oxygen 2 L).  With repeat chest x-ray showing right lower lobe pneumonia I started antibiotics on 7/17.  Continue to taper oxygen.  I taper down to 5 L this morning and asked nursing staff to taper down to 4 to keep pulse ox greater than 88%.  Likely will need 6 L of oxygen with ambulation.  Decrease Lasix dose to 20 mg twice daily.  Right lower lobe pneumonia Rocephin and  Zithromax started on 7/17.  Acute on chronic diastolic CHF (congestive heart failure) (HCC) Decrease Lasix 20 mg orally twice daily with potassium.  Continue to monitor.  Generalized weakness Physical therapy recommending rehab but patient may be interested in going home.  He needs to improve with PT and OT for this decision.  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  Continue to monitor hemoglobin.  Last hemoglobin 7.9.  Hematology ordered IV iron on 7/19.  Patient may end up needing another transfusion.  Stage 3a chronic kidney disease (HCC) Last creatinine 1.31 with a GFR of 58.  Will cut back Lasix to 20 mg orally twice a day.  COPD (chronic obstructive pulmonary disease) (HCC) Continue nebulizers and inhalers  Multiple sclerosis (HCC) On Betaseron injection  Type 2 diabetes mellitus with diabetic polyneuropathy, with long-term current use of insulin (HCC) On sliding scale insulin and Lyrica.  Last hemoglobin A1c 1C actually low at 4.4 back in January.  Myocardial injury Secondary to blood loss anemia  Cirrhosis of liver without ascites (HCC) Continue to monitor hemoglobin  Anemia in chronic kidney disease (CKD) Last hemoglobin 8.1, last ferritin 160.  Gastroesophageal reflux disease without esophagitis On Protonix  Depression On Zoloft  Hyperlipidemia On Zocor  Essential hypertension On Lasix        Subjective: Patient feels okay.  Offers no complaints.  No shortness of breath.  Still on more oxygen here than at home.  Baseline wears 2 L nasal cannula.  Was on 6 L this morning.  Admitted with shortness of breath and anemia.  Physical Exam: Vitals:   05/13/23 2108 05/14/23 0458 05/14/23 0744 05/14/23 0816  BP: (!) 120/39 (!) 133/55  (!) 124/45  Pulse: 68 67  73  Resp: 16 12  17   Temp: 97.9 F (36.6 C) 98.1 F (36.7 C)  98.2 F (36.8 C)  TempSrc: Oral Oral  Oral  SpO2: 96% 93% 91% 94%  Weight:      Height:        Physical Exam HENT:     Head: Normocephalic.     Mouth/Throat:     Pharynx: No oropharyngeal exudate.  Eyes:     General: Lids are normal.     Conjunctiva/sclera: Conjunctivae normal.  Cardiovascular:     Rate and Rhythm: Normal rate and regular rhythm.     Heart sounds: Normal heart sounds, S1 normal and S2 normal.  Pulmonary:     Breath sounds: Examination of the right-lower field reveals decreased breath sounds. Examination of the left-lower field reveals decreased breath sounds. Decreased breath sounds present. No rhonchi.  Abdominal:     Palpations: Abdomen is soft.     Tenderness: There is no abdominal tenderness.  Musculoskeletal:     Right lower leg: Swelling present.     Left lower leg: Swelling present.  Skin:    General: Skin is warm.     Comments: Chronic lower extremity skin discoloration  Neurological:     Mental Status: He is alert and oriented to person, place, and time.     Data Reviewed: Labs reviewed.  Last hemoglobin 7.9, creatinine 1.31, sodium 133  Family Communication: Spoke with wife at the bedside  Disposition: Status is: Inpatient Remains inpatient appropriate because: Was on 6 L nasal cannula this morning.  Idella down to 5.  Asked nursing staff to dial down to 4 to keep pulse ox greater than 88%.  Planned Discharge Destination: Home with Home Health versus rehab    Time spent: 28 minutes  Author: Alford Highland, MD 05/14/2023 12:34 PM  For on call review [email or web address].

## 2023-05-14 NOTE — Progress Notes (Signed)
Mobility Specialist - Progress Note   05/14/23 1352  Mobility  Activity Transferred to/from [facility]  Level of Assistance +2 (takes two people)  Location manager Ambulated (ft) 1 ft  Activity Response Tolerated well  Mobility Referral Yes  $Mobility charge 1 Mobility  Mobility Specialist Start Time (ACUTE ONLY) 0137  Mobility Specialist Stop Time (ACUTE ONLY) 0152  Mobility Specialist Time Calculation (min) (ACUTE ONLY) 15 min   Pt sitting on BSC on 6L upon arrival. Pt STS x2 and transfers from [facility] to bed MaxA +2. Pt repositions in bed ModA. Pt left supine in bed with needs in reach, bed alarm activated, and family present.   Terrilyn Saver  Mobility Specialist  05/14/23 1:54 PM

## 2023-05-15 ENCOUNTER — Inpatient Hospital Stay: Payer: Medicare Other

## 2023-05-15 DIAGNOSIS — D500 Iron deficiency anemia secondary to blood loss (chronic): Secondary | ICD-10-CM | POA: Diagnosis not present

## 2023-05-15 DIAGNOSIS — J189 Pneumonia, unspecified organism: Secondary | ICD-10-CM | POA: Diagnosis not present

## 2023-05-15 DIAGNOSIS — N189 Chronic kidney disease, unspecified: Secondary | ICD-10-CM | POA: Diagnosis not present

## 2023-05-15 DIAGNOSIS — I5033 Acute on chronic diastolic (congestive) heart failure: Secondary | ICD-10-CM | POA: Diagnosis not present

## 2023-05-15 DIAGNOSIS — J9621 Acute and chronic respiratory failure with hypoxia: Secondary | ICD-10-CM | POA: Diagnosis not present

## 2023-05-15 DIAGNOSIS — J449 Chronic obstructive pulmonary disease, unspecified: Secondary | ICD-10-CM

## 2023-05-15 LAB — CBC
HCT: 25.9 % — ABNORMAL LOW (ref 39.0–52.0)
Hemoglobin: 8 g/dL — ABNORMAL LOW (ref 13.0–17.0)
MCH: 28.4 pg (ref 26.0–34.0)
MCHC: 30.9 g/dL (ref 30.0–36.0)
MCV: 91.8 fL (ref 80.0–100.0)
Platelets: 227 10*3/uL (ref 150–400)
RBC: 2.82 MIL/uL — ABNORMAL LOW (ref 4.22–5.81)
RDW: 19.1 % — ABNORMAL HIGH (ref 11.5–15.5)
WBC: 4.8 10*3/uL (ref 4.0–10.5)
nRBC: 0 % (ref 0.0–0.2)

## 2023-05-15 LAB — COMPREHENSIVE METABOLIC PANEL
ALT: 18 U/L (ref 0–44)
AST: 33 U/L (ref 15–41)
Albumin: 2 g/dL — ABNORMAL LOW (ref 3.5–5.0)
Alkaline Phosphatase: 108 U/L (ref 38–126)
Anion gap: 4 — ABNORMAL LOW (ref 5–15)
BUN: 32 mg/dL — ABNORMAL HIGH (ref 8–23)
CO2: 24 mmol/L (ref 22–32)
Calcium: 7.7 mg/dL — ABNORMAL LOW (ref 8.9–10.3)
Chloride: 104 mmol/L (ref 98–111)
Creatinine, Ser: 1.37 mg/dL — ABNORMAL HIGH (ref 0.61–1.24)
GFR, Estimated: 55 mL/min — ABNORMAL LOW (ref >60)
Glucose, Bld: 80 mg/dL (ref 70–99)
Potassium: 4.8 mmol/L (ref 3.5–5.1)
Sodium: 132 mmol/L — ABNORMAL LOW (ref 135–145)
Total Bilirubin: 0.6 mg/dL (ref 0.3–1.2)
Total Protein: 5.7 g/dL — ABNORMAL LOW (ref 6.5–8.1)

## 2023-05-15 LAB — GLUCOSE, CAPILLARY
Glucose-Capillary: 74 mg/dL (ref 70–99)
Glucose-Capillary: 130 mg/dL — ABNORMAL HIGH (ref 70–99)
Glucose-Capillary: 130 mg/dL — ABNORMAL HIGH (ref 70–99)
Glucose-Capillary: 166 mg/dL — ABNORMAL HIGH (ref 70–99)

## 2023-05-15 MED ORDER — EPOETIN ALFA 10000 UNIT/ML IJ SOLN
20000.0000 [IU] | INTRAMUSCULAR | Status: DC
  Filled 2023-05-15 (×2): qty 2

## 2023-05-15 MED ORDER — POTASSIUM CHLORIDE CRYS ER 20 MEQ PO TBCR
40.0000 meq | EXTENDED_RELEASE_TABLET | Freq: Two times a day (BID) | ORAL | Status: DC
  Administered 2023-05-15 – 2023-05-16 (×2): 40 meq via ORAL
  Filled 2023-05-15 (×2): qty 2

## 2023-05-15 MED ORDER — FUROSEMIDE 10 MG/ML IJ SOLN
40.0000 mg | Freq: Two times a day (BID) | INTRAMUSCULAR | Status: DC
  Administered 2023-05-15 – 2023-05-19 (×8): 40 mg via INTRAVENOUS
  Filled 2023-05-15 (×8): qty 4

## 2023-05-15 MED ORDER — FUROSEMIDE 20 MG PO TABS
20.0000 mg | ORAL_TABLET | Freq: Once | ORAL | Status: AC
  Administered 2023-05-15: 20 mg via ORAL
  Filled 2023-05-15: qty 1

## 2023-05-15 MED ORDER — FUROSEMIDE 40 MG PO TABS
40.0000 mg | ORAL_TABLET | Freq: Two times a day (BID) | ORAL | Status: DC
  Filled 2023-05-15: qty 1

## 2023-05-15 NOTE — TOC Progression Note (Signed)
Transition of Care [facility]) - Progression Note    Patient Details  Name: Jeremy [name] MRN: [id number] Date of Birth: 08-Jan-1951  [facility] CM/SW Contact  Garret Reddish, RN Phone Number: 05/15/2023, 1:21 PM  Clinical Narrative:   Chart reviewed.  Noted that patient continues to receive treatment for PNA.  Patient is currently on IV Rocephin.  Continuing to wean 02.  Oncology following.    TOC will continue to follow for discharge planning.      Expected Discharge Plan: Home w Home Health Services Barriers to Discharge: No Barriers Identified  Expected Discharge Plan and Services   Discharge Planning Services: CM Consult Post Acute Care Choice: Home Health Living arrangements for the past 2 months: Single Family Home                           HH Arranged:  (Patient is active with [facility] for home health PT)       Representative spoke with at [facility] Agency: Barbara Cower   Social Determinants of Health (SDOH) Interventions SDOH Screenings   Food Insecurity: No Food Insecurity (05/07/2023)  Housing: Low Risk  (05/07/2023)  Transportation Needs: No Transportation Needs (05/07/2023)  Utilities: Not At Risk (05/07/2023)  Alcohol Screen: Low Risk  (11/16/2021)  Depression (PHQ2-9): Low Risk  (12/31/2022)  Financial Resource Strain: Low Risk  (11/19/2022)  Physical Activity: Insufficiently Active (11/19/2022)  Social Connections: Moderately Isolated (11/19/2022)  Stress: No Stress Concern Present (11/19/2022)  Tobacco Use: Medium Risk (05/06/2023)    Readmission Risk Interventions    11/25/2022   10:25 AM 02/04/2022    9:47 AM  Readmission Risk Prevention Plan  Transportation Screening Complete Complete  PCP or Specialist Appt within 3-5 Days Complete Complete  HRI or Home Care Consult Complete   Social Work Consult for Recovery Care Planning/Counseling Complete Complete  Palliative Care Screening Not Applicable Not Applicable  Medication Review Oceanographer) Complete  Complete

## 2023-05-15 NOTE — Progress Notes (Signed)
Progress Note   Patient: Jeremy [name] UJW:[id number] DOB: Jul 26, 1951 DOA: 05/06/2023     9 DOS: the patient was seen and examined on 05/15/2023   Brief hospital course: Mr. Sollie Vultaggio is a 72 year old male with history of iron deficiency anemia presumed secondary to chronic blood loss in setting of possible venous malformation and Chronic gastritis, CKD stage II/IIIa, hypertension, insulin-dependent diabetes mellitus, depression, hyperlipidemia, who presents to the emergency department for chief concerns of weakness. Hemoglobin was 6.7, received blood transfusion.  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  Patient had increased short of breath in the morning of 7/14, gave dose of IV Lasix.  7/17.  Patient on 7 L nasal cannula.  Repeat chest x-ray showing likely pneumonia right lower lobe.  Antibiotics started.  Patient also placed on Lasix for lower extremity swelling and shortness of breath. 7/18.  Patient down to 5 L nasal cannula. 7/19.  Patient down to 4 L this morning but looks like up to 6 L this afternoon.  Will increase Lasix to 40 mg twice daily. 7/20.  Patient on 6 L nasal cannula this morning but I dialed down to 5.  Asked nursing staff to dial down to 4 L of pulse ox greater than 88%.  Today's hemoglobin 7.9.  Patient desaturates with minimal movement. 7/21.  Patient on high flow nasal cannula 5.5 L this morning.  Today's hemoglobin 8.0.  Assessment and Plan: * Acute on chronic respiratory failure with hypoxia [facility]) Patient had worsening oxygenation on 7/14 with pulse ox of 84% on 3 L.  Patient on 6 L nasal cannula this morning (baseline oxygen 2 L).  With repeat chest x-ray showing right lower lobe pneumonia I started antibiotics on 7/17.  Placed on high flow nasal cannula 5.5 L.  Right lower lobe pneumonia Rocephin and Zithromax started on 7/17.  Not commented on today's chest x-ray.  Acute on chronic diastolic CHF  (congestive heart failure) (HCC) With quite a bit of weight gain during the hospital stay and will switch to Lasix to 40 mg IV twice daily and increase potassium.  Small pleural effusion on last chest x-ray.  Generalized weakness Physical therapy recommending rehab but patient may be interested in going home.  He needs to improve with PT and OT for this decision.  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  Continue to monitor hemoglobin.  Last hemoglobin 8.0.  Hematology ordered IV iron on 7/19.  Patient may end up needing another transfusion.  Hematology ordered for another dose of Epogen for tomorrow.  Stage 3a chronic kidney disease (HCC) Last creatinine 1.37 with a GFR of 55.  Will increase Lasix to 40 mg IV twice daily  COPD (chronic obstructive pulmonary disease) (HCC) Continue nebulizers and inhalers  Multiple sclerosis (HCC) On Betaseron injection  Type 2 diabetes mellitus with diabetic polyneuropathy, with long-term current use of insulin (HCC) On sliding scale insulin and Lyrica.  Last hemoglobin A1c 1C actually low at 4.4 back in January.  Myocardial injury Secondary to blood loss anemia  Cirrhosis of liver without ascites (HCC) Continue to monitor hemoglobin  Anemia in chronic kidney disease (CKD) Last hemoglobin 8.1, last ferritin 160.  Gastroesophageal reflux disease without esophagitis On Protonix  Depression On Zoloft  Hyperlipidemia On Zocor  Essential hypertension On Lasix        Subjective: Patient again does not offer any complaints.  Feels his breathing is okay.  Patient placed back  on high flow nasal cannula 5.5 L.  Initially admitted with anemia.  Physical Exam: Vitals:   05/15/23 0747 05/15/23 0820 05/15/23 1351 05/15/23 1614  BP:  (!) 133/50  (!) 145/59  Pulse:  70  71  Resp:  16  16  Temp:  98.9 F (37.2 C)  98.5 F (36.9 C)  TempSrc:      SpO2: 92% 93% 94% 96%  Weight:      Height:        Physical Exam HENT:     Head: Normocephalic.     Mouth/Throat:     Pharynx: No oropharyngeal exudate.  Eyes:     General: Lids are normal.     Conjunctiva/sclera: Conjunctivae normal.  Cardiovascular:     Rate and Rhythm: Normal rate and regular rhythm.     Heart sounds: Normal heart sounds, S1 normal and S2 normal.  Pulmonary:     Breath sounds: Examination of the right-lower field reveals decreased breath sounds. Examination of the left-lower field reveals decreased breath sounds. Decreased breath sounds present. No rhonchi.  Abdominal:     Palpations: Abdomen is soft.     Tenderness: There is no abdominal tenderness.  Musculoskeletal:     Right lower leg: Swelling present.     Left lower leg: Swelling present.  Skin:    General: Skin is warm.     Comments: Chronic lower extremity skin discoloration  Neurological:     Mental Status: He is alert and oriented to person, place, and time.     Data Reviewed: Patient has had quite a bit of weight gain since the 12th.  Not quite sure how accurate these bed weights are. Sodium 132, creatinine 1.37, hemoglobin 8.0, platelet count 227  Family Communication: Spoke with wife at the bedside  Disposition: Status is: Inpatient Remains inpatient appropriate because: Placed back on high flow nasal cannula  Planned Discharge Destination: Home with Home Health    Time spent: 28 minutes  Author: Alford Highland, MD 05/15/2023 4:54 PM  For on call review [email or web address].

## 2023-05-15 NOTE — Progress Notes (Signed)
Hematology/Oncology Progress note [phone] [phone]     Patient Care Team: Duanne Limerick, MD as PCP - General (Family Medicine) Levert Feinstein, MD as Consulting Physician (Neurology) Gwyneth Revels, DPM as Consulting Physician (Podiatry) Sherlon Handing, MD as Consulting Physician (Internal Medicine) Jasper Riling, MD as Consulting Physician (Nephrology) Rickard Patience, MD as Consulting Physician (Oncology) Kemper Durie, RN as [facility] Care Management   Name of the patient: Jeremy [name]  [id number]  November 12, 1950  Date of visit: 05/15/23   INTERVAL HISTORY-   Status post EGD on 05/07/2023.-Nonbleeding gastric antral vascular ectasia Hemoglobin today 8.1 Still on 5-6 L nasal cannula oxygen.  7/17 cxr showed increase RLL opacification. On antibiotics . Family members at the bedside.  Per wife, patient still remains dark.   Allergies  Allergen Reactions   Betadine [Povidone Iodine]     Patient Active Problem List   Diagnosis Date Noted   Iron deficiency anemia due to chronic blood loss 06/19/2021    Priority: High   Arteriovenous malformation (AVM) 10/21/2022    Priority: Medium    Anemia in chronic kidney disease (CKD) 06/19/2021    Priority: Medium    Myocardial injury 05/11/2023   Moderate pulmonary hypertension (HCC) 05/09/2023   Cirrhosis of liver without ascites (HCC) 05/08/2023   Acute on chronic diastolic CHF (congestive heart failure) (HCC) 05/08/2023   Symptomatic anemia 05/06/2023   GI bleed 11/24/2022   Generalized weakness 08/18/2022   AKI (acute kidney injury) (HCC) 08/18/2022   Severe sepsis (HCC) 02/04/2022   Right lower lobe pneumonia 02/03/2022   Acute on chronic respiratory failure with hypoxia (HCC) 02/03/2022   Hyperkalemia 02/03/2022   COPD (chronic obstructive pulmonary disease) (HCC) 02/03/2022   SIRS (systemic inflammatory response syndrome), possible sepsis (HCC) 02/03/2022   Chronic blood loss  anemia 09/24/2021   Gastric polyp    Gastritis without bleeding    Gait abnormality 05/07/2021   Stage 3a chronic kidney disease (HCC) 05/20/2020   DM type 2 with diabetic peripheral neuropathy (HCC) 05/06/2020   Idiopathic peripheral neuropathy 01/15/2020   Generalized edema 01/15/2020   Primary pulmonary hypertension (HCC) 10/28/2019   Therapeutic drug monitoring 03/23/2018   Mild episode of recurrent major depressive disorder (HCC) 09/26/2017   Ventricular ectopic beats 09/26/2017   Type 2 diabetes mellitus with diabetic polyneuropathy, with long-term current use of insulin (HCC) 08/12/2017   Hyperlipidemia due to type 2 diabetes mellitus (HCC) 08/12/2017   B12 deficiency 12/14/2016   Low back pain 03/10/2016   Lumbosacral disc disease 01/01/2016   Neuropathy associated with endocrine disorder (HCC) 11/19/2015   Iron deficiency anemia 06/03/2015   Essential hypertension 06/03/2015   Hyperlipidemia 06/03/2015   Depression 06/03/2015   Gastroesophageal reflux disease without esophagitis 06/03/2015   Edema extremities 06/03/2015   Multiple sclerosis (HCC) 07/26/2013   Abnormality of gait 07/26/2013   Morbid obesity (HCC) 07/26/2013     Past Medical History:  Diagnosis Date   Chronic pain    Depression    Diabetes (HCC)    GERD (gastroesophageal reflux disease)    GI bleed 12/2022   Hyperlipemia    Hypertension    MS (multiple sclerosis) (HCC)      Past Surgical History:  Procedure Laterality Date   BIOPSY  05/07/2023   Procedure: BIOPSY;  Surgeon: Regis Bill, MD;  Location: [facility] ENDOSCOPY;  Service: Endoscopy;;   COLECTOMY  06-2008   COLONOSCOPY  2015   normal   COLONOSCOPY WITH PROPOFOL N/A 09/10/2021  Procedure: COLONOSCOPY WITH PROPOFOL;  Surgeon: Midge Minium, MD;  Location: [facility] ENDOSCOPY;  Service: Endoscopy;  Laterality: N/A;   ENTEROSCOPY N/A 05/07/2023   Procedure: ENTEROSCOPY;  Surgeon: Regis Bill, MD;  Location: [facility] ENDOSCOPY;  Service:  Endoscopy;  Laterality: N/A;   ESOPHAGOGASTRODUODENOSCOPY (EGD) WITH PROPOFOL N/A 09/10/2021   Procedure: ESOPHAGOGASTRODUODENOSCOPY (EGD) WITH PROPOFOL;  Surgeon: Midge Minium, MD;  Location: [facility] ENDOSCOPY;  Service: Endoscopy;  Laterality: N/A;   GIVENS CAPSULE STUDY N/A 09/25/2021   Procedure: GIVENS CAPSULE STUDY;  Surgeon: Wyline Mood, MD;  Location: [facility] ENDOSCOPY;  Service: Gastroenterology;  Laterality: N/A;    Social History   Socioeconomic History   Marital status: Married    Spouse name: Jeremy [name]   Number of children: 2   Years of education: GED   Highest education level: Not on file  Occupational History    Comment: Disabled  Tobacco Use   Smoking status: Former    Current packs/day: 0.00    Average packs/day: 1.5 packs/day for 30.0 years (45.0 ttl pk-yrs)    Types: Cigarettes    Start date: 01/24/1976    Quit date: 01/23/2006    Years since quitting: 17.3   Smokeless tobacco: Never   Tobacco comments:    N/A  Vaping Use   Vaping status: Never Used  Substance and Sexual Activity   Alcohol use: Not Currently    Comment: rare; maybe 2 beers a year   Drug use: No   Sexual activity: Not Currently  Other Topics Concern   Not on file  Social History Narrative   Patient is disabled.    Patient lives with his wife Jeremy [name].    Patient has 2 children.       Social Determinants of Health   Financial Resource Strain: Low Risk  (11/19/2022)   Overall Financial Resource Strain (CARDIA)    Difficulty of Paying Living Expenses: Not hard at all  Food Insecurity: No Food Insecurity (05/07/2023)   Hunger Vital Sign    Worried About Running Out of Food in the Last Year: Never true    Ran Out of Food in the Last Year: Never true  Transportation Needs: No Transportation Needs (05/07/2023)   PRAPARE - Administrator, Civil Service (Medical): No    Lack of Transportation (Non-Medical): No  Physical Activity: Insufficiently Active (11/19/2022)   Exercise Vital Sign     Days of Exercise per Week: 3 days    Minutes of Exercise per Session: 20 min  Stress: No Stress Concern Present (11/19/2022)   [employer] of Occupational Health - Occupational Stress Questionnaire    Feeling of Stress : Not at all  Social Connections: Moderately Isolated (11/19/2022)   Social Connection and Isolation Panel [NHANES]    Frequency of Communication with Friends and Family: More than three times a week    Frequency of Social Gatherings with Friends and Family: More than three times a week    Attends Religious Services: Never    Database administrator or Organizations: No    Attends Banker Meetings: Never    Marital Status: Married  Catering manager Violence: Not At Risk (05/07/2023)   Humiliation, Afraid, Rape, and Kick questionnaire    Fear of Current or Ex-Partner: No    Emotionally Abused: No    Physically Abused: No    Sexually Abused: No     Family History  Problem Relation Age of Onset   Lung cancer Mother    Heart  attack Father    Heart attack Brother    COPD Brother    Diabetes Brother    Esophageal cancer Nephew      Current Facility-Administered Medications:    0.9 %  sodium chloride infusion, , Intravenous, PRN, Marrion Coy, MD, Last Rate: 10 mL/hr at 05/12/23 1720, New Bag at 05/12/23 1720   Interferon Beta-1b (BETASERON/EXTAVIA) injection 0.25 mg, 0.25 mg, Subcutaneous, QODAY, 0.25 mg at 05/13/23 1734 **AND** acetaminophen (TYLENOL) tablet 1,000 mg, 1,000 mg, Oral, Daily, Wieting, Richard, MD, 1,000 mg at 05/14/23 1634   acetaminophen (TYLENOL) tablet 500 mg, 500 mg, Oral, Q6H PRN, Andris Baumann, MD, 500 mg at 05/13/23 0937   albuterol (PROVENTIL) (2.5 MG/3ML) 0.083% nebulizer solution 2.5 mg, 2.5 mg, Nebulization, Q4H PRN, Marrion Coy, MD   cefTRIAXone (ROCEPHIN) 2 g in sodium chloride 0.9 % 100 mL IVPB, 2 g, Intravenous, Q24H, Wieting, Richard, MD, Last Rate: 200 mL/hr at 05/14/23 1637, 2 g at 05/14/23 1637   cyanocobalamin  (VITAMIN B12) tablet 1,000 mcg, 1,000 mcg, Oral, Daily, Cox, Amy N, DO, 1,000 mcg at 05/15/23 0953   furosemide (LASIX) tablet 40 mg, 40 mg, Oral, BID, Wieting, Richard, MD   insulin aspart (novoLOG) injection 0-5 Units, 0-5 Units, Subcutaneous, QHS, Cox, Amy N, DO, 2 Units at 05/14/23 2108   insulin aspart (novoLOG) injection 0-9 Units, 0-9 Units, Subcutaneous, TID WC, Cox, Amy N, DO, 1 Units at 05/15/23 1228   ipratropium-albuterol (DUONEB) 0.5-2.5 (3) MG/3ML nebulizer solution 3 mL, 3 mL, Nebulization, TID, Marrion Coy, MD, 3 mL at 05/15/23 0747   lidocaine (LIDODERM) 5 % 1 patch, 1 patch, Transdermal, Q24H, Marrion Coy, MD, 1 patch at 05/15/23 1112   pantoprazole (PROTONIX) EC tablet 40 mg, 40 mg, Oral, BID AC, Marrion Coy, MD, 40 mg at 05/15/23 0857   potassium chloride (KLOR-CON M) CR tablet 10 mEq, 10 mEq, Oral, BID, Renae Gloss, Richard, MD, 10 mEq at 05/15/23 0954   pregabalin (LYRICA) capsule 200 mg, 200 mg, Oral, BID, Marrion Coy, MD, 200 mg at 05/15/23 0858   senna-docusate (Senokot-S) tablet 1 tablet, 1 tablet, Oral, QHS PRN, Cox, Amy N, DO   sertraline (ZOLOFT) tablet 25 mg, 25 mg, Oral, Daily, Cox, Amy N, DO, 25 mg at 05/15/23 0952   simvastatin (ZOCOR) tablet 40 mg, 40 mg, Oral, q1800, Cox, Amy N, DO, 40 mg at 05/14/23 1633   traMADol (ULTRAM) tablet 50 mg, 50 mg, Oral, Q6H PRN, Cox, Amy N, DO, 50 mg at 05/15/23 1610  Facility-Administered Medications Ordered in Other Encounters:    0.9 %  sodium chloride infusion, , Intravenous, Continuous, Rickard Patience, MD, Stopped at 11/04/22 1432   diphenhydrAMINE (BENADRYL) capsule 25 mg, 25 mg, Oral, Once, Rickard Patience, MD   Physical exam:  Vitals:   05/15/23 0218 05/15/23 0522 05/15/23 0747 05/15/23 0820  BP:  138/62  (!) 133/50  Pulse:  67  70  Resp:  20  16  Temp:  98.5 F (36.9 C)  98.9 F (37.2 C)  TempSrc:  Oral    SpO2:  92% 92% 93%  Weight: 216 lb 14.9 oz (98.4 kg)     Height:       Physical Exam Constitutional:      General:  He is not in acute distress.    Appearance: He is not diaphoretic.  HENT:     Head: Normocephalic and atraumatic.  Eyes:     General: No scleral icterus. Cardiovascular:     Rate and Rhythm: Normal rate.  Pulmonary:     Effort: Pulmonary effort is normal. No respiratory distress.     Comments: On nasal cannula oxygen Decreased breath sound bilaterally Abdominal:     General: There is no distension.  Musculoskeletal:        General: Normal range of motion.  Skin:    General: Skin is warm and dry.     Findings: No erythema.  Neurological:     Mental Status: He is alert and oriented to person, place, and time. Mental status is at baseline.     Motor: No abnormal muscle tone.  Psychiatric:        Mood and Affect: Mood and affect normal.       Labs    Latest Ref Rng & Units 05/15/2023    5:44 AM 05/14/2023    6:29 AM 05/12/2023    5:40 AM  CBC  WBC 4.0 - 10.5 K/uL 4.8  4.9    Hemoglobin 13.0 - 17.0 g/dL 8.0  7.9  8.1   Hematocrit 39.0 - 52.0 % 25.9  25.0    Platelets 150 - 400 K/uL 227  220        Latest Ref Rng & Units 05/15/2023    5:44 AM 05/14/2023    6:29 AM 05/12/2023    5:40 AM  CMP  Glucose 70 - 99 mg/dL 80  76  82   BUN 8 - 23 mg/dL 32  30  32   Creatinine 0.61 - 1.24 mg/dL 1.61  0.96  0.45   Sodium 135 - 145 mmol/L 132  133  134   Potassium 3.5 - 5.1 mmol/L 4.8  4.7  4.6   Chloride 98 - 111 mmol/L 104  103  104   CO2 22 - 32 mmol/L 24  24  25    Calcium 8.9 - 10.3 mg/dL 7.7  7.6  7.5   Total Protein 6.5 - 8.1 g/dL 5.7     Total Bilirubin 0.3 - 1.2 mg/dL 0.6     Alkaline Phos 38 - 126 U/L 108     AST 15 - 41 U/L 33     ALT 0 - 44 U/L 18        RADIOGRAPHIC STUDIES: I have personally reviewed the radiological images as listed and agreed with the findings in the report. DG Chest 2 View  Result Date: 05/11/2023 CLINICAL DATA:  Hypoxia, shortness of breath EXAM: CHEST - 2 VIEW COMPARISON:  Previous studies including the examination done on 05/08/2023  FINDINGS: Cardiac size is within normal limits. There is opacification of right lower lung field suggesting right pleural effusion and underlying atelectasis/pneumonia. There is interval worsening. There is faint 1.2 cm nodular density in right upper lung field with no significant change. In previous PET-CT done on 11/30/2021, levels no hypermetabolic activity in the nodule. Small linear density in left lower lung field may suggest minimal subsegmental atelectasis. Left lateral CP angle is clear. There is no pneumothorax. IMPRESSION: There is increased opacification in right lower lung fields suggesting increase in right pleural effusion and possibly worsening of underlying atelectasis/pneumonia. There is faint nodular density in right upper lung field with no significant change. Electronically Signed   By: Ernie Avena M.D.   On: 05/11/2023 14:23   ECHOCARDIOGRAM COMPLETE  Result Date: 05/09/2023    ECHOCARDIOGRAM REPORT   Patient Name:   JONAVEN HILGERS Date of Exam: 05/09/2023 Medical Rec #:  [id number]     Height:       70.0 in  Accession #:    [id number]    Weight:       196.0 lb Date of Birth:  09-26-1951     BSA:          2.069 m Patient Age:    72 years      BP:           128/47 mmHg Patient Gender: M             HR:           70 bpm. Exam Location:  [facility] Procedure: 2D Echo, Cardiac Doppler and Color Doppler Indications:     CHF---acute diastolic I50.31  History:         Patient has no prior history of Echocardiogram examinations.                  Risk Factors:Diabetes, Hypertension and Dyslipidemia.  Sonographer:     Cristela Blue Referring Phys:  [id number] Marrion Coy Diagnosing Phys: Lorine Bears MD  Sonographer Comments: Suboptimal apical window. IMPRESSIONS  1. Left ventricular ejection fraction, by estimation, is 55 to 60%. The left ventricle has normal function. The left ventricle has no regional wall motion abnormalities. Left ventricular diastolic parameters were normal.  2. Right ventricular  systolic function is normal. The right ventricular size is normal. There is moderately elevated pulmonary artery systolic pressure.  3. The mitral valve is normal in structure. No evidence of mitral valve regurgitation. No evidence of mitral stenosis.  4. The aortic valve is normal in structure. Aortic valve regurgitation is not visualized. Aortic valve sclerosis is present, with no evidence of aortic valve stenosis. FINDINGS  Left Ventricle: Left ventricular ejection fraction, by estimation, is 55 to 60%. The left ventricle has normal function. The left ventricle has no regional wall motion abnormalities. The left ventricular internal cavity size was normal in size. There is  no left ventricular hypertrophy. Left ventricular diastolic parameters were normal. Right Ventricle: The right ventricular size is normal. No increase in right ventricular wall thickness. Right ventricular systolic function is normal. There is moderately elevated pulmonary artery systolic pressure. The tricuspid regurgitant velocity is 3.27 m/s, and with an assumed right atrial pressure of 5 mmHg, the estimated right ventricular systolic pressure is 47.8 mmHg. Left Atrium: Left atrial size was normal in size. Right Atrium: Right atrial size was normal in size. Pericardium: There is no evidence of pericardial effusion. Mitral Valve: The mitral valve is normal in structure. No evidence of mitral valve regurgitation. No evidence of mitral valve stenosis. Tricuspid Valve: The tricuspid valve is normal in structure. Tricuspid valve regurgitation is mild . No evidence of tricuspid stenosis. Aortic Valve: The aortic valve is normal in structure. Aortic valve regurgitation is not visualized. Aortic valve sclerosis is present, with no evidence of aortic valve stenosis. Aortic valve mean gradient measures 4.5 mmHg. Aortic valve peak gradient measures 8.5 mmHg. Aortic valve area, by VTI measures 2.96 cm. Pulmonic Valve: The pulmonic valve was normal in  structure. Pulmonic valve regurgitation is not visualized. No evidence of pulmonic stenosis. Aorta: The aortic root is normal in size and structure. Venous: The inferior vena cava was not well visualized. IAS/Shunts: No atrial level shunt detected by color flow Doppler.  LEFT VENTRICLE PLAX 2D LVIDd:         5.10 cm   Diastology LVIDs:         3.60 cm   LV e' medial:    13.70 cm/s LV PW:  0.70 cm   LV E/e' medial:  8.0 LV IVS:        1.40 cm   LV e' lateral:   15.30 cm/s LVOT diam:     2.00 cm   LV E/e' lateral: 7.2 LV SV:         79 LV SV Index:   38 LVOT Area:     3.14 cm  RIGHT VENTRICLE RV Basal diam:  4.20 cm RV Mid diam:    4.10 cm LEFT ATRIUM             Index        RIGHT ATRIUM           Index LA diam:        3.70 cm 1.79 cm/m   RA Area:     19.80 cm LA Vol (A2C):   59.9 ml 28.95 ml/m  RA Volume:   63.10 ml  30.49 ml/m LA Vol (A4C):   54.5 ml 26.34 ml/m LA Biplane Vol: 62.2 ml 30.06 ml/m  AORTIC VALVE AV Area (Vmax):    2.58 cm AV Area (Vmean):   2.75 cm AV Area (VTI):     2.96 cm AV Vmax:           146.00 cm/s AV Vmean:          99.650 cm/s AV VTI:            0.268 m AV Peak Grad:      8.5 mmHg AV Mean Grad:      4.5 mmHg LVOT Vmax:         120.00 cm/s LVOT Vmean:        87.300 cm/s LVOT VTI:          0.253 m LVOT/AV VTI ratio: 0.94  AORTA Ao Root diam: 3.10 cm MITRAL VALVE                TRICUSPID VALVE MV Area (PHT): 3.24 cm     TR Peak grad:   42.8 mmHg MV Decel Time: 234 msec     TR Vmax:        327.00 cm/s MV E velocity: 110.00 cm/s MV A velocity: 99.60 cm/s   SHUNTS MV E/A ratio:  1.10         Systemic VTI:  0.25 m                             Systemic Diam: 2.00 cm Lorine Bears MD Electronically signed by Lorine Bears MD Signature Date/Time: 05/09/2023/10:24:36 AM    Final    DG Chest 2 View  Result Date: 05/08/2023 CLINICAL DATA:  Pneumonia, shortness of breath EXAM: CHEST - 2 VIEW COMPARISON:  05/06/2023 FINDINGS: Improved aeration at the left lung base. Right lower lobe  airspace opacity with blunted right lateral costophrenic angle favoring pleural effusion and pneumonia/atelectasis. Stable peripheral 1.3 cm right upper lobe pulmonary nodule, reportedly previously not hypermetabolic on PET-CT. Prior cross-sectional imaging workups have not recommended follow up of this lesion. Heart size is within normal limits on the AP frontal projection. Atherosclerotic calcification of the aortic arch. IMPRESSION: 1. Right lower lobe airspace opacity with blunted right lateral costophrenic angle favoring pleural effusion and pneumonia/atelectasis. 2. Improved aeration at the left lung base. 3. Stable peripheral 1.3 cm right upper lobe pulmonary nodule, reportedly previously not hypermetabolic on PET-CT. Prior cross-sectional imaging workups have not recommended follow up of this lesion. 4.  Aortic Atherosclerosis (ICD10-I70.0). Electronically Signed   By: Gaylyn Rong M.D.   On: 05/08/2023 19:27   US Abdomen Limited RUQ (LIVER/GB)  Result Date: 05/07/2023 CLINICAL DATA:  Liver cirrhosis. EXAM: ULTRASOUND ABDOMEN LIMITED RIGHT UPPER QUADRANT COMPARISON:  Body CT 02/03/2022 FINDINGS: Gallbladder: No gallbladder wall thickening visualized. No sonographic Murphy sign noted by sonographer. Two calculi are seen within the gallbladder measuring up to 1 cm. Common bile duct: Diameter: 5 mm Liver: No focal lesion identified. Increased parenchymal echogenicity and lobular contour. Portal vein is patent on color Doppler imaging with normal direction of blood flow towards the liver. Other: Trace ascitic fluid.  Right pleural effusion. IMPRESSION: Cirrhotic appearance of the liver. Cholelithiasis without sonographic evidence of cholecystitis. Electronically Signed   By: Ted Mcalpine M.D.   On: 05/07/2023 14:48   DG Chest Portable 1 View  Result Date: 05/06/2023 CLINICAL DATA:  Shortness of breath EXAM: PORTABLE CHEST 1 VIEW COMPARISON:  Chest radiograph 11/24/2022 FINDINGS: The heart is  enlarged.  The upper mediastinal contours are normal There are diffusely increased interstitial markings likely reflecting pulmonary interstitial edema. Previously seen opacity in the left midlung has resolved. There is no focal consolidation. There is a small right pleural effusion. There is no significant left effusion. There is no pneumothorax There is no acute osseous abnormality. IMPRESSION: Cardiomegaly with a small right pleural effusion and pulmonary interstitial edema. Findings may reflect CHF. Electronically Signed   By: Lesia Hausen M.D.   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  Hemoglobin has improved to 7.6.  Status post EGD, nonbleeding GAVE disease Hb is stable. Last dose of Epogen 20,000 was on 05/09/2023. Repeat another dose of Epogen on 05/16/23  # Pneumonia acute on chronic respiratory failure.  Continue Azithromycin.  Taper down oxygen need as tolerated.   # CHF, on Lasix, watch kidney function.  # CKD Stage 3, avoid nephrotoxins.   Thank you for allowing me to participate in the care of this patient.   Rickard Patience, MD, PhD Hematology Oncology 05/15/2023

## 2023-05-16 DIAGNOSIS — I5033 Acute on chronic diastolic (congestive) heart failure: Secondary | ICD-10-CM | POA: Diagnosis not present

## 2023-05-16 DIAGNOSIS — J9621 Acute and chronic respiratory failure with hypoxia: Secondary | ICD-10-CM | POA: Diagnosis not present

## 2023-05-16 DIAGNOSIS — N1831 Chronic kidney disease, stage 3a: Secondary | ICD-10-CM | POA: Diagnosis not present

## 2023-05-16 DIAGNOSIS — D500 Iron deficiency anemia secondary to blood loss (chronic): Secondary | ICD-10-CM | POA: Diagnosis not present

## 2023-05-16 LAB — BASIC METABOLIC PANEL
Anion gap: 7 (ref 5–15)
BUN: 28 mg/dL — ABNORMAL HIGH (ref 8–23)
CO2: 24 mmol/L (ref 22–32)
Calcium: 7.9 mg/dL — ABNORMAL LOW (ref 8.9–10.3)
Chloride: 103 mmol/L (ref 98–111)
Creatinine, Ser: 1.19 mg/dL (ref 0.61–1.24)
GFR, Estimated: >60 mL/min (ref >60)
Glucose, Bld: 74 mg/dL (ref 70–99)
Potassium: 4.9 mmol/L (ref 3.5–5.1)
Sodium: 134 mmol/L — ABNORMAL LOW (ref 135–145)

## 2023-05-16 LAB — GLUCOSE, CAPILLARY
Glucose-Capillary: 73 mg/dL (ref 70–99)
Glucose-Capillary: 158 mg/dL — ABNORMAL HIGH (ref 70–99)
Glucose-Capillary: 145 mg/dL — ABNORMAL HIGH (ref 70–99)
Glucose-Capillary: 126 mg/dL — ABNORMAL HIGH (ref 70–99)

## 2023-05-16 LAB — HEMOGLOBIN: Hemoglobin: 7.8 g/dL — ABNORMAL LOW (ref 13.0–17.0)

## 2023-05-16 LAB — TYPE AND SCREEN: Antibody Screen: NEGATIVE

## 2023-05-16 LAB — PREPARE RBC (CROSSMATCH)

## 2023-05-16 MED ORDER — POTASSIUM CHLORIDE CRYS ER 20 MEQ PO TBCR
20.0000 meq | EXTENDED_RELEASE_TABLET | Freq: Two times a day (BID) | ORAL | Status: DC
  Administered 2023-05-17 – 2023-05-20 (×7): 20 meq via ORAL
  Filled 2023-05-16 (×7): qty 1

## 2023-05-16 MED ORDER — SODIUM CHLORIDE 0.9% IV SOLUTION: Freq: Once | INTRAVENOUS | Status: AC

## 2023-05-16 NOTE — Progress Notes (Signed)
Physical Therapy Treatment Patient Details Name: Jeremy [name] MRN: [id number] DOB: [birth year] Today's Date: 05/16/2023   History of Present Illness Patient is a 72 year old male presenting with concerns for weakness. Found to have acute on chronic hypoxemic respiratory failure and diastolic congestive heart failure. History of iron deficiency anemia, COPD, CKD, hypertension, diabetes mellitus, depression, hyperlipidemia, multiple sclerosis    PT Comments  Pt received in Semi-Fowler's position and agreeable to therapy.  Pt seen for PT/OT co-treatment for safety and equipment management throughout the session.  Pt continues to require maxA for bed mobility supplied by caregiver in order to reduce overall pain of the low back.  Pt then utilized minA+2 to perform STS with RW in place.  [name] wanted to utilize a STS device as she is interested in purchasing one for home use.  Therapists brought device in and edcuated on how to perform the STS with the use of it and management of nasal cannula with use as well.  Pt did drop to 72% SpO2 on 4L, and was bumped up to 6L.  Pt took several minutes of sitting and pursed lip breathing to get >90%.  Nursing notified of this drop and pt being left on 6L at conclusion of the session.  Pt left with spouse and all needs met.      Assistance Recommended at Discharge Frequent or constant Supervision/Assistance  If plan is discharge home, recommend the following:  Can travel by private vehicle    A lot of help with walking and/or transfers;A lot of help with bathing/dressing/bathroom;Assistance with cooking/housework;Direct supervision/assist for medications management;Direct supervision/assist for financial management;Assist for transportation;Help with stairs or ramp for entrance      Equipment Recommendations  Other (comment)    Recommendations for Other Services       Precautions / Restrictions Precautions Precautions: Fall Restrictions Weight  Bearing Restrictions: No     Mobility  Bed Mobility Overal bed mobility: Needs Assistance Bed Mobility: Supine to Sit     Supine to sit: Max assist, HOB elevated          Transfers Overall transfer level: Needs assistance     Sit to Stand: Min assist, +2 safety/equipment           General transfer comment: Jeremy [name] STS used to transfer pt to recliner per family request to see independence for home use.    Ambulation/Gait                   Stairs             Wheelchair Mobility     Tilt Bed    Modified Rankin (Stroke Patients Only)       Balance Overall balance assessment: Needs assistance Sitting-balance support: Feet supported Sitting balance-Leahy Scale: Fair     Standing balance support: Bilateral upper extremity supported, Reliant on assistive device for balance Standing balance-Leahy Scale: Poor Standing balance comment: pt remains high fall risk                            Cognition Arousal/Alertness: Awake/alert Behavior During Therapy: WFL for tasks assessed/performed Overall Cognitive Status: Within Functional Limits for tasks assessed                                          Exercises  General Comments        Pertinent Vitals/Pain Pain Assessment Pain Assessment: Faces Faces Pain Scale: Hurts little more Pain Location: bottocks, back Pain Descriptors / Indicators: Discomfort, Grimacing Pain Intervention(s): Monitored during session, Repositioned    Home Living                          Prior Function            PT Goals (current goals can now be found in the care plan section) Acute Rehab PT Goals Patient Stated Goal: " Get better and go home." PT Goal Formulation: With patient/family Time For Goal Achievement: 05/23/23 Potential to Achieve Goals: Fair Progress towards PT goals: Progressing toward goals    Frequency    Min 1X/week      PT Plan Current plan  remains appropriate    Co-evaluation   Reason for Co-Treatment: Complexity of the patient's impairments (multi-system involvement)   OT goals addressed during session: ADL's and self-care;Strengthening/ROM      AM-PAC PT "6 Clicks" Mobility   Outcome Measure  Help needed turning from your back to your side while in a flat bed without using bedrails?: A Lot Help needed moving from lying on your back to sitting on the side of a flat bed without using bedrails?: A Lot Help needed moving to and from a bed to a chair (including a wheelchair)?: A Lot Help needed standing up from a chair using your arms (e.g., wheelchair or bedside chair)?: A Lot Help needed to walk in hospital room?: Total Help needed climbing 3-5 steps with a railing? : Total 6 Click Score: 10    End of Session Equipment Utilized During Treatment: Oxygen;Gait belt Activity Tolerance: Patient limited by fatigue Patient left: in chair;with call bell/phone within reach;with chair alarm set;with nursing/sitter in room Nurse Communication: Mobility status PT Visit Diagnosis: Muscle weakness (generalized) (M62.81);Unsteadiness on feet (R26.81)     Time: 1610-9604 PT Time Calculation (min) (ACUTE ONLY): 37 min  Charges:    $Therapeutic Activity: 8-22 mins PT General Charges $$ ACUTE PT VISIT: 1 Visit                     Nolon Bussing, PT, DPT Physical Therapist - [facility]  05/16/23, 2:21 PM

## 2023-05-16 NOTE — Progress Notes (Signed)
Mobility Specialist - Progress Note   05/16/23 1506  Mobility  Activity Transferred from chair to bed  Level of Assistance Dependent, patient does less than 25%  Assistive Device Sabina Lift  Activity Response Tolerated well  $Mobility charge 1 Mobility  Mobility Specialist Start Time (ACUTE ONLY) 1439  Mobility Specialist Stop Time (ACUTE ONLY) 1500  Mobility Specialist Time Calculation (min) (ACUTE ONLY) 21 min   Pt sitting in the recliner, utilizing RA. Pt requesting to transfer to bed. Pt transferred to bed via STS lift, RN present during transfer. Pt expresses some L-sided pain during transfer, RN notified. Pt left supine with alarm set and needs within reach. RN and family member present at bedside.  Zetta Bills Mobility Specialist 05/16/23 3:10 PM

## 2023-05-16 NOTE — Progress Notes (Signed)
Occupational Therapy Treatment Patient Details Name: Jeremy [name] MRN: [id number] DOB: 1950/11/27 Today's Date: 05/16/2023   History of present illness Patient is a 72 year old male presenting with concerns for weakness. Found to have acute on chronic hypoxemic respiratory failure and diastolic congestive heart failure. History of iron deficiency anemia, COPD, CKD, hypertension, diabetes mellitus, depression, hyperlipidemia, multiple sclerosis   OT comments  Pt seen for OT/PT treatment on this date. Upon arrival to room pt resting in bed, agreeable to tx. Pt requires MAX A for bed mobility to decrease back pain. MIN A +2 sit<>stand with RW in place. Pt and spouse required education and demonstration of Rozell Searing Sit to Stand device. Device used to transfer pt from EOB to recliner without complication. Pt's spouse is interested in obtaining device to facilitate safe transfers at home. Pt's SpO2 dropped to 72% and maintained at 82% on 4L [place]. Oxygen increased to 6L [place] with SpO2 reaching 92% within 2 minutes. Nursing notified and agreed to monitor. Pt declined symptoms at the end of the session. Pt making progress toward goals, will continue to follow POC. Discharge recommendation remains appropriate.     Recommendations for follow up therapy are one component of a multi-disciplinary discharge planning process, led by the attending physician.  Recommendations may be updated based on patient status, additional functional criteria and insurance authorization.    Assistance Recommended at Discharge Frequent or constant Supervision/Assistance  Patient can return home with the following  Two people to help with walking and/or transfers;A lot of help with bathing/dressing/bathroom;Assistance with cooking/housework;Assist for transportation;Direct supervision/assist for financial management   Equipment Recommendations  None recommended by OT    Recommendations for Other Services      Precautions /  Restrictions Precautions Precautions: Fall Restrictions Weight Bearing Restrictions: No       Mobility Bed Mobility Overal bed mobility: Needs Assistance Bed Mobility: Supine to Sit     Supine to sit: Max assist, HOB elevated     General bed mobility comments: LE management and trunk control. MAX A to scoot EOB    Transfers Overall transfer level: Needs assistance Equipment used:  Rozell Searing STS) Transfers: Sit to/from Stand, Bed to chair/wheelchair/BSC Sit to Stand: Min assist, +2 safety/equipment           General transfer comment: Rozell Searing STS used to transfer pt to recliner per family request to see independence for home use.     Balance Overall balance assessment: Needs assistance Sitting-balance support: Feet supported Sitting balance-Leahy Scale: Fair     Standing balance support: Bilateral upper extremity supported, Reliant on assistive device for balance Standing balance-Leahy Scale: Poor Standing balance comment: pt remains high fall risk                           ADL either performed or assessed with clinical judgement   ADL Overall ADL's : Needs assistance/impaired                                       General ADL Comments: MIN A +2 with RW with EOB elevated. Pt had difficulty taking steps to transfer    Extremity/Trunk Assessment Upper Extremity Assessment Upper Extremity Assessment: Generalized weakness   Lower Extremity Assessment Lower Extremity Assessment: Generalized weakness        Vision       Perception     Praxis  Cognition Arousal/Alertness: Awake/alert Behavior During Therapy: WFL for tasks assessed/performed Overall Cognitive Status: Within Functional Limits for tasks assessed                                          Exercises      Shoulder Instructions       General Comments      Pertinent Vitals/ Pain       Pain Assessment Pain Assessment: Faces Faces Pain Scale:  Hurts little more Pain Location: bottocks, back Pain Descriptors / Indicators: Discomfort, Grimacing Pain Intervention(s): Monitored during session, Repositioned  Home Living                                          Prior Functioning/Environment              Frequency  Min 1X/week        Progress Toward Goals  OT Goals(current goals can now be found in the care plan section)  Progress towards OT goals: Progressing toward goals  Acute Rehab OT Goals Patient Stated Goal: To go home OT Goal Formulation: With patient/family Time For Goal Achievement: 05/23/23 Potential to Achieve Goals: Fair ADL Goals Pt Will Perform Grooming: with min assist;standing Pt Will Perform Lower Body Dressing: with min assist;sit to/from stand Pt Will Transfer to Toilet: with min assist;ambulating Pt Will Perform Toileting - Clothing Manipulation and hygiene: with min assist;sit to/from stand  Plan Discharge plan remains appropriate    Co-evaluation      Reason for Co-Treatment: Complexity of the patient's impairments (multi-system involvement)   OT goals addressed during session: ADL's and self-care;Strengthening/ROM      AM-PAC OT "6 Clicks" Daily Activity     Outcome Measure   Help from another person eating meals?: None Help from another person taking care of personal grooming?: A Little Help from another person toileting, which includes using toliet, bedpan, or urinal?: Total Help from another person bathing (including washing, rinsing, drying)?: A Lot Help from another person to put on and taking off regular upper body clothing?: A Lot Help from another person to put on and taking off regular lower body clothing?: Total 6 Click Score: 13    End of Session Equipment Utilized During Treatment: Oxygen;Rolling walker (2 wheels);Other (comment) Rozell Searing)  OT Visit Diagnosis: Unsteadiness on feet (R26.81);Repeated falls (R29.6);Muscle weakness (generalized)  (M62.81)   Activity Tolerance Patient limited by fatigue   Patient Left in chair;with call bell/phone within reach;with family/visitor present   Nurse Communication Mobility status;Other (comment) (O2 levels)        Time: 4098-1191 OT Time Calculation (min): 37 min  Charges: OT General Charges $OT Visit: 1 Visit OT Treatments $Self Care/Home Management : 8-22 mins Thresa Ross, OTS 05/16/2023, 12:52 PM

## 2023-05-16 NOTE — Care Management Important Message (Signed)
Important Message  Patient Details  Name: Brenyn Petrey MRN: [id number] Date of Birth: 14-Sep-1951   Medicare Important Message Given:  Yes     [name] 05/16/2023, 1:19 PM

## 2023-05-16 NOTE — Plan of Care (Signed)

## 2023-05-16 NOTE — Progress Notes (Signed)
Progress Note   Patient: Jeremy [name] [id number] DOB: [birth year] DOA: 05/06/2023     10 DOS: the patient was seen and examined on 05/16/2023   Brief hospital course: Mr. Budd Freiermuth is a 72 year old male with history of iron deficiency anemia presumed secondary to chronic blood loss in setting of possible venous malformation and Chronic gastritis, CKD stage II/IIIa, hypertension, insulin-dependent diabetes mellitus, depression, hyperlipidemia, who presents to the emergency department for chief concerns of weakness. Hemoglobin was 6.7, received blood transfusion.  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  Patient had increased short of breath in the morning of 7/14, gave dose of IV Lasix.  7/17.  Patient on 7 L nasal cannula.  Repeat chest x-ray showing likely pneumonia right lower lobe.  Antibiotics started.  Patient also placed on Lasix for lower extremity swelling and shortness of breath. 7/18.  Patient down to 5 L nasal cannula. 7/19.  Patient down to 4 L this morning but looks like up to 6 L this afternoon.  Will increase Lasix to 40 mg twice daily. 7/20.  Patient on 6 L nasal cannula this morning but I dialed down to 5.  Asked nursing staff to dial down to 4 L of pulse ox greater than 88%.  Today's hemoglobin 7.9.  Patient desaturates with minimal movement. 7/21.  Patient on high flow nasal cannula 5.5 L this morning.  Today's hemoglobin 8.0.  Placed on 40 mg IV Lasix twice daily starting in the afternoon. 7/22.  Patient was on a 5.5 L high flow nasal cannula patient was on a 5.5 L high flow nasal cannula overnight and on 4 L this morning.  Will give a unit of blood this afternoon for hemoglobin of 7.8.  Assessment and Plan: * Acute on chronic respiratory failure with hypoxia [facility]) Patient had worsening oxygenation on 7/14 with pulse ox of 84% on 3 L.  Patient on 5.5 high flow nasal cannula overnight and now down to 4 L nasal  cannula.  Improved with IV diuresis.  With repeat chest x-ray showing right lower lobe pneumonia I started antibiotics on 7/17 and completed course.  Acute on chronic diastolic CHF (congestive heart failure) (HCC) Continue aggressive IV diuresis with 40 mg IV twice daily of Lasix to see if we can get oxygenation better.  Right lower lobe pneumonia Rocephin and Zithromax started on 7/17.  Completed 5-day course.  Generalized weakness Physical therapy recommending rehab but patient may be interested in going home.  He needs to improve with PT and OT for this decision.  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  Continue to monitor hemoglobin.  Last hemoglobin 7.8.  Hematology ordered IV iron on 7/19.  Will transfuse 1 unit of packed red blood cells..  Hematology ordered for another dose of Epogen.  Stage 3a chronic kidney disease (HCC) Last creatinine 1.19 with a GFR of 60.  Continue Lasix to 40 mg IV twice daily  COPD (chronic obstructive pulmonary disease) (HCC) Continue nebulizers and inhalers  Multiple sclerosis (HCC) On Betaseron injection  Type 2 diabetes mellitus with diabetic polyneuropathy, with long-term current use of insulin (HCC) On sliding scale insulin and Lyrica.  Last hemoglobin A1c 1C actually low at 4.4 back in January.  Myocardial injury Secondary to blood loss anemia  Cirrhosis of liver without ascites (HCC) Continue to monitor hemoglobin  Anemia in chronic kidney disease (CKD) Last hemoglobin 7.8, last ferritin 160.  Will give a unit  of packed red blood cells on 7/22  Gastroesophageal reflux disease without esophagitis On Protonix  Depression On Zoloft  Hyperlipidemia On Zocor  Essential hypertension On Lasix        Subjective: Patient again does not complain of any shortness of breath.  Was on 5 L high flow nasal cannula overnight but switched to 4 L today regular nasal cannula.  Admitted with  anemia.  Physical Exam: Vitals:   05/16/23 0344 05/16/23 0637 05/16/23 0744 05/16/23 0824  BP: (!) 132/54   (!) 124/45  Pulse: 65   69  Resp: 18   16  Temp: 98.7 F (37.1 C)     TempSrc: Oral     SpO2: 91%  92% (!) 88%  Weight:  94.2 kg    Height:       Physical Exam HENT:     Head: Normocephalic.     Mouth/Throat:     Pharynx: No oropharyngeal exudate.  Eyes:     General: Lids are normal.     Conjunctiva/sclera: Conjunctivae normal.  Cardiovascular:     Rate and Rhythm: Normal rate and regular rhythm.     Heart sounds: Normal heart sounds, S1 normal and S2 normal.  Pulmonary:     Breath sounds: Examination of the right-lower field reveals decreased breath sounds. Examination of the left-lower field reveals decreased breath sounds. Decreased breath sounds present. No rhonchi or rales.  Abdominal:     Palpations: Abdomen is soft.     Tenderness: There is no abdominal tenderness.  Musculoskeletal:     Right lower leg: Swelling present.     Left lower leg: Swelling present.  Skin:    General: Skin is warm.     Comments: Chronic lower extremity skin discoloration  Neurological:     Mental Status: He is alert and oriented to person, place, and time.     Data Reviewed: Sodium 134, creatinine 1.19, hemoglobin 7.8  Family Communication: Spoke with wife at the bedside  Disposition: Status is: Inpatient Remains inpatient appropriate because: Still trying to get down to baseline oxygen.  Will give 1 unit of blood today.  Planned Discharge Destination: Home with Home Health    Time spent: 28 minutes  Author: Alford Highland, MD 05/16/2023 2:18 PM  For on call review [email or web address].

## 2023-05-17 DIAGNOSIS — R531 Weakness: Secondary | ICD-10-CM | POA: Diagnosis not present

## 2023-05-17 DIAGNOSIS — I5033 Acute on chronic diastolic (congestive) heart failure: Secondary | ICD-10-CM | POA: Diagnosis not present

## 2023-05-17 DIAGNOSIS — J9621 Acute and chronic respiratory failure with hypoxia: Secondary | ICD-10-CM | POA: Diagnosis not present

## 2023-05-17 DIAGNOSIS — J189 Pneumonia, unspecified organism: Secondary | ICD-10-CM | POA: Diagnosis not present

## 2023-05-17 LAB — TYPE AND SCREEN
ABO/RH(D): A POS
Unit division: 0

## 2023-05-17 LAB — BASIC METABOLIC PANEL WITH GFR
Anion gap: 5 (ref 5–15)
BUN: 32 mg/dL — ABNORMAL HIGH (ref 8–23)
CO2: 25 mmol/L (ref 22–32)
Calcium: 7.6 mg/dL — ABNORMAL LOW (ref 8.9–10.3)
Chloride: 103 mmol/L (ref 98–111)
Creatinine, Ser: 1.37 mg/dL — ABNORMAL HIGH (ref 0.61–1.24)
GFR, Estimated: 55 mL/min — ABNORMAL LOW
Glucose, Bld: 79 mg/dL (ref 70–99)
Potassium: 4.8 mmol/L (ref 3.5–5.1)
Sodium: 133 mmol/L — ABNORMAL LOW (ref 135–145)

## 2023-05-17 LAB — BPAM RBC
Blood Product Expiration Date: 202407292359
ISSUE DATE / TIME: 202407221446
Unit Type and Rh: 6200

## 2023-05-17 LAB — GLUCOSE, CAPILLARY
Glucose-Capillary: 77 mg/dL (ref 70–99)
Glucose-Capillary: 119 mg/dL — ABNORMAL HIGH (ref 70–99)
Glucose-Capillary: 144 mg/dL — ABNORMAL HIGH (ref 70–99)
Glucose-Capillary: 208 mg/dL — ABNORMAL HIGH (ref 70–99)

## 2023-05-17 LAB — HEMOGLOBIN: Hemoglobin: 8.6 g/dL — ABNORMAL LOW (ref 13.0–17.0)

## 2023-05-17 MED ORDER — ALBUMIN HUMAN 25 % IV SOLN
25.0000 g | Freq: Every day | INTRAVENOUS | Status: DC
  Administered 2023-05-18: 25 g via INTRAVENOUS
  Filled 2023-05-17 (×2): qty 100

## 2023-05-17 MED ORDER — EPOETIN ALFA 10000 UNIT/ML IJ SOLN
20000.0000 [IU] | INTRAMUSCULAR | Status: DC
  Administered 2023-05-17: 20000 [IU] via INTRAVENOUS

## 2023-05-17 NOTE — Progress Notes (Addendum)
Progress Note   Patient: Jeremy [name] [id number]:[id number] DOB: 1950/11/04 DOA: 05/06/2023     11 DOS: the patient was seen and examined on 05/17/2023   Brief hospital course: [name] is a 72 year old male with history of iron deficiency anemia presumed secondary to chronic blood loss in setting of possible venous malformation and Chronic gastritis, CKD stage II/IIIa, hypertension, insulin-dependent diabetes mellitus, depression, hyperlipidemia, who presents to the emergency department for chief concerns of weakness. Hemoglobin was 6.7, received blood transfusion.  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  Patient had increased short of breath in the morning of 7/14, gave dose of IV Lasix.  7/17.  Patient on 7 L nasal cannula.  Repeat chest x-ray showing likely pneumonia right lower lobe.  Antibiotics started.  Patient also placed on Lasix for lower extremity swelling and shortness of breath. 7/18.  Patient down to 5 L nasal cannula. 7/19.  Patient down to 4 L this morning but looks like up to 6 L this afternoon.  Will increase Lasix to 40 mg twice daily. 7/20.  Patient on 6 L nasal cannula this morning but I dialed down to 5.  Asked nursing staff to dial down to 4 L of pulse ox greater than 88%.  Today's hemoglobin 7.9.  Patient desaturates with minimal movement. 7/21.  Patient on high flow nasal cannula 5.5 L this morning.  Today's hemoglobin 8.0.  Placed on 40 mg IV Lasix twice daily starting in the afternoon. 7/22.  Patient was on a 5.5 L high flow nasal cannula patient was on a 5.5 L high flow nasal cannula overnight and on 4 L this morning.  Will give a unit of blood this afternoon for hemoglobin of 7.8. 7/23.  Patient with borderline saturations on 4 L nasal cannula.  Baseline 2 L.  Creatinine 1.37 and hemoglobin 8.6 after transfusion.  Desaturated down to 83% on 6 L with limited movement.  Assessment and Plan: * Acute on chronic  respiratory failure with hypoxia [facility]) Patient had worsening oxygenation on 7/14 with pulse ox of 84% on 3 L.  With repeat chest x-ray showing right lower lobe pneumonia I started antibiotics on 7/17 and completed course.  This morning patient on 4 L nasal cannula with borderline saturations.  Desaturates down to 83% with limited movement on 6 L.  Continue IV diuresis with Lasix twice daily.  Acute on chronic diastolic CHF (congestive heart failure) (HCC) Continue aggressive IV diuresis with 40 mg IV twice daily of Lasix to see if we can get oxygenation better.  Will add IV albumin.  Right lower lobe pneumonia Rocephin and Zithromax started on 7/17.  Completed 5-day course.  Generalized weakness Physical therapy recommending rehab but patient may be interested in going home.  He needs to improve with PT and OT for this decision.  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  Continue to monitor hemoglobin.  Hematology ordered IV iron on 7/19.  Hematology ordered for another dose of Epogen.  Transfuse 2 units of packed red blood cells during the hospital course (last blood transfusion on 7/22).  Today's hemoglobin 8.6.  Stage 3a chronic kidney disease (HCC) Last creatinine 1.37 with a GFR of 55.  Continue Lasix to 40 mg IV twice daily  COPD (chronic obstructive pulmonary disease) (HCC) Continue nebulizers and inhalers  Multiple sclerosis (HCC) On Betaseron injection  Type 2 diabetes mellitus with diabetic polyneuropathy, with long-term current use of insulin (  HCC) On sliding scale insulin and Lyrica.  Last hemoglobin A1c 1C actually low at 4.4 back in January.  Myocardial injury Secondary to blood loss anemia  Cirrhosis of liver without ascites (HCC) Continue to monitor hemoglobin  Anemia in chronic kidney disease (CKD) Last hemoglobin 8.6, last ferritin 160.  Received 2 units of packed red blood cells during hospital course.  Gastroesophageal reflux  disease without esophagitis On Protonix  Depression On Zoloft  Hyperlipidemia On Zocor  Essential hypertension On Lasix        Subjective: Patient does not complain of any shortness of breath or cough.  Feels okay.  Still very weak.  Desaturated with limited movement on 6 L.  TOC working on a lift to stand chair.  Came in initially with anemia.  Physical Exam: Vitals:   05/17/23 0537 05/17/23 0610 05/17/23 0719 05/17/23 0903  BP: (!) 132/47   (!) 127/49  Pulse: 68   71  Resp: 18   20  Temp: 98.8 F (37.1 C)   98.4 F (36.9 C)  TempSrc: Oral   Oral  SpO2: (!) 84% (!) 88% 90% (!) 89%  Weight:      Height:       Physical Exam HENT:     Head: Normocephalic.     Mouth/Throat:     Pharynx: No oropharyngeal exudate.  Eyes:     General: Lids are normal.     Conjunctiva/sclera: Conjunctivae normal.  Cardiovascular:     Rate and Rhythm: Normal rate and regular rhythm.     Heart sounds: Normal heart sounds, S1 normal and S2 normal.  Pulmonary:     Breath sounds: Examination of the right-lower field reveals decreased breath sounds. Examination of the left-lower field reveals decreased breath sounds. Decreased breath sounds present. No rhonchi or rales.  Abdominal:     Palpations: Abdomen is soft.     Tenderness: There is no abdominal tenderness.  Musculoskeletal:     Right lower leg: Swelling present.     Left lower leg: Swelling present.  Skin:    General: Skin is warm.     Comments: Chronic lower extremity skin discoloration  Neurological:     Mental Status: He is alert and oriented to person, place, and time.     Data Reviewed: Weight down to 206.  Was 196 when he came in. Sodium 133, creatinine 1.37, GFR 55, hemoglobin 8.6  Family Communication: Spoke with wife at the bedside  Disposition: Status is: Inpatient Remains inpatient appropriate because: Continue diuresis with IV Lasix.  Patient desaturated down to 83% on 6 L with limited movement.  Borderline  saturations at rest on 4 L.  Planned Discharge Destination: Home    Time spent: 28 minutes  Author: Alford Highland, MD 05/17/2023 2:25 PM  For on call review [email or web address].

## 2023-05-18 DIAGNOSIS — J9621 Acute and chronic respiratory failure with hypoxia: Secondary | ICD-10-CM | POA: Diagnosis not present

## 2023-05-18 LAB — GLUCOSE, CAPILLARY
Glucose-Capillary: 144 mg/dL — ABNORMAL HIGH (ref 70–99)
Glucose-Capillary: 144 mg/dL — ABNORMAL HIGH (ref 70–99)
Glucose-Capillary: 181 mg/dL — ABNORMAL HIGH (ref 70–99)
Glucose-Capillary: 147 mg/dL — ABNORMAL HIGH (ref 70–99)

## 2023-05-18 NOTE — Progress Notes (Signed)
Occupational Therapy Treatment Patient Details Name: Jeremy [name] MRN: [id number] DOB: 11-23-1950 Today's Date: 05/18/2023   History of present illness Patient is a 71 year old male presenting with concerns for weakness. Found to have acute on chronic hypoxemic respiratory failure and diastolic congestive heart failure. History of iron deficiency anemia, COPD, CKD, hypertension, diabetes mellitus, depression, hyperlipidemia, multiple sclerosis   OT comments  Pt seen for OT treatment on this date. Upon arrival to room pt seated in recliner, agreeable to tx. Pt requires MOD A +2 for sit<>stand with RW in place. Pt reported need to urinate, but once standing voiced concern for BM. Pt transferred to [facility] with MOD A +2 with RW. VC for sequencing and tactile cueing for RW management. Pt's O2 increased to 6L [place] during exertion per nurses recommendation. SpO2 dropped to 82% and recovered to 92% after 3-4 minutes of rest. Pt returned demonstrated HEP with minor prompts to complete with correct/safe form. Pt making progress toward goals, will continue to follow POC. Discharge recommendation remains appropriate.     Recommendations for follow up therapy are one component of a multi-disciplinary discharge planning process, led by the attending physician.  Recommendations may be updated based on patient status, additional functional criteria and insurance authorization.    Assistance Recommended at Discharge Frequent or constant Supervision/Assistance  Patient can return home with the following  Two people to help with walking and/or transfers;A lot of help with bathing/dressing/bathroom;Assistance with cooking/housework;Assist for transportation;Direct supervision/assist for financial management   Equipment Recommendations  None recommended by OT    Recommendations for Other Services      Precautions / Restrictions Precautions Precautions: Fall Restrictions Weight Bearing Restrictions: No        Mobility Bed Mobility                    Transfers Overall transfer level: Needs assistance Equipment used: Rolling walker (2 wheels) Transfers: Sit to/from Stand, Bed to chair/wheelchair/BSC Sit to Stand: Mod assist, +2 physical assistance Stand pivot transfers: Mod assist, +2 physical assistance         General transfer comment: Transfered to [facility] with RW and MOD A +2 - tactile cueing for RW management and weight shifting while stepping     Balance Overall balance assessment: Needs assistance Sitting-balance support: Feet supported Sitting balance-Leahy Scale: Fair Sitting balance - Comments: Sat on BSC with arm rails used for stability   Standing balance support: Bilateral upper extremity supported, Reliant on assistive device for balance Standing balance-Leahy Scale: Poor Standing balance comment: pt remains high fall risk                           ADL either performed or assessed with clinical judgement   ADL Overall ADL's : Needs assistance/impaired                         Toilet Transfer: Moderate assistance;+2 for physical assistance;Stand-pivot;BSC/3in1   Toileting- Clothing Manipulation and Hygiene: Maximal assistance;Sit to/from stand       Functional mobility during ADLs: Moderate assistance;+2 for physical assistance;Rolling walker (2 wheels) General ADL Comments: MOD-MAX for pericare and related hygiene of LEs    Extremity/Trunk Assessment Upper Extremity Assessment Upper Extremity Assessment: Generalized weakness   Lower Extremity Assessment Lower Extremity Assessment: Generalized weakness        Vision       Perception     Praxis  Cognition Arousal/Alertness: Awake/alert Behavior During Therapy: WFL for tasks assessed/performed Overall Cognitive Status: Within Functional Limits for tasks assessed                                 General Comments: Slow processing        Exercises General  Exercises - Upper Extremity Shoulder ABduction: AROM, Strengthening, Both, 10 reps, Supine, Theraband Theraband Level (Shoulder Abduction): Level 1 (Yellow)    Shoulder Instructions       General Comments      Pertinent Vitals/ Pain       Pain Assessment Pain Assessment: Faces Faces Pain Scale: Hurts a little bit Pain Location: bottocks, back Pain Descriptors / Indicators: Discomfort, Grimacing Pain Intervention(s): Monitored during session  Home Living                                          Prior Functioning/Environment              Frequency  Min 1X/week        Progress Toward Goals  OT Goals(current goals can now be found in the care plan section)  Progress towards OT goals: Progressing toward goals  Acute Rehab OT Goals Patient Stated Goal: To get stronger and return home OT Goal Formulation: With patient/family Time For Goal Achievement: 05/23/23 Potential to Achieve Goals: Fair ADL Goals Pt Will Perform Grooming: with min assist;standing Pt Will Perform Lower Body Dressing: with min assist;sit to/from stand Pt Will Transfer to Toilet: with min assist;ambulating Pt Will Perform Toileting - Clothing Manipulation and hygiene: with min assist;sit to/from stand  Plan Discharge plan remains appropriate    Co-evaluation                 AM-PAC OT "6 Clicks" Daily Activity     Outcome Measure   Help from another person eating meals?: None Help from another person taking care of personal grooming?: A Little Help from another person toileting, which includes using toliet, bedpan, or urinal?: Total Help from another person bathing (including washing, rinsing, drying)?: A Lot Help from another person to put on and taking off regular upper body clothing?: A Lot Help from another person to put on and taking off regular lower body clothing?: Total 6 Click Score: 13    End of Session Equipment Utilized During Treatment: Oxygen;Rolling  walker (2 wheels)  OT Visit Diagnosis: Unsteadiness on feet (R26.81);Repeated falls (R29.6);Muscle weakness (generalized) (M62.81)   Activity Tolerance Patient limited by fatigue   Patient Left Other (comment);with call bell/phone within reach;with family/visitor present [facility])   Nurse Communication Mobility status;Other (comment) (Pt on BSC with family in room)        Time: 1100-1130 OT Time Calculation (min): 30 min  Charges: OT General Charges $OT Visit: 1 Visit OT Treatments $Self Care/Home Management : 23-37 mins  Thresa Ross, OTS 05/18/2023, 12:56 PM

## 2023-05-18 NOTE — Plan of Care (Signed)

## 2023-05-18 NOTE — Progress Notes (Signed)
[facility]  -  at [facility]   PATIENT NAME: Jeremy [name]    MR#:  [id number]  DATE OF BIRTH:  31-Jan-1951  SUBJECTIVE:  life at bedside. Patient sitting up in the recliner chair. Wife said the chairlift will be delivered tomorrow. Overall patient's breathing is improving. Using about 3 to 4 L of nasal cannula oxygen. Patient is anxious and wants to go home tomorrow. Tolerating PO diet. Denies any chest pain.    VITALS:  Blood pressure (!) 144/61, pulse 93, temperature 98.9 F (37.2 C), temperature source Oral, resp. rate 16, height 5\' 10"  (1.778 m), weight 93.6 kg, SpO2 90%.  PHYSICAL EXAMINATION:   GENERAL:  72 y.o.-year-old patient with no acute distress. Appears chronically ill.   LUNGS:decreased breath sounds bilaterally, no wheezing CARDIOVASCULAR: S1, S2 normal. No murmur   ABDOMEN: Soft, nontender, nondistended. Bowel sounds present.  EXTREMITIES: chronic bilateral edema b/l.    NEUROLOGIC: nonfocal  patient is alert and awake SKIN: per RN  LABORATORY PANEL:  CBC Recent Labs  Lab 05/15/23 0544 05/16/23 0745 05/17/23 0302  WBC 4.8  --   --   HGB 8.0*   < > 8.6*  HCT 25.9*  --   --   PLT 227  --   --    < > = values in this interval not displayed.    Chemistries  Recent Labs  Lab 05/15/23 0544 05/16/23 0745 05/17/23 0302  NA 132*   < > 133*  K 4.8   < > 4.8  CL 104   < > 103  CO2 24   < > 25  GLUCOSE 80   < > 79  BUN 32*   < > 32*  CREATININE 1.37*   < > 1.37*  CALCIUM 7.7*   < > 7.6*  AST 33  --   --   ALT 18  --   --   ALKPHOS 108  --   --   BILITOT 0.6  --   --    < > = values in this interval not displayed.   Assessment and Plan  Jeremy [name] is a 72 year old male with history of iron deficiency anemia presumed secondary to chronic blood loss in setting of possible venous malformation and Chronic gastritis, CKD stage II/IIIa, hypertension, insulin-dependent diabetes mellitus, depression, hyperlipidemia, who presents to  the emergency department for chief concerns of weakness. Hemoglobin was 6.7, received blood transfusion.  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  Patient had increased short of breath in the morning of 7/14, gave dose of IV Lasix.   Acute on chronic respiratory failure with hypoxia [facility]) --Patient had worsening oxygenation on 7/14 with pulse ox of 84% on 3 L.   --With repeat chest x-ray showing right lower lobe pneumonia started antibiotics on 7/17 and completed course.   --This morning patient on 4 L nasal cannula with borderline saturations.  Desaturates down to 83% with limited movement on 6 L.  Continue IV diuresis with Lasix twice daily--good UOP --today feels better. Desires to go home hoping tomorrow   Acute on chronic diastolic CHF (congestive heart failure) (HCC) --Continue aggressive IV diuresis with 40 mg IV twice daily of Lasix to see if we can get oxygenation better.  --Will add IV albumin.   Right lower lobe pneumonia --Rocephin and Zithromax started on 7/17.  Completed 5-day course.   Generalized weakness ---Physical therapy recommending rehab but patient may be interested in  going home.  He needs to improve with PT and OT for this decision.   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   --  Hematology ordered IV iron on 7/19.  Hematology ordered for another dose of Epogen.   --Transfused  2 units of packed red blood cells during the hospital course (last blood transfusion on 7/22).  --Now hemoglobin 8.9   Stage 3a chronic kidney disease (HCC) Last creatinine 1.37 with a GFR of 55.  Continue Lasix to 40 mg IV twice daily   COPD (chronic obstructive pulmonary disease) (HCC) Continue nebulizers and inhalers   Multiple sclerosis (HCC) On Betaseron injection   Type 2 diabetes mellitus with diabetic polyneuropathy, with long-term current use of insulin (HCC) On sliding scale  insulin and Lyrica.  Last hemoglobin A1c 1C actually low at 4.4 back in January.   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  Received 2 units of packed red blood cells during hospital course.   Gastroesophageal reflux disease without esophagitis On Protonix   Depression On Zoloft   Hyperlipidemia On Zocor   Essential hypertension On Lasix     Family communication :wife at bedside Consults : oncology CODE STATUS: full DVT Prophylaxis : SCD Level of care: Med-Surg Status is: Inpatient Remains inpatient appropriate because: continue IV diuresis today. If remains stable consider discharge tomorrow. Patient and wife agreeable    TOTAL TIME TAKING CARE OF THIS PATIENT: 35 minutes.  >50% time spent on counselling and coordination of care  Note: This dictation was prepared with Dragon dictation along with smaller phrase technology. Any transcriptional errors that result from this process are unintentional.  Enedina Finner M.D    [facility]   CC: Primary care physician; Duanne Limerick, MD

## 2023-05-18 NOTE — Progress Notes (Signed)
PT Cancellation Note  Patient Details Name: Jeremy [name] MRN: [id number] DOB: 1951/06/04   Cancelled Treatment:     Therapist in to see pt who was sitting up in chair of 4L O2 at rest, SpO2 86%. Discussed plans with pt and wife regarding d/c home tomorrow. Equipment being delivered tomorrow. Pt's wife states she is very familiar with lift equipment and does not feel the need for further training. Will continue PT per POC acutely if d/c is delayed.   Jannet Askew 05/18/2023, 2:44 PM

## 2023-05-18 NOTE — TOC Progression Note (Signed)
Transition of Care [facility]) - Progression Note    Patient Details  Name: Jeremy [name] MRN: [id number] Date of Birth: 1951/06/05  Transition of Care [facility]) CM/SW Contact  Garret Reddish, RN Phone Number: 05/18/2023, 1:33 PM  Clinical Narrative:   Chart reviewed.  I have faxed application for [employer] to assist with appointments on discharge. I have spoken with Jonny Ruiz from [employer] and he reports that they will review application for alibility and follow back up with patient and his wife.  John reports that their process takes 21 days to completed.  He reports that do not expedite the process.  He reports that they may process application before 21 days but their process is 21 days to process the application.  I have made referral for sit to stand lift with [employer].  I have made referral to John with [employer].    Patient continues treatment for Acute on chronic respiratory failure with hypoxia and Acute on chronic diastolic CHF (congestive heart failure.  Patient continues to diuresis with IV Lasix   TOC will continue to follow for discharge planning.      Expected Discharge Plan: Home w Home Health Services Barriers to Discharge: No Barriers Identified  Expected Discharge Plan and Services   Discharge Planning Services: CM Consult Post Acute Care Choice: Home Health Living arrangements for the past 2 months: Single Family Home                           HH Arranged:  (Patient is active with [facility] for home health PT)       Representative spoke with at [facility] Agency: Barbara Cower   Social Determinants of Health (SDOH) Interventions SDOH Screenings   Food Insecurity: No Food Insecurity (05/07/2023)  Housing: Low Risk  (05/07/2023)  Transportation Needs: No Transportation Needs (05/07/2023)  Utilities: Not At Risk (05/07/2023)  Alcohol Screen: Low Risk  (11/16/2021)  Depression (PHQ2-9): Low Risk  (12/31/2022)  Financial Resource Strain: Low Risk  (11/19/2022)  Physical Activity:  Insufficiently Active (11/19/2022)  Social Connections: Moderately Isolated (11/19/2022)  Stress: No Stress Concern Present (11/19/2022)  Tobacco Use: Medium Risk (05/06/2023)    Readmission Risk Interventions    11/25/2022   10:25 AM 02/04/2022    9:47 AM  Readmission Risk Prevention Plan  Transportation Screening Complete Complete  PCP or Specialist Appt within 3-5 Days Complete Complete  HRI or Home Care Consult Complete   Social Work Consult for Recovery Care Planning/Counseling Complete Complete  Palliative Care Screening Not Applicable Not Applicable  Medication Review [employer]) Complete Complete

## 2023-05-19 DIAGNOSIS — J9621 Acute and chronic respiratory failure with hypoxia: Secondary | ICD-10-CM | POA: Diagnosis not present

## 2023-05-19 LAB — URINALYSIS, W/ REFLEX TO CULTURE (INFECTION SUSPECTED)
Bilirubin Urine: NEGATIVE
Glucose, UA: NEGATIVE mg/dL
Ketones, ur: NEGATIVE mg/dL
Leukocytes,Ua: NEGATIVE
Nitrite: NEGATIVE
Protein, ur: NEGATIVE mg/dL
RBC / HPF: >50 RBC/hpf (ref 0–5)
Specific Gravity, Urine: 1.008 (ref 1.005–1.030)
Squamous Epithelial / HPF: NONE SEEN /HPF (ref 0–5)
pH: 5 (ref 5.0–8.0)

## 2023-05-19 LAB — BASIC METABOLIC PANEL
BUN: 33 mg/dL — ABNORMAL HIGH (ref 8–23)
Calcium: 7.9 mg/dL — ABNORMAL LOW (ref 8.9–10.3)
Chloride: 104 mmol/L (ref 98–111)
Creatinine, Ser: 1.46 mg/dL — ABNORMAL HIGH (ref 0.61–1.24)
GFR, Estimated: 51 mL/min — ABNORMAL LOW (ref >60)
Potassium: 4.5 mmol/L (ref 3.5–5.1)
Sodium: 135 mmol/L (ref 135–145)

## 2023-05-19 LAB — GLUCOSE, CAPILLARY
Glucose-Capillary: 75 mg/dL (ref 70–99)
Glucose-Capillary: 161 mg/dL — ABNORMAL HIGH (ref 70–99)
Glucose-Capillary: 161 mg/dL — ABNORMAL HIGH (ref 70–99)
Glucose-Capillary: 138 mg/dL — ABNORMAL HIGH (ref 70–99)
Glucose-Capillary: 173 mg/dL — ABNORMAL HIGH (ref 70–99)

## 2023-05-19 MED ORDER — NYSTATIN 100000 UNIT/GM EX POWD
Freq: Three times a day (TID) | CUTANEOUS | Status: DC
  Filled 2023-05-19: qty 15

## 2023-05-19 MED ORDER — FUROSEMIDE 40 MG PO TABS
40.0000 mg | ORAL_TABLET | Freq: Every day | ORAL | Status: DC
  Administered 2023-05-20: 40 mg via ORAL
  Filled 2023-05-19: qty 1

## 2023-05-19 MED ORDER — TAMSULOSIN HCL 0.4 MG PO CAPS
0.4000 mg | ORAL_CAPSULE | Freq: Every day | ORAL | Status: DC
  Administered 2023-05-19: 0.4 mg via ORAL
  Filled 2023-05-19: qty 1

## 2023-05-19 NOTE — Plan of Care (Signed)

## 2023-05-19 NOTE — Care Management Important Message (Signed)
Important Message  Patient Details  Name: Jeremy [name] MRN: [id number] Date of Birth: 03/24/1951   Medicare Important Message Given:  Yes     Olegario Messier A Dehaven Sine 05/19/2023, 10:59 AM

## 2023-05-19 NOTE — Progress Notes (Addendum)
[facility]  - [place] at [facility]   PATIENT NAME: Jeremy [name]    MR#:  [id number]  DATE OF BIRTH:  [birth year]  SUBJECTIVE:  wife at bedside. Patient sitting up in the recliner chair.  Pt retained urine last nite. 975 cc  after in and out Bladder scan today 1000 cc--unable to urinate. In and out 1000 cc  Trial of Flomax.      VITALS:  Blood pressure (!) 123/56, pulse 71, temperature 98.3 F (36.8 C), temperature source Oral, resp. rate 18, height 5\' 10"  (1.778 m), weight 91.9 kg, SpO2 96%.  PHYSICAL EXAMINATION:   GENERAL:  72 y.o.-year-old patient with no acute distress. Appears chronically ill.   LUNGS:decreased breath sounds bilaterally, no wheezing CARDIOVASCULAR: S1, S2 normal. No murmur   ABDOMEN: Soft, nontender, nondistended. Bowel sounds present.  EXTREMITIES: chronic bilateral edema b/l.    NEUROLOGIC: nonfocal  patient is alert and awake SKIN: per RN  LABORATORY PANEL:  CBC Recent Labs  Lab 05/15/23 0544 05/16/23 0745 05/17/23 0302  WBC 4.8  --   --   HGB 8.0*   < > 8.6*  HCT 25.9*  --   --   PLT 227  --   --    < > = values in this interval not displayed.    Chemistries  Recent Labs  Lab 05/15/23 0544 05/16/23 0745 05/19/23 0324  NA 132*   < > 135  K 4.8   < > 4.5  CL 104   < > 104  CO2 24   < > 26  GLUCOSE 80   < > 89  BUN 32*   < > 33*  CREATININE 1.37*   < > 1.46*  CALCIUM 7.7*   < > 7.9*  AST 33  --   --   ALT 18  --   --   ALKPHOS 108  --   --   BILITOT 0.6  --   --    < > = values in this interval not displayed.   Assessment and Plan  Jeremy [name] is a 72 year old male with history of iron deficiency anemia presumed secondary to chronic blood loss in setting of possible venous malformation and Chronic gastritis, CKD stage II/IIIa, hypertension, insulin-dependent diabetes mellitus, depression, hyperlipidemia, who presents to the emergency department for chief concerns of weakness. Hemoglobin was 6.7, received  blood transfusion.  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  Patient had increased short of breath in the morning of 7/14, gave dose of IV Lasix.   Acute on chronic respiratory failure with hypoxia [facility]) --Patient had worsening oxygenation on 7/14 with pulse ox of 84% on 3 L.   --With repeat chest x-ray showing right lower lobe pneumonia started antibiotics on 7/17 and completed course.   --This morning patient on 4 L nasal cannula with borderline saturations.  Desaturates down to 83% with limited movement on 6 L.  Continue IV diuresis with Lasix twice daily--good UOP --today feels better. Desires to go home hoping tomorrow --overall improving slowly. D/c IV lasix and albumin. Change to po lasix  Acute urinary Retention --in and out x 2 --flomax trial --send UC. Pt denies dysuria  --no h/o BPH per wife or issues with retention in the past   Acute on chronic diastolic CHF (congestive heart failure) (HCC) --Continue aggressive IV diuresis with 40 mg IV twice daily of Lasix to see if we can get oxygenation better.  --Will  add IV albumin.   Right lower lobe pneumonia --Rocephin and Zithromax started on 7/17.  Completed 5-day course.   Generalized weakness ---Physical therapy recommending rehab but patient may be interested in going home.  He needs to improve with PT and OT for this decision.   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   --  Hematology ordered IV iron on 7/19.  Hematology ordered for another dose of Epogen.   --Transfused  2 units of packed red blood cells during the hospital course (last blood transfusion on 7/22).  --Now hemoglobin 8.9   Stage 3a chronic kidney disease (HCC) Last creatinine 1.37 with a GFR of 55.  Continue Lasix to 40 mg IV twice daily   COPD (chronic obstructive pulmonary disease) (HCC) Continue nebulizers and inhalers   Multiple sclerosis (HCC) On  Betaseron injection   Type 2 diabetes mellitus with diabetic polyneuropathy, with long-term current use of insulin (HCC) On sliding scale insulin and Lyrica.  Last hemoglobin A1c 1C actually low at 4.4 back in January.   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  Received 2 units of packed red blood cells during hospital course.   Gastroesophageal reflux disease without esophagitis On Protonix   Depression On Zoloft   Hyperlipidemia On Zocor   Essential hypertension On Lasix     Family communication :wife at bedside Consults : oncology CODE STATUS: full DVT Prophylaxis : SCD Level of care: Med-Surg Status is: Inpatient Remains inpatient appropriate because: continue po diuresis today. Monitor for UOP  If remains stable consider discharge tomorrow i. Patient and wife agreeable    TOTAL TIME TAKING CARE OF THIS PATIENT: 35 minutes.  >50% time spent on counselling and coordination of care  Note: This dictation was prepared with Dragon dictation along with smaller phrase technology. Any transcriptional errors that result from this process are unintentional.  Enedina Finner M.D    [facility]   CC: Primary care physician; Duanne Limerick, MD

## 2023-05-20 DIAGNOSIS — J9621 Acute and chronic respiratory failure with hypoxia: Secondary | ICD-10-CM | POA: Diagnosis not present

## 2023-05-20 LAB — GLUCOSE, CAPILLARY
Glucose-Capillary: 106 mg/dL — ABNORMAL HIGH (ref 70–99)
Glucose-Capillary: 200 mg/dL — ABNORMAL HIGH (ref 70–99)

## 2023-05-20 MED ORDER — FUROSEMIDE 40 MG PO TABS: 40.0000 mg | ORAL_TABLET | ORAL | 0 refills | Status: DC

## 2023-05-20 MED ORDER — TAMSULOSIN HCL 0.4 MG PO CAPS: 0.4000 mg | ORAL_CAPSULE | Freq: Every day | ORAL | 1 refills | Status: DC

## 2023-05-20 MED ORDER — INSULIN GLARGINE 100 UNIT/ML SOLOSTAR PEN: 10.0000 [IU] | PEN_INJECTOR | Freq: Every day | SUBCUTANEOUS | Status: DC

## 2023-05-20 MED ORDER — IPRATROPIUM-ALBUTEROL 0.5-2.5 (3) MG/3ML IN SOLN: 3.0000 mL | Freq: Two times a day (BID) | RESPIRATORY_TRACT | Status: DC

## 2023-05-20 MED ORDER — OMEPRAZOLE 40 MG PO CPDR
40.0000 mg | DELAYED_RELEASE_CAPSULE | Freq: Every day | ORAL | 0 refills | Status: DC
Start: 2023-05-20 — End: 2023-07-11

## 2023-05-20 MED ORDER — NYSTATIN 100000 UNIT/GM EX POWD: Freq: Three times a day (TID) | CUTANEOUS | 0 refills | Status: DC

## 2023-05-20 MED ORDER — LIDOCAINE 5 % EX PTCH: 1.0000 | MEDICATED_PATCH | CUTANEOUS | 0 refills | Status: DC

## 2023-05-20 NOTE — Consult Note (Signed)
[facility] Customer service manager [facility]) Accountable Care Organization (ACO) [facility] Liaison Note  05/20/2023  Jeremy [name] 1951/07/26 [id number]  Location: [facility] RN Hospital Liaison screened the patient remotely at [facility]  Insurance:  Medicare   Jeremy [name] is a 72 y.o. male who is a Primary Care Patient of Duanne Limerick, MD. The patient was screened for readmission hospitalization with noted high risk score for unplanned readmission risk with 2 IP/1 ED in 6 months.  The patient was assessed for potential [facility]) Care Management service needs for post hospital transition for care coordination. Review of patient's electronic medical record reveals patient was admitted with Weakness (Acute Chronic respiratory failure w/ hypoxia). Liaison spoke with pt's spouse Deatra Ina who was at the bedside with pt. Introduced purpose for today's call and verified PCP. Inquired it pt's needs were being met due to pt's followed closely by the oncologist team and spouse indicated the pt's care was followed closely by the team. States pt is already pending an appointment with his oncologist. No needs presented at this time for a care coordinator. Pt will discharged with [facility] services and pt will continue to be followed by the oncologist team.  Plan: [facility] Liaison will continue to follow progress and disposition to asess for post hospital community care coordination/management needs.  Referral request for community care coordination: pending disposition.   [facility] Care Management/Population Health does not replace or interfere with any arrangements made by the Inpatient Transition of Care team.   For questions contact:   Elliot Cousin, RN, [facility] Liaison [place]   Population Health Office Hours MTWF  8:00 am-6:00 pm Off on Thursday [phone] mobile [phone] [Office toll free line] Office Hours are M-F 8:30 - 5 pm 24 hour nurse advise line  [phone] Concierge  [email or web address]

## 2023-05-20 NOTE — Discharge Instructions (Signed)
Foley care per protocol ?

## 2023-05-20 NOTE — TOC Transition Note (Signed)
Transition of Care [facility]) - CM/SW Discharge Note   Patient Details  Name: Jeremy [name] MRN: [id number] Date of Birth: Jun 01, 1951  Transition of Care [facility]) CM/SW Contact:  Garret Reddish, RN Phone Number: 05/20/2023, 12:29 PM   Clinical Narrative:   Chart reviewed.  Noted that patient has orders for discharge today.    I have spoken with Mrs. Poynter.  She informs me that she has received the sit to stand lift at her home.  I have informed Mrs. Mcgeehan that I have followed up with [employer] about transportation application.  I have informed Mrs. Nedved the application process takes up to 21 days and they will contact her when they have processed the information.  I did ask to see if they would expedite the process due to patient being a hospital discharge and they informed me that their process is 21 days and if they may can process before 21 days but they can not make any promises.  I have informed Mrs. Kirwan of the above information.   I have informed Barbara Cower with [facility] that patient will be a discharge for today.  [facility] will provide home health services on discharge.    I have arranged [facility] to transport patient home today.    I have made patient, Mrs. Luecht and staff nurse aware.        Final next level of care: Home w Home Health Services Barriers to Discharge: No Barriers Identified   Patient Goals and CMS Choice CMS Medicare.gov Compare Post Acute Care list provided to:: Patient Choice offered to / list presented to : Patient  Discharge Placement                    Name of family member notified: Mrs. Hofmann Patient and family notified of of transfer: 05/20/23  Discharge Plan and Services Additional resources added to the After Visit Summary for     Discharge Planning Services: CM Consult Post Acute Care Choice: Home Health          DME Arranged:  (Sit to stand lift) DME Agency: [employer]     Representative spoke with at DME Agency: Esperanza Richters  Arranged:  (Patient is active with [facility] for home health PT) HH Agency: [facility] ([facility]) Date HH Agency Contacted: 05/20/23 Time HH Agency Contacted: 1200 Representative spoke with at [facility] Agency: Barbara Cower  Social Determinants of Health (SDOH) Interventions SDOH Screenings   Food Insecurity: No Food Insecurity (05/07/2023)  Housing: Low Risk  (05/07/2023)  Transportation Needs: No Transportation Needs (05/07/2023)  Utilities: Not At Risk (05/07/2023)  Alcohol Screen: Low Risk  (11/16/2021)  Depression (PHQ2-9): Low Risk  (12/31/2022)  Financial Resource Strain: Low Risk  (11/19/2022)  Physical Activity: Insufficiently Active (11/19/2022)  Social Connections: Moderately Isolated (11/19/2022)  Stress: No Stress Concern Present (11/19/2022)  Tobacco Use: Medium Risk (05/06/2023)     Readmission Risk Interventions    11/25/2022   10:25 AM 02/04/2022    9:47 AM  Readmission Risk Prevention Plan  Transportation Screening Complete Complete  PCP or Specialist Appt within 3-5 Days Complete Complete  HRI or Home Care Consult Complete   Social Work Consult for Recovery Care Planning/Counseling Complete Complete  Palliative Care Screening Not Applicable Not Applicable  Medication Review Oceanographer) Complete Complete

## 2023-05-20 NOTE — Discharge Summary (Signed)
Physician Discharge Summary   Patient: Jeremy [name] MRN: [id number] DOB: 17-Jul-1951  Admit date:     05/06/2023  Discharge date: 05/20/23  Discharge Physician: Enedina Finner   PCP: Duanne Limerick, MD   Recommendations at discharge:    F/u Urology on Aug 8th at 8:15 am with dr Lonna Cobb F/u PCP in 1-2 weeks  Discharge Diagnoses: Principal Problem:   Acute on chronic respiratory failure with hypoxia [facility]) Active Problems:   Acute on chronic diastolic CHF (congestive heart failure) (HCC)   Right lower lobe pneumonia   Chronic blood loss anemia   Generalized weakness   Stage 3a chronic kidney disease (HCC)   Arteriovenous malformation (AVM)   COPD (chronic obstructive pulmonary disease) (HCC)   Multiple sclerosis (HCC)   Type 2 diabetes mellitus with diabetic polyneuropathy, with long-term current use of insulin (HCC)   Essential hypertension   Hyperlipidemia   Depression   Gastroesophageal reflux disease without esophagitis   Primary pulmonary hypertension (HCC)   Anemia in chronic kidney disease (CKD)   Symptomatic anemia   Cirrhosis of 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 Hemoglobin was 6.7, received blood transfusion.  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  Patient had increased short of breath in the morning of 7/14, gave dose of IV Lasix.    Acute on chronic respiratory failure with hypoxia [facility]) --Patient had worsening oxygenation on 7/14 with pulse ox of 84% on 3 L.   --With repeat chest x-ray showing right lower lobe  pneumonia started antibiotics on 7/17 and completed course.   --This morning patient on 4 L nasal cannula with borderline saturations.  Desaturates down to 83% with limited movement on 6 L.  Continue IV diuresis with Lasix twice daily--good UOP --overall improving slowly. D/c IV lasix and albumin. Change to po lasix 40 mg qod   Acute urinary Retention --in and out x 2 --flomax trial --send UC. Pt denies dysuria  --no h/o BPH per wife or issues with retention in the past --7/26-- patient continues to retain urine. Scrotal and penile edema decreased significantly. Bladder scan this morning also showed 500 earlier and 675 mL later. Discussed with urology PA Sam along with patient and patient's wife. But decided to place a Foley catheter in continue Flomax and follow-up Dr. Lonna Cobb on August 8 for evaluation and possible voiding trial.   Acute on chronic diastolic CHF (congestive heart failure) (HCC) -- patient got aggressive IV Lasix and albumin. Good urine output. Change to Lasix 40 mg every other day due to CKD   right lower lobe pneumonia --Rocephin and Zithromax started on 7/17.  Completed 5-day course.   Generalized weakness ---Physical therapy recommending rehab but patient and wife interested in going home.   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   --  Hematology ordered IV iron on 7/19.  Hematology ordered for another dose of Epogen.   --Transfused  2 units of packed red blood cells during the hospital course (last blood transfusion on 7/22).  --Now hemoglobin 8.9   Stage 3a chronic kidney disease (HCC) Last creatinine 1.46 with a GFR of  55.    COPD (chronic obstructive pulmonary disease) (HCC) Continue nebulizers and inhalers   Multiple sclerosis (HCC) On Betaseron injection   Type 2 diabetes mellitus with diabetic polyneuropathy, with long-term current use of insulin (HCC) On sliding scale insulin and Lyrica.  Last hemoglobin A1c 1C  actually low at 4.4 back in January. D/ced metformin and decreased inulin to 10 units qd   Myocardial injury Secondary to blood loss anemia   Cirrhosis of liver without ascites (HCC) Continue to monitor hemoglobin   Anemia in chronic kidney disease (CKD) Last hemoglobin 8.6, last ferritin 160.  Received 2 units of packed red blood cells during hospital course.   Gastroesophageal reflux disease without esophagitis On Protonix   Depression On Zoloft   Hyperlipidemia On Zocor   Essential hypertension On Lasix   discharge plan was discussed with patient's wife at length at bedside. She is in agreement with plan. Will discharge to home with home health. TOC to help arrange transportation.  Patient has multiple medical problems. He is at high risk for readmission.   Family communication :wife at bedside Consults : oncology CODE STATUS: full DVT Prophylaxis : SCD Level of care: Med-Surg      Pain control - [place] Controlled Substance Reporting System database was reviewed. and patient was instructed, not to drive, operate heavy machinery, perform activities at heights, swimming or participation in water activities or provide baby-sitting services while on Pain, Sleep and Anxiety Medications; until their outpatient Physician has advised to do so again. Also recommended to not to take more than prescribed Pain, Sleep and Anxiety Medications.   Disposition: Home health Diet recommendation:  Discharge Diet Orders (From admission, onward)     Start     Ordered   05/20/23 0000  Diet - low sodium heart healthy        05/20/23 1108           Cardiac and Carb modified diet DISCHARGE MEDICATION: Allergies as of 05/20/2023       Reactions   Betadine [povidone Iodine]         Medication List     STOP taking these medications    ferrous sulfate 325 (65 FE) MG tablet   losartan 25 MG tablet Commonly known as: COZAAR   metFORMIN 500 MG tablet Commonly known  as: GLUCOPHAGE   sodium bicarbonate 650 MG tablet       TAKE these medications    acetaminophen 500 MG tablet Commonly known as: TYLENOL Take 500 mg by mouth every 6 (six) hours as needed for mild pain.   albuterol 108 (90 Base) MCG/ACT inhaler Commonly known as: VENTOLIN HFA Inhale 2 puffs into the lungs in the morning, at noon, in the evening, and at bedtime.   Betaseron 0.3 MG Kit injection Generic drug: Interferon Beta-1b Inject subcutaneously 1 syringe every other day. What changed:  how much to take how to take this when to take this   cyanocobalamin 500 MCG tablet Commonly known as: VITAMIN B12 Take 1,000 mcg by mouth daily.   furosemide 40 MG tablet Commonly known as: LASIX Take 1 tablet (40 mg total) by mouth every other day. 1/2 pill every other day- Singh What changed: when to take this   gemfibrozil 600 MG tablet Commonly known as: LOPID TAKE 1 TABLET BY MOUTH TWICE  DAILY BEFORE MEALS   insulin glargine 100 UNIT/ML Solostar Pen Commonly known as: LANTUS Inject 10 Units into the skin daily. What changed:  how much to  take when to take this   lidocaine 5 % Commonly known as: LIDODERM Place 1 patch onto the skin daily. Remove & Discard patch within 12 hours or as directed by MD Start taking on: May 21, 2023   loratadine 10 MG tablet Commonly known as: CLARITIN Take 1 tablet (10 mg total) by mouth daily.   mupirocin ointment 2 % Commonly known as: BACTROBAN Apply 1 Application topically 2 (two) times daily. Apply to effected area bid   nystatin powder Commonly known as: MYCOSTATIN/NYSTOP Apply topically 3 (three) times daily.   omeprazole 40 MG capsule Commonly known as: PRILOSEC Take 1 capsule (40 mg total) by mouth daily.   pregabalin 200 MG capsule Commonly known as: LYRICA Take 1 capsule (200 mg total) by mouth 2 (two) times daily.   sertraline 50 MG tablet Commonly known as: ZOLOFT TAKE ONE-HALF TABLET BY MOUTH  DAILY    simvastatin 40 MG tablet Commonly known as: ZOCOR Take 1 tablet (40 mg total) by mouth daily.   tamsulosin 0.4 MG Caps capsule Commonly known as: FLOMAX Take 1 capsule (0.4 mg total) by mouth daily after supper.   traMADol 50 MG tablet Commonly known as: ULTRAM Take 1 tablet (50 mg total) by mouth every 6 (six) hours as needed.               Durable Medical Equipment  (From admission, onward)           Start     Ordered   05/17/23 0940  For home use only DME Other see comment  Once       Comments: Sit to stand chair  Question:  Length of Need  Answer:  Lifetime   05/17/23 0939            Follow-up Information     Duanne Limerick, MD. Schedule an appointment as soon as possible for a visit in 1 week(s).   Specialty: Family Medicine Why: hospital f/u Contact information: [address] [place] [address] [phone]         Riki Altes, MD. Go on 06/02/2023.   Specialty: Urology Why: aug 8th 8:15 am and At 3:30 pm with Sam, Urology PA Contact information: [address] [place] [address] [phone]                Discharge Exam: Ceasar Mons Weights   05/18/23 0500 05/19/23 0316 05/20/23 0111  Weight: 93.6 kg 91.9 kg 91.9 kg   GENERAL:  72 y.o.-year-old patient with no acute distress. Appears chronically ill.   LUNGS:decreased breath sounds bilaterally, no wheezing CARDIOVASCULAR: S1, S2 normal. No murmur   ABDOMEN: Soft, nontender, nondistended.scrtoal and penile edema decreased significantly FOLEY placed 7/26 EXTREMITIES: chronic bilateral edema b/l.    NEUROLOGIC: nonfocal  patient is alert and awake SKIN: per RN  Condition at discharge: fair  The results of significant diagnostics from this hospitalization (including imaging, microbiology, ancillary and laboratory) are listed below for reference.   Imaging Studies: DG Chest Right Decubitus  Result Date: 05/15/2023 CLINICAL DATA:  Hypoxia EXAM:  CHEST - RIGHT DECUBITUS COMPARISON:  05/11/2023 two-view chest x-ray FINDINGS: Right-side-down decubitus film shows a layering small right pleural effusion. Right base collapse/consolidation again noted. Interstitial markings are diffusely coarsened with chronic features. The cardio pericardial silhouette is enlarged. Bones are diffusely demineralized. IMPRESSION: Small layering right pleural effusion. Electronically Signed   By: Kennith Center M.D.   On: 05/15/2023 12:43   DG Chest  2 View  Result Date: 05/11/2023 CLINICAL DATA:  Hypoxia, shortness of breath EXAM: CHEST - 2 VIEW COMPARISON:  Previous studies including the examination done on 05/08/2023 FINDINGS: Cardiac size is within normal limits. There is opacification of right lower lung field suggesting right pleural effusion and underlying atelectasis/pneumonia. There is interval worsening. There is faint 1.2 cm nodular density in right upper lung field with no significant change. In previous PET-CT done on 11/30/2021, levels no hypermetabolic activity in the nodule. Small linear density in left lower lung field may suggest minimal subsegmental atelectasis. Left lateral CP angle is clear. There is no pneumothorax. IMPRESSION: There is increased opacification in right lower lung fields suggesting increase in right pleural effusion and possibly worsening of underlying atelectasis/pneumonia. There is faint nodular density in right upper lung field with no significant change. Electronically Signed   By: Ernie Avena M.D.   On: 05/11/2023 14:23   ECHOCARDIOGRAM COMPLETE  Result Date: 05/09/2023    ECHOCARDIOGRAM REPORT   Patient Name:   Jeremy [name] Date of Exam: 05/09/2023 Medical Rec #:  [id number]     Height:       70.0 in Accession #:    [id number]    Weight:       196.0 lb Date of Birth:  May 30, 1951     BSA:          2.069 m Patient Age:    72 years      BP:           128/47 mmHg Patient Gender: M             HR:           70 bpm. Exam Location:   [facility] Procedure: 2D Echo, Cardiac Doppler and Color Doppler Indications:     CHF---acute diastolic I50.31  History:         Patient has no prior history of Echocardiogram examinations.                  Risk Factors:Diabetes, Hypertension and Dyslipidemia.  Sonographer:     Cristela Blue Referring Phys:  [id number] Marrion Coy Diagnosing Phys: Lorine Bears MD  Sonographer Comments: Suboptimal apical window. IMPRESSIONS  1. Left ventricular ejection fraction, by estimation, is 55 to 60%. The left ventricle has normal function. The left ventricle has no regional wall motion abnormalities. Left ventricular diastolic parameters were normal.  2. Right ventricular systolic function is normal. The right ventricular size is normal. There is moderately elevated pulmonary artery systolic pressure.  3. The mitral valve is normal in structure. No evidence of mitral valve regurgitation. No evidence of mitral stenosis.  4. The aortic valve is normal in structure. Aortic valve regurgitation is not visualized. Aortic valve sclerosis is present, with no evidence of aortic valve stenosis. FINDINGS  Left Ventricle: Left ventricular ejection fraction, by estimation, is 55 to 60%. The left ventricle has normal function. The left ventricle has no regional wall motion abnormalities. The left ventricular internal cavity size was normal in size. There is  no left ventricular hypertrophy. Left ventricular diastolic parameters were normal. Right Ventricle: The right ventricular size is normal. No increase in right ventricular wall thickness. Right ventricular systolic function is normal. There is moderately elevated pulmonary artery systolic pressure. The tricuspid regurgitant velocity is 3.27 m/s, and with an assumed right atrial pressure of 5 mmHg, the estimated right ventricular systolic pressure is 47.8 mmHg. Left Atrium: Left atrial size was normal in size. Right Atrium: Right atrial  size was normal in size. Pericardium: There is no evidence  of pericardial effusion. Mitral Valve: The mitral valve is normal in structure. No evidence of mitral valve regurgitation. No evidence of mitral valve stenosis. Tricuspid Valve: The tricuspid valve is normal in structure. Tricuspid valve regurgitation is mild . No evidence of tricuspid stenosis. Aortic Valve: The aortic valve is normal in structure. Aortic valve regurgitation is not visualized. Aortic valve sclerosis is present, with no evidence of aortic valve stenosis. Aortic valve mean gradient measures 4.5 mmHg. Aortic valve peak gradient measures 8.5 mmHg. Aortic valve area, by VTI measures 2.96 cm. Pulmonic Valve: The pulmonic valve was normal in structure. Pulmonic valve regurgitation is not visualized. No evidence of pulmonic stenosis. Aorta: The aortic root is normal in size and structure. Venous: The inferior vena cava was not well visualized. IAS/Shunts: No atrial level shunt detected by color flow Doppler.  LEFT VENTRICLE PLAX 2D LVIDd:         5.10 cm   Diastology LVIDs:         3.60 cm   LV e' medial:    13.70 cm/s LV PW:         0.70 cm   LV E/e' medial:  8.0 LV IVS:        1.40 cm   LV e' lateral:   15.30 cm/s LVOT diam:     2.00 cm   LV E/e' lateral: 7.2 LV SV:         79 LV SV Index:   38 LVOT Area:     3.14 cm  RIGHT VENTRICLE RV Basal diam:  4.20 cm RV Mid diam:    4.10 cm LEFT ATRIUM             Index        RIGHT ATRIUM           Index LA diam:        3.70 cm 1.79 cm/m   RA Area:     19.80 cm LA Vol (A2C):   59.9 ml 28.95 ml/m  RA Volume:   63.10 ml  30.49 ml/m LA Vol (A4C):   54.5 ml 26.34 ml/m LA Biplane Vol: 62.2 ml 30.06 ml/m  AORTIC VALVE AV Area (Vmax):    2.58 cm AV Area (Vmean):   2.75 cm AV Area (VTI):     2.96 cm AV Vmax:           146.00 cm/s AV Vmean:          99.650 cm/s AV VTI:            0.268 m AV Peak Grad:      8.5 mmHg AV Mean Grad:      4.5 mmHg LVOT Vmax:         120.00 cm/s LVOT Vmean:        87.300 cm/s LVOT VTI:          0.253 m LVOT/AV VTI ratio: 0.94  AORTA  Ao Root diam: 3.10 cm MITRAL VALVE                TRICUSPID VALVE MV Area (PHT): 3.24 cm     TR Peak grad:   42.8 mmHg MV Decel Time: 234 msec     TR Vmax:        327.00 cm/s MV E velocity: 110.00 cm/s MV A velocity: 99.60 cm/s   SHUNTS MV E/A ratio:  1.10         Systemic  VTI:  0.25 m                             Systemic Diam: 2.00 cm Lorine Bears MD Electronically signed by Lorine Bears MD Signature Date/Time: 05/09/2023/10:24:36 AM    Final    DG Chest 2 View  Result Date: 05/08/2023 CLINICAL DATA:  Pneumonia, shortness of breath EXAM: CHEST - 2 VIEW COMPARISON:  05/06/2023 FINDINGS: Improved aeration at the left lung base. Right lower lobe airspace opacity with blunted right lateral costophrenic angle favoring pleural effusion and pneumonia/atelectasis. Stable peripheral 1.3 cm right upper lobe pulmonary nodule, reportedly previously not hypermetabolic on PET-CT. Prior cross-sectional imaging workups have not recommended follow up of this lesion. Heart size is within normal limits on the AP frontal projection. Atherosclerotic calcification of the aortic arch. IMPRESSION: 1. Right lower lobe airspace opacity with blunted right lateral costophrenic angle favoring pleural effusion and pneumonia/atelectasis. 2. Improved aeration at the left lung base. 3. Stable peripheral 1.3 cm right upper lobe pulmonary nodule, reportedly previously not hypermetabolic on PET-CT. Prior cross-sectional imaging workups have not recommended follow up of this lesion. 4.  Aortic Atherosclerosis (ICD10-I70.0). Electronically Signed   By: Gaylyn Rong M.D.   On: 05/08/2023 19:27   US Abdomen Limited RUQ (LIVER/GB)  Result Date: 05/07/2023 CLINICAL DATA:  Liver cirrhosis. EXAM: ULTRASOUND ABDOMEN LIMITED RIGHT UPPER QUADRANT COMPARISON:  Body CT 02/03/2022 FINDINGS: Gallbladder: No gallbladder wall thickening visualized. No sonographic Murphy sign noted by sonographer. Two calculi are seen within the gallbladder measuring  up to 1 cm. Common bile duct: Diameter: 5 mm Liver: No focal lesion identified. Increased parenchymal echogenicity and lobular contour. Portal vein is patent on color Doppler imaging with normal direction of blood flow towards the liver. Other: Trace ascitic fluid.  Right pleural effusion. IMPRESSION: Cirrhotic appearance of the liver. Cholelithiasis without sonographic evidence of cholecystitis. Electronically Signed   By: Ted Mcalpine M.D.   On: 05/07/2023 14:48   DG Chest Portable 1 View  Result Date: 05/06/2023 CLINICAL DATA:  Shortness of breath EXAM: PORTABLE CHEST 1 VIEW COMPARISON:  Chest radiograph 11/24/2022 FINDINGS: The heart is enlarged.  The upper mediastinal contours are normal There are diffusely increased interstitial markings likely reflecting pulmonary interstitial edema. Previously seen opacity in the left midlung has resolved. There is no focal consolidation. There is a small right pleural effusion. There is no significant left effusion. There is no pneumothorax There is no acute osseous abnormality. IMPRESSION: Cardiomegaly with a small right pleural effusion and pulmonary interstitial edema. Findings may reflect CHF. Electronically Signed   By: Lesia Hausen M.D.   On: 05/06/2023 11:20    Microbiology: Results for orders placed or performed during the hospital encounter of 05/06/23  Culture, blood (Routine X 2) w Reflex to ID Panel     Status: None   Collection Time: 05/08/23  3:12 PM   Specimen: BLOOD  Result Value Ref Range Status   Specimen Description BLOOD BLOOD RIGHT ARM  Final   Special Requests   Final    BOTTLES DRAWN AEROBIC AND ANAEROBIC Blood Culture results may not be optimal due to an excessive volume of blood received in culture bottles   Culture   Final    NO GROWTH 5 DAYS Performed at [facility], [address], [place], [place] [address]    Report Status 05/13/2023 FINAL  Final  Culture, blood (Routine X 2) w Reflex to ID Panel  Status: None   Collection Time: 05/08/23  3:21 PM   Specimen: BLOOD  Result Value Ref Range Status   Specimen Description BLOOD BLOOD RIGHT HAND  Final   Special Requests   Final    BOTTLES DRAWN AEROBIC AND ANAEROBIC Blood Culture adequate volume   Culture   Final    NO GROWTH 5 DAYS Performed at [facility], [address] [place], [place] [address]    Report Status 05/13/2023 FINAL  Final  SARS Coronavirus 2 by RT PCR (hospital order, performed in [facility] hospital lab) *cepheid single result test* Anterior Nasal Swab     Status: None   Collection Time: 05/08/23  3:25 PM   Specimen: Anterior Nasal Swab  Result Value Ref Range Status   SARS Coronavirus 2 by RT PCR NEGATIVE NEGATIVE Final    Comment: (NOTE) SARS-CoV-2 target nucleic acids are NOT DETECTED.  The SARS-CoV-2 RNA is generally detectable in upper and lower respiratory specimens during the acute phase of infection. The lowest concentration of SARS-CoV-2 viral copies this assay can detect is 250 copies / mL. A negative result does not preclude SARS-CoV-2 infection and should not be used as the sole basis for treatment or other patient management decisions.  A negative result may occur with improper specimen collection / handling, submission of specimen other than nasopharyngeal swab, presence of viral mutation(s) within the areas targeted by this assay, and inadequate number of viral copies (<250 copies / mL). A negative result must be combined with clinical observations, patient history, and epidemiological information.  Fact Sheet for Patients:   RoadLapTop.co.za  Fact Sheet for Healthcare Providers: http://kim-miller.com/  This test is not yet approved or  cleared by the [place] FDA and has been authorized for detection and/or diagnosis of SARS-CoV-2 by FDA under an Emergency Use Authorization (EUA).  This EUA will remain in effect (meaning this  test can be used) for the duration of the COVID-19 declaration under Section 564(b)(1) of the Act, 21 U.S.C. section 360bbb-3(b)(1), unless the authorization is terminated or revoked sooner.  Performed at [facility], [address] [place], [place] [address]     Labs: CBC: Recent Labs  Lab 05/14/23 [phone] 05/15/23 0544 05/16/23 0745 05/17/23 0302  WBC 4.9 4.8  --   --   HGB 7.9* 8.0* 7.8* 8.6*  HCT 25.0* 25.9*  --   --   MCV 91.2 91.8  --   --   PLT 220 227  --   --    Basic Metabolic Panel: Recent Labs  Lab 05/14/23 0629 05/15/23 0544 05/16/23 0745 05/17/23 0302 05/19/23 0324  NA 133* 132* 134* 133* 135  K 4.7 4.8 4.9 4.8 4.5  CL 103 104 103 103 104  CO2 24 24 24 25 26   GLUCOSE 76 80 74 79 89  BUN 30* 32* 28* 32* 33*  CREATININE 1.31* 1.37* 1.19 1.37* 1.46*  CALCIUM 7.6* 7.7* 7.9* 7.6* 7.9*   Liver Function Tests: Recent Labs  Lab 05/15/23 0544  AST 33  ALT 18  ALKPHOS 108  BILITOT 0.6  PROT 5.7*  ALBUMIN 2.0*   CBG: Recent Labs  Lab 05/19/23 1125 05/19/23 1232 05/19/23 1625 05/19/23 2017 05/20/23 0822  GLUCAP 161* 161* 138* 173* 106*    Discharge time spent: greater than 30 minutes.  Signed: Enedina Finner, MD [facility] 05/20/2023

## 2023-05-23 ENCOUNTER — Telehealth: Payer: Self-pay | Admitting: *Deleted

## 2023-05-23 NOTE — Transitions of Care (Post Inpatient/ED Visit) (Signed)
05/23/2023  Name: Jeremy [name] MRN: [id number] DOB: [birth year]  Today's TOC FU Call Status: Today's TOC FU Call Status:: Successful TOC FU Call Competed TOC FU Call Complete Date: 05/23/23  Transition Care Management Follow-up Telephone Call Date of Discharge: 05/20/23 Discharge Facility: [facility]) Type of Discharge: Inpatient Admission Primary Inpatient Discharge Diagnosis:: Acute on chronic respiratory failure with hypoxia How have you been since you were released from the hospital?: Better Any questions or concerns?: No  Items Reviewed: Did you receive and understand the discharge instructions provided?: Yes Medications obtained,verified, and reconciled?: Yes (Medications Reviewed) Any new allergies since your discharge?: No Dietary orders reviewed?: No Do you have support at home?: Yes People in Home: spouse Name of Support/Comfort Primary Source: Deatra Ina  Medications Reviewed Today: Medications Reviewed Today     Reviewed by Luella Cook, RN (Case Manager) on 05/23/23 at 1122  Med List Status: <None>   Medication Order Taking? Sig Documenting Provider Last Dose Status Informant  acetaminophen (TYLENOL) 500 MG tablet [id number] Yes Take 500 mg by mouth every 6 (six) hours as needed for mild pain. [provider] Taking Active Spouse/Significant Other, Pharmacy Records           Med Note Excell Seltzer, Gaetana Michaelis   Fri May 06, 2023  2:41 PM) Takes 2x tabs 30 minutes before Betaseron inj  albuterol (VENTOLIN HFA) 108 (90 Base) MCG/ACT inhaler [id number] Yes Inhale 2 puffs into the lungs in the morning, at noon, in the evening, and at bedtime. [provider] Taking Active Spouse/Significant Other, Pharmacy Records  cyanocobalamin (VITAMIN B12) 500 MCG tablet [id number] Yes Take 1,000 mcg by mouth daily. [provider] Taking Active Spouse/Significant Other, Pharmacy Records  furosemide (LASIX) 40 MG tablet [id number] Yes Take 1  tablet (40 mg total) by mouth every other day. 1/2 pill every other day- Lily Peer, Jearl Klinefelter, MD Taking Active   gemfibrozil (LOPID) 600 MG tablet [id number] Yes TAKE 1 TABLET BY MOUTH TWICE  DAILY BEFORE MEALS Duanne Limerick, MD Taking Active Spouse/Significant Other, Pharmacy Records  insulin glargine (LANTUS) 100 UNIT/ML Solostar Pen [id number] Yes Inject 10 Units into the skin daily. Enedina Finner, MD Taking Active   Interferon Beta-1b (BETASERON) 0.3 MG KIT injection [id number] Yes Inject subcutaneously 1 syringe every other day.  Patient taking differently: Inject 0.25 mg into the skin every other day. Inject subcutaneously 1 syringe every other day.   Levert Feinstein, MD Taking Active Spouse/Significant Other, Pharmacy Records           Med Note Excell Seltzer, Gaetana Michaelis   Fri May 06, 2023  2:42 PM) Next inj 7/12; home med  lidocaine (LIDODERM) 5 % [id number] Yes Place 1 patch onto the skin daily. Remove & Discard patch within 12 hours or as directed by MD Enedina Finner, MD Taking Active   loratadine (CLARITIN) 10 MG tablet [id number] Yes Take 1 tablet (10 mg total) by mouth daily. Duanne Limerick, MD Taking Active Spouse/Significant Other, Pharmacy Records  mupirocin ointment (BACTROBAN) 2 % [id number] Yes Apply 1 Application topically 2 (two) times daily. Apply to effected area bid Duanne Limerick, MD Taking Active Spouse/Significant Other, Pharmacy Records           Med Note Excell Seltzer, Gaetana Michaelis   Fri May 06, 2023  2:45 PM) Patient takes as needed for skin tears  nystatin (MYCOSTATIN/NYSTOP) powder [id number] Yes Apply topically 3 (three) times daily. Enedina Finner, MD Taking Active   omeprazole (PRILOSEC) 40 MG capsule  [id number] Yes Take 1 capsule (40 mg total) by mouth daily. Enedina Finner, MD Taking Active   pregabalin (LYRICA) 200 MG capsule [id number] Yes Take 1 capsule (200 mg total) by mouth 2 (two) times daily. Edward Jolly, MD Taking Active Spouse/Significant Other, Pharmacy Records  sertraline (ZOLOFT) 50 MG tablet  [id number] Yes TAKE ONE-HALF TABLET BY MOUTH  DAILY Duanne Limerick, MD Taking Active Spouse/Significant Other, Pharmacy Records  simvastatin (ZOCOR) 40 MG tablet [id number] Yes Take 1 tablet (40 mg total) by mouth daily. Duanne Limerick, MD Taking Active Spouse/Significant Other, Pharmacy Records  tamsulosin [facility]) 0.4 MG CAPS capsule [id number] Yes Take 1 capsule (0.4 mg total) by mouth daily after supper. Enedina Finner, MD Taking Active   traMADol Janean Sark) 50 MG tablet [id number] Yes Take 1 tablet (50 mg total) by mouth every 6 (six) hours as needed. Edward Jolly, MD Taking Active Spouse/Significant Other, Pharmacy Records           Med Note Excell Seltzer, Gaetana Michaelis   Fri May 06, 2023  2:47 PM) Patient takes 4x daily regularly for pain  Med List Note Nonah Mattes, RN 12/30/22 1005): UDS 12/16/22 MR 04/07/23 Pain contracts signed 12/30/22            Home Care and Equipment/Supplies: Were Home Health Services Ordered?: Yes Name of Home Health Agency:: [facility] Has Agency set up a time to come to your home?: Yes First Home Health Visit Date: 05/24/23 Any new equipment or medical supplies ordered?: Yes Name of Medical supply agency?: [employer] Were you able to get the equipment/medical supplies?: Yes Do you have any questions related to the use of the equipment/supplies?: Yes What questions do you have?: It is lifting slow. [employer] is coming to bring out a new lift  Functional Questionnaire: Do you need assistance with bathing/showering or dressing?: Yes Do you need assistance with meal preparation?: Yes Do you need assistance with eating?: No Do you have difficulty maintaining continence: No Do you need assistance with getting out of bed/getting out of a chair/moving?: Yes Do you have difficulty managing or taking your medications?: Yes  Follow up appointments reviewed: PCP Follow-up appointment confirmed?: No MD Provider Line [phone] Given: Yes (Wife is picking up a new hanicapp  Zenaida Niece today and wanted to make sure she had transportation before making appt) Specialist Hospital Follow-up appointment confirmed?: Yes Date of Specialist follow-up appointment?: 06/02/23 Follow-Up Specialty Provider:: Dr Lonna Cobb Do you need transportation to your follow-up appointment?: Yes Transportation Need Intervention Addressed By:: Other: (Patient is to get a handicap Zenaida Niece today) Do you understand care options if your condition(s) worsen?: Yes-patient verbalized understanding  SDOH Interventions Today    Flowsheet Row Most Recent Value  SDOH Interventions   Food Insecurity Interventions Intervention Not Indicated  Housing Interventions Intervention Not Indicated  Transportation Interventions Intervention Not Indicated, Patient Resources (Friends/Family)      Interventions Today    Flowsheet Row Most Recent Value  General Interventions   General Interventions Discussed/Reviewed General Interventions Discussed, General Interventions Reviewed, Doctor Visits, Durable Medical Equipment (DME)  Durable Medical Equipment (DME) Other  [sit to stand lift will be replaced by  [employer]. Moving very slow in lifting]  Exercise Interventions   Exercise Discussed/Reviewed Exercise Discussed  [[facility] will start PT]  Pharmacy Interventions   Pharmacy Dicussed/Reviewed Pharmacy Topics Discussed, Pharmacy Topics Reviewed  [RN reviewed medications and wife was not aware of the insulin dosages. RN made aware of the changes.]      TOC Interventions Today  Flowsheet Row Most Recent Value  TOC Interventions   TOC Interventions Discussed/Reviewed TOC Interventions Discussed, TOC Interventions Reviewed       Gean Maidens BSN RN [facility] Care [facility] [phone]

## 2023-05-24 DIAGNOSIS — J449 Chronic obstructive pulmonary disease, unspecified: Secondary | ICD-10-CM | POA: Diagnosis not present

## 2023-05-24 DIAGNOSIS — D631 Anemia in chronic kidney disease: Secondary | ICD-10-CM | POA: Diagnosis not present

## 2023-05-24 DIAGNOSIS — E1122 Type 2 diabetes mellitus with diabetic chronic kidney disease: Secondary | ICD-10-CM | POA: Diagnosis not present

## 2023-05-24 DIAGNOSIS — I130 Hypertensive heart and chronic kidney disease with heart failure and stage 1 through stage 4 chronic kidney disease, or unspecified chronic kidney disease: Secondary | ICD-10-CM | POA: Diagnosis not present

## 2023-05-24 DIAGNOSIS — I5033 Acute on chronic diastolic (congestive) heart failure: Secondary | ICD-10-CM | POA: Diagnosis not present

## 2023-05-24 DIAGNOSIS — N1831 Chronic kidney disease, stage 3a: Secondary | ICD-10-CM | POA: Diagnosis not present

## 2023-05-27 ENCOUNTER — Ambulatory Visit (INDEPENDENT_AMBULATORY_CARE_PROVIDER_SITE_OTHER): Payer: Medicare Other | Admitting: Family Medicine

## 2023-05-27 ENCOUNTER — Encounter: Payer: Self-pay | Admitting: Family Medicine

## 2023-05-27 ENCOUNTER — Ambulatory Visit
Admission: RE | Admit: 2023-05-27 | Discharge: 2023-05-27 | Disposition: A | Discharge status: Home / Self Care | Payer: Medicare Other | Attending: Family Medicine | Admitting: Family Medicine

## 2023-05-27 ENCOUNTER — Ambulatory Visit
Admission: RE | Admit: 2023-05-27 | Discharge: 2023-05-27 | Disposition: A | Discharge status: Home / Self Care | Payer: Medicare Other | Source: Ambulatory Visit | Attending: Family Medicine | Admitting: Family Medicine

## 2023-05-27 ENCOUNTER — Other Ambulatory Visit
Admission: RE | Admit: 2023-05-27 | Discharge: 2023-05-27 | Disposition: A | Discharge status: Home / Self Care | Payer: Medicare Other | Source: Home / Self Care | Attending: Family Medicine | Admitting: Family Medicine

## 2023-05-27 VITALS — BP 110/56 | HR 70

## 2023-05-27 DIAGNOSIS — D689 Coagulation defect, unspecified: Secondary | ICD-10-CM | POA: Diagnosis not present

## 2023-05-27 DIAGNOSIS — R918 Other nonspecific abnormal finding of lung field: Secondary | ICD-10-CM | POA: Diagnosis not present

## 2023-05-27 DIAGNOSIS — J9621 Acute and chronic respiratory failure with hypoxia: Secondary | ICD-10-CM | POA: Diagnosis not present

## 2023-05-27 DIAGNOSIS — J440 Chronic obstructive pulmonary disease with acute lower respiratory infection: Secondary | ICD-10-CM | POA: Diagnosis not present

## 2023-05-27 DIAGNOSIS — E785 Hyperlipidemia, unspecified: Secondary | ICD-10-CM | POA: Diagnosis not present

## 2023-05-27 DIAGNOSIS — K7469 Other cirrhosis of liver: Secondary | ICD-10-CM | POA: Diagnosis not present

## 2023-05-27 DIAGNOSIS — E1142 Type 2 diabetes mellitus with diabetic polyneuropathy: Secondary | ICD-10-CM | POA: Diagnosis not present

## 2023-05-27 DIAGNOSIS — K2100 Gastro-esophageal reflux disease with esophagitis, without bleeding: Secondary | ICD-10-CM | POA: Diagnosis not present

## 2023-05-27 DIAGNOSIS — Z09 Encounter for follow-up examination after completed treatment for conditions other than malignant neoplasm: Secondary | ICD-10-CM

## 2023-05-27 DIAGNOSIS — K2950 Unspecified chronic gastritis without bleeding: Secondary | ICD-10-CM | POA: Diagnosis not present

## 2023-05-27 DIAGNOSIS — E1169 Type 2 diabetes mellitus with other specified complication: Secondary | ICD-10-CM | POA: Diagnosis not present

## 2023-05-27 DIAGNOSIS — N1831 Chronic kidney disease, stage 3a: Secondary | ICD-10-CM

## 2023-05-27 DIAGNOSIS — D631 Anemia in chronic kidney disease: Secondary | ICD-10-CM | POA: Diagnosis not present

## 2023-05-27 DIAGNOSIS — G35 Multiple sclerosis: Secondary | ICD-10-CM | POA: Diagnosis not present

## 2023-05-27 DIAGNOSIS — Z87891 Personal history of nicotine dependence: Secondary | ICD-10-CM | POA: Diagnosis not present

## 2023-05-27 DIAGNOSIS — K922 Gastrointestinal hemorrhage, unspecified: Secondary | ICD-10-CM

## 2023-05-27 DIAGNOSIS — R339 Retention of urine, unspecified: Secondary | ICD-10-CM | POA: Diagnosis not present

## 2023-05-27 DIAGNOSIS — G894 Chronic pain syndrome: Secondary | ICD-10-CM | POA: Diagnosis not present

## 2023-05-27 DIAGNOSIS — J189 Pneumonia, unspecified organism: Secondary | ICD-10-CM | POA: Diagnosis not present

## 2023-05-27 DIAGNOSIS — I272 Pulmonary hypertension, unspecified: Secondary | ICD-10-CM | POA: Diagnosis not present

## 2023-05-27 DIAGNOSIS — F330 Major depressive disorder, recurrent, mild: Secondary | ICD-10-CM | POA: Diagnosis not present

## 2023-05-27 DIAGNOSIS — I700 Atherosclerosis of aorta: Secondary | ICD-10-CM | POA: Diagnosis not present

## 2023-05-27 DIAGNOSIS — R911 Solitary pulmonary nodule: Secondary | ICD-10-CM | POA: Diagnosis not present

## 2023-05-27 DIAGNOSIS — I130 Hypertensive heart and chronic kidney disease with heart failure and stage 1 through stage 4 chronic kidney disease, or unspecified chronic kidney disease: Secondary | ICD-10-CM | POA: Diagnosis not present

## 2023-05-27 DIAGNOSIS — J188 Other pneumonia, unspecified organism: Secondary | ICD-10-CM | POA: Diagnosis not present

## 2023-05-27 DIAGNOSIS — Q2730 Arteriovenous malformation, site unspecified: Secondary | ICD-10-CM | POA: Diagnosis not present

## 2023-05-27 DIAGNOSIS — E1122 Type 2 diabetes mellitus with diabetic chronic kidney disease: Secondary | ICD-10-CM | POA: Diagnosis not present

## 2023-05-27 DIAGNOSIS — D500 Iron deficiency anemia secondary to blood loss (chronic): Secondary | ICD-10-CM | POA: Diagnosis not present

## 2023-05-27 DIAGNOSIS — I5033 Acute on chronic diastolic (congestive) heart failure: Secondary | ICD-10-CM | POA: Diagnosis not present

## 2023-05-27 LAB — CBC WITH DIFFERENTIAL/PLATELET
Abs Immature Granulocytes: 0.02 10*3/uL (ref 0.00–0.07)
Basophils Absolute: 0.1 10*3/uL (ref 0.0–0.1)
Basophils Relative: 1 %
Eosinophils Absolute: 0.3 10*3/uL (ref 0.0–0.5)
Eosinophils Relative: 5 %
HCT: 33.9 % — ABNORMAL LOW (ref 39.0–52.0)
Hemoglobin: 10.6 g/dL — ABNORMAL LOW (ref 13.0–17.0)
Immature Granulocytes: 0 %
Lymphocytes Relative: 9 %
Lymphs Abs: 0.6 10*3/uL — ABNORMAL LOW (ref 0.7–4.0)
MCH: 28.2 pg (ref 26.0–34.0)
MCHC: 31.3 g/dL (ref 30.0–36.0)
MCV: 90.2 fL (ref 80.0–100.0)
Monocytes Absolute: 0.6 10*3/uL (ref 0.1–1.0)
Monocytes Relative: 10 %
Neutro Abs: 4.5 10*3/uL (ref 1.7–7.7)
Neutrophils Relative %: 75 %
Platelets: 226 10*3/uL (ref 150–400)
RBC: 3.76 MIL/uL — ABNORMAL LOW (ref 4.22–5.81)
RDW: 18.4 % — ABNORMAL HIGH (ref 11.5–15.5)
WBC: 6.1 10*3/uL (ref 4.0–10.5)
nRBC: 0 % (ref 0.0–0.2)

## 2023-05-27 NOTE — Progress Notes (Signed)
Date:  05/27/2023   Name:  Jeremy [name]   DOB:  Mar 22, 1951   MRN:  [id number]   Chief Complaint: Hospitalization Follow-up (Admitted on 7/12 and d/c on 7/26- TOC call placed on 05/23/23. Got 3 iron infusions and a shot. Goes to Dr Cathie Hoops on 06/06/23.)  Follow up Hospitalization  Patient was admitted to [facility] on 7/12 and discharged on 7/26. He was treated for resp failure/gi bleed./pneumonia Treatment for this included transfusion/.antibiotics Telephone follow up was done on 7/29 He reports good compliance with treatment. He reports this condition is same condition as discharged.  ----------------------------------------------------------------------------------------- -       Lab Results  Component Value Date   NA 135 05/19/2023   K 4.5 05/19/2023   CO2 26 05/19/2023   GLUCOSE 89 05/19/2023   BUN 33 (H) 05/19/2023   CREATININE 1.46 (H) 05/19/2023   CALCIUM 7.9 (L) 05/19/2023   EGFR 53 (L) 02/15/2022   GFRNONAA 51 (L) 05/19/2023   Lab Results  Component Value Date   CHOL 103 07/10/2021   HDL 22 (L) 07/10/2021   LDLCALC 35 07/10/2021   TRIG 228 (H) 07/10/2021   CHOLHDL 4.7 07/10/2021   Lab Results  Component Value Date   TSH 1.90 03/30/2021   Lab Results  Component Value Date   HGBA1C 4.4 (L) 11/24/2022   Lab Results  Component Value Date   WBC 4.8 05/15/2023   HGB 8.6 (L) 05/17/2023   HCT 25.9 (L) 05/15/2023   MCV 91.8 05/15/2023   PLT 227 05/15/2023   Lab Results  Component Value Date   ALT 18 05/15/2023   AST 33 05/15/2023   ALKPHOS 108 05/15/2023   BILITOT 0.6 05/15/2023   No results found for: "25OHVITD2", "25OHVITD3", "VD25OH"   Review of Systems  Constitutional:  Negative for fatigue.       All values relative to discharge  Respiratory:  Negative for shortness of breath.   Cardiovascular:  Negative for chest pain.  Gastrointestinal:  Negative for blood in stool.    Patient Active Problem List   Diagnosis Date Noted   Myocardial  injury 05/11/2023   Moderate pulmonary hypertension (HCC) 05/09/2023   Cirrhosis of liver without ascites (HCC) 05/08/2023   Acute on chronic diastolic CHF (congestive heart failure) (HCC) 05/08/2023   Symptomatic anemia 05/06/2023   GI bleed 11/24/2022   Arteriovenous malformation (AVM) 10/21/2022   Generalized weakness 08/18/2022   AKI (acute kidney injury) (HCC) 08/18/2022   Severe sepsis (HCC) 02/04/2022   Right lower lobe pneumonia 02/03/2022   Acute on chronic respiratory failure with hypoxia (HCC) 02/03/2022   Hyperkalemia 02/03/2022   COPD (chronic obstructive pulmonary disease) (HCC) 02/03/2022   SIRS (systemic inflammatory response syndrome), possible sepsis (HCC) 02/03/2022   Chronic blood loss anemia 09/24/2021   Gastric polyp    Gastritis without bleeding    Anemia in chronic kidney disease (CKD) 06/19/2021   Iron deficiency anemia due to chronic blood loss 06/19/2021   Gait abnormality 05/07/2021   Stage 3a chronic kidney disease (HCC) 05/20/2020   DM type 2 with diabetic peripheral neuropathy (HCC) 05/06/2020   Idiopathic peripheral neuropathy 01/15/2020   Generalized edema 01/15/2020   Primary pulmonary hypertension (HCC) 10/28/2019   Therapeutic drug monitoring 03/23/2018   Mild episode of recurrent major depressive disorder (HCC) 09/26/2017   Ventricular ectopic beats 09/26/2017   Type 2 diabetes mellitus with diabetic polyneuropathy, with long-term current use of insulin (HCC) 08/12/2017   Hyperlipidemia due to type 2 diabetes  mellitus (HCC) 08/12/2017   B12 deficiency 12/14/2016   Low back pain 03/10/2016   Lumbosacral disc disease 01/01/2016   Neuropathy associated with endocrine disorder (HCC) 11/19/2015   Iron deficiency anemia 06/03/2015   Essential hypertension 06/03/2015   Hyperlipidemia 06/03/2015   Depression 06/03/2015   Gastroesophageal reflux disease without esophagitis 06/03/2015   Edema extremities 06/03/2015   Multiple sclerosis (HCC)  07/26/2013   Abnormality of gait 07/26/2013   Morbid obesity (HCC) 07/26/2013    Allergies  Allergen Reactions   Betadine [Povidone Iodine]     Past Surgical History:  Procedure Laterality Date   BIOPSY  05/07/2023   Procedure: BIOPSY;  Surgeon: Regis Bill, MD;  Location: [facility] ENDOSCOPY;  Service: Endoscopy;;   COLECTOMY  06-2008   COLONOSCOPY  2015   normal   COLONOSCOPY WITH PROPOFOL N/A 09/10/2021   Procedure: COLONOSCOPY WITH PROPOFOL;  Surgeon: Midge Minium, MD;  Location: [facility] ENDOSCOPY;  Service: Endoscopy;  Laterality: N/A;   ENTEROSCOPY N/A 05/07/2023   Procedure: ENTEROSCOPY;  Surgeon: Regis Bill, MD;  Location: [facility] ENDOSCOPY;  Service: Endoscopy;  Laterality: N/A;   ESOPHAGOGASTRODUODENOSCOPY (EGD) WITH PROPOFOL N/A 09/10/2021   Procedure: ESOPHAGOGASTRODUODENOSCOPY (EGD) WITH PROPOFOL;  Surgeon: Midge Minium, MD;  Location: [facility] ENDOSCOPY;  Service: Endoscopy;  Laterality: N/A;   GIVENS CAPSULE STUDY N/A 09/25/2021   Procedure: GIVENS CAPSULE STUDY;  Surgeon: Wyline Mood, MD;  Location: [facility] ENDOSCOPY;  Service: Gastroenterology;  Laterality: N/A;    Social History   Tobacco Use   Smoking status: Former    Current packs/day: 0.00    Average packs/day: 1.5 packs/day for 30.0 years (45.0 ttl pk-yrs)    Types: Cigarettes    Start date: 01/24/1976    Quit date: 01/23/2006    Years since quitting: 17.3   Smokeless tobacco: Never   Tobacco comments:    N/A  Vaping Use   Vaping status: Never Used  Substance Use Topics   Alcohol use: Not Currently    Comment: rare; maybe 2 beers a year   Drug use: No     Medication list has been reviewed and updated.  Current Meds  Medication Sig   acetaminophen (TYLENOL) 500 MG tablet Take 500 mg by mouth every 6 (six) hours as needed for mild pain.   albuterol (VENTOLIN HFA) 108 (90 Base) MCG/ACT inhaler Inhale 2 puffs into the lungs in the morning, at noon, in the evening, and at bedtime.   cyanocobalamin  (VITAMIN B12) 500 MCG tablet Take 1,000 mcg by mouth daily.   furosemide (LASIX) 40 MG tablet Take 1 tablet (40 mg total) by mouth every other day. 1/2 pill every other day- Singh   gemfibrozil (LOPID) 600 MG tablet TAKE 1 TABLET BY MOUTH TWICE  DAILY BEFORE MEALS   insulin glargine (LANTUS) 100 UNIT/ML Solostar Pen Inject 10 Units into the skin daily.   Interferon Beta-1b (BETASERON) 0.3 MG KIT injection Inject subcutaneously 1 syringe every other day. (Patient taking differently: Inject 0.25 mg into the skin every other day. Inject subcutaneously 1 syringe every other day.)   lidocaine (LIDODERM) 5 % Place 1 patch onto the skin daily. Remove & Discard patch within 12 hours or as directed by MD   loratadine (CLARITIN) 10 MG tablet Take 1 tablet (10 mg total) by mouth daily.   mupirocin ointment (BACTROBAN) 2 % Apply 1 Application topically 2 (two) times daily. Apply to effected area bid   nystatin (MYCOSTATIN/NYSTOP) powder Apply topically 3 (three) times daily.   omeprazole (  PRILOSEC) 40 MG capsule Take 1 capsule (40 mg total) by mouth daily.   pregabalin (LYRICA) 200 MG capsule Take 1 capsule (200 mg total) by mouth 2 (two) times daily.   sertraline (ZOLOFT) 50 MG tablet TAKE ONE-HALF TABLET BY MOUTH  DAILY   simvastatin (ZOCOR) 40 MG tablet Take 1 tablet (40 mg total) by mouth daily.   tamsulosin (FLOMAX) 0.4 MG CAPS capsule Take 1 capsule (0.4 mg total) by mouth daily after supper.   traMADol (ULTRAM) 50 MG tablet Take 1 tablet (50 mg total) by mouth every 6 (six) hours as needed.       05/27/2023    2:41 PM 12/31/2022    1:16 PM 12/10/2022    2:19 PM 11/16/2022    3:56 PM  GAD 7 : Generalized Anxiety Score  Nervous, Anxious, on Edge 0 0 0 0  Control/stop worrying 0 0 0 0  Worry too much - different things 0 0 0 0  Trouble relaxing 0 0 0 0  Restless 0 0 0 0  Easily annoyed or irritable 0 0 0 0  Afraid - awful might happen 0 0 0 0  Total GAD 7 Score 0 0 0 0  Anxiety Difficulty Not  difficult at all Not difficult at all Not difficult at all Not difficult at all       05/27/2023    2:39 PM 12/31/2022    1:16 PM 12/10/2022    2:19 PM  Depression screen PHQ 2/9  Decreased Interest 0 0 0  Down, Depressed, Hopeless 0 0 0  PHQ - 2 Score 0 0 0  Altered sleeping 0 0 0  Tired, decreased energy 0 0 0  Change in appetite 0 0 0  Feeling bad or failure about yourself  0 0 0  Trouble concentrating 0 0 0  Moving slowly or fidgety/restless 0 0 0  Suicidal thoughts 0 0 0  PHQ-9 Score 0 0 0  Difficult doing work/chores Not difficult at all Not difficult at all Not difficult at all    BP Readings from Last 3 Encounters:  05/27/23 (!) 110/56  05/20/23 (!) 128/41  05/05/23 (!) 141/48    Physical Exam Vitals and nursing note reviewed.  HENT:     Mouth/Throat:     Mouth: Mucous membranes are moist.     Pharynx: No oropharyngeal exudate or posterior oropharyngeal erythema.  Eyes:     General:        Right eye: No discharge.        Left eye: No discharge.  Cardiovascular:     Rate and Rhythm: Normal rate and regular rhythm.     Heart sounds: No murmur heard.    No friction rub. No gallop.  Pulmonary:     Breath sounds: No wheezing, rhonchi or rales.     Comments: Difficult auscultation secondary to transmitted noise oxygen extractor Abdominal:     Tenderness: There is no abdominal tenderness.  Musculoskeletal:     Cervical back: Normal range of motion.     Wt Readings from Last 3 Encounters:  05/20/23 202 lb 9.6 oz (91.9 kg)  05/03/23 196 lb (88.9 kg)  04/07/23 195 lb 9.6 oz (88.7 kg)    BP (!) 110/56   Pulse 70   SpO2 (!) 73%   [facility] AT [facility]  Transitional [facility] Discharge Acute Issues Care Follow Up                                                                        Patient Demographics  Jamear Carbonneau, is a 72  y.o. male  DOB [birth year]  MRN [id number].  Primary MD  Duanne Limerick, MD   Reason for TCC follow Up - f/u pneumonia/gi bleed   Past Medical History:  Diagnosis Date   Chronic pain    Depression    Diabetes (HCC)    GERD (gastroesophageal reflux disease)    GI bleed 12/2022   Hyperlipemia    Hypertension    MS (multiple sclerosis) (HCC)     Past Surgical History:  Procedure Laterality Date   BIOPSY  05/07/2023   Procedure: BIOPSY;  Surgeon: Regis Bill, MD;  Location: [facility] ENDOSCOPY;  Service: Endoscopy;;   COLECTOMY  06-2008   COLONOSCOPY  2015   normal   COLONOSCOPY WITH PROPOFOL N/A 09/10/2021   Procedure: COLONOSCOPY WITH PROPOFOL;  Surgeon: Midge Minium, MD;  Location: [facility] ENDOSCOPY;  Service: Endoscopy;  Laterality: N/A;   ENTEROSCOPY N/A 05/07/2023   Procedure: ENTEROSCOPY;  Surgeon: Regis Bill, MD;  Location: [facility] ENDOSCOPY;  Service: Endoscopy;  Laterality: N/A;   ESOPHAGOGASTRODUODENOSCOPY (EGD) WITH PROPOFOL N/A 09/10/2021   Procedure: ESOPHAGOGASTRODUODENOSCOPY (EGD) WITH PROPOFOL;  Surgeon: Midge Minium, MD;  Location: [facility] ENDOSCOPY;  Service: Endoscopy;  Laterality: N/A;   GIVENS CAPSULE STUDY N/A 09/25/2021   Procedure: GIVENS CAPSULE STUDY;  Surgeon: Wyline Mood, MD;  Location: [facility] ENDOSCOPY;  Service: Gastroenterology;  Laterality: N/A;   Recent HPI and Hospital course.  Patient is discharged with the primary concerns that we will were noted was acute on chronic respiratory failure with hypoxia with underlying right lower lobe pneumonia treated with azithromycin on discharge which patient has completed.  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  There is been no relating melena nor has had been shortness of breath although we did have difficulty getting a pulse ox greater than 80.  This may be due to-down but we will proceed with further evaluation auscultation was normal  Post Hospital acute  care issue to be addressed in the clinic we will check patient's current CBC to see if hemoglobin is stable as well as there is no leukocytosis.  We will also obtain a portable chest x-ray to make sure there is been no progression of his disease.        Subjective:   [name] today has, No headache, No chest pain, No abdominal pain - No Nausea, No new weakness tingling or numbness, No Cough - SOB. All baseline dyspnea/no evidence of melena or abdominal pain.  Assessment & Plan    There are no diagnoses linked to this encounter.   Reason for frequent admissions/ER visits patient is prone to exacerbation of anemia and due to his anemia of CKD.  Patient also has urinary retention which is to be followed by urology.  Patient does have pulmonary disease which puts him at risk for frequent infections with pneumonia lower respiratory infections.      Objective:  Vitals:   05/27/23 1434  BP: (!) 110/56  Pulse: 70  SpO2: (!) 73%    Wt Readings from Last 3 Encounters:  05/20/23 202 lb 9.6 oz (91.9 kg)  05/03/23 196 lb (88.9 kg)  04/07/23 195 lb 9.6 oz (88.7 kg)    Allergies as of 05/27/2023       Reactions   Betadine [povidone Iodine]         Medication List        Accurate as of May 27, 2023  3:02 PM. If you have any questions, ask your nurse or doctor.          acetaminophen 500 MG tablet Commonly known as: TYLENOL Take 500 mg by mouth every 6 (six) hours as needed for mild pain.   albuterol 108 (90 Base) MCG/ACT inhaler Commonly known as: VENTOLIN HFA Inhale 2 puffs into the lungs in the morning, at noon, in the evening, and at bedtime.   Betaseron 0.3 MG Kit injection Generic drug: Interferon Beta-1b Inject subcutaneously 1 syringe every other day. What changed:  how much to take how to take this when to take this   cyanocobalamin 500 MCG tablet Commonly known as: VITAMIN B12 Take 1,000 mcg by mouth daily.   furosemide 40 MG tablet Commonly  known as: LASIX Take 1 tablet (40 mg total) by mouth every other day. 1/2 pill every other day- Singh   gemfibrozil 600 MG tablet Commonly known as: LOPID TAKE 1 TABLET BY MOUTH TWICE  DAILY BEFORE MEALS   insulin glargine 100 UNIT/ML Solostar Pen Commonly known as: LANTUS Inject 10 Units into the skin daily.   lidocaine 5 % Commonly known as: LIDODERM Place 1 patch onto the skin daily. Remove & Discard patch within 12 hours or as directed by MD   loratadine 10 MG tablet Commonly known as: CLARITIN Take 1 tablet (10 mg total) by mouth daily.   mupirocin ointment 2 % Commonly known as: BACTROBAN Apply 1 Application topically 2 (two) times daily. Apply to effected area bid   nystatin powder Commonly known as: MYCOSTATIN/NYSTOP Apply topically 3 (three) times daily.   omeprazole 40 MG capsule Commonly known as: PRILOSEC Take 1 capsule (40 mg total) by mouth daily.   pregabalin 200 MG capsule Commonly known as: LYRICA Take 1 capsule (200 mg total) by mouth 2 (two) times daily.   sertraline 50 MG tablet Commonly known as: ZOLOFT TAKE ONE-HALF TABLET BY MOUTH  DAILY   simvastatin 40 MG tablet Commonly known as: ZOCOR Take 1 tablet (40 mg total) by mouth daily.   tamsulosin 0.4 MG Caps capsule Commonly known as: FLOMAX Take 1 capsule (0.4 mg total) by mouth daily after supper.   traMADol 50 MG tablet Commonly known as: ULTRAM Take 1 tablet (50 mg total) by mouth every 6 (six) hours as needed.         Physical Exam: Constitutional: Patient appears well-developed and well-nourished. Not in obvious distress. HENT: Normocephalic, atraumatic, External right and left ear normal. Oropharynx is clear and moist.  Eyes: Conjunctivae and EOM are normal. PERRLA, no scleral icterus. Neck: Normal ROM. Neck supple. No JVD. No tracheal deviation. No thyromegaly. CVS: RRR, S1/S2 +, no murmurs, no gallops, no carotid bruit.  Pulmonary: Effort and breath sounds normal, no stridor,  rhonchi, wheezes, rales.  Abdominal: Soft. BS +, no distension, tenderness, rebound or guarding.  Musculoskeletal: Normal range of motion. No edema and no tenderness.  Lymphadenopathy: No lymphadenopathy noted, cervical, inguinal or axillary Neuro:  Alert. Normal reflexes, muscle tone coordination. No cranial nerve deficit. Skin: Skin is warm and dry. No rash noted. Not diaphoretic. No erythema. No pallor. Psychiatric: Normal mood and affect. Behavior, judgment, thought content normal.   Data Review   Micro Results No results found for this or any previous visit (from the past 240 hour(s)).   CBC No results for input(s): "WBC", "HGB", "HCT", "PLT", "MCV", "MCH", "MCHC", "RDW", "LYMPHSABS", "MONOABS", "EOSABS", "BASOSABS", "BANDABS" in the last 168 hours.  Invalid input(s): "NEUTRABS", "BANDSABD"  Chemistries  No results for input(s): "NA", "K", "CL", "CO2", "GLUCOSE", "BUN", "CREATININE", "CALCIUM", "MG", "AST", "ALT", "ALKPHOS", "BILITOT" in the last 168 hours.  Invalid input(s): "GFRCGP" ------------------------------------------------------------------------------------------------------------------ estimated creatinine clearance is 52.9 mL/min (A) (by C-G formula based on SCr of 1.46 mg/dL (H)). ------------------------------------------------------------------------------------------------------------------ No results for input(s): "HGBA1C" in the last 72 hours. ------------------------------------------------------------------------------------------------------------------ No results for input(s): "CHOL", "HDL", "LDLCALC", "TRIG", "CHOLHDL", "LDLDIRECT" in the last 72 hours. ------------------------------------------------------------------------------------------------------------------ No results for input(s): "TSH", "T4TOTAL", "T3FREE", "THYROIDAB" in the last 72 hours.  Invalid input(s):  "FREET3" ------------------------------------------------------------------------------------------------------------------ No results for input(s): "VITAMINB12", "FOLATE", "FERRITIN", "TIBC", "IRON", "RETICCTPCT" in the last 72 hours.  Coagulation profile No results for input(s): "INR", "PROTIME" in the last 168 hours.  No results for input(s): "DDIMER" in the last 72 hours.  Cardiac Enzymes No results for input(s): "CKMB", "TROPONINI", "MYOGLOBIN" in the last 168 hours.  Invalid input(s): "CK" ------------------------------------------------------------------------------------------------------------------ Invalid input(s): "POCBNP" Time spent in minutes 45     Elizabeth Sauer M.D on 05/27/2023 at 3:02 PM   This note may have been dictated with voice recognition software. Similar sounding words can inadvertently be transcribed and this note may contain transcription errors which may not have been corrected upon publication of note.   1. Pneumonia of right lower lobe due to infectious organism New onset.  Recently treated with IV antibiotics and discharged on azithromycin which he has completed.  Patient is not having any dyspnea on exertion cough or fever and chills to suggest recurrence we will obtain a chest x-ray at this point in time to make certain that there is no progression of disease. - DG Chest 2 View  2. Upper GI bleed New onset.  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  As noted above there has been no occurrence of melena as previously noted and we will continue on omeprazole at current dosing.  3. Anemia in stage 3a chronic kidney disease (HCC) Followed by hematology for chronic anemia secondary to CKD and most recently exacerbated by upper GI bleed.  Patient will likely continue with iron infusions as directed by Dr. Cathie Hoops  4. Hospital discharge follow-up Patient continues to  improve overall although pulse ox was not able to get over 80 have a feeling this is due to the patient clamping down with his peripheral circulation.  Will continue to monitor with a low threshold for returning hospitalization if deterioration of overall condition develops   Elizabeth Sauer, MD

## 2023-05-30 ENCOUNTER — Telehealth: Payer: Self-pay | Admitting: Family Medicine

## 2023-05-30 DIAGNOSIS — E1122 Type 2 diabetes mellitus with diabetic chronic kidney disease: Secondary | ICD-10-CM | POA: Diagnosis not present

## 2023-05-30 DIAGNOSIS — J440 Chronic obstructive pulmonary disease with acute lower respiratory infection: Secondary | ICD-10-CM | POA: Diagnosis not present

## 2023-05-30 DIAGNOSIS — J9621 Acute and chronic respiratory failure with hypoxia: Secondary | ICD-10-CM | POA: Diagnosis not present

## 2023-05-30 DIAGNOSIS — I5033 Acute on chronic diastolic (congestive) heart failure: Secondary | ICD-10-CM | POA: Diagnosis not present

## 2023-05-30 DIAGNOSIS — J188 Other pneumonia, unspecified organism: Secondary | ICD-10-CM | POA: Diagnosis not present

## 2023-05-30 DIAGNOSIS — I130 Hypertensive heart and chronic kidney disease with heart failure and stage 1 through stage 4 chronic kidney disease, or unspecified chronic kidney disease: Secondary | ICD-10-CM | POA: Diagnosis not present

## 2023-05-30 NOTE — Telephone Encounter (Signed)
Home Health Verbal Orders - Caller/Agency: cindy from [facility]  Callback Number: [phone] Requesting PT Frequency: 1x1 2x4 1x4

## 2023-05-31 ENCOUNTER — Ambulatory Visit: Payer: Self-pay | Admitting: *Deleted

## 2023-05-31 ENCOUNTER — Telehealth: Payer: Self-pay

## 2023-05-31 DIAGNOSIS — J9621 Acute and chronic respiratory failure with hypoxia: Secondary | ICD-10-CM | POA: Diagnosis not present

## 2023-05-31 DIAGNOSIS — E1122 Type 2 diabetes mellitus with diabetic chronic kidney disease: Secondary | ICD-10-CM | POA: Diagnosis not present

## 2023-05-31 DIAGNOSIS — J188 Other pneumonia, unspecified organism: Secondary | ICD-10-CM | POA: Diagnosis not present

## 2023-05-31 DIAGNOSIS — J440 Chronic obstructive pulmonary disease with acute lower respiratory infection: Secondary | ICD-10-CM | POA: Diagnosis not present

## 2023-05-31 DIAGNOSIS — I5033 Acute on chronic diastolic (congestive) heart failure: Secondary | ICD-10-CM | POA: Diagnosis not present

## 2023-05-31 DIAGNOSIS — I130 Hypertensive heart and chronic kidney disease with heart failure and stage 1 through stage 4 chronic kidney disease, or unspecified chronic kidney disease: Secondary | ICD-10-CM | POA: Diagnosis not present

## 2023-05-31 NOTE — Telephone Encounter (Signed)
Message from [place] [facility] sent at 05/31/2023  9:29 AM EDT  Summary: medication request   Patients spouse called stated her husband coughed all night last night and was still coughing this morning. She is requesting that provider call him in a cough medication. Please f/u with wife          Call History  Contact Date/Time Type Contact Phone/Fax User  05/31/2023 09:28 AM EDT Phone (Incoming) [facility] (Emergency Contact) [phone] Judie Petit) [place] Jester   Reason for Disposition  [1] Continuous (nonstop) coughing interferes with work or school AND [2] no improvement using cough treatment per Care Advice    In hospital recently for pneumonia.  Answer Assessment - Initial Assessment Questions 1. ONSET: "When did the cough begin?"      He had a chest x ray on Friday but we have not heard the results.    I returned the call to his wife Jeremy [name].   She called in asking for cough medication because he coughed all night last night.  A dry cough.   He has been coughing once in a while but last night it was all night and worse than it's been. 2. SEVERITY: "How bad is the cough today?"      Last night the cough started real bad.  It's a dry cough.   3. SPUTUM: "Describe the color of your sputum" (none, dry cough; clear, white, yellow, green)     No 4. HEMOPTYSIS: "Are you coughing up any blood?" If so ask: "How much?" (flecks, streaks, tablespoons, etc.)     Not asked  5. DIFFICULTY BREATHING: "Are you having difficulty breathing?" If Yes, ask: "How bad is it?" (e.g., mild, moderate, severe)    - MILD: No SOB at rest, mild SOB with walking, speaks normally in sentences, can lie down, no retractions, pulse < 100.    - MODERATE: SOB at rest, SOB with minimal exertion and prefers to sit, cannot lie down flat, speaks in phrases, mild retractions, audible wheezing, pulse 100-120.    - SEVERE: Very SOB at rest, speaks in single words, struggling to breathe, sitting hunched forward, retractions, pulse  > 120      No shortness of breath. 6. FEVER: "Do you have a fever?" If Yes, ask: "What is your temperature, how was it measured, and when did it start?"     No   7. CARDIAC HISTORY: "Do you have any history of heart disease?" (e.g., heart attack, congestive heart failure)      Not asked 8. LUNG HISTORY: "Do you have any history of lung disease?"  (e.g., pulmonary embolus, asthma, emphysema)     Was in hospital recently for pneumonia 9. PE RISK FACTORS: "Do you have a history of blood clots?" (or: recent major surgery, recent prolonged travel, bedridden)     Not asked 10. OTHER SYMPTOMS: "Do you have any other symptoms?" (e.g., runny nose, wheezing, chest pain)       No other symptoms like a runny nose, no sore throat, or fever. 1. PREGNANCY: "Is there any chance you are pregnant?" "When was your last menstrual period?"       N/A 12. TRAVEL: "Have you traveled out of the country in the last month?" (e.g., travel history, exposures)       N/A  Protocols used: Cough - Acute Non-Productive-A-AH

## 2023-05-31 NOTE — Telephone Encounter (Signed)
Called and left message to proceed with PT/ Arna Medici [facility]

## 2023-05-31 NOTE — Telephone Encounter (Signed)
  Chief Complaint: A dry cough all night Symptoms: started having a dry cough that he had all night last night They also have not heard anything about the chest x ray result. Frequency: All night last night Pertinent Negatives: Patient denies coughing up anything, no fever, no runny nose or sore throat. Disposition: [] ED /[] Urgent Care (no appt availability in office) / [] Appointment(In office/virtual)/ []  [facility]/ [] Home Care/ [] Refused Recommended Disposition /[] [place] [facility]/ [x]  Follow-up with PCP Additional Notes: Message sent to Dr. Yetta Barre.   If she is willing to call in the cough medication please send to [facility].   Wife also inquiring about chest x ray report.

## 2023-05-31 NOTE — Telephone Encounter (Signed)
Summary: cough / rx req   Per agent: "The patient's wife has called to share that the patient has experienced significant coughing and discomfort  The patient was unable to sleep due to their coughing last night  The patient would like to be prescribed something to help with their cough and sleep difficulty  Please contact further when possible     Chief Complaint: Cough Symptoms: Mostly dry cough, occasional yellowish phlegm. Kept awake all night Frequency: last night Pertinent Negatives: Patient denies fever Disposition: [] ED /[] Urgent Care (no appt availability in office) / [] Appointment(In office/virtual)/ []  [facility]/ [] Home Care/ [x] Refused Recommended Disposition /[] [facility]/ []  Follow-up with PCP Additional Notes: Pt's wife calling, spoke to pt as well. States pt is no more SOB than usual.O2 sat 92% on 4ls O2. Wife  states that is what he usually runs.   Advised appt. Wife states will try OTC meds first. Care advise provided, verbalizes understanding. Also requested CXR results, not released, please advise.  Reason for Disposition  [1] Continuous (nonstop) coughing interferes with work or school AND [2] no improvement using cough treatment per Care Advice  Answer Assessment - Initial Assessment Questions 1. ONSET: "When did the cough begin?"      Last night 2. SEVERITY: "How bad is the cough today?"      Awake all night 3. SPUTUM: "Describe the color of your sputum" (none, dry cough; clear, white, yellow, green)     yellowish 4. HEMOPTYSIS: "Are you coughing up any blood?" If so ask: "How much?" (flecks, streaks, tablespoons, etc.)     No 5. DIFFICULTY BREATHING: "Are you having difficulty breathing?" If Yes, ask: "How bad is it?" (e.g., mild, moderate, severe)    - MILD: No SOB at rest, mild SOB with walking, speaks normally in sentences, can lie down, no retractions, pulse < 100.    - MODERATE: SOB at rest, SOB with minimal exertion and prefers  to sit, cannot lie down flat, speaks in phrases, mild retractions, audible wheezing, pulse 100-120.    - SEVERE: Very SOB at rest, speaks in single words, struggling to breathe, sitting hunched forward, retractions, pulse > 120      No 6. FEVER: "Do you have a fever?" If Yes, ask: "What is your temperature, how was it measured, and when did it start?"     No 7. CARDIAC HISTORY: "Do you have any history of heart disease?" (e.g., heart attack, congestive heart failure)       8. LUNG HISTORY: "Do you have any history of lung disease?"  (e.g., pulmonary embolus, asthma, emphysema)      9. PE RISK FACTORS: "Do you have a history of blood clots?" (or: recent major surgery, recent prolonged travel, bedridden)      10. OTHER SYMPTOMS: "Do you have any other symptoms?" (e.g., runny nose, wheezing, chest pain)       Stuffy nose, wears O2,  Protocols used: Cough - Acute Non-Productive-A-AH

## 2023-06-01 ENCOUNTER — Telehealth: Payer: Self-pay | Admitting: *Deleted

## 2023-06-01 ENCOUNTER — Telehealth: Payer: Self-pay

## 2023-06-01 NOTE — Telephone Encounter (Signed)
I returned wife's call. Radiology bumped xray to STAT so we will get report back. Wife stated he had a good night last night without coughing and he stated he felt fine this morning. Hold off on cough med for now and advised wife to pick up some mucinex D to help him get it out.

## 2023-06-01 NOTE — Patient Outreach (Signed)
  Care Coordination   Follow Up Visit Note   06/02/2023 Name: Jeremy [name] MRN: [id number] DOB: Feb 17, 1951  Jeremy [name] is a 72 y.o. year old male who sees Duanne Limerick, MD for primary care. I spoke with wife of Jeremy [name] by phone today.  What matters to the patients health and wellness today?  Healing of sacral wounds, getting HHRN to come to home to evaluate     Goals Addressed             This Visit's Progress    Effective management of MS   On track      Interventions Today    Flowsheet Row Most Recent Value  Chronic Disease   Chronic disease during today's visit Other  [weakness, MS, recent hospitalization]  General Interventions   General Interventions Discussed/Reviewed General Interventions Reviewed, Doctor Visits, Communication with  Doctor Visits Discussed/Reviewed Doctor Visits Reviewed, PCP, Specialist  [cancer center 8/12, urology tomorrow. Also has visit from [facility] for OT and PT, but requesting RN for pressure sores on bottom]  PCP/Specialist Visits Compliance with follow-up visit  Communication with --  Jeanene Erb Adoration regarding request for RN, will need order from PCP office to be faxed.  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  Skin tears healing, with exception of sacral wounds.  Blood pressure has been "good" with HH recording]              SDOH assessments and interventions completed:  No     Care Coordination Interventions:  Yes, provided   Follow up plan: Follow up call scheduled for 8/14    Encounter Outcome:  Pt. Visit Completed   Kemper Durie, RN, MSN, [facility] Care Management Care Management  Coordinator [phone]

## 2023-06-02 ENCOUNTER — Ambulatory Visit: Payer: Self-pay | Admitting: *Deleted

## 2023-06-02 ENCOUNTER — Telehealth: Payer: Self-pay | Admitting: Family Medicine

## 2023-06-02 ENCOUNTER — Ambulatory Visit: Payer: Medicare Other | Admitting: Urology

## 2023-06-02 ENCOUNTER — Ambulatory Visit: Payer: Medicare Other | Admitting: Physician Assistant

## 2023-06-02 ENCOUNTER — Other Ambulatory Visit: Payer: Self-pay

## 2023-06-02 DIAGNOSIS — J189 Pneumonia, unspecified organism: Secondary | ICD-10-CM

## 2023-06-02 MED ORDER — PROMETHAZINE-DM 6.25-15 MG/5ML PO SYRP
5.0000 mL | ORAL_SOLUTION | Freq: Four times a day (QID) | ORAL | 0 refills | Status: DC | PRN
Start: 2023-06-02 — End: 2023-07-11

## 2023-06-02 MED ORDER — AZITHROMYCIN 250 MG PO TABS
ORAL_TABLET | ORAL | 0 refills | Status: AC
Start: 2023-06-02 — End: 2023-06-07

## 2023-06-02 NOTE — Telephone Encounter (Signed)
Summary: medication request   Patients wife called stated patient started back coughing again last night and test results showed he has pnuemonia. Wife said provider was suppose to prescribe an antibiotic and she is wanting to know when that will happen. Please f/u with patient     X ray impression:  Chest x-ray notes bronchitis with a right lower lobe pneumonia.  Check on status of patient continue antibiotic to completion.  Reason for Disposition  Prescription request for new medicine (not a refill)  Answer Assessment - Initial Assessment Questions 1. NAME of MEDICINE: "What medicine(s) are you calling about?"     Antibiotic request- patient has not ben on antibiotic since in hospital 2. QUESTION: "What is your question?" (e.g., double dose of medicine, side effect)     Patient received X ray report and shows pneumonia- patient is presently not on antibiotic.  3. PRESCRIBER: "Who prescribed the medicine?" Reason: if prescribed by specialist, call should be referred to that group.     PCP 4. SYMPTOMS: "Do you have any symptoms?" If Yes, ask: "What symptoms are you having?"  "How bad are the symptoms (e.g., mild, moderate, severe) Cough started last night, nasal congestion- thick yellow  ([facility])  Protocols used: Medication Question Call-A-AH

## 2023-06-02 NOTE — Telephone Encounter (Signed)
  Chief Complaint: Rx request- see X ray result Symptoms: cough, thick yellow nasal congestion Frequency: na  Disposition: [] ED /[] Urgent Care (no appt availability in office) / [] Appointment(In office/virtual)/ []  [facility]/ [] Home Care/ [] Refused Recommended Disposition /[] [facility]/ [x]  Follow-up with PCP Additional Notes: Patient is requesting antibiotis- has not had tretament since patient was in hospital.

## 2023-06-02 NOTE — Telephone Encounter (Signed)
Copied from CRM [id number]. Topic: General - Other >> Jun 01, 2023  5:08 PM Everette C wrote: Reason for CRM: The patient's wife would like to be contacted to review recent xray results and further discuss the patient's care  Please contact further when possible

## 2023-06-02 NOTE — Progress Notes (Unsigned)
Sent in cough syrup and antibiotic

## 2023-06-06 ENCOUNTER — Inpatient Hospital Stay: Payer: Medicare Other

## 2023-06-06 ENCOUNTER — Encounter: Payer: Self-pay | Admitting: Oncology

## 2023-06-06 ENCOUNTER — Inpatient Hospital Stay: Payer: Medicare Other | Attending: Oncology

## 2023-06-06 ENCOUNTER — Inpatient Hospital Stay (HOSPITAL_BASED_OUTPATIENT_CLINIC_OR_DEPARTMENT_OTHER): Payer: Medicare Other | Admitting: Oncology

## 2023-06-06 ENCOUNTER — Telehealth: Payer: Self-pay | Admitting: Family Medicine

## 2023-06-06 VITALS — BP 144/49 | HR 77 | Temp 96.7°F | Resp 18

## 2023-06-06 DIAGNOSIS — Q2730 Arteriovenous malformation, site unspecified: Secondary | ICD-10-CM

## 2023-06-06 DIAGNOSIS — D500 Iron deficiency anemia secondary to blood loss (chronic): Secondary | ICD-10-CM

## 2023-06-06 DIAGNOSIS — D631 Anemia in chronic kidney disease: Secondary | ICD-10-CM | POA: Diagnosis not present

## 2023-06-06 DIAGNOSIS — E611 Iron deficiency: Secondary | ICD-10-CM | POA: Insufficient documentation

## 2023-06-06 DIAGNOSIS — D649 Anemia, unspecified: Secondary | ICD-10-CM

## 2023-06-06 DIAGNOSIS — N189 Chronic kidney disease, unspecified: Secondary | ICD-10-CM | POA: Diagnosis not present

## 2023-06-06 DIAGNOSIS — N1831 Chronic kidney disease, stage 3a: Secondary | ICD-10-CM | POA: Diagnosis not present

## 2023-06-06 LAB — RETIC PANEL
Immature Retic Fract: 15.3 % (ref 2.3–15.9)
RBC.: 3.22 MIL/uL — ABNORMAL LOW (ref 4.22–5.81)
Retic Count, Absolute: 96.3 10*3/uL (ref 19.0–186.0)
Retic Ct Pct: 3 % (ref 0.4–3.1)
Reticulocyte Hemoglobin: 30.5 pg (ref >27.9)

## 2023-06-06 LAB — CBC WITH DIFFERENTIAL (CANCER CENTER ONLY)
Abs Immature Granulocytes: 0.03 10*3/uL (ref 0.00–0.07)
Basophils Absolute: 0.1 10*3/uL (ref 0.0–0.1)
Basophils Relative: 1 %
Eosinophils Absolute: 0.3 10*3/uL (ref 0.0–0.5)
Eosinophils Relative: 6 %
HCT: 29 % — ABNORMAL LOW (ref 39.0–52.0)
Hemoglobin: 9.2 g/dL — ABNORMAL LOW (ref 13.0–17.0)
Immature Granulocytes: 1 %
Lymphocytes Relative: 9 %
Lymphs Abs: 0.6 10*3/uL — ABNORMAL LOW (ref 0.7–4.0)
MCH: 28.8 pg (ref 26.0–34.0)
MCHC: 31.7 g/dL (ref 30.0–36.0)
MCV: 90.6 fL (ref 80.0–100.0)
Monocytes Absolute: 0.6 10*3/uL (ref 0.1–1.0)
Monocytes Relative: 10 %
Neutro Abs: 4.4 10*3/uL (ref 1.7–7.7)
Neutrophils Relative %: 73 %
Platelet Count: 255 10*3/uL (ref 150–400)
RBC: 3.2 MIL/uL — ABNORMAL LOW (ref 4.22–5.81)
RDW: 17.9 % — ABNORMAL HIGH (ref 11.5–15.5)
WBC Count: 5.9 10*3/uL (ref 4.0–10.5)
nRBC: 0 % (ref 0.0–0.2)

## 2023-06-06 LAB — IRON AND TIBC
Iron: 30 ug/dL — ABNORMAL LOW (ref 45–182)
Saturation Ratios: 12 % — ABNORMAL LOW (ref 17.9–39.5)
TIBC: 253 ug/dL (ref 250–450)
UIBC: 223 ug/dL

## 2023-06-06 LAB — FERRITIN: Ferritin: 263 ng/mL (ref 24–336)

## 2023-06-06 MED ORDER — EPOETIN ALFA-EPBX 40000 UNIT/ML IJ SOLN
40000.0000 [IU] | Freq: Once | INTRAMUSCULAR | Status: AC
  Administered 2023-06-06: 40000 [IU] via SUBCUTANEOUS
  Filled 2023-06-06: qty 1

## 2023-06-06 NOTE — Progress Notes (Signed)
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 Labs reviewed and discussed with patient. Lab Results  Component Value Date   HGB 9.2 (L) 06/06/2023   TIBC 253 06/06/2023   IRONPCTSAT 12 (L) 06/06/2023   FERRITIN 263 06/06/2023   Recommend IV Venofer x 1     Anemia in chronic kidney disease (CKD) Retacrit 40,000 units x 1. Repeat labs every 2 weeks +/- Retacrit if hb<10  Arteriovenous malformation (AVM) Continues to have dark stool, indicates ongoing blood loss.  Recommend patient to continue follow up with GI   Orders Placed This Encounter  Procedures   CBC with Differential (Cancer Center Only)    Standing Status:   Future    Standing Expiration Date:   06/05/2024   Ferritin    Standing Status:   Future    Standing Expiration Date:   06/05/2024   Iron and TIBC    Standing Status:   Future    Standing Expiration Date:   06/05/2024   CBC with Differential (Cancer Center Only)    Standing Status:   Future    Standing Expiration Date:   06/05/2024   Sample to Blood Bank    Standing Status:   Future    Standing Expiration Date:   06/05/2024   Sample to Blood Bank    Standing Status:   Future    Standing Expiration Date:   06/05/2024   Follow-up  H&H in 2 weeks +/- Retacrit  4 weeks lab MD +/- retacrit   All questions were answered. The patient knows to call the clinic with any problems, questions or concerns.  Rickard Patience, MD, PhD [facility] Hematology Oncology 06/06/2023     PERTINENT HEMATOLOGY HISTORY  Jeremy [name] is a 72 y.o. male who has above history reviewed by me today presents for follow up visit for anemia 09/24/2021,-09/27/2021, patient had hemoglobin dropped to 5.3 and was advised  to go to emergency room be admitted.  Patient received 2 units of PRBC transfusion IV Venofer.    Endoscopy showed gastritis.  Small capsule study showed small bowel polyp.  His hemoglobin improved to 8.2 and patient was discharged.  #12/04/2021 Small Bowel Capsule Study Showed Nonbleeding AVM, esophagitis, gastritis, poor prep.  Small bowel polyp, area of erythema in colon obscured by poor prep.  # Admitted from 10/16/22- 10/17/22. He received PRBC transfusions, Hb improved at discharged to 7.1 S/p enteroscopy on 01/10/2023 at [facility] Ileal polyp removed, pathology showed Small intestinal mucosa with benign proliferation of capillary vessels WT1 immunostain is negative. The morphology and immunophenotype are compatible with patient's known history of venous malformation  INTERVAL HISTORY Jeremy [name] is a 72 y.o. male who has above history reviewed by me today presents for follow up visit for management of iron deficiency anemia. Today patient reports feeling better today.  Denies hematochezia, hematuria, hematemesis, epistaxis, black tarry stool or easy bruising.      Review of Systems  Constitutional:  Positive for fatigue. Negative for appetite change, chills and fever.  HENT:   Negative for hearing loss and voice change.   Eyes:  Negative for eye problems and icterus.  Respiratory:  Positive for shortness of breath. Negative for chest tightness and cough.   Cardiovascular:  Negative for chest pain and leg swelling.  Gastrointestinal:  Negative for abdominal distention and abdominal pain.  Endocrine: Negative for hot flashes.  Genitourinary:  Negative for difficulty urinating, dysuria and frequency.   Musculoskeletal:  Negative for arthralgias.  Skin:  Negative for itching and rash.  Neurological:  Negative for light-headedness and numbness.  Hematological:  Negative for adenopathy. Does not bruise/bleed easily.  Psychiatric/Behavioral:  Negative for confusion.     MEDICAL HISTORY:   Past Medical History:  Diagnosis Date   Chronic pain    Depression    Diabetes (HCC)    GERD (gastroesophageal reflux disease)    GI bleed 12/2022   Hyperlipemia    Hypertension    MS (multiple sclerosis) (HCC)     SURGICAL HISTORY: Past Surgical History:  Procedure Laterality Date   BIOPSY  05/07/2023   Procedure: BIOPSY;  Surgeon: Regis Bill, MD;  Location: [facility] ENDOSCOPY;  Service: Endoscopy;;   COLECTOMY  06-2008   COLONOSCOPY  2015   normal   COLONOSCOPY WITH PROPOFOL N/A 09/10/2021   Procedure: COLONOSCOPY WITH PROPOFOL;  Surgeon: Midge Minium, MD;  Location: [facility] ENDOSCOPY;  Service: Endoscopy;  Laterality: N/A;   ENTEROSCOPY N/A 05/07/2023   Procedure: ENTEROSCOPY;  Surgeon: Regis Bill, MD;  Location: [facility] ENDOSCOPY;  Service: Endoscopy;  Laterality: N/A;   ESOPHAGOGASTRODUODENOSCOPY (EGD) WITH PROPOFOL N/A 09/10/2021   Procedure: ESOPHAGOGASTRODUODENOSCOPY (EGD) WITH PROPOFOL;  Surgeon: Midge Minium, MD;  Location: [facility] ENDOSCOPY;  Service: Endoscopy;  Laterality: N/A;   GIVENS CAPSULE STUDY N/A 09/25/2021   Procedure: GIVENS CAPSULE STUDY;  Surgeon: Wyline Mood, MD;  Location: [facility] ENDOSCOPY;  Service: Gastroenterology;  Laterality: N/A;    SOCIAL HISTORY: Social History   Socioeconomic History   Marital status: Married    Spouse name: Olegario Messier   Number of children: 2   Years of education: GED   Highest education level: Not on file  Occupational History    Comment: Disabled  Tobacco Use   Smoking status: Former    Current packs/day: 0.00    Average packs/day: 1.5 packs/day for 30.0 years (45.0 ttl pk-yrs)    Types: Cigarettes    Start date: 01/24/1976    Quit date: 01/23/2006    Years since quitting: 17.3   Smokeless tobacco: Never   Tobacco comments:    N/A  Vaping Use   Vaping status: Never Used  Substance and Sexual Activity   Alcohol use: Not Currently    Comment: rare; maybe 2 beers a year   Drug use: No   Sexual activity: Not  Currently  Other Topics Concern   Not on file  Social History Narrative   Patient is disabled.    Patient lives with his wife Jibrael Hartfield.    Patient has 2 children.       Social Determinants of Health   Financial Resource Strain: Low Risk  (11/19/2022)   Overall Financial Resource Strain (CARDIA)    Difficulty of Paying Living Expenses: Not hard at all  Food Insecurity: No Food Insecurity (05/23/2023)   Hunger Vital Sign    Worried About Running Out of Food in the Last Year: Never true    Ran Out of Food in the Last Year: Never true  Transportation Needs: No Transportation Needs (05/23/2023)   PRAPARE - Administrator, Civil Service (Medical): No    Lack of Transportation (Non-Medical): No  Physical Activity: Insufficiently Active (11/19/2022)   Exercise Vital Sign    Days of Exercise per  Week: 3 days    Minutes of Exercise per Session: 20 min  Stress: No Stress Concern Present (11/19/2022)   [employer] of Occupational Health - Occupational Stress Questionnaire    Feeling of Stress : Not at all  Social Connections: Moderately Isolated (11/19/2022)   Social Connection and Isolation Panel [NHANES]    Frequency of Communication with Friends and Family: More than three times a week    Frequency of Social Gatherings with Friends and Family: More than three times a week    Attends Religious Services: Never    Database administrator or Organizations: No    Attends Banker Meetings: Never    Marital Status: Married  Catering manager Violence: Not At Risk (05/07/2023)   Humiliation, Afraid, Rape, and Kick questionnaire    Fear of Current or Ex-Partner: No    Emotionally Abused: No    Physically Abused: No    Sexually Abused: No    FAMILY HISTORY: Family History  Problem Relation Age of Onset   Lung cancer Mother    Heart attack Father    Heart attack Brother    COPD Brother    Diabetes Brother    Esophageal cancer Nephew     ALLERGIES:  is  allergic to betadine [povidone iodine].  MEDICATIONS:  Current Outpatient Medications  Medication Sig Dispense Refill   acetaminophen (TYLENOL) 500 MG tablet Take 500 mg by mouth every 6 (six) hours as needed for mild pain.     albuterol (VENTOLIN HFA) 108 (90 Base) MCG/ACT inhaler Inhale 2 puffs into the lungs in the morning, at noon, in the evening, and at bedtime.     cyanocobalamin (VITAMIN B12) 500 MCG tablet Take 1,000 mcg by mouth daily.     furosemide (LASIX) 40 MG tablet Take 1 tablet (40 mg total) by mouth every other day. 1/2 pill every other day- Singh 30 tablet 0   gemfibrozil (LOPID) 600 MG tablet TAKE 1 TABLET BY MOUTH TWICE  DAILY BEFORE MEALS 180 tablet 0   insulin glargine (LANTUS) 100 UNIT/ML Solostar Pen Inject 10 Units into the skin daily.     Interferon Beta-1b (BETASERON) 0.3 MG KIT injection Inject subcutaneously 1 syringe every other day. (Patient taking differently: Inject 0.25 mg into the skin every other day. Inject subcutaneously 1 syringe every other day.) 45 kit 4   lidocaine (LIDODERM) 5 % Place 1 patch onto the skin daily. Remove & Discard patch within 12 hours or as directed by MD 30 patch 0   loratadine (CLARITIN) 10 MG tablet Take 1 tablet (10 mg total) by mouth daily. 90 tablet 1   mupirocin ointment (BACTROBAN) 2 % Apply 1 Application topically 2 (two) times daily. Apply to effected area bid 30 g 1   nystatin (MYCOSTATIN/NYSTOP) powder Apply topically 3 (three) times daily. 15 g 0   omeprazole (PRILOSEC) 40 MG capsule Take 1 capsule (40 mg total) by mouth daily. 90 capsule 0   pregabalin (LYRICA) 200 MG capsule Take 1 capsule (200 mg total) by mouth 2 (two) times daily. 60 capsule 5   promethazine-dextromethorphan (PROMETHAZINE-DM) 6.25-15 MG/5ML syrup Take 5 mLs by mouth 4 (four) times daily as needed for cough. 118 mL 0   sertraline (ZOLOFT) 50 MG tablet TAKE ONE-HALF TABLET BY MOUTH  DAILY 45 tablet 0   simvastatin (ZOCOR) 40 MG tablet Take 1 tablet (40 mg  total) by mouth daily. 90 tablet 1   tamsulosin (FLOMAX) 0.4 MG CAPS capsule Take 1 capsule (  0.4 mg total) by mouth daily after supper. 30 capsule 1   traMADol (ULTRAM) 50 MG tablet Take 1 tablet (50 mg total) by mouth every 6 (six) hours as needed. 120 tablet 2   azithromycin (ZITHROMAX) 250 MG tablet Take 2 tablets on day 1, then 1 tablet daily on days 2 through 5 (Patient not taking: Reported on 06/06/2023) 6 tablet 0   No current facility-administered medications for this visit.   Facility-Administered Medications Ordered in Other Visits  Medication Dose Route Frequency Provider Last Rate Last Admin   0.9 %  sodium chloride infusion   Intravenous Continuous Rickard Patience, MD   Stopped at 11/04/22 1432   diphenhydrAMINE (BENADRYL) capsule 25 mg  25 mg Oral Once Rickard Patience, MD         PHYSICAL EXAMINATION: ECOG PERFORMANCE STATUS: 2 - Symptomatic, <50% confined to bed Vitals:   06/06/23 1332  BP: (!) 144/49  Pulse: 77  Resp: 18  Temp: (!) 96.7 F (35.9 C)  SpO2: (!) 84%   There were no vitals filed for this visit.   Physical Exam Constitutional:      General: He is not in acute distress. HENT:     Head: Normocephalic and atraumatic.  Eyes:     General: No scleral icterus. Cardiovascular:     Rate and Rhythm: Normal rate and regular rhythm.     Heart sounds: Normal heart sounds.  Pulmonary:     Effort: Pulmonary effort is normal. No respiratory distress.     Comments: Decreased breath sound bilaterally.   Abdominal:     General: Bowel sounds are normal. There is no distension.     Palpations: Abdomen is soft.  Musculoskeletal:        General: No deformity. Normal range of motion.     Cervical back: Normal range of motion and neck supple.  Skin:    General: Skin is warm.     Findings: No rash.  Neurological:     Mental Status: He is alert and oriented to person, place, and time. Mental status is at baseline.     Cranial Nerves: No cranial nerve deficit.  Psychiatric:         Mood and Affect: Mood normal.     LABORATORY DATA:  I have reviewed the data as listed     Latest Ref Rng & Units 06/06/2023   12:56 PM 05/27/2023    3:40 PM 05/17/2023    3:02 AM  CBC  WBC 4.0 - 10.5 K/uL 5.9  6.1    Hemoglobin 13.0 - 17.0 g/dL 9.2  16.1  8.6   Hematocrit 39.0 - 52.0 % 29.0  33.9    Platelets 150 - 400 K/uL 255  226        Latest Ref Rng & Units 05/19/2023    3:24 AM 05/17/2023    3:02 AM 05/16/2023    7:45 AM  CMP  Glucose 70 - 99 mg/dL 89  79  74   BUN 8 - 23 mg/dL 33  32  28   Creatinine 0.61 - 1.24 mg/dL 0.96  0.45  4.09   Sodium 135 - 145 mmol/L 135  133  134   Potassium 3.5 - 5.1 mmol/L 4.5  4.8  4.9   Chloride 98 - 111 mmol/L 104  103  103   CO2 22 - 32 mmol/L 26  25  24    Calcium 8.9 - 10.3 mg/dL 7.9  7.6  7.9     Iron/TIBC/Ferritin/ %  Sat    Component Value Date/Time   IRON 30 (L) 06/06/2023 1256   IRON 63 (L) 10/10/2013 1130   TIBC 253 06/06/2023 1256   TIBC 416 10/10/2013 1130   FERRITIN 263 06/06/2023 1256   FERRITIN 90 10/10/2013 1130   IRONPCTSAT 12 (L) 06/06/2023 1256   IRONPCTSAT 15 10/10/2013 1130       RADIOGRAPHIC STUDIES: I have personally reviewed the radiological images as listed and agreed with the findings in the report. DG Chest 2 View  Result Date: 06/01/2023 CLINICAL DATA:  72 year old male with history of pneumonia. Follow-up study. EXAM: CHEST - 2 VIEW COMPARISON:  Chest x-ray 05/11/2023. FINDINGS: Diffuse interstitial prominence and widespread peribronchial cuffing. Ill-defined opacity at the right base which may reflect atelectasis and/or consolidation, with superimposed small right pleural effusion. Left lung appears clear. Ill-defined nodular density in the lateral aspect of the right mid lung estimated measure proximally 1.3 cm in diameter, similar to several prior examinations (previously negative on PET-CT 11/30/2021, presumably a benign lesion such as a hamartoma), no imaging follow-up recommended. IMPRESSION: 1.  The appearance of the chest suggests bronchitis with right lower lobe pneumonia and small parapneumonic pleural effusion. Followup PA and lateral chest X-ray is recommended in 3-4 weeks following trial of antibiotic therapy to ensure resolution and exclude underlying malignancy. 2. Aortic atherosclerosis. Electronically Signed   By: Trudie Reed M.D.   On: 06/01/2023 08:00   DG Chest Right Decubitus  Result Date: 05/15/2023 CLINICAL DATA:  Hypoxia EXAM: CHEST - RIGHT DECUBITUS COMPARISON:  05/11/2023 two-view chest x-ray FINDINGS: Right-side-down decubitus film shows a layering small right pleural effusion. Right base collapse/consolidation again noted. Interstitial markings are diffusely coarsened with chronic features. The cardio pericardial silhouette is enlarged. Bones are diffusely demineralized. IMPRESSION: Small layering right pleural effusion. Electronically Signed   By: Kennith Center M.D.   On: 05/15/2023 12:43   DG Chest 2 View  Result Date: 05/11/2023 CLINICAL DATA:  Hypoxia, shortness of breath EXAM: CHEST - 2 VIEW COMPARISON:  Previous studies including the examination done on 05/08/2023 FINDINGS: Cardiac size is within normal limits. There is opacification of right lower lung field suggesting right pleural effusion and underlying atelectasis/pneumonia. There is interval worsening. There is faint 1.2 cm nodular density in right upper lung field with no significant change. In previous PET-CT done on 11/30/2021, levels no hypermetabolic activity in the nodule. Small linear density in left lower lung field may suggest minimal subsegmental atelectasis. Left lateral CP angle is clear. There is no pneumothorax. IMPRESSION: There is increased opacification in right lower lung fields suggesting increase in right pleural effusion and possibly worsening of underlying atelectasis/pneumonia. There is faint nodular density in right upper lung field with no significant change. Electronically Signed   By:  Ernie Avena M.D.   On: 05/11/2023 14:23   ECHOCARDIOGRAM COMPLETE  Result Date: 05/09/2023    ECHOCARDIOGRAM REPORT   Patient Name:   TRAMOND MAROTTE Date of Exam: 05/09/2023 Medical Rec #:  [id number]     Height:       70.0 in Accession #:    [id number]    Weight:       196.0 lb Date of Birth:  06-11-1951     BSA:          2.069 m Patient Age:    72 years      BP:           128/47 mmHg Patient Gender: M  HR:           70 bpm. Exam Location:  [facility] Procedure: 2D Echo, Cardiac Doppler and Color Doppler Indications:     CHF---acute diastolic I50.31  History:         Patient has no prior history of Echocardiogram examinations.                  Risk Factors:Diabetes, Hypertension and Dyslipidemia.  Sonographer:     Cristela Blue Referring Phys:  [id number] Marrion Coy Diagnosing Phys: Lorine Bears MD  Sonographer Comments: Suboptimal apical window. IMPRESSIONS  1. Left ventricular ejection fraction, by estimation, is 55 to 60%. The left ventricle has normal function. The left ventricle has no regional wall motion abnormalities. Left ventricular diastolic parameters were normal.  2. Right ventricular systolic function is normal. The right ventricular size is normal. There is moderately elevated pulmonary artery systolic pressure.  3. The mitral valve is normal in structure. No evidence of mitral valve regurgitation. No evidence of mitral stenosis.  4. The aortic valve is normal in structure. Aortic valve regurgitation is not visualized. Aortic valve sclerosis is present, with no evidence of aortic valve stenosis. FINDINGS  Left Ventricle: Left ventricular ejection fraction, by estimation, is 55 to 60%. The left ventricle has normal function. The left ventricle has no regional wall motion abnormalities. The left ventricular internal cavity size was normal in size. There is  no left ventricular hypertrophy. Left ventricular diastolic parameters were normal. Right Ventricle: The right ventricular size is  normal. No increase in right ventricular wall thickness. Right ventricular systolic function is normal. There is moderately elevated pulmonary artery systolic pressure. The tricuspid regurgitant velocity is 3.27 m/s, and with an assumed right atrial pressure of 5 mmHg, the estimated right ventricular systolic pressure is 47.8 mmHg. Left Atrium: Left atrial size was normal in size. Right Atrium: Right atrial size was normal in size. Pericardium: There is no evidence of pericardial effusion. Mitral Valve: The mitral valve is normal in structure. No evidence of mitral valve regurgitation. No evidence of mitral valve stenosis. Tricuspid Valve: The tricuspid valve is normal in structure. Tricuspid valve regurgitation is mild . No evidence of tricuspid stenosis. Aortic Valve: The aortic valve is normal in structure. Aortic valve regurgitation is not visualized. Aortic valve sclerosis is present, with no evidence of aortic valve stenosis. Aortic valve mean gradient measures 4.5 mmHg. Aortic valve peak gradient measures 8.5 mmHg. Aortic valve area, by VTI measures 2.96 cm. Pulmonic Valve: The pulmonic valve was normal in structure. Pulmonic valve regurgitation is not visualized. No evidence of pulmonic stenosis. Aorta: The aortic root is normal in size and structure. Venous: The inferior vena cava was not well visualized. IAS/Shunts: No atrial level shunt detected by color flow Doppler.  LEFT VENTRICLE PLAX 2D LVIDd:         5.10 cm   Diastology LVIDs:         3.60 cm   LV e' medial:    13.70 cm/s LV PW:         0.70 cm   LV E/e' medial:  8.0 LV IVS:        1.40 cm   LV e' lateral:   15.30 cm/s LVOT diam:     2.00 cm   LV E/e' lateral: 7.2 LV SV:         79 LV SV Index:   38 LVOT Area:     3.14 cm  RIGHT VENTRICLE RV Basal diam:  4.20  cm RV Mid diam:    4.10 cm LEFT ATRIUM             Index        RIGHT ATRIUM           Index LA diam:        3.70 cm 1.79 cm/m   RA Area:     19.80 cm LA Vol (A2C):   59.9 ml 28.95 ml/m   RA Volume:   63.10 ml  30.49 ml/m LA Vol (A4C):   54.5 ml 26.34 ml/m LA Biplane Vol: 62.2 ml 30.06 ml/m  AORTIC VALVE AV Area (Vmax):    2.58 cm AV Area (Vmean):   2.75 cm AV Area (VTI):     2.96 cm AV Vmax:           146.00 cm/s AV Vmean:          99.650 cm/s AV VTI:            0.268 m AV Peak Grad:      8.5 mmHg AV Mean Grad:      4.5 mmHg LVOT Vmax:         120.00 cm/s LVOT Vmean:        87.300 cm/s LVOT VTI:          0.253 m LVOT/AV VTI ratio: 0.94  AORTA Ao Root diam: 3.10 cm MITRAL VALVE                TRICUSPID VALVE MV Area (PHT): 3.24 cm     TR Peak grad:   42.8 mmHg MV Decel Time: 234 msec     TR Vmax:        327.00 cm/s MV E velocity: 110.00 cm/s MV A velocity: 99.60 cm/s   SHUNTS MV E/A ratio:  1.10         Systemic VTI:  0.25 m                             Systemic Diam: 2.00 cm Lorine Bears MD Electronically signed by Lorine Bears MD Signature Date/Time: 05/09/2023/10:24:36 AM    Final    DG Chest 2 View  Result Date: 05/08/2023 CLINICAL DATA:  Pneumonia, shortness of breath EXAM: CHEST - 2 VIEW COMPARISON:  05/06/2023 FINDINGS: Improved aeration at the left lung base. Right lower lobe airspace opacity with blunted right lateral costophrenic angle favoring pleural effusion and pneumonia/atelectasis. Stable peripheral 1.3 cm right upper lobe pulmonary nodule, reportedly previously not hypermetabolic on PET-CT. Prior cross-sectional imaging workups have not recommended follow up of this lesion. Heart size is within normal limits on the AP frontal projection. Atherosclerotic calcification of the aortic arch. IMPRESSION: 1. Right lower lobe airspace opacity with blunted right lateral costophrenic angle favoring pleural effusion and pneumonia/atelectasis. 2. Improved aeration at the left lung base. 3. Stable peripheral 1.3 cm right upper lobe pulmonary nodule, reportedly previously not hypermetabolic on PET-CT. Prior cross-sectional imaging workups have not recommended follow up of this lesion.  4.  Aortic Atherosclerosis (ICD10-I70.0). Electronically Signed   By: Gaylyn Rong M.D.   On: 05/08/2023 19:27

## 2023-06-06 NOTE — Telephone Encounter (Signed)
Home Health Verbal Orders - Caller/Agency: Judeth Cornfield with [facility] Callback Number: [phone] Requesting : OT Frequency: 1X6    Judeth Cornfield would also like to request Skilled Nursing for would care, pt has a couple of bed sores.   Please advise. Judeth Cornfield does have a secure voice mail [phone].

## 2023-06-06 NOTE — Assessment & Plan Note (Signed)
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 Labs reviewed and discussed with patient. Lab Results  Component Value Date   HGB 9.2 (L) 06/06/2023   TIBC 253 06/06/2023   IRONPCTSAT 12 (L) 06/06/2023   FERRITIN 263 06/06/2023   Recommend IV Venofer x 1

## 2023-06-06 NOTE — Assessment & Plan Note (Signed)
Retacrit 40,000 units x 1. Repeat labs every 2 weeks +/- Retacrit if hb<10

## 2023-06-06 NOTE — Assessment & Plan Note (Signed)
Continues to have dark stool, indicates ongoing blood loss.  Recommend patient to continue follow up with GI

## 2023-06-07 ENCOUNTER — Telehealth: Payer: Self-pay

## 2023-06-07 ENCOUNTER — Ambulatory Visit: Payer: Medicare Other | Admitting: Physician Assistant

## 2023-06-07 DIAGNOSIS — J188 Other pneumonia, unspecified organism: Secondary | ICD-10-CM | POA: Diagnosis not present

## 2023-06-07 DIAGNOSIS — I130 Hypertensive heart and chronic kidney disease with heart failure and stage 1 through stage 4 chronic kidney disease, or unspecified chronic kidney disease: Secondary | ICD-10-CM | POA: Diagnosis not present

## 2023-06-07 DIAGNOSIS — J440 Chronic obstructive pulmonary disease with acute lower respiratory infection: Secondary | ICD-10-CM | POA: Diagnosis not present

## 2023-06-07 DIAGNOSIS — J9621 Acute and chronic respiratory failure with hypoxia: Secondary | ICD-10-CM | POA: Diagnosis not present

## 2023-06-07 DIAGNOSIS — E1122 Type 2 diabetes mellitus with diabetic chronic kidney disease: Secondary | ICD-10-CM | POA: Diagnosis not present

## 2023-06-07 DIAGNOSIS — I5033 Acute on chronic diastolic (congestive) heart failure: Secondary | ICD-10-CM | POA: Diagnosis not present

## 2023-06-07 NOTE — Telephone Encounter (Signed)
-----   Message from Rickard Patience sent at 06/06/2023  8:36 PM EDT ----- Recommend patient to get 1 dose of IV Venofer, please arrange

## 2023-06-07 NOTE — Telephone Encounter (Signed)
Called and left message with patient that Dr. Cathie Hoops recommends 1 dose of IV Venofer. I notified patient that I will have [name] schedule and notify of date and time.

## 2023-06-07 NOTE — Telephone Encounter (Signed)
Jeremy [name] [facility]- [phone] and left message to proceed with OT and nursing for bed sores

## 2023-06-08 ENCOUNTER — Ambulatory Visit (INDEPENDENT_AMBULATORY_CARE_PROVIDER_SITE_OTHER): Payer: Medicare Other | Admitting: Urology

## 2023-06-08 ENCOUNTER — Encounter: Payer: Self-pay | Admitting: Urology

## 2023-06-08 ENCOUNTER — Ambulatory Visit: Payer: Self-pay | Admitting: *Deleted

## 2023-06-08 VITALS — BP 151/61 | HR 76 | Ht 70.0 in | Wt 206.0 lb

## 2023-06-08 DIAGNOSIS — E1169 Type 2 diabetes mellitus with other specified complication: Secondary | ICD-10-CM | POA: Diagnosis not present

## 2023-06-08 DIAGNOSIS — R339 Retention of urine, unspecified: Secondary | ICD-10-CM

## 2023-06-08 DIAGNOSIS — Z794 Long term (current) use of insulin: Secondary | ICD-10-CM | POA: Diagnosis not present

## 2023-06-08 DIAGNOSIS — E1142 Type 2 diabetes mellitus with diabetic polyneuropathy: Secondary | ICD-10-CM | POA: Diagnosis not present

## 2023-06-08 DIAGNOSIS — E785 Hyperlipidemia, unspecified: Secondary | ICD-10-CM | POA: Diagnosis not present

## 2023-06-08 LAB — HEMOGLOBIN A1C: Hemoglobin A1C: 4.6

## 2023-06-08 LAB — HM DIABETES FOOT EXAM: HM Diabetic Foot Exam: NORMAL

## 2023-06-08 LAB — BLADDER SCAN AMB NON-IMAGING: Scan Result: 329

## 2023-06-08 NOTE — Patient Outreach (Signed)
  Care Coordination   Follow Up Visit Note   06/09/2023 Name: Jeremy [name] MRN: [id number] DOB: [birth year]  Jeremy [name] is a 72 y.o. year old male who sees Jeremy Limerick, MD for primary care. I spoke with Jeremy [name], wife of Jeremy [name] by phone today.  What matters to the patients health and wellness today?  Per wife, have patient remain without catheter and be seen by [facility]      Goals Addressed             This Visit's Progress    Effective management of MS   On track       Interventions Today    Flowsheet Row Most Recent Value  Chronic Disease   Chronic disease during today's visit Other  [MS and Bed sores]  General Interventions   General Interventions Discussed/Reviewed General Interventions Reviewed, Doctor Visits, Communication with  Doctor Visits Discussed/Reviewed Doctor Visits Reviewed, Specialist, PCP  [facility] appointment today to remove catheter, will return later for bladder scan.  Oncology on 8/20]  PCP/Specialist Visits Compliance with follow-up visit  Communication with --  [Call placed to [facility] to follow up on nursing orders as wife state nurse has not been to the home. Advised [facility] that per chart, message was left for [facility] for wound and OT orders]  Education Interventions   Education Provided Provided Education  Provided Verbal Education On When to see the doctor, Medication, Other  [Discussed frequent position changes to decrease risk of increasing bed sores]              SDOH assessments and interventions completed:  No     Care Coordination Interventions:  Yes, provided   Follow up plan: Follow up call scheduled for 8/21    Encounter Outcome:  Pt. Visit Completed   Jeremy Durie, RN, MSN, [facility] Care Management Care Management Coordinator [phone]

## 2023-06-08 NOTE — Patient Outreach (Signed)
  Care Coordination   06/08/2023 Name: Jeremy [name] MRN: [id number] DOB: [birth year]   Care Coordination Outreach Attempts:  An unsuccessful telephone outreach was attempted for a scheduled appointment today.  Follow Up Plan:  Additional outreach attempts will be made to offer the patient care coordination information and services.   Encounter Outcome:  No Answer   Care Coordination Interventions:  No, not indicated    Kemper Durie, RN, MSN, [facility] Care Management Care Management Coordinator [phone]

## 2023-06-08 NOTE — Progress Notes (Signed)
I,Dina M Abdulla,acting as a scribe for Riki Altes, MD.,have documented all relevant documentation on the behalf of Riki Altes, MD,as directed by  Riki Altes, MD while in the presence of Riki Altes, MD.  06/08/2023 1:13 PM   Jeremy [name] 1951/03/16 [id number]  Referring provider: Duanne Limerick, MD [address] [place],  [place] [address]  Chief Complaint  Patient presents with   Other    HPI: Jeremy [name] is a 72 y.o. male presenting for urinary retention.  Admitted 05/06/2023 for acute on chronic respiratory failure and right lower lobe pneumonia. During that hospitalization, he was found to be in urinary retention and was initially treated with the in/out catheterization x2. Started on Tamsulosin, however, had recurrent retention and a Foley catheter was placed at the time of discharge on 05/20/23. He has continued Tamsulosin and presents today for a voiding trial. He was having no bothersome lower urinary tract symptoms prior to his hospital admission.    PMH: Past Medical History:  Diagnosis Date   Chronic pain    Depression    Diabetes (HCC)    GERD (gastroesophageal reflux disease)    GI bleed 12/2022   Hyperlipemia    Hypertension    MS (multiple sclerosis) (HCC)     Surgical History: Past Surgical History:  Procedure Laterality Date   BIOPSY  05/07/2023   Procedure: BIOPSY;  Surgeon: Regis Bill, MD;  Location: [facility] ENDOSCOPY;  Service: Endoscopy;;   COLECTOMY  06-2008   COLONOSCOPY  2015   normal   COLONOSCOPY WITH PROPOFOL N/A 09/10/2021   Procedure: COLONOSCOPY WITH PROPOFOL;  Surgeon: Midge Minium, MD;  Location: [facility] ENDOSCOPY;  Service: Endoscopy;  Laterality: N/A;   ENTEROSCOPY N/A 05/07/2023   Procedure: ENTEROSCOPY;  Surgeon: Regis Bill, MD;  Location: [facility] ENDOSCOPY;  Service: Endoscopy;  Laterality: N/A;   ESOPHAGOGASTRODUODENOSCOPY (EGD) WITH PROPOFOL N/A 09/10/2021   Procedure:  ESOPHAGOGASTRODUODENOSCOPY (EGD) WITH PROPOFOL;  Surgeon: Midge Minium, MD;  Location: [facility] ENDOSCOPY;  Service: Endoscopy;  Laterality: N/A;   GIVENS CAPSULE STUDY N/A 09/25/2021   Procedure: GIVENS CAPSULE STUDY;  Surgeon: Wyline Mood, MD;  Location: [facility] ENDOSCOPY;  Service: Gastroenterology;  Laterality: N/A;    Home Medications:  Allergies as of 06/08/2023       Reactions   Betadine [povidone Iodine]         Medication List        Accurate as of June 08, 2023  1:13 PM. If you have any questions, ask your nurse or doctor.          acetaminophen 500 MG tablet Commonly known as: TYLENOL Take 500 mg by mouth every 6 (six) hours as needed for mild pain.   albuterol 108 (90 Base) MCG/ACT inhaler Commonly known as: VENTOLIN HFA Inhale 2 puffs into the lungs in the morning, at noon, in the evening, and at bedtime.   Betaseron 0.3 MG Kit injection Generic drug: Interferon Beta-1b Inject subcutaneously 1 syringe every other day. What changed:  how much to take how to take this when to take this   cyanocobalamin 500 MCG tablet Commonly known as: VITAMIN B12 Take 1,000 mcg by mouth daily.   furosemide 40 MG tablet Commonly known as: LASIX Take 1 tablet (40 mg total) by mouth every other day. 1/2 pill every other day- Singh   gemfibrozil 600 MG tablet Commonly known as: LOPID TAKE 1 TABLET BY MOUTH TWICE  DAILY BEFORE MEALS   insulin glargine  100 UNIT/ML Solostar Pen Commonly known as: LANTUS Inject 10 Units into the skin daily.   lidocaine 5 % Commonly known as: LIDODERM Place 1 patch onto the skin daily. Remove & Discard patch within 12 hours or as directed by MD   loratadine 10 MG tablet Commonly known as: CLARITIN Take 1 tablet (10 mg total) by mouth daily.   mupirocin ointment 2 % Commonly known as: BACTROBAN Apply 1 Application topically 2 (two) times daily. Apply to effected area bid   nystatin powder Commonly known as: MYCOSTATIN/NYSTOP Apply  topically 3 (three) times daily.   omeprazole 40 MG capsule Commonly known as: PRILOSEC Take 1 capsule (40 mg total) by mouth daily.   pregabalin 200 MG capsule Commonly known as: LYRICA Take 1 capsule (200 mg total) by mouth 2 (two) times daily.   promethazine-dextromethorphan 6.25-15 MG/5ML syrup Commonly known as: PROMETHAZINE-DM Take 5 mLs by mouth 4 (four) times daily as needed for cough.   sertraline 50 MG tablet Commonly known as: ZOLOFT TAKE ONE-HALF TABLET BY MOUTH  DAILY   simvastatin 40 MG tablet Commonly known as: ZOCOR Take 1 tablet (40 mg total) by mouth daily.   tamsulosin 0.4 MG Caps capsule Commonly known as: FLOMAX Take 1 capsule (0.4 mg total) by mouth daily after supper.   traMADol 50 MG tablet Commonly known as: ULTRAM Take 1 tablet (50 mg total) by mouth every 6 (six) hours as needed.        Allergies:  Allergies  Allergen Reactions   Betadine [Povidone Iodine]     Family History: Family History  Problem Relation Age of Onset   Lung cancer Mother    Heart attack Father    Heart attack Brother    COPD Brother    Diabetes Brother    Esophageal cancer Nephew     Social History:  reports that he quit smoking about 17 years ago. His smoking use included cigarettes. He started smoking about 47 years ago. He has a 45 pack-year smoking history. He has never used smokeless tobacco. He reports that he does not currently use alcohol. He reports that he does not use drugs.   Physical Exam: BP (!) 151/61   Pulse 76   Ht 5\' 10"  (1.778 m)   Wt 206 lb (93.4 kg)   BMI 29.56 kg/m   Constitutional:  Alert, No acute distress. HEENT: [place] AT Respiratory: Normal respiratory effort, no increased work of breathing. Psychiatric: Normal mood and affect.   Assessment & Plan:    1. Urinary retention Foley catheter removed. He has a follow up appointment this afternoon for a bladder scan. If voiding trial is successful, recommend a 1 month follow up with  PVR. Continue Tamsulosin.  I have reviewed the above documentation for accuracy and completeness, and I agree with the above.   Riki Altes, MD  [facility] [address], [address] [place], [place] [address] [phone]

## 2023-06-08 NOTE — Progress Notes (Signed)
Catheter Removal  Patient is present today for a catheter removal.  3m of water was drained from the balloon. A 16FR foley cath was removed from the bladder, no complications were noted. Patient tolerated well.  Performed by: JGaspar ColaCMA  Follow up/ Additional notes: This afternoon

## 2023-06-08 NOTE — Progress Notes (Signed)
Simple Catheter Placement  Due to urinary retention patient is present today for a foley cath placement.  Patient was cleaned and prepped in a sterile fashion with H[place].  A 16  FR foley catheter was inserted, urine return was noted  400 ml, urine was clear yellow in color.  The balloon was filled with 10cc of sterile water.  A night  bag was attached for drainage.  Patient tolerated well, no complications were noted   Performed by: Michiel Cowboy, PA-C and Emi Belfast, CMA and [facility], CMA   Additional notes/ Follow up: Return in about 2 weeks (around 06/22/2023) for TOV .

## 2023-06-09 ENCOUNTER — Encounter: Payer: Self-pay | Admitting: Urology

## 2023-06-09 ENCOUNTER — Telehealth: Payer: Self-pay | Admitting: Family Medicine

## 2023-06-09 NOTE — Telephone Encounter (Signed)
Copied from CRM [id number]. Topic: General - Inquiry >> Jun 09, 2023  4:04 PM Haroldine Laws wrote: Reason for CRM: wife called saying she does not need the handicap paper filled out that she brought to the office today.  She will come by later and pick it up.

## 2023-06-10 DIAGNOSIS — I5033 Acute on chronic diastolic (congestive) heart failure: Secondary | ICD-10-CM | POA: Diagnosis not present

## 2023-06-10 DIAGNOSIS — J440 Chronic obstructive pulmonary disease with acute lower respiratory infection: Secondary | ICD-10-CM | POA: Diagnosis not present

## 2023-06-10 DIAGNOSIS — J188 Other pneumonia, unspecified organism: Secondary | ICD-10-CM | POA: Diagnosis not present

## 2023-06-10 DIAGNOSIS — E1122 Type 2 diabetes mellitus with diabetic chronic kidney disease: Secondary | ICD-10-CM | POA: Diagnosis not present

## 2023-06-10 DIAGNOSIS — I130 Hypertensive heart and chronic kidney disease with heart failure and stage 1 through stage 4 chronic kidney disease, or unspecified chronic kidney disease: Secondary | ICD-10-CM | POA: Diagnosis not present

## 2023-06-10 DIAGNOSIS — J9621 Acute and chronic respiratory failure with hypoxia: Secondary | ICD-10-CM | POA: Diagnosis not present

## 2023-06-13 ENCOUNTER — Telehealth: Payer: Self-pay | Admitting: Family Medicine

## 2023-06-13 DIAGNOSIS — J9621 Acute and chronic respiratory failure with hypoxia: Secondary | ICD-10-CM | POA: Diagnosis not present

## 2023-06-13 DIAGNOSIS — J440 Chronic obstructive pulmonary disease with acute lower respiratory infection: Secondary | ICD-10-CM | POA: Diagnosis not present

## 2023-06-13 DIAGNOSIS — I5033 Acute on chronic diastolic (congestive) heart failure: Secondary | ICD-10-CM | POA: Diagnosis not present

## 2023-06-13 DIAGNOSIS — J188 Other pneumonia, unspecified organism: Secondary | ICD-10-CM | POA: Diagnosis not present

## 2023-06-13 DIAGNOSIS — E1122 Type 2 diabetes mellitus with diabetic chronic kidney disease: Secondary | ICD-10-CM | POA: Diagnosis not present

## 2023-06-13 DIAGNOSIS — I130 Hypertensive heart and chronic kidney disease with heart failure and stage 1 through stage 4 chronic kidney disease, or unspecified chronic kidney disease: Secondary | ICD-10-CM | POA: Diagnosis not present

## 2023-06-13 NOTE — Telephone Encounter (Signed)
Copied from CRM [id number]. Topic: General - Other >> Jun 13, 2023 11:43 AM Phill Myron wrote: Reason for CRM: Tommie with [facility] stated that patient Gasbarro missed his therapy appointment August 17th, patient declined to reschedule therapy visit

## 2023-06-14 ENCOUNTER — Inpatient Hospital Stay: Payer: Medicare Other

## 2023-06-14 VITALS — BP 139/58 | HR 76 | Temp 97.1°F | Resp 16

## 2023-06-14 DIAGNOSIS — D631 Anemia in chronic kidney disease: Secondary | ICD-10-CM | POA: Diagnosis not present

## 2023-06-14 DIAGNOSIS — D500 Iron deficiency anemia secondary to blood loss (chronic): Secondary | ICD-10-CM

## 2023-06-14 DIAGNOSIS — E611 Iron deficiency: Secondary | ICD-10-CM | POA: Diagnosis not present

## 2023-06-14 DIAGNOSIS — N1831 Chronic kidney disease, stage 3a: Secondary | ICD-10-CM | POA: Diagnosis not present

## 2023-06-14 MED ORDER — SODIUM CHLORIDE 0.9 % IV SOLN
200.0000 mg | Freq: Once | INTRAVENOUS | Status: AC
  Administered 2023-06-14: 200 mg via INTRAVENOUS
  Filled 2023-06-14: qty 10

## 2023-06-14 MED ORDER — SODIUM CHLORIDE 0.9 % IV SOLN
Freq: Once | INTRAVENOUS | Status: AC
  Filled 2023-06-14: qty 250

## 2023-06-14 NOTE — Telephone Encounter (Signed)
Spoke to pts wife she stated that the did not request a hoyer lift. We received a fax requesting lift. Will decline lift.  KP

## 2023-06-15 ENCOUNTER — Ambulatory Visit: Payer: Self-pay | Admitting: *Deleted

## 2023-06-15 DIAGNOSIS — J440 Chronic obstructive pulmonary disease with acute lower respiratory infection: Secondary | ICD-10-CM | POA: Diagnosis not present

## 2023-06-15 DIAGNOSIS — J9621 Acute and chronic respiratory failure with hypoxia: Secondary | ICD-10-CM | POA: Diagnosis not present

## 2023-06-15 DIAGNOSIS — I130 Hypertensive heart and chronic kidney disease with heart failure and stage 1 through stage 4 chronic kidney disease, or unspecified chronic kidney disease: Secondary | ICD-10-CM | POA: Diagnosis not present

## 2023-06-15 DIAGNOSIS — E1122 Type 2 diabetes mellitus with diabetic chronic kidney disease: Secondary | ICD-10-CM | POA: Diagnosis not present

## 2023-06-15 DIAGNOSIS — J188 Other pneumonia, unspecified organism: Secondary | ICD-10-CM | POA: Diagnosis not present

## 2023-06-15 DIAGNOSIS — I5033 Acute on chronic diastolic (congestive) heart failure: Secondary | ICD-10-CM | POA: Diagnosis not present

## 2023-06-15 NOTE — Patient Outreach (Signed)
  Care Coordination   Follow Up Visit Note   06/15/2023 Name: Jeremy [name] MRN: [id number] DOB: 07/30/1951  Jeremy [name] is a 72 y.o. year old male who sees Duanne Limerick, MD for primary care. I spoke with Jeremy [name], wife of Jeremy [name] by phone today.  What matters to the patients health and wellness today?  Have visit from [facility] for wound care    Goals Addressed             This Visit's Progress    Effective management of MS   On track      Interventions Today    Flowsheet Row Most Recent Value  Chronic Disease   Chronic disease during today's visit Other  [MS/bed sores]  General Interventions   General Interventions Discussed/Reviewed Communication with, General Interventions Reviewed  Communication with --  [Call placed to [facility], notified that [facility] will make first visit tomorrow]              SDOH assessments and interventions completed:  No     Care Coordination Interventions:  Yes, provided   Follow up plan: Follow up call scheduled for 8/29    Encounter Outcome:  Pt. Visit Completed   Kemper Durie, RN, MSN, [facility] Care Management Care Management Coordinator [phone]

## 2023-06-16 ENCOUNTER — Telehealth: Payer: Self-pay | Admitting: Family Medicine

## 2023-06-16 ENCOUNTER — Telehealth: Payer: Self-pay

## 2023-06-16 DIAGNOSIS — I130 Hypertensive heart and chronic kidney disease with heart failure and stage 1 through stage 4 chronic kidney disease, or unspecified chronic kidney disease: Secondary | ICD-10-CM | POA: Diagnosis not present

## 2023-06-16 DIAGNOSIS — E1122 Type 2 diabetes mellitus with diabetic chronic kidney disease: Secondary | ICD-10-CM | POA: Diagnosis not present

## 2023-06-16 DIAGNOSIS — J9621 Acute and chronic respiratory failure with hypoxia: Secondary | ICD-10-CM | POA: Diagnosis not present

## 2023-06-16 DIAGNOSIS — I5033 Acute on chronic diastolic (congestive) heart failure: Secondary | ICD-10-CM | POA: Diagnosis not present

## 2023-06-16 DIAGNOSIS — J188 Other pneumonia, unspecified organism: Secondary | ICD-10-CM | POA: Diagnosis not present

## 2023-06-16 DIAGNOSIS — J440 Chronic obstructive pulmonary disease with acute lower respiratory infection: Secondary | ICD-10-CM | POA: Diagnosis not present

## 2023-06-16 NOTE — Telephone Encounter (Signed)
Home Health Verbal Orders -   Cheryl/  [facility] Callback Number: [phone] Requesting Skilled Nursing For wound care and treat w/ medi honey Frequency: 1 wk 5

## 2023-06-16 NOTE — Telephone Encounter (Signed)
Called pt and wife to verify who [employer] is and do they want the supplies they are offering. Both said no and they do not need them

## 2023-06-16 NOTE — Telephone Encounter (Signed)
Returned Freight forwarder with [facility] call- proceed with skilled nursing for open wound care. Call Back- [phone]

## 2023-06-16 NOTE — Telephone Encounter (Signed)
Copied from CRM [id number]. Topic: General - Other >> Jun 16, 2023  4:26 PM Dondra Prader A wrote: Reason for CRM: Pt wife is calling back back to let Delice Bison know in regards to [employer], this is for his G7 and this is under Dr. Gershon Crane who handles his sugar.

## 2023-06-16 NOTE — Telephone Encounter (Signed)
Copied from CRM [id number]. Topic: General - Inquiry >> Jun 16, 2023  9:42 AM De Blanch wrote: Reason for CRM: Cason from [facility] is calling to f/u on the certificate of medical necessities faxed to the office on 08/13. Resending fax to the office today.  Please advise.

## 2023-06-17 DIAGNOSIS — I5033 Acute on chronic diastolic (congestive) heart failure: Secondary | ICD-10-CM | POA: Diagnosis not present

## 2023-06-17 DIAGNOSIS — I130 Hypertensive heart and chronic kidney disease with heart failure and stage 1 through stage 4 chronic kidney disease, or unspecified chronic kidney disease: Secondary | ICD-10-CM | POA: Diagnosis not present

## 2023-06-17 DIAGNOSIS — J188 Other pneumonia, unspecified organism: Secondary | ICD-10-CM | POA: Diagnosis not present

## 2023-06-17 DIAGNOSIS — J9621 Acute and chronic respiratory failure with hypoxia: Secondary | ICD-10-CM | POA: Diagnosis not present

## 2023-06-17 DIAGNOSIS — E1122 Type 2 diabetes mellitus with diabetic chronic kidney disease: Secondary | ICD-10-CM | POA: Diagnosis not present

## 2023-06-17 DIAGNOSIS — J440 Chronic obstructive pulmonary disease with acute lower respiratory infection: Secondary | ICD-10-CM | POA: Diagnosis not present

## 2023-06-20 ENCOUNTER — Inpatient Hospital Stay: Payer: Medicare Other

## 2023-06-20 VITALS — BP 138/47

## 2023-06-20 DIAGNOSIS — N189 Chronic kidney disease, unspecified: Secondary | ICD-10-CM

## 2023-06-20 DIAGNOSIS — D500 Iron deficiency anemia secondary to blood loss (chronic): Secondary | ICD-10-CM

## 2023-06-20 DIAGNOSIS — N1831 Chronic kidney disease, stage 3a: Secondary | ICD-10-CM | POA: Diagnosis not present

## 2023-06-20 DIAGNOSIS — D631 Anemia in chronic kidney disease: Secondary | ICD-10-CM | POA: Diagnosis not present

## 2023-06-20 DIAGNOSIS — D649 Anemia, unspecified: Secondary | ICD-10-CM

## 2023-06-20 DIAGNOSIS — E611 Iron deficiency: Secondary | ICD-10-CM | POA: Diagnosis not present

## 2023-06-20 LAB — CBC WITH DIFFERENTIAL (CANCER CENTER ONLY)
Abs Immature Granulocytes: 0.05 10*3/uL (ref 0.00–0.07)
Basophils Absolute: 0.1 10*3/uL (ref 0.0–0.1)
Basophils Relative: 1 %
Eosinophils Absolute: 0.2 10*3/uL (ref 0.0–0.5)
Eosinophils Relative: 3 %
HCT: 26.8 % — ABNORMAL LOW (ref 39.0–52.0)
Hemoglobin: 8.7 g/dL — ABNORMAL LOW (ref 13.0–17.0)
Immature Granulocytes: 1 %
Lymphocytes Relative: 4 %
Lymphs Abs: 0.4 10*3/uL — ABNORMAL LOW (ref 0.7–4.0)
MCH: 28.2 pg (ref 26.0–34.0)
MCHC: 32.5 g/dL (ref 30.0–36.0)
MCV: 86.7 fL (ref 80.0–100.0)
Monocytes Absolute: 0.8 10*3/uL (ref 0.1–1.0)
Monocytes Relative: 10 %
Neutro Abs: 7 10*3/uL (ref 1.7–7.7)
Neutrophils Relative %: 81 %
Platelet Count: 286 10*3/uL (ref 150–400)
RBC: 3.09 MIL/uL — ABNORMAL LOW (ref 4.22–5.81)
RDW: 17 % — ABNORMAL HIGH (ref 11.5–15.5)
WBC Count: 8.5 10*3/uL (ref 4.0–10.5)
nRBC: 0 % (ref 0.0–0.2)

## 2023-06-20 MED ORDER — EPOETIN ALFA-EPBX 40000 UNIT/ML IJ SOLN
40000.0000 [IU] | Freq: Once | INTRAMUSCULAR | Status: AC
  Administered 2023-06-20: 40000 [IU] via SUBCUTANEOUS
  Filled 2023-06-20: qty 1

## 2023-06-21 ENCOUNTER — Ambulatory Visit (INDEPENDENT_AMBULATORY_CARE_PROVIDER_SITE_OTHER): Payer: Medicare Other | Admitting: Family Medicine

## 2023-06-21 ENCOUNTER — Encounter: Payer: Self-pay | Admitting: Emergency Medicine

## 2023-06-21 ENCOUNTER — Encounter: Payer: Self-pay | Admitting: Family Medicine

## 2023-06-21 ENCOUNTER — Inpatient Hospital Stay
Admission: EM | Admit: 2023-06-21 | Discharge: 2023-07-05 | DRG: 291 | Disposition: A | Discharge status: Home Health | Payer: Medicare Other | Attending: Internal Medicine | Admitting: Internal Medicine

## 2023-06-21 ENCOUNTER — Other Ambulatory Visit: Payer: Self-pay

## 2023-06-21 ENCOUNTER — Emergency Department: Payer: Medicare Other

## 2023-06-21 VITALS — BP 120/50 | HR 80

## 2023-06-21 DIAGNOSIS — I2510 Atherosclerotic heart disease of native coronary artery without angina pectoris: Secondary | ICD-10-CM | POA: Diagnosis not present

## 2023-06-21 DIAGNOSIS — I5033 Acute on chronic diastolic (congestive) heart failure: Secondary | ICD-10-CM | POA: Diagnosis present

## 2023-06-21 DIAGNOSIS — Z1152 Encounter for screening for COVID-19: Secondary | ICD-10-CM | POA: Diagnosis not present

## 2023-06-21 DIAGNOSIS — I130 Hypertensive heart and chronic kidney disease with heart failure and stage 1 through stage 4 chronic kidney disease, or unspecified chronic kidney disease: Principal | ICD-10-CM | POA: Diagnosis present

## 2023-06-21 DIAGNOSIS — K7460 Unspecified cirrhosis of liver: Secondary | ICD-10-CM | POA: Diagnosis not present

## 2023-06-21 DIAGNOSIS — J441 Chronic obstructive pulmonary disease with (acute) exacerbation: Secondary | ICD-10-CM | POA: Diagnosis present

## 2023-06-21 DIAGNOSIS — I5031 Acute diastolic (congestive) heart failure: Secondary | ICD-10-CM | POA: Diagnosis not present

## 2023-06-21 DIAGNOSIS — E870 Hyperosmolality and hypernatremia: Secondary | ICD-10-CM | POA: Diagnosis not present

## 2023-06-21 DIAGNOSIS — J9601 Acute respiratory failure with hypoxia: Secondary | ICD-10-CM

## 2023-06-21 DIAGNOSIS — F330 Major depressive disorder, recurrent, mild: Secondary | ICD-10-CM | POA: Diagnosis not present

## 2023-06-21 DIAGNOSIS — Z87891 Personal history of nicotine dependence: Secondary | ICD-10-CM

## 2023-06-21 DIAGNOSIS — J918 Pleural effusion in other conditions classified elsewhere: Secondary | ICD-10-CM | POA: Diagnosis not present

## 2023-06-21 DIAGNOSIS — Z794 Long term (current) use of insulin: Secondary | ICD-10-CM | POA: Diagnosis not present

## 2023-06-21 DIAGNOSIS — G35 Multiple sclerosis: Secondary | ICD-10-CM | POA: Diagnosis not present

## 2023-06-21 DIAGNOSIS — N182 Chronic kidney disease, stage 2 (mild): Secondary | ICD-10-CM | POA: Diagnosis not present

## 2023-06-21 DIAGNOSIS — Y92239 Unspecified place in hospital as the place of occurrence of the external cause: Secondary | ICD-10-CM | POA: Diagnosis not present

## 2023-06-21 DIAGNOSIS — Z515 Encounter for palliative care: Secondary | ICD-10-CM | POA: Diagnosis not present

## 2023-06-21 DIAGNOSIS — J9811 Atelectasis: Secondary | ICD-10-CM | POA: Diagnosis not present

## 2023-06-21 DIAGNOSIS — E8809 Other disorders of plasma-protein metabolism, not elsewhere classified: Secondary | ICD-10-CM | POA: Diagnosis present

## 2023-06-21 DIAGNOSIS — Z66 Do not resuscitate: Secondary | ICD-10-CM | POA: Diagnosis present

## 2023-06-21 DIAGNOSIS — Z7189 Other specified counseling: Secondary | ICD-10-CM | POA: Diagnosis not present

## 2023-06-21 DIAGNOSIS — D631 Anemia in chronic kidney disease: Secondary | ICD-10-CM | POA: Diagnosis present

## 2023-06-21 DIAGNOSIS — J811 Chronic pulmonary edema: Secondary | ICD-10-CM | POA: Diagnosis not present

## 2023-06-21 DIAGNOSIS — J690 Pneumonitis due to inhalation of food and vomit: Secondary | ICD-10-CM | POA: Diagnosis not present

## 2023-06-21 DIAGNOSIS — J90 Pleural effusion, not elsewhere classified: Secondary | ICD-10-CM | POA: Diagnosis not present

## 2023-06-21 DIAGNOSIS — Z9981 Dependence on supplemental oxygen: Secondary | ICD-10-CM | POA: Diagnosis not present

## 2023-06-21 DIAGNOSIS — I272 Pulmonary hypertension, unspecified: Secondary | ICD-10-CM | POA: Diagnosis present

## 2023-06-21 DIAGNOSIS — R0989 Other specified symptoms and signs involving the circulatory and respiratory systems: Secondary | ICD-10-CM | POA: Diagnosis not present

## 2023-06-21 DIAGNOSIS — J984 Other disorders of lung: Secondary | ICD-10-CM | POA: Diagnosis not present

## 2023-06-21 DIAGNOSIS — G8929 Other chronic pain: Secondary | ICD-10-CM | POA: Diagnosis present

## 2023-06-21 DIAGNOSIS — I10 Essential (primary) hypertension: Secondary | ICD-10-CM | POA: Diagnosis present

## 2023-06-21 DIAGNOSIS — R339 Retention of urine, unspecified: Secondary | ICD-10-CM | POA: Diagnosis not present

## 2023-06-21 DIAGNOSIS — R0602 Shortness of breath: Secondary | ICD-10-CM

## 2023-06-21 DIAGNOSIS — N186 End stage renal disease: Secondary | ICD-10-CM | POA: Diagnosis not present

## 2023-06-21 DIAGNOSIS — R601 Generalized edema: Secondary | ICD-10-CM

## 2023-06-21 DIAGNOSIS — I509 Heart failure, unspecified: Secondary | ICD-10-CM | POA: Diagnosis not present

## 2023-06-21 DIAGNOSIS — E1122 Type 2 diabetes mellitus with diabetic chronic kidney disease: Secondary | ICD-10-CM | POA: Diagnosis not present

## 2023-06-21 DIAGNOSIS — Z833 Family history of diabetes mellitus: Secondary | ICD-10-CM

## 2023-06-21 DIAGNOSIS — I5A Non-ischemic myocardial injury (non-traumatic): Secondary | ICD-10-CM | POA: Diagnosis present

## 2023-06-21 DIAGNOSIS — R197 Diarrhea, unspecified: Secondary | ICD-10-CM | POA: Diagnosis not present

## 2023-06-21 DIAGNOSIS — J9621 Acute and chronic respiratory failure with hypoxia: Secondary | ICD-10-CM | POA: Diagnosis present

## 2023-06-21 DIAGNOSIS — Z888 Allergy status to other drugs, medicaments and biological substances status: Secondary | ICD-10-CM

## 2023-06-21 DIAGNOSIS — R5381 Other malaise: Secondary | ICD-10-CM | POA: Diagnosis present

## 2023-06-21 DIAGNOSIS — E669 Obesity, unspecified: Secondary | ICD-10-CM | POA: Diagnosis present

## 2023-06-21 DIAGNOSIS — T502X5A Adverse effect of carbonic-anhydrase inhibitors, benzothiadiazides and other diuretics, initial encounter: Secondary | ICD-10-CM | POA: Diagnosis not present

## 2023-06-21 DIAGNOSIS — E871 Hypo-osmolality and hyponatremia: Secondary | ICD-10-CM | POA: Diagnosis present

## 2023-06-21 DIAGNOSIS — E119 Type 2 diabetes mellitus without complications: Secondary | ICD-10-CM

## 2023-06-21 DIAGNOSIS — R531 Weakness: Secondary | ICD-10-CM | POA: Diagnosis not present

## 2023-06-21 DIAGNOSIS — J189 Pneumonia, unspecified organism: Secondary | ICD-10-CM | POA: Diagnosis not present

## 2023-06-21 DIAGNOSIS — R911 Solitary pulmonary nodule: Secondary | ICD-10-CM | POA: Diagnosis not present

## 2023-06-21 DIAGNOSIS — J449 Chronic obstructive pulmonary disease, unspecified: Secondary | ICD-10-CM | POA: Diagnosis not present

## 2023-06-21 DIAGNOSIS — N1831 Chronic kidney disease, stage 3a: Secondary | ICD-10-CM | POA: Diagnosis present

## 2023-06-21 DIAGNOSIS — Z825 Family history of asthma and other chronic lower respiratory diseases: Secondary | ICD-10-CM

## 2023-06-21 DIAGNOSIS — Z800 Family history of malignant neoplasm of digestive organs: Secondary | ICD-10-CM

## 2023-06-21 DIAGNOSIS — E785 Hyperlipidemia, unspecified: Secondary | ICD-10-CM | POA: Diagnosis not present

## 2023-06-21 DIAGNOSIS — I110 Hypertensive heart disease with heart failure: Secondary | ICD-10-CM | POA: Diagnosis not present

## 2023-06-21 DIAGNOSIS — Z9049 Acquired absence of other specified parts of digestive tract: Secondary | ICD-10-CM

## 2023-06-21 DIAGNOSIS — R918 Other nonspecific abnormal finding of lung field: Secondary | ICD-10-CM | POA: Diagnosis not present

## 2023-06-21 DIAGNOSIS — Z6833 Body mass index (BMI) 33.0-33.9, adult: Secondary | ICD-10-CM

## 2023-06-21 DIAGNOSIS — Z8249 Family history of ischemic heart disease and other diseases of the circulatory system: Secondary | ICD-10-CM

## 2023-06-21 DIAGNOSIS — F32A Depression, unspecified: Secondary | ICD-10-CM | POA: Diagnosis present

## 2023-06-21 DIAGNOSIS — Z801 Family history of malignant neoplasm of trachea, bronchus and lung: Secondary | ICD-10-CM

## 2023-06-21 DIAGNOSIS — E876 Hypokalemia: Secondary | ICD-10-CM | POA: Diagnosis not present

## 2023-06-21 DIAGNOSIS — Z7401 Bed confinement status: Secondary | ICD-10-CM | POA: Diagnosis not present

## 2023-06-21 DIAGNOSIS — E8770 Fluid overload, unspecified: Secondary | ICD-10-CM | POA: Diagnosis not present

## 2023-06-21 DIAGNOSIS — Z79899 Other long term (current) drug therapy: Secondary | ICD-10-CM

## 2023-06-21 DIAGNOSIS — Z993 Dependence on wheelchair: Secondary | ICD-10-CM

## 2023-06-21 DIAGNOSIS — E538 Deficiency of other specified B group vitamins: Secondary | ICD-10-CM | POA: Diagnosis present

## 2023-06-21 DIAGNOSIS — Z48813 Encounter for surgical aftercare following surgery on the respiratory system: Secondary | ICD-10-CM | POA: Diagnosis not present

## 2023-06-21 LAB — CBC WITH DIFFERENTIAL/PLATELET
Abs Immature Granulocytes: 0.05 10*3/uL (ref 0.00–0.07)
Basophils Absolute: 0.1 10*3/uL (ref 0.0–0.1)
Basophils Relative: 1 %
Eosinophils Absolute: 0.4 10*3/uL (ref 0.0–0.5)
Eosinophils Relative: 4 %
HCT: 26.7 % — ABNORMAL LOW (ref 39.0–52.0)
Hemoglobin: 8.8 g/dL — ABNORMAL LOW (ref 13.0–17.0)
Immature Granulocytes: 1 %
Lymphocytes Relative: 5 %
Lymphs Abs: 0.5 10*3/uL — ABNORMAL LOW (ref 0.7–4.0)
MCH: 28.4 pg (ref 26.0–34.0)
MCHC: 33 g/dL (ref 30.0–36.0)
MCV: 86.1 fL (ref 80.0–100.0)
Monocytes Absolute: 1.1 10*3/uL — ABNORMAL HIGH (ref 0.1–1.0)
Monocytes Relative: 12 %
Neutro Abs: 7.1 10*3/uL (ref 1.7–7.7)
Neutrophils Relative %: 77 %
Platelets: 326 10*3/uL (ref 150–400)
RBC: 3.1 MIL/uL — ABNORMAL LOW (ref 4.22–5.81)
RDW: 16.7 % — ABNORMAL HIGH (ref 11.5–15.5)
WBC: 9.1 10*3/uL (ref 4.0–10.5)
nRBC: 0 % (ref 0.0–0.2)

## 2023-06-21 LAB — BLOOD GAS, VENOUS
Acid-Base Excess: 4.6 mmol/L — ABNORMAL HIGH (ref 0.0–2.0)
Bicarbonate: 29.8 mmol/L — ABNORMAL HIGH (ref 20.0–28.0)
O2 Saturation: 70.8 %
Patient temperature: 37
pCO2, Ven: 46 mmHg (ref 44–60)
pH, Ven: 7.42 (ref 7.25–7.43)
pO2, Ven: 42 mmHg (ref 32–45)

## 2023-06-21 LAB — COMPREHENSIVE METABOLIC PANEL
ALT: 10 U/L (ref 0–44)
AST: 27 U/L (ref 15–41)
Albumin: 2.1 g/dL — ABNORMAL LOW (ref 3.5–5.0)
Alkaline Phosphatase: 95 U/L (ref 38–126)
Anion gap: 11 (ref 5–15)
BUN: 32 mg/dL — ABNORMAL HIGH (ref 8–23)
CO2: 24 mmol/L (ref 22–32)
Calcium: 7.9 mg/dL — ABNORMAL LOW (ref 8.9–10.3)
Chloride: 85 mmol/L — ABNORMAL LOW (ref 98–111)
Creatinine, Ser: 1.25 mg/dL — ABNORMAL HIGH (ref 0.61–1.24)
GFR, Estimated: >60 mL/min (ref >60)
Glucose, Bld: 117 mg/dL — ABNORMAL HIGH (ref 70–99)
Potassium: 4.5 mmol/L (ref 3.5–5.1)
Sodium: 120 mmol/L — ABNORMAL LOW (ref 135–145)
Total Bilirubin: 0.9 mg/dL (ref 0.3–1.2)
Total Protein: 6.6 g/dL (ref 6.5–8.1)

## 2023-06-21 LAB — APTT: aPTT: 42 seconds — ABNORMAL HIGH (ref 24–36)

## 2023-06-21 LAB — RESP PANEL BY RT-PCR (RSV, FLU A&B, COVID)  RVPGX2
Influenza A by PCR: NEGATIVE
Influenza B by PCR: NEGATIVE
Resp Syncytial Virus by PCR: NEGATIVE
SARS Coronavirus 2 by RT PCR: NEGATIVE

## 2023-06-21 LAB — BASIC METABOLIC PANEL
Anion gap: 10 (ref 5–15)
BUN: 31 mg/dL — ABNORMAL HIGH (ref 8–23)
CO2: 24 mmol/L (ref 22–32)
Calcium: 7.9 mg/dL — ABNORMAL LOW (ref 8.9–10.3)
Chloride: 86 mmol/L — ABNORMAL LOW (ref 98–111)
Creatinine, Ser: 1.27 mg/dL — ABNORMAL HIGH (ref 0.61–1.24)
GFR, Estimated: >60 mL/min (ref >60)
Glucose, Bld: 109 mg/dL — ABNORMAL HIGH (ref 70–99)
Potassium: 4.6 mmol/L (ref 3.5–5.1)
Sodium: 120 mmol/L — ABNORMAL LOW (ref 135–145)

## 2023-06-21 LAB — TROPONIN I (HIGH SENSITIVITY)
Troponin I (High Sensitivity): 21 ng/L — ABNORMAL HIGH (ref <18)
Troponin I (High Sensitivity): 20 ng/L — ABNORMAL HIGH (ref <18)

## 2023-06-21 LAB — AMMONIA: Ammonia: 26 umol/L (ref 9–35)

## 2023-06-21 LAB — PROTIME-INR
INR: 1.3 — ABNORMAL HIGH (ref 0.8–1.2)
Prothrombin Time: 16.7 seconds — ABNORMAL HIGH (ref 11.4–15.2)

## 2023-06-21 LAB — PROCALCITONIN: Procalcitonin: <0.1 ng/mL

## 2023-06-21 LAB — MAGNESIUM: Magnesium: 1.9 mg/dL (ref 1.7–2.4)

## 2023-06-21 LAB — LACTIC ACID, PLASMA: Lactic Acid, Venous: 1.6 mmol/L (ref 0.5–1.9)

## 2023-06-21 LAB — MRSA NEXT GEN BY PCR, NASAL: MRSA by PCR Next Gen: DETECTED — AB

## 2023-06-21 LAB — OSMOLALITY: Osmolality: 265 mOsm/kg — ABNORMAL LOW (ref 275–295)

## 2023-06-21 LAB — BRAIN NATRIURETIC PEPTIDE: B Natriuretic Peptide: 465.9 pg/mL — ABNORMAL HIGH (ref 0.0–100.0)

## 2023-06-21 MED ORDER — SIMVASTATIN 10 MG PO TABS
40.0000 mg | ORAL_TABLET | Freq: Every day | ORAL | Status: DC
  Filled 2023-06-21: qty 4

## 2023-06-21 MED ORDER — INSULIN ASPART 100 UNIT/ML IJ SOLN
0.0000 [IU] | Freq: Three times a day (TID) | INTRAMUSCULAR | Status: DC
  Administered 2023-06-22: 1 [IU] via SUBCUTANEOUS
  Administered 2023-06-22 – 2023-06-23 (×3): 2 [IU] via SUBCUTANEOUS
  Administered 2023-06-23: 1 [IU] via SUBCUTANEOUS
  Administered 2023-06-23: 3 [IU] via SUBCUTANEOUS
  Administered 2023-06-24: 1 [IU] via SUBCUTANEOUS
  Administered 2023-06-25: 2 [IU] via SUBCUTANEOUS
  Administered 2023-06-26: 1 [IU] via SUBCUTANEOUS
  Administered 2023-06-26: 3 [IU] via SUBCUTANEOUS
  Administered 2023-06-27 (×2): 2 [IU] via SUBCUTANEOUS
  Administered 2023-06-28: 1 [IU] via SUBCUTANEOUS
  Administered 2023-06-28: 2 [IU] via SUBCUTANEOUS
  Administered 2023-06-29 – 2023-07-01 (×2): 1 [IU] via SUBCUTANEOUS
  Administered 2023-07-02: 2 [IU] via SUBCUTANEOUS
  Administered 2023-07-02 – 2023-07-04 (×3): 1 [IU] via SUBCUTANEOUS
  Filled 2023-06-21 (×20): qty 1

## 2023-06-21 MED ORDER — HYDRALAZINE HCL 20 MG/ML IJ SOLN: 5.0000 mg | INTRAMUSCULAR | Status: DC | PRN

## 2023-06-21 MED ORDER — NYSTATIN 100000 UNIT/GM EX POWD
Freq: Three times a day (TID) | CUTANEOUS | Status: DC
  Filled 2023-06-21 (×4): qty 15

## 2023-06-21 MED ORDER — IPRATROPIUM-ALBUTEROL 0.5-2.5 (3) MG/3ML IN SOLN
3.0000 mL | RESPIRATORY_TRACT | Status: DC
  Administered 2023-06-21 – 2023-06-22 (×5): 3 mL via RESPIRATORY_TRACT
  Filled 2023-06-21 (×6): qty 3

## 2023-06-21 MED ORDER — TRAMADOL HCL 50 MG PO TABS
50.0000 mg | ORAL_TABLET | Freq: Four times a day (QID) | ORAL | Status: DC | PRN
  Administered 2023-06-21 – 2023-07-04 (×16): 50 mg via ORAL
  Filled 2023-06-21 (×17): qty 1

## 2023-06-21 MED ORDER — INSULIN ASPART 100 UNIT/ML IJ SOLN
0.0000 [IU] | Freq: Every day | INTRAMUSCULAR | Status: DC
  Administered 2023-06-22 – 2023-07-04 (×3): 2 [IU] via SUBCUTANEOUS
  Filled 2023-06-21 (×3): qty 1

## 2023-06-21 MED ORDER — DOXYCYCLINE HYCLATE 100 MG PO TABS
100.0000 mg | ORAL_TABLET | Freq: Two times a day (BID) | ORAL | Status: DC
  Administered 2023-06-21 – 2023-06-24 (×6): 100 mg via ORAL
  Filled 2023-06-21 (×6): qty 1

## 2023-06-21 MED ORDER — METHYLPREDNISOLONE SODIUM SUCC 125 MG IJ SOLR
125.0000 mg | Freq: Once | INTRAMUSCULAR | Status: AC
  Administered 2023-06-21: 125 mg via INTRAVENOUS
  Filled 2023-06-21: qty 2

## 2023-06-21 MED ORDER — VANCOMYCIN HCL 2000 MG/400ML IV SOLN
2000.0000 mg | Freq: Once | INTRAVENOUS | Status: AC
  Administered 2023-06-21: 2000 mg via INTRAVENOUS
  Filled 2023-06-21: qty 400

## 2023-06-21 MED ORDER — SODIUM CHLORIDE 1 G PO TABS
1.0000 g | ORAL_TABLET | Freq: Two times a day (BID) | ORAL | Status: DC
Start: 2023-06-22 — End: 2023-06-21

## 2023-06-21 MED ORDER — GEMFIBROZIL 600 MG PO TABS
600.0000 mg | ORAL_TABLET | Freq: Two times a day (BID) | ORAL | Status: DC
  Administered 2023-06-22 – 2023-07-05 (×27): 600 mg via ORAL
  Filled 2023-06-21 (×29): qty 1

## 2023-06-21 MED ORDER — SERTRALINE HCL 25 MG PO TABS
25.0000 mg | ORAL_TABLET | Freq: Every day | ORAL | Status: DC
  Administered 2023-06-22 – 2023-07-05 (×14): 25 mg via ORAL
  Filled 2023-06-21 (×14): qty 1

## 2023-06-21 MED ORDER — SODIUM CHLORIDE 0.9 % IV SOLN
500.0000 mg | Freq: Once | INTRAVENOUS | Status: AC
  Administered 2023-06-21: 500 mg via INTRAVENOUS
  Filled 2023-06-21: qty 5

## 2023-06-21 MED ORDER — INSULIN GLARGINE-YFGN 100 UNIT/ML ~~LOC~~ SOLN
7.0000 [IU] | Freq: Every day | SUBCUTANEOUS | Status: DC
  Administered 2023-06-22 – 2023-07-04 (×13): 7 [IU] via SUBCUTANEOUS
  Filled 2023-06-21 (×15): qty 0.07

## 2023-06-21 MED ORDER — FUROSEMIDE 10 MG/ML IJ SOLN
40.0000 mg | Freq: Once | INTRAMUSCULAR | Status: AC
  Administered 2023-06-21: 40 mg via INTRAVENOUS
  Filled 2023-06-21: qty 4

## 2023-06-21 MED ORDER — CYANOCOBALAMIN 500 MCG PO TABS
1000.0000 ug | ORAL_TABLET | Freq: Every day | ORAL | Status: DC
  Administered 2023-06-22: 1000 ug via ORAL
  Filled 2023-06-21 (×2): qty 2

## 2023-06-21 MED ORDER — INTERFERON BETA-1B 0.3 MG ~~LOC~~ KIT
0.2500 mg | PACK | SUBCUTANEOUS | Status: DC
  Administered 2023-06-22 – 2023-07-04 (×7): 0.25 mg via SUBCUTANEOUS
  Filled 2023-06-21 (×14): qty 0.3

## 2023-06-21 MED ORDER — SODIUM CHLORIDE 1 G PO TABS
1.0000 g | ORAL_TABLET | Freq: Two times a day (BID) | ORAL | Status: DC
  Administered 2023-06-21: 1 g via ORAL
  Filled 2023-06-21 (×2): qty 1

## 2023-06-21 MED ORDER — PREGABALIN 75 MG PO CAPS
200.0000 mg | ORAL_CAPSULE | Freq: Two times a day (BID) | ORAL | Status: DC
  Administered 2023-06-21 – 2023-07-05 (×28): 200 mg via ORAL
  Filled 2023-06-21 (×8): qty 1
  Filled 2023-06-21: qty 2
  Filled 2023-06-21 (×17): qty 1
  Filled 2023-06-21: qty 2
  Filled 2023-06-21: qty 1

## 2023-06-21 MED ORDER — ACETAMINOPHEN 325 MG PO TABS
650.0000 mg | ORAL_TABLET | Freq: Four times a day (QID) | ORAL | Status: DC | PRN
  Administered 2023-06-22 – 2023-07-04 (×8): 650 mg via ORAL
  Filled 2023-06-21 (×8): qty 2

## 2023-06-21 MED ORDER — IPRATROPIUM-ALBUTEROL 0.5-2.5 (3) MG/3ML IN SOLN
6.0000 mL | Freq: Once | RESPIRATORY_TRACT | Status: AC
  Administered 2023-06-21: 6 mL via RESPIRATORY_TRACT
  Filled 2023-06-21: qty 6

## 2023-06-21 MED ORDER — ALBUTEROL SULFATE (2.5 MG/3ML) 0.083% IN NEBU
2.5000 mg | INHALATION_SOLUTION | RESPIRATORY_TRACT | Status: DC | PRN
  Administered 2023-06-25 – 2023-06-26 (×2): 2.5 mg via RESPIRATORY_TRACT
  Filled 2023-06-21 (×2): qty 3

## 2023-06-21 MED ORDER — DM-GUAIFENESIN ER 30-600 MG PO TB12: 1.0000 | ORAL_TABLET | Freq: Two times a day (BID) | ORAL | Status: DC | PRN

## 2023-06-21 MED ORDER — TAMSULOSIN HCL 0.4 MG PO CAPS
0.4000 mg | ORAL_CAPSULE | Freq: Every day | ORAL | Status: DC
  Administered 2023-06-22 – 2023-07-04 (×13): 0.4 mg via ORAL
  Filled 2023-06-21 (×13): qty 1

## 2023-06-21 MED ORDER — FUROSEMIDE 10 MG/ML IJ SOLN
40.0000 mg | Freq: Two times a day (BID) | INTRAMUSCULAR | Status: DC
  Administered 2023-06-22: 40 mg via INTRAVENOUS
  Filled 2023-06-21: qty 4

## 2023-06-21 MED ORDER — SODIUM CHLORIDE 0.9 % IV SOLN
2.0000 g | Freq: Once | INTRAVENOUS | Status: AC
  Administered 2023-06-21: 2 g via INTRAVENOUS
  Filled 2023-06-21: qty 12.5

## 2023-06-21 MED ORDER — CHLORHEXIDINE GLUCONATE CLOTH 2 % EX PADS
6.0000 | MEDICATED_PAD | Freq: Every day | CUTANEOUS | Status: DC
  Administered 2023-06-21 – 2023-06-28 (×8): 6 via TOPICAL

## 2023-06-21 MED ORDER — ONDANSETRON HCL 4 MG/2ML IJ SOLN: 4.0000 mg | Freq: Three times a day (TID) | INTRAMUSCULAR | Status: DC | PRN

## 2023-06-21 MED ORDER — VANCOMYCIN HCL IN DEXTROSE 1-5 GM/200ML-% IV SOLN: 1000.0000 mg | Freq: Once | INTRAVENOUS | Status: DC

## 2023-06-21 MED ORDER — METHYLPREDNISOLONE SODIUM SUCC 40 MG IJ SOLR
40.0000 mg | Freq: Two times a day (BID) | INTRAMUSCULAR | Status: DC
  Administered 2023-06-22 – 2023-06-23 (×3): 40 mg via INTRAVENOUS
  Filled 2023-06-21 (×3): qty 1

## 2023-06-21 MED ORDER — LORATADINE 10 MG PO TABS
10.0000 mg | ORAL_TABLET | Freq: Every day | ORAL | Status: DC
  Administered 2023-06-22 – 2023-07-05 (×14): 10 mg via ORAL
  Filled 2023-06-21 (×14): qty 1

## 2023-06-21 MED ORDER — PANTOPRAZOLE SODIUM 40 MG PO TBEC
40.0000 mg | DELAYED_RELEASE_TABLET | Freq: Every day | ORAL | Status: DC
  Administered 2023-06-22 – 2023-06-24 (×3): 40 mg via ORAL
  Filled 2023-06-21 (×3): qty 1

## 2023-06-21 MED ORDER — HEPARIN SODIUM (PORCINE) 5000 UNIT/ML IJ SOLN
5000.0000 [IU] | Freq: Three times a day (TID) | INTRAMUSCULAR | Status: DC
  Administered 2023-06-21 – 2023-06-22 (×3): 5000 [IU] via SUBCUTANEOUS
  Filled 2023-06-21 (×3): qty 1

## 2023-06-21 NOTE — H&P (Signed)
History and Physical    [name] ZDG:[id number] DOB: May 21, 1951 DOA: 06/21/2023  Referring MD/NP/PA:   PCP: Jeremy Limerick, MD   Patient coming from:  The patient is coming from home.     Chief Complaint: SOB  HPI: Jeremy [name] is a 72 y.o. male with medical history significant of HTN, HLD, DM, chronic respiratory failure, COPD on 4L O2, dCHF, GIB, depression, MS (wheelchair-bound), pulmonary hypertension, chronic pain, liver cirrhosis, obesity, anemia, who presents with SOB.  Patient was recently hospitalized from 7/12 - 7/26 due to CHF exacerbation and right lobar pneumonia. Patient states that he has worsening shortness of breath in the past several days, which has been progressively worsening.  Patient has cough with clear thick mucus production.  Denies chest pain, fever or chills.  Patient also has worsening bilateral leg edema and 13 pounds of weight gain recently.  Patient is normally using 4L of oxygen, but was found to have severe respiratory distress, using accessory muscle for breathing in ED, oxygen saturation 88% on 5 L oxygen.  Patient was given 1 dose of Lasix to 40 mg, 125 mg of Solu-Medrol and nebulizer treatment in ED. He feels better.  Currently on 8 L of oxygen with 98% of saturation.  Respiration rate down from 22 to 15.  Patient does not have nausea, vomiting, diarrhea or abdominal pain. Patient had urinary retention with Foley catheter placed recently.  After removal of Foley catheter, patient had bladder scan in PCPs office 2 weeks ago, which showed urinary retention, so Foley catheter was placed again.    Data reviewed independently and ED Course: pt was found to have BMP for 65.9, troponin level 21 --> 20, lactic acid of 9.6, WBC 9.1, INR 1.3, sodium 120, stable renal function, temperature normal, blood pressure 125/49, heart rate 80s.  VBG with pH 7.42, CO2 46, O2 70.8.  Chest x-ray showed moderate right pleural effusion.  Patient is admitted to stepdown as  inpatient.   EKG: Unstable baseline, seem to be in sinus rhythm, QTc 473, low voltage, LAD, poor R wave progression, anteroseptal infarction pattern   Review of Systems:   General: no fevers, chills, has body weight gain, has poor appetite, has fatigue HEENT: no blurry vision, hearing changes or sore throat Respiratory: has dyspnea, coughing, no wheezing CV: no chest pain, no palpitations GI: no nausea, vomiting, abdominal pain, diarrhea, constipation GU: no dysuria, burning on urination, increased urinary frequency, hematuria  Ext: has leg edema Neuro: no unilateral weakness, numbness, or tingling, no vision change or hearing loss Skin: no rash, no skin tear. MSK: No muscle spasm, no deformity, no limitation of range of movement in spin Heme: No easy bruising.  Travel history: No recent long distant travel.   Allergy:  Allergies  Allergen Reactions   Betadine [Povidone Iodine]     Past Medical History:  Diagnosis Date   Chronic pain    Depression    Diabetes (HCC)    GERD (gastroesophageal reflux disease)    GI bleed 12/2022   Hyperlipemia    Hypertension    MS (multiple sclerosis) (HCC)     Past Surgical History:  Procedure Laterality Date   BIOPSY  05/07/2023   Procedure: BIOPSY;  Surgeon: Jeremy Bill, MD;  Location: [facility] ENDOSCOPY;  Service: Endoscopy;;   COLECTOMY  06-2008   COLONOSCOPY  2015   normal   COLONOSCOPY WITH PROPOFOL N/A 09/10/2021   Procedure: COLONOSCOPY WITH PROPOFOL;  Surgeon: Jeremy Minium, MD;  Location: [facility] ENDOSCOPY;  Service: Endoscopy;  Laterality: N/A;   ENTEROSCOPY N/A 05/07/2023   Procedure: ENTEROSCOPY;  Surgeon: Jeremy Bill, MD;  Location: [facility] ENDOSCOPY;  Service: Endoscopy;  Laterality: N/A;   ESOPHAGOGASTRODUODENOSCOPY (EGD) WITH PROPOFOL N/A 09/10/2021   Procedure: ESOPHAGOGASTRODUODENOSCOPY (EGD) WITH PROPOFOL;  Surgeon: Jeremy Minium, MD;  Location: [facility] ENDOSCOPY;  Service: Endoscopy;  Laterality: N/A;   GIVENS  CAPSULE STUDY N/A 09/25/2021   Procedure: GIVENS CAPSULE STUDY;  Surgeon: Jeremy Mood, MD;  Location: [facility] ENDOSCOPY;  Service: Gastroenterology;  Laterality: N/A;    Social History:  reports that he quit smoking about 17 years ago. His smoking use included cigarettes. He started smoking about 47 years ago. He has a 45 pack-year smoking history. He has never used smokeless tobacco. He reports that he does not currently use alcohol. He reports that he does not use drugs.  Family History:  Family History  Problem Relation Age of Onset   Lung cancer Mother    Heart attack Father    Heart attack Brother    COPD Brother    Diabetes Brother    Esophageal cancer Nephew      Prior to Admission medications   Medication Sig Start Date End Date Taking? Authorizing Provider  acetaminophen (TYLENOL) 500 MG tablet Take 500 mg by mouth every 6 (six) hours as needed for mild pain.    [provider]  albuterol (VENTOLIN HFA) 108 (90 Base) MCG/ACT inhaler Inhale 2 puffs into the lungs in the morning, at noon, in the evening, and at bedtime.    [provider]  cyanocobalamin (VITAMIN B12) 500 MCG tablet Take 1,000 mcg by mouth daily.    [provider]  furosemide (LASIX) 40 MG tablet Take 1 tablet (40 mg total) by mouth every other day. 1/2 pill every other day- Thedore Mins 05/20/23   Enedina Finner, MD  gemfibrozil (LOPID) 600 MG tablet TAKE 1 TABLET BY MOUTH TWICE  DAILY BEFORE MEALS 05/03/23   Jeremy Limerick, MD  insulin glargine (LANTUS) 100 UNIT/ML Solostar Pen Inject 10 Units into the skin daily. 05/20/23   Enedina Finner, MD  Interferon Beta-1b (BETASERON) 0.3 MG KIT injection Inject subcutaneously 1 syringe every other day. Patient taking differently: Inject 0.25 mg into the skin every other day. Inject subcutaneously 1 syringe every other day. 08/25/22   Levert Feinstein, MD  lidocaine (LIDODERM) 5 % Place 1 patch onto the skin daily. Remove & Discard patch within 12 hours or as directed by  MD 05/21/23   Enedina Finner, MD  loratadine (CLARITIN) 10 MG tablet Take 1 tablet (10 mg total) by mouth daily. 12/31/22   Jeremy Limerick, MD  Jeremy [name] ointment (BACTROBAN) 2 % Apply 1 Application topically 2 (two) times daily. Apply to effected area bid 07/02/22   Jeremy Limerick, MD  nystatin (MYCOSTATIN/NYSTOP) powder Apply topically 3 (three) times daily. 05/20/23   Enedina Finner, MD  omeprazole (PRILOSEC) 40 MG capsule Take 1 capsule (40 mg total) by mouth daily. 05/20/23   Enedina Finner, MD  pregabalin (LYRICA) 200 MG capsule Take 1 capsule (200 mg total) by mouth 2 (two) times daily. 12/30/22   Edward Jolly, MD  promethazine-dextromethorphan (PROMETHAZINE-DM) 6.25-15 MG/5ML syrup Take 5 mLs by mouth 4 (four) times daily as needed for cough. Patient not taking: Reported on 06/21/2023 06/02/23   Jeremy Limerick, MD  sertraline (ZOLOFT) 50 MG tablet TAKE ONE-HALF TABLET BY MOUTH  DAILY 05/03/23   Jeremy Limerick, MD  simvastatin (ZOCOR) 40 MG tablet Take  1 tablet (40 mg total) by mouth daily. 12/31/22   Jeremy Limerick, MD  tamsulosin (FLOMAX) 0.4 MG CAPS capsule Take 1 capsule (0.4 mg total) by mouth daily after supper. 05/20/23   Enedina Finner, MD  traMADol (ULTRAM) 50 MG tablet Take 1 tablet (50 mg total) by mouth every 6 (six) hours as needed. 04/08/23 07/07/23  Edward Jolly, MD    Physical Exam: Vitals:   06/21/23 1830 06/21/23 1900 06/21/23 1915 06/21/23 1930  BP: (!) 126/49 (!) 147/56  (!) 136/56  Pulse: 71 78 78 76  Resp: 12 15 18 18   Temp:      TempSrc:      SpO2: 98% 94% 93% 94%  Weight:      Height:       General: in acute respiratory distress HEENT:       Eyes: PERRL, EOMI, no jaundice       ENT: No discharge from the ears and nose, no pharynx injection, no tonsillar enlargement.        Neck: positive JVD, no bruit, no mass felt. Heme: No neck lymph node enlargement. Cardiac: S1/S2, RRR, No murmurs, No gallops or rubs. Respiratory: has diffused crackles, with rhonchi bilaterally, has  decreased air movement on the right side GI: Soft, nondistended, nontender, no rebound pain, no organomegaly, BS present. GU: No hematuria Ext: 3+ pitting leg edema bilaterally. 1+DP/PT pulse bilaterally. Musculoskeletal: No joint deformities, No joint redness or warmth, no limitation of ROM in spin. Skin: No rashes.  Neuro: Alert, oriented X3, cranial nerves II-XII grossly intact, moves all extremities. Psych: Patient is not psychotic, no suicidal or hemocidal ideation.  Labs on Admission: I have personally reviewed following labs and imaging studies  CBC: Recent Labs  Lab 06/20/23 1330 06/21/23 1504  WBC 8.5 9.1  NEUTROABS 7.0 7.1  HGB 8.7* 8.8*  HCT 26.8* 26.7*  MCV 86.7 86.1  PLT 286 326   Basic Metabolic Panel: Recent Labs  Lab 06/21/23 1504 06/21/23 1850  NA 120* 120*  K 4.5 4.6  CL 85* 86*  CO2 24 24  GLUCOSE 117* 109*  BUN 32* 31*  CREATININE 1.25* 1.27*  CALCIUM 7.9* 7.9*  MG  --  1.9   GFR: Estimated Creatinine Clearance: 64 mL/min (A) (by C-G formula based on SCr of 1.27 mg/dL (H)). Liver Function Tests: Recent Labs  Lab 06/21/23 1504  AST 27  ALT 10  ALKPHOS 95  BILITOT 0.9  PROT 6.6  ALBUMIN 2.1*   No results for input(s): "LIPASE", "AMYLASE" in the last 168 hours. No results for input(s): "AMMONIA" in the last 168 hours. Coagulation Profile: Recent Labs  Lab 06/21/23 1513  INR 1.3*   Cardiac Enzymes: No results for input(s): "CKTOTAL", "CKMB", "CKMBINDEX", "TROPONINI" in the last 168 hours. BNP (last 3 results) No results for input(s): "PROBNP" in the last 8760 hours. HbA1C: No results for input(s): "HGBA1C" in the last 72 hours. CBG: No results for input(s): "GLUCAP" in the last 168 hours. Lipid Profile: No results for input(s): "CHOL", "HDL", "LDLCALC", "TRIG", "CHOLHDL", "LDLDIRECT" in the last 72 hours. Thyroid Function Tests: No results for input(s): "TSH", "T4TOTAL", "FREET4", "T3FREE", "THYROIDAB" in the last 72 hours. Anemia  Panel: No results for input(s): "VITAMINB12", "FOLATE", "FERRITIN", "TIBC", "IRON", "RETICCTPCT" in the last 72 hours. Urine analysis:    Component Value Date/Time   COLORURINE YELLOW (A) 05/19/2023 1315   APPEARANCEUR CLEAR (A) 05/19/2023 1315   APPEARANCEUR Hazy 08/27/2013 1157   LABSPEC 1.008 05/19/2023 1315  LABSPEC 1.013 08/27/2013 1157   PHURINE 5.0 05/19/2023 1315   GLUCOSEU NEGATIVE 05/19/2023 1315   GLUCOSEU Negative 08/27/2013 1157   HGBUR LARGE (A) 05/19/2023 1315   BILIRUBINUR NEGATIVE 05/19/2023 1315   BILIRUBINUR Negative 08/27/2013 1157   KETONESUR NEGATIVE 05/19/2023 1315   PROTEINUR NEGATIVE 05/19/2023 1315   NITRITE NEGATIVE 05/19/2023 1315   LEUKOCYTESUR NEGATIVE 05/19/2023 1315   LEUKOCYTESUR Negative 08/27/2013 1157   Sepsis Labs: @LABRCNTIP (procalcitonin:4,lacticidven:4) ) Recent Results (from the past 240 hour(s))  Resp panel by RT-PCR (RSV, Flu A&B, Covid) Anterior Nasal Swab     Status: None   Collection Time: 06/21/23  4:35 PM   Specimen: Anterior Nasal Swab  Result Value Ref Range Status   SARS Coronavirus 2 by RT PCR NEGATIVE NEGATIVE Final    Comment: (NOTE) SARS-CoV-2 target nucleic acids are NOT DETECTED.  The SARS-CoV-2 RNA is generally detectable in upper respiratory specimens during the acute phase of infection. The lowest concentration of SARS-CoV-2 viral copies this assay can detect is 138 copies/mL. A negative result does not preclude SARS-Cov-2 infection and should not be used as the sole basis for treatment or other patient management decisions. A negative result may occur with  improper specimen collection/handling, submission of specimen other than nasopharyngeal swab, presence of viral mutation(s) within the areas targeted by this assay, and inadequate number of viral copies(<138 copies/mL). A negative result must be combined with clinical observations, patient history, and epidemiological information. The expected result is  Negative.  Fact Sheet for Patients:  BloggerCourse.com  Fact Sheet for Healthcare Providers:  SeriousBroker.it  This test is no t yet approved or cleared by the [place] FDA and  has been authorized for detection and/or diagnosis of SARS-CoV-2 by FDA under an Emergency Use Authorization (EUA). This EUA will remain  in effect (meaning this test can be used) for the duration of the COVID-19 declaration under Section 564(b)(1) of the Act, 21 U.S.C.section 360bbb-3(b)(1), unless the authorization is terminated  or revoked sooner.       Influenza A by PCR NEGATIVE NEGATIVE Final   Influenza B by PCR NEGATIVE NEGATIVE Final    Comment: (NOTE) The Xpert Xpress SARS-CoV-2/FLU/RSV plus assay is intended as an aid in the diagnosis of influenza from Nasopharyngeal swab specimens and should not be used as a sole basis for treatment. Nasal washings and aspirates are unacceptable for Xpert Xpress SARS-CoV-2/FLU/RSV testing.  Fact Sheet for Patients: BloggerCourse.com  Fact Sheet for Healthcare Providers: SeriousBroker.it  This test is not yet approved or cleared by the [place] FDA and has been authorized for detection and/or diagnosis of SARS-CoV-2 by FDA under an Emergency Use Authorization (EUA). This EUA will remain in effect (meaning this test can be used) for the duration of the COVID-19 declaration under Section 564(b)(1) of the Act, 21 U.S.C. section 360bbb-3(b)(1), unless the authorization is terminated or revoked.     Resp Syncytial Virus by PCR NEGATIVE NEGATIVE Final    Comment: (NOTE) Fact Sheet for Patients: BloggerCourse.com  Fact Sheet for Healthcare Providers: SeriousBroker.it  This test is not yet approved or cleared by the [place] FDA and has been authorized for detection and/or diagnosis of  SARS-CoV-2 by FDA under an Emergency Use Authorization (EUA). This EUA will remain in effect (meaning this test can be used) for the duration of the COVID-19 declaration under Section 564(b)(1) of the Act, 21 U.S.C. section 360bbb-3(b)(1), unless the authorization is terminated or revoked.  Performed at [facility], [address]  [place], [place] [address]      Radiological Exams on Admission: DG Chest 2 View  Result Date: 06/21/2023 CLINICAL DATA:  Shortness of breath.  COPD.  Suspected sepsis. EXAM: CHEST - 2 VIEW COMPARISON:  05/27/2023. FINDINGS: Redemonstration of moderate right layering pleural effusion with associated probable atelectatic changes in the right lung lower lobe. Correlate clinically to exclude superimposed pneumonia. No significant interval change. There is mild pulmonary vascular congestion. Left lung and left lateral costophrenic angle are clear. Stable cardio-mediastinal silhouette. No acute osseous abnormalities. The soft tissues are within normal limits. IMPRESSION: Moderate right pleural effusion with associated probable atelectatic changes in the right lung lower lobe. Correlate clinically to exclude superimposed pneumonia. No significant interval change. Electronically Signed   By: Jules Schick M.D.   On: 06/21/2023 16:21      Assessment/Plan Principal Problem:   Acute on chronic respiratory failure with hypoxia (HCC) Active Problems:   Acute on chronic diastolic CHF (congestive heart failure) (HCC)   COPD exacerbation (HCC)   Moderate pulmonary hypertension (HCC)   Pleural effusion   Essential hypertension   Hyperlipidemia   Multiple sclerosis (HCC)   Myocardial injury   Stage 3a chronic kidney disease (HCC)   Anemia in chronic kidney disease (CKD)   Hyponatremia   Cirrhosis of liver without ascites (HCC)   Urinary retention   Depression   Obesity (BMI 30-39.9)   Assessment and Plan:  Acute on chronic respiratory failure with  hypoxia: this is due to CHF and COPD exacerbation.  Patient has 3+ leg edema, positive JVD, crackles on auscultation, 13 pounds weight gain recently, elevated BNP 465, clinically consistent with CHF exacerbation.  Patient has rhonchi on auscultation, indicating possible COPD exacerbation.  Normally using 4L of oxygen, currently on 8 L oxygen, respiration rate 13-15 currently.  -Admitted to stepdown seen patient -Bronchodilators and Solu-Medrol for COPD exacerbation -Carefully use IV Lasix due to hyponatremia -Continue 8 L nasal cannula oxygen, and try to wean down oxygen level  Acute on chronic diastolic CHF in the setting of moderate pulmonary hypertension: 2D echo on 05/09/2023 showed EF of 55 to 60%.  Now has CHF exacerbation.  Patient has hyponatremia with sodium 120, limiting aggressive diuresis.  After giving 40 mg of Lasix in the ED, his sodium remained 120.  Mental status normal.  -Lasix 40 mg bid by IV and monitor sodium level closely -Daily weights -strict I/O's -Fluid restriction -As needed bronchodilators for shortness of breath  COPD exacerbation (HCC): -Bronchodilators -Solu-Medrol 40 mg IV bid (received 125 mg of Solu-Medrol in ED) -Oral doxycycline 100 mg twice daily  -Mucinex for cough  -Incentive spirometry -sputum culture  Pleural effusion: Patient has moderate right pleural effusion.  Patient received 1 dose of vancomycin, cefepime and azithromycin in ED due to concerning for pneumonia, but patient does not have fever or leukocytosis, and procalcitonin level  0.10, <clinically does not seem to have pneumonia.   -Hold off IV antibiotics -Follow-up of blood culture which is ordered by EDP -Patient is on IV Lasix.  If no improvement, may consider thoracentesis.  Essential hypertension -IV hydralazine as needed -Patient is on IV Lasix  Hyperlipidemia -Zocor and lopid  Multiple sclerosis (HCC) -pt is on Betaseron injection -Fall precaution  Myocardial injury:  troponin level 21 --> -20.  No chest pain -Continue Zocor  Stage 3a chronic kidney disease (HCC): Stable.  Recent baseline 0.90 1.2-1.4.  His creatinine is 1.25, BUN 32, GFR> 60 -Monitor renal function with BMP  Anemia in  chronic kidney disease (CKD): Hemoglobin stable, 8.8 (9.2 on 06/06/2023, and 8.7 on 06/20/2023) -Follow-up with CBC  Hyponatremia: So 120.  Mental status normal. - Will check urine sodium, urine osmolality, serum osmolality. 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  INR 1.3 -Check ammonia level -Check PTT  Urinary retention: Has a Foley catheter in place -Flomax  Depression -Continue home medication, Zoloft  Obesity (BMI 30-39.9): Body weight well 2.4 kg, BMI 32.40 -Encouraged losing weight -Exercise and healthy diet        DVT ppx: SQ Heparin  Code Status: Full code per patient  Family Communication:  Yes, patient's wife and daughter    at bed side.     Disposition Plan:  Anticipate discharge back to previous environment  Consults called:  none  Admission status and Level of care: Stepdown:   as inpt      Dispo: The patient is from: Home              Anticipated d/c is to: Home              Anticipated d/c date is: 2 days              Patient currently is not medically stable to d/c.    Severity of Illness:  The appropriate patient status for this patient is INPATIENT. Inpatient status is judged to be reasonable and necessary in order to provide the required intensity of service to ensure the patient's safety. The patient's presenting symptoms, physical exam findings, and initial radiographic and laboratory data in the context of their chronic comorbidities is felt to place them at high risk for further clinical deterioration. Furthermore, it is not anticipated that the patient will be medically stable for discharge from  the hospital within 2 midnights of admission.   * I certify that at the point of admission it is my clinical judgment that the patient will require inpatient hospital care spanning beyond 2 midnights from the point of admission due to high intensity of service, high risk for further deterioration and high frequency of surveillance required.*       Date of Service 06/21/2023    Lorretta Harp [facility]   If 7PM-7AM, please contact night-coverage [email or web address] 06/21/2023, 7:48 PM

## 2023-06-21 NOTE — ED Notes (Signed)
Patient placed on non re-breather at this time. 

## 2023-06-21 NOTE — Progress Notes (Signed)
Date:  06/21/2023   Name:  Jeremy [name]   DOB:  Feb 24, 1951   MRN:  [id number]   Chief Complaint: Cough and Leg Swelling (Legs have been swollen x 3-4 days. Pitting edema- worse in R)- can't weigh)  Cough Chronicity: worsened last night. The problem has been gradually worsening. The cough is Productive of sputum. Associated symptoms include shortness of breath. Pertinent negatives include no chest pain, chills, fever, postnasal drip or wheezing. Nothing aggravates the symptoms. Treatments tried: diuretic.    Lab Results  Component Value Date   NA 135 05/19/2023   K 4.5 05/19/2023   CO2 26 05/19/2023   GLUCOSE 89 05/19/2023   BUN 33 (H) 05/19/2023   CREATININE 1.46 (H) 05/19/2023   CALCIUM 7.9 (L) 05/19/2023   EGFR 53 (L) 02/15/2022   GFRNONAA 51 (L) 05/19/2023   Lab Results  Component Value Date   CHOL 103 07/10/2021   HDL 22 (L) 07/10/2021   LDLCALC 35 07/10/2021   TRIG 228 (H) 07/10/2021   CHOLHDL 4.7 07/10/2021   Lab Results  Component Value Date   TSH 1.90 03/30/2021   Lab Results  Component Value Date   HGBA1C 4.4 (L) 11/24/2022   Lab Results  Component Value Date   WBC 8.5 06/20/2023   HGB 8.7 (L) 06/20/2023   HCT 26.8 (L) 06/20/2023   MCV 86.7 06/20/2023   PLT 286 06/20/2023   Lab Results  Component Value Date   ALT 18 05/15/2023   AST 33 05/15/2023   ALKPHOS 108 05/15/2023   BILITOT 0.6 05/15/2023   No results found for: "25OHVITD2", "25OHVITD3", "VD25OH"   Review of Systems  Constitutional:  Negative for chills and fever.  HENT:  Negative for postnasal drip.   Respiratory:  Positive for cough and shortness of breath. Negative for chest tightness, wheezing and stridor.   Cardiovascular:  Positive for leg swelling. Negative for chest pain and palpitations.    Patient Active Problem List   Diagnosis Date Noted   Myocardial injury 05/11/2023   Moderate pulmonary hypertension (HCC) 05/09/2023   Cirrhosis of liver without ascites (HCC)  05/08/2023   Acute on chronic diastolic CHF (congestive heart failure) (HCC) 05/08/2023   Symptomatic anemia 05/06/2023   GI bleed 11/24/2022   Arteriovenous malformation (AVM) 10/21/2022   Generalized weakness 08/18/2022   AKI (acute kidney injury) (HCC) 08/18/2022   Severe sepsis (HCC) 02/04/2022   Right lower lobe pneumonia 02/03/2022   Acute on chronic respiratory failure with hypoxia (HCC) 02/03/2022   Hyperkalemia 02/03/2022   COPD (chronic obstructive pulmonary disease) (HCC) 02/03/2022   SIRS (systemic inflammatory response syndrome), possible sepsis (HCC) 02/03/2022   Chronic blood loss anemia 09/24/2021   Gastric polyp    Gastritis without bleeding    Anemia in chronic kidney disease (CKD) 06/19/2021   Iron deficiency anemia due to chronic blood loss 06/19/2021   Gait abnormality 05/07/2021   Stage 3a chronic kidney disease (HCC) 05/20/2020   DM type 2 with diabetic peripheral neuropathy (HCC) 05/06/2020   Idiopathic peripheral neuropathy 01/15/2020   Generalized edema 01/15/2020   Primary pulmonary hypertension (HCC) 10/28/2019   Therapeutic drug monitoring 03/23/2018   Mild episode of recurrent major depressive disorder (HCC) 09/26/2017   Ventricular ectopic beats 09/26/2017   Type 2 diabetes mellitus with diabetic polyneuropathy, with long-term current use of insulin (HCC) 08/12/2017   Hyperlipidemia due to type 2 diabetes mellitus (HCC) 08/12/2017   B12 deficiency 12/14/2016   Low back pain 03/10/2016   Lumbosacral  disc disease 01/01/2016   Neuropathy associated with endocrine disorder (HCC) 11/19/2015   Iron deficiency anemia 06/03/2015   Essential hypertension 06/03/2015   Hyperlipidemia 06/03/2015   Depression 06/03/2015   Gastroesophageal reflux disease without esophagitis 06/03/2015   Edema extremities 06/03/2015   Multiple sclerosis (HCC) 07/26/2013   Abnormality of gait 07/26/2013   Morbid obesity (HCC) 07/26/2013    Allergies  Allergen Reactions    Betadine [Povidone Iodine]     Past Surgical History:  Procedure Laterality Date   BIOPSY  05/07/2023   Procedure: BIOPSY;  Surgeon: Regis Bill, MD;  Location: [facility] ENDOSCOPY;  Service: Endoscopy;;   COLECTOMY  06-2008   COLONOSCOPY  2015   normal   COLONOSCOPY WITH PROPOFOL N/A 09/10/2021   Procedure: COLONOSCOPY WITH PROPOFOL;  Surgeon: Midge Minium, MD;  Location: [facility] ENDOSCOPY;  Service: Endoscopy;  Laterality: N/A;   ENTEROSCOPY N/A 05/07/2023   Procedure: ENTEROSCOPY;  Surgeon: Regis Bill, MD;  Location: [facility] ENDOSCOPY;  Service: Endoscopy;  Laterality: N/A;   ESOPHAGOGASTRODUODENOSCOPY (EGD) WITH PROPOFOL N/A 09/10/2021   Procedure: ESOPHAGOGASTRODUODENOSCOPY (EGD) WITH PROPOFOL;  Surgeon: Midge Minium, MD;  Location: [facility] ENDOSCOPY;  Service: Endoscopy;  Laterality: N/A;   GIVENS CAPSULE STUDY N/A 09/25/2021   Procedure: GIVENS CAPSULE STUDY;  Surgeon: Wyline Mood, MD;  Location: [facility] ENDOSCOPY;  Service: Gastroenterology;  Laterality: N/A;    Social History   Tobacco Use   Smoking status: Former    Current packs/day: 0.00    Average packs/day: 1.5 packs/day for 30.0 years (45.0 ttl pk-yrs)    Types: Cigarettes    Start date: 01/24/1976    Quit date: 01/23/2006    Years since quitting: 17.4   Smokeless tobacco: Never   Tobacco comments:    N/A  Vaping Use   Vaping status: Never Used  Substance Use Topics   Alcohol use: Not Currently    Comment: rare; maybe 2 beers a year   Drug use: No     Medication list has been reviewed and updated.  Current Meds  Medication Sig   acetaminophen (TYLENOL) 500 MG tablet Take 500 mg by mouth every 6 (six) hours as needed for mild pain.   albuterol (VENTOLIN HFA) 108 (90 Base) MCG/ACT inhaler Inhale 2 puffs into the lungs in the morning, at noon, in the evening, and at bedtime.   cyanocobalamin (VITAMIN B12) 500 MCG tablet Take 1,000 mcg by mouth daily.   furosemide (LASIX) 40 MG tablet Take 1 tablet (40 mg total) by  mouth every other day. 1/2 pill every other day- Singh   gemfibrozil (LOPID) 600 MG tablet TAKE 1 TABLET BY MOUTH TWICE  DAILY BEFORE MEALS   insulin glargine (LANTUS) 100 UNIT/ML Solostar Pen Inject 10 Units into the skin daily.   Interferon Beta-1b (BETASERON) 0.3 MG KIT injection Inject subcutaneously 1 syringe every other day. (Patient taking differently: Inject 0.25 mg into the skin every other day. Inject subcutaneously 1 syringe every other day.)   lidocaine (LIDODERM) 5 % Place 1 patch onto the skin daily. Remove & Discard patch within 12 hours or as directed by MD   loratadine (CLARITIN) 10 MG tablet Take 1 tablet (10 mg total) by mouth daily.   mupirocin ointment (BACTROBAN) 2 % Apply 1 Application topically 2 (two) times daily. Apply to effected area bid   nystatin (MYCOSTATIN/NYSTOP) powder Apply topically 3 (three) times daily.   omeprazole (PRILOSEC) 40 MG capsule Take 1 capsule (40 mg total) by mouth daily.   pregabalin (LYRICA)  200 MG capsule Take 1 capsule (200 mg total) by mouth 2 (two) times daily.   sertraline (ZOLOFT) 50 MG tablet TAKE ONE-HALF TABLET BY MOUTH  DAILY   simvastatin (ZOCOR) 40 MG tablet Take 1 tablet (40 mg total) by mouth daily.   tamsulosin (FLOMAX) 0.4 MG CAPS capsule Take 1 capsule (0.4 mg total) by mouth daily after supper.   traMADol (ULTRAM) 50 MG tablet Take 1 tablet (50 mg total) by mouth every 6 (six) hours as needed.       05/27/2023    2:41 PM 12/31/2022    1:16 PM 12/10/2022    2:19 PM 11/16/2022    3:56 PM  GAD 7 : Generalized Anxiety Score  Nervous, Anxious, on Edge 0 0 0 0  Control/stop worrying 0 0 0 0  Worry too much - different things 0 0 0 0  Trouble relaxing 0 0 0 0  Restless 0 0 0 0  Easily annoyed or irritable 0 0 0 0  Afraid - awful might happen 0 0 0 0  Total GAD 7 Score 0 0 0 0  Anxiety Difficulty Not difficult at all Not difficult at all Not difficult at all Not difficult at all       05/27/2023    2:39 PM 12/31/2022    1:16 PM  12/10/2022    2:19 PM  Depression screen PHQ 2/9  Decreased Interest 0 0 0  Down, Depressed, Hopeless 0 0 0  PHQ - 2 Score 0 0 0  Altered sleeping 0 0 0  Tired, decreased energy 0 0 0  Change in appetite 0 0 0  Feeling bad or failure about yourself  0 0 0  Trouble concentrating 0 0 0  Moving slowly or fidgety/restless 0 0 0  Suicidal thoughts 0 0 0  PHQ-9 Score 0 0 0  Difficult doing work/chores Not difficult at all Not difficult at all Not difficult at all    BP Readings from Last 3 Encounters:  06/21/23 (!) 120/50  06/20/23 (!) 138/47  06/14/23 (!) 139/58    Physical Exam Vitals and nursing note reviewed.  HENT:     Head: Normocephalic.     Right Ear: External ear normal.     Left Ear: External ear normal.     Nose: Nose normal.     Mouth/Throat:     Mouth: Mucous membranes are moist.  Eyes:     General: No scleral icterus.       Right eye: No discharge.        Left eye: No discharge.     Conjunctiva/sclera: Conjunctivae normal.     Pupils: Pupils are equal, round, and reactive to light.  Neck:     Thyroid: No thyromegaly.     Vascular: No JVD.     Trachea: No tracheal deviation.  Cardiovascular:     Rate and Rhythm: Normal rate and regular rhythm.     Heart sounds: Normal heart sounds, S1 normal and S2 normal. No murmur heard.    No systolic murmur is present.     No diastolic murmur is present.     No friction rub. No gallop. No S3 or S4 sounds.  Pulmonary:     Effort: No respiratory distress.     Breath sounds: Decreased air movement present. Examination of the right-upper field reveals decreased breath sounds. Examination of the right-middle field reveals decreased breath sounds. Examination of the right-lower field reveals decreased breath sounds. Examination of the left-lower field  reveals decreased breath sounds. Decreased breath sounds present. No wheezing, rhonchi or rales.  Abdominal:     General: Bowel sounds are normal.     Palpations: Abdomen is  soft. There is no mass.     Tenderness: There is no abdominal tenderness. There is no guarding or rebound.  Musculoskeletal:        General: No tenderness. Normal range of motion.     Cervical back: Normal range of motion and neck supple.     Right lower leg: No edema.     Left lower leg: No edema.  Lymphadenopathy:     Cervical: No cervical adenopathy.  Skin:    General: Skin is warm.     Findings: No rash.  Neurological:     Mental Status: He is alert and oriented to person, place, and time.     Cranial Nerves: No cranial nerve deficit.     Wt Readings from Last 3 Encounters:  06/08/23 206 lb (93.4 kg)  05/20/23 202 lb 9.6 oz (91.9 kg)  05/03/23 196 lb (88.9 kg)    BP (!) 120/50   Pulse 80   SpO2 (!) 86%   Assessment and Plan:  1. Shortness of breath Chronic.  But worsening over the past 12 hours.  Patient has a productive cough that is only sputum but it is not purulent.  Patient is increasingly short of breath and pulse ox is currently at 85 which is on 4 L of oxygen and patient is tachypneic.  Breath sounds are decreased throughout the right middle and lower lobes as well as the left lower lobe.  This may either be congestive heart failure or an exacerbation with pneumonia as the last time he had a chest x-ray that there was a pneumonia in the right lower lobe.  Patient has been sent to emergency room for evaluation.  2. Generalized edema Significant swelling today of the legs and patient is on significant dosing of furosemide.  I have concerns that he is in congestive failure as well.  Patient is can probably need significant diuresis and that it sounds as if both lungs are filling up with primarily the upper lobes aerated on auscultation.   Elizabeth Sauer, MD

## 2023-06-21 NOTE — ED Triage Notes (Addendum)
Patient to ED via POV from Dr. Yetta Barre office for SOB and bilateral lower leg edema. Patient was 86% on 5L [place] at office. HX of COPD

## 2023-06-21 NOTE — ED Notes (Signed)
Attempted to call report at this time 

## 2023-06-21 NOTE — Plan of Care (Signed)
  Problem: Education: Goal: Ability to describe self-care measures that may prevent or decrease complications (Diabetes Survival Skills Education) will improve Outcome: Progressing Goal: Individualized Educational Video(s) Outcome: Progressing   Problem: Coping: Goal: Ability to adjust to condition or change in health will improve Outcome: Progressing   Problem: Fluid Volume: Goal: Ability to maintain a balanced intake and output will improve Outcome: Progressing   Problem: Health Behavior/Discharge Planning: Goal: Ability to identify and utilize available resources and services will improve Outcome: Progressing Goal: Ability to manage health-related needs will improve Outcome: Progressing   Problem: Metabolic: Goal: Ability to maintain appropriate glucose levels will improve Outcome: Progressing   Problem: Nutritional: Goal: Maintenance of adequate nutrition will improve Outcome: Progressing Goal: Progress toward achieving an optimal weight will improve Outcome: Progressing   Problem: Skin Integrity: Goal: Risk for impaired skin integrity will decrease Outcome: Progressing   Problem: Tissue Perfusion: Goal: Adequacy of tissue perfusion will improve Outcome: Progressing   Problem: Education: Goal: Ability to demonstrate management of disease process will improve Outcome: Progressing Goal: Ability to verbalize understanding of medication therapies will improve Outcome: Progressing Goal: Individualized Educational Video(s) Outcome: Progressing   Problem: Activity: Goal: Capacity to carry out activities will improve Outcome: Progressing   Problem: Cardiac: Goal: Ability to achieve and maintain adequate cardiopulmonary perfusion will improve Outcome: Progressing   Problem: Education: Goal: Knowledge of disease or condition will improve Outcome: Progressing Goal: Knowledge of the prescribed therapeutic regimen will improve Outcome: Progressing Goal:  Individualized Educational Video(s) Outcome: Progressing   Problem: Activity: Goal: Ability to tolerate increased activity will improve Outcome: Progressing Goal: Will verbalize the importance of balancing activity with adequate rest periods Outcome: Progressing   Problem: Respiratory: Goal: Ability to maintain a clear airway will improve Outcome: Progressing Goal: Levels of oxygenation will improve Outcome: Progressing Goal: Ability to maintain adequate ventilation will improve Outcome: Progressing   Problem: Activity: Goal: Ability to tolerate increased activity will improve Outcome: Progressing   Problem: Clinical Measurements: Goal: Ability to maintain a body temperature in the normal range will improve Outcome: Progressing   Problem: Respiratory: Goal: Ability to maintain adequate ventilation will improve Outcome: Progressing Goal: Ability to maintain a clear airway will improve Outcome: Progressing   Problem: Education: Goal: Knowledge of General Education information will improve Description: Including pain rating scale, medication(s)/side effects and non-pharmacologic comfort measures Outcome: Progressing   Problem: Health Behavior/Discharge Planning: Goal: Ability to manage health-related needs will improve Outcome: Progressing   Problem: Clinical Measurements: Goal: Ability to maintain clinical measurements within normal limits will improve Outcome: Progressing Goal: Will remain free from infection Outcome: Progressing Goal: Diagnostic test results will improve Outcome: Progressing Goal: Respiratory complications will improve Outcome: Progressing Goal: Cardiovascular complication will be avoided Outcome: Progressing   Problem: Activity: Goal: Risk for activity intolerance will decrease Outcome: Progressing   Problem: Nutrition: Goal: Adequate nutrition will be maintained Outcome: Progressing   Problem: Coping: Goal: Level of anxiety will  decrease Outcome: Progressing   Problem: Elimination: Goal: Will not experience complications related to bowel motility Outcome: Progressing Goal: Will not experience complications related to urinary retention Outcome: Progressing   Problem: Pain Managment: Goal: General experience of comfort will improve Outcome: Progressing   Problem: Safety: Goal: Ability to remain free from injury will improve Outcome: Progressing   Problem: Skin Integrity: Goal: Risk for impaired skin integrity will decrease Outcome: Progressing

## 2023-06-21 NOTE — Progress Notes (Signed)
Attempted to call ED for report. Nurse not available.

## 2023-06-21 NOTE — Consult Note (Signed)
PHARMACY -  BRIEF ANTIBIOTIC NOTE   Pharmacy has received consult(s) for vancomycin, cefepime from an ED provider.  The patient's profile has been reviewed for ht/wt/allergies/indication/available labs.    One time order(s) placed for vancomycin 2000 mg and cefepime 2g  Further antibiotics/pharmacy consults should be ordered by admitting physician if indicated.                       Thank you,  Celene Squibb, PharmD Clinical Pharmacist 06/21/2023 4:12 PM

## 2023-06-21 NOTE — ED Notes (Signed)
Alerted provider via secure chat of patient's sodium level of 120

## 2023-06-21 NOTE — Progress Notes (Signed)
Patient supplied medication interferon was taken to pharmacy and given to night time pharmacist. Only contain 1 dose. Will continue to monitor.

## 2023-06-21 NOTE — ED Provider Notes (Signed)
[facility] Provider Note    Event Date/Time   First MD Initiated Contact with Patient 06/21/23 1512     (approximate)   History   Shortness of Breath   HPI  Jeremy [name] is a 72 y.o. male with history of anemia, CKD, HTN, DM, CHF, COPD presenting to the emergency department for evaluation of shortness of breath.  Patient presenting from his primary care office.  There, he presented with shortness of breath and bilateral lower extremity swelling.  He is found to have a saturation of 86% on 5 L.  Sent to the ER for further evaluation.  Patient has had a mild cough for a while, but reports that yesterday his cough became worse productive of clear sputum.  No fevers or chills.  No chest pain.  Has been taking his diuretic as directed, 40 mg 1 day followed by 20 mg the next, but over the last several days has noted significantly worsening lower extremity swelling.  Thinks this does feel somewhat similar to when he was recently admitted for pneumonia.  I did review his discharge summary from 05/20/2023.  At that time, patient presented with weakness was found to be anemic requiring blood transfusion with clinical course complicated by acute on chronic hypoxic respiratory failure treated with diuresis, also noted to have a right lower lobe pneumonia.      Physical Exam   Triage Vital Signs: ED Triage Vitals  Encounter Vitals Group     BP 06/21/23 1450 (!) 154/56     Systolic BP Percentile --      Diastolic BP Percentile --      Pulse Rate 06/21/23 1450 85     Resp 06/21/23 1450 (!) 22     Temp 06/21/23 1452 98.5 F (36.9 C)     Temp Source 06/21/23 1452 Oral     SpO2 06/21/23 1450 (!) 76 %     Weight 06/21/23 1449 225 lb 12.8 oz (102.4 kg)     Height 06/21/23 1449 5\' 10"  (1.778 m)     Head Circumference --      Peak Flow --      Pain Score 06/21/23 1448 6     Pain Loc --      Pain Education --      Exclude from Growth Chart --     Most recent vital  signs: Vitals:   06/21/23 2200 06/21/23 2300  BP: (!) 131/46 (!) 129/49  Pulse: 81 79  Resp: 15 12  Temp:    SpO2: 95% 96%     General: Awake, interactive  CV:  Regular rate, good peripheral perfusion, extensive bilateral lower extremity pitting edema Resp:  Lung sounds diminished at bilateral bases, not significantly labored on nonrebreather, occasional expiratory wheezing noted Abd:  Soft, nondistended.  Neuro:  Symmetric facial movement, fluid speech   ED Results / Procedures / Treatments   Labs (all labs ordered are listed, but only abnormal results are displayed) Labs Reviewed  MRSA NEXT GEN BY PCR, NASAL - Abnormal; Notable for the following components:      Result Value   MRSA by PCR Next Gen DETECTED (*)    All other components within normal limits  COMPREHENSIVE METABOLIC PANEL - Abnormal; Notable for the following components:   Sodium 120 (*)    Chloride 85 (*)    Glucose, Bld 117 (*)    BUN 32 (*)    Creatinine, Ser 1.25 (*)    Calcium 7.9 (*)  Albumin 2.1 (*)    All other components within normal limits  CBC WITH DIFFERENTIAL/PLATELET - Abnormal; Notable for the following components:   RBC 3.10 (*)    Hemoglobin 8.8 (*)    HCT 26.7 (*)    RDW 16.7 (*)    Lymphs Abs 0.5 (*)    Monocytes Absolute 1.1 (*)    All other components within normal limits  BLOOD GAS, VENOUS - Abnormal; Notable for the following components:   Bicarbonate 29.8 (*)    Acid-Base Excess 4.6 (*)    All other components within normal limits  PROTIME-INR - Abnormal; Notable for the following components:   Prothrombin Time 16.7 (*)    INR 1.3 (*)    All other components within normal limits  BRAIN NATRIURETIC PEPTIDE - Abnormal; Notable for the following components:   B Natriuretic Peptide 465.9 (*)    All other components within normal limits  OSMOLALITY - Abnormal; Notable for the following components:   Osmolality 265 (*)    All other components within normal limits  APTT -  Abnormal; Notable for the following components:   aPTT 42 (*)    All other components within normal limits  BASIC METABOLIC PANEL - Abnormal; Notable for the following components:   Sodium 120 (*)    Chloride 86 (*)    Glucose, Bld 109 (*)    BUN 31 (*)    Creatinine, Ser 1.27 (*)    Calcium 7.9 (*)    All other components within normal limits  TROPONIN I (HIGH SENSITIVITY) - Abnormal; Notable for the following components:   Troponin I (High Sensitivity) 21 (*)    All other components within normal limits  TROPONIN I (HIGH SENSITIVITY) - Abnormal; Notable for the following components:   Troponin I (High Sensitivity) 20 (*)    All other components within normal limits  RESP PANEL BY RT-PCR (RSV, FLU A&B, COVID)  [id number]  CULTURE, BLOOD (ROUTINE X 2)  CULTURE, BLOOD (ROUTINE X 2)  EXPECTORATED SPUTUM ASSESSMENT W GRAM STAIN, RFLX TO RESP C  LACTIC ACID, PLASMA  PROCALCITONIN  AMMONIA  MAGNESIUM  BASIC METABOLIC PANEL  BASIC METABOLIC PANEL  OSMOLALITY, URINE  SODIUM, URINE, RANDOM  CBC  MAGNESIUM  BASIC METABOLIC PANEL     EKG EKG independently reviewed interpreted by myself (ER attending) demonstrates:  EKG demonstrates sinus rhythm rate of 79, PR 71, QRS 129, QTc 470, artifact present but no acute ST changes  RADIOLOGY Imaging independently reviewed and interpreted by myself demonstrates:  X-Ardis Lawley with right-sided pleural effusion, possible underlying pneumonia  PROCEDURES:  Critical Care performed: Yes, see critical care procedure note(s)  CRITICAL CARE Performed by: Trinna Post   Total critical care time: 32 minutes  Critical care time was exclusive of separately billable procedures and treating other patients.  Critical care was necessary to treat or prevent imminent or life-threatening deterioration.  Critical care was time spent personally by me on the following activities: development of treatment plan with patient and/or surrogate as well as nursing,  discussions with consultants, evaluation of patient's response to treatment, examination of patient, obtaining history from patient or surrogate, ordering and performing treatments and interventions, ordering and review of laboratory studies, ordering and review of radiographic studies, pulse oximetry and re-evaluation of patient's condition.   Procedures   MEDICATIONS ORDERED IN ED: Medications  ipratropium-albuterol (DUONEB) 0.5-2.5 (3) MG/3ML nebulizer solution 3 mL (3 mLs Nebulization Given 06/22/23 0001)  albuterol (PROVENTIL) (2.5 MG/3ML) 0.083% nebulizer solution 2.5 mg (  has no administration in time range)  dextromethorphan-guaiFENesin (MUCINEX DM) 30-600 MG per 12 hr tablet 1 tablet (has no administration in time range)  ondansetron (ZOFRAN) injection 4 mg (has no administration in time range)  hydrALAZINE (APRESOLINE) injection 5 mg (has no administration in time range)  acetaminophen (TYLENOL) tablet 650 mg (has no administration in time range)  insulin aspart (novoLOG) injection 0-5 Units ( Subcutaneous Not Given 06/21/23 2215)  insulin aspart (novoLOG) injection 0-9 Units (has no administration in time range)  heparin injection 5,000 Units (5,000 Units Subcutaneous Given 06/21/23 2215)  traMADol (ULTRAM) tablet 50 mg (50 mg Oral Given 06/21/23 2215)  gemfibrozil (LOPID) tablet 600 mg (has no administration in time range)  simvastatin (ZOCOR) tablet 40 mg (has no administration in time range)  sertraline (ZOLOFT) tablet 25 mg (has no administration in time range)  insulin glargine-yfgn (SEMGLEE) injection 7 Units (has no administration in time range)  pantoprazole (PROTONIX) EC tablet 40 mg (has no administration in time range)  tamsulosin (FLOMAX) capsule 0.4 mg (has no administration in time range)  cyanocobalamin (VITAMIN B12) tablet 1,000 mcg (has no administration in time range)  pregabalin (LYRICA) capsule 200 mg (200 mg Oral Given 06/21/23 2214)  loratadine (CLARITIN) tablet  10 mg (has no administration in time range)  nystatin (MYCOSTATIN/NYSTOP) topical powder ( Topical Given 06/21/23 2214)  methylPREDNISolone sodium succinate (SOLU-MEDROL) 40 mg/mL injection 40 mg (has no administration in time range)  doxycycline (VIBRA-TABS) tablet 100 mg (100 mg Oral Given 06/21/23 2223)  furosemide (LASIX) injection 40 mg (has no administration in time range)  Interferon Beta-1b (BETASERON/EXTAVIA) injection 0.25 mg (has no administration in time range)  sodium chloride tablet 1 g (1 g Oral Given 06/21/23 2215)  Chlorhexidine Gluconate Cloth 2 % PADS 6 each (6 each Topical Given 06/21/23 2212)  furosemide (LASIX) injection 40 mg (40 mg Intravenous Given 06/21/23 1547)  ipratropium-albuterol (DUONEB) 0.5-2.5 (3) MG/3ML nebulizer solution 6 mL (6 mLs Nebulization Given 06/21/23 1547)  methylPREDNISolone sodium succinate (SOLU-MEDROL) 125 mg/2 mL injection 125 mg (125 mg Intravenous Given 06/21/23 1547)  ceFEPIme (MAXIPIME) 2 g in sodium chloride 0.9 % 100 mL IVPB (0 g Intravenous Stopped 06/21/23 1702)  azithromycin (ZITHROMAX) 500 mg in sodium chloride 0.9 % 250 mL IVPB (0 mg Intravenous Stopped 06/21/23 1734)  vancomycin (VANCOREADY) IVPB 2000 mg/400 mL (0 mg Intravenous Stopped 06/21/23 1945)     IMPRESSION / MDM / ASSESSMENT AND PLAN / ED COURSE  I reviewed the triage vital signs and the nursing notes.  Differential diagnosis includes, but is not limited to, recurrent or incompletely treated pneumonia, CHF exacerbation, COPD exacerbation, lower suspicion ACS, PE given alternative explanations for patient's respiratory symptoms  Patient's presentation is most consistent with acute presentation with potential threat to life or bodily function.  72-year male presenting to the emergency department for worsening shortness of breath.  Chronic respiratory failure on 5 L, but on presentation here noted to be significantly hypoxic on this at 76%.  Was placed on a nonrebreather with  improvement in oxygen saturations to 100%.  He was able to be transitioned to high flow nasal cannula at 8 L. Labs notable for hyponatremia with a sodium of 120.  Creatinine stable to improved at 1.25.  Stable anemia with hemoglobin of 8.8.  Normal lactate at 1.6.  BNP elevated at 465.  Chest x-Kiannah Grunow with moderate right pleural effusion, cannot exclude superimposed pneumonia.  Given worsening shortness of breath, did go ahead and treat patient with antibiotics.  Suspect likely volume overload, so did not receive fluids and additionally does not currently meet SIRS criteria.  However, with his worsening oxygen saturation and likely multifactorial acute on chronic respiratory failure, will reach out to hospitalist team to discuss admission.         FINAL CLINICAL IMPRESSION(S) / ED DIAGNOSES   Final diagnoses:  Acute on chronic congestive heart failure, unspecified heart failure type (HCC)  Acute hypoxic respiratory failure (HCC)     Rx / DC Orders   ED Discharge Orders     None        Note:  This document was prepared using Dragon voice recognition software and may include unintentional dictation errors.   Trinna Post, MD 06/22/23 [phone]

## 2023-06-22 ENCOUNTER — Other Ambulatory Visit: Payer: Self-pay | Admitting: Family Medicine

## 2023-06-22 DIAGNOSIS — J9621 Acute and chronic respiratory failure with hypoxia: Secondary | ICD-10-CM | POA: Diagnosis not present

## 2023-06-22 DIAGNOSIS — E782 Mixed hyperlipidemia: Secondary | ICD-10-CM

## 2023-06-22 DIAGNOSIS — Z7189 Other specified counseling: Secondary | ICD-10-CM | POA: Diagnosis not present

## 2023-06-22 LAB — GLUCOSE, CAPILLARY
Glucose-Capillary: 151 mg/dL — ABNORMAL HIGH (ref 70–99)
Glucose-Capillary: 149 mg/dL — ABNORMAL HIGH (ref 70–99)
Glucose-Capillary: 200 mg/dL — ABNORMAL HIGH (ref 70–99)
Glucose-Capillary: 170 mg/dL — ABNORMAL HIGH (ref 70–99)
Glucose-Capillary: 201 mg/dL — ABNORMAL HIGH (ref 70–99)

## 2023-06-22 LAB — CBC
HCT: 22.4 % — ABNORMAL LOW (ref 39.0–52.0)
Hemoglobin: 7.4 g/dL — ABNORMAL LOW (ref 13.0–17.0)
MCH: 28.2 pg (ref 26.0–34.0)
MCHC: 33 g/dL (ref 30.0–36.0)
MCV: 85.5 fL (ref 80.0–100.0)
Platelets: 264 10*3/uL (ref 150–400)
RBC: 2.62 MIL/uL — ABNORMAL LOW (ref 4.22–5.81)
RDW: 16.5 % — ABNORMAL HIGH (ref 11.5–15.5)
WBC: 3.8 10*3/uL — ABNORMAL LOW (ref 4.0–10.5)
nRBC: 0 % (ref 0.0–0.2)

## 2023-06-22 LAB — BASIC METABOLIC PANEL
Anion gap: 10 (ref 5–15)
Anion gap: 9 (ref 5–15)
Anion gap: 9 (ref 5–15)
BUN: 32 mg/dL — ABNORMAL HIGH (ref 8–23)
BUN: 34 mg/dL — ABNORMAL HIGH (ref 8–23)
BUN: 39 mg/dL — ABNORMAL HIGH (ref 8–23)
CO2: 24 mmol/L (ref 22–32)
CO2: 25 mmol/L (ref 22–32)
CO2: 25 mmol/L (ref 22–32)
Calcium: 7.6 mg/dL — ABNORMAL LOW (ref 8.9–10.3)
Calcium: 7.8 mg/dL — ABNORMAL LOW (ref 8.9–10.3)
Calcium: 7.9 mg/dL — ABNORMAL LOW (ref 8.9–10.3)
Chloride: 87 mmol/L — ABNORMAL LOW (ref 98–111)
Chloride: 88 mmol/L — ABNORMAL LOW (ref 98–111)
Chloride: 93 mmol/L — ABNORMAL LOW (ref 98–111)
Creatinine, Ser: 1.3 mg/dL — ABNORMAL HIGH (ref 0.61–1.24)
Creatinine, Ser: 1.3 mg/dL — ABNORMAL HIGH (ref 0.61–1.24)
Creatinine, Ser: 1.3 mg/dL — ABNORMAL HIGH (ref 0.61–1.24)
GFR, Estimated: 59 mL/min — ABNORMAL LOW (ref >60)
GFR, Estimated: 59 mL/min — ABNORMAL LOW (ref >60)
GFR, Estimated: 59 mL/min — ABNORMAL LOW (ref >60)
Glucose, Bld: 162 mg/dL — ABNORMAL HIGH (ref 70–99)
Glucose, Bld: 170 mg/dL — ABNORMAL HIGH (ref 70–99)
Glucose, Bld: 197 mg/dL — ABNORMAL HIGH (ref 70–99)
Potassium: 4.4 mmol/L (ref 3.5–5.1)
Potassium: 4.6 mmol/L (ref 3.5–5.1)
Potassium: 4.8 mmol/L (ref 3.5–5.1)
Sodium: 122 mmol/L — ABNORMAL LOW (ref 135–145)
Sodium: 122 mmol/L — ABNORMAL LOW (ref 135–145)
Sodium: 126 mmol/L — ABNORMAL LOW (ref 135–145)

## 2023-06-22 LAB — RESPIRATORY PANEL BY PCR

## 2023-06-22 LAB — VITAMIN B12: Vitamin B-12: 2287 pg/mL — ABNORMAL HIGH (ref 180–914)

## 2023-06-22 LAB — PHOSPHORUS: Phosphorus: 2.9 mg/dL (ref 2.5–4.6)

## 2023-06-22 LAB — FOLATE: Folate: 5.2 ng/mL — ABNORMAL LOW (ref >5.9)

## 2023-06-22 LAB — SODIUM, URINE, RANDOM: Sodium, Ur: <10 mmol/L

## 2023-06-22 LAB — VITAMIN D 25 HYDROXY (VIT D DEFICIENCY, FRACTURES): Vit D, 25-Hydroxy: 9.07 ng/mL — ABNORMAL LOW (ref 30–100)

## 2023-06-22 LAB — OSMOLALITY, URINE: Osmolality, Ur: 314 mosm/kg (ref 300–900)

## 2023-06-22 LAB — MAGNESIUM: Magnesium: 1.9 mg/dL (ref 1.7–2.4)

## 2023-06-22 MED ORDER — SODIUM CHLORIDE 1 G PO TABS
2.0000 g | ORAL_TABLET | Freq: Three times a day (TID) | ORAL | Status: AC
  Administered 2023-06-22 – 2023-06-26 (×15): 2 g via ORAL
  Filled 2023-06-22 (×16): qty 2

## 2023-06-22 MED ORDER — VITAMIN C 500 MG PO TABS
500.0000 mg | ORAL_TABLET | Freq: Every day | ORAL | Status: DC
  Administered 2023-06-22 – 2023-07-05 (×14): 500 mg via ORAL
  Filled 2023-06-22 (×14): qty 1

## 2023-06-22 MED ORDER — SIMVASTATIN 20 MG PO TABS
40.0000 mg | ORAL_TABLET | Freq: Every day | ORAL | Status: DC
  Administered 2023-06-22 – 2023-07-04 (×13): 40 mg via ORAL
  Filled 2023-06-22 (×13): qty 2

## 2023-06-22 MED ORDER — ENOXAPARIN SODIUM 40 MG/0.4ML IJ SOSY
40.0000 mg | PREFILLED_SYRINGE | Freq: Every evening | INTRAMUSCULAR | Status: DC
  Administered 2023-06-22: 40 mg via SUBCUTANEOUS
  Filled 2023-06-22: qty 0.4

## 2023-06-22 MED ORDER — VITAMIN D (ERGOCALCIFEROL) 1.25 MG (50000 UNIT) PO CAPS
50000.0000 [IU] | ORAL_CAPSULE | ORAL | Status: DC
  Administered 2023-06-22 – 2023-06-29 (×2): 50000 [IU] via ORAL
  Filled 2023-06-22 (×3): qty 1

## 2023-06-22 MED ORDER — IPRATROPIUM-ALBUTEROL 0.5-2.5 (3) MG/3ML IN SOLN
3.0000 mL | Freq: Four times a day (QID) | RESPIRATORY_TRACT | Status: DC
  Administered 2023-06-22 – 2023-06-23 (×3): 3 mL via RESPIRATORY_TRACT
  Filled 2023-06-22 (×3): qty 3

## 2023-06-22 MED ORDER — FOLIC ACID 1 MG PO TABS
1.0000 mg | ORAL_TABLET | Freq: Every day | ORAL | Status: DC
  Administered 2023-06-22 – 2023-07-05 (×14): 1 mg via ORAL
  Filled 2023-06-22 (×14): qty 1

## 2023-06-22 MED ORDER — GUAIFENESIN-DM 100-10 MG/5ML PO SYRP: 10.0000 mL | ORAL_SOLUTION | Freq: Four times a day (QID) | ORAL | Status: DC | PRN

## 2023-06-22 MED ORDER — POLYSACCHARIDE IRON COMPLEX 150 MG PO CAPS
150.0000 mg | ORAL_CAPSULE | Freq: Every day | ORAL | Status: DC
  Administered 2023-06-22 – 2023-07-05 (×14): 150 mg via ORAL
  Filled 2023-06-22 (×14): qty 1

## 2023-06-22 MED ORDER — GUAIFENESIN ER 600 MG PO TB12
600.0000 mg | ORAL_TABLET | Freq: Two times a day (BID) | ORAL | Status: DC
  Administered 2023-06-22 – 2023-07-05 (×27): 600 mg via ORAL
  Filled 2023-06-22 (×27): qty 1

## 2023-06-22 MED ORDER — FUROSEMIDE 10 MG/ML IJ SOLN
4.0000 mg/h | INTRAVENOUS | Status: DC
  Administered 2023-06-22 – 2023-06-28 (×4): 4 mg/h via INTRAVENOUS
  Filled 2023-06-22 (×5): qty 20

## 2023-06-22 NOTE — TOC Initial Note (Signed)
Transition of Care [facility]) - Initial/Assessment Note    Patient Details  Name: Jeremy [name] MRN: [id number] Date of Birth: 1951/09/16  Transition [facility]) CM/SW Contact:    Kreg Shropshire, RN Phone Number: 06/22/2023, 11:53 AM  Clinical Narrative:                  Cm spoke to pt and wife at bedside.  Pt arrived from ED from: Home Caregiver Support: Spouse DME at Home: Mining engineer wheelchair, wheelchair, Environmental health practitioner: wife ambulance Previous Services: [facility] PT/OT/RN HH/SNF Preference: Home health with [facility] First Person of Contact: Wife PCP: Doran Durand, MD  Pt refused SNF he would like to go back with [facility] agency at this time.   Cm will continue to follow for toc needs and d/c planning.    Barriers to Discharge: Continued Medical Work up   Patient Goals and CMS Choice   CMS Medicare.gov Compare Post Acute Care list provided to:: Patient Choice offered to / list presented to : Patient      Expected Discharge Plan and Services     Post Acute Care Choice: Home Health, Skilled Nursing Facility Living arrangements for the past 2 months: Single Family Home                                      Prior Living Arrangements/Services Living arrangements for the past 2 months: Single Family Home Lives with:: Self, Spouse              Current home services: Home PT, Home RN, Home OT, DME    Activities of Daily Living Home Assistive Devices/Equipment: Environmental consultant (specify type), CBG Meter, Insulin Pump, Raised toilet seat with rails, Shower chair without back ADL Screening (condition at time of admission) Patient's cognitive ability adequate to safely complete daily activities?: Yes Is the patient deaf or have difficulty hearing?: No Does the patient have difficulty seeing, even when wearing glasses/contacts?: No Does the patient have difficulty concentrating, remembering, or making decisions?: No Patient able to express need for assistance with  ADLs?: Yes Does the patient have difficulty dressing or bathing?: Yes Independently performs ADLs?: No Communication: Appropriate for developmental age, Independent Dressing (OT): Needs assistance Is this a change from baseline?: Pre-admission baseline Grooming: Needs assistance Is this a change from baseline?: Pre-admission baseline Feeding: Needs assistance Is this a change from baseline?: Pre-admission baseline Bathing: Needs assistance Is this a change from baseline?: Pre-admission baseline Toileting: Needs assistance Is this a change from baseline?: Pre-admission baseline In/Out Bed: Needs assistance Is this a change from baseline?: Pre-admission baseline Walks in Home: Needs assistance Is this a change from baseline?: Pre-admission baseline Does the patient have difficulty walking or climbing stairs?: Yes Weakness of Legs: Both Weakness of Arms/Hands: Both  Permission Sought/Granted                  Emotional Assessment       Orientation: : Oriented to Self, Oriented to Place, Oriented to  Time, Oriented to Situation      Admission diagnosis:  Acute on chronic respiratory failure with hypoxia (HCC) [J96.21] Patient Active Problem List   Diagnosis Date Noted   COPD exacerbation (HCC) 06/21/2023   Obesity (BMI 30-39.9) 06/21/2023   Hyponatremia 06/21/2023   Pleural effusion 06/21/2023   Urinary retention 06/21/2023   Myocardial injury 05/11/2023   Moderate pulmonary hypertension (HCC) 05/09/2023   Cirrhosis of  liver without ascites (HCC) 05/08/2023   Acute on chronic diastolic CHF (congestive heart failure) (HCC) 05/08/2023   Symptomatic anemia 05/06/2023   GI bleed 11/24/2022   Arteriovenous malformation (AVM) 10/21/2022   Generalized weakness 08/18/2022   AKI (acute kidney injury) (HCC) 08/18/2022   Severe sepsis (HCC) 02/04/2022   Right lower lobe pneumonia 02/03/2022   Acute on chronic respiratory failure with hypoxia (HCC) 02/03/2022   Hyperkalemia  02/03/2022   COPD (chronic obstructive pulmonary disease) (HCC) 02/03/2022   SIRS (systemic inflammatory response syndrome), possible sepsis (HCC) 02/03/2022   Chronic blood loss anemia 09/24/2021   Gastric polyp    Gastritis without bleeding    Anemia in chronic kidney disease (CKD) 06/19/2021   Iron deficiency anemia due to chronic blood loss 06/19/2021   Gait abnormality 05/07/2021   Stage 3a chronic kidney disease (HCC) 05/20/2020   DM type 2 with diabetic peripheral neuropathy (HCC) 05/06/2020   Idiopathic peripheral neuropathy 01/15/2020   Generalized edema 01/15/2020   Primary pulmonary hypertension (HCC) 10/28/2019   Therapeutic drug monitoring 03/23/2018   Mild episode of recurrent major depressive disorder (HCC) 09/26/2017   Ventricular ectopic beats 09/26/2017   Type 2 diabetes mellitus with diabetic polyneuropathy, with long-term current use of insulin (HCC) 08/12/2017   Hyperlipidemia due to type 2 diabetes mellitus (HCC) 08/12/2017   B12 deficiency 12/14/2016   Low back pain 03/10/2016   Lumbosacral disc disease 01/01/2016   Neuropathy associated with endocrine disorder (HCC) 11/19/2015   Iron deficiency anemia 06/03/2015   Essential hypertension 06/03/2015   Hyperlipidemia 06/03/2015   Depression 06/03/2015   Gastroesophageal reflux disease without esophagitis 06/03/2015   Edema extremities 06/03/2015   Multiple sclerosis (HCC) 07/26/2013   Abnormality of gait 07/26/2013   Morbid obesity (HCC) 07/26/2013   PCP:  Duanne Limerick, MD Pharmacy:   [facility] [phone] Dan Humphreys, [place] - [address] AT [facility] OF [address] [facility] [place] [address] Phone: [phone] Fax: [phone]  [facility] - [place], [place] - [address] [place] [address] Phone: [phone] Fax: [phone]     Social Determinants of Health (SDOH) Social History: SDOH Screenings   Food  Insecurity: No Food Insecurity (06/21/2023)  Housing: Low Risk  (06/21/2023)  Transportation Needs: No Transportation Needs (06/21/2023)  Utilities: Not At Risk (06/21/2023)  Alcohol Screen: Low Risk  (11/16/2021)  Depression (PHQ2-9): Low Risk  (05/27/2023)  Financial Resource Strain: Low Risk  (11/19/2022)  Physical Activity: Insufficiently Active (11/19/2022)  Social Connections: Moderately Isolated (11/19/2022)  Stress: No Stress Concern Present (11/19/2022)  Tobacco Use: Medium Risk (06/21/2023)   SDOH Interventions:     Readmission Risk Interventions    11/25/2022   10:25 AM 02/04/2022    9:47 AM  Readmission Risk Prevention Plan  Transportation Screening Complete Complete  PCP or Specialist Appt within 3-5 Days Complete Complete  HRI or Home Care Consult Complete   Social Work Consult for Recovery Care Planning/Counseling Complete Complete  Palliative Care Screening Not Applicable Not Applicable  Medication Review Oceanographer) Complete Complete

## 2023-06-22 NOTE — Evaluation (Signed)
Occupational Therapy Evaluation Patient Details Name: Jeremy [name] MRN: [id number] DOB: [birth year] Today's Date: 06/22/2023   History of Present Illness Pt is a 72 year old male presenting to ED with SOB, admitted with acute on chronic respiratory failure due to CHF and COPD exacerbation, pleural effusion     PMH significant for HTN, HLD, DM, chronic respiratory failure, COPD on 4L O2, dCHF, GIB, depression, MS (wheelchair-bound), pulmonary hypertension, chronic pain, liver cirrhosis, obesity, anemia   Clinical Impression   Chart reviewed, pt greeted in bed, alert and oriented x4, increased time for processing, agreeable to OT/PT co tx. Pt wife is present throughout. PTA pt required assist for ADL/IADL, was transferring to wheelchair via sit to stand lift with assist from wife. MAX A+2 required for supine<>sit, static sitting withCGA-supervision, dynamic sitting with at least CGA-MIN A with posterior lean. MAX A+2 for lateral scoot up the bed. MAX A required for LB dressing. Pt presents with deficits in strength, endurance, activity tolerance, balance all affecting safe and optimal ADL completion. Pt will benefit from ongoing OT to address deficits and to facilitate optimal safe ADL completion. OT will follow acutely.       If plan is discharge home, recommend the following: A lot of help with walking and/or transfers;A lot of help with bathing/dressing/bathroom;Assistance with cooking/housework;Assist for transportation;Help with stairs or ramp for entrance;Direct supervision/assist for financial management    Functional Status Assessment  Patient has had a recent decline in their functional status and demonstrates the ability to make significant improvements in function in a reasonable and predictable amount of time.  Equipment Recommendations  None recommended by OT (pt has recommended equipment)    Recommendations for Other Services       Precautions / Restrictions  Precautions Precautions: Fall Restrictions Weight Bearing Restrictions: No      Mobility Bed Mobility Overal bed mobility: Needs Assistance Bed Mobility: Supine to Sit, Sit to Supine     Supine to sit: Max assist, +2 for physical assistance, +2 for safety/equipment, HOB elevated Sit to supine: Max assist, +2 for physical assistance, +2 for safety/equipment        Transfers Overall transfer level: Needs assistance Equipment used: None Transfers:  (lateral scoot up the bed multiple attempts)            Lateral/Scoot Transfers: Max assist, +2 physical assistance, +2 safety/equipment        Balance Overall balance assessment: Needs assistance Sitting-balance support: Feet supported Sitting balance-Leahy Scale: Fair Sitting balance - Comments: at least CGA-MIN A required for dynamic tasks, CGA-supervision for static sitting Postural control: Posterior lean   Standing balance-Leahy Scale: Zero                             ADL either performed or assessed with clinical judgement   ADL Overall ADL's : Needs assistance/impaired Eating/Feeding: Minimal assistance;Sitting   Grooming: Wash/dry face;Set up               Lower Body Dressing: Maximal assistance Lower Body Dressing Details (indicate cue type and reason): socks, bed level     Toileting- Clothing Manipulation and Hygiene: Maximal assistance;Total assistance Toileting - Clothing Manipulation Details (indicate cue type and reason): anticipated             Vision Baseline Vision/History: 1 Wears glasses Patient Visual Report: No change from baseline       Perception         Praxis  Pertinent Vitals/Pain Pain Assessment Pain Assessment: No/denies pain     Extremity/Trunk Assessment Upper Extremity Assessment Upper Extremity Assessment: RUE deficits/detail;LUE deficits/detail RUE Deficits / Details: AROM: shoulder flexion: approx 1/2 full AROM, elbow 3/4 full AROM,  wrist 3/4 full AROM; PROM appears WFL; grossly weak throughout RUE Sensation: WNL RUE Coordination: decreased fine motor;decreased gross motor LUE Deficits / Details: AROM: shoulder flexion: approx 1/2 full AROM, elbow 3/4 full AROM, wrist 3/4 full AROM; PROM appears WFL; grossly weak throughout LUE Sensation: WNL LUE Coordination: decreased fine motor;decreased gross motor   Lower Extremity Assessment Lower Extremity Assessment: Defer to PT evaluation       Communication Communication Communication: Difficulty following commands/understanding Following commands: Follows one step commands with increased time Cueing Techniques: Verbal cues;Tactile cues;Visual cues   Cognition Arousal: Alert Behavior During Therapy: Flat affect Overall Cognitive Status: Impaired/Different from baseline Area of Impairment: Problem solving                             Problem Solving: Slow processing       General Comments  vss throughout on 10L via HFNC    Exercises Other Exercises Other Exercises: edu re: role of rehab (pt/wife familiar), goals for future tx sessions, dc recommendations   Shoulder Instructions      Home Living Family/patient expects to be discharged to:: Private residence Living Arrangements: Spouse/significant other Available Help at Discharge: Family;Available 24 hours/day Type of Home: House Home Access: Ramped entrance     Home Layout: One level     Bathroom Shower/Tub: Sponge bathes at baseline   Bathroom Toilet: Handicapped height     Home Equipment: Wheelchair - Programmer, multimedia (2 wheels);Wheelchair - power;Lift chair          Prior Functioning/Environment Prior Level of Function : Needs assist       Physical Assist : Mobility (physical);ADLs (physical) Mobility (physical): Bed mobility;Transfers;Gait;Stairs ADLs (physical): Bathing;Dressing;Toileting;IADLs Mobility Comments: using sit to stand lift for  transfers from lift chair to wheelchair or bsc, non ambulatory right now; ADLs Comments: SET UP for feeding/grooming, assist for dressing, bathing, toileting at this time however pt does participate; assist for all IADLs        OT Problem List: Decreased strength;Impaired balance (sitting and/or standing);Decreased activity tolerance;Cardiopulmonary status limiting activity;Decreased knowledge of use of DME or AE;Decreased coordination;Impaired UE functional use      OT Treatment/Interventions: Self-care/ADL training;Therapeutic exercise;Patient/family education;Balance training;Energy conservation;Therapeutic activities;DME and/or AE instruction    OT Goals(Current goals can be found in the care plan section) Acute Rehab OT Goals Patient Stated Goal: go home OT Goal Formulation: With patient/family Time For Goal Achievement: 07/06/23 Potential to Achieve Goals: Good ADL Goals Pt Will Perform Grooming: with set-up;sitting Pt Will Perform Lower Body Dressing: with mod assist;sitting/lateral leans Pt Will Transfer to Toilet: with max assist;squat pivot transfer Pt Will Perform Toileting - Clothing Manipulation and hygiene: with max assist;sitting/lateral leans  OT Frequency: Min 1X/week    Co-evaluation PT/OT/SLP Co-Evaluation/Treatment: Yes Reason for Co-Treatment: Complexity of the patient's impairments (multi-system involvement);Necessary to address cognition/behavior during functional activity;For patient/therapist safety   OT goals addressed during session: ADL's and self-care      AM-PAC OT "6 Clicks" Daily Activity     Outcome Measure Help from another person eating meals?: A Little Help from another person taking care of personal grooming?: A Little Help from another person toileting, which includes using toliet, bedpan, or urinal?: Total Help from  another person bathing (including washing, rinsing, drying)?: Total Help from another person to put on and taking off regular  upper body clothing?: A Lot Help from another person to put on and taking off regular lower body clothing?: A Lot 6 Click Score: 12   End of Session Equipment Utilized During Treatment: Oxygen Nurse Communication: Mobility status  Activity Tolerance: Patient tolerated treatment well Patient left: in bed;with call bell/phone within reach;with family/visitor present  OT Visit Diagnosis: Other abnormalities of gait and mobility (R26.89);Muscle weakness (generalized) (M62.81)                Time: 1610-9604 OT Time Calculation (min): 19 min Charges:  OT General Charges $OT Visit: 1 Visit OT Evaluation $OT Eval High Complexity: 1 High  Oleta Mouse, OTD OTR/L  06/22/23, 11:02 AM

## 2023-06-22 NOTE — Progress Notes (Signed)
[facility] Progress Note  Patient: Jeremy [name]    ZOX:[id number]  DOA: 06/21/2023     Date of Service: the patient was seen and examined on 06/22/2023  Chief Complaint  Patient presents with   Shortness of Breath   Brief hospital course: Jeremy [name] is a 72 y.o. male with medical history significant of HTN, HLD, DM, chronic respiratory failure, COPD on 4L O2, dCHF, GIB, depression, MS (wheelchair-bound), pulmonary hypertension, chronic pain, liver cirrhosis, obesity, anemia, who presents with SOB.   Patient was recently hospitalized from 7/12 - 7/26 due to CHF exacerbation and right lobar pneumonia. Patient states that he has worsening shortness of breath in the past several days, which has been progressively worsening.  Patient has cough with clear thick mucus production.  Denies chest pain, fever or chills.  Patient also has worsening bilateral leg edema and 13 pounds of weight gain recently.   Patient is normally using 4L of oxygen, but was found to have severe respiratory distress, using accessory muscle for breathing in ED, oxygen saturation 88% on 5 L oxygen.  Patient was given 1 dose of Lasix to 40 mg, 125 mg of Solu-Medrol and nebulizer treatment in ED. He feels better.  Currently on 8 L of oxygen with 98% of saturation.  Patient had urinary retention with Foley catheter placed recently.  After removal of Foley catheter, patient had bladder scan in PCPs office 2 weeks ago, which showed urinary retention, so Foley catheter was placed again.    Data reviewed independently and ED Course: pt was found to have BMP for 65.9, troponin level 21 --> 20, lactic acid of 9.6, WBC 9.1, INR 1.3, sodium 120, stable renal function, temperature normal, blood pressure 125/49, heart rate 80s.  VBG with pH 7.42, CO2 46, O2 70.8.  Chest x-ray showed moderate right pleural effusion.  Patient is admitted to stepdown as inpatient.     Assessment and Plan:  Acute on chronic hypoxic respiratory  failure most likely due to CHF exacerbation Patient is on 4 L oxygen at baseline Continue supplemental O2 admission and gradually wean down   Diastolic CHF exacerbation Patient presented with shortness of breath and anasarca lower extremity edema S/p Lasix 40 mg IV twice daily Started Lasix IV infusion Monitor intake output and daily body weight Continue fluid restriction 1200 L/day  COPD exacerbation S/p Solu-Medrol 125 mg given in the ED, continue Solu-Medrol 40 mg IV twice daily Continue doxycycline 100 mg p.o. twice daily Started Mucinex extremity gram p.o. twice daily Continue Robitussin DM as needed   Pleural effusion Moderate right pleural effusion most likely due to CHF, liver cirrhosis with hypoalbuminemia Continue diuresis as above If no improvement in shortness of breath then patient may need right thoracentesis, Will follow-up with patient again for thoracentesis tomorrow a.m.  IDDM T2 Held home regimen for now Continue Semglee 7 units nightly NovoLog sliding scale Monitor CBG, continue diabetic diet Risk of hyperglycemia due to steroids   Essential hypertension -IV hydralazine as needed -Patient is on IV Lasix   Hyperlipidemia -Zocor and lopid   Multiple sclerosis (HCC) -pt is on Betaseron injection -Fall precaution   Myocardial injury: troponin level 21 --> -20.  No chest pain -Continue Zocor   Stage 3a chronic kidney disease (HCC): Stable.  Recent baseline 0.90 1.2-1.4.  His creatinine is 1.25, BUN 32, GFR> 60 -Monitor renal function with BMP   Anemia in chronic kidney disease (CKD), Iron deficiency and folic acid deficiency Hemoglobin stable, 8.8 (9.2 on 06/06/2023,  and 8.7 on 06/20/2023) Transferrin saturation 12% Continue oral iron supplement -Follow-up with CBC  Folic acid deficiency, folate level 5.2 Started folate supplement.  Hypotonic hyponatremia: Serum osmolality 265 low Na 120.  Mental status normal. Sodium chloride 2 g p.o. 3  times daily for 5 days Continue fluid restriction 1200 ml/d Check BMP every 12 hourly    # Cirrhosis of liver without ascites (HCC): Mental status normal.  INR 1.3 -ammonia level 26 wnl -INR 1.3, PT 16.7, PTT 42, slightly elevated    # Urinary retention: Has a Foley catheter in place -Flomax   # Depression -Continue home medication, Zoloft  Vitamin D deficiency: started vitamin D 50,000 units p.o. weekly, follow with PCP to repeat vitamin D level after 3 to 6 months.  # Obesity Body mass index is 32.3 kg/m.  Interventions:  Diet: Heart healthy/carb modified, fluid striction 1200 mL/day DVT Prophylaxis: Subcutaneous Lovenox   Advance goals of care discussion: Full code  Family Communication: family was present at bedside, at the time of interview.  The pt provided permission to discuss medical plan with the family. Opportunity was given to ask question and all questions were answered satisfactorily.   Disposition:  Pt is from home, admitted with hyponatremia, anasarca, diastolic CHF, acute on chronic hypoxic respiratory failure, started IV Lasix infusion, which precludes a safe discharge. Discharge to SNF, when stable.  Subjective: No significant events overnight, patient still has shortness of breath and cough with chest congestion, denies any chest pain.  Significant lower extremity edema.  Patient has right-sided pleural effusion, not ready for thoracentesis today, we will follow-up with him tomorrow a.m.   Physical Exam: General: Mild to moderate respiratory distress Appear in mild distress, affect appropriate depressed Eyes: PERRLA ENT: Oral Mucosa Clear, moist  Neck: no JVD,  Cardiovascular: S1 and S2 Present, no Murmur,  Respiratory: Equal air entry bilaterally, bilateral crackles and decreased breath sounds in the bibasilar area more decreased on the right lower area due to pleural effusion Abdomen: Bowel Sound present, Soft and no tenderness,  Skin: no  rashes Extremities: 4+ edema, anasarca, no calf tenderness Neurologic: without any new focal findings Gait not checked due to patient safety concerns  Vitals:   06/22/23 1128 06/22/23 1218 06/22/23 1300 06/22/23 1400  BP:   (!) 140/49 (!) 134/50  Pulse: 74  87 80  Resp: 16  15 12   Temp:      TempSrc:      SpO2: 95% 98% 96% 100%  Weight:      Height:        Intake/Output Summary (Last 24 hours) at 06/22/2023 1415 Last data filed at 06/22/2023 0500 Gross per 24 hour  Intake --  Output 1600 ml  Net -1600 ml   Filed Weights   06/21/23 1449 06/21/23 2010 06/22/23 0443  Weight: 102.4 kg 102.1 kg 102.1 kg    Data Reviewed: I have personally reviewed and interpreted daily labs, tele strips, imagings as discussed above. I reviewed all nursing notes, pharmacy notes, vitals, pertinent old records I have discussed plan of care as described above with RN and patient/family.  CBC: Recent Labs  Lab 06/20/23 1330 06/21/23 1504 06/22/23 0619  WBC 8.5 9.1 3.8*  NEUTROABS 7.0 7.1  --   HGB 8.7* 8.8* 7.4*  HCT 26.8* 26.7* 22.4*  MCV 86.7 86.1 85.5  PLT 286 326 264   Basic Metabolic Panel: Recent Labs  Lab 06/21/23 1504 06/21/23 1850 06/22/23 0026 06/22/23 0619  NA 120* 120* 122*  122*  K 4.5 4.6 4.8 4.6  CL 85* 86* 87* 88*  CO2 24 24 25 25   GLUCOSE 117* 109* 170* 162*  BUN 32* 31* 32* 34*  CREATININE 1.25* 1.27* 1.30* 1.30*  CALCIUM 7.9* 7.9* 7.8* 7.6*  MG  --  1.9  --  1.9  PHOS  --   --   --  2.9    Studies: DG Chest 2 View  Result Date: 06/21/2023 CLINICAL DATA:  Shortness of breath.  COPD.  Suspected sepsis. EXAM: CHEST - 2 VIEW COMPARISON:  05/27/2023. FINDINGS: Redemonstration of moderate right layering pleural effusion with associated probable atelectatic changes in the right lung lower lobe. Correlate clinically to exclude superimposed pneumonia. No significant interval change. There is mild pulmonary vascular congestion. Left lung and left lateral costophrenic  angle are clear. Stable cardio-mediastinal silhouette. No acute osseous abnormalities. The soft tissues are within normal limits. IMPRESSION: Moderate right pleural effusion with associated probable atelectatic changes in the right lung lower lobe. Correlate clinically to exclude superimposed pneumonia. No significant interval change. Electronically Signed   By: Jules Schick M.D.   On: 06/21/2023 16:21    Scheduled Meds:  vitamin C  500 mg Oral Daily   Chlorhexidine Gluconate Cloth  6 each Topical Daily   cyanocobalamin  1,000 mcg Oral Daily   doxycycline  100 mg Oral Q12H   gemfibrozil  600 mg Oral BID AC   guaiFENesin  600 mg Oral BID   heparin  5,000 Units Subcutaneous Q8H   insulin aspart  0-5 Units Subcutaneous QHS   insulin aspart  0-9 Units Subcutaneous TID WC   insulin glargine-yfgn  7 Units Subcutaneous QHS   Interferon Beta-1b  0.25 mg Subcutaneous QODAY   ipratropium-albuterol  3 mL Nebulization Q4H   iron polysaccharides  150 mg Oral Daily   loratadine  10 mg Oral Daily   methylPREDNISolone (SOLU-MEDROL) injection  40 mg Intravenous Q12H   nystatin   Topical TID   pantoprazole  40 mg Oral Daily   pregabalin  200 mg Oral BID   sertraline  25 mg Oral Daily   simvastatin  40 mg Oral q1800   sodium chloride  2 g Oral TID WC   tamsulosin  0.4 mg Oral QPC supper   Continuous Infusions:  furosemide (LASIX) 200 mg in dextrose 5 % 100 mL (2 mg/mL) infusion     PRN Meds: acetaminophen, albuterol, guaiFENesin-dextromethorphan, hydrALAZINE, ondansetron (ZOFRAN) IV, traMADol  Time spent: 55 minutes  Author: Gillis Santa. MD [facility] 06/22/2023 2:15 PM  To reach On-call, see care teams to locate the attending and reach out to them via [email or web address]. If 7PM-7AM, please contact night-coverage If you still have difficulty reaching the attending provider, please page the [facility] (Director on Call) for [facility] on amion for assistance.

## 2023-06-22 NOTE — Evaluation (Signed)
Physical Therapy Evaluation Patient Details Name: Jeremy [name] MRN: [id number] DOB: 05/11/1951 Today's Date: 06/22/2023  History of Present Illness  Pt is a 72 year old male presenting to ED with SOB, admitted with acute on chronic respiratory failure due to CHF and COPD exacerbation, pleural effusion     PMH significant for HTN, HLD, DM, chronic respiratory failure, COPD on 4L O2, dCHF, GIB, depression, MS (wheelchair-bound), pulmonary hypertension, chronic pain, liver cirrhosis, obesity, anemia  Clinical Impression   Pt is seen by OT and PT for a co-evaluation. Pt is received in bed with wife at bedside, he is agreeable to PT session. Pt performed bed mobility and transfers max A x2 due to generalized weakness. Pt able to initiate bed mobility but requires assistance to complete task. Pt able to sit EOB and perform seated balance, however, demonstrates posterior lean posture. Per Pt and spouse, Pt uses STS chair lift for transfers. Pt would benefit from SNF however Pt and spouse would prefer going home with [facility] due to spouse being former CNA and reporting she "can take care of him". Pt would benefit from skilled PT to address above deficits and promote optimal return to PLOF.          If plan is discharge home, recommend the following: Two people to help with walking and/or transfers;Two people to help with bathing/dressing/bathroom;Assist for transportation;Assistance with feeding;Help with stairs or ramp for entrance   Can travel by private vehicle        Equipment Recommendations None recommended by PT  Recommendations for Other Services       Functional Status Assessment Patient has had a recent decline in their functional status and demonstrates the ability to make significant improvements in function in a reasonable and predictable amount of time.     Precautions / Restrictions Precautions Precautions: Fall Restrictions Weight Bearing Restrictions: No      Mobility  Bed  Mobility Overal bed mobility: Needs Assistance Bed Mobility: Supine to Sit, Sit to Supine     Supine to sit: Max assist, +2 for physical assistance, +2 for safety/equipment, HOB elevated Sit to supine: Max assist, +2 for physical assistance, +2 for safety/equipment   General bed mobility comments: Able to initiate bed mobility but required max A x2 to complete task    Transfers Overall transfer level: Needs assistance Equipment used: None              Lateral/Scoot Transfers: Max assist, +2 physical assistance, +2 safety/equipment General transfer comment: cuing to shift BW forward to assist with lat scoots to R side    Ambulation/Gait               General Gait Details: Not performed due to Pt mobilizing with PWC or electric scooter  Stairs            Wheelchair Mobility     Tilt Bed    Modified Rankin (Stroke Patients Only)       Balance Overall balance assessment: Needs assistance Sitting-balance support: Feet supported Sitting balance-Leahy Scale: Fair Sitting balance - Comments: at least CGA required for dynamic tasks, CGA-supervision for static sitting Postural control: Posterior lean     Standing balance comment: NT due to safety concerns                             Pertinent Vitals/Pain Pain Assessment Pain Assessment: No/denies pain    Home Living Family/patient expects to be discharged  to:: Private residence Living Arrangements: Spouse/significant other Available Help at Discharge: Family;Available 24 hours/day Type of Home: House Home Access: Ramped entrance       Home Layout: One level Home Equipment: Wheelchair - Programmer, multimedia (2 wheels);Wheelchair - power;Lift chair;Electric scooter Additional Comments: 4L O2 at baseline    Prior Function Prior Level of Function : Needs assist       Physical Assist : Mobility (physical);ADLs (physical) Mobility (physical): Bed  mobility;Transfers;Gait;Stairs ADLs (physical): Bathing;Dressing;Toileting;IADLs Mobility Comments: using sit to stand lift for transfers from lift chair to wheelchair or bsc, non ambulatory right now; ADLs Comments: SET UP for feeding/grooming, assist for dressing, bathing, toileting at this time however pt does participate; assist for all IADLs     Extremity/Trunk Assessment   Upper Extremity Assessment Upper Extremity Assessment: Defer to OT evaluation RUE Deficits / Details: AROM: shoulder flexion: approx 1/2 full AROM, elbow 3/4 full AROM, wrist 3/4 full AROM; PROM appears WFL; grossly weak throughout RUE Sensation: WNL RUE Coordination: decreased fine motor;decreased gross motor LUE Deficits / Details: AROM: shoulder flexion: approx 1/2 full AROM, elbow 3/4 full AROM, wrist 3/4 full AROM; PROM appears WFL; grossly weak throughout LUE Sensation: WNL LUE Coordination: decreased fine motor;decreased gross motor    Lower Extremity Assessment Lower Extremity Assessment: Generalized weakness       Communication   Communication Communication: Difficulty following commands/understanding Following commands: Follows multi-step commands with increased time Cueing Techniques: Verbal cues;Tactile cues;Visual cues  Cognition Arousal: Alert Behavior During Therapy: Flat affect Overall Cognitive Status: Impaired/Different from baseline Area of Impairment: Problem solving                             Problem Solving: Slow processing General Comments: AOx4 and agreeable to PT session        General Comments General comments (skin integrity, edema, etc.): VSS throughout on 10L HFNC    Exercises     Assessment/Plan    PT Assessment Patient needs continued PT services  PT Problem List Decreased strength;Decreased mobility;Decreased range of motion;Decreased activity tolerance;Decreased balance       PT Treatment Interventions DME instruction;Wheelchair mobility  training;Functional mobility training;Therapeutic activities;Therapeutic exercise;Balance training    PT Goals (Current goals can be found in the Care Plan section)  Acute Rehab PT Goals Patient Stated Goal: to go home PT Goal Formulation: With patient Time For Goal Achievement: 07/06/23 Potential to Achieve Goals: Good    Frequency Min 1X/week     Co-evaluation PT/OT/SLP Co-Evaluation/Treatment: Yes Reason for Co-Treatment: Complexity of the patient's impairments (multi-system involvement);Necessary to address cognition/behavior during functional activity;For patient/therapist safety PT goals addressed during session: Mobility/safety with mobility OT goals addressed during session: ADL's and self-care       AM-PAC PT "6 Clicks" Mobility  Outcome Measure Help needed turning from your back to your side while in a flat bed without using bedrails?: A Lot Help needed moving from lying on your back to sitting on the side of a flat bed without using bedrails?: A Lot Help needed moving to and from a bed to a chair (including a wheelchair)?: Total Help needed standing up from a chair using your arms (e.g., wheelchair or bedside chair)?: Total Help needed to walk in hospital room?: Total Help needed climbing 3-5 steps with a railing? : Total 6 Click Score: 8    End of Session Equipment Utilized During Treatment: Oxygen Activity Tolerance: Patient tolerated treatment well Patient left: in  bed;with call bell/phone within reach;with bed alarm set;with family/visitor present Nurse Communication: Mobility status PT Visit Diagnosis: Unsteadiness on feet (R26.81);Muscle weakness (generalized) (M62.81);Difficulty in walking, not elsewhere classified (R26.2)    Time: 3244-0102 PT Time Calculation (min) (ACUTE ONLY): 20 min   Charges:                 Elmon Else, SPT   Deadra Diggins 06/22/2023, 12:26 PM

## 2023-06-22 NOTE — Consult Note (Addendum)
Consultation Note Date: 06/22/2023   Patient Name: Jeremy [name]  DOB: 1951/05/07  MRN: [id number]  Age / Sex: 72 y.o., male  PCP: Duanne Limerick, MD Referring Physician: Gillis Santa, MD  Reason for Consultation: Establishing goals of care  HPI/Patient Profile: From H&P: Tyas Bingenheimer is a 72 y.o. male with medical history significant of HTN, HLD, DM, chronic respiratory failure, COPD on 4L O2, dCHF, GIB, depression, MS (wheelchair-bound), pulmonary hypertension, chronic pain, liver cirrhosis, obesity, anemia, who presents with SOB.    Clinical Assessment and Goals of Care: Notes and labs reviewed.  In to see patient.  He is currently resting in bed at this time with oxygen in place.  No distress noted.  No family member at bedside.  Patient states he is married and has 2 children, a daughter and a son.  He states he was diagnosed with MS in 2006.  He states he is currently wheelchair-bound.  Patient advises that functionally 2 months ago, he was able to stand from his wheelchair to sit in a chair.  He states he is unable to stand at this time.  We discussed his diagnosis, prognosis, GOC, EOL wishes disposition and options.  A detailed discussion was had today regarding advanced directives.  Concepts specific to code status, artifical feeding and hydration, IV antibiotics and rehospitalization were discussed.  The difference between an aggressive medical intervention path and a comfort care path was discussed.  Values and goals of care important to patient and family were attempted to be elicited.  Discussed limitations of medical interventions to prolong quality of life in some situations and discussed the concept of human mortality.  He states he is discussed goals of care with his family in the past, as he knows his MS is progressive.  He states quality of life is important to him, and as long as he is  able to use his arms to do things for himself, that will be considered an acceptable quality of life. He states he would be amenable to being placed on a ventilator short-term, but would not want to "live in a vegetative state".  He states that currently he would want to try CPR, and then family can make decisions from there.  SUMMARY OF RECOMMENDATIONS    Full code/full scope   Prognosis:  Poor overall       Primary Diagnoses: Present on Admission:  Acute on chronic respiratory failure with hypoxia (HCC)  Acute on chronic diastolic CHF (congestive heart failure) (HCC)  Stage 3a chronic kidney disease (HCC)  Multiple sclerosis (HCC)  Essential hypertension  Hyperlipidemia  Depression  Anemia in chronic kidney disease (CKD)  Myocardial injury  Moderate pulmonary hypertension (HCC)  COPD exacerbation (HCC)  Obesity (BMI 30-39.9)  Hyponatremia  Cirrhosis of liver without ascites (HCC)  Pleural effusion  Urinary retention   I have reviewed the medical record, interviewed the patient and family, and examined the patient. The following aspects are pertinent.  Past Medical History:  Diagnosis Date   Chronic pain  Depression    Diabetes (HCC)    GERD (gastroesophageal reflux disease)    GI bleed 12/2022   Hyperlipemia    Hypertension    MS (multiple sclerosis) (HCC)    Social History   Socioeconomic History   Marital status: Married    Spouse name: Olegario Messier   Number of children: 2   Years of education: GED   Highest education level: Not on file  Occupational History    Comment: Disabled  Tobacco Use   Smoking status: Former    Current packs/day: 0.00    Average packs/day: 1.5 packs/day for 30.0 years (45.0 ttl pk-yrs)    Types: Cigarettes    Start date: 01/24/1976    Quit date: 01/23/2006    Years since quitting: 17.4   Smokeless tobacco: Never   Tobacco comments:    N/A  Vaping Use   Vaping status: Never Used  Substance and Sexual Activity   Alcohol use: Not  Currently    Comment: rare; maybe 2 beers a year   Drug use: No   Sexual activity: Not Currently  Other Topics Concern   Not on file  Social History Narrative   Patient is disabled.    Patient lives with his wife Joseluis Atz.    Patient has 2 children.       Social Determinants of Health   Financial Resource Strain: Low Risk  (11/19/2022)   Overall Financial Resource Strain (CARDIA)    Difficulty of Paying Living Expenses: Not hard at all  Food Insecurity: No Food Insecurity (06/21/2023)   Hunger Vital Sign    Worried About Running Out of Food in the Last Year: Never true    Ran Out of Food in the Last Year: Never true  Transportation Needs: No Transportation Needs (06/21/2023)   PRAPARE - Administrator, Civil Service (Medical): No    Lack of Transportation (Non-Medical): No  Physical Activity: Insufficiently Active (11/19/2022)   Exercise Vital Sign    Days of Exercise per Week: 3 days    Minutes of Exercise per Session: 20 min  Stress: No Stress Concern Present (11/19/2022)   [employer] of Occupational Health - Occupational Stress Questionnaire    Feeling of Stress : Not at all  Social Connections: Moderately Isolated (11/19/2022)   Social Connection and Isolation Panel [NHANES]    Frequency of Communication with Friends and Family: More than three times a week    Frequency of Social Gatherings with Friends and Family: More than three times a week    Attends Religious Services: Never    Database administrator or Organizations: No    Attends Engineer, structural: Never    Marital Status: Married   Family History  Problem Relation Age of Onset   Lung cancer Mother    Heart attack Father    Heart attack Brother    COPD Brother    Diabetes Brother    Esophageal cancer Nephew    Scheduled Meds:  vitamin C  500 mg Oral Daily   Chlorhexidine Gluconate Cloth  6 each Topical Daily   doxycycline  100 mg Oral Q12H   enoxaparin (LOVENOX) injection  40  mg Subcutaneous QPM   folic acid  1 mg Oral Daily   gemfibrozil  600 mg Oral BID AC   guaiFENesin  600 mg Oral BID   insulin aspart  0-5 Units Subcutaneous QHS   insulin aspart  0-9 Units Subcutaneous TID WC   insulin glargine-yfgn  7 Units Subcutaneous QHS   Interferon Beta-1b  0.25 mg Subcutaneous QODAY   ipratropium-albuterol  3 mL Nebulization Q6H   iron polysaccharides  150 mg Oral Daily   loratadine  10 mg Oral Daily   methylPREDNISolone (SOLU-MEDROL) injection  40 mg Intravenous Q12H   nystatin   Topical TID   pantoprazole  40 mg Oral Daily   pregabalin  200 mg Oral BID   sertraline  25 mg Oral Daily   simvastatin  40 mg Oral q1800   sodium chloride  2 g Oral TID WC   tamsulosin  0.4 mg Oral QPC supper   Vitamin D (Ergocalciferol)  50,000 Units Oral Q7 days   Continuous Infusions:  furosemide (LASIX) 200 mg in dextrose 5 % 100 mL (2 mg/mL) infusion 4 mg/hr (06/22/23 1517)   PRN Meds:.acetaminophen, albuterol, guaiFENesin-dextromethorphan, hydrALAZINE, ondansetron (ZOFRAN) IV, traMADol Medications Prior to Admission:  Prior to Admission medications   Medication Sig Start Date End Date Taking? Authorizing Provider  acetaminophen (TYLENOL) 500 MG tablet Take 500 mg by mouth every 6 (six) hours as needed for mild pain.   Yes [provider]  albuterol (VENTOLIN HFA) 108 (90 Base) MCG/ACT inhaler Inhale 2 puffs into the lungs in the morning, at noon, in the evening, and at bedtime.   Yes [provider]  cyanocobalamin (VITAMIN B12) 500 MCG tablet Take 1,000 mcg by mouth daily.   Yes [provider]  furosemide (LASIX) 40 MG tablet Take 1 tablet (40 mg total) by mouth every other day. 1/2 pill every other day- Thedore Mins 05/20/23  Yes Enedina Finner, MD  gemfibrozil (LOPID) 600 MG tablet TAKE 1 TABLET BY MOUTH TWICE  DAILY BEFORE MEALS 05/03/23  Yes Duanne Limerick, MD  insulin glargine (LANTUS) 100 UNIT/ML Solostar Pen Inject 10 Units into the skin daily. 05/20/23   Yes Enedina Finner, MD  Interferon Beta-1b (BETASERON) 0.3 MG KIT injection Inject subcutaneously 1 syringe every other day. Patient taking differently: Inject 0.25 mg into the skin every other day. Inject subcutaneously 1 syringe every other day. 08/25/22  Yes Levert Feinstein, MD  lidocaine (LIDODERM) 5 % Place 1 patch onto the skin daily. Remove & Discard patch within 12 hours or as directed by MD 05/21/23  Yes Enedina Finner, MD  loratadine (CLARITIN) 10 MG tablet Take 1 tablet (10 mg total) by mouth daily. 12/31/22  Yes Duanne Limerick, MD  mupirocin ointment (BACTROBAN) 2 % Apply 1 Application topically 2 (two) times daily. Apply to effected area bid 07/02/22  Yes Duanne Limerick, MD  nystatin (MYCOSTATIN/NYSTOP) powder Apply topically 3 (three) times daily. 05/20/23  Yes Enedina Finner, MD  omeprazole (PRILOSEC) 40 MG capsule Take 1 capsule (40 mg total) by mouth daily. 05/20/23  Yes Enedina Finner, MD  pregabalin (LYRICA) 200 MG capsule Take 1 capsule (200 mg total) by mouth 2 (two) times daily. 12/30/22  Yes Edward Jolly, MD  promethazine-dextromethorphan (PROMETHAZINE-DM) 6.25-15 MG/5ML syrup Take 5 mLs by mouth 4 (four) times daily as needed for cough. 06/02/23  Yes Duanne Limerick, MD  sertraline (ZOLOFT) 50 MG tablet TAKE ONE-HALF TABLET BY MOUTH  DAILY 05/03/23  Yes Duanne Limerick, MD  simvastatin (ZOCOR) 40 MG tablet Take 1 tablet (40 mg total) by mouth daily. 12/31/22  Yes Duanne Limerick, MD  tamsulosin (FLOMAX) 0.4 MG CAPS capsule Take 1 capsule (0.4 mg total) by mouth daily after supper. 05/20/23  Yes Enedina Finner, MD  traMADol (ULTRAM) 50 MG tablet Take 1  tablet (50 mg total) by mouth every 6 (six) hours as needed. 04/08/23 07/07/23 Yes Edward Jolly, MD   Allergies  Allergen Reactions   Betadine [Povidone Iodine]    Review of Systems  Constitutional:  Positive for activity change.  Neurological:  Positive for weakness.    Physical Exam Pulmonary:     Effort: Pulmonary effort is normal.  Neurological:      Mental Status: He is alert.     Vital Signs: BP (!) 134/54   Pulse 83   Temp 97.7 F (36.5 C)   Resp 15   Ht 5\' 10"  (1.778 m)   Wt 102.1 kg   SpO2 94%   BMI 32.30 kg/m  Pain Scale: 0-10   Pain Score: Asleep   SpO2: SpO2: 94 % O2 Device:SpO2: 94 % O2 Flow Rate: .O2 Flow Rate (L/min): 4 L/min  IO: Intake/output summary:  Intake/Output Summary (Last 24 hours) at 06/22/2023 1605 Last data filed at 06/22/2023 1300 Gross per 24 hour  Intake 240 ml  Output 1600 ml  Net -1360 ml    LBM: Last BM Date : 06/22/23 Baseline Weight: Weight: 102.4 kg Most recent weight: Weight: 102.1 kg       Signed by: Morton Stall, NP   Please contact Palliative Medicine Team phone at [phone] for questions and concerns.  For individual provider: See Loretha Stapler

## 2023-06-23 ENCOUNTER — Ambulatory Visit: Payer: Self-pay | Admitting: *Deleted

## 2023-06-23 DIAGNOSIS — J9621 Acute and chronic respiratory failure with hypoxia: Secondary | ICD-10-CM | POA: Diagnosis not present

## 2023-06-23 LAB — CBC
HCT: 19 % — ABNORMAL LOW (ref 39.0–52.0)
Hemoglobin: 6.3 g/dL — ABNORMAL LOW (ref 13.0–17.0)
MCH: 28 pg (ref 26.0–34.0)
MCHC: 33.2 g/dL (ref 30.0–36.0)
MCV: 84.4 fL (ref 80.0–100.0)
Platelets: 278 10*3/uL (ref 150–400)
RBC: 2.25 MIL/uL — ABNORMAL LOW (ref 4.22–5.81)
RDW: 17.2 % — ABNORMAL HIGH (ref 11.5–15.5)
WBC: 5.8 10*3/uL (ref 4.0–10.5)
nRBC: 0 % (ref 0.0–0.2)

## 2023-06-23 LAB — BASIC METABOLIC PANEL
Anion gap: 9 (ref 5–15)
Anion gap: 12 (ref 5–15)
BUN: 48 mg/dL — ABNORMAL HIGH (ref 8–23)
BUN: 54 mg/dL — ABNORMAL HIGH (ref 8–23)
CO2: 26 mmol/L (ref 22–32)
CO2: 23 mmol/L (ref 22–32)
Calcium: 7.9 mg/dL — ABNORMAL LOW (ref 8.9–10.3)
Calcium: 8 mg/dL — ABNORMAL LOW (ref 8.9–10.3)
Chloride: 90 mmol/L — ABNORMAL LOW (ref 98–111)
Chloride: 93 mmol/L — ABNORMAL LOW (ref 98–111)
Creatinine, Ser: 1.44 mg/dL — ABNORMAL HIGH (ref 0.61–1.24)
Creatinine, Ser: 1.42 mg/dL — ABNORMAL HIGH (ref 0.61–1.24)
GFR, Estimated: 52 mL/min — ABNORMAL LOW (ref >60)
GFR, Estimated: 53 mL/min — ABNORMAL LOW (ref >60)
Glucose, Bld: 148 mg/dL — ABNORMAL HIGH (ref 70–99)
Glucose, Bld: 192 mg/dL — ABNORMAL HIGH (ref 70–99)
Potassium: 4.3 mmol/L (ref 3.5–5.1)
Potassium: 4.1 mmol/L (ref 3.5–5.1)
Sodium: 125 mmol/L — ABNORMAL LOW (ref 135–145)
Sodium: 128 mmol/L — ABNORMAL LOW (ref 135–145)

## 2023-06-23 LAB — GLUCOSE, CAPILLARY
Glucose-Capillary: 139 mg/dL — ABNORMAL HIGH (ref 70–99)
Glucose-Capillary: 224 mg/dL — ABNORMAL HIGH (ref 70–99)
Glucose-Capillary: 189 mg/dL — ABNORMAL HIGH (ref 70–99)
Glucose-Capillary: 157 mg/dL — ABNORMAL HIGH (ref 70–99)

## 2023-06-23 LAB — MAGNESIUM: Magnesium: 2 mg/dL (ref 1.7–2.4)

## 2023-06-23 LAB — HEMOGLOBIN AND HEMATOCRIT, BLOOD
HCT: 22.1 % — ABNORMAL LOW (ref 39.0–52.0)
Hemoglobin: 7.3 g/dL — ABNORMAL LOW (ref 13.0–17.0)

## 2023-06-23 LAB — PREPARE RBC (CROSSMATCH)

## 2023-06-23 LAB — PHOSPHORUS: Phosphorus: 2.8 mg/dL (ref 2.5–4.6)

## 2023-06-23 MED ORDER — SODIUM CHLORIDE 0.9% IV SOLUTION: Freq: Once | INTRAVENOUS | Status: AC

## 2023-06-23 MED ORDER — IPRATROPIUM-ALBUTEROL 0.5-2.5 (3) MG/3ML IN SOLN
3.0000 mL | Freq: Three times a day (TID) | RESPIRATORY_TRACT | Status: DC
  Administered 2023-06-23 – 2023-06-25 (×6): 3 mL via RESPIRATORY_TRACT
  Filled 2023-06-23 (×6): qty 3

## 2023-06-23 NOTE — Patient Outreach (Signed)
  Care Coordination   Follow Up Visit Note   06/23/2023 Name: Jeremy [name] MRN: [id number] DOB: 1951/05/26  Jeremy [name] is a 72 y.o. year old male who sees Duanne Limerick, MD for primary care.  What matters to the patients health and wellness today? Noted patient readmitted to hospital on 8/27, hospital liaisons notified.     Goals Addressed             This Visit's Progress    Effective management of MS   Not on track    Interventions Today    Flowsheet Row Most Recent Value  Chronic Disease   Chronic disease during today's visit Congestive Heart Failure (CHF), Other  [MS]  General Interventions   General Interventions Discussed/Reviewed Communication with  Communication with RN  [Hospital laisions notified of patients readmission]               SDOH assessments and interventions completed:  No     Care Coordination Interventions:  Yes, provided   Follow up plan:  pending hospital discharge    Encounter Outcome:  Pt. Visit Completed   Kemper Durie, RN, MSN, [facility] Care Management Care Management Coordinator [phone]

## 2023-06-23 NOTE — Progress Notes (Signed)
[facility] Progress Note  Patient: Jeremy [name]    FUX:[id number]  DOA: 06/21/2023     Date of Service: the patient was seen and examined on 06/23/2023  Chief Complaint  Patient presents with   Shortness of Breath   Brief hospital course: [name] is a 72 y.o. male with medical history significant of HTN, HLD, DM, chronic respiratory failure, COPD on 4L O2, dCHF, GIB, depression, MS (wheelchair-bound), pulmonary hypertension, chronic pain, liver cirrhosis, obesity, anemia, who presents with SOB.   Patient was recently hospitalized from 7/12 - 7/26 due to CHF exacerbation and right lobar pneumonia. Patient states that he has worsening shortness of breath in the past several days, which has been progressively worsening.  Patient has cough with clear thick mucus production.  Denies chest pain, fever or chills.  Patient also has worsening bilateral leg edema and 13 pounds of weight gain recently.   Patient is normally using 4L of oxygen, but was found to have severe respiratory distress, using accessory muscle for breathing in ED, oxygen saturation 88% on 5 L oxygen.  Patient was given 1 dose of Lasix to 40 mg, 125 mg of Solu-Medrol and nebulizer treatment in ED. He feels better.  Currently on 8 L of oxygen with 98% of saturation.  Patient had urinary retention with Foley catheter placed recently.  After removal of Foley catheter, patient had bladder scan in PCPs office 2 weeks ago, which showed urinary retention, so Foley catheter was placed again.    Data reviewed independently and ED Course: pt was found to have BMP for 65.9, troponin level 21 --> 20, lactic acid of 9.6, WBC 9.1, INR 1.3, sodium 120, stable renal function, temperature normal, blood pressure 125/49, heart rate 80s.  VBG with pH 7.42, CO2 46, O2 70.8.  Chest x-ray showed moderate right pleural effusion.  Patient is admitted to stepdown as inpatient.     Assessment and Plan:  # Acute on chronic hypoxic respiratory  failure most likely due to CHF exacerbation Patient is on 4 L oxygen at baseline Continue supplemental O2 admission and gradually wean down   # Diastolic CHF exacerbation Patient presented with shortness of breath and anasarca lower extremity edema S/p Lasix 40 mg IV twice daily Started Lasix IV infusion Monitor intake output and daily body weight Continue fluid restriction 1200 L/day  # COPD exacerbation, most likely due to volume overload S/p Solu-Medrol 125 mg given in the ED,  s/p Solu-Medrol 40 mg IV BID, DC'd on 8/29  continue doxycycline 100 mg p.o. twice daily Started Mucinex 600 mg p.o. twice daily Continue Robitussin DM as needed   # Pleural effusion Moderate right pleural effusion most likely due to CHF, liver cirrhosis with hypoalbuminemia Continue diuresis as above If no improvement in shortness of breath then patient may need right thoracentesis, Will follow-up with patient again for thoracentesis tomorrow a.m.  IDDM T2 Held home regimen for now Continue Semglee 7 units nightly NovoLog sliding scale Monitor CBG, continue diabetic diet Risk of hyperglycemia due to steroids   Essential hypertension -IV hydralazine as needed -Patient is on IV Lasix   Hyperlipidemia -Zocor and lopid   Multiple sclerosis (HCC) -pt is on Betaseron injection -Fall precaution   Myocardial injury: troponin level 21 --> -20.  No chest pain -Continue Zocor   Stage 3a chronic kidney disease (HCC): Stable.  Recent baseline 0.90 1.2-1.4.  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  As per patient's  wife patient cannot have more procedures done due to respiratory failure and he may not wake up.  Patient and his wife would prefer conservative management.  -Follow-up with CBC  Folic acid deficiency, folate level 5.2 Started folate supplement.  Hypotonic hyponatremia: Serum osmolality 265 low Na 120.  Mental status normal. Sodium chloride 2 g p.o. 3 times daily for 5 days Continue fluid restriction 1200 ml/d Na 120---128  Check BMP every 12 hourly    # Cirrhosis of liver without ascites (HCC): Mental status normal.  INR 1.3 -ammonia level 26 wnl -INR 1.3, PT 16.7, PTT 42, slightly elevated    # Chronic urinary retention: Has a Foley catheter in place -Flomax   # Depression -Continue home medication, Zoloft  Vitamin D deficiency: started vitamin D 50,000 units p.o. weekly, follow with PCP to repeat vitamin D level after 3 to 6 months.  # Obesity Body mass index is 32.61 kg/m.  Interventions:  Diet: Heart healthy/carb modified, fluid striction 1200 mL/day DVT Prophylaxis: Subcutaneous Lovenox   Advance goals of care discussion: Full code  Family Communication: family was present at bedside, at the time of interview.  The pt provided permission to discuss medical plan with the family. Opportunity was given to ask question and all questions were answered satisfactorily.   Disposition:  Pt is from home, admitted with hyponatremia, anasarca, diastolic CHF, acute on chronic hypoxic respiratory failure, started IV Lasix infusion, which precludes a safe discharge. Discharge to SNF, when stable.  Subjective: No significant events overnight, patient's shortness of breath is stable, not getting worse.  He still has significant lower extremity edema, anasarca.  Denies any bleeding, agreed for 1 unit of PRBC transfusion today. Possible thoracentesis tomorrow a.m.    Physical Exam: General: Mild to moderate respiratory distress Appear in mild distress, affect appropriate  depressed Eyes: PERRLA ENT: Oral Mucosa Clear, moist  Neck: no JVD,  Cardiovascular: S1 and S2 Present, no Murmur,  Respiratory: Equal air entry bilaterally, bilateral crackles and decreased breath sounds in the bibasilar area more decreased on the right lower area due to pleural effusion Abdomen: Bowel Sound present, Soft and no tenderness,  Skin: no rashes Extremities: 4+ edema, anasarca, no calf tenderness Neurologic: without any new focal findings Gait not checked due to patient safety concerns  Vitals:   06/23/23 1203 06/23/23 1219 06/23/23 1344 06/23/23 1501  BP: (!) 127/45 (!) 119/46  (!) 134/51  Pulse: 85 83  85  Resp: 16 16  16   Temp: 98.1 F (36.7 C) 98.2 F (36.8 C)  97.9 F (36.6 C)  TempSrc: Oral Oral  Oral  SpO2: 95% 95% 95% 97%  Weight:      Height:        Intake/Output Summary (Last 24 hours) at 06/23/2023 1531 Last data filed at 06/23/2023 1501 Gross per 24 hour  Intake 598.27 ml  Output 1000 ml  Net -401.73 ml   Filed Weights   06/21/23 2010 06/22/23 0443 06/23/23 0706  Weight: 102.1 kg 102.1 kg 103.1 kg    Data Reviewed: I have personally reviewed and interpreted daily labs, tele strips, imagings as discussed above. I reviewed all nursing notes,  pharmacy notes, vitals, pertinent old records I have discussed plan of care as described above with RN and patient/family.  CBC: Recent Labs  Lab 06/20/23 1330 06/21/23 1504 06/22/23 0619 06/23/23 0531  WBC 8.5 9.1 3.8* 5.8  NEUTROABS 7.0 7.1  --   --   HGB 8.7* 8.8* 7.4* 6.3*  HCT 26.8* 26.7* 22.4* 19.0*  MCV 86.7 86.1 85.5 84.4  PLT 286 326 264 278   Basic Metabolic Panel: Recent Labs  Lab 06/21/23 1850 06/22/23 0026 06/22/23 0619 06/22/23 1649 06/23/23 0531  NA 120* 122* 122* 126* 125*  K 4.6 4.8 4.6 4.4 4.3  CL 86* 87* 88* 93* 90*  CO2 24 25 25 24 26   GLUCOSE 109* 170* 162* 197* 148*  BUN 31* 32* 34* 39* 48*  CREATININE 1.27* 1.30* 1.30* 1.30* 1.44*  CALCIUM 7.9* 7.8* 7.6* 7.9* 7.9*   MG 1.9  --  1.9  --  2.0  PHOS  --   --  2.9  --  2.8    Studies: No results found.  Scheduled Meds:  vitamin C  500 mg Oral Daily   Chlorhexidine Gluconate Cloth  6 each Topical Daily   doxycycline  100 mg Oral Q12H   folic acid  1 mg Oral Daily   gemfibrozil  600 mg Oral BID AC   guaiFENesin  600 mg Oral BID   insulin aspart  0-5 Units Subcutaneous QHS   insulin aspart  0-9 Units Subcutaneous TID WC   insulin glargine-yfgn  7 Units Subcutaneous QHS   Interferon Beta-1b  0.25 mg Subcutaneous QODAY   ipratropium-albuterol  3 mL Nebulization TID   iron polysaccharides  150 mg Oral Daily   loratadine  10 mg Oral Daily   nystatin   Topical TID   pantoprazole  40 mg Oral Daily   pregabalin  200 mg Oral BID   sertraline  25 mg Oral Daily   simvastatin  40 mg Oral q1800   sodium chloride  2 g Oral TID WC   tamsulosin  0.4 mg Oral QPC supper   Vitamin D (Ergocalciferol)  50,000 Units Oral Q7 days   Continuous Infusions:  furosemide (LASIX) 200 mg in dextrose 5 % 100 mL (2 mg/mL) infusion 4 mg/hr (06/23/23 0555)   PRN Meds: acetaminophen, albuterol, guaiFENesin-dextromethorphan, hydrALAZINE, ondansetron (ZOFRAN) IV, traMADol  Time spent: 55 minutes  Author: Gillis Santa. MD [facility] 06/23/2023 3:31 PM  To reach On-call, see care teams to locate the attending and reach out to them via [email or web address]. If 7PM-7AM, please contact night-coverage If you still have difficulty reaching the attending provider, please page the [facility] (Director on Call) for [facility] on amion for assistance.

## 2023-06-23 NOTE — Progress Notes (Addendum)
Pt blood sugar at 157 but glucometer not sinking yet. Pt refused nystatin powder but was educated about its importance.Will continue to monitor.

## 2023-06-23 NOTE — Plan of Care (Signed)
  Problem: Education: Goal: Ability to describe self-care measures that may prevent or decrease complications (Diabetes Survival Skills Education) will improve Outcome: Progressing Goal: Individualized Educational Video(s) Outcome: Progressing   Problem: Coping: Goal: Ability to adjust to condition or change in health will improve Outcome: Progressing   Problem: Fluid Volume: Goal: Ability to maintain a balanced intake and output will improve Outcome: Progressing   Problem: Health Behavior/Discharge Planning: Goal: Ability to identify and utilize available resources and services will improve Outcome: Progressing Goal: Ability to manage health-related needs will improve Outcome: Progressing   Problem: Metabolic: Goal: Ability to maintain appropriate glucose levels will improve Outcome: Progressing   Problem: Nutritional: Goal: Maintenance of adequate nutrition will improve Outcome: Progressing Goal: Progress toward achieving an optimal weight will improve Outcome: Progressing   Problem: Skin Integrity: Goal: Risk for impaired skin integrity will decrease Outcome: Progressing   Problem: Tissue Perfusion: Goal: Adequacy of tissue perfusion will improve Outcome: Progressing   Problem: Education: Goal: Ability to demonstrate management of disease process will improve Outcome: Progressing Goal: Ability to verbalize understanding of medication therapies will improve Outcome: Progressing Goal: Individualized Educational Video(s) Outcome: Progressing   Problem: Activity: Goal: Capacity to carry out activities will improve Outcome: Progressing   Problem: Cardiac: Goal: Ability to achieve and maintain adequate cardiopulmonary perfusion will improve Outcome: Progressing   Problem: Education: Goal: Knowledge of disease or condition will improve Outcome: Progressing Goal: Knowledge of the prescribed therapeutic regimen will improve Outcome: Progressing Goal:  Individualized Educational Video(s) Outcome: Progressing   Problem: Activity: Goal: Ability to tolerate increased activity will improve Outcome: Progressing Goal: Will verbalize the importance of balancing activity with adequate rest periods Outcome: Progressing   Problem: Respiratory: Goal: Ability to maintain a clear airway will improve Outcome: Progressing Goal: Levels of oxygenation will improve Outcome: Progressing Goal: Ability to maintain adequate ventilation will improve Outcome: Progressing   Problem: Activity: Goal: Ability to tolerate increased activity will improve Outcome: Progressing   Problem: Clinical Measurements: Goal: Ability to maintain a body temperature in the normal range will improve Outcome: Progressing   Problem: Respiratory: Goal: Ability to maintain adequate ventilation will improve Outcome: Progressing Goal: Ability to maintain a clear airway will improve Outcome: Progressing   Problem: Education: Goal: Knowledge of General Education information will improve Description: Including pain rating scale, medication(s)/side effects and non-pharmacologic comfort measures Outcome: Progressing   Problem: Health Behavior/Discharge Planning: Goal: Ability to manage health-related needs will improve Outcome: Progressing   Problem: Clinical Measurements: Goal: Ability to maintain clinical measurements within normal limits will improve Outcome: Progressing Goal: Will remain free from infection Outcome: Progressing Goal: Diagnostic test results will improve Outcome: Progressing Goal: Respiratory complications will improve Outcome: Progressing Goal: Cardiovascular complication will be avoided Outcome: Progressing   Problem: Activity: Goal: Risk for activity intolerance will decrease Outcome: Progressing   Problem: Nutrition: Goal: Adequate nutrition will be maintained Outcome: Progressing   Problem: Coping: Goal: Level of anxiety will  decrease Outcome: Progressing   Problem: Elimination: Goal: Will not experience complications related to bowel motility Outcome: Progressing Goal: Will not experience complications related to urinary retention Outcome: Progressing   Problem: Pain Managment: Goal: General experience of comfort will improve Outcome: Progressing   Problem: Safety: Goal: Ability to remain free from injury will improve Outcome: Progressing   Problem: Skin Integrity: Goal: Risk for impaired skin integrity will decrease Outcome: Progressing

## 2023-06-23 NOTE — Telephone Encounter (Signed)
Requested medication (s) are due for refill today: Yes  Requested medication (s) are on the active medication list: Yes  Last refill:  12/31/22  Future visit scheduled: Yes  Notes to clinic:  Unable to refill per protocol due to failed labs, no updated results.      Requested Prescriptions  Pending Prescriptions Disp Refills   simvastatin (ZOCOR) 40 MG tablet [Pharmacy Med Name: Simvastatin 40 MG Oral Tablet] 90 tablet 3    Sig: TAKE 1 TABLET BY MOUTH DAILY     Cardiovascular:  Antilipid - Statins Failed - 06/22/2023 11:03 PM      Failed - Lipid Panel in normal range within the last 12 months    Cholesterol, Total  Date Value Ref Range Status  12/25/2020 109 100 - 199 mg/dL Final   Cholesterol  Date Value Ref Range Status  07/10/2021 103 0 - 200 mg/dL Final   LDL Chol Calc (NIH)  Date Value Ref Range Status  12/25/2020 50 0 - 99 mg/dL Final   LDL Cholesterol  Date Value Ref Range Status  07/10/2021 35 0 - 99 mg/dL Final    Comment:           Total Cholesterol/HDL:CHD Risk Coronary Heart Disease Risk Table                     Men   Women  1/2 Average Risk   3.4   3.3  Average Risk       5.0   4.4  2 X Average Risk   9.6   7.1  3 X Average Risk  23.4   11.0        Use the calculated Patient Ratio above and the CHD Risk Table to determine the patient's CHD Risk.        ATP III CLASSIFICATION (LDL):  <100     mg/dL   Optimal  409-811  mg/dL   Near or Above                    Optimal  130-159  mg/dL   Borderline  914-782  mg/dL   High  >956     mg/dL   Very High Performed at [facility], [address], [place], [place] [address]    HDL  Date Value Ref Range Status  07/10/2021 22 (L) >40 mg/dL Final  65/78/4696 26 (L) >39 mg/dL Final   Triglycerides  Date Value Ref Range Status  07/10/2021 228 (H) <150 mg/dL Final         Passed - Patient is not pregnant      Passed - Valid encounter within last 12 months    Recent Outpatient Visits            2 days ago Shortness of breath   [place] [facility]   3 weeks ago Pneumonia of right lower lobe due to infectious organism   [facility]   5 months ago Mixed hyperlipidemia   [place] [facility]   6 months ago Gastrointestinal hemorrhage, unspecified gastrointestinal hemorrhage type   [facility]   7 months ago Acute cough   [facility] at [facility],  Vanita Panda, MD       Future Appointments             In 6 days McGowan, Elana Alm [facility] [place]   In 6 days McGowan, Elana Alm [facility] [place]   In 2 weeks Duanne Limerick, MD [facility] at [facility], [facility]

## 2023-06-23 NOTE — Inpatient Diabetes Management (Signed)
Inpatient Diabetes Program Recommendations  AACE/ADA: New Consensus Statement on Inpatient Glycemic Control (2015)  Target Ranges:  Prepandial:   less than 140 mg/dL      Peak postprandial:   less than 180 mg/dL (1-2 hours)      Critically ill patients:  140 - 180 mg/dL   Lab Results  Component Value Date   GLUCAP 224 (H) 06/23/2023   HGBA1C 4.4 (L) 11/24/2022    Review of Glycemic Control  Latest Reference Range & Units 06/22/23 11:14 06/22/23 17:12 06/22/23 21:18 06/23/23 07:43 06/23/23 11:30  Glucose-Capillary 70 - 99 mg/dL 829 (H) 562 (H) 130 (H) 139 (H) 224 (H)   Diabetes history: DM 2 Outpatient Diabetes medications:  Lantus 10 units daily Current orders for Inpatient glycemic control:  Novolog 0-9 units tid with meals and HS Semglee 7 units daily  Inpatient Diabetes Program Recommendations:    Consider increasing Semglee to 10 units daily.   Thanks,  Beryl Meager, RN, BC-ADM Inpatient Diabetes Coordinator Pager [phone]  (8a-5p)

## 2023-06-23 NOTE — Consult Note (Signed)
   Value-Based Care Institute  [facility] Inpatient Consult   06/23/2023  Jeremy [name] 1951-10-20 [id number]  [facility] [[facility]]  Accountable Care Organization [ACO] Patient: Medicare ACO REACH  Referral:  In basket request ffrom RN CC or follow up of patient active with RN Care Coordinator, patient noted with extreme high risk score for unplanned readmission risk  [facility] Liaison remote coverage for Jeremy [name] review for patient admitted to [facility]    Primary Care Provider:  Duanne Limerick, MD with [facility]   Patient is currently active with [facility] [[facility]] Care Management for care coordination services.  Patient has been engaged by a Charity fundraiser Our community based plan of care has focused on disease management and community resource support.  Patient's electronic medical record was reviewed for post hospital recommendations and needs.  PT/OT are recommending SNF, however, patient and wife would prefer home with home health. Spoke with wife via phone as patient is resting, Jeremy [name], listed on DPR and provided HIPAA. Explained reason for follow up call and she states ongoing follow up with [facility] and post hospital as she is able to care for him and has a handicap Jeremy [name] to transport home as long as he can get from the wheelchair into the [place].  She states no other needs and feels like with HH set up she would be able to handle everything.  She states knows who to contact if needs arises.   Plan: Continue to follow and update Rn Care coordinator of any new needs or changes in disposition.   Of note, [facility] Care Management services does not replace or interfere with any services that are needed or arranged by inpatient [facility] care management team.   For additional questions or referrals please contact:  Jeremy Shanks, RN BSN CCM [facility]  [phone] business mobile  phone Toll free office [phone]  Fax number: [phone] [name]

## 2023-06-23 NOTE — Progress Notes (Signed)
PT Cancellation Note  Patient Details Name: Jeremy [name] MRN: [id number] DOB: Jun 09, 1951   Cancelled Treatment:    Reason Eval/Treat Not Completed: Other (comment). Pt currently with 6.3 Hgb with plans for blood transfusion this date. Will hold off on exertional mobility at this time.   Jeremy [name] 06/23/2023, 11:42 AM Elizabeth Palau, PT, DPT, GCS [phone]

## 2023-06-24 ENCOUNTER — Inpatient Hospital Stay: Payer: Medicare Other

## 2023-06-24 DIAGNOSIS — J9621 Acute and chronic respiratory failure with hypoxia: Secondary | ICD-10-CM | POA: Diagnosis not present

## 2023-06-24 LAB — BASIC METABOLIC PANEL
Anion gap: 16 — ABNORMAL HIGH (ref 5–15)
BUN: 57 mg/dL — ABNORMAL HIGH (ref 8–23)
CO2: 25 mmol/L (ref 22–32)
Calcium: 8.9 mg/dL (ref 8.9–10.3)
Chloride: 94 mmol/L — ABNORMAL LOW (ref 98–111)
Creatinine, Ser: 1.53 mg/dL — ABNORMAL HIGH (ref 0.61–1.24)
GFR, Estimated: 48 mL/min — ABNORMAL LOW (ref >60)
Glucose, Bld: 104 mg/dL — ABNORMAL HIGH (ref 70–99)
Potassium: 3.9 mmol/L (ref 3.5–5.1)
Sodium: 132 mmol/L — ABNORMAL LOW (ref 135–145)

## 2023-06-24 LAB — HEPATIC FUNCTION PANEL
ALT: 12 U/L (ref 0–44)
AST: 21 U/L (ref 15–41)
Albumin: 1.9 g/dL — ABNORMAL LOW (ref 3.5–5.0)
Alkaline Phosphatase: 61 U/L (ref 38–126)
Bilirubin, Direct: 0.2 mg/dL (ref 0.0–0.2)
Indirect Bilirubin: 0.6 mg/dL (ref 0.3–0.9)
Total Bilirubin: 0.8 mg/dL (ref 0.3–1.2)
Total Protein: 5.5 g/dL — ABNORMAL LOW (ref 6.5–8.1)

## 2023-06-24 LAB — GLUCOSE, CAPILLARY
Glucose-Capillary: 87 mg/dL (ref 70–99)
Glucose-Capillary: 90 mg/dL (ref 70–99)
Glucose-Capillary: 132 mg/dL — ABNORMAL HIGH (ref 70–99)
Glucose-Capillary: 205 mg/dL — ABNORMAL HIGH (ref 70–99)

## 2023-06-24 LAB — BODY FLUID CELL COUNT WITH DIFFERENTIAL
Eos, Fluid: 3 %
Lymphs, Fluid: 38 %
Monocyte-Macrophage-Serous Fluid: 8 %
Neutrophil Count, Fluid: 49 %
Other Cells, Fluid: 2 %
Total Nucleated Cell Count, Fluid: 239 cu mm

## 2023-06-24 LAB — CBC
HCT: 20.5 % — ABNORMAL LOW (ref 39.0–52.0)
Hemoglobin: 6.9 g/dL — ABNORMAL LOW (ref 13.0–17.0)
MCH: 28 pg (ref 26.0–34.0)
MCHC: 33.7 g/dL (ref 30.0–36.0)
MCV: 83.3 fL (ref 80.0–100.0)
Platelets: 304 10*3/uL (ref 150–400)
RBC: 2.46 MIL/uL — ABNORMAL LOW (ref 4.22–5.81)
RDW: 17.6 % — ABNORMAL HIGH (ref 11.5–15.5)
WBC: 7.1 10*3/uL (ref 4.0–10.5)
nRBC: 0 % (ref 0.0–0.2)

## 2023-06-24 LAB — LACTATE DEHYDROGENASE, PLEURAL OR PERITONEAL FLUID: LD, Fluid: 159 U/L — ABNORMAL HIGH (ref 3–23)

## 2023-06-24 LAB — LACTATE DEHYDROGENASE: LDH: 112 U/L (ref 98–192)

## 2023-06-24 LAB — PREPARE RBC (CROSSMATCH)

## 2023-06-24 LAB — PROTIME-INR
INR: 1.5 — ABNORMAL HIGH (ref 0.8–1.2)
Prothrombin Time: 18.5 s — ABNORMAL HIGH (ref 11.4–15.2)

## 2023-06-24 LAB — PHOSPHORUS: Phosphorus: 2.9 mg/dL (ref 2.5–4.6)

## 2023-06-24 LAB — HEMOGLOBIN AND HEMATOCRIT, BLOOD
HCT: 28.6 % — ABNORMAL LOW (ref 39.0–52.0)
Hemoglobin: 9.2 g/dL — ABNORMAL LOW (ref 13.0–17.0)

## 2023-06-24 LAB — PROTEIN, PLEURAL OR PERITONEAL FLUID: Total protein, fluid: <3 g/dL

## 2023-06-24 LAB — PATHOLOGIST SMEAR REVIEW

## 2023-06-24 LAB — MAGNESIUM: Magnesium: 2 mg/dL (ref 1.7–2.4)

## 2023-06-24 MED ORDER — SODIUM CHLORIDE 0.9% IV SOLUTION: Freq: Once | INTRAVENOUS | Status: DC

## 2023-06-24 MED ORDER — LIDOCAINE HCL (PF) 1 % IJ SOLN
10.0000 mL | Freq: Once | INTRAMUSCULAR | Status: AC
  Administered 2023-06-24: 10 mL via SUBCUTANEOUS
  Filled 2023-06-24: qty 10

## 2023-06-24 MED ORDER — ALBUMIN HUMAN 25 % IV SOLN
12.5000 g | Freq: Four times a day (QID) | INTRAVENOUS | Status: AC
  Administered 2023-06-24 – 2023-06-26 (×8): 12.5 g via INTRAVENOUS
  Filled 2023-06-24 (×8): qty 50

## 2023-06-24 MED ORDER — PANTOPRAZOLE SODIUM 40 MG PO TBEC
40.0000 mg | DELAYED_RELEASE_TABLET | Freq: Two times a day (BID) | ORAL | Status: DC
  Administered 2023-06-24 – 2023-07-05 (×22): 40 mg via ORAL
  Filled 2023-06-24 (×22): qty 1

## 2023-06-24 NOTE — Plan of Care (Signed)
  Problem: Fluid Volume: Goal: Ability to maintain a balanced intake and output will improve Outcome: Progressing   Problem: Metabolic: Goal: Ability to maintain appropriate glucose levels will improve Outcome: Progressing   Problem: Nutritional: Goal: Maintenance of adequate nutrition will improve Outcome: Progressing   Problem: Skin Integrity: Goal: Risk for impaired skin integrity will decrease Outcome: Progressing   Problem: Tissue Perfusion: Goal: Adequacy of tissue perfusion will improve Outcome: Progressing   Problem: Education: Goal: Knowledge of disease or condition will improve Outcome: Progressing Goal: Knowledge of the prescribed therapeutic regimen will improve Outcome: Progressing   Problem: Activity: Goal: Ability to tolerate increased activity will improve Outcome: Progressing Goal: Will verbalize the importance of balancing activity with adequate rest periods Outcome: Progressing

## 2023-06-24 NOTE — Progress Notes (Signed)
[facility]  ROUNDING NOTE   Subjective:   Jeremy [name] is a 72 year old male with past medical conditions including hyperlipidemia, diabetes, hypertension, respiratory failure, COPD with 4 L nasal cannula at baseline, depression, diastolic heart failure, multiple sclerosis, liver cirrhosis, anemia, pulmonary hypertension, and chronic kidney disease stage IIIa.  Patient presents to the emergency department complaining of shortness of breath and has been admitted for Acute on chronic respiratory failure with hypoxia [facility]) [J96.21]  Patient is known to our practice and is followed by Dr. Thedore Mins outpatient.  Patient was last seen in office on 07/01/2022 for routine follow-up.  Patient seen with increased bilateral lower extremity edema and reports 13 pound weight gain.  Denies fever or chills.  Has remained on baseline 4 L nasal cannula however increased to 5 L in emergency department.  Currently on 4 L.  Wife at bedside.  Creatinine 1.25 on ED arrival, now has peaked at 1.53 today.  Patient currently on furosemide drip at 4 mg/h.  Clear yellow urine noted in Foley catheter bag.  Urine output 1.45 L recorded in previous 24 hours.  Chest x-ray on admission showed a moderate right pleural effusion with questionable pneumonia.  Follow-up chest x-ray today shows decreased right pleural effusion without pneumothorax and pulmonary vascular congestion/mild edema.  We have been consulted to monitor kidney function during diuresis    Objective:  Vital signs in last 24 hours:  Temp:  [97.8 F (36.6 C)-98.3 F (36.8 C)] 97.9 F (36.6 C) (08/30 1245) Pulse Rate:  [73-85] 73 (08/30 1217) Resp:  [16-20] 19 (08/30 1245) BP: (112-139)/(49-62) 139/62 (08/30 1245) SpO2:  [92 %-97 %] 92 % (08/30 1245)  Weight change:  Filed Weights   06/21/23 2010 06/22/23 0443 06/23/23 0706  Weight: 102.1 kg 102.1 kg 103.1 kg    Intake/Output: I/O last 3 completed shifts: In: 621.6 [P.O.:240; I.V.:51.6;  Blood:330] Out: 1900 [Urine:1900]   Intake/Output this shift:  Total I/O In: 240 [P.O.:240] Out: -   Physical Exam: General: NAD  Head: Normocephalic, atraumatic. Moist oral mucosal membranes  Eyes: Anicteric  Lungs:  Clear to auscultation, 4 L [place]  Heart: Regular rate and rhythm  Abdomen:  Soft, nontender, nondistended  Extremities: 2+ peripheral edema.  Neurologic: Alert and oriented, moving all four extremities  Skin: No lesions  Access: None    Basic Metabolic Panel: Recent Labs  Lab 06/21/23 1850 06/22/23 0026 06/22/23 0619 06/22/23 1649 06/23/23 0531 06/23/23 1624 06/24/23 0432  NA 120*   < > 122* 126* 125* 128* 132*  K 4.6   < > 4.6 4.4 4.3 4.1 3.9  CL 86*   < > 88* 93* 90* 93* 94*  CO2 24   < > 25 24 26 23 25   GLUCOSE 109*   < > 162* 197* 148* 192* 104*  BUN 31*   < > 34* 39* 48* 54* 57*  CREATININE 1.27*   < > 1.30* 1.30* 1.44* 1.42* 1.53*  CALCIUM 7.9*   < > 7.6* 7.9* 7.9* 8.0* 8.9  MG 1.9  --  1.9  --  2.0  --  2.0  PHOS  --   --  2.9  --  2.8  --  2.9   < > = values in this interval not displayed.    Liver Function Tests: Recent Labs  Lab 06/21/23 1504 06/24/23 0432  AST 27 21  ALT 10 12  ALKPHOS 95 61  BILITOT 0.9 0.8  PROT 6.6 5.5*  ALBUMIN 2.1* 1.9*  No results for input(s): "LIPASE", "AMYLASE" in the last 168 hours. Recent Labs  Lab 06/21/23 2019  AMMONIA 26    CBC: Recent Labs  Lab 06/20/23 1330 06/21/23 1504 06/22/23 0619 06/23/23 0531 06/23/23 1624 06/24/23 0432  WBC 8.5 9.1 3.8* 5.8  --  7.1  NEUTROABS 7.0 7.1  --   --   --   --   HGB 8.7* 8.8* 7.4* 6.3* 7.3* 6.9*  HCT 26.8* 26.7* 22.4* 19.0* 22.1* 20.5*  MCV 86.7 86.1 85.5 84.4  --  83.3  PLT 286 326 264 278  --  304    Cardiac Enzymes: No results for input(s): "CKTOTAL", "CKMB", "CKMBINDEX", "TROPONINI" in the last 168 hours.  BNP: Invalid input(s): "POCBNP"  CBG: Recent Labs  Lab 06/23/23 1130 06/23/23 1535 06/23/23 2144 06/24/23 0811 06/24/23 1223   GLUCAP 224* 189* 157* 87 90    Microbiology: Results for orders placed or performed during the hospital encounter of 06/21/23  Culture, blood (Routine x 2)     Status: None (Preliminary result)   Collection Time: 06/21/23  3:02 PM   Specimen: BLOOD  Result Value Ref Range Status   Specimen Description BLOOD LEFT ANTECUBITAL  Final   Special Requests   Final    BOTTLES DRAWN AEROBIC AND ANAEROBIC Blood Culture adequate volume   Culture   Final    NO GROWTH 3 DAYS Performed at [facility], [address] [place], [place] [address]    Report Status PENDING  Incomplete  Culture, blood (Routine x 2)     Status: None (Preliminary result)   Collection Time: 06/21/23  3:13 PM   Specimen: BLOOD  Result Value Ref Range Status   Specimen Description BLOOD RIGHT ANTECUBITAL  Final   Special Requests   Final    BOTTLES DRAWN AEROBIC AND ANAEROBIC Blood Culture adequate volume   Culture   Final    NO GROWTH 3 DAYS Performed at [facility], [address], [place], [place] [address]    Report Status PENDING  Incomplete  Resp panel by RT-PCR (RSV, Flu A&B, Covid) Anterior Nasal Swab     Status: None   Collection Time: 06/21/23  4:35 PM   Specimen: Anterior Nasal Swab  Result Value Ref Range Status   SARS Coronavirus 2 by RT PCR NEGATIVE NEGATIVE Final    Comment: (NOTE) SARS-CoV-2 target nucleic acids are NOT DETECTED.  The SARS-CoV-2 RNA is generally detectable in upper respiratory specimens during the acute phase of infection. The lowest concentration of SARS-CoV-2 viral copies this assay can detect is 138 copies/mL. A negative result does not preclude SARS-Cov-2 infection and should not be used as the sole basis for treatment or other patient management decisions. A negative result may occur with  improper specimen collection/handling, submission of specimen other than nasopharyngeal swab, presence of viral mutation(s) within the areas targeted by this assay,  and inadequate number of viral copies(<138 copies/mL). A negative result must be combined with clinical observations, patient history, and epidemiological information. The expected result is Negative.  Fact Sheet for Patients:  BloggerCourse.com  Fact Sheet for Healthcare Providers:  SeriousBroker.it  This test is no t yet approved or cleared by the [place] FDA and  has been authorized for detection and/or diagnosis of SARS-CoV-2 by FDA under an Emergency Use Authorization (EUA). This EUA will remain  in effect (meaning this test can be used) for the duration of the COVID-19 declaration under Section 564(b)(1) of the Act, 21 U.S.C.section 360bbb-3(b)(1),  unless the authorization is terminated  or revoked sooner.       Influenza A by PCR NEGATIVE NEGATIVE Final   Influenza B by PCR NEGATIVE NEGATIVE Final    Comment: (NOTE) The Xpert Xpress SARS-CoV-2/FLU/RSV plus assay is intended as an aid in the diagnosis of influenza from Nasopharyngeal swab specimens and should not be used as a sole basis for treatment. Nasal washings and aspirates are unacceptable for Xpert Xpress SARS-CoV-2/FLU/RSV testing.  Fact Sheet for Patients: BloggerCourse.com  Fact Sheet for Healthcare Providers: SeriousBroker.it  This test is not yet approved or cleared by the [place] FDA and has been authorized for detection and/or diagnosis of SARS-CoV-2 by FDA under an Emergency Use Authorization (EUA). This EUA will remain in effect (meaning this test can be used) for the duration of the COVID-19 declaration under Section 564(b)(1) of the Act, 21 U.S.C. section 360bbb-3(b)(1), unless the authorization is terminated or revoked.     Resp Syncytial Virus by PCR NEGATIVE NEGATIVE Final    Comment: (NOTE) Fact Sheet for Patients: BloggerCourse.com  Fact Sheet for  Healthcare Providers: SeriousBroker.it  This test is not yet approved or cleared by the [place] FDA and has been authorized for detection and/or diagnosis of SARS-CoV-2 by FDA under an Emergency Use Authorization (EUA). This EUA will remain in effect (meaning this test can be used) for the duration of the COVID-19 declaration under Section 564(b)(1) of the Act, 21 U.S.C. section 360bbb-3(b)(1), unless the authorization is terminated or revoked.  Performed at [facility], [address] [place], [place] [address]   MRSA Next Gen by PCR, Nasal     Status: Abnormal   Collection Time: 06/21/23  8:14 PM   Specimen: Nasal Mucosa; Nasal Swab  Result Value Ref Range Status   MRSA by PCR Next Gen DETECTED (A) NOT DETECTED Final    Comment: RESULT CALLED TO, READ BACK BY AND VERIFIED WITH: KRISTY SNEEAD AT 2206 ON 06/21/23 BY SS (NOTE) The GeneXpert MRSA Assay (FDA approved for NASAL specimens only), is one component of a comprehensive MRSA colonization surveillance program. It is not intended to diagnose MRSA infection nor to guide or monitor treatment for MRSA infections. Test performance is not FDA approved in patients less than 28 years old. Performed at [facility], [address] [place], [place] [address]   Respiratory (~20 pathogens) panel by PCR     Status: None   Collection Time: 06/22/23  3:22 PM   Specimen: Nasopharyngeal Swab; Respiratory  Result Value Ref Range Status   Adenovirus NOT DETECTED NOT DETECTED Final   Coronavirus 229E NOT DETECTED NOT DETECTED Final    Comment: (NOTE) The Coronavirus on the Respiratory Panel, DOES NOT test for the novel  Coronavirus (2019 nCoV)    Coronavirus HKU1 NOT DETECTED NOT DETECTED Final   Coronavirus NL63 NOT DETECTED NOT DETECTED Final   Coronavirus OC43 NOT DETECTED NOT DETECTED Final   Metapneumovirus NOT DETECTED NOT DETECTED Final   Rhinovirus / Enterovirus NOT DETECTED  NOT DETECTED Final   Influenza A NOT DETECTED NOT DETECTED Final   Influenza B NOT DETECTED NOT DETECTED Final   Parainfluenza Virus 1 NOT DETECTED NOT DETECTED Final   Parainfluenza Virus 2 NOT DETECTED NOT DETECTED Final   Parainfluenza Virus 3 NOT DETECTED NOT DETECTED Final   Parainfluenza Virus 4 NOT DETECTED NOT DETECTED Final   Respiratory Syncytial Virus NOT DETECTED NOT DETECTED Final   Bordetella pertussis NOT DETECTED NOT DETECTED Final   Bordetella  Parapertussis NOT DETECTED NOT DETECTED Final   Chlamydophila pneumoniae NOT DETECTED NOT DETECTED Final   Mycoplasma pneumoniae NOT DETECTED NOT DETECTED Final    Comment: Performed at [facility], [address] [place], [place] [address]    Coagulation Studies: Recent Labs    06/21/23 1513 06/24/23 0432  LABPROT 16.7* 18.5*  INR 1.3* 1.5*    Urinalysis: No results for input(s): "COLORURINE", "LABSPEC", "PHURINE", "GLUCOSEU", "HGBUR", "BILIRUBINUR", "KETONESUR", "PROTEINUR", "UROBILINOGEN", "NITRITE", "LEUKOCYTESUR" in the last 72 hours.  Invalid input(s): "APPERANCEUR"    Imaging: US THORACENTESIS ASP PLEURAL SPACE W/IMG GUIDE  Result Date: 06/24/2023 INDICATION: Right pleural effusion. Request received for diagnostic and therapeutic thoracentesis. EXAM: ULTRASOUND GUIDED RIGHT THORACENTESIS MEDICATIONS: 8 cc 1% lidocaine COMPLICATIONS: None immediate. PROCEDURE: An ultrasound guided thoracentesis was thoroughly discussed with the patient and questions answered. The benefits, risks, alternatives and complications were also discussed. The patient understands and wishes to proceed with the procedure. Written consent was obtained. Ultrasound was performed to localize and mark an adequate pocket of fluid in the RIGHT chest. The area was then prepped and draped in the normal sterile fashion. 1% Lidocaine was used for local anesthesia. Under ultrasound guidance a 6 Fr Safe-T-Centesis catheter was introduced. Thoracentesis  was performed. The catheter was removed and a dressing applied. FINDINGS: A total of approximately 1.2 L of serosanguineous fluid was removed. Samples were sent to the laboratory as requested by the clinical team. IMPRESSION: Successful ultrasound guided diagnostic RIGHT thoracentesis yielding 1.2 L of pleural fluid. Follow-up chest x-ray revealed no evidence of pneumothorax. Procedure performed by Mina Marble, PA-C Electronically Signed   By: Roanna Banning M.D.   On: 06/24/2023 12:57   DG Chest Port 1 View  Result Date: 06/24/2023 CLINICAL DATA:  Status post thoracentesis for right pleural effusion. EXAM: PORTABLE CHEST 1 VIEW COMPARISON:  06/21/2023 FINDINGS: Stable cardiomediastinal contours. The right pleural effusion has decreased in volume from the previous exam. No pneumothorax identified. Diffuse increase interstitial markings are identified bilaterally, similar to previous exam. Nodular opacity within the right upper lobe is again noted measuring 1.3 cm. This is been evaluated with prior PET-CT showing no significant uptake favoring a benign nodule. IMPRESSION: 1. Interval decrease in volume of right pleural effusion. No pneumothorax identified. 2. Increase interstitial markings favored to represent pulmonary vascular congestion/mild edema. Electronically Signed   By: Signa Kell M.D.   On: 06/24/2023 12:01     Medications:    albumin human 12.5 g (06/24/23 1054)   furosemide (LASIX) 200 mg in dextrose 5 % 100 mL (2 mg/mL) infusion 4 mg/hr (06/24/23 0756)    sodium chloride   Intravenous Once   vitamin C  500 mg Oral Daily   Chlorhexidine Gluconate Cloth  6 each Topical Daily   doxycycline  100 mg Oral Q12H   folic acid  1 mg Oral Daily   gemfibrozil  600 mg Oral BID AC   guaiFENesin  600 mg Oral BID   insulin aspart  0-5 Units Subcutaneous QHS   insulin aspart  0-9 Units Subcutaneous TID WC   insulin glargine-yfgn  7 Units Subcutaneous QHS   Interferon Beta-1b  0.25 mg  Subcutaneous QODAY   ipratropium-albuterol  3 mL Nebulization TID   iron polysaccharides  150 mg Oral Daily   loratadine  10 mg Oral Daily   nystatin   Topical TID   pantoprazole  40 mg Oral Daily   pregabalin  200 mg Oral BID   sertraline  25  mg Oral Daily   simvastatin  40 mg Oral q1800   sodium chloride  2 g Oral TID WC   tamsulosin  0.4 mg Oral QPC supper   Vitamin D (Ergocalciferol)  50,000 Units Oral Q7 days   acetaminophen, albuterol, guaiFENesin-dextromethorphan, hydrALAZINE, ondansetron (ZOFRAN) IV, traMADol  Assessment/ Plan:  Mr. Paymon Berkovitz is a 72 y.o.  male with past medical conditions including hyperlipidemia, diabetes, hypertension, respiratory failure, COPD with 4 L nasal cannula at baseline, depression, diastolic heart failure, multiple sclerosis, liver cirrhosis, anemia, pulmonary hypertension, and chronic kidney disease stage IIIa.  Patient presents to the emergency department complaining of shortness of breath and has been admitted for Acute on chronic respiratory failure with hypoxia (HCC) [J96.21]   Chronic kidney disease stage IIIa, patient currently at baseline renal function. Chronic kidney disease is secondary to diabetes, hypertension, advanced age Patient volume overloaded on ED arrival.  Now currently on IV furosemide drip.  Adequate diuresis.  Continue treatment as prescribed.  Lab Results  Component Value Date   CREATININE 1.53 (H) 06/24/2023   CREATININE 1.42 (H) 06/23/2023   CREATININE 1.44 (H) 06/23/2023    Intake/Output Summary (Last 24 hours) at 06/24/2023 1337 Last data filed at 06/24/2023 1054 Gross per 24 hour  Intake 833.34 ml  Output 1450 ml  Net -616.66 ml   2.  Volume overload, appears multifactorial with contributing factors of COPD and CHF exacerbation.  Patient receiving supportive care with Solu-Medrol and diuresis.  Fluid restriction in place.  Symptom management.  3. Anemia of chronic kidney disease Lab Results  Component  Value Date   HGB 6.9 (L) 06/24/2023    Hemoglobin below desired target.  Blood transfusion ordered by primary team.  4.  Diabetes mellitus type II with chronic kidney disease/renal manifestations: insulin dependent. Home regimen includes Lantus. Most recent hemoglobin A1c is 4.4 on 11/24/22.     LOS: 3 Lillyauna Jenkinson 8/30/20241:37 PM

## 2023-06-24 NOTE — Care Management Important Message (Signed)
Important Message  Patient Details  Name: Jeremy [name] MRN: [id number] Date of Birth: Jan 04, 1951   Medicare Important Message Given:  Yes     [name] 06/24/2023, 11:19 AM

## 2023-06-24 NOTE — Plan of Care (Signed)
  Problem: Education: Goal: Knowledge of disease or condition will improve Outcome: Progressing   Problem: Respiratory: Goal: Ability to maintain adequate ventilation will improve Outcome: Progressing   Problem: Pain Managment: Goal: General experience of comfort will improve Outcome: Progressing   Problem: Safety: Goal: Ability to remain free from injury will improve Outcome: Progressing

## 2023-06-24 NOTE — Procedures (Signed)
PROCEDURE SUMMARY:  Successful image-guided right thoracentesis. Yielded 1.2L of serosanguineous fluid. Pt tolerated procedure well. No immediate complications. EBL = trace   Specimen was sent for labs. CXR ordered.  Please see imaging section of Epic for full dictation.  Kennieth Francois PA-C 06/24/2023 12:24 PM

## 2023-06-24 NOTE — Consult Note (Signed)
   [facility] CM Inpatient Consult   06/24/2023  Ahmere Julio 1950-12-02 [id number]  Primary Care Provider:  Franky Macho with [facility] at [facility].  Patient is currently active with Care Management for chronic disease management services.  Patient has been engaged by a Charity fundraiser.  Our community based plan of care has focused on disease management and community resource support.   Patient will receive a post hospital call and will be evaluated for assessments and disease process education.   Plan: pending disposition  Inpatient Transition Of Care [TOC] team member to make aware that Care Management following.  Of note, Care Management services does not replace or interfere with any services that are needed or arranged by inpatient [facility] care management team.   For additional questions or referrals please contact:  Elliot Cousin, RN, [facility] Liaison [place]   Population Health Office Hours MTWF  8:00 am-6:00 pm [phone] mobile [phone] [Office toll free line] Office Hours are M-F 8:30 - 5 pm [email or web address]

## 2023-06-24 NOTE — Progress Notes (Signed)
[facility] Progress Note  Patient: Jeremy [name]    NFA:[id number]  DOA: 06/21/2023     Date of Service: the patient was seen and examined on 06/24/2023  Chief Complaint  Patient presents with   Shortness of Breath   Brief hospital course: Jeremy [name] is a 72 y.o. male with medical history significant of HTN, HLD, DM, chronic respiratory failure, COPD on 4L O2, dCHF, GIB, depression, MS (wheelchair-bound), pulmonary hypertension, chronic pain, liver cirrhosis, obesity, anemia, who presents with SOB.   Patient was recently hospitalized from 7/12 - 7/26 due to CHF exacerbation and right lobar pneumonia. Patient states that he has worsening shortness of breath in the past several days, which has been progressively worsening.  Patient has cough with clear thick mucus production.  Denies chest pain, fever or chills.  Patient also has worsening bilateral leg edema and 13 pounds of weight gain recently.   Patient is normally using 4L of oxygen, but was found to have severe respiratory distress, using accessory muscle for breathing in ED, oxygen saturation 88% on 5 L oxygen.  Patient was given 1 dose of Lasix to 40 mg, 125 mg of Solu-Medrol and nebulizer treatment in ED. He feels better.  Currently on 8 L of oxygen with 98% of saturation.  Patient had urinary retention with Foley catheter placed recently.  After removal of Foley catheter, patient had bladder scan in PCPs office 2 weeks ago, which showed urinary retention, so Foley catheter was placed again.    Data reviewed independently and ED Course: pt was found to have BMP for 65.9, troponin level 21 --> 20, lactic acid of 9.6, WBC 9.1, INR 1.3, sodium 120, stable renal function, temperature normal, blood pressure 125/49, heart rate 80s.  VBG with pH 7.42, CO2 46, O2 70.8.  Chest x-ray showed moderate right pleural effusion.  Patient is admitted to stepdown as inpatient.     Assessment and Plan:  # Acute on chronic hypoxic respiratory  failure most likely due to CHF exacerbation Patient is on 4 L oxygen at baseline Continue supplemental O2 admission and gradually wean down   # Diastolic CHF exacerbation and anasarca with significant lower extremity edema Patient presented with shortness of breath and LE edema S/p Lasix 40 mg IV twice daily Started Lasix IV infusion Monitor intake output and daily body weight Continue fluid restriction 1200 L/day 8/30 started albumin 12.5 g IV Q6 hourly for 2 days  Anemia in chronic kidney disease (CKD), Iron deficiency and folic acid deficiency 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  As per patient's wife patient cannot have more procedures done due to respiratory failure and he may not wake up.  Patient and his wife would prefer conservative management.  8/30 Hb 6.9, 1 unit PRBC transfused. -Follow-up with CBC   # Stage 3a chronic kidney disease  Recent baseline 0.90 1.2-1.4.   creatinine 1.5 slightly elevated due to diuresis Monitor renal function and urine output Started IV albumin as above Nephrology consulted for further recommendation   # COPD exacerbation, most likely due to volume overload S/p Solu-Medrol 125 mg given in the ED,  s/p Solu-Medrol 40 mg IV BID, DC'd on 8/29  S/p  doxycycline 100 mg p.o. twice daily d/c'd on 8/30 Started Mucinex 600 mg p.o.  twice daily Continue Robitussin DM as needed   # Pleural effusion Moderate right pleural effusion most likely due to CHF, liver cirrhosis with hypoalbuminemia Continue diuresis as above 8/30 s/p thoracentesis 1.2 L fluid was tapped by IR, postprocedure x-ray did not show any complications. Fluid studies, as per lights criteria, LDH elevated, consistent with exudative but clinically no  signs of infection.  Monitor off antibiotics for now.   IDDM T2 Held home regimen for now Continue Semglee 7 units nightly NovoLog sliding scale Monitor CBG, continue diabetic diet Risk of hyperglycemia due to steroids   Essential hypertension -IV hydralazine as needed -Patient is on IV Lasix   Hyperlipidemia -Zocor and lopid   Multiple sclerosis (HCC) -pt is on Betaseron injection -Fall precaution   Myocardial injury: troponin level 21 --> -20.  No chest pain -Continue Zocor   Folic acid deficiency, folate level 5.2 Started folate supplement.  Hypotonic hyponatremia: Serum osmolality 265 low Na 120.  Mental status normal. Sodium chloride 2 g p.o. 3 times daily for 5 days Continue fluid restriction 1200 ml/d Na 120---132  Check BMP daily now     # Cirrhosis of liver without ascites (HCC): Mental status normal.  INR 1.3 -ammonia level 26 wnl -INR 1.3, PT 16.7, PTT 42, slightly elevated    # Chronic urinary retention: Has a Foley catheter in place -Flomax   # Depression -Continue home medication, Zoloft  Vitamin D deficiency: started vitamin D 50,000 units p.o. weekly, follow with PCP to repeat vitamin D level after 3 to 6 months.  # Obesity Body mass index is 32.61 kg/m.  Interventions:  Diet: Heart healthy/carb modified, fluid striction 1200 mL/day DVT Prophylaxis: Subcutaneous Lovenox   Advance goals of care discussion: Full code  Family Communication: family was present at bedside, at the time of interview.  The pt provided permission to discuss medical plan with the family. Opportunity was given to ask question and all questions were answered satisfactorily.   Disposition:  Pt is from home, admitted with hyponatremia, anasarca, diastolic CHF, acute on chronic hypoxic respiratory failure, started IV Lasix infusion, which precludes a safe discharge. Discharge to SNF, when stable.  Subjective: No significant events overnight, patient was seen after  thoracentesis, tolerated procedure well.  Denied any worsening of shortness of breath.  Lower extremity edema slightly improving but patient does not feel any difference. Denied any chest pain or palpitation, no any other active issues.   Physical Exam: General: Mild respiratory distress, Laying comfortably. Appear in mild distress, affect appropriate depressed Eyes: PERRLA ENT: Oral Mucosa Clear, moist  Neck: no JVD,  Cardiovascular: S1 and S2 Present, no Murmur,  Respiratory: Equal air entry bilaterally, bilateral crackles and decreased breath sounds in the bibasilar area more decreased on the right lower area due to pleural effusion Abdomen: Bowel Sound present, Soft and no tenderness,  Skin: no rashes Extremities: 4+ edema, anasarca, no calf tenderness, edema gradually improving Neurologic: without any new focal findings Gait not checked due to patient safety concerns  Vitals:   06/24/23 1217 06/24/23 1245 06/24/23 1401 06/24/23 1535  BP: (!) 122/51 139/62  (!) 135/56  Pulse: 73   97  Resp: 18 19  18   Temp: 97.8 F (36.6 C) 97.9 F (36.6 C)  97.9 F (36.6 C)  TempSrc: Oral Oral  Oral  SpO2: 95% 92% 91% 94%  Weight:      Height:        Intake/Output Summary (Last 24 hours) at 06/24/2023 1632 Last data  filed at 06/24/2023 1540 Gross per 24 hour  Intake 1033.34 ml  Output 2350 ml  Net -1316.66 ml   Filed Weights   06/21/23 2010 06/22/23 0443 06/23/23 0706  Weight: 102.1 kg 102.1 kg 103.1 kg    Data Reviewed: I have personally reviewed and interpreted daily labs, tele strips, imagings as discussed above. I reviewed all nursing notes, pharmacy notes, vitals, pertinent old records I have discussed plan of care as described above with RN and patient/family.  CBC: Recent Labs  Lab 06/20/23 1330 06/21/23 1504 06/22/23 0619 06/23/23 0531 06/23/23 1624 06/24/23 0432  WBC 8.5 9.1 3.8* 5.8  --  7.1  NEUTROABS 7.0 7.1  --   --   --   --   HGB 8.7* 8.8* 7.4* 6.3* 7.3*  6.9*  HCT 26.8* 26.7* 22.4* 19.0* 22.1* 20.5*  MCV 86.7 86.1 85.5 84.4  --  83.3  PLT 286 326 264 278  --  304   Basic Metabolic Panel: Recent Labs  Lab 06/21/23 1850 06/22/23 0026 06/22/23 0619 06/22/23 1649 06/23/23 0531 06/23/23 1624 06/24/23 0432  NA 120*   < > 122* 126* 125* 128* 132*  K 4.6   < > 4.6 4.4 4.3 4.1 3.9  CL 86*   < > 88* 93* 90* 93* 94*  CO2 24   < > 25 24 26 23 25   GLUCOSE 109*   < > 162* 197* 148* 192* 104*  BUN 31*   < > 34* 39* 48* 54* 57*  CREATININE 1.27*   < > 1.30* 1.30* 1.44* 1.42* 1.53*  CALCIUM 7.9*   < > 7.6* 7.9* 7.9* 8.0* 8.9  MG 1.9  --  1.9  --  2.0  --  2.0  PHOS  --   --  2.9  --  2.8  --  2.9   < > = values in this interval not displayed.    Studies: US THORACENTESIS ASP PLEURAL SPACE W/IMG GUIDE  Result Date: 06/24/2023 INDICATION: Right pleural effusion. Request received for diagnostic and therapeutic thoracentesis. EXAM: ULTRASOUND GUIDED RIGHT THORACENTESIS MEDICATIONS: 8 cc 1% lidocaine COMPLICATIONS: None immediate. PROCEDURE: An ultrasound guided thoracentesis was thoroughly discussed with the patient and questions answered. The benefits, risks, alternatives and complications were also discussed. The patient understands and wishes to proceed with the procedure. Written consent was obtained. Ultrasound was performed to localize and mark an adequate pocket of fluid in the RIGHT chest. The area was then prepped and draped in the normal sterile fashion. 1% Lidocaine was used for local anesthesia. Under ultrasound guidance a 6 Fr Safe-T-Centesis catheter was introduced. Thoracentesis was performed. The catheter was removed and a dressing applied. FINDINGS: A total of approximately 1.2 L of serosanguineous fluid was removed. Samples were sent to the laboratory as requested by the clinical team. IMPRESSION: Successful ultrasound guided diagnostic RIGHT thoracentesis yielding 1.2 L of pleural fluid. Follow-up chest x-ray revealed no evidence of  pneumothorax. Procedure performed by Mina Marble, PA-C Electronically Signed   By: Roanna Banning M.D.   On: 06/24/2023 12:57   DG Chest Port 1 View  Result Date: 06/24/2023 CLINICAL DATA:  Status post thoracentesis for right pleural effusion. EXAM: PORTABLE CHEST 1 VIEW COMPARISON:  06/21/2023 FINDINGS: Stable cardiomediastinal contours. The right pleural effusion has decreased in volume from the previous exam. No pneumothorax identified. Diffuse increase interstitial markings are identified bilaterally, similar to previous exam. Nodular opacity within the right upper lobe is again noted measuring 1.3 cm. This is been evaluated  with prior PET-CT showing no significant uptake favoring a benign nodule. IMPRESSION: 1. Interval decrease in volume of right pleural effusion. No pneumothorax identified. 2. Increase interstitial markings favored to represent pulmonary vascular congestion/mild edema. Electronically Signed   By: Signa Kell M.D.   On: 06/24/2023 12:01    Scheduled Meds:  sodium chloride   Intravenous Once   vitamin C  500 mg Oral Daily   Chlorhexidine Gluconate Cloth  6 each Topical Daily   folic acid  1 mg Oral Daily   gemfibrozil  600 mg Oral BID AC   guaiFENesin  600 mg Oral BID   insulin aspart  0-5 Units Subcutaneous QHS   insulin aspart  0-9 Units Subcutaneous TID WC   insulin glargine-yfgn  7 Units Subcutaneous QHS   Interferon Beta-1b  0.25 mg Subcutaneous QODAY   ipratropium-albuterol  3 mL Nebulization TID   iron polysaccharides  150 mg Oral Daily   loratadine  10 mg Oral Daily   nystatin   Topical TID   pantoprazole  40 mg Oral Daily   pregabalin  200 mg Oral BID   sertraline  25 mg Oral Daily   simvastatin  40 mg Oral q1800   sodium chloride  2 g Oral TID WC   tamsulosin  0.4 mg Oral QPC supper   Vitamin D (Ergocalciferol)  50,000 Units Oral Q7 days   Continuous Infusions:  albumin human 12.5 g (06/24/23 1623)   furosemide (LASIX) 200 mg in dextrose 5 % 100 mL (2  mg/mL) infusion 4 mg/hr (06/24/23 0756)   PRN Meds: acetaminophen, albuterol, guaiFENesin-dextromethorphan, hydrALAZINE, ondansetron (ZOFRAN) IV, traMADol  Time spent: 55 minutes  Author: Gillis Santa. MD [facility] 06/24/2023 4:32 PM  To reach On-call, see care teams to locate the attending and reach out to them via [email or web address]. If 7PM-7AM, please contact night-coverage If you still have difficulty reaching the attending provider, please page the [facility] (Director on Call) for [facility] on amion for assistance.

## 2023-06-24 NOTE — Progress Notes (Signed)
Physical Therapy Treatment Patient Details Name: Jeremy [name] MRN: [id number] DOB: 08/26/1951 Today's Date: 06/24/2023   History of Present Illness Pt is a 72 year old male presenting to ED with SOB, admitted with acute on chronic respiratory failure due to CHF and COPD exacerbation, pleural effusion     PMH significant for HTN, HLD, DM, chronic respiratory failure, COPD on 4L O2, dCHF, GIB, depression, MS (wheelchair-bound), pulmonary hypertension, chronic pain, liver cirrhosis, obesity, anemia    PT Comments  Noted at 84-85% on 4L supplemental O2 upon arrival to room; boosted in bed and assisted to more upright positioning for chest/lung expansion, educated in pursed lip breathing for recovery to >92% on 4L  Lateral scooting edge of bed (prep for transfer to chair), max assist +2; step-by-step cuing for forward trunk lean, max/dep assist for lift off/lateral movement.  Limited ability to actively assist wtih bilat UE/LEs. Full transfer deferred due to transport arriving  Anticipate continued benefit of mechanical lift for all OOB transfers outside of therapy; extensive +2 for transfers with therapy.     If plan is discharge home, recommend the following: Two people to help with walking and/or transfers;Two people to help with bathing/dressing/bathroom;Assist for transportation;Assistance with feeding;Help with stairs or ramp for entrance   Can travel by private vehicle     No  Equipment Recommendations       Recommendations for Other Services       Precautions / Restrictions Precautions Precautions: Fall Restrictions Weight Bearing Restrictions: No     Mobility  Bed Mobility Overal bed mobility: Needs Assistance Bed Mobility: Supine to Sit     Supine to sit: Max assist, +2 for physical assistance, +2 for safety/equipment, HOB elevated Sit to supine: Max assist, +2 for physical assistance, +2 for safety/equipment   General bed mobility comments: Able to initiate bed  mobility but required max A x2 to complete task; globally weak and edematous throughout all extremities    Transfers                   General transfer comment: deferred due to transport arriving for therapeutic procedure    Ambulation/Gait               General Gait Details: deferred due to transport arriving for therapeutic procedure   Stairs             Wheelchair Mobility     Tilt Bed    Modified Rankin (Stroke Patients Only)       Balance Overall balance assessment: Needs assistance Sitting-balance support: No upper extremity supported, Feet supported Sitting balance-Leahy Scale: Fair Sitting balance - Comments: initial min assist to maintain, progresses/improves to close sup       Standing balance comment: NT due to safety concerns                            Cognition Arousal: Alert Behavior During Therapy: [facility] for tasks assessed/performed                                   General Comments: AOx4 and agreeable to PT session        Exercises Other Exercises Other Exercises: Noted at 84-85% on 4L supplemental O2 upon arrival to room; boosted in bed and assisted to more upright positioning for chest/lung expansion, educated in pursed lip breathing for recovery to >92% on  4L Other Exercises: Lateral scooting edge of bed (prep for transfer to chair), max assist +2; step-by-step cuing for forward trunk lean, max/dep assist for lift off/lateral movement.  Limited ability to actively assist wtih bilat UE/LEs. Full transfer deferred due to transport arriving    General Comments        Pertinent Vitals/Pain Pain Assessment Pain Assessment: No/denies pain    Home Living                          Prior Function            PT Goals (current goals can now be found in the care plan section) Acute Rehab PT Goals Patient Stated Goal: to go home PT Goal Formulation: With patient Time For Goal Achievement:  07/06/23 Potential to Achieve Goals: Good Progress towards PT goals: Progressing toward goals    Frequency    Min 1X/week      PT Plan      Co-evaluation              AM-PAC PT "6 Clicks" Mobility   Outcome Measure  Help needed turning from your back to your side while in a flat bed without using bedrails?: A Lot Help needed moving from lying on your back to sitting on the side of a flat bed without using bedrails?: A Lot Help needed moving to and from a bed to a chair (including a wheelchair)?: Total Help needed standing up from a chair using your arms (e.g., wheelchair or bedside chair)?: Total Help needed to walk in hospital room?: Total Help needed climbing 3-5 steps with a railing? : Total 6 Click Score: 8    End of Session Equipment Utilized During Treatment: Oxygen Activity Tolerance: Patient tolerated treatment well Patient left: in bed;with call bell/phone within reach;with family/visitor present (transport preparing to leave unit) Nurse Communication: Mobility status PT Visit Diagnosis: Unsteadiness on feet (R26.81);Muscle weakness (generalized) (M62.81);Difficulty in walking, not elsewhere classified (R26.2)     Time: 7846-9629 PT Time Calculation (min) (ACUTE ONLY): 22 min  Charges:    $Therapeutic Activity: 8-22 mins PT General Charges $$ ACUTE PT VISIT: 1 Visit                    Sara Selvidge H. Manson Passey, PT, DPT, NCS 06/24/23, 1:53 PM [phone]

## 2023-06-24 NOTE — Progress Notes (Signed)
OT Cancellation Note  Patient Details Name: Jeremy [name] MRN: [id number] DOB: August 24, 1951   Cancelled Treatment:    Reason Eval/Treat Not Completed: Patient at procedure or test/ unavailable (Pt currently at ultrasound per NT, will check back as able)  Lise Auer Sharron Simpson 06/24/2023, 2:39 PM

## 2023-06-25 DIAGNOSIS — J9621 Acute and chronic respiratory failure with hypoxia: Secondary | ICD-10-CM | POA: Diagnosis not present

## 2023-06-25 LAB — BPAM RBC
Blood Product Expiration Date: 202409242359
Blood Product Expiration Date: 202409262359
ISSUE DATE / TIME: 202408291152
ISSUE DATE / TIME: 202408301224
Unit Type and Rh: 6200
Unit Type and Rh: 6200

## 2023-06-25 LAB — HEPATIC FUNCTION PANEL
ALT: 12 U/L (ref 0–44)
AST: 27 U/L (ref 15–41)
Albumin: 2.1 g/dL — ABNORMAL LOW (ref 3.5–5.0)
Alkaline Phosphatase: 57 U/L (ref 38–126)
Bilirubin, Direct: 0.4 mg/dL — ABNORMAL HIGH (ref 0.0–0.2)
Indirect Bilirubin: 0.5 mg/dL (ref 0.3–0.9)
Total Bilirubin: 0.9 mg/dL (ref 0.3–1.2)
Total Protein: 5.1 g/dL — ABNORMAL LOW (ref 6.5–8.1)

## 2023-06-25 LAB — TYPE AND SCREEN
ABO/RH(D): A POS
Antibody Screen: NEGATIVE
Unit division: 0
Unit division: 0

## 2023-06-25 LAB — CBC
HCT: 24 % — ABNORMAL LOW (ref 39.0–52.0)
Hemoglobin: 8 g/dL — ABNORMAL LOW (ref 13.0–17.0)
MCH: 28 pg (ref 26.0–34.0)
MCHC: 33.3 g/dL (ref 30.0–36.0)
MCV: 83.9 fL (ref 80.0–100.0)
Platelets: 283 10*3/uL (ref 150–400)
RBC: 2.86 MIL/uL — ABNORMAL LOW (ref 4.22–5.81)
RDW: 17.2 % — ABNORMAL HIGH (ref 11.5–15.5)
WBC: 7.2 10*3/uL (ref 4.0–10.5)
nRBC: 0 % (ref 0.0–0.2)

## 2023-06-25 LAB — BASIC METABOLIC PANEL
Anion gap: 11 (ref 5–15)
BUN: 57 mg/dL — ABNORMAL HIGH (ref 8–23)
CO2: 24 mmol/L (ref 22–32)
Calcium: 7.8 mg/dL — ABNORMAL LOW (ref 8.9–10.3)
Chloride: 100 mmol/L (ref 98–111)
Creatinine, Ser: 1.44 mg/dL — ABNORMAL HIGH (ref 0.61–1.24)
GFR, Estimated: 52 mL/min — ABNORMAL LOW (ref >60)
Glucose, Bld: 115 mg/dL — ABNORMAL HIGH (ref 70–99)
Potassium: 3.3 mmol/L — ABNORMAL LOW (ref 3.5–5.1)
Sodium: 135 mmol/L (ref 135–145)

## 2023-06-25 LAB — HEMOGLOBIN AND HEMATOCRIT, BLOOD
HCT: 27.4 % — ABNORMAL LOW (ref 39.0–52.0)
Hemoglobin: 8.9 g/dL — ABNORMAL LOW (ref 13.0–17.0)

## 2023-06-25 LAB — PHOSPHORUS: Phosphorus: 2.7 mg/dL (ref 2.5–4.6)

## 2023-06-25 LAB — GLUCOSE, CAPILLARY
Glucose-Capillary: 65 mg/dL — ABNORMAL LOW (ref 70–99)
Glucose-Capillary: 89 mg/dL (ref 70–99)
Glucose-Capillary: 99 mg/dL (ref 70–99)
Glucose-Capillary: 163 mg/dL — ABNORMAL HIGH (ref 70–99)
Glucose-Capillary: 172 mg/dL — ABNORMAL HIGH (ref 70–99)

## 2023-06-25 LAB — MAGNESIUM: Magnesium: 1.9 mg/dL (ref 1.7–2.4)

## 2023-06-25 MED ORDER — ORAL CARE MOUTH RINSE: 15.0000 mL | OROMUCOSAL | Status: DC | PRN

## 2023-06-25 MED ORDER — POTASSIUM CHLORIDE 20 MEQ PO PACK
20.0000 meq | PACK | Freq: Two times a day (BID) | ORAL | Status: DC
  Administered 2023-06-25 (×2): 20 meq via ORAL
  Filled 2023-06-25 (×3): qty 1

## 2023-06-25 MED ORDER — ORAL CARE MOUTH RINSE
15.0000 mL | OROMUCOSAL | Status: DC
  Administered 2023-06-25 – 2023-06-26 (×6): 15 mL via OROMUCOSAL

## 2023-06-25 MED ORDER — ORAL CARE MOUTH RINSE
15.0000 mL | OROMUCOSAL | Status: DC
  Administered 2023-06-25 – 2023-07-05 (×40): 15 mL via OROMUCOSAL

## 2023-06-25 NOTE — Plan of Care (Signed)
  Problem: Education: Goal: Ability to describe self-care measures that may prevent or decrease complications (Diabetes Survival Skills Education) will improve Outcome: Progressing   Problem: Coping: Goal: Ability to adjust to condition or change in health will improve Outcome: Progressing   Problem: Fluid Volume: Goal: Ability to maintain a balanced intake and output will improve Outcome: Progressing   Problem: Health Behavior/Discharge Planning: Goal: Ability to identify and utilize available resources and services will improve Outcome: Progressing   Problem: Metabolic: Goal: Ability to maintain appropriate glucose levels will improve Outcome: Progressing   Problem: Nutritional: Goal: Maintenance of adequate nutrition will improve Outcome: Progressing   Problem: Skin Integrity: Goal: Risk for impaired skin integrity will decrease Outcome: Progressing   Problem: Tissue Perfusion: Goal: Adequacy of tissue perfusion will improve Outcome: Progressing   Problem: Education: Goal: Ability to demonstrate management of disease process will improve Outcome: Progressing   Problem: Activity: Goal: Capacity to carry out activities will improve Outcome: Progressing   Problem: Cardiac: Goal: Ability to achieve and maintain adequate cardiopulmonary perfusion will improve Outcome: Progressing   Problem: Respiratory: Goal: Ability to maintain adequate ventilation will improve Outcome: Progressing Goal: Ability to maintain a clear airway will improve Outcome: Progressing

## 2023-06-25 NOTE — Progress Notes (Signed)
[facility]  ROUNDING NOTE   Subjective:   Jeremy [name] is a 72 year old male with past medical conditions including hyperlipidemia, diabetes, hypertension, respiratory failure, COPD with 4 L nasal cannula at baseline, depression, diastolic heart failure, multiple sclerosis, liver cirrhosis, anemia, pulmonary hypertension, and chronic kidney disease stage IIIa.  Patient presents to the emergency department complaining of shortness of breath and has been admitted for Acute on chronic respiratory failure with hypoxia [facility]) [J96.21]  Patient is known to our practice and is followed by Dr. Thedore Mins outpatient.  Patient was last seen in office on 07/01/2022 for routine follow-up.    Patient seen sitting up in bed, wife at bedside Alert and oriented Remains on 4 L Generalized edema slowly improving  Creatinine stable Urine output 900 mL recorded on day shift yesterday  IV furosemide drip at 4 mg/h  Objective:  Vital signs in last 24 hours:  Temp:  [97.9 F (36.6 C)-99.6 F (37.6 C)] 98.2 F (36.8 C) (08/31 1238) Pulse Rate:  [73-97] 73 (08/31 1238) Resp:  [16-18] 16 (08/31 1238) BP: (115-135)/(42-59) 115/46 (08/31 1238) SpO2:  [90 %-95 %] 91 % (08/31 1238) Weight:  [105 kg] 105 kg (08/31 0500)  Weight change: 1.9 kg Filed Weights   06/22/23 0443 06/23/23 0706 06/25/23 0500  Weight: 102.1 kg 103.1 kg 105 kg    Intake/Output: I/O last 3 completed shifts: In: 2041.8 [P.O.:1560; I.V.:41.8; Blood:290; IV Piggyback:150] Out: 1500 [Urine:1500]   Intake/Output this shift:  Total I/O In: -  Out: 1850 [Urine:1850]  Physical Exam: General: NAD  Head: Normocephalic, atraumatic. Moist oral mucosal membranes  Eyes: Anicteric  Lungs:  Basilar wheeze, 4 L [place]  Heart: Regular rate and rhythm  Abdomen:  Soft, nontender, nondistended  Extremities: 2+ peripheral edema.  Neurologic: Alert and oriented, moving all four extremities  Skin: No lesions  GU Foley catheter    Basic  Metabolic Panel: Recent Labs  Lab 06/21/23 1850 06/22/23 0026 06/22/23 0619 06/22/23 1649 06/23/23 0531 06/23/23 1624 06/24/23 0432 06/25/23 0539  NA 120*   < > 122* 126* 125* 128* 132* 135  K 4.6   < > 4.6 4.4 4.3 4.1 3.9 3.3*  CL 86*   < > 88* 93* 90* 93* 94* 100  CO2 24   < > 25 24 26 23 25 24   GLUCOSE 109*   < > 162* 197* 148* 192* 104* 115*  BUN 31*   < > 34* 39* 48* 54* 57* 57*  CREATININE 1.27*   < > 1.30* 1.30* 1.44* 1.42* 1.53* 1.44*  CALCIUM 7.9*   < > 7.6* 7.9* 7.9* 8.0* 8.9 7.8*  MG 1.9  --  1.9  --  2.0  --  2.0 1.9  PHOS  --   --  2.9  --  2.8  --  2.9 2.7   < > = values in this interval not displayed.    Liver Function Tests: Recent Labs  Lab 06/21/23 1504 06/24/23 0432 06/25/23 0539  AST 27 21 27   ALT 10 12 12   ALKPHOS 95 61 57  BILITOT 0.9 0.8 0.9  PROT 6.6 5.5* 5.1*  ALBUMIN 2.1* 1.9* 2.1*   No results for input(s): "LIPASE", "AMYLASE" in the last 168 hours. Recent Labs  Lab 06/21/23 2019  AMMONIA 26    CBC: Recent Labs  Lab 06/20/23 1330 06/20/23 1330 06/21/23 1504 06/22/23 0619 06/23/23 0531 06/23/23 1624 06/24/23 0432 06/24/23 1636 06/25/23 0539 06/25/23 1334  WBC 8.5  --  9.1 3.8*  5.8  --  7.1  --  7.2  --   NEUTROABS 7.0  --  7.1  --   --   --   --   --   --   --   HGB 8.7*   < > 8.8* 7.4* 6.3* 7.3* 6.9* 9.2* 8.0* 8.9*  HCT 26.8*  --  26.7* 22.4* 19.0* 22.1* 20.5* 28.6* 24.0* 27.4*  MCV 86.7  --  86.1 85.5 84.4  --  83.3  --  83.9  --   PLT 286  --  326 264 278  --  304  --  283  --    < > = values in this interval not displayed.    Cardiac Enzymes: No results for input(s): "CKTOTAL", "CKMB", "CKMBINDEX", "TROPONINI" in the last 168 hours.  BNP: Invalid input(s): "POCBNP"  CBG: Recent Labs  Lab 06/24/23 1535 06/24/23 2056 06/25/23 0408 06/25/23 0838 06/25/23 1240  GLUCAP 132* 205* 65* 89 99    Microbiology: Results for orders placed or performed during the hospital encounter of 06/21/23  Culture, blood  (Routine x 2)     Status: None (Preliminary result)   Collection Time: 06/21/23  3:02 PM   Specimen: BLOOD  Result Value Ref Range Status   Specimen Description BLOOD LEFT ANTECUBITAL  Final   Special Requests   Final    BOTTLES DRAWN AEROBIC AND ANAEROBIC Blood Culture adequate volume   Culture   Final    NO GROWTH 4 DAYS Performed at [facility], [address], [place], [place] [address]    Report Status PENDING  Incomplete  Culture, blood (Routine x 2)     Status: None (Preliminary result)   Collection Time: 06/21/23  3:13 PM   Specimen: BLOOD  Result Value Ref Range Status   Specimen Description BLOOD RIGHT ANTECUBITAL  Final   Special Requests   Final    BOTTLES DRAWN AEROBIC AND ANAEROBIC Blood Culture adequate volume   Culture   Final    NO GROWTH 4 DAYS Performed at [facility], [address] [place], [place] [address]    Report Status PENDING  Incomplete  Resp panel by RT-PCR (RSV, Flu A&B, Covid) Anterior Nasal Swab     Status: None   Collection Time: 06/21/23  4:35 PM   Specimen: Anterior Nasal Swab  Result Value Ref Range Status   SARS Coronavirus 2 by RT PCR NEGATIVE NEGATIVE Final    Comment: (NOTE) SARS-CoV-2 target nucleic acids are NOT DETECTED.  The SARS-CoV-2 RNA is generally detectable in upper respiratory specimens during the acute phase of infection. The lowest concentration of SARS-CoV-2 viral copies this assay can detect is 138 copies/mL. A negative result does not preclude SARS-Cov-2 infection and should not be used as the sole basis for treatment or other patient management decisions. A negative result may occur with  improper specimen collection/handling, submission of specimen other than nasopharyngeal swab, presence of viral mutation(s) within the areas targeted by this assay, and inadequate number of viral copies(<138 copies/mL). A negative result must be combined with clinical observations, patient history, and  epidemiological information. The expected result is Negative.  Fact Sheet for Patients:  BloggerCourse.com  Fact Sheet for Healthcare Providers:  SeriousBroker.it  This test is no t yet approved or cleared by the [place] FDA and  has been authorized for detection and/or diagnosis of SARS-CoV-2 by FDA under an Emergency Use Authorization (EUA). This EUA will remain  in effect (meaning this test can  be used) for the duration of the COVID-19 declaration under Section 564(b)(1) of the Act, 21 U.S.C.section 360bbb-3(b)(1), unless the authorization is terminated  or revoked sooner.       Influenza A by PCR NEGATIVE NEGATIVE Final   Influenza B by PCR NEGATIVE NEGATIVE Final    Comment: (NOTE) The Xpert Xpress SARS-CoV-2/FLU/RSV plus assay is intended as an aid in the diagnosis of influenza from Nasopharyngeal swab specimens and should not be used as a sole basis for treatment. Nasal washings and aspirates are unacceptable for Xpert Xpress SARS-CoV-2/FLU/RSV testing.  Fact Sheet for Patients: BloggerCourse.com  Fact Sheet for Healthcare Providers: SeriousBroker.it  This test is not yet approved or cleared by the [place] FDA and has been authorized for detection and/or diagnosis of SARS-CoV-2 by FDA under an Emergency Use Authorization (EUA). This EUA will remain in effect (meaning this test can be used) for the duration of the COVID-19 declaration under Section 564(b)(1) of the Act, 21 U.S.C. section 360bbb-3(b)(1), unless the authorization is terminated or revoked.     Resp Syncytial Virus by PCR NEGATIVE NEGATIVE Final    Comment: (NOTE) Fact Sheet for Patients: BloggerCourse.com  Fact Sheet for Healthcare Providers: SeriousBroker.it  This test is not yet approved or cleared by the [place] FDA and has been  authorized for detection and/or diagnosis of SARS-CoV-2 by FDA under an Emergency Use Authorization (EUA). This EUA will remain in effect (meaning this test can be used) for the duration of the COVID-19 declaration under Section 564(b)(1) of the Act, 21 U.S.C. section 360bbb-3(b)(1), unless the authorization is terminated or revoked.  Performed at [facility], [address] [place], [place] [address]   MRSA Next Gen by PCR, Nasal     Status: Abnormal   Collection Time: 06/21/23  8:14 PM   Specimen: Nasal Mucosa; Nasal Swab  Result Value Ref Range Status   MRSA by PCR Next Gen DETECTED (A) NOT DETECTED Final    Comment: RESULT CALLED TO, READ BACK BY AND VERIFIED WITH: KRISTY SNEEAD AT 2206 ON 06/21/23 BY SS (NOTE) The GeneXpert MRSA Assay (FDA approved for NASAL specimens only), is one component of a comprehensive MRSA colonization surveillance program. It is not intended to diagnose MRSA infection nor to guide or monitor treatment for MRSA infections. Test performance is not FDA approved in patients less than 97 years old. Performed at [facility], [address] [place], [place] [address]   Respiratory (~20 pathogens) panel by PCR     Status: None   Collection Time: 06/22/23  3:22 PM   Specimen: Nasopharyngeal Swab; Respiratory  Result Value Ref Range Status   Adenovirus NOT DETECTED NOT DETECTED Final   Coronavirus 229E NOT DETECTED NOT DETECTED Final    Comment: (NOTE) The Coronavirus on the Respiratory Panel, DOES NOT test for the novel  Coronavirus (2019 nCoV)    Coronavirus HKU1 NOT DETECTED NOT DETECTED Final   Coronavirus NL63 NOT DETECTED NOT DETECTED Final   Coronavirus OC43 NOT DETECTED NOT DETECTED Final   Metapneumovirus NOT DETECTED NOT DETECTED Final   Rhinovirus / Enterovirus NOT DETECTED NOT DETECTED Final   Influenza A NOT DETECTED NOT DETECTED Final   Influenza B NOT DETECTED NOT DETECTED Final   Parainfluenza Virus 1 NOT DETECTED  NOT DETECTED Final   Parainfluenza Virus 2 NOT DETECTED NOT DETECTED Final   Parainfluenza Virus 3 NOT DETECTED NOT DETECTED Final   Parainfluenza Virus 4 NOT DETECTED NOT DETECTED Final   Respiratory Syncytial  Virus NOT DETECTED NOT DETECTED Final   Bordetella pertussis NOT DETECTED NOT DETECTED Final   Bordetella Parapertussis NOT DETECTED NOT DETECTED Final   Chlamydophila pneumoniae NOT DETECTED NOT DETECTED Final   Mycoplasma pneumoniae NOT DETECTED NOT DETECTED Final    Comment: Performed at [facility], [address] [place], [place] [address]  Body fluid culture w Gram Stain     Status: None (Preliminary result)   Collection Time: 06/24/23 12:12 PM   Specimen: PATH Cytology Peritoneal fluid  Result Value Ref Range Status   Specimen Description   Final    PERITONEAL Performed at [facility], [address], [place], [place] [address]    Special Requests   Final    NONE Performed at [facility], [address] [place], [place] [address]    Gram Stain NO WBC SEEN NO ORGANISMS SEEN   Final   Culture   Final    NO GROWTH < 24 HOURS Performed at [facility], [address], [place], [place] [address]    Report Status PENDING  Incomplete    Coagulation Studies: Recent Labs    06/24/23 0432  LABPROT 18.5*  INR 1.5*    Urinalysis: No results for input(s): "COLORURINE", "LABSPEC", "PHURINE", "GLUCOSEU", "HGBUR", "BILIRUBINUR", "KETONESUR", "PROTEINUR", "UROBILINOGEN", "NITRITE", "LEUKOCYTESUR" in the last 72 hours.  Invalid input(s): "APPERANCEUR"    Imaging: US THORACENTESIS ASP PLEURAL SPACE W/IMG GUIDE  Result Date: 06/24/2023 INDICATION: Right pleural effusion. Request received for diagnostic and therapeutic thoracentesis. EXAM: ULTRASOUND GUIDED RIGHT THORACENTESIS MEDICATIONS: 8 cc 1% lidocaine COMPLICATIONS: None immediate. PROCEDURE: An ultrasound guided thoracentesis was thoroughly discussed with the patient and questions  answered. The benefits, risks, alternatives and complications were also discussed. The patient understands and wishes to proceed with the procedure. Written consent was obtained. Ultrasound was performed to localize and mark an adequate pocket of fluid in the RIGHT chest. The area was then prepped and draped in the normal sterile fashion. 1% Lidocaine was used for local anesthesia. Under ultrasound guidance a 6 Fr Safe-T-Centesis catheter was introduced. Thoracentesis was performed. The catheter was removed and a dressing applied. FINDINGS: A total of approximately 1.2 L of serosanguineous fluid was removed. Samples were sent to the laboratory as requested by the clinical team. IMPRESSION: Successful ultrasound guided diagnostic RIGHT thoracentesis yielding 1.2 L of pleural fluid. Follow-up chest x-ray revealed no evidence of pneumothorax. Procedure performed by Mina Marble, PA-C Electronically Signed   By: Roanna Banning M.D.   On: 06/24/2023 12:57   DG Chest Port 1 View  Result Date: 06/24/2023 CLINICAL DATA:  Status post thoracentesis for right pleural effusion. EXAM: PORTABLE CHEST 1 VIEW COMPARISON:  06/21/2023 FINDINGS: Stable cardiomediastinal contours. The right pleural effusion has decreased in volume from the previous exam. No pneumothorax identified. Diffuse increase interstitial markings are identified bilaterally, similar to previous exam. Nodular opacity within the right upper lobe is again noted measuring 1.3 cm. This is been evaluated with prior PET-CT showing no significant uptake favoring a benign nodule. IMPRESSION: 1. Interval decrease in volume of right pleural effusion. No pneumothorax identified. 2. Increase interstitial markings favored to represent pulmonary vascular congestion/mild edema. Electronically Signed   By: Signa Kell M.D.   On: 06/24/2023 12:01     Medications:    albumin human 12.5 g (06/25/23 1009)   furosemide (LASIX) 200 mg in dextrose 5 % 100 mL (2 mg/mL)  infusion 4 mg/hr (06/24/23 0756)    sodium chloride  Intravenous Once   vitamin C  500 mg Oral Daily   Chlorhexidine Gluconate Cloth  6 each Topical Daily   folic acid  1 mg Oral Daily   gemfibrozil  600 mg Oral BID AC   guaiFENesin  600 mg Oral BID   insulin aspart  0-5 Units Subcutaneous QHS   insulin aspart  0-9 Units Subcutaneous TID WC   insulin glargine-yfgn  7 Units Subcutaneous QHS   Interferon Beta-1b  0.25 mg Subcutaneous QODAY   iron polysaccharides  150 mg Oral Daily   loratadine  10 mg Oral Daily   nystatin   Topical TID   mouth rinse  15 mL Mouth Rinse 4 times per day   mouth rinse  15 mL Mouth Rinse 4 times per day   pantoprazole  40 mg Oral BID   potassium chloride  20 mEq Oral BID   pregabalin  200 mg Oral BID   sertraline  25 mg Oral Daily   simvastatin  40 mg Oral q1800   sodium chloride  2 g Oral TID WC   tamsulosin  0.4 mg Oral QPC supper   Vitamin D (Ergocalciferol)  50,000 Units Oral Q7 days   acetaminophen, albuterol, guaiFENesin-dextromethorphan, hydrALAZINE, ondansetron (ZOFRAN) IV, mouth rinse, mouth rinse, traMADol  Assessment/ Plan:  Jeremy [name] is a 72 y.o.  male with past medical conditions including hyperlipidemia, diabetes, hypertension, respiratory failure, COPD with 4 L nasal cannula at baseline, depression, diastolic heart failure, multiple sclerosis, liver cirrhosis, anemia, pulmonary hypertension, and chronic kidney disease stage IIIa.  Patient presents to the emergency department complaining of shortness of breath and has been admitted for Acute on chronic respiratory failure with hypoxia (HCC) [J96.21]   Chronic kidney disease stage IIIa, patient currently at baseline renal function. Chronic kidney disease is secondary to diabetes, hypertension, advanced age Creatinine remained stable with adequate response to diuretic therapy.  Continue diuresis at prescribed rate.  Lab Results  Component Value Date   CREATININE 1.44 (H) 06/25/2023    CREATININE 1.53 (H) 06/24/2023   CREATININE 1.42 (H) 06/23/2023    Intake/Output Summary (Last 24 hours) at 06/25/2023 1532 Last data filed at 06/25/2023 1020 Gross per 24 hour  Intake 1029.77 ml  Output 2750 ml  Net -1720.23 ml   2.  Volume overload, appears multifactorial with contributing factors of COPD and CHF exacerbation.  - Patient receiving supportive care with Solu-Medrol and diuresis.  Fluid restriction and symptom management.  3. Anemia of chronic kidney disease Lab Results  Component Value Date   HGB 8.9 (L) 06/25/2023    Hemoglobin improved with 1 unit blood transfusion yesterday.  4.  Diabetes mellitus type II with chronic kidney disease/renal manifestations: insulin dependent. Home regimen includes Lantus. Most recent hemoglobin A1c is 4.4 on 11/24/22.     LOS: 4 Blayke Cordrey 8/31/20243:32 PM

## 2023-06-25 NOTE — Progress Notes (Signed)
[facility] Progress Note  Patient: Jeremy [name]    OZH:[id number]  DOA: 06/21/2023     Date of Service: the patient was seen and examined on 06/25/2023  Chief Complaint  Patient presents with   Shortness of Breath   Brief hospital course: Jeremy [name] is a 72 y.o. male with medical history significant of HTN, HLD, DM, chronic respiratory failure, COPD on 4L O2, dCHF, GIB, depression, MS (wheelchair-bound), pulmonary hypertension, chronic pain, liver cirrhosis, obesity, anemia, who presents with SOB.   Patient was recently hospitalized from 7/12 - 7/26 due to CHF exacerbation and right lobar pneumonia. Patient states that he has worsening shortness of breath in the past several days, which has been progressively worsening.  Patient has cough with clear thick mucus production.  Denies chest pain, fever or chills.  Patient also has worsening bilateral leg edema and 13 pounds of weight gain recently.   Patient is normally using 4L of oxygen, but was found to have severe respiratory distress, using accessory muscle for breathing in ED, oxygen saturation 88% on 5 L oxygen.  Patient was given 1 dose of Lasix to 40 mg, 125 mg of Solu-Medrol and nebulizer treatment in ED. He feels better.  Currently on 8 L of oxygen with 98% of saturation.  Patient had urinary retention with Foley catheter placed recently.  After removal of Foley catheter, patient had bladder scan in PCPs office 2 weeks ago, which showed urinary retention, so Foley catheter was placed again.    Data reviewed independently and ED Course: pt was found to have BMP for 65.9, troponin level 21 --> 20, lactic acid of 9.6, WBC 9.1, INR 1.3, sodium 120, stable renal function, temperature normal, blood pressure 125/49, heart rate 80s.  VBG with pH 7.42, CO2 46, O2 70.8.  Chest x-ray showed moderate right pleural effusion.  Patient is admitted to stepdown as inpatient.     Assessment and Plan:  # Acute on chronic hypoxic respiratory  failure most likely due to CHF exacerbation Patient is on 4 L oxygen at baseline Continue supplemental O2 admission and gradually wean down   # Diastolic CHF exacerbation and anasarca with significant lower extremity edema Patient presented with shortness of breath and LE edema S/p Lasix 40 mg IV twice daily Started Lasix IV infusion Monitor intake output and daily body weight Continue fluid restriction 1200 L/day 8/30 started albumin 12.5 g IV Q6 hourly for 2 days  Anemia in chronic kidney disease (CKD), Iron deficiency and folic acid deficiency 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  As per patient's wife patient cannot have more procedures done due to respiratory failure and he may not wake up.  Patient and his wife would prefer conservative management.  8/30 Hb 6.9, 1 unit PRBC transfused. 8/31 Hb 8.0--8.9  -Follow-up with CBC   # Stage 3a chronic kidney disease  Recent baseline 0.90 1.2-1.4.   creatinine 1.4 stable on current diuresis Monitor renal function and urine output Continue IV albumin as above Nephrology consulted for further recommendation   # COPD exacerbation, most likely due to volume overload S/p Solu-Medrol 125 mg given in the ED,  s/p Solu-Medrol 40 mg IV BID, DC'd on 8/29  S/p  doxycycline 100 mg p.o. twice daily d/c'd on 8/30 Started Mucinex  600 mg p.o. twice daily Continue Robitussin DM as needed   # Pleural effusion Moderate right pleural effusion most likely due to CHF, liver cirrhosis with hypoalbuminemia Continue diuresis as above 8/30 s/p thoracentesis 1.2 L fluid was tapped by IR, postprocedure x-ray did not show any complications. Fluid studies, as per lights criteria, LDH elevated, consistent with exudative but  clinically no signs of infection.  Monitor off antibiotics for now.   IDDM T2 Held home regimen for now Continue Semglee 7 units nightly NovoLog sliding scale Monitor CBG, continue diabetic diet Risk of hyperglycemia due to steroids   Essential hypertension -IV hydralazine as needed -Patient is on IV Lasix   Hyperlipidemia -Zocor and lopid   Multiple sclerosis (HCC) -pt is on Betaseron injection -Fall precaution   Myocardial injury: troponin level 21 --> -20.  No chest pain -Continue Zocor   Folic acid deficiency, folate level 5.2 Started folate supplement.  Hypotonic hyponatremia: Serum osmolality 265 low Na 120.  Mental status normal. Sodium chloride 2 g p.o. 3 times daily for 5 days Continue fluid restriction 1200 ml/d Na 120---132  Check BMP daily now     # Cirrhosis of liver without ascites (HCC): Mental status normal.  INR 1.3 -ammonia level 26 wnl -INR 1.3, PT 16.7, PTT 42, slightly elevated    # Chronic urinary retention: Has a Foley catheter in place -Flomax   # Depression -Continue home medication, Zoloft  Vitamin D deficiency: started vitamin D 50,000 units p.o. weekly, follow with PCP to repeat vitamin D level after 3 to 6 months.  # Obesity Body mass index is 33.21 kg/m.  Interventions:  Diet: Heart healthy/carb modified, fluid striction 1200 mL/day DVT Prophylaxis: Subcutaneous Lovenox   Advance goals of care discussion: Full code  Family Communication: family was present at bedside, at the time of interview.  The pt provided permission to discuss medical plan with the family. Opportunity was given to ask question and all questions were answered satisfactorily.   Disposition:  Pt is from home, admitted with hyponatremia, anasarca, diastolic CHF, acute on chronic hypoxic respiratory failure, started IV Lasix infusion, which precludes a safe discharge. Discharge to SNF, when stable.  Subjective: No significant events overnight, patient's  lower extremity edema gradually getting better, breathing is same, no improvement.  Patient denied any complaints, laying comfortably. As per patient's wife he is still having black stools, does not want aggressive measures, no CT angio bleeding due to CKD and overall he is poor candidate for any procedure.  Goals of care discussed, we will continue conservative management, patient still remains full code.   Physical Exam: General: Mild respiratory distress, Laying comfortably. Appear in mild distress, affect appropriate depressed Eyes: PERRLA ENT: Oral Mucosa Clear, moist  Neck: no JVD,  Cardiovascular: S1 and S2 Present, no Murmur,  Respiratory: Equal air entry bilaterally, bilateral crackles and decreased breath sounds in the bibasilar area more decreased on the right lower area due to pleural effusion Abdomen: Bowel Sound present, Soft and no tenderness,  Skin: no rashes Extremities: 3-4+ edema, anasarca, no calf tenderness, edema gradually improving Neurologic: without any new focal findings Gait not checked due to patient safety concerns  Vitals:   06/25/23 0500 06/25/23 0805 06/25/23 0835 06/25/23 1238  BP:   (!) 125/49 (!) 115/46  Pulse:   87 73  Resp:   17 16  Temp:   98.3 F (36.8 C) 98.2 F (36.8 C)  TempSrc:   Oral   SpO2:  90%  91%  Weight: 105 kg     Height:        Intake/Output Summary (Last 24 hours) at 06/25/2023 1512 Last data filed at 06/25/2023 1020 Gross per 24 hour  Intake 1319.77 ml  Output 2750 ml  Net -1430.23 ml   Filed Weights   06/22/23 0443 06/23/23 0706 06/25/23 0500  Weight: 102.1 kg 103.1 kg 105 kg    Data Reviewed: I have personally reviewed and interpreted daily labs, tele strips, imagings as discussed above. I reviewed all nursing notes, pharmacy notes, vitals, pertinent old records I have discussed plan of care as described above with RN and patient/family.  CBC: Recent Labs  Lab 06/20/23 1330 06/20/23 1330 06/21/23 1504  06/22/23 0619 06/23/23 0531 06/23/23 1624 06/24/23 0432 06/24/23 1636 06/25/23 0539 06/25/23 1334  WBC 8.5  --  9.1 3.8* 5.8  --  7.1  --  7.2  --   NEUTROABS 7.0  --  7.1  --   --   --   --   --   --   --   HGB 8.7*   < > 8.8* 7.4* 6.3* 7.3* 6.9* 9.2* 8.0* 8.9*  HCT 26.8*  --  26.7* 22.4* 19.0* 22.1* 20.5* 28.6* 24.0* 27.4*  MCV 86.7  --  86.1 85.5 84.4  --  83.3  --  83.9  --   PLT 286  --  326 264 278  --  304  --  283  --    < > = values in this interval not displayed.   Basic Metabolic Panel: Recent Labs  Lab 06/21/23 1850 06/22/23 0026 06/22/23 6213 06/22/23 1649 06/23/23 0531 06/23/23 1624 06/24/23 0432 06/25/23 0539  NA 120*   < > 122* 126* 125* 128* 132* 135  K 4.6   < > 4.6 4.4 4.3 4.1 3.9 3.3*  CL 86*   < > 88* 93* 90* 93* 94* 100  CO2 24   < > 25 24 26 23 25 24   GLUCOSE 109*   < > 162* 197* 148* 192* 104* 115*  BUN 31*   < > 34* 39* 48* 54* 57* 57*  CREATININE 1.27*   < > 1.30* 1.30* 1.44* 1.42* 1.53* 1.44*  CALCIUM 7.9*   < > 7.6* 7.9* 7.9* 8.0* 8.9 7.8*  MG 1.9  --  1.9  --  2.0  --  2.0 1.9  PHOS  --   --  2.9  --  2.8  --  2.9 2.7   < > = values in this interval not displayed.    Studies: No results found.  Scheduled Meds:  sodium chloride   Intravenous Once   vitamin C  500 mg Oral Daily   Chlorhexidine Gluconate Cloth  6 each Topical Daily   folic acid  1 mg Oral Daily   gemfibrozil  600 mg Oral BID AC   guaiFENesin  600 mg Oral BID   insulin aspart  0-5 Units Subcutaneous QHS   insulin aspart  0-9 Units Subcutaneous TID WC   insulin glargine-yfgn  7 Units Subcutaneous QHS   Interferon Beta-1b  0.25 mg Subcutaneous QODAY   iron polysaccharides  150 mg Oral Daily   loratadine  10 mg Oral Daily   nystatin   Topical TID   mouth rinse  15 mL Mouth Rinse 4 times per day   mouth rinse  15 mL Mouth Rinse 4 times per day   pantoprazole  40 mg Oral BID   potassium chloride  20  mEq Oral BID   pregabalin  200 mg Oral BID   sertraline  25 mg Oral  Daily   simvastatin  40 mg Oral q1800   sodium chloride  2 g Oral TID WC   tamsulosin  0.4 mg Oral QPC supper   Vitamin D (Ergocalciferol)  50,000 Units Oral Q7 days   Continuous Infusions:  albumin human 12.5 g (06/25/23 1009)   furosemide (LASIX) 200 mg in dextrose 5 % 100 mL (2 mg/mL) infusion 4 mg/hr (06/24/23 0756)   PRN Meds: acetaminophen, albuterol, guaiFENesin-dextromethorphan, hydrALAZINE, ondansetron (ZOFRAN) IV, mouth rinse, mouth rinse, traMADol  Time spent: 55 minutes  Author: Gillis Santa. MD [facility] 06/25/2023 3:12 PM  To reach On-call, see care teams to locate the attending and reach out to them via [email or web address]. If 7PM-7AM, please contact night-coverage If you still have difficulty reaching the attending provider, please page the [facility] (Director on Call) for [facility] on amion for assistance.

## 2023-06-25 NOTE — Plan of Care (Signed)
  Problem: Education: Goal: Ability to describe self-care measures that may prevent or decrease complications (Diabetes Survival Skills Education) will improve Outcome: Progressing Goal: Individualized Educational Video(s) Outcome: Progressing   Problem: Coping: Goal: Ability to adjust to condition or change in health will improve Outcome: Progressing   Problem: Fluid Volume: Goal: Ability to maintain a balanced intake and output will improve Outcome: Progressing   Problem: Health Behavior/Discharge Planning: Goal: Ability to identify and utilize available resources and services will improve Outcome: Progressing Goal: Ability to manage health-related needs will improve Outcome: Progressing   Problem: Metabolic: Goal: Ability to maintain appropriate glucose levels will improve Outcome: Progressing   Problem: Nutritional: Goal: Maintenance of adequate nutrition will improve Outcome: Progressing Goal: Progress toward achieving an optimal weight will improve Outcome: Progressing   Problem: Skin Integrity: Goal: Risk for impaired skin integrity will decrease Outcome: Progressing   Problem: Tissue Perfusion: Goal: Adequacy of tissue perfusion will improve Outcome: Progressing   Problem: Education: Goal: Ability to demonstrate management of disease process will improve Outcome: Progressing Goal: Ability to verbalize understanding of medication therapies will improve Outcome: Progressing Goal: Individualized Educational Video(s) Outcome: Progressing   Problem: Activity: Goal: Capacity to carry out activities will improve Outcome: Progressing   Problem: Cardiac: Goal: Ability to achieve and maintain adequate cardiopulmonary perfusion will improve Outcome: Progressing   Problem: Education: Goal: Knowledge of disease or condition will improve Outcome: Progressing Goal: Knowledge of the prescribed therapeutic regimen will improve Outcome: Progressing Goal:  Individualized Educational Video(s) Outcome: Progressing   Problem: Activity: Goal: Ability to tolerate increased activity will improve Outcome: Progressing Goal: Will verbalize the importance of balancing activity with adequate rest periods Outcome: Progressing   Problem: Respiratory: Goal: Ability to maintain a clear airway will improve Outcome: Progressing Goal: Levels of oxygenation will improve Outcome: Progressing Goal: Ability to maintain adequate ventilation will improve Outcome: Progressing   Problem: Activity: Goal: Ability to tolerate increased activity will improve Outcome: Progressing   Problem: Clinical Measurements: Goal: Ability to maintain a body temperature in the normal range will improve Outcome: Progressing   Problem: Respiratory: Goal: Ability to maintain adequate ventilation will improve Outcome: Progressing Goal: Ability to maintain a clear airway will improve Outcome: Progressing   Problem: Education: Goal: Knowledge of General Education information will improve Description: Including pain rating scale, medication(s)/side effects and non-pharmacologic comfort measures Outcome: Progressing   Problem: Health Behavior/Discharge Planning: Goal: Ability to manage health-related needs will improve Outcome: Progressing   Problem: Clinical Measurements: Goal: Ability to maintain clinical measurements within normal limits will improve Outcome: Progressing Goal: Will remain free from infection Outcome: Progressing Goal: Diagnostic test results will improve Outcome: Progressing Goal: Respiratory complications will improve Outcome: Progressing Goal: Cardiovascular complication will be avoided Outcome: Progressing   Problem: Activity: Goal: Risk for activity intolerance will decrease Outcome: Progressing   Problem: Nutrition: Goal: Adequate nutrition will be maintained Outcome: Progressing   Problem: Coping: Goal: Level of anxiety will  decrease Outcome: Progressing   Problem: Elimination: Goal: Will not experience complications related to bowel motility Outcome: Progressing Goal: Will not experience complications related to urinary retention Outcome: Progressing   Problem: Pain Managment: Goal: General experience of comfort will improve Outcome: Progressing   Problem: Safety: Goal: Ability to remain free from injury will improve Outcome: Progressing   Problem: Skin Integrity: Goal: Risk for impaired skin integrity will decrease Outcome: Progressing

## 2023-06-26 ENCOUNTER — Inpatient Hospital Stay: Payer: Medicare Other

## 2023-06-26 DIAGNOSIS — J9621 Acute and chronic respiratory failure with hypoxia: Secondary | ICD-10-CM | POA: Diagnosis not present

## 2023-06-26 LAB — GLUCOSE, CAPILLARY
Glucose-Capillary: 93 mg/dL (ref 70–99)
Glucose-Capillary: 135 mg/dL — ABNORMAL HIGH (ref 70–99)
Glucose-Capillary: 203 mg/dL — ABNORMAL HIGH (ref 70–99)
Glucose-Capillary: 173 mg/dL — ABNORMAL HIGH (ref 70–99)

## 2023-06-26 LAB — HEMOGLOBIN AND HEMATOCRIT, BLOOD
HCT: 32.5 % — ABNORMAL LOW (ref 39.0–52.0)
Hemoglobin: 10.4 g/dL — ABNORMAL LOW (ref 13.0–17.0)

## 2023-06-26 LAB — CBC
HCT: 24.5 % — ABNORMAL LOW (ref 39.0–52.0)
Hemoglobin: 8.2 g/dL — ABNORMAL LOW (ref 13.0–17.0)
MCH: 28.2 pg (ref 26.0–34.0)
MCHC: 33.5 g/dL (ref 30.0–36.0)
MCV: 84.2 fL (ref 80.0–100.0)
Platelets: 266 10*3/uL (ref 150–400)
RBC: 2.91 MIL/uL — ABNORMAL LOW (ref 4.22–5.81)
RDW: 17.2 % — ABNORMAL HIGH (ref 11.5–15.5)
WBC: 6.3 10*3/uL (ref 4.0–10.5)
nRBC: 0 % (ref 0.0–0.2)

## 2023-06-26 LAB — BASIC METABOLIC PANEL
Anion gap: 8 (ref 5–15)
BUN: 52 mg/dL — ABNORMAL HIGH (ref 8–23)
CO2: 26 mmol/L (ref 22–32)
Calcium: 7.8 mg/dL — ABNORMAL LOW (ref 8.9–10.3)
Chloride: 102 mmol/L (ref 98–111)
Creatinine, Ser: 1.26 mg/dL — ABNORMAL HIGH (ref 0.61–1.24)
GFR, Estimated: >60 mL/min (ref >60)
Glucose, Bld: 83 mg/dL (ref 70–99)
Potassium: 3.1 mmol/L — ABNORMAL LOW (ref 3.5–5.1)
Sodium: 136 mmol/L (ref 135–145)

## 2023-06-26 LAB — HEPATIC FUNCTION PANEL
ALT: 12 U/L (ref 0–44)
AST: 18 U/L (ref 15–41)
Albumin: 2.3 g/dL — ABNORMAL LOW (ref 3.5–5.0)
Alkaline Phosphatase: 58 U/L (ref 38–126)
Bilirubin, Direct: 0.4 mg/dL — ABNORMAL HIGH (ref 0.0–0.2)
Indirect Bilirubin: 0.6 mg/dL (ref 0.3–0.9)
Total Bilirubin: 1 mg/dL (ref 0.3–1.2)
Total Protein: 5.1 g/dL — ABNORMAL LOW (ref 6.5–8.1)

## 2023-06-26 LAB — CULTURE, BLOOD (ROUTINE X 2)
Culture: NO GROWTH
Culture: NO GROWTH
Special Requests: ADEQUATE
Special Requests: ADEQUATE

## 2023-06-26 LAB — PHOSPHORUS: Phosphorus: 2.9 mg/dL (ref 2.5–4.6)

## 2023-06-26 LAB — MRSA NEXT GEN BY PCR, NASAL: MRSA by PCR Next Gen: DETECTED — AB

## 2023-06-26 LAB — MAGNESIUM: Magnesium: 1.6 mg/dL — ABNORMAL LOW (ref 1.7–2.4)

## 2023-06-26 MED ORDER — SODIUM CHLORIDE 0.9 % IV SOLN
100.0000 mg | Freq: Two times a day (BID) | INTRAVENOUS | Status: DC
  Administered 2023-06-26 – 2023-07-01 (×10): 100 mg via INTRAVENOUS
  Filled 2023-06-26 (×12): qty 100

## 2023-06-26 MED ORDER — SODIUM CHLORIDE 0.9 % IV SOLN: INTRAVENOUS | Status: DC | PRN

## 2023-06-26 MED ORDER — POTASSIUM CHLORIDE 10 MEQ/100ML IV SOLN
10.0000 meq | INTRAVENOUS | Status: AC
  Administered 2023-06-26 (×4): 10 meq via INTRAVENOUS
  Filled 2023-06-26 (×4): qty 100

## 2023-06-26 MED ORDER — MUPIROCIN 2 % EX OINT
1.0000 | TOPICAL_OINTMENT | Freq: Two times a day (BID) | CUTANEOUS | Status: AC
  Administered 2023-06-26 – 2023-07-01 (×10): 1 via NASAL
  Filled 2023-06-26: qty 22

## 2023-06-26 MED ORDER — MAGNESIUM SULFATE 2 GM/50ML IV SOLN
2.0000 g | Freq: Once | INTRAVENOUS | Status: AC
  Administered 2023-06-26: 2 g via INTRAVENOUS
  Filled 2023-06-26: qty 50

## 2023-06-26 MED ORDER — SODIUM CHLORIDE 0.9 % IV SOLN
3.0000 g | Freq: Four times a day (QID) | INTRAVENOUS | Status: DC
  Administered 2023-06-26 – 2023-07-03 (×27): 3 g via INTRAVENOUS
  Filled 2023-06-26 (×30): qty 8

## 2023-06-26 MED ORDER — POTASSIUM CHLORIDE 20 MEQ PO PACK
20.0000 meq | PACK | Freq: Two times a day (BID) | ORAL | Status: DC
  Administered 2023-06-26 (×2): 20 meq via ORAL
  Filled 2023-06-26: qty 1

## 2023-06-26 NOTE — Progress Notes (Signed)
[facility] Progress Note  Patient: Jeremy [name]    OZH:[id number]  DOA: 06/21/2023     Date of Service: the patient was seen and examined on 06/26/2023  Chief Complaint  Patient presents with   Shortness of Breath   Brief hospital course: Jeremy [name] is a 72 y.o. male with medical history significant of HTN, HLD, DM, chronic respiratory failure, COPD on 4L O2, dCHF, GIB, depression, MS (wheelchair-bound), pulmonary hypertension, chronic pain, liver cirrhosis, obesity, anemia, who presents with SOB.   Patient was recently hospitalized from 7/12 - 7/26 due to CHF exacerbation and right lobar pneumonia. Patient states that he has worsening shortness of breath in the past several days, which has been progressively worsening.  Patient has cough with clear thick mucus production.  Denies chest pain, fever or chills.  Patient also has worsening bilateral leg edema and 13 pounds of weight gain recently.   Patient is normally using 4L of oxygen, but was found to have severe respiratory distress, using accessory muscle for breathing in ED, oxygen saturation 88% on 5 L oxygen.  Patient was given 1 dose of Lasix to 40 mg, 125 mg of Solu-Medrol and nebulizer treatment in ED. He feels better.  Currently on 8 L of oxygen with 98% of saturation.  Patient had urinary retention with Foley catheter placed recently.  After removal of Foley catheter, patient had bladder scan in PCPs office 2 weeks ago, which showed urinary retention, so Foley catheter was placed again.    Data reviewed independently and ED Course: pt was found to have BMP for 65.9, troponin level 21 --> 20, lactic acid of 9.6, WBC 9.1, INR 1.3, sodium 120, stable renal function, temperature normal, blood pressure 125/49, heart rate 80s.  VBG with pH 7.42, CO2 46, O2 70.8.  Chest x-ray showed moderate right pleural effusion.  Patient is admitted to stepdown as inpatient.     Assessment and Plan:  # Acute on chronic hypoxic respiratory  failure most likely due to CHF exacerbation Patient is on 4 L oxygen at baseline Continue supplemental O2 admission and gradually wean down 9/1 CT chest: Bilateral lung collapse, multifocal pneumonia, bilateral effusion R >L 9/1 started Unasyn, pharmacy consulted and doxycycline 100 mg IV twice daily for aspiration PNA and to cover MRSA as well   # Diastolic CHF exacerbation and anasarca with significant lower extremity edema Patient presented with shortness of breath and LE edema S/p Lasix 40 mg IV twice daily Started Lasix IV infusion Monitor intake output and daily body weight Continue fluid restriction 1200 L/day 8/30--8/31 s/p Albumin 12.5 g IV Q6 hourly for 2 days  Anemia in chronic kidney disease (CKD), Iron deficiency and folic acid deficiency 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  As per patient's wife patient cannot have more procedures done due to respiratory failure and he may not wake up.  Patient and his wife would prefer conservative management.  8/30 Hb 6.9, 1 unit PRBC transfused. 8/31 Hb 8.0--8.9  -Follow-up with CBC   # Stage 3a chronic kidney disease  Recent baseline 0.90 1.2-1.4.   creatinine 1.26 stable on current diuresis Monitor renal function and urine output S/p IV albumin as above Nephrology consulted for further recommendation   # COPD exacerbation, most likely due to volume  overload S/p Solu-Medrol 125 mg given in the ED,  s/p Solu-Medrol 40 mg IV BID, DC'd on 8/29  S/p  doxycycline 100 mg p.o. twice daily d/c'd on 8/30 Started Mucinex 600 mg p.o. twice daily Continue Robitussin DM as needed   # Pleural effusion Moderate right pleural effusion most likely due to CHF, liver cirrhosis with hypoalbuminemia Continue  diuresis as above 8/30 s/p thoracentesis 1.2 L fluid was tapped by IR, postprocedure x-ray did not show any complications. Fluid studies, as per lights criteria, LDH elevated, consistent with exudative but clinically no signs of infection.  Monitor off antibiotics for now. 9/1 CT chest shows bilateral effusion, right is moderate.  It seems recurrent pleural effusion.  We will repeat x-ray in 1 to 2 days, may need repeat thoracentesis.   IDDM T2 Held home regimen for now Continue Semglee 7 units nightly NovoLog sliding scale Monitor CBG, continue diabetic diet Risk of hyperglycemia due to steroids   Essential hypertension -IV hydralazine as needed -Patient is on IV Lasix   Hyperlipidemia -Zocor and lopid   Multiple sclerosis (HCC) -pt is on Betaseron injection -Fall precaution   Myocardial injury: troponin level 21 --> -20.  No chest pain -Continue Zocor   Folic acid deficiency, folate level 5.2 Started folate supplement.  Hypokalemia secondary to diuresis, potassium repleted. Monitor electrolytes daily  Hypotonic hyponatremia: Resolved Serum osmolality 265 low Na 120.  Mental status normal. Sodium chloride 2 g p.o. 3 times daily for 5 days Continue fluid restriction 1200 ml/d Na 120---132 --136 Check BMP daily now     # Cirrhosis of liver without ascites (HCC): Mental status normal.  INR 1.3 -ammonia level 26 wnl -INR 1.3, PT 16.7, PTT 42, slightly elevated    # Chronic urinary retention: Has a Foley catheter in place since last admission -Flomax Patient has an appointment with urology on 06/29/2023 for voiding trial.  Since patient is here in the hospital, we will remove Foley for voiding trial before discharge planning.    # Depression -Continue home medication, Zoloft  Vitamin D deficiency: started vitamin D 50,000 units p.o. weekly, follow with PCP to repeat vitamin D level after 3 to 6 months.  # Obesity Body mass index is 33.28 kg/m.   Interventions:  Diet: Heart healthy/carb modified, fluid striction 1200 mL/day DVT Prophylaxis: Subcutaneous Lovenox   Advance goals of care discussion: Full code  Family Communication: family was present at bedside, at the time of interview.  The pt provided permission to discuss medical plan with the family. Opportunity was given to ask question and all questions were answered satisfactorily.   Disposition:  Pt is from home, admitted with hyponatremia, anasarca, diastolic CHF, acute on chronic hypoxic respiratory failure, started IV Lasix infusion, which precludes a safe discharge. Discharge to SNF, when stable.  Subjective: No significant events overnight, yesterday evening the patient's respiratory status got worse and patient was on 10 L oxygen via nasal cannula, due to chest congestion and phlegm, which was suctioned and nebulizer treatment was given.  Breathing improved.  Currently patient is on 6 L oxygen via nasal cannula.  Patient was resting comfortably, denied any worsening of shortness of breath, no chest pain or palpitation, no any other active issues. CT scan was done, discussed with patient's wife at bedside, started antibiotics as above.  Patient may need repeat thoracentesis in 1 to 2 days.  Will repeat x-ray as well in 1 to 2 days.   Physical Exam: General: Mild respiratory distress, Laying comfortably.  Appear in mild distress, affect appropriate depressed Eyes: PERRLA ENT: Oral Mucosa Clear, moist  Neck: no JVD,  Cardiovascular: S1 and S2 Present, no Murmur,  Respiratory: Equal air entry bilaterally, bilateral crackles and decreased breath sounds in the bibasilar area more decreased on the right lower area due to pleural effusion Abdomen: Bowel Sound present, Soft and no tenderness,  Skin: no rashes Extremities: 2-3+ edema mostly involving dorsum of feet and ankle,  no calf tenderness, edema gradually improving Neurologic: without any new focal findings Gait not  checked due to patient safety concerns  Vitals:   06/26/23 0729 06/26/23 0756 06/26/23 1144 06/26/23 1152  BP: (!) 116/54  (!) 144/64   Pulse: 69  65   Resp: 16  14   Temp: 98.1 F (36.7 C)  98.3 F (36.8 C)   TempSrc:      SpO2: 95% 95% (!) 88% 92%  Weight:      Height:        Intake/Output Summary (Last 24 hours) at 06/26/2023 1348 Last data filed at 06/26/2023 1610 Gross per 24 hour  Intake 150 ml  Output 2600 ml  Net -2450 ml   Filed Weights   06/23/23 0706 06/25/23 0500 06/26/23 0500  Weight: 103.1 kg 105 kg 105.2 kg    Data Reviewed: I have personally reviewed and interpreted daily labs, tele strips, imagings as discussed above. I reviewed all nursing notes, pharmacy notes, vitals, pertinent old records I have discussed plan of care as described above with RN and patient/family.  CBC: Recent Labs  Lab 06/20/23 1330 06/20/23 1330 06/21/23 1504 06/22/23 0619 06/23/23 0531 06/23/23 1624 06/24/23 0432 06/24/23 1636 06/25/23 0539 06/25/23 1334 06/26/23 0516  WBC 8.5   < > 9.1 3.8* 5.8  --  7.1  --  7.2  --  6.3  NEUTROABS 7.0  --  7.1  --   --   --   --   --   --   --   --   HGB 8.7*   < > 8.8* 7.4* 6.3*   < > 6.9* 9.2* 8.0* 8.9* 8.2*  HCT 26.8*  --  26.7* 22.4* 19.0*   < > 20.5* 28.6* 24.0* 27.4* 24.5*  MCV 86.7  --  86.1 85.5 84.4  --  83.3  --  83.9  --  84.2  PLT 286   < > 326 264 278  --  304  --  283  --  266   < > = values in this interval not displayed.   Basic Metabolic Panel: Recent Labs  Lab 06/22/23 0619 06/22/23 1649 06/23/23 0531 06/23/23 1624 06/24/23 0432 06/25/23 0539 06/26/23 0516  NA 122*   < > 125* 128* 132* 135 136  K 4.6   < > 4.3 4.1 3.9 3.3* 3.1*  CL 88*   < > 90* 93* 94* 100 102  CO2 25   < > 26 23 25 24 26   GLUCOSE 162*   < > 148* 192* 104* 115* 83  BUN 34*   < > 48* 54* 57* 57* 52*  CREATININE 1.30*   < > 1.44* 1.42* 1.53* 1.44* 1.26*  CALCIUM 7.6*   < > 7.9* 8.0* 8.9 7.8* 7.8*  MG 1.9  --  2.0  --  2.0 1.9 1.6*  PHOS  2.9  --  2.8  --  2.9 2.7 2.9   < > = values in this interval not displayed.    Studies: CT CHEST WO CONTRAST  Result Date: 06/26/2023 CLINICAL DATA:  Pneumonia. EXAM: CT CHEST WITHOUT CONTRAST TECHNIQUE: Multidetector CT imaging of the chest was performed following the standard protocol without IV contrast. RADIATION DOSE REDUCTION: This exam was performed according to the departmental dose-optimization program which includes automated exposure control, adjustment of the mA and/or kV according to patient size and/or use of iterative reconstruction technique. COMPARISON:  08/18/2022 FINDINGS: Cardiovascular: Heart is mildly enlarged. No substantial pericardial effusion. Coronary artery calcification is evident. Mild atherosclerotic calcification is noted in the wall of the thoracic aorta. Enlargement of the pulmonary outflow tract/main pulmonary arteries suggests pulmonary arterial hypertension. Mediastinum/Nodes: Scattered upper normal mediastinal lymph nodes evident. No evidence for gross hilar lymphadenopathy although assessment is limited by the lack of intravenous contrast on the current study. Mild circumferential wall thickening noted distal esophagus. There is no axillary lymphadenopathy. Lungs/Pleura: Peripheral right upper lobe pulmonary nodule measured previously at 1.4 x 1.3 cm is now 1.5 x 1.2 cm on image 62/4. Bilateral lower lobe collapse/consolidation is new in the interval with patchy areas of ground-glass opacity in the left upper lobe. New moderate right and tiny left pleural effusions. Sub pulmonic component to the left pleural effusion. Upper Abdomen: Nodular liver contour is compatible with cirrhosis. Musculoskeletal: No worrisome lytic or sclerotic osseous abnormality. IMPRESSION: 1. Interval development of bilateral lower lobe collapse/consolidation with patchy areas of ground-glass opacity in the left upper lobe. Imaging features compatible with multifocal pneumonia. 2. New moderate  right and tiny left pleural effusions. 3. Similar peripheral right upper lobe pulmonary nodule. This lesion showed no appreciable hypermetabolism on PET-CT of 11/30/2021. 4. Cirrhosis. 5.  Aortic Atherosclerosis (ICD10-I70.0). Electronically Signed   By: Kennith Center M.D.   On: 06/26/2023 10:21    Scheduled Meds:  sodium chloride   Intravenous Once   vitamin C  500 mg Oral Daily   Chlorhexidine Gluconate Cloth  6 each Topical Daily   folic acid  1 mg Oral Daily   gemfibrozil  600 mg Oral BID AC   guaiFENesin  600 mg Oral BID   insulin aspart  0-5 Units Subcutaneous QHS   insulin aspart  0-9 Units Subcutaneous TID WC   insulin glargine-yfgn  7 Units Subcutaneous QHS   Interferon Beta-1b  0.25 mg Subcutaneous QODAY   iron polysaccharides  150 mg Oral Daily   loratadine  10 mg Oral Daily   nystatin   Topical TID   mouth rinse  15 mL Mouth Rinse 4 times per day   mouth rinse  15 mL Mouth Rinse 4 times per day   pantoprazole  40 mg Oral BID   potassium chloride  20 mEq Oral BID   pregabalin  200 mg Oral BID   sertraline  25 mg Oral Daily   simvastatin  40 mg Oral q1800   sodium chloride  2 g Oral TID WC   tamsulosin  0.4 mg Oral QPC supper   Vitamin D (Ergocalciferol)  50,000 Units Oral Q7 days   Continuous Infusions:  furosemide (LASIX) 200 mg in dextrose 5 % 100 mL (2 mg/mL) infusion 4 mg/hr (06/26/23 1136)   potassium chloride 10 mEq (06/26/23 1250)   PRN Meds: acetaminophen, albuterol, guaiFENesin-dextromethorphan, hydrALAZINE, ondansetron (ZOFRAN) IV, mouth rinse, mouth rinse, traMADol  Time spent: 55 minutes  Author: Gillis Santa. MD [facility] 06/26/2023 1:48 PM  To reach On-call, see care teams to locate the attending and reach out to them via [email or web address]. If 7PM-7AM, please contact night-coverage If you still have difficulty reaching  the attending provider, please page the [facility] (Director on Call) for [facility] on amion for assistance.

## 2023-06-26 NOTE — Consult Note (Signed)
Pharmacy Antibiotic Note  Jeremy [name] is a 72 y.o. male admitted on 06/21/2023 with pneumonia. PMH significant for HTN, HLD, DM, chronic respiratory failure / COPD on 4L O2, diastolic CHF, GIB, depression, MS (wheel-chair bound), pulmonary HTN, chronic pain, liver cirrhosis, obesity, anemia. CT chest revealed features compatible with multifocal pneumonia. Pharmacy has been consulted for Unasyn dosing.  Plan: Day 1 of antibiotics Start Unasyn 3 g IV Q6H Patient is also on doxycycline 100 mg IV Q12H Continue to monitor renal function and follow culture results   Height: 5\' 10"  (177.8 cm) Weight: 105.2 kg (231 lb 14.8 oz) IBW/kg (Calculated) : 73  Temp (24hrs), Avg:98.6 F (37 C), Min:98.1 F (36.7 C), Max:99.2 F (37.3 C)  Recent Labs  Lab 06/21/23 1504 06/21/23 1850 06/22/23 0619 06/22/23 1649 06/23/23 0531 06/23/23 1624 06/24/23 0432 06/25/23 0539 06/26/23 0516  WBC 9.1  --  3.8*  --  5.8  --  7.1 7.2 6.3  CREATININE 1.25*   < > 1.30*   < > 1.44* 1.42* 1.53* 1.44* 1.26*  LATICACIDVEN 1.6  --   --   --   --   --   --   --   --    < > = values in this interval not displayed.    Estimated Creatinine Clearance: 65.3 mL/min (A) (by C-G formula based on SCr of 1.26 mg/dL (H)).    Allergies  Allergen Reactions   Betadine [Povidone Iodine]     Antimicrobials this admission: 8/27 Vancomycin x1 8/27 Azithromycin x1 8/27 Cefepime x1 8/27 Doxycycline >> 8/30 9/1 Unasyn >>  9/1 Doxycycline >>  Dose adjustments this admission: N/A  Microbiology results: 8/27 BCx: NG final 8/27 MRSA PCR: positive 8/29 Sputum: ordered  8/30 Peritoneal Cx: NG2D  Thank you for allowing pharmacy to be a part of this patient's care.  Eber Jones Janeka Libman 06/26/2023 2:15 PM

## 2023-06-26 NOTE — Progress Notes (Signed)
[facility]  ROUNDING NOTE   Subjective:   Jeremy [name] is a 72 year old male with past medical conditions including hyperlipidemia, diabetes, hypertension, respiratory failure, COPD with 4 L nasal cannula at baseline, depression, diastolic heart failure, multiple sclerosis, liver cirrhosis, anemia, pulmonary hypertension, and chronic kidney disease stage IIIa.  Patient presents to the emergency department complaining of shortness of breath and has been admitted for Acute on chronic respiratory failure with hypoxia [facility]) [J96.21]  Patient is known to our practice from outpatient follow-up.  Patient was last seen in office on 07/01/2022 for routine follow-up.    Patient seen sitting up in bed, wife at bedside Alert and oriented Remains on 4 L Generalized edema slowly improving  Creatinine slowly improving. Urine output 4.1 L in 24 hours.  IV furosemide drip at 4 mg/h  Objective:  Vital signs in last 24 hours:  Temp:  [98.1 F (36.7 C)-99.2 F (37.3 C)] 98.1 F (36.7 C) (09/01 0729) Pulse Rate:  [58-87] 69 (09/01 0729) Resp:  [16-18] 16 (09/01 0729) BP: (115-155)/(46-66) 116/54 (09/01 0729) SpO2:  [83 %-95 %] 95 % (09/01 0756) Weight:  [105.2 kg] 105.2 kg (09/01 0500)  Weight change: 0.2 kg Filed Weights   06/23/23 0706 06/25/23 0500 06/26/23 0500  Weight: 103.1 kg 105 kg 105.2 kg    Intake/Output: I/O last 3 completed shifts: In: 1430 [P.O.:1380; IV Piggyback:50] Out: 4150 [Urine:4150]   Intake/Output this shift:  Total I/O In: -  Out: 300 [Urine:300]  Physical Exam: General: NAD  Head: Normocephalic, atraumatic. Moist oral mucosal membranes  Eyes: Anicteric  Lungs:  Basilar wheeze, 4 L [place]  Heart: Regular rate and rhythm  Abdomen:  Soft, nontender, nondistended  Extremities: 2+ dependent peripheral edema.  Neurologic: Alert and oriented, moving all four extremities  Skin: No lesions  GU Foley catheter    Basic Metabolic Panel: Recent Labs  Lab  06/22/23 0619 06/22/23 1649 06/23/23 0531 06/23/23 1624 06/24/23 0432 06/25/23 0539 06/26/23 0516  NA 122*   < > 125* 128* 132* 135 136  K 4.6   < > 4.3 4.1 3.9 3.3* 3.1*  CL 88*   < > 90* 93* 94* 100 102  CO2 25   < > 26 23 25 24 26   GLUCOSE 162*   < > 148* 192* 104* 115* 83  BUN 34*   < > 48* 54* 57* 57* 52*  CREATININE 1.30*   < > 1.44* 1.42* 1.53* 1.44* 1.26*  CALCIUM 7.6*   < > 7.9* 8.0* 8.9 7.8* 7.8*  MG 1.9  --  2.0  --  2.0 1.9 1.6*  PHOS 2.9  --  2.8  --  2.9 2.7 2.9   < > = values in this interval not displayed.    Liver Function Tests: Recent Labs  Lab 06/21/23 1504 06/24/23 0432 06/25/23 0539 06/26/23 0516  AST 27 21 27 18   ALT 10 12 12 12   ALKPHOS 95 61 57 58  BILITOT 0.9 0.8 0.9 1.0  PROT 6.6 5.5* 5.1* 5.1*  ALBUMIN 2.1* 1.9* 2.1* 2.3*   No results for input(s): "LIPASE", "AMYLASE" in the last 168 hours. Recent Labs  Lab 06/21/23 2019  AMMONIA 26    CBC: Recent Labs  Lab 06/20/23 1330 06/20/23 1330 06/21/23 1504 06/22/23 0619 06/23/23 0531 06/23/23 1624 06/24/23 0432 06/24/23 1636 06/25/23 0539 06/25/23 1334 06/26/23 0516  WBC 8.5   < > 9.1 3.8* 5.8  --  7.1  --  7.2  --  6.3  NEUTROABS 7.0  --  7.1  --   --   --   --   --   --   --   --   HGB 8.7*   < > 8.8* 7.4* 6.3*   < > 6.9* 9.2* 8.0* 8.9* 8.2*  HCT 26.8*  --  26.7* 22.4* 19.0*   < > 20.5* 28.6* 24.0* 27.4* 24.5*  MCV 86.7  --  86.1 85.5 84.4  --  83.3  --  83.9  --  84.2  PLT 286   < > 326 264 278  --  304  --  283  --  266   < > = values in this interval not displayed.    Cardiac Enzymes: No results for input(s): "CKTOTAL", "CKMB", "CKMBINDEX", "TROPONINI" in the last 168 hours.  BNP: Invalid input(s): "POCBNP"  CBG: Recent Labs  Lab 06/25/23 0838 06/25/23 1240 06/25/23 1740 06/25/23 2048 06/26/23 0807  GLUCAP 89 99 163* 172* 93    Microbiology: Results for orders placed or performed during the hospital encounter of 06/21/23  Culture, blood (Routine x 2)      Status: None   Collection Time: 06/21/23  3:02 PM   Specimen: BLOOD  Result Value Ref Range Status   Specimen Description BLOOD LEFT ANTECUBITAL  Final   Special Requests   Final    BOTTLES DRAWN AEROBIC AND ANAEROBIC Blood Culture adequate volume   Culture   Final    NO GROWTH 5 DAYS Performed at [facility], [address], [place], [place] [address]    Report Status 06/26/2023 FINAL  Final  Culture, blood (Routine x 2)     Status: None   Collection Time: 06/21/23  3:13 PM   Specimen: BLOOD  Result Value Ref Range Status   Specimen Description BLOOD RIGHT ANTECUBITAL  Final   Special Requests   Final    BOTTLES DRAWN AEROBIC AND ANAEROBIC Blood Culture adequate volume   Culture   Final    NO GROWTH 5 DAYS Performed at [facility], [address] [place], [place] [address]    Report Status 06/26/2023 FINAL  Final  Resp panel by RT-PCR (RSV, Flu A&B, Covid) Anterior Nasal Swab     Status: None   Collection Time: 06/21/23  4:35 PM   Specimen: Anterior Nasal Swab  Result Value Ref Range Status   SARS Coronavirus 2 by RT PCR NEGATIVE NEGATIVE Final    Comment: (NOTE) SARS-CoV-2 target nucleic acids are NOT DETECTED.  The SARS-CoV-2 RNA is generally detectable in upper respiratory specimens during the acute phase of infection. The lowest concentration of SARS-CoV-2 viral copies this assay can detect is 138 copies/mL. A negative result does not preclude SARS-Cov-2 infection and should not be used as the sole basis for treatment or other patient management decisions. A negative result may occur with  improper specimen collection/handling, submission of specimen other than nasopharyngeal swab, presence of viral mutation(s) within the areas targeted by this assay, and inadequate number of viral copies(<138 copies/mL). A negative result must be combined with clinical observations, patient history, and epidemiological information. The expected result is  Negative.  Fact Sheet for Patients:  BloggerCourse.com  Fact Sheet for Healthcare Providers:  SeriousBroker.it  This test is no t yet approved or cleared by the [place] FDA and  has been authorized for detection and/or diagnosis of SARS-CoV-2 by FDA under an Emergency Use Authorization (EUA). This EUA will remain  in effect (meaning this test can be  used) for the duration of the COVID-19 declaration under Section 564(b)(1) of the Act, 21 U.S.C.section 360bbb-3(b)(1), unless the authorization is terminated  or revoked sooner.       Influenza A by PCR NEGATIVE NEGATIVE Final   Influenza B by PCR NEGATIVE NEGATIVE Final    Comment: (NOTE) The Xpert Xpress SARS-CoV-2/FLU/RSV plus assay is intended as an aid in the diagnosis of influenza from Nasopharyngeal swab specimens and should not be used as a sole basis for treatment. Nasal washings and aspirates are unacceptable for Xpert Xpress SARS-CoV-2/FLU/RSV testing.  Fact Sheet for Patients: BloggerCourse.com  Fact Sheet for Healthcare Providers: SeriousBroker.it  This test is not yet approved or cleared by the [place] FDA and has been authorized for detection and/or diagnosis of SARS-CoV-2 by FDA under an Emergency Use Authorization (EUA). This EUA will remain in effect (meaning this test can be used) for the duration of the COVID-19 declaration under Section 564(b)(1) of the Act, 21 U.S.C. section 360bbb-3(b)(1), unless the authorization is terminated or revoked.     Resp Syncytial Virus by PCR NEGATIVE NEGATIVE Final    Comment: (NOTE) Fact Sheet for Patients: BloggerCourse.com  Fact Sheet for Healthcare Providers: SeriousBroker.it  This test is not yet approved or cleared by the [place] FDA and has been authorized for detection and/or diagnosis of  SARS-CoV-2 by FDA under an Emergency Use Authorization (EUA). This EUA will remain in effect (meaning this test can be used) for the duration of the COVID-19 declaration under Section 564(b)(1) of the Act, 21 U.S.C. section 360bbb-3(b)(1), unless the authorization is terminated or revoked.  Performed at [facility], [address] [place], [place] [address]   MRSA Next Gen by PCR, Nasal     Status: Abnormal   Collection Time: 06/21/23  8:14 PM   Specimen: Nasal Mucosa; Nasal Swab  Result Value Ref Range Status   MRSA by PCR Next Gen DETECTED (A) NOT DETECTED Final    Comment: RESULT CALLED TO, READ BACK BY AND VERIFIED WITH: KRISTY SNEEAD AT 2206 ON 06/21/23 BY SS (NOTE) The GeneXpert MRSA Assay (FDA approved for NASAL specimens only), is one component of a comprehensive MRSA colonization surveillance program. It is not intended to diagnose MRSA infection nor to guide or monitor treatment for MRSA infections. Test performance is not FDA approved in patients less than 41 years old. Performed at [facility], [address] [place], [place] [address]   Respiratory (~20 pathogens) panel by PCR     Status: None   Collection Time: 06/22/23  3:22 PM   Specimen: Nasopharyngeal Swab; Respiratory  Result Value Ref Range Status   Adenovirus NOT DETECTED NOT DETECTED Final   Coronavirus 229E NOT DETECTED NOT DETECTED Final    Comment: (NOTE) The Coronavirus on the Respiratory Panel, DOES NOT test for the novel  Coronavirus (2019 nCoV)    Coronavirus HKU1 NOT DETECTED NOT DETECTED Final   Coronavirus NL63 NOT DETECTED NOT DETECTED Final   Coronavirus OC43 NOT DETECTED NOT DETECTED Final   Metapneumovirus NOT DETECTED NOT DETECTED Final   Rhinovirus / Enterovirus NOT DETECTED NOT DETECTED Final   Influenza A NOT DETECTED NOT DETECTED Final   Influenza B NOT DETECTED NOT DETECTED Final   Parainfluenza Virus 1 NOT DETECTED NOT DETECTED Final   Parainfluenza Virus 2  NOT DETECTED NOT DETECTED Final   Parainfluenza Virus 3 NOT DETECTED NOT DETECTED Final   Parainfluenza Virus 4 NOT DETECTED NOT DETECTED Final   Respiratory Syncytial Virus  NOT DETECTED NOT DETECTED Final   Bordetella pertussis NOT DETECTED NOT DETECTED Final   Bordetella Parapertussis NOT DETECTED NOT DETECTED Final   Chlamydophila pneumoniae NOT DETECTED NOT DETECTED Final   Mycoplasma pneumoniae NOT DETECTED NOT DETECTED Final    Comment: Performed at [facility], [address], [place], [place] [address]  Body fluid culture w Gram Stain     Status: None (Preliminary result)   Collection Time: 06/24/23 12:12 PM   Specimen: PATH Cytology Peritoneal fluid  Result Value Ref Range Status   Specimen Description   Final    PERITONEAL Performed at [facility], [address] [place], [place] [address]    Special Requests   Final    NONE Performed at [facility], [address] [place], [place] [address]    Gram Stain NO WBC SEEN NO ORGANISMS SEEN   Final   Culture   Final    NO GROWTH 2 DAYS Performed at [facility], [address], [place], [place] [address]    Report Status PENDING  Incomplete    Coagulation Studies: Recent Labs    06/24/23 0432  LABPROT 18.5*  INR 1.5*    Urinalysis: No results for input(s): "COLORURINE", "LABSPEC", "PHURINE", "GLUCOSEU", "HGBUR", "BILIRUBINUR", "KETONESUR", "PROTEINUR", "UROBILINOGEN", "NITRITE", "LEUKOCYTESUR" in the last 72 hours.  Invalid input(s): "APPERANCEUR"    Imaging: CT CHEST WO CONTRAST  Result Date: 06/26/2023 CLINICAL DATA:  Pneumonia. EXAM: CT CHEST WITHOUT CONTRAST TECHNIQUE: Multidetector CT imaging of the chest was performed following the standard protocol without IV contrast. RADIATION DOSE REDUCTION: This exam was performed according to the departmental dose-optimization program which includes automated exposure control, adjustment of the mA and/or kV according to patient size  and/or use of iterative reconstruction technique. COMPARISON:  08/18/2022 FINDINGS: Cardiovascular: Heart is mildly enlarged. No substantial pericardial effusion. Coronary artery calcification is evident. Mild atherosclerotic calcification is noted in the wall of the thoracic aorta. Enlargement of the pulmonary outflow tract/main pulmonary arteries suggests pulmonary arterial hypertension. Mediastinum/Nodes: Scattered upper normal mediastinal lymph nodes evident. No evidence for gross hilar lymphadenopathy although assessment is limited by the lack of intravenous contrast on the current study. Mild circumferential wall thickening noted distal esophagus. There is no axillary lymphadenopathy. Lungs/Pleura: Peripheral right upper lobe pulmonary nodule measured previously at 1.4 x 1.3 cm is now 1.5 x 1.2 cm on image 62/4. Bilateral lower lobe collapse/consolidation is new in the interval with patchy areas of ground-glass opacity in the left upper lobe. New moderate right and tiny left pleural effusions. Sub pulmonic component to the left pleural effusion. Upper Abdomen: Nodular liver contour is compatible with cirrhosis. Musculoskeletal: No worrisome lytic or sclerotic osseous abnormality. IMPRESSION: 1. Interval development of bilateral lower lobe collapse/consolidation with patchy areas of ground-glass opacity in the left upper lobe. Imaging features compatible with multifocal pneumonia. 2. New moderate right and tiny left pleural effusions. 3. Similar peripheral right upper lobe pulmonary nodule. This lesion showed no appreciable hypermetabolism on PET-CT of 11/30/2021. 4. Cirrhosis. 5.  Aortic Atherosclerosis (ICD10-I70.0). Electronically Signed   By: Kennith Center M.D.   On: 06/26/2023 10:21   US THORACENTESIS ASP PLEURAL SPACE W/IMG GUIDE  Result Date: 06/24/2023 INDICATION: Right pleural effusion. Request received for diagnostic and therapeutic thoracentesis. EXAM: ULTRASOUND GUIDED RIGHT THORACENTESIS  MEDICATIONS: 8 cc 1% lidocaine COMPLICATIONS: None immediate. PROCEDURE: An ultrasound guided thoracentesis was thoroughly discussed with the patient and questions answered. The benefits, risks, alternatives and complications were also discussed. The  patient understands and wishes to proceed with the procedure. Written consent was obtained. Ultrasound was performed to localize and mark an adequate pocket of fluid in the RIGHT chest. The area was then prepped and draped in the normal sterile fashion. 1% Lidocaine was used for local anesthesia. Under ultrasound guidance a 6 Fr Safe-T-Centesis catheter was introduced. Thoracentesis was performed. The catheter was removed and a dressing applied. FINDINGS: A total of approximately 1.2 L of serosanguineous fluid was removed. Samples were sent to the laboratory as requested by the clinical team. IMPRESSION: Successful ultrasound guided diagnostic RIGHT thoracentesis yielding 1.2 L of pleural fluid. Follow-up chest x-ray revealed no evidence of pneumothorax. Procedure performed by Mina Marble, PA-C Electronically Signed   By: Roanna Banning M.D.   On: 06/24/2023 12:57   DG Chest Port 1 View  Result Date: 06/24/2023 CLINICAL DATA:  Status post thoracentesis for right pleural effusion. EXAM: PORTABLE CHEST 1 VIEW COMPARISON:  06/21/2023 FINDINGS: Stable cardiomediastinal contours. The right pleural effusion has decreased in volume from the previous exam. No pneumothorax identified. Diffuse increase interstitial markings are identified bilaterally, similar to previous exam. Nodular opacity within the right upper lobe is again noted measuring 1.3 cm. This is been evaluated with prior PET-CT showing no significant uptake favoring a benign nodule. IMPRESSION: 1. Interval decrease in volume of right pleural effusion. No pneumothorax identified. 2. Increase interstitial markings favored to represent pulmonary vascular congestion/mild edema. Electronically Signed   By: Signa Kell M.D.   On: 06/24/2023 12:01     Medications:    furosemide (LASIX) 200 mg in dextrose 5 % 100 mL (2 mg/mL) infusion 4 mg/hr (06/24/23 0756)   potassium chloride 10 mEq (06/26/23 1017)    sodium chloride   Intravenous Once   vitamin C  500 mg Oral Daily   Chlorhexidine Gluconate Cloth  6 each Topical Daily   folic acid  1 mg Oral Daily   gemfibrozil  600 mg Oral BID AC   guaiFENesin  600 mg Oral BID   insulin aspart  0-5 Units Subcutaneous QHS   insulin aspart  0-9 Units Subcutaneous TID WC   insulin glargine-yfgn  7 Units Subcutaneous QHS   Interferon Beta-1b  0.25 mg Subcutaneous QODAY   iron polysaccharides  150 mg Oral Daily   loratadine  10 mg Oral Daily   nystatin   Topical TID   mouth rinse  15 mL Mouth Rinse 4 times per day   mouth rinse  15 mL Mouth Rinse 4 times per day   pantoprazole  40 mg Oral BID   potassium chloride  20 mEq Oral BID   pregabalin  200 mg Oral BID   sertraline  25 mg Oral Daily   simvastatin  40 mg Oral q1800   sodium chloride  2 g Oral TID WC   tamsulosin  0.4 mg Oral QPC supper   Vitamin D (Ergocalciferol)  50,000 Units Oral Q7 days   acetaminophen, albuterol, guaiFENesin-dextromethorphan, hydrALAZINE, ondansetron (ZOFRAN) IV, mouth rinse, mouth rinse, traMADol  Assessment/ Plan:  Jeremy [name] is a 73 y.o.  male with past medical conditions including hyperlipidemia, diabetes, hypertension, respiratory failure, COPD with 4 L nasal cannula at baseline, depression, diastolic heart failure, multiple sclerosis, liver cirrhosis, anemia, pulmonary hypertension, and chronic kidney disease stage IIIa.  Patient presents to the emergency department complaining of shortness of breath and has been admitted for Acute on chronic respiratory failure with hypoxia (HCC) [J96.21]   Chronic kidney disease stage IIIa,  patient currently at baseline renal function. Chronic kidney disease is secondary to diabetes, hypertension, advanced age Creatinine  remained stable with adequate response to diuretic therapy.  Continue diuresis at prescribed rate.  Lab Results  Component Value Date   CREATININE 1.26 (H) 06/26/2023   CREATININE 1.44 (H) 06/25/2023   CREATININE 1.53 (H) 06/24/2023    Intake/Output Summary (Last 24 hours) at 06/26/2023 1132 Last data filed at 06/26/2023 0717 Gross per 24 hour  Intake 390 ml  Output 2600 ml  Net -2210 ml   2.  Volume overload, appears multifactorial with contributing factors of COPD    - Patient receiving supportive care with Solu-Medrol and diuresis.  Fluid restriction and symptom management. - Still appear to have dependent edema over thighs.  Continued on IV furosemide at 4 mg/h with good response.  3. Anemia of chronic kidney disease Lab Results  Component Value Date   HGB 8.2 (L) 06/26/2023    Hemoglobin improved with 1 unit blood transfusion this admission.  4.  Diabetes mellitus type II with chronic kidney disease/renal manifestations: insulin dependent. Home regimen includes Lantus. Most recent hemoglobin A1c is 4.4 on 11/24/22.     LOS: 5 Jeremy [name] 9/1/202411:32 AM

## 2023-06-26 NOTE — Plan of Care (Addendum)
Patient remains in [facility]-ICU at time of writing. Patient is requiring 10 L/min of supplemental 02 via HFNC. Continuous infusion of Lasix via PIV. Chronic foley remains in place. Pulmonary hygiene encouraged.  Edit 2054 hrs: Call received from [facility] lab; MRSA PCR (+) for this patient. Dr. Para March notified. MRSA (+) standing orders entered.  Problem: Education: Goal: Ability to describe self-care measures that may prevent or decrease complications (Diabetes Survival Skills Education) will improve Outcome: Progressing Goal: Individualized Educational Video(s) Outcome: Progressing   Problem: Coping: Goal: Ability to adjust to condition or change in health will improve Outcome: Progressing   Problem: Fluid Volume: Goal: Ability to maintain a balanced intake and output will improve Outcome: Progressing   Problem: Health Behavior/Discharge Planning: Goal: Ability to identify and utilize available resources and services will improve Outcome: Progressing Goal: Ability to manage health-related needs will improve Outcome: Progressing   Problem: Metabolic: Goal: Ability to maintain appropriate glucose levels will improve Outcome: Progressing   Problem: Nutritional: Goal: Maintenance of adequate nutrition will improve Outcome: Progressing Goal: Progress toward achieving an optimal weight will improve Outcome: Progressing   Problem: Skin Integrity: Goal: Risk for impaired skin integrity will decrease Outcome: Progressing   Problem: Tissue Perfusion: Goal: Adequacy of tissue perfusion will improve Outcome: Progressing   Problem: Education: Goal: Ability to demonstrate management of disease process will improve Outcome: Progressing Goal: Ability to verbalize understanding of medication therapies will improve Outcome: Progressing Goal: Individualized Educational Video(s) Outcome: Progressing   Problem: Activity: Goal: Capacity to carry out activities will improve Outcome: Progressing    Problem: Cardiac: Goal: Ability to achieve and maintain adequate cardiopulmonary perfusion will improve Outcome: Progressing   Problem: Education: Goal: Knowledge of disease or condition will improve Outcome: Progressing Goal: Knowledge of the prescribed therapeutic regimen will improve Outcome: Progressing Goal: Individualized Educational Video(s) Outcome: Progressing   Problem: Activity: Goal: Ability to tolerate increased activity will improve Outcome: Progressing Goal: Will verbalize the importance of balancing activity with adequate rest periods Outcome: Progressing   Problem: Respiratory: Goal: Ability to maintain a clear airway will improve Outcome: Progressing Goal: Levels of oxygenation will improve Outcome: Progressing Goal: Ability to maintain adequate ventilation will improve Outcome: Progressing   Problem: Activity: Goal: Ability to tolerate increased activity will improve Outcome: Progressing   Problem: Clinical Measurements: Goal: Ability to maintain a body temperature in the normal range will improve Outcome: Progressing   Problem: Respiratory: Goal: Ability to maintain adequate ventilation will improve Outcome: Progressing Goal: Ability to maintain a clear airway will improve Outcome: Progressing   Problem: Education: Goal: Knowledge of General Education information will improve Description: Including pain rating scale, medication(s)/side effects and non-pharmacologic comfort measures Outcome: Progressing   Problem: Health Behavior/Discharge Planning: Goal: Ability to manage health-related needs will improve Outcome: Progressing   Problem: Clinical Measurements: Goal: Ability to maintain clinical measurements within normal limits will improve Outcome: Progressing Goal: Will remain free from infection Outcome: Progressing Goal: Diagnostic test results will improve Outcome: Progressing Goal: Respiratory complications will improve Outcome:  Progressing Goal: Cardiovascular complication will be avoided Outcome: Progressing   Problem: Activity: Goal: Risk for activity intolerance will decrease Outcome: Progressing   Problem: Nutrition: Goal: Adequate nutrition will be maintained Outcome: Progressing   Problem: Coping: Goal: Level of anxiety will decrease Outcome: Progressing   Problem: Elimination: Goal: Will not experience complications related to bowel motility Outcome: Progressing Goal: Will not experience complications related to urinary retention Outcome: Progressing   Problem: Pain Managment: Goal: General experience of comfort will  improve Outcome: Progressing   Problem: Safety: Goal: Ability to remain free from injury will improve Outcome: Progressing   Problem: Skin Integrity: Goal: Risk for impaired skin integrity will decrease Outcome: Progressing

## 2023-06-27 ENCOUNTER — Inpatient Hospital Stay: Payer: Medicare Other

## 2023-06-27 ENCOUNTER — Other Ambulatory Visit: Payer: Self-pay

## 2023-06-27 DIAGNOSIS — J9621 Acute and chronic respiratory failure with hypoxia: Secondary | ICD-10-CM | POA: Diagnosis not present

## 2023-06-27 LAB — COMPREHENSIVE METABOLIC PANEL
ALT: 13 U/L (ref 0–44)
AST: 21 U/L (ref 15–41)
Albumin: 2.7 g/dL — ABNORMAL LOW (ref 3.5–5.0)
Alkaline Phosphatase: 66 U/L (ref 38–126)
Anion gap: 10 (ref 5–15)
BUN: 46 mg/dL — ABNORMAL HIGH (ref 8–23)
CO2: 28 mmol/L (ref 22–32)
Calcium: 8.1 mg/dL — ABNORMAL LOW (ref 8.9–10.3)
Chloride: 102 mmol/L (ref 98–111)
Creatinine, Ser: 1.25 mg/dL — ABNORMAL HIGH (ref 0.61–1.24)
GFR, Estimated: >60 mL/min (ref >60)
Glucose, Bld: 111 mg/dL — ABNORMAL HIGH (ref 70–99)
Potassium: 3.4 mmol/L — ABNORMAL LOW (ref 3.5–5.1)
Sodium: 140 mmol/L (ref 135–145)
Total Bilirubin: 1.3 mg/dL — ABNORMAL HIGH (ref 0.3–1.2)
Total Protein: 6.2 g/dL — ABNORMAL LOW (ref 6.5–8.1)

## 2023-06-27 LAB — MAGNESIUM: Magnesium: 1.9 mg/dL (ref 1.7–2.4)

## 2023-06-27 LAB — CBC
HCT: 29.1 % — ABNORMAL LOW (ref 39.0–52.0)
Hemoglobin: 9.1 g/dL — ABNORMAL LOW (ref 13.0–17.0)
MCH: 27.3 pg (ref 26.0–34.0)
MCHC: 31.3 g/dL (ref 30.0–36.0)
MCV: 87.4 fL (ref 80.0–100.0)
Platelets: 272 10*3/uL (ref 150–400)
RBC: 3.33 MIL/uL — ABNORMAL LOW (ref 4.22–5.81)
RDW: 17.4 % — ABNORMAL HIGH (ref 11.5–15.5)
WBC: 7.2 10*3/uL (ref 4.0–10.5)
nRBC: 0 % (ref 0.0–0.2)

## 2023-06-27 LAB — BODY FLUID CULTURE W GRAM STAIN
Culture: NO GROWTH
Gram Stain: NONE SEEN

## 2023-06-27 LAB — GLUCOSE, CAPILLARY
Glucose-Capillary: 70 mg/dL (ref 70–99)
Glucose-Capillary: 155 mg/dL — ABNORMAL HIGH (ref 70–99)
Glucose-Capillary: 154 mg/dL — ABNORMAL HIGH (ref 70–99)
Glucose-Capillary: 148 mg/dL — ABNORMAL HIGH (ref 70–99)

## 2023-06-27 LAB — PHOSPHORUS: Phosphorus: 2.5 mg/dL (ref 2.5–4.6)

## 2023-06-27 LAB — TROPONIN I (HIGH SENSITIVITY)
Troponin I (High Sensitivity): 60 ng/L — ABNORMAL HIGH (ref <18)
Troponin I (High Sensitivity): 55 ng/L — ABNORMAL HIGH (ref <18)
Troponin I (High Sensitivity): 52 ng/L — ABNORMAL HIGH (ref <18)

## 2023-06-27 LAB — D-DIMER, QUANTITATIVE: D-Dimer, Quant: 7.57 ug{FEU}/mL — ABNORMAL HIGH (ref 0.00–0.50)

## 2023-06-27 LAB — PROTIME-INR
INR: 1.6 — ABNORMAL HIGH (ref 0.8–1.2)
Prothrombin Time: 19.5 s — ABNORMAL HIGH (ref 11.4–15.2)

## 2023-06-27 LAB — BRAIN NATRIURETIC PEPTIDE: B Natriuretic Peptide: 1328.4 pg/mL — ABNORMAL HIGH (ref 0.0–100.0)

## 2023-06-27 MED ORDER — POTASSIUM CHLORIDE 20 MEQ PO PACK
40.0000 meq | PACK | Freq: Once | ORAL | Status: AC
  Administered 2023-06-27: 40 meq via ORAL
  Filled 2023-06-27: qty 2

## 2023-06-27 MED ORDER — FLUTICASONE PROPIONATE 50 MCG/ACT NA SUSP
1.0000 | Freq: Every day | NASAL | Status: DC
  Administered 2023-06-27 – 2023-07-05 (×9): 1 via NASAL
  Filled 2023-06-27: qty 16

## 2023-06-27 MED ORDER — IOHEXOL 350 MG/ML SOLN
75.0000 mL | Freq: Once | INTRAVENOUS | Status: AC | PRN
  Administered 2023-06-27: 75 mL via INTRAVENOUS

## 2023-06-27 MED ORDER — ALBUMIN HUMAN 25 % IV SOLN
12.5000 g | Freq: Two times a day (BID) | INTRAVENOUS | Status: AC
  Administered 2023-06-27 – 2023-06-29 (×4): 12.5 g via INTRAVENOUS
  Filled 2023-06-27 (×5): qty 50

## 2023-06-27 NOTE — Progress Notes (Signed)
[facility] Progress Note  Patient: Jeremy [name]    JYN:[id number]  DOA: 06/21/2023     Date of Service: the patient was seen and examined on 06/27/2023  Chief Complaint  Patient presents with   Shortness of Breath   Brief hospital course: Alyk Sheneman is a 72 y.o. male with medical history significant of HTN, HLD, DM, chronic respiratory failure, COPD on 4L O2, dCHF, GIB, depression, MS (wheelchair-bound), pulmonary hypertension, chronic pain, liver cirrhosis, obesity, anemia, who presents with SOB.   Patient was recently hospitalized from 7/12 - 7/26 due to CHF exacerbation and right lobar pneumonia. Patient states that he has worsening shortness of breath in the past several days, which has been progressively worsening.  Patient has cough with clear thick mucus production.  Denies chest pain, fever or chills.  Patient also has worsening bilateral leg edema and 13 pounds of weight gain recently.   Patient is normally using 4L of oxygen, but was found to have severe respiratory distress, using accessory muscle for breathing in ED, oxygen saturation 88% on 5 L oxygen.  Patient was given 1 dose of Lasix to 40 mg, 125 mg of Solu-Medrol and nebulizer treatment in ED. He feels better.  Currently on 8 L of oxygen with 98% of saturation.  Patient had urinary retention with Foley catheter placed recently.  After removal of Foley catheter, patient had bladder scan in PCPs office 2 weeks ago, which showed urinary retention, so Foley catheter was placed again.    Data reviewed independently and ED Course: pt was found to have BMP for 65.9, troponin level 21 --> 20, lactic acid of 9.6, WBC 9.1, INR 1.3, sodium 120, stable renal function, temperature normal, blood pressure 125/49, heart rate 80s.  VBG with pH 7.42, CO2 46, O2 70.8.  Chest x-ray showed moderate right pleural effusion.  Patient is admitted to stepdown as inpatient.     Assessment and Plan:  # Acute on chronic hypoxic respiratory  failure most likely due to CHF exacerbation Patient uses 4 L oxygen at baseline Continue supplemental O2 admission and gradually wean down 9/1 CT chest: Bilateral lung collapse, multifocal pneumonia, bilateral effusion R >L 9/1 started Unasyn, pharmacy consulted and doxycycline 100 mg IV twice daily for aspiration PNA and to cover MRSA as well 9/2 patient's respiratory status got worse on 9/1, moved to stepdown unit, PCCM was consulted.  currently on 10 L oxygen HHFNC Advised to continue incentive spirometry Patient may benefit from repeat thoracentesis. Continue IV Lasix for diuresis, started albumin as below.   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  As per patient's wife patient cannot have more procedures done due to respiratory failure and he may not wake up.  Patient and his wife would prefer conservative management.  8/30 Hb 6.9, 1 unit PRBC  transfused. 9/2 Hb 9.1 stable  -Follow-up with CBC   # Stage 3a chronic kidney disease  Recent baseline 0.90 1.2-1.4.   creatinine 1.25 stable on current diuresis Monitor renal function and urine output S/p IV albumin as above Nephrology consulted for further recommendation   # COPD exacerbation, most likely due to  volume overload S/p Solu-Medrol 125 mg given in the ED,  s/p Solu-Medrol 40 mg IV BID, DC'd on 8/29  S/p  doxycycline 100 mg p.o. twice daily d/c'd on 8/30 Started Mucinex 600 mg p.o. twice daily Continue Robitussin DM as needed   # Pleural effusion Moderate right pleural effusion most likely due to CHF, liver cirrhosis with hypoalbuminemia Continue diuresis as above 8/30 s/p thoracentesis 1.2 L fluid was tapped by IR, postprocedure x-ray did not show any complications. Fluid studies, as per lights criteria, LDH elevated, consistent with exudative but clinically no signs of infection. Started Abx due to CT scan findings of multifocal pneumonia. 9/1 CT chest shows bilateral effusion, right is moderate. 9/2 follow IR for repeat thoracentesis   IDDM T2 Held home regimen for now Continue Semglee 7 units nightly NovoLog sliding scale Monitor CBG, continue diabetic diet Risk of hyperglycemia due to steroids   Essential hypertension -IV hydralazine as needed -Patient is on IV Lasix   Hyperlipidemia -Zocor and lopid   Multiple sclerosis (HCC) -pt is on Betaseron injection -Fall precaution   Myocardial injury: troponin level 21 --> -20.  No chest pain -Continue Zocor   Folic acid deficiency, folate level 5.2 Started folate supplement.  Hypokalemia secondary to diuresis, potassium repleted. Monitor electrolytes daily  Hypotonic hyponatremia: Resolved Serum osmolality 265 low Na 120.  Mental status normal. Sodium chloride 2 g p.o. 3 times daily for 5 days Continue fluid restriction 1200 ml/d Na 120---132 --136 Check BMP daily now     # Cirrhosis of liver without ascites (HCC): Mental status normal.  INR 1.3 -ammonia level 26 wnl -INR 1.3, PT 16.7, PTT 42, slightly elevated    # Chronic urinary retention: Has a Foley catheter in place since last admission -Flomax Patient has an appointment with urology on 06/29/2023 for voiding trial.  Since patient is here in the  hospital, we will remove Foley for voiding trial before discharge planning.    # Depression -Continue home medication, Zoloft  Vitamin D deficiency: started vitamin D 50,000 units p.o. weekly, follow with PCP to repeat vitamin D level after 3 to 6 months.  # Obesity Body mass index is 30.53 kg/m.  Interventions:  Diet: Heart healthy/carb modified, fluid striction 1200 mL/day DVT Prophylaxis: Subcutaneous Lovenox   Advance goals of care discussion: Limited code  Family Communication: family was present at bedside, at the time of interview.  The pt provided permission to discuss medical plan with the family. Opportunity was given to ask question and all questions were answered satisfactorily.   Disposition:  Pt is from home, admitted with hyponatremia, anasarca, diastolic CHF, acute on chronic hypoxic respiratory failure, started IV Lasix infusion, which precludes a safe discharge. Discharge to SNF, when stable.  Subjective: Last night patient had chest pain, troponin negative.  Yesterday evening patient's respiratory status got worse and patient was on 10 L oxygen via heated high flow nasal cannula.  Patient was transferred to stepdown unit.  PCCM was consulted. Patient still having significant respiratory distress, on 2 L oxygen via nasal cannula.  Edema is resolving.  Denied any complaints.   Physical Exam: General: Mild--Mod respiratory distress, Laying comfortably. Appear  in mild- mod distress, affect appropriate depressed Eyes: PERRLA ENT: Oral Mucosa Clear, moist  Neck: no JVD,  Cardiovascular: S1 and S2 Present, no Murmur,  Respiratory: Equal air entry bilaterally, bilateral crackles and decreased breath sounds in the bibasilar area more decreased on the right lower area due to pleural effusion Abdomen: Bowel Sound present, Soft and no tenderness,  Skin: no rashes Extremities: 2-3+ edema mostly involving dorsum of feet and ankle,  no calf tenderness, edema gradually  improving Neurologic: without any new focal findings Gait not checked due to patient safety concerns  Vitals:   06/27/23 0500 06/27/23 0520 06/27/23 0600 06/27/23 0700  BP:  (!) 147/56 (!) 139/52 (!) 130/41  Pulse: 77 85 80 79  Resp: (!) 23 (!) 29 16 16   Temp:      TempSrc:      SpO2: 92% 90% 97% 94%  Weight:  96.5 kg    Height:        Intake/Output Summary (Last 24 hours) at 06/27/2023 0837 Last data filed at 06/27/2023 0700 Gross per 24 hour  Intake 2411.69 ml  Output 3740 ml  Net -1328.31 ml   Filed Weights   06/25/23 0500 06/26/23 0500 06/27/23 0520  Weight: 105 kg 105.2 kg 96.5 kg    Data Reviewed: I have personally reviewed and interpreted daily labs, tele strips, imagings as discussed above. I reviewed all nursing notes, pharmacy notes, vitals, pertinent old records I have discussed plan of care as described above with RN and patient/family.  CBC: Recent Labs  Lab 06/20/23 1330 06/21/23 1504 06/22/23 0619 06/23/23 0531 06/23/23 1624 06/24/23 0432 06/24/23 1636 06/25/23 0539 06/25/23 1334 06/26/23 0516 06/26/23 1651 06/27/23 0308  WBC 8.5 9.1   < > 5.8  --  7.1  --  7.2  --  6.3  --  7.2  NEUTROABS 7.0 7.1  --   --   --   --   --   --   --   --   --   --   HGB 8.7* 8.8*   < > 6.3*   < > 6.9*   < > 8.0* 8.9* 8.2* 10.4* 9.1*  HCT 26.8* 26.7*   < > 19.0*   < > 20.5*   < > 24.0* 27.4* 24.5* 32.5* 29.1*  MCV 86.7 86.1   < > 84.4  --  83.3  --  83.9  --  84.2  --  87.4  PLT 286 326   < > 278  --  304  --  283  --  266  --  272   < > = values in this interval not displayed.   Basic Metabolic Panel: Recent Labs  Lab 06/23/23 0531 06/23/23 1624 06/24/23 0432 06/25/23 0539 06/26/23 0516 06/27/23 0308  NA 125* 128* 132* 135 136 140  K 4.3 4.1 3.9 3.3* 3.1* 3.4*  CL 90* 93* 94* 100 102 102  CO2 26 23 25 24 26 28   GLUCOSE 148* 192* 104* 115* 83 111*  BUN 48* 54* 57* 57* 52* 46*  CREATININE 1.44* 1.42* 1.53* 1.44* 1.26* 1.25*  CALCIUM 7.9* 8.0* 8.9 7.8*  7.8* 8.1*  MG 2.0  --  2.0 1.9 1.6* 1.9  PHOS 2.8  --  2.9 2.7 2.9 2.5    Studies: CT Angio Chest Pulmonary Embolism (PE) W or WO Contrast  Result Date: 06/27/2023 CLINICAL DATA:  72 year old male with history of positive D-dimer. Low to intermediate probability of pulmonary embolism. EXAM: CT ANGIOGRAPHY CHEST  WITH CONTRAST TECHNIQUE: Multidetector CT imaging of the chest was performed using the standard protocol during bolus administration of intravenous contrast. Multiplanar CT image reconstructions and MIPs were obtained to evaluate the vascular anatomy. RADIATION DOSE REDUCTION: This exam was performed according to the departmental dose-optimization program which includes automated exposure control, adjustment of the mA and/or kV according to patient size and/or use of iterative reconstruction technique. CONTRAST:  75mL OMNIPAQUE IOHEXOL 350 MG/ML SOLN COMPARISON:  Chest CT 06/26/2023. FINDINGS: Cardiovascular: No filling defects within the pulmonary arterial tree to suggest pulmonary embolism. Heart size is mildly enlarged. There is no significant pericardial fluid, thickening or pericardial calcification. There is aortic atherosclerosis, as well as atherosclerosis of the great vessels of the mediastinum and the coronary arteries, including calcified atherosclerotic plaque in the left main, left anterior descending and left circumflex coronary arteries. Mediastinum/Nodes: No pathologically enlarged mediastinal or hilar lymph nodes. Esophagus is unremarkable in appearance. No axillary lymphadenopathy. Lungs/Pleura: 13 mm pleural-based nodule in the periphery of the right upper lobe (axial image 54 of series 5), similar on prior exams dating back to 10/07/2021, previously not hypermetabolic on prior PET-CT 11/30/2021, likely benign. Several patchy areas of ground-glass attenuation are again noted in the lungs bilaterally, randomly distributed, likely of infectious or inflammatory etiology. In addition,  there is complete atelectasis of the right lower lobe, as well as some passive subsegmental atelectasis in the lateral segment of the right middle lobe and dependent portions of the left lower lobe. Moderate to large right and small left pleural effusions lie dependently. Upper Abdomen: Liver has a shrunken appearance and nodular contour, indicative of underlying cirrhosis. Small to moderate volume of ascites in the upper abdomen. Musculoskeletal: There are no aggressive appearing lytic or blastic lesions noted in the visualized portions of the skeleton. Review of the MIP images confirms the above findings. IMPRESSION: 1. No evidence of pulmonary embolism. 2. Moderate to large right and small left pleural effusions persist with associated areas of atelectasis, as above. 3. Patchy areas of ground-glass attenuation scattered throughout the lungs bilaterally, presumably of infectious or inflammatory etiology. 4. Stable right upper lobe pulmonary nodule dating back to 10/07/2021, previously not hypermetabolic, considered benign. 5. Cirrhosis. 6. Ascites. 7. Aortic atherosclerosis, in addition to left main and 2 vessel coronary artery disease. Please note that although the presence of coronary artery calcium documents the presence of coronary artery disease, the severity of this disease and any potential stenosis cannot be assessed on this non-gated CT examination. Assessment for potential risk factor modification, dietary therapy or pharmacologic therapy may be warranted, if clinically indicated. Aortic Atherosclerosis (ICD10-I70.0). Electronically Signed   By: Trudie Reed M.D.   On: 06/27/2023 05:56   DG Chest Port 1 View  Result Date: 06/27/2023 CLINICAL DATA:  Chest EXAM: PORTABLE CHEST 1 VIEW COMPARISON:  06/24/2023 FINDINGS: Heart and mediastinal contours within normal limits. Worsening bilateral airspace disease, right greater than left. Moderate right pleural effusion. Small left pleural effusion. No  acute bony abnormality. IMPRESSION: Bilateral pleural effusions, right greater than left with worsening bilateral airspace disease, also greater than right. This could reflect asymmetric edema or infection. Electronically Signed   By: Charlett Nose M.D.   On: 06/27/2023 03:06   CT CHEST WO CONTRAST  Result Date: 06/26/2023 CLINICAL DATA:  Pneumonia. EXAM: CT CHEST WITHOUT CONTRAST TECHNIQUE: Multidetector CT imaging of the chest was performed following the standard protocol without IV contrast. RADIATION DOSE REDUCTION: This exam was performed according to the departmental dose-optimization program which  includes automated exposure control, adjustment of the mA and/or kV according to patient size and/or use of iterative reconstruction technique. COMPARISON:  08/18/2022 FINDINGS: Cardiovascular: Heart is mildly enlarged. No substantial pericardial effusion. Coronary artery calcification is evident. Mild atherosclerotic calcification is noted in the wall of the thoracic aorta. Enlargement of the pulmonary outflow tract/main pulmonary arteries suggests pulmonary arterial hypertension. Mediastinum/Nodes: Scattered upper normal mediastinal lymph nodes evident. No evidence for gross hilar lymphadenopathy although assessment is limited by the lack of intravenous contrast on the current study. Mild circumferential wall thickening noted distal esophagus. There is no axillary lymphadenopathy. Lungs/Pleura: Peripheral right upper lobe pulmonary nodule measured previously at 1.4 x 1.3 cm is now 1.5 x 1.2 cm on image 62/4. Bilateral lower lobe collapse/consolidation is new in the interval with patchy areas of ground-glass opacity in the left upper lobe. New moderate right and tiny left pleural effusions. Sub pulmonic component to the left pleural effusion. Upper Abdomen: Nodular liver contour is compatible with cirrhosis. Musculoskeletal: No worrisome lytic or sclerotic osseous abnormality. IMPRESSION: 1. Interval development  of bilateral lower lobe collapse/consolidation with patchy areas of ground-glass opacity in the left upper lobe. Imaging features compatible with multifocal pneumonia. 2. New moderate right and tiny left pleural effusions. 3. Similar peripheral right upper lobe pulmonary nodule. This lesion showed no appreciable hypermetabolism on PET-CT of 11/30/2021. 4. Cirrhosis. 5.  Aortic Atherosclerosis (ICD10-I70.0). Electronically Signed   By: Kennith Center M.D.   On: 06/26/2023 10:21    Scheduled Meds:  vitamin C  500 mg Oral Daily   Chlorhexidine Gluconate Cloth  6 each Topical Daily   fluticasone  1 spray Each Nare Daily   folic acid  1 mg Oral Daily   gemfibrozil  600 mg Oral BID AC   guaiFENesin  600 mg Oral BID   insulin aspart  0-5 Units Subcutaneous QHS   insulin aspart  0-9 Units Subcutaneous TID WC   insulin glargine-yfgn  7 Units Subcutaneous QHS   Interferon Beta-1b  0.25 mg Subcutaneous QODAY   iron polysaccharides  150 mg Oral Daily   loratadine  10 mg Oral Daily   mupirocin ointment  1 Application Nasal BID   nystatin   Topical TID   mouth rinse  15 mL Mouth Rinse 4 times per day   pantoprazole  40 mg Oral BID   pregabalin  200 mg Oral BID   sertraline  25 mg Oral Daily   simvastatin  40 mg Oral q1800   tamsulosin  0.4 mg Oral QPC supper   Vitamin D (Ergocalciferol)  50,000 Units Oral Q7 days   Continuous Infusions:  sodium chloride 10 mL/hr at 06/27/23 0700   ampicillin-sulbactam (UNASYN) IV 3 g (06/27/23 0828)   doxycycline (VIBRAMYCIN) IV Stopped (06/27/23 0525)   furosemide (LASIX) 200 mg in dextrose 5 % 100 mL (2 mg/mL) infusion 4 mg/hr (06/27/23 0700)   PRN Meds: sodium chloride, acetaminophen, albuterol, guaiFENesin-dextromethorphan, hydrALAZINE, ondansetron (ZOFRAN) IV, mouth rinse, traMADol  Time spent: 55 minutes  Author: Gillis Santa. MD [facility] 06/27/2023 8:37 AM  To reach On-call, see care teams to locate the attending and reach out to them via  [email or web address]. If 7PM-7AM, please contact night-coverage If you still have difficulty reaching the attending provider, please page the [facility] (Director on Call) for [facility] on amion for assistance.

## 2023-06-27 NOTE — Progress Notes (Signed)
PHARMACY CONSULT NOTE  Pharmacy Consult for Electrolyte Monitoring and Replacement   Recent Labs: Potassium (mmol/L)  Date Value  06/27/2023 3.4 (L)  08/31/2013 3.5   Magnesium (mg/dL)  Date Value  16/07/9603 1.9   Calcium (mg/dL)  Date Value  54/06/8118 8.1 (L)   Calcium, Total (mg/dL)  Date Value  14/78/2956 8.9   Albumin (g/dL)  Date Value  21/30/8657 2.7 (L)  02/15/2022 3.6 (L)  08/31/2013 2.9 (L)   Phosphorus (mg/dL)  Date Value  84/69/6295 2.5  08/28/2013 4.9   Sodium (mmol/L)  Date Value  06/27/2023 140  02/15/2022 138  08/31/2013 143     Assessment:  72 y.o. male admitted on 06/21/2023 with pneumonia. PMH significant for HTN, HLD, DM, chronic respiratory failure / COPD on 4L O2, diastolic CHF, GIB, depression, MS (wheel-chair bound), pulmonary HTN, chronic pain, liver cirrhosis, obesity, anemia. CT chest revealed features compatible with multifocal pneumonia.   Diuretics: furosemide infusion at 4 mg/hr  Goal of Therapy:  Electrolytes WNL  Plan:  ---40 mEq po KCL x 1 ---recheck electrolytes in am  Lowella Bandy ,PharmD Clinical Pharmacist 06/27/2023 6:52 AM

## 2023-06-27 NOTE — Progress Notes (Addendum)
EVENT CROSS COVER NOTE  NAME: Jeremy [name] MRN: [id number] DOB : May 01, 1951    Concern as stated by nurse / staff   Patient having 10 out of 10 chest pain, clutching his chest that awoke him from sleep.  EKG being obtained     Pertinent findings on chart review: Last progress note reviewed CT chest from 9/1 reviewed    Patient assessment Upon my arrival to the room, patient sitting upright on bed, wife at bedside.  States pain has resolved. HPI: Patient describes dull pain mostly to the back in the mid thoracic area radiating to his shoulders.  States he has never had a pain like that before.  He had no associated nausea or diaphoresis and no increase in his baseline shortness of breath Review of Systems  Cardiovascular:  Positive for chest pain. Negative for palpitations.       Shortness of breath at baseline      06/27/2023    3:15 AM 06/27/2023    3:00 AM 06/27/2023    2:38 AM  Vitals with BMI  Systolic  142   Diastolic  52   Pulse 77 81 81   Physical Exam Vitals and nursing note reviewed.  Constitutional:      General: He is not in acute distress.    Comments: Conversational dyspnea but otherwise appears comfortable  HENT:     Head: Normocephalic and atraumatic.  Cardiovascular:     Rate and Rhythm: Normal rate and regular rhythm.     Heart sounds: Normal heart sounds.  Pulmonary:     Effort: Pulmonary effort is normal.     Breath sounds: Normal breath sounds.     Comments: Diminished at bases Abdominal:     Palpations: Abdomen is soft.     Tenderness: There is no abdominal tenderness.  Neurological:     Mental Status: Mental status is at baseline.       WORKUP    CXR IMPRESSION: Bilateral pleural effusions, right greater than left with worsening bilateral airspace disease, also greater than right. This could reflect asymmetric edema or infection. Cardiac Panel (last 3 results) Recent Labs    06/27/23 0308  TROPONINIHS 60*   BNP 1328.4 (up from  465)  Trop 60(previously 20-21)  D-Dimer 7.57     Latest Ref Rng & Units 06/27/2023    3:08 AM 06/26/2023    4:51 PM 06/26/2023    5:16 AM  CBC  WBC 4.0 - 10.5 K/uL 7.2   6.3   Hemoglobin 13.0 - 17.0 g/dL 9.1  82.9  8.2   Hematocrit 39.0 - 52.0 % 29.1  32.5  24.5   Platelets 150 - 400 K/uL 272   266       Latest Ref Rng & Units 06/27/2023    3:08 AM 06/26/2023    5:16 AM 06/25/2023    5:39 AM  BMP  Glucose 70 - 99 mg/dL 562  83  130   BUN 8 - 23 mg/dL 46  52  57   Creatinine 0.61 - 1.24 mg/dL 8.65  7.84  6.96   Sodium 135 - 145 mmol/L 140  136  135   Potassium 3.5 - 5.1 mmol/L 3.4  3.1  3.3   Chloride 98 - 111 mmol/L 102  102  100   CO2 22 - 32 mmol/L 28  26  24    Calcium 8.9 - 10.3 mg/dL 8.1  7.8  7.8  Assessment and  Interventions   Assessment:  -- Acute Chest pain/mid thoracic back pain, (resolved)undetermined etiology, but suspecting related to pneumonia. Does not appear to be cardiac --D-dimer 7.57 though with baseline CKD and not more hypoxic or tachycardic   Plan: CTA chest if patient able to tolerate lying flat--negative for PE IMPRESSION: 1. No evidence of pulmonary embolism. 2. Moderate to large right and small left pleural effusions persist with associated areas of atelectasis, as above. 3. Patchy areas of ground-glass attenuation scattered throughout the lungs bilaterally, presumably of infectious or inflammatory etiology. Continue to monitor closely in stepdown X   Differential possibilities for cause of chest pain discussed with patient with wife at bedside.    CRITICAL CARE Performed by: Andris Baumann   Total critical care time: 90 minutes  Critical care time was exclusive of separately billable procedures and treating other patients.  Critical care was necessary to treat or prevent imminent or life-threatening deterioration.  Critical care was time spent personally by me on the following activities: development of treatment plan with patient  and/or surrogate as well as nursing, discussions with consultants, evaluation of patient's response to treatment, examination of patient, obtaining history from patient or surrogate, ordering and performing treatments and interventions, ordering and review of laboratory studies, ordering and review of radiographic studies, pulse oximetry and re-evaluation of patient's condition.

## 2023-06-27 NOTE — TOC Progression Note (Signed)
Transition of Care [facility]) - Progression Note    Patient Details  Name: Jeremy [name] MRN: [id number] Date of Birth: Nov 16, 1950  Transition of Care [facility]) CM/SW Contact  Truddie Hidden, RN Phone Number: 06/27/2023, 9:11 AM  Clinical Narrative:    TOC continuing to follow patent's progress throughout discharge planning.     Barriers to Discharge: Continued Medical Work up  Expected Discharge Plan and Services     Post Acute Care Choice: Home Health, Skilled Nursing Facility Living arrangements for the past 2 months: Single Family Home                                       Social Determinants of Health (SDOH) Interventions SDOH Screenings   Food Insecurity: No Food Insecurity (06/21/2023)  Housing: Low Risk  (06/21/2023)  Transportation Needs: No Transportation Needs (06/21/2023)  Utilities: Not At Risk (06/21/2023)  Alcohol Screen: Low Risk  (11/16/2021)  Depression (PHQ2-9): Low Risk  (05/27/2023)  Financial Resource Strain: Low Risk  (11/19/2022)  Physical Activity: Insufficiently Active (11/19/2022)  Social Connections: Moderately Isolated (11/19/2022)  Stress: No Stress Concern Present (11/19/2022)  Tobacco Use: Medium Risk (06/21/2023)    Readmission Risk Interventions    11/25/2022   10:25 AM 02/04/2022    9:47 AM  Readmission Risk Prevention Plan  Transportation Screening Complete Complete  PCP or Specialist Appt within 3-5 Days Complete Complete  HRI or Home Care Consult Complete   Social Work Consult for Recovery Care Planning/Counseling Complete Complete  Palliative Care Screening Not Applicable Not Applicable  Medication Review Oceanographer) Complete Complete

## 2023-06-27 NOTE — Plan of Care (Signed)
Problem: Education: Goal: Ability to describe self-care measures that may prevent or decrease complications (Diabetes Survival Skills Education) will improve 06/27/2023 2013 by Brooke Bonito, RN Outcome: Progressing 06/27/2023 2013 by Brooke Bonito, RN Outcome: Progressing Goal: Individualized Educational Video(s) 06/27/2023 2013 by Brooke Bonito, RN Outcome: Progressing 06/27/2023 2013 by Brooke Bonito, RN Outcome: Progressing   Problem: Coping: Goal: Ability to adjust to condition or change in health will improve 06/27/2023 2013 by Brooke Bonito, RN Outcome: Progressing 06/27/2023 2013 by Brooke Bonito, RN Outcome: Progressing   Problem: Fluid Volume: Goal: Ability to maintain a balanced intake and output will improve 06/27/2023 2013 by Brooke Bonito, RN Outcome: Progressing 06/27/2023 2013 by Brooke Bonito, RN Outcome: Progressing   Problem: Health Behavior/Discharge Planning: Goal: Ability to identify and utilize available resources and services will improve 06/27/2023 2013 by Brooke Bonito, RN Outcome: Progressing 06/27/2023 2013 by Brooke Bonito, RN Outcome: Progressing Goal: Ability to manage health-related needs will improve 06/27/2023 2013 by Brooke Bonito, RN Outcome: Progressing 06/27/2023 2013 by Brooke Bonito, RN Outcome: Progressing   Problem: Metabolic: Goal: Ability to maintain appropriate glucose levels will improve 06/27/2023 2013 by Brooke Bonito, RN Outcome: Progressing 06/27/2023 2013 by Brooke Bonito, RN Outcome: Progressing   Problem: Nutritional: Goal: Maintenance of adequate nutrition will improve 06/27/2023 2013 by Brooke Bonito, RN Outcome: Progressing 06/27/2023 2013 by Brooke Bonito, RN Outcome: Progressing Goal: Progress toward achieving an optimal weight will improve 06/27/2023 2013 by Brooke Bonito, RN Outcome: Progressing 06/27/2023 2013 by Brooke Bonito, RN Outcome: Progressing   Problem: Skin  Integrity: Goal: Risk for impaired skin integrity will decrease 06/27/2023 2013 by Brooke Bonito, RN Outcome: Progressing 06/27/2023 2013 by Brooke Bonito, RN Outcome: Progressing   Problem: Tissue Perfusion: Goal: Adequacy of tissue perfusion will improve 06/27/2023 2013 by Brooke Bonito, RN Outcome: Progressing 06/27/2023 2013 by Brooke Bonito, RN Outcome: Progressing   Problem: Education: Goal: Ability to demonstrate management of disease process will improve 06/27/2023 2013 by Brooke Bonito, RN Outcome: Progressing 06/27/2023 2013 by Brooke Bonito, RN Outcome: Progressing Goal: Ability to verbalize understanding of medication therapies will improve 06/27/2023 2013 by Brooke Bonito, RN Outcome: Progressing 06/27/2023 2013 by Brooke Bonito, RN Outcome: Progressing Goal: Individualized Educational Video(s) 06/27/2023 2013 by Brooke Bonito, RN Outcome: Progressing 06/27/2023 2013 by Brooke Bonito, RN Outcome: Progressing   Problem: Activity: Goal: Capacity to carry out activities will improve 06/27/2023 2013 by Brooke Bonito, RN Outcome: Progressing 06/27/2023 2013 by Brooke Bonito, RN Outcome: Progressing   Problem: Cardiac: Goal: Ability to achieve and maintain adequate cardiopulmonary perfusion will improve 06/27/2023 2013 by Brooke Bonito, RN Outcome: Progressing 06/27/2023 2013 by Brooke Bonito, RN Outcome: Progressing   Problem: Education: Goal: Knowledge of disease or condition will improve 06/27/2023 2013 by Brooke Bonito, RN Outcome: Progressing 06/27/2023 2013 by Brooke Bonito, RN Outcome: Progressing Goal: Knowledge of the prescribed therapeutic regimen will improve 06/27/2023 2013 by Brooke Bonito, RN Outcome: Progressing 06/27/2023 2013 by Brooke Bonito, RN Outcome: Progressing Goal: Individualized Educational Video(s) 06/27/2023 2013 by Brooke Bonito, RN Outcome: Progressing 06/27/2023 2013 by Brooke Bonito, RN Outcome:  Progressing   Problem: Activity: Goal: Ability to tolerate increased activity will improve 06/27/2023 2013 by Brooke Bonito, RN Outcome: Progressing 06/27/2023 2013 by Brooke Bonito, RN Outcome: Progressing Goal: Will verbalize the importance of  balancing activity with adequate rest periods 06/27/2023 2013 by Brooke Bonito, RN Outcome: Progressing 06/27/2023 2013 by Brooke Bonito, RN Outcome: Progressing   Problem: Respiratory: Goal: Ability to maintain a clear airway will improve 06/27/2023 2013 by Brooke Bonito, RN Outcome: Progressing 06/27/2023 2013 by Brooke Bonito, RN Outcome: Progressing Goal: Levels of oxygenation will improve 06/27/2023 2013 by Brooke Bonito, RN Outcome: Progressing 06/27/2023 2013 by Brooke Bonito, RN Outcome: Progressing Goal: Ability to maintain adequate ventilation will improve 06/27/2023 2013 by Brooke Bonito, RN Outcome: Progressing 06/27/2023 2013 by Brooke Bonito, RN Outcome: Progressing   Problem: Activity: Goal: Ability to tolerate increased activity will improve 06/27/2023 2013 by Brooke Bonito, RN Outcome: Progressing 06/27/2023 2013 by Brooke Bonito, RN Outcome: Progressing   Problem: Clinical Measurements: Goal: Ability to maintain a body temperature in the normal range will improve 06/27/2023 2013 by Brooke Bonito, RN Outcome: Progressing 06/27/2023 2013 by Brooke Bonito, RN Outcome: Progressing   Problem: Respiratory: Goal: Ability to maintain adequate ventilation will improve 06/27/2023 2013 by Brooke Bonito, RN Outcome: Progressing 06/27/2023 2013 by Brooke Bonito, RN Outcome: Progressing Goal: Ability to maintain a clear airway will improve 06/27/2023 2013 by Brooke Bonito, RN Outcome: Progressing 06/27/2023 2013 by Brooke Bonito, RN Outcome: Progressing   Problem: Education: Goal: Knowledge of General Education information will improve Description: Including pain rating scale, medication(s)/side  effects and non-pharmacologic comfort measures 06/27/2023 2013 by Brooke Bonito, RN Outcome: Progressing 06/27/2023 2013 by Brooke Bonito, RN Outcome: Progressing   Problem: Health Behavior/Discharge Planning: Goal: Ability to manage health-related needs will improve 06/27/2023 2013 by Brooke Bonito, RN Outcome: Progressing 06/27/2023 2013 by Brooke Bonito, RN Outcome: Progressing   Problem: Clinical Measurements: Goal: Ability to maintain clinical measurements within normal limits will improve 06/27/2023 2013 by Brooke Bonito, RN Outcome: Progressing 06/27/2023 2013 by Brooke Bonito, RN Outcome: Progressing Goal: Will remain free from infection 06/27/2023 2013 by Brooke Bonito, RN Outcome: Progressing 06/27/2023 2013 by Brooke Bonito, RN Outcome: Progressing Goal: Diagnostic test results will improve 06/27/2023 2013 by Brooke Bonito, RN Outcome: Progressing 06/27/2023 2013 by Brooke Bonito, RN Outcome: Progressing Goal: Respiratory complications will improve 06/27/2023 2013 by Brooke Bonito, RN Outcome: Progressing 06/27/2023 2013 by Brooke Bonito, RN Outcome: Progressing Goal: Cardiovascular complication will be avoided 06/27/2023 2013 by Brooke Bonito, RN Outcome: Progressing 06/27/2023 2013 by Brooke Bonito, RN Outcome: Progressing   Problem: Activity: Goal: Risk for activity intolerance will decrease 06/27/2023 2013 by Brooke Bonito, RN Outcome: Progressing 06/27/2023 2013 by Brooke Bonito, RN Outcome: Progressing   Problem: Nutrition: Goal: Adequate nutrition will be maintained 06/27/2023 2013 by Brooke Bonito, RN Outcome: Progressing 06/27/2023 2013 by Brooke Bonito, RN Outcome: Progressing   Problem: Coping: Goal: Level of anxiety will decrease 06/27/2023 2013 by Brooke Bonito, RN Outcome: Progressing 06/27/2023 2013 by Brooke Bonito, RN Outcome: Progressing   Problem: Elimination: Goal: Will not experience complications  related to bowel motility 06/27/2023 2013 by Brooke Bonito, RN Outcome: Progressing 06/27/2023 2013 by Brooke Bonito, RN Outcome: Progressing Goal: Will not experience complications related to urinary retention 06/27/2023 2013 by Brooke Bonito, RN Outcome: Progressing 06/27/2023 2013 by Brooke Bonito, RN Outcome: Progressing   Problem: Pain Managment: Goal: General experience of comfort will improve 06/27/2023 2013 by Brooke Bonito, RN Outcome: Progressing 06/27/2023 2013 by Antony Salmon,  Gala Romney, RN Outcome: Progressing   Problem: Safety: Goal: Ability to remain free from injury will improve 06/27/2023 2013 by Brooke Bonito, RN Outcome: Progressing 06/27/2023 2013 by Brooke Bonito, RN Outcome: Progressing   Problem: Skin Integrity: Goal: Risk for impaired skin integrity will decrease Outcome: Progressing   Problem: Education: Goal: Ability to demonstrate management of disease process will improve 06/27/2023 2013 by Brooke Bonito, RN Outcome: Progressing 06/27/2023 2013 by Brooke Bonito, RN Outcome: Progressing Goal: Ability to verbalize understanding of medication therapies will improve 06/27/2023 2013 by Brooke Bonito, RN Outcome: Progressing 06/27/2023 2013 by Brooke Bonito, RN Outcome: Progressing Goal: Individualized Educational Video(s) 06/27/2023 2013 by Brooke Bonito, RN Outcome: Progressing 06/27/2023 2013 by Brooke Bonito, RN Outcome: Progressing   Problem: Activity: Goal: Capacity to carry out activities will improve 06/27/2023 2013 by Brooke Bonito, RN Outcome: Progressing 06/27/2023 2013 by Brooke Bonito, RN Outcome: Progressing   Problem: Cardiac: Goal: Ability to achieve and maintain adequate cardiopulmonary perfusion will improve 06/27/2023 2013 by Brooke Bonito, RN Outcome: Progressing 06/27/2023 2013 by Brooke Bonito, RN Outcome: Progressing

## 2023-06-27 NOTE — Progress Notes (Signed)
[facility]  ROUNDING NOTE   Subjective:   Jeremy [name] is a 72 year old male with past medical conditions including hyperlipidemia, diabetes, hypertension, respiratory failure, COPD with 4 L nasal cannula at baseline, depression, diastolic heart failure, multiple sclerosis, liver cirrhosis, anemia, pulmonary hypertension, and chronic kidney disease stage IIIa.  Patient presents to the emergency department complaining of shortness of breath and has been admitted for Acute on chronic respiratory failure with hypoxia [facility]) [J96.21]  Patient is known to our practice from outpatient follow-up.  Patient was last seen in office on 07/01/2022 for routine follow-up.    Patient was transferred to ICU overnight due to concern of chest pain. Remains on 4 L Generalized edema slowly improving Creatinine slowly improving. Urine output 4.0 L in 24 hours.  IV furosemide drip at 4 mg/h He underwent CT angiogram to rule out PE and was started on IV Unasyn and IV doxycycline.  Objective:  Vital signs in last 24 hours:  Temp:  [97.3 F (36.3 C)-98.8 F (37.1 C)] 98.3 F (36.8 C) (09/02 0800) Pulse Rate:  [35-133] 35 (09/02 1000) Resp:  [14-29] 20 (09/02 1000) BP: (110-184)/(41-87) 128/45 (09/02 1000) SpO2:  [83 %-99 %] 97 % (09/02 1000) Weight:  [96.5 kg] 96.5 kg (09/02 0520)  Weight change: -8.7 kg Filed Weights   06/25/23 0500 06/26/23 0500 06/27/23 0520  Weight: 105 kg 105.2 kg 96.5 kg    Intake/Output: I/O last 3 completed shifts: In: 2411.7 [P.O.:840; I.V.:190.8; IV Piggyback:1380.9] Out: 6340 [Urine:6340]   Intake/Output this shift:  Total I/O In: 371 [P.O.:240; I.V.:31; IV Piggyback:100] Out: -   Physical Exam: General: NAD  Head: Normocephalic, atraumatic. Moist oral mucosal membranes  Eyes: Anicteric  Lungs:  Basilar wheeze, 4 L [place]  Heart: Regular rate and rhythm  Abdomen:  Soft, nontender, nondistended  Extremities: 2+ dependent peripheral edema.  Neurologic:  Alert and oriented, moving all four extremities  Skin: No lesions  GU Foley catheter    Basic Metabolic Panel: Recent Labs  Lab 06/23/23 0531 06/23/23 1624 06/24/23 0432 06/25/23 0539 06/26/23 0516 06/27/23 0308  NA 125* 128* 132* 135 136 140  K 4.3 4.1 3.9 3.3* 3.1* 3.4*  CL 90* 93* 94* 100 102 102  CO2 26 23 25 24 26 28   GLUCOSE 148* 192* 104* 115* 83 111*  BUN 48* 54* 57* 57* 52* 46*  CREATININE 1.44* 1.42* 1.53* 1.44* 1.26* 1.25*  CALCIUM 7.9* 8.0* 8.9 7.8* 7.8* 8.1*  MG 2.0  --  2.0 1.9 1.6* 1.9  PHOS 2.8  --  2.9 2.7 2.9 2.5    Liver Function Tests: Recent Labs  Lab 06/21/23 1504 06/24/23 0432 06/25/23 0539 06/26/23 0516 06/27/23 0308  AST 27 21 27 18 21   ALT 10 12 12 12 13   ALKPHOS 95 61 57 58 66  BILITOT 0.9 0.8 0.9 1.0 1.3*  PROT 6.6 5.5* 5.1* 5.1* 6.2*  ALBUMIN 2.1* 1.9* 2.1* 2.3* 2.7*   No results for input(s): "LIPASE", "AMYLASE" in the last 168 hours. Recent Labs  Lab 06/21/23 2019  AMMONIA 26    CBC: Recent Labs  Lab 06/20/23 1330 06/21/23 1504 06/22/23 0619 06/23/23 0531 06/23/23 1624 06/24/23 0432 06/24/23 1636 06/25/23 0539 06/25/23 1334 06/26/23 0516 06/26/23 1651 06/27/23 0308  WBC 8.5 9.1   < > 5.8  --  7.1  --  7.2  --  6.3  --  7.2  NEUTROABS 7.0 7.1  --   --   --   --   --   --   --   --   --   --  HGB 8.7* 8.8*   < > 6.3*   < > 6.9*   < > 8.0* 8.9* 8.2* 10.4* 9.1*  HCT 26.8* 26.7*   < > 19.0*   < > 20.5*   < > 24.0* 27.4* 24.5* 32.5* 29.1*  MCV 86.7 86.1   < > 84.4  --  83.3  --  83.9  --  84.2  --  87.4  PLT 286 326   < > 278  --  304  --  283  --  266  --  272   < > = values in this interval not displayed.    Cardiac Enzymes: No results for input(s): "CKTOTAL", "CKMB", "CKMBINDEX", "TROPONINI" in the last 168 hours.  BNP: Invalid input(s): "POCBNP"  CBG: Recent Labs  Lab 06/26/23 0807 06/26/23 1139 06/26/23 1613 06/26/23 2102 06/27/23 0726  GLUCAP 93 135* 203* 173* 70    Microbiology: Results for  orders placed or performed during the hospital encounter of 06/21/23  Culture, blood (Routine x 2)     Status: None   Collection Time: 06/21/23  3:02 PM   Specimen: BLOOD  Result Value Ref Range Status   Specimen Description BLOOD LEFT ANTECUBITAL  Final   Special Requests   Final    BOTTLES DRAWN AEROBIC AND ANAEROBIC Blood Culture adequate volume   Culture   Final    NO GROWTH 5 DAYS Performed at [facility], [address], [place], [place] [address]    Report Status 06/26/2023 FINAL  Final  Culture, blood (Routine x 2)     Status: None   Collection Time: 06/21/23  3:13 PM   Specimen: BLOOD  Result Value Ref Range Status   Specimen Description BLOOD RIGHT ANTECUBITAL  Final   Special Requests   Final    BOTTLES DRAWN AEROBIC AND ANAEROBIC Blood Culture adequate volume   Culture   Final    NO GROWTH 5 DAYS Performed at [facility], [address] [place], [place] [address]    Report Status 06/26/2023 FINAL  Final  Resp panel by RT-PCR (RSV, Flu A&B, Covid) Anterior Nasal Swab     Status: None   Collection Time: 06/21/23  4:35 PM   Specimen: Anterior Nasal Swab  Result Value Ref Range Status   SARS Coronavirus 2 by RT PCR NEGATIVE NEGATIVE Final    Comment: (NOTE) SARS-CoV-2 target nucleic acids are NOT DETECTED.  The SARS-CoV-2 RNA is generally detectable in upper respiratory specimens during the acute phase of infection. The lowest concentration of SARS-CoV-2 viral copies this assay can detect is 138 copies/mL. A negative result does not preclude SARS-Cov-2 infection and should not be used as the sole basis for treatment or other patient management decisions. A negative result may occur with  improper specimen collection/handling, submission of specimen other than nasopharyngeal swab, presence of viral mutation(s) within the areas targeted by this assay, and inadequate number of viral copies(<138 copies/mL). A negative result must be combined  with clinical observations, patient history, and epidemiological information. The expected result is Negative.  Fact Sheet for Patients:  BloggerCourse.com  Fact Sheet for Healthcare Providers:  SeriousBroker.it  This test is no t yet approved or cleared by the [place] FDA and  has been authorized for detection and/or diagnosis of SARS-CoV-2 by FDA under an Emergency Use Authorization (EUA). This EUA will remain  in effect (meaning this test can be used) for the duration of the COVID-19 declaration under Section 564(b)(1) of the Act, 21  U.S.C.section 360bbb-3(b)(1), unless the authorization is terminated  or revoked sooner.       Influenza A by PCR NEGATIVE NEGATIVE Final   Influenza B by PCR NEGATIVE NEGATIVE Final    Comment: (NOTE) The Xpert Xpress SARS-CoV-2/FLU/RSV plus assay is intended as an aid in the diagnosis of influenza from Nasopharyngeal swab specimens and should not be used as a sole basis for treatment. Nasal washings and aspirates are unacceptable for Xpert Xpress SARS-CoV-2/FLU/RSV testing.  Fact Sheet for Patients: BloggerCourse.com  Fact Sheet for Healthcare Providers: SeriousBroker.it  This test is not yet approved or cleared by the [place] FDA and has been authorized for detection and/or diagnosis of SARS-CoV-2 by FDA under an Emergency Use Authorization (EUA). This EUA will remain in effect (meaning this test can be used) for the duration of the COVID-19 declaration under Section 564(b)(1) of the Act, 21 U.S.C. section 360bbb-3(b)(1), unless the authorization is terminated or revoked.     Resp Syncytial Virus by PCR NEGATIVE NEGATIVE Final    Comment: (NOTE) Fact Sheet for Patients: BloggerCourse.com  Fact Sheet for Healthcare Providers: SeriousBroker.it  This test is not yet approved  or cleared by the [place] FDA and has been authorized for detection and/or diagnosis of SARS-CoV-2 by FDA under an Emergency Use Authorization (EUA). This EUA will remain in effect (meaning this test can be used) for the duration of the COVID-19 declaration under Section 564(b)(1) of the Act, 21 U.S.C. section 360bbb-3(b)(1), unless the authorization is terminated or revoked.  Performed at [facility], [address] [place], [place] [address]   MRSA Next Gen by PCR, Nasal     Status: Abnormal   Collection Time: 06/21/23  8:14 PM   Specimen: Nasal Mucosa; Nasal Swab  Result Value Ref Range Status   MRSA by PCR Next Gen DETECTED (A) NOT DETECTED Final    Comment: RESULT CALLED TO, READ BACK BY AND VERIFIED WITH: KRISTY SNEEAD AT 2206 ON 06/21/23 BY SS (NOTE) The GeneXpert MRSA Assay (FDA approved for NASAL specimens only), is one component of a comprehensive MRSA colonization surveillance program. It is not intended to diagnose MRSA infection nor to guide or monitor treatment for MRSA infections. Test performance is not FDA approved in patients less than 26 years old. Performed at [facility], [address] [place], [place] [address]   Respiratory (~20 pathogens) panel by PCR     Status: None   Collection Time: 06/22/23  3:22 PM   Specimen: Nasopharyngeal Swab; Respiratory  Result Value Ref Range Status   Adenovirus NOT DETECTED NOT DETECTED Final   Coronavirus 229E NOT DETECTED NOT DETECTED Final    Comment: (NOTE) The Coronavirus on the Respiratory Panel, DOES NOT test for the novel  Coronavirus (2019 nCoV)    Coronavirus HKU1 NOT DETECTED NOT DETECTED Final   Coronavirus NL63 NOT DETECTED NOT DETECTED Final   Coronavirus OC43 NOT DETECTED NOT DETECTED Final   Metapneumovirus NOT DETECTED NOT DETECTED Final   Rhinovirus / Enterovirus NOT DETECTED NOT DETECTED Final   Influenza A NOT DETECTED NOT DETECTED Final   Influenza B NOT DETECTED NOT  DETECTED Final   Parainfluenza Virus 1 NOT DETECTED NOT DETECTED Final   Parainfluenza Virus 2 NOT DETECTED NOT DETECTED Final   Parainfluenza Virus 3 NOT DETECTED NOT DETECTED Final   Parainfluenza Virus 4 NOT DETECTED NOT DETECTED Final   Respiratory Syncytial Virus NOT DETECTED NOT DETECTED Final   Bordetella pertussis NOT DETECTED NOT DETECTED Final  Bordetella Parapertussis NOT DETECTED NOT DETECTED Final   Chlamydophila pneumoniae NOT DETECTED NOT DETECTED Final   Mycoplasma pneumoniae NOT DETECTED NOT DETECTED Final    Comment: Performed at [facility], [address] [place], [place] [address]  Body fluid culture w Gram Stain     Status: None   Collection Time: 06/24/23 12:12 PM   Specimen: PATH Cytology Peritoneal fluid  Result Value Ref Range Status   Specimen Description   Final    PERITONEAL Performed at [facility], [address], [place], [place] [address]    Special Requests   Final    NONE Performed at [facility], [address] [place], [place] [address]    Gram Stain NO WBC SEEN NO ORGANISMS SEEN   Final   Culture   Final    NO GROWTH 3 DAYS Performed at [facility], [address] [place], [place] [address]    Report Status 06/27/2023 FINAL  Final  MRSA Next Gen by PCR, Nasal     Status: Abnormal   Collection Time: 06/26/23  5:43 PM   Specimen: Nasal Mucosa; Nasal Swab  Result Value Ref Range Status   MRSA by PCR Next Gen DETECTED (A) NOT DETECTED Final    Comment: RESULT CALLED TO, READ BACK BY AND VERIFIED WITH: [facility] WHITE 06/26/2023 2055 CP (NOTE) The GeneXpert MRSA Assay (FDA approved for NASAL specimens only), is one component of a comprehensive MRSA colonization surveillance program. It is not intended to diagnose MRSA infection nor to guide or monitor treatment for MRSA infections. Test performance is not FDA approved in patients less than 74 years old. Performed at [facility], [address] [place], [place] [address]     Coagulation Studies: Recent Labs    06/27/23 0308  LABPROT 19.5*  INR 1.6*    Urinalysis: No results for input(s): "COLORURINE", "LABSPEC", "PHURINE", "GLUCOSEU", "HGBUR", "BILIRUBINUR", "KETONESUR", "PROTEINUR", "UROBILINOGEN", "NITRITE", "LEUKOCYTESUR" in the last 72 hours.  Invalid input(s): "APPERANCEUR"    Imaging: CT Angio Chest Pulmonary Embolism (PE) W or WO Contrast  Result Date: 06/27/2023 CLINICAL DATA:  72 year old male with history of positive D-dimer. Low to intermediate probability of pulmonary embolism. EXAM: CT ANGIOGRAPHY CHEST WITH CONTRAST TECHNIQUE: Multidetector CT imaging of the chest was performed using the standard protocol during bolus administration of intravenous contrast. Multiplanar CT image reconstructions and MIPs were obtained to evaluate the vascular anatomy. RADIATION DOSE REDUCTION: This exam was performed according to the departmental dose-optimization program which includes automated exposure control, adjustment of the mA and/or kV according to patient size and/or use of iterative reconstruction technique. CONTRAST:  75mL OMNIPAQUE IOHEXOL 350 MG/ML SOLN COMPARISON:  Chest CT 06/26/2023. FINDINGS: Cardiovascular: No filling defects within the pulmonary arterial tree to suggest pulmonary embolism. Heart size is mildly enlarged. There is no significant pericardial fluid, thickening or pericardial calcification. There is aortic atherosclerosis, as well as atherosclerosis of the great vessels of the mediastinum and the coronary arteries, including calcified atherosclerotic plaque in the left main, left anterior descending and left circumflex coronary arteries. Mediastinum/Nodes: No pathologically enlarged mediastinal or hilar lymph nodes. Esophagus is unremarkable in appearance. No axillary lymphadenopathy. Lungs/Pleura: 13 mm pleural-based nodule in the periphery of the right upper lobe (axial image 54 of series 5), similar on  prior exams dating back to 10/07/2021, previously not hypermetabolic on prior PET-CT 11/30/2021, likely benign. Several patchy areas of ground-glass attenuation are again noted in the lungs bilaterally, randomly distributed, likely  of infectious or inflammatory etiology. In addition, there is complete atelectasis of the right lower lobe, as well as some passive subsegmental atelectasis in the lateral segment of the right middle lobe and dependent portions of the left lower lobe. Moderate to large right and small left pleural effusions lie dependently. Upper Abdomen: Liver has a shrunken appearance and nodular contour, indicative of underlying cirrhosis. Small to moderate volume of ascites in the upper abdomen. Musculoskeletal: There are no aggressive appearing lytic or blastic lesions noted in the visualized portions of the skeleton. Review of the MIP images confirms the above findings. IMPRESSION: 1. No evidence of pulmonary embolism. 2. Moderate to large right and small left pleural effusions persist with associated areas of atelectasis, as above. 3. Patchy areas of ground-glass attenuation scattered throughout the lungs bilaterally, presumably of infectious or inflammatory etiology. 4. Stable right upper lobe pulmonary nodule dating back to 10/07/2021, previously not hypermetabolic, considered benign. 5. Cirrhosis. 6. Ascites. 7. Aortic atherosclerosis, in addition to left main and 2 vessel coronary artery disease. Please note that although the presence of coronary artery calcium documents the presence of coronary artery disease, the severity of this disease and any potential stenosis cannot be assessed on this non-gated CT examination. Assessment for potential risk factor modification, dietary therapy or pharmacologic therapy may be warranted, if clinically indicated. Aortic Atherosclerosis (ICD10-I70.0). Electronically Signed   By: Trudie Reed M.D.   On: 06/27/2023 05:56   DG Chest Port 1 View  Result  Date: 06/27/2023 CLINICAL DATA:  Chest EXAM: PORTABLE CHEST 1 VIEW COMPARISON:  06/24/2023 FINDINGS: Heart and mediastinal contours within normal limits. Worsening bilateral airspace disease, right greater than left. Moderate right pleural effusion. Small left pleural effusion. No acute bony abnormality. IMPRESSION: Bilateral pleural effusions, right greater than left with worsening bilateral airspace disease, also greater than right. This could reflect asymmetric edema or infection. Electronically Signed   By: Charlett Nose M.D.   On: 06/27/2023 03:06   CT CHEST WO CONTRAST  Result Date: 06/26/2023 CLINICAL DATA:  Pneumonia. EXAM: CT CHEST WITHOUT CONTRAST TECHNIQUE: Multidetector CT imaging of the chest was performed following the standard protocol without IV contrast. RADIATION DOSE REDUCTION: This exam was performed according to the departmental dose-optimization program which includes automated exposure control, adjustment of the mA and/or kV according to patient size and/or use of iterative reconstruction technique. COMPARISON:  08/18/2022 FINDINGS: Cardiovascular: Heart is mildly enlarged. No substantial pericardial effusion. Coronary artery calcification is evident. Mild atherosclerotic calcification is noted in the wall of the thoracic aorta. Enlargement of the pulmonary outflow tract/main pulmonary arteries suggests pulmonary arterial hypertension. Mediastinum/Nodes: Scattered upper normal mediastinal lymph nodes evident. No evidence for gross hilar lymphadenopathy although assessment is limited by the lack of intravenous contrast on the current study. Mild circumferential wall thickening noted distal esophagus. There is no axillary lymphadenopathy. Lungs/Pleura: Peripheral right upper lobe pulmonary nodule measured previously at 1.4 x 1.3 cm is now 1.5 x 1.2 cm on image 62/4. Bilateral lower lobe collapse/consolidation is new in the interval with patchy areas of ground-glass opacity in the left upper  lobe. New moderate right and tiny left pleural effusions. Sub pulmonic component to the left pleural effusion. Upper Abdomen: Nodular liver contour is compatible with cirrhosis. Musculoskeletal: No worrisome lytic or sclerotic osseous abnormality. IMPRESSION: 1. Interval development of bilateral lower lobe collapse/consolidation with patchy areas of ground-glass opacity in the left upper lobe. Imaging features compatible with multifocal pneumonia. 2. New moderate right and tiny left pleural effusions. 3.  Similar peripheral right upper lobe pulmonary nodule. This lesion showed no appreciable hypermetabolism on PET-CT of 11/30/2021. 4. Cirrhosis. 5.  Aortic Atherosclerosis (ICD10-I70.0). Electronically Signed   By: Kennith Center M.D.   On: 06/26/2023 10:21     Medications:    sodium chloride 10 mL/hr at 06/27/23 1000   ampicillin-sulbactam (UNASYN) IV Stopped (06/27/23 5809)   doxycycline (VIBRAMYCIN) IV Stopped (06/27/23 0525)   furosemide (LASIX) 200 mg in dextrose 5 % 100 mL (2 mg/mL) infusion 4 mg/hr (06/27/23 1000)    vitamin C  500 mg Oral Daily   Chlorhexidine Gluconate Cloth  6 each Topical Daily   fluticasone  1 spray Each Nare Daily   folic acid  1 mg Oral Daily   gemfibrozil  600 mg Oral BID AC   guaiFENesin  600 mg Oral BID   insulin aspart  0-5 Units Subcutaneous QHS   insulin aspart  0-9 Units Subcutaneous TID WC   insulin glargine-yfgn  7 Units Subcutaneous QHS   Interferon Beta-1b  0.25 mg Subcutaneous QODAY   iron polysaccharides  150 mg Oral Daily   loratadine  10 mg Oral Daily   mupirocin ointment  1 Application Nasal BID   nystatin   Topical TID   mouth rinse  15 mL Mouth Rinse 4 times per day   pantoprazole  40 mg Oral BID   pregabalin  200 mg Oral BID   sertraline  25 mg Oral Daily   simvastatin  40 mg Oral q1800   tamsulosin  0.4 mg Oral QPC supper   Vitamin D (Ergocalciferol)  50,000 Units Oral Q7 days   sodium chloride, acetaminophen, albuterol,  guaiFENesin-dextromethorphan, hydrALAZINE, ondansetron (ZOFRAN) IV, mouth rinse, traMADol  Assessment/ Plan:  [name] is a 72 y.o.  male with past medical conditions including hyperlipidemia, diabetes, hypertension, respiratory failure, COPD with 4 L nasal cannula at baseline, depression, diastolic heart failure, multiple sclerosis, liver cirrhosis, anemia, pulmonary hypertension, and chronic kidney disease stage IIIa.  Patient presents to the emergency department complaining of shortness of breath and has been admitted for Acute on chronic respiratory failure with hypoxia (HCC) [J96.21]   Chronic kidney disease stage IIIa, patient currently at baseline renal function. Chronic kidney disease is secondary to diabetes, hypertension, advanced age Creatinine remained stable with adequate response to diuretic therapy.  Continue diuresis at prescribed rate. Patient underwent CT angiogram yesterday.  Will monitor the effect of IV contrast exposure on renal function.  Lab Results  Component Value Date   CREATININE 1.25 (H) 06/27/2023   CREATININE 1.26 (H) 06/26/2023   CREATININE 1.44 (H) 06/25/2023    Intake/Output Summary (Last 24 hours) at 06/27/2023 1123 Last data filed at 06/27/2023 1000 Gross per 24 hour  Intake 2782.65 ml  Output 3740 ml  Net -957.35 ml   2.  Volume overload, appears multifactorial with contributing factors of COPD   .  CT angiogram from 06/26/2023-large right pleural effusion. -Continues to have dependent edema over thighs.  Continued on IV furosemide at 4 mg/h with good response. Consider low-dose IV albumin supplementation.  3. Anemia of chronic kidney disease Lab Results  Component Value Date   HGB 9.1 (L) 06/27/2023    Hemoglobin improved with 1 unit blood transfusion this admission.  4.  Diabetes mellitus type II with chronic kidney disease/renal manifestations: insulin dependent. Home regimen includes Lantus. Most recent hemoglobin A1c is 4.4 on 11/24/22.      LOS: 6 Collen Vincent 9/2/202411:23 AM

## 2023-06-27 NOTE — Plan of Care (Signed)
PMT following. Patient moved to ICU bed. Currently he is resting in bed actively talking and laughing with 2 visitors at bedside. PMT will follow up again tomorrow.

## 2023-06-28 ENCOUNTER — Inpatient Hospital Stay: Payer: Medicare Other

## 2023-06-28 ENCOUNTER — Inpatient Hospital Stay (HOSPITAL_COMMUNITY)
Admit: 2023-06-28 | Discharge: 2023-06-28 | Disposition: A | Discharge status: Home / Self Care | Payer: Medicare Other | Attending: Student | Admitting: Student

## 2023-06-28 DIAGNOSIS — I5031 Acute diastolic (congestive) heart failure: Secondary | ICD-10-CM

## 2023-06-28 DIAGNOSIS — J9621 Acute and chronic respiratory failure with hypoxia: Secondary | ICD-10-CM | POA: Diagnosis not present

## 2023-06-28 DIAGNOSIS — Z7189 Other specified counseling: Secondary | ICD-10-CM | POA: Diagnosis not present

## 2023-06-28 LAB — ECHOCARDIOGRAM COMPLETE
AR max vel: 2.18 cm2
AV Area VTI: 2.26 cm2
AV Area mean vel: 1.94 cm2
AV Mean grad: 4 mmHg
AV Peak grad: 7.3 mmHg
Ao pk vel: 1.35 m/s
Area-P 1/2: 3.77 cm2
Height: 70 in
S' Lateral: 3.6 cm
Weight: 3421.54 [oz_av]

## 2023-06-28 LAB — GLUCOSE, CAPILLARY
Glucose-Capillary: 106 mg/dL — ABNORMAL HIGH (ref 70–99)
Glucose-Capillary: 154 mg/dL — ABNORMAL HIGH (ref 70–99)
Glucose-Capillary: 131 mg/dL — ABNORMAL HIGH (ref 70–99)

## 2023-06-28 LAB — CBC
HCT: 26.6 % — ABNORMAL LOW (ref 39.0–52.0)
Hemoglobin: 8.5 g/dL — ABNORMAL LOW (ref 13.0–17.0)
MCH: 28.1 pg (ref 26.0–34.0)
MCHC: 32 g/dL (ref 30.0–36.0)
MCV: 88.1 fL (ref 80.0–100.0)
Platelets: 238 10*3/uL (ref 150–400)
RBC: 3.02 MIL/uL — ABNORMAL LOW (ref 4.22–5.81)
RDW: 17.7 % — ABNORMAL HIGH (ref 11.5–15.5)
WBC: 6.2 10*3/uL (ref 4.0–10.5)
nRBC: 0 % (ref 0.0–0.2)

## 2023-06-28 LAB — BODY FLUID CELL COUNT WITH DIFFERENTIAL
Eos, Fluid: 0 %
Lymphs, Fluid: 28 %
Monocyte-Macrophage-Serous Fluid: 9 %
Neutrophil Count, Fluid: 62 %
Other Cells, Fluid: 1 %
Total Nucleated Cell Count, Fluid: 1309 uL

## 2023-06-28 LAB — PATHOLOGIST SMEAR REVIEW

## 2023-06-28 LAB — PROTEIN, PLEURAL OR PERITONEAL FLUID: Total protein, fluid: <3 g/dL

## 2023-06-28 LAB — MAGNESIUM: Magnesium: 1.9 mg/dL (ref 1.7–2.4)

## 2023-06-28 LAB — HEMOGLOBIN AND HEMATOCRIT, BLOOD
HCT: 30.5 % — ABNORMAL LOW (ref 39.0–52.0)
Hemoglobin: 9.6 g/dL — ABNORMAL LOW (ref 13.0–17.0)

## 2023-06-28 LAB — BASIC METABOLIC PANEL
Anion gap: 8 (ref 5–15)
BUN: 41 mg/dL — ABNORMAL HIGH (ref 8–23)
CO2: 30 mmol/L (ref 22–32)
Calcium: 8.1 mg/dL — ABNORMAL LOW (ref 8.9–10.3)
Chloride: 106 mmol/L (ref 98–111)
Creatinine, Ser: 1.16 mg/dL (ref 0.61–1.24)
GFR, Estimated: >60 mL/min (ref >60)
Glucose, Bld: 94 mg/dL (ref 70–99)
Potassium: 3.2 mmol/L — ABNORMAL LOW (ref 3.5–5.1)
Sodium: 144 mmol/L (ref 135–145)

## 2023-06-28 LAB — LACTATE DEHYDROGENASE, PLEURAL OR PERITONEAL FLUID: LD, Fluid: 179 U/L — ABNORMAL HIGH (ref 3–23)

## 2023-06-28 LAB — PHOSPHORUS: Phosphorus: 3.2 mg/dL (ref 2.5–4.6)

## 2023-06-28 MED ORDER — POTASSIUM CHLORIDE CRYS ER 20 MEQ PO TBCR
40.0000 meq | EXTENDED_RELEASE_TABLET | Freq: Three times a day (TID) | ORAL | Status: AC
  Administered 2023-06-28 (×2): 40 meq via ORAL
  Filled 2023-06-28 (×2): qty 2

## 2023-06-28 NOTE — Progress Notes (Signed)
PT Cancellation Note  Patient Details Name: Jeremy [name] MRN: [id number] DOB: [birth year]   Cancelled Treatment:    Reason Eval/Treat Not Completed: Other (comment). Pt with change in status- transfer back to CCU on 9/1. Currently on HFNC. Please re-order therapy services when pt able to participate. Sent message to care team.   Adrionna Delcid 06/28/2023, 9:51 AM Elizabeth Palau, PT, DPT, GCS [phone]

## 2023-06-28 NOTE — TOC Progression Note (Signed)
Transition of Care [facility]) - Progression Note    Patient Details  Name: Jeremy [name] MRN: [id number] Date of Birth: 1951/03/05  [facility] CM/SW Contact  Kreg Shropshire, RN Phone Number: 06/28/2023, 11:25 AM  Clinical Narrative:    Cm continue to follow pt for toc needs. LTAC recommendations were put in for pt. Cm talked with wife and pt about LTAC. Pt wife wanted to speak with doctor first and get more information regarding LTAC. Cm continue to follow for toc needs.     Barriers to Discharge: Continued Medical Work up  Expected Discharge Plan and Services     Post Acute Care Choice: Home Health, Skilled Nursing Facility Living arrangements for the past 2 months: Single Family Home                                       Social Determinants of Health (SDOH) Interventions SDOH Screenings   Food Insecurity: No Food Insecurity (06/21/2023)  Housing: Low Risk  (06/21/2023)  Transportation Needs: No Transportation Needs (06/21/2023)  Utilities: Not At Risk (06/21/2023)  Alcohol Screen: Low Risk  (11/16/2021)  Depression (PHQ2-9): Low Risk  (05/27/2023)  Financial Resource Strain: Low Risk  (11/19/2022)  Physical Activity: Insufficiently Active (11/19/2022)  Social Connections: Moderately Isolated (11/19/2022)  Stress: No Stress Concern Present (11/19/2022)  Tobacco Use: Medium Risk (06/21/2023)    Readmission Risk Interventions    11/25/2022   10:25 AM 02/04/2022    9:47 AM  Readmission Risk Prevention Plan  Transportation Screening Complete Complete  PCP or Specialist Appt within 3-5 Days Complete Complete  HRI or Home Care Consult Complete   Social Work Consult for Recovery Care Planning/Counseling Complete Complete  Palliative Care Screening Not Applicable Not Applicable  Medication Review Oceanographer) Complete Complete

## 2023-06-28 NOTE — Evaluation (Signed)
Physical Therapy Evaluation Patient Details Name: Jeremy [name] MRN: [id number] DOB: 08-18-1951 Today's Date: 06/28/2023  History of Present Illness  Pt is a 72 year old male presenting to ED with SOB, admitted with acute on chronic respiratory failure due to CHF and COPD exacerbation, pleural effusion     PMH significant for HTN, HLD, DM, chronic respiratory failure, COPD on 4L O2, dCHF, GIB, depression, MS (wheelchair-bound), pulmonary hypertension, chronic pain, liver cirrhosis, obesity, anemia.  Clinical Impression   Pt is seen by OT and PT for a co-reevaluation. Pt is received in bed with wife at bedside, he is agreeable to PT session. Vitals assessed throughout session with sats ranging from 88-93% with mobility with one episode of noted desat to 68% but able to recover shortly-RN notified and came to bedside. Pt performs bed mobility and transfers max A x2. Pt requires multi cuing to achieve tasks throughout session but able to demonstrate fair seated balance at EOB. Pt demonstrates decreased activity tolerance secondary to increase fatigue and pneumonia. Pt would benefit from skilled PT to address above deficits and promote optimal return to PLOF.          If plan is discharge home, recommend the following: Two people to help with walking and/or transfers;Two people to help with bathing/dressing/bathroom;Assist for transportation;Assistance with feeding;Help with stairs or ramp for entrance   Can travel by private vehicle   No    Equipment Recommendations None recommended by PT  Recommendations for Other Services       Functional Status Assessment Patient has had a recent decline in their functional status and demonstrates the ability to make significant improvements in function in a reasonable and predictable amount of time.     Precautions / Restrictions Precautions Precautions: Fall Restrictions Weight Bearing Restrictions: No      Mobility  Bed Mobility Overal bed  mobility: Needs Assistance Bed Mobility: Supine to Sit     Supine to sit: Max assist, +2 for physical assistance, +2 for safety/equipment, HOB elevated, Used rails Sit to supine: Max assist, +2 for physical assistance, +2 for safety/equipment, Used rails   General bed mobility comments: Able to initiate bed mobility and maintain hand placement on hand railing but required max A x2 to complete task; globally weak and edematous throughout all extremities    Transfers Overall transfer level: Needs assistance Equipment used: None              Lateral/Scoot Transfers: Max assist, +2 physical assistance, +2 safety/equipment General transfer comment: max A x2 for lateral scoot to head of bed with LUE on bed railing for stability    Ambulation/Gait               General Gait Details: Not performed due to increased level of fatigue  Stairs            Wheelchair Mobility     Tilt Bed    Modified Rankin (Stroke Patients Only)       Balance Overall balance assessment: Needs assistance Sitting-balance support: No upper extremity supported, Feet supported Sitting balance-Leahy Scale: Fair Sitting balance - Comments: CGA to maintain steady seated balance on EOB but able to progress to intermittent close supervision Postural control: Posterior lean   Standing balance-Leahy Scale: Zero Standing balance comment: NT due to safety concerns                             Pertinent Vitals/Pain Pain Assessment  Pain Assessment: No/denies pain    Home Living Family/patient expects to be discharged to:: Private residence Living Arrangements: Spouse/significant other Available Help at Discharge: Family;Available 24 hours/day Type of Home: House Home Access: Ramped entrance       Home Layout: One level Home Equipment: Wheelchair - Programmer, multimedia (2 wheels);Wheelchair - power;Lift chair;Electric scooter Additional Comments: 4L O2  at baseline    Prior Function Prior Level of Function : Needs assist       Physical Assist : Mobility (physical);ADLs (physical) Mobility (physical): Bed mobility;Transfers;Gait;Stairs ADLs (physical): Bathing;Dressing;Toileting;IADLs Mobility Comments: using sit to stand lift for transfers from lift chair to wheelchair or bsc, non ambulatory right now; ADLs Comments: SET UP for feeding/grooming, assist for dressing, bathing, toileting at this time however pt does participate; assist for all IADLs     Extremity/Trunk Assessment   Upper Extremity Assessment Upper Extremity Assessment: Defer to OT evaluation    Lower Extremity Assessment Lower Extremity Assessment: Generalized weakness (B (L>R) LE weakness)       Communication   Communication Communication: Difficulty following commands/understanding Following commands: Follows multi-step commands with increased time Cueing Techniques: Verbal cues;Tactile cues;Visual cues  Cognition Arousal: Alert Behavior During Therapy: WFL for tasks assessed/performed Overall Cognitive Status: Impaired/Different from baseline Area of Impairment: Problem solving                             Problem Solving: Slow processing General Comments: AOx4 and agreeable to PT session; slightly fatigued at beginning of seession        General Comments General comments (skin integrity, edema, etc.): Vitals assessed throughout on 8L HFNC and sats ranging 88-93% and 1 event of desat to 68% possibly due to poor reading    Exercises Other Exercises Other Exercises: Multi bouts of seated balance with close sup for safety   Assessment/Plan    PT Assessment Patient needs continued PT services  PT Problem List Decreased strength;Decreased mobility;Decreased range of motion;Decreased activity tolerance;Decreased balance       PT Treatment Interventions DME instruction;Wheelchair mobility training;Functional mobility training;Therapeutic  activities;Therapeutic exercise;Balance training    PT Goals (Current goals can be found in the Care Plan section)  Acute Rehab PT Goals Patient Stated Goal: to go home PT Goal Formulation: With patient Time For Goal Achievement: 07/06/23 Potential to Achieve Goals: Fair    Frequency Min 1X/week     Co-evaluation PT/OT/SLP Co-Evaluation/Treatment: Yes Reason for Co-Treatment: Complexity of the patient's impairments (multi-system involvement);Necessary to address cognition/behavior during functional activity;For patient/therapist safety PT goals addressed during session: Mobility/safety with mobility         AM-PAC PT "6 Clicks" Mobility  Outcome Measure Help needed turning from your back to your side while in a flat bed without using bedrails?: A Lot Help needed moving from lying on your back to sitting on the side of a flat bed without using bedrails?: A Lot Help needed moving to and from a bed to a chair (including a wheelchair)?: Total Help needed standing up from a chair using your arms (e.g., wheelchair or bedside chair)?: Total Help needed to walk in hospital room?: Total Help needed climbing 3-5 steps with a railing? : Total 6 Click Score: 8    End of Session Equipment Utilized During Treatment: Oxygen Activity Tolerance: Patient tolerated treatment well Patient left: in bed;with call bell/phone within reach;with family/visitor present;with SCD's reapplied;with bed alarm set Nurse Communication: Mobility status PT Visit Diagnosis: Unsteadiness on  feet (R26.81);Muscle weakness (generalized) (M62.81);Difficulty in walking, not elsewhere classified (R26.2)    Time: 7829-5621 PT Time Calculation (min) (ACUTE ONLY): 24 min   Charges:                 Elmon Else, SPT   Tejah Brekke 06/28/2023, 3:35 PM

## 2023-06-28 NOTE — Progress Notes (Signed)
[facility] Progress Note  Patient: Jeremy [name]    WNU:[id number]  DOA: 06/21/2023     Date of Service: the patient was seen and examined on 06/28/2023  Chief Complaint  Patient presents with   Shortness of Breath   Brief hospital course: Jeremy [name] is a 72 y.o. male with medical history significant of HTN, HLD, DM, chronic respiratory failure, COPD on 4L O2, dCHF, GIB, depression, MS (wheelchair-bound), pulmonary hypertension, chronic pain, liver cirrhosis, obesity, anemia, who presents with SOB.   Patient was recently hospitalized from 7/12 - 7/26 due to CHF exacerbation and right lobar pneumonia. Patient states that he has worsening shortness of breath in the past several days, which has been progressively worsening.  Patient has cough with clear thick mucus production.  Denies chest pain, fever or chills.  Patient also has worsening bilateral leg edema and 13 pounds of weight gain recently.   Patient is normally using 4L of oxygen, but was found to have severe respiratory distress, using accessory muscle for breathing in ED, oxygen saturation 88% on 5 L oxygen.  Patient was given 1 dose of Lasix to 40 mg, 125 mg of Solu-Medrol and nebulizer treatment in ED. He feels better.  Currently on 8 L of oxygen with 98% of saturation.  Patient had urinary retention with Foley catheter placed recently.  After removal of Foley catheter, patient had bladder scan in PCPs office 2 weeks ago, which showed urinary retention, so Foley catheter was placed again.   ED Course: BNP 465.9, troponin level 21 --> 20, lactic acid of 9.6, WBC 9.1, INR 1.3, Na 120, stable renal function, temperature normal, blood pressure 125/49, heart rate 80s.   VBG with pH 7.42, CO2 46, O2 70.8.   Chest x-ray showed moderate right pleural effusion.     Assessment and Plan:  # Acute on chronic hypoxic respiratory failure most likely due to CHF exacerbation Multifocal pneumonia on CT scan done on 06/26/2023 Patient uses 4  L oxygen at baseline Continue supplemental O2 admission and gradually wean down 9/1 CT chest: Bilateral lung collapse, multifocal pneumonia, bilateral effusion R >L 9/1 started Unasyn, pharmacy consulted and doxycycline 100 mg IV twice daily for aspiration PNA and to cover MRSA as well 9/2 patient's respiratory status got worse on 9/1, moved to stepdown unit, PCCM was consulted.  currently on 8 L oxygen HHFNC Advised to continue incentive spirometry Patient may benefit from repeat thoracentesis. Continue IV Lasix for diuresis, started albumin as below.   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  As per patient's wife patient cannot have more procedures done due to respiratory failure and he may not wake up.  Patient and his wife would prefer conservative management.  8/30 Hb 6.9, 1 unit PRBC transfused. 9/3 Hb 8.5 stable  -Follow-up with CBC   #  Stage 3a chronic kidney disease  Recent baseline 0.90 1.2-1.4.   creatinine 1.16 stable on current diuresis Monitor renal function and urine output S/p IV albumin as above Nephrology consulted for further recommendation   # COPD exacerbation, most likely due to volume overload S/p Solu-Medrol 125 mg given in the ED,  s/p  Solu-Medrol 40 mg IV BID, DC'd on 8/29  S/p  doxycycline 100 mg p.o. twice daily d/c'd on 8/30 Started Mucinex 600 mg p.o. twice daily Continue Robitussin DM as needed   # Pleural effusion Moderate right pleural effusion most likely due to CHF, liver cirrhosis with hypoalbuminemia Continue diuresis as above 8/30 s/p thoracentesis 1.2 L fluid was tapped by IR, postprocedure x-ray did not show any complications. Fluid studies, as per lights criteria, LDH elevated, consistent with exudative but clinically no signs of infection. Started Abx due to CT scan findings of multifocal pneumonia. 9/1 CT chest shows bilateral effusion, right is moderate. 9/3 s/p repeat right thoracentesis, 400 mL fluid was tapped.    IDDM T2 Held home regimen for now Continue Semglee 7 units nightly NovoLog sliding scale Monitor CBG, continue diabetic diet Risk of hyperglycemia due to steroids   Essential hypertension -IV hydralazine as needed -Patient is on IV Lasix   Hyperlipidemia -Zocor and lopid   Multiple sclerosis (HCC) -pt is on Betaseron injection -Fall precaution   Myocardial injury: troponin level 21 --> -20.  No chest pain -Continue Zocor   Folic acid deficiency, folate level 5.2 Started folate supplement.  Hypokalemia secondary to diuresis, potassium repleted. Monitor electrolytes daily  Hypotonic hyponatremia: Resolved Serum osmolality 265 low Na 120.  Mental status normal. Sodium chloride 2 g p.o. 3 times daily for 5 days Continue fluid restriction 1200 ml/d Na 120---132 --144 Check BMP daily now     # Cirrhosis of liver without ascites (HCC): Mental status normal.  INR 1.3 -ammonia level 26 wnl -INR 1.3, PT 16.7, PTT 42, slightly elevated    # Chronic urinary retention: Has a Foley catheter in place since last admission -Continue Flomax Patient has an appointment with urology on 06/29/2023 for voiding trial.  Since patient is here in the hospital, Plan to remove Foley for  voiding trial before discharge planning.    # Depression -Continue home medication, Zoloft  Vitamin D deficiency: started vitamin D 50,000 units p.o. weekly, follow with PCP to repeat vitamin D level after 3 to 6 months.  # Obesity Body mass index is 30.68 kg/m.  Interventions:  Diet: Heart healthy/carb modified, fluid striction 1200 mL/day DVT Prophylaxis: Subcutaneous Lovenox   Advance goals of care discussion: Limited code  Family Communication: family was present at bedside, at the time of interview.  The pt provided permission to discuss medical plan with the family. Opportunity was given to ask question and all questions were answered satisfactorily.   Disposition:  Pt is from home, admitted with hyponatremia, anasarca, diastolic CHF, acute on chronic hypoxic respiratory failure, started IV Lasix infusion, which precludes a safe discharge. Discharge to SNF, when stable.  Subjective: No significant events overnight.  Patient was seen after thoracentesis, denied any complications, breathing remains same, little bit improvement but denies getting worse.  No chest pain or palpitations, no any other active issues.   Physical Exam: General: Mild--Mod respiratory distress, Laying comfortably. Appear in mild- mod distress, affect appropriate depressed Eyes: PERRLA ENT: Oral Mucosa Clear, moist  Neck: no JVD,  Cardiovascular: S1 and S2 Present, no Murmur,  Respiratory: Equal air entry bilaterally, bilateral  crackles and decreased breath sounds in the bibasilar area more decreased on the right lower area due to pleural effusion Abdomen: Bowel Sound present, Soft and no tenderness,  Skin: no rashes Extremities: 2+ edema mostly involving dorsum of feet and ankle,  no calf tenderness, edema improved significantly  Neurologic: without any new focal findings Gait not checked due to patient safety concerns  Vitals:   06/28/23 0700 06/28/23 0900 06/28/23 1000 06/28/23 1100  BP: (!)  128/46 (!) 138/51 (!) 135/113 (!) 109/45  Pulse: 72 79 78 71  Resp: 15 19 20 13   Temp:      TempSrc:      SpO2: 93% 91% 90% 92%  Weight:      Height:        Intake/Output Summary (Last 24 hours) at 06/28/2023 1405 Last data filed at 06/28/2023 1300 Gross per 24 hour  Intake 2052.37 ml  Output 4400 ml  Net -2347.63 ml   Filed Weights   06/26/23 0500 06/27/23 0520 06/28/23 0500  Weight: 105.2 kg 96.5 kg 97 kg    Data Reviewed: I have personally reviewed and interpreted daily labs, tele strips, imagings as discussed above. I reviewed all nursing notes, pharmacy notes, vitals, pertinent old records I have discussed plan of care as described above with RN and patient/family.  CBC: Recent Labs  Lab 06/21/23 1504 06/22/23 0619 06/24/23 0432 06/24/23 1636 06/25/23 0539 06/25/23 1334 06/26/23 0516 06/26/23 1651 06/27/23 0308 06/28/23 0441  WBC 9.1   < > 7.1  --  7.2  --  6.3  --  7.2 6.2  NEUTROABS 7.1  --   --   --   --   --   --   --   --   --   HGB 8.8*   < > 6.9*   < > 8.0* 8.9* 8.2* 10.4* 9.1* 8.5*  HCT 26.7*   < > 20.5*   < > 24.0* 27.4* 24.5* 32.5* 29.1* 26.6*  MCV 86.1   < > 83.3  --  83.9  --  84.2  --  87.4 88.1  PLT 326   < > 304  --  283  --  266  --  272 238   < > = values in this interval not displayed.   Basic Metabolic Panel: Recent Labs  Lab 06/24/23 0432 06/25/23 0539 06/26/23 0516 06/27/23 0308 06/28/23 0441  NA 132* 135 136 140 144  K 3.9 3.3* 3.1* 3.4* 3.2*  CL 94* 100 102 102 106  CO2 25 24 26 28 30   GLUCOSE 104* 115* 83 111* 94  BUN 57* 57* 52* 46* 41*  CREATININE 1.53* 1.44* 1.26* 1.25* 1.16  CALCIUM 8.9 7.8* 7.8* 8.1* 8.1*  MG 2.0 1.9 1.6* 1.9 1.9  PHOS 2.9 2.7 2.9 2.5 3.2    Studies: US THORACENTESIS ASP PLEURAL SPACE W/IMG GUIDE  Result Date: 06/28/2023 INDICATION: 72 year old male. History of positive D-dimer, pneumonia and right-sided pleural effusion. Request is for therapeutic and diagnostic right-sided thoracentesis EXAM:  ULTRASOUND GUIDED THERAPEUTIC AND DIAGNOSTIC RIGHT-SIDED THORACENTESIS MEDICATIONS: Lidocaine 1% 10 mL COMPLICATIONS: None immediate. PROCEDURE: An ultrasound guided thoracentesis was thoroughly discussed with the patient and questions answered. The benefits, risks, alternatives and complications were also discussed. The patient understands and wishes to proceed with the procedure. Written consent was obtained. Ultrasound was performed to localize and mark an adequate pocket of fluid in the right chest. The area was then prepped and draped in the normal sterile fashion. 1% Lidocaine was  used for local anesthesia. Under ultrasound guidance a 6 Fr Safe-T-Centesis catheter was introduced. Thoracentesis was performed. The catheter was removed and a dressing applied. FINDINGS: A total of approximately 400 mL of serosanguineous fluid was removed. Samples were sent to the laboratory as requested by the clinical team. Ultrasound showed right-sided pleural effusion to be loculated. IMPRESSION: Successful ultrasound guided right-sided therapeutic and diagnostic thoracentesis yielding 400 mL of pleural fluid. Performed by: Anders Grant, NP Electronically Signed   By: Gilmer Mor D.O.   On: 06/28/2023 13:20   DG Chest Port 1 View  Result Date: 06/28/2023 CLINICAL DATA:  72 year old male with a history thoracentesis EXAM: PORTABLE CHEST 1 VIEW COMPARISON:  06/27/2023 FINDINGS: Cardiomediastinal silhouette unchanged in size and contour. Similar appearance of mixed interstitial and airspace opacities of the bilateral lungs, with improved aeration at the right lung base. Minimal blunting at the right costophrenic angle, significantly improved from the prior. Obscuration of the left hemidiaphragm persists. No pneumothorax. IMPRESSION: Improved right-sided pleural fluid status post thoracentesis, with no significant residual and no complicating features. Similar appearance of multifocal interstitial and airspace disease of  the bilateral lungs, and persisting opacity at the left lung base. Electronically Signed   By: Gilmer Mor D.O.   On: 06/28/2023 13:16    Scheduled Meds:  vitamin C  500 mg Oral Daily   Chlorhexidine Gluconate Cloth  6 each Topical Daily   fluticasone  1 spray Each Nare Daily   folic acid  1 mg Oral Daily   gemfibrozil  600 mg Oral BID AC   guaiFENesin  600 mg Oral BID   insulin aspart  0-5 Units Subcutaneous QHS   insulin aspart  0-9 Units Subcutaneous TID WC   insulin glargine-yfgn  7 Units Subcutaneous QHS   Interferon Beta-1b  0.25 mg Subcutaneous QODAY   iron polysaccharides  150 mg Oral Daily   loratadine  10 mg Oral Daily   mupirocin ointment  1 Application Nasal BID   nystatin   Topical TID   mouth rinse  15 mL Mouth Rinse 4 times per day   pantoprazole  40 mg Oral BID   potassium chloride  40 mEq Oral TID   pregabalin  200 mg Oral BID   sertraline  25 mg Oral Daily   simvastatin  40 mg Oral q1800   tamsulosin  0.4 mg Oral QPC supper   Vitamin D (Ergocalciferol)  50,000 Units Oral Q7 days   Continuous Infusions:  sodium chloride Stopped (06/27/23 2352)   albumin human 60 mL/hr at 06/28/23 1113   ampicillin-sulbactam (UNASYN) IV Stopped (06/28/23 1000)   doxycycline (VIBRAMYCIN) IV Stopped (06/28/23 0439)   furosemide (LASIX) 200 mg in dextrose 5 % 100 mL (2 mg/mL) infusion 4 mg/hr (06/28/23 1113)   PRN Meds: sodium chloride, acetaminophen, albuterol, guaiFENesin-dextromethorphan, hydrALAZINE, ondansetron (ZOFRAN) IV, mouth rinse, traMADol  Time spent: 55 minutes  Author: Gillis Santa. MD [facility] 06/28/2023 2:05 PM  To reach On-call, see care teams to locate the attending and reach out to them via [email or web address]. If 7PM-7AM, please contact night-coverage If you still have difficulty reaching the attending provider, please page the [facility] (Director on Call) for [facility] on amion for assistance.

## 2023-06-28 NOTE — Procedures (Signed)
Ultrasound-guided diagnostic and therapeutic right sided thoracentesis performed yielding 400 milliliters of serosanguinous colored fluid. No immediate complications.   Diagnostic fluid was sent to the lab for further analysis. Follow-up chest x-ray pending. EBL is < 2 ml  US shows right sided pleural effusion is loculated. Team made aware of findings. Jeremy [name]

## 2023-06-28 NOTE — Evaluation (Signed)
Occupational Therapy Re-Evaluation Patient Details Name: Jeremy [name] MRN: [id number] DOB: [birth year] Today's Date: 06/28/2023   History of Present Illness Pt is a 72 year old male presenting to ED with SOB, admitted with acute on chronic respiratory failure due to CHF and COPD exacerbation, pleural effusion     PMH significant for HTN, HLD, DM, chronic respiratory failure, COPD on 4L O2, dCHF, GIB, depression, MS (wheelchair-bound), pulmonary hypertension, chronic pain, liver cirrhosis, obesity, anemia.   Clinical Impression   Pt seen for co-eval with PT to maximize therapeutic outcomes and pt safety (re-eval due to transfer to [facility]). Pt currently resting win bed with spouse present, on 8L HFNC. Vitals monitored, sats between 88-93%, one desat occurance down to 68% (possible low reading), RN notified. Patient currently functioning below baseline, requiring +2 for bed mobility with low tolerance to activity. Benefits from cues for PLB to conserve energy. Able to tolerate sitting EOB to wash face with overall setup. Anticipate need for maxA for LB ADLs and min-modA for UB due to generalized weakness + fatigue. Patient will benefit from acute OT to increase overall independence in the areas of ADLs + functional mobility in order to safely discharge.       If plan is discharge home, recommend the following: A lot of help with walking and/or transfers;A lot of help with bathing/dressing/bathroom;Assistance with cooking/housework;Assist for transportation;Help with stairs or ramp for entrance;Direct supervision/assist for financial management       Equipment Recommendations  None recommended by OT       Precautions / Restrictions Precautions Precautions: Fall Restrictions Weight Bearing Restrictions: No      Mobility Bed Mobility Overal bed mobility: Needs Assistance Bed Mobility: Supine to Sit     Supine to sit: Max assist, +2 for physical assistance, +2 for safety/equipment, HOB  elevated, Used rails Sit to supine: Max assist, +2 for physical assistance, +2 for safety/equipment, Used rails   General bed mobility comments: Able to initiate bed mobility and maintain hand placement on hand railing but required max A x2 to complete task; globally weak and edematous throughout all extremities    Transfers Overall transfer level: Needs assistance Equipment used: None              Lateral/Scoot Transfers: Max assist, +2 physical assistance, +2 safety/equipment General transfer comment: max A x2 for lateral scoot to head of bed with LUE on bed railing for stability      Balance Overall balance assessment: Needs assistance Sitting-balance support: No upper extremity supported, Feet supported Sitting balance-Leahy Scale: Fair Sitting balance - Comments: CGA to maintain steady seated balance on EOB but able to progress to intermittent close supervision Postural control: Posterior lean   Standing balance-Leahy Scale: Zero Standing balance comment: NT due to safety concerns                           ADL either performed or assessed with clinical judgement   ADL Overall ADL's : Needs assistance/impaired     Grooming: Wash/dry face;Set up Grooming Details (indicate cue type and reason): sits EOB to wash face with setup/assist to remove glasses                               General ADL Comments: +2 for bed mobility, very limited tolerance to activity, will require MAX for LB and MIN-MOD for UB.     Vision  Vision Assessment?:  (wears glasses)            Pertinent Vitals/Pain Pain Assessment Pain Assessment: No/denies pain     Extremity/Trunk Assessment Upper Extremity Assessment Upper Extremity Assessment: Generalized weakness RUE Deficits / Details: AROM: shoulder flexion: approx 1/2 full AROM, elbow 3/4 full AROM, wrist 3/4 full AROM; PROM appears WFL; grossly weak throughout RUE Sensation: WNL RUE Coordination: decreased  fine motor;decreased gross motor LUE Deficits / Details: AROM: shoulder flexion: approx 1/2 full AROM, elbow 3/4 full AROM, wrist 3/4 full AROM; PROM appears WFL; grossly weak throughout LUE Sensation: WNL LUE Coordination: decreased fine motor;decreased gross motor   Lower Extremity Assessment Lower Extremity Assessment: Defer to PT evaluation       Communication Communication Communication: Difficulty following commands/understanding Following commands: Follows multi-step commands with increased time Cueing Techniques: Verbal cues;Tactile cues;Visual cues   Cognition Arousal: Alert Behavior During Therapy: WFL for tasks assessed/performed Overall Cognitive Status: Impaired/Different from baseline Area of Impairment: Problem solving                             Problem Solving: Slow processing General Comments: AOx4 and agreeable to PT session; slightly fatigued at beginning of seession     General Comments  vitals assessed. on 8L HFNC, sats between 88-93%, one desat occurance down to 68% (possible low reading)            Home Living Family/patient expects to be discharged to:: Private residence Living Arrangements: Spouse/significant other Available Help at Discharge: Family;Available 24 hours/day Type of Home: House Home Access: Ramped entrance     Home Layout: One level     Bathroom Shower/Tub: Sponge bathes at baseline   Bathroom Toilet: Handicapped height     Home Equipment: Wheelchair - Programmer, multimedia (2 wheels);Wheelchair - power;Lift chair;Electric scooter   Additional Comments: 4L O2 at baseline      Prior Functioning/Environment Prior Level of Function : Needs assist       Physical Assist : Mobility (physical);ADLs (physical) Mobility (physical): Bed mobility;Transfers;Gait;Stairs ADLs (physical): Bathing;Dressing;Toileting;IADLs Mobility Comments: using sit to stand lift for transfers from lift chair to  wheelchair or bsc, non ambulatory right now; ADLs Comments: SET UP for feeding/grooming, assist for dressing, bathing, toileting at this time however pt does participate; assist for all IADLs                OT Goals(Current goals can be found in the care plan section) Acute Rehab OT Goals Patient Stated Goal: to go home OT Goal Formulation: With patient/family Time For Goal Achievement: 07/12/23 Potential to Achieve Goals: Good ADL Goals Pt Will Perform Grooming: with set-up;sitting Pt Will Perform Lower Body Dressing: with mod assist;sitting/lateral leans Pt Will Transfer to Toilet: with max assist;squat pivot transfer Pt Will Perform Toileting - Clothing Manipulation and hygiene: with max assist;sitting/lateral leans  OT Frequency: Min 1X/week    Co-evaluation PT/OT/SLP Co-Evaluation/Treatment: Yes Reason for Co-Treatment: Complexity of the patient's impairments (multi-system involvement);Necessary to address cognition/behavior during functional activity;For patient/therapist safety PT goals addressed during session: Mobility/safety with mobility OT goals addressed during session: ADL's and self-care      AM-PAC OT "6 Clicks" Daily Activity     Outcome Measure Help from another person eating meals?: A Little Help from another person taking care of personal grooming?: A Little Help from another person toileting, which includes using toliet, bedpan, or urinal?: Total Help from another person bathing (including washing, rinsing,  drying)?: Total Help from another person to put on and taking off regular upper body clothing?: A Lot Help from another person to put on and taking off regular lower body clothing?: A Lot 6 Click Score: 12   End of Session Equipment Utilized During Treatment: Oxygen Nurse Communication: Mobility status  Activity Tolerance: Patient tolerated treatment well Patient left: in bed;with call bell/phone within reach;with family/visitor present  OT Visit  Diagnosis: Other abnormalities of gait and mobility (R26.89);Muscle weakness (generalized) (M62.81)                Time: 1610-9604 OT Time Calculation (min): 24 min Charges:  OT General Charges $OT Visit: 1 Visit OT Evaluation $OT Re-eval: 1 Re-eval OT Treatments $Self Care/Home Management : 8-22 mins  Wilder Kurowski L. Deangelo Berns, OTR/L  06/28/23, 4:45 PM

## 2023-06-28 NOTE — Progress Notes (Signed)
PT Cancellation Note  Patient Details Name: Jeremy [name] MRN: [id number] DOB: 1951-05-31   Cancelled Treatment:    Reason Eval/Treat Not Completed: Other (comment). Pt getting prepared for thoracocentesis at this time. Will re-attempt PT re-eval at a later time.   Charlii Yost 06/28/2023, 11:49 AM

## 2023-06-28 NOTE — Progress Notes (Signed)
[facility]  ROUNDING NOTE   Subjective:   Jeremy [name] is a 72 year old male with past medical conditions including hyperlipidemia, diabetes, hypertension, respiratory failure, COPD with 4 L nasal cannula at baseline, depression, diastolic heart failure, multiple sclerosis, liver cirrhosis, anemia, pulmonary hypertension, and chronic kidney disease stage IIIa.  Patient presents to the emergency department complaining of shortness of breath and has been admitted for Acute on chronic respiratory failure with hypoxia [facility]) [J96.21]  Patient is known to our practice from outpatient follow-up.  Patient was last seen in office on 07/01/2022 for routine follow-up.    Patient was transferred to ICU overnight due to concern of chest pain. Remains on high flow oxygen by nasal cannula. Generalized edema slowly improving  Creatinine slowly improving and is now in the normal range. Urine output 2900 cc in 24 hours.  Another 1500 has been collected today already.  IV furosemide drip at 4 mg/h He underwent CT angiogram to rule out PE and was started on IV Unasyn and IV doxycycline.  Objective:  Vital signs in last 24 hours:  Temp:  [98.1 F (36.7 C)-98.2 F (36.8 C)] 98.1 F (36.7 C) (09/03 0400) Pulse Rate:  [68-102] 71 (09/03 1100) Resp:  [13-25] 13 (09/03 1100) BP: (109-149)/(41-113) 109/45 (09/03 1100) SpO2:  [90 %-100 %] 92 % (09/03 1100) Weight:  [97 kg] 97 kg (09/03 0500)  Weight change: 0.5 kg Filed Weights   06/26/23 0500 06/27/23 0520 06/28/23 0500  Weight: 105.2 kg 96.5 kg 97 kg    Intake/Output: I/O last 3 completed shifts: In: 3223.3 [P.O.:1560; I.V.:272.2; IV Piggyback:1391.1] Out: 4640 [Urine:4640]   Intake/Output this shift:  Total I/O In: 377.6 [P.O.:240; I.V.:8.4; IV Piggyback:129.2] Out: 1500 [Urine:1500]  Physical Exam: General: NAD  Head: Normocephalic, atraumatic. Moist oral mucosal membranes  Eyes: Anicteric  Lungs:  Basilar wheeze, 4 L Farrell   Heart: Regular rate and rhythm  Abdomen:  Soft, nontender, nondistended  Extremities: 2+ dependent peripheral edema.  Neurologic: Alert and oriented, moving all four extremities  Skin: No lesions  GU Foley catheter    Basic Metabolic Panel: Recent Labs  Lab 06/24/23 0432 06/25/23 0539 06/26/23 0516 06/27/23 0308 06/28/23 0441  NA 132* 135 136 140 144  K 3.9 3.3* 3.1* 3.4* 3.2*  CL 94* 100 102 102 106  CO2 25 24 26 28 30   GLUCOSE 104* 115* 83 111* 94  BUN 57* 57* 52* 46* 41*  CREATININE 1.53* 1.44* 1.26* 1.25* 1.16  CALCIUM 8.9 7.8* 7.8* 8.1* 8.1*  MG 2.0 1.9 1.6* 1.9 1.9  PHOS 2.9 2.7 2.9 2.5 3.2    Liver Function Tests: Recent Labs  Lab 06/21/23 1504 06/24/23 0432 06/25/23 0539 06/26/23 0516 06/27/23 0308  AST 27 21 27 18 21   ALT 10 12 12 12 13   ALKPHOS 95 61 57 58 66  BILITOT 0.9 0.8 0.9 1.0 1.3*  PROT 6.6 5.5* 5.1* 5.1* 6.2*  ALBUMIN 2.1* 1.9* 2.1* 2.3* 2.7*   No results for input(s): "LIPASE", "AMYLASE" in the last 168 hours. Recent Labs  Lab 06/21/23 2019  AMMONIA 26    CBC: Recent Labs  Lab 06/21/23 1504 06/22/23 0619 06/24/23 0432 06/24/23 1636 06/25/23 0539 06/25/23 1334 06/26/23 0516 06/26/23 1651 06/27/23 0308 06/28/23 0441  WBC 9.1   < > 7.1  --  7.2  --  6.3  --  7.2 6.2  NEUTROABS 7.1  --   --   --   --   --   --   --   --   --  HGB 8.8*   < > 6.9*   < > 8.0* 8.9* 8.2* 10.4* 9.1* 8.5*  HCT 26.7*   < > 20.5*   < > 24.0* 27.4* 24.5* 32.5* 29.1* 26.6*  MCV 86.1   < > 83.3  --  83.9  --  84.2  --  87.4 88.1  PLT 326   < > 304  --  283  --  266  --  272 238   < > = values in this interval not displayed.    Cardiac Enzymes: No results for input(s): "CKTOTAL", "CKMB", "CKMBINDEX", "TROPONINI" in the last 168 hours.  BNP: Invalid input(s): "POCBNP"  CBG: Recent Labs  Lab 06/27/23 0726 06/27/23 1123 06/27/23 1634 06/27/23 2105 06/28/23 1211  GLUCAP 70 155* 154* 148* 106*    Microbiology: Results for orders placed or  performed during the hospital encounter of 06/21/23  Culture, blood (Routine x 2)     Status: None   Collection Time: 06/21/23  3:02 PM   Specimen: BLOOD  Result Value Ref Range Status   Specimen Description BLOOD LEFT ANTECUBITAL  Final   Special Requests   Final    BOTTLES DRAWN AEROBIC AND ANAEROBIC Blood Culture adequate volume   Culture   Final    NO GROWTH 5 DAYS Performed at [facility], [address], [place], [place] [address]    Report Status 06/26/2023 FINAL  Final  Culture, blood (Routine x 2)     Status: None   Collection Time: 06/21/23  3:13 PM   Specimen: BLOOD  Result Value Ref Range Status   Specimen Description BLOOD RIGHT ANTECUBITAL  Final   Special Requests   Final    BOTTLES DRAWN AEROBIC AND ANAEROBIC Blood Culture adequate volume   Culture   Final    NO GROWTH 5 DAYS Performed at [facility], [address] [place], [place] [address]    Report Status 06/26/2023 FINAL  Final  Resp panel by RT-PCR (RSV, Flu A&B, Covid) Anterior Nasal Swab     Status: None   Collection Time: 06/21/23  4:35 PM   Specimen: Anterior Nasal Swab  Result Value Ref Range Status   SARS Coronavirus 2 by RT PCR NEGATIVE NEGATIVE Final    Comment: (NOTE) SARS-CoV-2 target nucleic acids are NOT DETECTED.  The SARS-CoV-2 RNA is generally detectable in upper respiratory specimens during the acute phase of infection. The lowest concentration of SARS-CoV-2 viral copies this assay can detect is 138 copies/mL. A negative result does not preclude SARS-Cov-2 infection and should not be used as the sole basis for treatment or other patient management decisions. A negative result may occur with  improper specimen collection/handling, submission of specimen other than nasopharyngeal swab, presence of viral mutation(s) within the areas targeted by this assay, and inadequate number of viral copies(<138 copies/mL). A negative result must be combined with clinical  observations, patient history, and epidemiological information. The expected result is Negative.  Fact Sheet for Patients:  BloggerCourse.com  Fact Sheet for Healthcare Providers:  SeriousBroker.it  This test is no t yet approved or cleared by the [place] FDA and  has been authorized for detection and/or diagnosis of SARS-CoV-2 by FDA under an Emergency Use Authorization (EUA). This EUA will remain  in effect (meaning this test can be used) for the duration of the COVID-19 declaration under Section 564(b)(1) of the Act, 21 U.S.C.section 360bbb-3(b)(1), unless the authorization is terminated  or revoked sooner.       Influenza  A by PCR NEGATIVE NEGATIVE Final   Influenza B by PCR NEGATIVE NEGATIVE Final    Comment: (NOTE) The Xpert Xpress SARS-CoV-2/FLU/RSV plus assay is intended as an aid in the diagnosis of influenza from Nasopharyngeal swab specimens and should not be used as a sole basis for treatment. Nasal washings and aspirates are unacceptable for Xpert Xpress SARS-CoV-2/FLU/RSV testing.  Fact Sheet for Patients: BloggerCourse.com  Fact Sheet for Healthcare Providers: SeriousBroker.it  This test is not yet approved or cleared by the [place] FDA and has been authorized for detection and/or diagnosis of SARS-CoV-2 by FDA under an Emergency Use Authorization (EUA). This EUA will remain in effect (meaning this test can be used) for the duration of the COVID-19 declaration under Section 564(b)(1) of the Act, 21 U.S.C. section 360bbb-3(b)(1), unless the authorization is terminated or revoked.     Resp Syncytial Virus by PCR NEGATIVE NEGATIVE Final    Comment: (NOTE) Fact Sheet for Patients: BloggerCourse.com  Fact Sheet for Healthcare Providers: SeriousBroker.it  This test is not yet approved or cleared by  the [place] FDA and has been authorized for detection and/or diagnosis of SARS-CoV-2 by FDA under an Emergency Use Authorization (EUA). This EUA will remain in effect (meaning this test can be used) for the duration of the COVID-19 declaration under Section 564(b)(1) of the Act, 21 U.S.C. section 360bbb-3(b)(1), unless the authorization is terminated or revoked.  Performed at [facility], [address] [place], [place] [address]   MRSA Next Gen by PCR, Nasal     Status: Abnormal   Collection Time: 06/21/23  8:14 PM   Specimen: Nasal Mucosa; Nasal Swab  Result Value Ref Range Status   MRSA by PCR Next Gen DETECTED (A) NOT DETECTED Final    Comment: RESULT CALLED TO, READ BACK BY AND VERIFIED WITH: KRISTY SNEEAD AT 2206 ON 06/21/23 BY SS (NOTE) The GeneXpert MRSA Assay (FDA approved for NASAL specimens only), is one component of a comprehensive MRSA colonization surveillance program. It is not intended to diagnose MRSA infection nor to guide or monitor treatment for MRSA infections. Test performance is not FDA approved in patients less than 84 years old. Performed at [facility], [address] [place], [place] [address]   Respiratory (~20 pathogens) panel by PCR     Status: None   Collection Time: 06/22/23  3:22 PM   Specimen: Nasopharyngeal Swab; Respiratory  Result Value Ref Range Status   Adenovirus NOT DETECTED NOT DETECTED Final   Coronavirus 229E NOT DETECTED NOT DETECTED Final    Comment: (NOTE) The Coronavirus on the Respiratory Panel, DOES NOT test for the novel  Coronavirus (2019 nCoV)    Coronavirus HKU1 NOT DETECTED NOT DETECTED Final   Coronavirus NL63 NOT DETECTED NOT DETECTED Final   Coronavirus OC43 NOT DETECTED NOT DETECTED Final   Metapneumovirus NOT DETECTED NOT DETECTED Final   Rhinovirus / Enterovirus NOT DETECTED NOT DETECTED Final   Influenza A NOT DETECTED NOT DETECTED Final   Influenza B NOT DETECTED NOT DETECTED Final    Parainfluenza Virus 1 NOT DETECTED NOT DETECTED Final   Parainfluenza Virus 2 NOT DETECTED NOT DETECTED Final   Parainfluenza Virus 3 NOT DETECTED NOT DETECTED Final   Parainfluenza Virus 4 NOT DETECTED NOT DETECTED Final   Respiratory Syncytial Virus NOT DETECTED NOT DETECTED Final   Bordetella pertussis NOT DETECTED NOT DETECTED Final   Bordetella Parapertussis NOT DETECTED NOT DETECTED Final   Chlamydophila pneumoniae NOT DETECTED NOT DETECTED Final  Mycoplasma pneumoniae NOT DETECTED NOT DETECTED Final    Comment: Performed at [facility], [address] [place], [place] [address]  Body fluid culture w Gram Stain     Status: None   Collection Time: 06/24/23 12:12 PM   Specimen: PATH Cytology Peritoneal fluid  Result Value Ref Range Status   Specimen Description   Final    PERITONEAL Performed at [facility], [address], [place], [place] [address]    Special Requests   Final    NONE Performed at [facility], [address] [place], [place] [address]    Gram Stain NO WBC SEEN NO ORGANISMS SEEN   Final   Culture   Final    NO GROWTH 3 DAYS Performed at [facility], [address] [place], [place] [address]    Report Status 06/27/2023 FINAL  Final  MRSA Next Gen by PCR, Nasal     Status: Abnormal   Collection Time: 06/26/23  5:43 PM   Specimen: Nasal Mucosa; Nasal Swab  Result Value Ref Range Status   MRSA by PCR Next Gen DETECTED (A) NOT DETECTED Final    Comment: RESULT CALLED TO, READ BACK BY AND VERIFIED WITH: [facility] WHITE 06/26/2023 2055 CP (NOTE) The GeneXpert MRSA Assay (FDA approved for NASAL specimens only), is one component of a comprehensive MRSA colonization surveillance program. It is not intended to diagnose MRSA infection nor to guide or monitor treatment for MRSA infections. Test performance is not FDA approved in patients less than 92 years old. Performed at [facility], [address]  [place], [place] [address]     Coagulation Studies: Recent Labs    06/27/23 0308  LABPROT 19.5*  INR 1.6*    Urinalysis: No results for input(s): "COLORURINE", "LABSPEC", "PHURINE", "GLUCOSEU", "HGBUR", "BILIRUBINUR", "KETONESUR", "PROTEINUR", "UROBILINOGEN", "NITRITE", "LEUKOCYTESUR" in the last 72 hours.  Invalid input(s): "APPERANCEUR"    Imaging: US THORACENTESIS ASP PLEURAL SPACE W/IMG GUIDE  Result Date: 06/28/2023 INDICATION: 72 year old male. History of positive D-dimer, pneumonia and right-sided pleural effusion. Request is for therapeutic and diagnostic right-sided thoracentesis EXAM: ULTRASOUND GUIDED THERAPEUTIC AND DIAGNOSTIC RIGHT-SIDED THORACENTESIS MEDICATIONS: Lidocaine 1% 10 mL COMPLICATIONS: None immediate. PROCEDURE: An ultrasound guided thoracentesis was thoroughly discussed with the patient and questions answered. The benefits, risks, alternatives and complications were also discussed. The patient understands and wishes to proceed with the procedure. Written consent was obtained. Ultrasound was performed to localize and mark an adequate pocket of fluid in the right chest. The area was then prepped and draped in the normal sterile fashion. 1% Lidocaine was used for local anesthesia. Under ultrasound guidance a 6 Fr Safe-T-Centesis catheter was introduced. Thoracentesis was performed. The catheter was removed and a dressing applied. FINDINGS: A total of approximately 400 mL of serosanguineous fluid was removed. Samples were sent to the laboratory as requested by the clinical team. Ultrasound showed right-sided pleural effusion to be loculated. IMPRESSION: Successful ultrasound guided right-sided therapeutic and diagnostic thoracentesis yielding 400 mL of pleural fluid. Performed by: Anders Grant, NP Electronically Signed   By: Gilmer Mor D.O.   On: 06/28/2023 13:20   DG Chest Port 1 View  Result Date: 06/28/2023 CLINICAL DATA:  72 year old male with a history  thoracentesis EXAM: PORTABLE CHEST 1 VIEW COMPARISON:  06/27/2023 FINDINGS: Cardiomediastinal silhouette unchanged in size and contour. Similar appearance of mixed interstitial and airspace opacities of the bilateral lungs, with improved aeration at the right lung base. Minimal blunting at the right  costophrenic angle, significantly improved from the prior. Obscuration of the left hemidiaphragm persists. No pneumothorax. IMPRESSION: Improved right-sided pleural fluid status post thoracentesis, with no significant residual and no complicating features. Similar appearance of multifocal interstitial and airspace disease of the bilateral lungs, and persisting opacity at the left lung base. Electronically Signed   By: Gilmer Mor D.O.   On: 06/28/2023 13:16   CT Angio Chest Pulmonary Embolism (PE) W or WO Contrast  Result Date: 06/27/2023 CLINICAL DATA:  72 year old male with history of positive D-dimer. Low to intermediate probability of pulmonary embolism. EXAM: CT ANGIOGRAPHY CHEST WITH CONTRAST TECHNIQUE: Multidetector CT imaging of the chest was performed using the standard protocol during bolus administration of intravenous contrast. Multiplanar CT image reconstructions and MIPs were obtained to evaluate the vascular anatomy. RADIATION DOSE REDUCTION: This exam was performed according to the departmental dose-optimization program which includes automated exposure control, adjustment of the mA and/or kV according to patient size and/or use of iterative reconstruction technique. CONTRAST:  75mL OMNIPAQUE IOHEXOL 350 MG/ML SOLN COMPARISON:  Chest CT 06/26/2023. FINDINGS: Cardiovascular: No filling defects within the pulmonary arterial tree to suggest pulmonary embolism. Heart size is mildly enlarged. There is no significant pericardial fluid, thickening or pericardial calcification. There is aortic atherosclerosis, as well as atherosclerosis of the great vessels of the mediastinum and the coronary arteries,  including calcified atherosclerotic plaque in the left main, left anterior descending and left circumflex coronary arteries. Mediastinum/Nodes: No pathologically enlarged mediastinal or hilar lymph nodes. Esophagus is unremarkable in appearance. No axillary lymphadenopathy. Lungs/Pleura: 13 mm pleural-based nodule in the periphery of the right upper lobe (axial image 54 of series 5), similar on prior exams dating back to 10/07/2021, previously not hypermetabolic on prior PET-CT 11/30/2021, likely benign. Several patchy areas of ground-glass attenuation are again noted in the lungs bilaterally, randomly distributed, likely of infectious or inflammatory etiology. In addition, there is complete atelectasis of the right lower lobe, as well as some passive subsegmental atelectasis in the lateral segment of the right middle lobe and dependent portions of the left lower lobe. Moderate to large right and small left pleural effusions lie dependently. Upper Abdomen: Liver has a shrunken appearance and nodular contour, indicative of underlying cirrhosis. Small to moderate volume of ascites in the upper abdomen. Musculoskeletal: There are no aggressive appearing lytic or blastic lesions noted in the visualized portions of the skeleton. Review of the MIP images confirms the above findings. IMPRESSION: 1. No evidence of pulmonary embolism. 2. Moderate to large right and small left pleural effusions persist with associated areas of atelectasis, as above. 3. Patchy areas of ground-glass attenuation scattered throughout the lungs bilaterally, presumably of infectious or inflammatory etiology. 4. Stable right upper lobe pulmonary nodule dating back to 10/07/2021, previously not hypermetabolic, considered benign. 5. Cirrhosis. 6. Ascites. 7. Aortic atherosclerosis, in addition to left main and 2 vessel coronary artery disease. Please note that although the presence of coronary artery calcium documents the presence of coronary artery  disease, the severity of this disease and any potential stenosis cannot be assessed on this non-gated CT examination. Assessment for potential risk factor modification, dietary therapy or pharmacologic therapy may be warranted, if clinically indicated. Aortic Atherosclerosis (ICD10-I70.0). Electronically Signed   By: Trudie Reed M.D.   On: 06/27/2023 05:56   DG Chest Port 1 View  Result Date: 06/27/2023 CLINICAL DATA:  Chest EXAM: PORTABLE CHEST 1 VIEW COMPARISON:  06/24/2023 FINDINGS: Heart and mediastinal contours within normal limits. Worsening bilateral airspace disease, right  greater than left. Moderate right pleural effusion. Small left pleural effusion. No acute bony abnormality. IMPRESSION: Bilateral pleural effusions, right greater than left with worsening bilateral airspace disease, also greater than right. This could reflect asymmetric edema or infection. Electronically Signed   By: Charlett Nose M.D.   On: 06/27/2023 03:06     Medications:    sodium chloride Stopped (06/27/23 2352)   albumin human 60 mL/hr at 06/28/23 1113   ampicillin-sulbactam (UNASYN) IV Stopped (06/28/23 1000)   doxycycline (VIBRAMYCIN) IV Stopped (06/28/23 0439)   furosemide (LASIX) 200 mg in dextrose 5 % 100 mL (2 mg/mL) infusion 4 mg/hr (06/28/23 1113)    vitamin C  500 mg Oral Daily   Chlorhexidine Gluconate Cloth  6 each Topical Daily   fluticasone  1 spray Each Nare Daily   folic acid  1 mg Oral Daily   gemfibrozil  600 mg Oral BID AC   guaiFENesin  600 mg Oral BID   insulin aspart  0-5 Units Subcutaneous QHS   insulin aspart  0-9 Units Subcutaneous TID WC   insulin glargine-yfgn  7 Units Subcutaneous QHS   Interferon Beta-1b  0.25 mg Subcutaneous QODAY   iron polysaccharides  150 mg Oral Daily   loratadine  10 mg Oral Daily   mupirocin ointment  1 Application Nasal BID   nystatin   Topical TID   mouth rinse  15 mL Mouth Rinse 4 times per day   pantoprazole  40 mg Oral BID   potassium chloride   40 mEq Oral TID   pregabalin  200 mg Oral BID   sertraline  25 mg Oral Daily   simvastatin  40 mg Oral q1800   tamsulosin  0.4 mg Oral QPC supper   Vitamin D (Ergocalciferol)  50,000 Units Oral Q7 days   sodium chloride, acetaminophen, albuterol, guaiFENesin-dextromethorphan, hydrALAZINE, ondansetron (ZOFRAN) IV, mouth rinse, traMADol  Assessment/ Plan:  [name] is a 72 y.o.  male with past medical conditions including hyperlipidemia, diabetes, hypertension, respiratory failure, COPD with 4 L nasal cannula at baseline, depression, diastolic heart failure, multiple sclerosis, liver cirrhosis, anemia, pulmonary hypertension, and chronic kidney disease stage IIIa.  Patient presents to the emergency department complaining of shortness of breath and has been admitted for Acute on chronic respiratory failure with hypoxia (HCC) [J96.21]   Chronic kidney disease stage 2, patient currently at baseline renal function. Chronic kidney disease is secondary to diabetes, hypertension, advanced age Creatinine remained stable with adequate response to diuretic therapy.  Continue diuresis at prescribed rate. Patient underwent CT angiogram on 06/27/2023.  Will monitor the effect of IV contrast exposure on renal function.  Lab Results  Component Value Date   CREATININE 1.16 06/28/2023   CREATININE 1.25 (H) 06/27/2023   CREATININE 1.26 (H) 06/26/2023    Intake/Output Summary (Last 24 hours) at 06/28/2023 1448 Last data filed at 06/28/2023 1300 Gross per 24 hour  Intake 2052.37 ml  Output 4400 ml  Net -2347.63 ml   2.  Volume overload, appears multifactorial with contributing factors of COPD   .  CT angiogram from 06/26/2023-large right pleural effusion. -Continues to have dependent edema over thighs.  Continued on IV furosemide at 4 mg/h with good response. Currently getting low-dose IV albumin supplementation, 12.5 g twice a day Right thoracentesis done on 06/28/2023.  400 cc fluid removed.  3.  Anemia of chronic kidney disease Lab Results  Component Value Date   HGB 8.5 (L) 06/28/2023    Hemoglobin improved with  1 unit blood transfusion this admission on 06/23/2023.  4.  Diabetes mellitus type II with chronic kidney disease/renal manifestations: insulin dependent. Home regimen includes Lantus. Most recent hemoglobin A1c is 4.4 on 11/24/22.     LOS: 7 Jeremy [name] 9/3/20242:48 PM

## 2023-06-28 NOTE — Progress Notes (Addendum)
Daily Progress Note   Patient Name: Jeremy [name]       Date: 06/28/2023 DOB: [birth year]  Age: 72 y.o. MRN#: [id number] Attending Physician: Gillis Santa, MD Primary Care Physician: Duanne Limerick, MD Admit Date: 06/21/2023  Reason for Consultation/Follow-up: Establishing goals of care  Subjective: Notes and labs reviewed.  In to see patient.  His wife is at bedside.  He and his wife have been kept well updated.  Patient and wife confirm that patient has given thought to CODE STATUS and decided for a DNR/DNI status, and to continue to treat the treatable otherwise.   Length of Stay: 7  Current Medications: Scheduled Meds:   vitamin C  500 mg Oral Daily   Chlorhexidine Gluconate Cloth  6 each Topical Daily   fluticasone  1 spray Each Nare Daily   folic acid  1 mg Oral Daily   gemfibrozil  600 mg Oral BID AC   guaiFENesin  600 mg Oral BID   insulin aspart  0-5 Units Subcutaneous QHS   insulin aspart  0-9 Units Subcutaneous TID WC   insulin glargine-yfgn  7 Units Subcutaneous QHS   Interferon Beta-1b  0.25 mg Subcutaneous QODAY   iron polysaccharides  150 mg Oral Daily   loratadine  10 mg Oral Daily   mupirocin ointment  1 Application Nasal BID   nystatin   Topical TID   mouth rinse  15 mL Mouth Rinse 4 times per day   pantoprazole  40 mg Oral BID   pregabalin  200 mg Oral BID   sertraline  25 mg Oral Daily   simvastatin  40 mg Oral q1800   tamsulosin  0.4 mg Oral QPC supper   Vitamin D (Ergocalciferol)  50,000 Units Oral Q7 days    Continuous Infusions:  sodium chloride Stopped (06/27/23 2352)   albumin human 60 mL/hr at 06/28/23 1113   ampicillin-sulbactam (UNASYN) IV 3 g (06/28/23 1552)   doxycycline (VIBRAMYCIN) IV Stopped (06/28/23 0439)   furosemide (LASIX) 200 mg in  dextrose 5 % 100 mL (2 mg/mL) infusion 4 mg/hr (06/28/23 1113)    PRN Meds: sodium chloride, acetaminophen, albuterol, guaiFENesin-dextromethorphan, hydrALAZINE, ondansetron (ZOFRAN) IV, mouth rinse, traMADol  Physical Exam Pulmonary:     Effort: Pulmonary effort is normal.  Neurological:     Mental Status: He is  alert.             Vital Signs: BP (!) 109/45   Pulse 71   Temp 98.1 F (36.7 C) (Oral)   Resp 13   Ht 5\' 10"  (1.778 m)   Wt 97 kg   SpO2 92%   BMI 30.68 kg/m  SpO2: SpO2: 92 % O2 Device: O2 Device: High Flow Nasal Cannula O2 Flow Rate: O2 Flow Rate (L/min): 8 L/min  Intake/output summary:  Intake/Output Summary (Last 24 hours) at 06/28/2023 1615 Last data filed at 06/28/2023 1300 Gross per 24 hour  Intake 1584.24 ml  Output 4400 ml  Net -2815.76 ml   LBM: Last BM Date : 06/27/23 Baseline Weight: Weight: 102.4 kg Most recent weight: Weight: 97 kg     Patient Active Problem List   Diagnosis Date Noted   COPD exacerbation (HCC) 06/21/2023   Obesity (BMI 30-39.9) 06/21/2023   Hyponatremia 06/21/2023   Pleural effusion 06/21/2023   Urinary retention 06/21/2023   Myocardial injury 05/11/2023   Moderate pulmonary hypertension (HCC) 05/09/2023   Cirrhosis of liver without ascites (HCC) 05/08/2023   Acute on chronic diastolic CHF (congestive heart failure) (HCC) 05/08/2023   Symptomatic anemia 05/06/2023   GI bleed 11/24/2022   Arteriovenous malformation (AVM) 10/21/2022   Generalized weakness 08/18/2022   AKI (acute kidney injury) (HCC) 08/18/2022   Severe sepsis (HCC) 02/04/2022   Right lower lobe pneumonia 02/03/2022   Acute on chronic respiratory failure with hypoxia (HCC) 02/03/2022   Hyperkalemia 02/03/2022   COPD (chronic obstructive pulmonary disease) (HCC) 02/03/2022   SIRS (systemic inflammatory response syndrome), possible sepsis (HCC) 02/03/2022   Chronic blood loss anemia 09/24/2021   Gastric polyp    Gastritis without bleeding    Anemia in  chronic kidney disease (CKD) 06/19/2021   Iron deficiency anemia due to chronic blood loss 06/19/2021   Gait abnormality 05/07/2021   Stage 3a chronic kidney disease (HCC) 05/20/2020   DM type 2 with diabetic peripheral neuropathy (HCC) 05/06/2020   Idiopathic peripheral neuropathy 01/15/2020   Generalized edema 01/15/2020   Primary pulmonary hypertension (HCC) 10/28/2019   Therapeutic drug monitoring 03/23/2018   Mild episode of recurrent major depressive disorder (HCC) 09/26/2017   Ventricular ectopic beats 09/26/2017   Type 2 diabetes mellitus with diabetic polyneuropathy, with long-term current use of insulin (HCC) 08/12/2017   Hyperlipidemia due to type 2 diabetes mellitus (HCC) 08/12/2017   B12 deficiency 12/14/2016   Low back pain 03/10/2016   Lumbosacral disc disease 01/01/2016   Neuropathy associated with endocrine disorder (HCC) 11/19/2015   Iron deficiency anemia 06/03/2015   Essential hypertension 06/03/2015   Hyperlipidemia 06/03/2015   Depression 06/03/2015   Gastroesophageal reflux disease without esophagitis 06/03/2015   Edema extremities 06/03/2015   Multiple sclerosis (HCC) 07/26/2013   Abnormality of gait 07/26/2013   Morbid obesity (HCC) 07/26/2013    Palliative Care Assessment & Plan   Recommendations/Plan: DNR/DNI.  Continue to treat the treatable  PMT will shadow for needs.   Code Status:    Code Status Orders  (From admission, onward)           Start     Ordered   06/27/23 0840  Do not attempt resuscitation (DNR)- Limited -Do Not Intubate (DNI)  (Code Status)  Continuous       Question Answer Comment  If pulseless and not breathing No CPR or chest compressions.   In Pre-Arrest Conditions (Patient Is Breathing and Has A Pulse) Do not intubate. Provide all appropriate  non-invasive medical interventions. Avoid ICU transfer unless indicated or required.   Consent: Discussion documented in EHR or advanced directives reviewed      06/27/23 0839            Code Status History     Date Active Date Inactive Code Status Order ID Comments User Context   06/21/2023 1847 06/27/2023 0839 Full Code [id number]  Lorretta Harp, MD ED   05/06/2023 1248 05/20/2023 1852 Full Code [id number]  Cox, Nadyne Coombes, DO ED   11/24/2022 1614 11/26/2022 1629 Full Code [id number]  Emeline General, MD ED   10/16/2022 1846 10/17/2022 2119 Full Code [id number]  Verdene Lennert, MD ED   08/18/2022 1433 08/19/2022 1912 DNR [id number]  Lucile Shutters, MD ED   02/03/2022 2207 02/05/2022 1725 Full Code [id number]  Andris Baumann, MD ED   09/24/2021 1432 09/27/2021 1951 Full Code [id number]  Lucile Shutters, MD ED       Thank you for allowing the Palliative Medicine Team to assist in the care of this patient.   Morton Stall, NP  Please contact Palliative Medicine Team phone at [phone] for questions and concerns.

## 2023-06-28 NOTE — Progress Notes (Signed)
PHARMACY CONSULT NOTE  Pharmacy Consult for Electrolyte Monitoring and Replacement   Recent Labs: Potassium (mmol/L)  Date Value  06/28/2023 3.2 (L)  08/31/2013 3.5   Magnesium (mg/dL)  Date Value  54/06/8118 1.9   Calcium (mg/dL)  Date Value  14/78/2956 8.1 (L)   Calcium, Total (mg/dL)  Date Value  21/30/8657 8.9   Albumin (g/dL)  Date Value  84/69/6295 2.7 (L)  02/15/2022 3.6 (L)  08/31/2013 2.9 (L)   Phosphorus (mg/dL)  Date Value  28/41/3244 3.2  08/28/2013 4.9   Sodium (mmol/L)  Date Value  06/28/2023 144  02/15/2022 138  08/31/2013 143     Assessment:  72 y.o. male admitted on 06/21/2023 with pneumonia. PMH significant for HTN, HLD, DM, chronic respiratory failure / COPD on 4L O2, diastolic CHF, GIB, depression, MS (wheel-chair bound), pulmonary HTN, chronic pain, liver cirrhosis, obesity, anemia. CT chest revealed features compatible with multifocal pneumonia.   Diuretics: furosemide infusion at 4 mg/hr  Goal of Therapy:  Electrolytes WNL  Plan:  ---40 mEq po KCL x 2 ---recheck electrolytes in am  Lowella Bandy ,PharmD Clinical Pharmacist 06/28/2023 8:27 AM

## 2023-06-29 ENCOUNTER — Ambulatory Visit: Payer: Medicare Other | Admitting: Urology

## 2023-06-29 ENCOUNTER — Encounter: Payer: Medicare Other | Admitting: Family

## 2023-06-29 DIAGNOSIS — R339 Retention of urine, unspecified: Secondary | ICD-10-CM

## 2023-06-29 DIAGNOSIS — I272 Pulmonary hypertension, unspecified: Secondary | ICD-10-CM | POA: Diagnosis not present

## 2023-06-29 DIAGNOSIS — I5033 Acute on chronic diastolic (congestive) heart failure: Secondary | ICD-10-CM | POA: Diagnosis not present

## 2023-06-29 DIAGNOSIS — J9621 Acute and chronic respiratory failure with hypoxia: Secondary | ICD-10-CM | POA: Diagnosis not present

## 2023-06-29 DIAGNOSIS — E871 Hypo-osmolality and hyponatremia: Secondary | ICD-10-CM

## 2023-06-29 DIAGNOSIS — Z7189 Other specified counseling: Secondary | ICD-10-CM | POA: Diagnosis not present

## 2023-06-29 DIAGNOSIS — J441 Chronic obstructive pulmonary disease with (acute) exacerbation: Secondary | ICD-10-CM | POA: Diagnosis not present

## 2023-06-29 LAB — BASIC METABOLIC PANEL
Anion gap: 12 (ref 5–15)
BUN: 39 mg/dL — ABNORMAL HIGH (ref 8–23)
CO2: 29 mmol/L (ref 22–32)
Calcium: 7.9 mg/dL — ABNORMAL LOW (ref 8.9–10.3)
Chloride: 103 mmol/L (ref 98–111)
Creatinine, Ser: 1.33 mg/dL — ABNORMAL HIGH (ref 0.61–1.24)
GFR, Estimated: 57 mL/min — ABNORMAL LOW (ref >60)
Glucose, Bld: 85 mg/dL (ref 70–99)
Potassium: 3.4 mmol/L — ABNORMAL LOW (ref 3.5–5.1)
Sodium: 144 mmol/L (ref 135–145)

## 2023-06-29 LAB — GLUCOSE, CAPILLARY
Glucose-Capillary: 82 mg/dL (ref 70–99)
Glucose-Capillary: 77 mg/dL (ref 70–99)
Glucose-Capillary: 116 mg/dL — ABNORMAL HIGH (ref 70–99)
Glucose-Capillary: 135 mg/dL — ABNORMAL HIGH (ref 70–99)
Glucose-Capillary: 146 mg/dL — ABNORMAL HIGH (ref 70–99)

## 2023-06-29 LAB — CBC
HCT: 27.1 % — ABNORMAL LOW (ref 39.0–52.0)
Hemoglobin: 8.4 g/dL — ABNORMAL LOW (ref 13.0–17.0)
MCH: 27.4 pg (ref 26.0–34.0)
MCHC: 31 g/dL (ref 30.0–36.0)
MCV: 88.3 fL (ref 80.0–100.0)
Platelets: 232 10*3/uL (ref 150–400)
RBC: 3.07 MIL/uL — ABNORMAL LOW (ref 4.22–5.81)
RDW: 17.9 % — ABNORMAL HIGH (ref 11.5–15.5)
WBC: 5.7 10*3/uL (ref 4.0–10.5)
nRBC: 0 % (ref 0.0–0.2)

## 2023-06-29 LAB — MAGNESIUM: Magnesium: 1.6 mg/dL — ABNORMAL LOW (ref 1.7–2.4)

## 2023-06-29 LAB — PHOSPHORUS: Phosphorus: 3.2 mg/dL (ref 2.5–4.6)

## 2023-06-29 MED ORDER — POTASSIUM CHLORIDE CRYS ER 20 MEQ PO TBCR
40.0000 meq | EXTENDED_RELEASE_TABLET | Freq: Once | ORAL | Status: AC
  Administered 2023-06-29: 40 meq via ORAL
  Filled 2023-06-29: qty 2

## 2023-06-29 MED ORDER — CHLORHEXIDINE GLUCONATE CLOTH 2 % EX PADS: 6.0000 | MEDICATED_PAD | Freq: Every day | CUTANEOUS | Status: DC

## 2023-06-29 MED ORDER — MAGNESIUM SULFATE 2 GM/50ML IV SOLN
2.0000 g | Freq: Once | INTRAVENOUS | Status: AC
  Administered 2023-06-29: 2 g via INTRAVENOUS
  Filled 2023-06-29: qty 50

## 2023-06-29 NOTE — Progress Notes (Signed)
[facility]  ROUNDING NOTE   Subjective:   Jeremy [name] is a 72 year old male with past medical conditions including hyperlipidemia, diabetes, hypertension, respiratory failure, COPD with 4 L nasal cannula at baseline, depression, diastolic heart failure, multiple sclerosis, liver cirrhosis, anemia, pulmonary hypertension, and chronic kidney disease stage IIIa.  Patient presents to the emergency department complaining of shortness of breath and has been admitted for Acute on chronic respiratory failure with hypoxia [facility]) [J96.21]  Patient is known to our practice from outpatient follow-up.  Patient was last seen in office on 07/01/2022 for routine follow-up.    Patient was transferred to ICU due to concern of chest pain. Remains on high flow oxygen by nasal cannula. Generalized edema is slowly improving  Creatinine is noted to be slightly higher today.. Urine output  3700 cc  IV furosemide drip at 4 mg/h He underwent CT angiogram to rule out PE and was started on IV Unasyn and IV doxycycline.  Objective:  Vital signs in last 24 hours:  Temp:  [97.9 F (36.6 C)-98.7 F (37.1 C)] 97.9 F (36.6 C) (09/04 0834) Pulse Rate:  [62-79] 70 (09/04 1453) Resp:  [13-26] 20 (09/04 1453) BP: (105-152)/(44-72) 135/63 (09/04 1300) SpO2:  [88 %-99 %] 94 % (09/04 1453) Weight:  [97.8 kg] 97.8 kg (09/04 0500)  Weight change: 0.8 kg Filed Weights   06/27/23 0520 06/28/23 0500 06/29/23 0500  Weight: 96.5 kg 97 kg 97.8 kg    Intake/Output: I/O last 3 completed shifts: In: 2504 [P.O.:960; I.V.:114.7; IV Piggyback:1429.3] Out: 5250 [Urine:5250]   Intake/Output this shift:  Total I/O In: 926.2 [P.O.:720; I.V.:12; IV Piggyback:194.2] Out: 1175 [Urine:1175]  Physical Exam: General: NAD  Head: Normocephalic, atraumatic. Moist oral mucosal membranes  Eyes: Anicteric  Lungs:  Basilar wheeze, 4 L [place]  Heart: Regular rate and rhythm  Abdomen:  Soft, nontender, nondistended   Extremities: + dependent peripheral edema.  Neurologic: Alert and oriented, able to follow simple commands  Skin: No lesions  GU Foley catheter    Basic Metabolic Panel: Recent Labs  Lab 06/25/23 0539 06/26/23 0516 06/27/23 0308 06/28/23 0441 06/29/23 0335  NA 135 136 140 144 144  K 3.3* 3.1* 3.4* 3.2* 3.4*  CL 100 102 102 106 103  CO2 24 26 28 30 29   GLUCOSE 115* 83 111* 94 85  BUN 57* 52* 46* 41* 39*  CREATININE 1.44* 1.26* 1.25* 1.16 1.33*  CALCIUM 7.8* 7.8* 8.1* 8.1* 7.9*  MG 1.9 1.6* 1.9 1.9 1.6*  PHOS 2.7 2.9 2.5 3.2 3.2    Liver Function Tests: Recent Labs  Lab 06/24/23 0432 06/25/23 0539 06/26/23 0516 06/27/23 0308  AST 21 27 18 21   ALT 12 12 12 13   ALKPHOS 61 57 58 66  BILITOT 0.8 0.9 1.0 1.3*  PROT 5.5* 5.1* 5.1* 6.2*  ALBUMIN 1.9* 2.1* 2.3* 2.7*   No results for input(s): "LIPASE", "AMYLASE" in the last 168 hours. No results for input(s): "AMMONIA" in the last 168 hours.   CBC: Recent Labs  Lab 06/25/23 0539 06/25/23 1334 06/26/23 0516 06/26/23 1651 06/27/23 0308 06/28/23 0441 06/28/23 1603 06/29/23 0335  WBC 7.2  --  6.3  --  7.2 6.2  --  5.7  HGB 8.0*   < > 8.2* 10.4* 9.1* 8.5* 9.6* 8.4*  HCT 24.0*   < > 24.5* 32.5* 29.1* 26.6* 30.5* 27.1*  MCV 83.9  --  84.2  --  87.4 88.1  --  88.3  PLT 283  --  266  --  272 238  --  232   < > = values in this interval not displayed.    Cardiac Enzymes: No results for input(s): "CKTOTAL", "CKMB", "CKMBINDEX", "TROPONINI" in the last 168 hours.  BNP: Invalid input(s): "POCBNP"  CBG: Recent Labs  Lab 06/28/23 1620 06/28/23 2125 06/29/23 0600 06/29/23 0718 06/29/23 1118  GLUCAP 154* 131* 82 77 116*    Microbiology: Results for orders placed or performed during the hospital encounter of 06/21/23  Culture, blood (Routine x 2)     Status: None   Collection Time: 06/21/23  3:02 PM   Specimen: BLOOD  Result Value Ref Range Status   Specimen Description BLOOD LEFT ANTECUBITAL  Final    Special Requests   Final    BOTTLES DRAWN AEROBIC AND ANAEROBIC Blood Culture adequate volume   Culture   Final    NO GROWTH 5 DAYS Performed at [facility], [address], [place], [place] [address]    Report Status 06/26/2023 FINAL  Final  Culture, blood (Routine x 2)     Status: None   Collection Time: 06/21/23  3:13 PM   Specimen: BLOOD  Result Value Ref Range Status   Specimen Description BLOOD RIGHT ANTECUBITAL  Final   Special Requests   Final    BOTTLES DRAWN AEROBIC AND ANAEROBIC Blood Culture adequate volume   Culture   Final    NO GROWTH 5 DAYS Performed at [facility], [address] [place], [place] [address]    Report Status 06/26/2023 FINAL  Final  Resp panel by RT-PCR (RSV, Flu A&B, Covid) Anterior Nasal Swab     Status: None   Collection Time: 06/21/23  4:35 PM   Specimen: Anterior Nasal Swab  Result Value Ref Range Status   SARS Coronavirus 2 by RT PCR NEGATIVE NEGATIVE Final    Comment: (NOTE) SARS-CoV-2 target nucleic acids are NOT DETECTED.  The SARS-CoV-2 RNA is generally detectable in upper respiratory specimens during the acute phase of infection. The lowest concentration of SARS-CoV-2 viral copies this assay can detect is 138 copies/mL. A negative result does not preclude SARS-Cov-2 infection and should not be used as the sole basis for treatment or other patient management decisions. A negative result may occur with  improper specimen collection/handling, submission of specimen other than nasopharyngeal swab, presence of viral mutation(s) within the areas targeted by this assay, and inadequate number of viral copies(<138 copies/mL). A negative result must be combined with clinical observations, patient history, and epidemiological information. The expected result is Negative.  Fact Sheet for Patients:  BloggerCourse.com  Fact Sheet for Healthcare Providers:   SeriousBroker.it  This test is no t yet approved or cleared by the [place] FDA and  has been authorized for detection and/or diagnosis of SARS-CoV-2 by FDA under an Emergency Use Authorization (EUA). This EUA will remain  in effect (meaning this test can be used) for the duration of the COVID-19 declaration under Section 564(b)(1) of the Act, 21 U.S.C.section 360bbb-3(b)(1), unless the authorization is terminated  or revoked sooner.       Influenza A by PCR NEGATIVE NEGATIVE Final   Influenza B by PCR NEGATIVE NEGATIVE Final    Comment: (NOTE) The Xpert Xpress SARS-CoV-2/FLU/RSV plus assay is intended as an aid in the diagnosis of influenza from Nasopharyngeal swab specimens and should not be used as a sole basis for treatment. Nasal washings and aspirates are unacceptable for Xpert Xpress SARS-CoV-2/FLU/RSV testing.  Fact Sheet for Patients: BloggerCourse.com  Fact Sheet  for Healthcare Providers: SeriousBroker.it  This test is not yet approved or cleared by the [place] and has been authorized for detection and/or diagnosis of SARS-CoV-2 by FDA under an Emergency Use Authorization (EUA). This EUA will remain in effect (meaning this test can be used) for the duration of the COVID-19 declaration under Section 564(b)(1) of the Act, 21 U.S.C. section 360bbb-3(b)(1), unless the authorization is terminated or revoked.     Resp Syncytial Virus by PCR NEGATIVE NEGATIVE Final    Comment: (NOTE) Fact Sheet for Patients: BloggerCourse.com  Fact Sheet for Healthcare Providers: SeriousBroker.it  This test is not yet approved or cleared by the [place] FDA and has been authorized for detection and/or diagnosis of SARS-CoV-2 by FDA under an Emergency Use Authorization (EUA). This EUA will remain in effect (meaning this test can be used) for  the duration of the COVID-19 declaration under Section 564(b)(1) of the Act, 21 U.S.C. section 360bbb-3(b)(1), unless the authorization is terminated or revoked.  Performed at [facility], [address] [place], [place] [address]   MRSA Next Gen by PCR, Nasal     Status: Abnormal   Collection Time: 06/21/23  8:14 PM   Specimen: Nasal Mucosa; Nasal Swab  Result Value Ref Range Status   MRSA by PCR Next Gen DETECTED (A) NOT DETECTED Final    Comment: RESULT CALLED TO, READ BACK BY AND VERIFIED WITH: KRISTY SNEEAD AT 2206 ON 06/21/23 BY SS (NOTE) The GeneXpert MRSA Assay (FDA approved for NASAL specimens only), is one component of a comprehensive MRSA colonization surveillance program. It is not intended to diagnose MRSA infection nor to guide or monitor treatment for MRSA infections. Test performance is not FDA approved in patients less than 59 years old. Performed at [facility], [address] [place], [place] [address]   Respiratory (~20 pathogens) panel by PCR     Status: None   Collection Time: 06/22/23  3:22 PM   Specimen: Nasopharyngeal Swab; Respiratory  Result Value Ref Range Status   Adenovirus NOT DETECTED NOT DETECTED Final   Coronavirus 229E NOT DETECTED NOT DETECTED Final    Comment: (NOTE) The Coronavirus on the Respiratory Panel, DOES NOT test for the novel  Coronavirus (2019 nCoV)    Coronavirus HKU1 NOT DETECTED NOT DETECTED Final   Coronavirus NL63 NOT DETECTED NOT DETECTED Final   Coronavirus OC43 NOT DETECTED NOT DETECTED Final   Metapneumovirus NOT DETECTED NOT DETECTED Final   Rhinovirus / Enterovirus NOT DETECTED NOT DETECTED Final   Influenza A NOT DETECTED NOT DETECTED Final   Influenza B NOT DETECTED NOT DETECTED Final   Parainfluenza Virus 1 NOT DETECTED NOT DETECTED Final   Parainfluenza Virus 2 NOT DETECTED NOT DETECTED Final   Parainfluenza Virus 3 NOT DETECTED NOT DETECTED Final   Parainfluenza Virus 4 NOT DETECTED NOT  DETECTED Final   Respiratory Syncytial Virus NOT DETECTED NOT DETECTED Final   Bordetella pertussis NOT DETECTED NOT DETECTED Final   Bordetella Parapertussis NOT DETECTED NOT DETECTED Final   Chlamydophila pneumoniae NOT DETECTED NOT DETECTED Final   Mycoplasma pneumoniae NOT DETECTED NOT DETECTED Final    Comment: Performed at [facility], [address], [place], [place] [address]  Body fluid culture w Gram Stain     Status: None   Collection Time: 06/24/23 12:12 PM   Specimen: PATH Cytology Peritoneal fluid  Result Value Ref Range Status   Specimen Description   Final    PERITONEAL Performed at  [facility], [address] [place], [place] [address]    Special Requests   Final    NONE Performed at [facility], [address] [place], [place] [address]    Gram Stain NO WBC SEEN NO ORGANISMS SEEN   Final   Culture   Final    NO GROWTH 3 DAYS Performed at [facility], [address], [place], [place] [address]    Report Status 06/27/2023 FINAL  Final  MRSA Next Gen by PCR, Nasal     Status: Abnormal   Collection Time: 06/26/23  5:43 PM   Specimen: Nasal Mucosa; Nasal Swab  Result Value Ref Range Status   MRSA by PCR Next Gen DETECTED (A) NOT DETECTED Final    Comment: RESULT CALLED TO, READ BACK BY AND VERIFIED WITH: [facility] WHITE 06/26/2023 2055 CP (NOTE) The GeneXpert MRSA Assay (FDA approved for NASAL specimens only), is one component of a comprehensive MRSA colonization surveillance program. It is not intended to diagnose MRSA infection nor to guide or monitor treatment for MRSA infections. Test performance is not FDA approved in patients less than 20 years old. Performed at [facility], [address] [place], [place] [address]   Body fluid culture w Gram Stain     Status: None (Preliminary result)   Collection Time: 06/28/23 12:25 PM   Specimen: PATH Cytology Pleural fluid  Result Value Ref Range Status   Specimen  Description   Final    PLEURAL Performed at [facility], [address] [place], [place] [address]    Special Requests   Final    NONE Performed at [facility], [address] [place], [place] [address]    Gram Stain NO WBC SEEN NO ORGANISMS SEEN   Final   Culture   Final    NO GROWTH < 12 HOURS Performed at [facility], [address], [place], [place] [address]    Report Status PENDING  Incomplete    Coagulation Studies: Recent Labs    06/27/23 0308  LABPROT 19.5*  INR 1.6*    Urinalysis: No results for input(s): "COLORURINE", "LABSPEC", "PHURINE", "GLUCOSEU", "HGBUR", "BILIRUBINUR", "KETONESUR", "PROTEINUR", "UROBILINOGEN", "NITRITE", "LEUKOCYTESUR" in the last 72 hours.  Invalid input(s): "APPERANCEUR"    Imaging: ECHOCARDIOGRAM COMPLETE  Result Date: 06/28/2023    ECHOCARDIOGRAM REPORT   Patient Name:   RUDRANSH ANNEN Date of Exam: 06/28/2023 Medical Rec #:  [id number]     Height:       70.0 in Accession #:    [id number]    Weight:       213.8 lb Date of Birth:  10/10/1951     BSA:          2.148 m Patient Age:    72 years      BP:           128/46 mmHg Patient Gender: M             HR:           72 bpm. Exam Location:  [facility] Procedure: 2D Echo, Color Doppler and Cardiac Doppler Indications:     CHF-acute diastolic I50.31  History:         Patient has prior history of Echocardiogram examinations, most                  recent 05/09/2023. Risk Factors:Diabetes and Hypertension.  Sonographer:     Cristela Blue Referring Phys:  [id number] Lelon Frohlich  [id number] Diagnosing Phys: Yvonne Kendall MD IMPRESSIONS  1. Left ventricular ejection fraction, by estimation, is 55 to 60%. The left ventricle has normal function. The left ventricle has no regional wall motion abnormalities. There is mild asymmetric left ventricular hypertrophy of the basal-septal segment. Left ventricular diastolic parameters were normal.  2. Right ventricular systolic function is normal. The right  ventricular size is normal. Tricuspid regurgitation signal is inadequate for assessing PA pressure.  3. The mitral valve is normal in structure. Mild mitral valve regurgitation. No evidence of mitral stenosis.  4. The aortic valve has an indeterminant number of cusps. Aortic valve regurgitation is not visualized. No aortic stenosis is present. FINDINGS  Left Ventricle: Left ventricular ejection fraction, by estimation, is 55 to 60%. The left ventricle has normal function. The left ventricle has no regional wall motion abnormalities. The left ventricular internal cavity size was normal in size. There is  mild asymmetric left ventricular hypertrophy of the basal-septal segment. Left ventricular diastolic parameters were normal. Right Ventricle: The right ventricular size is normal. No increase in right ventricular wall thickness. Right ventricular systolic function is normal. Tricuspid regurgitation signal is inadequate for assessing PA pressure. Left Atrium: Left atrial size was normal in size. Right Atrium: Right atrial size was normal in size. Pericardium: The pericardium was not well visualized. Mitral Valve: The mitral valve is normal in structure. Mild mitral valve regurgitation. No evidence of mitral valve stenosis. Tricuspid Valve: The tricuspid valve is not well visualized. Tricuspid valve regurgitation is trivial. Aortic Valve: The aortic valve has an indeterminant number of cusps. Aortic valve regurgitation is not visualized. No aortic stenosis is present. Aortic valve mean gradient measures 4.0 mmHg. Aortic valve peak gradient measures 7.3 mmHg. Aortic valve area, by VTI measures 2.26 cm. Pulmonic Valve: The pulmonic valve was not well visualized. Pulmonic valve regurgitation is not visualized. No evidence of pulmonic stenosis. Aorta: The aortic root is normal in size and structure. Pulmonary Artery: The pulmonary artery is not well seen. Venous: The inferior vena cava was not well visualized. IAS/Shunts:  The interatrial septum was not well visualized.  LEFT VENTRICLE PLAX 2D LVIDd:         5.20 cm   Diastology LVIDs:         3.60 cm   LV e' medial:    9.79 cm/s LV PW:         0.90 cm   LV E/e' medial:  11.5 LV IVS:        1.34 cm   LV e' lateral:   13.10 cm/s LVOT diam:     2.00 cm   LV E/e' lateral: 8.6 LV SV:         74 LV SV Index:   35 LVOT Area:     3.14 cm  RIGHT VENTRICLE RV Basal diam:  4.00 cm RV Mid diam:    3.30 cm RV S prime:     12.80 cm/s TAPSE (M-mode): 2.4 cm LEFT ATRIUM           Index        RIGHT ATRIUM           Index LA Vol (A2C): 46.2 ml 21.51 ml/m  RA Area:     13.90 cm LA Vol (A4C): 35.8 ml 16.67 ml/m  RA Volume:   34.20 ml  15.93 ml/m  AORTIC VALVE AV Area (Vmax):    2.18 cm AV Area (Vmean):   1.94 cm AV Area (VTI):  2.26 cm AV Vmax:           134.67 cm/s AV Vmean:          92.867 cm/s AV VTI:            0.329 m AV Peak Grad:      7.3 mmHg AV Mean Grad:      4.0 mmHg LVOT Vmax:         93.40 cm/s LVOT Vmean:        57.200 cm/s LVOT VTI:          0.236 m LVOT/AV VTI ratio: 0.72  AORTA Ao Root diam: 3.40 cm MITRAL VALVE MV Area (PHT): 3.77 cm     SHUNTS MV Decel Time: 201 msec     Systemic VTI:  0.24 m MV E velocity: 113.00 cm/s  Systemic Diam: 2.00 cm MV A velocity: 84.40 cm/s MV E/A ratio:  1.34 Jeremy Deer End MD Electronically signed by Yvonne Kendall MD Signature Date/Time: 06/28/2023/6:01:25 PM    Final    US THORACENTESIS ASP PLEURAL SPACE W/IMG GUIDE  Result Date: 06/28/2023 INDICATION: 72 year old male. History of positive D-dimer, pneumonia and right-sided pleural effusion. Request is for therapeutic and diagnostic right-sided thoracentesis EXAM: ULTRASOUND GUIDED THERAPEUTIC AND DIAGNOSTIC RIGHT-SIDED THORACENTESIS MEDICATIONS: Lidocaine 1% 10 mL COMPLICATIONS: None immediate. PROCEDURE: An ultrasound guided thoracentesis was thoroughly discussed with the patient and questions answered. The benefits, risks, alternatives and complications were also discussed. The  patient understands and wishes to proceed with the procedure. Written consent was obtained. Ultrasound was performed to localize and mark an adequate pocket of fluid in the right chest. The area was then prepped and draped in the normal sterile fashion. 1% Lidocaine was used for local anesthesia. Under ultrasound guidance a 6 Fr Safe-T-Centesis catheter was introduced. Thoracentesis was performed. The catheter was removed and a dressing applied. FINDINGS: A total of approximately 400 mL of serosanguineous fluid was removed. Samples were sent to the laboratory as requested by the clinical team. Ultrasound showed right-sided pleural effusion to be loculated. IMPRESSION: Successful ultrasound guided right-sided therapeutic and diagnostic thoracentesis yielding 400 mL of pleural fluid. Performed by: Jeremy Grant, NP Electronically Signed   By: Gilmer Mor D.O.   On: 06/28/2023 13:20   DG Chest Port 1 View  Result Date: 06/28/2023 CLINICAL DATA:  72 year old male with a history thoracentesis EXAM: PORTABLE CHEST 1 VIEW COMPARISON:  06/27/2023 FINDINGS: Cardiomediastinal silhouette unchanged in size and contour. Similar appearance of mixed interstitial and airspace opacities of the bilateral lungs, with improved aeration at the right lung base. Minimal blunting at the right costophrenic angle, significantly improved from the prior. Obscuration of the left hemidiaphragm persists. No pneumothorax. IMPRESSION: Improved right-sided pleural fluid status post thoracentesis, with no significant residual and no complicating features. Similar appearance of multifocal interstitial and airspace disease of the bilateral lungs, and persisting opacity at the left lung base. Electronically Signed   By: Gilmer Mor D.O.   On: 06/28/2023 13:16     Medications:    sodium chloride Stopped (06/27/23 2352)   ampicillin-sulbactam (UNASYN) IV Stopped (06/29/23 1030)   doxycycline (VIBRAMYCIN) IV Stopped (06/29/23 0505)    furosemide (LASIX) 200 mg in dextrose 5 % 100 mL (2 mg/mL) infusion 4 mg/hr (06/29/23 1300)    vitamin C  500 mg Oral Daily   Chlorhexidine Gluconate Cloth  6 each Topical Daily   fluticasone  1 spray Each Nare Daily   folic acid  1 mg Oral Daily   gemfibrozil  600 mg Oral BID AC   guaiFENesin  600 mg Oral BID   insulin aspart  0-5 Units Subcutaneous QHS   insulin aspart  0-9 Units Subcutaneous TID WC   insulin glargine-yfgn  7 Units Subcutaneous QHS   Interferon Beta-1b  0.25 mg Subcutaneous QODAY   iron polysaccharides  150 mg Oral Daily   loratadine  10 mg Oral Daily   mupirocin ointment  1 Application Nasal BID   nystatin   Topical TID   mouth rinse  15 mL Mouth Rinse 4 times per day   pantoprazole  40 mg Oral BID   pregabalin  200 mg Oral BID   sertraline  25 mg Oral Daily   simvastatin  40 mg Oral q1800   tamsulosin  0.4 mg Oral QPC supper   Vitamin D (Ergocalciferol)  50,000 Units Oral Q7 days   sodium chloride, acetaminophen, albuterol, guaiFENesin-dextromethorphan, hydrALAZINE, ondansetron (ZOFRAN) IV, mouth rinse, traMADol  Assessment/ Plan:  Mr. Arrie Starbird is a 72 y.o.  male with past medical conditions including hyperlipidemia, diabetes, hypertension, respiratory failure, COPD with 4 L nasal cannula at baseline, depression, diastolic heart failure, multiple sclerosis, liver cirrhosis, anemia, pulmonary hypertension, and chronic kidney disease stage IIIa.  Patient presents to the emergency department complaining of shortness of breath and has been admitted for Acute on chronic respiratory failure with hypoxia (HCC) [J96.21]   Chronic kidney disease stage 2,. Chronic kidney disease is secondary to diabetes, hypertension, advanced age - adequate response to diuretic therapy.  Continue diuresis at prescribed rate. Patient underwent CT angiogram on 06/27/2023.  Will monitor the effect of IV contrast exposure on renal function.  Noted to be higher today.  Lab Results   Component Value Date   CREATININE 1.33 (H) 06/29/2023   CREATININE 1.16 06/28/2023   CREATININE 1.25 (H) 06/27/2023    Intake/Output Summary (Last 24 hours) at 06/29/2023 1457 Last data filed at 06/29/2023 1428 Gross per 24 hour  Intake 2055.32 ml  Output 3375 ml  Net -1319.68 ml   2.  Volume overload, appears multifactorial with contributing factors of COPD   .  CT angiogram from 06/26/2023-large right pleural effusion. -Continues to have dependent edema over thighs.  Continued on IV furosemide at 4 mg/h with good response. Right thoracentesis done on 06/28/2023.  400 cc fluid removed.  3. Anemia of chronic kidney disease Lab Results  Component Value Date   HGB 8.4 (L) 06/29/2023    Hemoglobin improved with 1 unit blood transfusion this admission on 06/23/2023.  4.  Diabetes mellitus type II with chronic kidney disease/renal manifestations: insulin dependent. Home regimen includes Lantus. Most recent hemoglobin A1c is 4.4 on 11/24/22.     LOS: 8 Jamileth Putzier 9/4/20242:57 PM

## 2023-06-29 NOTE — Plan of Care (Signed)
  Problem: Coping: Goal: Ability to adjust to condition or change in health will improve Outcome: Progressing   Problem: Fluid Volume: Goal: Ability to maintain a balanced intake and output will improve Outcome: Progressing   Problem: Education: Goal: Knowledge of disease or condition will improve Outcome: Progressing   Problem: Respiratory: Goal: Ability to maintain a clear airway will improve Outcome: Progressing Goal: Levels of oxygenation will improve Outcome: Progressing   Problem: Cardiac: Goal: Ability to achieve and maintain adequate cardiopulmonary perfusion will improve Outcome: Progressing

## 2023-06-29 NOTE — Progress Notes (Signed)
PHARMACY CONSULT NOTE  Pharmacy Consult for Electrolyte Monitoring and Replacement   Recent Labs: Potassium (mmol/L)  Date Value  06/29/2023 3.4 (L)  08/31/2013 3.5   Magnesium (mg/dL)  Date Value  60/45/4098 1.6 (L)   Calcium (mg/dL)  Date Value  11/91/4782 7.9 (L)   Calcium, Total (mg/dL)  Date Value  95/62/1308 8.9   Albumin (g/dL)  Date Value  65/78/4696 2.7 (L)  02/15/2022 3.6 (L)  08/31/2013 2.9 (L)   Phosphorus (mg/dL)  Date Value  29/52/8413 3.2  08/28/2013 4.9   Sodium (mmol/L)  Date Value  06/29/2023 144  02/15/2022 138  08/31/2013 143     Assessment:  72 y.o. male admitted on 06/21/2023 with pneumonia. PMH significant for HTN, HLD, DM, chronic respiratory failure / COPD on 4L O2, diastolic CHF, GIB, depression, MS (wheel-chair bound), pulmonary HTN, chronic pain, liver cirrhosis, obesity, anemia. CT chest revealed features compatible with multifocal pneumonia.   Diuretics: furosemide infusion at 4 mg/hr  Goal of Therapy:  Electrolytes WNL  Plan:  ---40 mEq po KCL x 1 ---2 grams IV magnesium sulfate x 1 ---recheck electrolytes in am  Lowella Bandy ,PharmD Clinical Pharmacist 06/29/2023 7:04 AM

## 2023-06-29 NOTE — Plan of Care (Signed)
  Problem: Coping: Goal: Ability to adjust to condition or change in health will improve Outcome: Progressing   Problem: Fluid Volume: Goal: Ability to maintain a balanced intake and output will improve Outcome: Progressing   Problem: Metabolic: Goal: Ability to maintain appropriate glucose levels will improve Outcome: Progressing   Problem: Nutritional: Goal: Maintenance of adequate nutrition will improve Outcome: Progressing Goal: Progress toward achieving an optimal weight will improve Outcome: Progressing   Problem: Tissue Perfusion: Goal: Adequacy of tissue perfusion will improve Outcome: Not Progressing   Problem: Activity: Goal: Capacity to carry out activities will improve Outcome: Not Progressing   Problem: Education: Goal: Knowledge of disease or condition will improve Outcome: Not Progressing   Problem: Activity: Goal: Capacity to carry out activities will improve Outcome: Not Progressing

## 2023-06-29 NOTE — Progress Notes (Signed)
Daily Progress Note   Patient Name: Jeremy [name]       Date: 06/29/2023 DOB: [birth year]  Age: 72 y.o. MRN#: [id number] Attending Physician: Marcelino Duster, MD Primary Care Physician: Duanne Limerick, MD Admit Date: 06/21/2023  Reason for Consultation/Follow-up: Establishing goals of care  Subjective: Notes and labs reviewed.  In to see patient.  He is currently resting in bed, no family at bedside.  He has no complaints or questions at this time.  Goals set for DNR/DNI, and continue to treat the treatable otherwise.  Length of Stay: 8  Current Medications: Scheduled Meds:   vitamin C  500 mg Oral Daily   Chlorhexidine Gluconate Cloth  6 each Topical Daily   fluticasone  1 spray Each Nare Daily   folic acid  1 mg Oral Daily   gemfibrozil  600 mg Oral BID AC   guaiFENesin  600 mg Oral BID   insulin aspart  0-5 Units Subcutaneous QHS   insulin aspart  0-9 Units Subcutaneous TID WC   insulin glargine-yfgn  7 Units Subcutaneous QHS   Interferon Beta-1b  0.25 mg Subcutaneous QODAY   iron polysaccharides  150 mg Oral Daily   loratadine  10 mg Oral Daily   mupirocin ointment  1 Application Nasal BID   nystatin   Topical TID   mouth rinse  15 mL Mouth Rinse 4 times per day   pantoprazole  40 mg Oral BID   pregabalin  200 mg Oral BID   sertraline  25 mg Oral Daily   simvastatin  40 mg Oral q1800   tamsulosin  0.4 mg Oral QPC supper   Vitamin D (Ergocalciferol)  50,000 Units Oral Q7 days    Continuous Infusions:  sodium chloride Stopped (06/27/23 2352)   ampicillin-sulbactam (UNASYN) IV Stopped (06/29/23 1030)   doxycycline (VIBRAMYCIN) IV Stopped (06/29/23 0505)   furosemide (LASIX) 200 mg in dextrose 5 % 100 mL (2 mg/mL) infusion 4 mg/hr (06/29/23 1300)    PRN  Meds: sodium chloride, acetaminophen, albuterol, guaiFENesin-dextromethorphan, hydrALAZINE, ondansetron (ZOFRAN) IV, mouth rinse, traMADol  Physical Exam Pulmonary:     Effort: Pulmonary effort is normal.     Comments: Oxygen via nasal cannula Neurological:     Mental Status: He is alert.  Vital Signs: BP 135/63   Pulse 75   Temp 97.9 F (36.6 C) (Oral)   Resp (!) 22   Ht 5\' 10"  (1.778 m)   Wt 97.8 kg   SpO2 96%   BMI 30.94 kg/m  SpO2: SpO2: 96 % O2 Device: O2 Device: High Flow Nasal Cannula O2 Flow Rate: O2 Flow Rate (L/min): 8 L/min  Intake/output summary:  Intake/Output Summary (Last 24 hours) at 06/29/2023 1345 Last data filed at 06/29/2023 1300 Gross per 24 hour  Intake 2055.32 ml  Output 2600 ml  Net -544.68 ml   LBM: Last BM Date : 06/28/23 Baseline Weight: Weight: 102.4 kg Most recent weight: Weight: 97.8 kg    Patient Active Problem List   Diagnosis Date Noted   COPD exacerbation (HCC) 06/21/2023   Obesity (BMI 30-39.9) 06/21/2023   Hyponatremia 06/21/2023   Pleural effusion 06/21/2023   Urinary retention 06/21/2023   Myocardial injury 05/11/2023   Moderate pulmonary hypertension (HCC) 05/09/2023   Cirrhosis of liver without ascites (HCC) 05/08/2023   Acute on chronic diastolic CHF (congestive heart failure) (HCC) 05/08/2023   Symptomatic anemia 05/06/2023   GI bleed 11/24/2022   Arteriovenous malformation (AVM) 10/21/2022   Generalized weakness 08/18/2022   AKI (acute kidney injury) (HCC) 08/18/2022   Severe sepsis (HCC) 02/04/2022   Right lower lobe pneumonia 02/03/2022   Acute on chronic respiratory failure with hypoxia (HCC) 02/03/2022   Hyperkalemia 02/03/2022   COPD (chronic obstructive pulmonary disease) (HCC) 02/03/2022   SIRS (systemic inflammatory response syndrome), possible sepsis (HCC) 02/03/2022   Chronic blood loss anemia 09/24/2021   Gastric polyp    Gastritis without bleeding    Anemia in chronic kidney disease (CKD)  06/19/2021   Iron deficiency anemia due to chronic blood loss 06/19/2021   Gait abnormality 05/07/2021   Stage 3a chronic kidney disease (HCC) 05/20/2020   DM type 2 with diabetic peripheral neuropathy (HCC) 05/06/2020   Idiopathic peripheral neuropathy 01/15/2020   Generalized edema 01/15/2020   Primary pulmonary hypertension (HCC) 10/28/2019   Therapeutic drug monitoring 03/23/2018   Mild episode of recurrent major depressive disorder (HCC) 09/26/2017   Ventricular ectopic beats 09/26/2017   Type 2 diabetes mellitus with diabetic polyneuropathy, with long-term current use of insulin (HCC) 08/12/2017   Hyperlipidemia due to type 2 diabetes mellitus (HCC) 08/12/2017   B12 deficiency 12/14/2016   Low back pain 03/10/2016   Lumbosacral disc disease 01/01/2016   Neuropathy associated with endocrine disorder (HCC) 11/19/2015   Iron deficiency anemia 06/03/2015   Essential hypertension 06/03/2015   Hyperlipidemia 06/03/2015   Depression 06/03/2015   Gastroesophageal reflux disease without esophagitis 06/03/2015   Edema extremities 06/03/2015   Multiple sclerosis (HCC) 07/26/2013   Abnormality of gait 07/26/2013   Morbid obesity (HCC) 07/26/2013    Palliative Care Assessment & Plan     Recommendations/Plan: Goals set for DNR/DNI, continue to treat the treatable. PMT will shadow  Code Status:    Code Status Orders  (From admission, onward)           Start     Ordered   06/27/23 0840  Do not attempt resuscitation (DNR)- Limited -Do Not Intubate (DNI)  (Code Status)  Continuous       Question Answer Comment  If pulseless and not breathing No CPR or chest compressions.   In Pre-Arrest Conditions (Patient Is Breathing and Has A Pulse) Do not intubate. Provide all appropriate non-invasive medical interventions. Avoid ICU transfer unless indicated or required.   Consent: Discussion  documented in EHR or advanced directives reviewed      06/27/23 0839           Code  Status History     Date Active Date Inactive Code Status Order ID Comments User Context   06/21/2023 1847 06/27/2023 0839 Full Code [id number]  Lorretta Harp, MD ED   05/06/2023 1248 05/20/2023 1852 Full Code [id number]  CoxNadyne Coombes, DO ED   11/24/2022 1614 11/26/2022 1629 Full Code [id number]  Emeline General, MD ED   10/16/2022 1846 10/17/2022 2119 Full Code [id number]  Verdene Lennert, MD ED   08/18/2022 1433 08/19/2022 1912 DNR [id number]  Lucile Shutters, MD ED   02/03/2022 2207 02/05/2022 1725 Full Code [id number]  Andris Baumann, MD ED   09/24/2021 1432 09/27/2021 1951 Full Code [id number]  Lucile Shutters, MD ED       Thank you for allowing the Palliative Medicine Team to assist in the care of this patient.   Morton Stall, NP  Please contact Palliative Medicine Team phone at [phone] for questions and concerns.

## 2023-06-29 NOTE — Progress Notes (Signed)
Attempted to call 2A to give report. Nurse receiving patient currently admitting other new patient. Was asked that I call back in 30 min.

## 2023-06-29 NOTE — Progress Notes (Signed)
Foley removed 0830. Patient voiding with external catheter. 775 mls so far.   Post void bladder scan performed 1430 showing 575. MD Sreeram aware. Verbal order for in and out cath X1.   I&O performed with only 225 mls of clear yellow urine obtained. Post I&O bladder scan showing 600. MD made aware of scan and potential inaccuracy. Patient in no discomfort. External catheter replaced.

## 2023-06-29 NOTE — Progress Notes (Signed)
Progress Note   Patient: Jeremy [name] ZOX:[id number] DOB: 02/03/1951 DOA: 06/21/2023     8 DOS: the patient was seen and examined on 06/29/2023   Brief hospital course: Jeremy [name] is a 72 y.o. male with medical history significant of HTN, HLD, DM, chronic respiratory failure, COPD on 4L O2, dCHF, GIB, depression, MS (wheelchair-bound), pulmonary hypertension, chronic pain, liver cirrhosis, obesity, anemia, who presents with SOB.   Patient was recently hospitalized from 7/12 - 7/26 due to CHF exacerbation and right lobar pneumonia. Patient usually on 4 L supplemental oxygen is currently requiring 8 L.  Patient has been having worsening shortness of breath and leg swelling with 13lb  weight gain.  Patient was started on IV Lasix, IV Solu-Medrol and nebulizer therapy along with IV antibiotics.   Assessment and Plan: Acute on chronic hypoxic respiratory failure most likely due to CHF exacerbation, Multifocal pneumonia  Patient uses 4 L oxygen at baseline currently on 8 L.  Continue to wean oxygen as tolerated. CT chest: Bilateral lung collapse, multifocal pneumonia, bilateral effusion R >L 9/1 started Unasyn, pharmacy consulted and doxycycline 100 mg IV twice daily for aspiration PNA and to cover MRSA as well 9/2 patient's respiratory status got worse on 9/1, moved to stepdown unit, PCCM on board. Encourage incentive spirometry Will consider repeat thoracentesis. Continue IV Lasix for diuresis, albumin as below.   Diastolic CHF exacerbation and anasarca with significant lower extremity edema Patient presented with shortness of breath and LE edema S/p Lasix 40 mg IV twice daily Started Lasix IV infusion 8/30--8/31 s/p Albumin 12.5 g IV Q6 hourly for 2 days 9/2 albumin 12.5 g IV bid x 4 doses ordered after discussion with nephrologist. Net negative 1319 ml. 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  As per patient's wife patient cannot have more procedures done due to respiratory failure and he may not wake up.  Patient and his wife would prefer conservative management.  8/30 Hb 6.9, 1 unit PRBC transfused. 9/4 Hb 8.5 stable.  Stage 3a chronic kidney disease  Recent baseline 0.90 1.2-1.4.   creatinine 1.33 stable on current diuresis Monitor renal function and urine output S/p IV albumin as above Nephrology on board, follow further recommendations.     # COPD exacerbation, most likely due to volume overload S/p Solu-Medrol 125 mg given in the ED,  s/p Solu-Medrol 40 mg IV BID, DC'd on 8/29  S/p  doxycycline 100 mg p.o. twice daily d/c'd on 8/30 Started Mucinex 600 mg p.o. twice daily Continue Robitussin DM as needed   Pleural effusion Moderate right pleural effusion most likely due to CHF, liver cirrhosis with hypoalbuminemia. Continue diuresis as above 8/30 s/p thoracentesis 1.2 L fluid was tapped by IR, postprocedure x-ray did not show any complications. Fluid studies, as per lights criteria, LDH elevated, consistent with exudative but clinically no signs of infection. Started Abx due to CT scan findings of multifocal pneumonia, bilateral effusion, right is moderate. 9/3 s/p repeat right thoracentesis, 400 mL fluid was removed.   Type 2 diabetes mellitus- Continue Semglee 7 units nightly Accucheks with sliding scale insulin. Continue diabetic diet Hypoglycemia protocol.  Essential hypertension IV hydralazine as needed Continue IV Lasix   Hyperlipidemia Zocor and lopid   Multiple sclerosis (HCC) pt is on Betaseron injection Fall precautions   Myocardial  injury:  troponin level 21 --> -20.   No chest pain Continue Zocor   Folic acid deficiency, folate level 5.2 Daily folate supplement.   Hypokalemia-  secondary to diuresis, potassium repleted. Monitor electrolytes daily  Hypomagnesemia- Replete as needed and recheck.  Hypotonic hyponatremia: Resolved Serum osmolality 265 low Na 120.  Mental status normal. Sodium chloride 2 g p.o. 3 times daily for 5 days Continue fluid restriction 1200 ml/d Na 120---132 --144 Check BMP daily now    Cirrhosis of liver without ascites [facility]): Mental status normal.  INR 1.3 Ammonia level 26 wnl INR 1.3, PT 16.7, PTT 42, slightly elevated   Chronic urinary retention: Foley removed today. He is at high risk of retention. Bladder scan per nursing protocol. Continue Flomax If persistent retention, replace foley with outpatient urology eval.                                                                                                                                                                                                                                                                                                                  Depression Continue home medication, Zoloft   Vitamin D deficiency: started vitamin D 50,000 units p.o. weekly, follow with PCP to repeat vitamin D level after 3 to 6 months.   Obesity Body mass index is 30.68 kg/m.  Diet, exercise and weight reduction advised.   Diet: Heart healthy/carb modified, fluid striction 1200 mL/day DVT Prophylaxis: Subcutaneous Lovenox        Subjective: Patient is seen and examined today morning.  He is lying comfortably.  Patient is on 8 L supplemental oxygen which decreased to 6 L.  Advised out of bed to chair with PT, encouraged incentive spirometry.  Wife at bedside.  Denies any other complaints.  Physical Exam: Vitals:   06/29/23 0900 06/29/23 1000 06/29/23  1100 06/29/23 1200  BP: (!) 136/53 128/62 125/67 (!) 132/44   Pulse: 77 72 72 72  Resp: 19 20 16 18   Temp:      TempSrc:      SpO2: (!) 88% 94% 92% 93%  Weight:      Height:       General - Elderly obese Caucasian ill looking male, mild respiratory distress HEENT - PERRLA, EOMI, atraumatic head, non tender sinuses. Lung -diffuse rales, rhonchi. Heart - S1, S2 heard, no murmurs, rubs, 1+ pedal edema Neuro - Alert, awake and oriented x 3, non focal exam. Skin - Warm and dry. Data Reviewed:     Latest Ref Rng & Units 06/29/2023    3:35 AM 06/28/2023    4:03 PM 06/28/2023    4:41 AM  CBC  WBC 4.0 - 10.5 K/uL 5.7   6.2   Hemoglobin 13.0 - 17.0 g/dL 8.4  9.6  8.5   Hematocrit 39.0 - 52.0 % 27.1  30.5  26.6   Platelets 150 - 400 K/uL 232   238        Latest Ref Rng & Units 06/29/2023    3:35 AM 06/28/2023    4:41 AM 06/27/2023    3:08 AM  BMP  Glucose 70 - 99 mg/dL 85  94  161   BUN 8 - 23 mg/dL 39  41  46   Creatinine 0.61 - 1.24 mg/dL 0.96  0.45  4.09   Sodium 135 - 145 mmol/L 144  144  140   Potassium 3.5 - 5.1 mmol/L 3.4  3.2  3.4   Chloride 98 - 111 mmol/L 103  106  102   CO2 22 - 32 mmol/L 29  30  28    Calcium 8.9 - 10.3 mg/dL 7.9  8.1  8.1    ECHOCARDIOGRAM COMPLETE  Result Date: 06/28/2023    ECHOCARDIOGRAM REPORT   Patient Name:   NICOLAUS BYER Date of Exam: 06/28/2023 Medical Rec #:  [id number]     Height:       70.0 in Accession #:    [id number]    Weight:       213.8 lb Date of Birth:  [birth year]     BSA:          2.148 m Patient Age:    72 years      BP:           128/46 mmHg Patient Gender: M             HR:           72 bpm. Exam Location:  [facility] Procedure: 2D Echo, Color Doppler and Cardiac Doppler Indications:     CHF-acute diastolic I50.31  History:         Patient has prior history of Echocardiogram examinations, most                  recent 05/09/2023. Risk Factors:Diabetes and Hypertension.  Sonographer:     Cristela Blue Referring Phys:  [id number] Gillis Santa Diagnosing Phys: Yvonne Kendall MD IMPRESSIONS  1. Left ventricular ejection  fraction, by estimation, is 55 to 60%. The left ventricle has normal function. The left ventricle has no regional wall motion abnormalities. There is mild asymmetric left ventricular hypertrophy of the basal-septal segment. Left ventricular diastolic parameters were normal.  2. Right ventricular systolic function is normal. The right ventricular size is normal. Tricuspid regurgitation signal is inadequate for assessing PA pressure.  3. The mitral  valve is normal in structure. Mild mitral valve regurgitation. No evidence of mitral stenosis.  4. The aortic valve has an indeterminant number of cusps. Aortic valve regurgitation is not visualized. No aortic stenosis is present. FINDINGS  Left Ventricle: Left ventricular ejection fraction, by estimation, is 55 to 60%. The left ventricle has normal function. The left ventricle has no regional wall motion abnormalities. The left ventricular internal cavity size was normal in size. There is  mild asymmetric left ventricular hypertrophy of the basal-septal segment. Left ventricular diastolic parameters were normal. Right Ventricle: The right ventricular size is normal. No increase in right ventricular wall thickness. Right ventricular systolic function is normal. Tricuspid regurgitation signal is inadequate for assessing PA pressure. Left Atrium: Left atrial size was normal in size. Right Atrium: Right atrial size was normal in size. Pericardium: The pericardium was not well visualized. Mitral Valve: The mitral valve is normal in structure. Mild mitral valve regurgitation. No evidence of mitral valve stenosis. Tricuspid Valve: The tricuspid valve is not well visualized. Tricuspid valve regurgitation is trivial. Aortic Valve: The aortic valve has an indeterminant number of cusps. Aortic valve regurgitation is not visualized. No aortic stenosis is present. Aortic valve mean gradient measures 4.0 mmHg. Aortic valve peak gradient measures 7.3 mmHg. Aortic valve area, by VTI  measures 2.26 cm. Pulmonic Valve: The pulmonic valve was not well visualized. Pulmonic valve regurgitation is not visualized. No evidence of pulmonic stenosis. Aorta: The aortic root is normal in size and structure. Pulmonary Artery: The pulmonary artery is not well seen. Venous: The inferior vena cava was not well visualized. IAS/Shunts: The interatrial septum was not well visualized.  LEFT VENTRICLE PLAX 2D LVIDd:         5.20 cm   Diastology LVIDs:         3.60 cm   LV e' medial:    9.79 cm/s LV PW:         0.90 cm   LV E/e' medial:  11.5 LV IVS:        1.34 cm   LV e' lateral:   13.10 cm/s LVOT diam:     2.00 cm   LV E/e' lateral: 8.6 LV SV:         74 LV SV Index:   35 LVOT Area:     3.14 cm  RIGHT VENTRICLE RV Basal diam:  4.00 cm RV Mid diam:    3.30 cm RV S prime:     12.80 cm/s TAPSE (M-mode): 2.4 cm LEFT ATRIUM           Index        RIGHT ATRIUM           Index LA Vol (A2C): 46.2 ml 21.51 ml/m  RA Area:     13.90 cm LA Vol (A4C): 35.8 ml 16.67 ml/m  RA Volume:   34.20 ml  15.93 ml/m  AORTIC VALVE AV Area (Vmax):    2.18 cm AV Area (Vmean):   1.94 cm AV Area (VTI):     2.26 cm AV Vmax:           134.67 cm/s AV Vmean:          92.867 cm/s AV VTI:            0.329 m AV Peak Grad:      7.3 mmHg AV Mean Grad:      4.0 mmHg LVOT Vmax:         93.40 cm/s LVOT Vmean:  57.200 cm/s LVOT VTI:          0.236 m LVOT/AV VTI ratio: 0.72  AORTA Ao Root diam: 3.40 cm MITRAL VALVE MV Area (PHT): 3.77 cm     SHUNTS MV Decel Time: 201 msec     Systemic VTI:  0.24 m MV E velocity: 113.00 cm/s  Systemic Diam: 2.00 cm MV A velocity: 84.40 cm/s MV E/A ratio:  1.34 Yvonne Kendall MD Electronically signed by Yvonne Kendall MD Signature Date/Time: 06/28/2023/6:01:25 PM    Final    US THORACENTESIS ASP PLEURAL SPACE W/IMG GUIDE  Result Date: 06/28/2023 INDICATION: 72 year old male. History of positive D-dimer, pneumonia and right-sided pleural effusion. Request is for therapeutic and diagnostic right-sided  thoracentesis EXAM: ULTRASOUND GUIDED THERAPEUTIC AND DIAGNOSTIC RIGHT-SIDED THORACENTESIS MEDICATIONS: Lidocaine 1% 10 mL COMPLICATIONS: None immediate. PROCEDURE: An ultrasound guided thoracentesis was thoroughly discussed with the patient and questions answered. The benefits, risks, alternatives and complications were also discussed. The patient understands and wishes to proceed with the procedure. Written consent was obtained. Ultrasound was performed to localize and mark an adequate pocket of fluid in the right chest. The area was then prepped and draped in the normal sterile fashion. 1% Lidocaine was used for local anesthesia. Under ultrasound guidance a 6 Fr Safe-T-Centesis catheter was introduced. Thoracentesis was performed. The catheter was removed and a dressing applied. FINDINGS: A total of approximately 400 mL of serosanguineous fluid was removed. Samples were sent to the laboratory as requested by the clinical team. Ultrasound showed right-sided pleural effusion to be loculated. IMPRESSION: Successful ultrasound guided right-sided therapeutic and diagnostic thoracentesis yielding 400 mL of pleural fluid. Performed by: Anders Grant, NP Electronically Signed   By: Gilmer Mor D.O.   On: 06/28/2023 13:20   DG Chest Port 1 View  Result Date: 06/28/2023 CLINICAL DATA:  72 year old male with a history thoracentesis EXAM: PORTABLE CHEST 1 VIEW COMPARISON:  06/27/2023 FINDINGS: Cardiomediastinal silhouette unchanged in size and contour. Similar appearance of mixed interstitial and airspace opacities of the bilateral lungs, with improved aeration at the right lung base. Minimal blunting at the right costophrenic angle, significantly improved from the prior. Obscuration of the left hemidiaphragm persists. No pneumothorax. IMPRESSION: Improved right-sided pleural fluid status post thoracentesis, with no significant residual and no complicating features. Similar appearance of multifocal interstitial and  airspace disease of the bilateral lungs, and persisting opacity at the left lung base. Electronically Signed   By: Gilmer Mor D.O.   On: 06/28/2023 13:16    Family Communication: Wife at bedside, updated regarding current care plan  Disposition: Status is: Inpatient Remains inpatient appropriate because: IV Lasix gtt., close monitoring of respiratory status  Planned Discharge Destination: Home with Home Health    MDM level 3-He is admitted with acute on chronic hypoxic respiratory failure due to CHF exacerbation, pleural effusion, pneumonia.  He also has hyponatremia, anasarca. Patient is currently on IV Lasix infusion, needs close monitoring of hemodynamics, respiratory status, telemetry, electrolytes.  He is at high risk for clinical deterioration  Author: Marcelino Duster, MD 06/29/2023 1:02 PM  For on call review [email or web address].

## 2023-06-30 DIAGNOSIS — J9621 Acute and chronic respiratory failure with hypoxia: Secondary | ICD-10-CM | POA: Diagnosis not present

## 2023-06-30 DIAGNOSIS — I5033 Acute on chronic diastolic (congestive) heart failure: Secondary | ICD-10-CM | POA: Diagnosis not present

## 2023-06-30 DIAGNOSIS — J441 Chronic obstructive pulmonary disease with (acute) exacerbation: Secondary | ICD-10-CM | POA: Diagnosis not present

## 2023-06-30 DIAGNOSIS — I272 Pulmonary hypertension, unspecified: Secondary | ICD-10-CM | POA: Diagnosis not present

## 2023-06-30 LAB — CBC
HCT: 26.6 % — ABNORMAL LOW (ref 39.0–52.0)
Hemoglobin: 8.4 g/dL — ABNORMAL LOW (ref 13.0–17.0)
MCH: 27.3 pg (ref 26.0–34.0)
MCHC: 31.6 g/dL (ref 30.0–36.0)
MCV: 86.4 fL (ref 80.0–100.0)
Platelets: 204 10*3/uL (ref 150–400)
RBC: 3.08 MIL/uL — ABNORMAL LOW (ref 4.22–5.81)
RDW: 17.8 % — ABNORMAL HIGH (ref 11.5–15.5)
WBC: 5.1 10*3/uL (ref 4.0–10.5)
nRBC: 0.4 % — ABNORMAL HIGH (ref 0.0–0.2)

## 2023-06-30 LAB — BASIC METABOLIC PANEL
Anion gap: 11 (ref 5–15)
BUN: 40 mg/dL — ABNORMAL HIGH (ref 8–23)
CO2: 29 mmol/L (ref 22–32)
Calcium: 7.9 mg/dL — ABNORMAL LOW (ref 8.9–10.3)
Chloride: 102 mmol/L (ref 98–111)
Creatinine, Ser: 1.23 mg/dL (ref 0.61–1.24)
GFR, Estimated: >60 mL/min (ref >60)
Glucose, Bld: 91 mg/dL (ref 70–99)
Potassium: 3.6 mmol/L (ref 3.5–5.1)
Sodium: 142 mmol/L (ref 135–145)

## 2023-06-30 LAB — MAGNESIUM: Magnesium: 2.1 mg/dL (ref 1.7–2.4)

## 2023-06-30 LAB — GLUCOSE, CAPILLARY
Glucose-Capillary: 83 mg/dL (ref 70–99)
Glucose-Capillary: 79 mg/dL (ref 70–99)
Glucose-Capillary: 136 mg/dL — ABNORMAL HIGH (ref 70–99)
Glucose-Capillary: 121 mg/dL — ABNORMAL HIGH (ref 70–99)
Glucose-Capillary: 135 mg/dL — ABNORMAL HIGH (ref 70–99)

## 2023-06-30 LAB — PHOSPHORUS: Phosphorus: 3.3 mg/dL (ref 2.5–4.6)

## 2023-06-30 MED ORDER — POTASSIUM CHLORIDE CRYS ER 20 MEQ PO TBCR
40.0000 meq | EXTENDED_RELEASE_TABLET | Freq: Once | ORAL | Status: AC
  Administered 2023-06-30: 40 meq via ORAL
  Filled 2023-06-30: qty 2

## 2023-06-30 MED ORDER — FUROSEMIDE 20 MG PO TABS
20.0000 mg | ORAL_TABLET | Freq: Every day | ORAL | Status: DC
  Administered 2023-06-30 – 2023-07-05 (×6): 20 mg via ORAL
  Filled 2023-06-30 (×6): qty 1

## 2023-06-30 NOTE — Consult Note (Signed)
PHARMACY CONSULT NOTE - ELECTROLYTES  Pharmacy Consult for Electrolyte Monitoring and Replacement   Recent Labs: Height: 5\' 10"  (177.8 cm) Weight: 97.8 kg (215 lb 9.8 oz) IBW/kg (Calculated) : 73 Estimated Creatinine Clearance: 64.6 mL/min (by C-G formula based on SCr of 1.23 mg/dL). Potassium (mmol/L)  Date Value  06/30/2023 3.6  08/31/2013 3.5   Magnesium (mg/dL)  Date Value  08/21/2535 2.1   Calcium (mg/dL)  Date Value  64/40/3474 7.9 (L)   Calcium, Total (mg/dL)  Date Value  25/95/6387 8.9   Albumin (g/dL)  Date Value  56/43/3295 2.7 (L)  02/15/2022 3.6 (L)  08/31/2013 2.9 (L)   Phosphorus (mg/dL)  Date Value  18/84/1660 3.3  08/28/2013 4.9   Sodium (mmol/L)  Date Value  06/30/2023 142  02/15/2022 138  08/31/2013 143   Assessment  Jeremy [name] is a 72 y.o. male presenting with pneumonia. PMH significant for HTN, HLD, DM, chronic respiratory failure / COPD on 4L O2, diastolic CHF, GIB, depression, MS (wheel-chair bound), pulmonary HTN, chronic pain, liver cirrhosis, obesity, anemia. CT chest revealed features compatible with multifocal pneumonia. Pharmacy has been consulted to monitor and replace electrolytes.  Diet: heart healthy/carb modified - aspiration precautions MIVF: n/a Pertinent medications: furosemide infusion  Goal of Therapy: Electrolytes WNL  Plan:  Electrolytes WNL with K = 3.6 after Kcl po x 1 dose given 9/4. Noted patient remains ion furosemide infusion at 4mg /hr with stable renal function and no scheduled Kcl supplement. Will order another Kcl 40 mEq po x 1 dose for today. Follow AM labs and replace as needed.  Thank you for allowing pharmacy to be a part of this patient's care.  Kamen Hanken Rodriguez-Guzman PharmD, BCPS 06/30/2023 7:33 AM

## 2023-06-30 NOTE — Consult Note (Signed)
Pharmacy Antibiotic Note  Jeremy [name] is a 72 y.o. male admitted on 06/21/2023 with pneumonia. PMH significant for HTN, HLD, DM, chronic respiratory failure / COPD on 4L O2, diastolic CHF, GIB, depression, MS (wheel-chair bound), pulmonary HTN, chronic pain, liver cirrhosis, obesity, anemia. CT chest revealed features compatible with multifocal pneumonia. Pharmacy has been consulted for Unasyn dosing.  Plan: Day 5 of antibiotics Unasyn Continue Unasyn 3 g IV Q6H pending cultures results Patient is also on doxycycline 100 mg IV Q12H Continue to monitor renal function and follow culture results   Height: 5\' 10"  (177.8 cm) Weight: 97.8 kg (215 lb 9.8 oz) IBW/kg (Calculated) : 73  Temp (24hrs), Avg:98.3 F (36.8 C), Min:97.9 F (36.6 C), Max:98.7 F (37.1 C)  Recent Labs  Lab 06/26/23 0516 06/27/23 0308 06/28/23 0441 06/29/23 0335 06/30/23 0600  WBC 6.3 7.2 6.2 5.7 5.1  CREATININE 1.26* 1.25* 1.16 1.33* 1.23    Estimated Creatinine Clearance: 64.6 mL/min (by C-G formula based on SCr of 1.23 mg/dL).    Allergies  Allergen Reactions   Betadine [Povidone Iodine]     Antimicrobials this admission: 8/27 Vancomycin x1 8/27 Azithromycin x1 8/27 Cefepime x1 8/27 Doxycycline >> 8/30 9/1 Unasyn >>  9/1 Doxycycline >>  Dose adjustments this admission: N/A  Microbiology results: 8/27 BCx: NG final 8/27 MRSA PCR: positive 8/29 Sputum: ordered  8/30 Peritoneal Cx: NG2D 9/3 Pleural fluid cx  Thank you for allowing pharmacy to be a part of this patient's care.  Caidin Heidenreich Rodriguez-Guzman PharmD, BCPS 06/30/2023 7:51 AM

## 2023-06-30 NOTE — Progress Notes (Signed)
Progress Note   Patient: Jeremy [name] ZOX:[id number] DOB: 05/18/1951 DOA: 06/21/2023     9 DOS: the patient was seen and examined on 06/30/2023   Brief hospital course: Jeremy [name] is a 72 y.o. male with medical history significant of HTN, HLD, DM, chronic respiratory failure, COPD on 4L O2, dCHF, GIB, depression, MS (wheelchair-bound), pulmonary hypertension, chronic pain, liver cirrhosis, obesity, anemia, who presents with SOB.   Patient was recently hospitalized from 7/12 - 7/26 due to CHF exacerbation and right lobar pneumonia. Patient usually on 4 L supplemental oxygen is currently requiring 8 L.  Patient has been having worsening shortness of breath and leg swelling with 13lb  weight gain.  Patient was started on IV Lasix, IV Solu-Medrol and nebulizer therapy along with IV antibiotics.   Assessment and Plan: Acute on chronic hypoxic respiratory failure most likely due to CHF exacerbation, Multifocal pneumonia  Patient uses 4 L oxygen at baseline currently on 8 L.  Continue to wean oxygen as tolerated. CT chest: Bilateral lung collapse, multifocal pneumonia, bilateral effusion R >L 9/1 started Unasyn, pharmacy consulted and doxycycline 100 mg IV twice daily for aspiration PNA and to cover MRSA as well 9/2 patient's respiratory status got worse on 9/1, moved to stepdown unit, PCCM on board. Encourage incentive spirometry Advised RN to wean O2 to 4L and maintain 90-92% saturation. Continue IV Lasix for diuresis, albumin as below.   Diastolic CHF exacerbation and anasarca with significant lower extremity edema Patient presented with shortness of breath and LE edema S/p Lasix 40 mg IV twice daily Lasix IV infusion changed to oral per nephrology. S/p albumin infusions Aim for net negative output. 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  As per patient's wife patient cannot have more procedures done due to respiratory failure and he may not wake up.  Patient and his wife would prefer conservative management.  8/30 Hb 6.9, 1 unit PRBC transfused. 9/4 Hb 8.5 stable.  Stage 3a chronic kidney disease  Recent baseline 0.90 1.2-1.4.   creatinine 1.33 stable on current diuresis Monitor renal function and urine output S/p IV albumin as above Nephrology on board, follow further recommendations.   COPD exacerbation, most likely due to volume overload s/p Solu-Medrol 40 mg IV BID, DC'd on 8/29  S/p  doxycycline 100 mg p.o. twice daily d/c'd on 8/30 Started Mucinex 600 mg p.o. twice daily Continue Robitussin DM as needed   Pleural effusion Moderate right pleural effusion most likely due to CHF, liver cirrhosis with hypoalbuminemia. Continue diuresis as above 8/30 s/p thoracentesis 1.2 L fluid was tapped by IR, postprocedure x-ray did not show any complications. Fluid studies, as per lights criteria, LDH elevated, consistent with exudative but clinically no signs of infection. Started Abx due to CT scan findings of multifocal pneumonia, bilateral effusion, right is moderate. 9/3 s/p repeat right thoracentesis, 400 mL fluid was removed.   Type 2 diabetes mellitus- Continue Semglee 7 units nightly Accucheks with sliding scale insulin. Continue diabetic diet Hypoglycemia protocol.   Essential hypertension IV hydralazine as needed Continue IV Lasix   Hyperlipidemia Zocor and lopid   Multiple sclerosis (HCC)  pt is on Betaseron injection Fall precautions   Myocardial injury:  troponin level 21 --> -20.   No chest pain Continue Zocor   Folic acid deficiency, folate level  5.2 Daily folate supplement.   Hypokalemia-  secondary to diuresis, potassium repleted. Monitor electrolytes daily  Hypomagnesemia- Replete as needed and recheck.  Hypotonic hyponatremia: Resolved Serum osmolality 265 low Mental status normal. He got Sodium chloride 2 g p.o. 3 times daily for 5 days Continue fluid restriction 1200 ml/d Na 120---132 --144 -- 142   Cirrhosis of liver without ascites (HCC): Mental status normal.  INR 1.3 Ammonia level 26 wnl INR 1.3, PT 16.7, PTT 42, slightly elevated   Chronic urinary retention: Foley removed today. He is at high risk of retention. Bladder scan per nursing protocol. Continue Flomax If persistent retention, replace foley with outpatient urology eval.                                                                                                                                                                                                                                                                                                                  Depression Continue home medication, Zoloft   Vitamin D deficiency: started vitamin D 50,000 units p.o. weekly, follow with PCP to repeat vitamin D level after 3 to 6 months.   Obesity Body mass index is 30.68 kg/m.  Diet, exercise and weight reduction advised.   Diet: Heart healthy/carb modified, fluid striction 1200 mL/day DVT Prophylaxis: Subcutaneous Lovenox        Subjective: Patient is seen and examined today morning.  He is lying comfortably.  Patient is on 8-9 L HFNC supplemental oxygen. Wife at bedside states he is eating fair.  Denies any other complaints.  Physical Exam: Vitals:   06/30/23 0804 06/30/23 1113 06/30/23 1233 06/30/23 1636  BP: (!) 124/58 (!) 146/65  (!) 147/63  Pulse: 62 68  70  Resp: 18 18  20   Temp: 97.7 F (36.5 C) 97.7 F (36.5 C)  98.5  F (36.9 C)  TempSrc: Oral Oral  Oral  SpO2: 97% 91% 97% 90%  Weight:      Height:       General -  Elderly obese Caucasian ill looking male, no respiratory distress HEENT - PERRLA, EOMI, atraumatic head, non tender sinuses. Lung -diffuse rales, rhonchi. Heart - S1, S2 heard, no murmurs, rubs, 1+ pedal edema Neuro - Alert, awake and oriented x 3, non focal exam. Skin - Warm and dry. Data Reviewed:     Latest Ref Rng & Units 06/30/2023    6:00 AM 06/29/2023    3:35 AM 06/28/2023    4:03 PM  CBC  WBC 4.0 - 10.5 K/uL 5.1  5.7    Hemoglobin 13.0 - 17.0 g/dL 8.4  8.4  9.6   Hematocrit 39.0 - 52.0 % 26.6  27.1  30.5   Platelets 150 - 400 K/uL 204  232         Latest Ref Rng & Units 06/30/2023    6:00 AM 06/29/2023    3:35 AM 06/28/2023    4:41 AM  BMP  Glucose 70 - 99 mg/dL 91  85  94   BUN 8 - 23 mg/dL 40  39  41   Creatinine 0.61 - 1.24 mg/dL 4.78  2.95  6.21   Sodium 135 - 145 mmol/L 142  144  144   Potassium 3.5 - 5.1 mmol/L 3.6  3.4  3.2   Chloride 98 - 111 mmol/L 102  103  106   CO2 22 - 32 mmol/L 29  29  30    Calcium 8.9 - 10.3 mg/dL 7.9  7.9  8.1    No results found.  Family Communication: Wife at bedside, updated regarding current care plan  Disposition: Status is: Inpatient Remains inpatient appropriate because: IV Lasix gtt transition, close monitoring of respiratory status, input and output  Planned Discharge Destination: Home with Home Health    MDM level 3-He is admitted with acute on chronic hypoxic respiratory failure due to CHF exacerbation, pleural effusion, pneumonia.  He also has hyponatremia, anasarca. Patient is currently on IV Lasix infusion, needs close monitoring of hemodynamics, respiratory status, telemetry, electrolytes.  He is at high risk for clinical deterioration  Author: Marcelino Duster, MD 06/30/2023 8:55 PM  For on call review [email or web address].

## 2023-06-30 NOTE — Progress Notes (Signed)
Physical Therapy Treatment Patient Details Name: Jeremy [name] MRN: [id number] DOB: Jun 01, 1951 Today's Date: 06/30/2023   History of Present Illness Pt is a 72 year old male presenting to ED with SOB, admitted with acute on chronic respiratory failure due to CHF and COPD exacerbation, pleural effusion     PMH significant for HTN, HLD, DM, chronic respiratory failure, COPD on 4L O2, dCHF, GIB, depression, MS (wheelchair-bound), pulmonary hypertension, chronic pain, liver cirrhosis, obesity, anemia.    PT Comments  Pt is seen by OT and PT for co-treatment. Pt is received in bed with spouse at bedside, he is agreeable to session. Pt cont to perform bed mobility with max A-total x2 with assistance increasing secondary to rapid decline of activity tolerance. Pt able to initiate bed mobility task but requires assistance to achieve task and frequent cuing to maintain steady breathing patterns. During first bout of sitting EOB, Pt reported onset of dizziness and desaturated to 85% SpO2; Pt returned to supine to rest and monitor vitals. Following 5 min rest, vitals noted at 145/59 (83), SpO2 94% with improved breathing pattern. During second attempt of seated EOB, Pt was able to perform 1x dynamic balance reaching activity but limited by poor activity tolerance. Educated Pt and spouse about possibly attempting to see Pt earlier in the morning to optimize activity tolerance-Pt and spouse verbalized understanding. Pt would benefit from cont skilled PT to address above deficits and promote optimal return to PLOF.     If plan is discharge home, recommend the following: Two people to help with walking and/or transfers;Two people to help with bathing/dressing/bathroom;Assist for transportation;Assistance with feeding;Help with stairs or ramp for entrance   Can travel by private vehicle     No  Equipment Recommendations  None recommended by PT    Recommendations for Other Services       Precautions /  Restrictions Precautions Precautions: Fall Restrictions Weight Bearing Restrictions: No     Mobility  Bed Mobility Overal bed mobility: Needs Assistance Bed Mobility: Supine to Sit, Sit to Supine     Supine to sit: Max assist, +2 for physical assistance, +2 for safety/equipment, HOB elevated, Used rails Sit to supine: Max assist, +2 for physical assistance, +2 for safety/equipment, Used rails, Total assist   General bed mobility comments: Able to initiate bed mobility and maintain hand placement on hand railing but required max A x2 to complete task; globally weak and edematous throughout all extremities; With fatigue, Pt becomes total A x2 to complete task. Pt reports onset of dizziness when sitting EOB during first attempt and sats maintained at 85% SpO2 for approx 2 minutes before improving to 94% SpO2.    Transfers                   General transfer comment: Not attempted at this time due to poor activity tolerance and safety concerns    Ambulation/Gait               General Gait Details: Not performed due to poor activity tolerance and safety concerns   Stairs             Wheelchair Mobility     Tilt Bed    Modified Rankin (Stroke Patients Only)       Balance Overall balance assessment: Needs assistance Sitting-balance support: Bilateral upper extremity supported, Feet supported Sitting balance-Leahy Scale: Poor Sitting balance - Comments: Pt able to maintain seated EOB balance CGA for approx 10 sec, and then required max A  secondary to heavy posterior lean and decrease activity tolerance Postural control: Posterior lean   Standing balance-Leahy Scale: Zero Standing balance comment: NT due to safety concerns                            Cognition Arousal: Alert Behavior During Therapy: WFL for tasks assessed/performed Overall Cognitive Status: Impaired/Different from baseline Area of Impairment: Problem solving                              Problem Solving: Slow processing General Comments: Pleasant and cooperative for session; overall better appearance        Exercises Other Exercises Other Exercises: 1x dynamic seated balance with 1 UE reach but unable to perform more reps due to increase fatigue    General Comments General comments (skin integrity, edema, etc.): Vitals assess throughout session on 8.5 L HFNC, sats range between 84%-95% SpO2; one occurence of maintaining 85% SpO2 following first attempt sitting EOB with noted increase WOB but improved following approx 2 minutes      Pertinent Vitals/Pain Pain Assessment Pain Assessment: No/denies pain    Home Living                          Prior Function            PT Goals (current goals can now be found in the care plan section) Acute Rehab PT Goals Patient Stated Goal: to go home PT Goal Formulation: With patient Time For Goal Achievement: 07/06/23 Potential to Achieve Goals: Fair Progress towards PT goals: Progressing toward goals    Frequency    Min 1X/week      PT Plan      Co-evaluation   Reason for Co-Treatment: Complexity of the patient's impairments (multi-system involvement);Necessary to address cognition/behavior during functional activity;For patient/therapist safety PT goals addressed during session: Mobility/safety with mobility OT goals addressed during session: ADL's and self-care      AM-PAC PT "6 Clicks" Mobility   Outcome Measure  Help needed turning from your back to your side while in a flat bed without using bedrails?: A Lot Help needed moving from lying on your back to sitting on the side of a flat bed without using bedrails?: A Lot Help needed moving to and from a bed to a chair (including a wheelchair)?: Total Help needed standing up from a chair using your arms (e.g., wheelchair or bedside chair)?: Total Help needed to walk in hospital room?: Total Help needed climbing 3-5 steps  with a railing? : Total 6 Click Score: 8    End of Session Equipment Utilized During Treatment: Oxygen Activity Tolerance: Patient limited by fatigue Patient left: in bed;with call bell/phone within reach;with family/visitor present;with SCD's reapplied;with bed alarm set Nurse Communication: Mobility status PT Visit Diagnosis: Unsteadiness on feet (R26.81);Muscle weakness (generalized) (M62.81);Difficulty in walking, not elsewhere classified (R26.2)     Time: 5621-3086 PT Time Calculation (min) (ACUTE ONLY): 34 min  Charges:                            Elmon Else, SPT    Keziyah Kneale 06/30/2023, 12:47 PM

## 2023-06-30 NOTE — Progress Notes (Addendum)
[facility]  ROUNDING NOTE   Subjective:   Jeremy [name] is a 72 year old male with past medical conditions including hyperlipidemia, diabetes, hypertension, respiratory failure, COPD with 4 L nasal cannula at baseline, depression, diastolic heart failure, multiple sclerosis, liver cirrhosis, anemia, pulmonary hypertension, and chronic kidney disease stage IIIa.  Patient presents to the emergency department complaining of shortness of breath and has been admitted for Acute on chronic respiratory failure with hypoxia [facility]) [J96.21]  Patient is known to our practice from outpatient follow-up.  Patient was last seen in office on 07/01/2022 for routine follow-up.    Patient seen sitting up in bed, wife at bedside Completed breakfast tray at bedside Denies shortness of breath, remains on supplemental oxygen   IV furosemide drip at 4 mg/h CT angiogram negative for PE   Objective:  Vital signs in last 24 hours:  Temp:  [97.7 F (36.5 C)-98.7 F (37.1 C)] 97.7 F (36.5 C) (09/05 1113) Pulse Rate:  [61-79] 68 (09/05 1113) Resp:  [18-24] 18 (09/05 1113) BP: (124-146)/(44-65) 146/65 (09/05 1113) SpO2:  [90 %-98 %] 91 % (09/05 1113)  Weight change:  Filed Weights   06/27/23 0520 06/28/23 0500 06/29/23 0500  Weight: 96.5 kg 97 kg 97.8 kg    Intake/Output: I/O last 3 completed shifts: In: 2290.4 [P.O.:1320; I.V.:49.3; IV Piggyback:921.1] Out: 3350 [Urine:3350]   Intake/Output this shift:  Total I/O In: -  Out: 600 [Urine:600]  Physical Exam: General: NAD  Head: Normocephalic, atraumatic. Moist oral mucosal membranes  Eyes: Anicteric  Lungs:  Fine crackles, [place]  Heart: Regular rate and rhythm  Abdomen:  Soft, nontender, nondistended  Extremities: + dependent peripheral edema.  Neurologic: Alert and oriented, able to follow simple commands  Skin: No lesions       Basic Metabolic Panel: Recent Labs  Lab 06/26/23 0516 06/27/23 0308 06/28/23 0441 06/29/23 0335  06/30/23 0600  NA 136 140 144 144 142  K 3.1* 3.4* 3.2* 3.4* 3.6  CL 102 102 106 103 102  CO2 26 28 30 29 29   GLUCOSE 83 111* 94 85 91  BUN 52* 46* 41* 39* 40*  CREATININE 1.26* 1.25* 1.16 1.33* 1.23  CALCIUM 7.8* 8.1* 8.1* 7.9* 7.9*  MG 1.6* 1.9 1.9 1.6* 2.1  PHOS 2.9 2.5 3.2 3.2 3.3    Liver Function Tests: Recent Labs  Lab 06/24/23 0432 06/25/23 0539 06/26/23 0516 06/27/23 0308  AST 21 27 18 21   ALT 12 12 12 13   ALKPHOS 61 57 58 66  BILITOT 0.8 0.9 1.0 1.3*  PROT 5.5* 5.1* 5.1* 6.2*  ALBUMIN 1.9* 2.1* 2.3* 2.7*   No results for input(s): "LIPASE", "AMYLASE" in the last 168 hours. No results for input(s): "AMMONIA" in the last 168 hours.   CBC: Recent Labs  Lab 06/26/23 0516 06/26/23 1651 06/27/23 0308 06/28/23 0441 06/28/23 1603 06/29/23 0335 06/30/23 0600  WBC 6.3  --  7.2 6.2  --  5.7 5.1  HGB 8.2*   < > 9.1* 8.5* 9.6* 8.4* 8.4*  HCT 24.5*   < > 29.1* 26.6* 30.5* 27.1* 26.6*  MCV 84.2  --  87.4 88.1  --  88.3 86.4  PLT 266  --  272 238  --  232 204   < > = values in this interval not displayed.    Cardiac Enzymes: No results for input(s): "CKTOTAL", "CKMB", "CKMBINDEX", "TROPONINI" in the last 168 hours.  BNP: Invalid input(s): "POCBNP"  CBG: Recent Labs  Lab 06/29/23 1118 06/29/23 1633 06/29/23 1954  06/30/23 0419 06/30/23 0806  GLUCAP 116* 135* 146* 83 79    Microbiology: Results for orders placed or performed during the hospital encounter of 06/21/23  Culture, blood (Routine x 2)     Status: None   Collection Time: 06/21/23  3:02 PM   Specimen: BLOOD  Result Value Ref Range Status   Specimen Description BLOOD LEFT ANTECUBITAL  Final   Special Requests   Final    BOTTLES DRAWN AEROBIC AND ANAEROBIC Blood Culture adequate volume   Culture   Final    NO GROWTH 5 DAYS Performed at [facility], [address] [place], [place] [address]    Report Status 06/26/2023 FINAL  Final  Culture, blood (Routine x 2)     Status: None    Collection Time: 06/21/23  3:13 PM   Specimen: BLOOD  Result Value Ref Range Status   Specimen Description BLOOD RIGHT ANTECUBITAL  Final   Special Requests   Final    BOTTLES DRAWN AEROBIC AND ANAEROBIC Blood Culture adequate volume   Culture   Final    NO GROWTH 5 DAYS Performed at [facility], [address] [place], [place] [address]    Report Status 06/26/2023 FINAL  Final  Resp panel by RT-PCR (RSV, Flu A&B, Covid) Anterior Nasal Swab     Status: None   Collection Time: 06/21/23  4:35 PM   Specimen: Anterior Nasal Swab  Result Value Ref Range Status   SARS Coronavirus 2 by RT PCR NEGATIVE NEGATIVE Final    Comment: (NOTE) SARS-CoV-2 target nucleic acids are NOT DETECTED.  The SARS-CoV-2 RNA is generally detectable in upper respiratory specimens during the acute phase of infection. The lowest concentration of SARS-CoV-2 viral copies this assay can detect is 138 copies/mL. A negative result does not preclude SARS-Cov-2 infection and should not be used as the sole basis for treatment or other patient management decisions. A negative result may occur with  improper specimen collection/handling, submission of specimen other than nasopharyngeal swab, presence of viral mutation(s) within the areas targeted by this assay, and inadequate number of viral copies(<138 copies/mL). A negative result must be combined with clinical observations, patient history, and epidemiological information. The expected result is Negative.  Fact Sheet for Patients:  BloggerCourse.com  Fact Sheet for Healthcare Providers:  SeriousBroker.it  This test is no t yet approved or cleared by the [place] FDA and  has been authorized for detection and/or diagnosis of SARS-CoV-2 by FDA under an Emergency Use Authorization (EUA). This EUA will remain  in effect (meaning this test can be used) for the duration of the COVID-19 declaration  under Section 564(b)(1) of the Act, 21 U.S.C.section 360bbb-3(b)(1), unless the authorization is terminated  or revoked sooner.       Influenza A by PCR NEGATIVE NEGATIVE Final   Influenza B by PCR NEGATIVE NEGATIVE Final    Comment: (NOTE) The Xpert Xpress SARS-CoV-2/FLU/RSV plus assay is intended as an aid in the diagnosis of influenza from Nasopharyngeal swab specimens and should not be used as a sole basis for treatment. Nasal washings and aspirates are unacceptable for Xpert Xpress SARS-CoV-2/FLU/RSV testing.  Fact Sheet for Patients: BloggerCourse.com  Fact Sheet for Healthcare Providers: SeriousBroker.it  This test is not yet approved or cleared by the [place] FDA and has been authorized for detection and/or diagnosis of SARS-CoV-2 by FDA under an Emergency Use Authorization (EUA). This EUA will remain in effect (meaning this test can be used) for the duration of  the COVID-19 declaration under Section 564(b)(1) of the Act, 21 U.S.C. section 360bbb-3(b)(1), unless the authorization is terminated or revoked.     Resp Syncytial Virus by PCR NEGATIVE NEGATIVE Final    Comment: (NOTE) Fact Sheet for Patients: BloggerCourse.com  Fact Sheet for Healthcare Providers: SeriousBroker.it  This test is not yet approved or cleared by the [place] FDA and has been authorized for detection and/or diagnosis of SARS-CoV-2 by FDA under an Emergency Use Authorization (EUA). This EUA will remain in effect (meaning this test can be used) for the duration of the COVID-19 declaration under Section 564(b)(1) of the Act, 21 U.S.C. section 360bbb-3(b)(1), unless the authorization is terminated or revoked.  Performed at [facility], [address] [place], [place] [address]   MRSA Next Gen by PCR, Nasal     Status: Abnormal   Collection Time: 06/21/23  8:14 PM    Specimen: Nasal Mucosa; Nasal Swab  Result Value Ref Range Status   MRSA by PCR Next Gen DETECTED (A) NOT DETECTED Final    Comment: RESULT CALLED TO, READ BACK BY AND VERIFIED WITH: KRISTY SNEEAD AT 2206 ON 06/21/23 BY SS (NOTE) The GeneXpert MRSA Assay (FDA approved for NASAL specimens only), is one component of a comprehensive MRSA colonization surveillance program. It is not intended to diagnose MRSA infection nor to guide or monitor treatment for MRSA infections. Test performance is not FDA approved in patients less than 4 years old. Performed at [facility], [address] [place], [place] [address]   Respiratory (~20 pathogens) panel by PCR     Status: None   Collection Time: 06/22/23  3:22 PM   Specimen: Nasopharyngeal Swab; Respiratory  Result Value Ref Range Status   Adenovirus NOT DETECTED NOT DETECTED Final   Coronavirus 229E NOT DETECTED NOT DETECTED Final    Comment: (NOTE) The Coronavirus on the Respiratory Panel, DOES NOT test for the novel  Coronavirus (2019 nCoV)    Coronavirus HKU1 NOT DETECTED NOT DETECTED Final   Coronavirus NL63 NOT DETECTED NOT DETECTED Final   Coronavirus OC43 NOT DETECTED NOT DETECTED Final   Metapneumovirus NOT DETECTED NOT DETECTED Final   Rhinovirus / Enterovirus NOT DETECTED NOT DETECTED Final   Influenza A NOT DETECTED NOT DETECTED Final   Influenza B NOT DETECTED NOT DETECTED Final   Parainfluenza Virus 1 NOT DETECTED NOT DETECTED Final   Parainfluenza Virus 2 NOT DETECTED NOT DETECTED Final   Parainfluenza Virus 3 NOT DETECTED NOT DETECTED Final   Parainfluenza Virus 4 NOT DETECTED NOT DETECTED Final   Respiratory Syncytial Virus NOT DETECTED NOT DETECTED Final   Bordetella pertussis NOT DETECTED NOT DETECTED Final   Bordetella Parapertussis NOT DETECTED NOT DETECTED Final   Chlamydophila pneumoniae NOT DETECTED NOT DETECTED Final   Mycoplasma pneumoniae NOT DETECTED NOT DETECTED Final    Comment: Performed at [facility], [address] [place], [place] [address]  Body fluid culture w Gram Stain     Status: None   Collection Time: 06/24/23 12:12 PM   Specimen: PATH Cytology Peritoneal fluid  Result Value Ref Range Status   Specimen Description   Final    PERITONEAL Performed at [facility], [address] [place], [place] [address]    Special Requests   Final    NONE Performed at [facility], [address] [place], [place] [address]    Gram Stain NO WBC SEEN NO ORGANISMS SEEN   Final   Culture  Final    NO GROWTH 3 DAYS Performed at [facility], [address], [place], [place] [address]    Report Status 06/27/2023 FINAL  Final  MRSA Next Gen by PCR, Nasal     Status: Abnormal   Collection Time: 06/26/23  5:43 PM   Specimen: Nasal Mucosa; Nasal Swab  Result Value Ref Range Status   MRSA by PCR Next Gen DETECTED (A) NOT DETECTED Final    Comment: RESULT CALLED TO, READ BACK BY AND VERIFIED WITH: [facility] WHITE 06/26/2023 2055 CP (NOTE) The GeneXpert MRSA Assay (FDA approved for NASAL specimens only), is one component of a comprehensive MRSA colonization surveillance program. It is not intended to diagnose MRSA infection nor to guide or monitor treatment for MRSA infections. Test performance is not FDA approved in patients less than 22 years old. Performed at [facility], [address] [place], [place] [address]   Body fluid culture w Gram Stain     Status: None (Preliminary result)   Collection Time: 06/28/23 12:25 PM   Specimen: PATH Cytology Pleural fluid  Result Value Ref Range Status   Specimen Description   Final    PLEURAL Performed at [facility], [address] [place], [place] [address]    Special Requests   Final    NONE Performed at [facility], [address] [place], [place] [address]    Gram Stain NO WBC SEEN NO ORGANISMS SEEN   Final   Culture   Final    NO GROWTH 2 DAYS Performed at  [facility], [address], [place], [place] [address]    Report Status PENDING  Incomplete    Coagulation Studies: No results for input(s): "LABPROT", "INR" in the last 72 hours.   Urinalysis: No results for input(s): "COLORURINE", "LABSPEC", "PHURINE", "GLUCOSEU", "HGBUR", "BILIRUBINUR", "KETONESUR", "PROTEINUR", "UROBILINOGEN", "NITRITE", "LEUKOCYTESUR" in the last 72 hours.  Invalid input(s): "APPERANCEUR"    Imaging: ECHOCARDIOGRAM COMPLETE  Result Date: 06/28/2023    ECHOCARDIOGRAM REPORT   Patient Name:   ADRIENNE WINDERS Date of Exam: 06/28/2023 Medical Rec #:  [id number]     Height:       70.0 in Accession #:    [id number]    Weight:       213.8 lb Date of Birth:  1951-08-04     BSA:          2.148 m Patient Age:    72 years      BP:           128/46 mmHg Patient Gender: M             HR:           72 bpm. Exam Location:  [facility] Procedure: 2D Echo, Color Doppler and Cardiac Doppler Indications:     CHF-acute diastolic I50.31  History:         Patient has prior history of Echocardiogram examinations, most                  recent 05/09/2023. Risk Factors:Diabetes and Hypertension.  Sonographer:     Cristela Blue Referring Phys:  [id number] Gillis Santa Diagnosing Phys: Yvonne Kendall MD IMPRESSIONS  1. Left ventricular ejection fraction, by estimation, is 55 to 60%. The left ventricle has normal function. The left ventricle has no regional wall motion abnormalities. There is mild asymmetric left ventricular hypertrophy of the basal-septal segment. Left ventricular diastolic parameters were normal.  2. Right ventricular systolic function  is normal. The right ventricular size is normal. Tricuspid regurgitation signal is inadequate for assessing PA pressure.  3. The mitral valve is normal in structure. Mild mitral valve regurgitation. No evidence of mitral stenosis.  4. The aortic valve has an indeterminant number of cusps. Aortic valve regurgitation is not visualized. No aortic stenosis is present.  FINDINGS  Left Ventricle: Left ventricular ejection fraction, by estimation, is 55 to 60%. The left ventricle has normal function. The left ventricle has no regional wall motion abnormalities. The left ventricular internal cavity size was normal in size. There is  mild asymmetric left ventricular hypertrophy of the basal-septal segment. Left ventricular diastolic parameters were normal. Right Ventricle: The right ventricular size is normal. No increase in right ventricular wall thickness. Right ventricular systolic function is normal. Tricuspid regurgitation signal is inadequate for assessing PA pressure. Left Atrium: Left atrial size was normal in size. Right Atrium: Right atrial size was normal in size. Pericardium: The pericardium was not well visualized. Mitral Valve: The mitral valve is normal in structure. Mild mitral valve regurgitation. No evidence of mitral valve stenosis. Tricuspid Valve: The tricuspid valve is not well visualized. Tricuspid valve regurgitation is trivial. Aortic Valve: The aortic valve has an indeterminant number of cusps. Aortic valve regurgitation is not visualized. No aortic stenosis is present. Aortic valve mean gradient measures 4.0 mmHg. Aortic valve peak gradient measures 7.3 mmHg. Aortic valve area, by VTI measures 2.26 cm. Pulmonic Valve: The pulmonic valve was not well visualized. Pulmonic valve regurgitation is not visualized. No evidence of pulmonic stenosis. Aorta: The aortic root is normal in size and structure. Pulmonary Artery: The pulmonary artery is not well seen. Venous: The inferior vena cava was not well visualized. IAS/Shunts: The interatrial septum was not well visualized.  LEFT VENTRICLE PLAX 2D LVIDd:         5.20 cm   Diastology LVIDs:         3.60 cm   LV e' medial:    9.79 cm/s LV PW:         0.90 cm   LV E/e' medial:  11.5 LV IVS:        1.34 cm   LV e' lateral:   13.10 cm/s LVOT diam:     2.00 cm   LV E/e' lateral: 8.6 LV SV:         74 LV SV Index:   35  LVOT Area:     3.14 cm  RIGHT VENTRICLE RV Basal diam:  4.00 cm RV Mid diam:    3.30 cm RV S prime:     12.80 cm/s TAPSE (M-mode): 2.4 cm LEFT ATRIUM           Index        RIGHT ATRIUM           Index LA Vol (A2C): 46.2 ml 21.51 ml/m  RA Area:     13.90 cm LA Vol (A4C): 35.8 ml 16.67 ml/m  RA Volume:   34.20 ml  15.93 ml/m  AORTIC VALVE AV Area (Vmax):    2.18 cm AV Area (Vmean):   1.94 cm AV Area (VTI):     2.26 cm AV Vmax:           134.67 cm/s AV Vmean:          92.867 cm/s AV VTI:            0.329 m AV Peak Grad:      7.3 mmHg AV Mean Grad:  4.0 mmHg LVOT Vmax:         93.40 cm/s LVOT Vmean:        57.200 cm/s LVOT VTI:          0.236 m LVOT/AV VTI ratio: 0.72  AORTA Ao Root diam: 3.40 cm MITRAL VALVE MV Area (PHT): 3.77 cm     SHUNTS MV Decel Time: 201 msec     Systemic VTI:  0.24 m MV E velocity: 113.00 cm/s  Systemic Diam: 2.00 cm MV A velocity: 84.40 cm/s MV E/A ratio:  1.34 Yvonne Kendall MD Electronically signed by Yvonne Kendall MD Signature Date/Time: 06/28/2023/6:01:25 PM    Final    US THORACENTESIS ASP PLEURAL SPACE W/IMG GUIDE  Result Date: 06/28/2023 INDICATION: 72 year old male. History of positive D-dimer, pneumonia and right-sided pleural effusion. Request is for therapeutic and diagnostic right-sided thoracentesis EXAM: ULTRASOUND GUIDED THERAPEUTIC AND DIAGNOSTIC RIGHT-SIDED THORACENTESIS MEDICATIONS: Lidocaine 1% 10 mL COMPLICATIONS: None immediate. PROCEDURE: An ultrasound guided thoracentesis was thoroughly discussed with the patient and questions answered. The benefits, risks, alternatives and complications were also discussed. The patient understands and wishes to proceed with the procedure. Written consent was obtained. Ultrasound was performed to localize and mark an adequate pocket of fluid in the right chest. The area was then prepped and draped in the normal sterile fashion. 1% Lidocaine was used for local anesthesia. Under ultrasound guidance a 6 Fr Safe-T-Centesis  catheter was introduced. Thoracentesis was performed. The catheter was removed and a dressing applied. FINDINGS: A total of approximately 400 mL of serosanguineous fluid was removed. Samples were sent to the laboratory as requested by the clinical team. Ultrasound showed right-sided pleural effusion to be loculated. IMPRESSION: Successful ultrasound guided right-sided therapeutic and diagnostic thoracentesis yielding 400 mL of pleural fluid. Performed by: Anders Grant, NP Electronically Signed   By: Gilmer Mor D.O.   On: 06/28/2023 13:20   DG Chest Port 1 View  Result Date: 06/28/2023 CLINICAL DATA:  72 year old male with a history thoracentesis EXAM: PORTABLE CHEST 1 VIEW COMPARISON:  06/27/2023 FINDINGS: Cardiomediastinal silhouette unchanged in size and contour. Similar appearance of mixed interstitial and airspace opacities of the bilateral lungs, with improved aeration at the right lung base. Minimal blunting at the right costophrenic angle, significantly improved from the prior. Obscuration of the left hemidiaphragm persists. No pneumothorax. IMPRESSION: Improved right-sided pleural fluid status post thoracentesis, with no significant residual and no complicating features. Similar appearance of multifocal interstitial and airspace disease of the bilateral lungs, and persisting opacity at the left lung base. Electronically Signed   By: Gilmer Mor D.O.   On: 06/28/2023 13:16     Medications:    sodium chloride Stopped (06/27/23 2352)   ampicillin-sulbactam (UNASYN) IV 3 g (06/30/23 0933)   doxycycline (VIBRAMYCIN) IV 100 mg (06/30/23 0157)   furosemide (LASIX) 200 mg in dextrose 5 % 100 mL (2 mg/mL) infusion 4 mg/hr (06/29/23 1855)    vitamin C  500 mg Oral Daily   fluticasone  1 spray Each Nare Daily   folic acid  1 mg Oral Daily   gemfibrozil  600 mg Oral BID AC   guaiFENesin  600 mg Oral BID   insulin aspart  0-5 Units Subcutaneous QHS   insulin aspart  0-9 Units Subcutaneous  TID WC   insulin glargine-yfgn  7 Units Subcutaneous QHS   Interferon Beta-1b  0.25 mg Subcutaneous QODAY   iron polysaccharides  150 mg Oral Daily   loratadine  10 mg Oral Daily  mupirocin ointment  1 Application Nasal BID   nystatin   Topical TID   mouth rinse  15 mL Mouth Rinse 4 times per day   pantoprazole  40 mg Oral BID   pregabalin  200 mg Oral BID   sertraline  25 mg Oral Daily   simvastatin  40 mg Oral q1800   tamsulosin  0.4 mg Oral QPC supper   Vitamin D (Ergocalciferol)  50,000 Units Oral Q7 days   sodium chloride, acetaminophen, albuterol, guaiFENesin-dextromethorphan, hydrALAZINE, ondansetron (ZOFRAN) IV, mouth rinse, traMADol  Assessment/ Plan:  Mr. Bhavesh Church is a 72 y.o.  male with past medical conditions including hyperlipidemia, diabetes, hypertension, respiratory failure, COPD with 4 L nasal cannula at baseline, depression, diastolic heart failure, multiple sclerosis, liver cirrhosis, anemia, pulmonary hypertension, and chronic kidney disease stage IIIa.  Patient presents to the emergency department complaining of shortness of breath and has been admitted for Acute on chronic respiratory failure with hypoxia (HCC) [J96.21]   Chronic kidney disease stage 2,. Chronic kidney disease is secondary to diabetes, hypertension, advanced age - Achieving adequate diuresis with Furosemide drip - Will transition to oral diuresis today. Patient underwent CT angiogram on 06/27/2023. Has returned to normal range.  Lab Results  Component Value Date   CREATININE 1.23 06/30/2023   CREATININE 1.33 (H) 06/29/2023   CREATININE 1.16 06/28/2023    Intake/Output Summary (Last 24 hours) at 06/30/2023 1159 Last data filed at 06/30/2023 1047 Gross per 24 hour  Intake 840.44 ml  Output 2150 ml  Net -1309.56 ml   2.  Volume overload, appears multifactorial with contributing factors of COPD   CT angiogram from 06/26/2023-large right pleural effusion. -Continues to have dependent edema  over thighs and lower abdomen.  Stopping IV Furosemide and will order oral Furosemide 20mg  daily Right thoracentesis done on 06/28/2023.  400 cc fluid removed.  3. Anemia of chronic kidney disease Lab Results  Component Value Date   HGB 8.4 (L) 06/30/2023    Hemoglobin improved with 1 unit blood transfusion this admission on 06/23/2023.  4.  Diabetes mellitus type II with chronic kidney disease/renal manifestations: insulin dependent. Home regimen includes Lantus. Most recent hemoglobin A1c is 4.4 on 11/24/22.     LOS: 9 Dax Murguia 9/5/202411:59 AM

## 2023-06-30 NOTE — Progress Notes (Signed)
Occupational Therapy Treatment Patient Details Name: Jeremy [name] MRN: [id number] DOB: 1951/07/15 Today's Date: 06/30/2023   History of present illness Pt is a 72 year old male presenting to ED with SOB, admitted with acute on chronic respiratory failure due to CHF and COPD exacerbation, pleural effusion     PMH significant for HTN, HLD, DM, chronic respiratory failure, COPD on 4L O2, dCHF, GIB, depression, MS (wheelchair-bound), pulmonary hypertension, chronic pain, liver cirrhosis, obesity, anemia.   OT comments  Pt seen for OT/PT co-tx due to level of complexity and to maximize therapeutic outcomes. Pt motivated and put forth good efforts throughout tx session. Bed mobility max-total A +2 performed but note that assistance does fluctuate sitting EOB from CGA-maxA secondary to fatigue and heavy posterior push. Benefits from PLB cuing throughout with frequent prolonged rest breaks. Vitals monitored throughout session; BP 145/59 (83), with SPO2 varying between 84-95% on HFNC 8.5L. 1 x rep performed of dynamic reaching activity to challenge sitting balance EOB, limited by pt's poor tolerance to activity. Overall, was able to sit EOB with support 4-5 mins. Pt will continue to benefit from skilled OT services to address deficits and promote optimal return to PLOF. OT will continue to follow.        If plan is discharge home, recommend the following:  A lot of help with walking and/or transfers;A lot of help with bathing/dressing/bathroom;Assistance with cooking/housework;Assist for transportation;Help with stairs or ramp for entrance;Direct supervision/assist for financial management   Equipment Recommendations  None recommended by OT    Recommendations for Other Services      Precautions / Restrictions Precautions Precautions: Fall Precaution Comments: watch 02 Restrictions Weight Bearing Restrictions: No       Mobility Bed Mobility Overal bed mobility: Needs Assistance Bed Mobility:  Supine to Sit, Sit to Supine     Supine to sit: Max assist, +2 for physical assistance, +2 for safety/equipment, HOB elevated, Used rails Sit to supine: Max assist, +2 for physical assistance, +2 for safety/equipment, Used rails, Total assist   General bed mobility comments: Initiates bed mobility but MAXA +2 increasing to TOTAL A x 2 to perform. Heavy posterior push as OT/PT team attempt to transition towards EOB. Dizziness reported upright, 02 sats decreasing to 85% for two mins before increasing 93%>.    Transfers                   General transfer comment: not attempted due to poor activity tolerance and safety concerns     Balance Overall balance assessment: Needs assistance Sitting-balance support: Bilateral upper extremity supported, Feet supported Sitting balance-Leahy Scale: Poor Sitting balance - Comments: Pt able to maintain seated EOB balance CGA for approx 10 sec, and then required max A secondary to heavy posterior lean and decrease activity tolerance Postural control: Posterior lean                                 ADL either performed or assessed with clinical judgement   ADL                                         General ADL Comments: Session focused on improving activity tolerance, static/dynamic seated balance and bed mobility. ADLs not performed this date. +2 for bed mobility, flucuating performance.    Extremity/Trunk Assessment Upper Extremity Assessment  Upper Extremity Assessment: Generalized weakness             Cognition Arousal: Alert Behavior During Therapy: WFL for tasks assessed/performed Overall Cognitive Status: Impaired/Different from baseline Area of Impairment: Problem solving                             Problem Solving: Slow processing General Comments: Pleasant and cooperative for session; overall better appearance        Exercises Other Exercises Other Exercises: 1x dynamic seated  balance with 1 UE reach but unable to perform more reps due to increase fatigue       General Comments BP 145-59 (83), 8.5 HFNC sats ranging between 84-95%. Benefitted from PLB and frequent activity breaks    Pertinent Vitals/ Pain       Pain Assessment Pain Assessment: No/denies pain   Frequency  Min 1X/week        Progress Toward Goals  OT Goals(current goals can now be found in the care plan section)  Progress towards OT goals: Progressing toward goals  Acute Rehab OT Goals OT Goal Formulation: With patient/family Time For Goal Achievement: 07/12/23 Potential to Achieve Goals: Good ADL Goals Pt Will Perform Grooming: with set-up;sitting Pt Will Perform Lower Body Dressing: with mod assist;sitting/lateral leans Pt Will Transfer to Toilet: with max assist;squat pivot transfer Pt Will Perform Toileting - Clothing Manipulation and hygiene: with max assist;sitting/lateral leans  Plan      Co-evaluation    PT/OT/SLP Co-Evaluation/Treatment: Yes Reason for Co-Treatment: Complexity of the patient's impairments (multi-system involvement);Necessary to address cognition/behavior during functional activity;For patient/therapist safety PT goals addressed during session: Mobility/safety with mobility OT goals addressed during session: ADL's and self-care      AM-PAC OT "6 Clicks" Daily Activity     Outcome Measure   Help from another person eating meals?: A Little Help from another person taking care of personal grooming?: A Little Help from another person toileting, which includes using toliet, bedpan, or urinal?: Total Help from another person bathing (including washing, rinsing, drying)?: Total Help from another person to put on and taking off regular upper body clothing?: A Lot Help from another person to put on and taking off regular lower body clothing?: A Lot 6 Click Score: 12    End of Session Equipment Utilized During Treatment: Oxygen  OT Visit Diagnosis:  Other abnormalities of gait and mobility (R26.89);Muscle weakness (generalized) (M62.81)   Activity Tolerance Patient limited by fatigue   Patient Left in bed;with call bell/phone within reach;with family/visitor present   Nurse Communication Mobility status        Time: 4098-1191 OT Time Calculation (min): 34 min  Charges: OT General Charges $OT Visit: 1 Visit OT Treatments $Self Care/Home Management : 8-22 mins  Estevon Fluke L. Empress Newmann, OTR/L  06/30/23, 1:23 PM

## 2023-06-30 NOTE — Care Management Important Message (Signed)
Important Message  Patient Details  Name: Jeremy [name] MRN: [id number] Date of Birth: Oct 07, 1951   Medicare Important Message Given:  Yes     [name] 06/30/2023, 2:30 PM

## 2023-07-01 DIAGNOSIS — J441 Chronic obstructive pulmonary disease with (acute) exacerbation: Secondary | ICD-10-CM | POA: Diagnosis not present

## 2023-07-01 DIAGNOSIS — I272 Pulmonary hypertension, unspecified: Secondary | ICD-10-CM | POA: Diagnosis not present

## 2023-07-01 DIAGNOSIS — J9621 Acute and chronic respiratory failure with hypoxia: Secondary | ICD-10-CM | POA: Diagnosis not present

## 2023-07-01 DIAGNOSIS — I5033 Acute on chronic diastolic (congestive) heart failure: Secondary | ICD-10-CM | POA: Diagnosis not present

## 2023-07-01 LAB — BASIC METABOLIC PANEL
Anion gap: 10 (ref 5–15)
BUN: 45 mg/dL — ABNORMAL HIGH (ref 8–23)
CO2: 30 mmol/L (ref 22–32)
Calcium: 8.1 mg/dL — ABNORMAL LOW (ref 8.9–10.3)
Chloride: 104 mmol/L (ref 98–111)
Creatinine, Ser: 1.41 mg/dL — ABNORMAL HIGH (ref 0.61–1.24)
GFR, Estimated: 53 mL/min — ABNORMAL LOW (ref >60)
Glucose, Bld: 104 mg/dL — ABNORMAL HIGH (ref 70–99)
Potassium: 4 mmol/L (ref 3.5–5.1)
Sodium: 144 mmol/L (ref 135–145)

## 2023-07-01 LAB — CBC
HCT: 28.5 % — ABNORMAL LOW (ref 39.0–52.0)
Hemoglobin: 8.8 g/dL — ABNORMAL LOW (ref 13.0–17.0)
MCH: 27.4 pg (ref 26.0–34.0)
MCHC: 30.9 g/dL (ref 30.0–36.0)
MCV: 88.8 fL (ref 80.0–100.0)
Platelets: 221 10*3/uL (ref 150–400)
RBC: 3.21 MIL/uL — ABNORMAL LOW (ref 4.22–5.81)
RDW: 17.7 % — ABNORMAL HIGH (ref 11.5–15.5)
WBC: 5.5 10*3/uL (ref 4.0–10.5)
nRBC: 0 % (ref 0.0–0.2)

## 2023-07-01 LAB — GLUCOSE, CAPILLARY
Glucose-Capillary: 89 mg/dL (ref 70–99)
Glucose-Capillary: 110 mg/dL — ABNORMAL HIGH (ref 70–99)
Glucose-Capillary: 147 mg/dL — ABNORMAL HIGH (ref 70–99)
Glucose-Capillary: 126 mg/dL — ABNORMAL HIGH (ref 70–99)

## 2023-07-01 MED ORDER — DOXYCYCLINE HYCLATE 100 MG PO TABS
100.0000 mg | ORAL_TABLET | Freq: Two times a day (BID) | ORAL | Status: AC
  Administered 2023-07-01 – 2023-07-02 (×4): 100 mg via ORAL
  Filled 2023-07-01 (×4): qty 1

## 2023-07-01 NOTE — Consult Note (Signed)
PHARMACY CONSULT NOTE - ELECTROLYTES  Pharmacy Consult for Electrolyte Monitoring and Replacement   Recent Labs: Height: 5\' 10"  (177.8 cm) Weight: 92.6 kg (204 lb 3.4 oz) IBW/kg (Calculated) : 73 Estimated Creatinine Clearance: 54.9 mL/min (A) (by C-G formula based on SCr of 1.41 mg/dL (H)). Potassium (mmol/L)  Date Value  07/01/2023 4.0  08/31/2013 3.5   Magnesium (mg/dL)  Date Value  08/21/2535 2.1   Calcium (mg/dL)  Date Value  64/40/3474 8.1 (L)   Calcium, Total (mg/dL)  Date Value  25/95/6387 8.9   Albumin (g/dL)  Date Value  56/43/3295 2.7 (L)  02/15/2022 3.6 (L)  08/31/2013 2.9 (L)   Phosphorus (mg/dL)  Date Value  18/84/1660 3.3  08/28/2013 4.9   Sodium (mmol/L)  Date Value  07/01/2023 144  02/15/2022 138  08/31/2013 143   Assessment  Jeremy [name] is a 72 y.o. male presenting with pneumonia. PMH significant for HTN, HLD, DM, chronic respiratory failure / COPD on 4L O2, diastolic CHF, GIB, depression, MS (wheel-chair bound), pulmonary HTN, chronic pain, liver cirrhosis, obesity, anemia. CT chest revealed features compatible with multifocal pneumonia. Pharmacy has been consulted to monitor and replace electrolytes.  Diet: heart healthy/carb modified - aspiration precautions MIVF: n/a Pertinent medications: furosemide infusion  Goal of Therapy: Electrolytes WNL  Plan:  Electrolytes WNL no replacement needed Follow AM labs and replace as needed.  Thank you for allowing pharmacy to be a part of this patient's care.  Delancey Moraes Rodriguez-Guzman PharmD, BCPS 07/01/2023 7:53 AM

## 2023-07-01 NOTE — Consult Note (Signed)
Pharmacy Antibiotic Note  Jeremy [name] is a 72 y.o. male admitted on 06/21/2023 with pneumonia. PMH significant for HTN, HLD, DM, chronic respiratory failure / COPD on 4L O2, diastolic CHF, GIB, depression, MS (wheel-chair bound), pulmonary HTN, chronic pain, liver cirrhosis, obesity, anemia. CT chest revealed features compatible with multifocal pneumonia. Pharmacy has been consulted for Unasyn dosing.  Plan: Day 6 of antibiotics Unasyn Continue Unasyn 3 g IV Q6H pending cultures results Patient is also on doxycycline 100 mg IV Q12H Continue to monitor renal function and follow culture results  Plan discussed with provider. Okay to enter 7 day total to current Abx regimen.  Height: 5\' 10"  (177.8 cm) Weight: 92.6 kg (204 lb 3.4 oz) IBW/kg (Calculated) : 73  Temp (24hrs), Avg:98 F (36.7 C), Min:97.5 F (36.4 C), Max:98.6 F (37 C)  Recent Labs  Lab 06/27/23 0308 06/28/23 0441 06/29/23 0335 06/30/23 0600 07/01/23 0306  WBC 7.2 6.2 5.7 5.1 5.5  CREATININE 1.25* 1.16 1.33* 1.23 1.41*    Estimated Creatinine Clearance: 54.9 mL/min (A) (by C-G formula based on SCr of 1.41 mg/dL (H)).    Allergies  Allergen Reactions   Betadine [Povidone Iodine]     Antimicrobials this admission: 8/27 Vancomycin x1 8/27 Azithromycin x1 8/27 Cefepime x1 8/27 Doxycycline >> 8/30 9/1 Unasyn >> (7 days) 9/1 Doxycycline >> (7 days)  Dose adjustments this admission: N/A  Microbiology results: 8/27 BCx: NG final 8/27 MRSA PCR: positive 8/30 Peritoneal Cx: NGTD 9/3 Pleural fluid cx: NGTD  Thank you for allowing pharmacy to be a part of this patient's care.  Florencia Zaccaro Rodriguez-Guzman PharmD, BCPS 07/01/2023 7:55 AM

## 2023-07-01 NOTE — Plan of Care (Signed)
  Problem: Education: Goal: Ability to describe self-care measures that may prevent or decrease complications (Diabetes Survival Skills Education) will improve Outcome: Progressing   Problem: Coping: Goal: Ability to adjust to condition or change in health will improve Outcome: Progressing   Problem: Health Behavior/Discharge Planning: Goal: Ability to identify and utilize available resources and services will improve Outcome: Progressing Goal: Ability to manage health-related needs will improve Outcome: Progressing   Problem: Metabolic: Goal: Ability to maintain appropriate glucose levels will improve Outcome: Progressing   Problem: Nutritional: Goal: Progress toward achieving an optimal weight will improve Outcome: Progressing   Problem: Skin Integrity: Goal: Risk for impaired skin integrity will decrease Outcome: Progressing   Problem: Tissue Perfusion: Goal: Adequacy of tissue perfusion will improve Outcome: Progressing   Problem: Education: Goal: Ability to demonstrate management of disease process will improve Outcome: Progressing Goal: Ability to verbalize understanding of medication therapies will improve Outcome: Progressing   Problem: Activity: Goal: Capacity to carry out activities will improve Outcome: Progressing

## 2023-07-01 NOTE — Progress Notes (Signed)
OT Cancellation Note  Patient Details Name: Jeremy [name] MRN: [id number] DOB: 1951-01-12   Cancelled Treatment:    Reason Eval/Treat Not Completed: Other (comment) (Pt in bed with spouse present; expressing that he is having a rough day as MD recommended returning home with pallative care. OT provided emotional support, monitored SPO2% 87-89 on 9L HFNC and assisted RN with scooting pt up in bed. OT will check back as able. RN messaged to inquire about chaplain consult.   Amabel Stmarie L. Lee Kalt, OTR/L  07/01/23, 12:14 PM

## 2023-07-01 NOTE — Progress Notes (Signed)
[facility]  ROUNDING NOTE   Subjective:   Jeremy [name] is a 72 year old male with past medical conditions including hyperlipidemia, diabetes, hypertension, respiratory failure, COPD with 4 L nasal cannula at baseline, depression, diastolic heart failure, multiple sclerosis, liver cirrhosis, anemia, pulmonary hypertension, and chronic kidney disease stage IIIa.  Patient presents to the emergency department complaining of shortness of breath and has been admitted for Acute on chronic respiratory failure with hypoxia [facility]) [J96.21]  Patient is known to our practice from outpatient follow-up.  Patient was last seen in office on 07/01/2022 for routine follow-up.    Patient seen sitting up in bed, chronically ill-appearing Wife at bedside Wife concerned respiratory status has not improved much during this admission.  Patient remains on 9 L high flow nasal cannula Also has productive cough Edema has greatly improved  Creatinine 1.41 Urine output 2.1 L in preceding 24 hours.  Objective:  Vital signs in last 24 hours:  Temp:  [97.5 F (36.4 C)-99 F (37.2 C)] 99 F (37.2 C) (09/06 1215) Pulse Rate:  [70-73] 72 (09/06 1215) Resp:  [18-22] 22 (09/06 1215) BP: (128-153)/(53-64) 153/63 (09/06 1215) SpO2:  [89 %-98 %] 97 % (09/06 1215) Weight:  [92.6 kg] 92.6 kg (09/06 0309)  Weight change:  Filed Weights   06/28/23 0500 06/29/23 0500 07/01/23 0309  Weight: 97 kg 97.8 kg 92.6 kg    Intake/Output: I/O last 3 completed shifts: In: 600 [P.O.:600] Out: 2650 [Urine:2650]   Intake/Output this shift:  Total I/O In: 240 [P.O.:240] Out: 800 [Urine:800]  Physical Exam: General: NAD  Head: Normocephalic, atraumatic. Moist oral mucosal membranes  Eyes: Anicteric  Lungs:  Fine crackles, [place], productive cough  Heart: Regular rate and rhythm  Abdomen:  Soft, nontender, nondistended  Extremities: + dependent peripheral edema.  Neurologic: Alert and oriented, able to follow simple  commands  Skin: No lesions       Basic Metabolic Panel: Recent Labs  Lab 06/26/23 0516 06/27/23 0308 06/28/23 0441 06/29/23 0335 06/30/23 0600 07/01/23 0306  NA 136 140 144 144 142 144  K 3.1* 3.4* 3.2* 3.4* 3.6 4.0  CL 102 102 106 103 102 104  CO2 26 28 30 29 29 30   GLUCOSE 83 111* 94 85 91 104*  BUN 52* 46* 41* 39* 40* 45*  CREATININE 1.26* 1.25* 1.16 1.33* 1.23 1.41*  CALCIUM 7.8* 8.1* 8.1* 7.9* 7.9* 8.1*  MG 1.6* 1.9 1.9 1.6* 2.1  --   PHOS 2.9 2.5 3.2 3.2 3.3  --     Liver Function Tests: Recent Labs  Lab 06/25/23 0539 06/26/23 0516 06/27/23 0308  AST 27 18 21   ALT 12 12 13   ALKPHOS 57 58 66  BILITOT 0.9 1.0 1.3*  PROT 5.1* 5.1* 6.2*  ALBUMIN 2.1* 2.3* 2.7*   No results for input(s): "LIPASE", "AMYLASE" in the last 168 hours. No results for input(s): "AMMONIA" in the last 168 hours.   CBC: Recent Labs  Lab 06/27/23 0308 06/28/23 0441 06/28/23 1603 06/29/23 0335 06/30/23 0600 07/01/23 0306  WBC 7.2 6.2  --  5.7 5.1 5.5  HGB 9.1* 8.5* 9.6* 8.4* 8.4* 8.8*  HCT 29.1* 26.6* 30.5* 27.1* 26.6* 28.5*  MCV 87.4 88.1  --  88.3 86.4 88.8  PLT 272 238  --  232 204 221    Cardiac Enzymes: No results for input(s): "CKTOTAL", "CKMB", "CKMBINDEX", "TROPONINI" in the last 168 hours.  BNP: Invalid input(s): "POCBNP"  CBG: Recent Labs  Lab 06/30/23 1215 06/30/23 1650 06/30/23 2130  07/01/23 0744 07/01/23 1215  GLUCAP 136* 121* 135* 89 110*    Microbiology: Results for orders placed or performed during the hospital encounter of 06/21/23  Culture, blood (Routine x 2)     Status: None   Collection Time: 06/21/23  3:02 PM   Specimen: BLOOD  Result Value Ref Range Status   Specimen Description BLOOD LEFT ANTECUBITAL  Final   Special Requests   Final    BOTTLES DRAWN AEROBIC AND ANAEROBIC Blood Culture adequate volume   Culture   Final    NO GROWTH 5 DAYS Performed at [facility], [address], [place], [place] [address]    Report  Status 06/26/2023 FINAL  Final  Culture, blood (Routine x 2)     Status: None   Collection Time: 06/21/23  3:13 PM   Specimen: BLOOD  Result Value Ref Range Status   Specimen Description BLOOD RIGHT ANTECUBITAL  Final   Special Requests   Final    BOTTLES DRAWN AEROBIC AND ANAEROBIC Blood Culture adequate volume   Culture   Final    NO GROWTH 5 DAYS Performed at [facility], [address] [place], [place] [address]    Report Status 06/26/2023 FINAL  Final  Resp panel by RT-PCR (RSV, Flu A&B, Covid) Anterior Nasal Swab     Status: None   Collection Time: 06/21/23  4:35 PM   Specimen: Anterior Nasal Swab  Result Value Ref Range Status   SARS Coronavirus 2 by RT PCR NEGATIVE NEGATIVE Final    Comment: (NOTE) SARS-CoV-2 target nucleic acids are NOT DETECTED.  The SARS-CoV-2 RNA is generally detectable in upper respiratory specimens during the acute phase of infection. The lowest concentration of SARS-CoV-2 viral copies this assay can detect is 138 copies/mL. A negative result does not preclude SARS-Cov-2 infection and should not be used as the sole basis for treatment or other patient management decisions. A negative result may occur with  improper specimen collection/handling, submission of specimen other than nasopharyngeal swab, presence of viral mutation(s) within the areas targeted by this assay, and inadequate number of viral copies(<138 copies/mL). A negative result must be combined with clinical observations, patient history, and epidemiological information. The expected result is Negative.  Fact Sheet for Patients:  BloggerCourse.com  Fact Sheet for Healthcare Providers:  SeriousBroker.it  This test is no t yet approved or cleared by the [place] FDA and  has been authorized for detection and/or diagnosis of SARS-CoV-2 by FDA under an Emergency Use Authorization (EUA). This EUA will remain  in effect  (meaning this test can be used) for the duration of the COVID-19 declaration under Section 564(b)(1) of the Act, 21 U.S.C.section 360bbb-3(b)(1), unless the authorization is terminated  or revoked sooner.       Influenza A by PCR NEGATIVE NEGATIVE Final   Influenza B by PCR NEGATIVE NEGATIVE Final    Comment: (NOTE) The Xpert Xpress SARS-CoV-2/FLU/RSV plus assay is intended as an aid in the diagnosis of influenza from Nasopharyngeal swab specimens and should not be used as a sole basis for treatment. Nasal washings and aspirates are unacceptable for Xpert Xpress SARS-CoV-2/FLU/RSV testing.  Fact Sheet for Patients: BloggerCourse.com  Fact Sheet for Healthcare Providers: SeriousBroker.it  This test is not yet approved or cleared by the [place] FDA and has been authorized for detection and/or diagnosis of SARS-CoV-2 by FDA under an Emergency Use Authorization (EUA). This EUA will remain in effect (meaning this test can be used) for the duration of  the COVID-19 declaration under Section 564(b)(1) of the Act, 21 U.S.C. section 360bbb-3(b)(1), unless the authorization is terminated or revoked.     Resp Syncytial Virus by PCR NEGATIVE NEGATIVE Final    Comment: (NOTE) Fact Sheet for Patients: BloggerCourse.com  Fact Sheet for Healthcare Providers: SeriousBroker.it  This test is not yet approved or cleared by the [place] FDA and has been authorized for detection and/or diagnosis of SARS-CoV-2 by FDA under an Emergency Use Authorization (EUA). This EUA will remain in effect (meaning this test can be used) for the duration of the COVID-19 declaration under Section 564(b)(1) of the Act, 21 U.S.C. section 360bbb-3(b)(1), unless the authorization is terminated or revoked.  Performed at [facility], [address] [place], [place] [address]   MRSA Next Gen by  PCR, Nasal     Status: Abnormal   Collection Time: 06/21/23  8:14 PM   Specimen: Nasal Mucosa; Nasal Swab  Result Value Ref Range Status   MRSA by PCR Next Gen DETECTED (A) NOT DETECTED Final    Comment: RESULT CALLED TO, READ BACK BY AND VERIFIED WITH: KRISTY SNEEAD AT 2206 ON 06/21/23 BY SS (NOTE) The GeneXpert MRSA Assay (FDA approved for NASAL specimens only), is one component of a comprehensive MRSA colonization surveillance program. It is not intended to diagnose MRSA infection nor to guide or monitor treatment for MRSA infections. Test performance is not FDA approved in patients less than 48 years old. Performed at [facility], [address] [place], [place] [address]   Respiratory (~20 pathogens) panel by PCR     Status: None   Collection Time: 06/22/23  3:22 PM   Specimen: Nasopharyngeal Swab; Respiratory  Result Value Ref Range Status   Adenovirus NOT DETECTED NOT DETECTED Final   Coronavirus 229E NOT DETECTED NOT DETECTED Final    Comment: (NOTE) The Coronavirus on the Respiratory Panel, DOES NOT test for the novel  Coronavirus (2019 nCoV)    Coronavirus HKU1 NOT DETECTED NOT DETECTED Final   Coronavirus NL63 NOT DETECTED NOT DETECTED Final   Coronavirus OC43 NOT DETECTED NOT DETECTED Final   Metapneumovirus NOT DETECTED NOT DETECTED Final   Rhinovirus / Enterovirus NOT DETECTED NOT DETECTED Final   Influenza A NOT DETECTED NOT DETECTED Final   Influenza B NOT DETECTED NOT DETECTED Final   Parainfluenza Virus 1 NOT DETECTED NOT DETECTED Final   Parainfluenza Virus 2 NOT DETECTED NOT DETECTED Final   Parainfluenza Virus 3 NOT DETECTED NOT DETECTED Final   Parainfluenza Virus 4 NOT DETECTED NOT DETECTED Final   Respiratory Syncytial Virus NOT DETECTED NOT DETECTED Final   Bordetella pertussis NOT DETECTED NOT DETECTED Final   Bordetella Parapertussis NOT DETECTED NOT DETECTED Final   Chlamydophila pneumoniae NOT DETECTED NOT DETECTED Final   Mycoplasma  pneumoniae NOT DETECTED NOT DETECTED Final    Comment: Performed at [facility], [address], [place], [place] [address]  Body fluid culture w Gram Stain     Status: None   Collection Time: 06/24/23 12:12 PM   Specimen: PATH Cytology Peritoneal fluid  Result Value Ref Range Status   Specimen Description   Final    PERITONEAL Performed at [facility], [address], [place], [place] [address]    Special Requests   Final    NONE Performed at [facility], [address] [place], [place] [address]    Gram Stain NO WBC SEEN NO ORGANISMS SEEN   Final   Culture  Final    NO GROWTH 3 DAYS Performed at [facility], [address], [place], [place] [address]    Report Status 06/27/2023 FINAL  Final  MRSA Next Gen by PCR, Nasal     Status: Abnormal   Collection Time: 06/26/23  5:43 PM   Specimen: Nasal Mucosa; Nasal Swab  Result Value Ref Range Status   MRSA by PCR Next Gen DETECTED (A) NOT DETECTED Final    Comment: RESULT CALLED TO, READ BACK BY AND VERIFIED WITH: [facility] WHITE 06/26/2023 2055 CP (NOTE) The GeneXpert MRSA Assay (FDA approved for NASAL specimens only), is one component of a comprehensive MRSA colonization surveillance program. It is not intended to diagnose MRSA infection nor to guide or monitor treatment for MRSA infections. Test performance is not FDA approved in patients less than 67 years old. Performed at [facility], [address] [place], [place] [address]   Body fluid culture w Gram Stain     Status: None (Preliminary result)   Collection Time: 06/28/23 12:25 PM   Specimen: PATH Cytology Pleural fluid  Result Value Ref Range Status   Specimen Description   Final    PLEURAL Performed at [facility], [address] [place], [place] [address]    Special Requests   Final    NONE Performed at [facility], [address] [place], [place] [address]    Gram Stain NO WBC SEEN NO  ORGANISMS SEEN   Final   Culture   Final    NO GROWTH 3 DAYS Performed at [facility], [address] [place], [place] [address]    Report Status PENDING  Incomplete    Coagulation Studies: No results for input(s): "LABPROT", "INR" in the last 72 hours.   Urinalysis: No results for input(s): "COLORURINE", "LABSPEC", "PHURINE", "GLUCOSEU", "HGBUR", "BILIRUBINUR", "KETONESUR", "PROTEINUR", "UROBILINOGEN", "NITRITE", "LEUKOCYTESUR" in the last 72 hours.  Invalid input(s): "APPERANCEUR"    Imaging: No results found.   Medications:    sodium chloride Stopped (06/27/23 2352)   ampicillin-sulbactam (UNASYN) IV 3 g (07/01/23 1042)    vitamin C  500 mg Oral Daily   doxycycline  100 mg Oral Q12H   fluticasone  1 spray Each Nare Daily   folic acid  1 mg Oral Daily   furosemide  20 mg Oral Daily   gemfibrozil  600 mg Oral BID AC   guaiFENesin  600 mg Oral BID   insulin aspart  0-5 Units Subcutaneous QHS   insulin aspart  0-9 Units Subcutaneous TID WC   insulin glargine-yfgn  7 Units Subcutaneous QHS   Interferon Beta-1b  0.25 mg Subcutaneous QODAY   iron polysaccharides  150 mg Oral Daily   loratadine  10 mg Oral Daily   nystatin   Topical TID   mouth rinse  15 mL Mouth Rinse 4 times per day   pantoprazole  40 mg Oral BID   pregabalin  200 mg Oral BID   sertraline  25 mg Oral Daily   simvastatin  40 mg Oral q1800   tamsulosin  0.4 mg Oral QPC supper   Vitamin D (Ergocalciferol)  50,000 Units Oral Q7 days   sodium chloride, acetaminophen, albuterol, guaiFENesin-dextromethorphan, hydrALAZINE, ondansetron (ZOFRAN) IV, mouth rinse, traMADol  Assessment/ Plan:  Mr. Sreyas Shiba is a 72 y.o.  male with past medical conditions including hyperlipidemia, diabetes, hypertension, respiratory failure, COPD with 4 L nasal cannula at baseline, depression, diastolic heart failure, multiple sclerosis, liver cirrhosis, anemia,  pulmonary hypertension, and chronic kidney disease stage  IIIa.  Patient presents to the emergency department complaining of shortness of breath and has been admitted for Acute on chronic respiratory failure with hypoxia (HCC) [J96.21]   Chronic kidney disease stage 2,. Chronic kidney disease is secondary to diabetes, hypertension, advanced age -Transition to oral diuresis yesterday, furosemide 20 mg daily. - Adequate urine output noted - Will continue current treatments.  Lab Results  Component Value Date   CREATININE 1.41 (H) 07/01/2023   CREATININE 1.23 06/30/2023   CREATININE 1.33 (H) 06/29/2023    Intake/Output Summary (Last 24 hours) at 07/01/2023 1329 Last data filed at 07/01/2023 1100 Gross per 24 hour  Intake 480 ml  Output 2300 ml  Net -1820 ml   2.  Volume overload, appears multifactorial with contributing factors of COPD   CT angiogram from 06/26/2023-large right pleural effusion. -Overall, generalized edema has improved.  Some edema remains in abdomen/thighs.  Will continue current regimen. Right thoracentesis done on 06/28/2023.  400 cc fluid removed.  3. Anemia of chronic kidney disease Lab Results  Component Value Date   HGB 8.8 (L) 07/01/2023    Patient has received blood transfusion during this admission on 06/23/2023.  Hemoglobin just below desired range.  4.  Diabetes mellitus type II with chronic kidney disease/renal manifestations: insulin dependent. Home regimen includes Lantus. Most recent hemoglobin A1c is 4.4 on 11/24/22.   Glucose well-controlled.   LOS: 10 Chelcey Caputo 9/6/20241:29 PM

## 2023-07-01 NOTE — Progress Notes (Signed)
PHARMACIST - PHYSICIAN COMMUNICATION DR:   Clide Dales CONCERNING: Antibiotic IV to Oral Route Change Policy  RECOMMENDATION: This patient is receiving Doxycycline by the intravenous route.  Based on criteria approved by the Pharmacy and Therapeutics Committee, the antibiotic(s) is/are being converted to the equivalent oral dose form(s).   DESCRIPTION: These criteria include: Patient being treated for a respiratory tract infection, urinary tract infection, cellulitis or clostridium difficile associated diarrhea if on metronidazole The patient is not neutropenic and does not exhibit a GI malabsorption state The patient is eating (either orally or via tube) and/or has been taking other orally administered medications for a least 24 hours The patient is improving clinically and has a Tmax < 100.5  If you have questions about this conversion, please contact the Pharmacy Department   Laray Corbit Rodriguez-Guzman PharmD, BCPS 07/01/2023 8:02 AM

## 2023-07-01 NOTE — Progress Notes (Signed)
Progress Note   Patient: Jeremy [name] ZOX:[id number] DOB: 03-28-1951 DOA: 06/21/2023     10 DOS: the patient was seen and examined on 07/01/2023   Brief hospital course: Jerson Kittle is a 72 y.o. male with medical history significant of HTN, HLD, DM, chronic respiratory failure, COPD on 4L O2, dCHF, GIB, depression, MS (wheelchair-bound), pulmonary hypertension, chronic pain, liver cirrhosis, obesity, anemia, who presents with SOB.   Patient was recently hospitalized from 7/12 - 7/26 due to CHF exacerbation and right lobar pneumonia. Patient usually on 4 L supplemental oxygen is currently requiring 8 L.  Patient has been having worsening shortness of breath and leg swelling with 13lb  weight gain.  Patient was started on IV Lasix, IV Solu-Medrol and nebulizer therapy along with IV antibiotics.   Assessment and Plan: Acute on chronic hypoxic respiratory failure most likely due to CHF exacerbation, Multifocal pneumonia  Patient uses 4 L oxygen at baseline currently on 8-9 L.  Continue to wean oxygen as tolerated to maintain o2 saturation around 92%. CT chest: Bilateral lung collapse, multifocal pneumonia, bilateral effusion R >L. 9/1 started Unasyn, pharmacy consulted and doxycycline 100 mg IV twice daily for aspiration PNA and to cover MRSA as well. Total duration of antibiotics 7 days. 9/2 patient's respiratory status got worse on 9/1, moved to stepdown unit, PCCM on board. Encourage incentive spirometry. 9/5 moved out of step down, still on 8L Advised RN to wean O2 to 4L and maintain 90-92% saturation. Continue IV Lasix for diuresis, albumin as needed,   Diastolic CHF exacerbation and anasarca with significant lower extremity edema Patient presented with shortness of breath and LE edema S/p Lasix 40 mg IV twice daily Lasix IV infusion changed to oral 20mg  daily per nephrology. S/p albumin infusions Continue to aim for net negative output. 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  As per patient's wife patient cannot have more procedures done due to respiratory failure and he may not wake up.  Patient and his wife would prefer conservative management.  8/30 Hb 6.9, 1 unit PRBC transfused. Hb 8.8 stable.  Stage 3a chronic kidney disease  Recent baseline 0.90 1.2-1.4.   creatinine 1.41 stable on current diuresis Monitor renal function and urine output S/p IV albumin as above Nephrology on board, follow further recommendations.   COPD exacerbation, most likely due to volume overload s/p Solu-Medrol 40 mg IV BID, DC'd on 8/29  S/p  doxycycline 100 mg p.o. twice daily d/c'd on 8/30 Started Mucinex 600 mg p.o. twice daily Continue Robitussin DM as needed   Pleural effusion Moderate right pleural effusion most likely due to CHF, liver cirrhosis with hypoalbuminemia. Continue diuresis as above 8/30 s/p thoracentesis 1.2 L fluid was tapped by IR, postprocedure x-ray did not show any complications. Fluid studies, as per lights criteria, LDH elevated, consistent with exudative but clinically no signs of infection. Started Abx due to CT scan findings of multifocal pneumonia, bilateral effusion, right is moderate. 9/3 s/p repeat right thoracentesis, 400 mL fluid was removed.   Type 2 diabetes mellitus- Continue Semglee 7 units nightly Accucheks with sliding scale insulin. Continue diabetic diet  Hypoglycemia protocol.   Essential hypertension IV hydralazine as needed Continue IV Lasix   Hyperlipidemia Zocor and lopid   Multiple sclerosis (HCC) pt is on Betaseron  injection Fall precautions   Myocardial injury:  troponin level 21 --> -20.   No chest pain Continue Zocor   Folic acid deficiency, folate level 5.2 Daily folate supplement.   Hypokalemia-  secondary to diuresis, potassium repleted. Monitor electrolytes daily  Hypomagnesemia- Replete as needed and recheck.  Hypotonic hyponatremia: Resolved Serum osmolality 265 low Mental status normal. He got Sodium chloride 2 g p.o. 3 times daily for 5 days Continue fluid restriction 1200 ml/d Na stable at 144   Cirrhosis of liver without ascites (HCC): Mental status normal.  INR 1.3 Ammonia level 26 wnl   Chronic urinary retention: Foley removed , he is able to void. He is at high risk of retention. Bladder scan per nursing protocol. Continue Flomax. If persistent retention, replace foley with outpatient urology follow up.                                                                                                                                                                                                                                                                                                               Depression Continue home medication, Zoloft   Vitamin D deficiency: started vitamin D 50,000 units p.o. weekly, follow with PCP to repeat vitamin D level after 3 to 6 months.   Obesity Body mass index is 30.68 kg/m.  Diet, exercise and weight reduction advised.   Diet: Heart healthy/carb modified, fluid striction 1200 mL/day DVT Prophylaxis: Subcutaneous Lovenox        Subjective: Patient is seen and examined today morning.  He is lying comfortably. Denies any complaints, weak. Patient is on 9 L FNTC supplemental oxygen. Wife at bedside. I discussed palliative/ comfort care only. Both seem to be interested.   Physical Exam: Vitals:   07/01/23 0744 07/01/23 1215 07/01/23 1536 07/01/23 1935  BP: 134/64 (!) 153/63 128/65 135/61  Pulse: 70  72 76 78  Resp: (!) 22  (!) 22 18 18   Temp: 98.6 F (37 C) 99 F (37.2 C) 98.1 F (36.7 C) 97.8 F (36.6 C)  TempSrc: Oral Oral  Oral  SpO2: 91% 97% 99% 93%  Weight:      Height:       General - Elderly obese Caucasian ill looking male, no respiratory distress HEENT - PERRLA, EOMI, atraumatic head, non tender sinuses. Lung -diffuse rales, rhonchi. Heart - S1, S2 heard, no murmurs, rubs, 1+ pedal edema Neuro - Alert, awake and oriented x 3, non focal exam. Skin - Warm and dry. Data Reviewed:     Latest Ref Rng & Units 07/01/2023    3:06 AM 06/30/2023    6:00 AM 06/29/2023    3:35 AM  CBC  WBC 4.0 - 10.5 K/uL 5.5  5.1  5.7   Hemoglobin 13.0 - 17.0 g/dL 8.8  8.4  8.4   Hematocrit 39.0 - 52.0 % 28.5  26.6  27.1   Platelets 150 - 400 K/uL 221  204  232        Latest Ref Rng & Units 07/01/2023    3:06 AM 06/30/2023    6:00 AM 06/29/2023    3:35 AM  BMP  Glucose 70 - 99 mg/dL 161  91  85   BUN 8 - 23 mg/dL 45  40  39   Creatinine 0.61 - 1.24 mg/dL 0.96  0.45  4.09   Sodium 135 - 145 mmol/L 144  142  144   Potassium 3.5 - 5.1 mmol/L 4.0  3.6  3.4   Chloride 98 - 111 mmol/L 104  102  103   CO2 22 - 32 mmol/L 30  29  29    Calcium 8.9 - 10.3 mg/dL 8.1  7.9  7.9    No results found.  Family Communication: Wife at bedside, updated regarding current care plan  Disposition: Status is: Inpatient Remains inpatient appropriate because: close monitoring of respiratory status as he is on 8-9L hfnc, input and output  Planned Discharge Destination: Home with Home Health    Time spent .  Author: Marcelino Duster, MD 07/01/2023 8:20 PM  For on call review [email or web address].

## 2023-07-01 NOTE — TOC Progression Note (Signed)
Transition of Care [facility]) - Progression Note    Patient Details  Name: Jeremy [name] MRN: [id number] Date of Birth: 07-Sep-1951  Transition of Care [facility]) CM/SW Contact  Margarito Liner, LCSW Phone Number: 07/01/2023, 12:37 PM  Clinical Narrative:   CSW met with patient and wife. They confirmed they want to return home at discharge. They are waiting on a palliative consult. Wife stated he might need a hoyer lift and hospital bed at discharge but is waiting on care plan before making that decision. Patient is on 4 L oxygen through [employer] at baseline. Wife can transport him home in their wheelchair-accessible [facility] Niece if he is able to sit in his wheelchair and get up using lift.    Barriers to Discharge: Continued Medical Work up  Expected Discharge Plan and Services     Post Acute Care Choice: Home Health, Skilled Nursing Facility Living arrangements for the past 2 months: Single Family Home                                       Social Determinants of Health (SDOH) Interventions SDOH Screenings   Food Insecurity: No Food Insecurity (06/21/2023)  Housing: Low Risk  (06/21/2023)  Transportation Needs: No Transportation Needs (06/21/2023)  Utilities: Not At Risk (06/21/2023)  Alcohol Screen: Low Risk  (11/16/2021)  Depression (PHQ2-9): Low Risk  (05/27/2023)  Financial Resource Strain: Low Risk  (11/19/2022)  Physical Activity: Insufficiently Active (11/19/2022)  Social Connections: Moderately Isolated (11/19/2022)  Stress: No Stress Concern Present (11/19/2022)  Tobacco Use: Medium Risk (06/21/2023)    Readmission Risk Interventions    11/25/2022   10:25 AM 02/04/2022    9:47 AM  Readmission Risk Prevention Plan  Transportation Screening Complete Complete  PCP or Specialist Appt within 3-5 Days Complete Complete  HRI or Home Care Consult Complete   Social Work Consult for Recovery Care Planning/Counseling Complete Complete  Palliative Care Screening Not Applicable Not Applicable   Medication Review [employer]) Complete Complete

## 2023-07-02 DIAGNOSIS — J9621 Acute and chronic respiratory failure with hypoxia: Secondary | ICD-10-CM | POA: Diagnosis not present

## 2023-07-02 DIAGNOSIS — J441 Chronic obstructive pulmonary disease with (acute) exacerbation: Secondary | ICD-10-CM | POA: Diagnosis not present

## 2023-07-02 DIAGNOSIS — I272 Pulmonary hypertension, unspecified: Secondary | ICD-10-CM | POA: Diagnosis not present

## 2023-07-02 DIAGNOSIS — I5033 Acute on chronic diastolic (congestive) heart failure: Secondary | ICD-10-CM | POA: Diagnosis not present

## 2023-07-02 LAB — BASIC METABOLIC PANEL
Anion gap: 10 (ref 5–15)
BUN: 35 mg/dL — ABNORMAL HIGH (ref 8–23)
CO2: 28 mmol/L (ref 22–32)
Calcium: 7.9 mg/dL — ABNORMAL LOW (ref 8.9–10.3)
Chloride: 105 mmol/L (ref 98–111)
Creatinine, Ser: 1.26 mg/dL — ABNORMAL HIGH (ref 0.61–1.24)
GFR, Estimated: >60 mL/min (ref >60)
Glucose, Bld: 78 mg/dL (ref 70–99)
Potassium: 3.2 mmol/L — ABNORMAL LOW (ref 3.5–5.1)
Sodium: 143 mmol/L (ref 135–145)

## 2023-07-02 LAB — GLUCOSE, CAPILLARY
Glucose-Capillary: 79 mg/dL (ref 70–99)
Glucose-Capillary: 170 mg/dL — ABNORMAL HIGH (ref 70–99)
Glucose-Capillary: 150 mg/dL — ABNORMAL HIGH (ref 70–99)
Glucose-Capillary: 155 mg/dL — ABNORMAL HIGH (ref 70–99)

## 2023-07-02 LAB — CBC
HCT: 28.1 % — ABNORMAL LOW (ref 39.0–52.0)
Hemoglobin: 8.8 g/dL — ABNORMAL LOW (ref 13.0–17.0)
MCH: 27.5 pg (ref 26.0–34.0)
MCHC: 31.3 g/dL (ref 30.0–36.0)
MCV: 87.8 fL (ref 80.0–100.0)
Platelets: 198 10*3/uL (ref 150–400)
RBC: 3.2 MIL/uL — ABNORMAL LOW (ref 4.22–5.81)
RDW: 18 % — ABNORMAL HIGH (ref 11.5–15.5)
WBC: 5.8 10*3/uL (ref 4.0–10.5)
nRBC: 0 % (ref 0.0–0.2)

## 2023-07-02 LAB — BODY FLUID CULTURE W GRAM STAIN
Culture: NO GROWTH
Gram Stain: NONE SEEN

## 2023-07-02 MED ORDER — POTASSIUM CHLORIDE CRYS ER 20 MEQ PO TBCR
40.0000 meq | EXTENDED_RELEASE_TABLET | Freq: Once | ORAL | Status: AC
  Administered 2023-07-02: 40 meq via ORAL
  Filled 2023-07-02: qty 2

## 2023-07-02 NOTE — Progress Notes (Signed)
[facility] Palliative Care Referral  New referral for palliative care at patient's home upon discharge from [facility]. Spoke with patient and his wife to discuss palliative care services.  Both in agreement and understand palliative care can exist with home health.  Patient has been receiving OT and PT at home prior to hospitalization.    Address verified and correct per hospital records. Plan for discharge tentatively on Monday.  Thank you for allowing participation in this patient's care.  Norris Cross, RN Nurse Liaison  [phone]

## 2023-07-02 NOTE — Plan of Care (Signed)
  Problem: Safety: Goal: Ability to remain free from injury will improve Outcome: Progressing   Problem: Pain Managment: Goal: General experience of comfort will improve Outcome: Progressing   Problem: Elimination: Goal: Will not experience complications related to bowel motility Outcome: Progressing Goal: Will not experience complications related to urinary retention Outcome: Progressing   Problem: Clinical Measurements: Goal: Ability to maintain clinical measurements within normal limits will improve Outcome: Progressing Goal: Will remain free from infection Outcome: Progressing Goal: Diagnostic test results will improve Outcome: Progressing Goal: Respiratory complications will improve Outcome: Progressing Goal: Cardiovascular complication will be avoided Outcome: Progressing

## 2023-07-02 NOTE — Progress Notes (Signed)
[facility]  ROUNDING NOTE   Subjective:   Jeremy [name] is a 72 year old male with past medical conditions including hyperlipidemia, diabetes, hypertension, respiratory failure, COPD with 4 L nasal cannula at baseline, depression, diastolic heart failure, multiple sclerosis, liver cirrhosis, anemia, pulmonary hypertension, and chronic kidney disease stage IIIa.  Patient presents to the emergency department complaining of shortness of breath and has been admitted for Acute on chronic respiratory failure with hypoxia [facility]) [J96.21]  Patient is known to our practice from outpatient follow-up.  Patient was last seen in office on 07/01/2022 for routine follow-up.    Patient seen sitting up in bed Wife at bedside Oxygen weaned to 4 L high flow nasal cannula Edema greatly improved  Creatinine 1.26 Urine output 1.8 L in preceding 24 hours.  Objective:  Vital signs in last 24 hours:  Temp:  [97.6 F (36.4 C)-98.9 F (37.2 C)] 98.9 F (37.2 C) (09/07 1223) Pulse Rate:  [62-78] 70 (09/07 1223) Resp:  [16-20] 16 (09/07 1223) BP: (111-156)/(45-69) 156/69 (09/07 1223) SpO2:  [69 %-99 %] 91 % (09/07 1223) Weight:  [94.7 kg] 94.7 kg (09/07 0356)  Weight change: 2.07 kg Filed Weights   06/29/23 0500 07/01/23 0309 07/02/23 0356  Weight: 97.8 kg 92.6 kg 94.7 kg    Intake/Output: I/O last 3 completed shifts: In: 1436.3 [P.O.:630; IV Piggyback:806.3] Out: 2500 [Urine:2500]   Intake/Output this shift:  No intake/output data recorded.  Physical Exam: General: NAD  Head: Normocephalic, atraumatic. Moist oral mucosal membranes  Eyes: Anicteric  Lungs:  Fine crackles, Quinter, productive cough  Heart: Regular rate and rhythm  Abdomen:  Soft, nontender, nondistended  Extremities: + dependent peripheral edema.  Neurologic: Alert and oriented, able to follow simple commands  Skin: No lesions       Basic Metabolic Panel: Recent Labs  Lab 06/26/23 0516 06/27/23 0308  06/28/23 0441 06/29/23 0335 06/30/23 0600 07/01/23 0306 07/02/23 0709  NA 136 140 144 144 142 144 143  K 3.1* 3.4* 3.2* 3.4* 3.6 4.0 3.2*  CL 102 102 106 103 102 104 105  CO2 26 28 30 29 29 30 28   GLUCOSE 83 111* 94 85 91 104* 78  BUN 52* 46* 41* 39* 40* 45* 35*  CREATININE 1.26* 1.25* 1.16 1.33* 1.23 1.41* 1.26*  CALCIUM 7.8* 8.1* 8.1* 7.9* 7.9* 8.1* 7.9*  MG 1.6* 1.9 1.9 1.6* 2.1  --   --   PHOS 2.9 2.5 3.2 3.2 3.3  --   --     Liver Function Tests: Recent Labs  Lab 06/26/23 0516 06/27/23 0308  AST 18 21  ALT 12 13  ALKPHOS 58 66  BILITOT 1.0 1.3*  PROT 5.1* 6.2*  ALBUMIN 2.3* 2.7*   No results for input(s): "LIPASE", "AMYLASE" in the last 168 hours. No results for input(s): "AMMONIA" in the last 168 hours.   CBC: Recent Labs  Lab 06/28/23 0441 06/28/23 1603 06/29/23 0335 06/30/23 0600 07/01/23 0306 07/02/23 0709  WBC 6.2  --  5.7 5.1 5.5 5.8  HGB 8.5* 9.6* 8.4* 8.4* 8.8* 8.8*  HCT 26.6* 30.5* 27.1* 26.6* 28.5* 28.1*  MCV 88.1  --  88.3 86.4 88.8 87.8  PLT 238  --  232 204 221 198    Cardiac Enzymes: No results for input(s): "CKTOTAL", "CKMB", "CKMBINDEX", "TROPONINI" in the last 168 hours.  BNP: Invalid input(s): "POCBNP"  CBG: Recent Labs  Lab 07/01/23 1215 07/01/23 1537 07/01/23 2135 07/02/23 0800 07/02/23 1214  GLUCAP 110* 147* 126* 79 170*  Microbiology: Results for orders placed or performed during the hospital encounter of 06/21/23  Culture, blood (Routine x 2)     Status: None   Collection Time: 06/21/23  3:02 PM   Specimen: BLOOD  Result Value Ref Range Status   Specimen Description BLOOD LEFT ANTECUBITAL  Final   Special Requests   Final    BOTTLES DRAWN AEROBIC AND ANAEROBIC Blood Culture adequate volume   Culture   Final    NO GROWTH 5 DAYS Performed at [facility], [address] [place], [place] [address]    Report Status 06/26/2023 FINAL  Final  Culture, blood (Routine x 2)     Status: None   Collection  Time: 06/21/23  3:13 PM   Specimen: BLOOD  Result Value Ref Range Status   Specimen Description BLOOD RIGHT ANTECUBITAL  Final   Special Requests   Final    BOTTLES DRAWN AEROBIC AND ANAEROBIC Blood Culture adequate volume   Culture   Final    NO GROWTH 5 DAYS Performed at [facility], [address] [place], [place] [address]    Report Status 06/26/2023 FINAL  Final  Resp panel by RT-PCR (RSV, Flu A&B, Covid) Anterior Nasal Swab     Status: None   Collection Time: 06/21/23  4:35 PM   Specimen: Anterior Nasal Swab  Result Value Ref Range Status   SARS Coronavirus 2 by RT PCR NEGATIVE NEGATIVE Final    Comment: (NOTE) SARS-CoV-2 target nucleic acids are NOT DETECTED.  The SARS-CoV-2 RNA is generally detectable in upper respiratory specimens during the acute phase of infection. The lowest concentration of SARS-CoV-2 viral copies this assay can detect is 138 copies/mL. A negative result does not preclude SARS-Cov-2 infection and should not be used as the sole basis for treatment or other patient management decisions. A negative result may occur with  improper specimen collection/handling, submission of specimen other than nasopharyngeal swab, presence of viral mutation(s) within the areas targeted by this assay, and inadequate number of viral copies(<138 copies/mL). A negative result must be combined with clinical observations, patient history, and epidemiological information. The expected result is Negative.  Fact Sheet for Patients:  BloggerCourse.com  Fact Sheet for Healthcare Providers:  SeriousBroker.it  This test is no t yet approved or cleared by the [place] FDA and  has been authorized for detection and/or diagnosis of SARS-CoV-2 by FDA under an Emergency Use Authorization (EUA). This EUA will remain  in effect (meaning this test can be used) for the duration of the COVID-19 declaration under Section  564(b)(1) of the Act, 21 U.S.C.section 360bbb-3(b)(1), unless the authorization is terminated  or revoked sooner.       Influenza A by PCR NEGATIVE NEGATIVE Final   Influenza B by PCR NEGATIVE NEGATIVE Final    Comment: (NOTE) The Xpert Xpress SARS-CoV-2/FLU/RSV plus assay is intended as an aid in the diagnosis of influenza from Nasopharyngeal swab specimens and should not be used as a sole basis for treatment. Nasal washings and aspirates are unacceptable for Xpert Xpress SARS-CoV-2/FLU/RSV testing.  Fact Sheet for Patients: BloggerCourse.com  Fact Sheet for Healthcare Providers: SeriousBroker.it  This test is not yet approved or cleared by the [place] FDA and has been authorized for detection and/or diagnosis of SARS-CoV-2 by FDA under an Emergency Use Authorization (EUA). This EUA will remain in effect (meaning this test can be used) for the duration of the COVID-19 declaration under Section 564(b)(1) of the Act, 21 U.S.C. section 360bbb-3(b)(1), unless  the authorization is terminated or revoked.     Resp Syncytial Virus by PCR NEGATIVE NEGATIVE Final    Comment: (NOTE) Fact Sheet for Patients: BloggerCourse.com  Fact Sheet for Healthcare Providers: SeriousBroker.it  This test is not yet approved or cleared by the [place] FDA and has been authorized for detection and/or diagnosis of SARS-CoV-2 by FDA under an Emergency Use Authorization (EUA). This EUA will remain in effect (meaning this test can be used) for the duration of the COVID-19 declaration under Section 564(b)(1) of the Act, 21 U.S.C. section 360bbb-3(b)(1), unless the authorization is terminated or revoked.  Performed at [facility], [address] [place], [place] [address]   MRSA Next Gen by PCR, Nasal     Status: Abnormal   Collection Time: 06/21/23  8:14 PM   Specimen: Nasal  Mucosa; Nasal Swab  Result Value Ref Range Status   MRSA by PCR Next Gen DETECTED (A) NOT DETECTED Final    Comment: RESULT CALLED TO, READ BACK BY AND VERIFIED WITH: KRISTY SNEEAD AT 2206 ON 06/21/23 BY SS (NOTE) The GeneXpert MRSA Assay (FDA approved for NASAL specimens only), is one component of a comprehensive MRSA colonization surveillance program. It is not intended to diagnose MRSA infection nor to guide or monitor treatment for MRSA infections. Test performance is not FDA approved in patients less than 81 years old. Performed at [facility], [address] [place], [place] [address]   Respiratory (~20 pathogens) panel by PCR     Status: None   Collection Time: 06/22/23  3:22 PM   Specimen: Nasopharyngeal Swab; Respiratory  Result Value Ref Range Status   Adenovirus NOT DETECTED NOT DETECTED Final   Coronavirus 229E NOT DETECTED NOT DETECTED Final    Comment: (NOTE) The Coronavirus on the Respiratory Panel, DOES NOT test for the novel  Coronavirus (2019 nCoV)    Coronavirus HKU1 NOT DETECTED NOT DETECTED Final   Coronavirus NL63 NOT DETECTED NOT DETECTED Final   Coronavirus OC43 NOT DETECTED NOT DETECTED Final   Metapneumovirus NOT DETECTED NOT DETECTED Final   Rhinovirus / Enterovirus NOT DETECTED NOT DETECTED Final   Influenza A NOT DETECTED NOT DETECTED Final   Influenza B NOT DETECTED NOT DETECTED Final   Parainfluenza Virus 1 NOT DETECTED NOT DETECTED Final   Parainfluenza Virus 2 NOT DETECTED NOT DETECTED Final   Parainfluenza Virus 3 NOT DETECTED NOT DETECTED Final   Parainfluenza Virus 4 NOT DETECTED NOT DETECTED Final   Respiratory Syncytial Virus NOT DETECTED NOT DETECTED Final   Bordetella pertussis NOT DETECTED NOT DETECTED Final   Bordetella Parapertussis NOT DETECTED NOT DETECTED Final   Chlamydophila pneumoniae NOT DETECTED NOT DETECTED Final   Mycoplasma pneumoniae NOT DETECTED NOT DETECTED Final    Comment: Performed at [facility], [address], [place], [place] [address]  Body fluid culture w Gram Stain     Status: None   Collection Time: 06/24/23 12:12 PM   Specimen: PATH Cytology Peritoneal fluid  Result Value Ref Range Status   Specimen Description   Final    PERITONEAL Performed at [facility], [address], [place], [place] [address]    Special Requests   Final    NONE Performed at [facility], [address] [place], [place] [address]    Gram Stain NO WBC SEEN NO ORGANISMS SEEN   Final   Culture   Final    NO GROWTH 3 DAYS Performed at [facility], [address], [place], [place] [address]    Report Status 06/27/2023 FINAL  Final  MRSA Next Gen by PCR, Nasal     Status: Abnormal   Collection Time: 06/26/23  5:43 PM   Specimen: Nasal Mucosa; Nasal Swab  Result Value Ref Range Status   MRSA by PCR Next Gen DETECTED (A) NOT DETECTED Final    Comment: RESULT CALLED TO, READ BACK BY AND VERIFIED WITH: [facility] WHITE 06/26/2023 2055 CP (NOTE) The GeneXpert MRSA Assay (FDA approved for NASAL specimens only), is one component of a comprehensive MRSA colonization surveillance program. It is not intended to diagnose MRSA infection nor to guide or monitor treatment for MRSA infections. Test performance is not FDA approved in patients less than 62 years old. Performed at [facility], [address] [place], [place] [address]   Body fluid culture w Gram Stain     Status: None   Collection Time: 06/28/23 12:25 PM   Specimen: PATH Cytology Pleural fluid  Result Value Ref Range Status   Specimen Description   Final    PLEURAL Performed at [facility], [address], [place], [place] [address]    Special Requests   Final    NONE Performed at [facility], [address] [place], [place] [address]    Gram Stain NO WBC SEEN NO ORGANISMS SEEN   Final   Culture   Final    NO GROWTH 3 DAYS Performed at [facility], [address], [place], [place] [address]    Report Status 07/02/2023 FINAL  Final    Coagulation Studies: No results for input(s): "LABPROT", "INR" in the last 72 hours.   Urinalysis: No results for input(s): "COLORURINE", "LABSPEC", "PHURINE", "GLUCOSEU", "HGBUR", "BILIRUBINUR", "KETONESUR", "PROTEINUR", "UROBILINOGEN", "NITRITE", "LEUKOCYTESUR" in the last 72 hours.  Invalid input(s): "APPERANCEUR"    Imaging: No results found.   Medications:    sodium chloride Stopped (06/27/23 2352)   ampicillin-sulbactam (UNASYN) IV 3 g (07/02/23 1013)    vitamin C  500 mg Oral Daily   doxycycline  100 mg Oral Q12H   fluticasone  1 spray Each Nare Daily   folic acid  1 mg Oral Daily   furosemide  20 mg Oral Daily   gemfibrozil  600 mg Oral BID AC   guaiFENesin  600 mg Oral BID   insulin aspart  0-5 Units Subcutaneous QHS   insulin aspart  0-9 Units Subcutaneous TID WC   insulin glargine-yfgn  7 Units Subcutaneous QHS   Interferon Beta-1b  0.25 mg Subcutaneous QODAY   iron polysaccharides  150 mg Oral Daily   loratadine  10 mg Oral Daily   nystatin   Topical TID   mouth rinse  15 mL Mouth Rinse 4 times per day   pantoprazole  40 mg Oral BID   pregabalin  200 mg Oral BID   sertraline  25 mg Oral Daily   simvastatin  40 mg Oral q1800   tamsulosin  0.4 mg Oral QPC supper   Vitamin D (Ergocalciferol)  50,000 Units Oral Q7 days   sodium chloride, acetaminophen, albuterol, guaiFENesin-dextromethorphan, hydrALAZINE, ondansetron (ZOFRAN) IV, mouth rinse, traMADol  Assessment/ Plan:  [name] is a 72 y.o.  male with past medical conditions including hyperlipidemia, diabetes, hypertension, respiratory failure, COPD with 4 L nasal cannula at baseline, depression, diastolic heart failure, multiple sclerosis, liver cirrhosis, anemia, pulmonary hypertension, and chronic kidney disease stage IIIa.  Patient presents to the emergency  department complaining of shortness of breath and has been admitted  for Acute on chronic respiratory failure with hypoxia (HCC) [J96.21]   Chronic kidney disease stage 2,. Chronic kidney disease is secondary to diabetes, hypertension, advanced age -Continue oral furosemide 20 mg daily. - Continues to achieve adequate diuresis - Renal function has returned to baseline  Lab Results  Component Value Date   CREATININE 1.26 (H) 07/02/2023   CREATININE 1.41 (H) 07/01/2023   CREATININE 1.23 06/30/2023    Intake/Output Summary (Last 24 hours) at 07/02/2023 1311 Last data filed at 07/02/2023 0630 Gross per 24 hour  Intake 1196.27 ml  Output 1000 ml  Net 196.27 ml   2.  Volume overload, appears multifactorial with contributing factors of COPD   CT angiogram from 06/26/2023-large right pleural effusion. -Generalized edema greatly improved.  Continue current diuresis therapies. Right thoracentesis done on 06/28/2023.  400 cc fluid removed.  3. Anemia of chronic kidney disease Lab Results  Component Value Date   HGB 8.8 (L) 07/02/2023    Patient has received blood transfusion during this admission on 06/23/2023.  Hemoglobin 8.8.  4.  Diabetes mellitus type II with chronic kidney disease/renal manifestations: insulin dependent. Home regimen includes Lantus. Most recent hemoglobin A1c is 4.4 on 11/24/22.   Primary team to continue management of sliding scale insulin.   LOS: 11 Danaisha Celli 9/7/20241:11 PM

## 2023-07-02 NOTE — Progress Notes (Signed)
Progress Note   Patient: Jeremy [name] ZOX:[id number] DOB: October 18, 1951 DOA: 06/21/2023     11 DOS: the patient was seen and examined on 07/02/2023   Brief hospital course: Yorman Crase is a 72 y.o. male with medical history significant of HTN, HLD, DM, chronic respiratory failure, COPD on 4L O2, dCHF, GIB, depression, MS (wheelchair-bound), pulmonary hypertension, chronic pain, liver cirrhosis, obesity, anemia, who presents with SOB.   Patient was recently hospitalized from 7/12 - 7/26 due to CHF exacerbation and right lobar pneumonia. Patient usually on 4 L supplemental oxygen is currently requiring 8 L.  Patient has been having worsening shortness of breath and leg swelling with 13lb  weight gain.  Patient was started on IV Lasix, IV Solu-Medrol and nebulizer therapy along with IV antibiotics. Oxygen gradually weaned off. He will need PT evaluation, palliative care follow up and disposition plan.   Assessment and Plan: Acute on chronic hypoxic respiratory failure most likely due to CHF exacerbation, Multifocal pneumonia  Patient uses 4 L oxygen at baseline currently on 8-9 L.  Continue to wean oxygen as tolerated to maintain o2 saturation around 92%. CT chest: Bilateral lung collapse, multifocal pneumonia, bilateral effusion R >L. 9/1 started Unasyn, pharmacy consulted and doxycycline 100 mg IV twice daily for aspiration PNA and to cover MRSA as well. Total duration of antibiotics 7 days. 9/2 patient's respiratory status got worse on 9/1, moved to stepdown unit, PCCM on board. Encourage incentive spirometry. 9/5 moved out of step down, still on 8L Advised RN to wean O2 to 4L and maintain 90-92% saturation. Continue oral Lasix for diuresis.   Diastolic CHF exacerbation and anasarca with significant lower extremity edema Patient presented with shortness of breath and LE edema S/p Lasix 40 mg IV twice daily transitioned to IV infusion changed to oral 20mg  daily per nephrology. S/p albumin  infusions Continue to aim for net negative output. 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  As per patient's wife patient cannot have more procedures done due to respiratory failure and he may not wake up.  Patient and his wife would prefer conservative management.  8/30 Hb 6.9, 1 unit PRBC transfused. Hb around 8 and stable.  Stage 3a chronic kidney disease  Recent baseline 0.90 1.2-1.4.   creatinine 1.41 stable on current diuresis Monitor renal function and urine output S/p IV albumin as above Nephrology on board, follow further recommendations.   COPD exacerbation, most likely due to volume overload s/p Solu-Medrol 40 mg IV BID, DC'd on 8/29  S/p  doxycycline 100 mg p.o. twice daily d/c'd on 8/30 Started Mucinex 600 mg p.o. twice daily Continue Robitussin DM as needed   Pleural effusion Moderate right pleural effusion most likely due to CHF, liver cirrhosis with hypoalbuminemia. Continue diuresis as above 8/30 s/p thoracentesis 1.2 L fluid was tapped by IR, postprocedure x-ray did not show any complications. Fluid studies, as per lights criteria, LDH elevated, consistent with exudative but clinically no signs of infection. Started Abx due to CT scan findings of multifocal pneumonia, bilateral effusion, right is moderate. 9/3 s/p repeat right thoracentesis, 400 mL fluid was removed.   Type  2 diabetes mellitus- Continue Semglee 7 units nightly Accucheks with sliding scale insulin. Continue diabetic diet Hypoglycemia protocol.   Essential hypertension IV hydralazine as needed Continue  IV Lasix   Hyperlipidemia Zocor and lopid   Multiple sclerosis (HCC) pt is on Betaseron injection Fall precautions   Myocardial injury:  troponin level 21 --> -20.   No chest pain Continue Zocor   Folic acid deficiency, folate level 5.2 Daily folate supplement.   Hypokalemia-  secondary to diuresis, potassium repleted. Monitor electrolytes daily  Hypomagnesemia- Replete as needed and recheck.  Hypotonic hyponatremia: Resolved He got Sodium chloride 2 g p.o. 3 times daily for 5 days Continue fluid restriction 1200 ml/d Na stable at 144   Cirrhosis of liver without ascites (HCC): Mental status normal.  INR 1.3 Ammonia level 26 wnl   Chronic urinary retention: Foley removed , he is able to void. He is at high risk of retention. Bladder scan per nursing protocol. Continue Flomax. If persistent retention, replace foley with outpatient urology follow up.                                                                                                                                                                                                                                                                                                               Depression Continue home medication, Zoloft   Vitamin D deficiency: started vitamin D 50,000 units p.o. weekly, follow with PCP to repeat vitamin D level after 3 to 6 months.   Obesity Body mass index is 30.68 kg/m.  Diet, exercise and weight reduction advised.   Diet: Heart healthy/carb modified, fluid striction 1200 mL/day DVT Prophylaxis: Subcutaneous Lovenox  Palliative team on board. He wished for DNR- limited intervention. He is leaning towards comfort measure only, and would like to talk to palliative team. PMT aware. He wishes to [facility] and refuses SNF placement. Wife plan to make arrangement at home for discharge.       Subjective: Patient is seen and examined today morning.  He did sit on edge of  bed. Currently on 4L supplemental oxygen. Wishes to talk to palliative. Wife would like to make arrangements for discharge.  Physical Exam: Vitals:   07/02/23 0759 07/02/23 0817 07/02/23 1223 07/02/23 1608  BP: 132/63  (!) 156/69 (!) 146/70  Pulse: 72 65 70 73  Resp:  20 16 19   Temp: 98.1 F (36.7 C)  98.9 F (37.2 C) 98.3 F (36.8 C)  TempSrc:      SpO2: (!) 69% 95% 91% 94%  Weight:      Height:       General - Elderly obese Caucasian ill looking male, mild respiratory distress HEENT - PERRLA, EOMI, atraumatic head, non tender sinuses. Lung -diffuse rales, rhonchi. Heart - S1, S2 heard, no murmurs, rubs, 1+ pedal edema Neuro - Alert, awake and oriented x 3, non focal exam. Skin - Warm and dry. Data Reviewed:     Latest Ref Rng & Units 07/02/2023    7:09 AM 07/01/2023    3:06 AM 06/30/2023    6:00 AM  CBC  WBC 4.0 - 10.5 K/uL 5.8  5.5  5.1   Hemoglobin 13.0 - 17.0 g/dL 8.8  8.8  8.4   Hematocrit 39.0 - 52.0 % 28.1  28.5  26.6   Platelets 150 - 400 K/uL 198  221  204        Latest Ref Rng & Units 07/02/2023    7:09 AM 07/01/2023    3:06 AM 06/30/2023    6:00 AM  BMP  Glucose 70 - 99 mg/dL 78  284  91   BUN 8 - 23 mg/dL 35  45  40   Creatinine 0.61 - 1.24 mg/dL 1.32  4.40  1.02   Sodium 135 - 145 mmol/L 143  144  142   Potassium 3.5 - 5.1 mmol/L 3.2  4.0  3.6   Chloride 98 - 111 mmol/L 105  104  102   CO2 22 - 32 mmol/L 28  30  29    Calcium 8.9 - 10.3 mg/dL 7.9  8.1  7.9    No results found.  Family Communication: Wife at bedside, updated regarding current care plan  Disposition: Status is: Inpatient Remains inpatient appropriate because: close monitoring of respiratory status, input and output  Planned Discharge Destination: Home with Home Health, pending palliative discussion.    Time spent .  Author: Marcelino Duster, MD 07/02/2023 4:23 PM  For on call review [email or web address].

## 2023-07-02 NOTE — Consult Note (Signed)
PHARMACY CONSULT NOTE - ELECTROLYTES  Pharmacy Consult for Electrolyte Monitoring and Replacement   Recent Labs: Height: 5\' 10"  (177.8 cm) Weight: 94.7 kg (208 lb 12.4 oz) IBW/kg (Calculated) : 73 Estimated Creatinine Clearance: 62.1 mL/min (A) (by C-G formula based on SCr of 1.26 mg/dL (H)). Potassium (mmol/L)  Date Value  07/02/2023 3.2 (L)  08/31/2013 3.5   Magnesium (mg/dL)  Date Value  74/25/9563 2.1   Calcium (mg/dL)  Date Value  87/56/4332 7.9 (L)   Calcium, Total (mg/dL)  Date Value  95/18/8416 8.9   Albumin (g/dL)  Date Value  60/63/0160 2.7 (L)  02/15/2022 3.6 (L)  08/31/2013 2.9 (L)   Phosphorus (mg/dL)  Date Value  10/93/2355 3.3  08/28/2013 4.9   Sodium (mmol/L)  Date Value  07/02/2023 143  02/15/2022 138  08/31/2013 143   Assessment  Jeremy [name] is a 72 y.o. male presenting with pneumonia. PMH significant for HTN, HLD, DM, chronic respiratory failure / COPD on 4L O2, diastolic CHF, GIB, depression, MS (wheel-chair bound), pulmonary HTN, chronic pain, liver cirrhosis, obesity, anemia. CT chest revealed features compatible with multifocal pneumonia. Pharmacy has been consulted to monitor and replace electrolytes.  Diet: heart healthy/carb modified - aspiration precautions MIVF: n/a Pertinent medications: lasix 20 mg PO daily.   Goal of Therapy: Electrolytes WNL  Plan:  Kcl 40 mEq x 1 F/u with AM labs.   Thank you for allowing pharmacy to be a part of this patient's care.  Paschal Dopp, PharmD, BCPS 07/02/2023 9:58 AM

## 2023-07-02 NOTE — Plan of Care (Signed)
  Problem: Education: Goal: Ability to describe self-care measures that may prevent or decrease complications (Diabetes Survival Skills Education) will improve Outcome: Progressing   Problem: Coping: Goal: Ability to adjust to condition or change in health will improve Outcome: Progressing   Problem: Health Behavior/Discharge Planning: Goal: Ability to identify and utilize available resources and services will improve Outcome: Progressing Goal: Ability to manage health-related needs will improve Outcome: Progressing   Problem: Nutritional: Goal: Maintenance of adequate nutrition will improve Outcome: Progressing Goal: Progress toward achieving an optimal weight will improve Outcome: Progressing   Problem: Skin Integrity: Goal: Risk for impaired skin integrity will decrease Outcome: Progressing   Problem: Tissue Perfusion: Goal: Adequacy of tissue perfusion will improve Outcome: Progressing   Problem: Education: Goal: Ability to demonstrate management of disease process will improve Outcome: Progressing Goal: Ability to verbalize understanding of medication therapies will improve Outcome: Progressing   Problem: Activity: Goal: Capacity to carry out activities will improve Outcome: Progressing

## 2023-07-02 NOTE — TOC Progression Note (Signed)
Transition of Care [facility]) - Progression Note    Patient Details  Name: Jeremy [name] MRN: [id number] Date of Birth: Jul 28, 1951  [facility]) CM/SW Contact  Bing Quarry, RN Phone Number: 07/02/2023, 3:44 PM  Clinical Narrative:  07/02/23: Sherron Monday with spouse regarding outpatient palliative following on discharge. Spouse wanted [facility] as  outpatient palliative choice after RN CM explained offering patient/family choices. She wishes to speak to someone available today. [facility] liaison notified and spouse informed she may be contacted in person or by phone initially. She verbalized understanding of this.   Gabriel Cirri MSN RN CM  Transitions of Care Department [facility] [phone] Weekends Only        Barriers to Discharge: Continued Medical Work up  Expected Discharge Plan and Services     Post Acute Care Choice: Home Health, Skilled Nursing Facility Living arrangements for the past 2 months: Single Family Home                                       Social Determinants of Health (SDOH) Interventions SDOH Screenings   Food Insecurity: No Food Insecurity (06/21/2023)  Housing: Low Risk  (06/21/2023)  Transportation Needs: No Transportation Needs (06/21/2023)  Utilities: Not At Risk (06/21/2023)  Alcohol Screen: Low Risk  (11/16/2021)  Depression (PHQ2-9): Low Risk  (05/27/2023)  Financial Resource Strain: Low Risk  (11/19/2022)  Physical Activity: Insufficiently Active (11/19/2022)  Social Connections: Moderately Isolated (11/19/2022)  Stress: No Stress Concern Present (11/19/2022)  Tobacco Use: Medium Risk (06/21/2023)    Readmission Risk Interventions    11/25/2022   10:25 AM 02/04/2022    9:47 AM  Readmission Risk Prevention Plan  Transportation Screening Complete Complete  PCP or Specialist Appt within 3-5 Days Complete Complete  HRI or Home Care Consult Complete   Social Work Consult for Recovery Care Planning/Counseling Complete Complete  Palliative  Care Screening Not Applicable Not Applicable  Medication Review Oceanographer) Complete Complete

## 2023-07-03 DIAGNOSIS — I5033 Acute on chronic diastolic (congestive) heart failure: Secondary | ICD-10-CM | POA: Diagnosis not present

## 2023-07-03 DIAGNOSIS — J441 Chronic obstructive pulmonary disease with (acute) exacerbation: Secondary | ICD-10-CM | POA: Diagnosis not present

## 2023-07-03 DIAGNOSIS — I272 Pulmonary hypertension, unspecified: Secondary | ICD-10-CM | POA: Diagnosis not present

## 2023-07-03 DIAGNOSIS — J9621 Acute and chronic respiratory failure with hypoxia: Secondary | ICD-10-CM | POA: Diagnosis not present

## 2023-07-03 LAB — GLUCOSE, CAPILLARY
Glucose-Capillary: 82 mg/dL (ref 70–99)
Glucose-Capillary: 110 mg/dL — ABNORMAL HIGH (ref 70–99)
Glucose-Capillary: 128 mg/dL — ABNORMAL HIGH (ref 70–99)
Glucose-Capillary: 112 mg/dL — ABNORMAL HIGH (ref 70–99)

## 2023-07-03 MED ORDER — LOPERAMIDE HCL 2 MG PO CAPS
2.0000 mg | ORAL_CAPSULE | ORAL | Status: DC | PRN
  Administered 2023-07-03 (×2): 2 mg via ORAL
  Filled 2023-07-03 (×2): qty 1

## 2023-07-03 MED ORDER — RISAQUAD PO CAPS
1.0000 | ORAL_CAPSULE | Freq: Every day | ORAL | Status: DC
  Administered 2023-07-03 – 2023-07-05 (×3): 1 via ORAL
  Filled 2023-07-03 (×3): qty 1

## 2023-07-03 MED ORDER — BIOGAIA PROBIOTIC PO LIQD: 5.0000 [drp] | Freq: Every day | ORAL | Status: DC

## 2023-07-03 NOTE — Plan of Care (Signed)
Problem: Education: Goal: Ability to describe self-care measures that may prevent or decrease complications (Diabetes Survival Skills Education) will improve Outcome: Progressing Goal: Individualized Educational Video(s) Outcome: Progressing   Problem: Coping: Goal: Ability to adjust to condition or change in health will improve Outcome: Progressing   Problem: Fluid Volume: Goal: Ability to maintain a balanced intake and output will improve Outcome: Progressing   Problem: Health Behavior/Discharge Planning: Goal: Ability to identify and utilize available resources and services will improve Outcome: Progressing Goal: Ability to manage health-related needs will improve Outcome: Progressing   Problem: Metabolic: Goal: Ability to maintain appropriate glucose levels will improve Outcome: Progressing   Problem: Nutritional: Goal: Maintenance of adequate nutrition will improve Outcome: Progressing Goal: Progress toward achieving an optimal weight will improve Outcome: Progressing   Problem: Skin Integrity: Goal: Risk for impaired skin integrity will decrease Outcome: Progressing   Problem: Tissue Perfusion: Goal: Adequacy of tissue perfusion will improve Outcome: Progressing   Problem: Education: Goal: Ability to demonstrate management of disease process will improve Outcome: Progressing Goal: Ability to verbalize understanding of medication therapies will improve Outcome: Progressing Goal: Individualized Educational Video(s) Outcome: Progressing   Problem: Activity: Goal: Capacity to carry out activities will improve Outcome: Progressing   Problem: Cardiac: Goal: Ability to achieve and maintain adequate cardiopulmonary perfusion will improve Outcome: Progressing   Problem: Education: Goal: Knowledge of disease or condition will improve Outcome: Progressing Goal: Knowledge of the prescribed therapeutic regimen will improve Outcome: Progressing Goal:  Individualized Educational Video(s) Outcome: Progressing   Problem: Activity: Goal: Ability to tolerate increased activity will improve Outcome: Progressing Goal: Will verbalize the importance of balancing activity with adequate rest periods Outcome: Progressing   Problem: Respiratory: Goal: Ability to maintain a clear airway will improve Outcome: Progressing Goal: Levels of oxygenation will improve Outcome: Progressing Goal: Ability to maintain adequate ventilation will improve Outcome: Progressing   Problem: Activity: Goal: Ability to tolerate increased activity will improve Outcome: Progressing   Problem: Clinical Measurements: Goal: Ability to maintain a body temperature in the normal range will improve Outcome: Progressing   Problem: Respiratory: Goal: Ability to maintain adequate ventilation will improve Outcome: Progressing Goal: Ability to maintain a clear airway will improve Outcome: Progressing   Problem: Education: Goal: Knowledge of General Education information will improve Description: Including pain rating scale, medication(s)/side effects and non-pharmacologic comfort measures Outcome: Progressing   Problem: Health Behavior/Discharge Planning: Goal: Ability to manage health-related needs will improve Outcome: Progressing   Problem: Clinical Measurements: Goal: Ability to maintain clinical measurements within normal limits will improve Outcome: Progressing Goal: Will remain free from infection Outcome: Progressing Goal: Diagnostic test results will improve Outcome: Progressing Goal: Respiratory complications will improve Outcome: Progressing Goal: Cardiovascular complication will be avoided Outcome: Progressing   Problem: Activity: Goal: Risk for activity intolerance will decrease Outcome: Progressing   Problem: Nutrition: Goal: Adequate nutrition will be maintained Outcome: Progressing   Problem: Coping: Goal: Level of anxiety will  decrease Outcome: Progressing   Problem: Elimination: Goal: Will not experience complications related to bowel motility Outcome: Progressing Goal: Will not experience complications related to urinary retention Outcome: Progressing   Problem: Pain Managment: Goal: General experience of comfort will improve Outcome: Progressing   Problem: Safety: Goal: Ability to remain free from injury will improve Outcome: Progressing   Problem: Skin Integrity: Goal: Risk for impaired skin integrity will decrease 07/03/2023 2159 by Cameron Ali, RN Outcome: Progressing 07/03/2023 2159 by Cameron Ali, RN Outcome: Progressing   Problem: Education: Goal: Ability to  demonstrate management of disease process will improve Outcome: Progressing Goal: Ability to verbalize understanding of medication therapies will improve Outcome: Progressing Goal: Individualized Educational Video(s) Outcome: Progressing   Problem: Activity: Goal: Capacity to carry out activities will improve Outcome: Progressing   Problem: Cardiac: Goal: Ability to achieve and maintain adequate cardiopulmonary perfusion will improve Outcome: Progressing

## 2023-07-03 NOTE — Progress Notes (Signed)
[facility]  ROUNDING NOTE   Subjective:   Jeremy [name] is a 72 year old male with past medical conditions including hyperlipidemia, diabetes, hypertension, respiratory failure, COPD with 4 L nasal cannula at baseline, depression, diastolic heart failure, multiple sclerosis, liver cirrhosis, anemia, pulmonary hypertension, and chronic kidney disease stage IIIa.  Patient presents to the emergency department complaining of shortness of breath and has been admitted for Acute on chronic respiratory failure with hypoxia [facility]) [J96.21]  Patient is known to our practice from outpatient follow-up.  Patient was last seen in office on 07/01/2022 for routine follow-up.    Patient laying in bed Completed breakfast Wife at bedside Oxygen weaned to 3L   Urine output 1.2 L in preceding 24 hours.  Objective:  Vital signs in last 24 hours:  Temp:  [98.1 F (36.7 C)-98.9 F (37.2 C)] 98.1 F (36.7 C) (09/08 0801) Pulse Rate:  [70-77] 71 (09/08 0801) Resp:  [16-20] 18 (09/08 0801) BP: (139-156)/(60-73) 155/65 (09/08 0801) SpO2:  [91 %-94 %] 92 % (09/08 0801) Weight:  [95 kg] 95 kg (09/08 0605)  Weight change: 0.3 kg Filed Weights   07/01/23 0309 07/02/23 0356 07/03/23 0605  Weight: 92.6 kg 94.7 kg 95 kg    Intake/Output: I/O last 3 completed shifts: In: 1473.4 [P.O.:490; IV Piggyback:983.4] Out: 2200 [Urine:2200]   Intake/Output this shift:  No intake/output data recorded.  Physical Exam: General: NAD  Head: Normocephalic, atraumatic. Moist oral mucosal membranes  Eyes: Anicteric  Lungs:  Diminished, [place], productive cough  Heart: Regular rate and rhythm  Abdomen:  Soft, nontender, nondistended  Extremities: + dependent peripheral edema.  Neurologic: Alert and oriented, able to follow simple commands  Skin: No lesions       Basic Metabolic Panel: Recent Labs  Lab 06/27/23 0308 06/28/23 0441 06/29/23 0335 06/30/23 0600 07/01/23 0306 07/02/23 0709  NA 140 144  144 142 144 143  K 3.4* 3.2* 3.4* 3.6 4.0 3.2*  CL 102 106 103 102 104 105  CO2 28 30 29 29 30 28   GLUCOSE 111* 94 85 91 104* 78  BUN 46* 41* 39* 40* 45* 35*  CREATININE 1.25* 1.16 1.33* 1.23 1.41* 1.26*  CALCIUM 8.1* 8.1* 7.9* 7.9* 8.1* 7.9*  MG 1.9 1.9 1.6* 2.1  --   --   PHOS 2.5 3.2 3.2 3.3  --   --     Liver Function Tests: Recent Labs  Lab 06/27/23 0308  AST 21  ALT 13  ALKPHOS 66  BILITOT 1.3*  PROT 6.2*  ALBUMIN 2.7*   No results for input(s): "LIPASE", "AMYLASE" in the last 168 hours. No results for input(s): "AMMONIA" in the last 168 hours.   CBC: Recent Labs  Lab 06/28/23 0441 06/28/23 1603 06/29/23 0335 06/30/23 0600 07/01/23 0306 07/02/23 0709  WBC 6.2  --  5.7 5.1 5.5 5.8  HGB 8.5* 9.6* 8.4* 8.4* 8.8* 8.8*  HCT 26.6* 30.5* 27.1* 26.6* 28.5* 28.1*  MCV 88.1  --  88.3 86.4 88.8 87.8  PLT 238  --  232 204 221 198    Cardiac Enzymes: No results for input(s): "CKTOTAL", "CKMB", "CKMBINDEX", "TROPONINI" in the last 168 hours.  BNP: Invalid input(s): "POCBNP"  CBG: Recent Labs  Lab 07/02/23 0800 07/02/23 1214 07/02/23 1602 07/02/23 2132 07/03/23 0802  GLUCAP 79 170* 150* 155* 82    Microbiology: Results for orders placed or performed during the hospital encounter of 06/21/23  Culture, blood (Routine x 2)     Status: None   Collection  Time: 06/21/23  3:02 PM   Specimen: BLOOD  Result Value Ref Range Status   Specimen Description BLOOD LEFT ANTECUBITAL  Final   Special Requests   Final    BOTTLES DRAWN AEROBIC AND ANAEROBIC Blood Culture adequate volume   Culture   Final    NO GROWTH 5 DAYS Performed at [facility], [address] [place], [place] [address]    Report Status 06/26/2023 FINAL  Final  Culture, blood (Routine x 2)     Status: None   Collection Time: 06/21/23  3:13 PM   Specimen: BLOOD  Result Value Ref Range Status   Specimen Description BLOOD RIGHT ANTECUBITAL  Final   Special Requests   Final    BOTTLES  DRAWN AEROBIC AND ANAEROBIC Blood Culture adequate volume   Culture   Final    NO GROWTH 5 DAYS Performed at [facility], [address] [place], [place] [address]    Report Status 06/26/2023 FINAL  Final  Resp panel by RT-PCR (RSV, Flu A&B, Covid) Anterior Nasal Swab     Status: None   Collection Time: 06/21/23  4:35 PM   Specimen: Anterior Nasal Swab  Result Value Ref Range Status   SARS Coronavirus 2 by RT PCR NEGATIVE NEGATIVE Final    Comment: (NOTE) SARS-CoV-2 target nucleic acids are NOT DETECTED.  The SARS-CoV-2 RNA is generally detectable in upper respiratory specimens during the acute phase of infection. The lowest concentration of SARS-CoV-2 viral copies this assay can detect is 138 copies/mL. A negative result does not preclude SARS-Cov-2 infection and should not be used as the sole basis for treatment or other patient management decisions. A negative result may occur with  improper specimen collection/handling, submission of specimen other than nasopharyngeal swab, presence of viral mutation(s) within the areas targeted by this assay, and inadequate number of viral copies(<138 copies/mL). A negative result must be combined with clinical observations, patient history, and epidemiological information. The expected result is Negative.  Fact Sheet for Patients:  BloggerCourse.com  Fact Sheet for Healthcare Providers:  SeriousBroker.it  This test is no t yet approved or cleared by the [place] FDA and  has been authorized for detection and/or diagnosis of SARS-CoV-2 by FDA under an Emergency Use Authorization (EUA). This EUA will remain  in effect (meaning this test can be used) for the duration of the COVID-19 declaration under Section 564(b)(1) of the Act, 21 U.S.C.section 360bbb-3(b)(1), unless the authorization is terminated  or revoked sooner.       Influenza A by PCR NEGATIVE NEGATIVE Final    Influenza B by PCR NEGATIVE NEGATIVE Final    Comment: (NOTE) The Xpert Xpress SARS-CoV-2/FLU/RSV plus assay is intended as an aid in the diagnosis of influenza from Nasopharyngeal swab specimens and should not be used as a sole basis for treatment. Nasal washings and aspirates are unacceptable for Xpert Xpress SARS-CoV-2/FLU/RSV testing.  Fact Sheet for Patients: BloggerCourse.com  Fact Sheet for Healthcare Providers: SeriousBroker.it  This test is not yet approved or cleared by the [place] FDA and has been authorized for detection and/or diagnosis of SARS-CoV-2 by FDA under an Emergency Use Authorization (EUA). This EUA will remain in effect (meaning this test can be used) for the duration of the COVID-19 declaration under Section 564(b)(1) of the Act, 21 U.S.C. section 360bbb-3(b)(1), unless the authorization is terminated or revoked.     Resp Syncytial Virus by PCR NEGATIVE NEGATIVE Final    Comment: (NOTE) Fact Sheet for Patients: BloggerCourse.com  Fact Sheet for Healthcare Providers: SeriousBroker.it  This test is not yet approved or cleared by the [place] FDA and has been authorized for detection and/or diagnosis of SARS-CoV-2 by FDA under an Emergency Use Authorization (EUA). This EUA will remain in effect (meaning this test can be used) for the duration of the COVID-19 declaration under Section 564(b)(1) of the Act, 21 U.S.C. section 360bbb-3(b)(1), unless the authorization is terminated or revoked.  Performed at [facility], [address] [place], [place] [address]   MRSA Next Gen by PCR, Nasal     Status: Abnormal   Collection Time: 06/21/23  8:14 PM   Specimen: Nasal Mucosa; Nasal Swab  Result Value Ref Range Status   MRSA by PCR Next Gen DETECTED (A) NOT DETECTED Final    Comment: RESULT CALLED TO, READ BACK BY AND VERIFIED  WITH: KRISTY SNEEAD AT 2206 ON 06/21/23 BY SS (NOTE) The GeneXpert MRSA Assay (FDA approved for NASAL specimens only), is one component of a comprehensive MRSA colonization surveillance program. It is not intended to diagnose MRSA infection nor to guide or monitor treatment for MRSA infections. Test performance is not FDA approved in patients less than 14 years old. Performed at [facility], [address] [place], [place] [address]   Respiratory (~20 pathogens) panel by PCR     Status: None   Collection Time: 06/22/23  3:22 PM   Specimen: Nasopharyngeal Swab; Respiratory  Result Value Ref Range Status   Adenovirus NOT DETECTED NOT DETECTED Final   Coronavirus 229E NOT DETECTED NOT DETECTED Final    Comment: (NOTE) The Coronavirus on the Respiratory Panel, DOES NOT test for the novel  Coronavirus (2019 nCoV)    Coronavirus HKU1 NOT DETECTED NOT DETECTED Final   Coronavirus NL63 NOT DETECTED NOT DETECTED Final   Coronavirus OC43 NOT DETECTED NOT DETECTED Final   Metapneumovirus NOT DETECTED NOT DETECTED Final   Rhinovirus / Enterovirus NOT DETECTED NOT DETECTED Final   Influenza A NOT DETECTED NOT DETECTED Final   Influenza B NOT DETECTED NOT DETECTED Final   Parainfluenza Virus 1 NOT DETECTED NOT DETECTED Final   Parainfluenza Virus 2 NOT DETECTED NOT DETECTED Final   Parainfluenza Virus 3 NOT DETECTED NOT DETECTED Final   Parainfluenza Virus 4 NOT DETECTED NOT DETECTED Final   Respiratory Syncytial Virus NOT DETECTED NOT DETECTED Final   Bordetella pertussis NOT DETECTED NOT DETECTED Final   Bordetella Parapertussis NOT DETECTED NOT DETECTED Final   Chlamydophila pneumoniae NOT DETECTED NOT DETECTED Final   Mycoplasma pneumoniae NOT DETECTED NOT DETECTED Final    Comment: Performed at [facility], [address], [place], [place] [address]  Body fluid culture w Gram Stain     Status: None   Collection Time: 06/24/23 12:12 PM   Specimen: PATH Cytology  Peritoneal fluid  Result Value Ref Range Status   Specimen Description   Final    PERITONEAL Performed at [facility], [address], [place], [place] [address]    Special Requests   Final    NONE Performed at [facility], [address] [place], [place] [address]    Gram Stain NO WBC SEEN NO ORGANISMS SEEN   Final   Culture   Final    NO GROWTH 3 DAYS Performed at [facility], [address] [place], [place] [address]    Report Status 06/27/2023 FINAL  Final  MRSA Next Gen by PCR, Nasal     Status:  Abnormal   Collection Time: 06/26/23  5:43 PM   Specimen: Nasal Mucosa; Nasal Swab  Result Value Ref Range Status   MRSA by PCR Next Gen DETECTED (A) NOT DETECTED Final    Comment: RESULT CALLED TO, READ BACK BY AND VERIFIED WITH: [facility] WHITE 06/26/2023 2055 CP (NOTE) The GeneXpert MRSA Assay (FDA approved for NASAL specimens only), is one component of a comprehensive MRSA colonization surveillance program. It is not intended to diagnose MRSA infection nor to guide or monitor treatment for MRSA infections. Test performance is not FDA approved in patients less than 27 years old. Performed at [facility], [address] [place], [place] [address]   Body fluid culture w Gram Stain     Status: None   Collection Time: 06/28/23 12:25 PM   Specimen: PATH Cytology Pleural fluid  Result Value Ref Range Status   Specimen Description   Final    PLEURAL Performed at [facility], [address], [place], [place] [address]    Special Requests   Final    NONE Performed at [facility], [address] [place], [place] [address]    Gram Stain NO WBC SEEN NO ORGANISMS SEEN   Final   Culture   Final    NO GROWTH 3 DAYS Performed at [facility], [address], [place], [place] [address]    Report Status 07/02/2023 FINAL  Final    Coagulation Studies: No results for input(s): "LABPROT", "INR" in the last 72  hours.   Urinalysis: No results for input(s): "COLORURINE", "LABSPEC", "PHURINE", "GLUCOSEU", "HGBUR", "BILIRUBINUR", "KETONESUR", "PROTEINUR", "UROBILINOGEN", "NITRITE", "LEUKOCYTESUR" in the last 72 hours.  Invalid input(s): "APPERANCEUR"    Imaging: No results found.   Medications:    sodium chloride Stopped (06/27/23 2352)    acidophilus  1 capsule Oral Daily   vitamin C  500 mg Oral Daily   fluticasone  1 spray Each Nare Daily   folic acid  1 mg Oral Daily   furosemide  20 mg Oral Daily   gemfibrozil  600 mg Oral BID AC   guaiFENesin  600 mg Oral BID   insulin aspart  0-5 Units Subcutaneous QHS   insulin aspart  0-9 Units Subcutaneous TID WC   insulin glargine-yfgn  7 Units Subcutaneous QHS   Interferon Beta-1b  0.25 mg Subcutaneous QODAY   iron polysaccharides  150 mg Oral Daily   loratadine  10 mg Oral Daily   nystatin   Topical TID   mouth rinse  15 mL Mouth Rinse 4 times per day   pantoprazole  40 mg Oral BID   pregabalin  200 mg Oral BID   sertraline  25 mg Oral Daily   simvastatin  40 mg Oral q1800   tamsulosin  0.4 mg Oral QPC supper   Vitamin D (Ergocalciferol)  50,000 Units Oral Q7 days   sodium chloride, acetaminophen, albuterol, guaiFENesin-dextromethorphan, hydrALAZINE, loperamide, ondansetron (ZOFRAN) IV, mouth rinse, traMADol  Assessment/ Plan:  Mr. Keinan Myren is a 72 y.o.  male with past medical conditions including hyperlipidemia, diabetes, hypertension, respiratory failure, COPD with 4 L nasal cannula at baseline, depression, diastolic heart failure, multiple sclerosis, liver cirrhosis, anemia, pulmonary hypertension, and chronic kidney disease stage IIIa.  Patient presents to the emergency department complaining of shortness of breath and has been admitted for Acute on chronic respiratory failure with hypoxia (HCC) [J96.21]   Chronic kidney disease stage 2,. Chronic kidney disease is secondary to diabetes, hypertension, advanced age -  Continue  oral furosemide 20 mg daily at discharge. - Creatinine stable -  Patient has follow up appt in our office on 07/18/23 with Dr Thedore Mins.   Lab Results  Component Value Date   CREATININE 1.26 (H) 07/02/2023   CREATININE 1.41 (H) 07/01/2023   CREATININE 1.23 06/30/2023    Intake/Output Summary (Last 24 hours) at 07/03/2023 0955 Last data filed at 07/03/2023 1478 Gross per 24 hour  Intake 1141.7 ml  Output 1200 ml  Net -58.3 ml   2.  Volume overload, appears multifactorial with contributing factors of COPD   CT angiogram from 06/26/2023-large right pleural effusion. -Continue current diuresis therapies as patient responding appropriately . Right thoracentesis done on 06/28/2023 with 400 cc fluid removed.  3. Anemia of chronic kidney disease Lab Results  Component Value Date   HGB 8.8 (L) 07/02/2023    Patient has received blood transfusion during this admission on 06/23/2023.  Hemoglobin stable  4.  Diabetes mellitus type II with chronic kidney disease/renal manifestations: insulin dependent. Home regimen includes Lantus. Most recent hemoglobin A1c is 4.4 on 11/24/22.   Primary team to continue management of sliding scale insulin.   LOS: 12 Meko Masterson 9/8/20249:55 AM

## 2023-07-03 NOTE — Plan of Care (Signed)
  Problem: Education: Goal: Ability to describe self-care measures that may prevent or decrease complications (Diabetes Survival Skills Education) will improve Outcome: Progressing   Problem: Coping: Goal: Ability to adjust to condition or change in health will improve Outcome: Progressing   Problem: Fluid Volume: Goal: Ability to maintain a balanced intake and output will improve Outcome: Progressing   Problem: Health Behavior/Discharge Planning: Goal: Ability to identify and utilize available resources and services will improve Outcome: Progressing Goal: Ability to manage health-related needs will improve Outcome: Progressing   Problem: Nutritional: Goal: Maintenance of adequate nutrition will improve Outcome: Progressing   Problem: Skin Integrity: Goal: Risk for impaired skin integrity will decrease Outcome: Progressing   Problem: Tissue Perfusion: Goal: Adequacy of tissue perfusion will improve Outcome: Progressing   Problem: Activity: Goal: Capacity to carry out activities will improve Outcome: Progressing   Problem: Activity: Goal: Ability to tolerate increased activity will improve Outcome: Progressing   Problem: Respiratory: Goal: Levels of oxygenation will improve Outcome: Progressing   Problem: Activity: Goal: Capacity to carry out activities will improve Outcome: Progressing

## 2023-07-03 NOTE — Progress Notes (Signed)
Progress Note   Patient: Jeremy [name] EAV:[id number] DOB: 04-04-1951 DOA: 06/21/2023     12 DOS: the patient was seen and examined on 07/03/2023   Brief hospital course: Jeremy [name] is a 72 y.o. male with medical history significant of HTN, HLD, DM, chronic respiratory failure, COPD on 4L O2, dCHF, GIB, depression, MS (wheelchair-bound), pulmonary hypertension, chronic pain, liver cirrhosis, obesity, anemia, who presents with SOB. Patient was recently hospitalized from 7/12 - 7/26 due to CHF exacerbation and right lobar pneumonia. Patient usually on 4 L supplemental oxygen is currently requiring 8 L.  Patient has been having worsening shortness of breath and leg swelling with 13lb  weight gain.  Patient was started on IV Lasix, IV Solu-Medrol and nebulizer therapy along with IV antibiotics. Oxygen gradually weaned off. He will need PT evaluation, palliative care follow up and disposition plan.   Assessment and Plan: Acute on chronic hypoxic respiratory failure most likely due to CHF exacerbation, Multifocal pneumonia  Patient uses 4 L oxygen at baseline currently on 8-9 L.  Continue to wean oxygen as tolerated to maintain o2 saturation around 92%. CT chest: Bilateral lung collapse, multifocal pneumonia, bilateral effusion R >L. 9/1 started Unasyn which I stopped today. He has been having diarrhea. Finished doxycycline 100 mg IV twice daily for aspiration Total duration of antibiotics 7 days. 9/2 patient's respiratory status got worse on 9/1, moved to stepdown unit, PCCM on board. Encourage incentive spirometry. 9/5 moved out of step down,  Weaned O2 to his baseline 4L, maintains 90-92% saturation. With mild exertion sats drop but quickly recovers to 91. Continue oral Lasix for diuresis.   Diastolic CHF exacerbation and anasarca with significant lower extremity edema Patient presented with shortness of breath and LE edema S/p Lasix 40 mg IV twice daily transitioned to IV infusion changed to oral  20mg  daily per nephrology. S/p albumin infusions Continue to aim for net negative output. 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  As per patient's wife patient cannot have more procedures done due to respiratory failure and he may not wake up.  Patient and his wife would prefer conservative management.  8/30 Hb 6.9, 1 unit PRBC transfused. Hb around 8 and stable.  Stage 3a chronic kidney disease  Recent baseline 0.90 1.2-1.4.   Creatinine stable on current diuresis Monitor renal function and urine output S/p IV albumin as above Nephrology on board, follow further recommendations.   COPD exacerbation, most likely due to volume overload s/p Solu-Medrol 40 mg IV BID, DC'd on 8/29  S/p  doxycycline 100 mg p.o. twice daily d/c'd on 8/30 Started Mucinex 600 mg p.o. twice daily Continue Robitussin DM as needed. Will prescribe nebulizer machine for duonebs PRN.   Pleural effusion Moderate right pleural effusion most likely due to CHF, liver cirrhosis with hypoalbuminemia. Continue diuresis as above 8/30 s/p thoracentesis 1.2 L fluid was tapped by IR, postprocedure x-ray did not show any complications. Fluid studies, as per lights criteria, LDH elevated, consistent with exudative but clinically no signs of infection. Started Abx due to CT scan findings of multifocal pneumonia, bilateral effusion, right is moderate. 9/3 s/p repeat  right thoracentesis, 400 mL fluid was removed.   Type 2 diabetes mellitus- Continue Semglee 7 units nightly Accucheks with sliding scale insulin. Continue diabetic  diet Hypoglycemia protocol.   Essential hypertension IV hydralazine as needed Continue IV Lasix   Hyperlipidemia Zocor and lopid   Multiple sclerosis (HCC) pt is on Betaseron injection Fall precautions   Myocardial injury:  troponin level 21 --> -20.   No chest pain Continue Zocor   Folic acid deficiency, folate level 5.2 Daily folate supplement.   Hypokalemia-  secondary to diuresis, potassium repleted. Monitor electrolytes daily  Hypomagnesemia- Replete as needed and recheck.  Hypotonic hyponatremia: Resolved He got Sodium chloride 2 g p.o. 3 times daily for 5 days Continue fluid restriction 1200 ml/d Na stable at 144   Cirrhosis of liver without ascites (HCC): Mental status normal.  INR 1.3 Ammonia level 26 wnl   Chronic urinary retention: Foley removed , he is able to void. He is at high risk of retention. Bladder scan per nursing protocol. Continue Flomax. If persistent retention, replace foley with outpatient urology follow up.                                                                                                                                                                                                                                                                                                               Depression Continue home medication, Zoloft   Vitamin D deficiency: started vitamin D 50,000 units p.o. weekly, follow with PCP to repeat vitamin D level after 3 to 6 months.   Obesity Body mass index is 30.68 kg/m.  Diet, exercise and weight reduction advised.   Diet: Heart healthy/carb modified, fluid striction 1200 mL/day DVT Prophylaxis: Subcutaneous Lovenox  Palliative team on board. He wished for DNR- limited intervention. He is leaning towards comfort measure only, palliative team to see him as outpatient. He wishes to go home with [facility] and refuses SNF placement. Wife plan to make arrangement at home for discharge but want him to  get up  with sit to stand lift with PT. Also asks about nebulizer machine.       Subjective: Patient is seen and examined today morning.  Currently on 4L supplemental oxygen. Wishes to go home. Wife would like to make arrangements for discharge if he is able to use sit to stand lift.   Physical Exam: Vitals:   07/03/23 0605 07/03/23 0801 07/03/23 0801 07/03/23 1157  BP: (!) 144/73  (!) 155/65 (!) 148/60  Pulse: 74  71 77  Resp: 20  18 16   Temp: 98.2 F (36.8 C)  98.1 F (36.7 C) 98.1 F (36.7 C)  TempSrc: Oral     SpO2: 92% 92% 92% 91%  Weight: 95 kg     Height:       General - Elderly obese Caucasian ill looking male, no respiratory distress HEENT - PERRLA, EOMI, atraumatic head, non tender sinuses. Lung -diffuse rales, rhonchi. Heart - S1, S2 heard, no murmurs, rubs, 1+ pedal edema Neuro - Alert, awake and oriented x 3, non focal exam. Skin - Warm and dry. Data Reviewed:     Latest Ref Rng & Units 07/02/2023    7:09 AM 07/01/2023    3:06 AM 06/30/2023    6:00 AM  CBC  WBC 4.0 - 10.5 K/uL 5.8  5.5  5.1   Hemoglobin 13.0 - 17.0 g/dL 8.8  8.8  8.4   Hematocrit 39.0 - 52.0 % 28.1  28.5  26.6   Platelets 150 - 400 K/uL 198  221  204        Latest Ref Rng & Units 07/02/2023    7:09 AM 07/01/2023    3:06 AM 06/30/2023    6:00 AM  BMP  Glucose 70 - 99 mg/dL 78  244  91   BUN 8 - 23 mg/dL 35  45  40   Creatinine 0.61 - 1.24 mg/dL 0.10  2.72  5.36   Sodium 135 - 145 mmol/L 143  144  142   Potassium 3.5 - 5.1 mmol/L 3.2  4.0  3.6   Chloride 98 - 111 mmol/L 105  104  102   CO2 22 - 32 mmol/L 28  30  29    Calcium 8.9 - 10.3 mg/dL 7.9  8.1  7.9    No results found.  Family Communication: Wife at bedside, updated regarding current care plan  Disposition: Status is: Inpatient Remains inpatient appropriate because: close monitoring of respiratory status, input and output  Planned Discharge Destination: Home with Home Health,     Time spent .  Author: Marcelino Duster, MD 07/03/2023 1:49 PM  For on call review [email or web address].

## 2023-07-03 NOTE — Consult Note (Signed)
PHARMACY CONSULT NOTE - ELECTROLYTES  Pharmacy Consult for Electrolyte Monitoring and Replacement   Recent Labs: Height: 5\' 10"  (177.8 cm) Weight: 95 kg (209 lb 7 oz) IBW/kg (Calculated) : 73 Estimated Creatinine Clearance: 62.2 mL/min (A) (by C-G formula based on SCr of 1.26 mg/dL (H)). Potassium (mmol/L)  Date Value  07/02/2023 3.2 (L)  08/31/2013 3.5   Magnesium (mg/dL)  Date Value  41/32/4401 2.1   Calcium (mg/dL)  Date Value  02/72/5366 7.9 (L)   Calcium, Total (mg/dL)  Date Value  44/12/4740 8.9   Albumin (g/dL)  Date Value  59/56/3875 2.7 (L)  02/15/2022 3.6 (L)  08/31/2013 2.9 (L)   Phosphorus (mg/dL)  Date Value  64/33/2951 3.3  08/28/2013 4.9   Sodium (mmol/L)  Date Value  07/02/2023 143  02/15/2022 138  08/31/2013 143   Assessment  Jeremy [name] is a 72 y.o. male presenting with pneumonia. PMH significant for HTN, HLD, DM, chronic respiratory failure / COPD on 4L O2, diastolic CHF, GIB, depression, MS (wheel-chair bound), pulmonary HTN, chronic pain, liver cirrhosis, obesity, anemia. CT chest revealed features compatible with multifocal pneumonia. Pharmacy has been consulted to monitor and replace electrolytes.  Diet: heart healthy/carb modified - aspiration precautions MIVF: n/a Pertinent medications: lasix 20 mg PO daily.   Goal of Therapy: Electrolytes WNL  Plan:  No new labs.  F/u with AM labs.   Thank you for allowing pharmacy to be a part of this patient's care.  Paschal Dopp, PharmD, BCPS 07/03/2023 8:59 AM

## 2023-07-03 NOTE — Plan of Care (Signed)
  Problem: Pain Managment: Goal: General experience of comfort will improve Outcome: Progressing   Problem: Safety: Goal: Ability to remain free from injury will improve Outcome: Progressing   Problem: Nutrition: Goal: Adequate nutrition will be maintained Outcome: Progressing   Problem: Activity: Goal: Risk for activity intolerance will decrease Outcome: Progressing   Problem: Clinical Measurements: Goal: Ability to maintain clinical measurements within normal limits will improve Outcome: Progressing Goal: Will remain free from infection Outcome: Progressing Goal: Diagnostic test results will improve Outcome: Progressing Goal: Respiratory complications will improve Outcome: Progressing Goal: Cardiovascular complication will be avoided Outcome: Progressing   Problem: Cardiac: Goal: Ability to achieve and maintain adequate cardiopulmonary perfusion will improve Outcome: Progressing

## 2023-07-04 ENCOUNTER — Other Ambulatory Visit: Payer: Self-pay | Admitting: Family Medicine

## 2023-07-04 ENCOUNTER — Inpatient Hospital Stay: Payer: Medicare Other

## 2023-07-04 ENCOUNTER — Inpatient Hospital Stay: Payer: Medicare Other | Admitting: Oncology

## 2023-07-04 DIAGNOSIS — I272 Pulmonary hypertension, unspecified: Secondary | ICD-10-CM | POA: Diagnosis not present

## 2023-07-04 DIAGNOSIS — K219 Gastro-esophageal reflux disease without esophagitis: Secondary | ICD-10-CM

## 2023-07-04 DIAGNOSIS — J9621 Acute and chronic respiratory failure with hypoxia: Secondary | ICD-10-CM | POA: Diagnosis not present

## 2023-07-04 DIAGNOSIS — I5033 Acute on chronic diastolic (congestive) heart failure: Secondary | ICD-10-CM | POA: Diagnosis not present

## 2023-07-04 DIAGNOSIS — F331 Major depressive disorder, recurrent, moderate: Secondary | ICD-10-CM

## 2023-07-04 DIAGNOSIS — J441 Chronic obstructive pulmonary disease with (acute) exacerbation: Secondary | ICD-10-CM | POA: Diagnosis not present

## 2023-07-04 DIAGNOSIS — Z7189 Other specified counseling: Secondary | ICD-10-CM | POA: Diagnosis not present

## 2023-07-04 DIAGNOSIS — E782 Mixed hyperlipidemia: Secondary | ICD-10-CM

## 2023-07-04 LAB — BASIC METABOLIC PANEL
Anion gap: 6 (ref 5–15)
BUN: 41 mg/dL — ABNORMAL HIGH (ref 8–23)
CO2: 29 mmol/L (ref 22–32)
Calcium: 8.1 mg/dL — ABNORMAL LOW (ref 8.9–10.3)
Chloride: 107 mmol/L (ref 98–111)
Creatinine, Ser: 1.28 mg/dL — ABNORMAL HIGH (ref 0.61–1.24)
GFR, Estimated: 60 mL/min — ABNORMAL LOW (ref >60)
Glucose, Bld: 98 mg/dL (ref 70–99)
Potassium: 4.1 mmol/L (ref 3.5–5.1)
Sodium: 142 mmol/L (ref 135–145)

## 2023-07-04 LAB — GLUCOSE, CAPILLARY
Glucose-Capillary: 98 mg/dL (ref 70–99)
Glucose-Capillary: 77 mg/dL (ref 70–99)
Glucose-Capillary: 119 mg/dL — ABNORMAL HIGH (ref 70–99)
Glucose-Capillary: 132 mg/dL — ABNORMAL HIGH (ref 70–99)
Glucose-Capillary: 204 mg/dL — ABNORMAL HIGH (ref 70–99)

## 2023-07-04 NOTE — Progress Notes (Signed)
[facility]  ROUNDING NOTE   Subjective:   Jeremy [name] is a 72 year old male with past medical conditions including hyperlipidemia, diabetes, hypertension, respiratory failure, COPD with 4 L nasal cannula at baseline, depression, diastolic heart failure, multiple sclerosis, liver cirrhosis, anemia, pulmonary hypertension, and chronic kidney disease stage IIIa.  Patient presents to the emergency department complaining of shortness of breath and has been admitted for Acute on chronic respiratory failure with hypoxia [facility]) [J96.21]  Patient is known to our practice from outpatient follow-up.  Patient was last seen in office on 07/01/2022 for routine follow-up.    Patient seen laying in bed Alert and oriented Wife at bedside Remains on 4L [place]   Objective:  Vital signs in last 24 hours:  Temp:  [98.1 F (36.7 C)-99 F (37.2 C)] 98.1 F (36.7 C) (09/09 1246) Pulse Rate:  [62-89] 73 (09/09 1246) Resp:  [14-20] 14 (09/09 1246) BP: (118-150)/(53-89) 146/76 (09/09 1246) SpO2:  [80 %-96 %] 86 % (09/09 1246) Weight:  [97.2 kg] 97.2 kg (09/09 0420)  Weight change: 2.2 kg Filed Weights   07/02/23 0356 07/03/23 0605 07/04/23 0420  Weight: 94.7 kg 95 kg 97.2 kg    Intake/Output: I/O last 3 completed shifts: In: 988.7 [P.O.:750; I.V.:38.7; IV Piggyback:200] Out: 1975 [Urine:1975]   Intake/Output this shift:  Total I/O In: 240 [P.O.:240] Out: 225 [Urine:225]  Physical Exam: General: NAD  Head: Normocephalic, atraumatic. Moist oral mucosal membranes  Eyes: Anicteric  Lungs:  Diminished, [place]  Heart: Regular rate and rhythm  Abdomen:  Soft, nontender, nondistended  Extremities: + dependent peripheral edema.  Neurologic: Alert and oriented, able to follow simple commands  Skin: No lesions       Basic Metabolic Panel: Recent Labs  Lab 06/28/23 0441 06/29/23 0335 06/30/23 0600 07/01/23 0306 07/02/23 0709 07/04/23 0312  NA 144 144 142 144 143 142  K 3.2* 3.4* 3.6  4.0 3.2* 4.1  CL 106 103 102 104 105 107  CO2 30 29 29 30 28 29   GLUCOSE 94 85 91 104* 78 98  BUN 41* 39* 40* 45* 35* 41*  CREATININE 1.16 1.33* 1.23 1.41* 1.26* 1.28*  CALCIUM 8.1* 7.9* 7.9* 8.1* 7.9* 8.1*  MG 1.9 1.6* 2.1  --   --   --   PHOS 3.2 3.2 3.3  --   --   --     Liver Function Tests: No results for input(s): "AST", "ALT", "ALKPHOS", "BILITOT", "PROT", "ALBUMIN" in the last 168 hours.  No results for input(s): "LIPASE", "AMYLASE" in the last 168 hours. No results for input(s): "AMMONIA" in the last 168 hours.   CBC: Recent Labs  Lab 06/28/23 0441 06/28/23 1603 06/29/23 0335 06/30/23 0600 07/01/23 0306 07/02/23 0709  WBC 6.2  --  5.7 5.1 5.5 5.8  HGB 8.5* 9.6* 8.4* 8.4* 8.8* 8.8*  HCT 26.6* 30.5* 27.1* 26.6* 28.5* 28.1*  MCV 88.1  --  88.3 86.4 88.8 87.8  PLT 238  --  232 204 221 198    Cardiac Enzymes: No results for input(s): "CKTOTAL", "CKMB", "CKMBINDEX", "TROPONINI" in the last 168 hours.  BNP: Invalid input(s): "POCBNP"  CBG: Recent Labs  Lab 07/03/23 1555 07/03/23 2058 07/04/23 0152 07/04/23 0736 07/04/23 1249  GLUCAP 128* 112* 98 77 119*    Microbiology: Results for orders placed or performed during the hospital encounter of 06/21/23  Culture, blood (Routine x 2)     Status: None   Collection Time: 06/21/23  3:02 PM   Specimen: BLOOD  Result Value Ref Range Status   Specimen Description BLOOD LEFT ANTECUBITAL  Final   Special Requests   Final    BOTTLES DRAWN AEROBIC AND ANAEROBIC Blood Culture adequate volume   Culture   Final    NO GROWTH 5 DAYS Performed at [facility], [address], [place], [place] [address]    Report Status 06/26/2023 FINAL  Final  Culture, blood (Routine x 2)     Status: None   Collection Time: 06/21/23  3:13 PM   Specimen: BLOOD  Result Value Ref Range Status   Specimen Description BLOOD RIGHT ANTECUBITAL  Final   Special Requests   Final    BOTTLES DRAWN AEROBIC AND ANAEROBIC Blood Culture  adequate volume   Culture   Final    NO GROWTH 5 DAYS Performed at [facility], [address] [place], [place] [address]    Report Status 06/26/2023 FINAL  Final  Resp panel by RT-PCR (RSV, Flu A&B, Covid) Anterior Nasal Swab     Status: None   Collection Time: 06/21/23  4:35 PM   Specimen: Anterior Nasal Swab  Result Value Ref Range Status   SARS Coronavirus 2 by RT PCR NEGATIVE NEGATIVE Final    Comment: (NOTE) SARS-CoV-2 target nucleic acids are NOT DETECTED.  The SARS-CoV-2 RNA is generally detectable in upper respiratory specimens during the acute phase of infection. The lowest concentration of SARS-CoV-2 viral copies this assay can detect is 138 copies/mL. A negative result does not preclude SARS-Cov-2 infection and should not be used as the sole basis for treatment or other patient management decisions. A negative result may occur with  improper specimen collection/handling, submission of specimen other than nasopharyngeal swab, presence of viral mutation(s) within the areas targeted by this assay, and inadequate number of viral copies(<138 copies/mL). A negative result must be combined with clinical observations, patient history, and epidemiological information. The expected result is Negative.  Fact Sheet for Patients:  BloggerCourse.com  Fact Sheet for Healthcare Providers:  SeriousBroker.it  This test is no t yet approved or cleared by the [place] FDA and  has been authorized for detection and/or diagnosis of SARS-CoV-2 by FDA under an Emergency Use Authorization (EUA). This EUA will remain  in effect (meaning this test can be used) for the duration of the COVID-19 declaration under Section 564(b)(1) of the Act, 21 U.S.C.section 360bbb-3(b)(1), unless the authorization is terminated  or revoked sooner.       Influenza A by PCR NEGATIVE NEGATIVE Final   Influenza B by PCR NEGATIVE NEGATIVE Final     Comment: (NOTE) The Xpert Xpress SARS-CoV-2/FLU/RSV plus assay is intended as an aid in the diagnosis of influenza from Nasopharyngeal swab specimens and should not be used as a sole basis for treatment. Nasal washings and aspirates are unacceptable for Xpert Xpress SARS-CoV-2/FLU/RSV testing.  Fact Sheet for Patients: BloggerCourse.com  Fact Sheet for Healthcare Providers: SeriousBroker.it  This test is not yet approved or cleared by the [place] FDA and has been authorized for detection and/or diagnosis of SARS-CoV-2 by FDA under an Emergency Use Authorization (EUA). This EUA will remain in effect (meaning this test can be used) for the duration of the COVID-19 declaration under Section 564(b)(1) of the Act, 21 U.S.C. section 360bbb-3(b)(1), unless the authorization is terminated or revoked.     Resp Syncytial Virus by PCR NEGATIVE NEGATIVE Final    Comment: (NOTE) Fact Sheet for Patients: BloggerCourse.com  Fact Sheet for Healthcare Providers: SeriousBroker.it  This test  is not yet approved or cleared by the [place] and has been authorized for detection and/or diagnosis of SARS-CoV-2 by FDA under an Emergency Use Authorization (EUA). This EUA will remain in effect (meaning this test can be used) for the duration of the COVID-19 declaration under Section 564(b)(1) of the Act, 21 U.S.C. section 360bbb-3(b)(1), unless the authorization is terminated or revoked.  Performed at [facility], [address] [place], [place] [address]   MRSA Next Gen by PCR, Nasal     Status: Abnormal   Collection Time: 06/21/23  8:14 PM   Specimen: Nasal Mucosa; Nasal Swab  Result Value Ref Range Status   MRSA by PCR Next Gen DETECTED (A) NOT DETECTED Final    Comment: RESULT CALLED TO, READ BACK BY AND VERIFIED WITH: KRISTY SNEEAD AT 2206 ON 06/21/23 BY  SS (NOTE) The GeneXpert MRSA Assay (FDA approved for NASAL specimens only), is one component of a comprehensive MRSA colonization surveillance program. It is not intended to diagnose MRSA infection nor to guide or monitor treatment for MRSA infections. Test performance is not FDA approved in patients less than 52 years old. Performed at [facility], [address] [place], [place] [address]   Respiratory (~20 pathogens) panel by PCR     Status: None   Collection Time: 06/22/23  3:22 PM   Specimen: Nasopharyngeal Swab; Respiratory  Result Value Ref Range Status   Adenovirus NOT DETECTED NOT DETECTED Final   Coronavirus 229E NOT DETECTED NOT DETECTED Final    Comment: (NOTE) The Coronavirus on the Respiratory Panel, DOES NOT test for the novel  Coronavirus (2019 nCoV)    Coronavirus HKU1 NOT DETECTED NOT DETECTED Final   Coronavirus NL63 NOT DETECTED NOT DETECTED Final   Coronavirus OC43 NOT DETECTED NOT DETECTED Final   Metapneumovirus NOT DETECTED NOT DETECTED Final   Rhinovirus / Enterovirus NOT DETECTED NOT DETECTED Final   Influenza A NOT DETECTED NOT DETECTED Final   Influenza B NOT DETECTED NOT DETECTED Final   Parainfluenza Virus 1 NOT DETECTED NOT DETECTED Final   Parainfluenza Virus 2 NOT DETECTED NOT DETECTED Final   Parainfluenza Virus 3 NOT DETECTED NOT DETECTED Final   Parainfluenza Virus 4 NOT DETECTED NOT DETECTED Final   Respiratory Syncytial Virus NOT DETECTED NOT DETECTED Final   Bordetella pertussis NOT DETECTED NOT DETECTED Final   Bordetella Parapertussis NOT DETECTED NOT DETECTED Final   Chlamydophila pneumoniae NOT DETECTED NOT DETECTED Final   Mycoplasma pneumoniae NOT DETECTED NOT DETECTED Final    Comment: Performed at [facility], [address], [place], [place] [address]  Body fluid culture w Gram Stain     Status: None   Collection Time: 06/24/23 12:12 PM   Specimen: PATH Cytology Peritoneal fluid  Result Value Ref Range Status    Specimen Description   Final    PERITONEAL Performed at [facility], [address] [place], [place] [address]    Special Requests   Final    NONE Performed at [facility], [address] [place], [place] [address]    Gram Stain NO WBC SEEN NO ORGANISMS SEEN   Final   Culture   Final    NO GROWTH 3 DAYS Performed at [facility], [address] [place], [place] [address]    Report Status 06/27/2023 FINAL  Final  MRSA Next Gen by PCR, Nasal     Status: Abnormal   Collection Time: 06/26/23  5:43 PM  Specimen: Nasal Mucosa; Nasal Swab  Result Value Ref Range Status   MRSA by PCR Next Gen DETECTED (A) NOT DETECTED Final    Comment: RESULT CALLED TO, READ BACK BY AND VERIFIED WITH: [facility] WHITE 06/26/2023 2055 CP (NOTE) The GeneXpert MRSA Assay (FDA approved for NASAL specimens only), is one component of a comprehensive MRSA colonization surveillance program. It is not intended to diagnose MRSA infection nor to guide or monitor treatment for MRSA infections. Test performance is not FDA approved in patients less than 13 years old. Performed at [facility], [address] [place], [place] [address]   Body fluid culture w Gram Stain     Status: None   Collection Time: 06/28/23 12:25 PM   Specimen: PATH Cytology Pleural fluid  Result Value Ref Range Status   Specimen Description   Final    PLEURAL Performed at [facility], [address], [place], [place] [address]    Special Requests   Final    NONE Performed at [facility], [address] [place], [place] [address]    Gram Stain NO WBC SEEN NO ORGANISMS SEEN   Final   Culture   Final    NO GROWTH 3 DAYS Performed at [facility], [address] [place], [place] [address]    Report Status 07/02/2023 FINAL  Final    Coagulation Studies: No results for input(s): "LABPROT", "INR" in the last 72 hours.   Urinalysis: No results for input(s):  "COLORURINE", "LABSPEC", "PHURINE", "GLUCOSEU", "HGBUR", "BILIRUBINUR", "KETONESUR", "PROTEINUR", "UROBILINOGEN", "NITRITE", "LEUKOCYTESUR" in the last 72 hours.  Invalid input(s): "APPERANCEUR"    Imaging: No results found.   Medications:    sodium chloride Stopped (06/27/23 2352)    acidophilus  1 capsule Oral Daily   vitamin C  500 mg Oral Daily   fluticasone  1 spray Each Nare Daily   folic acid  1 mg Oral Daily   furosemide  20 mg Oral Daily   gemfibrozil  600 mg Oral BID AC   guaiFENesin  600 mg Oral BID   insulin aspart  0-5 Units Subcutaneous QHS   insulin aspart  0-9 Units Subcutaneous TID WC   insulin glargine-yfgn  7 Units Subcutaneous QHS   Interferon Beta-1b  0.25 mg Subcutaneous QODAY   iron polysaccharides  150 mg Oral Daily   loratadine  10 mg Oral Daily   nystatin   Topical TID   mouth rinse  15 mL Mouth Rinse 4 times per day   pantoprazole  40 mg Oral BID   pregabalin  200 mg Oral BID   sertraline  25 mg Oral Daily   simvastatin  40 mg Oral q1800   tamsulosin  0.4 mg Oral QPC supper   Vitamin D (Ergocalciferol)  50,000 Units Oral Q7 days   sodium chloride, acetaminophen, albuterol, guaiFENesin-dextromethorphan, hydrALAZINE, loperamide, ondansetron (ZOFRAN) IV, mouth rinse, traMADol  Assessment/ Plan:  [name] is a 72 y.o.  male with past medical conditions including hyperlipidemia, diabetes, hypertension, respiratory failure, COPD with 4 L nasal cannula at baseline, depression, diastolic heart failure, multiple sclerosis, liver cirrhosis, anemia, pulmonary hypertension, and chronic kidney disease stage IIIa.  Patient presents to the emergency department complaining of shortness of breath and has been admitted for Acute on chronic respiratory failure with hypoxia (HCC) [J96.21]   Chronic kidney disease stage 2,. Chronic kidney disease is secondary to diabetes, hypertension, advanced age -Continue oral furosemide 20 mg daily at discharge. -  Creatinine  remains stable -  Patient has follow up appt in our office on 07/18/23 with Dr Thedore Mins.   Lab Results  Component Value Date   CREATININE 1.28 (H) 07/04/2023   CREATININE 1.26 (H) 07/02/2023   CREATININE 1.41 (H) 07/01/2023    Intake/Output Summary (Last 24 hours) at 07/04/2023 1411 Last data filed at 07/04/2023 1300 Gross per 24 hour  Intake 638.73 ml  Output 1200 ml  Net -561.27 ml   2.  Volume overload, appears multifactorial with contributing factors of COPD   CT angiogram from 06/26/2023-large right pleural effusion. Right thoracentesis done on 06/28/2023 with 400 cc fluid removed. Continue regimen  3. Anemia of chronic kidney disease Lab Results  Component Value Date   HGB 8.8 (L) 07/02/2023    Patient has received blood transfusion during this admission on 06/23/2023.    4.  Diabetes mellitus type II with chronic kidney disease/renal manifestations: insulin dependent. Home regimen includes Lantus. Most recent hemoglobin A1c is 4.4 on 11/24/22.   Glucose well controlled   LOS: 13 Chaunice Obie 9/9/20242:11 PM

## 2023-07-04 NOTE — Progress Notes (Signed)
Patient has CHF which requires body repositioning. Patient  head must be elevated or shortness of breath and difficulty breathing may occur. CHF requires frequently,upon awakening and or immediate changes in body position which cannot be achieved with a normal bed.

## 2023-07-04 NOTE — Progress Notes (Signed)
Occupational Therapy Treatment Patient Details Name: Jeremy [name] MRN: [id number] DOB: 1951/04/02 Today's Date: 07/04/2023   History of present illness Pt is a 72 year old male presenting to ED with SOB, admitted with acute on chronic respiratory failure due to CHF and COPD exacerbation, pleural effusion     PMH significant for HTN, HLD, DM, chronic respiratory failure, COPD on 4L O2, dCHF, GIB, depression, MS (wheelchair-bound), pulmonary hypertension, chronic pain, liver cirrhosis, obesity, anemia.   OT comments  Pt seen for co-tx with PT to maximize outcomes and for pt + therapist safety. Session did not focus on ADL performance this date - instead co treat focuses on improving activity tolerance seated EOB in attempts to utilize various lifts to trial with relevant education. Vitals monitored - Pt on 4L O2 via [place] on arrival, 87-91% without activity, desaturating to 73% while transitioning to EOB requiring 4.5L to increase sats to low 90s. Pt unable to sustain seated position due to poor activity tolerance. Discussed recommendations for safe transport home via EMS vs personal car, use of hoyler lift vs lift chair and hospital bed. Extensive education on rationale behind higher level of support for transfers to protect pt and spouse's safety. Discussed rationale behind mobility including O2 saturation levels and decreased endurance prohibiting safe transfer with STS lift. Pt is not appropriate for sit to stand lift at this time as he lacks the UB strength and tolerance to activity with O2 levels desaturating. Bed mobility maxA-total A this date. Pt and spouse verbalizing understanding. Care team updated via secure chat. Pt left in bed with spouse present.        If plan is discharge home, recommend the following:  A lot of help with bathing/dressing/bathroom;Assistance with cooking/housework;Assist for transportation;Help with stairs or ramp for entrance;Direct supervision/assist for financial  management (hoyer lift for transfers)   Equipment Recommendations  Hospital bed;Hoyer lift       Precautions / Restrictions Precautions Precautions: Fall Precaution Comments: watch O2 for desat Restrictions Weight Bearing Restrictions: No       Mobility Bed Mobility Overal bed mobility: Needs Assistance Bed Mobility: Supine to Sit, Sit to Supine     Supine to sit: Max assist, +2 for physical assistance, +2 for safety/equipment, HOB elevated, Used rails Sit to supine: Max assist, +2 for physical assistance, +2 for safety/equipment, Used rails, Total assist   General bed mobility comments: Posterior push during transition towards EOB, MaxA of 2, increasing to TOTAL A of 2 end of ession    Transfers Overall transfer level: Needs assistance Equipment used: None              Lateral/Scoot Transfers: Max assist, +2 physical assistance, +2 safety/equipment General transfer comment: Unable to formally assess due to poor activity tolerance and safety concerns     Balance Overall balance assessment: Needs assistance Sitting-balance support: Bilateral upper extremity supported, Feet supported Sitting balance-Leahy Scale: Poor Sitting balance - Comments: Seated EOB with BUE support throughout for trial of 2-3 mins, requiring MAX A for posterior push, and L lean with poor tolerance to activity Postural control: Posterior lean, Left lateral lean   Standing balance-Leahy Scale: Zero Standing balance comment: Not assessed due to safety concerns                           ADL either performed or assessed with clinical judgement   ADL  General ADL Comments: Session did not focus on ADL performance this date - instead cotx focuses on improving activity tolerance seated EOB in attempts to utilize various lifts to trial.    Extremity/Trunk Assessment Upper Extremity Assessment Upper Extremity Assessment:  Generalized weakness (very weak with min activity)             Cognition Arousal: Lethargic Behavior During Therapy: WFL for tasks assessed/performed Overall Cognitive Status: Impaired/Different from baseline Area of Impairment: Problem solving                             Problem Solving: Slow processing General Comments: Pleasant and cooperative for session but appears fatigue        Exercises Other Exercises Other Exercises: Discussed recommendations for safe transport home via EMS vs personal car, use of [place]ler lift vs lift chair and hospital bed. Extensive education on rationale behind higher level of support for transfers to protect pt and spouse's safety. Discussed rationale behind mobility including O2 saturation levels and decreased endurance       General Comments Vitals monitored. Pt on 4L O2 via [place] on arrival, 87-91% without activity, desaturating to 73% while transitioning to EOB requiring 4.5L to increase sats to low 90s. Pt unable to sustain seated position due to poor activity tolerance    Pertinent Vitals/ Pain       Pain Assessment Pain Assessment: No/denies pain   Frequency  Min 1X/week        Progress Toward Goals  OT Goals(current goals can now be found in the care plan section)  Progress towards OT goals: Progressing toward goals  Acute Rehab OT Goals OT Goal Formulation: With patient/family Time For Goal Achievement: 07/12/23 Potential to Achieve Goals: Good ADL Goals Pt Will Perform Grooming: with set-up;sitting Pt Will Perform Lower Body Dressing: with mod assist;sitting/lateral leans Pt Will Transfer to Toilet: with max assist;squat pivot transfer Pt Will Perform Toileting - Clothing Manipulation and hygiene: with max assist;sitting/lateral leans  Plan      Co-evaluation    PT/OT/SLP Co-Evaluation/Treatment: Yes Reason for Co-Treatment: Complexity of the patient's impairments (multi-system involvement);Necessary to address  cognition/behavior during functional activity;For patient/therapist safety PT goals addressed during session: Mobility/safety with mobility OT goals addressed during session: ADL's and self-care      AM-PAC OT "6 Clicks" Daily Activity     Outcome Measure   Help from another person eating meals?: A Little Help from another person taking care of personal grooming?: A Little Help from another person toileting, which includes using toliet, bedpan, or urinal?: Total Help from another person bathing (including washing, rinsing, drying)?: Total Help from another person to put on and taking off regular upper body clothing?: A Lot Help from another person to put on and taking off regular lower body clothing?: A Lot 6 Click Score: 2    End of Session Equipment Utilized During Treatment: Oxygen  OT Visit Diagnosis: Other abnormalities of gait and mobility (R26.89);Muscle weakness (generalized) (M62.81)   Activity Tolerance Patient limited by fatigue   Patient Left in bed;with call bell/phone within reach;with family/visitor present   Nurse Communication Other (comment)        Time: 6295-2841 OT Time Calculation (min): 29 min  Charges: OT General Charges $OT Visit: 1 Visit OT Treatments $Therapeutic Activity: 8-22 mins  Jaymason Ledesma L. Hattye Siegfried, OTR/L  07/04/23, 1:01 PM

## 2023-07-04 NOTE — Assessment & Plan Note (Deleted)
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 Labs reviewed and discussed with patient. Lab Results  Component Value Date   HGB 8.8 (L) 07/02/2023   TIBC 253 06/06/2023   IRONPCTSAT 12 (L) 06/06/2023   FERRITIN 263 06/06/2023   Recommend IV Venofer x 1

## 2023-07-04 NOTE — Consult Note (Signed)
PHARMACY CONSULT NOTE - ELECTROLYTES  Pharmacy Consult for Electrolyte Monitoring and Replacement   Recent Labs: Height: 5\' 10"  (177.8 cm) Weight: 97.2 kg (214 lb 4.6 oz) IBW/kg (Calculated) : 73 Estimated Creatinine Clearance: 61.9 mL/min (A) (by C-G formula based on SCr of 1.28 mg/dL (H)). Potassium (mmol/L)  Date Value  07/04/2023 4.1  08/31/2013 3.5   Magnesium (mg/dL)  Date Value  69/62/9528 2.1   Calcium (mg/dL)  Date Value  41/32/4401 8.1 (L)   Calcium, Total (mg/dL)  Date Value  02/72/5366 8.9   Albumin (g/dL)  Date Value  44/12/4740 2.7 (L)  02/15/2022 3.6 (L)  08/31/2013 2.9 (L)   Phosphorus (mg/dL)  Date Value  59/56/3875 3.3  08/28/2013 4.9   Sodium (mmol/L)  Date Value  07/04/2023 142  02/15/2022 138  08/31/2013 143   Assessment  Jeremy [name] is a 72 y.o. male presenting with pneumonia. PMH significant for HTN, HLD, DM, chronic respiratory failure / COPD on 4L O2, diastolic CHF, GIB, depression, MS (wheel-chair bound), pulmonary HTN, chronic pain, liver cirrhosis, obesity, anemia. CT chest revealed features compatible with multifocal pneumonia. Pharmacy has been consulted to monitor and replace electrolytes.  Diet: heart healthy/carb modified - aspiration precautions MIVF: n/a Pertinent medications: lasix 20 mg PO daily.   Goal of Therapy: Electrolytes WNL  Plan:  Electrolytes WNL Will follow AM labs and replace electrolytes as needed  Thank you for allowing pharmacy to be a part of this patient's care.  Deshanae Lindo Rodriguez-Guzman PharmD, BCPS 07/04/2023 9:37 AM

## 2023-07-04 NOTE — TOC Progression Note (Signed)
Transition of Care [facility]) - Progression Note    Patient Details  Name: Jeremy [name] MRN: [id number] Date of Birth: Jul 18, 1951  Transition of Care [facility]) CM/SW Contact  Truddie Hidden, RN Phone Number: 07/04/2023, 12:05 PM  Clinical Narrative:    Spoke with patient and his wife at the bedside regarding discharge plans for home. His wife is requesting a hospital bed and a hoyer lift. She stated she would also like to speak with the palliative representative from [facility].  Patient wife stated her existing furniture where the hospital bed will be place has to be moved. Her family will come and move the furniture this evening.  Hoyer bed and hospital bed requested via Jon from [employer] .   Renea Ee from [place] notified of wife request to speak with her.      Barriers to Discharge: Continued Medical Work up  Expected Discharge Plan and Services     Post Acute Care Choice: Home Health, Skilled Nursing Facility Living arrangements for the past 2 months: Single Family Home                                       Social Determinants of Health (SDOH) Interventions SDOH Screenings   Food Insecurity: No Food Insecurity (06/21/2023)  Housing: Low Risk  (06/21/2023)  Transportation Needs: No Transportation Needs (06/21/2023)  Utilities: Not At Risk (06/21/2023)  Alcohol Screen: Low Risk  (11/16/2021)  Depression (PHQ2-9): Low Risk  (05/27/2023)  Financial Resource Strain: Low Risk  (11/19/2022)  Physical Activity: Insufficiently Active (11/19/2022)  Social Connections: Moderately Isolated (11/19/2022)  Stress: No Stress Concern Present (11/19/2022)  Tobacco Use: Medium Risk (06/21/2023)    Readmission Risk Interventions    11/25/2022   10:25 AM 02/04/2022    9:47 AM  Readmission Risk Prevention Plan  Transportation Screening Complete Complete  PCP or Specialist Appt within 3-5 Days Complete Complete  HRI or Home Care Consult Complete   Social Work Consult for Recovery Care  Planning/Counseling Complete Complete  Palliative Care Screening Not Applicable Not Applicable  Medication Review Oceanographer) Complete Complete

## 2023-07-04 NOTE — Progress Notes (Signed)
Daily Progress Note   Patient Name: Jeremy [name]       Date: 07/04/2023 DOB: 1951/04/02  Age: 72 y.o. MRN#: [id number] Attending Physician: Marcelino Duster, MD Primary Care Physician: Duanne Limerick, MD Admit Date: 06/21/2023  Reason for Consultation/Follow-up: Establishing goals of care  Subjective: Notes and labs reviewed.  In to see patient.  He is currently sitting in bed with his wife at bedside.  He denies complaint but does not speak otherwise.  Wife discusses that they are working towards discharge home.  She states she is waiting for patient to work with PT/OT so that she will know how to prepare for his homecoming depending on what kind of assistance he needs. As per notes, [facility] will be following for outpatient palliative; agree with plans for outpatient palliative.  Length of Stay: 13  Current Medications: Scheduled Meds:   acidophilus  1 capsule Oral Daily   vitamin C  500 mg Oral Daily   fluticasone  1 spray Each Nare Daily   folic acid  1 mg Oral Daily   furosemide  20 mg Oral Daily   gemfibrozil  600 mg Oral BID AC   guaiFENesin  600 mg Oral BID   insulin aspart  0-5 Units Subcutaneous QHS   insulin aspart  0-9 Units Subcutaneous TID WC   insulin glargine-yfgn  7 Units Subcutaneous QHS   Interferon Beta-1b  0.25 mg Subcutaneous QODAY   iron polysaccharides  150 mg Oral Daily   loratadine  10 mg Oral Daily   nystatin   Topical TID   mouth rinse  15 mL Mouth Rinse 4 times per day   pantoprazole  40 mg Oral BID   pregabalin  200 mg Oral BID   sertraline  25 mg Oral Daily   simvastatin  40 mg Oral q1800   tamsulosin  0.4 mg Oral QPC supper   Vitamin D (Ergocalciferol)  50,000 Units Oral Q7 days    Continuous Infusions:  sodium chloride Stopped (06/27/23  2352)    PRN Meds: sodium chloride, acetaminophen, albuterol, guaiFENesin-dextromethorphan, hydrALAZINE, loperamide, ondansetron (ZOFRAN) IV, mouth rinse, traMADol  Physical Exam Pulmonary:     Effort: Pulmonary effort is normal.  Neurological:     Mental Status: He is alert.  Vital Signs: BP (!) 132/58 (BP Location: Right Arm)   Pulse 62   Temp 98.1 F (36.7 C)   Resp 16   Ht 5\' 10"  (1.778 m)   Wt 97.2 kg   SpO2 94%   BMI 30.75 kg/m  SpO2: SpO2: 94 % O2 Device: O2 Device: Nasal Cannula O2 Flow Rate: O2 Flow Rate (L/min): 4.5 L/min  Intake/output summary:  Intake/Output Summary (Last 24 hours) at 07/04/2023 1159 Last data filed at 07/04/2023 0900 Gross per 24 hour  Intake 788.73 ml  Output 1375 ml  Net -586.27 ml   LBM: Last BM Date : 07/03/23 Baseline Weight: Weight: 102.4 kg Most recent weight: Weight: 97.2 kg    Patient Active Problem List   Diagnosis Date Noted   COPD exacerbation (HCC) 06/21/2023   Obesity (BMI 30-39.9) 06/21/2023   Hyponatremia 06/21/2023   Pleural effusion 06/21/2023   Urinary retention 06/21/2023   Myocardial injury 05/11/2023   Moderate pulmonary hypertension (HCC) 05/09/2023   Cirrhosis of liver without ascites (HCC) 05/08/2023   Acute on chronic diastolic CHF (congestive heart failure) (HCC) 05/08/2023   Symptomatic anemia 05/06/2023   GI bleed 11/24/2022   Arteriovenous malformation (AVM) 10/21/2022   Generalized weakness 08/18/2022   AKI (acute kidney injury) (HCC) 08/18/2022   Severe sepsis (HCC) 02/04/2022   Right lower lobe pneumonia 02/03/2022   Acute on chronic respiratory failure with hypoxia (HCC) 02/03/2022   Hyperkalemia 02/03/2022   COPD (chronic obstructive pulmonary disease) (HCC) 02/03/2022   SIRS (systemic inflammatory response syndrome), possible sepsis (HCC) 02/03/2022   Chronic blood loss anemia 09/24/2021   Gastric polyp    Gastritis without bleeding    Anemia in chronic kidney disease (CKD)  06/19/2021   Iron deficiency anemia due to chronic blood loss 06/19/2021   Gait abnormality 05/07/2021   Stage 3a chronic kidney disease (HCC) 05/20/2020   DM type 2 with diabetic peripheral neuropathy (HCC) 05/06/2020   Idiopathic peripheral neuropathy 01/15/2020   Generalized edema 01/15/2020   Primary pulmonary hypertension (HCC) 10/28/2019   Therapeutic drug monitoring 03/23/2018   Mild episode of recurrent major depressive disorder (HCC) 09/26/2017   Ventricular ectopic beats 09/26/2017   Type 2 diabetes mellitus with diabetic polyneuropathy, with long-term current use of insulin (HCC) 08/12/2017   Hyperlipidemia due to type 2 diabetes mellitus (HCC) 08/12/2017   B12 deficiency 12/14/2016   Low back pain 03/10/2016   Lumbosacral disc disease 01/01/2016   Neuropathy associated with endocrine disorder (HCC) 11/19/2015   Iron deficiency anemia 06/03/2015   Essential hypertension 06/03/2015   Hyperlipidemia 06/03/2015   Depression 06/03/2015   Gastroesophageal reflux disease without esophagitis 06/03/2015   Edema extremities 06/03/2015   Multiple sclerosis (HCC) 07/26/2013   Abnormality of gait 07/26/2013   Morbid obesity (HCC) 07/26/2013    Palliative Care Assessment & Plan     Recommendations/Plan: Plans in place for outpatient palliative services; agree with this plan.   Code Status:    Code Status Orders  (From admission, onward)           Start     Ordered   06/27/23 0840  Do not attempt resuscitation (DNR)- Limited -Do Not Intubate (DNI)  (Code Status)  Continuous       Question Answer Comment  If pulseless and not breathing No CPR or chest compressions.   In Pre-Arrest Conditions (Patient Is Breathing and Has A Pulse) Do not intubate. Provide all appropriate non-invasive medical interventions. Avoid ICU transfer unless indicated or required.   Consent:  Discussion documented in EHR or advanced directives reviewed      06/27/23 0839           Code  Status History     Date Active Date Inactive Code Status Order ID Comments User Context   06/21/2023 1847 06/27/2023 0839 Full Code [id number]  Lorretta Harp, MD ED   05/06/2023 1248 05/20/2023 1852 Full Code [id number]  CoxNadyne Coombes, DO ED   11/24/2022 1614 11/26/2022 1629 Full Code [id number]  Emeline General, MD ED   10/16/2022 1846 10/17/2022 2119 Full Code [id number]  Verdene Lennert, MD ED   08/18/2022 1433 08/19/2022 1912 DNR [id number]  Lucile Shutters, MD ED   02/03/2022 2207 02/05/2022 1725 Full Code [id number]  Andris Baumann, MD ED   09/24/2021 1432 09/27/2021 1951 Full Code [id number]  Lucile Shutters, MD ED      Thank you for allowing the Palliative Medicine Team to assist in the care of this patient.   Morton Stall, NP  Please contact Palliative Medicine Team phone at [phone] for questions and concerns.

## 2023-07-04 NOTE — Care Management Important Message (Signed)
Important Message  Patient Details  Name: Jeremy [name] MRN: [id number] Date of Birth: [birth year]   Medicare Important Message Given:  Yes     [name] 07/04/2023, 11:15 AM

## 2023-07-04 NOTE — Progress Notes (Signed)
Progress Note   Patient: Jeremy [name] XBJ:[id number] DOB: [birth year] DOA: 06/21/2023     13 DOS: the patient was seen and examined on 07/04/2023   Brief hospital course: Jeremy [name] is a 72 y.o. male with medical history significant of HTN, HLD, DM, chronic respiratory failure, COPD on 4L O2, dCHF, GIB, depression, MS (wheelchair-bound), pulmonary hypertension, chronic pain, liver cirrhosis, obesity, anemia, who presents with SOB. Patient was recently hospitalized from 7/12 - 7/26 due to CHF exacerbation and right lobar pneumonia. Patient usually on 4 L supplemental oxygen is currently requiring 8 L.  Patient has been having worsening shortness of breath and leg swelling with 13lb  weight gain.  Patient was started on IV Lasix, IV Solu-Medrol and nebulizer therapy along with IV antibiotics. Oxygen gradually weaned off. PT evaluated him advised hospital bed, hoyer lift, need palliative care follow up outpatient. Wife working on discharge needs.   Assessment and Plan: Acute on chronic hypoxic respiratory failure most likely due to CHF exacerbation, Multifocal pneumonia  Patient uses 4 L oxygen at baseline currently on 8-9 L.  Continue to wean oxygen as tolerated to maintain o2 saturation around 92%. CT chest: Bilateral lung collapse, multifocal pneumonia, bilateral effusion R >L. 9/1 started Unasyn which I stopped today. He has been having diarrhea. Finished doxycycline 100 mg IV twice daily for aspiration Total duration of antibiotics 7 days. 9/2 patient's respiratory status got worse on 9/1, moved to stepdown unit, PCCM on board. Encourage incentive spirometry. 9/5 moved out of step down,  Weaned O2 to his baseline 4L, maintains 90-92% saturation. With mild exertion sats drop but quickly recovers to 91. Continue oral Lasix for diuresis.   Diastolic CHF exacerbation and anasarca with significant lower extremity edema Patient presented with shortness of breath and LE edema S/p Lasix 40 mg IV twice  daily transitioned to IV infusion changed to oral 20mg  daily per nephrology. S/p albumin infusions Continue to aim for net negative output. 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  As per patient's wife patient cannot have more procedures done due to respiratory failure and he may not wake up.  Patient and his wife would prefer conservative management.  8/30 Hb 6.9, 1 unit PRBC transfused. Hb around 8 and stable.  Stage 3a chronic kidney disease  Recent baseline 0.90 1.2-1.4.   Creatinine stable on current diuresis Monitor renal function and urine output S/p IV albumin as above Nephrology on board, follow further recommendations.   COPD exacerbation, most likely due to volume overload s/p Solu-Medrol 40 mg IV BID, DC'd on 8/29  S/p  doxycycline 100 mg p.o. twice daily d/c'd on 8/30 Started Mucinex 600 mg p.o. twice daily Continue Robitussin DM as needed. Will prescribe nebulizer machine for duonebs PRN.   Pleural effusion Moderate right pleural effusion most likely due to CHF, liver cirrhosis with hypoalbuminemia. Continue diuresis as above 8/30 s/p thoracentesis 1.2 L fluid was tapped by IR, postprocedure x-ray did not show any complications. Fluid studies, as per lights criteria, LDH elevated, consistent with exudative but clinically no signs of infection. Started Abx due to CT scan findings of multifocal pneumonia, bilateral  effusion, right is moderate. 9/3 s/p repeat right thoracentesis, 400 mL fluid was removed.   Type 2 diabetes mellitus- Continue Semglee 7 units nightly Accucheks  with sliding scale insulin. Continue diabetic diet Hypoglycemia protocol.   Essential hypertension IV hydralazine as needed Continue IV Lasix   Hyperlipidemia Zocor and lopid   Multiple sclerosis (HCC) pt is on Betaseron injection Fall precautions   Myocardial injury:  troponin level 21 --> -20.   No chest pain Continue Zocor   Folic acid deficiency, folate level 5.2 Daily folate supplement.   Hypokalemia-  secondary to diuresis, potassium repleted. Monitor electrolytes daily  Hypomagnesemia- Replete as needed and recheck.  Hypotonic hyponatremia: Resolved He got Sodium chloride 2 g p.o. 3 times daily for 5 days Continue fluid restriction 1200 ml/d Na stable at 144   Cirrhosis of liver without ascites (HCC): Mental status normal.  INR 1.3 Ammonia level 26 wnl   Chronic urinary retention: Foley removed , he is able to void. He is at high risk of retention. Bladder scan per nursing protocol. Continue Flomax. If persistent retention, replace foley with outpatient urology follow up.                                                                                                                                                                                                                                                                                                               Depression Continue home medication, Zoloft   Vitamin D deficiency: started vitamin D 50,000 units p.o. weekly, follow with PCP to repeat vitamin D level after 3 to 6 months.   Obesity Body mass index is 30.68 kg/m.  Diet, exercise and weight reduction advised.   Diet: Heart healthy/carb modified, fluid striction 1200 mL/day DVT Prophylaxis: Subcutaneous Lovenox  Palliative team on board. He wished for DNR- limited intervention. He is leaning towards comfort measure only, palliative team to see him as outpatient. He wishes to go home with [facility] and refuses SNF placement. Wife plan to make  arrangement at home for  discharge. Hospital bed, hoyer lift ordered. TOC working on discharge, possibly tomorrow.       Subjective: Patient is seen and examined today morning.  Currently on 4L supplemental oxygen. PT worked with him, get hypoxic even with mild exertion. Wife would like to make arrangements for discharge tomorrow when hospital bed with hoyer is arranged. No overnight issues.  Physical Exam: Vitals:   07/04/23 0420 07/04/23 0734 07/04/23 1150 07/04/23 1246  BP:  (!) 132/58  (!) 146/76  Pulse:  62  73  Resp:  16  14  Temp:  98.1 F (36.7 C)  98.1 F (36.7 C)  TempSrc:      SpO2:  96% 94% (!) 86%  Weight: 97.2 kg     Height:       General - Elderly obese Caucasian ill male, mild respiratory distress HEENT - PERRLA, EOMI, atraumatic head, non tender sinuses. Lung -diffuse rales, rhonchi. Heart - S1, S2 heard, no murmurs, rubs, 1+ pedal edema Neuro - Alert, awake and oriented x 3, non focal exam. Skin - Warm and dry. Data Reviewed:     Latest Ref Rng & Units 07/02/2023    7:09 AM 07/01/2023    3:06 AM 06/30/2023    6:00 AM  CBC  WBC 4.0 - 10.5 K/uL 5.8  5.5  5.1   Hemoglobin 13.0 - 17.0 g/dL 8.8  8.8  8.4   Hematocrit 39.0 - 52.0 % 28.1  28.5  26.6   Platelets 150 - 400 K/uL 198  221  204        Latest Ref Rng & Units 07/04/2023    3:12 AM 07/02/2023    7:09 AM 07/01/2023    3:06 AM  BMP  Glucose 70 - 99 mg/dL 98  78  295   BUN 8 - 23 mg/dL 41  35  45   Creatinine 0.61 - 1.24 mg/dL 6.21  3.08  6.57   Sodium 135 - 145 mmol/L 142  143  144   Potassium 3.5 - 5.1 mmol/L 4.1  3.2  4.0   Chloride 98 - 111 mmol/L 107  105  104   CO2 22 - 32 mmol/L 29  28  30    Calcium 8.9 - 10.3 mg/dL 8.1  7.9  8.1    No results found.  Family Communication: Wife at bedside, updated regarding current care plan  Disposition: Status is: Inpatient Remains inpatient appropriate because: close monitoring of respiratory status, input and output  Planned Discharge Destination: Home with  Home Health, Hospital bed with hoyer lift. Wife to displace furniture for discharge tomorrow.    Time spent .  Author: Marcelino Duster, MD 07/04/2023 3:31 PM  For on call review [email or web address].

## 2023-07-04 NOTE — Progress Notes (Signed)
Physical Therapy Treatment Patient Details Name: Jeremy [name] MRN: [id number] DOB: 30-Jun-1951 Today's Date: 07/04/2023   History of Present Illness Pt is a 72 year old male presenting to ED with SOB, admitted with acute on chronic respiratory failure due to CHF and COPD exacerbation, pleural effusion     PMH significant for HTN, HLD, DM, chronic respiratory failure, COPD on 4L O2, dCHF, GIB, depression, MS (wheelchair-bound), pulmonary hypertension, chronic pain, liver cirrhosis, obesity, anemia.    PT Comments  Pt is seen by OT and PT for a co-treatment. Pt is received in bed with spouse at bedside, he is agreeable to session. Pt performs bed mobility max A-total A x2 with inability to progress mobility secondary to safety concerns and desaturation. Attempted STS during today's session due to spouse's request but unable to achieve task due to poor seated tolerance and safety concerns. During prolonged EOB sitting, Pt desaturated to 73% SpO2 requiring an increase in O2 support from 4L [place] to 4.5L [place] which improved sats to low 90s%. Educated Pt and spouse heavily on safety concerns with using [place] for transfers due to poor seated tolerance and generalized weakness, benefits of using hoyer lift, and use of hospital bed to decrease caregiver burden-Pt and spouse verbalized understanding and agreement. Pt would benefit from cont skilled PT to address above deficits and promote optimal return to PLOF.     If plan is discharge home, recommend the following: Two people to help with walking and/or transfers;Two people to help with bathing/dressing/bathroom;Assist for transportation;Assistance with feeding;Help with stairs or ramp for entrance   Can travel by private vehicle     No  Equipment Recommendations  Hoyer lift;Hospital bed    Recommendations for Other Services       Precautions / Restrictions Precautions Precautions: Fall Precaution Comments: watch O2 for desat Restrictions Weight  Bearing Restrictions: No     Mobility  Bed Mobility Overal bed mobility: Needs Assistance Bed Mobility: Supine to Sit, Sit to Supine     Supine to sit: Max assist, +2 for physical assistance, +2 for safety/equipment, HOB elevated, Used rails Sit to supine: Max assist, +2 for physical assistance, +2 for safety/equipment, Used rails, Total assist   General bed mobility comments: Reports 2-3/10 modified Borg for fatigue; Able to initiate bed mobility max A x2 increading to total A x2 to complete task. Cont heavy posterior push during transition into sitting EOB.    Transfers Overall transfer level: Needs assistance Equipment used: None              Lateral/Scoot Transfers: Max assist, +2 physical assistance, +2 safety/equipment General transfer comment: Unable to formally assess due to poor activity tolerance and safety concerns    Ambulation/Gait               General Gait Details: Not assessed due to Pt not ambulatory at baseline   Stairs             Wheelchair Mobility     Tilt Bed    Modified Rankin (Stroke Patients Only)       Balance Overall balance assessment: Needs assistance Sitting-balance support: Bilateral upper extremity supported, Feet supported Sitting balance-Leahy Scale: Poor Sitting balance - Comments: Pt able to maintain seated EOB balance CGA with BUE support on bed for approx 2-3 minutes, and then required frequent max A secondary to heavy posterior/L trunk lean and decrease activity tolerance Postural control: Posterior lean, Left lateral lean   Standing balance-Leahy Scale: Zero Standing balance  comment: Not assessed due to safety concerns                            Cognition Arousal: Lethargic Behavior During Therapy: WFL for tasks assessed/performed Overall Cognitive Status: Impaired/Different from baseline Area of Impairment: Problem solving                             Problem Solving: Slow  processing General Comments: Pleasant and cooperative for session but appears fatigue        Exercises Other Exercises Other Exercises: Edu Pt and spouse about safety concerns regarding use of chair lift secondary to poor activity tolerance, benefits of hoyer lift and transfer safety    General Comments General comments (skin integrity, edema, etc.): Vitals assessed throughout session. During prolonged EOB sitting Pt desaturated to 73% requiring an increase of O2 support to 4.5L [place] which increased sats to low-90s. Overall, Pt unable to sustain seated position for prolonged time secondary to poor activity tolerance. Pt reports 2/10 NPS on modified Borg for fatigue.      Pertinent Vitals/Pain Pain Assessment Pain Assessment: No/denies pain    Home Living                          Prior Function            PT Goals (current goals can now be found in the care plan section) Acute Rehab PT Goals PT Goal Formulation: With patient Time For Goal Achievement: 07/06/23 Potential to Achieve Goals: Fair Progress towards PT goals: Progressing toward goals    Frequency    Min 1X/week      PT Plan      Co-evaluation PT/OT/SLP Co-Evaluation/Treatment: Yes Reason for Co-Treatment: Complexity of the patient's impairments (multi-system involvement);Necessary to address cognition/behavior during functional activity;For patient/therapist safety PT goals addressed during session: Mobility/safety with mobility        AM-PAC PT "6 Clicks" Mobility   Outcome Measure  Help needed turning from your back to your side while in a flat bed without using bedrails?: A Lot Help needed moving from lying on your back to sitting on the side of a flat bed without using bedrails?: A Lot Help needed moving to and from a bed to a chair (including a wheelchair)?: Total Help needed standing up from a chair using your arms (e.g., wheelchair or bedside chair)?: Total Help needed to walk in  hospital room?: Total Help needed climbing 3-5 steps with a railing? : Total 6 Click Score: 8    End of Session Equipment Utilized During Treatment: Oxygen Activity Tolerance: Patient limited by fatigue Patient left: in bed;with call bell/phone within reach;with family/visitor present;with SCD's reapplied;with bed alarm set Nurse Communication: Mobility status PT Visit Diagnosis: Unsteadiness on feet (R26.81);Muscle weakness (generalized) (M62.81);Difficulty in walking, not elsewhere classified (R26.2)     Time: 1610-9604 PT Time Calculation (min) (ACUTE ONLY): 29 min  Charges:                            Elmon Else, SPT    Abigale Dorow 07/04/2023, 12:09 PM

## 2023-07-04 NOTE — Plan of Care (Signed)
  Problem: Fluid Volume: Goal: Ability to maintain a balanced intake and output will improve Outcome: Progressing   Problem: Nutritional: Goal: Maintenance of adequate nutrition will improve Outcome: Progressing Goal: Progress toward achieving an optimal weight will improve Outcome: Progressing   Problem: Metabolic: Goal: Ability to maintain appropriate glucose levels will improve Outcome: Progressing   Problem: Skin Integrity: Goal: Risk for impaired skin integrity will decrease Outcome: Progressing   Problem: Elimination: Goal: Will not experience complications related to bowel motility Outcome: Progressing Goal: Will not experience complications related to urinary retention Outcome: Progressing   Problem: Pain Managment: Goal: General experience of comfort will improve Outcome: Progressing   Problem: Clinical Measurements: Goal: Ability to maintain clinical measurements within normal limits will improve Outcome: Progressing Goal: Will remain free from infection Outcome: Progressing Goal: Diagnostic test results will improve Outcome: Progressing Goal: Respiratory complications will improve Outcome: Progressing Goal: Cardiovascular complication will be avoided Outcome: Progressing

## 2023-07-05 ENCOUNTER — Encounter: Payer: Medicare Other | Admitting: Student in an Organized Health Care Education/Training Program

## 2023-07-05 DIAGNOSIS — F330 Major depressive disorder, recurrent, mild: Secondary | ICD-10-CM

## 2023-07-05 DIAGNOSIS — I5033 Acute on chronic diastolic (congestive) heart failure: Secondary | ICD-10-CM | POA: Diagnosis not present

## 2023-07-05 DIAGNOSIS — I272 Pulmonary hypertension, unspecified: Secondary | ICD-10-CM | POA: Diagnosis not present

## 2023-07-05 DIAGNOSIS — J9621 Acute and chronic respiratory failure with hypoxia: Secondary | ICD-10-CM | POA: Diagnosis not present

## 2023-07-05 DIAGNOSIS — J441 Chronic obstructive pulmonary disease with (acute) exacerbation: Secondary | ICD-10-CM | POA: Diagnosis not present

## 2023-07-05 LAB — GLUCOSE, CAPILLARY
Glucose-Capillary: 82 mg/dL (ref 70–99)
Glucose-Capillary: 111 mg/dL — ABNORMAL HIGH (ref 70–99)

## 2023-07-05 MED ORDER — GUAIFENESIN ER 600 MG PO TB12: 600.0000 mg | ORAL_TABLET | Freq: Two times a day (BID) | ORAL | 0 refills | Status: DC

## 2023-07-05 MED ORDER — FOLIC ACID 1 MG PO TABS: 1.0000 mg | ORAL_TABLET | Freq: Every day | ORAL | 3 refills | Status: DC

## 2023-07-05 MED ORDER — FLUTICASONE PROPIONATE 50 MCG/ACT NA SUSP: 1.0000 | Freq: Every day | NASAL | 2 refills | Status: DC

## 2023-07-05 MED ORDER — VITAMIN D (ERGOCALCIFEROL) 1.25 MG (50000 UNIT) PO CAPS: 50000.0000 [IU] | ORAL_CAPSULE | ORAL | 0 refills | Status: DC

## 2023-07-05 MED ORDER — ASCORBIC ACID 500 MG PO TABS: 500.0000 mg | ORAL_TABLET | Freq: Every day | ORAL | 3 refills | Status: DC

## 2023-07-05 MED ORDER — POLYSACCHARIDE IRON COMPLEX 150 MG PO CAPS: 150.0000 mg | ORAL_CAPSULE | Freq: Every day | ORAL | 3 refills | Status: DC

## 2023-07-05 MED ORDER — IPRATROPIUM-ALBUTEROL 0.5-2.5 (3) MG/3ML IN SOLN: 3.0000 mL | RESPIRATORY_TRACT | 2 refills | Status: DC | PRN

## 2023-07-05 MED ORDER — FUROSEMIDE 40 MG PO TABS: 20.0000 mg | ORAL_TABLET | ORAL | Status: DC

## 2023-07-05 MED ORDER — INSULIN GLARGINE 100 UNIT/ML SOLOSTAR PEN: 7.0000 [IU] | PEN_INJECTOR | Freq: Every day | SUBCUTANEOUS | Status: DC

## 2023-07-05 NOTE — TOC Transition Note (Addendum)
Transition of Care [facility]) - CM/SW Discharge Note   Patient Details  Name: Jeremy [name] MRN: [id number] Date of Birth: October 02, 1951  [facility] CM/SW Contact:  Marquita Palms, LCSW Phone Number: 07/05/2023, 2:04 PM   Clinical Narrative:     Pt DC today by EMS Pt going home with [facility]  Final next level of care: Home w Home Health Services Barriers to Discharge: No Barriers Identified   Patient Goals and CMS Choice CMS Medicare.gov Compare Post Acute Care list provided to:: Patient Choice offered to / list presented to : Patient  Discharge Placement                  Patient to be transferred to facility by: [facility] Name of family member notified: Jeremy [name] (wife) Patient and family notified of of transfer: 07/05/23  Discharge Plan and Services Additional resources added to the After Visit Summary for       Post Acute Care Choice: Home Health, Skilled Nursing Facility                    HH Arranged: PT [facility] Agency: [facility] Date [facility] Agency Contacted: 07/05/23      Social Determinants of Health (SDOH) Interventions SDOH Screenings   Food Insecurity: No Food Insecurity (06/21/2023)  Housing: Low Risk  (06/21/2023)  Transportation Needs: No Transportation Needs (06/21/2023)  Utilities: Not At Risk (06/21/2023)  Alcohol Screen: Low Risk  (11/16/2021)  Depression (PHQ2-9): Low Risk  (05/27/2023)  Financial Resource Strain: Low Risk  (11/19/2022)  Physical Activity: Insufficiently Active (11/19/2022)  Social Connections: Moderately Isolated (11/19/2022)  Stress: No Stress Concern Present (11/19/2022)  Tobacco Use: Medium Risk (06/21/2023)     Readmission Risk Interventions    11/25/2022   10:25 AM 02/04/2022    9:47 AM  Readmission Risk Prevention Plan  Transportation Screening Complete Complete  PCP or Specialist Appt within 3-5 Days Complete Complete  HRI or Home Care Consult Complete   Social Work Consult for Recovery Care  Planning/Counseling Complete Complete  Palliative Care Screening Not Applicable Not Applicable  Medication Review Oceanographer) Complete Complete

## 2023-07-05 NOTE — Progress Notes (Signed)
[facility]  ROUNDING NOTE   Subjective:   Jeremy [name] is a 72 year old male with past medical conditions including hyperlipidemia, diabetes, hypertension, respiratory failure, COPD with 4 L nasal cannula at baseline, depression, diastolic heart failure, multiple sclerosis, liver cirrhosis, anemia, pulmonary hypertension, and chronic kidney disease stage IIIa.  Patient presents to the emergency department complaining of shortness of breath and has been admitted for Acute on chronic respiratory failure with hypoxia [facility]) [J96.21]  Patient is known to our practice from outpatient follow-up.  Patient was last seen in office on 07/01/2022 for routine follow-up.    Patient seen laying in bed Wife at bedside States he will discharge later today  Creatinine stable   Objective:  Vital signs in last 24 hours:  Temp:  [97.4 F (36.3 C)-98.3 F (36.8 C)] 98.3 F (36.8 C) (09/10 1207) Pulse Rate:  [61-72] 70 (09/10 1207) Resp:  [16-20] 20 (09/10 1207) BP: (127-151)/(55-69) 151/66 (09/10 1207) SpO2:  [89 %-97 %] 94 % (09/10 1207) Weight:  [97.5 kg] 97.5 kg (09/10 0432)  Weight change: 0.3 kg Filed Weights   07/03/23 0605 07/04/23 0420 07/05/23 0432  Weight: 95 kg 97.2 kg 97.5 kg    Intake/Output: I/O last 3 completed shifts: In: 1343.7 [P.O.:1305; I.V.:38.7] Out: 1500 [Urine:1500]   Intake/Output this shift:  No intake/output data recorded.  Physical Exam: General: NAD  Head: Normocephalic, atraumatic. Moist oral mucosal membranes  Eyes: Anicteric  Lungs:  Diminished, [place]  Heart: Regular rate and rhythm  Abdomen:  Soft, nontender, nondistended  Extremities: + dependent peripheral edema.  Neurologic: Alert and oriented, able to follow simple commands  Skin: No lesions       Basic Metabolic Panel: Recent Labs  Lab 06/29/23 0335 06/30/23 0600 07/01/23 0306 07/02/23 0709 07/04/23 0312  NA 144 142 144 143 142  K 3.4* 3.6 4.0 3.2* 4.1  CL 103 102 104 105 107   CO2 29 29 30 28 29   GLUCOSE 85 91 104* 78 98  BUN 39* 40* 45* 35* 41*  CREATININE 1.33* 1.23 1.41* 1.26* 1.28*  CALCIUM 7.9* 7.9* 8.1* 7.9* 8.1*  MG 1.6* 2.1  --   --   --   PHOS 3.2 3.3  --   --   --     Liver Function Tests: No results for input(s): "AST", "ALT", "ALKPHOS", "BILITOT", "PROT", "ALBUMIN" in the last 168 hours.  No results for input(s): "LIPASE", "AMYLASE" in the last 168 hours. No results for input(s): "AMMONIA" in the last 168 hours.   CBC: Recent Labs  Lab 06/28/23 1603 06/29/23 0335 06/30/23 0600 07/01/23 0306 07/02/23 0709  WBC  --  5.7 5.1 5.5 5.8  HGB 9.6* 8.4* 8.4* 8.8* 8.8*  HCT 30.5* 27.1* 26.6* 28.5* 28.1*  MCV  --  88.3 86.4 88.8 87.8  PLT  --  232 204 221 198    Cardiac Enzymes: No results for input(s): "CKTOTAL", "CKMB", "CKMBINDEX", "TROPONINI" in the last 168 hours.  BNP: Invalid input(s): "POCBNP"  CBG: Recent Labs  Lab 07/04/23 1249 07/04/23 1529 07/04/23 2050 07/05/23 0725 07/05/23 1203  GLUCAP 119* 132* 204* 82 111*    Microbiology: Results for orders placed or performed during the hospital encounter of 06/21/23  Culture, blood (Routine x 2)     Status: None   Collection Time: 06/21/23  3:02 PM   Specimen: BLOOD  Result Value Ref Range Status   Specimen Description BLOOD LEFT ANTECUBITAL  Final   Special Requests   Final  BOTTLES DRAWN AEROBIC AND ANAEROBIC Blood Culture adequate volume   Culture   Final    NO GROWTH 5 DAYS Performed at [facility], [address] [place], [place] [address]    Report Status 06/26/2023 FINAL  Final  Culture, blood (Routine x 2)     Status: None   Collection Time: 06/21/23  3:13 PM   Specimen: BLOOD  Result Value Ref Range Status   Specimen Description BLOOD RIGHT ANTECUBITAL  Final   Special Requests   Final    BOTTLES DRAWN AEROBIC AND ANAEROBIC Blood Culture adequate volume   Culture   Final    NO GROWTH 5 DAYS Performed at [facility], [address] [place], [place] [address]    Report Status 06/26/2023 FINAL  Final  Resp panel by RT-PCR (RSV, Flu A&B, Covid) Anterior Nasal Swab     Status: None   Collection Time: 06/21/23  4:35 PM   Specimen: Anterior Nasal Swab  Result Value Ref Range Status   SARS Coronavirus 2 by RT PCR NEGATIVE NEGATIVE Final    Comment: (NOTE) SARS-CoV-2 target nucleic acids are NOT DETECTED.  The SARS-CoV-2 RNA is generally detectable in upper respiratory specimens during the acute phase of infection. The lowest concentration of SARS-CoV-2 viral copies this assay can detect is 138 copies/mL. A negative result does not preclude SARS-Cov-2 infection and should not be used as the sole basis for treatment or other patient management decisions. A negative result may occur with  improper specimen collection/handling, submission of specimen other than nasopharyngeal swab, presence of viral mutation(s) within the areas targeted by this assay, and inadequate number of viral copies(<138 copies/mL). A negative result must be combined with clinical observations, patient history, and epidemiological information. The expected result is Negative.  Fact Sheet for Patients:  BloggerCourse.com  Fact Sheet for Healthcare Providers:  SeriousBroker.it  This test is no t yet approved or cleared by the [place] FDA and  has been authorized for detection and/or diagnosis of SARS-CoV-2 by FDA under an Emergency Use Authorization (EUA). This EUA will remain  in effect (meaning this test can be used) for the duration of the COVID-19 declaration under Section 564(b)(1) of the Act, 21 U.S.C.section 360bbb-3(b)(1), unless the authorization is terminated  or revoked sooner.       Influenza A by PCR NEGATIVE NEGATIVE Final   Influenza B by PCR NEGATIVE NEGATIVE Final    Comment: (NOTE) The Xpert Xpress SARS-CoV-2/FLU/RSV plus assay is intended as an aid in the  diagnosis of influenza from Nasopharyngeal swab specimens and should not be used as a sole basis for treatment. Nasal washings and aspirates are unacceptable for Xpert Xpress SARS-CoV-2/FLU/RSV testing.  Fact Sheet for Patients: BloggerCourse.com  Fact Sheet for Healthcare Providers: SeriousBroker.it  This test is not yet approved or cleared by the [place] FDA and has been authorized for detection and/or diagnosis of SARS-CoV-2 by FDA under an Emergency Use Authorization (EUA). This EUA will remain in effect (meaning this test can be used) for the duration of the COVID-19 declaration under Section 564(b)(1) of the Act, 21 U.S.C. section 360bbb-3(b)(1), unless the authorization is terminated or revoked.     Resp Syncytial Virus by PCR NEGATIVE NEGATIVE Final    Comment: (NOTE) Fact Sheet for Patients: BloggerCourse.com  Fact Sheet for Healthcare Providers: SeriousBroker.it  This test is not yet approved or cleared by the [place] FDA and has been authorized for detection and/or diagnosis of SARS-CoV-2 by FDA under  an Emergency Use Authorization (EUA). This EUA will remain in effect (meaning this test can be used) for the duration of the COVID-19 declaration under Section 564(b)(1) of the Act, 21 U.S.C. section 360bbb-3(b)(1), unless the authorization is terminated or revoked.  Performed at [facility], [address] [place], [place] [address]   MRSA Next Gen by PCR, Nasal     Status: Abnormal   Collection Time: 06/21/23  8:14 PM   Specimen: Nasal Mucosa; Nasal Swab  Result Value Ref Range Status   MRSA by PCR Next Gen DETECTED (A) NOT DETECTED Final    Comment: RESULT CALLED TO, READ BACK BY AND VERIFIED WITH: KRISTY SNEEAD AT 2206 ON 06/21/23 BY SS (NOTE) The GeneXpert MRSA Assay (FDA approved for NASAL specimens only), is one component of a  comprehensive MRSA colonization surveillance program. It is not intended to diagnose MRSA infection nor to guide or monitor treatment for MRSA infections. Test performance is not FDA approved in patients less than 90 years old. Performed at [facility], [address] [place], [place] [address]   Respiratory (~20 pathogens) panel by PCR     Status: None   Collection Time: 06/22/23  3:22 PM   Specimen: Nasopharyngeal Swab; Respiratory  Result Value Ref Range Status   Adenovirus NOT DETECTED NOT DETECTED Final   Coronavirus 229E NOT DETECTED NOT DETECTED Final    Comment: (NOTE) The Coronavirus on the Respiratory Panel, DOES NOT test for the novel  Coronavirus (2019 nCoV)    Coronavirus HKU1 NOT DETECTED NOT DETECTED Final   Coronavirus NL63 NOT DETECTED NOT DETECTED Final   Coronavirus OC43 NOT DETECTED NOT DETECTED Final   Metapneumovirus NOT DETECTED NOT DETECTED Final   Rhinovirus / Enterovirus NOT DETECTED NOT DETECTED Final   Influenza A NOT DETECTED NOT DETECTED Final   Influenza B NOT DETECTED NOT DETECTED Final   Parainfluenza Virus 1 NOT DETECTED NOT DETECTED Final   Parainfluenza Virus 2 NOT DETECTED NOT DETECTED Final   Parainfluenza Virus 3 NOT DETECTED NOT DETECTED Final   Parainfluenza Virus 4 NOT DETECTED NOT DETECTED Final   Respiratory Syncytial Virus NOT DETECTED NOT DETECTED Final   Bordetella pertussis NOT DETECTED NOT DETECTED Final   Bordetella Parapertussis NOT DETECTED NOT DETECTED Final   Chlamydophila pneumoniae NOT DETECTED NOT DETECTED Final   Mycoplasma pneumoniae NOT DETECTED NOT DETECTED Final    Comment: Performed at [facility], [address], [place], [place] [address]  Body fluid culture w Gram Stain     Status: None   Collection Time: 06/24/23 12:12 PM   Specimen: PATH Cytology Peritoneal fluid  Result Value Ref Range Status   Specimen Description   Final    PERITONEAL Performed at [facility], [address], [place], [place] [address]    Special Requests   Final    NONE Performed at [facility], [address] [place], [place] [address]    Gram Stain NO WBC SEEN NO ORGANISMS SEEN   Final   Culture   Final    NO GROWTH 3 DAYS Performed at [facility], [address], [place], [place] [address]    Report Status 06/27/2023 FINAL  Final  MRSA Next Gen by PCR, Nasal     Status: Abnormal   Collection Time: 06/26/23  5:43 PM   Specimen: Nasal Mucosa; Nasal Swab  Result Value Ref Range Status   MRSA by PCR Next Gen DETECTED (A) NOT DETECTED  Final    Comment: RESULT CALLED TO, READ BACK BY AND VERIFIED WITH: [facility] WHITE 06/26/2023 2055 CP (NOTE) The GeneXpert MRSA Assay (FDA approved for NASAL specimens only), is one component of a comprehensive MRSA colonization surveillance program. It is not intended to diagnose MRSA infection nor to guide or monitor treatment for MRSA infections. Test performance is not FDA approved in patients less than 18 years old. Performed at [facility], [address] [place], [place] [address]   Body fluid culture w Gram Stain     Status: None   Collection Time: 06/28/23 12:25 PM   Specimen: PATH Cytology Pleural fluid  Result Value Ref Range Status   Specimen Description   Final    PLEURAL Performed at [facility], [address], [place], [place] [address]    Special Requests   Final    NONE Performed at [facility], [address] [place], [place] [address]    Gram Stain NO WBC SEEN NO ORGANISMS SEEN   Final   Culture   Final    NO GROWTH 3 DAYS Performed at [facility], [address], [place], [place] [address]    Report Status 07/02/2023 FINAL  Final    Coagulation Studies: No results for input(s): "LABPROT", "INR" in the last 72 hours.   Urinalysis: No results for input(s): "COLORURINE", "LABSPEC", "PHURINE", "GLUCOSEU", "HGBUR", "BILIRUBINUR", "KETONESUR", "PROTEINUR",  "UROBILINOGEN", "NITRITE", "LEUKOCYTESUR" in the last 72 hours.  Invalid input(s): "APPERANCEUR"    Imaging: No results found.   Medications:    sodium chloride Stopped (06/27/23 2352)    acidophilus  1 capsule Oral Daily   vitamin C  500 mg Oral Daily   fluticasone  1 spray Each Nare Daily   folic acid  1 mg Oral Daily   furosemide  20 mg Oral Daily   gemfibrozil  600 mg Oral BID AC   guaiFENesin  600 mg Oral BID   insulin aspart  0-5 Units Subcutaneous QHS   insulin aspart  0-9 Units Subcutaneous TID WC   insulin glargine-yfgn  7 Units Subcutaneous QHS   Interferon Beta-1b  0.25 mg Subcutaneous QODAY   iron polysaccharides  150 mg Oral Daily   loratadine  10 mg Oral Daily   nystatin   Topical TID   mouth rinse  15 mL Mouth Rinse 4 times per day   pantoprazole  40 mg Oral BID   pregabalin  200 mg Oral BID   sertraline  25 mg Oral Daily   simvastatin  40 mg Oral q1800   tamsulosin  0.4 mg Oral QPC supper   Vitamin D (Ergocalciferol)  50,000 Units Oral Q7 days   sodium chloride, acetaminophen, albuterol, guaiFENesin-dextromethorphan, hydrALAZINE, loperamide, ondansetron (ZOFRAN) IV, mouth rinse, traMADol  Assessment/ Plan:  Mr. Deiontae Eunice is a 72 y.o.  male with past medical conditions including hyperlipidemia, diabetes, hypertension, respiratory failure, COPD with 4 L nasal cannula at baseline, depression, diastolic heart failure, multiple sclerosis, liver cirrhosis, anemia, pulmonary hypertension, and chronic kidney disease stage IIIa.  Patient presents to the emergency department complaining of shortness of breath and has been admitted for Acute on chronic respiratory failure with hypoxia (HCC) [J96.21]   Chronic kidney disease stage 2,. Chronic kidney disease is secondary to diabetes, hypertension, advanced age -Continue oral furosemide 20 mg daily at discharge. -  Patient has follow up appt in our office on 07/18/23 with Dr Thedore Mins. Will recheck labs at that  time  Lab Results  Component Value Date   CREATININE 1.28 (H) 07/04/2023   CREATININE 1.26 (H) 07/02/2023   CREATININE 1.41 (H) 07/01/2023    Intake/Output Summary (Last 24 hours) at 07/05/2023 1410 Last data filed at 07/05/2023 0655 Gross per 24 hour  Intake 705 ml  Output 600 ml  Net 105 ml   2.  Volume overload, appears multifactorial with contributing factors of COPD   CT angiogram from 06/26/2023-large right pleural effusion. Right thoracentesis done on 06/28/2023 with 400 cc fluid removed. Volume greatly improved since admission.  Continue current diuretic regimen  3. Anemia of chronic kidney disease Lab Results  Component Value Date   HGB 8.8 (L) 07/02/2023    Patient has received blood transfusion during this admission on 06/23/2023.  Hemoglobin slightly below desired range.  Will monitor for now  4.  Diabetes mellitus type II with chronic kidney disease/renal manifestations: insulin dependent. Home regimen includes Lantus. Most recent hemoglobin A1c is 4.4 on 11/24/22.   Glucose elevated at times.  Primary team to manage sliding scale insulin.   LOS: 14 Jeremy [name] 9/10/20242:10 PM

## 2023-07-05 NOTE — Consult Note (Signed)
PHARMACY CONSULT NOTE - ELECTROLYTES  Pharmacy Consult for Electrolyte Monitoring and Replacement   Recent Labs: Height: 5\' 10"  (177.8 cm) Weight: 97.5 kg (214 lb 15.2 oz) IBW/kg (Calculated) : 73 Estimated Creatinine Clearance: 62 mL/min (A) (by C-G formula based on SCr of 1.28 mg/dL (H)). Potassium (mmol/L)  Date Value  07/04/2023 4.1  08/31/2013 3.5   Magnesium (mg/dL)  Date Value  16/07/9603 2.1   Calcium (mg/dL)  Date Value  54/06/8118 8.1 (L)   Calcium, Total (mg/dL)  Date Value  14/78/2956 8.9   Albumin (g/dL)  Date Value  21/30/8657 2.7 (L)  02/15/2022 3.6 (L)  08/31/2013 2.9 (L)   Phosphorus (mg/dL)  Date Value  84/69/6295 3.3  08/28/2013 4.9   Sodium (mmol/L)  Date Value  07/04/2023 142  02/15/2022 138  08/31/2013 143   Assessment  Jeremy [name] is a 72 y.o. male presenting with pneumonia. PMH significant for HTN, HLD, DM, chronic respiratory failure / COPD on 4L O2, diastolic CHF, GIB, depression, MS (wheel-chair bound), pulmonary HTN, chronic pain, liver cirrhosis, obesity, anemia. CT chest revealed features compatible with multifocal pneumonia. Pharmacy has been consulted to monitor and replace electrolytes.  Diet: heart healthy/carb modified - aspiration precautions MIVF: n/a Pertinent medications: lasix 20 mg PO daily.   Goal of Therapy: Electrolytes WNL  Plan:  Low albumin 2ry to liver cirrhosis. Electrolytes WNL on 9/9 and no labs ordered for this morning. Will follow AM labs and replace electrolytes as indicated.  Thank you for allowing pharmacy to be a part of this patient's care.  Kataleyah Carducci Rodriguez-Guzman PharmD, BCPS 07/05/2023 8:02 AM

## 2023-07-05 NOTE — Consult Note (Signed)
   [facility] CM Inpatient Consult   07/05/2023  [name] 04-18-1951 [id number]  Primary Care Provider: Dr. Franky Macho  Patient is currently active with Care Management for chronic disease management services.   Update: Pt discharging home today with [facility] ([facility]).  Liaison will collaborate with the active RN CM concerning pt's discharge.   Of note, Care Management services does not replace or interfere with any services that are needed or arranged by inpatient [facility] care management team.   For additional questions or referrals please contact:    Elliot Cousin, RN, [facility] Liaison [facility]   Population Health Office Hours MTWF  8:00 am-6:00 pm [phone] mobile [phone] [Office toll free line] Office Hours are M-F 8:30 - 5 pm [email or web address]

## 2023-07-05 NOTE — Discharge Summary (Signed)
Physician Discharge Summary   Patient: Jeremy [name] MRN: [id number] DOB: 15-Jun-1951  Admit date:     06/21/2023  Discharge date: 07/05/23  Discharge Physician: Marcelino Duster   PCP: Duanne Limerick, MD   Recommendations at discharge:   PCP follow up in 1 week. Cardiology, nephrology follow up as scheduled.  Discharge Diagnoses: Principal Problem:   Acute on chronic respiratory failure with hypoxia (HCC) Active Problems:   Acute on chronic diastolic CHF (congestive heart failure) (HCC)   COPD exacerbation (HCC)   Moderate pulmonary hypertension (HCC)   Pleural effusion   Essential hypertension   Hyperlipidemia   Multiple sclerosis (HCC)   Myocardial injury   Stage 3a chronic kidney disease (HCC)   Anemia in chronic kidney disease (CKD)   Hyponatremia   Cirrhosis of liver without ascites (HCC)   Urinary retention   Depression   Obesity (BMI 30-39.9)  Resolved Problems:   * No resolved hospital problems. *  Hospital Course: Jeremy [name] is a 72 y.o. male with medical history significant of HTN, HLD, DM, chronic respiratory failure, COPD on 4L O2, dCHF, GIB, depression, MS (wheelchair-bound), pulmonary hypertension, chronic pain, liver cirrhosis, obesity, anemia, who presents with SOB.   Patient was recently hospitalized from 7/12 - 7/26 due to CHF exacerbation and right lobar pneumonia. Patient states that he has worsening shortness of breath in the past several days, which has been progressively worsening.  Patient has cough with clear thick mucus production.  Denies chest pain, fever or chills.  Patient also has worsening bilateral leg edema and 13 pounds of weight gain recently.   Patient is normally using 4L of oxygen, but was found to have severe respiratory distress, using accessory muscle for breathing in ED, oxygen saturation 88% on 5 L oxygen.  Patient was given 1 dose of Lasix to 40 mg, 125 mg of Solu-Medrol and nebulizer treatment in ED. He feels better.   Currently on 8 L of oxygen with 98% of saturation.   Patient had urinary retention with Foley catheter placed recently.  After removal of Foley catheter, patient had bladder scan in PCPs office 2 weeks ago, which showed urinary retention, so Foley catheter was placed again.    ED Course: BNP 465.9, troponin level 21 --> 20, lactic acid of 9.6, WBC 9.1, INR 1.3, Na 120, stable renal function, temperature normal, blood pressure 125/49, heart rate 80s.   VBG with pH 7.42, CO2 46, O2 70.8.   Chest x-ray showed moderate right pleural effusion.    Patient is admitted to the hospitalist service with the impression of acute on chronic hypoxic respiratory failure due to multifocal pneumonia, CHF and COPD exacerbation.  Patient is treated with IV antibiotics, DuoNebs, IV steroids, IV Lasix therapy.  Patient did get Unasyn, doxycycline therapy, had to be moved to stepdown unit on 9/1.  Patient had right sided thoracentesis 06/28/2023 with 400 mL removed.  Patient is seen by nephrologist who recommended IV Lasix infusion, albumin infusions.  Patient's kidney function closely monitored, Lasix transition to oral therapy.  Patient's supplemental oxygen gradually weaned down to his baseline 4 L.  Patient is very weak and debilitated, seen by PT who recommended hospital bed and [facility] lift.  Palliative care team involved who discussed and he agreed for DNR/DNI I discussed with patient and his wife who agreed with palliative care follow-up.  Palliative care discussed with him and his wife advised outpatient follow-up.  Patient refused to go to SNF, home health arranged, hospital bed and  Hoyer lift order sent.  Patient understands to follow-up with PCP, cardiology, nephrology upon discharge as instructed.  Patient and his wife understand and agree with the discharge plan.  Assessment and Plan: Acute on chronic hypoxic respiratory failure most likely due to CHF exacerbation, Multifocal pneumonia  Patient uses 4 L oxygen at  baseline went up to on 8-9 L during this hospital stay.   CT chest: Bilateral lung collapse, multifocal pneumonia, bilateral effusion R >L. Finished 7 days of Unasyn. Finished doxycycline 100 mg IV twice daily for aspiration Total duration of antibiotics 7 days. 9/2 patient's respiratory status got worse on 9/1, moved to stepdown unit with pulmonary critical care evaluation. Encourage incentive spirometry. 9/5 moved out of step down,  Weaned O2 to his baseline 4L, maintains 90-92% saturation.  With mild exertion sats drop but quickly recovers to 91. Continue oral Lasix for diuresis.   Diastolic CHF exacerbation and anasarca with significant lower extremity edema Patient presented with shortness of breath and LE edema S/p Lasix 40 mg IV twice daily transitioned to IV infusion changed to oral 20mg  daily per nephrology. S/p albumin infusions Continue to aim for net negative output. 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  As per patient's wife patient cannot have more procedures done due to respiratory failure and he may not wake up.  Patient and his wife would prefer conservative management.  8/30 Hb 6.9, 1 unit PRBC transfused. Hb around 8 and stable.   Stage 3a chronic kidney disease  Recent baseline 0.90 1.2-1.4.   Creatinine stable on current diuresis Monitor renal function and urine output S/p IV albumin, IV lasix drip as above. Nephrology follow up outpatient advised.   COPD exacerbation, most likely due to volume overload s/p Solu-Medrol 40 mg IV BID, DC'd on 8/29  S/p  doxycycline 100  mg p.o. twice daily d/c'd on 8/30 Mucinex 600 mg p.o. twice daily Continue Robitussin DM as needed. Prescribed nebulizer machine for duonebs PRN.   Pleural effusion Moderate right pleural effusion most likely due to CHF, liver cirrhosis with hypoalbuminemia. Continue diuresis as above 8/30 s/p thoracentesis 1.2 L fluid was tapped by IR, postprocedure x-ray did not show any complications. Fluid studies, as per lights criteria, LDH elevated, consistent with exudative but clinically no signs of infection. Started Abx due to CT scan findings of multifocal pneumonia, bilateral effusion, right is moderate. 9/3 s/p repeat right thoracentesis, 400 mL fluid was removed.   Type 2 diabetes mellitus- Continue Semglee 7 units nightly Accucheks with sliding scale insulin. Continue diabetic diet Hypoglycemia protocol.   Hyperlipidemia Zocor and lopid   Multiple sclerosis (HCC) On Betaseron injection Fall precautions   Myocardial injury:  troponin level 21 --> -20.   No chest pain Continue Zocor   Folic acid deficiency, folate level 5.2 Daily folate supplement.   Hypokalemia-  secondary to diuresis, potassium repleted. Monitor electrolytes daily   Hypomagnesemia- Replete as needed and recheck.   Hypotonic hyponatremia: Resolved He got Sodium chloride 2 g p.o. 3 times daily for 5 days Continue fluid restriction 1200 ml/d Na stable at 144   Cirrhosis of liver without ascites (  HCC): Mental status normal.  INR 1.3 Ammonia level 26 wnl   Chronic urinary retention: Foley removed , he is able to void. He is at high risk of retention. Bladder scan per nursing protocol. Continue Flomax. If persistent retention, replace foley with outpatient urology follow up.                                                                                                                                                                                                                                                                                                                Depression Continue home medication, Zoloft   Vitamin D deficiency: started vitamin D 50,000 units p.o. weekly, follow with PCP to repeat vitamin D level after 3 to 6 months.   Obesity Body mass index is 30.68 kg/m.       Consultants: Palliative, Nephrology Procedures performed: none  Disposition: Home health Diet recommendation:  Discharge Diet Orders (From admission, onward)     Start     Ordered   07/05/23 0000  Diet - low sodium heart healthy        07/05/23 1141           Cardiac and Carb modified diet DISCHARGE MEDICATION: Allergies as of 07/05/2023       Reactions   Betadine [povidone Iodine]         Medication List     TAKE these medications    acetaminophen 500 MG tablet Commonly known as: TYLENOL Take 500 mg by mouth every 6 (six) hours as needed for mild pain.   albuterol 108 (90 Base) MCG/ACT inhaler Commonly known as: VENTOLIN HFA Inhale 2 puffs into the lungs in the morning, at noon, in the evening, and at bedtime.   ascorbic acid 500 MG tablet Commonly known as: VITAMIN C Take 1 tablet (500 mg total) by mouth daily. Start taking on: July 06, 2023   Betaseron 0.3 MG Kit injection Generic drug: Interferon Beta-1b Inject subcutaneously 1 syringe every other day. What changed:  how much  to take how to take this when to take this   cyanocobalamin 500 MCG tablet Commonly known as: VITAMIN B12 Take 1,000 mcg by mouth daily.   fluticasone 50 MCG/ACT nasal spray Commonly known as: FLONASE Place 1 spray into both nostrils daily. Start taking on: July 06, 2023   folic acid 1 MG tablet Commonly known as: FOLVITE Take 1 tablet (1 mg total) by mouth daily. Start taking on: July 06, 2023   furosemide 40 MG tablet Commonly known as: LASIX Take 0.5 tablets (20 mg total) by mouth every other day. 1/2 pill every other day- Singh What changed: how much to take    gemfibrozil 600 MG tablet Commonly known as: LOPID TAKE 1 TABLET BY MOUTH TWICE  DAILY BEFORE MEALS   guaiFENesin 600 MG 12 hr tablet Commonly known as: MUCINEX Take 1 tablet (600 mg total) by mouth 2 (two) times daily.   insulin glargine 100 UNIT/ML Solostar Pen Commonly known as: LANTUS Inject 7 Units into the skin daily. What changed: how much to take   ipratropium-albuterol 0.5-2.5 (3) MG/3ML Soln Commonly known as: DUONEB Take 3 mLs by nebulization every 4 (four) hours as needed.   iron polysaccharides 150 MG capsule Commonly known as: NIFEREX Take 1 capsule (150 mg total) by mouth daily. Start taking on: July 06, 2023   lidocaine 5 % Commonly known as: LIDODERM Place 1 patch onto the skin daily. Remove & Discard patch within 12 hours or as directed by MD   loratadine 10 MG tablet Commonly known as: CLARITIN Take 1 tablet (10 mg total) by mouth daily.   mupirocin ointment 2 % Commonly known as: BACTROBAN Apply 1 Application topically 2 (two) times daily. Apply to effected area bid   nystatin powder Commonly known as: MYCOSTATIN/NYSTOP Apply topically 3 (three) times daily.   omeprazole 40 MG capsule Commonly known as: PRILOSEC Take 1 capsule (40 mg total) by mouth daily.   pregabalin 200 MG capsule Commonly known as: LYRICA Take 1 capsule (200 mg total) by mouth 2 (two) times daily.   promethazine-dextromethorphan 6.25-15 MG/5ML syrup Commonly known as: PROMETHAZINE-DM Take 5 mLs by mouth 4 (four) times daily as needed for cough.   sertraline 50 MG tablet Commonly known as: ZOLOFT TAKE ONE-HALF TABLET BY MOUTH  DAILY   simvastatin 40 MG tablet Commonly known as: ZOCOR Take 1 tablet (40 mg total) by mouth daily.   tamsulosin 0.4 MG Caps capsule Commonly known as: FLOMAX Take 1 capsule (0.4 mg total) by mouth daily after supper.   traMADol 50 MG tablet Commonly known as: ULTRAM Take 1 tablet (50 mg total) by mouth every 6 (six) hours as  needed.   Vitamin D (Ergocalciferol) 1.25 MG (50000 UNIT) Caps capsule Commonly known as: DRISDOL Take 1 capsule (50,000 Units total) by mouth every 7 (seven) days. Start taking on: July 06, 2023               Durable Medical Equipment  (From admission, onward)           Start     Ordered   07/04/23 1153  For home use only DME Hospital bed  Once       Question Answer Comment  Length of Need Lifetime   The above medical condition requires: Patient requires the ability to reposition frequently   Head must be elevated greater than: 45 degrees   Bed type Semi-electric   Hoyer Lift Yes   Support Surface: Alternating Pressure Pad and Pump  07/04/23 1153   07/03/23 1407  For home use only DME Nebulizer machine  Once       Question Answer Comment  Patient needs a nebulizer to treat with the following condition COPD (chronic obstructive pulmonary disease) (HCC)   Length of Need Lifetime      07/03/23 1406            Discharge Exam: Filed Weights   07/03/23 0605 07/04/23 0420 07/05/23 0432  Weight: 95 kg 97.2 kg 97.5 kg   General - Elderly obese Caucasian ill male, mild respiratory distress HEENT - PERRLA, EOMI, atraumatic head, non tender sinuses. Lung -diffuse rales, rhonchi. Heart - S1, S2 heard, no murmurs, rubs, 2+ pedal edema Neuro - Alert, awake and oriented x 3, non focal exam. Skin - Warm and dry.  Condition at discharge: stable  The results of significant diagnostics from this hospitalization (including imaging, microbiology, ancillary and laboratory) are listed below for reference.   Imaging Studies: ECHOCARDIOGRAM COMPLETE  Result Date: 06/28/2023    ECHOCARDIOGRAM REPORT   Patient Name:   Jeremy [name] Date of Exam: 06/28/2023 Medical Rec #:  [id number]     Height:       70.0 in Accession #:    [id number]    Weight:       213.8 lb Date of Birth:  [birth year]     BSA:          2.148 m Patient Age:    72 years      BP:           128/46 mmHg  Patient Gender: M             HR:           72 bpm. Exam Location:  [facility] Procedure: 2D Echo, Color Doppler and Cardiac Doppler Indications:     CHF-acute diastolic I50.31  History:         Patient has prior history of Echocardiogram examinations, most                  recent 05/09/2023. Risk Factors:Diabetes and Hypertension.  Sonographer:     Cristela Blue Referring Phys:  [id number] Gillis Santa Diagnosing Phys: Yvonne Kendall MD IMPRESSIONS  1. Left ventricular ejection fraction, by estimation, is 55 to 60%. The left ventricle has normal function. The left ventricle has no regional wall motion abnormalities. There is mild asymmetric left ventricular hypertrophy of the basal-septal segment. Left ventricular diastolic parameters were normal.  2. Right ventricular systolic function is normal. The right ventricular size is normal. Tricuspid regurgitation signal is inadequate for assessing PA pressure.  3. The mitral valve is normal in structure. Mild mitral valve regurgitation. No evidence of mitral stenosis.  4. The aortic valve has an indeterminant number of cusps. Aortic valve regurgitation is not visualized. No aortic stenosis is present. FINDINGS  Left Ventricle: Left ventricular ejection fraction, by estimation, is 55 to 60%. The left ventricle has normal function. The left ventricle has no regional wall motion abnormalities. The left ventricular internal cavity size was normal in size. There is  mild asymmetric left ventricular hypertrophy of the basal-septal segment. Left ventricular diastolic parameters were normal. Right Ventricle: The right ventricular size is normal. No increase in right ventricular wall thickness. Right ventricular systolic function is normal. Tricuspid regurgitation signal is inadequate for assessing PA pressure. Left Atrium: Left atrial size was normal in size. Right Atrium: Right atrial size was normal in size. Pericardium: The pericardium was not well  visualized. Mitral Valve: The mitral  valve is normal in structure. Mild mitral valve regurgitation. No evidence of mitral valve stenosis. Tricuspid Valve: The tricuspid valve is not well visualized. Tricuspid valve regurgitation is trivial. Aortic Valve: The aortic valve has an indeterminant number of cusps. Aortic valve regurgitation is not visualized. No aortic stenosis is present. Aortic valve mean gradient measures 4.0 mmHg. Aortic valve peak gradient measures 7.3 mmHg. Aortic valve area, by VTI measures 2.26 cm. Pulmonic Valve: The pulmonic valve was not well visualized. Pulmonic valve regurgitation is not visualized. No evidence of pulmonic stenosis. Aorta: The aortic root is normal in size and structure. Pulmonary Artery: The pulmonary artery is not well seen. Venous: The inferior vena cava was not well visualized. IAS/Shunts: The interatrial septum was not well visualized.  LEFT VENTRICLE PLAX 2D LVIDd:         5.20 cm   Diastology LVIDs:         3.60 cm   LV e' medial:    9.79 cm/s LV PW:         0.90 cm   LV E/e' medial:  11.5 LV IVS:        1.34 cm   LV e' lateral:   13.10 cm/s LVOT diam:     2.00 cm   LV E/e' lateral: 8.6 LV SV:         74 LV SV Index:   35 LVOT Area:     3.14 cm  RIGHT VENTRICLE RV Basal diam:  4.00 cm RV Mid diam:    3.30 cm RV S prime:     12.80 cm/s TAPSE (M-mode): 2.4 cm LEFT ATRIUM           Index        RIGHT ATRIUM           Index LA Vol (A2C): 46.2 ml 21.51 ml/m  RA Area:     13.90 cm LA Vol (A4C): 35.8 ml 16.67 ml/m  RA Volume:   34.20 ml  15.93 ml/m  AORTIC VALVE AV Area (Vmax):    2.18 cm AV Area (Vmean):   1.94 cm AV Area (VTI):     2.26 cm AV Vmax:           134.67 cm/s AV Vmean:          92.867 cm/s AV VTI:            0.329 m AV Peak Grad:      7.3 mmHg AV Mean Grad:      4.0 mmHg LVOT Vmax:         93.40 cm/s LVOT Vmean:        57.200 cm/s LVOT VTI:          0.236 m LVOT/AV VTI ratio: 0.72  AORTA Ao Root diam: 3.40 cm MITRAL VALVE MV Area (PHT): 3.77 cm     SHUNTS MV Decel Time: 201 msec      Systemic VTI:  0.24 m MV E velocity: 113.00 cm/s  Systemic Diam: 2.00 cm MV A velocity: 84.40 cm/s MV E/A ratio:  1.34 Cristal Deer End MD Electronically signed by Yvonne Kendall MD Signature Date/Time: 06/28/2023/6:01:25 PM    Final    US THORACENTESIS ASP PLEURAL SPACE W/IMG GUIDE  Result Date: 06/28/2023 INDICATION: 72 year old male. History of positive D-dimer, pneumonia and right-sided pleural effusion. Request is for therapeutic and diagnostic right-sided thoracentesis EXAM: ULTRASOUND GUIDED THERAPEUTIC AND DIAGNOSTIC RIGHT-SIDED THORACENTESIS MEDICATIONS: Lidocaine 1% 10 mL COMPLICATIONS: None immediate. PROCEDURE:  An ultrasound guided thoracentesis was thoroughly discussed with the patient and questions answered. The benefits, risks, alternatives and complications were also discussed. The patient understands and wishes to proceed with the procedure. Written consent was obtained. Ultrasound was performed to localize and mark an adequate pocket of fluid in the right chest. The area was then prepped and draped in the normal sterile fashion. 1% Lidocaine was used for local anesthesia. Under ultrasound guidance a 6 Fr Safe-T-Centesis catheter was introduced. Thoracentesis was performed. The catheter was removed and a dressing applied. FINDINGS: A total of approximately 400 mL of serosanguineous fluid was removed. Samples were sent to the laboratory as requested by the clinical team. Ultrasound showed right-sided pleural effusion to be loculated. IMPRESSION: Successful ultrasound guided right-sided therapeutic and diagnostic thoracentesis yielding 400 mL of pleural fluid. Performed by: Anders Grant, NP Electronically Signed   By: Gilmer Mor D.O.   On: 06/28/2023 13:20   DG Chest Port 1 View  Result Date: 06/28/2023 CLINICAL DATA:  72 year old male with a history thoracentesis EXAM: PORTABLE CHEST 1 VIEW COMPARISON:  06/27/2023 FINDINGS: Cardiomediastinal silhouette unchanged in size and contour.  Similar appearance of mixed interstitial and airspace opacities of the bilateral lungs, with improved aeration at the right lung base. Minimal blunting at the right costophrenic angle, significantly improved from the prior. Obscuration of the left hemidiaphragm persists. No pneumothorax. IMPRESSION: Improved right-sided pleural fluid status post thoracentesis, with no significant residual and no complicating features. Similar appearance of multifocal interstitial and airspace disease of the bilateral lungs, and persisting opacity at the left lung base. Electronically Signed   By: Gilmer Mor D.O.   On: 06/28/2023 13:16   CT Angio Chest Pulmonary Embolism (PE) W or WO Contrast  Result Date: 06/27/2023 CLINICAL DATA:  72 year old male with history of positive D-dimer. Low to intermediate probability of pulmonary embolism. EXAM: CT ANGIOGRAPHY CHEST WITH CONTRAST TECHNIQUE: Multidetector CT imaging of the chest was performed using the standard protocol during bolus administration of intravenous contrast. Multiplanar CT image reconstructions and MIPs were obtained to evaluate the vascular anatomy. RADIATION DOSE REDUCTION: This exam was performed according to the departmental dose-optimization program which includes automated exposure control, adjustment of the mA and/or kV according to patient size and/or use of iterative reconstruction technique. CONTRAST:  75mL OMNIPAQUE IOHEXOL 350 MG/ML SOLN COMPARISON:  Chest CT 06/26/2023. FINDINGS: Cardiovascular: No filling defects within the pulmonary arterial tree to suggest pulmonary embolism. Heart size is mildly enlarged. There is no significant pericardial fluid, thickening or pericardial calcification. There is aortic atherosclerosis, as well as atherosclerosis of the great vessels of the mediastinum and the coronary arteries, including calcified atherosclerotic plaque in the left main, left anterior descending and left circumflex coronary arteries.  Mediastinum/Nodes: No pathologically enlarged mediastinal or hilar lymph nodes. Esophagus is unremarkable in appearance. No axillary lymphadenopathy. Lungs/Pleura: 13 mm pleural-based nodule in the periphery of the right upper lobe (axial image 54 of series 5), similar on prior exams dating back to 10/07/2021, previously not hypermetabolic on prior PET-CT 11/30/2021, likely benign. Several patchy areas of ground-glass attenuation are again noted in the lungs bilaterally, randomly distributed, likely of infectious or inflammatory etiology. In addition, there is complete atelectasis of the right lower lobe, as well as some passive subsegmental atelectasis in the lateral segment of the right middle lobe and dependent portions of the left lower lobe. Moderate to large right and small left pleural effusions lie dependently. Upper Abdomen: Liver has a shrunken appearance and nodular contour, indicative of  underlying cirrhosis. Small to moderate volume of ascites in the upper abdomen. Musculoskeletal: There are no aggressive appearing lytic or blastic lesions noted in the visualized portions of the skeleton. Review of the MIP images confirms the above findings. IMPRESSION: 1. No evidence of pulmonary embolism. 2. Moderate to large right and small left pleural effusions persist with associated areas of atelectasis, as above. 3. Patchy areas of ground-glass attenuation scattered throughout the lungs bilaterally, presumably of infectious or inflammatory etiology. 4. Stable right upper lobe pulmonary nodule dating back to 10/07/2021, previously not hypermetabolic, considered benign. 5. Cirrhosis. 6. Ascites. 7. Aortic atherosclerosis, in addition to left main and 2 vessel coronary artery disease. Please note that although the presence of coronary artery calcium documents the presence of coronary artery disease, the severity of this disease and any potential stenosis cannot be assessed on this non-gated CT examination.  Assessment for potential risk factor modification, dietary therapy or pharmacologic therapy may be warranted, if clinically indicated. Aortic Atherosclerosis (ICD10-I70.0). Electronically Signed   By: Trudie Reed M.D.   On: 06/27/2023 05:56   DG Chest Port 1 View  Result Date: 06/27/2023 CLINICAL DATA:  Chest EXAM: PORTABLE CHEST 1 VIEW COMPARISON:  06/24/2023 FINDINGS: Heart and mediastinal contours within normal limits. Worsening bilateral airspace disease, right greater than left. Moderate right pleural effusion. Small left pleural effusion. No acute bony abnormality. IMPRESSION: Bilateral pleural effusions, right greater than left with worsening bilateral airspace disease, also greater than right. This could reflect asymmetric edema or infection. Electronically Signed   By: Charlett Nose M.D.   On: 06/27/2023 03:06   CT CHEST WO CONTRAST  Result Date: 06/26/2023 CLINICAL DATA:  Pneumonia. EXAM: CT CHEST WITHOUT CONTRAST TECHNIQUE: Multidetector CT imaging of the chest was performed following the standard protocol without IV contrast. RADIATION DOSE REDUCTION: This exam was performed according to the departmental dose-optimization program which includes automated exposure control, adjustment of the mA and/or kV according to patient size and/or use of iterative reconstruction technique. COMPARISON:  08/18/2022 FINDINGS: Cardiovascular: Heart is mildly enlarged. No substantial pericardial effusion. Coronary artery calcification is evident. Mild atherosclerotic calcification is noted in the wall of the thoracic aorta. Enlargement of the pulmonary outflow tract/main pulmonary arteries suggests pulmonary arterial hypertension. Mediastinum/Nodes: Scattered upper normal mediastinal lymph nodes evident. No evidence for gross hilar lymphadenopathy although assessment is limited by the lack of intravenous contrast on the current study. Mild circumferential wall thickening noted distal esophagus. There is no  axillary lymphadenopathy. Lungs/Pleura: Peripheral right upper lobe pulmonary nodule measured previously at 1.4 x 1.3 cm is now 1.5 x 1.2 cm on image 62/4. Bilateral lower lobe collapse/consolidation is new in the interval with patchy areas of ground-glass opacity in the left upper lobe. New moderate right and tiny left pleural effusions. Sub pulmonic component to the left pleural effusion. Upper Abdomen: Nodular liver contour is compatible with cirrhosis. Musculoskeletal: No worrisome lytic or sclerotic osseous abnormality. IMPRESSION: 1. Interval development of bilateral lower lobe collapse/consolidation with patchy areas of ground-glass opacity in the left upper lobe. Imaging features compatible with multifocal pneumonia. 2. New moderate right and tiny left pleural effusions. 3. Similar peripheral right upper lobe pulmonary nodule. This lesion showed no appreciable hypermetabolism on PET-CT of 11/30/2021. 4. Cirrhosis. 5.  Aortic Atherosclerosis (ICD10-I70.0). Electronically Signed   By: Kennith Center M.D.   On: 06/26/2023 10:21   US THORACENTESIS ASP PLEURAL SPACE W/IMG GUIDE  Result Date: 06/24/2023 INDICATION: Right pleural effusion. Request received for diagnostic and therapeutic thoracentesis. EXAM:  ULTRASOUND GUIDED RIGHT THORACENTESIS MEDICATIONS: 8 cc 1% lidocaine COMPLICATIONS: None immediate. PROCEDURE: An ultrasound guided thoracentesis was thoroughly discussed with the patient and questions answered. The benefits, risks, alternatives and complications were also discussed. The patient understands and wishes to proceed with the procedure. Written consent was obtained. Ultrasound was performed to localize and mark an adequate pocket of fluid in the RIGHT chest. The area was then prepped and draped in the normal sterile fashion. 1% Lidocaine was used for local anesthesia. Under ultrasound guidance a 6 Fr Safe-T-Centesis catheter was introduced. Thoracentesis was performed. The catheter was removed and  a dressing applied. FINDINGS: A total of approximately 1.2 L of serosanguineous fluid was removed. Samples were sent to the laboratory as requested by the clinical team. IMPRESSION: Successful ultrasound guided diagnostic RIGHT thoracentesis yielding 1.2 L of pleural fluid. Follow-up chest x-ray revealed no evidence of pneumothorax. Procedure performed by Mina Marble, PA-C Electronically Signed   By: Roanna Banning M.D.   On: 06/24/2023 12:57   DG Chest Port 1 View  Result Date: 06/24/2023 CLINICAL DATA:  Status post thoracentesis for right pleural effusion. EXAM: PORTABLE CHEST 1 VIEW COMPARISON:  06/21/2023 FINDINGS: Stable cardiomediastinal contours. The right pleural effusion has decreased in volume from the previous exam. No pneumothorax identified. Diffuse increase interstitial markings are identified bilaterally, similar to previous exam. Nodular opacity within the right upper lobe is again noted measuring 1.3 cm. This is been evaluated with prior PET-CT showing no significant uptake favoring a benign nodule. IMPRESSION: 1. Interval decrease in volume of right pleural effusion. No pneumothorax identified. 2. Increase interstitial markings favored to represent pulmonary vascular congestion/mild edema. Electronically Signed   By: Signa Kell M.D.   On: 06/24/2023 12:01   DG Chest 2 View  Result Date: 06/21/2023 CLINICAL DATA:  Shortness of breath.  COPD.  Suspected sepsis. EXAM: CHEST - 2 VIEW COMPARISON:  05/27/2023. FINDINGS: Redemonstration of moderate right layering pleural effusion with associated probable atelectatic changes in the right lung lower lobe. Correlate clinically to exclude superimposed pneumonia. No significant interval change. There is mild pulmonary vascular congestion. Left lung and left lateral costophrenic angle are clear. Stable cardio-mediastinal silhouette. No acute osseous abnormalities. The soft tissues are within normal limits. IMPRESSION: Moderate right pleural effusion  with associated probable atelectatic changes in the right lung lower lobe. Correlate clinically to exclude superimposed pneumonia. No significant interval change. Electronically Signed   By: Jules Schick M.D.   On: 06/21/2023 16:21    Microbiology: Results for orders placed or performed during the hospital encounter of 06/21/23  Culture, blood (Routine x 2)     Status: None   Collection Time: 06/21/23  3:02 PM   Specimen: BLOOD  Result Value Ref Range Status   Specimen Description BLOOD LEFT ANTECUBITAL  Final   Special Requests   Final    BOTTLES DRAWN AEROBIC AND ANAEROBIC Blood Culture adequate volume   Culture   Final    NO GROWTH 5 DAYS Performed at [facility], [address], [place], [place] [address]    Report Status 06/26/2023 FINAL  Final  Culture, blood (Routine x 2)     Status: None   Collection Time: 06/21/23  3:13 PM   Specimen: BLOOD  Result Value Ref Range Status   Specimen Description BLOOD RIGHT ANTECUBITAL  Final   Special Requests   Final    BOTTLES DRAWN AEROBIC AND ANAEROBIC Blood Culture adequate volume   Culture   Final    NO  GROWTH 5 DAYS Performed at [facility], [address] [place], [place] [address]    Report Status 06/26/2023 FINAL  Final  Resp panel by RT-PCR (RSV, Flu A&B, Covid) Anterior Nasal Swab     Status: None   Collection Time: 06/21/23  4:35 PM   Specimen: Anterior Nasal Swab  Result Value Ref Range Status   SARS Coronavirus 2 by RT PCR NEGATIVE NEGATIVE Final    Comment: (NOTE) SARS-CoV-2 target nucleic acids are NOT DETECTED.  The SARS-CoV-2 RNA is generally detectable in upper respiratory specimens during the acute phase of infection. The lowest concentration of SARS-CoV-2 viral copies this assay can detect is 138 copies/mL. A negative result does not preclude SARS-Cov-2 infection and should not be used as the sole basis for treatment or other patient management decisions. A negative result may occur with   improper specimen collection/handling, submission of specimen other than nasopharyngeal swab, presence of viral mutation(s) within the areas targeted by this assay, and inadequate number of viral copies(<138 copies/mL). A negative result must be combined with clinical observations, patient history, and epidemiological information. The expected result is Negative.  Fact Sheet for Patients:  BloggerCourse.com  Fact Sheet for Healthcare Providers:  SeriousBroker.it  This test is no t yet approved or cleared by the [place] FDA and  has been authorized for detection and/or diagnosis of SARS-CoV-2 by FDA under an Emergency Use Authorization (EUA). This EUA will remain  in effect (meaning this test can be used) for the duration of the COVID-19 declaration under Section 564(b)(1) of the Act, 21 U.S.C.section 360bbb-3(b)(1), unless the authorization is terminated  or revoked sooner.       Influenza A by PCR NEGATIVE NEGATIVE Final   Influenza B by PCR NEGATIVE NEGATIVE Final    Comment: (NOTE) The Xpert Xpress SARS-CoV-2/FLU/RSV plus assay is intended as an aid in the diagnosis of influenza from Nasopharyngeal swab specimens and should not be used as a sole basis for treatment. Nasal washings and aspirates are unacceptable for Xpert Xpress SARS-CoV-2/FLU/RSV testing.  Fact Sheet for Patients: BloggerCourse.com  Fact Sheet for Healthcare Providers: SeriousBroker.it  This test is not yet approved or cleared by the [place] FDA and has been authorized for detection and/or diagnosis of SARS-CoV-2 by FDA under an Emergency Use Authorization (EUA). This EUA will remain in effect (meaning this test can be used) for the duration of the COVID-19 declaration under Section 564(b)(1) of the Act, 21 U.S.C. section 360bbb-3(b)(1), unless the authorization is terminated or revoked.      Resp Syncytial Virus by PCR NEGATIVE NEGATIVE Final    Comment: (NOTE) Fact Sheet for Patients: BloggerCourse.com  Fact Sheet for Healthcare Providers: SeriousBroker.it  This test is not yet approved or cleared by the [place] FDA and has been authorized for detection and/or diagnosis of SARS-CoV-2 by FDA under an Emergency Use Authorization (EUA). This EUA will remain in effect (meaning this test can be used) for the duration of the COVID-19 declaration under Section 564(b)(1) of the Act, 21 U.S.C. section 360bbb-3(b)(1), unless the authorization is terminated or revoked.  Performed at [facility], [address] [place], [place] [address]   MRSA Next Gen by PCR, Nasal     Status: Abnormal   Collection Time: 06/21/23  8:14 PM   Specimen: Nasal Mucosa; Nasal Swab  Result Value Ref Range Status   MRSA by PCR Next Gen DETECTED (A) NOT DETECTED Final    Comment: RESULT CALLED TO, READ BACK BY  AND VERIFIED WITH: KRISTY SNEEAD AT 2206 ON 06/21/23 BY SS (NOTE) The GeneXpert MRSA Assay (FDA approved for NASAL specimens only), is one component of a comprehensive MRSA colonization surveillance program. It is not intended to diagnose MRSA infection nor to guide or monitor treatment for MRSA infections. Test performance is not FDA approved in patients less than 48 years old. Performed at [facility], [address] [place], [place] [address]   Respiratory (~20 pathogens) panel by PCR     Status: None   Collection Time: 06/22/23  3:22 PM   Specimen: Nasopharyngeal Swab; Respiratory  Result Value Ref Range Status   Adenovirus NOT DETECTED NOT DETECTED Final   Coronavirus 229E NOT DETECTED NOT DETECTED Final    Comment: (NOTE) The Coronavirus on the Respiratory Panel, DOES NOT test for the novel  Coronavirus (2019 nCoV)    Coronavirus HKU1 NOT DETECTED NOT DETECTED Final   Coronavirus NL63 NOT DETECTED NOT  DETECTED Final   Coronavirus OC43 NOT DETECTED NOT DETECTED Final   Metapneumovirus NOT DETECTED NOT DETECTED Final   Rhinovirus / Enterovirus NOT DETECTED NOT DETECTED Final   Influenza A NOT DETECTED NOT DETECTED Final   Influenza B NOT DETECTED NOT DETECTED Final   Parainfluenza Virus 1 NOT DETECTED NOT DETECTED Final   Parainfluenza Virus 2 NOT DETECTED NOT DETECTED Final   Parainfluenza Virus 3 NOT DETECTED NOT DETECTED Final   Parainfluenza Virus 4 NOT DETECTED NOT DETECTED Final   Respiratory Syncytial Virus NOT DETECTED NOT DETECTED Final   Bordetella pertussis NOT DETECTED NOT DETECTED Final   Bordetella Parapertussis NOT DETECTED NOT DETECTED Final   Chlamydophila pneumoniae NOT DETECTED NOT DETECTED Final   Mycoplasma pneumoniae NOT DETECTED NOT DETECTED Final    Comment: Performed at [facility], [address], [place], [place] [address]  Body fluid culture w Gram Stain     Status: None   Collection Time: 06/24/23 12:12 PM   Specimen: PATH Cytology Peritoneal fluid  Result Value Ref Range Status   Specimen Description   Final    PERITONEAL Performed at [facility], [address], [place], [place] [address]    Special Requests   Final    NONE Performed at [facility], [address] [place], [place] [address]    Gram Stain NO WBC SEEN NO ORGANISMS SEEN   Final   Culture   Final    NO GROWTH 3 DAYS Performed at [facility], [address], [place], [place] [address]    Report Status 06/27/2023 FINAL  Final  MRSA Next Gen by PCR, Nasal     Status: Abnormal   Collection Time: 06/26/23  5:43 PM   Specimen: Nasal Mucosa; Nasal Swab  Result Value Ref Range Status   MRSA by PCR Next Gen DETECTED (A) NOT DETECTED Final    Comment: RESULT CALLED TO, READ BACK BY AND VERIFIED WITH: [facility] WHITE 06/26/2023 2055 CP (NOTE) The GeneXpert MRSA Assay (FDA approved for NASAL specimens only), is one component of a comprehensive MRSA  colonization surveillance program. It is not intended to diagnose MRSA infection nor to guide or monitor treatment for MRSA infections. Test performance is not FDA approved in patients less than 6 years old. Performed at [facility], [address] [place], [place] [address]   Body fluid culture w Gram Stain     Status: None   Collection Time: 06/28/23 12:25 PM   Specimen: PATH Cytology Pleural fluid  Result Value Ref Range Status   Specimen Description   Final    PLEURAL Performed at [facility], [address], [place], [place] [address]    Special Requests   Final    NONE Performed at [facility], [address] [place], [place] [address]    Gram Stain NO WBC SEEN NO ORGANISMS SEEN   Final   Culture   Final    NO GROWTH 3 DAYS Performed at [facility], [address] [place], [place] [address]    Report Status 07/02/2023 FINAL  Final    Labs: CBC: Recent Labs  Lab 06/28/23 1603 06/29/23 0335 06/30/23 0600 07/01/23 0306 07/02/23 0709  WBC  --  5.7 5.1 5.5 5.8  HGB 9.6* 8.4* 8.4* 8.8* 8.8*  HCT 30.5* 27.1* 26.6* 28.5* 28.1*  MCV  --  88.3 86.4 88.8 87.8  PLT  --  232 204 221 198   Basic Metabolic Panel: Recent Labs  Lab 06/29/23 0335 06/30/23 0600 07/01/23 0306 07/02/23 0709 07/04/23 0312  NA 144 142 144 143 142  K 3.4* 3.6 4.0 3.2* 4.1  CL 103 102 104 105 107  CO2 29 29 30 28 29   GLUCOSE 85 91 104* 78 98  BUN 39* 40* 45* 35* 41*  CREATININE 1.33* 1.23 1.41* 1.26* 1.28*  CALCIUM 7.9* 7.9* 8.1* 7.9* 8.1*  MG 1.6* 2.1  --   --   --   PHOS 3.2 3.3  --   --   --    Liver Function Tests: No results for input(s): "AST", "ALT", "ALKPHOS", "BILITOT", "PROT", "ALBUMIN" in the last 168 hours. CBG: Recent Labs  Lab 07/04/23 1249 07/04/23 1529 07/04/23 2050 07/05/23 0725 07/05/23 1203  GLUCAP 119* 132* 204* 82 111*    Discharge time spent: 42 minutes.  Signed: Marcelino Duster, MD [facility] 07/05/2023

## 2023-07-05 NOTE — Plan of Care (Signed)
  Problem: Education: Goal: Ability to describe self-care measures that may prevent or decrease complications (Diabetes Survival Skills Education) will improve Outcome: Progressing   Problem: Coping: Goal: Ability to adjust to condition or change in health will improve Outcome: Progressing   Problem: Fluid Volume: Goal: Ability to maintain a balanced intake and output will improve Outcome: Progressing   Problem: Cardiac: Goal: Ability to achieve and maintain adequate cardiopulmonary perfusion will improve Outcome: Progressing   Problem: Respiratory: Goal: Ability to maintain a clear airway will improve Outcome: Progressing   Problem: Respiratory: Goal: Levels of oxygenation will improve Outcome: Progressing   Problem: Respiratory: Goal: Ability to maintain adequate ventilation will improve Outcome: Progressing   Problem: Clinical Measurements: Goal: Ability to maintain a body temperature in the normal range will improve Outcome: Progressing

## 2023-07-05 NOTE — TOC Progression Note (Signed)
Transition of Care [facility]) - Progression Note    Patient Details  Name: Jeremy [name] MRN: [id number] Date of Birth: [birth year]  [facility] CM/SW Contact  Truddie Hidden, RN Phone Number: 07/05/2023, 1:53 PM  Clinical Narrative:    Spoke with patient's wife, Katheyregarding discharge for today.  She confirmed the hospital and hoyer lift have been delivered. She is presently at home awaiting for patient's arrival. She has been advised no time from EMS has been provided.      Barriers to Discharge: Continued Medical Work up  Expected Discharge Plan and Services     Post Acute Care Choice: Home Health, Skilled Nursing Facility Living arrangements for the past 2 months: Single Family Home Expected Discharge Date: 07/05/23                                     Social Determinants of Health (SDOH) Interventions SDOH Screenings   Food Insecurity: No Food Insecurity (06/21/2023)  Housing: Low Risk  (06/21/2023)  Transportation Needs: No Transportation Needs (06/21/2023)  Utilities: Not At Risk (06/21/2023)  Alcohol Screen: Low Risk  (11/16/2021)  Depression (PHQ2-9): Low Risk  (05/27/2023)  Financial Resource Strain: Low Risk  (11/19/2022)  Physical Activity: Insufficiently Active (11/19/2022)  Social Connections: Moderately Isolated (11/19/2022)  Stress: No Stress Concern Present (11/19/2022)  Tobacco Use: Medium Risk (06/21/2023)    Readmission Risk Interventions    11/25/2022   10:25 AM 02/04/2022    9:47 AM  Readmission Risk Prevention Plan  Transportation Screening Complete Complete  PCP or Specialist Appt within 3-5 Days Complete Complete  HRI or Home Care Consult Complete   Social Work Consult for Recovery Care Planning/Counseling Complete Complete  Palliative Care Screening Not Applicable Not Applicable  Medication Review Oceanographer) Complete Complete

## 2023-07-06 ENCOUNTER — Telehealth: Payer: Self-pay | Admitting: Family Medicine

## 2023-07-06 NOTE — Telephone Encounter (Signed)
Copied from CRM [id number]. Topic: General - Other >> Jul 06, 2023  2:16 PM Everette C wrote: Reason for CRM: The patient has been directed to contact their PCP with a request for a prescription for a nebulizer machine   Please contact the patient's wife further when possible

## 2023-07-06 NOTE — Telephone Encounter (Signed)
Change of pharmacy  Requested Prescriptions  Pending Prescriptions Disp Refills   gemfibrozil (LOPID) 600 MG tablet [Pharmacy Med Name: Gemfibrozil 600 MG Oral Tablet] 180 tablet     Sig: TAKE 1 TABLET BY MOUTH TWICE  DAILY BEFORE MEALS     Cardiovascular:  Antilipid - Fibric Acid Derivatives Failed - 07/04/2023 10:40 PM      Failed - Cr in normal range and within 360 days    Creatinine  Date Value Ref Range Status  02/09/2023 1.17 0.61 - 1.24 mg/dL Final  78/29/5621 3.08 (H) 0.60 - 1.30 mg/dL Final   Creatinine, Ser  Date Value Ref Range Status  07/04/2023 1.28 (H) 0.61 - 1.24 mg/dL Final         Failed - HGB in normal range and within 360 days    Hemoglobin  Date Value Ref Range Status  07/02/2023 8.8 (L) 13.0 - 17.0 g/dL Final  65/78/4696 8.7 (L) 13.0 - 17.0 g/dL Final  29/52/8413 9.6 (L) 13.0 - 17.7 g/dL Final         Failed - HCT in normal range and within 360 days    HCT  Date Value Ref Range Status  07/02/2023 28.1 (L) 39.0 - 52.0 % Final   Hematocrit  Date Value Ref Range Status  02/15/2022 29.3 (L) 37.5 - 51.0 % Final         Failed - Lipid Panel in normal range within the last 12 months    Cholesterol, Total  Date Value Ref Range Status  12/25/2020 109 100 - 199 mg/dL Final   Cholesterol  Date Value Ref Range Status  07/10/2021 103 0 - 200 mg/dL Final   LDL Chol Calc (NIH)  Date Value Ref Range Status  12/25/2020 50 0 - 99 mg/dL Final   LDL Cholesterol  Date Value Ref Range Status  07/10/2021 35 0 - 99 mg/dL Final    Comment:           Total Cholesterol/HDL:CHD Risk Coronary Heart Disease Risk Table                     Men   Women  1/2 Average Risk   3.4   3.3  Average Risk       5.0   4.4  2 X Average Risk   9.6   7.1  3 X Average Risk  23.4   11.0        Use the calculated Patient Ratio above and the CHD Risk Table to determine the patient's CHD Risk.        ATP III CLASSIFICATION (LDL):  <100     mg/dL   Optimal  244-010  mg/dL   Near  or Above                    Optimal  130-159  mg/dL   Borderline  272-536  mg/dL   High  >644     mg/dL   Very High Performed at [facility], [address], [place], [place] [address]    HDL  Date Value Ref Range Status  07/10/2021 22 (L) >40 mg/dL Final  25/95/6387 26 (L) >39 mg/dL Final   Triglycerides  Date Value Ref Range Status  07/10/2021 228 (H) <150 mg/dL Final         Passed - ALT in normal range and within 360 days    ALT  Date Value Ref Range Status  06/27/2023 13 0 -  44 U/L Final   SGPT (ALT)  Date Value Ref Range Status  08/31/2013 15 12 - 78 U/L Final         Passed - AST in normal range and within 360 days    AST  Date Value Ref Range Status  06/27/2023 21 15 - 41 U/L Final   SGOT(AST)  Date Value Ref Range Status  08/31/2013 35 15 - 37 Unit/L Final         Passed - PLT in normal range and within 360 days    Platelets  Date Value Ref Range Status  07/02/2023 198 150 - 400 K/uL Final  02/15/2022 261 150 - 450 x10E3/uL Final   Platelet Count  Date Value Ref Range Status  06/20/2023 286 150 - 400 K/uL Final         Passed - WBC in normal range and within 360 days    WBC  Date Value Ref Range Status  07/02/2023 5.8 4.0 - 10.5 K/uL Final         Passed - eGFR is 30 or above and within 360 days    EGFR (African American)  Date Value Ref Range Status  08/31/2013 56 (L)  Final   GFR calc Af Amer  Date Value Ref Range Status  01/15/2020 61 >59 mL/min/1.73 Final   EGFR (Non-African Amer.)  Date Value Ref Range Status  08/31/2013 48 (L)  Final    Comment:    eGFR values <16mL/min/1.73 m2 may be an indication of chronic kidney disease (CKD). Calculated eGFR is useful in patients with stable renal function. The eGFR calculation will not be reliable in acutely ill patients when serum creatinine is changing rapidly. It is not useful in  patients on dialysis. The eGFR calculation may not be applicable to patients at the low and high  extremes of body sizes, pregnant women, and vegetarians.    GFR, Estimated  Date Value Ref Range Status  07/04/2023 60 (L) >60 mL/min Final    Comment:    (NOTE) Calculated using the CKD-EPI Creatinine Equation (2021)   02/09/2023 >60 >60 mL/min Final    Comment:    (NOTE) Calculated using the CKD-EPI Creatinine Equation (2021)    eGFR  Date Value Ref Range Status  02/15/2022 53 (L) >59 mL/min/1.73 Final         Passed - Valid encounter within last 12 months    Recent Outpatient Visits           2 weeks ago Shortness of breath   [place] [facility]   1 month ago Pneumonia of right lower lobe due to infectious organism   [facility] at [facility]   6 months ago Mixed hyperlipidemia   [place] [facility]   6 months ago Gastrointestinal hemorrhage, unspecified gastrointestinal hemorrhage type   [facility] at [facility]   7 months ago Acute cough   [place] [facility]       Future Appointments             In 5 days Duanne Limerick, MD [facility] at [facility], [facility]  omeprazole (PRILOSEC) 40 MG capsule [Pharmacy Med Name: Omeprazole 40 MG Oral Capsule Delayed Release] 90 capsule     Sig: TAKE 1 CAPSULE BY MOUTH DAILY     Gastroenterology: Proton Pump Inhibitors Passed - 07/04/2023 10:40 PM      Passed - Valid encounter within last 12 months    Recent Outpatient Visits           2 weeks ago Shortness of breath   [place] [facility]   1 month ago Pneumonia of right lower lobe due to infectious organism   [facility] at  [facility]   6 months ago Mixed hyperlipidemia   [place] [facility]   6 months ago Gastrointestinal hemorrhage, unspecified gastrointestinal hemorrhage type   [place] [facility]   7 months ago Acute cough   [place] [facility]       Future Appointments             In 5 days Duanne Limerick, MD [facility] Care & [facility] Medicine at [facility]             sertraline (ZOLOFT) 50 MG tablet [Pharmacy Med Name: Sertraline HCl 50 MG Oral Tablet] 45 tablet 0    Sig: TAKE ONE-HALF TABLET BY MOUTH  DAILY     Psychiatry:  Antidepressants - SSRI - sertraline Passed - 07/04/2023 10:40 PM      Passed - AST in normal range and within 360 days    AST  Date Value Ref Range Status  06/27/2023 21 15 - 41 U/L Final   SGOT(AST)  Date Value Ref Range Status  08/31/2013 35 15 - 37 Unit/L Final         Passed - ALT in normal range and within 360 days    ALT  Date Value Ref Range Status  06/27/2023 13 0 - 44 U/L Final   SGPT (ALT)  Date Value Ref Range Status  08/31/2013 15 12 - 78 U/L Final         Passed - Completed PHQ-2 or PHQ-9 in the last 360 days      Passed - Valid encounter within last 6 months    Recent Outpatient Visits           2 weeks ago Shortness of breath   [place] [facility]   1 month ago Pneumonia of right lower lobe due to infectious organism   [facility]   6 months ago Mixed hyperlipidemia   [place] [facility]   6 months ago Gastrointestinal hemorrhage, unspecified gastrointestinal hemorrhage type   [facility]  Med[facility] at [facility]   7 months ago Acute cough    [facility] Medicine at [facility]       Future Appointments             In 5 days Duanne Limerick, MD [facility] Primary Care &  Sports Medicine at [employer], [facility]

## 2023-07-06 NOTE — Telephone Encounter (Signed)
Requested medication (s) are due for refill today: Gemfibrozil: yes   omeprazole: 05/20/23 #90   Requested medication (s) are on the active medication list: yes  Last refill:  Gemfibrozil: 05/03/23 #180         omeprazole: 05/20/23 #90   Future visit scheduled: yes  Notes to clinic:  gemfibrozil: overdue lab work       Omeprazole: last prescribed [facility] Dr [facility]ena [facility]   Requested Prescriptions  Pending Prescriptions Disp Refills   gemfibrozil (LOPID) 600 MG tablet [Pharmacy Med Name: Gemfibrozil 600 MG Oral Tablet] 180 tablet     Sig: TAKE 1 TABLET BY MOUTH TWICE  DAILY BEFORE MEALS     Cardiovascular:  Antilipid - Fibric Acid Derivatives Failed - 07/04/2023 10:40 PM      Failed - Cr in normal range and within 360 days    Creatinine  Date Value Ref Range Status  02/09/2023 1.17 0.61 - 1.24 mg/dL Final  16/07/9603 5.40 (H) 0.60 - 1.30 mg/dL Final   Creatinine, Ser  Date Value Ref Range Status  07/04/2023 1.28 (H) 0.61 - 1.24 mg/dL Final         Failed - HGB in normal range and within 360 days    Hemoglobin  Date Value Ref Range Status  07/02/2023 8.8 (L) 13.0 - 17.0 g/dL Final  98/08/9146 8.7 (L) 13.0 - 17.0 g/dL Final  82/95/6213 9.6 (L) 13.0 - 17.7 g/dL Final         Failed - HCT in normal range and within 360 days    HCT  Date Value Ref Range Status  07/02/2023 28.1 (L) 39.0 - 52.0 % Final   Hematocrit  Date Value Ref Range Status  02/15/2022 29.3 (L) 37.5 - 51.0 % Final         Failed - Lipid Panel in normal range within the last 12 months    Cholesterol, Total  Date Value Ref Range Status  12/25/2020 109 100 - 199 mg/dL Final   Cholesterol  Date Value Ref Range Status  07/10/2021 103 0 - 200 mg/dL Final   LDL Chol Calc (NIH)  Date Value Ref Range Status  12/25/2020 50 0 - 99 mg/dL Final   LDL Cholesterol  Date Value Ref Range Status  07/10/2021 35 0 - 99 mg/dL Final    Comment:           Total Cholesterol/HDL:CHD Risk Coronary Heart Disease Risk  Table                     Men   Women  1/2 Average Risk   3.4   3.3  Average Risk       5.0   4.4  2 X Average Risk   9.6   7.1  3 X Average Risk  23.4   11.0        Use the calculated Patient Ratio above and the CHD Risk Table to determine the patient's CHD Risk.        ATP III CLASSIFICATION (LDL):  <100     mg/dL   Optimal  086-578  mg/dL   Near or Above                    Optimal  130-159  mg/dL   Borderline  469-629  mg/dL   High  >528     mg/dL   Very High Performed at [facility], [address], [place], [place] [address]  HDL  Date Value Ref Range Status  07/10/2021 22 (L) >40 mg/dL Final  55/73/2202 26 (L) >39 mg/dL Final   Triglycerides  Date Value Ref Range Status  07/10/2021 228 (H) <150 mg/dL Final         Passed - ALT in normal range and within 360 days    ALT  Date Value Ref Range Status  06/27/2023 13 0 - 44 U/L Final   SGPT (ALT)  Date Value Ref Range Status  08/31/2013 15 12 - 78 U/L Final         Passed - AST in normal range and within 360 days    AST  Date Value Ref Range Status  06/27/2023 21 15 - 41 U/L Final   SGOT(AST)  Date Value Ref Range Status  08/31/2013 35 15 - 37 Unit/L Final         Passed - PLT in normal range and within 360 days    Platelets  Date Value Ref Range Status  07/02/2023 198 150 - 400 K/uL Final  02/15/2022 261 150 - 450 x10E3/uL Final   Platelet Count  Date Value Ref Range Status  06/20/2023 286 150 - 400 K/uL Final         Passed - WBC in normal range and within 360 days    WBC  Date Value Ref Range Status  07/02/2023 5.8 4.0 - 10.5 K/uL Final         Passed - eGFR is 30 or above and within 360 days    EGFR (African American)  Date Value Ref Range Status  08/31/2013 56 (L)  Final   GFR calc Af Amer  Date Value Ref Range Status  01/15/2020 61 >59 mL/min/1.73 Final   EGFR (Non-African Amer.)  Date Value Ref Range Status  08/31/2013 48 (L)  Final    Comment:    eGFR values  <40mL/min/1.73 m2 may be an indication of chronic kidney disease (CKD). Calculated eGFR is useful in patients with stable renal function. The eGFR calculation will not be reliable in acutely ill patients when serum creatinine is changing rapidly. It is not useful in  patients on dialysis. The eGFR calculation may not be applicable to patients at the low and high extremes of body sizes, pregnant women, and vegetarians.    GFR, Estimated  Date Value Ref Range Status  07/04/2023 60 (L) >60 mL/min Final    Comment:    (NOTE) Calculated using the CKD-EPI Creatinine Equation (2021)   02/09/2023 >60 >60 mL/min Final    Comment:    (NOTE) Calculated using the CKD-EPI Creatinine Equation (2021)    eGFR  Date Value Ref Range Status  02/15/2022 53 (L) >59 mL/min/1.73 Final         Passed - Valid encounter within last 12 months    Recent Outpatient Visits           2 weeks ago Shortness of breath   [place] [facility]   1 month ago Pneumonia of right lower lobe due to infectious organism   [facility]   6 months ago Mixed hyperlipidemia   [place] [facility]   6 months ago Gastrointestinal hemorrhage, unspecified gastrointestinal hemorrhage type   [facility] at [facility]  7 months ago Acute cough   [place] [facility]       Future Appointments             In 5 days Duanne Limerick, MD [facility] at [facility], [facility]             omeprazole (PRILOSEC) 40 MG capsule [Pharmacy Med Name: Omeprazole 40 MG Oral Capsule Delayed Release] 90 capsule     Sig: TAKE 1 CAPSULE BY MOUTH DAILY     Gastroenterology: Proton Pump Inhibitors  Passed - 07/04/2023 10:40 PM      Passed - Valid encounter within last 12 months    Recent Outpatient Visits           2 weeks ago Shortness of breath   [place] [facility]   1 month ago Pneumonia of right lower lobe due to infectious organism   [facility] at [facility]   6 months ago Mixed hyperlipidemia   [place] [facility]   6 months ago Gastrointestinal hemorrhage, unspecified gastrointestinal hemorrhage type   [place] [facility]   7 months ago Acute cough   [place] [facility]       Future Appointments             In 5 days Duanne Limerick, MD [facility] Care & [facility] Medicine at [facility], [facility]            Signed Prescriptions Disp Refills   sertraline (ZOLOFT) 50 MG tablet 45 tablet 0    Sig: TAKE ONE-HALF TABLET BY MOUTH  DAILY     Psychiatry:  Antidepressants - SSRI - sertraline Passed - 07/04/2023 10:40 PM      Passed - AST in normal range and within 360 days    AST  Date Value Ref Range Status  06/27/2023 21 15 - 41 U/L Final   SGOT(AST)  Date Value Ref Range Status  08/31/2013 35 15 - 37 Unit/L Final         Passed - ALT in normal range and within 360 days    ALT  Date Value Ref Range Status  06/27/2023 13 0 - 44 U/L Final   SGPT (ALT)  Date Value Ref Range Status  08/31/2013 15 12 - 78 U/L Final         Passed - Completed PHQ-2 or PHQ-9 in the last 360 days      Passed - Valid encounter within last 6 months    Recent Outpatient Visits           2 weeks ago Shortness of breath   [place] [facility]   1 month ago Pneumonia of right lower lobe due to infectious  organism   [facility]   6 months ago Mixed hyperlipidemia   [place] [facility]   6 months ago Gastrointestinal hemorrhage, unspecified gastrointestinal hemorrhage type     [facility] at [facility]   7 months ago Acute cough   [facility] at [facility]       Future Appointments             In 5 days Duanne Limerick, MD [facility] Primary Care & Sports Medicine at [facility], [facility]

## 2023-07-06 NOTE — Telephone Encounter (Signed)
Patient will discuss machine at visit on Monday.  - Jeremy [name]

## 2023-07-07 ENCOUNTER — Telehealth: Payer: Self-pay

## 2023-07-07 ENCOUNTER — Telehealth: Payer: Self-pay | Admitting: Family Medicine

## 2023-07-07 DIAGNOSIS — I270 Primary pulmonary hypertension: Secondary | ICD-10-CM | POA: Diagnosis not present

## 2023-07-07 DIAGNOSIS — I700 Atherosclerosis of aorta: Secondary | ICD-10-CM | POA: Diagnosis not present

## 2023-07-07 DIAGNOSIS — F330 Major depressive disorder, recurrent, mild: Secondary | ICD-10-CM | POA: Diagnosis not present

## 2023-07-07 DIAGNOSIS — E785 Hyperlipidemia, unspecified: Secondary | ICD-10-CM | POA: Diagnosis not present

## 2023-07-07 DIAGNOSIS — D631 Anemia in chronic kidney disease: Secondary | ICD-10-CM | POA: Diagnosis not present

## 2023-07-07 DIAGNOSIS — I5033 Acute on chronic diastolic (congestive) heart failure: Secondary | ICD-10-CM | POA: Diagnosis not present

## 2023-07-07 DIAGNOSIS — N1831 Chronic kidney disease, stage 3a: Secondary | ICD-10-CM | POA: Diagnosis not present

## 2023-07-07 DIAGNOSIS — D500 Iron deficiency anemia secondary to blood loss (chronic): Secondary | ICD-10-CM | POA: Diagnosis not present

## 2023-07-07 DIAGNOSIS — E1142 Type 2 diabetes mellitus with diabetic polyneuropathy: Secondary | ICD-10-CM | POA: Diagnosis not present

## 2023-07-07 DIAGNOSIS — K7460 Unspecified cirrhosis of liver: Secondary | ICD-10-CM | POA: Diagnosis not present

## 2023-07-07 DIAGNOSIS — J441 Chronic obstructive pulmonary disease with (acute) exacerbation: Secondary | ICD-10-CM | POA: Diagnosis not present

## 2023-07-07 DIAGNOSIS — L89152 Pressure ulcer of sacral region, stage 2: Secondary | ICD-10-CM | POA: Diagnosis not present

## 2023-07-07 DIAGNOSIS — E1122 Type 2 diabetes mellitus with diabetic chronic kidney disease: Secondary | ICD-10-CM | POA: Diagnosis not present

## 2023-07-07 DIAGNOSIS — M5137 Other intervertebral disc degeneration, lumbosacral region: Secondary | ICD-10-CM | POA: Diagnosis not present

## 2023-07-07 DIAGNOSIS — J189 Pneumonia, unspecified organism: Secondary | ICD-10-CM | POA: Diagnosis not present

## 2023-07-07 DIAGNOSIS — E1169 Type 2 diabetes mellitus with other specified complication: Secondary | ICD-10-CM | POA: Diagnosis not present

## 2023-07-07 DIAGNOSIS — G8929 Other chronic pain: Secondary | ICD-10-CM | POA: Diagnosis not present

## 2023-07-07 DIAGNOSIS — E538 Deficiency of other specified B group vitamins: Secondary | ICD-10-CM | POA: Diagnosis not present

## 2023-07-07 DIAGNOSIS — Z6830 Body mass index (BMI) 30.0-30.9, adult: Secondary | ICD-10-CM | POA: Diagnosis not present

## 2023-07-07 DIAGNOSIS — G35 Multiple sclerosis: Secondary | ICD-10-CM | POA: Diagnosis not present

## 2023-07-07 DIAGNOSIS — J440 Chronic obstructive pulmonary disease with acute lower respiratory infection: Secondary | ICD-10-CM | POA: Diagnosis not present

## 2023-07-07 DIAGNOSIS — J9621 Acute and chronic respiratory failure with hypoxia: Secondary | ICD-10-CM | POA: Diagnosis not present

## 2023-07-07 DIAGNOSIS — Z794 Long term (current) use of insulin: Secondary | ICD-10-CM | POA: Diagnosis not present

## 2023-07-07 DIAGNOSIS — S51812D Laceration without foreign body of left forearm, subsequent encounter: Secondary | ICD-10-CM | POA: Diagnosis not present

## 2023-07-07 NOTE — Transitions of Care (Post Inpatient/ED Visit) (Signed)
07/07/2023  Name: Jeremy [name] MRN: [id number] DOB: 1951/05/12  Today's TOC FU Call Status: Today's TOC FU Call Status:: Successful TOC FU Call Completed TOC FU Call Complete Date: 07/07/23 Patient's Name and Date of Birth confirmed.  Transition Care Management Follow-up Telephone Call Date of Discharge: 07/05/23 Discharge Facility: [facility]) Type of Discharge: Inpatient Admission Primary Inpatient Discharge Diagnosis:: "acute on chronic resp failure with hypoxia" How have you been since you were released from the hospital?: Better (Wife states pt 'doing ok-but easily wears out when he exerts himself." He is using oxygen at 4-5L/min via [place]  24/7. Appetite good. LBM today.CBG 121 today.) Any questions or concerns?: Yes Patient Questions/Concerns:: Wife reprots pt was sent home to begin doing neb treatments to help with breathing. Med was sent to pharmacy and has been picked up but no neb machine was ordered. She states she called PCP office and was told she would have to wait until appt on Monday as MD on vacation. Patient Questions/Concerns Addressed: Notified Provider of Patient Questions/Concerns (RN CM called PCP office-s/w [place] to advise of recent hospital admission related to breathing and pt at increased risk for readmission-needing neb machine to use-pt currently gets oxygen from [facility]. Message to be sent to covering MDs)  Items Reviewed: Did you receive and understand the discharge instructions provided?: Yes Medications obtained,verified, and reconciled?: Yes (Medications Reviewed) (wife stares Vitamin D is being shipped by mail order pharmacy and they should have it in the next day or two) Any new allergies since your discharge?: No Dietary orders reviewed?: Yes Type of Diet Ordered:: low salt/heart healthy/carb modified Do you have support at home?: Yes People in Home: spouse Name of Support/Comfort Primary Source: Olegario Messier  Medications  Reviewed Today: Medications Reviewed Today     Reviewed by Charlyn Minerva, RN (Registered Nurse) on 07/07/23 at 1156  Med List Status: <None>   Medication Order Taking? Sig Documenting Provider Last Dose Status Informant  acetaminophen (TYLENOL) 500 MG tablet [id number] Yes Take 500 mg by mouth every 6 (six) hours as needed for mild pain. [provider] Taking Active Spouse/Significant Other, Pharmacy Records           Med Note Excell Seltzer, Gaetana Michaelis   Fri May 06, 2023  2:41 PM) Takes 2x tabs 30 minutes before Betaseron inj  albuterol (VENTOLIN HFA) 108 (90 Base) MCG/ACT inhaler [id number] Yes Inhale 2 puffs into the lungs in the morning, at noon, in the evening, and at bedtime. [provider] Taking Active Spouse/Significant Other, Pharmacy Records  ascorbic acid (VITAMIN C) 500 MG tablet [id number] Yes Take 1 tablet (500 mg total) by mouth daily. Marcelino Duster, MD Taking Active   cyanocobalamin (VITAMIN B12) 500 MCG tablet [id number] Yes Take 1,000 mcg by mouth daily. [provider] Taking Active Spouse/Significant Other, Pharmacy Records  fluticasone [facility]) 50 MCG/ACT nasal spray [id number] Yes Place 1 spray into both nostrils daily. Marcelino Duster, MD Taking Active   folic acid (FOLVITE) 1 MG tablet [id number] Yes Take 1 tablet (1 mg total) by mouth daily. Marcelino Duster, MD Taking Active   furosemide (LASIX) 40 MG tablet [id number] Yes Take 0.5 tablets (20 mg total) by mouth every other day. 1/2 pill every other day- Lelon Perla, MD Taking Active   gemfibrozil (LOPID) 600 MG tablet [id number] Yes TAKE 1 TABLET BY MOUTH TWICE  DAILY BEFORE MEALS Duanne Limerick, MD Taking Active Spouse/Significant Other, Pharmacy Records  guaiFENesin (MUCINEX) 600 MG 12 hr tablet  [id number] Yes Take 1 tablet (600 mg total) by mouth 2 (two) times daily. Marcelino Duster, MD Taking Active   insulin glargine (LANTUS) 100 UNIT/ML Solostar Pen  [id number] Yes Inject 7 Units into the skin daily. Marcelino Duster, MD Taking Active   Interferon Beta-1b (BETASERON) 0.3 MG KIT injection [id number] Yes Inject subcutaneously 1 syringe every other day.  Patient taking differently: Inject 0.25 mg into the skin every other day. Inject subcutaneously 1 syringe every other day.   Levert Feinstein, MD Taking Active Spouse/Significant Other, Pharmacy Records           Med Note Excell Seltzer, Gaetana Michaelis   Fri May 06, 2023  2:42 PM) Next inj 7/12; home med  ipratropium-albuterol (DUONEB) 0.5-2.5 (3) MG/3ML SOLN [id number]  Take 3 mLs by nebulization every 4 (four) hours as needed. Marcelino Duster, MD  Active   iron polysaccharides (NIFEREX) 150 MG capsule [id number] Yes Take 1 capsule (150 mg total) by mouth daily. Marcelino Duster, MD Taking Active   lidocaine (LIDODERM) 5 % [id number] Yes Place 1 patch onto the skin daily. Remove & Discard patch within 12 hours or as directed by MD Enedina Finner, MD Taking Active   loratadine (CLARITIN) 10 MG tablet [id number] Yes Take 1 tablet (10 mg total) by mouth daily. Duanne Limerick, MD Taking Active Spouse/Significant Other, Pharmacy Records  mupirocin ointment (BACTROBAN) 2 % [id number] Yes Apply 1 Application topically 2 (two) times daily. Apply to effected area bid Duanne Limerick, MD Taking Active Spouse/Significant Other, Pharmacy Records           Med Note Excell Seltzer, Gaetana Michaelis   Fri May 06, 2023  2:45 PM) Patient takes as needed for skin tears  nystatin (MYCOSTATIN/NYSTOP) powder [id number] Yes Apply topically 3 (three) times daily. Enedina Finner, MD Taking Active   omeprazole (PRILOSEC) 40 MG capsule [id number] Yes Take 1 capsule (40 mg total) by mouth daily. Enedina Finner, MD Taking Active   pregabalin (LYRICA) 200 MG capsule [id number] Yes Take 1 capsule (200 mg total) by mouth 2 (two) times daily. Edward Jolly, MD Taking Active Spouse/Significant Other, Pharmacy Records  promethazine-dextromethorphan (PROMETHAZINE-DM)  6.25-15 MG/5ML syrup [id number] Yes Take 5 mLs by mouth 4 (four) times daily as needed for cough. Duanne Limerick, MD Taking Active   sertraline (ZOLOFT) 50 MG tablet [id number] Yes TAKE ONE-HALF TABLET BY MOUTH  DAILY Duanne Limerick, MD Taking Active   simvastatin (ZOCOR) 40 MG tablet [id number] Yes Take 1 tablet (40 mg total) by mouth daily. Duanne Limerick, MD Taking Active Spouse/Significant Other, Pharmacy Records  tamsulosin [facility]) 0.4 MG CAPS capsule [id number] Yes Take 1 capsule (0.4 mg total) by mouth daily after supper. Enedina Finner, MD Taking Active   traMADol Janean Sark) 50 MG tablet [id number] Yes Take 1 tablet (50 mg total) by mouth every 6 (six) hours as needed. Edward Jolly, MD Taking Active Spouse/Significant Other, Pharmacy Records           Med Note Excell Seltzer, Gaetana Michaelis   Fri May 06, 2023  2:47 PM) Patient takes 4x daily regularly for pain  Vitamin D, Ergocalciferol, (DRISDOL) 1.25 MG (50000 UNIT) CAPS capsule [id number]  Take 1 capsule (50,000 Units total) by mouth every 7 (seven) days. Marcelino Duster, MD  Active   Med List Note Nonah Mattes, RN 12/30/22 1005): UDS 12/16/22 MR 04/07/23 Pain contracts signed 12/30/22            Home Care and Equipment/Supplies: Were Home Health Services Ordered?: Yes Name of Home  Health Agency:: [facility] Has Agency set up a time to come to your home?: Yes First Home Health Visit Date: 07/07/23 (wife voices staff called and supposed to be there around 1pm) Any new equipment or medical supplies ordered?: Yes Name of Medical supply agency?: [facility] Were you able to get the equipment/medical supplies?: Yes Do you have any questions related to the use of the equipment/supplies?: No  Functional Questionnaire: Do you need assistance with bathing/showering or dressing?: Yes (wife assists with all ADLs/IADLs) Do you need assistance with meal preparation?: Yes Do you need assistance with eating?: No Do you have difficulty  maintaining continence: No Do you need assistance with getting out of bed/getting out of a chair/moving?: Yes Do you have difficulty managing or taking your medications?: Yes  Follow up appointments reviewed: PCP Follow-up appointment confirmed?: Yes Date of PCP follow-up appointment?: 07/11/23 Follow-up Provider: Dr. Yetta Barre Specialist [facility] Follow-up appointment confirmed?: Yes Date of Specialist follow-up appointment?: 07/20/23 Follow-Up Specialty Provider:: Dr. Cathie Hoops, Do you need transportation to your follow-up appointment?: No Do you understand care options if your condition(s) worsen?: Yes-patient verbalized understanding  SDOH Interventions Today    Flowsheet Row Most Recent Value  SDOH Interventions   Food Insecurity Interventions Intervention Not Indicated  Transportation Interventions Intervention Not Indicated      TOC Interventions Today    Flowsheet Row Most Recent Value  TOC Interventions   TOC Interventions Discussed/Reviewed TOC Interventions Discussed, Contacted provider for patient needs, S/S of infection  [Contacted PCP office to advise of neb machine issue]      Interventions Today    Flowsheet Row Most Recent Value  Chronic Disease   Chronic disease during today's visit Chronic Obstructive Pulmonary Disease (COPD), Diabetes, Congestive Heart Failure (CHF)  General Interventions   General Interventions Discussed/Reviewed General Interventions Discussed, Doctor Visits, Referral to Nurse, Durable Medical Equipment (DME)  [pt already active with assigned RN care coordinator-reviewed upcoming appt with wife]  Doctor Visits Discussed/Reviewed Doctor Visits Discussed, PCP, Specialist  Durable Medical Equipment (DME) Glucomoter, Oxygen, Other  [neb machine]  PCP/Specialist Visits Compliance with follow-up visit  Education Interventions   Education Provided Provided Education  Provided Verbal Education On Nutrition, When to see the doctor, Blood Sugar Monitoring   Nutrition Interventions   Nutrition Discussed/Reviewed Nutrition Discussed, Decreasing salt, Decreasing fats, Increasing proteins, Decreasing sugar intake, Fluid intake, Adding fruits and vegetables  Pharmacy Interventions   Pharmacy Dicussed/Reviewed Pharmacy Topics Discussed, Medications and their functions  Safety Interventions   Safety Discussed/Reviewed Safety Discussed       Alessandra Grout [facility] Care Management Care Management Community Coordinator Direct Phone: [phone] Toll Free: [phone] Fax: [phone]

## 2023-07-07 NOTE — Telephone Encounter (Signed)
Home Health Verbal Orders - Caller/Agency: Elnita Maxwell from [employer] Number: [phone] Requesting Skilled Nursing Frequency: 1x9

## 2023-07-07 NOTE — Telephone Encounter (Signed)
Meliton Rattan Nurse with [facility] is calling stating that the pt was discharged on 07/05/2023. Pt was seen for breathing and respiratory problems. Pt was sent home with nebulizer treatments but no nebulizer machine. Meliton Rattan is wanting to see if pt PCP can send in a prescription for a nebulizer machine to [facility].  Meliton Rattan states that she did speak with pt wife and she was told that he would have to wait for his appt with PCP on Monday to talk about the nebulizer machine due to PCP being OOO. Meliton Rattan is wanting to know if another PCP in the office could send the prescription to [facility]. Meliton Rattan is afraid that the pt might be back in the hospital if he has to wait to see his PCP Monday for the prescription for the machine. Please advise.    [facility] phone number: [phone]

## 2023-07-07 NOTE — Telephone Encounter (Signed)
PC to [facility] waiting on them to call to see if his insurance covers this neb we have in stock. Will call back door line

## 2023-07-07 NOTE — Telephone Encounter (Signed)
dup

## 2023-07-07 NOTE — Telephone Encounter (Signed)
Jeremy [name] called to see if insurance will cover the nebulizer that we have in the office.  KP

## 2023-07-07 NOTE — Telephone Encounter (Signed)
Copied from CRM [id number]. Topic: General - Other >> Jul 07, 2023  1:50 PM Turkey B wrote: Reason for CRM: Elnita Maxwell From [facility], called in about getting nebulizer macine ordered for pt to be sent to [facility] and they will get it out to the pt.

## 2023-07-08 ENCOUNTER — Telehealth: Payer: Self-pay | Admitting: Family Medicine

## 2023-07-08 DIAGNOSIS — J9621 Acute and chronic respiratory failure with hypoxia: Secondary | ICD-10-CM | POA: Diagnosis not present

## 2023-07-08 DIAGNOSIS — E1122 Type 2 diabetes mellitus with diabetic chronic kidney disease: Secondary | ICD-10-CM | POA: Diagnosis not present

## 2023-07-08 DIAGNOSIS — I270 Primary pulmonary hypertension: Secondary | ICD-10-CM | POA: Diagnosis not present

## 2023-07-08 DIAGNOSIS — I5033 Acute on chronic diastolic (congestive) heart failure: Secondary | ICD-10-CM | POA: Diagnosis not present

## 2023-07-08 DIAGNOSIS — D631 Anemia in chronic kidney disease: Secondary | ICD-10-CM | POA: Diagnosis not present

## 2023-07-08 DIAGNOSIS — N1831 Chronic kidney disease, stage 3a: Secondary | ICD-10-CM | POA: Diagnosis not present

## 2023-07-08 NOTE — Telephone Encounter (Signed)
Pt's wife Deatra Ina is calling in requesting to speak with Kristi. Deatra Ina says she spoke with [place] yesterday and she was supposed to get back to her but she hasn't heard anything.

## 2023-07-08 NOTE — Telephone Encounter (Signed)
Duplicate message is open.  KP

## 2023-07-08 NOTE — Telephone Encounter (Signed)
They called back and stated that they don't know what insurance companies cover what. They stated that they would have to process the application and run it thru insurance first. Waiting to see if a nasal canula can be used on pts oxygen tank. Completed an application online not sure of the results.  KP

## 2023-07-08 NOTE — Telephone Encounter (Signed)
Spoke to pt she stated that instead of running the nebulizer through insurance that she is going to [employer] and buying it out of pocket.  KP

## 2023-07-11 ENCOUNTER — Ambulatory Visit (INDEPENDENT_AMBULATORY_CARE_PROVIDER_SITE_OTHER): Payer: Medicare Other | Admitting: Family Medicine

## 2023-07-11 ENCOUNTER — Encounter: Payer: Self-pay | Admitting: Family Medicine

## 2023-07-11 VITALS — BP 130/72 | HR 80 | Ht 70.0 in | Wt 214.0 lb

## 2023-07-11 DIAGNOSIS — J301 Allergic rhinitis due to pollen: Secondary | ICD-10-CM | POA: Diagnosis not present

## 2023-07-11 DIAGNOSIS — K219 Gastro-esophageal reflux disease without esophagitis: Secondary | ICD-10-CM | POA: Diagnosis not present

## 2023-07-11 DIAGNOSIS — F331 Major depressive disorder, recurrent, moderate: Secondary | ICD-10-CM | POA: Diagnosis not present

## 2023-07-11 DIAGNOSIS — Z23 Encounter for immunization: Secondary | ICD-10-CM | POA: Diagnosis not present

## 2023-07-11 DIAGNOSIS — N400 Enlarged prostate without lower urinary tract symptoms: Secondary | ICD-10-CM

## 2023-07-11 DIAGNOSIS — E782 Mixed hyperlipidemia: Secondary | ICD-10-CM | POA: Diagnosis not present

## 2023-07-11 MED ORDER — SERTRALINE HCL 50 MG PO TABS
ORAL_TABLET | ORAL | 1 refills | Status: AC
Start: 2023-07-11 — End: ?

## 2023-07-11 MED ORDER — GEMFIBROZIL 600 MG PO TABS
600.0000 mg | ORAL_TABLET | Freq: Two times a day (BID) | ORAL | 1 refills | Status: AC
Start: 2023-07-11 — End: ?

## 2023-07-11 MED ORDER — OMEPRAZOLE 40 MG PO CPDR
40.0000 mg | DELAYED_RELEASE_CAPSULE | Freq: Every day | ORAL | 1 refills | Status: AC
Start: 2023-07-11 — End: ?

## 2023-07-11 MED ORDER — TAMSULOSIN HCL 0.4 MG PO CAPS
0.4000 mg | ORAL_CAPSULE | Freq: Every day | ORAL | 1 refills | Status: AC
Start: 2023-07-11 — End: ?

## 2023-07-11 MED ORDER — SIMVASTATIN 40 MG PO TABS
40.0000 mg | ORAL_TABLET | Freq: Every day | ORAL | 1 refills | Status: AC
Start: 2023-07-11 — End: ?

## 2023-07-11 NOTE — Progress Notes (Signed)
Date:  07/11/2023   Name:  Jeremy [name]   DOB:  1950-11-05   MRN:  [id number]   Chief Complaint: Benign Prostatic Hypertrophy, Hyperlipidemia, Gastroesophageal Reflux, Depression, and Flu Vaccine  Benign Prostatic Hypertrophy This is a chronic problem. The current episode started more than 1 year ago. Irritative symptoms do not include frequency, nocturia or urgency. Obstructive symptoms do not include incomplete emptying, a slower stream or a weak stream. Pertinent negatives include no hematuria. Past treatments include tamsulosin. The treatment provided moderate relief.  Hyperlipidemia This is a chronic problem. The current episode started more than 1 year ago. The problem is controlled. Recent lipid tests were reviewed and are normal. Pertinent negatives include no chest pain, myalgias or shortness of breath. Current antihyperlipidemic treatment includes statins and fibric acid derivatives. There are no compliance problems.   Gastroesophageal Reflux He reports no abdominal pain, no belching, no chest pain, no coughing, no dysphagia, no early satiety, no globus sensation, no heartburn, no hoarse voice, no sore throat, no stridor or no wheezing. This is a recurrent problem. The problem has been gradually improving. The symptoms are aggravated by certain foods. Pertinent negatives include no fatigue, melena, muscle weakness, orthopnea or weight loss. He has tried a PPI for the symptoms. The treatment provided moderate relief.  Depression        The current episode started more than 1 year ago.   The problem has been gradually improving since onset.  Associated symptoms include no decreased concentration, no fatigue, no helplessness, no hopelessness, does not have insomnia, not irritable, no restlessness, no decreased interest, no appetite change, no body aches, no myalgias, no headaches, no indigestion, not sad and no suicidal ideas.     The symptoms are aggravated by nothing.  Past treatments  include SSRIs - Selective serotonin reuptake inhibitors.  Previous treatment provided moderate relief.   Lab Results  Component Value Date   NA 142 07/04/2023   K 4.1 07/04/2023   CO2 29 07/04/2023   GLUCOSE 98 07/04/2023   BUN 41 (H) 07/04/2023   CREATININE 1.28 (H) 07/04/2023   CALCIUM 8.1 (L) 07/04/2023   EGFR 53 (L) 02/15/2022   GFRNONAA 60 (L) 07/04/2023   Lab Results  Component Value Date   CHOL 103 07/10/2021   HDL 22 (L) 07/10/2021   LDLCALC 35 07/10/2021   TRIG 228 (H) 07/10/2021   CHOLHDL 4.7 07/10/2021   Lab Results  Component Value Date   TSH 1.90 03/30/2021   Lab Results  Component Value Date   HGBA1C 4.6 06/08/2023   Lab Results  Component Value Date   WBC 5.8 07/02/2023   HGB 8.8 (L) 07/02/2023   HCT 28.1 (L) 07/02/2023   MCV 87.8 07/02/2023   PLT 198 07/02/2023   Lab Results  Component Value Date   ALT 13 06/27/2023   AST 21 06/27/2023   ALKPHOS 66 06/27/2023   BILITOT 1.3 (H) 06/27/2023   Lab Results  Component Value Date   VD25OH 9.07 (L) 06/22/2023     Review of Systems  Constitutional:  Negative for appetite change, fatigue and weight loss.  HENT:  Negative for hoarse voice, postnasal drip, rhinorrhea, sinus pressure, sinus pain, sneezing and sore throat.   Eyes:  Negative for visual disturbance.  Respiratory:  Positive for chest tightness. Negative for cough, shortness of breath, wheezing and stridor.   Cardiovascular:  Negative for chest pain, palpitations and leg swelling.  Gastrointestinal:  Negative for abdominal pain, blood in stool, constipation,  diarrhea, dysphagia, heartburn and melena.  Genitourinary:  Negative for frequency, hematuria, incomplete emptying, nocturia and urgency.  Musculoskeletal:  Negative for myalgias and muscle weakness.  Neurological:  Negative for headaches.  Psychiatric/Behavioral:  Positive for depression. Negative for decreased concentration and suicidal ideas. The patient does not have insomnia.      Patient Active Problem List   Diagnosis Date Noted   COPD exacerbation (HCC) 06/21/2023   Obesity (BMI 30-39.9) 06/21/2023   Hyponatremia 06/21/2023   Pleural effusion 06/21/2023   Urinary retention 06/21/2023   Myocardial injury 05/11/2023   Moderate pulmonary hypertension (HCC) 05/09/2023   Cirrhosis of liver without ascites (HCC) 05/08/2023   Acute on chronic diastolic CHF (congestive heart failure) (HCC) 05/08/2023   Symptomatic anemia 05/06/2023   GI bleed 11/24/2022   Arteriovenous malformation (AVM) 10/21/2022   Generalized weakness 08/18/2022   AKI (acute kidney injury) (HCC) 08/18/2022   Severe sepsis (HCC) 02/04/2022   Right lower lobe pneumonia 02/03/2022   Acute on chronic respiratory failure with hypoxia (HCC) 02/03/2022   Hyperkalemia 02/03/2022   COPD (chronic obstructive pulmonary disease) (HCC) 02/03/2022   SIRS (systemic inflammatory response syndrome), possible sepsis (HCC) 02/03/2022   Chronic blood loss anemia 09/24/2021   Gastric polyp    Gastritis without bleeding    Anemia in chronic kidney disease (CKD) 06/19/2021   Iron deficiency anemia due to chronic blood loss 06/19/2021   Gait abnormality 05/07/2021   Stage 3a chronic kidney disease (HCC) 05/20/2020   DM type 2 with diabetic peripheral neuropathy (HCC) 05/06/2020   Idiopathic peripheral neuropathy 01/15/2020   Generalized edema 01/15/2020   Primary pulmonary hypertension (HCC) 10/28/2019   Therapeutic drug monitoring 03/23/2018   Mild episode of recurrent major depressive disorder (HCC) 09/26/2017   Ventricular ectopic beats 09/26/2017   Type 2 diabetes mellitus with diabetic polyneuropathy, with long-term current use of insulin (HCC) 08/12/2017   Hyperlipidemia due to type 2 diabetes mellitus (HCC) 08/12/2017   B12 deficiency 12/14/2016   Low back pain 03/10/2016   Lumbosacral disc disease 01/01/2016   Neuropathy associated with endocrine disorder (HCC) 11/19/2015   Iron deficiency anemia  06/03/2015   Essential hypertension 06/03/2015   Hyperlipidemia 06/03/2015   Depression 06/03/2015   Gastroesophageal reflux disease without esophagitis 06/03/2015   Edema extremities 06/03/2015   Multiple sclerosis (HCC) 07/26/2013   Abnormality of gait 07/26/2013   Morbid obesity (HCC) 07/26/2013    Allergies  Allergen Reactions   Betadine [Povidone Iodine]     Past Surgical History:  Procedure Laterality Date   BIOPSY  05/07/2023   Procedure: BIOPSY;  Surgeon: Regis Bill, MD;  Location: [facility] ENDOSCOPY;  Service: Endoscopy;;   COLECTOMY  06-2008   COLONOSCOPY  2015   normal   COLONOSCOPY WITH PROPOFOL N/A 09/10/2021   Procedure: COLONOSCOPY WITH PROPOFOL;  Surgeon: Midge Minium, MD;  Location: [facility] ENDOSCOPY;  Service: Endoscopy;  Laterality: N/A;   ENTEROSCOPY N/A 05/07/2023   Procedure: ENTEROSCOPY;  Surgeon: Regis Bill, MD;  Location: [facility] ENDOSCOPY;  Service: Endoscopy;  Laterality: N/A;   ESOPHAGOGASTRODUODENOSCOPY (EGD) WITH PROPOFOL N/A 09/10/2021   Procedure: ESOPHAGOGASTRODUODENOSCOPY (EGD) WITH PROPOFOL;  Surgeon: Midge Minium, MD;  Location: [facility] ENDOSCOPY;  Service: Endoscopy;  Laterality: N/A;   GIVENS CAPSULE STUDY N/A 09/25/2021   Procedure: GIVENS CAPSULE STUDY;  Surgeon: Wyline Mood, MD;  Location: [facility] ENDOSCOPY;  Service: Gastroenterology;  Laterality: N/A;    Social History   Tobacco Use   Smoking status: Former    Current packs/day: 0.00  Average packs/day: 1.5 packs/day for 30.0 years (45.0 ttl pk-yrs)    Types: Cigarettes    Start date: 01/24/1976    Quit date: 01/23/2006    Years since quitting: 17.4   Smokeless tobacco: Never   Tobacco comments:    N/A  Vaping Use   Vaping status: Never Used  Substance Use Topics   Alcohol use: Not Currently    Comment: rare; maybe 2 beers a year   Drug use: No     Medication list has been reviewed and updated.  Current Meds  Medication Sig   acetaminophen (TYLENOL) 500 MG tablet Take  500 mg by mouth every 6 (six) hours as needed for mild pain.   albuterol (VENTOLIN HFA) 108 (90 Base) MCG/ACT inhaler Inhale 2 puffs into the lungs in the morning, at noon, in the evening, and at bedtime.   ascorbic acid (VITAMIN C) 500 MG tablet Take 1 tablet (500 mg total) by mouth daily.   cyanocobalamin (VITAMIN B12) 500 MCG tablet Take 1,000 mcg by mouth daily.   fluticasone (FLONASE) 50 MCG/ACT nasal spray Place 1 spray into both nostrils daily.   folic acid (FOLVITE) 1 MG tablet Take 1 tablet (1 mg total) by mouth daily.   furosemide (LASIX) 40 MG tablet Take 0.5 tablets (20 mg total) by mouth every other day. 1/2 pill every other day- Singh   insulin glargine (LANTUS) 100 UNIT/ML Solostar Pen Inject 7 Units into the skin daily.   Interferon Beta-1b (BETASERON) 0.3 MG KIT injection Inject subcutaneously 1 syringe every other day. (Patient taking differently: Inject 0.25 mg into the skin every other day. Inject subcutaneously 1 syringe every other day.)   ipratropium-albuterol (DUONEB) 0.5-2.5 (3) MG/3ML SOLN Take 3 mLs by nebulization every 4 (four) hours as needed.   iron polysaccharides (NIFEREX) 150 MG capsule Take 1 capsule (150 mg total) by mouth daily.   lidocaine (LIDODERM) 5 % Place 1 patch onto the skin daily. Remove & Discard patch within 12 hours or as directed by MD   loratadine (CLARITIN) 10 MG tablet Take 1 tablet (10 mg total) by mouth daily.   mupirocin ointment (BACTROBAN) 2 % Apply 1 Application topically 2 (two) times daily. Apply to effected area bid   nystatin (MYCOSTATIN/NYSTOP) powder Apply topically 3 (three) times daily.   pregabalin (LYRICA) 200 MG capsule Take 1 capsule (200 mg total) by mouth 2 (two) times daily.   Vitamin D, Ergocalciferol, (DRISDOL) 1.25 MG (50000 UNIT) CAPS capsule Take 1 capsule (50,000 Units total) by mouth every 7 (seven) days.   [DISCONTINUED] gemfibrozil (LOPID) 600 MG tablet TAKE 1 TABLET BY MOUTH TWICE  DAILY BEFORE MEALS    [DISCONTINUED] omeprazole (PRILOSEC) 40 MG capsule Take 1 capsule (40 mg total) by mouth daily.   [DISCONTINUED] sertraline (ZOLOFT) 50 MG tablet TAKE ONE-HALF TABLET BY MOUTH  DAILY   [DISCONTINUED] simvastatin (ZOCOR) 40 MG tablet Take 1 tablet (40 mg total) by mouth daily.   [DISCONTINUED] tamsulosin (FLOMAX) 0.4 MG CAPS capsule Take 1 capsule (0.4 mg total) by mouth daily after supper.       07/11/2023    2:49 PM 05/27/2023    2:41 PM 12/31/2022    1:16 PM 12/10/2022    2:19 PM  GAD 7 : Generalized Anxiety Score  Nervous, Anxious, on Edge 0 0 0 0  Control/stop worrying 0 0 0 0  Worry too much - different things 0 0 0 0  Trouble relaxing 0 0 0 0  Restless 0 0  0 0  Easily annoyed or irritable 0 0 0 0  Afraid - awful might happen 0 0 0 0  Total GAD 7 Score 0 0 0 0  Anxiety Difficulty Not difficult at all Not difficult at all Not difficult at all Not difficult at all       07/11/2023    2:49 PM 05/27/2023    2:39 PM 12/31/2022    1:16 PM  Depression screen PHQ 2/9  Decreased Interest 0 0 0  Down, Depressed, Hopeless 0 0 0  PHQ - 2 Score 0 0 0  Altered sleeping 0 0 0  Tired, decreased energy 0 0 0  Change in appetite 0 0 0  Feeling bad or failure about yourself  0 0 0  Trouble concentrating 0 0 0  Moving slowly or fidgety/restless 0 0 0  Suicidal thoughts 0 0 0  PHQ-9 Score 0 0 0  Difficult doing work/chores Not difficult at all Not difficult at all Not difficult at all    BP Readings from Last 3 Encounters:  07/11/23 130/72  07/05/23 (!) 151/66  06/21/23 (!) 120/50    Physical Exam Vitals and nursing note reviewed.  Constitutional:      General: He is not irritable. HENT:     Head: Normocephalic.     Right Ear: Tympanic membrane and external ear normal.     Left Ear: Tympanic membrane and external ear normal.     Nose: Nose normal. No congestion or rhinorrhea.     Mouth/Throat:     Mouth: Mucous membranes are moist.  Eyes:     General: No scleral icterus.        Right eye: No discharge.        Left eye: No discharge.     Conjunctiva/sclera: Conjunctivae normal.     Pupils: Pupils are equal, round, and reactive to light.  Neck:     Thyroid: No thyromegaly.     Vascular: No JVD.     Trachea: No tracheal deviation.  Cardiovascular:     Rate and Rhythm: Normal rate and regular rhythm.     Heart sounds: Normal heart sounds. No murmur heard.    No friction rub. No gallop.  Pulmonary:     Effort: No respiratory distress.     Breath sounds: Normal breath sounds. No wheezing, rhonchi or rales.  Chest:     Chest wall: No tenderness.  Abdominal:     General: Bowel sounds are normal.     Palpations: Abdomen is soft. There is no mass.     Tenderness: There is no abdominal tenderness. There is no guarding or rebound.  Musculoskeletal:        General: No tenderness. Normal range of motion.     Cervical back: Normal range of motion and neck supple.  Lymphadenopathy:     Cervical: No cervical adenopathy.  Skin:    General: Skin is warm.     Findings: No rash.  Neurological:     Mental Status: He is alert and oriented to person, place, and time.     Cranial Nerves: No cranial nerve deficit.     Deep Tendon Reflexes: Reflexes are normal and symmetric.     Wt Readings from Last 3 Encounters:  07/11/23 214 lb (97.1 kg)  07/05/23 214 lb 15.2 oz (97.5 kg)  06/08/23 206 lb (93.4 kg)    BP 130/72   Pulse 80   Ht 5\' 10"  (1.778 m)   Wt 214 lb (97.1  kg)   BMI 30.71 kg/m   Assessment and Plan:  1. Mixed hyperlipidemia Chronic.  Controlled.  Stable.  Review of previous lipid panel is acceptable.  Continue gemfibrozil 600 mg twice a day and simvastatin 40 mg once a day. - gemfibrozil (LOPID) 600 MG tablet; Take 1 tablet (600 mg total) by mouth 2 (two) times daily before a meal.  Dispense: 180 tablet; Refill: 1 - simvastatin (ZOCOR) 40 MG tablet; Take 1 tablet (40 mg total) by mouth daily.  Dispense: 90 tablet; Refill: 1  2. Seasonal allergic  rhinitis due to pollen Chronic.  Controlled.  Stable.  Continue loratadine 10 mg once a day.  Will recheck in 1 year.  3. Gastroesophageal reflux disease without esophagitis Chronic.  Controlled.  Stable.  Tolerating foods well as long as he is on his omeprazole 40 mg once a day.  Will recheck in 1 year. - omeprazole (PRILOSEC) 40 MG capsule; Take 1 capsule (40 mg total) by mouth daily.  Dispense: 90 capsule; Refill: 1  4. 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  Controlled.  Stable.  PHQ is 0 GAD score 0 continue sertraline 50 mg 1/2 tablet daily. - sertraline (ZOLOFT) 50 MG tablet; TAKE ONE-HALF TABLET BY MOUTH  DAILY  Dispense: 90 tablet; Refill: 1  5. Benign prostatic hyperplasia without lower urinary tract symptoms Chronic.  Controlled.  Stable.  Asymptomatic.  Minimal nocturia with normal stream.  Continue tamsulosin 0.4 mg daily. - tamsulosin (FLOMAX) 0.4 MG CAPS capsule; Take 1 capsule (0.4 mg total) by mouth daily after supper.  Dispense: 90 capsule; Refill: 1  6. Need for immunization against influenza Discussed and administered. - Flu Vaccine Trivalent High Dose (Fluad)    Elizabeth Sauer, MD

## 2023-07-12 ENCOUNTER — Telehealth: Payer: Self-pay | Admitting: Family Medicine

## 2023-07-12 ENCOUNTER — Telehealth: Payer: Self-pay

## 2023-07-12 DIAGNOSIS — E1122 Type 2 diabetes mellitus with diabetic chronic kidney disease: Secondary | ICD-10-CM | POA: Diagnosis not present

## 2023-07-12 DIAGNOSIS — J9621 Acute and chronic respiratory failure with hypoxia: Secondary | ICD-10-CM | POA: Diagnosis not present

## 2023-07-12 DIAGNOSIS — D631 Anemia in chronic kidney disease: Secondary | ICD-10-CM | POA: Diagnosis not present

## 2023-07-12 DIAGNOSIS — N1831 Chronic kidney disease, stage 3a: Secondary | ICD-10-CM | POA: Diagnosis not present

## 2023-07-12 DIAGNOSIS — I270 Primary pulmonary hypertension: Secondary | ICD-10-CM | POA: Diagnosis not present

## 2023-07-12 DIAGNOSIS — I5033 Acute on chronic diastolic (congestive) heart failure: Secondary | ICD-10-CM | POA: Diagnosis not present

## 2023-07-12 NOTE — Telephone Encounter (Signed)
Copied from CRM [id number]. Topic: General - Inquiry >> Jul 12, 2023  2:12 PM Haroldine Laws wrote: reason for CRM: Alona Bene with [facility] called to see if the office has received an order for a neurology screening and if so when can they expect it to be signed and faxed back  CB#  [phone]

## 2023-07-12 NOTE — Telephone Encounter (Signed)
Returned call to Jeremy [name] from [facility] concerning neurology screening on pt- I told her he has a neurologist and they would order that if they wanted to proceed with testing. CB- [phone]

## 2023-07-13 ENCOUNTER — Telehealth: Payer: Self-pay

## 2023-07-13 ENCOUNTER — Telehealth: Payer: Self-pay | Admitting: Family Medicine

## 2023-07-13 NOTE — Telephone Encounter (Signed)
Called left VM giving verbal orders.   KP

## 2023-07-13 NOTE — Telephone Encounter (Signed)
Called pt back- they do not want the [facility] [employer] supplies for mattress, rails, etc. I faxed the form back with a message that states pt does not want to [phone]

## 2023-07-13 NOTE — Telephone Encounter (Signed)
Patient's wife Deatra Ina has called back returning Tara's call. Please advise & call back @ # [phone]. (Spoke with Marylene Land at the front and advised patient Jeremy [name] would call back in 10 minutes).

## 2023-07-13 NOTE — Telephone Encounter (Signed)
Called pt and left message concerning Jeremy [name] [employer] company request for mattress and other supplies?

## 2023-07-13 NOTE — Telephone Encounter (Signed)
Home Health Verbal Orders - Caller/Agency: Cindy[facility] Callback Number:  [phone] Requesting OT/PT/Skilled Nursing/Social Work/Speech Therapy: PT Frequency: 1w1,2w4,1w4  Requesting OT evaluation.

## 2023-07-14 ENCOUNTER — Encounter: Payer: Medicare Other | Admitting: Student in an Organized Health Care Education/Training Program

## 2023-07-14 DIAGNOSIS — D631 Anemia in chronic kidney disease: Secondary | ICD-10-CM | POA: Diagnosis not present

## 2023-07-14 DIAGNOSIS — I270 Primary pulmonary hypertension: Secondary | ICD-10-CM | POA: Diagnosis not present

## 2023-07-14 DIAGNOSIS — J9621 Acute and chronic respiratory failure with hypoxia: Secondary | ICD-10-CM | POA: Diagnosis not present

## 2023-07-14 DIAGNOSIS — N1831 Chronic kidney disease, stage 3a: Secondary | ICD-10-CM | POA: Diagnosis not present

## 2023-07-14 DIAGNOSIS — E1122 Type 2 diabetes mellitus with diabetic chronic kidney disease: Secondary | ICD-10-CM | POA: Diagnosis not present

## 2023-07-14 DIAGNOSIS — I5033 Acute on chronic diastolic (congestive) heart failure: Secondary | ICD-10-CM | POA: Diagnosis not present

## 2023-07-15 DIAGNOSIS — J9621 Acute and chronic respiratory failure with hypoxia: Secondary | ICD-10-CM | POA: Diagnosis not present

## 2023-07-15 DIAGNOSIS — E1122 Type 2 diabetes mellitus with diabetic chronic kidney disease: Secondary | ICD-10-CM | POA: Diagnosis not present

## 2023-07-15 DIAGNOSIS — D631 Anemia in chronic kidney disease: Secondary | ICD-10-CM | POA: Diagnosis not present

## 2023-07-15 DIAGNOSIS — I5033 Acute on chronic diastolic (congestive) heart failure: Secondary | ICD-10-CM | POA: Diagnosis not present

## 2023-07-15 DIAGNOSIS — I270 Primary pulmonary hypertension: Secondary | ICD-10-CM | POA: Diagnosis not present

## 2023-07-15 DIAGNOSIS — N1831 Chronic kidney disease, stage 3a: Secondary | ICD-10-CM | POA: Diagnosis not present

## 2023-07-18 ENCOUNTER — Telehealth: Payer: Self-pay | Admitting: Family Medicine

## 2023-07-18 ENCOUNTER — Telehealth: Payer: Self-pay | Admitting: Student in an Organized Health Care Education/Training Program

## 2023-07-18 DIAGNOSIS — I270 Primary pulmonary hypertension: Secondary | ICD-10-CM | POA: Diagnosis not present

## 2023-07-18 DIAGNOSIS — I5033 Acute on chronic diastolic (congestive) heart failure: Secondary | ICD-10-CM | POA: Diagnosis not present

## 2023-07-18 DIAGNOSIS — N1831 Chronic kidney disease, stage 3a: Secondary | ICD-10-CM | POA: Diagnosis not present

## 2023-07-18 DIAGNOSIS — J9621 Acute and chronic respiratory failure with hypoxia: Secondary | ICD-10-CM | POA: Diagnosis not present

## 2023-07-18 DIAGNOSIS — D631 Anemia in chronic kidney disease: Secondary | ICD-10-CM | POA: Diagnosis not present

## 2023-07-18 DIAGNOSIS — E1122 Type 2 diabetes mellitus with diabetic chronic kidney disease: Secondary | ICD-10-CM | POA: Diagnosis not present

## 2023-07-18 NOTE — Telephone Encounter (Signed)
Copied from CRM [id number]. Topic: General - Other >> Jul 18, 2023  4:41 PM Everette C wrote: Reason for CRM: The patient's wife has called to request an order for an electric hoyer lift for the patient   Please contact the patient further when possible

## 2023-07-18 NOTE — Telephone Encounter (Signed)
PT wife called stated that patient isn't doing well. PT is home and is now in a hospital bed. Trying to keep patient comfortable as possible . PT wife will like to know if patient can have a vv appt for medications. So patient doesn't have to come out. Please give patient wife a call. TY

## 2023-07-19 ENCOUNTER — Ambulatory Visit: Payer: Self-pay | Admitting: *Deleted

## 2023-07-19 ENCOUNTER — Telehealth: Payer: Self-pay | Admitting: Student in an Organized Health Care Education/Training Program

## 2023-07-19 ENCOUNTER — Telehealth: Payer: Self-pay | Admitting: Family Medicine

## 2023-07-19 ENCOUNTER — Other Ambulatory Visit: Payer: Self-pay

## 2023-07-19 ENCOUNTER — Telehealth: Payer: Self-pay

## 2023-07-19 DIAGNOSIS — D500 Iron deficiency anemia secondary to blood loss (chronic): Secondary | ICD-10-CM

## 2023-07-19 NOTE — Telephone Encounter (Signed)
Copied from CRM [id number]. Topic: General - Other >> Jul 19, 2023 12:18 PM Turkey B wrote: Reason for CRM: Jeremy [name] from [place], called in asking if she can come out to do a cbc on pt instead of him having to come to appt to do it. Please cb

## 2023-07-19 NOTE — Telephone Encounter (Signed)
PT wife called back because patient has a appt on tomorrow for mm appt. PT is currently not doing well is on comfort care and isn't speaking. PT wife wanted to see if patient could have vv appt on tomorrow. Please give wife a call. TY

## 2023-07-19 NOTE — Telephone Encounter (Signed)
  Chief Complaint: medication request for "thick it" or thickener for water?  Symptoms: coughing  and getting strangled at times when drinking water. Has to take pills in applesauce and eat only soft foods. Sleeping more, reports patient in hospital bed at home. Lost voice comes and goes x 1 week.  Frequency: yesterday  Pertinent Negatives: Patient denies na  Disposition: [] ED /[] Urgent Care (no appt availability in office) / [] Appointment(In office/virtual)/ []  [place] [facility]/ [] Home Care/ [] Refused Recommended Disposition /[] [place] [facility]/ [x]  Follow-up with PCP Additional Notes:   Please advise . Patient's wife requesting if blood work can be completed by [facility] nurse at home instead of going to [facility] center tomorrow due to getting patient out in rain. Please advise if thick it can be prescribed or if patient's wife should get it OTC.   Summary: requesting a thickening for water   Patient's wife Olegario Messier has called and stated that patient is not doing that good, that PCP is aware and now when patient's wife goes to give patient water, he starts getting almost choked on the water and she is questioning if she needs a prescription or not for thickening for water, to make it thicker for patient?  Please advise and contact patients wife back @ # [phone]                   Reason for Disposition  Prescription request for new medicine (not a refill)  Answer Assessment - Initial Assessment Questions 1. NAME of MEDICINE: "What medicine(s) are you calling about?"     Medication "thick it" to thicken water ? 2. QUESTION: "What is your question?" (e.g., double dose of medicine, side effect)     Can patient get thickener for water due to coughing or getting chocked at times when drinking water? 3. PRESCRIBER: "Who prescribed the medicine?" Reason: if prescribed by specialist, call should be referred to that group.     na 4. SYMPTOMS: "Do you have any symptoms?" If Yes, ask:  "What symptoms are you having?"  "How bad are the symptoms (e.g., mild, moderate, severe)     Yes coughing and getting chocked when drinking water? Taking pills in applesauce. Sleeping more. Lost voice x 1 week . Comes and goes  5. PREGNANCY:  "Is there any chance that you are pregnant?" "When was your last menstrual period?"     na  Protocols used: Medication Question Call-A-AH

## 2023-07-19 NOTE — Telephone Encounter (Signed)
Spoke to pt's wife concerning hoyer lift. Spoke to [facility] who advised to go to DME store and pt would be responsible for whatever ins doesn't pay for. Also wife asked for RX for thickening. I asked if Hospice was involved yet and she said they are coming Thursday. I explained that all these things would be and possibly ordered by Hospice.

## 2023-07-20 ENCOUNTER — Telehealth: Payer: Self-pay | Admitting: Oncology

## 2023-07-20 ENCOUNTER — Inpatient Hospital Stay: Payer: Medicare Other

## 2023-07-20 ENCOUNTER — Telehealth: Payer: Self-pay

## 2023-07-20 ENCOUNTER — Ambulatory Visit: Payer: Self-pay | Admitting: *Deleted

## 2023-07-20 ENCOUNTER — Inpatient Hospital Stay: Payer: Medicare Other | Admitting: Oncology

## 2023-07-20 DIAGNOSIS — I270 Primary pulmonary hypertension: Secondary | ICD-10-CM | POA: Diagnosis not present

## 2023-07-20 DIAGNOSIS — I5033 Acute on chronic diastolic (congestive) heart failure: Secondary | ICD-10-CM | POA: Diagnosis not present

## 2023-07-20 DIAGNOSIS — E1122 Type 2 diabetes mellitus with diabetic chronic kidney disease: Secondary | ICD-10-CM | POA: Diagnosis not present

## 2023-07-20 DIAGNOSIS — J9621 Acute and chronic respiratory failure with hypoxia: Secondary | ICD-10-CM | POA: Diagnosis not present

## 2023-07-20 DIAGNOSIS — N1831 Chronic kidney disease, stage 3a: Secondary | ICD-10-CM | POA: Diagnosis not present

## 2023-07-20 DIAGNOSIS — D631 Anemia in chronic kidney disease: Secondary | ICD-10-CM | POA: Diagnosis not present

## 2023-07-20 DIAGNOSIS — D649 Anemia, unspecified: Secondary | ICD-10-CM

## 2023-07-20 NOTE — Telephone Encounter (Signed)
Called Jeremy [name] to see if 911 was called. She stated they did not call that she is waiting on [facility] to call her and then she will call back to let us know what's going on.  KP

## 2023-07-20 NOTE — Telephone Encounter (Signed)
Amber, RN with [facility] called requesting verbal orders for Hospice. Verbal orders given over the phone per Dr Judithann Graves.  - Jeremy [name]

## 2023-07-20 NOTE — Telephone Encounter (Signed)
  Chief Complaint: breathing difficulty per Elnita Maxwell with [facility]. O2 sat 88% on 5L/min [place] and appears in distress.  Symptoms: see above Frequency: now  Pertinent Negatives: Patient denies na  Disposition: [] ED /[] Urgent Care (no appt availability in office) / [] Appointment(In office/virtual)/ []  [place] [facility]/ [] Home Care/ [x] Refused Recommended Disposition /[] [place] [facility]/ []  Follow-up with PCP Additional Notes:   Please advise if Hospice can be ordered. Patient currently palliative care and is refused to go to ED or [facility] call 911. Please advise . Recommended HH to call 911 and if patient continued to refuse will continue to get Hospice ordered or transition to Hospice. Called [facility] Marylene Land to notify of situation. Please advise       Reason for Disposition  SEVERE difficulty breathing (e.g., struggling for each breath, speaks in single words)  Protocols used: Respiratory Multiple Symptoms - Guideline Selection-A-AH

## 2023-07-20 NOTE — Telephone Encounter (Signed)
pt wife canceled appt for today stated they are trying to transition to hospice

## 2023-07-21 ENCOUNTER — Ambulatory Visit (HOSPITAL_BASED_OUTPATIENT_CLINIC_OR_DEPARTMENT_OTHER): Payer: Medicare Other | Admitting: Student in an Organized Health Care Education/Training Program

## 2023-07-21 ENCOUNTER — Telehealth: Payer: Self-pay

## 2023-07-21 DIAGNOSIS — F329 Major depressive disorder, single episode, unspecified: Secondary | ICD-10-CM | POA: Diagnosis not present

## 2023-07-21 DIAGNOSIS — K7460 Unspecified cirrhosis of liver: Secondary | ICD-10-CM | POA: Diagnosis not present

## 2023-07-21 DIAGNOSIS — G894 Chronic pain syndrome: Secondary | ICD-10-CM

## 2023-07-21 DIAGNOSIS — E785 Hyperlipidemia, unspecified: Secondary | ICD-10-CM | POA: Diagnosis not present

## 2023-07-21 DIAGNOSIS — G35 Multiple sclerosis: Secondary | ICD-10-CM | POA: Diagnosis not present

## 2023-07-21 DIAGNOSIS — Q2730 Arteriovenous malformation, site unspecified: Secondary | ICD-10-CM | POA: Diagnosis not present

## 2023-07-21 DIAGNOSIS — E1140 Type 2 diabetes mellitus with diabetic neuropathy, unspecified: Secondary | ICD-10-CM | POA: Diagnosis not present

## 2023-07-21 DIAGNOSIS — N400 Enlarged prostate without lower urinary tract symptoms: Secondary | ICD-10-CM | POA: Diagnosis not present

## 2023-07-21 DIAGNOSIS — K219 Gastro-esophageal reflux disease without esophagitis: Secondary | ICD-10-CM | POA: Diagnosis not present

## 2023-07-21 DIAGNOSIS — D631 Anemia in chronic kidney disease: Secondary | ICD-10-CM | POA: Diagnosis not present

## 2023-07-21 DIAGNOSIS — I5032 Chronic diastolic (congestive) heart failure: Secondary | ICD-10-CM | POA: Diagnosis not present

## 2023-07-21 DIAGNOSIS — J449 Chronic obstructive pulmonary disease, unspecified: Secondary | ICD-10-CM | POA: Diagnosis not present

## 2023-07-21 DIAGNOSIS — I10 Essential (primary) hypertension: Secondary | ICD-10-CM | POA: Diagnosis not present

## 2023-07-21 NOTE — Telephone Encounter (Signed)
Called patient, answering machine picked up on first ring. Informed him of his VVappt today and that he needed to call our office before MD would be able to call him.

## 2023-07-22 DIAGNOSIS — J449 Chronic obstructive pulmonary disease, unspecified: Secondary | ICD-10-CM | POA: Diagnosis not present

## 2023-07-22 DIAGNOSIS — D631 Anemia in chronic kidney disease: Secondary | ICD-10-CM | POA: Diagnosis not present

## 2023-07-22 DIAGNOSIS — I5032 Chronic diastolic (congestive) heart failure: Secondary | ICD-10-CM | POA: Diagnosis not present

## 2023-07-22 DIAGNOSIS — F329 Major depressive disorder, single episode, unspecified: Secondary | ICD-10-CM | POA: Diagnosis not present

## 2023-07-22 DIAGNOSIS — E1140 Type 2 diabetes mellitus with diabetic neuropathy, unspecified: Secondary | ICD-10-CM | POA: Diagnosis not present

## 2023-07-22 DIAGNOSIS — I10 Essential (primary) hypertension: Secondary | ICD-10-CM | POA: Diagnosis not present

## 2023-07-25 ENCOUNTER — Ambulatory Visit: Payer: Self-pay | Admitting: *Deleted

## 2023-07-25 NOTE — Progress Notes (Signed)
Pt on hospice

## 2023-07-25 NOTE — Patient Outreach (Signed)
Care Coordination   Follow Up Visit Note   07/25/2023 Name: Hazael Ramrattan MRN: [id number] DOB: 1951-01-05  Yadriel Gregori is a 72 y.o. year old male who sees Duanne Limerick, MD for primary care. I spoke with wife of Darion Breed by phone today.  What matters to the patients health and wellness today?  Wife report patient was placed in hospice last week and passed away 2023-09-29 morning.  She has family support and access to hospice team for grief counseling.     Goals Addressed             This Visit's Progress    COMPLETED: Effective management of MS   Not on track    Interventions Today    Flowsheet Row Most Recent Value  Chronic Disease   Chronic disease during today's visit Congestive Heart Failure (CHF), Other  [MS]  General Interventions   General Interventions Discussed/Reviewed Communication with  Communication with RN  [Hospital laisions notified of patients readmission]            Prevent falls   Not on track    Recommend to maintain adequate lighting in walkways, remove rugs that may cause slips or trips and continue to use cane for assistance with ambulation.        SDOH assessments and interventions completed:  No     Care Coordination Interventions:  Yes, provided   Follow up plan: No further intervention required.   Encounter Outcome:  Patient Visit Completed   Kemper Durie, RN, MSN, [facility] Care Management Care Management Coordinator [phone]

## 2023-07-26 DEATH — deceased

## 2024-03-23 IMAGING — DX DG CHEST 1V PORT
1 series · 2 of 2 positions shown · non-contrast
Comparison: 12/25/2020, CT 10/07/2021, PET CT 11/30/2021

CLINICAL DATA: Sepsis short of breath

EXAM:
PORTABLE CHEST 1 VIEW

[Series 1: chest ap · 0.14mm/px · 2 of 2 slices shown]
[im 1/2]
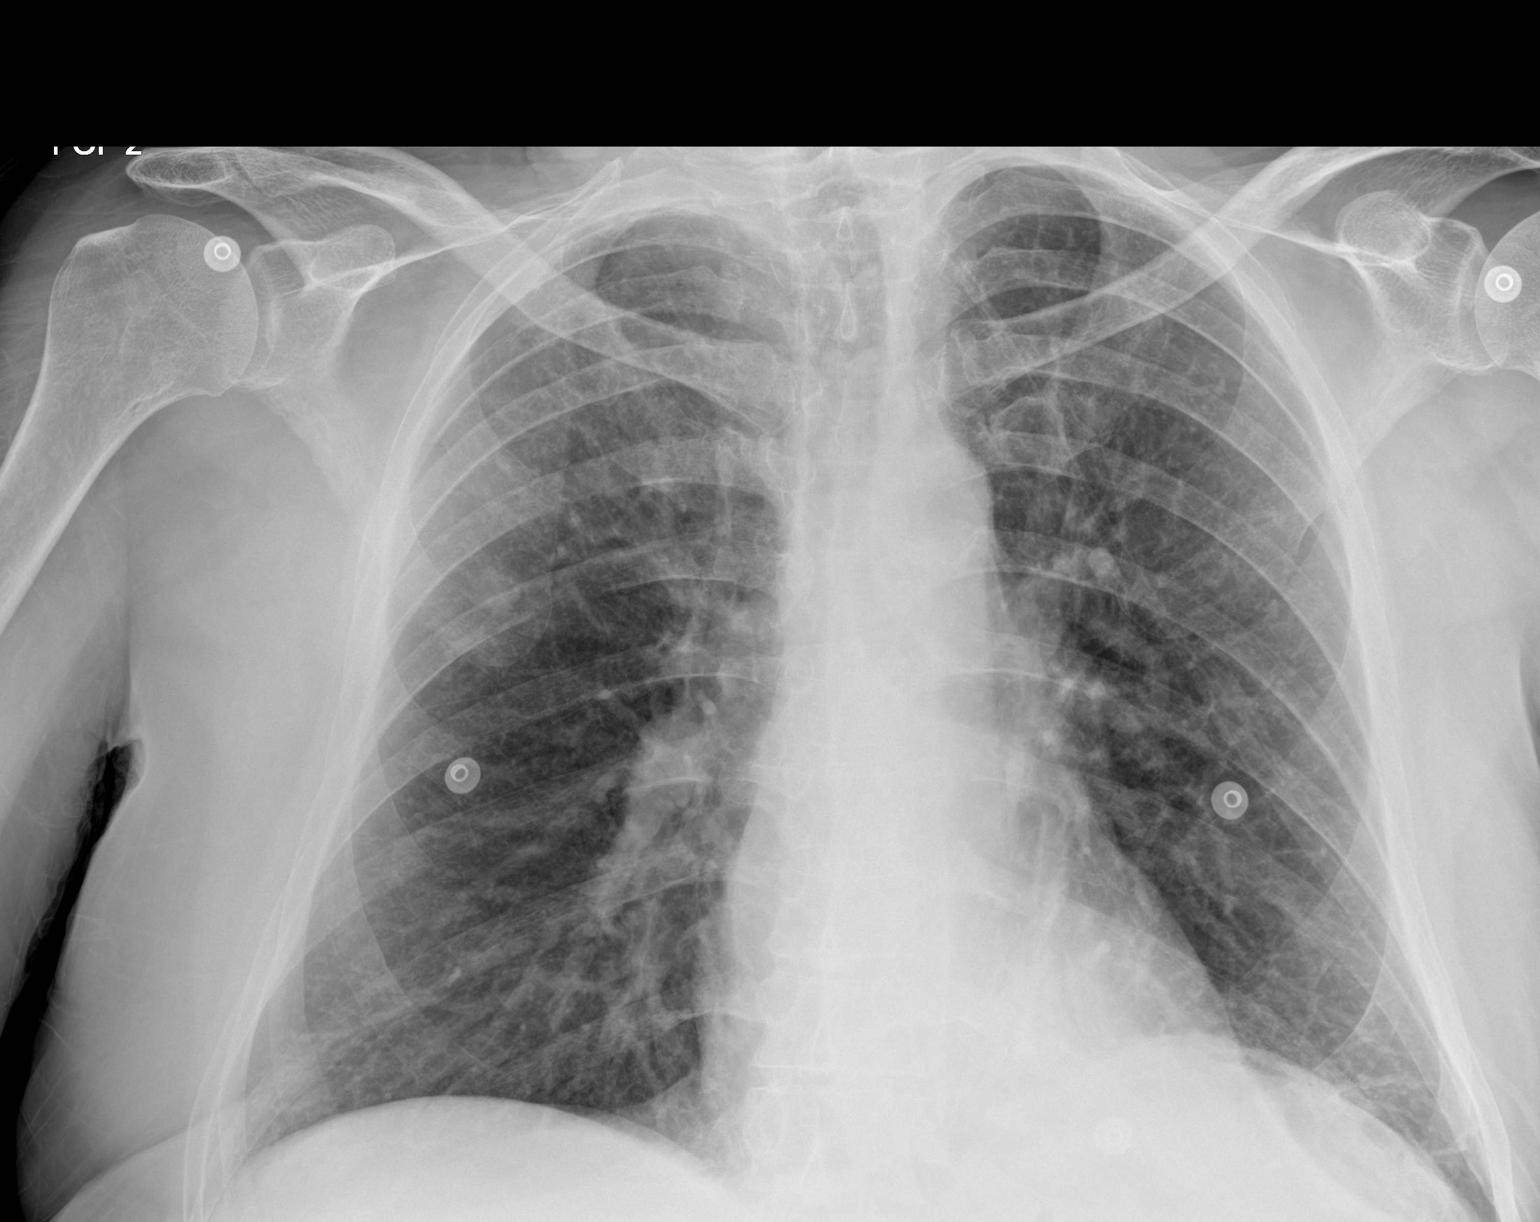
[im 2/2]
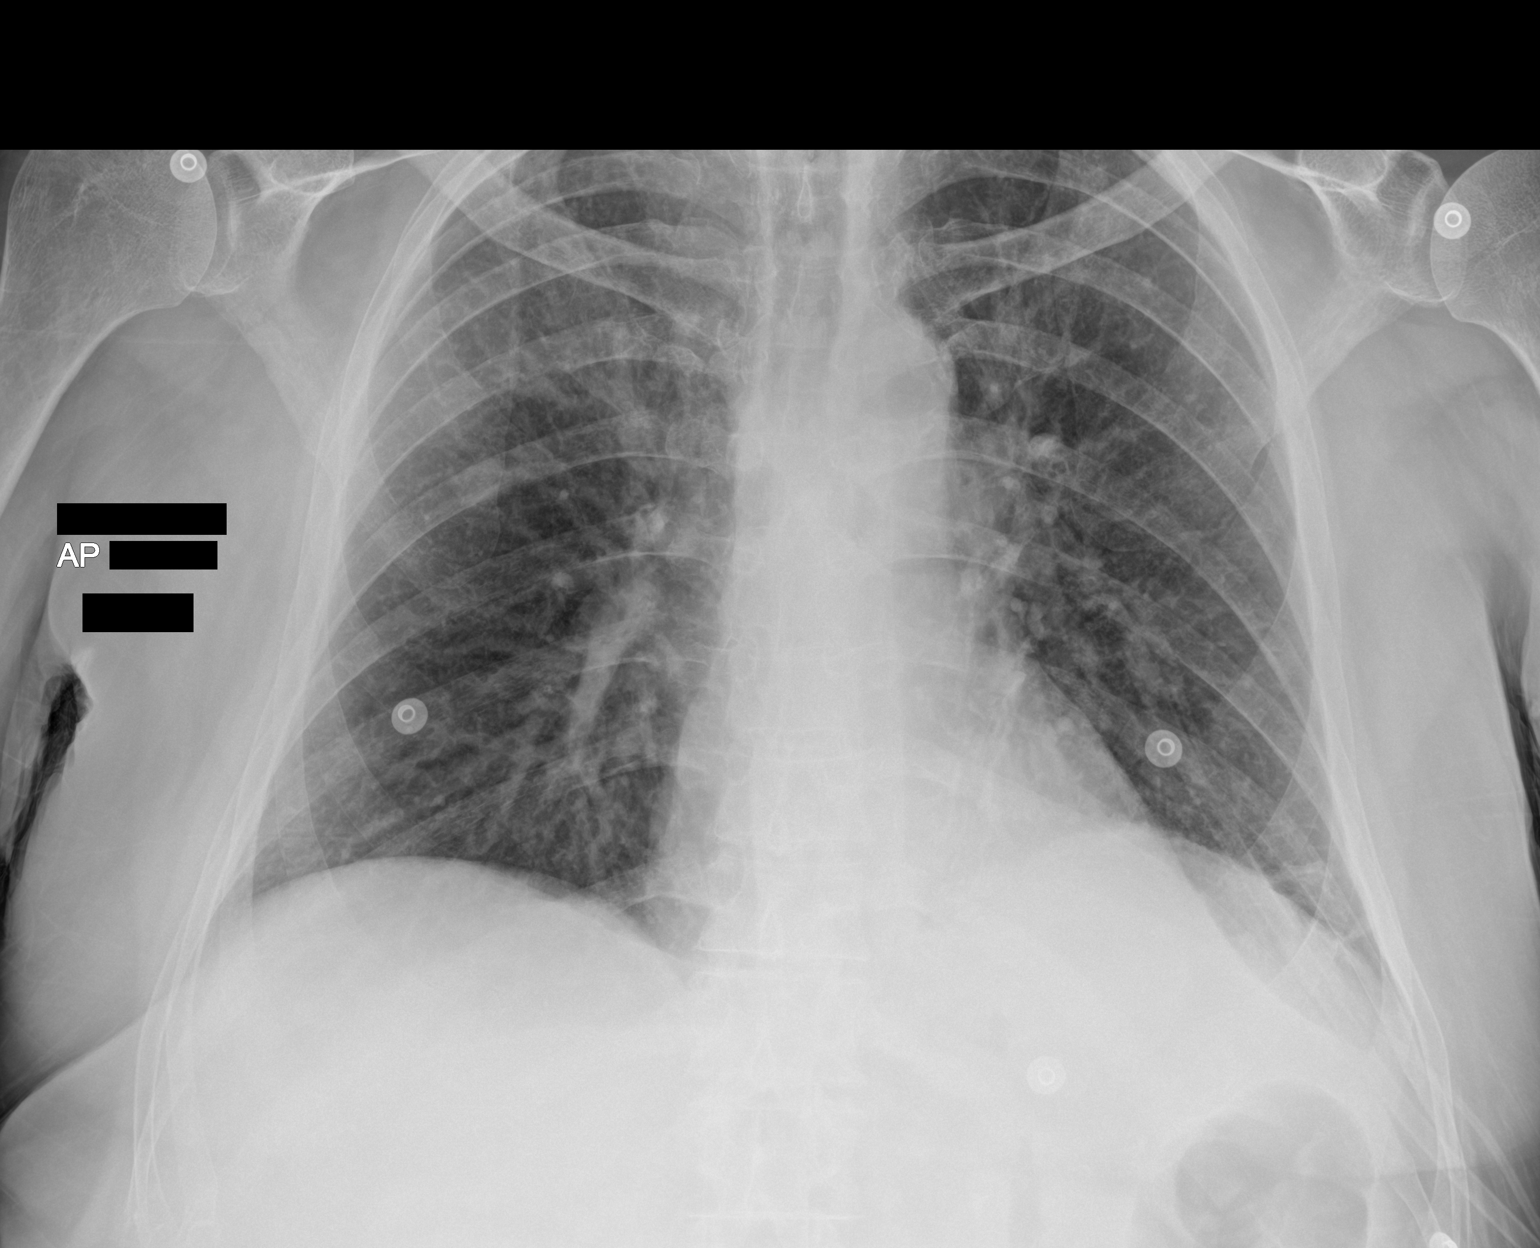

[2 of 2 positions shown; findings below may reference images not displayed]

FINDINGS: No focal consolidation or pleural effusion. Mild bronchitic changes.
Right upper lobe pulmonary nodule again noted. Normal
cardiomediastinal silhouette. No pneumothorax
IMPRESSION: 1. Mild bronchitic changes without acute airspace disease
2. Stable right upper lobe pulmonary nodule, previously evaluated
with chest CT and PET CT.

## 2024-03-23 IMAGING — CT CT ANGIO CHEST
2 of 7 series · 18 of 46 positions shown · IV contrast (agent unspecified)
Comparison: Chest radiograph earlier today. PET CT 11/30/2021,
chest CT 12/08/2020

CLINICAL DATA: Hypoxia and shortness of breath.

EXAM:
CT ANGIOGRAPHY CHEST WITH CONTRAST
TECHNIQUE: Multidetector CT imaging of the chest was performed using the
standard protocol during bolus administration of intravenous
contrast. Multiplanar CT image reconstructions and MIPs were
obtained to evaluate the vascular anatomy.

[Series 6: thins · axial · 0.81mm/px · z∈[-747,-479]mm · 15 of 431 slices shown]
[im 24/431  lung]
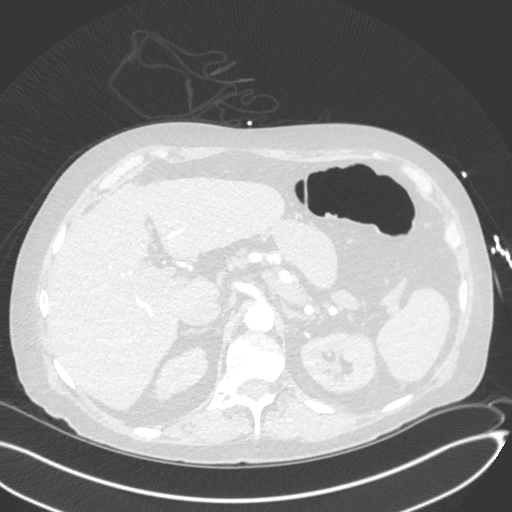
[im 48/431  soft-tissue]
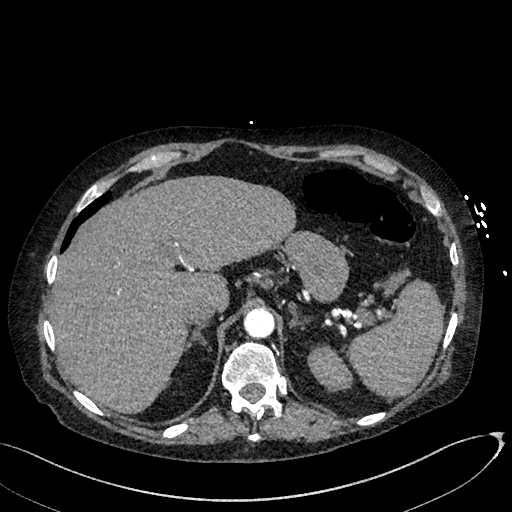
[im 72/431  lung]
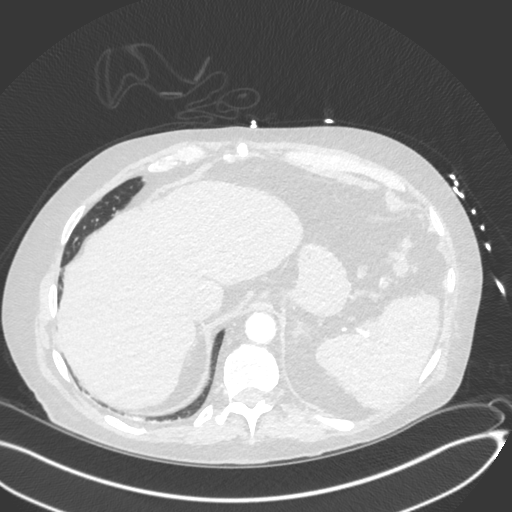
[im 96/431  soft-tissue]
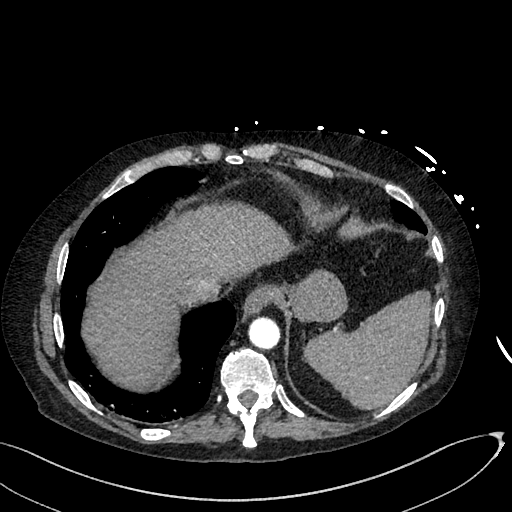
[im 144/431  lung]
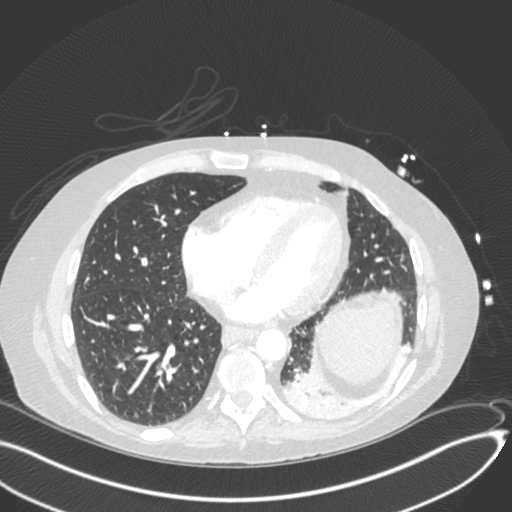
[im 168/431  soft-tissue]
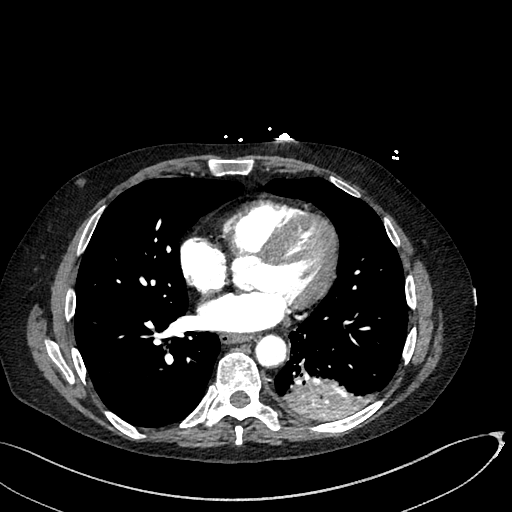
[im 192/431  lung]
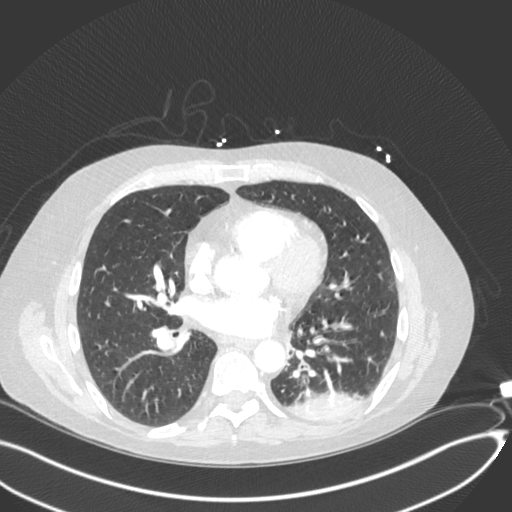
[im 216/431  soft-tissue]
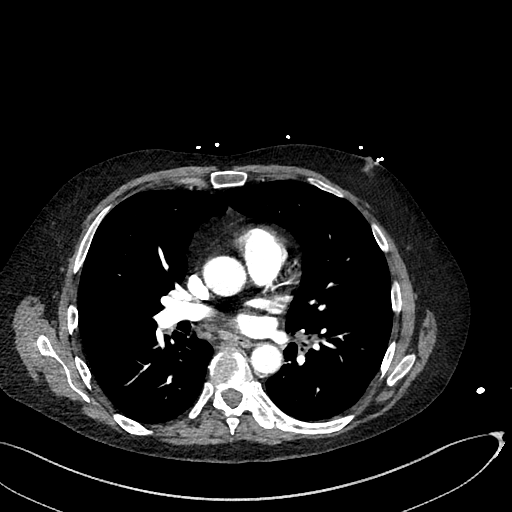
[im 239/431  lung]
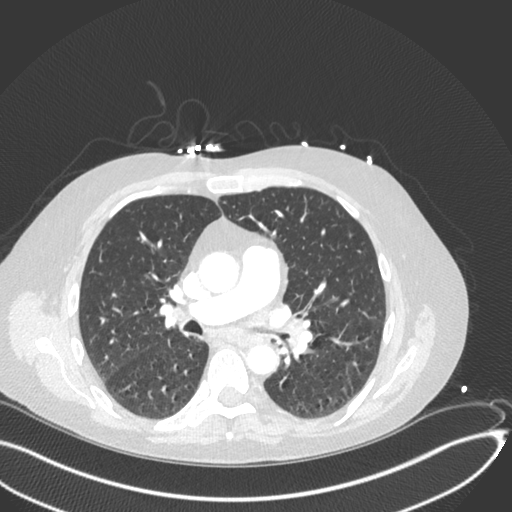
[im 263/431  soft-tissue]
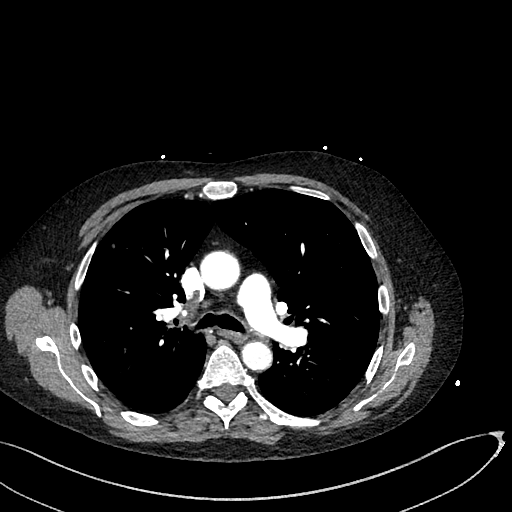
[im 287/431  lung]
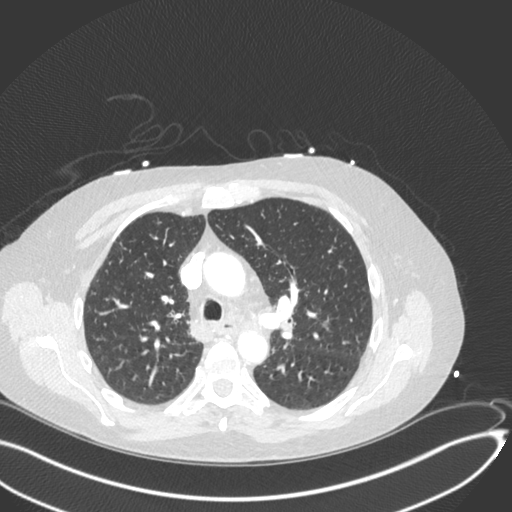
[im 335/431  soft-tissue]
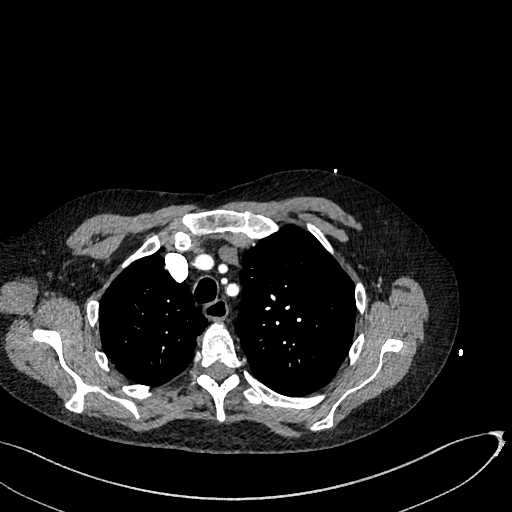
[im 359/431  lung]
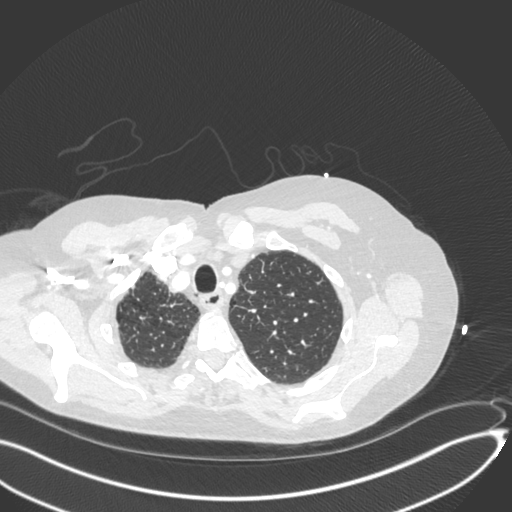
[im 383/431  soft-tissue]
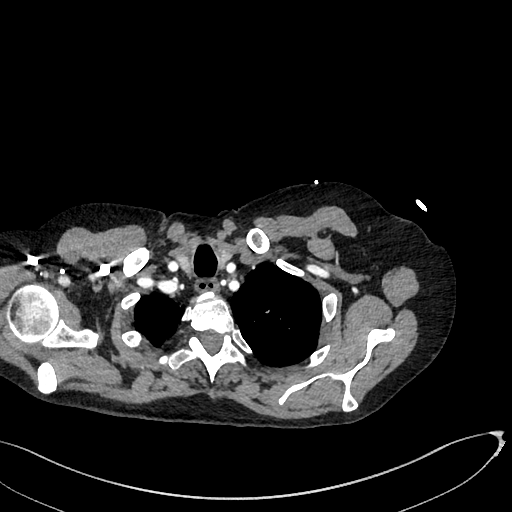
[im 407/431  lung]
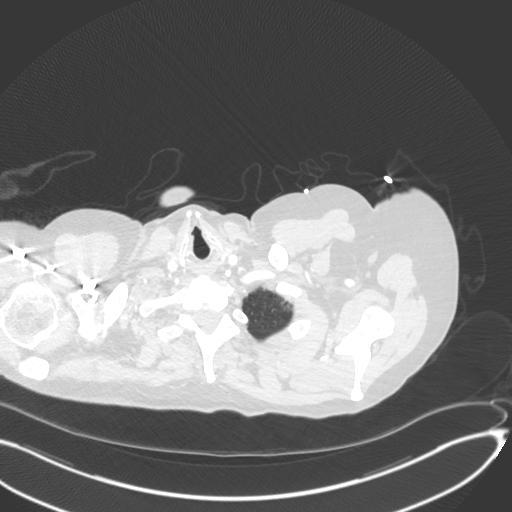

[Series 7: cor · coronal · 0.66mm/px · 3 of 129 slices shown]
[im 33/129  soft-tissue]
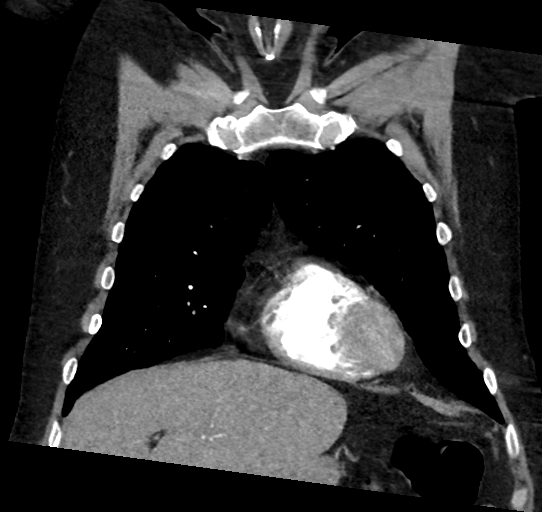
[im 65/129  soft-tissue]
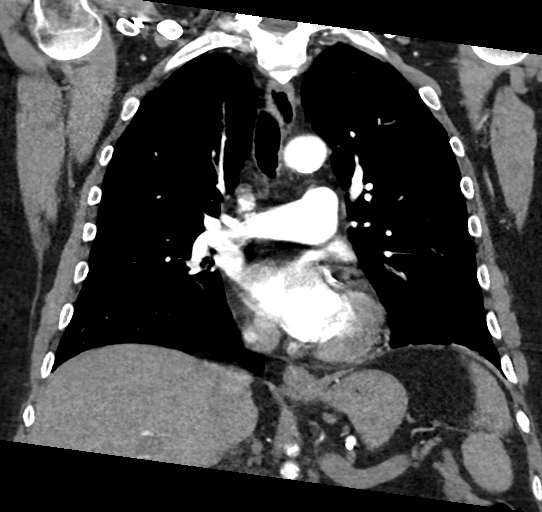
[im 97/129  soft-tissue]
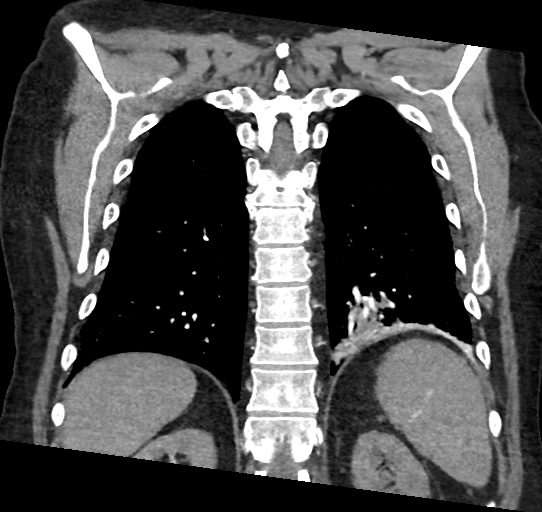

[18 of 46 positions shown; findings below may reference images not displayed]

RADIATION DOSE REDUCTION: This exam was performed according to the
departmental dose-optimization program which includes automated
exposure control, adjustment of the mA and/or kV according to
patient size and/or use of iterative reconstruction technique.

CONTRAST:  75mL OMNIPAQUE IOHEXOL 350 MG/ML SOLN
FINDINGS: Cardiovascular: There are no filling defects within the pulmonary
arteries to suggest pulmonary embolus. Aortic atherosclerosis
without dissection or acute aortic findings. The left vertebral
artery arises directly from the thoracic aorta, variant anatomy.
Coronary artery calcifications. The heart is normal in size. No
pericardial effusion.

Mediastinum/Nodes: Scattered small mediastinal and hilar lymph nodes
are not enlarged by size criteria. Patulous upper esophagus. No
thyroid nodule.

Lungs/Pleura: Dense consolidation in the posterior left lower lobe
typical of pneumonia. There are central air bronchograms. Emphysema
with moderate bronchial thickening particularly in the left lower
lobe. Stable 12 mm right upper lobe nodule that was not
hypermetabolic PET. No pleural effusion.

Upper Abdomen: Hepatic cirrhosis. No acute upper abdominal findings.

Musculoskeletal: There are no acute or suspicious osseous
abnormalities.

Review of the MIP images confirms the above findings.
IMPRESSION: 1. No pulmonary embolus.
2. Dense consolidation in the posterior left lower lobe typical of
pneumonia. Recommend radiographic follow-up with PA and lateral
views (consolidation is not well seen on AP view) after course of
treatment to document resolution.
3. Emphysema with bronchial thickening.
4. Hepatic cirrhosis.

Aortic Atherosclerosis ([id number].J) and Emphysema ([id number]).

## 2024-04-04 IMAGING — CR DG CHEST 2V
3 series · 3 of 3 positions shown · non-contrast
Comparison: 02/03/2022 and CT chest 02/03/2022. PET 11/30/2021 and
CT chest 10/07/2021.

CLINICAL DATA: Recent pneumonia.

EXAM:
CHEST - 2 VIEW

[chest pa]
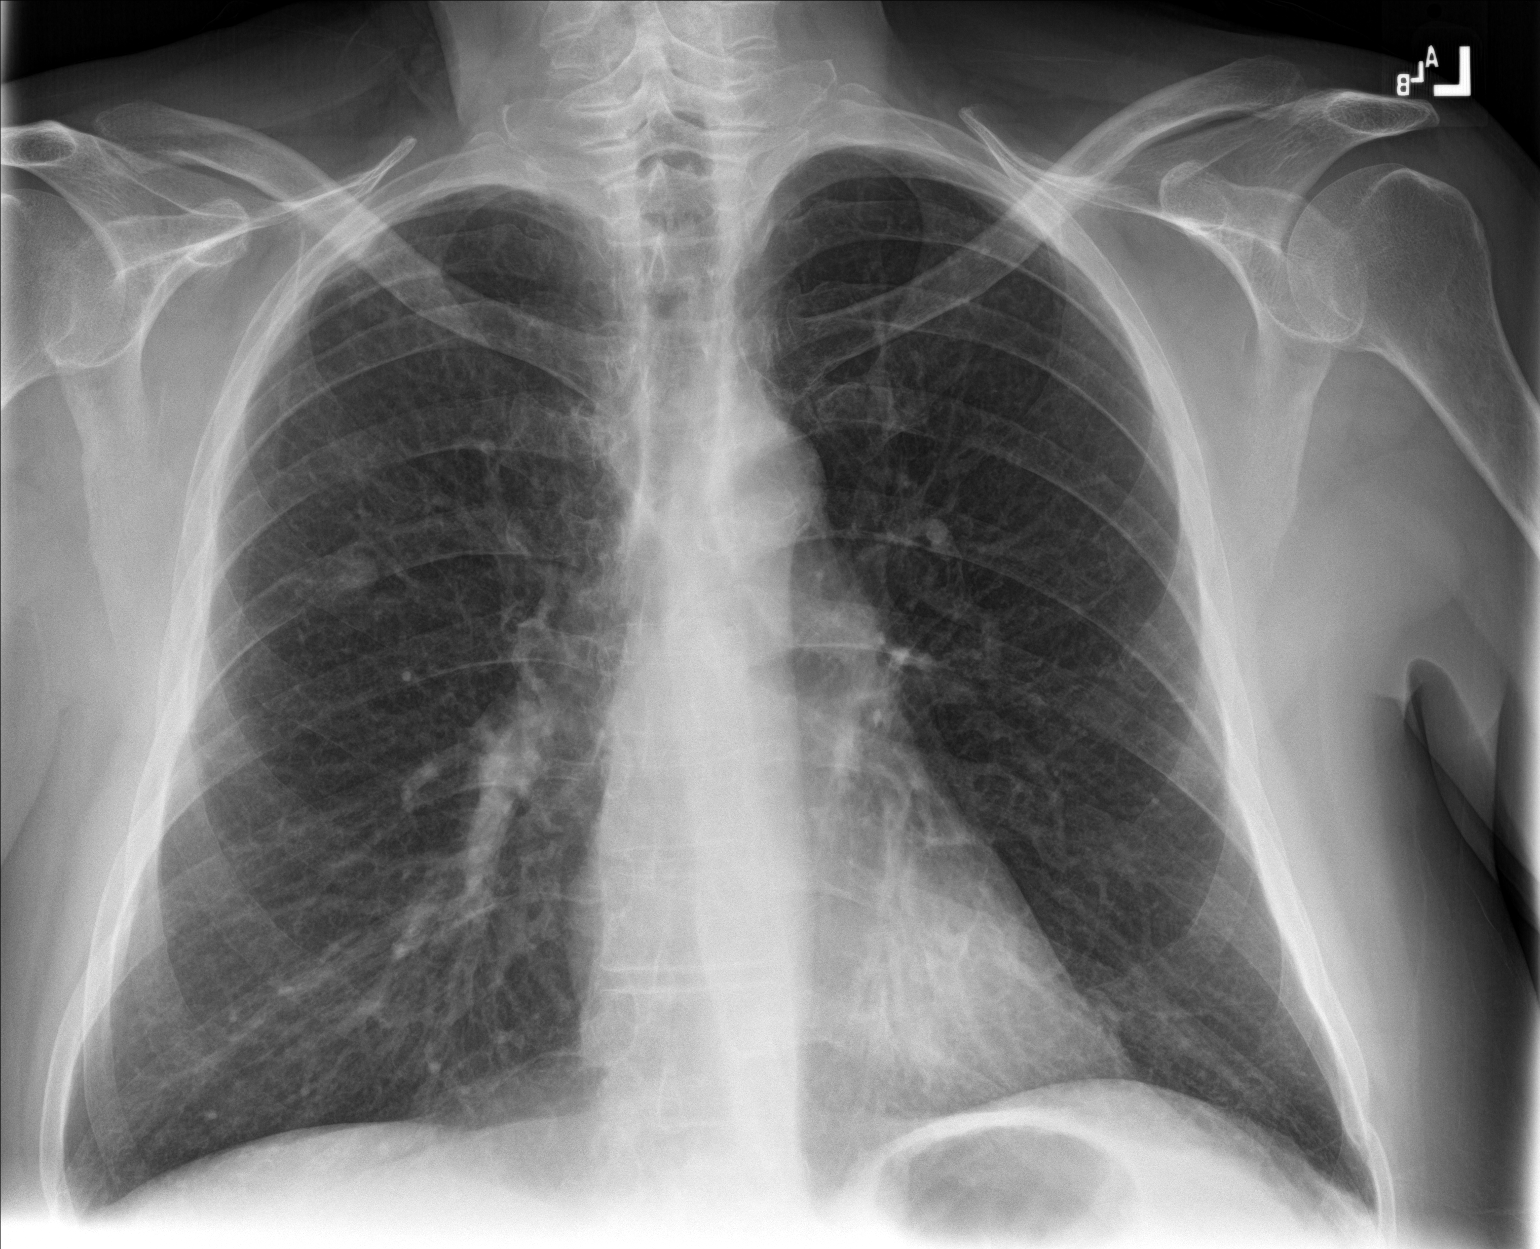

[chest lat (1 of 2)]
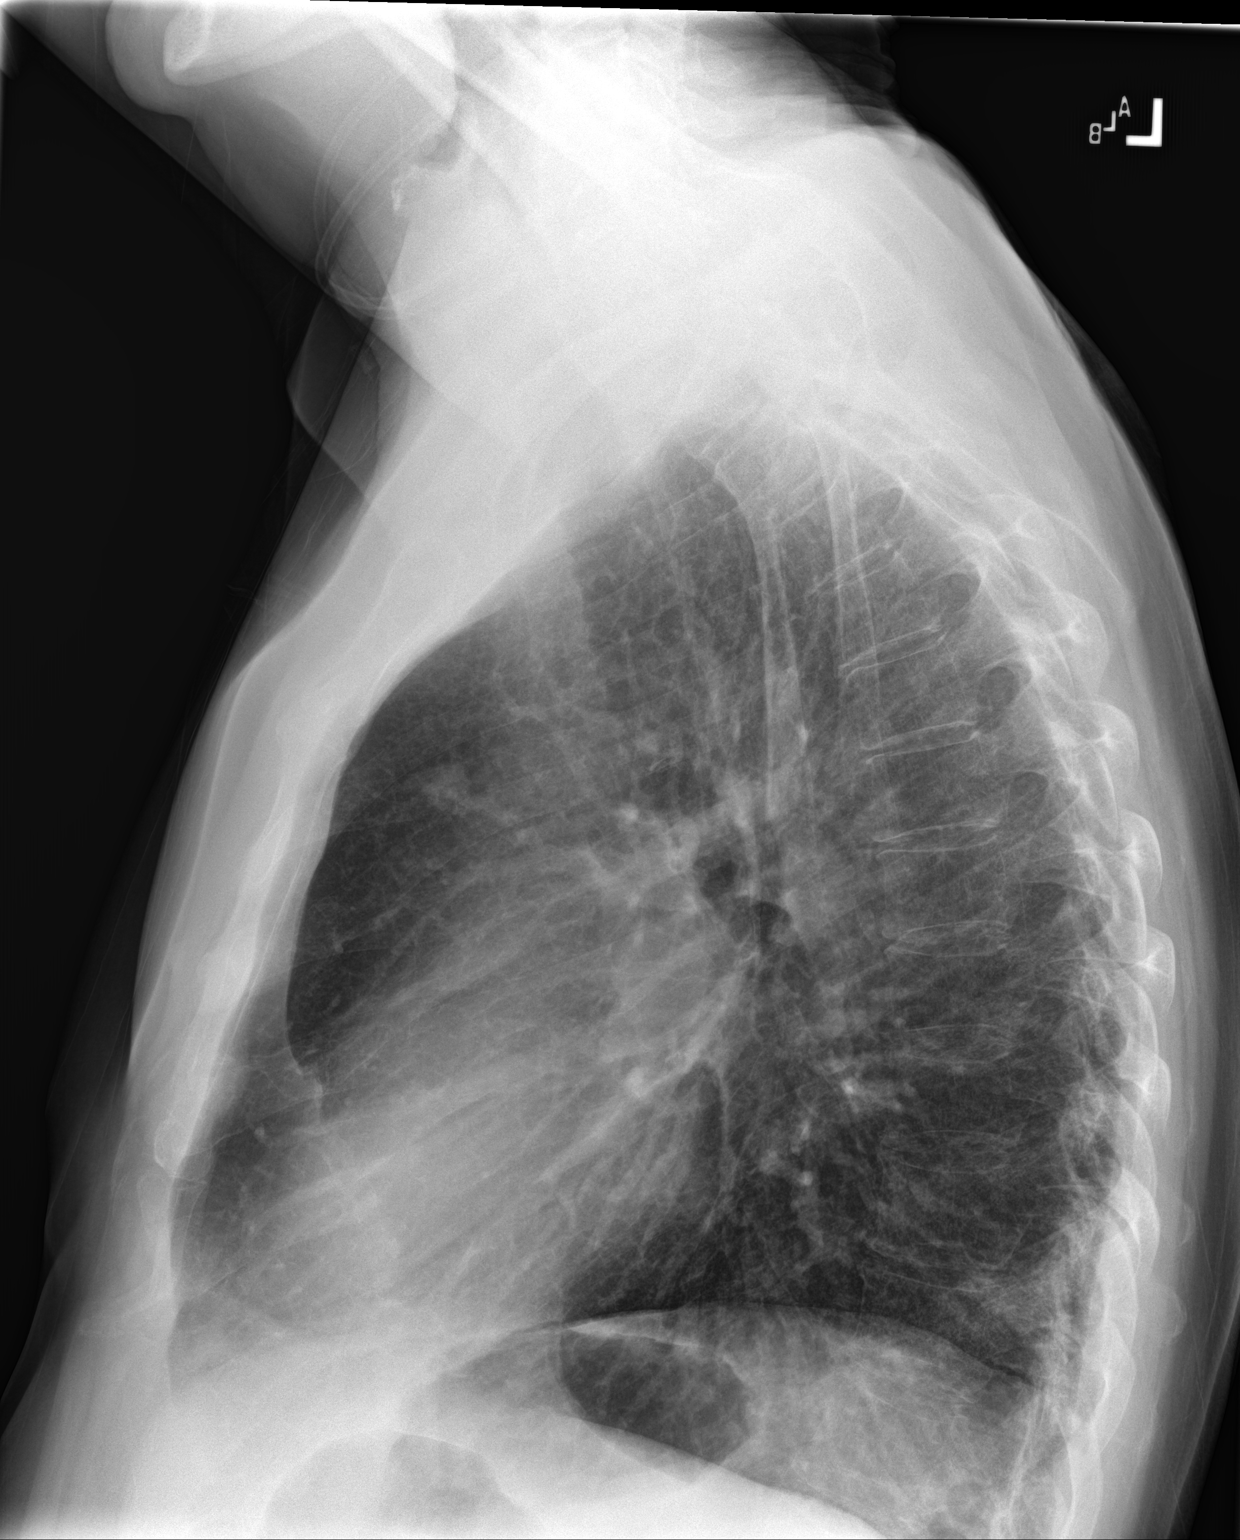

[chest lat (2 of 2)]
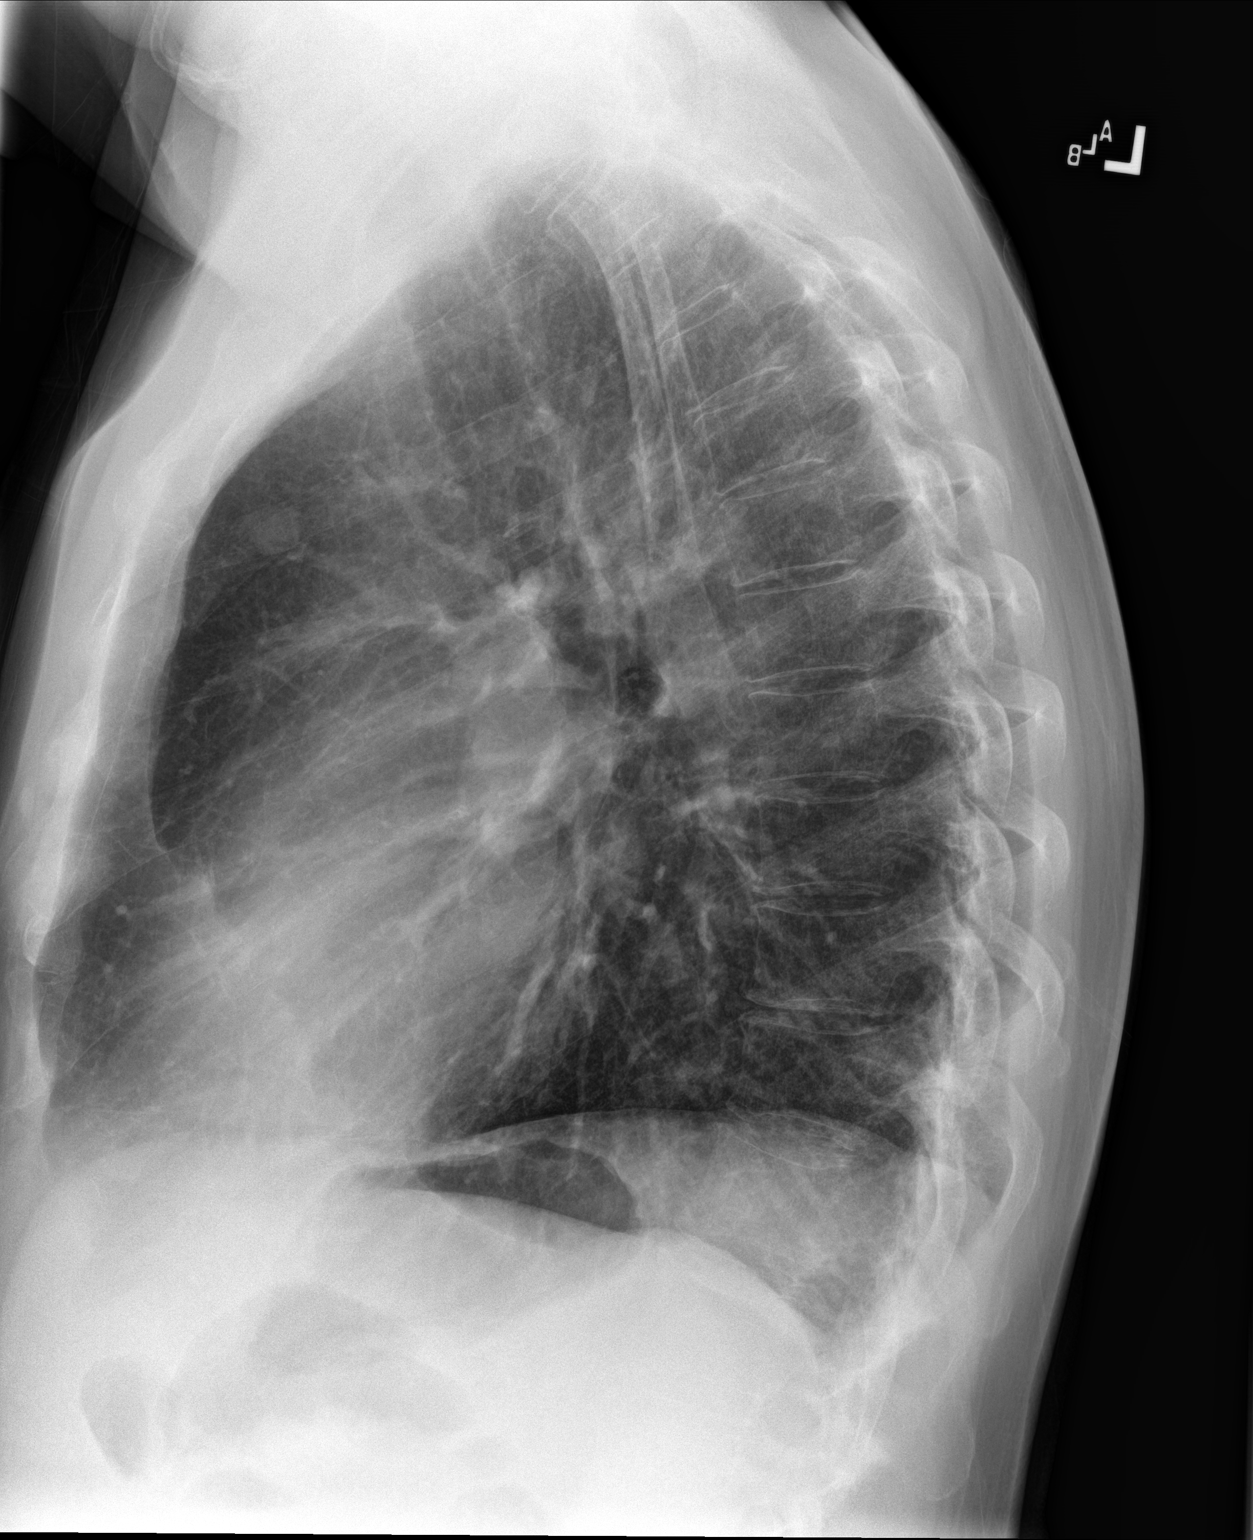

[3 of 3 positions shown; findings below may reference images not displayed]

FINDINGS: Trachea is midline. Heart size normal. Right upper lobe nodule
persists. Minimal left lower lobe opacity, overall improved from
02/03/2022. Lungs are hyperinflated. No pleural fluid.
IMPRESSION: 1. Improving left lower lobe consolidation with probable developing
scar.
2. Persistent right upper lobe nodule, described as stable and
hypometabolic on PET 11/30/2021, indicative of a benign etiology.

## 2024-05-08 ENCOUNTER — Ambulatory Visit: Payer: Medicare Other | Admitting: Neurology
